# Patient Record
Sex: Male | Born: 1945 | Race: White | Hispanic: No | Smoking: Former smoker
Health system: Southern US, Community
[De-identification: ages and names within clinical notes are randomized; demographics above are authoritative.]

## PROBLEM LIST (undated history)

## (undated) DIAGNOSIS — I10 Essential (primary) hypertension: Secondary | ICD-10-CM

## (undated) DIAGNOSIS — E119 Type 2 diabetes mellitus without complications: Secondary | ICD-10-CM

## (undated) DIAGNOSIS — E785 Hyperlipidemia, unspecified: Secondary | ICD-10-CM

## (undated) DIAGNOSIS — F32A Depression, unspecified: Secondary | ICD-10-CM

## (undated) DIAGNOSIS — Z7901 Long term (current) use of anticoagulants: Secondary | ICD-10-CM

## (undated) DIAGNOSIS — E11621 Type 2 diabetes mellitus with foot ulcer: Secondary | ICD-10-CM

## (undated) DIAGNOSIS — I509 Heart failure, unspecified: Secondary | ICD-10-CM

## (undated) DIAGNOSIS — D126 Benign neoplasm of colon, unspecified: Secondary | ICD-10-CM

## (undated) DIAGNOSIS — G2581 Restless legs syndrome: Secondary | ICD-10-CM

## (undated) DIAGNOSIS — F4310 Post-traumatic stress disorder, unspecified: Secondary | ICD-10-CM

## (undated) DIAGNOSIS — G459 Transient cerebral ischemic attack, unspecified: Secondary | ICD-10-CM

## (undated) DIAGNOSIS — N400 Enlarged prostate without lower urinary tract symptoms: Secondary | ICD-10-CM

## (undated) DIAGNOSIS — I4891 Unspecified atrial fibrillation: Secondary | ICD-10-CM

## (undated) DIAGNOSIS — Z7902 Long term (current) use of antithrombotics/antiplatelets: Secondary | ICD-10-CM

## (undated) DIAGNOSIS — I639 Cerebral infarction, unspecified: Secondary | ICD-10-CM

## (undated) DIAGNOSIS — F329 Major depressive disorder, single episode, unspecified: Secondary | ICD-10-CM

## (undated) DIAGNOSIS — Z794 Long term (current) use of insulin: Secondary | ICD-10-CM

## (undated) DIAGNOSIS — G629 Polyneuropathy, unspecified: Secondary | ICD-10-CM

## (undated) HISTORY — PX: SHOULDER ARTHROSCOPY: SHX128

## (undated) HISTORY — DX: Cerebral infarction, unspecified: I63.9

## (undated) HISTORY — PX: BYPASS GRAFT: SHX909

## (undated) HISTORY — PX: TRIGGER FINGER RELEASE: SHX641

## (undated) HISTORY — DX: Polyneuropathy, unspecified: G62.9

## (undated) HISTORY — DX: Post-traumatic stress disorder, unspecified: F43.10

## (undated) HISTORY — PX: CORONARY ANGIOPLASTY: SHX604

## (undated) HISTORY — PX: UVULOPALATOPHARYNGOPLASTY, TONSILLECTOMY AND SEPTOPLASTY: SHX2632

---

## 2013-05-01 ENCOUNTER — Ambulatory Visit: Payer: Self-pay | Admitting: Pain Medicine

## 2013-05-15 ENCOUNTER — Ambulatory Visit: Payer: Self-pay | Admitting: Pain Medicine

## 2015-11-13 ENCOUNTER — Emergency Department: Payer: Medicare Other

## 2015-11-13 ENCOUNTER — Encounter: Payer: Self-pay | Admitting: Emergency Medicine

## 2015-11-13 ENCOUNTER — Emergency Department
Admission: EM | Admit: 2015-11-13 | Discharge: 2015-11-13 | Disposition: A | Discharge status: Home / Self Care | Payer: Medicare Other | Attending: Emergency Medicine | Admitting: Emergency Medicine

## 2015-11-13 DIAGNOSIS — R079 Chest pain, unspecified: Secondary | ICD-10-CM | POA: Diagnosis present

## 2015-11-13 DIAGNOSIS — Z794 Long term (current) use of insulin: Secondary | ICD-10-CM | POA: Insufficient documentation

## 2015-11-13 DIAGNOSIS — Z7982 Long term (current) use of aspirin: Secondary | ICD-10-CM | POA: Insufficient documentation

## 2015-11-13 DIAGNOSIS — J9811 Atelectasis: Secondary | ICD-10-CM | POA: Insufficient documentation

## 2015-11-13 DIAGNOSIS — E119 Type 2 diabetes mellitus without complications: Secondary | ICD-10-CM | POA: Diagnosis not present

## 2015-11-13 DIAGNOSIS — Z7984 Long term (current) use of oral hypoglycemic drugs: Secondary | ICD-10-CM | POA: Diagnosis not present

## 2015-11-13 DIAGNOSIS — Z79899 Other long term (current) drug therapy: Secondary | ICD-10-CM | POA: Insufficient documentation

## 2015-11-13 DIAGNOSIS — R51 Headache: Secondary | ICD-10-CM | POA: Diagnosis not present

## 2015-11-13 DIAGNOSIS — Z7952 Long term (current) use of systemic steroids: Secondary | ICD-10-CM | POA: Insufficient documentation

## 2015-11-13 DIAGNOSIS — I639 Cerebral infarction, unspecified: Secondary | ICD-10-CM

## 2015-11-13 DIAGNOSIS — I10 Essential (primary) hypertension: Secondary | ICD-10-CM | POA: Diagnosis not present

## 2015-11-13 DIAGNOSIS — R519 Headache, unspecified: Secondary | ICD-10-CM

## 2015-11-13 HISTORY — DX: Cerebral infarction, unspecified: I63.9

## 2015-11-13 HISTORY — DX: Essential (primary) hypertension: I10

## 2015-11-13 HISTORY — DX: Type 2 diabetes mellitus without complications: E11.9

## 2015-11-13 LAB — URINALYSIS COMPLETE WITH MICROSCOPIC (ARMC ONLY)
Bilirubin Urine: NEGATIVE
Glucose, UA: >500 mg/dL — AB
HGB URINE DIPSTICK: NEGATIVE
LEUKOCYTES UA: NEGATIVE
NITRITE: NEGATIVE
PH: 5 (ref 5.0–8.0)
PROTEIN: 100 mg/dL — AB
SPECIFIC GRAVITY, URINE: 1.03 (ref 1.005–1.030)
Squamous Epithelial / LPF: NONE SEEN

## 2015-11-13 LAB — BASIC METABOLIC PANEL
ANION GAP: 12 (ref 5–15)
BUN: 21 mg/dL — ABNORMAL HIGH (ref 6–20)
CHLORIDE: 108 mmol/L (ref 101–111)
CO2: 20 mmol/L — ABNORMAL LOW (ref 22–32)
Calcium: 9.5 mg/dL (ref 8.9–10.3)
Creatinine, Ser: 1.01 mg/dL (ref 0.61–1.24)
GFR calc Af Amer: >60 mL/min (ref >60)
Glucose, Bld: 228 mg/dL — ABNORMAL HIGH (ref 65–99)
POTASSIUM: 4 mmol/L (ref 3.5–5.1)
SODIUM: 140 mmol/L (ref 135–145)

## 2015-11-13 LAB — TROPONIN I: Troponin I: <0.03 ng/mL (ref <0.031)

## 2015-11-13 LAB — CBC
HEMATOCRIT: 41.7 % (ref 40.0–52.0)
HEMOGLOBIN: 13.9 g/dL (ref 13.0–18.0)
MCH: 29.5 pg (ref 26.0–34.0)
MCHC: 33.3 g/dL (ref 32.0–36.0)
MCV: 88.6 fL (ref 80.0–100.0)
Platelets: 160 10*3/uL (ref 150–440)
RBC: 4.71 MIL/uL (ref 4.40–5.90)
RDW: 13.7 % (ref 11.5–14.5)
WBC: 7.9 10*3/uL (ref 3.8–10.6)

## 2015-11-13 LAB — SEDIMENTATION RATE: Sed Rate: 9 mm/hr (ref 0–20)

## 2015-11-13 LAB — GLUCOSE, CAPILLARY: Glucose-Capillary: 192 mg/dL — ABNORMAL HIGH (ref 65–99)

## 2015-11-13 MED ORDER — SODIUM CHLORIDE 0.9 % IV BOLUS (SEPSIS)
1000.0000 mL | Freq: Once | INTRAVENOUS | Status: AC
  Administered 2015-11-13: 1000 mL via INTRAVENOUS

## 2015-11-13 MED ORDER — BUTALBITAL-APAP-CAFFEINE 50-325-40 MG PO TABS: 1.0000 | ORAL_TABLET | Freq: Four times a day (QID) | ORAL | Status: DC | PRN

## 2015-11-13 MED ORDER — PROCHLORPERAZINE EDISYLATE 5 MG/ML IJ SOLN
10.0000 mg | Freq: Once | INTRAMUSCULAR | Status: AC
  Administered 2015-11-13: 10 mg via INTRAVENOUS
  Filled 2015-11-13: qty 2

## 2015-11-13 NOTE — ED Provider Notes (Addendum)
[facility] Emergency Department Provider Note  ____________________________________________  Time seen: Approximately [id number] PM  I have reviewed the triage vital signs and the nursing notes.   HISTORY  Chief Complaint Chest Pain    HPI [name] is a 69 y.o. male with a history of diabetes and hypertension who is presenting today with a headache and chest pain over the last 3 days. He says that his chest pain is intermittent and is only there when he leans back or lies on his right side. He denies any nausea, vomiting sweating or shortness of breath. He says that when he gets to walk around he has no chest pain or shortness of breath. He is also able to take a deep breath without any chest pain or shortness of breath. He says the headache has been slowly increasing over the past 3 days and is associated with unsteadiness on his feet when standing. He also says he is "blurred vision." He describes the headache is frontal and dull. He denies any photophobia or phonophobia but does say that he has increased ringing in his ears. He said that he had similar headaches 3 years ago and they resolved on her own. He said that he had an extensive workup at the [facility] for this at the time. He said that he did not take any medications for any pain relief. Denies any cardiac history.Patient denies any cough or runny nose or fever.   Past Medical History  Diagnosis Date  . Diabetes mellitus without complication ([place])   . Hypertension     There are no active problems to display for this patient.   No past surgical history on file.  Current Outpatient Rx  Name  Route  Sig  Dispense  Refill  . aspirin 81 MG tablet   Oral   Take 81 mg by mouth daily.         . cholecalciferol (VITAMIN D) 1000 UNITS tablet   Oral   Take 1,000 Units by mouth daily.         . finasteride (PROSCAR) 5 MG tablet   Oral   Take 5 mg by mouth daily.         . insulin  aspart (NOVOLOG) 100 UNIT/ML injection   Subcutaneous   Inject 12 Units into the skin every morning. 12 units every morning, 7 nits every afternoon, and 15 units at bedtime         . insulin glargine (LANTUS) 100 UNIT/ML injection   Subcutaneous   Inject 25 Units into the skin 2 (two) times daily. 25 every morning and 25 at night         . metFORMIN (GLUCOPHAGE) 850 MG tablet   Oral   Take 850 mg by mouth 3 (three) times daily.         Marland Kitchen PARoxetine (PAXIL) 40 MG tablet   Oral   Take 40 mg by mouth every morning.         . prazosin (MINIPRESS) 5 MG capsule   Oral   Take 5 mg by mouth daily.         . tamsulosin (FLOMAX) 0.4 MG CAPS capsule   Oral   Take 0.4 mg by mouth daily.         . Vitamin D, Ergocalciferol, (DRISDOL) 50000 UNITS CAPS capsule   Oral   Take 50,000 Units by mouth every 7 (seven) days.           Allergies Review  of patient's allergies indicates no known allergies.  No family history on file.  Social History Social History  Substance Use Topics  . Smoking status: Never Smoker   . Smokeless tobacco: None  . Alcohol Use: No    Review of Systems Constitutional: No fever/chills Eyes: No visual changes. ENT: No sore throat. Cardiovascular: As above  Respiratory: Denies shortness of breath. Gastrointestinal: No abdominal pain.  No nausea, no vomiting.  No diarrhea.  No constipation. Genitourinary: Negative for dysuria. Musculoskeletal: Negative for back pain. Skin: Negative for rash. Neurological: Negative for focal weakness or numbness.  10-point ROS otherwise negative.  ____________________________________________   PHYSICAL EXAM:  VITAL SIGNS: ED Triage Vitals  Enc Vitals Group     BP 11/13/15 1752 115/58 mmHg     Pulse Rate 11/13/15 1752 106     Resp 11/13/15 1752 16     Temp 11/13/15 1752 98.2 F (36.8 C)     Temp Source 11/13/15 1752 Oral     SpO2 11/13/15 1752 96 %     Weight 11/13/15 1752 229 lb (103.874 kg)      Height 11/13/15 1752 6' (1.829 m)     Head Cir --      Peak Flow --      Pain Score 11/13/15 1753 8     Pain Loc --      Pain Edu? --      Excl. in [place]? --     Constitutional: Alert and oriented. Well appearing and in no acute distress. Eyes: Conjunctivae are normal. PERRL. EOMI. Head: Atraumatic. Normal TMs bilaterally. No tenderness along the distribution of the temporal artery. No tenderness over the sinuses. Nose: No congestion/rhinnorhea. Mouth/Throat: Mucous membranes are moist.  Oropharynx non-erythematous. Neck: No stridor.   Cardiovascular: Normal rate, regular rhythm. Grossly normal heart sounds.  Good peripheral circulation. Mild pain to the right side of the chest as well as the center of the chest is reproducible to palpation. Respiratory: Normal respiratory effort.  No retractions. Lungs CTAB. Gastrointestinal: Soft and nontender. No distention. No abdominal bruits. No CVA tenderness. Musculoskeletal: No lower extremity tenderness nor edema.  No joint effusions. Neurologic:  Normal speech and language. No gross focal neurologic deficits are appreciated. Walks with a mild shuffled gait without assistance. No ataxia on finger to nose testing. Skin:  Skin is warm, dry and intact. No rash noted. Psychiatric: Mood and affect are normal. Speech and behavior are normal.  ____________________________________________   LABS (all labs ordered are listed, but only abnormal results are displayed)  Labs Reviewed  BASIC METABOLIC PANEL - Abnormal; Notable for the following:    CO2 20 (*)    Glucose, Bld 228 (*)    BUN 21 (*)    All other components within normal limits  URINALYSIS COMPLETEWITH MICROSCOPIC ([facility] ONLY) - Abnormal; Notable for the following:    Color, Urine YELLOW (*)    APPearance CLEAR (*)    Glucose, UA >500 (*)    Ketones, ur TRACE (*)    Protein, ur 100 (*)    Bacteria, UA RARE (*)    All other components within normal limits  GLUCOSE, CAPILLARY -  Abnormal; Notable for the following:    Glucose-Capillary 192 (*)    All other components within normal limits  TROPONIN I  CBC  TROPONIN I  SEDIMENTATION RATE   ____________________________________________  EKG  ED ECG REPORT I, Doran Stabler, the attending physician, personally viewed and interpreted this ECG.   Date:  11/13/2015  EKG Time: 1754  Rate: 101  Rhythm: sinus tachycardia  Axis: Normal axis  Intervals:none  ST&T Change: No ST elevation or depression. No abnormal T-wave inversions.  ____________________________________________  RADIOLOGY  No acute intracranial pathology on CT of the brain. 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 Chest x-ray with right middle lobe pneumonia or subsegmental atelectasis. ____________________________________________   PROCEDURES   ____________________________________________   INITIAL IMPRESSION / ASSESSMENT AND PLAN / ED COURSE  Pertinent labs & imaging results that were available during my care of the patient were reviewed by me and considered in my medical decision making (see chart for details).  Less likely to be pneumonia in this patient. He has no symptoms of pneumonia including any coughing, fever or shortness of breath.  ----------------------------------------- 11:13 PM on 11/13/2015 -----------------------------------------  Patient with relief of symptoms after fluids and Compazine. He was able to ambulate without any difficulty without assistance. He says his vision has now normalized. He says he is now headache free. He also says that he is aware of the old stroke on his CAT scan. Furthermore we discussed his atelectasis versus pneumonia on the right. He will be discharged with an incentive sprout her. I doubt pneumonia at this time because of the lack of clinical features. He does not any shortness of breath cough or fever. Also his heart rate is now in the 70s. Unlikely to be pulmonary embolus or  cardiac chest pain. 2 negative troponins and his EKG did not show ischemic change. The patient will follow-up with his primary care doctor at the [facility]. He'll be discharged with Fioricet. The patient understands return precautions including any worsening or concerning symptoms. His son was also at the bedside and his son says that his father's acting at his normal baseline at this time. Also, the patient is denying any chest pain at this time. ____________________________________________   FINAL CLINICAL IMPRESSION(S) / ED DIAGNOSES  Acute headache. Right-sided atelectasis. Acute chest pain.    Orbie Pyo, MD 11/13/15 2315  Patient called into the emergency department requesting a refill of his Fioricet. He says that he has been taking it as needed at home and still has 8 left but was not able to get an appointment at the [facility] until after Thanksgiving. Is a point will either be today after things getting with the Monday after Thanksgiving. He is concerned that he may run out of his fears between now and then. I told him that I be happy to refill his Fioricet LIMA prescription here in the emergency department. He says he will be able to pick it up and 20 minutes to half an hour. He says that with the medicine has headaches have been greatly improved. I did discuss them weaning himself down from the [place] that and he says he is alert he try and do this. He says he has not taken any medicine yet today and when he takes that he has taken only as prescribed.  Orbie Pyo, MD 11/16/15 [phone]

## 2015-11-13 NOTE — ED Notes (Addendum)
Pt reports chest pain that started 3 days ago, reports tightness when sitting or lying down that radiates through to back. Denies nausea, vomiting, diaphoresis or shortness of breath. Pt reports high CBG today.

## 2015-11-16 MED ORDER — BUTALBITAL-APAP-CAFFEINE 50-325-40 MG PO TABS: 1.0000 | ORAL_TABLET | Freq: Four times a day (QID) | ORAL | Status: AC | PRN

## 2015-12-01 DIAGNOSIS — Z9189 Other specified personal risk factors, not elsewhere classified: Secondary | ICD-10-CM | POA: Insufficient documentation

## 2016-02-05 ENCOUNTER — Emergency Department: Payer: Medicare Other

## 2016-02-05 ENCOUNTER — Emergency Department
Admission: EM | Admit: 2016-02-05 | Discharge: 2016-02-05 | Disposition: A | Discharge status: Home / Self Care | Payer: Medicare Other | Attending: Emergency Medicine | Admitting: Emergency Medicine

## 2016-02-05 ENCOUNTER — Encounter: Payer: Self-pay | Admitting: Emergency Medicine

## 2016-02-05 DIAGNOSIS — Z79899 Other long term (current) drug therapy: Secondary | ICD-10-CM | POA: Insufficient documentation

## 2016-02-05 DIAGNOSIS — Z7982 Long term (current) use of aspirin: Secondary | ICD-10-CM | POA: Diagnosis not present

## 2016-02-05 DIAGNOSIS — Z7984 Long term (current) use of oral hypoglycemic drugs: Secondary | ICD-10-CM | POA: Diagnosis not present

## 2016-02-05 DIAGNOSIS — R079 Chest pain, unspecified: Secondary | ICD-10-CM | POA: Diagnosis present

## 2016-02-05 DIAGNOSIS — Z794 Long term (current) use of insulin: Secondary | ICD-10-CM | POA: Diagnosis not present

## 2016-02-05 DIAGNOSIS — K2970 Gastritis, unspecified, without bleeding: Secondary | ICD-10-CM | POA: Insufficient documentation

## 2016-02-05 DIAGNOSIS — E119 Type 2 diabetes mellitus without complications: Secondary | ICD-10-CM | POA: Insufficient documentation

## 2016-02-05 DIAGNOSIS — I10 Essential (primary) hypertension: Secondary | ICD-10-CM | POA: Diagnosis not present

## 2016-02-05 LAB — CBC
HCT: 41.9 % (ref 40.0–52.0)
HEMOGLOBIN: 14.4 g/dL (ref 13.0–18.0)
MCH: 30 pg (ref 26.0–34.0)
MCHC: 34.5 g/dL (ref 32.0–36.0)
MCV: 87.1 fL (ref 80.0–100.0)
PLATELETS: 167 10*3/uL (ref 150–440)
RBC: 4.81 MIL/uL (ref 4.40–5.90)
RDW: 13.7 % (ref 11.5–14.5)
WBC: 8.5 10*3/uL (ref 3.8–10.6)

## 2016-02-05 LAB — BASIC METABOLIC PANEL
ANION GAP: 9 (ref 5–15)
BUN: 20 mg/dL (ref 6–20)
CALCIUM: 9.1 mg/dL (ref 8.9–10.3)
CO2: 22 mmol/L (ref 22–32)
Chloride: 107 mmol/L (ref 101–111)
Creatinine, Ser: 0.9 mg/dL (ref 0.61–1.24)
Glucose, Bld: 290 mg/dL — ABNORMAL HIGH (ref 65–99)
Potassium: 4 mmol/L (ref 3.5–5.1)
SODIUM: 138 mmol/L (ref 135–145)

## 2016-02-05 LAB — TROPONIN I

## 2016-02-05 MED ORDER — GI COCKTAIL ~~LOC~~
30.0000 mL | Freq: Once | ORAL | Status: AC
  Administered 2016-02-05: 30 mL via ORAL
  Filled 2016-02-05: qty 30

## 2016-02-05 MED ORDER — SUCRALFATE 1 G PO TABS: 1.0000 g | ORAL_TABLET | Freq: Four times a day (QID) | ORAL | Status: DC

## 2016-02-05 MED ORDER — RANITIDINE HCL 150 MG PO TABS: 150.0000 mg | ORAL_TABLET | Freq: Two times a day (BID) | ORAL | Status: DC

## 2016-02-05 NOTE — Discharge Instructions (Signed)
Please seek medical attention for any high fevers, chest pain, shortness of breath, change in behavior, persistent vomiting, bloody stool or any other new or concerning symptoms.   Gastritis, Adult Gastritis is soreness and puffiness (inflammation) of the lining of the stomach. If you do not get help, gastritis can cause bleeding and sores (ulcers) in the stomach. HOME CARE   Only take medicine as told by your doctor.  If you were given antibiotic medicines, take them as told. Finish the medicines even if you start to feel better.  Drink enough fluids to keep your pee (urine) clear or pale yellow.  Avoid foods and drinks that make your problems worse. Foods you may want to avoid include:  Caffeine or alcohol.  Chocolate.  Mint.  Garlic and onions.  Spicy foods.  Citrus fruits, including oranges, lemons, or limes.  Food containing tomatoes, including sauce, chili, salsa, and pizza.  Fried and fatty foods.  Eat small meals throughout the day instead of large meals. GET HELP RIGHT AWAY IF:   You have black or dark red poop (stools).  You throw up (vomit) blood. It may look like coffee grounds.  You cannot keep fluids down.  Your belly (abdominal) pain gets worse.  You have a fever.  You do not feel better after 1 week.  You have any other questions or concerns. MAKE SURE YOU:   Understand these instructions.  Will watch your condition.  Will get help right away if you are not doing well or get worse.   This information is not intended to replace advice given to you by your health care provider. Make sure you discuss any questions you have with your health care provider.   Document Released: 06/01/2008 Document Revised: 03/07/2012 Document Reviewed: 01/27/2012 Elsevier Interactive Patient Education 2016 Else[employer]  Nonspecific Chest Pain It is often hard to find the cause of chest pain. There is always a chance that your pain could be related to something  serious, such as a heart attack or a blood clot in your lungs. Chest pain can also be caused by conditions that are not life-threatening. If you have chest pain, it is very important to follow up with your doctor.  HOME CARE  If you were prescribed an antibiotic medicine, finish it all even if you start to feel better.  Avoid any activities that cause chest pain.  Do not use any tobacco products, including cigarettes, chewing tobacco, or electronic cigarettes. If you need help quitting, ask your doctor.  Do not drink alcohol.  Take medicines only as told by your doctor.  Keep all follow-up visits as told by your doctor. This is important. This includes any further testing if your chest pain does not go away.  Your doctor may tell you to keep your head raised (elevated) while you sleep.  Make lifestyle changes as told by your doctor. These may include:  Getting regular exercise. Ask your doctor to suggest some activities that are safe for you.  Eating a heart-healthy diet. Your doctor or a diet specialist (dietitian) can help you to learn healthy eating options.  Maintaining a healthy weight.  Managing diabetes, if necessary.  Reducing stress. GET HELP IF:  Your chest pain does not go away, even after treatment.  You have a rash with blisters on your chest.  You have a fever. GET HELP RIGHT AWAY IF:  Your chest pain is worse.  You have an increasing cough, or you cough up blood.  You have  severe belly (abdominal) pain.  You feel extremely weak.  You pass out (faint).  You have chills.  You have sudden, unexplained chest discomfort.  You have sudden, unexplained discomfort in your arms, back, neck, or jaw.  You have shortness of breath at any time.  You suddenly start to sweat, or your skin gets clammy.  You feel nauseous.  You vomit.  You suddenly feel light-headed or dizzy.  Your heart begins to beat quickly, or it feels like it is skipping  beats. These symptoms may be an emergency. Do not wait to see if the symptoms will go away. Get medical help right away. Call your local emergency services (911 in the [place] Do not drive yourself to the hospital.   This information is not intended to replace advice given to you by your health care provider. Make sure you discuss any questions you have with your health care provider.   Document Released: 06/01/2008 Document Revised: 01/04/2015 Document Reviewed: 07/20/2014 Elsevier Interactive Patient Education [employer].

## 2016-02-05 NOTE — ED Notes (Signed)
Pt denies any pain at this time.

## 2016-02-05 NOTE — ED Notes (Signed)
Pt reports Chest tightness but no pin starting suddenly this evening. Pt has had similar episode that resolved on its own

## 2016-02-05 NOTE — ED Provider Notes (Signed)
[facility] Emergency Department Provider Note    ____________________________________________  Time seen: ~1610  I have reviewed the triage vital signs and the nursing notes.   HISTORY  Chief Complaint Chest Pain   History limited by: Not Limited   HPI [name] is a 70 y.o. male who presents to the emergency department today because of concerns for intermittent chest pain. He states that he first started having a 2-3 weeks ago. He describes it as a very small pressure type discomfort. It is located in the central upper chest. States it has been accompanied by burping. He denies any complaint shortness of breath. He states that stress will induce the pain. Denies any worsening of pain with exertion. Denies any radiation of pain into his neck shoulders or back. Denies any fevers. No cough.     Past Medical History  Diagnosis Date  . Diabetes mellitus without complication ([place])   . Hypertension     There are no active problems to display for this patient.   History reviewed. No pertinent past surgical history.  Current Outpatient Rx  Name  Route  Sig  Dispense  Refill  . aspirin 81 MG tablet   Oral   Take 81 mg by mouth daily.         . butalbital-acetaminophen-caffeine (FIORICET) 50-325-40 MG tablet   Oral   Take 1-2 tablets by mouth every 6 (six) hours as needed for headache.   20 tablet   0   . butalbital-acetaminophen-caffeine (FIORICET) 50-325-40 MG tablet   Oral   Take 1-2 tablets by mouth every 6 (six) hours as needed for headache.   20 tablet   0   . cholecalciferol (VITAMIN D) 1000 UNITS tablet   Oral   Take 1,000 Units by mouth daily.         . finasteride (PROSCAR) 5 MG tablet   Oral   Take 5 mg by mouth daily.         . insulin aspart (NOVOLOG) 100 UNIT/ML injection   Subcutaneous   Inject 12 Units into the skin every morning. 12 units every morning, 7 nits every afternoon, and 15 units at bedtime          . insulin glargine (LANTUS) 100 UNIT/ML injection   Subcutaneous   Inject 25 Units into the skin 2 (two) times daily. 25 every morning and 25 at night         . metFORMIN (GLUCOPHAGE) 850 MG tablet   Oral   Take 850 mg by mouth 3 (three) times daily.         Marland Kitchen PARoxetine (PAXIL) 40 MG tablet   Oral   Take 40 mg by mouth every morning.         . prazosin (MINIPRESS) 5 MG capsule   Oral   Take 5 mg by mouth daily.         . tamsulosin (FLOMAX) 0.4 MG CAPS capsule   Oral   Take 0.4 mg by mouth daily.         . Vitamin D, Ergocalciferol, (DRISDOL) 50000 UNITS CAPS capsule   Oral   Take 50,000 Units by mouth every 7 (seven) days.           Allergies Review of patient's allergies indicates no known allergies.  No family history on file.  Social History Social History  Substance Use Topics  . Smoking status: Never Smoker   . Smokeless tobacco: None  . Alcohol Use: No  Review of Systems  Constitutional: Negative for fever. Cardiovascular: Negative for chest pain. Respiratory: Negative for shortness of breath. Gastrointestinal: Negative for abdominal pain, vomiting and diarrhea. Neurological: Negative for headaches, focal weakness or numbness.  10-point ROS otherwise negative.  ____________________________________________   PHYSICAL EXAM:  VITAL SIGNS: ED Triage Vitals  Enc Vitals Group     BP 02/05/16 1316 144/65 mmHg     Pulse Rate 02/05/16 1316 97     Resp 02/05/16 1316 16     Temp 02/05/16 1316 97.9 F (36.6 C)     Temp Source 02/05/16 1316 Oral     SpO2 02/05/16 1316 96 %     Weight 02/05/16 1316 225 lb (102.059 kg)     Height 02/05/16 1316 6' (1.829 m)     Head Cir --      Peak Flow --      Pain Score 02/05/16 1317 6   Constitutional: Alert and oriented. Well appearing and in no distress. Eyes: Conjunctivae are normal. PERRL. Normal extraocular movements. ENT   Head: Normocephalic and atraumatic.   Nose: No  congestion/rhinnorhea.   Mouth/Throat: Mucous membranes are moist.   Neck: No stridor. Hematological/Lymphatic/Immunilogical: No cervical lymphadenopathy. Cardiovascular: Normal rate, regular rhythm.  No murmurs, rubs, or gallops. Respiratory: Normal respiratory effort without tachypnea nor retractions. Breath sounds are clear and equal bilaterally. No wheezes/rales/rhonchi. Gastrointestinal: Soft and nontender. No distention.  Genitourinary: Deferred Musculoskeletal: Normal range of motion in all extremities. No joint effusions.  No lower extremity tenderness nor edema. Neurologic:  Normal speech and language. No gross focal neurologic deficits are appreciated.  Skin:  Skin is warm, dry and intact. No rash noted. Psychiatric: Mood and affect are normal. Speech and behavior are normal. Patient exhibits appropriate insight and judgment.  ____________________________________________    LABS (pertinent positives/negatives)  Labs Reviewed  BASIC METABOLIC PANEL - Abnormal; Notable for the following:    Glucose, Bld 290 (*)    All other components within normal limits  CBC  TROPONIN I     ____________________________________________   EKG  I, Nance Pear, attending physician, personally viewed and interpreted this EKG  EKG Time: 1311 Rate: 98 Rhythm: normal sinus rhythm Axis: normal Intervals: qtc 518 QRS: narrow ST changes: no st elevation Impression: prolonged qt, abnormal ekg  ____________________________________________    RADIOLOGY  CXR IMPRESSION: No acute disease.  ___________________________________________   PROCEDURES  Procedure(s) performed: None  Critical Care performed: No  ____________________________________________   INITIAL IMPRESSION / ASSESSMENT AND PLAN / ED COURSE  Pertinent labs & imaging results that were available during my care of the patient were reviewed by me and considered in my medical decision making (see chart for  details).  Patient presented to the emergency department today because of concerns for some chest pain. It is been going on for 2-3 weeks and is brought on by stress. Patient's EKG and blood work without any concerning findings. I think it is a poor story for ACS. I think gastritis more likely given the stress component. Patient states he did feel better after GI cocktail.  ____________________________________________   FINAL CLINICAL IMPRESSION(S) / ED DIAGNOSES  Final diagnoses:  Gastritis  Chest pain, unspecified chest pain type     Nance Pear, MD 02/05/16 1712

## 2016-02-05 NOTE — ED Notes (Signed)
Describes chest tightness x 3 weeks.  Pain is intermittent, feels better when sitting outside in cool air and also after burping patient describes relief.  Patient reports recent home stress with spouse being sick and in rehabilitation to gain strength.

## 2016-03-30 DIAGNOSIS — E559 Vitamin D deficiency, unspecified: Secondary | ICD-10-CM | POA: Insufficient documentation

## 2016-03-30 DIAGNOSIS — E538 Deficiency of other specified B group vitamins: Secondary | ICD-10-CM | POA: Insufficient documentation

## 2016-06-27 ENCOUNTER — Observation Stay: Payer: Medicare Other

## 2016-06-27 ENCOUNTER — Encounter: Payer: Self-pay | Admitting: Emergency Medicine

## 2016-06-27 ENCOUNTER — Inpatient Hospital Stay
Admission: EM | Admit: 2016-06-27 | Discharge: 2016-06-29 | DRG: 065 | Disposition: A | Discharge status: Home Health | Payer: Medicare Other | Attending: Internal Medicine | Admitting: Internal Medicine

## 2016-06-27 DIAGNOSIS — Z833 Family history of diabetes mellitus: Secondary | ICD-10-CM

## 2016-06-27 DIAGNOSIS — E119 Type 2 diabetes mellitus without complications: Secondary | ICD-10-CM

## 2016-06-27 DIAGNOSIS — E1165 Type 2 diabetes mellitus with hyperglycemia: Secondary | ICD-10-CM | POA: Diagnosis present

## 2016-06-27 DIAGNOSIS — I639 Cerebral infarction, unspecified: Secondary | ICD-10-CM | POA: Diagnosis not present

## 2016-06-27 DIAGNOSIS — N179 Acute kidney failure, unspecified: Secondary | ICD-10-CM | POA: Diagnosis present

## 2016-06-27 DIAGNOSIS — R7989 Other specified abnormal findings of blood chemistry: Secondary | ICD-10-CM | POA: Diagnosis not present

## 2016-06-27 DIAGNOSIS — Z7982 Long term (current) use of aspirin: Secondary | ICD-10-CM

## 2016-06-27 DIAGNOSIS — Z794 Long term (current) use of insulin: Secondary | ICD-10-CM

## 2016-06-27 DIAGNOSIS — N400 Enlarged prostate without lower urinary tract symptoms: Secondary | ICD-10-CM | POA: Diagnosis present

## 2016-06-27 DIAGNOSIS — Z8249 Family history of ischemic heart disease and other diseases of the circulatory system: Secondary | ICD-10-CM

## 2016-06-27 DIAGNOSIS — R200 Anesthesia of skin: Secondary | ICD-10-CM | POA: Diagnosis present

## 2016-06-27 DIAGNOSIS — R778 Other specified abnormalities of plasma proteins: Secondary | ICD-10-CM | POA: Diagnosis present

## 2016-06-27 DIAGNOSIS — E785 Hyperlipidemia, unspecified: Secondary | ICD-10-CM | POA: Diagnosis present

## 2016-06-27 DIAGNOSIS — I10 Essential (primary) hypertension: Secondary | ICD-10-CM | POA: Diagnosis present

## 2016-06-27 HISTORY — DX: Depression, unspecified: F32.A

## 2016-06-27 HISTORY — DX: Hyperlipidemia, unspecified: E78.5

## 2016-06-27 HISTORY — DX: Benign prostatic hyperplasia without lower urinary tract symptoms: N40.0

## 2016-06-27 HISTORY — DX: Major depressive disorder, single episode, unspecified: F32.9

## 2016-06-27 LAB — URINALYSIS COMPLETE WITH MICROSCOPIC (ARMC ONLY)
BACTERIA UA: NONE SEEN
BILIRUBIN URINE: NEGATIVE
Hgb urine dipstick: NEGATIVE
Leukocytes, UA: NEGATIVE
NITRITE: NEGATIVE
Protein, ur: NEGATIVE mg/dL
RBC / HPF: NONE SEEN RBC/hpf (ref 0–5)
SPECIFIC GRAVITY, URINE: 1.022 (ref 1.005–1.030)
Squamous Epithelial / LPF: NONE SEEN
pH: 5 (ref 5.0–8.0)

## 2016-06-27 LAB — CBC
HCT: 39.1 % — ABNORMAL LOW (ref 40.0–52.0)
Hemoglobin: 13.3 g/dL (ref 13.0–18.0)
MCH: 30 pg (ref 26.0–34.0)
MCHC: 33.9 g/dL (ref 32.0–36.0)
MCV: 88.4 fL (ref 80.0–100.0)
PLATELETS: 137 10*3/uL — AB (ref 150–440)
RBC: 4.42 MIL/uL (ref 4.40–5.90)
RDW: 13 % (ref 11.5–14.5)
WBC: 9.1 10*3/uL (ref 3.8–10.6)

## 2016-06-27 LAB — TROPONIN I
TROPONIN I: 0.05 ng/mL — AB (ref <0.03)
TROPONIN I: 0.04 ng/mL — AB (ref <0.03)

## 2016-06-27 LAB — BASIC METABOLIC PANEL
Anion gap: 13 (ref 5–15)
BUN: 23 mg/dL — AB (ref 6–20)
CALCIUM: 8.6 mg/dL — AB (ref 8.9–10.3)
CO2: 19 mmol/L — ABNORMAL LOW (ref 22–32)
CREATININE: 1.29 mg/dL — AB (ref 0.61–1.24)
Chloride: 101 mmol/L (ref 101–111)
GFR calc Af Amer: >60 mL/min (ref >60)
GFR, EST NON AFRICAN AMERICAN: 54 mL/min — AB (ref >60)
GLUCOSE: 410 mg/dL — AB (ref 65–99)
Potassium: 4.4 mmol/L (ref 3.5–5.1)
SODIUM: 133 mmol/L — AB (ref 135–145)

## 2016-06-27 LAB — GLUCOSE, CAPILLARY: Glucose-Capillary: 395 mg/dL — ABNORMAL HIGH (ref 65–99)

## 2016-06-27 MED ORDER — TAMSULOSIN HCL 0.4 MG PO CAPS
0.4000 mg | ORAL_CAPSULE | Freq: Every day | ORAL | Status: DC
Start: 2016-06-28 — End: 2016-06-29
  Administered 2016-06-28 – 2016-06-29 (×2): 0.4 mg via ORAL
  Filled 2016-06-27 (×2): qty 1

## 2016-06-27 MED ORDER — SODIUM CHLORIDE 0.9% FLUSH
3.0000 mL | Freq: Two times a day (BID) | INTRAVENOUS | Status: DC
  Administered 2016-06-28 (×2): 3 mL via INTRAVENOUS

## 2016-06-27 MED ORDER — SODIUM CHLORIDE 0.9 % IV SOLN
INTRAVENOUS | Status: AC
  Administered 2016-06-27: via INTRAVENOUS

## 2016-06-27 MED ORDER — ASPIRIN EC 81 MG PO TBEC
81.0000 mg | DELAYED_RELEASE_TABLET | Freq: Every day | ORAL | Status: DC
  Administered 2016-06-28 – 2016-06-29 (×2): 81 mg via ORAL
  Filled 2016-06-27 (×2): qty 1

## 2016-06-27 MED ORDER — SIMVASTATIN 40 MG PO TABS
40.0000 mg | ORAL_TABLET | Freq: Every day | ORAL | Status: DC
  Administered 2016-06-27 – 2016-06-28 (×2): 40 mg via ORAL
  Filled 2016-06-27: qty 1

## 2016-06-27 MED ORDER — ASPIRIN 81 MG PO CHEW
324.0000 mg | CHEWABLE_TABLET | Freq: Once | ORAL | Status: AC
  Administered 2016-06-27: 324 mg via ORAL
  Filled 2016-06-27: qty 4

## 2016-06-27 MED ORDER — FINASTERIDE 5 MG PO TABS
5.0000 mg | ORAL_TABLET | Freq: Every day | ORAL | Status: DC
  Administered 2016-06-28 – 2016-06-29 (×2): 5 mg via ORAL
  Filled 2016-06-27 (×2): qty 1

## 2016-06-27 MED ORDER — ROPINIROLE HCL 1 MG PO TABS
0.5000 mg | ORAL_TABLET | Freq: Every day | ORAL | Status: DC
  Administered 2016-06-27 – 2016-06-28 (×2): 0.5 mg via ORAL
  Filled 2016-06-27 (×3): qty 1

## 2016-06-27 MED ORDER — ACETAMINOPHEN 650 MG RE SUPP: 650.0000 mg | Freq: Four times a day (QID) | RECTAL | Status: DC | PRN

## 2016-06-27 MED ORDER — ENOXAPARIN SODIUM 40 MG/0.4ML ~~LOC~~ SOLN
40.0000 mg | SUBCUTANEOUS | Status: DC
  Administered 2016-06-27 – 2016-06-28 (×2): 40 mg via SUBCUTANEOUS
  Filled 2016-06-27 (×2): qty 0.4

## 2016-06-27 MED ORDER — ONDANSETRON HCL 4 MG/2ML IJ SOLN: 4.0000 mg | Freq: Four times a day (QID) | INTRAMUSCULAR | Status: DC | PRN

## 2016-06-27 MED ORDER — ONDANSETRON HCL 4 MG PO TABS: 4.0000 mg | ORAL_TABLET | Freq: Four times a day (QID) | ORAL | Status: DC | PRN

## 2016-06-27 MED ORDER — INSULIN ASPART 100 UNIT/ML ~~LOC~~ SOLN
0.0000 [IU] | Freq: Four times a day (QID) | SUBCUTANEOUS | Status: DC
  Administered 2016-06-28: 7 [IU] via SUBCUTANEOUS
  Administered 2016-06-28: 17:00:00 2 [IU] via SUBCUTANEOUS
  Administered 2016-06-28 (×2): 5 [IU] via SUBCUTANEOUS
  Administered 2016-06-29: 04:00:00 1 [IU] via SUBCUTANEOUS
  Filled 2016-06-27: qty 2
  Filled 2016-06-27: qty 5
  Filled 2016-06-27: qty 1
  Filled 2016-06-27: qty 7
  Filled 2016-06-27: qty 5

## 2016-06-27 MED ORDER — AMITRIPTYLINE HCL 25 MG PO TABS
25.0000 mg | ORAL_TABLET | Freq: Every day | ORAL | Status: DC
  Administered 2016-06-27 – 2016-06-28 (×2): 25 mg via ORAL
  Filled 2016-06-27 (×3): qty 1

## 2016-06-27 MED ORDER — ACETAMINOPHEN 325 MG PO TABS: 650.0000 mg | ORAL_TABLET | Freq: Four times a day (QID) | ORAL | Status: DC | PRN

## 2016-06-27 MED ORDER — SODIUM CHLORIDE 0.9 % IV BOLUS (SEPSIS)
1000.0000 mL | Freq: Once | INTRAVENOUS | Status: AC
  Administered 2016-06-27: 1000 mL via INTRAVENOUS

## 2016-06-27 MED ORDER — LISINOPRIL 10 MG PO TABS
10.0000 mg | ORAL_TABLET | ORAL | Status: DC
  Administered 2016-06-28 – 2016-06-29 (×2): 10 mg via ORAL
  Filled 2016-06-27 (×2): qty 1

## 2016-06-27 NOTE — ED Provider Notes (Signed)
[facility] Emergency Department Provider Note   ____________________________________________  Time seen: On EMS arrival  I have reviewed the triage vital signs and the nursing notes.   HISTORY  Chief Complaint Weakness   History limited by: Not Limited   HPI [name] is a 70 y.o. male history of diabetes and high blood pressure presents to the emergency department today because of concerns for wobbling and feeling of tingling on his left upper and lower extremity.The patient says that he notices symptoms this morning around 4 AM. Abdomen somewhat persistent throughout the day. Earlier in the morning he notices sugars were high so took an extra dose of his blood pressure medication. He decided to call EMS when the symptoms did not resolve. EMS stated that the patient's blood pressure initially was in the 80 systolic. He was started on fluids. Additionally EMS noted blood sugars in the 400s. Patient denies any recent fevers. No chest pain. No nausea or vomiting or diarrhea.    Past Medical History  Diagnosis Date  . Diabetes mellitus without complication ([place])   . Hypertension     There are no active problems to display for this patient.   No past surgical history on file.  Current Outpatient Rx  Name  Route  Sig  Dispense  Refill  . aspirin 81 MG tablet   Oral   Take 81 mg by mouth daily.         . butalbital-acetaminophen-caffeine (FIORICET) 50-325-40 MG tablet   Oral   Take 1-2 tablets by mouth every 6 (six) hours as needed for headache.   20 tablet   0   . butalbital-acetaminophen-caffeine (FIORICET) 50-325-40 MG tablet   Oral   Take 1-2 tablets by mouth every 6 (six) hours as needed for headache.   20 tablet   0   . cholecalciferol (VITAMIN D) 1000 UNITS tablet   Oral   Take 1,000 Units by mouth daily.         . finasteride (PROSCAR) 5 MG tablet   Oral   Take 5 mg by mouth daily.         . insulin aspart  (NOVOLOG) 100 UNIT/ML injection   Subcutaneous   Inject 12 Units into the skin every morning. 12 units every morning, 7 nits every afternoon, and 15 units at bedtime         . insulin glargine (LANTUS) 100 UNIT/ML injection   Subcutaneous   Inject 25 Units into the skin 2 (two) times daily. 25 every morning and 25 at night         . metFORMIN (GLUCOPHAGE) 850 MG tablet   Oral   Take 850 mg by mouth 3 (three) times daily.         Marland Kitchen PARoxetine (PAXIL) 40 MG tablet   Oral   Take 40 mg by mouth every morning.         . prazosin (MINIPRESS) 5 MG capsule   Oral   Take 5 mg by mouth daily.         . ranitidine (ZANTAC) 150 MG tablet   Oral   Take 1 tablet (150 mg total) by mouth 2 (two) times daily.   60 tablet   1   . sucralfate (CARAFATE) 1 g tablet   Oral   Take 1 tablet (1 g total) by mouth 4 (four) times daily.   60 tablet   0   . tamsulosin (FLOMAX) 0.4 MG CAPS capsule   Oral  Take 0.4 mg by mouth daily.         . Vitamin D, Ergocalciferol, (DRISDOL) 50000 UNITS CAPS capsule   Oral   Take 50,000 Units by mouth every 7 (seven) days.           Allergies Review of patient's allergies indicates no known allergies.  No family history on file.  Social History Social History  Substance Use Topics  . Smoking status: Never Smoker   . Smokeless tobacco: Not on file  . Alcohol Use: No    Review of Systems  Constitutional: Negative for fever. Cardiovascular: Negative for chest pain. Respiratory: Negative for shortness of breath. Gastrointestinal: Negative for abdominal pain, vomiting and diarrhea. Genitourinary: Negative for dysuria. Neurological: Negative for headaches, focal weakness or numbness.  10-point ROS otherwise negative.  ____________________________________________   PHYSICAL EXAM:  VITAL SIGNS:    98.7 F (37.1 C)  96  18  128/73 mmHg  94 %    Constitutional: Alert and oriented. Well appearing and in no distress. Eyes:  Conjunctivae are normal. PERRL. Normal extraocular movements. ENT   Head: Normocephalic and atraumatic.   Nose: No congestion/rhinnorhea.   Mouth/Throat: Mucous membranes are moist.   Neck: No stridor. Hematological/Lymphatic/Immunilogical: No cervical lymphadenopathy. Cardiovascular: Normal rate, regular rhythm.  No murmurs, rubs, or gallops. Respiratory: Normal respiratory effort without tachypnea nor retractions. Breath sounds are clear and equal bilaterally. No wheezes/rales/rhonchi. Gastrointestinal: Soft and nontender. No distention.  Genitourinary: Deferred Musculoskeletal: Normal range of motion in all extremities. No joint effusions.  No lower extremity tenderness nor edema. Neurologic:  Normal speech and language. No gross focal neurologic deficits are appreciated.  Skin:  Skin is warm, dry and intact. No rash noted. Psychiatric: Mood and affect are normal. Speech and behavior are normal. Patient exhibits appropriate insight and judgment.  ____________________________________________    LABS (pertinent positives/negatives)  Labs Reviewed  BASIC METABOLIC PANEL - Abnormal; Notable for the following:    Sodium 133 (*)    CO2 19 (*)    Glucose, Bld 410 (*)    BUN 23 (*)    Creatinine, Ser 1.29 (*)    Calcium 8.6 (*)    GFR calc non Af Amer 54 (*)    All other components within normal limits  CBC - Abnormal; Notable for the following:    HCT 39.1 (*)    Platelets 137 (*)    All other components within normal limits  URINALYSIS COMPLETEWITH MICROSCOPIC ([facility] ONLY) - Abnormal; Notable for the following:    Color, Urine YELLOW (*)    APPearance CLEAR (*)    Glucose, UA >500 (*)    Ketones, ur TRACE (*)    All other components within normal limits  TROPONIN I - Abnormal; Notable for the following:    Troponin I 0.05 (*)    All other components within normal limits  CBG MONITORING, ED     ____________________________________________   EKG  I, Nance Pear, attending physician, personally viewed and interpreted this EKG  EKG Time: 1841 Rate: 90 Rhythm: normal sinus rhythm Axis: normal Intervals: qtc 485 QRS: narrow ST changes: no st elevation Impression: abnormal ekg   ____________________________________________    RADIOLOGY  None  ____________________________________________   PROCEDURES  Procedure(s) performed: None  Critical Care performed: No  ____________________________________________   INITIAL IMPRESSION / ASSESSMENT AND PLAN / ED COURSE  Pertinent labs & imaging results that were available during my care of the patient were reviewed by me and considered in my medical  decision making (see chart for details).  Patient presented to the emergency department today via EMS because of feeling wobbly and with some left upper and lower extremity  tingling today. On exam patient is awake, alert no acute distress. EMS did state that he was initially quite hypertensive and hyperglycemic. Patient had good strength in all extremities and sensation was grossly intact. This point I doubt stroke however it is potentially possible. Blood work did show elevated troponin which could point to either CVA or mid of heart disease. Patient does have a history of diabetes and given her blood pressure do have more concerned perhaps this point for ACS. Will plan on discussing with hospitalist for admission and further workup.  ____________________________________________   FINAL CLINICAL IMPRESSION(S) / ED DIAGNOSES  Final diagnoses:  Elevated troponin     Note: This dictation was prepared with Dragon dictation. Any transcriptional errors that result from this process are unintentional    Nance Pear, MD 06/27/16 [phone]

## 2016-06-27 NOTE — Care Management Obs Status (Signed)
[place] NOTIFICATION   Patient Details  Name: [name] MRN: [id number] Date of Birth: [birth year]   Medicare Observation Status Notification Given:  Yes    CrutchfieldAntony Haste, RN 06/27/2016, 9:39 PM

## 2016-06-27 NOTE — ED Notes (Signed)
Patient presents to the ED via [facility] with complaint of weakness.  Patient reports waking up at 4am with weakness and tingling in his left arm and left foot.  Patient reports when he took his blood pressure around9am he noticed that his blood pressure was high and that he seemed to be walking funny.  Patient states this afternoon he checked his blood pressure again and it was low, in the 80s or 90s.  Patient reports so much weakness at the time he called EMS that he was unable to stand or walk.  Patient reports shortness of breath walking stairs earlier today.  Patient reports heavy responsibilities and stress at home.

## 2016-06-27 NOTE — H&P (Signed)
[place] [facility] [place] NAME: [name]    MR#:  [id number]  DATE OF BIRTH:  04-Jan-1946  DATE OF ADMISSION:  06/27/2016  PRIMARY CARE PHYSICIAN: Glendon Axe, MD   REQUESTING/REFERRING PHYSICIAN: Archie Balboa, MD  CHIEF COMPLAINT:   Chief Complaint  Patient presents with  . Weakness    HISTORY OF PRESENT ILLNESS:  [name]  is a 70 y.o. male who presents with Weakness and feeling "off" today. He states that when he woke up this morning he felt strange and he had some tingling sensation in his left arm and some in his left leg. He states that as the day went on and he stood up and walk around he began to feel "wobbly." He decided to come to the ED for evaluation this afternoon and here on initial workup was found to have a mildly elevated troponin at 0.05. He denies any chest pain or shortness of breath, though he does state that he's had somewhat of an intermittent headache throughout the day today. Hospitalists were called for further evaluation and workup as well as admission.  PAST MEDICAL HISTORY:   Past Medical History  Diagnosis Date  . Diabetes mellitus without complication ([place])   . Hypertension   . HLD (hyperlipidemia)   . Depression   . BPH (benign prostatic hyperplasia)     PAST SURGICAL HISTORY:   Past Surgical History  Procedure Laterality Date  . Shoulder arthroscopy      SOCIAL HISTORY:   Social History  Substance Use Topics  . Smoking status: Never Smoker   . Smokeless tobacco: Not on file  . Alcohol Use: No    FAMILY HISTORY:   Family History  Problem Relation Age of Onset  . Hypertension    . Diabetes Mellitus II      DRUG ALLERGIES:  No Known Allergies  MEDICATIONS AT HOME:   Prior to Admission medications   Medication Sig Start Date End Date Taking? Authorizing Provider  amitriptyline (ELAVIL) 25 MG tablet Take 25 mg by mouth at bedtime.   Yes Historical Provider, MD   aspirin 81 MG tablet Take 81 mg by mouth daily.   Yes Historical Provider, MD  cholecalciferol (VITAMIN D) 1000 UNITS tablet Take 1,000 Units by mouth daily.   Yes Historical Provider, MD  finasteride (PROSCAR) 5 MG tablet Take 5 mg by mouth daily.   Yes Historical Provider, MD  insulin aspart (NOVOLOG) 100 UNIT/ML injection Inject 15 Units into the skin every morning.    Yes Historical Provider, MD  insulin glargine (LANTUS) 100 UNIT/ML injection Inject 40 Units into the skin 2 (two) times daily.    Yes Historical Provider, MD  lisinopril (PRINIVIL,ZESTRIL) 10 MG tablet Take 10 mg by mouth every morning.   Yes Historical Provider, MD  metFORMIN (GLUCOPHAGE) 850 MG tablet Take 850 mg by mouth 3 (three) times daily.   Yes Historical Provider, MD  PARoxetine (PAXIL) 40 MG tablet Take 40 mg by mouth every morning.   Yes Historical Provider, MD  prazosin (MINIPRESS) 5 MG capsule Take 5 mg by mouth 2 (two) times daily.    Yes Historical Provider, MD  rOPINIRole (REQUIP) 0.5 MG tablet Take 0.5 mg by mouth at bedtime.   Yes Historical Provider, MD  simvastatin (ZOCOR) 40 MG tablet Take 40 mg by mouth at bedtime.   Yes Historical Provider, MD  tamsulosin (FLOMAX) 0.4 MG CAPS capsule Take 0.4 mg by mouth daily.   Yes Historical  Provider, MD  tiZANidine (ZANAFLEX) 2 MG tablet Take 2 mg by mouth at bedtime.   Yes Historical Provider, MD  vitamin B-12 (CYANOCOBALAMIN) 1000 MCG tablet Take 1,000 mcg by mouth at bedtime.   Yes Historical Provider, MD  butalbital-acetaminophen-caffeine (FIORICET) 50-325-40 MG tablet Take 1-2 tablets by mouth every 6 (six) hours as needed for headache. 11/13/15 11/12/16  Orbie Pyo, MD  butalbital-acetaminophen-caffeine (FIORICET) 514 502 1890 MG tablet Take 1-2 tablets by mouth every 6 (six) hours as needed for headache. 11/16/15 11/15/16  Orbie Pyo, MD  ranitidine (ZANTAC) 150 MG tablet Take 1 tablet (150 mg total) by mouth 2 (two) times daily. 02/05/16  02/04/17  Nance Pear, MD  sucralfate (CARAFATE) 1 g tablet Take 1 tablet (1 g total) by mouth 4 (four) times daily. 02/05/16   Nance Pear, MD    REVIEW OF SYSTEMS:  Review of Systems  Constitutional: Negative for fever, chills, weight loss and malaise/fatigue.  HENT: Negative for ear pain, hearing loss and tinnitus.   Eyes: Negative for blurred vision, double vision, pain and redness.  Respiratory: Negative for cough, hemoptysis and shortness of breath.   Cardiovascular: Negative for chest pain, palpitations, orthopnea and leg swelling.  Gastrointestinal: Negative for nausea, vomiting, abdominal pain, diarrhea and constipation.  Genitourinary: Negative for dysuria, frequency and hematuria.  Musculoskeletal: Negative for back pain, joint pain and neck pain.  Skin:       No acne, rash, or lesions  Neurological: Positive for sensory change. Negative for dizziness, tremors, focal weakness and weakness.  Endo/Heme/Allergies: Negative for polydipsia. Does not bruise/bleed easily.  Psychiatric/Behavioral: Negative for depression. The patient is not nervous/anxious and does not have insomnia.      VITAL SIGNS:   Filed Vitals:   06/27/16 1844 06/27/16 1930 06/27/16 2000 06/27/16 2030  BP: 128/73 113/57 128/66 136/84  Pulse: 96 87 87   Temp: 98.7 F (37.1 C)     TempSrc: Oral     Resp: 18 14 21 20   Height: 6' (1.829 m)     Weight: 101.152 kg (223 lb)     SpO2: 94% 95% 94%    Wt Readings from Last 3 Encounters:  06/27/16 101.152 kg (223 lb)  02/05/16 102.059 kg (225 lb)  11/13/15 103.874 kg (229 lb)    PHYSICAL EXAMINATION:  Physical Exam  Vitals reviewed. Constitutional: He is oriented to person, place, and time. He appears well-developed and well-nourished. No distress.  HENT:  Head: Normocephalic and atraumatic.  Mouth/Throat: Oropharynx is clear and moist.  Eyes: Conjunctivae and EOM are normal. Pupils are equal, round, and reactive to light. No scleral icterus.  Neck:  Normal range of motion. Neck supple. No JVD present. No thyromegaly present.  Cardiovascular: Normal rate, regular rhythm and intact distal pulses.  Exam reveals no gallop and no friction rub.   No murmur heard. Respiratory: Effort normal and breath sounds normal. No respiratory distress. He has no wheezes. He has no rales.  GI: Soft. Bowel sounds are normal. He exhibits no distension. There is no tenderness.  Musculoskeletal: Normal range of motion. He exhibits no edema.  No arthritis, no gout  Lymphadenopathy:    He has no cervical adenopathy.  Neurological: He is alert and oriented to person, place, and time. No cranial nerve deficit.  No dysarthria, no aphasia  Skin: Skin is warm and dry. No rash noted. No erythema.  Psychiatric: He has a normal mood and affect. His behavior is normal. Judgment and thought content normal.  LABORATORY PANEL:   CBC  Recent Labs Lab 06/27/16 1853  WBC 9.1  HGB 13.3  HCT 39.1*  PLT 137*   ------------------------------------------------------------------------------------------------------------------  Chemistries   Recent Labs Lab 06/27/16 1853  NA 133*  K 4.4  CL 101  CO2 19*  GLUCOSE 410*  BUN 23*  CREATININE 1.29*  CALCIUM 8.6*   ------------------------------------------------------------------------------------------------------------------  Cardiac Enzymes  Recent Labs Lab 06/27/16 1853  TROPONINI 0.05*   ------------------------------------------------------------------------------------------------------------------  RADIOLOGY:  No results found.  EKG:   Orders placed or performed during the hospital encounter of 06/27/16  . EKG 12-Lead  . EKG 12-Lead  . ED EKG  . ED EKG    IMPRESSION AND PLAN:  Principal Problem:   Elevated troponin - do not strongly suspect primary ACS. However, we will trend his troponins tonight. He is a diabetic and therefore may be having atypical symptoms. We'll get an  echocardiogram in the morning. If any of his tests become positive or there stronger suspicion for primary cardiac pathology, would consider consulting cardiology at that point. Active Problems:   Extremity numbness - nonspecific symptoms. However, given that they involve his left leg as well as his left arm we will get a stat CT of his head and then pursue MRI to rule out stroke. If his tests come back positive in anyway would consider consulting neurology at that point.   AKI (acute kidney injury) ([place]) - mild elevation in his serum creatinine, gentle fluids tonight and monitor in the morning. Avoid nephrotoxins.   HTN (hypertension) - continue home meds, currently at goal.   Type 2 diabetes mellitus ([place]) - sliding scale insulin with corresponding glucose checks   HLD (hyperlipidemia) - home dose statin, and check lipid profile in the morning.   BPH (benign prostatic hyperplasia) - continue home meds  All the records are reviewed and case discussed with ED provider. Management plans discussed with the patient and/or family.  DVT PROPHYLAXIS: SubQ lovenox  GI PROPHYLAXIS: None  ADMISSION STATUS: Observation  CODE STATUS: Full Code Status History    This patient does not have a recorded code status. Please follow your organizational policy for patients in this situation.      TOTAL TIME TAKING CARE OF THIS PATIENT: 40 minutes.    Arvell Pulsifer Josephine 06/27/2016, 9:15 PM  [facility]  Office  [phone]  CC: Primary care physician; Glendon Axe, MD

## 2016-06-28 ENCOUNTER — Observation Stay: Payer: Medicare Other

## 2016-06-28 ENCOUNTER — Observation Stay (HOSPITAL_BASED_OUTPATIENT_CLINIC_OR_DEPARTMENT_OTHER)
Admit: 2016-06-28 | Discharge: 2016-06-28 | Disposition: A | Discharge status: Home / Self Care | Payer: Medicare Other | Attending: Internal Medicine | Admitting: Internal Medicine

## 2016-06-28 DIAGNOSIS — Z8249 Family history of ischemic heart disease and other diseases of the circulatory system: Secondary | ICD-10-CM | POA: Diagnosis not present

## 2016-06-28 DIAGNOSIS — R778 Other specified abnormalities of plasma proteins: Secondary | ICD-10-CM | POA: Diagnosis present

## 2016-06-28 DIAGNOSIS — N400 Enlarged prostate without lower urinary tract symptoms: Secondary | ICD-10-CM | POA: Diagnosis present

## 2016-06-28 DIAGNOSIS — I639 Cerebral infarction, unspecified: Secondary | ICD-10-CM | POA: Diagnosis not present

## 2016-06-28 DIAGNOSIS — I10 Essential (primary) hypertension: Secondary | ICD-10-CM | POA: Diagnosis present

## 2016-06-28 DIAGNOSIS — G459 Transient cerebral ischemic attack, unspecified: Secondary | ICD-10-CM | POA: Diagnosis not present

## 2016-06-28 DIAGNOSIS — E785 Hyperlipidemia, unspecified: Secondary | ICD-10-CM | POA: Diagnosis present

## 2016-06-28 DIAGNOSIS — Z833 Family history of diabetes mellitus: Secondary | ICD-10-CM | POA: Diagnosis not present

## 2016-06-28 DIAGNOSIS — Z7982 Long term (current) use of aspirin: Secondary | ICD-10-CM | POA: Diagnosis not present

## 2016-06-28 DIAGNOSIS — I5189 Other ill-defined heart diseases: Secondary | ICD-10-CM

## 2016-06-28 DIAGNOSIS — R7989 Other specified abnormal findings of blood chemistry: Secondary | ICD-10-CM | POA: Diagnosis present

## 2016-06-28 DIAGNOSIS — N179 Acute kidney failure, unspecified: Secondary | ICD-10-CM | POA: Diagnosis present

## 2016-06-28 DIAGNOSIS — Z794 Long term (current) use of insulin: Secondary | ICD-10-CM | POA: Diagnosis not present

## 2016-06-28 DIAGNOSIS — I6350 Cerebral infarction due to unspecified occlusion or stenosis of unspecified cerebral artery: Secondary | ICD-10-CM

## 2016-06-28 DIAGNOSIS — E1165 Type 2 diabetes mellitus with hyperglycemia: Secondary | ICD-10-CM | POA: Diagnosis present

## 2016-06-28 HISTORY — DX: Other ill-defined heart diseases: I51.89

## 2016-06-28 HISTORY — DX: Cerebral infarction due to unspecified occlusion or stenosis of unspecified cerebral artery: I63.50

## 2016-06-28 LAB — LIPID PANEL
CHOL/HDL RATIO: 6.9 ratio
CHOLESTEROL: 200 mg/dL (ref 0–200)
HDL: 29 mg/dL — AB (ref >40)
LDL Cholesterol: UNDETERMINED mg/dL (ref 0–99)
Triglycerides: 515 mg/dL — ABNORMAL HIGH (ref <150)
VLDL: UNDETERMINED mg/dL (ref 0–40)

## 2016-06-28 LAB — GLUCOSE, CAPILLARY
GLUCOSE-CAPILLARY: 320 mg/dL — AB (ref 65–99)
GLUCOSE-CAPILLARY: 278 mg/dL — AB (ref 65–99)
GLUCOSE-CAPILLARY: 306 mg/dL — AB (ref 65–99)
GLUCOSE-CAPILLARY: 194 mg/dL — AB (ref 65–99)
GLUCOSE-CAPILLARY: 267 mg/dL — AB (ref 65–99)
Glucose-Capillary: 340 mg/dL — ABNORMAL HIGH (ref 65–99)

## 2016-06-28 LAB — ECHOCARDIOGRAM COMPLETE
AV Peak grad: 5 mmHg
AVPKVEL: 110 cm/s
Ao pk vel: 0.79 m/s
CHL CUP MV DEC (S): 218
EWDT: 218 ms
HEIGHTINCHES: 72 in
LAVOLA4C: 43.2 mL
LVOT peak vel: 87.3 cm/s
MV Peak grad: 2 mmHg
MV pk A vel: 107 m/s
MV pk E vel: 72.3 m/s
Weight: 3568 oz

## 2016-06-28 LAB — BASIC METABOLIC PANEL
Anion gap: 5 (ref 5–15)
BUN: 19 mg/dL (ref 6–20)
CALCIUM: 8.8 mg/dL — AB (ref 8.9–10.3)
CHLORIDE: 105 mmol/L (ref 101–111)
CO2: 26 mmol/L (ref 22–32)
CREATININE: 1.03 mg/dL (ref 0.61–1.24)
Glucose, Bld: 277 mg/dL — ABNORMAL HIGH (ref 65–99)
Potassium: 4.3 mmol/L (ref 3.5–5.1)
SODIUM: 136 mmol/L (ref 135–145)

## 2016-06-28 LAB — CBC
HEMATOCRIT: 38 % — AB (ref 40.0–52.0)
HEMOGLOBIN: 13.1 g/dL (ref 13.0–18.0)
MCH: 30.3 pg (ref 26.0–34.0)
MCHC: 34.5 g/dL (ref 32.0–36.0)
MCV: 87.9 fL (ref 80.0–100.0)
Platelets: 129 10*3/uL — ABNORMAL LOW (ref 150–440)
RBC: 4.33 MIL/uL — AB (ref 4.40–5.90)
RDW: 13.3 % (ref 11.5–14.5)
WBC: 6.5 10*3/uL (ref 3.8–10.6)

## 2016-06-28 LAB — HEMOGLOBIN A1C: Hgb A1c MFr Bld: 13.3 % — ABNORMAL HIGH (ref 4.0–6.0)

## 2016-06-28 LAB — TROPONIN I
TROPONIN I: 0.08 ng/mL — AB (ref <0.03)
TROPONIN I: 0.06 ng/mL — AB (ref <0.03)

## 2016-06-28 MED ORDER — HYDRALAZINE HCL 20 MG/ML IJ SOLN
10.0000 mg | INTRAMUSCULAR | Status: DC | PRN
  Administered 2016-06-28: 21:00:00 10 mg via INTRAVENOUS
  Filled 2016-06-28: qty 1

## 2016-06-28 MED ORDER — HYDRALAZINE HCL 20 MG/ML IJ SOLN: 10.0000 mg | Freq: Four times a day (QID) | INTRAMUSCULAR | Status: DC | PRN

## 2016-06-28 MED ORDER — SODIUM CHLORIDE 0.9 % IV SOLN: INTRAVENOUS | Status: DC

## 2016-06-28 MED ORDER — CLOPIDOGREL BISULFATE 75 MG PO TABS
75.0000 mg | ORAL_TABLET | Freq: Every day | ORAL | Status: DC
  Administered 2016-06-29: 75 mg via ORAL
  Filled 2016-06-28: qty 1

## 2016-06-28 MED ORDER — LORAZEPAM 1 MG PO TABS
1.0000 mg | ORAL_TABLET | Freq: Once | ORAL | Status: AC
  Administered 2016-06-28: 13:00:00 1 mg via ORAL
  Filled 2016-06-28: qty 1

## 2016-06-28 MED ORDER — INSULIN GLARGINE 100 UNIT/ML ~~LOC~~ SOLN
35.0000 [IU] | Freq: Two times a day (BID) | SUBCUTANEOUS | Status: DC
  Administered 2016-06-28 – 2016-06-29 (×3): 35 [IU] via SUBCUTANEOUS
  Filled 2016-06-28 (×4): qty 0.35

## 2016-06-28 NOTE — Progress Notes (Signed)
Notified Dr Marcille Blanco concerning troponin level. Notified [facility], Dr Marcille Blanco said to give 5 units  Novolog insulin.

## 2016-06-28 NOTE — Progress Notes (Addendum)
2047 BP 183/82.  Rechecked by this RN 171/92.  Called and spoke with Dr Jannifer Franklin and received order for Apresoline 10 mg IVP every 4 hours prn.   2122 BP 185/85.  Gave Apresoline 10 mg IVP.  10 minutes post 180/85, HR 71.   2208 BP 168/88 HR 74.  Pt with no symptoms, no head ache, no vision change, etc.   Will continue to monitor. Dorna Bloom RN

## 2016-06-28 NOTE — Progress Notes (Signed)
[facility] NAME: [name]    MR#:  [id number]  DATE OF BIRTH:  1946/04/11  SUBJECTIVE:  CHIEF COMPLAINT:   Chief Complaint  Patient presents with  . Weakness   Feels better but still wobbly. No CP/SOB No focal weakness  REVIEW OF SYSTEMS:    Review of Systems  Constitutional: Negative for fever and chills.  HENT: Negative for sore throat.   Eyes: Negative for blurred vision, double vision and pain.  Respiratory: Negative for cough, hemoptysis, shortness of breath and wheezing.   Cardiovascular: Negative for chest pain, palpitations, orthopnea and leg swelling.  Gastrointestinal: Negative for heartburn, nausea, vomiting, abdominal pain, diarrhea and constipation.  Genitourinary: Negative for dysuria and hematuria.  Musculoskeletal: Negative for back pain and joint pain.  Skin: Negative for rash.  Neurological: Positive for dizziness. Negative for sensory change, speech change, focal weakness and headaches.  Endo/Heme/Allergies: Does not bruise/bleed easily.  Psychiatric/Behavioral: Negative for depression. The patient is not nervous/anxious.     DRUG ALLERGIES:  No Known Allergies  VITALS:  Blood pressure 168/88, pulse 74, temperature 98 F (36.7 C), temperature source Oral, resp. rate 20, height 6' (1.829 m), weight 101.152 kg (223 lb), SpO2 98 %.  PHYSICAL EXAMINATION:   Physical Exam  GENERAL:  70 y.o.-year-old patient lying in the bed with no acute distress.  EYES: Pupils equal, round, reactive to light and accommodation. No scleral icterus. Extraocular muscles intact.  HEENT: Head atraumatic, normocephalic. Oropharynx and nasopharynx clear.  NECK:  Supple, no jugular venous distention. No thyroid enlargement, no tenderness.  LUNGS: Normal breath sounds bilaterally, no wheezing, rales, rhonchi. No use of accessory muscles of respiration.  CARDIOVASCULAR: S1, S2 normal. No murmurs, rubs, or  gallops.  ABDOMEN: Soft, nontender, nondistended. Bowel sounds present. No organomegaly or mass.  EXTREMITIES: No cyanosis, clubbing or edema b/l.    NEUROLOGIC: Cranial nerves II through XII are intact. No focal Motor or sensory deficits b/l.   PSYCHIATRIC: The patient is alert and oriented x 3.  SKIN: No obvious rash, lesion, or ulcer.   LABORATORY PANEL:   CBC  Recent Labs Lab 06/28/16 0434  WBC 6.5  HGB 13.1  HCT 38.0*  PLT 129*   ------------------------------------------------------------------------------------------------------------------ Chemistries   Recent Labs Lab 06/28/16 0434  NA 136  K 4.3  CL 105  CO2 26  GLUCOSE 277*  BUN 19  CREATININE 1.03  CALCIUM 8.8*   ------------------------------------------------------------------------------------------------------------------  Cardiac Enzymes  Recent Labs Lab 06/28/16 1024  TROPONINI 0.06*   ------------------------------------------------------------------------------------------------------------------  RADIOLOGY:  Ct Head Wo Contrast  06/27/2016  CLINICAL DATA:  Diabetes and high blood pressure with tingling in the left upper and lower extremities. EXAM: CT HEAD WITHOUT CONTRAST TECHNIQUE: Contiguous axial images were obtained from the base of the skull through the vertex without intravenous contrast. COMPARISON:  11/13/2015 FINDINGS: There is no evidence for acute hemorrhage, hydrocephalus, mass lesion, or abnormal extra-axial fluid collection. No definite CT evidence for acute infarction. Patchy low attenuation in the deep hemispheric and periventricular white matter is nonspecific, but likely reflects chronic microvascular ischemic demyelination. Small hypodensity in the left corona radiata is unchanged in the interval and compatible with old ischemic change. Chronic left cerebellar infarcts also stable. The visualized paranasal sinuses and mastoid air cells are clear. IMPRESSION: 1. Stable exam.  No  acute intracranial findings. 2. 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 Electronically Signed   By: Misty Stanley M.D.   On:  06/27/2016 22:28   Mr Brain Wo Contrast  06/28/2016  CLINICAL DATA:  71 year old diabetic hypertensive male with hyperlipidemia presenting with weakness and tingling. Un steady. Subsequent encounter. EXAM: MRI HEAD WITHOUT CONTRAST TECHNIQUE: Multiplanar, multiecho pulse sequences of the brain and surrounding structures were obtained without intravenous contrast. COMPARISON:  06/27/2016 CT.  No comparison MR. FINDINGS: Small right paracentral pontine nonhemorrhagic acute infarct. Remote infarct mid left corona radiata. Remote moderate-size left cerebellar infarct with encephalomalacia. Major intracranial vascular structures appear patent. Limited for evaluating for small or medium size vessel narrowing/occlusion. MR angiogram/CT angiogram can be obtained for further delineation if clinically desired. Mild global atrophy without hydrocephalus. No intracranial mass lesion noted on this unenhanced exam. No acute orbital abnormality. Cervical medullary junction unremarkable. IMPRESSION: Small right paracentral pontine nonhemorrhagic acute infarct. Remote infarct mid left corona radiata. Remote moderate-size left cerebellar infarct with encephalomalacia. Mild global atrophy. Electronically Signed   By: Genia Del M.D.   On: 06/28/2016 13:52     ASSESSMENT AND PLAN:   * Small right paracentral pontine nonhemorrhagic acute infarct. ASA, Statin Neuro checks PT consult Permissive HTN Hydralazine for BP > 220/120 Tele Carotids, echo  * DM SSI Home meds  * HTN See above  * DVT prophylaxis  All the records are reviewed and case discussed with Care Management/Social Workerr. Management plans discussed with the patient, family and they are in agreement.  CODE STATUS: FULL CODE  DVT Prophylaxis: SCDs  TOTAL TIME TAKING CARE OF THIS PATIENT: 35 minutes.    POSSIBLE D/C IN 1-2 DAYS, DEPENDING ON CLINICAL CONDITION.  Hillary Bow R M.D on 06/28/2016 at 11:02 PM  Between 7am to 6pm - Pager - [phone]  After 6pm go to [email or web address] - password [email or web address] [facility]  Office  [phone]  CC: Primary care physician; Glendon Axe, MD  Note: This dictation was prepared with Dragon dictation along with smaller phrase technology. Any transcriptional errors that result from this process are unintentional.

## 2016-06-28 NOTE — Progress Notes (Signed)
*  PRELIMINARY RESULTS* Echocardiogram 2D Echocardiogram has been performed.  [name] 06/28/2016, 11:17 AM

## 2016-06-29 ENCOUNTER — Inpatient Hospital Stay: Payer: Medicare Other

## 2016-06-29 DIAGNOSIS — I639 Cerebral infarction, unspecified: Secondary | ICD-10-CM | POA: Diagnosis present

## 2016-06-29 DIAGNOSIS — I779 Disorder of arteries and arterioles, unspecified: Secondary | ICD-10-CM

## 2016-06-29 HISTORY — DX: Disorder of arteries and arterioles, unspecified: I77.9

## 2016-06-29 LAB — GLUCOSE, CAPILLARY
GLUCOSE-CAPILLARY: 137 mg/dL — AB (ref 65–99)
GLUCOSE-CAPILLARY: 206 mg/dL — AB (ref 65–99)
Glucose-Capillary: 128 mg/dL — ABNORMAL HIGH (ref 65–99)

## 2016-06-29 MED ORDER — INSULIN ASPART 100 UNIT/ML ~~LOC~~ SOLN
0.0000 [IU] | Freq: Three times a day (TID) | SUBCUTANEOUS | Status: DC
  Administered 2016-06-29: 13:00:00 3 [IU] via SUBCUTANEOUS
  Administered 2016-06-29: 1 [IU] via SUBCUTANEOUS
  Filled 2016-06-29: qty 1
  Filled 2016-06-29: qty 3

## 2016-06-29 MED ORDER — CLOPIDOGREL BISULFATE 75 MG PO TABS: 75.0000 mg | ORAL_TABLET | Freq: Every day | ORAL | Status: DC

## 2016-06-29 NOTE — Progress Notes (Signed)
Pt alert and oriented x4. VSS. Dc instructions given. Went over all medications and follow up appointments. Verbalizes clear understanding.  Waiting on pt ride

## 2016-06-29 NOTE — Care Management Important Message (Signed)
Important Message  Patient Details  Name: JOFIEL DENTINO MRN: [id number] Date of Birth: 1946/06/08   Medicare Important Message Given:  Yes    Morgane Joerger A, RN 06/29/2016, 7:12 AM

## 2016-06-29 NOTE — Progress Notes (Signed)
Physical Therapy Evaluation Patient Details Name: [name] MRN: [id number] DOB: May 18, 1946 Today's Date: 06/29/2016   History of Present Illness  [name] is a 70 y.o. male who presents with Weakness and feeling "off" today. He states that when he woke up this morning he felt strange and he had some tingling sensation in his left arm and some in his left leg. He states that as the day went on and he stood up and walk around he began to feel "wobbly." He decided to come to the ED for evaluation this afternoon and here on initial workup was found to have a mildly elevated troponin at 0.05.  MRI + for Small right paracentral pontine nonhemorrhagic acute infarct.  Clinical Impression  Pt presents to PT with decreased coordination of L LE during gait and would benefit from acute PT services to address objective findings.  Pt able to get out of bed with Mod I, taking his time between transfers to acclimate to position changes.  Pt steadies self against bed before ambulating with cane in hallway.  Pt's L LE with obvious muscle coordination dysfunction without balance disturbance.  Pt overall with good 5/5 strength and awareness of L LE changes.  Pt describing L LE as feeling "heavy".  Ambulation improved with RW v. Cane.  Pt would benefit from HHPT to address goals after discharge.    Follow Up Recommendations Home health PT    Equipment Recommendations  Rolling walker with 5" wheels    Recommendations for Other Services       Precautions / Restrictions Precautions Precautions: Fall Restrictions Weight Bearing Restrictions: No      Mobility  Bed Mobility Overal bed mobility: Modified Independent             General bed mobility comments: bed rails to elevate trunk to midline  Transfers Overall transfer level: Modified independent Equipment used: Straight cane             General transfer comment: Slow/cautious to rise, self-aware of limitations; bracing  against bed upon standing to steady self  Ambulation/Gait Ambulation/Gait assistance: Supervision Ambulation Distance (Feet): 80 Feet Assistive device: Rolling walker (2 wheeled);Straight cane Gait Pattern/deviations: Step-through pattern;Decreased step length - left;Steppage;Narrow base of support     General Gait Details: Decreased motor control of L LE without loss of balance; amb about 30' with cane and wall rail then 50' with RW.  Pt with impoved gait quality when using RW, able to maintain straight course.  Stairs            Wheelchair Mobility    Modified Rankin (Stroke Patients Only)       Balance Overall balance assessment: Modified Independent                                           Pertinent Vitals/Pain Pain Assessment: No/denies pain    Home Living Family/patient expects to be discharged to:: Private residence Living Arrangements: Spouse/significant other Available Help at Discharge: Family;Available PRN/intermittently Type of Home: House Home Access: Stairs to enter (1)   Entrance Stairs-Number of Steps: 1 Home Layout: Two level;Able to live on main level with bedroom/bathroom (sleeps on couch) Home Equipment: Walker - 2 wheels;Walker - 4 wheels;Cane - single point      Prior Function Level of Independence: Independent with assistive device(s)         Comments: Uses  cane; takes care of wife (transportation to MD appts, medication, etc..)     Hand Dominance        Extremity/Trunk Assessment   Upper Extremity Assessment: Overall WFL for tasks assessed           Lower Extremity Assessment: Overall WFL for tasks assessed         Communication   Communication: No difficulties  Cognition Arousal/Alertness: Awake/alert Behavior During Therapy: WFL for tasks assessed/performed;Anxious (worried about wife's doctor appointment today) Overall Cognitive Status: Within Functional Limits for tasks assessed                       General Comments      Exercises        Assessment/Plan    PT Assessment Patient needs continued PT services  PT Diagnosis Abnormality of gait   PT Problem List Decreased mobility;Decreased coordination;Decreased knowledge of use of DME  PT Treatment Interventions DME instruction;Gait training;Functional mobility training;Therapeutic activities;Therapeutic exercise;Balance training;Neuromuscular re-education;Patient/family education;Stair training   PT Goals (Current goals can be found in the Care Plan section) Acute Rehab PT Goals Patient Stated Goal: To go home. PT Goal Formulation: With patient Time For Goal Achievement: 07/13/16 Potential to Achieve Goals: Good    Frequency 7X/week   Barriers to discharge Decreased caregiver support takes care of wife    Co-evaluation               End of Session Equipment Utilized During Treatment: Gait belt Activity Tolerance: Patient tolerated treatment well Patient left: in chair;with call bell/phone within reach;with chair alarm set Nurse Communication: Mobility status         Time: 1040-1105 PT Time Calculation (min) (ACUTE ONLY): 25 min   Charges:   PT Evaluation $PT Eval Moderate Complexity: 1 Procedure PT Treatments $Gait Training: 8-22 mins   PT G Codes:        [name] A Illona Bulman, PT 06/29/2016, 11:22 AM

## 2016-06-29 NOTE — Consult Note (Signed)
Referring Physician: Sudini    Chief Complaint: Weakness  HPI: [name] is an 70 y.o. male who presents with weakness and feeling "off" sine awakening on the day of admission. He states that when he woke up, he felt strange and he had some tingling sensation in his left arm and some in his left leg. He states that as the day went on and he stood up and walk around he began to feel "wobbly." He decided to come to the ED for evaluation and here on initial workup was found to have a mildly elevated troponin at 0.05. Initial NIHSS of 0.    Date last known well: 06/26/2016 Time last known well: Time: 21:00 tPA Given: No: Outside time window  Past Medical History  Diagnosis Date  . Diabetes mellitus without complication ([place])   . Hypertension   . HLD (hyperlipidemia)   . Depression   . BPH (benign prostatic hyperplasia)     Past Surgical History  Procedure Laterality Date  . Shoulder arthroscopy      Family History  Problem Relation Age of Onset  . Hypertension    . Diabetes Mellitus II     Social History:  reports that he has never smoked. He does not have any smokeless tobacco history on file. He reports that he does not drink alcohol or use illicit drugs.  Allergies: No Known Allergies  Medications:  I have reviewed the patient's current medications. Prior to Admission:  Prescriptions prior to admission  Medication Sig Dispense Refill Last Dose  . amitriptyline (ELAVIL) 25 MG tablet Take 25 mg by mouth at bedtime.   06/26/2016 at Unknown time  . aspirin 81 MG tablet Take 81 mg by mouth daily.   06/27/2016 at Unknown time  . cholecalciferol (VITAMIN D) 1000 UNITS tablet Take 1,000 Units by mouth daily.   06/27/2016 at Unknown time  . finasteride (PROSCAR) 5 MG tablet Take 5 mg by mouth daily.   06/27/2016 at Unknown time  . insulin aspart (NOVOLOG) 100 UNIT/ML injection Inject 15 Units into the skin every morning.    06/27/2016 at Unknown time  . insulin glargine (LANTUS)  100 UNIT/ML injection Inject 40 Units into the skin 2 (two) times daily.    06/27/2016 at Unknown time  . lisinopril (PRINIVIL,ZESTRIL) 10 MG tablet Take 10 mg by mouth every morning.   06/27/2016 at Unknown time  . metFORMIN (GLUCOPHAGE) 850 MG tablet Take 850 mg by mouth 3 (three) times daily.   06/27/2016 at Unknown time  . PARoxetine (PAXIL) 40 MG tablet Take 40 mg by mouth every morning.   06/27/2016 at Unknown time  . prazosin (MINIPRESS) 5 MG capsule Take 5 mg by mouth 2 (two) times daily.    06/27/2016 at Unknown time  . rOPINIRole (REQUIP) 0.5 MG tablet Take 0.5 mg by mouth at bedtime.   06/26/2016 at Unknown time  . simvastatin (ZOCOR) 40 MG tablet Take 40 mg by mouth at bedtime.   06/26/2016 at Unknown time  . tamsulosin (FLOMAX) 0.4 MG CAPS capsule Take 0.4 mg by mouth daily.   06/27/2016 at Unknown time  . tiZANidine (ZANAFLEX) 2 MG tablet Take 2 mg by mouth at bedtime.   06/26/2016 at Unknown time  . vitamin B-12 (CYANOCOBALAMIN) 1000 MCG tablet Take 1,000 mcg by mouth at bedtime.   06/26/2016 at Unknown time  . butalbital-acetaminophen-caffeine (FIORICET) 50-325-40 MG tablet Take 1-2 tablets by mouth every 6 (six) hours as needed for headache. 20 tablet 0   .  butalbital-acetaminophen-caffeine (FIORICET) 50-325-40 MG tablet Take 1-2 tablets by mouth every 6 (six) hours as needed for headache. 20 tablet 0   . ranitidine (ZANTAC) 150 MG tablet Take 1 tablet (150 mg total) by mouth 2 (two) times daily. 60 tablet 1   . sucralfate (CARAFATE) 1 g tablet Take 1 tablet (1 g total) by mouth 4 (four) times daily. 60 tablet 0    Scheduled: . amitriptyline  25 mg Oral QHS  . aspirin EC  81 mg Oral Daily  . clopidogrel  75 mg Oral Daily  . enoxaparin (LOVENOX) injection  40 mg Subcutaneous Q24H  . finasteride  5 mg Oral Daily  . insulin aspart  0-9 Units Subcutaneous TID WC & HS  . insulin glargine  35 Units Subcutaneous BID  . lisinopril  10 mg Oral [id number]  . rOPINIRole  0.5 mg Oral QHS  . simvastatin  40  mg Oral QHS  . sodium chloride flush  3 mL Intravenous Q12H  . tamsulosin  0.4 mg Oral Daily    ROS: History obtained from the patient  General ROS: negative for - chills, fatigue, fever, night sweats, weight gain or weight loss Psychological ROS: negative for - behavioral disorder, hallucinations, memory difficulties, mood swings or suicidal ideation Ophthalmic ROS: negative for - blurry vision, double vision, eye pain or loss of vision ENT ROS: negative for - epistaxis, nasal discharge, oral lesions, sore throat, tinnitus or vertigo Allergy and Immunology ROS: negative for - hives or itchy/watery eyes Hematological and Lymphatic ROS: negative for - bleeding problems, bruising or swollen lymph nodes Endocrine ROS: negative for - galactorrhea, hair pattern changes, polydipsia/polyuria or temperature intolerance Respiratory ROS: negative for - cough, hemoptysis, shortness of breath or wheezing Cardiovascular ROS: negative for - chest pain, dyspnea on exertion, edema or irregular heartbeat Gastrointestinal ROS: negative for - abdominal pain, diarrhea, hematemesis, nausea/vomiting or stool incontinence Genito-Urinary ROS: negative for - dysuria, hematuria, incontinence or urinary frequency/urgency Musculoskeletal ROS: negative for - joint swelling or muscular weakness Neurological ROS: as noted in HPI Dermatological ROS: negative for rash and skin lesion changes  Physical Examination: Blood pressure 146/72, pulse 95, temperature 98.1 F (36.7 C), temperature source Oral, resp. rate 20, height 6' (1.829 m), weight 101.152 kg (223 lb), SpO2 96 %.  HEENT-  Normocephalic, no lesions, without obvious abnormality.  Normal external eye and conjunctiva.  Normal TM's bilaterally.  Normal auditory canals and external ears. Normal external nose, mucus membranes and septum.  Normal pharynx. Cardiovascular- S1, S2 normal, pulses palpable throughout   Lungs- chest clear, no wheezing, rales, normal  symmetric air entry Abdomen- soft, non-tender; bowel sounds normal; no masses,  no organomegaly Extremities- no edema Lymph-no adenopathy palpable Musculoskeletal-no joint tenderness, deformity or swelling Skin-warm and dry, no hyperpigmentation, vitiligo, or suspicious lesions  Neurological Examination Mental Status: Alert, oriented, thought content appropriate.  Speech fluent without evidence of aphasia.  Able to follow 3 step commands without difficulty. Cranial Nerves: II: Discs flat bilaterally; Visual fields grossly normal, pupils equal, round, reactive to light and accommodation III,IV, VI: ptosis not present, extra-ocular motions intact bilaterally V,VII: smile symmetric, facial light touch sensation normal bilaterally VIII: hearing normal bilaterally IX,X: gag reflex present XI: bilateral shoulder shrug XII: midline tongue extension Motor: Right : Upper extremity   5/5    Left:     Upper extremity   5/5  Lower extremity   5/5     Lower extremity   5/5 Tone and bulk:normal tone throughout; no atrophy  noted Sensory: Pinprick and light touch decreased in the left hand and foot Deep Tendon Reflexes: 2+ and symmetric with absent AJ's bilaterally Plantars: Right: downgoing   Left: downgoing Cerebellar: Normal finger-to-nose testing bilaterally.  Dysmetria with heel-to-shin testing on the right Gait: not tested due to safety concerns   Laboratory Studies:  Basic Metabolic Panel:  Recent Labs Lab 06/27/16 1853 06/28/16 0434  NA 133* 136  K 4.4 4.3  CL 101 105  CO2 19* 26  GLUCOSE 410* 277*  BUN 23* 19  CREATININE 1.29* 1.03  CALCIUM 8.6* 8.8*    Liver Function Tests: No results for input(s): AST, ALT, ALKPHOS, BILITOT, PROT, ALBUMIN in the last 168 hours. No results for input(s): LIPASE, AMYLASE in the last 168 hours. No results for input(s): AMMONIA in the last 168 hours.  CBC:  Recent Labs Lab 06/27/16 1853 06/28/16 0434  WBC 9.1 6.5  HGB 13.3 13.1  HCT  39.1* 38.0*  MCV 88.4 87.9  PLT 137* 129*    Cardiac Enzymes:  Recent Labs Lab 06/27/16 1853 06/27/16 2249 06/28/16 0434 06/28/16 1024  TROPONINI 0.05* 0.04* 0.08* 0.06*    BNP: Invalid input(s): POCBNP  CBG:  Recent Labs Lab 06/28/16 1637 06/28/16 2105 06/29/16 0412 06/29/16 0746 06/29/16 1127  GLUCAP 194* 267* 137* 128* 206*    Microbiology: No results found for this or any previous visit.  Coagulation Studies: No results for input(s): LABPROT, INR in the last 72 hours.  Urinalysis:  Recent Labs Lab 06/27/16 1916  COLORURINE YELLOW*  LABSPEC 1.022  PHURINE 5.0  GLUCOSEU >500*  HGBUR NEGATIVE  BILIRUBINUR NEGATIVE  KETONESUR TRACE*  PROTEINUR NEGATIVE  NITRITE NEGATIVE  LEUKOCYTESUR NEGATIVE    Lipid Panel:    Component Value Date/Time   CHOL 200 06/28/2016 0434   TRIG 515* 06/28/2016 0434   HDL 29* 06/28/2016 0434   CHOLHDL 6.9 06/28/2016 0434   VLDL UNABLE TO CALCULATE IF TRIGLYCERIDE OVER 400 mg/dL 06/28/2016 0434   LDLCALC UNABLE TO CALCULATE IF TRIGLYCERIDE OVER 400 mg/dL 06/28/2016 0434    HgbA1C:  Lab Results  Component Value Date   HGBA1C 13.3* 06/27/2016    Urine Drug Screen:  No results found for: LABOPIA, COCAINSCRNUR, LABBENZ, AMPHETMU, THCU, LABBARB  Alcohol Level: No results for input(s): ETH in the last 168 hours.  Other results: EKG: sinus rhythm at 90 bpm.  Imaging: Ct Head Wo Contrast  06/27/2016  CLINICAL DATA:  Diabetes and high blood pressure with tingling in the left upper and lower extremities. EXAM: CT HEAD WITHOUT CONTRAST TECHNIQUE: Contiguous axial images were obtained from the base of the skull through the vertex without intravenous contrast. COMPARISON:  11/13/2015 FINDINGS: There is no evidence for acute hemorrhage, hydrocephalus, mass lesion, or abnormal extra-axial fluid collection. No definite CT evidence for acute infarction. Patchy low attenuation in the deep hemispheric and periventricular white matter  is nonspecific, but likely reflects chronic microvascular ischemic demyelination. Small hypodensity in the left corona radiata is unchanged in the interval and compatible with old ischemic change. Chronic left cerebellar infarcts also stable. The visualized paranasal sinuses and mastoid air cells are clear. IMPRESSION: 1. Stable exam.  No acute intracranial findings. 2. 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 Electronically Signed   By: Misty Stanley M.D.   On: 06/27/2016 22:28   Mr Brain Wo Contrast  06/28/2016  CLINICAL DATA:  70 year old diabetic hypertensive male with hyperlipidemia presenting with weakness and tingling. Un steady. Subsequent encounter. EXAM: MRI HEAD WITHOUT CONTRAST TECHNIQUE:  Multiplanar, multiecho pulse sequences of the brain and surrounding structures were obtained without intravenous contrast. COMPARISON:  06/27/2016 CT.  No comparison MR. FINDINGS: Small right paracentral pontine nonhemorrhagic acute infarct. Remote infarct mid left corona radiata. Remote moderate-size left cerebellar infarct with encephalomalacia. Major intracranial vascular structures appear patent. Limited for evaluating for small or medium size vessel narrowing/occlusion. MR angiogram/CT angiogram can be obtained for further delineation if clinically desired. Mild global atrophy without hydrocephalus. No intracranial mass lesion noted on this unenhanced exam. No acute orbital abnormality. Cervical medullary junction unremarkable. IMPRESSION: Small right paracentral pontine nonhemorrhagic acute infarct. Remote infarct mid left corona radiata. Remote moderate-size left cerebellar infarct with encephalomalacia. Mild global atrophy. Electronically Signed   By: Genia Del M.D.   On: 06/28/2016 13:52   US Carotid Bilateral  06/29/2016  CLINICAL DATA:  CVA. EXAM: BILATERAL CAROTID DUPLEX ULTRASOUND TECHNIQUE: Pearline Cables scale imaging, color Doppler and duplex ultrasound were performed of bilateral carotid  and vertebral arteries in the neck. COMPARISON:  None. FINDINGS: Criteria: Quantification of carotid stenosis is based on velocity parameters that correlate the residual internal carotid diameter with NASCET-based stenosis levels, using the diameter of the distal internal carotid lumen as the denominator for stenosis measurement. The following velocity measurements were obtained: RIGHT ICA:  68/15 cm/sec CCA:  0000000 cm/sec SYSTOLIC ICA/CCA RATIO:  0.6 DIASTOLIC ICA/CCA RATIO:  1.3 ECA:  107 cm/sec LEFT ICA:  71/19 cm/sec CCA:  [id number] cm/sec SYSTOLIC ICA/CCA RATIO:  0.9 DIASTOLIC ICA/CCA RATIO:  1.7 ECA:  113 cm/sec RIGHT CAROTID ARTERY: Mild right carotid bifurcation and proximal ICA atherosclerotic vascular disease. No flow limiting stenosis. RIGHT VERTEBRAL ARTERY:  Patent with antegrade flow LEFT CAROTID ARTERY: Mild left carotid bifurcation and proximal ICA atherosclerotic vascular disease. No flow limiting stenosis. LEFT VERTEBRAL ARTERY:  Patent with antegrade flow . IMPRESSION: 1. Mild bilateral carotid bifurcation proximal ICA atherosclerotic vascular disease. No flow limiting stenosis. Degree of stenosis less than 50%. 2. Vertebral arteries are patent antegrade flow. Electronically Signed   By: Marcello Moores  Register   On: 06/29/2016 09:42    Assessment: 70 y.o. male presenting with weakness.  MRI of the brain personally reviewed and shows an acute paracentral pontine hemorrhage.  Carotid dopplers show no evidence of hemodynamically significant stenosis.  Echocardiogram shows no cardiac source of emboli with an EF of 55-60%.  A1c 13.3, LDL elevated due to a triglyceride of 515.  On ASA at home.  Infarct likely secondary to small vessel disease and poorly controlled risk factors.    Stroke Risk Factors - diabetes mellitus, hyperlipidemia and hypertension  Plan: 1. Prophylactic therapy-Antiplatelet med: Plavix - dose 75mg  daily 2. Telemetry monitoring 3. Frequent neuro checks 4. Diabetes and lipid  management with target LDL of 70.     Alexis Goodell, MD Neurology [phone] 06/29/2016, 1:25 PM

## 2016-06-29 NOTE — Progress Notes (Signed)
PT dc home via w/c by family.

## 2016-06-29 NOTE — Care Management Note (Addendum)
Case Management Note  Patient Details  Name: [name] MRN: [id number] Date of Birth: [birth year]  Subjective/Objective:      70yo Fred [name] was admitted 06/27/16 with a CVA. Resides at home with his wife. [facility]-PT is recommending home with HH-PT. Fred [name] resides at home with his wife. He receives services from the [facility] and signed a Refusal of Transfer form which is in his chart after it was faxed to Dannial Monarch at the [facility]. [name] stated that he has private insurance and does not want to be transferred to [facility]. PCP=Jasmine Singh. Pharmacy=[facility] in [place]. He has a cane, a RW, a standard walker, a transport wheelchair, and a BSC at home. He reports that he has children who live close to him and are available to provide any help that is needed such as transportation. [name] chose "[facility] for home health because I have used them in the past." This Probation officer reviewed with [name] that [facility] is now called [facility]. A referral was called to Fritzi Mandes at [facility]. Case management will follow for discharge planning.           Action/Plan:   Expected Discharge Date:                  Expected Discharge Plan:     In-House Referral:     Discharge planning Services     Post Acute Care Choice:    Choice offered to:     DME Arranged:    DME Agency:     HH Arranged:    HH Agency:     Status of Service:     If discussed at [employer] of Stay Meetings, dates discussed:    Additional Comments:  Lawrnce Reyez A, RN 06/29/2016, 11:47 AM

## 2016-06-29 NOTE — Discharge Instructions (Signed)

## 2016-07-02 NOTE — Discharge Summary (Signed)
[place] [facility] [place] NAME: [name]    MR#:  [id number]  DATE OF BIRTH:  08-Oct-1946  DATE OF ADMISSION:  06/27/2016 ADMITTING PHYSICIAN: Lance Coon, MD  DATE OF DISCHARGE: 06/29/2016  2:08 PM  PRIMARY CARE PHYSICIAN: Singh,Jasmine, MD   ADMISSION DIAGNOSIS:  Elevated troponin [R79.89] Extremity numbness [R20.0]  DISCHARGE DIAGNOSIS:  Principal Problem:   Elevated troponin Active Problems:   HTN (hypertension)   HLD (hyperlipidemia)   Type 2 diabetes mellitus ([facility])   BPH (benign prostatic hyperplasia)   Extremity numbness   AKI (acute kidney injury) ([place])   Stroke ([place])   Acute CVA (cerebrovascular accident) ([place])   SECONDARY DIAGNOSIS:   Past Medical History  Diagnosis Date  . Diabetes mellitus without complication ([place])   . Hypertension   . HLD (hyperlipidemia)   . Depression   . BPH (benign prostatic hyperplasia)      ADMITTING HISTORY  [name] is a 70 y.o. male who presents with Weakness and feeling "off" today. He states that when he woke up this morning he felt strange and he had some tingling sensation in his left arm and some in his left leg. He states that as the day went on and he stood up and walk around he began to feel "wobbly." He decided to come to the ED for evaluation this afternoon and here on initial workup was found to have a mildly elevated troponin at 0.05. He denies any chest pain or shortness of breath, though he does state that he's had somewhat of an intermittent headache throughout the day today. Hospitalists were called for further evaluation and workup as well as admission.  HOSPITAL COURSE:    * Small right paracentral pontine nonhemorrhagic acute infarct. No arrhythmias on telemetry. Plavix, statin. Home health physical therapy has been set up. Follow up with PCP and neurology as outpatient. Carotids and echocardiogram showed nothing acute.  * DM SSI Home  meds  * HTN Continue home medications  * DVT prophylaxis  Stable for discharge home with home health.  CONSULTS OBTAINED:  Treatment Team:  Yolonda Kida, MD Alexis Goodell, MD  DRUG ALLERGIES:  No Known Allergies  DISCHARGE MEDICATIONS:   Discharge Medication List as of 06/29/2016  1:19 PM    START taking these medications   Details  clopidogrel (PLAVIX) 75 MG tablet Take 1 tablet (75 mg total) by mouth daily., Starting 06/29/2016, Until Discontinued, Normal      CONTINUE these medications which have NOT CHANGED   Details  amitriptyline (ELAVIL) 25 MG tablet Take 25 mg by mouth at bedtime., Until Discontinued, Historical Med    cholecalciferol (VITAMIN D) 1000 UNITS tablet Take 1,000 Units by mouth daily., Until Discontinued, Historical Med    finasteride (PROSCAR) 5 MG tablet Take 5 mg by mouth daily., Until Discontinued, Historical Med    insulin aspart (NOVOLOG) 100 UNIT/ML injection Inject 15 Units into the skin every morning. , Until Discontinued, Historical Med    insulin glargine (LANTUS) 100 UNIT/ML injection Inject 40 Units into the skin 2 (two) times daily. , Until Discontinued, Historical Med    lisinopril (PRINIVIL,ZESTRIL) 10 MG tablet Take 10 mg by mouth every morning., Until Discontinued, Historical Med    metFORMIN (GLUCOPHAGE) 850 MG tablet Take 850 mg by mouth 3 (three) times daily., Until Discontinued, Historical Med    PARoxetine (PAXIL) 40 MG tablet Take 40 mg by mouth every morning., Until Discontinued, Historical Med  prazosin (MINIPRESS) 5 MG capsule Take 5 mg by mouth 2 (two) times daily. , Until Discontinued, Historical Med    rOPINIRole (REQUIP) 0.5 MG tablet Take 0.5 mg by mouth at bedtime., Until Discontinued, Historical Med    simvastatin (ZOCOR) 40 MG tablet Take 40 mg by mouth at bedtime., Until Discontinued, Historical Med    tamsulosin (FLOMAX) 0.4 MG CAPS capsule Take 0.4 mg by mouth daily., Until Discontinued, Historical Med     tiZANidine (ZANAFLEX) 2 MG tablet Take 2 mg by mouth at bedtime., Until Discontinued, Historical Med    vitamin B-12 (CYANOCOBALAMIN) 1000 MCG tablet Take 1,000 mcg by mouth at bedtime., Until Discontinued, Historical Med    !! butalbital-acetaminophen-caffeine (FIORICET) 50-325-40 MG tablet Take 1-2 tablets by mouth every 6 (six) hours as needed for headache., Starting 11/13/2015, Until Thu 11/12/16, Print    !! butalbital-acetaminophen-caffeine (FIORICET) 50-325-40 MG tablet Take 1-2 tablets by mouth every 6 (six) hours as needed for headache., Starting 11/16/2015, Until Sun 11/15/16, Print    ranitidine (ZANTAC) 150 MG tablet Take 1 tablet (150 mg total) by mouth 2 (two) times daily., Starting 02/05/2016, Until Thu 02/04/17, Print    sucralfate (CARAFATE) 1 g tablet Take 1 tablet (1 g total) by mouth 4 (four) times daily., Starting 02/05/2016, Until Discontinued, Print     !! - Potential duplicate medications found. Please discuss with provider.    STOP taking these medications     aspirin 81 MG tablet        Today   VITAL SIGNS:  Blood pressure 146/72, pulse 95, temperature 98.1 F (36.7 C), temperature source Oral, resp. rate 20, height 6' (1.829 m), weight 101.152 kg (223 lb), SpO2 96 %.  I/O:  No intake or output data in the 24 hours ending 07/02/16 1418  PHYSICAL EXAMINATION:  Physical Exam  GENERAL:  70 y.o.-year-old patient lying in the bed with no acute distress.  LUNGS: Normal breath sounds bilaterally, no wheezing, rales,rhonchi or crepitation. No use of accessory muscles of respiration.  CARDIOVASCULAR: S1, S2 normal. No murmurs, rubs, or gallops.  ABDOMEN: Soft, non-tender, non-distended. Bowel sounds present. No organomegaly or mass.  NEUROLOGIC: Moves all 4 extremities. PSYCHIATRIC: The patient is alert and oriented x 3.  SKIN: No obvious rash, lesion, or ulcer.   DATA REVIEW:   CBC  Recent Labs Lab 06/28/16 0434  WBC 6.5  HGB 13.1  HCT 38.0*  PLT 129*     Chemistries   Recent Labs Lab 06/28/16 0434  NA 136  K 4.3  CL 105  CO2 26  GLUCOSE 277*  BUN 19  CREATININE 1.03  CALCIUM 8.8*    Cardiac Enzymes  Recent Labs Lab 06/28/16 1024  TROPONINI 0.06*    Microbiology Results  No results found for this or any previous visit.  RADIOLOGY:  No results found.  Follow up with PCP in 1 week.  Management plans discussed with the patient, family and they are in agreement.  CODE STATUS:  Code Status History    Date Active Date Inactive Code Status Order ID Comments User Context   06/27/2016 10:25 PM 06/29/2016  5:14 PM Full Code [id number]  Lance Coon, MD ED      TOTAL TIME TAKING CARE OF THIS PATIENT ON DAY OF DISCHARGE: more than 30 minutes.   Hillary Bow R M.D on 07/02/2016 at 2:18 PM  Between 7am to 6pm - Pager - [phone]  After 6pm go to [email or web address] - password [email or web address] [facility]  Office  [phone]  CC: Primary care physician; Glendon Axe, MD  Note: This dictation was prepared with Dragon dictation along with smaller phrase technology. Any transcriptional errors that result from this process are unintentional.

## 2016-07-12 ENCOUNTER — Encounter: Payer: Self-pay | Admitting: Emergency Medicine

## 2016-07-12 ENCOUNTER — Emergency Department
Admission: EM | Admit: 2016-07-12 | Discharge: 2016-07-12 | Disposition: A | Discharge status: Home / Self Care | Payer: Medicare Other | Attending: Emergency Medicine | Admitting: Emergency Medicine

## 2016-07-12 ENCOUNTER — Emergency Department: Payer: Medicare Other

## 2016-07-12 DIAGNOSIS — Z794 Long term (current) use of insulin: Secondary | ICD-10-CM | POA: Diagnosis not present

## 2016-07-12 DIAGNOSIS — R51 Headache: Secondary | ICD-10-CM | POA: Diagnosis present

## 2016-07-12 DIAGNOSIS — E785 Hyperlipidemia, unspecified: Secondary | ICD-10-CM | POA: Diagnosis not present

## 2016-07-12 DIAGNOSIS — F329 Major depressive disorder, single episode, unspecified: Secondary | ICD-10-CM | POA: Diagnosis not present

## 2016-07-12 DIAGNOSIS — E119 Type 2 diabetes mellitus without complications: Secondary | ICD-10-CM | POA: Diagnosis not present

## 2016-07-12 DIAGNOSIS — Z7984 Long term (current) use of oral hypoglycemic drugs: Secondary | ICD-10-CM | POA: Insufficient documentation

## 2016-07-12 DIAGNOSIS — Z8673 Personal history of transient ischemic attack (TIA), and cerebral infarction without residual deficits: Secondary | ICD-10-CM | POA: Insufficient documentation

## 2016-07-12 DIAGNOSIS — I10 Essential (primary) hypertension: Secondary | ICD-10-CM | POA: Insufficient documentation

## 2016-07-12 LAB — CBC
HEMATOCRIT: 40.5 % (ref 40.0–52.0)
HEMOGLOBIN: 14.3 g/dL (ref 13.0–18.0)
MCH: 30.8 pg (ref 26.0–34.0)
MCHC: 35.3 g/dL (ref 32.0–36.0)
MCV: 87.4 fL (ref 80.0–100.0)
PLATELETS: 161 10*3/uL (ref 150–440)
RBC: 4.64 MIL/uL (ref 4.40–5.90)
RDW: 13.4 % (ref 11.5–14.5)
WBC: 8.2 10*3/uL (ref 3.8–10.6)

## 2016-07-12 LAB — BASIC METABOLIC PANEL
Anion gap: 9 (ref 5–15)
BUN: 17 mg/dL (ref 6–20)
CHLORIDE: 109 mmol/L (ref 101–111)
CO2: 23 mmol/L (ref 22–32)
CREATININE: 0.84 mg/dL (ref 0.61–1.24)
Calcium: 9.5 mg/dL (ref 8.9–10.3)
GFR calc non Af Amer: >60 mL/min (ref >60)
Glucose, Bld: 130 mg/dL — ABNORMAL HIGH (ref 65–99)
POTASSIUM: 3.6 mmol/L (ref 3.5–5.1)
Sodium: 141 mmol/L (ref 135–145)

## 2016-07-12 LAB — TROPONIN I: Troponin I: <0.03 ng/mL (ref <0.03)

## 2016-07-12 MED ORDER — METFORMIN HCL 850 MG PO TABS
850.0000 mg | ORAL_TABLET | ORAL | Status: AC
  Administered 2016-07-12: 850 mg via ORAL
  Filled 2016-07-12: qty 1

## 2016-07-12 MED ORDER — PRAZOSIN HCL 5 MG PO CAPS
5.0000 mg | ORAL_CAPSULE | ORAL | Status: AC
  Administered 2016-07-12: 5 mg via ORAL
  Filled 2016-07-12: qty 1

## 2016-07-12 NOTE — ED Notes (Signed)
Pt presents from home via EMS for elevated blood pressure of 190/100. Pt state she has SOB when ambulating. A/o x4.

## 2016-07-12 NOTE — Discharge Instructions (Signed)
Hypertension Hypertension, commonly called high blood pressure, is when the force of blood pumping through your arteries is too strong. Your arteries are the blood vessels that carry blood from your heart throughout your body. A blood pressure reading consists of a higher number over a lower number, such as 110/72. The higher number (systolic) is the pressure inside your arteries when your heart pumps. The lower number (diastolic) is the pressure inside your arteries when your heart relaxes. Ideally you want your blood pressure below 120/80. Hypertension forces your heart to work harder to pump blood. Your arteries may become narrow or stiff. Having untreated or uncontrolled hypertension can cause heart attack, stroke, kidney disease, and other problems. RISK FACTORS Some risk factors for high blood pressure are controllable. Others are not.  Risk factors you cannot control include:   Race. You may be at higher risk if you are African American.  Age. Risk increases with age.  Gender. Men are at higher risk than women before age 45 years. After age 65, women are at higher risk than men. Risk factors you can control include:  Not getting enough exercise or physical activity.  Being overweight.  Getting too much fat, sugar, calories, or salt in your diet.  Drinking too much alcohol. SIGNS AND SYMPTOMS Hypertension does not usually cause signs or symptoms. Extremely high blood pressure (hypertensive crisis) may cause headache, anxiety, shortness of breath, and nosebleed. DIAGNOSIS To check if you have hypertension, your health care provider will measure your blood pressure while you are seated, with your arm held at the level of your heart. It should be measured at least twice using the same arm. Certain conditions can cause a difference in blood pressure between your right and left arms. A blood pressure reading that is higher than normal on one occasion does not mean that you need treatment. If  it is not clear whether you have high blood pressure, you may be asked to return on a different day to have your blood pressure checked again. Or, you may be asked to monitor your blood pressure at home for 1 or more weeks. TREATMENT Treating high blood pressure includes making lifestyle changes and possibly taking medicine. Living a healthy lifestyle can help lower high blood pressure. You may need to change some of your habits. Lifestyle changes may include:  Following the DASH diet. This diet is high in fruits, vegetables, and whole grains. It is low in salt, red meat, and added sugars.  Keep your sodium intake below 2,300 mg per day.  Getting at least 30-45 minutes of aerobic exercise at least 4 times per week.  Losing weight if necessary.  Not smoking.  Limiting alcoholic beverages.  Learning ways to reduce stress. Your health care provider may prescribe medicine if lifestyle changes are not enough to get your blood pressure under control, and if one of the following is true:  You are 18-59 years of age and your systolic blood pressure is above 140.  You are 60 years of age or older, and your systolic blood pressure is above 150.  Your diastolic blood pressure is above 90.  You have diabetes, and your systolic blood pressure is over 140 or your diastolic blood pressure is over 90.  You have kidney disease and your blood pressure is above 140/90.  You have heart disease and your blood pressure is above 140/90. Your personal target blood pressure may vary depending on your medical conditions, your age, and other factors. HOME CARE INSTRUCTIONS    Have your blood pressure rechecked as directed by your health care provider.   Take medicines only as directed by your health care provider. Follow the directions carefully. Blood pressure medicines must be taken as prescribed. The medicine does not work as well when you skip doses. Skipping doses also puts you at risk for  problems.  Do not smoke.   Monitor your blood pressure at home as directed by your health care provider. SEEK MEDICAL CARE IF:   You think you are having a reaction to medicines taken.  You have recurrent headaches or feel dizzy.  You have swelling in your ankles.  You have trouble with your vision. SEEK IMMEDIATE MEDICAL CARE IF:  You develop a severe headache or confusion.  You have unusual weakness, numbness, or feel faint.  You have severe chest or abdominal pain.  You vomit repeatedly.  You have trouble breathing. MAKE SURE YOU:   Understand these instructions.  Will watch your condition.  Will get help right away if you are not doing well or get worse.   This information is not intended to replace advice given to you by your health care provider. Make sure you discuss any questions you have with your health care provider.   Document Released: 12/14/2005 Document Revised: 04/30/2015 Document Reviewed: 10/06/2013 Elsevier Interactive Patient Education 2016 [employer] Inc[employer]  

## 2016-07-12 NOTE — ED Provider Notes (Signed)
[facility] Emergency Department Provider Note  ____________________________________________  Time seen: Approximately 2:00 PM  I have reviewed the triage vital signs and the nursing notes.   HISTORY  Chief Complaint Hypertension    HPI [name] is a 70 y.o. male reports he had a recent small stroke that left him with some mild left-sided weakness but still the ability to walk and function well.  Reports she is taking aspirin daily. Today he's been feeling okay, but he reports he checked his blood pressure routinely at home and it was 190/100. States this worried him some, he called EMS and was advised to come for further evaluation.  The patient evidently mentioned to the nurse that he is having some shortness of breath with walking, he tells me that he is not having shortness of breath but rather he gets tired after walking about 30 feet on the left side where he had a stroke. This is been ongoing since that time he was discharged, he tells me that he has physical therapy coming to his home tomorrow.  He denies any new symptoms, worsening weakness numbness or tingling. He does report he had a mild headache last night but this is gone away completely. No fevers chills or chest pain. No trouble breathing. Patient reports that he is due for his prazosin dose at noon today.   Past Medical History  Diagnosis Date  . Diabetes mellitus without complication ([place])   . Hypertension   . HLD (hyperlipidemia)   . Depression   . BPH (benign prostatic hyperplasia)     Patient Active Problem List   Diagnosis Date Noted  . Acute CVA (cerebrovascular accident) ([place]) 06/29/2016  . Stroke ([place]) 06/28/2016  . Elevated troponin 06/27/2016  . HTN (hypertension) 06/27/2016  . HLD (hyperlipidemia) 06/27/2016  . Type 2 diabetes mellitus ([place]) 06/27/2016  . BPH (benign prostatic hyperplasia) 06/27/2016  . Extremity numbness 06/27/2016  . AKI (acute kidney  injury) ([place]) 06/27/2016    Past Surgical History  Procedure Laterality Date  . Shoulder arthroscopy      Current Outpatient Rx  Name  Route  Sig  Dispense  Refill  . amitriptyline (ELAVIL) 25 MG tablet   Oral   Take 25 mg by mouth at bedtime.         . butalbital-acetaminophen-caffeine (FIORICET) 50-325-40 MG tablet   Oral   Take 1-2 tablets by mouth every 6 (six) hours as needed for headache.   20 tablet   0   . butalbital-acetaminophen-caffeine (FIORICET) 50-325-40 MG tablet   Oral   Take 1-2 tablets by mouth every 6 (six) hours as needed for headache.   20 tablet   0   . cholecalciferol (VITAMIN D) 1000 UNITS tablet   Oral   Take 1,000 Units by mouth daily.         . clopidogrel (PLAVIX) 75 MG tablet   Oral   Take 1 tablet (75 mg total) by mouth daily.   30 tablet   0   . finasteride (PROSCAR) 5 MG tablet   Oral   Take 5 mg by mouth daily.         . insulin aspart (NOVOLOG) 100 UNIT/ML injection   Subcutaneous   Inject 15 Units into the skin every morning.          . insulin glargine (LANTUS) 100 UNIT/ML injection   Subcutaneous   Inject 40 Units into the skin 2 (two) times daily.          Marland Kitchen  lisinopril (PRINIVIL,ZESTRIL) 10 MG tablet   Oral   Take 10 mg by mouth every morning.         . metFORMIN (GLUCOPHAGE) 850 MG tablet   Oral   Take 850 mg by mouth 3 (three) times daily.         Marland Kitchen PARoxetine (PAXIL) 40 MG tablet   Oral   Take 40 mg by mouth every morning.         . prazosin (MINIPRESS) 5 MG capsule   Oral   Take 5 mg by mouth 2 (two) times daily.          . ranitidine (ZANTAC) 150 MG tablet   Oral   Take 1 tablet (150 mg total) by mouth 2 (two) times daily.   60 tablet   1   . rOPINIRole (REQUIP) 0.5 MG tablet   Oral   Take 0.5 mg by mouth at bedtime.         . simvastatin (ZOCOR) 40 MG tablet   Oral   Take 40 mg by mouth at bedtime.         . sucralfate (CARAFATE) 1 g tablet   Oral   Take 1 tablet (1 g  total) by mouth 4 (four) times daily.   60 tablet   0   . tamsulosin (FLOMAX) 0.4 MG CAPS capsule   Oral   Take 0.4 mg by mouth daily.         Marland Kitchen tiZANidine (ZANAFLEX) 2 MG tablet   Oral   Take 2 mg by mouth at bedtime.         . vitamin B-12 (CYANOCOBALAMIN) 1000 MCG tablet   Oral   Take 1,000 mcg by mouth at bedtime.           Allergies Review of patient's allergies indicates no known allergies.  Family History  Problem Relation Age of Onset  . Hypertension    . Diabetes Mellitus II      Social History Social History  Substance Use Topics  . Smoking status: Never Smoker   . Smokeless tobacco: None  . Alcohol Use: No    Review of Systems Constitutional: No fever/chills Eyes: No visual changes. ENT: No sore throat. Cardiovascular: Denies chest pain. Respiratory: Denies shortness of breath. Gastrointestinal: No abdominal pain.  No nausea, no vomiting.  No diarrhea.  No constipation. Genitourinary: Negative for dysuria. Musculoskeletal: Negative for back pain. Skin: Negative for rash. Neurological: Negative for focal weakness or numbness except for chronic weakness in the left arm and left leg which is unchanged.  10-point ROS otherwise negative.  ____________________________________________   PHYSICAL EXAM:  VITAL SIGNS: ED Triage Vitals  Enc Vitals Group     BP 07/12/16 1150 159/78 mmHg     Pulse Rate 07/12/16 1150 78     Resp 07/12/16 1150 20     Temp 07/12/16 1150 98.1 F (36.7 C)     Temp Source 07/12/16 1150 Oral     SpO2 07/12/16 1150 98 %     Weight 07/12/16 1150 220 lb (99.791 kg)     Height 07/12/16 1150 6' (1.829 m)     Head Cir --      Peak Flow --      Pain Score 07/12/16 1151 0     Pain Loc --      Pain Edu? --      Excl. in [place]? --    Constitutional: Alert and oriented. Well appearing and in no acute distress. Eyes:  Conjunctivae are normal. PERRL. EOMI. Head: Atraumatic. Nose: No congestion/rhinnorhea. Mouth/Throat: Mucous  membranes are moist.  Oropharynx non-erythematous. Neck: No stridor.   Cardiovascular: Normal rate, regular rhythm. Grossly normal heart sounds.  Good peripheral circulation. Respiratory: Normal respiratory effort.  No retractions. Lungs CTAB. Gastrointestinal: Soft and nontender. No distention. Musculoskeletal: No lower extremity tenderness nor edema. Neurologic:  Normal speech and language. No gross focal neurologic deficits are appreciated. No gait instability. Skin:  Skin is warm, dry and intact. No rash noted. Psychiatric: Mood and affect are normal. Speech and behavior are normal.  ____________________________________________   LABS (all labs ordered are listed, but only abnormal results are displayed)  Labs Reviewed  BASIC METABOLIC PANEL - Abnormal; Notable for the following:    Glucose, Bld 130 (*)    All other components within normal limits  CBC  TROPONIN I  URINALYSIS COMPLETEWITH MICROSCOPIC ([facility] ONLY)   ____________________________________________  EKG  ED ECG REPORT I, QUALE, MARK, the attending physician, personally viewed and interpreted this ECG.  Date: 07/12/2016 EKG Time: 1200 Rate: 80 Rhythm: normal sinus rhythm QRS Axis: normal Intervals: normal ST/T Wave abnormalities: normal Conduction Disturbances: none Narrative Interpretation: unremarkable  ____________________________________________  RADIOLOGY   CT Head Wo Contrast (Final result) Result time: 07/12/16 14:06:18   Final result by Rad Results In Interface (07/12/16 14:06:18)   Narrative:   CLINICAL DATA: Headache  EXAM: CT HEAD WITHOUT CONTRAST  TECHNIQUE: Contiguous axial images were obtained from the base of the skull through the vertex without intravenous contrast.  COMPARISON: 06/27/2016  FINDINGS: Brain: Mild prominence of the sulci and ventricles noted. No evidence of acute infarction, hemorrhage, extra-axial collection, ventriculomegaly, or mass effect.  Vascular:  No hyperdense vessel or unexpected calcification.  Skull: Negative for fracture or focal lesion.  Sinuses/Orbits: No acute findings.  Other: None.  IMPRESSION: 1. No acute intracranial abnormalities.   Electronically Signed By: Kerby Moors M.D. On: 07/12/2016 14:06    ____________________________________________   PROCEDURES  Procedure(s) performed: None  Critical Care performed: No  ____________________________________________   INITIAL IMPRESSION / ASSESSMENT AND PLAN / ED COURSE  Pertinent labs & imaging results that were available during my care of the patient were reviewed by me and considered in my medical decision making (see chart for details).  Patient presents for evaluation of high blood pressure. He did monitoring at home and notices high today, as he continue to monitor it seemed to go higher and higher. He denies any other acute new or concerning symptoms however. Reports his primary concern about his low pressure and thinks it is going up his causing anxiety. He is not having acute neurologic cardiac or pulmonary symptoms. He does have pre-existing neurologic symptoms, but these are unchanged and chronic in nature. He did have a headache last night because that reason we'll obtain a CT scan to evaluate for evidence of any intracranial hemorrhage perhaps into his old stroke though I find it unlikely.  ----------------------------------------- 4:50 PM on 07/12/2016 -----------------------------------------  Patient reports he feels well. His blood pressure well-controlled here without intervention. He has physical therapy set up for tomorrow in his home, has primary follow-up on Friday, as well as multiple specialty follow-ups in the next week. There is no evidence of acute abnormality. Vital signs are stable, he is normally oriented with no evidence of acute cardiac pulmonary neurologic deficit. Denies infectious symptoms. I feel is stable for discharge  with ongoing monitoring his blood pressure home. This is reasonable, reassured patient discussed treatment of blood pressure.  Patient very agreeable.  Return precautions and treatment recommendations and follow-up discussed with the patient who is agreeable with the plan.  ____________________________________________   FINAL CLINICAL IMPRESSION(S) / ED DIAGNOSES  Final diagnoses:  Essential hypertension      Delman Kitten, MD 07/12/16 1651

## 2016-07-14 DIAGNOSIS — I779 Disorder of arteries and arterioles, unspecified: Secondary | ICD-10-CM | POA: Insufficient documentation

## 2016-09-08 ENCOUNTER — Encounter: Payer: Self-pay | Admitting: Emergency Medicine

## 2016-09-08 ENCOUNTER — Emergency Department
Admission: EM | Admit: 2016-09-08 | Discharge: 2016-09-09 | Disposition: A | Discharge status: Home / Self Care | Payer: Medicare Other | Attending: Emergency Medicine | Admitting: Emergency Medicine

## 2016-09-08 DIAGNOSIS — I10 Essential (primary) hypertension: Secondary | ICD-10-CM | POA: Diagnosis not present

## 2016-09-08 DIAGNOSIS — E119 Type 2 diabetes mellitus without complications: Secondary | ICD-10-CM | POA: Insufficient documentation

## 2016-09-08 DIAGNOSIS — Z794 Long term (current) use of insulin: Secondary | ICD-10-CM | POA: Diagnosis not present

## 2016-09-08 DIAGNOSIS — T383X1A Poisoning by insulin and oral hypoglycemic [antidiabetic] drugs, accidental (unintentional), initial encounter: Secondary | ICD-10-CM | POA: Diagnosis present

## 2016-09-08 DIAGNOSIS — Z7984 Long term (current) use of oral hypoglycemic drugs: Secondary | ICD-10-CM | POA: Diagnosis not present

## 2016-09-08 DIAGNOSIS — T50901A Poisoning by unspecified drugs, medicaments and biological substances, accidental (unintentional), initial encounter: Secondary | ICD-10-CM

## 2016-09-08 DIAGNOSIS — Z79899 Other long term (current) drug therapy: Secondary | ICD-10-CM | POA: Diagnosis not present

## 2016-09-08 LAB — GLUCOSE, CAPILLARY: Glucose-Capillary: 153 mg/dL — ABNORMAL HIGH (ref 65–99)

## 2016-09-08 NOTE — ED Triage Notes (Signed)
Pt took 75 Units of novolog versus his lantus on accident less then 30 min pta

## 2016-09-08 NOTE — ED Provider Notes (Signed)
[facility] Emergency Department Provider Note    First MD Initiated Contact with Patient 09/08/16 2351     (approximate)  I have reviewed the triage vital signs and the nursing notes.   HISTORY  Chief Complaint Drug Overdose   HPI [name] is a 70 y.o. male history diabetes presents to the emergency Department after accidental taken 75 units of NovoLog at approximately 11:15 PM tonight. Patient states that he is supposed to take 75 units of Lantus however mistakenly took his NovoLog. Patient denies any suicidal ideation. Patient states his glucose before insulin administration was 285.   Past Medical History:  Diagnosis Date  . BPH (benign prostatic hyperplasia)   . Depression   . Diabetes mellitus without complication ([place])   . HLD (hyperlipidemia)   . Hypertension     Patient Active Problem List   Diagnosis Date Noted  . Acute CVA (cerebrovascular accident) ([place]) 06/29/2016  . Stroke ([place]) 06/28/2016  . Elevated troponin 06/27/2016  . HTN (hypertension) 06/27/2016  . HLD (hyperlipidemia) 06/27/2016  . Type 2 diabetes mellitus ([place]) 06/27/2016  . BPH (benign prostatic hyperplasia) 06/27/2016  . Extremity numbness 06/27/2016  . AKI (acute kidney injury) ([place]) 06/27/2016    Past Surgical History:  Procedure Laterality Date  . SHOULDER ARTHROSCOPY      Prior to Admission medications   Medication Sig Start Date End Date Taking? Authorizing Provider  amitriptyline (ELAVIL) 25 MG tablet Take 25 mg by mouth at bedtime.    Historical Provider, MD  butalbital-acetaminophen-caffeine (FIORICET) 50-325-40 MG tablet Take 1-2 tablets by mouth every 6 (six) hours as needed for headache. 11/13/15 11/12/16  Orbie Pyo, MD  butalbital-acetaminophen-caffeine (FIORICET) [phone] MG tablet Take 1-2 tablets by mouth every 6 (six) hours as needed for headache. 11/16/15 11/15/16  Orbie Pyo, MD  cholecalciferol (VITAMIN D)  1000 UNITS tablet Take 1,000 Units by mouth daily.    Historical Provider, MD  clopidogrel (PLAVIX) 75 MG tablet Take 1 tablet (75 mg total) by mouth daily. 06/29/16   Srikar Sudini, MD  finasteride (PROSCAR) 5 MG tablet Take 5 mg by mouth daily.    Historical Provider, MD  insulin aspart (NOVOLOG) 100 UNIT/ML injection Inject 15 Units into the skin every morning.     Historical Provider, MD  insulin glargine (LANTUS) 100 UNIT/ML injection Inject 40 Units into the skin 2 (two) times daily.     Historical Provider, MD  lisinopril (PRINIVIL,ZESTRIL) 10 MG tablet Take 10 mg by mouth every morning.    Historical Provider, MD  metFORMIN (GLUCOPHAGE) 850 MG tablet Take 850 mg by mouth 3 (three) times daily.    Historical Provider, MD  PARoxetine (PAXIL) 40 MG tablet Take 40 mg by mouth every morning.    Historical Provider, MD  prazosin (MINIPRESS) 5 MG capsule Take 5 mg by mouth 2 (two) times daily.     Historical Provider, MD  ranitidine (ZANTAC) 150 MG tablet Take 1 tablet (150 mg total) by mouth 2 (two) times daily. 02/05/16 02/04/17  Nance Pear, MD  rOPINIRole (REQUIP) 0.5 MG tablet Take 0.5 mg by mouth at bedtime.    Historical Provider, MD  simvastatin (ZOCOR) 40 MG tablet Take 40 mg by mouth at bedtime.    Historical Provider, MD  sucralfate (CARAFATE) 1 g tablet Take 1 tablet (1 g total) by mouth 4 (four) times daily. 02/05/16   Nance Pear, MD  tamsulosin (FLOMAX) 0.4 MG CAPS capsule Take 0.4 mg by mouth daily.  Historical Provider, MD  tiZANidine (ZANAFLEX) 2 MG tablet Take 2 mg by mouth at bedtime.    Historical Provider, MD  vitamin B-12 (CYANOCOBALAMIN) 1000 MCG tablet Take 1,000 mcg by mouth at bedtime.    Historical Provider, MD    Allergies No known drug allergies  Family History  Problem Relation Age of Onset  . Hypertension    . Diabetes Mellitus II      Social History Social History  Substance Use Topics  . Smoking status: Never Smoker  . Smokeless tobacco: Not on file   . Alcohol use No    Review of Systems Constitutional: No fever/chills Eyes: No visual changes. ENT: No sore throat. Cardiovascular: Denies chest pain. Respiratory: Denies shortness of breath. Gastrointestinal: No abdominal pain.  No nausea, no vomiting.  No diarrhea.  No constipation. Genitourinary: Negative for dysuria. Musculoskeletal: Negative for back pain. Skin: Negative for rash. Neurological: Negative for headaches, focal weakness or numbness.  10-point ROS otherwise negative.  ____________________________________________   PHYSICAL EXAM:  VITAL SIGNS: ED Triage Vitals  Enc Vitals Group     BP 09/08/16 2342 (!) 163/75     Pulse Rate 09/08/16 2341 (!) 103     Resp 09/08/16 2341 20     Temp 09/08/16 2341 97.6 F (36.4 C)     Temp Source 09/08/16 2341 Oral     SpO2 09/08/16 2341 96 %     Weight 09/08/16 2342 220 lb (99.8 kg)     Height 09/08/16 2342 6' (1.829 m)     Head Circumference --      Peak Flow --      Pain Score --      Pain Loc --      Pain Edu? --      Excl. in [place]? --     Constitutional: Alert and oriented. Well appearing and in no acute distress. Eyes: Conjunctivae are normal. PERRL. EOMI. Head: Atraumatic. Ears:  Healthy appearing ear canals and TMs bilaterally Nose: No congestion/rhinnorhea. Mouth/Throat: Mucous membranes are moist.  Oropharynx non-erythematous. Neck: No stridor.  No meningeal signs.   Cardiovascular: Normal rate, regular rhythm. Good peripheral circulation. Grossly normal heart sounds. Respiratory: Normal respiratory effort.  No retractions. Lungs CTAB. Gastrointestinal: Soft and nontender. No distention.  Musculoskeletal: No lower extremity tenderness nor edema. No gross deformities of extremities. Neurologic:  Normal speech and language. No gross focal neurologic deficits are appreciated.  Skin:  Skin is warm, dry and intact. No rash noted. Psychiatric: Anxious affect.Marland Kitchen Speech and behavior are  normal.  ____________________________________________   LABS (all labs ordered are listed, but only abnormal results are displayed)  Labs Reviewed  GLUCOSE, CAPILLARY - Abnormal; Notable for the following:       Result Value   Glucose-Capillary 153 (*)    All other components within normal limits       Procedures    INITIAL IMPRESSION / [place] / ED COURSE  Pertinent labs & imaging results that were available during my care of the patient were reviewed by me and considered in my medical decision making (see chart for details).  Patient observed in the emergency department for approximately 5 hours. Patient was supported with D5 normal saline at 125 ML's an hour for 3 hours which was subsequent. Patient's glucose was checked every hour and remained stable throughout. Patient's current glucose is 250 following eating.   Clinical Course    ____________________________________________  FINAL CLINICAL IMPRESSION(S) / ED DIAGNOSES  Final diagnoses:  Medication  overdose, accidental or unintentional, initial encounter     MEDICATIONS GIVEN DURING THIS VISIT:  Medications - No data to display   NEW OUTPATIENT MEDICATIONS STARTED DURING THIS VISIT:  New Prescriptions   No medications on file    Modified Medications   No medications on file    Discontinued Medications   No medications on file     Note:  This document was prepared using Dragon voice recognition software and may include unintentional dictation errors.    Gregor Hams, MD 09/09/16 [id number]

## 2016-09-09 LAB — COMPREHENSIVE METABOLIC PANEL
ALBUMIN: 4.1 g/dL (ref 3.5–5.0)
ALK PHOS: 52 U/L (ref 38–126)
ALT: 20 U/L (ref 17–63)
ANION GAP: 12 (ref 5–15)
AST: 18 U/L (ref 15–41)
BUN: 32 mg/dL — ABNORMAL HIGH (ref 6–20)
CALCIUM: 9.6 mg/dL (ref 8.9–10.3)
CHLORIDE: 107 mmol/L (ref 101–111)
CO2: 22 mmol/L (ref 22–32)
Creatinine, Ser: 0.95 mg/dL (ref 0.61–1.24)
GFR calc Af Amer: >60 mL/min (ref >60)
GFR calc non Af Amer: >60 mL/min (ref >60)
GLUCOSE: 128 mg/dL — AB (ref 65–99)
Potassium: 3.6 mmol/L (ref 3.5–5.1)
SODIUM: 141 mmol/L (ref 135–145)
Total Bilirubin: 0.5 mg/dL (ref 0.3–1.2)
Total Protein: 7 g/dL (ref 6.5–8.1)

## 2016-09-09 LAB — GLUCOSE, CAPILLARY
GLUCOSE-CAPILLARY: 136 mg/dL — AB (ref 65–99)
GLUCOSE-CAPILLARY: 187 mg/dL — AB (ref 65–99)
Glucose-Capillary: 162 mg/dL — ABNORMAL HIGH (ref 65–99)
Glucose-Capillary: 250 mg/dL — ABNORMAL HIGH (ref 65–99)

## 2016-09-09 LAB — CBC
HCT: 41.1 % (ref 40.0–52.0)
HEMOGLOBIN: 14.2 g/dL (ref 13.0–18.0)
MCH: 30.6 pg (ref 26.0–34.0)
MCHC: 34.5 g/dL (ref 32.0–36.0)
MCV: 88.8 fL (ref 80.0–100.0)
PLATELETS: 175 10*3/uL (ref 150–440)
RBC: 4.63 MIL/uL (ref 4.40–5.90)
RDW: 13.9 % (ref 11.5–14.5)
WBC: 8.1 10*3/uL (ref 3.8–10.6)

## 2016-09-09 MED ORDER — DEXTROSE-NACL 5-0.9 % IV SOLN
INTRAVENOUS | Status: DC
  Administered 2016-09-09: via INTRAVENOUS

## 2016-09-17 ENCOUNTER — Encounter: Payer: Medicare Other | Attending: Internal Medicine | Admitting: *Deleted

## 2016-09-17 ENCOUNTER — Encounter: Payer: Self-pay | Admitting: *Deleted

## 2016-09-17 VITALS — BP 124/80 | Ht 72.0 in | Wt 227.6 lb

## 2016-09-17 DIAGNOSIS — E119 Type 2 diabetes mellitus without complications: Secondary | ICD-10-CM | POA: Diagnosis not present

## 2016-09-17 DIAGNOSIS — Z713 Dietary counseling and surveillance: Secondary | ICD-10-CM | POA: Insufficient documentation

## 2016-09-17 DIAGNOSIS — Z794 Long term (current) use of insulin: Secondary | ICD-10-CM

## 2016-09-17 DIAGNOSIS — E1142 Type 2 diabetes mellitus with diabetic polyneuropathy: Secondary | ICD-10-CM

## 2016-09-17 NOTE — Progress Notes (Signed)
Diabetes Self-Management Education  Visit Type: First/Initial  Appt. Start Time: 0915 Appt. End Time: 1025  09/17/2016  [name], identified by name and date of birth, is a 70 y.o. male with a diagnosis of Diabetes: Type 2.   ASSESSMENT  Blood pressure 124/80, height 6' (1.829 m), weight 227 lb 9.6 oz (103.2 kg). Body mass index is 30.87 kg/m.      Diabetes Self-Management Education - 09/17/16 1113      Visit Information   Visit Type First/Initial     Initial Visit   Diabetes Type Type 2   Are you currently following a meal plan? No   Are you taking your medications as prescribed? Yes   Date Diagnosed 1990     Health Coping   How would you rate your overall health? Fair     Psychosocial Assessment   Patient Belief/Attitude about Diabetes Other (comment)  "inhibited"   Self-care barriers Unsteady gait/risk for falls   Self-management support Doctor's office;Family   Patient Concerns Nutrition/Meal planning;Glycemic Control;Monitoring;Healthy Lifestyle   Special Needs None   Preferred Learning Style Auditory   Learning Readiness Ready   How often do you need to have someone help you when you read instructions, pamphlets, or other written materials from your doctor or pharmacy? 1 - Never   What is the last grade level you completed in school? college     Pre-Education Assessment   Patient understands the diabetes disease and treatment process. Needs Review   Patient understands incorporating nutritional management into lifestyle. Needs Instruction   Patient undertands incorporating physical activity into lifestyle. Needs Instruction   Patient understands using medications safely. Needs Review   Patient understands monitoring blood glucose, interpreting and using results Needs Review   Patient understands prevention, detection, and treatment of acute complications. Needs Review   Patient understands prevention, detection, and treatment of chronic  complications. Needs Review   Patient understands how to develop strategies to address psychosocial issues. Needs Instruction   Patient understands how to develop strategies to promote health/change behavior. Needs Instruction     Complications   Last HgB A1C per patient/outside source 13.3 %  06/27/16   How often do you check your blood sugar? 3-4 times/day   Fasting Blood glucose range (mg/dL) >200  Pt didn't bring meter or records. He reports FBG's 200's mg/dL.    Postprandial Blood glucose range (mg/dL) --  He reports checking before lunch with readings of 110-170's mg/dL and before supper with readings of 100-200's mg/dL.    Have you had a dilated eye exam in the past 12 months? Yes   Have you had a dental exam in the past 12 months? Yes   Are you checking your feet? Yes   How many days per week are you checking your feet? 7     Dietary Intake   Breakfast oatmeal   Snack (morning) nuts   Lunch [place] sandwich, hamburger, fish sandwich, left overs    Dinner meat loaf, pork chops, [place] and gravy, salads   Snack (evening) nuts   Beverage(s) water, coffee, Crystal lite, occasional diet soda     Exercise   Exercise Type ADL's     Patient Education   Previous Diabetes Education --  at the [place]   Disease state  Explored patient's options for treatment of their diabetes   Nutrition management  Food label reading, portion sizes and measuring food.;Meal timing in regards to the patients' current diabetes medication.   Physical activity and  exercise  Helped patient identify appropriate exercises in relation to his/her diabetes, diabetes complications and other health issue.;Role of exercise on diabetes management, blood pressure control and cardiac health.   Medications Taught/reviewed insulin injection, site rotation, insulin storage and needle disposal.;Reviewed patients medication for diabetes, action, purpose, timing of dose and side effects.  Pt reported pressure when getting insulin  from vial. He wasn't putting air in first before drawing out insulin.    Monitoring Taught/discussed recording of test results and interpretation of SMBG.;Identified appropriate SMBG and/or A1C goals.   Acute complications Taught treatment of hypoglycemia - the 15 rule.   Chronic complications Relationship between chronic complications and blood glucose control   Psychosocial adjustment Role of stress on diabetes;Identified and addressed patients feelings and concerns about diabetes Pt's wife died within the last month and he was her caregiver.     Individualized Goals (developed by patient)   Reducing Risk Improve blood sugars  Prevent diabetes complications Lead a healthier lifestyle Become more fit     Outcomes   Expected Outcomes Demonstrated interest in learning. Expect positive outcomes   Future DMSE 2 wks      Individualized Plan for Diabetes Self-Management Training:   Learning Objective:  Patient will have a greater understanding of diabetes self-management. Patient education plan is to attend individual and/or group sessions per assessed needs and concerns.   Plan:   Patient Instructions  Check blood sugars 3-4 x day before each meal and before bed every day Walk as tolerated Eat 3 meals day,   2-3  snacks a day Space meals 4-6 hours apart Add small serving of protein with breakfast Complete 3 Day Food Record and bring to next appt Bring blood sugar records to the next appointment Carry fast acting glucose and a snack at all times Rotate injection sites Return for appointment on:  Tuesday October 06, 2016 at 9:15 am with [facility] (dietitian)   Expected Outcomes:  Demonstrated interest in learning. Expect positive outcomes  Education material provided:  General Meal Planning Guidelines Simple Meal Plan 3 Day Food Record Glucose tablets Symptoms, causes and treatments of Hypoglycemia Getting Started on Insulin (BD)  If problems or questions, patient to contact team  via:  Johny Drilling, [place], [place], CDE [phone]  Future DSME appointment: 2 wks  Tuesday October 06, 2016 with dietitian

## 2016-09-17 NOTE — Patient Instructions (Addendum)
Check blood sugars 3-4 x day before each meal and before bed every day  Walk as tolerated  Eat 3 meals day,   2-3  snacks a day Space meals 4-6 hours apart Add small serving of protein with breakfast Complete 3 Day Food Record and bring to next appt  Bring blood sugar records to the next appointment  Carry fast acting glucose and a snack at all times Rotate injection sites  Return for appointment on:  Tuesday October 06, 2016 at 9:15 am with [facility] (dietitian)

## 2016-10-06 ENCOUNTER — Encounter: Payer: Medicare Other | Attending: Internal Medicine | Admitting: Dietician

## 2016-10-06 ENCOUNTER — Encounter: Payer: Self-pay | Admitting: Dietician

## 2016-10-06 VITALS — BP 130/76 | Ht 72.0 in | Wt 225.1 lb

## 2016-10-06 DIAGNOSIS — Z713 Dietary counseling and surveillance: Secondary | ICD-10-CM | POA: Insufficient documentation

## 2016-10-06 DIAGNOSIS — E119 Type 2 diabetes mellitus without complications: Secondary | ICD-10-CM | POA: Diagnosis not present

## 2016-10-06 DIAGNOSIS — Z794 Long term (current) use of insulin: Secondary | ICD-10-CM

## 2016-10-06 DIAGNOSIS — E1142 Type 2 diabetes mellitus with diabetic polyneuropathy: Secondary | ICD-10-CM

## 2016-10-06 NOTE — Patient Instructions (Signed)
   Use menus provided for balanced meal ideas.

## 2016-10-06 NOTE — Progress Notes (Signed)
Diabetes Self-Management Education  Visit Type:  Follow-up  Appt. Start Time: 0915 Appt. End Time: 1030  10/06/2016  [name], identified by name and date of birth, is a 70 y.o. male with a diagnosis of Diabetes:  .   ASSESSMENT  Blood pressure 130/76, height 6' (1.829 m), weight 225 lb 1.6 oz (102.1 kg). Body mass index is 30.53 kg/m.       Diabetes Self-Management Education - [id number]      Complications   How often do you check your blood sugar? 3-4 times/day   Fasting Blood glucose range (mg/dL) >200;180-200;130-179;70-129   Postprandial Blood glucose range (mg/dL) 130-179;180-200;>200;70-129  BEFORE meals   Have you had a dilated eye exam in the past 12 months? Yes  had appt at [facility] [place] cancelled and patient has called to reschedule, waiting for return call   Have you had a dental exam in the past 12 months? Yes   Are you checking your feet? Yes   How many days per week are you checking your feet? 7     Dietary Intake   Breakfast 3 meals and 2 snacks daily     Exercise   Exercise Type ADL's  has not yet resumed exercise     Patient Education   Nutrition management  Role of diet in the treatment of diabetes and the relationship between the three main macronutrients and blood glucose level;Food label reading, portion sizes and measuring food.;Meal options for control of blood glucose level and chronic complications.;Other (comment)  basic meal planning using plate method, menus   Monitoring Taught/discussed recording of test results and interpretation of SMBG.     Post-Education Assessment   Patient understands the diabetes disease and treatment process. Demonstrates understanding / competency   Patient understands incorporating nutritional management into lifestyle. Demonstrates understanding / competency   Patient undertands incorporating physical activity into lifestyle. Demonstrates understanding / competency   Patient understands using  medications safely. Demonstrates understanding / competency   Patient understands monitoring blood glucose, interpreting and using results Demonstrates understanding / competency   Patient understands prevention, detection, and treatment of acute complications. Demonstrates understanding / competency   Patient understands prevention, detection, and treatment of chronic complications. Demonstrates understanding / competency   Patient understands how to develop strategies to address psychosocial issues. Demonstrates understanding / competency   Patient understands how to develop strategies to promote health/change behavior. Demonstrates understanding / competency     Outcomes   Program Status Completed      Learning Objective:  Patient will have a greater understanding of diabetes self-management.  Patient is working to make healthy diet changes. He requested help with meal ideas, as he is learning to cook for one now that his wife has passed away. He has a supportive son that lives close by, and a supportive daughter in [place].   Plan:   Patient Instructions   Use menus provided for balanced meal ideas.     Expected Outcomes:  Demonstrated interest in learning. Expect positive outcomes  Education material provided: Planning A Balanced Meal; Quick and Healthy Meal Ideas, Individualized menus, Smart Snacking  If problems or questions, patient to contact team via:  Phone

## 2016-10-13 DIAGNOSIS — I2089 Other forms of angina pectoris: Secondary | ICD-10-CM

## 2016-10-13 DIAGNOSIS — I208 Other forms of angina pectoris: Secondary | ICD-10-CM

## 2016-10-28 ENCOUNTER — Encounter: Payer: Self-pay | Admitting: *Deleted

## 2016-10-28 ENCOUNTER — Encounter
Admission: RE | Disposition: A | Discharge status: Home / Self Care | Payer: Self-pay | Source: Ambulatory Visit | Attending: Internal Medicine

## 2016-10-28 ENCOUNTER — Ambulatory Visit
Admission: RE | Admit: 2016-10-28 | Discharge: 2016-10-28 | Disposition: A | Discharge status: Home / Self Care | Payer: Medicare Other | Source: Ambulatory Visit | Attending: Internal Medicine | Admitting: Internal Medicine

## 2016-10-28 DIAGNOSIS — I208 Other forms of angina pectoris: Secondary | ICD-10-CM

## 2016-10-28 DIAGNOSIS — E782 Mixed hyperlipidemia: Secondary | ICD-10-CM | POA: Diagnosis not present

## 2016-10-28 DIAGNOSIS — Z79899 Other long term (current) drug therapy: Secondary | ICD-10-CM | POA: Insufficient documentation

## 2016-10-28 DIAGNOSIS — Z794 Long term (current) use of insulin: Secondary | ICD-10-CM | POA: Diagnosis not present

## 2016-10-28 DIAGNOSIS — I2089 Other forms of angina pectoris: Secondary | ICD-10-CM

## 2016-10-28 DIAGNOSIS — N400 Enlarged prostate without lower urinary tract symptoms: Secondary | ICD-10-CM | POA: Insufficient documentation

## 2016-10-28 DIAGNOSIS — Z8673 Personal history of transient ischemic attack (TIA), and cerebral infarction without residual deficits: Secondary | ICD-10-CM | POA: Insufficient documentation

## 2016-10-28 DIAGNOSIS — Z8249 Family history of ischemic heart disease and other diseases of the circulatory system: Secondary | ICD-10-CM | POA: Insufficient documentation

## 2016-10-28 DIAGNOSIS — E1142 Type 2 diabetes mellitus with diabetic polyneuropathy: Secondary | ICD-10-CM | POA: Insufficient documentation

## 2016-10-28 DIAGNOSIS — G2581 Restless legs syndrome: Secondary | ICD-10-CM | POA: Insufficient documentation

## 2016-10-28 DIAGNOSIS — Z7982 Long term (current) use of aspirin: Secondary | ICD-10-CM | POA: Insufficient documentation

## 2016-10-28 DIAGNOSIS — F4310 Post-traumatic stress disorder, unspecified: Secondary | ICD-10-CM | POA: Insufficient documentation

## 2016-10-28 DIAGNOSIS — I2510 Atherosclerotic heart disease of native coronary artery without angina pectoris: Secondary | ICD-10-CM

## 2016-10-28 DIAGNOSIS — Z7902 Long term (current) use of antithrombotics/antiplatelets: Secondary | ICD-10-CM | POA: Insufficient documentation

## 2016-10-28 DIAGNOSIS — I25119 Atherosclerotic heart disease of native coronary artery with unspecified angina pectoris: Secondary | ICD-10-CM | POA: Diagnosis present

## 2016-10-28 DIAGNOSIS — I10 Essential (primary) hypertension: Secondary | ICD-10-CM | POA: Insufficient documentation

## 2016-10-28 HISTORY — PX: CARDIAC CATHETERIZATION: SHX172

## 2016-10-28 HISTORY — DX: Atherosclerotic heart disease of native coronary artery without angina pectoris: I25.10

## 2016-10-28 LAB — GLUCOSE, CAPILLARY: GLUCOSE-CAPILLARY: 274 mg/dL — AB (ref 65–99)

## 2016-10-28 SURGERY — LEFT HEART CATH AND CORONARY ANGIOGRAPHY
Anesthesia: Moderate Sedation | Laterality: Left

## 2016-10-28 MED ORDER — SODIUM CHLORIDE 0.9% FLUSH: 3.0000 mL | INTRAVENOUS | Status: DC | PRN

## 2016-10-28 MED ORDER — ASPIRIN 81 MG PO CHEW
81.0000 mg | CHEWABLE_TABLET | ORAL | Status: DC
Start: 2016-10-29 — End: 2016-10-28

## 2016-10-28 MED ORDER — SODIUM CHLORIDE 0.9% FLUSH
3.0000 mL | Freq: Two times a day (BID) | INTRAVENOUS | Status: DC
Start: 2016-10-28 — End: 2016-10-28

## 2016-10-28 MED ORDER — IOPAMIDOL (ISOVUE-300) INJECTION 61%
INTRAVENOUS | Status: DC | PRN
  Administered 2016-10-28: 130 mL via INTRA_ARTERIAL

## 2016-10-28 MED ORDER — SODIUM CHLORIDE 0.9 % IV SOLN: 250.0000 mL | INTRAVENOUS | Status: DC | PRN

## 2016-10-28 MED ORDER — SODIUM CHLORIDE 0.9% FLUSH: 3.0000 mL | Freq: Two times a day (BID) | INTRAVENOUS | Status: DC

## 2016-10-28 MED ORDER — SODIUM CHLORIDE 0.9 % WEIGHT BASED INFUSION: 3.0000 mL/kg/h | INTRAVENOUS | Status: DC

## 2016-10-28 MED ORDER — SODIUM CHLORIDE 0.9 % WEIGHT BASED INFUSION: 1.0000 mL/kg/h | INTRAVENOUS | Status: DC

## 2016-10-28 MED ORDER — ONDANSETRON HCL 4 MG/2ML IJ SOLN: 4.0000 mg | Freq: Four times a day (QID) | INTRAMUSCULAR | Status: DC | PRN

## 2016-10-28 MED ORDER — ACETAMINOPHEN 325 MG PO TABS: 650.0000 mg | ORAL_TABLET | ORAL | Status: DC | PRN

## 2016-10-28 MED ORDER — ASPIRIN 81 MG PO CHEW: 81.0000 mg | CHEWABLE_TABLET | ORAL | 4 refills | Status: DC

## 2016-10-28 MED ORDER — MIDAZOLAM HCL 2 MG/2ML IJ SOLN
INTRAMUSCULAR | Status: AC
  Filled 2016-10-28: qty 2

## 2016-10-28 MED ORDER — FENTANYL CITRATE (PF) 100 MCG/2ML IJ SOLN
INTRAMUSCULAR | Status: AC
  Filled 2016-10-28: qty 2

## 2016-10-28 MED ORDER — FENTANYL CITRATE (PF) 100 MCG/2ML IJ SOLN
INTRAMUSCULAR | Status: DC | PRN
  Administered 2016-10-28: 50 ug via INTRAVENOUS

## 2016-10-28 MED ORDER — HEPARIN (PORCINE) IN NACL 2-0.9 UNIT/ML-% IJ SOLN
INTRAMUSCULAR | Status: AC
  Filled 2016-10-28: qty 500

## 2016-10-28 MED ORDER — MIDAZOLAM HCL 2 MG/2ML IJ SOLN
INTRAMUSCULAR | Status: DC | PRN
  Administered 2016-10-28: 1 mg via INTRAVENOUS

## 2016-10-28 SURGICAL SUPPLY — 8 items
CATH INFINITI 5FR ANG PIGTAIL (CATHETERS) ×3 IMPLANT
CATH INFINITI 5FR JL4 (CATHETERS) ×3 IMPLANT
CATH INFINITI JR4 5F (CATHETERS) ×3 IMPLANT
GUIDEWIRE 3MM J TIP .035 145 (WIRE) ×3 IMPLANT
KIT MANI 3VAL PERCEP (MISCELLANEOUS) ×3 IMPLANT
NEEDLE PERC 18GX7CM (NEEDLE) ×3 IMPLANT
PACK CARDIAC CATH (CUSTOM PROCEDURE TRAY) ×3 IMPLANT
SHEATH PINNACLE 5F 10CM (SHEATH) ×3 IMPLANT

## 2016-10-28 NOTE — Discharge Instructions (Signed)
Angiogram, Care After Refer to this sheet in the next few weeks. These instructions provide you with information about caring for yourself after your procedure. Your health care provider may also give you more specific instructions. Your treatment has been planned according to current medical practices, but problems sometimes occur. Call your health care provider if you have any problems or questions after your procedure. WHAT TO EXPECT AFTER THE PROCEDURE After your procedure, it is typical to have the following:  Bruising at the catheter insertion site that usually fades within 1-2 weeks.  Blood collecting in the tissue (hematoma) that may be painful to the touch. It should usually decrease in size and tenderness within 1-2 weeks. HOME CARE INSTRUCTIONS  Take medicines only as directed by your health care provider.  You may shower 24-48 hours after the procedure or as directed by your health care provider. Remove the bandage (dressing) and gently wash the site with plain soap and water. Pat the area dry with a clean towel. Do not rub the site, because this may cause bleeding.  Do not take baths, swim, or use a hot tub until your health care provider approves.  Check your insertion site every day for redness, swelling, or drainage.  Do not apply powder or lotion to the site.  Do not lift over 10 lb (4.5 kg) for 5 days after your procedure or as directed by your health care provider.  Ask your health care provider when it is okay to:  Return to work or school.  Resume usual physical activities or sports.  Resume sexual activity.  Do not drive home if you are discharged the same day as the procedure. Have someone else drive you.  You may drive 24 hours after the procedure unless otherwise instructed by your health care provider.  Do not operate machinery or power tools for 24 hours after the procedure or as directed by your health care provider.  If your procedure was done as an  outpatient procedure, which means that you went home the same day as your procedure, a responsible adult should be with you for the first 24 hours after you arrive home.  Keep all follow-up visits as directed by your health care provider. This is important. SEEK MEDICAL CARE IF:  You have a fever.  You have chills.  You have increased bleeding from the catheter insertion site. Hold pressure on the site. SEEK IMMEDIATE MEDICAL CARE IF:  You have unusual pain at the catheter insertion site.  You have redness, warmth, or swelling at the catheter insertion site.  You have drainage (other than a small amount of blood on the dressing) from the catheter insertion site.  The catheter insertion site is bleeding, and the bleeding does not stop after 30 minutes of holding steady pressure on the site.  The area near or just beyond the catheter insertion site becomes pale, cool, tingly, or numb.   This information is not intended to replace advice given to you by your health care provider. Make sure you discuss any questions you have with your health care provider.  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   Document Released: 07/02/2005 Document Revised: 01/04/2015 Document Reviewed: 05/17/2013 Elsevier Interactive Patient Education [employer].

## 2016-11-05 DIAGNOSIS — Z951 Presence of aortocoronary bypass graft: Secondary | ICD-10-CM

## 2016-11-05 HISTORY — DX: Presence of aortocoronary bypass graft: Z95.1

## 2016-11-12 DIAGNOSIS — I25810 Atherosclerosis of coronary artery bypass graft(s) without angina pectoris: Secondary | ICD-10-CM | POA: Insufficient documentation

## 2016-11-12 DIAGNOSIS — I2510 Atherosclerotic heart disease of native coronary artery without angina pectoris: Secondary | ICD-10-CM | POA: Insufficient documentation

## 2017-02-11 ENCOUNTER — Encounter: Payer: Medicare Other | Attending: Internal Medicine | Admitting: *Deleted

## 2017-02-11 VITALS — Ht 72.0 in | Wt 238.1 lb

## 2017-02-11 DIAGNOSIS — Z951 Presence of aortocoronary bypass graft: Secondary | ICD-10-CM | POA: Diagnosis present

## 2017-02-11 IMAGING — CT CT HEAD W/O CM
3 series · 15 of 47 positions shown, 18 images · non-contrast
Comparison: 06/27/2016

CLINICAL DATA: Headache

EXAM:
CT HEAD WITHOUT CONTRAST
TECHNIQUE: Contiguous axial images were obtained from the base of the skull
through the vertex without intravenous contrast.

[Series 2: head wo · axial · 0.40mm/px · z∈[+70,+194]mm · 9 of 30 slices shown, 12 images]
[im 3/30  brain]
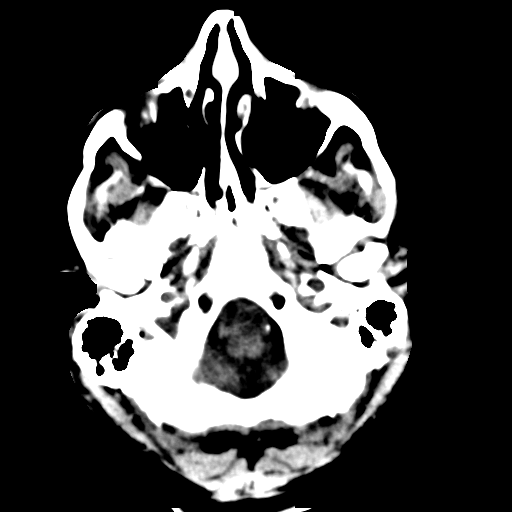
[im 3/30  bone]
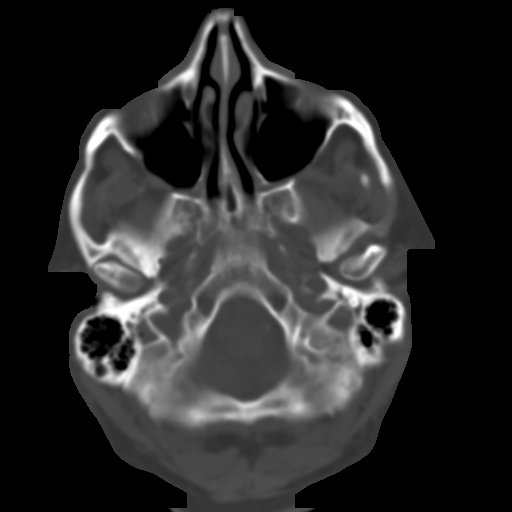
[im 6/30  brain]
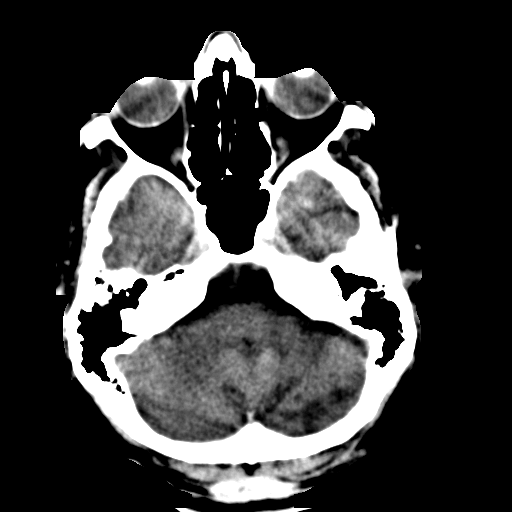
[im 9/30  brain]
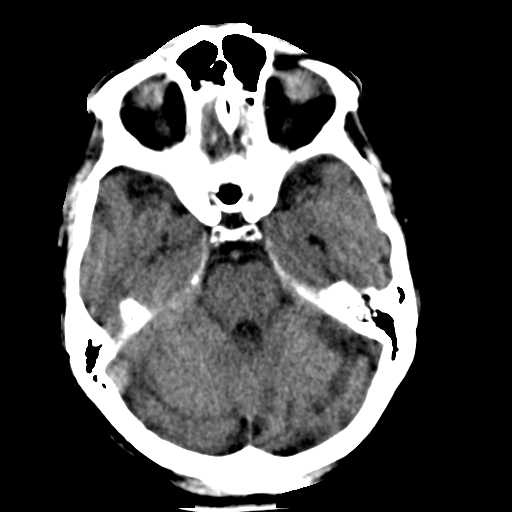
[im 12/30  brain]
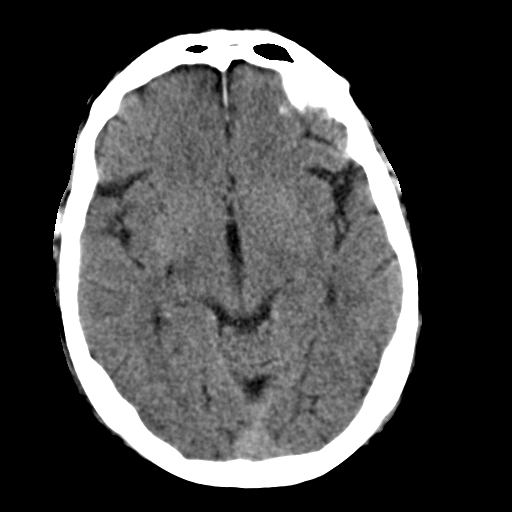
[im 16/30  brain]
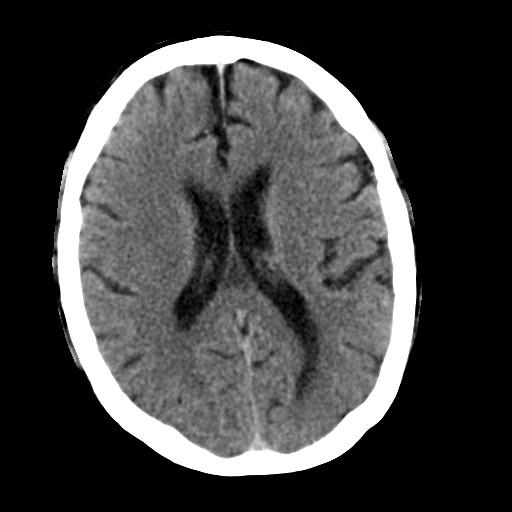
[im 16/30  bone]
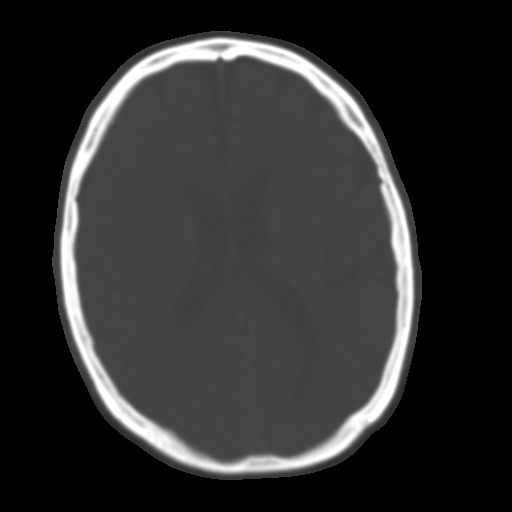
[im 19/30  brain]
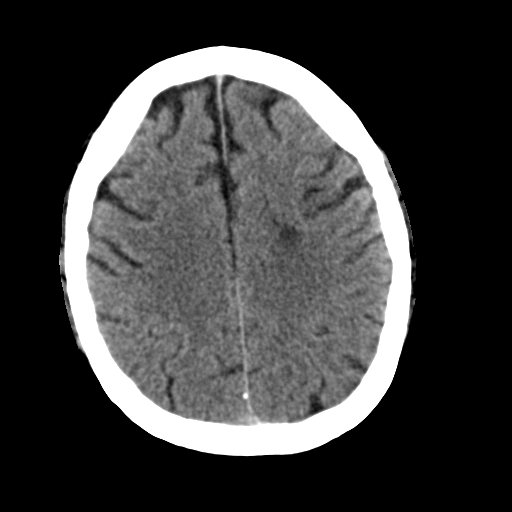
[im 22/30  brain]
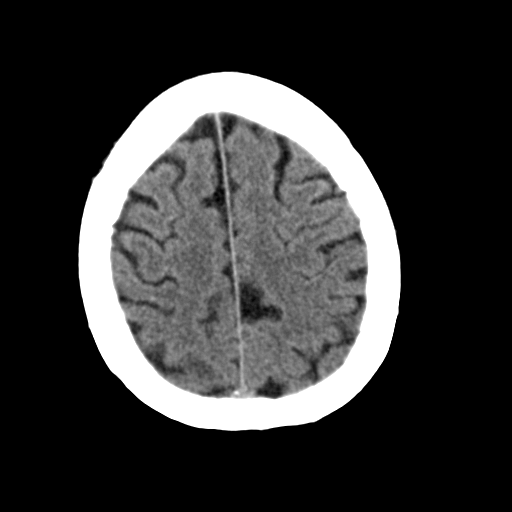
[im 25/30  brain]
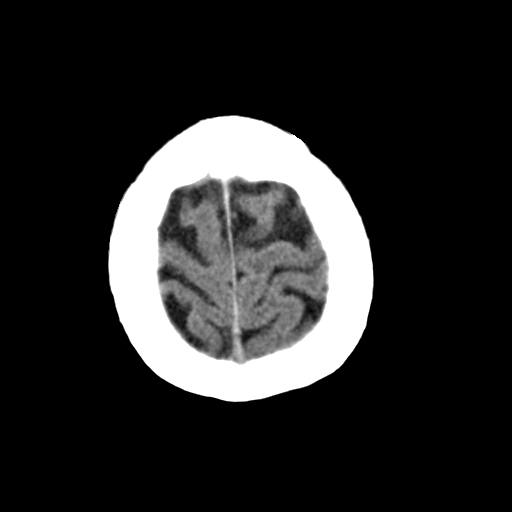
[im 28/30  brain]
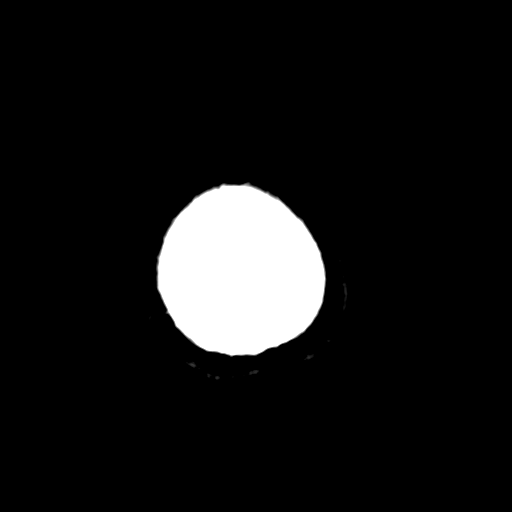
[im 28/30  bone]
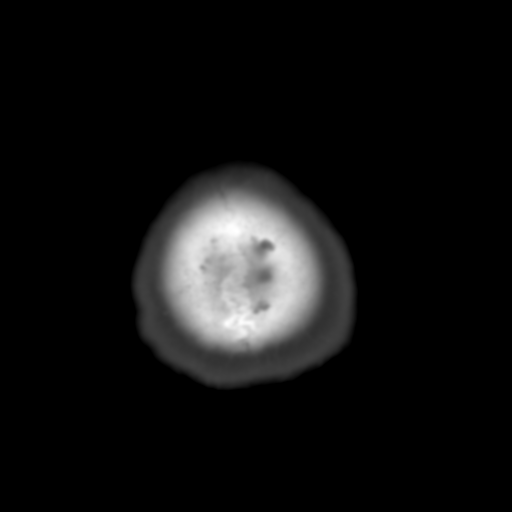

[Series 4: coronal soft · coronal · 0.30mm/px · 3 of 63 slices shown]
[im 21/63  brain]
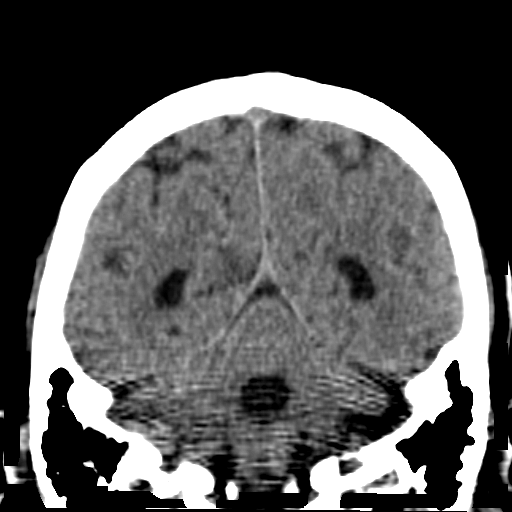
[im 28/63  brain]
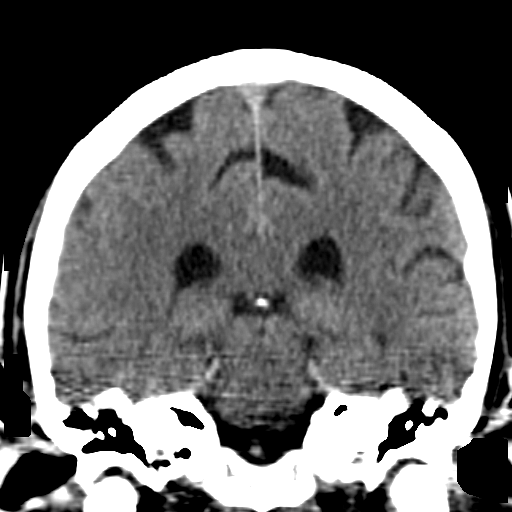
[im 35/63  brain]
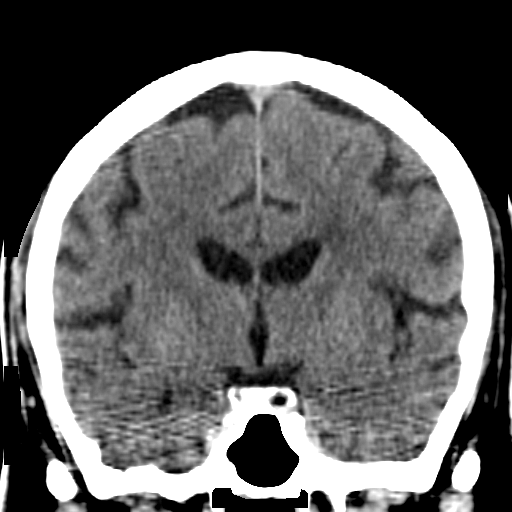

[Series 5: sagittal soft · sagittal · 0.26mm/px · 3 of 53 slices shown]
[im 18/53  brain]
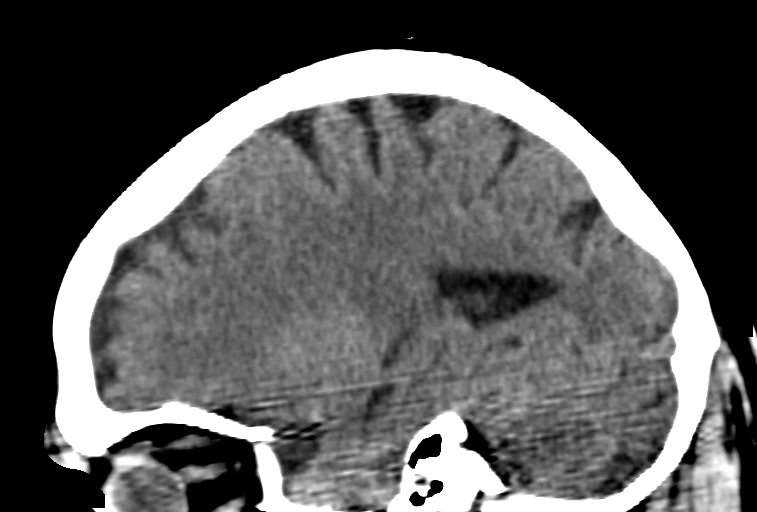
[im 27/53  brain]
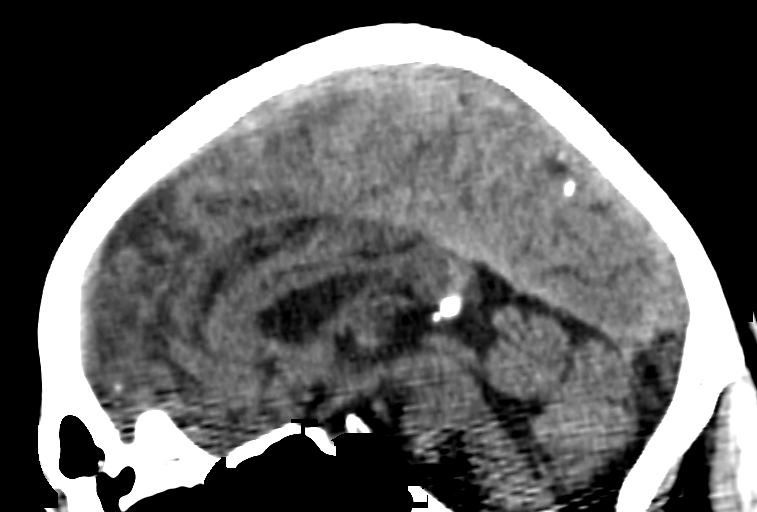
[im 35/53  brain]
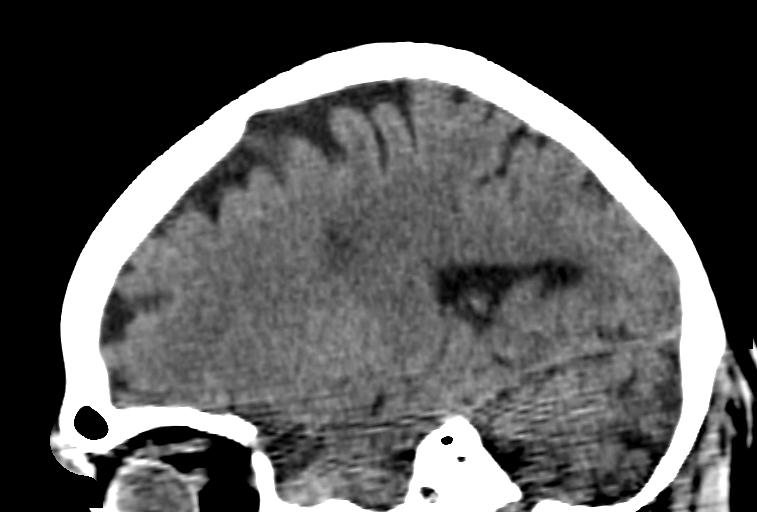

[15 of 47 positions shown; findings below may reference images not displayed]

FINDINGS: Brain: Mild prominence of the sulci and ventricles noted. No
evidence of acute infarction, hemorrhage, extra-axial collection,
ventriculomegaly, or mass effect.

Vascular: No hyperdense vessel or unexpected calcification.

Skull: Negative for fracture or focal lesion.

Sinuses/Orbits: No acute findings.

Other: None.
IMPRESSION: 1. No acute intracranial abnormalities.

## 2017-02-11 NOTE — Patient Instructions (Signed)
Patient Instructions  Patient Details  Name: [name] MRN: [id number] Date of Birth: 29-Mar-1946 Referring Provider:  Corey Skains, MD  Below are the personal goals you chose as well as exercise and nutrition goals. Our goal is to help you keep on track towards obtaining and maintaining your goals. We will be discussing your progress on these goals with you throughout the program.  Initial Exercise Prescription:     Initial Exercise Prescription - 02/11/17 1400      Date of Initial Exercise RX and Referring Provider   Date 02/11/17   Referring Provider Serafina Royals MD     Treadmill   MPH 1.5   Grade 0   Minutes 15   METs 1.7     NuStep   Level 1   Minutes 15   METs 1.5     Biostep-RELP   Level 1   Minutes 15   METs 1     Prescription Details   Frequency (times per week) 3   Duration Progress to 45 minutes of aerobic exercise without signs/symptoms of physical distress     Intensity   THRR 40-80% of Max Heartrate 107-135   Ratings of Perceived Exertion 11-13   Perceived Dyspnea 0-4     Progression   Progression Continue to progress workloads to maintain intensity without signs/symptoms of physical distress.     Resistance Training   Training Prescription Yes   Weight 3 lbs   Reps 10-15      Exercise Goals: Frequency: Be able to perform aerobic exercise three times per week working toward 3-5 days per week.  Intensity: Work with a perceived exertion of 11 (fairly light) - 15 (hard) as tolerated. Follow your new exercise prescription and watch for changes in prescription as you progress with the program. Changes will be reviewed with you when they are made.  Duration: You should be able to do 30 minutes of continuous aerobic exercise in addition to a 5 minute warm-up and a 5 minute cool-down routine.  Nutrition Goals: Your personal nutrition goals will be established when you do your nutrition analysis with the dietician.  The following  are nutrition guidelines to follow: Cholesterol < 200mg /day Sodium < 1500mg /day Fiber: Men over 50 yrs - 30 grams per day  Personal Goals:     Personal Goals and Risk Factors at Admission - 02/11/17 1512      Core Components/Risk Factors/Patient Goals on Admission    Weight Management Obesity;Weight Gain;Yes   Intervention Weight Management: Develop a combined nutrition and exercise program designed to reach desired caloric intake, while maintaining appropriate intake of nutrient and fiber, sodium and fats, and appropriate energy expenditure required for the weight goal.;Weight Management: Provide education and appropriate resources to help participant work on and attain dietary goals.;Weight Management/Obesity: Establish reasonable short term and long term weight goals.;Obesity: Provide education and appropriate resources to help participant work on and attain dietary goals.   Admit Weight 238 lb 1.6 oz (108 kg)   Goal Weight: Short Term 233 lb (105.7 kg)   Goal Weight: Long Term 200 lb (90.7 kg)   Expected Outcomes Short Term: Continue to assess and modify interventions until short term weight is achieved;Long Term: Adherence to nutrition and physical activity/exercise program aimed toward attainment of established weight goal;Weight Loss: Understanding of general recommendations for a balanced deficit meal plan, which promotes 1-2 lb weight loss per week and includes a negative energy balance of [phone] kcal/d   Sedentary Yes  Intervention Provide advice, education, support and counseling about physical activity/exercise needs.;Develop an individualized exercise prescription for aerobic and resistive training based on initial evaluation findings, risk stratification, comorbidities and participant's personal goals.   Expected Outcomes Achievement of increased cardiorespiratory fitness and enhanced flexibility, muscular endurance and strength shown through measurements of functional capacity  and personal statement of participant.   Increase Strength and Stamina Yes   Intervention Provide advice, education, support and counseling about physical activity/exercise needs.;Develop an individualized exercise prescription for aerobic and resistive training based on initial evaluation findings, risk stratification, comorbidities and participant's personal goals.   Expected Outcomes Achievement of increased cardiorespiratory fitness and enhanced flexibility, muscular endurance and strength shown through measurements of functional capacity and personal statement of participant.   Improve shortness of breath with ADL's Yes   Intervention Provide education, individualized exercise plan and daily activity instruction to help decrease symptoms of SOB with activities of daily living.   Expected Outcomes Short Term: Achieves a reduction of symptoms when performing activities of daily living.   Diabetes Yes   Intervention Provide education about signs/symptoms and action to take for hypo/hyperglycemia.;Provide education about proper nutrition, including hydration, and aerobic/resistive exercise prescription along with prescribed medications to achieve blood glucose in normal ranges: Fasting glucose 65-99 mg/dL   Expected Outcomes Short Term: Participant verbalizes understanding of the signs/symptoms and immediate care of hyper/hypoglycemia, proper foot care and importance of medication, aerobic/resistive exercise and nutrition plan for blood glucose control.;Long Term: Attainment of HbA1C < 7%.   Hypertension Yes   Intervention Provide education on lifestyle modifcations including regular physical activity/exercise, weight management, moderate sodium restriction and increased consumption of fresh fruit, vegetables, and low fat dairy, alcohol moderation, and smoking cessation.;Monitor prescription use compliance.   Expected Outcomes Short Term: Continued assessment and intervention until BP is < 140/64mm HG  in hypertensive participants. < 130/84mm HG in hypertensive participants with diabetes, heart failure or chronic kidney disease.;Long Term: Maintenance of blood pressure at goal levels.   Lipids Yes   Intervention Provide education and support for participant on nutrition & aerobic/resistive exercise along with prescribed medications to achieve LDL 70mg , HDL >40mg .   Expected Outcomes Short Term: Participant states understanding of desired cholesterol values and is compliant with medications prescribed. Participant is following exercise prescription and nutrition guidelines.;Long Term: Cholesterol controlled with medications as prescribed, with individualized exercise RX and with personalized nutrition plan. Value goals: LDL < 70mg , HDL > 40 mg.      Tobacco Use Initial Evaluation: History  Smoking Status  . Never Smoker  Smokeless Tobacco  . Never Used    Copy of goals given to participant.

## 2017-02-11 NOTE — Progress Notes (Signed)
Cardiac Individual Treatment Plan  Patient Details  Name: [name] MRN: [id number] Date of Birth: 08-Mar-1946 Referring Provider:   Flowsheet Row Cardiac Rehab from 02/11/2017 in [facility] Cardiac and Pulmonary Rehab  Referring Provider  Serafina Royals MD      Initial Encounter Date:  Flowsheet Row Cardiac Rehab from 02/11/2017 in [facility] Cardiac and Pulmonary Rehab  Date  02/11/17  Referring Provider  Serafina Royals MD      Visit Diagnosis: S/P CABG x 3  Patient's Home Medications on Admission:  Current Outpatient Prescriptions:  .  aspirin 81 MG chewable tablet, Chew 1 tablet (81 mg total) by mouth before cath procedure., Disp: 90 tablet, Rfl: 4 .  cholecalciferol (VITAMIN D) 1000 UNITS tablet, Take 1,000 Units by mouth daily., Disp: , Rfl:  .  finasteride (PROSCAR) 5 MG tablet, Take 5 mg by mouth daily., Disp: , Rfl:  .  insulin aspart (NOVOLOG) 100 UNIT/ML injection, Inject 20 Units into the skin 3 (three) times daily before meals., Disp: , Rfl:  .  insulin glargine (LANTUS) 100 UNIT/ML injection, Inject 75 Units into the skin at bedtime., Disp: , Rfl:  .  metFORMIN (GLUCOPHAGE) 850 MG tablet, Take 850 mg by mouth 3 (three) times daily., Disp: , Rfl:  .  nortriptyline (PAMELOR) 10 MG capsule, Take 30 mg by mouth at bedtime., Disp: , Rfl:  .  PARoxetine (PAXIL) 40 MG tablet, Take 40 mg by mouth every morning., Disp: , Rfl:  .  prazosin (MINIPRESS) 5 MG capsule, Take 5 mg by mouth 2 (two) times daily. , Disp: , Rfl:  .  rOPINIRole (REQUIP) 0.5 MG tablet, Take 0.5 mg by mouth at bedtime., Disp: , Rfl:  .  simvastatin (ZOCOR) 40 MG tablet, Take 40 mg by mouth at bedtime., Disp: , Rfl:  .  tamsulosin (FLOMAX) 0.4 MG CAPS capsule, Take 0.4 mg by mouth daily., Disp: , Rfl:  .  tiZANidine (ZANAFLEX) 2 MG tablet, Take 2 mg by mouth at bedtime., Disp: , Rfl:  .  vitamin B-12 (CYANOCOBALAMIN) 1000 MCG tablet, Take 1,000 mcg by mouth at bedtime., Disp: , Rfl:  .  lisinopril  (PRINIVIL,ZESTRIL) 20 MG tablet, Take 20 mg by mouth daily., Disp: , Rfl:  .  sucralfate (CARAFATE) 1 g tablet, Take 1 tablet (1 g total) by mouth 4 (four) times daily. (Patient not taking: Reported on 10/28/2016), Disp: 60 tablet, Rfl: 0 .  traMADol-acetaminophen (ULTRACET) 37.5-325 MG tablet, Take 1 tablet by mouth every 6 (six) hours as needed. , Disp: , Rfl:   Past Medical History: Past Medical History:  Diagnosis Date  . BPH (benign prostatic hyperplasia)   . Depression   . Diabetes mellitus without complication ([place])   . HLD (hyperlipidemia)   . Hypertension   . Neuropathy ([place])   . PTSD (post-traumatic stress disorder)   . Stroke [facility])     Tobacco Use: History  Smoking Status  . Never Smoker  Smokeless Tobacco  . Never Used    Labs: Recent Review Flowsheet Data    Labs for ITP Cardiac and Pulmonary Rehab Latest Ref Rng & Units 06/27/2016 06/28/2016   Cholestrol 0 - 200 mg/dL - 200   LDLCALC 0 - 99 mg/dL - UNABLE TO CALCULATE IF TRIGLYCERIDE OVER 400 mg/dL   HDL >40 mg/dL - 29(L)   Trlycerides <150 mg/dL - 515(H)   Hemoglobin A1c 4.0 - 6.0 % 13.3(H) -       Exercise Target Goals: Date: 02/11/17  Exercise Program Goal:  Individual exercise prescription set with THRR, safety & activity barriers. Participant demonstrates ability to understand and report RPE using BORG scale, to self-measure pulse accurately, and to acknowledge the importance of the exercise prescription.  Exercise Prescription Goal: Starting with aerobic activity 30 plus minutes a day, 3 days per week for initial exercise prescription. Provide home exercise prescription and guidelines that participant acknowledges understanding prior to discharge.  Activity Barriers & Risk Stratification:     Activity Barriers & Cardiac Risk Stratification - 02/11/17 1536      Activity Barriers & Cardiac Risk Stratification   Activity Barriers Other (comment)   Comments R knee "neuropathy" per patient report.  States  he uses a cane as a "crutch".  Has struggled with R knee for approx 3 years.    Cardiac Risk Stratification High      6 Minute Walk:     6 Minute Walk    Row Name 02/11/17 1456         6 Minute Walk   Phase Initial     Distance 785 feet     Walk Time 5.83 minutes     # of Rest Breaks 1  10 sec     MPH 1.5     METS 1.75     RPE 13     VO2 Peak 6.12     Symptoms Yes (comment)     Comments complaint of left hip pain 3/10     Resting HR 80 bpm     Resting BP 122/70     Max Ex. HR 109 bpm     Max Ex. BP 126/64        Initial Exercise Prescription:     Initial Exercise Prescription - 02/11/17 1400      Date of Initial Exercise RX and Referring Provider   Date 02/11/17   Referring Provider Serafina Royals MD     Treadmill   MPH 1.5   Grade 0   Minutes 15   METs 1.7     NuStep   Level 1   Minutes 15   METs 1.5     Biostep-RELP   Level 1   Minutes 15   METs 1     Prescription Details   Frequency (times per week) 3   Duration Progress to 45 minutes of aerobic exercise without signs/symptoms of physical distress     Intensity   THRR 40-80% of Max Heartrate 107-135   Ratings of Perceived Exertion 11-13   Perceived Dyspnea 0-4     Progression   Progression Continue to progress workloads to maintain intensity without signs/symptoms of physical distress.     Resistance Training   Training Prescription Yes   Weight 3 lbs   Reps 10-15      Perform Capillary Blood Glucose checks as needed.  Exercise Prescription Changes:     Exercise Prescription Changes    Row Name 02/11/17 1400             Exercise Review   Progression -  walk test results         Response to Exercise   Blood Pressure (Admit) 122/70       Blood Pressure (Exercise) 126/64       Heart Rate (Admit) 80 bpm       Heart Rate (Exercise) 109 bpm       Heart Rate (Exit) 85 bpm       Oxygen Saturation (Admit) 97 %  Oxygen Saturation (Exercise) 96 %       Rating of  Perceived Exertion (Exercise) 13       Symptoms left hip pain 3/10          Exercise Comments:   Discharge Exercise Prescription (Final Exercise Prescription Changes):     Exercise Prescription Changes - 02/11/17 1400      Exercise Review   Progression --  walk test results     Response to Exercise   Blood Pressure (Admit) 122/70   Blood Pressure (Exercise) 126/64   Heart Rate (Admit) 80 bpm   Heart Rate (Exercise) 109 bpm   Heart Rate (Exit) 85 bpm   Oxygen Saturation (Admit) 97 %   Oxygen Saturation (Exercise) 96 %   Rating of Perceived Exertion (Exercise) 13   Symptoms left hip pain 3/10      Nutrition:  Target Goals: Understanding of nutrition guidelines, daily intake of sodium 1500mg , cholesterol 200mg , calories 30% from fat and 7% or less from saturated fats, daily to have 5 or more servings of fruits and vegetables.  Biometrics:     Pre Biometrics - 02/11/17 1504      Pre Biometrics   Height 6' (1.829 m)   Weight 238 lb 1.6 oz (108 kg)   Waist Circumference 45.5 inches   Hip Circumference 45 inches   Waist to Hip Ratio 1.01 %   BMI (Calculated) 32.4       Nutrition Therapy Plan and Nutrition Goals:     Nutrition Therapy & Goals - 02/11/17 1526      Intervention Plan   Intervention Prescribe, educate and counsel regarding individualized specific dietary modifications aiming towards targeted core components such as weight, hypertension, lipid management, diabetes, heart failure and other comorbidities.   Expected Outcomes Short Term Goal: Understand basic principles of dietary content, such as calories, fat, sodium, cholesterol and nutrients.      Nutrition Discharge: Rate Your Plate Scores:     Nutrition Assessments - 02/11/17 1527      MEDFICTS Scores   Pre Score 66      Nutrition Goals Re-Evaluation:   Psychosocial: Target Goals: Acknowledge presence or absence of depression, maximize coping skills, provide positive support system.  Participant is able to verbalize types and ability to use techniques and skills needed for reducing stress and depression.  Initial Review & Psychosocial Screening:     Initial Psych Review & Screening - 02/11/17 1531      Initial Review   Current issues with Current Anxiety/Panic  PTSD from [place]? Yes   Concerns Recent loss of significant other   Comments lost his wife in August 2017     Barriers   Psychosocial barriers to participate in program The patient should benefit from training in stress management and relaxation.     Screening Interventions   Interventions Encouraged to exercise;Program counselor consult      Quality of Life Scores:     Quality of Life - 02/11/17 1534      Quality of Life Scores   Health/Function Pre 23.54 %   Socioeconomic Pre 24.58 %   Psych/Spiritual Pre 29.5 %   Family Pre 22.88 %   GLOBAL Pre 24.69 %      PHQ-9: Recent Review Flowsheet Data    Depression screen [facility] 2/9 02/11/2017 09/17/2016   Decreased Interest 2 0   Down, Depressed, Hopeless 1 1    PHQ - 2  Score 3 1   Altered sleeping 3 -   Tired, decreased energy 3 -   Change in appetite 2 -   Feeling bad or failure about yourself  0 -   Trouble concentrating 2 -   Moving slowly or fidgety/restless 1 -   Suicidal thoughts 0 -   PHQ-9 Score 14 -   Difficult doing work/chores Somewhat difficult -      Psychosocial Evaluation and Intervention:     Psychosocial Evaluation - 02/11/17 1534      Psychosocial Evaluation & Interventions   Interventions Stress management education;Relaxation education;Encouraged to exercise with the program and follow exercise prescription;Therapist referral      Psychosocial Re-Evaluation:   Vocational Rehabilitation: Provide vocational rehab assistance to qualifying candidates.   Vocational Rehab Evaluation & Intervention:     Vocational Rehab - 02/11/17 1537      Initial Vocational Rehab  Evaluation & Intervention   Assessment shows need for Vocational Rehabilitation No      Education: Education Goals: Education classes will be provided on a weekly basis, covering required topics. Participant will state understanding/return demonstration of topics presented.  Learning Barriers/Preferences:     Learning Barriers/Preferences - 02/11/17 1536      Learning Barriers/Preferences   Learning Barriers None   Learning Preferences Individual Instruction      Education Topics: General Nutrition Guidelines/Fats and Fiber: -Group instruction provided by verbal, written material, models and posters to present the general guidelines for heart healthy nutrition. Gives an explanation and review of dietary fats and fiber.   Controlling Sodium/Reading Food Labels: -Group verbal and written material supporting the discussion of sodium use in heart healthy nutrition. Review and explanation with models, verbal and written materials for utilization of the food label.   Exercise Physiology & Risk Factors: - Group verbal and written instruction with models to review the exercise physiology of the cardiovascular system and associated critical values. Details cardiovascular disease risk factors and the goals associated with each risk factor.   Aerobic Exercise & Resistance Training: - Gives group verbal and written discussion on the health impact of inactivity. On the components of aerobic and resistive training programs and the benefits of this training and how to safely progress through these programs.   Flexibility, Balance, General Exercise Guidelines: - Provides group verbal and written instruction on the benefits of flexibility and balance training programs. Provides general exercise guidelines with specific guidelines to those with heart or lung disease. Demonstration and skill practice provided.   Stress Management: - Provides group verbal and written instruction about the health  risks of elevated stress, cause of high stress, and healthy ways to reduce stress.   Depression: - Provides group verbal and written instruction on the correlation between heart/lung disease and depressed mood, treatment options, and the stigmas associated with seeking treatment.   Anatomy & Physiology of the Heart: - Group verbal and written instruction and models provide basic cardiac anatomy and physiology, with the coronary electrical and arterial systems. Review of: AMI, Angina, Valve disease, Heart Failure, Cardiac Arrhythmia, Pacemakers, and the ICD.   Cardiac Procedures: - Group verbal and written instruction and models to describe the testing methods done to diagnose heart disease. Reviews the outcomes of the test results. Describes the treatment choices: Medical Management, Angioplasty, or Coronary Bypass Surgery.   Cardiac Medications: - Group verbal and written instruction to review commonly prescribed medications for heart disease. Reviews the medication, class of the drug, and side effects. Includes the steps to  properly store meds and maintain the prescription regimen.   Go Sex-Intimacy & Heart Disease, Get SMART - Goal Setting: - Group verbal and written instruction through game format to discuss heart disease and the return to sexual intimacy. Provides group verbal and written material to discuss and apply goal setting through the application of the S.M.A.R.T. Method.   Other Matters of the Heart: - Provides group verbal, written materials and models to describe Heart Failure, Angina, Valve Disease, and Diabetes in the realm of heart disease. Includes description of the disease process and treatment options available to the cardiac patient.   Exercise & Equipment Safety: - Individual verbal instruction and demonstration of equipment use and safety with use of the equipment. Flowsheet Row Cardiac Rehab from 02/11/2017 in [facility] Cardiac and Pulmonary Rehab  Date  02/11/17   Educator  PS  Instruction Review Code  2- meets goals/outcomes      Infection Prevention: - Provides verbal and written material to individual with discussion of infection control including proper hand washing and proper equipment cleaning during exercise session. Flowsheet Row Cardiac Rehab from 02/11/2017 in [facility] Cardiac and Pulmonary Rehab  Date  02/11/17  Educator  PS  Instruction Review Code  2- meets goals/outcomes      Falls Prevention: - Provides verbal and written material to individual with discussion of falls prevention and safety. Flowsheet Row Cardiac Rehab from 02/11/2017 in [facility] Cardiac and Pulmonary Rehab  Date  02/11/17  Educator  PS  Instruction Review Code  2- meets goals/outcomes      Diabetes: - Individual verbal and written instruction to review signs/symptoms of diabetes, desired ranges of glucose level fasting, after meals and with exercise. Advice that pre and post exercise glucose checks will be done for 3 sessions at entry of program. [place] from 02/11/2017 in [facility] Cardiac and Pulmonary Rehab  Date  02/11/17  Educator  PS  Instruction Review Code  2- meets goals/outcomes       Knowledge Questionnaire Score:     Knowledge Questionnaire Score - 02/11/17 1536      Knowledge Questionnaire Score   Pre Score 15/28      Core Components/Risk Factors/Patient Goals at Admission:     Personal Goals and Risk Factors at Admission - 02/11/17 1512      Core Components/Risk Factors/Patient Goals on Admission    Weight Management Obesity;Weight Gain;Yes   Intervention Weight Management: Develop a combined nutrition and exercise program designed to reach desired caloric intake, while maintaining appropriate intake of nutrient and fiber, sodium and fats, and appropriate energy expenditure required for the weight goal.;Weight Management: Provide education and appropriate resources to help participant work on and attain dietary goals.;Weight  Management/Obesity: Establish reasonable short term and long term weight goals.;Obesity: Provide education and appropriate resources to help participant work on and attain dietary goals.   Admit Weight 238 lb 1.6 oz (108 kg)   Goal Weight: Short Term 233 lb (105.7 kg)   Goal Weight: Long Term 200 lb (90.7 kg)   Expected Outcomes Short Term: Continue to assess and modify interventions until short term weight is achieved;Long Term: Adherence to nutrition and physical activity/exercise program aimed toward attainment of established weight goal;Weight Loss: Understanding of general recommendations for a balanced deficit meal plan, which promotes 1-2 lb weight loss per week and includes a negative energy balance of [phone] kcal/d   Sedentary Yes   Intervention Provide advice, education, support and counseling about physical activity/exercise needs.;Develop an individualized exercise  prescription for aerobic and resistive training based on initial evaluation findings, risk stratification, comorbidities and participant's personal goals.   Expected Outcomes Achievement of increased cardiorespiratory fitness and enhanced flexibility, muscular endurance and strength shown through measurements of functional capacity and personal statement of participant.   Increase Strength and Stamina Yes   Intervention Provide advice, education, support and counseling about physical activity/exercise needs.;Develop an individualized exercise prescription for aerobic and resistive training based on initial evaluation findings, risk stratification, comorbidities and participant's personal goals.   Expected Outcomes Achievement of increased cardiorespiratory fitness and enhanced flexibility, muscular endurance and strength shown through measurements of functional capacity and personal statement of participant.   Improve shortness of breath with ADL's Yes   Intervention Provide education, individualized exercise plan and daily  activity instruction to help decrease symptoms of SOB with activities of daily living.   Expected Outcomes Short Term: Achieves a reduction of symptoms when performing activities of daily living.   Diabetes Yes   Intervention Provide education about signs/symptoms and action to take for hypo/hyperglycemia.;Provide education about proper nutrition, including hydration, and aerobic/resistive exercise prescription along with prescribed medications to achieve blood glucose in normal ranges: Fasting glucose 65-99 mg/dL   Expected Outcomes Short Term: Participant verbalizes understanding of the signs/symptoms and immediate care of hyper/hypoglycemia, proper foot care and importance of medication, aerobic/resistive exercise and nutrition plan for blood glucose control.;Long Term: Attainment of HbA1C < 7%.   Hypertension Yes   Intervention Provide education on lifestyle modifcations including regular physical activity/exercise, weight management, moderate sodium restriction and increased consumption of fresh fruit, vegetables, and low fat dairy, alcohol moderation, and smoking cessation.;Monitor prescription use compliance.   Expected Outcomes Short Term: Continued assessment and intervention until BP is < 140/80mm HG in hypertensive participants. < 130/58mm HG in hypertensive participants with diabetes, heart failure or chronic kidney disease.;Long Term: Maintenance of blood pressure at goal levels.   Lipids Yes   Intervention Provide education and support for participant on nutrition & aerobic/resistive exercise along with prescribed medications to achieve LDL 70mg , HDL >40mg .   Expected Outcomes Short Term: Participant states understanding of desired cholesterol values and is compliant with medications prescribed. Participant is following exercise prescription and nutrition guidelines.;Long Term: Cholesterol controlled with medications as prescribed, with individualized exercise RX and with personalized  nutrition plan. Value goals: LDL < 70mg , HDL > 40 mg.      Core Components/Risk Factors/Patient Goals Review:    Core Components/Risk Factors/Patient Goals at Discharge (Final Review):    ITP Comments:     ITP Comments    Row Name 02/11/17 1504           ITP Comments Medical Review completed;  initial ITP completed;  diagnosis documentation on [facility] encounter 11/05/16.          Comments:

## 2017-02-11 NOTE — Progress Notes (Deleted)
Pulmonary Individual Treatment Plan  Patient Details  Name: [name] MRN: [id number] Date of Birth: [birth year] Referring Provider:   Flowsheet Row Cardiac Rehab from 02/11/2017 in [name] Cardiac and Pulmonary Rehab  Referring Provider  Serafina Royals MD      Initial Encounter Date:  Flowsheet Row Cardiac Rehab from 02/11/2017 in [facility] Cardiac and Pulmonary Rehab  Date  02/11/17  Referring Provider  Serafina Royals MD      Visit Diagnosis: S/P CABG x 3  Patient's Home Medications on Admission:  Current Outpatient Prescriptions:  .  aspirin 81 MG chewable tablet, Chew 1 tablet (81 mg total) by mouth before cath procedure., Disp: 90 tablet, Rfl: 4 .  cholecalciferol (VITAMIN D) 1000 UNITS tablet, Take 1,000 Units by mouth daily., Disp: , Rfl:  .  finasteride (PROSCAR) 5 MG tablet, Take 5 mg by mouth daily., Disp: , Rfl:  .  insulin aspart (NOVOLOG) 100 UNIT/ML injection, Inject 20 Units into the skin 3 (three) times daily before meals., Disp: , Rfl:  .  insulin glargine (LANTUS) 100 UNIT/ML injection, Inject 75 Units into the skin at bedtime., Disp: , Rfl:  .  metFORMIN (GLUCOPHAGE) 850 MG tablet, Take 850 mg by mouth 3 (three) times daily., Disp: , Rfl:  .  nortriptyline (PAMELOR) 10 MG capsule, Take 30 mg by mouth at bedtime., Disp: , Rfl:  .  PARoxetine (PAXIL) 40 MG tablet, Take 40 mg by mouth every morning., Disp: , Rfl:  .  prazosin (MINIPRESS) 5 MG capsule, Take 5 mg by mouth 2 (two) times daily. , Disp: , Rfl:  .  rOPINIRole (REQUIP) 0.5 MG tablet, Take 0.5 mg by mouth at bedtime., Disp: , Rfl:  .  simvastatin (ZOCOR) 40 MG tablet, Take 40 mg by mouth at bedtime., Disp: , Rfl:  .  tamsulosin (FLOMAX) 0.4 MG CAPS capsule, Take 0.4 mg by mouth daily., Disp: , Rfl:  .  tiZANidine (ZANAFLEX) 2 MG tablet, Take 2 mg by mouth at bedtime., Disp: , Rfl:  .  vitamin B-12 (CYANOCOBALAMIN) 1000 MCG tablet, Take 1,000 mcg by mouth at bedtime., Disp: , Rfl:  .  lisinopril  (PRINIVIL,ZESTRIL) 20 MG tablet, Take 20 mg by mouth daily., Disp: , Rfl:  .  sucralfate (CARAFATE) 1 g tablet, Take 1 tablet (1 g total) by mouth 4 (four) times daily. (Patient not taking: Reported on 10/28/2016), Disp: 60 tablet, Rfl: 0 .  traMADol-acetaminophen (ULTRACET) 37.5-325 MG tablet, Take 1 tablet by mouth every 6 (six) hours as needed. , Disp: , Rfl:   Past Medical History: Past Medical History:  Diagnosis Date  . BPH (benign prostatic hyperplasia)   . Depression   . Diabetes mellitus without complication ([place])   . HLD (hyperlipidemia)   . Hypertension   . Neuropathy ([place])   . PTSD (post-traumatic stress disorder)   . Stroke [facility])     Tobacco Use: History  Smoking Status  . Never Smoker  Smokeless Tobacco  . Never Used    Labs: Recent Review Flowsheet Data    Labs for ITP Cardiac and Pulmonary Rehab Latest Ref Rng & Units 06/27/2016 06/28/2016   Cholestrol 0 - 200 mg/dL - 200   LDLCALC 0 - 99 mg/dL - UNABLE TO CALCULATE IF TRIGLYCERIDE OVER 400 mg/dL   HDL >40 mg/dL - 29(L)   Trlycerides <150 mg/dL - 515(H)   Hemoglobin A1c 4.0 - 6.0 % 13.3(H) -       ADL UCSD:   Pulmonary Function Assessment:  Exercise Target Goals: Date: 02/11/17  Exercise Program Goal: Individual exercise prescription set with THRR, safety & activity barriers. Participant demonstrates ability to understand and report RPE using BORG scale, to self-measure pulse accurately, and to acknowledge the importance of the exercise prescription.  Exercise Prescription Goal: Starting with aerobic activity 30 plus minutes a day, 3 days per week for initial exercise prescription. Provide home exercise prescription and guidelines that participant acknowledges understanding prior to discharge.  Activity Barriers & Risk Stratification:     Activity Barriers & Cardiac Risk Stratification - 02/11/17 1536      Activity Barriers & Cardiac Risk Stratification   Activity Barriers Other (comment)    Comments R knee "neuropathy" per patient report.  States he uses a cane as a "crutch".  Has struggled with R knee for approx 3 years.    Cardiac Risk Stratification High      6 Minute Walk:     6 Minute Walk    Row Name 02/11/17 1456         6 Minute Walk   Phase Initial     Distance 785 feet     Walk Time 5.83 minutes     # of Rest Breaks 1  10 sec     MPH 1.5     METS 1.75     RPE 13     VO2 Peak 6.12     Symptoms Yes (comment)     Comments complaint of left hip pain 3/10     Resting HR 80 bpm     Resting BP 122/70     Max Ex. HR 109 bpm     Max Ex. BP 126/64        Initial Exercise Prescription:     Initial Exercise Prescription - 02/11/17 1400      Date of Initial Exercise RX and Referring Provider   Date 02/11/17   Referring Provider Serafina Royals MD     Treadmill   MPH 1.5   Grade 0   Minutes 15   METs 1.7     NuStep   Level 1   Minutes 15   METs 1.5     Biostep-RELP   Level 1   Minutes 15   METs 1     Prescription Details   Frequency (times per week) 3   Duration Progress to 45 minutes of aerobic exercise without signs/symptoms of physical distress     Intensity   THRR 40-80% of Max Heartrate 107-135   Ratings of Perceived Exertion 11-13   Perceived Dyspnea 0-4     Progression   Progression Continue to progress workloads to maintain intensity without signs/symptoms of physical distress.     Resistance Training   Training Prescription Yes   Weight 3 lbs   Reps 10-15      Perform Capillary Blood Glucose checks as needed.  Exercise Prescription Changes:     Exercise Prescription Changes    Row Name 02/11/17 1400             Exercise Review   Progression -  walk test results         Response to Exercise   Blood Pressure (Admit) 122/70       Blood Pressure (Exercise) 126/64       Heart Rate (Admit) 80 bpm       Heart Rate (Exercise) 109 bpm       Heart Rate (Exit) 85 bpm       Oxygen Saturation (  Admit) 97 %        Oxygen Saturation (Exercise) 96 %       Rating of Perceived Exertion (Exercise) 13       Symptoms left hip pain 3/10          Exercise Comments:   Discharge Exercise Prescription (Final Exercise Prescription Changes):     Exercise Prescription Changes - 02/11/17 1400      Exercise Review   Progression --  walk test results     Response to Exercise   Blood Pressure (Admit) 122/70   Blood Pressure (Exercise) 126/64   Heart Rate (Admit) 80 bpm   Heart Rate (Exercise) 109 bpm   Heart Rate (Exit) 85 bpm   Oxygen Saturation (Admit) 97 %   Oxygen Saturation (Exercise) 96 %   Rating of Perceived Exertion (Exercise) 13   Symptoms left hip pain 3/10       Nutrition:  Target Goals: Understanding of nutrition guidelines, daily intake of sodium 1500mg , cholesterol 200mg , calories 30% from fat and 7% or less from saturated fats, daily to have 5 or more servings of fruits and vegetables.  Biometrics:     Pre Biometrics - 02/11/17 1504      Pre Biometrics   Height 6' (1.829 m)   Weight 238 lb 1.6 oz (108 kg)   Waist Circumference 45.5 inches   Hip Circumference 45 inches   Waist to Hip Ratio 1.01 %   BMI (Calculated) 32.4       Nutrition Therapy Plan and Nutrition Goals:     Nutrition Therapy & Goals - 02/11/17 1526      Intervention Plan   Intervention Prescribe, educate and counsel regarding individualized specific dietary modifications aiming towards targeted core components such as weight, hypertension, lipid management, diabetes, heart failure and other comorbidities.   Expected Outcomes Short Term Goal: Understand basic principles of dietary content, such as calories, fat, sodium, cholesterol and nutrients.      Nutrition Discharge: Rate Your Plate Scores:     Nutrition Assessments - 02/11/17 1527      MEDFICTS Scores   Pre Score 66      Psychosocial: Target Goals: Acknowledge presence or absence of depression, maximize coping skills, provide positive  support system. Participant is able to verbalize types and ability to use techniques and skills needed for reducing stress and depression.  Initial Review & Psychosocial Screening:     Initial Psych Review & Screening - 02/11/17 1531      Initial Review   Current issues with Current Anxiety/Panic  PTSD from [place]? Yes   Concerns Recent loss of significant other   Comments lost his wife in August 2017     Barriers   Psychosocial barriers to participate in program The patient should benefit from training in stress management and relaxation.     Screening Interventions   Interventions Encouraged to exercise;Program counselor consult      Quality of Life Scores:     Quality of Life - 02/11/17 1534      Quality of Life Scores   Health/Function Pre 23.54 %   Socioeconomic Pre 24.58 %   Psych/Spiritual Pre 29.5 %   Family Pre 22.88 %   GLOBAL Pre 24.69 %      PHQ-9: Recent Review Flowsheet Data    Depression screen [facility] 2/9 02/11/2017 09/17/2016   Decreased Interest 2 0   Down, Depressed, Hopeless 1 1  PHQ - 2 Score 3 1   Altered sleeping 3 -   Tired, decreased energy 3 -   Change in appetite 2 -   Feeling bad or failure about yourself  0 -   Trouble concentrating 2 -   Moving slowly or fidgety/restless 1 -   Suicidal thoughts 0 -   PHQ-9 Score 14 -   Difficult doing work/chores Somewhat difficult -      Psychosocial Evaluation and Intervention:     Psychosocial Evaluation - 02/11/17 1534      Psychosocial Evaluation & Interventions   Interventions Stress management education;Relaxation education;Encouraged to exercise with the program and follow exercise prescription;Therapist referral      Psychosocial Re-Evaluation:  Education: Education Goals: Education classes will be provided on a weekly basis, covering required topics. Participant will state understanding/return demonstration of topics presented.  Learning  Barriers/Preferences:     Learning Barriers/Preferences - 02/11/17 1536      Learning Barriers/Preferences   Learning Barriers None   Learning Preferences Individual Instruction      Education Topics: Initial Evaluation Education: - Verbal, written and demonstration of respiratory meds, RPE/PD scales, oximetry and breathing techniques. Instruction on use of nebulizers and MDIs: cleaning and proper use, rinsing mouth with steroid doses and importance of monitoring MDI activations.   General Nutrition Guidelines/Fats and Fiber: -Group instruction provided by verbal, written material, models and posters to present the general guidelines for heart healthy nutrition. Gives an explanation and review of dietary fats and fiber.   Controlling Sodium/Reading Food Labels: -Group verbal and written material supporting the discussion of sodium use in heart healthy nutrition. Review and explanation with models, verbal and written materials for utilization of the food label.   Exercise Physiology & Risk Factors: - Group verbal and written instruction with models to review the exercise physiology of the cardiovascular system and associated critical values. Details cardiovascular disease risk factors and the goals associated with each risk factor.   Aerobic Exercise & Resistance Training: - Gives group verbal and written discussion on the health impact of inactivity. On the components of aerobic and resistive training programs and the benefits of this training and how to safely progress through these programs.   Flexibility, Balance, General Exercise Guidelines: - Provides group verbal and written instruction on the benefits of flexibility and balance training programs. Provides general exercise guidelines with specific guidelines to those with heart or lung disease. Demonstration and skill practice provided.   Stress Management: - Provides group verbal and written instruction about the health  risks of elevated stress, cause of high stress, and healthy ways to reduce stress.   Depression: - Provides group verbal and written instruction on the correlation between heart/lung disease and depressed mood, treatment options, and the stigmas associated with seeking treatment.   Exercise & Equipment Safety: - Individual verbal instruction and demonstration of equipment use and safety with use of the equipment. Flowsheet Row Cardiac Rehab from 02/11/2017 in [facility] Cardiac and Pulmonary Rehab  Date  02/11/17  Educator  PS  Instruction Review Code  2- meets goals/outcomes      Infection Prevention: - Provides verbal and written material to individual with discussion of infection control including proper hand washing and proper equipment cleaning during exercise session. Flowsheet Row Cardiac Rehab from 02/11/2017 in [facility] Cardiac and Pulmonary Rehab  Date  02/11/17  Educator  PS  Instruction Review Code  2- meets goals/outcomes      Falls Prevention: - Provides verbal and written material  to individual with discussion of falls prevention and safety. Flowsheet Row Cardiac Rehab from 02/11/2017 in [facility] Cardiac and Pulmonary Rehab  Date  02/11/17  Educator  PS  Instruction Review Code  2- meets goals/outcomes      Diabetes: - Individual verbal and written instruction to review signs/symptoms of diabetes, desired ranges of glucose level fasting, after meals and with exercise. Advice that pre and post exercise glucose checks will be done for 3 sessions at entry of program. [place] from 02/11/2017 in [facility] Cardiac and Pulmonary Rehab  Date  02/11/17  Educator  PS  Instruction Review Code  2- meets goals/outcomes      Chronic Lung Diseases: - Group verbal and written instruction to review new updates, new respiratory medications, new advancements in procedures and treatments. Provide informative websites and "800" numbers of self-education.   Lung Procedures: -  Group verbal and written instruction to describe testing methods done to diagnose lung disease. Review the outcome of test results. Describe the treatment choices: Pulmonary Function Tests, ABGs and oximetry.   Energy Conservation: - Provide group verbal and written instruction for methods to conserve energy, plan and organize activities. Instruct on pacing techniques, use of adaptive equipment and posture/positioning to relieve shortness of breath.   Triggers: - Group verbal and written instruction to review types of environmental controls: home humidity, furnaces, filters, dust mite/pet prevention, HEPA vacuums. To discuss weather changes, air quality and the benefits of nasal washing.   Exacerbations: - Group verbal and written instruction to provide: warning signs, infection symptoms, calling MD promptly, preventive modes, and value of vaccinations. Review: effective airway clearance, coughing and/or vibration techniques. Create an Sports administrator.   Oxygen: - Individual and group verbal and written instruction on oxygen therapy. Includes supplement oxygen, available portable oxygen systems, continuous and intermittent flow rates, oxygen safety, concentrators, and Medicare reimbursement for oxygen.   Respiratory Medications: - Group verbal and written instruction to review medications for lung disease. Drug class, frequency, complications, importance of spacers, rinsing mouth after steroid MDI's, and proper cleaning methods for nebulizers.   AED/CPR: - Group verbal and written instruction with the use of models to demonstrate the basic use of the AED with the basic ABC's of resuscitation.   Breathing Retraining: - Provides individuals verbal and written instruction on purpose, frequency, and proper technique of diaphragmatic breathing and pursed-lipped breathing. Applies individual practice skills.   Anatomy and Physiology of the Lungs: - Group verbal and written instruction with the  use of models to provide basic lung anatomy and physiology related to function, structure and complications of lung disease.   Heart Failure: - Group verbal and written instruction on the basics of heart failure: signs/symptoms, treatments, explanation of ejection fraction, enlarged heart and cardiomyopathy.   Sleep Apnea: - Individual verbal and written instruction to review Obstructive Sleep Apnea. Review of risk factors, methods for diagnosing and types of masks and machines for OSA.   Anxiety: - Provides group, verbal and written instruction on the correlation between heart/lung disease and anxiety, treatment options, and management of anxiety.   Relaxation: - Provides group, verbal and written instruction about the benefits of relaxation for patients with heart/lung disease. Also provides patients with examples of relaxation techniques.   Knowledge Questionnaire Score:     Knowledge Questionnaire Score - 02/11/17 1536      Knowledge Questionnaire Score   Pre Score 15/28       Core Components/Risk Factors/Patient Goals at Admission:  Personal Goals and Risk Factors at Admission - 02/11/17 1512      Core Components/Risk Factors/Patient Goals on Admission    Weight Management Obesity;Weight Gain;Yes   Intervention Weight Management: Develop a combined nutrition and exercise program designed to reach desired caloric intake, while maintaining appropriate intake of nutrient and fiber, sodium and fats, and appropriate energy expenditure required for the weight goal.;Weight Management: Provide education and appropriate resources to help participant work on and attain dietary goals.;Weight Management/Obesity: Establish reasonable short term and long term weight goals.;Obesity: Provide education and appropriate resources to help participant work on and attain dietary goals.   Admit Weight 238 lb 1.6 oz (108 kg)   Goal Weight: Short Term 233 lb (105.7 kg)   Goal Weight: Long Term  200 lb (90.7 kg)   Expected Outcomes Short Term: Continue to assess and modify interventions until short term weight is achieved;Long Term: Adherence to nutrition and physical activity/exercise program aimed toward attainment of established weight goal;Weight Loss: Understanding of general recommendations for a balanced deficit meal plan, which promotes 1-2 lb weight loss per week and includes a negative energy balance of [phone] kcal/d   Sedentary Yes   Intervention Provide advice, education, support and counseling about physical activity/exercise needs.;Develop an individualized exercise prescription for aerobic and resistive training based on initial evaluation findings, risk stratification, comorbidities and participant's personal goals.   Expected Outcomes Achievement of increased cardiorespiratory fitness and enhanced flexibility, muscular endurance and strength shown through measurements of functional capacity and personal statement of participant.   Increase Strength and Stamina Yes   Intervention Provide advice, education, support and counseling about physical activity/exercise needs.;Develop an individualized exercise prescription for aerobic and resistive training based on initial evaluation findings, risk stratification, comorbidities and participant's personal goals.   Expected Outcomes Achievement of increased cardiorespiratory fitness and enhanced flexibility, muscular endurance and strength shown through measurements of functional capacity and personal statement of participant.   Improve shortness of breath with ADL's Yes   Intervention Provide education, individualized exercise plan and daily activity instruction to help decrease symptoms of SOB with activities of daily living.   Expected Outcomes Short Term: Achieves a reduction of symptoms when performing activities of daily living.   Diabetes Yes   Intervention Provide education about signs/symptoms and action to take for  hypo/hyperglycemia.;Provide education about proper nutrition, including hydration, and aerobic/resistive exercise prescription along with prescribed medications to achieve blood glucose in normal ranges: Fasting glucose 65-99 mg/dL   Expected Outcomes Short Term: Participant verbalizes understanding of the signs/symptoms and immediate care of hyper/hypoglycemia, proper foot care and importance of medication, aerobic/resistive exercise and nutrition plan for blood glucose control.;Long Term: Attainment of HbA1C < 7%.   Hypertension Yes   Intervention Provide education on lifestyle modifcations including regular physical activity/exercise, weight management, moderate sodium restriction and increased consumption of fresh fruit, vegetables, and low fat dairy, alcohol moderation, and smoking cessation.;Monitor prescription use compliance.   Expected Outcomes Short Term: Continued assessment and intervention until BP is < 140/66mm HG in hypertensive participants. < 130/39mm HG in hypertensive participants with diabetes, heart failure or chronic kidney disease.;Long Term: Maintenance of blood pressure at goal levels.   Lipids Yes   Intervention Provide education and support for participant on nutrition & aerobic/resistive exercise along with prescribed medications to achieve LDL 70mg , HDL >40mg .   Expected Outcomes Short Term: Participant states understanding of desired cholesterol values and is compliant with medications prescribed. Participant is following exercise prescription and nutrition guidelines.;Long Term: Cholesterol controlled  with medications as prescribed, with individualized exercise RX and with personalized nutrition plan. Value goals: LDL < 70mg , HDL > 40 mg.      Core Components/Risk Factors/Patient Goals Review:    Core Components/Risk Factors/Patient Goals at Discharge (Final Review):    ITP Comments:     ITP Comments    Row Name 02/11/17 1504           ITP Comments Medical  Review completed;  initial ITP completed;  diagnosis documentation on [facility] encounter 11/05/16.          Comments: medical review completed.

## 2017-02-17 ENCOUNTER — Encounter: Payer: Medicare Other | Admitting: *Deleted

## 2017-02-17 DIAGNOSIS — Z951 Presence of aortocoronary bypass graft: Secondary | ICD-10-CM

## 2017-02-17 LAB — GLUCOSE, CAPILLARY
Glucose-Capillary: 276 mg/dL — ABNORMAL HIGH (ref 65–99)
Glucose-Capillary: 239 mg/dL — ABNORMAL HIGH (ref 65–99)

## 2017-02-17 NOTE — Progress Notes (Signed)
Daily Session Note  Patient Details  Name: [name] MRN: [id number] Date of Birth: [birth year] Referring Provider:   Flowsheet Row Cardiac Rehab from 02/11/2017 in [facility] Cardiac and Pulmonary Rehab  Referring Provider  Serafina Royals MD      Encounter Date: 02/17/2017  Check In:     Session Check In - 02/17/17 0948      Check-In   Location [facility]-Cardiac & Pulmonary Rehab   Staff Present Alberteen Sam, MA, ACSM RCEP, Exercise Physiologist;Carroll Enterkin, RN, Vickki Hearing, BA, ACSM CEP, Exercise Physiologist   Supervising physician immediately available to respond to emergencies See telemetry face sheet for immediately available ER MD   Medication changes reported     No   Fall or balance concerns reported    No   Warm-up and Cool-down Performed on first and last piece of equipment   Resistance Training Performed No  Meeting with councelor   VAD Patient? No     Pain Assessment   Currently in Pain? No/denies   Multiple Pain Sites No         Goals Met:  Independence with exercise equipment Exercise tolerated well No report of cardiac concerns or symptoms Strength training completed today  Goals Unmet:  Not Applicable  Comments: First full day of exercise!  Patient was oriented to gym and equipment including functions, settings, policies, and procedures.  Patient's individual exercise prescription and treatment plan were reviewed.  All starting workloads were established based on the results of the 6 minute walk test done at initial orientation visit.  The plan for exercise progression was also introduced and progression will be customized based on patient's performance and goals.    Dr. Emily Filbert is Medical Director for [place] and [facility].

## 2017-02-19 DIAGNOSIS — Z951 Presence of aortocoronary bypass graft: Secondary | ICD-10-CM

## 2017-02-19 LAB — GLUCOSE, CAPILLARY
Glucose-Capillary: 219 mg/dL — ABNORMAL HIGH (ref 65–99)
Glucose-Capillary: 221 mg/dL — ABNORMAL HIGH (ref 65–99)

## 2017-02-19 NOTE — Progress Notes (Signed)
Daily Session Note  Patient Details  Name: [name] MRN: [id number] Date of Birth: January 06, 1946 Referring Provider:   Flowsheet Row Cardiac Rehab from 02/11/2017 in [facility]  Referring Provider  Serafina Royals MD      Encounter Date: 02/19/2017  Check In:     Session Check In - 02/19/17 0815      Check-In   Location [facility]-Cardiac & Pul[facility] Re[facility]   Staff Present Alberteen Sam, MA, ACSM RCEP, Exercise Physiologist;Susanne Bice, RN, BSN, Lance Sell, BA, ACSM CEP, Exercise Physiologist   Supervising physician immediately available to respond to emergencies See telemetry face sheet for immediately available ER MD   Medication changes reported     No   Fall or balance concerns reported    No   Warm-up and Cool-down Performed on first and last piece of equipment   Resistance Training Performed Yes   VAD Patient? No     Pain Assessment   Currently in Pain? No/denies   Multiple Pain Sites No         Goals Met:  Independence with exercise equipment Exercise tolerated well No report of cardiac concerns or symptoms Strength training completed today  Goals Unmet:  Not Applicable  Comments: Pt able to follow exercise prescription today without complaint.  Will continue to monitor for progression.    Dr. Emily Filbert is Medical Director for [place] and [facility].

## 2017-02-22 ENCOUNTER — Encounter: Payer: Medicare Other | Admitting: *Deleted

## 2017-02-22 DIAGNOSIS — Z951 Presence of aortocoronary bypass graft: Secondary | ICD-10-CM | POA: Diagnosis not present

## 2017-02-22 LAB — GLUCOSE, CAPILLARY: Glucose-Capillary: 268 mg/dL — ABNORMAL HIGH (ref 65–99)

## 2017-02-22 NOTE — Progress Notes (Signed)
Daily Session Note  Patient Details  Name: [name] MRN: [id number] Date of Birth: 31-Mar-1946 Referring Provider:   Flowsheet Row Cardiac Rehab from 02/11/2017 in [facility]  Referring Provider  Serafina Royals MD      Encounter Date: 02/22/2017  Check In:     Session Check In - 02/22/17 0805      Check-In   Location [facility]   Staff Present Alberteen Sam, MA, ACSM RCEP, Exercise Physiologist;Carroll Enterkin, RN, Moises Blood, BS, ACSM CEP, Exercise Physiologist   Supervising physician immediately available to respond to emergencies See telemetry face sheet for immediately available ER MD   Medication changes reported     No   Fall or balance concerns reported    No   Warm-up and Cool-down Performed on first and last piece of equipment   Resistance Training Performed Yes   VAD Patient? No     Pain Assessment   Currently in Pain? No/denies   Multiple Pain Sites No         History  Smoking Status  . Never Smoker  Smokeless Tobacco  . Never Used    Goals Met:  Independence with exercise equipment Exercise tolerated well No report of cardiac concerns or symptoms Strength training completed today  Goals Unmet:  Not Applicable  Comments: Pt able to follow exercise prescription today without complaint.  Will continue to monitor for progression.    Dr. Emily Filbert is Medical Director for [place] and [facility].

## 2017-02-24 ENCOUNTER — Encounter: Payer: Medicare Other | Admitting: *Deleted

## 2017-02-24 ENCOUNTER — Encounter: Payer: Self-pay | Admitting: *Deleted

## 2017-02-24 DIAGNOSIS — Z951 Presence of aortocoronary bypass graft: Secondary | ICD-10-CM

## 2017-02-24 NOTE — Progress Notes (Signed)
Daily Session Note  Patient Details  Name: [name] MRN: [id number] Date of Birth: 06-17-1946 Referring Provider:   Flowsheet Row Cardiac Rehab from 02/11/2017 in [facility]  Referring Provider  Serafina Royals MD      Encounter Date: 02/24/2017  Check In:     Session Check In - 02/24/17 0849      Check-In   Location [facility]   Staff Present Alberteen Sam, MA, ACSM RCEP, Exercise Physiologist;Susanne Bice, RN, BSN, Lance Sell, BA, ACSM CEP, Exercise Physiologist   Supervising physician immediately available to respond to emergencies See telemetry face sheet for immediately available ER MD   Medication changes reported     No   Fall or balance concerns reported    No   Warm-up and Cool-down Performed on first and last piece of equipment   Resistance Training Performed Yes   VAD Patient? No     Pain Assessment   Currently in Pain? No/denies   Multiple Pain Sites No         History  Smoking Status  . Never Smoker  Smokeless Tobacco  . Never Used    Goals Met:  Independence with exercise equipment Exercise tolerated well No report of cardiac concerns or symptoms Strength training completed today  Goals Unmet:  Not Applicable  Comments: Pt able to follow exercise prescription today without complaint.  Will continue to monitor for progression. Reviewed home exercise with pt today.  Pt plans to walk at home for exercise.  Reviewed THR, pulse, RPE, sign and symptoms, and when to call 911 or MD.  Also discussed weather considerations and indoor options.  Pt voiced understanding.    Dr. Emily Filbert is Medical Director for [place] and [facility].

## 2017-02-24 NOTE — Progress Notes (Signed)
Cardiac Individual Treatment Plan  Patient Details  Name: [name] MRN: [id number] Date of Birth: 1946/06/17 Referring Provider:   Flowsheet Row Cardiac Rehab from 02/11/2017 in [facility] Cardiac and Pulmonary Rehab  Referring Provider  Serafina Royals MD      Initial Encounter Date:  Flowsheet Row Cardiac Rehab from 02/11/2017 in [facility] Cardiac and Pulmonary Rehab  Date  02/11/17  Referring Provider  Serafina Royals MD      Visit Diagnosis: S/P CABG x 3  Patient's Home Medications on Admission:  Current Outpatient Prescriptions:  .  aspirin 81 MG chewable tablet, Chew 1 tablet (81 mg total) by mouth before cath procedure., Disp: 90 tablet, Rfl: 4 .  cholecalciferol (VITAMIN D) 1000 UNITS tablet, Take 1,000 Units by mouth daily., Disp: , Rfl:  .  finasteride (PROSCAR) 5 MG tablet, Take 5 mg by mouth daily., Disp: , Rfl:  .  insulin aspart (NOVOLOG) 100 UNIT/ML injection, Inject 20 Units into the skin 3 (three) times daily before meals., Disp: , Rfl:  .  insulin glargine (LANTUS) 100 UNIT/ML injection, Inject 75 Units into the skin at bedtime., Disp: , Rfl:  .  lisinopril (PRINIVIL,ZESTRIL) 20 MG tablet, Take 20 mg by mouth daily., Disp: , Rfl:  .  metFORMIN (GLUCOPHAGE) 850 MG tablet, Take 850 mg by mouth 3 (three) times daily., Disp: , Rfl:  .  nortriptyline (PAMELOR) 10 MG capsule, Take 30 mg by mouth at bedtime., Disp: , Rfl:  .  PARoxetine (PAXIL) 40 MG tablet, Take 40 mg by mouth every morning., Disp: , Rfl:  .  prazosin (MINIPRESS) 5 MG capsule, Take 5 mg by mouth 2 (two) times daily. , Disp: , Rfl:  .  rOPINIRole (REQUIP) 0.5 MG tablet, Take 0.5 mg by mouth at bedtime., Disp: , Rfl:  .  simvastatin (ZOCOR) 40 MG tablet, Take 40 mg by mouth at bedtime., Disp: , Rfl:  .  sucralfate (CARAFATE) 1 g tablet, Take 1 tablet (1 g total) by mouth 4 (four) times daily. (Patient not taking: Reported on 10/28/2016), Disp: 60 tablet, Rfl: 0 .  tamsulosin (FLOMAX) 0.4 MG CAPS capsule,  Take 0.4 mg by mouth daily., Disp: , Rfl:  .  tiZANidine (ZANAFLEX) 2 MG tablet, Take 2 mg by mouth at bedtime., Disp: , Rfl:  .  traMADol-acetaminophen (ULTRACET) 37.5-325 MG tablet, Take 1 tablet by mouth every 6 (six) hours as needed. , Disp: , Rfl:  .  vitamin B-12 (CYANOCOBALAMIN) 1000 MCG tablet, Take 1,000 mcg by mouth at bedtime., Disp: , Rfl:   Past Medical History: Past Medical History:  Diagnosis Date  . BPH (benign prostatic hyperplasia)   . Depression   . Diabetes mellitus without complication ([place])   . HLD (hyperlipidemia)   . Hypertension   . Neuropathy ([place])   . PTSD (post-traumatic stress disorder)   . Stroke [facility])     Tobacco Use: History  Smoking Status  . Never Smoker  Smokeless Tobacco  . Never Used    Labs: Recent Review Flowsheet Data    Labs for ITP Cardiac and Pulmonary Rehab Latest Ref Rng & Units 06/27/2016 06/28/2016   Cholestrol 0 - 200 mg/dL - 200   LDLCALC 0 - 99 mg/dL - UNABLE TO CALCULATE IF TRIGLYCERIDE OVER 400 mg/dL   HDL >40 mg/dL - 29(L)   Trlycerides <150 mg/dL - 515(H)   Hemoglobin A1c 4.0 - 6.0 % 13.3(H) -       Exercise Target Goals:    Exercise Program Goal:  Individual exercise prescription set with THRR, safety & activity barriers. Participant demonstrates ability to understand and report RPE using BORG scale, to self-measure pulse accurately, and to acknowledge the importance of the exercise prescription.  Exercise Prescription Goal: Starting with aerobic activity 30 plus minutes a day, 3 days per week for initial exercise prescription. Provide home exercise prescription and guidelines that participant acknowledges understanding prior to discharge.  Activity Barriers & Risk Stratification:     Activity Barriers & Cardiac Risk Stratification - 02/11/17 1536      Activity Barriers & Cardiac Risk Stratification   Activity Barriers Other (comment)   Comments R knee "neuropathy" per patient report.  States he uses a cane as a  "crutch".  Has struggled with R knee for approx 3 years.    Cardiac Risk Stratification High      6 Minute Walk:     6 Minute Walk    Row Name 02/11/17 1456         6 Minute Walk   Phase Initial     Distance 785 feet     Walk Time 5.83 minutes     # of Rest Breaks 1  10 sec     MPH 1.5     METS 1.75     RPE 13     VO2 Peak 6.12     Symptoms Yes (comment)     Comments complaint of left hip pain 3/10     Resting HR 80 bpm     Resting BP 122/70     Max Ex. HR 109 bpm     Max Ex. BP 126/64        Oxygen Initial Assessment:   Oxygen Re-Evaluation:   Oxygen Discharge (Final Oxygen Re-Evaluation):   Initial Exercise Prescription:     Initial Exercise Prescription - 02/11/17 1400      Date of Initial Exercise RX and Referring Provider   Date 02/11/17   Referring Provider Serafina Royals MD     Treadmill   MPH 1.5   Grade 0   Minutes 15   METs 1.7     NuStep   Level 1   Minutes 15   METs 1.5     Biostep-RELP   Level 1   Minutes 15   METs 1     Prescription Details   Frequency (times per week) 3   Duration Progress to 45 minutes of aerobic exercise without signs/symptoms of physical distress     Intensity   THRR 40-80% of Max Heartrate 107-135   Ratings of Perceived Exertion 11-13   Perceived Dyspnea 0-4     Progression   Progression Continue to progress workloads to maintain intensity without signs/symptoms of physical distress.     Resistance Training   Training Prescription Yes   Weight 3 lbs   Reps 10-15      Perform Capillary Blood Glucose checks as needed.  Exercise Prescription Changes:     Exercise Prescription Changes    Row Name 02/11/17 1400             Response to Exercise   Blood Pressure (Admit) 122/70       Blood Pressure (Exercise) 126/64       Heart Rate (Admit) 80 bpm       Heart Rate (Exercise) 109 bpm       Heart Rate (Exit) 85 bpm       Oxygen Saturation (Admit) 97 %       Oxygen Saturation (  Exercise)  96 %       Rating of Perceived Exertion (Exercise) 13       Symptoms left hip pain 3/10         Exercise Review   Progression -  walk test results          Exercise Comments:     Exercise Comments    Row Name 02/17/17 0950           Exercise Comments First full day of exercise!  Patient was oriented to gym and equipment including functions, settings, policies, and procedures.  Patient's individual exercise prescription and treatment plan were reviewed.  All starting workloads were established based on the results of the 6 minute walk test done at initial orientation visit.  The plan for exercise progression was also introduced and progression will be customized based on patient's performance and goals.          Exercise Goals and Review:   Exercise Goals Re-Evaluation :   Discharge Exercise Prescription (Final Exercise Prescription Changes):     Exercise Prescription Changes - 02/11/17 1400      Response to Exercise   Blood Pressure (Admit) 122/70   Blood Pressure (Exercise) 126/64   Heart Rate (Admit) 80 bpm   Heart Rate (Exercise) 109 bpm   Heart Rate (Exit) 85 bpm   Oxygen Saturation (Admit) 97 %   Oxygen Saturation (Exercise) 96 %   Rating of Perceived Exertion (Exercise) 13   Symptoms left hip pain 3/10     Exercise Review   Progression --  walk test results      Nutrition:  Target Goals: Understanding of nutrition guidelines, daily intake of sodium <1565m, cholesterol <209m calories 30% from fat and 7% or less from saturated fats, daily to have 5 or more servings of fruits and vegetables.  Biometrics:     Pre Biometrics - 02/11/17 1504      Pre Biometrics   Height 6' (1.829 m)   Weight 238 lb 1.6 oz (108 kg)   Waist Circumference 45.5 inches   Hip Circumference 45 inches   Waist to Hip Ratio 1.01 %   BMI (Calculated) 32.4       Nutrition Therapy Plan and Nutrition Goals:     Nutrition Therapy & Goals - 02/11/17 1526      Intervention  Plan   Intervention Prescribe, educate and counsel regarding individualized specific dietary modifications aiming towards targeted core components such as weight, hypertension, lipid management, diabetes, heart failure and other comorbidities.   Expected Outcomes Short Term Goal: Understand basic principles of dietary content, such as calories, fat, sodium, cholesterol and nutrients.      Nutrition Discharge: Rate Your Plate Scores:     Nutrition Assessments - 02/11/17 1527      MEDFICTS Scores   Pre Score 66      Nutrition Goals Re-Evaluation:   Nutrition Goals Discharge (Final Nutrition Goals Re-Evaluation):   Psychosocial: Target Goals: Acknowledge presence or absence of significant depression and/or stress, maximize coping skills, provide positive support system. Participant is able to verbalize types and ability to use techniques and skills needed for reducing stress and depression.   Initial Review & Psychosocial Screening:     Initial Psych Review & Screening - 02/11/17 1531      Initial Review   Current issues with Current Anxiety/Panic  [email or web address]   Concerns Recent loss of significant other  Comments lost his wife in August 2017     Barriers   Psychosocial barriers to participate in program The patient should benefit from training in stress management and relaxation.     Screening Interventions   Interventions Encouraged to exercise;Program counselor consult      Quality of Life Scores:      Quality of Life - 02/11/17 1534      Quality of Life Scores   Health/Function Pre 23.54 %   Socioeconomic Pre 24.58 %   Psych/Spiritual Pre 29.5 %   Family Pre 22.88 %   GLOBAL Pre 24.69 %      PHQ-9: Recent Review Flowsheet Data    Depression screen [facility] 2/9 02/11/2017 09/17/2016   Decreased Interest 2 0   Down, Depressed, Hopeless 1 1    PHQ - 2 Score 3 1   Altered sleeping 3 -   Tired, decreased energy  3 -   Change in appetite 2 -   Feeling bad or failure about yourself  0 -   Trouble concentrating 2 -   Moving slowly or fidgety/restless 1 -   Suicidal thoughts 0 -   PHQ-9 Score 14 -   Difficult doing work/chores Somewhat difficult -     Interpretation of Total Score  Total Score Depression Severity:  1-4 = Minimal depression, 5-9 = Mild depression, 10-14 = Moderate depression, 15-19 = Moderately severe depression, 20-27 = Severe depression   Psychosocial Evaluation and Intervention:     Psychosocial Evaluation - 02/17/17 1133      Psychosocial Evaluation & Interventions   Comments Counselor met with Mr. Fuston [name]) today for initial psychosocial evaluation.  He is a 71 year old who had triple bypass surgery this past Fall.  He lives alone subsequent to his spouse passing away 7 months ago after 38 years of marriage.  He has a son and daughter in law who live close by and several neighbors who are part of his support system.  [name] reports sleeping well and having a "decent" appetite although with having a stroke last year, as well as being a diabetic - hard to find healthy options to eat.  [name] reports a history of anxiety and a diagnosis of PTSD since the 1990's.  He has been on medications for this and states they work well; although he has noticed more sadness since the loss of his spouse understandably.  [name] reports his health and recovering from the loss of his spouse are his primary stressors; as well as doing his annual tax preparation.  He has goals to get his energy back and lose some weight.  counselor recommended speaking with the dietician as well as participating in the nutrition education to address his weight loss and appetite concerns.  Counselor and staff will be following with [name] throughout the course of this program.     Continue Psychosocial Services  Yes      Psychosocial Re-Evaluation:   Psychosocial Discharge (Final Psychosocial  Re-Evaluation):   Vocational Rehabilitation: Provide vocational rehab assistance to qualifying candidates.   Vocational Rehab Evaluation & Intervention:     Vocational Rehab - 02/11/17 1537      Initial Vocational Rehab Evaluation & Intervention   Assessment shows need for Vocational Rehabilitation No      Education: Education Goals: Education classes will be provided on a weekly basis, covering required topics. Participant will state understanding/return demonstration of topics presented.  Learning Barriers/Preferences:     Learning Barriers/Preferences - 02/11/17 1536  Learning Barriers/Preferences   Learning Barriers None   Learning Preferences Individual Instruction      Education Topics: General Nutrition Guidelines/Fats and Fiber: -Group instruction provided by verbal, written material, models and posters to present the general guidelines for heart healthy nutrition. Gives an explanation and review of dietary fats and fiber.   Controlling Sodium/Reading Food Labels: -Group verbal and written material supporting the discussion of sodium use in heart healthy nutrition. Review and explanation with models, verbal and written materials for utilization of the food label.   Exercise Physiology & Risk Factors: - Group verbal and written instruction with models to review the exercise physiology of the cardiovascular system and associated critical values. Details cardiovascular disease risk factors and the goals associated with each risk factor.   Aerobic Exercise & Resistance Training: - Gives group verbal and written discussion on the health impact of inactivity. On the components of aerobic and resistive training programs and the benefits of this training and how to safely progress through these programs.   Flexibility, Balance, General Exercise Guidelines: - Provides group verbal and written instruction on the benefits of flexibility and balance training programs.  Provides general exercise guidelines with specific guidelines to those with heart or lung disease. Demonstration and skill practice provided.   Stress Management: - Provides group verbal and written instruction about the health risks of elevated stress, cause of high stress, and healthy ways to reduce stress.   Depression: - Provides group verbal and written instruction on the correlation between heart/lung disease and depressed mood, treatment options, and the stigmas associated with seeking treatment.   Anatomy & Physiology of the Heart: - Group verbal and written instruction and models provide basic cardiac anatomy and physiology, with the coronary electrical and arterial systems. Review of: AMI, Angina, Valve disease, Heart Failure, Cardiac Arrhythmia, Pacemakers, and the ICD.   Cardiac Procedures: - Group verbal and written instruction and models to describe the testing methods done to diagnose heart disease. Reviews the outcomes of the test results. Describes the treatment choices: Medical Management, Angioplasty, or Coronary Bypass Surgery.   Cardiac Medications: - Group verbal and written instruction to review commonly prescribed medications for heart disease. Reviews the medication, class of the drug, and side effects. Includes the steps to properly store meds and maintain the prescription regimen. Flowsheet Row Cardiac Rehab from 02/22/2017 in [facility] Cardiac and Pulmonary Rehab  Date  02/17/17 [Second Part]  Educator  CE  Instruction Review Code  2- meets goals/outcomes      Go Sex-Intimacy & Heart Disease, Get SMART - Goal Setting: - Group verbal and written instruction through game format to discuss heart disease and the return to sexual intimacy. Provides group verbal and written material to discuss and apply goal setting through the application of the S.M.A.R.T. Method.   Other Matters of the Heart: - Provides group verbal, written materials and models to describe Heart  Failure, Angina, Valve Disease, and Diabetes in the realm of heart disease. Includes description of the disease process and treatment options available to the cardiac patient.   Exercise & Equipment Safety: - Individual verbal instruction and demonstration of equipment use and safety with use of the equipment. Flowsheet Row Cardiac Rehab from 02/22/2017 in [facility] Cardiac and Pulmonary Rehab  Date  02/11/17  Educator  PS  Instruction Review Code  2- meets goals/outcomes      Infection Prevention: - Provides verbal and written material to individual with discussion of infection control including proper hand washing and proper  equipment cleaning during exercise session. Flowsheet Row Cardiac Rehab from 02/22/2017 in [facility] Cardiac and Pulmonary Rehab  Date  02/11/17  Educator  PS  Instruction Review Code  2- meets goals/outcomes      Falls Prevention: - Provides verbal and written material to individual with discussion of falls prevention and safety. Flowsheet Row Cardiac Rehab from 02/22/2017 in [facility] Cardiac and Pulmonary Rehab  Date  02/11/17  Educator  PS  Instruction Review Code  2- meets goals/outcomes      Diabetes: - Individual verbal and written instruction to review signs/symptoms of diabetes, desired ranges of glucose level fasting, after meals and with exercise. Advice that pre and post exercise glucose checks will be done for 3 sessions at entry of program. [place] [facility] from 02/22/2017 in [facility] Cardiac and Pulmonary Rehab  Date  02/11/17  Educator  PS  Instruction Review Code  2- meets goals/outcomes       Knowledge Questionnaire Score:     Knowledge Questionnaire Score - 02/11/17 1536      Knowledge Questionnaire Score   Pre Score 15/28      Core Components/Risk Factors/Patient Goals at Admission:     Personal Goals and Risk Factors at Admission - 02/11/17 1512      Core Components/Risk Factors/Patient Goals on Admission    Weight Management  Obesity;Weight Gain;Yes   Intervention Weight Management: Develop a combined nutrition and exercise program designed to reach desired caloric intake, while maintaining appropriate intake of nutrient and fiber, sodium and fats, and appropriate energy expenditure required for the weight goal.;Weight Management: Provide education and appropriate resources to help participant work on and attain dietary goals.;Weight Management/Obesity: Establish reasonable short term and long term weight goals.;Obesity: Provide education and appropriate resources to help participant work on and attain dietary goals.   Admit Weight 238 lb 1.6 oz (108 kg)   Goal Weight: Short Term 233 lb (105.7 kg)   Goal Weight: Long Term 200 lb (90.7 kg)   Expected Outcomes Short Term: Continue to assess and modify interventions until short term weight is achieved;Long Term: Adherence to nutrition and physical activity/exercise program aimed toward attainment of established weight goal;Weight Loss: Understanding of general recommendations for a balanced deficit meal plan, which promotes 1-2 lb weight loss per week and includes a negative energy balance of [phone] kcal/d   Sedentary Yes   Intervention Provide advice, education, support and counseling about physical activity/exercise needs.;Develop an individualized exercise prescription for aerobic and resistive training based on initial evaluation findings, risk stratification, comorbidities and participant's personal goals.   Expected Outcomes Achievement of increased cardiorespiratory fitness and enhanced flexibility, muscular endurance and strength shown through measurements of functional capacity and personal statement of participant.   Increase Strength and Stamina Yes   Intervention Provide advice, education, support and counseling about physical activity/exercise needs.;Develop an individualized exercise prescription for aerobic and resistive training based on initial evaluation  findings, risk stratification, comorbidities and participant's personal goals.   Expected Outcomes Achievement of increased cardiorespiratory fitness and enhanced flexibility, muscular endurance and strength shown through measurements of functional capacity and personal statement of participant.   Improve shortness of breath with ADL's Yes   Intervention Provide education, individualized exercise plan and daily activity instruction to help decrease symptoms of SOB with activities of daily living.   Expected Outcomes Short Term: Achieves a reduction of symptoms when performing activities of daily living.   Diabetes Yes   Intervention Provide education about signs/symptoms and action to take for  hypo/hyperglycemia.;Provide education about proper nutrition, including hydration, and aerobic/resistive exercise prescription along with prescribed medications to achieve blood glucose in normal ranges: Fasting glucose 65-99 mg/dL   Expected Outcomes Short Term: Participant verbalizes understanding of the signs/symptoms and immediate care of hyper/hypoglycemia, proper foot care and importance of medication, aerobic/resistive exercise and nutrition plan for blood glucose control.;Long Term: Attainment of HbA1C < 7%.   Hypertension Yes   Intervention Provide education on lifestyle modifcations including regular physical activity/exercise, weight management, moderate sodium restriction and increased consumption of fresh fruit, vegetables, and low fat dairy, alcohol moderation, and smoking cessation.;Monitor prescription use compliance.   Expected Outcomes Short Term: Continued assessment and intervention until BP is < 140/42m HG in hypertensive participants. < 130/85mHG in hypertensive participants with diabetes, heart failure or chronic kidney disease.;Long Term: Maintenance of blood pressure at goal levels.   Lipids Yes   Intervention Provide education and support for participant on nutrition & aerobic/resistive  exercise along with prescribed medications to achieve LDL <7012mHDL >44m39m Expected Outcomes Short Term: Participant states understanding of desired cholesterol values and is compliant with medications prescribed. Participant is following exercise prescription and nutrition guidelines.;Long Term: Cholesterol controlled with medications as prescribed, with individualized exercise RX and with personalized nutrition plan. Value goals: LDL < 70mg73mL > 40 mg.      Core Components/Risk Factors/Patient Goals Review:    Core Components/Risk Factors/Patient Goals at Discharge (Final Review):    ITP Comments:     ITP Comments    Row Name 02/11/17 1504 02/24/17 0548         ITP Comments Medical Review completed;  initial ITP completed;  [facility] 30 day [facility]. Continue with ITP unless directed changes per Medical Director review         Comments:

## 2017-02-26 ENCOUNTER — Encounter: Payer: Medicare Other | Attending: Internal Medicine

## 2017-02-26 ENCOUNTER — Telehealth: Payer: Self-pay | Admitting: *Deleted

## 2017-02-26 DIAGNOSIS — Z951 Presence of aortocoronary bypass graft: Secondary | ICD-10-CM | POA: Insufficient documentation

## 2017-02-26 NOTE — Telephone Encounter (Signed)
[name] called this morning to let us know that he would not be here today because he had not been feeling well. He hopes to return on Monday.

## 2017-03-01 ENCOUNTER — Encounter: Payer: Medicare Other | Admitting: *Deleted

## 2017-03-01 DIAGNOSIS — Z951 Presence of aortocoronary bypass graft: Secondary | ICD-10-CM

## 2017-03-01 NOTE — Progress Notes (Signed)
Daily Session Note  Patient Details  Name: [name] MRN: [id number] Date of Birth: 1946-10-29 Referring Provider:   Flowsheet Row Cardiac Rehab from 02/11/2017 in [facility] Cardiac and Pulmonary Rehab  Referring Provider  Serafina Royals MD      Encounter Date: 03/01/2017  Check In:     Session Check In - 03/01/17 0855      Check-In   Location [facility]-Cardiac & Pulmonary Rehab   Staff Present Earlean Shawl, BS, ACSM CEP, Exercise Physiologist;Other;Jessica Luan Pulling, MA, ACSM RCEP, Exercise Physiologist   Supervising physician immediately available to respond to emergencies See telemetry face sheet for immediately available ER MD   Medication changes reported     No   Fall or balance concerns reported    No   Warm-up and Cool-down Performed on first and last piece of equipment   Resistance Training Performed Yes   VAD Patient? No     Pain Assessment   Currently in Pain? No/denies   Multiple Pain Sites No         History  Smoking Status  . Never Smoker  Smokeless Tobacco  . Never Used    Goals Met:  Independence with exercise equipment Exercise tolerated well No report of cardiac concerns or symptoms Strength training completed today  Goals Unmet:  Not Applicable  Comments: Pt able to follow exercise prescription today without complaint.  Will continue to monitor for progression.    Dr. Emily Filbert is Medical Director for [place] and [facility].

## 2017-03-03 DIAGNOSIS — Z951 Presence of aortocoronary bypass graft: Secondary | ICD-10-CM

## 2017-03-03 NOTE — Progress Notes (Signed)
Daily Session Note  Patient Details  Name: [name] MRN: [id number] Date of Birth: [birth year] Referring Provider:   Flowsheet Row Cardiac Rehab from 02/11/2017 in [facility] Cardiac and Pulmonary Rehab  Referring Provider  Serafina Royals MD      Encounter Date: 03/03/2017  Check In:     Session Check In - 03/03/17 0842      Check-In   Location [facility]-Cardiac & Pulmonary Rehab   Staff Present Alberteen Sam, MA, ACSM RCEP, Exercise Physiologist;Susanne Bice, RN, BSN, Lance Sell, BA, ACSM CEP, Exercise Physiologist   Supervising physician immediately available to respond to emergencies See telemetry face sheet for immediately available ER MD   Medication changes reported     No   Fall or balance concerns reported    No   Warm-up and Cool-down Performed on first and last piece of equipment   Resistance Training Performed Yes   VAD Patient? No     Pain Assessment   Currently in Pain? No/denies   Multiple Pain Sites No           Exercise Prescription Changes - 03/02/17 1500      Response to Exercise   Blood Pressure (Admit) 142/64   Blood Pressure (Exercise) 138/50   Blood Pressure (Exit) 126/74   Heart Rate (Admit) 89 bpm   Heart Rate (Exercise) 90 bpm   Heart Rate (Exit) 85 bpm   Rating of Perceived Exertion (Exercise) 11   Symptoms none   Duration Continue with 45 min of aerobic exercise without signs/symptoms of physical distress.   Intensity THRR unchanged     Progression   Progression Continue to progress workloads to maintain intensity without signs/symptoms of physical distress.   Average METs 2.34     Resistance Training   Training Prescription Yes   Weight 3 lbs   Reps 10-15     Treadmill   MPH 1.4   Grade 0   Minutes 15   METs 2.07     NuStep   Level 2   Minutes 15   METs 2.6     Home Exercise Plan   Plans to continue exercise at Home (comment)  walking   Frequency Add 2 additional days to program exercise sessions.   Initial  Home Exercises Provided 02/24/17      History  Smoking Status  . Never Smoker  Smokeless Tobacco  . Never Used    Goals Met:  Independence with exercise equipment Exercise tolerated well No report of cardiac concerns or symptoms Strength training completed today  Goals Unmet:  Not Applicable  Comments: Pt able to follow exercise prescription today without complaint.  Will continue to monitor for progression.    Dr. Emily Filbert is Medical Director for [place] and [facility].

## 2017-03-08 ENCOUNTER — Telehealth: Payer: Self-pay | Admitting: *Deleted

## 2017-03-08 NOTE — Telephone Encounter (Signed)
Fred left a message stating that he hurt his back over the weekend and will be out today.

## 2017-03-15 ENCOUNTER — Encounter: Payer: Medicare Other | Admitting: *Deleted

## 2017-03-15 DIAGNOSIS — Z951 Presence of aortocoronary bypass graft: Secondary | ICD-10-CM

## 2017-03-15 NOTE — Progress Notes (Signed)
Daily Session Note  Patient Details  Name: [name] MRN: [id number] Date of Birth: 1946-11-16 Referring Provider:     Cardiac Rehab from 02/11/2017 in [facility] Cardiac and Pulmonary Rehab  Referring Provider  Serafina Royals MD      Encounter Date: 03/15/2017  Check In:     Session Check In - 03/15/17 0804      Check-In   Location [facility]-Cardiac & Pulmonary Rehab   Staff Present Gerlene Burdock, RN, Moises Blood, BS, ACSM CEP, Exercise Physiologist;Jessica Luan [place], [place], ACSM RCEP, Exercise Physiologist   Supervising physician immediately available to respond to emergencies See telemetry face sheet for immediately available ER MD   Medication changes reported     No   Fall or balance concerns reported    No   Warm-up and Cool-down Performed on first and last piece of equipment   Resistance Training Performed Yes   VAD Patient? No     Pain Assessment   Currently in Pain? No/denies   Multiple Pain Sites No         History  Smoking Status  . Never Smoker  Smokeless Tobacco  . Never Used    Goals Met:  Proper associated with RPD/PD & O2 Sat Independence with exercise equipment Exercise tolerated well Strength training completed today  Goals Unmet:  Not Applicable  Comments: Pt able to follow exercise prescription today without complaint.  Will continue to monitor for progression.    Dr. Emily Filbert is Medical Director for [place] and [facility].

## 2017-03-17 DIAGNOSIS — Z951 Presence of aortocoronary bypass graft: Secondary | ICD-10-CM | POA: Diagnosis not present

## 2017-03-17 NOTE — Progress Notes (Signed)
Daily Session Note  Patient Details  Name: [name] MRN: [id number] Date of Birth: [birth year] Referring Provider:     Cardiac Rehab from 02/11/2017 in [facility] Cardiac and Pulmonary Rehab  Referring Provider  Serafina Royals MD      Encounter Date: 03/17/2017  Check In:     Session Check In - 03/17/17 0828      Check-In   Location [facility]-Cardiac & Pulmonary Rehab   Staff Present Alberteen Sam, MA, ACSM RCEP, Exercise Physiologist;Susanne Bice, RN, BSN, Lance Sell, BA, ACSM CEP, Exercise Physiologist   Supervising physician immediately available to respond to emergencies See telemetry face sheet for immediately available ER MD   Medication changes reported     No   Fall or balance concerns reported    No   Warm-up and Cool-down Performed on first and last piece of equipment   Resistance Training Performed Yes   VAD Patient? No     Pain Assessment   Currently in Pain? No/denies           Exercise Prescription Changes - 03/16/17 1500      Response to Exercise   Blood Pressure (Admit) 142/70   Blood Pressure (Exercise) 168/82   Blood Pressure (Exit) 146/65   Heart Rate (Admit) 68 bpm   Heart Rate (Exercise) 94 bpm   Heart Rate (Exit) 82 bpm   Rating of Perceived Exertion (Exercise) 12   Symptoms none   Duration Continue with 45 min of aerobic exercise without signs/symptoms of physical distress.   Intensity THRR unchanged     Progression   Progression Continue to progress workloads to maintain intensity without signs/symptoms of physical distress.   Average METs 2.71     Resistance Training   Training Prescription Yes   Weight 3 lbs   Reps 10-15     Interval Training   Interval Training No     Treadmill   MPH 1.5   Grade 0   Minutes 15   METs 2.15     NuStep   Level 2   Minutes 15   METs 3     Biostep-RELP   Level 10   Minutes 15   METs 2     Home Exercise Plan   Plans to continue exercise at Home (comment)  walking   Frequency Add 2 additional days to program exercise sessions.   Initial Home Exercises Provided 02/24/17      History  Smoking Status  . Never Smoker  Smokeless Tobacco  . Never Used    Goals Met:  Independence with exercise equipment Exercise tolerated well Personal goals reviewed No report of cardiac concerns or symptoms Strength training completed today  Goals Unmet:  Not Applicable  Comments: Pt able to follow exercise prescription today without complaint.  Will continue to monitor for progression.    Dr. Emily Filbert is Medical Director for [place] and [facility].

## 2017-03-19 DIAGNOSIS — Z951 Presence of aortocoronary bypass graft: Secondary | ICD-10-CM

## 2017-03-19 NOTE — Progress Notes (Signed)
Daily Session Note  Patient Details  Name: NAVON KOTOWSKI MRN: [id number] Date of Birth: [birth year] Referring Provider:     Cardiac Rehab from 02/11/2017 in [facility] Cardiac and Pulmonary Rehab  Referring Provider  Serafina Royals MD      Encounter Date: 03/19/2017  Check In:     Session Check In - 03/19/17 0838      Check-In   Location [facility]-Cardiac & Pulmonary Rehab   Staff Present Nyoka Cowden, RN, BSN, Francene Boyers, DPT, CEEA  Darel Hong RN BSN   Supervising physician immediately available to respond to emergencies See telemetry face sheet for immediately available ER MD   Medication changes reported     No   Fall or balance concerns reported    No   Tobacco Cessation No Change   Warm-up and Cool-down Performed on first and last piece of equipment   Resistance Training Performed Yes   VAD Patient? No     Pain Assessment   Currently in Pain? No/denies   Multiple Pain Sites No         History  Smoking Status  . Never Smoker  Smokeless Tobacco  . Never Used    Goals Met:  Exercise tolerated well  Goals Unmet:  Not Applicable  Comments: Patient completed exercise prescription and all exercise goals during rehab session. The exercise was tolerated well and the patient is progressing in the program.    Dr. Emily Filbert is Medical Director for [place] and [facility].

## 2017-03-22 ENCOUNTER — Encounter: Payer: Medicare Other | Admitting: *Deleted

## 2017-03-22 DIAGNOSIS — Z951 Presence of aortocoronary bypass graft: Secondary | ICD-10-CM | POA: Diagnosis not present

## 2017-03-22 NOTE — Progress Notes (Signed)
Daily Session Note  Patient Details  Name: [name] MRN: [id number] Date of Birth: 11-16-1946 Referring Provider:     Cardiac Rehab from 02/11/2017 in [facility] Cardiac and Pulmonary Rehab  Referring Provider  Serafina Royals MD      Encounter Date: 03/22/2017  Check In:     Session Check In - 03/22/17 0758      Check-In   Location [facility]-Cardiac & Pulmonary Rehab   Staff Present Alberteen Sam, MA, ACSM RCEP, Exercise Physiologist;Velina Drollinger Amedeo Plenty, BS, ACSM CEP, Exercise Physiologist;Carroll Enterkin, RN, BSN   Supervising physician immediately available to respond to emergencies See telemetry face sheet for immediately available ER MD   Medication changes reported     No   Fall or balance concerns reported    No   Warm-up and Cool-down Performed on first and last piece of equipment   Resistance Training Performed Yes   VAD Patient? No     Pain Assessment   Currently in Pain? No/denies   Multiple Pain Sites No         History  Smoking Status  . Never Smoker  Smokeless Tobacco  . Never Used    Goals Met:  Independence with exercise equipment Exercise tolerated well No report of cardiac concerns or symptoms Strength training completed today  Goals Unmet:  Not Applicable  Comments: Pt able to follow exercise prescription today without complaint.  Will continue to monitor for progression.    Dr. Emily Filbert is Medical Director for [place] and [facility].

## 2017-03-24 ENCOUNTER — Encounter: Payer: Self-pay | Admitting: *Deleted

## 2017-03-24 ENCOUNTER — Encounter: Payer: Medicare Other | Admitting: *Deleted

## 2017-03-24 DIAGNOSIS — Z951 Presence of aortocoronary bypass graft: Secondary | ICD-10-CM

## 2017-03-24 NOTE — Progress Notes (Signed)
Daily Session Note  Patient Details  Name: [name] MRN: [id number] Date of Birth: 09/21/1946 Referring Provider:     Cardiac Rehab from 02/11/2017 in [facility] Cardiac and Pulmonary Rehab  Referring Provider  Serafina Royals MD      Encounter Date: 03/24/2017  Check In:     Session Check In - 03/24/17 0921      Check-In   Location [facility]-Cardiac & Pulmonary Rehab   Staff Present Alberteen Sam, MA, ACSM RCEP, Exercise Physiologist;Susanne Bice, RN, BSN, Lance Sell, BA, ACSM CEP, Exercise Physiologist   Supervising physician immediately available to respond to emergencies See telemetry face sheet for immediately available ER MD   Medication changes reported     No   Fall or balance concerns reported    No   Warm-up and Cool-down Performed on first and last piece of equipment   Resistance Training Performed Yes   VAD Patient? No     Pain Assessment   Currently in Pain? No/denies   Multiple Pain Sites No         History  Smoking Status  . Never Smoker  Smokeless Tobacco  . Never Used    Goals Met:  Independence with exercise equipment Exercise tolerated well No report of cardiac concerns or symptoms Strength training completed today  Goals Unmet:  Not Applicable  Comments: Pt able to follow exercise prescription today without complaint.  Will continue to monitor for progression.    Dr. Emily Filbert is Medical Director for [place] and [facility].

## 2017-03-24 NOTE — Progress Notes (Signed)
Cardiac Individual Treatment Plan  Patient Details  [name]: Fred [name] MRN: [id number] Date of Birth: July 01, 1946 Referring Provider:     Cardiac Rehab from 02/11/2017 in [facility] Cardiac and Pulmonary Rehab  Referring Provider  Serafina Royals MD      Initial Encounter Date:    Cardiac Rehab from 02/11/2017 in [facility] Cardiac and Pulmonary Rehab  Date  02/11/17  Referring Provider  Serafina Royals MD      Visit Diagnosis: S/P CABG x 3  Patient's Home Medications on Admission:  Current Outpatient Prescriptions:  .  aspirin 81 MG chewable tablet, Chew 1 tablet (81 mg total) by mouth before cath procedure., Disp: 90 tablet, Rfl: 4 .  cholecalciferol (VITAMIN D) 1000 UNITS tablet, Take 1,000 Units by mouth daily., Disp: , Rfl:  .  finasteride (PROSCAR) 5 MG tablet, Take 5 mg by mouth daily., Disp: , Rfl:  .  insulin aspart (NOVOLOG) 100 UNIT/ML injection, Inject 20 Units into the skin 3 (three) times daily before meals., Disp: , Rfl:  .  insulin glargine (LANTUS) 100 UNIT/ML injection, Inject 75 Units into the skin at bedtime., Disp: , Rfl:  .  lisinopril (PRINIVIL,ZESTRIL) 20 MG tablet, Take 20 mg by mouth daily., Disp: , Rfl:  .  metFORMIN (GLUCOPHAGE) 850 MG tablet, Take 850 mg by mouth 3 (three) times daily., Disp: , Rfl:  .  nortriptyline (PAMELOR) 10 MG capsule, Take 30 mg by mouth at bedtime., Disp: , Rfl:  .  PARoxetine (PAXIL) 40 MG tablet, Take 40 mg by mouth every morning., Disp: , Rfl:  .  prazosin (MINIPRESS) 5 MG capsule, Take 5 mg by mouth 2 (two) times daily. , Disp: , Rfl:  .  rOPINIRole (REQUIP) 0.5 MG tablet, Take 0.5 mg by mouth at bedtime., Disp: , Rfl:  .  simvastatin (ZOCOR) 40 MG tablet, Take 40 mg by mouth at bedtime., Disp: , Rfl:  .  sucralfate (CARAFATE) 1 g tablet, Take 1 tablet (1 g total) by mouth 4 (four) times daily. (Patient not taking: Reported on 10/28/2016), Disp: 60 tablet, Rfl: 0 .  tamsulosin (FLOMAX) 0.4 MG CAPS capsule, Take 0.4 mg by mouth  daily., Disp: , Rfl:  .  tiZANidine (ZANAFLEX) 2 MG tablet, Take 2 mg by mouth at bedtime., Disp: , Rfl:  .  traMADol-acetaminophen (ULTRACET) 37.5-325 MG tablet, Take 1 tablet by mouth every 6 (six) hours as needed. , Disp: , Rfl:  .  vitamin B-12 (CYANOCOBALAMIN) 1000 MCG tablet, Take 1,000 mcg by mouth at bedtime., Disp: , Rfl:   Past Medical History: Past Medical History:  Diagnosis Date  . BPH (benign prostatic hyperplasia)   . Depression   . Diabetes mellitus without complication ([place])   . HLD (hyperlipidemia)   . Hypertension   . Neuropathy ([place])   . PTSD (post-traumatic stress disorder)   . Stroke [facility])     Tobacco Use: History  Smoking Status  . Never Smoker  Smokeless Tobacco  . Never Used    Labs: Recent Review Flowsheet Data    Labs for ITP Cardiac and Pulmonary Rehab Latest Ref Rng & Units 06/27/2016 06/28/2016   Cholestrol 0 - 200 mg/dL - 200   LDLCALC 0 - 99 mg/dL - UNABLE TO CALCULATE IF TRIGLYCERIDE OVER 400 mg/dL   HDL >40 mg/dL - 29(L)   Trlycerides <150 mg/dL - 515(H)   Hemoglobin A1c 4.0 - 6.0 % 13.3(H) -       Exercise Target Goals:    Exercise Program Goal:  Individual exercise prescription set with THRR, safety & activity barriers. Participant demonstrates ability to understand and report RPE using BORG scale, to self-measure pulse accurately, and to acknowledge the importance of the exercise prescription.  Exercise Prescription Goal: Starting with aerobic activity 30 plus minutes a day, 3 days per week for initial exercise prescription. Provide home exercise prescription and guidelines that participant acknowledges understanding prior to discharge.  Activity Barriers & Risk Stratification:     Activity Barriers & Cardiac Risk Stratification - 02/11/17 1536      Activity Barriers & Cardiac Risk Stratification   Activity Barriers Other (comment)   Comments R knee "neuropathy" per patient report.  States he uses a cane as a "crutch".  Has  struggled with R knee for approx 3 years.    Cardiac Risk Stratification High      6 Minute Walk:     6 Minute Walk    Fred [name] 02/11/17 1456         6 Minute Walk   Phase Initial     Distance 785 feet     Walk Time 5.83 minutes     # of Rest Breaks 1  10 sec     MPH 1.5     METS 1.75     RPE 13     VO2 Peak 6.12     Symptoms Yes (comment)     Comments complaint of left hip pain 3/10     Resting HR 80 bpm     Resting BP 122/70     Max Ex. HR 109 bpm     Max Ex. BP 126/64        Oxygen Initial Assessment:   Oxygen Re-Evaluation:   Oxygen Discharge (Final Oxygen Re-Evaluation):   Initial Exercise Prescription:     Initial Exercise Prescription - 02/11/17 1400      Date of Initial Exercise RX and Referring Provider   Date 02/11/17   Referring Provider Serafina Royals MD     Treadmill   MPH 1.5   Grade 0   Minutes 15   METs 1.7     NuStep   Level 1   Minutes 15   METs 1.5     Biostep-RELP   Level 1   Minutes 15   METs 1     Prescription Details   Frequency (times per week) 3   Duration Progress to 45 minutes of aerobic exercise without signs/symptoms of physical distress     Intensity   THRR 40-80% of Max Heartrate 107-135   Ratings of Perceived Exertion 11-13   Perceived Dyspnea 0-4     Progression   Progression Continue to progress workloads to maintain intensity without signs/symptoms of physical distress.     Resistance Training   Training Prescription Yes   Weight 3 lbs   Reps 10-15      Perform Capillary Blood Glucose checks as needed.  Exercise Prescription Changes:     Exercise Prescription Changes    Fred [name] 02/11/17 1400 02/24/17 0800 03/02/17 1500 03/16/17 1500       Response to Exercise   Blood Pressure (Admit) 122/70  - 142/64 142/70    Blood Pressure (Exercise) 126/64  - 138/50 168/82    Blood Pressure (Exit)  -  - 126/74 146/65    Heart Rate (Admit) 80 bpm  - 89 bpm 68 bpm    Heart Rate (Exercise) 109 bpm   - 90 bpm 94 bpm    Heart Rate (Exit)  85 bpm  - 85 bpm 82 bpm    Oxygen Saturation (Admit) 97 %  -  -  -    Oxygen Saturation (Exercise) 96 %  -  -  -    Rating of Perceived Exertion (Exercise) 13  - 11 12    Symptoms left hip pain 3/10  - none none    Duration  -  - Continue with 45 min of aerobic exercise without signs/symptoms of physical distress. Continue with 45 min of aerobic exercise without signs/symptoms of physical distress.    Intensity  -  - THRR unchanged THRR unchanged      Progression   Progression  -  - Continue to progress workloads to maintain intensity without signs/symptoms of physical distress. Continue to progress workloads to maintain intensity without signs/symptoms of physical distress.    Average METs  -  - 2.34 2.71      Resistance Training   Training Prescription  - Yes Yes Yes    Weight  - 3 lbs 3 lbs 3 lbs    Reps  - 10-15 10-15 10-15      Interval Training   Interval Training  -  -  - No      Treadmill   MPH  - 1.5 1.4 1.5    Grade  - 0 0 0    Minutes  - _0 METs  - 1.7 2.07 2.15      NuStep   Level  - _1 Minutes  - _2 METs  - 1.5 2.6 3      Biostep-RELP   Level  - 1  - 10    Minutes  - 15  - 15    METs  - 1  - 2      Home Exercise Plan   Plans to continue exercise at  - Home (comment)  walking Home (comment)  walking Home (comment)  walking    Frequency  - Add 2 additional days to program exercise sessions. Add 2 additional days to program exercise sessions. Add 2 additional days to program exercise sessions.    Initial Home Exercises Provided  - 02/24/17 02/24/17 02/24/17      Exercise Review   Progression -  walk test results -  walk test results  -  -       Exercise Comments:     Exercise Comments    Fred [name] 02/17/17 0950 02/24/17 0926         Exercise Comments First full day of exercise!  Patient was oriented to gym and equipment including functions, settings, policies, and procedures.  Patient's  individual exercise prescription and treatment plan were reviewed.  All starting workloads were established based on the results of the 6 minute walk test done at initial orientation visit.  The plan for exercise progression was also introduced and progression will be customized based on patient's performance and goals. Reviewed METs average and discussed progression with pt today.         Exercise Goals and Review:   Exercise Goals Re-Evaluation :     Exercise Goals Re-Evaluation    Fred [name] 02/24/17 0850 03/02/17 1553 03/16/17 1540         Exercise Goal Re-Evaluation   Exercise Goals Review Increase Physical Activity;Increase Strenth and Stamina Increase Physical Activity;Increase Strenth and Stamina Increase Physical Activity;Increase Strenth and Stamina     Comments Reviewed  home exercise with pt today.  Pt plans to walk at home for exercise.  Reviewed THR, pulse, RPE, sign and symptoms, and when to call 911 or MD.  Also discussed weather considerations and indoor options.  Pt voiced understanding. Fred [name] has been doing well in rehab.  We reviewed home exercise last week.  On Monday, he was able to do the NuStep on level 3 for 3 minutes.  We will try to bump up his workload some tomorrow.  We will continue to monitor his progression. Fred [name] continues to do well in rehab.  He is trying to walk more at home on his off days.  He is now doing 3 METs on the NuStep and on level 10 on the BioStep.  Over the weekend he had a foot ulcer open up.  For now, he will stay off the treadmill.  We will continue to monitor his progression.     Expected Outcomes Short: Fred [name] will start exercising some at home.  Long: Fred [name] will become a regular exerciser at home. Short: Fred [name] will start to add exercise in at home.  Long: Continue to come to classes to work on strength and stamina. Short: Fred [name] will continue to work on his workloads on the [employer].  Long: Continue to come to classes to work on strength and stamina.         Discharge Exercise Prescription (Final Exercise Prescription Changes):     Exercise Prescription Changes - 03/16/17 1500      Response to Exercise   Blood Pressure (Admit) 142/70   Blood Pressure (Exercise) 168/82   Blood Pressure (Exit) 146/65   Heart Rate (Admit) 68 bpm   Heart Rate (Exercise) 94 bpm   Heart Rate (Exit) 82 bpm   Rating of Perceived Exertion (Exercise) 12   Symptoms none   Duration Continue with 45 min of aerobic exercise without signs/symptoms of physical distress.   Intensity THRR unchanged     Progression   Progression Continue to progress workloads to maintain intensity without signs/symptoms of physical distress.   Average METs 2.71     Resistance Training   Training Prescription Yes   Weight 3 lbs   Reps 10-15     Interval Training   Interval Training No     Treadmill   MPH 1.5   Grade 0   Minutes 15   METs 2.15     NuStep   Level 2   Minutes 15   METs 3     Biostep-RELP   Level 10   Minutes 15   METs 2     Home Exercise Plan   Plans to continue exercise at Home (comment)  walking   Frequency Add 2 additional days to program exercise sessions.   Initial Home Exercises Provided 02/24/17      Nutrition:  Target Goals: Understanding of nutrition guidelines, daily intake of sodium <1570m, cholesterol <2057m calories 30% from fat and 7% or less from saturated fats, daily to have 5 or more servings of fruits and vegetables.  Biometrics:     Pre Biometrics - 02/11/17 1504      Pre Biometrics   Height 6' (1.829 m)   Weight 238 lb 1.6 oz (108 kg)   Waist Circumference 45.5 inches   Hip Circumference 45 inches   Waist to Hip Ratio 1.01 %   BMI (Calculated) 32.4       Nutrition Therapy Plan and Nutrition Goals:     Nutrition Therapy & Goals - 03/22/17  1122      Nutrition Therapy   Diet Instructed on a meal plan based on heart healthy and diabetes guidelines.   Drug/Food Interactions Statins/Certain Fruits   Protein  (specify units) 8   Fiber 30 grams   Whole Grain Foods 3 servings   Saturated Fats 13 max. grams   Fruits and Vegetables 5 servings/day   Sodium 1500 grams     Personal Nutrition Goals   Nutrition Goal Since patient writes down his schedule for the day, encourage to write out simple menus especially for evening meals.   Personal Goal #2 Add fruit or yogurt to lunch meal.   Personal Goal #3 Try lower sodium products such as very low sodium tuna, frozen vegetables, no salt added canned vegetables.   Personal Goal #4 Include a protein food with all meals.   Additional Goals? Yes   Personal Goal #5 When daughter comes next week, ask her to help in preparing some meals that can be frozen.   Personal Goal #6 Balance meals with protein, starch, and non-starchy vegetables. Use food guide plate as a guide.   Comments Patient lives alone and either picks up meals or prepares very simple meals at home.  Gave simple meal ideas based on his preferences and foods available.     Intervention Plan   Intervention Prescribe, educate and counsel regarding individualized specific dietary modifications aiming towards targeted core components such as weight, hypertension, lipid management, diabetes, heart failure and other comorbidities.;Nutrition handout(s) given to patient.   Expected Outcomes Short Term Goal: Understand basic principles of dietary content, such as calories, fat, sodium, cholesterol and nutrients.;Short Term Goal: A plan has been developed with personal nutrition goals set during dietitian appointment.;Long Term Goal: Adherence to prescribed nutrition plan.      Nutrition Discharge: Rate Your Plate Scores:     Nutrition Assessments - 02/11/17 1527      MEDFICTS Scores   Pre Score 66      Nutrition Goals Re-Evaluation:     Nutrition Goals Re-Evaluation    Fred [name] 03/17/17 0912             Goals   Current Weight 243 lb (110.2 kg)       Comment Appt scheduled for Monday 3/26        Expected Outcome help with menu planning and diet          Nutrition Goals Discharge (Final Nutrition Goals Re-Evaluation):     Nutrition Goals Re-Evaluation - 03/17/17 0912      Goals   Current Weight 243 lb (110.2 kg)   Comment Appt scheduled for Monday 3/26   Expected Outcome help with menu planning and diet      Psychosocial: Target Goals: Acknowledge presence or absence of significant depression and/or stress, maximize coping skills, provide positive support system. Participant is able to verbalize types and ability to use techniques and skills needed for reducing stress and depression.   Initial Review & Psychosocial Screening:     Initial Psych Review & Screening - 02/11/17 1531      Initial Review   Current issues with Current Anxiety/Panic  PTSD from [place]? Yes   Concerns Recent loss of significant other   Comments lost his wife in August 2017     Barriers   Psychosocial barriers to participate in program The patient should benefit from training in stress management and relaxation.     Screening  Interventions   Interventions Encouraged to exercise;Program counselor consult      Quality of Life Scores:      Quality of Life - 02/11/17 1534      Quality of Life Scores   Health/Function Pre 23.54 %   Socioeconomic Pre 24.58 %   Psych/Spiritual Pre 29.5 %   Family Pre 22.88 %   GLOBAL Pre 24.69 %      PHQ-9: Recent Review Flowsheet Data    Depression screen [facility] 2/9 02/11/2017 09/17/2016   Decreased Interest 2 0   Down, Depressed, Hopeless 1 1    PHQ - 2 Score 3 1   Altered sleeping 3 -   Tired, decreased energy 3 -   Change in appetite 2 -   Feeling bad or failure about yourself  0 -   Trouble concentrating 2 -   Moving slowly or fidgety/restless 1 -   Suicidal thoughts 0 -   PHQ-9 Score 14 -   Difficult doing work/chores Somewhat difficult -     Interpretation of Total Score  Total Score  Depression Severity:  1-4 = Minimal depression, 5-9 = Mild depression, 10-14 = Moderate depression, 15-19 = Moderately severe depression, 20-27 = Severe depression   Psychosocial Evaluation and Intervention:     Psychosocial Evaluation - 02/17/17 1133      Psychosocial Evaluation & Interventions   Comments Counselor met with Fred [name]) today for initial psychosocial evaluation.  He is a 71 year old who had triple bypass surgery this past Fall.  He lives alone subsequent to his spouse passing away 7 months ago after 37 years of marriage.  He has a son and daughter in law who live close by and several neighbors who are part of his support system.  Fred [name] reports sleeping well and having a "decent" appetite although with having a stroke last year, as well as being a diabetic - hard to find healthy options to eat.  Fred [name] reports a history of anxiety and a diagnosis of PTSD since the 1990's.  He has been on medications for this and states they work well; although he has noticed more sadness since the loss of his spouse understandably.  Fred [name] reports his health and recovering from the loss of his spouse are his primary stressors; as well as doing his annual tax preparation.  He has goals to get his energy back and lose some weight.  counselor recommended speaking with the dietician as well as participating in the nutrition education to address his weight loss and appetite concerns.  Counselor and staff will be following with Fred [name] throughout the course of this program.     Continue Psychosocial Services  Yes      Psychosocial Re-Evaluation:     Psychosocial Re-Evaluation    Fred [name] 03/17/17 0930             Psychosocial Re-Evaluation   Comments Counselor follow up with Fred [name] today reporting he has experienced increased energy since coming into this program.  He continues to feel more sad than is typical; which increased following the death of his spouse last 13-Aug-2023.   Fred notices his loss of  motivation to do things he once enjoyed as well.   He continues to sleep well and has a good appetite.  He was excited that his daughter is coming to visit from [place] next week and she will cook for him; which his wife always did; so he has missed this.  Counselor and staff will  continue to follow with Fred [name] on his mood and goals for this program.        Expected Outcomes Fred [name] will continue to exercise according to his prescription.  He will continue to be monitored for any mood changes either positively or negatively.  Fred [name] will continue to work on his goals to increase his stamina and strength.          Psychosocial Discharge (Final Psychosocial Re-Evaluation):     Psychosocial Re-Evaluation - 03/17/17 0930      Psychosocial Re-Evaluation   Comments Counselor follow up with Fred [name] today reporting he has experienced increased energy since coming into this program.  He continues to feel more sad than is typical; which increased following the death of his spouse last 2023-08-22.   Fred notices his loss of motivation to do things he once enjoyed as well.   He continues to sleep well and has a good appetite.  He was excited that his daughter is coming to visit from [place] next week and she will cook for him; which his wife always did; so he has missed this.  Counselor and staff will continue to follow with Fred [name] on his mood and goals for this program.    Expected Outcomes Fred [name] will continue to exercise according to his prescription.  He will continue to be monitored for any mood changes either positively or negatively.  Fred [name] will continue to work on his goals to increase his stamina and strength.      Vocational Rehabilitation: Provide vocational rehab assistance to qualifying candidates.   Vocational Rehab Evaluation & Intervention:     Vocational Rehab - 02/11/17 1537      Initial Vocational Rehab Evaluation & Intervention   Assessment shows need for Vocational Rehabilitation No       Education: Education Goals: Education classes will be provided on a weekly basis, covering required topics. Participant will state understanding/return demonstration of topics presented.  Learning Barriers/Preferences:     Learning Barriers/Preferences - 02/11/17 1536      Learning Barriers/Preferences   Learning Barriers None   Learning Preferences Individual Instruction      Education Topics: General Nutrition Guidelines/Fats and Fiber: -Group instruction provided by verbal, written material, models and posters to present the general guidelines for heart healthy nutrition. Gives an explanation and review of dietary fats and fiber.   Cardiac Rehab from 03/22/2017 in [facility] Cardiac and Pulmonary Rehab  Date  03/01/17  Educator  CR  Instruction Review Code  2- meets goals/outcomes      Controlling Sodium/Reading Food Labels: -Group verbal and written material supporting the discussion of sodium use in heart healthy nutrition. Review and explanation with models, verbal and written materials for utilization of the food label.   Exercise Physiology & Risk Factors: - Group verbal and written instruction with models to review the exercise physiology of the cardiovascular system and associated critical values. Details cardiovascular disease risk factors and the goals associated with each risk factor.   Cardiac Rehab from 03/22/2017 in [facility] Cardiac and Pulmonary Rehab  Date  03/15/17  Educator  [facility]  Instruction Review Code  2- meets goals/outcomes      Aerobic Exercise & Resistance Training: - Gives group verbal and written discussion on the health impact of inactivity. On the components of aerobic and resistive training programs and the benefits of this training and how to safely progress through these programs.   Cardiac Rehab from 03/22/2017 in [facility] Cardiac and Pulmonary Rehab  Date  03/17/17  Educator  [facility]  Instruction Review Code  2- meets goals/outcomes      Flexibility,  Balance, General Exercise Guidelines: - Provides group verbal and written instruction on the benefits of flexibility and balance training programs. Provides general exercise guidelines with specific guidelines to those with heart or lung disease. Demonstration and skill practice provided.   Cardiac Rehab from 03/22/2017 in [facility] Cardiac and Pulmonary Rehab  Date  03/22/17  Educator  [facility]  Instruction Review Code  2- meets goals/outcomes      Stress Management: - Provides group verbal and written instruction about the health risks of elevated stress, cause of high stress, and healthy ways to reduce stress.   Depression: - Provides group verbal and written instruction on the correlation between heart/lung disease and depressed mood, treatment options, and the stigmas associated with seeking treatment.   Cardiac Rehab from 03/22/2017 in [facility] Cardiac and Pulmonary Rehab  Date  03/03/17  Educator  [facility]  Instruction Review Code  2- meets goals/outcomes      Anatomy & Physiology of the Heart: - Group verbal and written instruction and models provide basic cardiac anatomy and physiology, with the coronary electrical and arterial systems. Review of: AMI, Angina, Valve disease, Heart Failure, Cardiac Arrhythmia, Pacemakers, and the ICD.   Cardiac Procedures: - Group verbal and written instruction and models to describe the testing methods done to diagnose heart disease. Reviews the outcomes of the test results. Describes the treatment choices: Medical Management, Angioplasty, or Coronary Bypass Surgery.   Cardiac Medications: - Group verbal and written instruction to review commonly prescribed medications for heart disease. Reviews the medication, class of the drug, and side effects. Includes the steps to properly store meds and maintain the prescription regimen.   Cardiac Rehab from 03/22/2017 in [facility] Cardiac and Pulmonary Rehab  Date  02/17/17 [Second Part]  Educator  CE  Instruction Review Code   2- meets goals/outcomes      Go Sex-Intimacy & Heart Disease, Get SMART - Goal Setting: - Group verbal and written instruction through game format to discuss heart disease and the return to sexual intimacy. Provides group verbal and written material to discuss and apply goal setting through the application of the S.M.A.R.T. Method.   Other Matters of the Heart: - Provides group verbal, written materials and models to describe Heart Failure, Angina, Valve Disease, and Diabetes in the realm of heart disease. Includes description of the disease process and treatment options available to the cardiac patient.   Exercise & Equipment Safety: - Individual verbal instruction and demonstration of equipment use and safety with use of the equipment.   Cardiac Rehab from 03/22/2017 in [facility] Cardiac and Pulmonary Rehab  Date  02/11/17  Educator  PS  Instruction Review Code  2- meets goals/outcomes      Infection Prevention: - Provides verbal and written material to individual with discussion of infection control including proper hand washing and proper equipment cleaning during exercise session.   Cardiac Rehab from 03/22/2017 in [facility] Cardiac and Pulmonary Rehab  Date  02/11/17  Educator  PS  Instruction Review Code  2- meets goals/outcomes      Falls Prevention: - Provides verbal and written material to individual with discussion of falls prevention and safety.   Cardiac Rehab from 03/22/2017 in [facility] Cardiac and Pulmonary Rehab  Date  02/11/17  Educator  PS  Instruction Review Code  2- meets goals/outcomes      Diabetes: - Individual verbal and written instruction to review signs/symptoms  of diabetes, desired ranges of glucose level fasting, after meals and with exercise. Advice that pre and post exercise glucose checks will be done for 3 sessions at entry of program.   Cardiac Rehab from 03/22/2017 in [facility] Cardiac and Pulmonary Rehab  Date  02/11/17  Educator  PS  Instruction Review Code   2- meets goals/outcomes       Knowledge Questionnaire Score:     Knowledge Questionnaire Score - 02/11/17 1536      Knowledge Questionnaire Score   Pre Score 15/28      Core Components/Risk Factors/Patient Goals at Admission:     Personal Goals and Risk Factors at Admission - 02/11/17 1512      Core Components/Risk Factors/Patient Goals on Admission    Weight Management Obesity;Weight Gain;Yes   Intervention Weight Management: Develop a combined nutrition and exercise program designed to reach desired caloric intake, while maintaining appropriate intake of nutrient and fiber, sodium and fats, and appropriate energy expenditure required for the weight goal.;Weight Management: Provide education and appropriate resources to help participant work on and attain dietary goals.;Weight Management/Obesity: Establish reasonable short term and long term weight goals.;Obesity: Provide education and appropriate resources to help participant work on and attain dietary goals.   Admit Weight 238 lb 1.6 oz (108 kg)   Goal Weight: Short Term 233 lb (105.7 kg)   Goal Weight: Long Term 200 lb (90.7 kg)   Expected Outcomes Short Term: Continue to assess and modify interventions until short term weight is achieved;Long Term: Adherence to nutrition and physical activity/exercise program aimed toward attainment of established weight goal;Weight Loss: Understanding of general recommendations for a balanced deficit meal plan, which promotes 1-2 lb weight loss per week and includes a negative energy balance of [phone] kcal/d   Sedentary Yes   Intervention Provide advice, education, support and counseling about physical activity/exercise needs.;Develop an individualized exercise prescription for aerobic and resistive training based on initial evaluation findings, risk stratification, comorbidities and participant's personal goals.   Expected Outcomes Achievement of increased cardiorespiratory fitness and enhanced  flexibility, muscular endurance and strength shown through measurements of functional capacity and personal statement of participant.   Increase Strength and Stamina Yes   Intervention Provide advice, education, support and counseling about physical activity/exercise needs.;Develop an individualized exercise prescription for aerobic and resistive training based on initial evaluation findings, risk stratification, comorbidities and participant's personal goals.   Expected Outcomes Achievement of increased cardiorespiratory fitness and enhanced flexibility, muscular endurance and strength shown through measurements of functional capacity and personal statement of participant.   Improve shortness of breath with ADL's Yes   Intervention Provide education, individualized exercise plan and daily activity instruction to help decrease symptoms of SOB with activities of daily living.   Expected Outcomes Short Term: Achieves a reduction of symptoms when performing activities of daily living.   Diabetes Yes   Intervention Provide education about signs/symptoms and action to take for hypo/hyperglycemia.;Provide education about proper nutrition, including hydration, and aerobic/resistive exercise prescription along with prescribed medications to achieve blood glucose in normal ranges: Fasting glucose 65-99 mg/dL   Expected Outcomes Short Term: Participant verbalizes understanding of the signs/symptoms and immediate care of hyper/hypoglycemia, proper foot care and importance of medication, aerobic/resistive exercise and nutrition plan for blood glucose control.;Long Term: Attainment of HbA1C < 7%.   Hypertension Yes   Intervention Provide education on lifestyle modifcations including regular physical activity/exercise, weight management, moderate sodium restriction and increased consumption of fresh fruit, vegetables, and low fat dairy, alcohol  moderation, and smoking cessation.;Monitor prescription use compliance.    Expected Outcomes Short Term: Continued assessment and intervention until BP is < 140/88m HG in hypertensive participants. < 130/876mHG in hypertensive participants with diabetes, heart failure or chronic kidney disease.;Long Term: Maintenance of blood pressure at goal levels.   Lipids Yes   Intervention Provide education and support for participant on nutrition & aerobic/resistive exercise along with prescribed medications to achieve LDL <708mHDL >13m5m Expected Outcomes Short Term: Participant states understanding of desired cholesterol values and is compliant with medications prescribed. Participant is following exercise prescription and nutrition guidelines.;Long Term: Cholesterol controlled with medications as prescribed, with individualized exercise RX and with personalized nutrition plan. Value goals: LDL < 70mg49mL > 40 mg.      Core Components/Risk Factors/Patient Goals Review:      Goals and Risk Factor Review    Fred [name] 03/17/17 0904             Core Components/Risk Factors/Patient Goals Review   Personal Goals Review Weight Management/Obesity;Hypertension;Diabetes;Improve shortness of breath with ADL's;Lipids       Review Fred Fred Machooing well in rehab. It has been a good outlet for him.  Fred's weight has been creeping up as his diet is not going as well.  His blood pressures and blood sugars have been good.  His A1C is down.  He is keeping his medications on a schedule which has been very helpful for him.   He is breathing better and able to get more stuff done around the house.         Expected Outcomes Short: Fred Fred [name] refocus his diet to try to lose weight.  He has an appt to meet with the dietician on Monday.  Long: Work on risk factor moification.          Core Components/Risk Factors/Patient Goals at Discharge (Final Review):      Goals and Risk Factor Review - 03/17/17 0904      Core Components/Risk Factors/Patient Goals Review   Personal Goals Review Weight  Management/Obesity;Hypertension;Diabetes;Improve shortness of breath with ADL's;Lipids   Review Fred Fred Machooing well in rehab. It has been a good outlet for him.  Fred's weight has been creeping up as his diet is not going as well.  His blood pressures and blood sugars have been good.  His A1C is down.  He is keeping his medications on a schedule which has been very helpful for him.   He is breathing better and able to get more stuff done around the house.     Expected Outcomes Short: Fred Fred [name] refocus his diet to try to lose weight.  He has an appt to meet with the dietician on Monday.  Long: Work on risk factor moification.      ITP Comments:     ITP Comments    Fred [name] 02/11/17 1504 02/24/17 0548 03/24/17 0548       ITP Comments Medical Review completed;  initial ITP completed;  [facility] 30 day review. Continue with ITP unless directed changes per Medical Director review 30 day review. Continue with ITP unless directed changes per Medical Director review        Comments:

## 2017-03-26 ENCOUNTER — Encounter: Payer: Medicare Other | Admitting: *Deleted

## 2017-03-26 DIAGNOSIS — Z951 Presence of aortocoronary bypass graft: Secondary | ICD-10-CM

## 2017-03-26 NOTE — Progress Notes (Signed)
Daily Session Note  Patient Details  Name: [name] MRN: [id number] Date of Birth: 21-Jun-1946 Referring Provider:     Cardiac Rehab from 02/11/2017 in [facility] Cardiac and Pulmonary Rehab  Referring Provider  Serafina Royals MD      Encounter Date: 03/26/2017  Check In:     Session Check In - 03/26/17 0841      Check-In   Location [facility]-Cardiac & Pulmonary Rehab   Staff Present Gerlene Burdock, RN, BSN;Susanne Bice, RN, BSN, Gordy Councilman, MA, ACSM RCEP, Exercise Physiologist   Supervising physician immediately available to respond to emergencies See telemetry face sheet for immediately available ER MD   Medication changes reported     No   Fall or balance concerns reported    No   Warm-up and Cool-down Performed on first and last piece of equipment   Resistance Training Performed No     Pain Assessment   Currently in Pain? No/denies         History  Smoking Status  . Never Smoker  Smokeless Tobacco  . Never Used    Goals Met:  Proper associated with RPD/PD & O2 Sat Personal goals reviewed  Goals Unmet:  Not Applicable  Comments:     Dr. Emily Filbert is Medical Director for [place] and [facility].

## 2017-03-29 ENCOUNTER — Encounter: Payer: Medicare Other | Attending: Internal Medicine | Admitting: *Deleted

## 2017-03-29 DIAGNOSIS — Z951 Presence of aortocoronary bypass graft: Secondary | ICD-10-CM | POA: Insufficient documentation

## 2017-03-29 NOTE — Progress Notes (Signed)
Daily Session Note  Patient Details  Name: [name] MRN: [id number] Date of Birth: 10/13/1946 Referring Provider:     Cardiac Rehab from 02/11/2017 in [facility] Cardiac and Pulmonary Rehab  Referring Provider  Serafina Royals MD      Encounter Date: 03/29/2017  Check In:     Session Check In - 03/29/17 0758      Check-In   Location [facility]-Cardiac & Pulmonary Rehab   Staff Present Alberteen Sam, MA, ACSM RCEP, Exercise Physiologist;[name] Amedeo Plenty, BS, ACSM CEP, Exercise Physiologist;Carroll Enterkin, RN, BSN   Supervising physician immediately available to respond to emergencies See telemetry face sheet for immediately available ER MD   Medication changes reported     No   Fall or balance concerns reported    No   Warm-up and Cool-down Performed on first and last piece of equipment   Resistance Training Performed Yes   VAD Patient? No     Pain Assessment   Currently in Pain? No/denies   Multiple Pain Sites No         History  Smoking Status  . Never Smoker  Smokeless Tobacco  . Never Used    Goals Met:  Independence with exercise equipment Exercise tolerated well No report of cardiac concerns or symptoms Strength training completed today  Goals Unmet:  Not Applicable  Comments: Pt able to follow exercise prescription today without complaint.  Will continue to monitor for progression.    Dr. Emily Filbert is Medical Director for [place] and [facility].

## 2017-04-05 ENCOUNTER — Encounter: Payer: Medicare Other | Admitting: *Deleted

## 2017-04-05 DIAGNOSIS — Z951 Presence of aortocoronary bypass graft: Secondary | ICD-10-CM

## 2017-04-05 NOTE — Progress Notes (Signed)
Daily Session Note  Patient Details  Name: [name] MRN: [id number] Date of Birth: 1946/12/24 Referring Provider:     Cardiac Rehab from 02/11/2017 in [facility] Cardiac and Pulmonary Rehab  Referring Provider  Serafina Royals MD      Encounter Date: 04/05/2017  Check In:     Session Check In - 04/05/17 0803      Check-In   Location [facility]-Cardiac & Pulmonary Rehab   Staff Present Gerlene Burdock, RN, Moises Blood, BS, ACSM CEP, Exercise Physiologist;Anjel Perfetti Luan Pulling, [place], ACSM RCEP, Exercise Physiologist   Supervising physician immediately available to respond to emergencies See telemetry face sheet for immediately available ER MD   Medication changes reported     No   Fall or balance concerns reported    No   Warm-up and Cool-down Performed on first and last piece of equipment   Resistance Training Performed Yes   VAD Patient? No     Pain Assessment   Currently in Pain? No/denies   Multiple Pain Sites No         History  Smoking Status  . Never Smoker  Smokeless Tobacco  . Never Used    Goals Met:  Independence with exercise equipment Exercise tolerated well No report of cardiac concerns or symptoms Strength training completed today  Goals Unmet:  Not Applicable  Comments: Pt able to follow exercise prescription today without complaint.  Will continue to monitor for progression.    Dr. Emily Filbert is Medical Director for [place] and [facility].

## 2017-04-07 ENCOUNTER — Encounter: Payer: Medicare Other | Admitting: *Deleted

## 2017-04-07 DIAGNOSIS — Z951 Presence of aortocoronary bypass graft: Secondary | ICD-10-CM | POA: Diagnosis not present

## 2017-04-07 NOTE — Progress Notes (Signed)
Daily Session Note  Patient Details  Name: [name] MRN: [id number] Date of Birth: Apr 08, 1946 Referring Provider:     Cardiac Rehab from 02/11/2017 in [facility] Cardiac and Pulmonary Rehab  Referring Provider  Serafina Royals MD     2 Encounter Date: 04/07/2017  Check In:     Session Check In - 04/07/17 0932      Check-In   Location [facility]-Cardiac & Pulmonary Rehab   Staff Present Alberteen Sam, MA, ACSM RCEP, Exercise Physiologist;Amanda Oletta Darter, BA, ACSM CEP, Exercise Physiologist;Susanne Bice, RN, BSN, CCRP   Supervising physician immediately available to respond to emergencies See telemetry face sheet for immediately available ER MD   Medication changes reported     No   Fall or balance concerns reported    No   Warm-up and Cool-down Performed on first and last piece of equipment   Resistance Training Performed Yes   VAD Patient? No     Pain Assessment   Currently in Pain? No/denies   Multiple Pain Sites No         History  Smoking Status  . Never Smoker  Smokeless Tobacco  . Never Used    Goals Met:  Independence with exercise equipment Exercise tolerated well Personal goals reviewed No report of cardiac concerns or symptoms Strength training completed today  Goals Unmet:  Not Applicable  Comments: Pt able to follow exercise prescription today without complaint.  Will continue to monitor for progression. See ITP for goal reveiw   Dr. Emily Filbert is Medical Director for [place] and [facility].

## 2017-04-09 ENCOUNTER — Encounter: Payer: Medicare Other | Admitting: *Deleted

## 2017-04-09 DIAGNOSIS — Z951 Presence of aortocoronary bypass graft: Secondary | ICD-10-CM

## 2017-04-09 NOTE — Progress Notes (Signed)
Daily Session Note  Patient Details  Name: [name] MRN: [id number] Date of Birth: 03/26/1946 Referring Provider:     Cardiac Rehab from 02/11/2017 in [facility] Cardiac and Pulmonary Rehab  Referring Provider  Serafina Royals MD      Encounter Date: 04/09/2017  Check In:     Session Check In - 04/09/17 0916      Check-In   Location [facility]-Cardiac & Pulmonary Rehab   Staff Present Gerlene Burdock, RN, Levie Heritage, MA, ACSM RCEP, Exercise Physiologist;Other  First full day of exercise!  Patient was oriented to gym and equipment including functions, settings, policies, and procedures.  Patient's individual exercise prescription and treatment plan were reviewed.  All starting workloads were established based o   Supervising physician immediately available to respond to emergencies See telemetry face sheet for immediately available ER MD   Medication changes reported     No   Fall or balance concerns reported    No   Tobacco Cessation No Change   Warm-up and Cool-down Performed on first and last piece of equipment   Resistance Training Performed Yes   VAD Patient? No     Pain Assessment   Currently in Pain? No/denies   Multiple Pain Sites No         History  Smoking Status  . Never Smoker  Smokeless Tobacco  . Never Used    Goals Met:  Independence with exercise equipment Exercise tolerated well No report of cardiac concerns or symptoms Strength training completed today  Goals Unmet:  Not Applicable  Comments: Pt able to follow exercise prescription today without complaint.  Will continue to monitor for progression.    Dr. Emily Filbert is Medical Director for [place] and [facility].

## 2017-04-12 ENCOUNTER — Encounter: Payer: Medicare Other | Admitting: *Deleted

## 2017-04-12 DIAGNOSIS — Z951 Presence of aortocoronary bypass graft: Secondary | ICD-10-CM | POA: Diagnosis not present

## 2017-04-12 NOTE — Progress Notes (Signed)
Daily Session Note  Patient Details  Name: [name] MRN: [id number] Date of Birth: [birth year] Referring Provider:     Cardiac Rehab from 02/11/2017 in [facility] Cardiac and Pulmonary Rehab  Referring Provider  Serafina Royals MD      Encounter Date: 04/12/2017  Check In:     Session Check In - 04/12/17 0811      Check-In   Location [facility]-Cardiac & Pulmonary Rehab   Staff Present Earlean Shawl, BS, ACSM CEP, Exercise Physiologist;Jaxson Anglin Luan Pulling, MA, ACSM RCEP, Exercise Physiologist;Carroll Enterkin, RN, BSN   Supervising physician immediately available to respond to emergencies See telemetry face sheet for immediately available ER MD   Medication changes reported     No   Fall or balance concerns reported    No   Warm-up and Cool-down Performed on first and last piece of equipment   Resistance Training Performed Yes   VAD Patient? No     Pain Assessment   Currently in Pain? No/denies   Multiple Pain Sites No         History  Smoking Status  . Never Smoker  Smokeless Tobacco  . Never Used    Goals Met:  Independence with exercise equipment Exercise tolerated well No report of cardiac concerns or symptoms Strength training completed today  Goals Unmet:  Not Applicable  Comments: Pt able to follow exercise prescription today without complaint.  Will continue to monitor for progression.    Dr. Emily Filbert is Medical Director for [place] and [facility].

## 2017-04-14 DIAGNOSIS — Z951 Presence of aortocoronary bypass graft: Secondary | ICD-10-CM | POA: Diagnosis not present

## 2017-04-14 NOTE — Progress Notes (Signed)
Daily Session Note  Patient Details  Name: NAI DASCH MRN: [id number] Date of Birth: 01/09/1946 Referring Provider:     Cardiac Rehab from 02/11/2017 in [facility] Cardiac and Pulmonary Rehab  Referring Provider  Serafina Royals MD      Encounter Date: 04/14/2017  Check In:     Session Check In - 04/14/17 0839      Check-In   Location [facility]-Cardiac & Pulmonary Rehab   Staff Present Alberteen Sam, MA, ACSM RCEP, Exercise Physiologist;Susanne Bice, RN, BSN, Lance Sell, BA, ACSM CEP, Exercise Physiologist   Supervising physician immediately available to respond to emergencies See telemetry face sheet for immediately available ER MD   Medication changes reported     No   Fall or balance concerns reported    No   Warm-up and Cool-down Performed on first and last piece of equipment   Resistance Training Performed Yes   VAD Patient? No     Pain Assessment   Currently in Pain? No/denies         History  Smoking Status  . Never Smoker  Smokeless Tobacco  . Never Used    Goals Met:  Independence with exercise equipment Exercise tolerated well No report of cardiac concerns or symptoms Strength training completed today  Goals Unmet:  Not Applicable  Comments:  Pt able to follow exercise prescription today without complaint.  Will continue to monitor for progression.     Dr. Emily Filbert is Medical Director for [place] and [facility].

## 2017-04-16 ENCOUNTER — Encounter: Payer: Medicare Other | Admitting: *Deleted

## 2017-04-16 DIAGNOSIS — Z951 Presence of aortocoronary bypass graft: Secondary | ICD-10-CM | POA: Diagnosis not present

## 2017-04-16 NOTE — Progress Notes (Signed)
Daily Session Note  Patient Details  Name: [name] MRN: [id number] Date of Birth: [birth year] Referring Provider:     Cardiac Rehab from 02/11/2017 in [facility] Cardiac and Pulmonary Rehab  Referring Provider  Serafina Royals MD      Encounter Date: 04/16/2017  Check In:     Session Check In - 04/16/17 0816      Check-In   Location [facility]-Cardiac & Pulmonary Rehab   Staff Present Gerlene Burdock, RN, BSN;Susanne Bice, RN, BSN, Gordy Councilman, MA, ACSM RCEP, Exercise Physiologist   Supervising physician immediately available to respond to emergencies See telemetry face sheet for immediately available ER MD   Medication changes reported     No   Fall or balance concerns reported    No   Warm-up and Cool-down Performed on first and last piece of equipment   Resistance Training Performed Yes   VAD Patient? No     Pain Assessment   Currently in Pain? No/denies           Exercise Prescription Changes - 04/15/17 1200      Response to Exercise   Blood Pressure (Admit) 138/80   Blood Pressure (Exercise) 128/64   Blood Pressure (Exit) 112/64   Heart Rate (Admit) 78 bpm   Heart Rate (Exercise) 100 bpm   Heart Rate (Exit) 92 bpm   Rating of Perceived Exertion (Exercise) 13   Symptoms none   Duration Continue with 45 min of aerobic exercise without signs/symptoms of physical distress.   Intensity THRR unchanged     Progression   Progression Continue to progress workloads to maintain intensity without signs/symptoms of physical distress.   Average METs 2.6     Resistance Training   Training Prescription Yes   Weight 3 lbs   Reps 10-15     Interval Training   Interval Training Yes   Equipment NuStep   Comments 2 min off 30 sec on     NuStep   Level 6   Minutes 15   METs 3.2     Biostep-RELP   Level 6   Minutes 15   METs 2     Home Exercise Plan   Plans to continue exercise at Home (comment)  walking   Frequency Add 2 additional days to program  exercise sessions.   Initial Home Exercises Provided 02/24/17      History  Smoking Status  . Never Smoker  Smokeless Tobacco  . Never Used    Goals Met:  Proper associated with RPD/PD & O2 Sat Exercise tolerated well  Goals Unmet:  Not Applicable  Comments:     Dr. Emily Filbert is Medical Director for [place] and [facility].

## 2017-04-16 NOTE — Progress Notes (Signed)
Daily Session Note  Patient Details  Name: [name] MRN: [id number] Date of Birth: 05/23/1946 Referring Provider:     Cardiac Rehab from 02/11/2017 in [facility] Cardiac and Pulmonary Rehab  Referring Provider  Serafina Royals MD      Encounter Date: 04/16/2017  Check In:     Session Check In - 04/16/17 0816      Check-In   Location [facility]-Cardiac & Pulmonary Rehab   Staff Present Gerlene Burdock, RN, BSN;Susanne Bice, RN, BSN, Gordy Councilman, MA, ACSM RCEP, Exercise Physiologist   Supervising physician immediately available to respond to emergencies See telemetry face sheet for immediately available ER MD   Medication changes reported     No   Fall or balance concerns reported    No   Warm-up and Cool-down Performed on first and last piece of equipment   Resistance Training Performed Yes   VAD Patient? No     Pain Assessment   Currently in Pain? No/denies           Exercise Prescription Changes - 04/15/17 1200      Response to Exercise   Blood Pressure (Admit) 138/80   Blood Pressure (Exercise) 128/64   Blood Pressure (Exit) 112/64   Heart Rate (Admit) 78 bpm   Heart Rate (Exercise) 100 bpm   Heart Rate (Exit) 92 bpm   Rating of Perceived Exertion (Exercise) 13   Symptoms none   Duration Continue with 45 min of aerobic exercise without signs/symptoms of physical distress.   Intensity THRR unchanged     Progression   Progression Continue to progress workloads to maintain intensity without signs/symptoms of physical distress.   Average METs 2.6     Resistance Training   Training Prescription Yes   Weight 3 lbs   Reps 10-15     Interval Training   Interval Training Yes   Equipment NuStep   Comments 2 min off 30 sec on     NuStep   Level 6   Minutes 15   METs 3.2     Biostep-RELP   Level 6   Minutes 15   METs 2     Home Exercise Plan   Plans to continue exercise at Home (comment)  walking   Frequency Add 2 additional days to program  exercise sessions.   Initial Home Exercises Provided 02/24/17      History  Smoking Status  . Never Smoker  Smokeless Tobacco  . Never Used    Goals Met:  Proper associated with RPD/PD & O2 Sat Exercise tolerated well No report of cardiac concerns or symptoms  Goals Unmet:  Not Applicable  Comments:     Dr. Emily Filbert is Medical Director for [place] and [facility].

## 2017-04-19 ENCOUNTER — Encounter: Payer: Medicare Other | Admitting: *Deleted

## 2017-04-19 VITALS — Ht 72.0 in | Wt 247.0 lb

## 2017-04-19 DIAGNOSIS — Z951 Presence of aortocoronary bypass graft: Secondary | ICD-10-CM

## 2017-04-19 NOTE — Progress Notes (Signed)
Daily Session Note  Patient Details  Name: [name] MRN: [id number] Date of Birth: [birth year] Referring Provider:     Cardiac Rehab from 02/11/2017 in [facility]  Referring Provider  Fred Royals MD      Encounter Date: 04/19/2017  Check In:     Session Check In - 04/19/17 0750      Check-In   Location [facility]-Cardiac & Pulmonary Rehab   Staff Present Fred Burdock, RN, [name], BS, ACSM CEP, Exercise Physiologist;[name], [place], ACSM RCEP, Exercise Physiologist   Supervising physician immediately available to respond to emergencies See telemetry face sheet for immediately available ER MD   Medication changes reported     No   Fall or balance concerns reported    No   Warm-up and Cool-down Performed on first and last piece of equipment   Resistance Training Performed Yes   VAD Patient? No     Pain Assessment   Currently in Pain? No/denies   Multiple Pain Sites No         History  Smoking Status  . Never Smoker  Smokeless Tobacco  . Never Used    Goals Met:  Independence with exercise equipment Exercise tolerated well Personal goals reviewed No report of cardiac concerns or symptoms Strength training completed today  Goals Unmet:  Not Applicable  Comments: 6 min walk test done today. Results reviewed with patient. 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      [place] Name 02/11/17 1456 04/19/17 0847       6 Minute Walk   Phase Initial Discharge    Distance 785 feet 1120 feet    Distance % Change  - 43 %    Walk Time 5.83 minutes 6 minutes    # of Rest Breaks 1  10 sec 6    MPH 1.5 2.12    METS 1.75 2.76    RPE 13 15    VO2 Peak 6.12 9.66    Symptoms Yes (comment) No    Comments complaint of left hip pain 3/10 [name] stated had to walk carefully/a little slower due to an open ulser he currently has on his toe.      Resting HR 80 bpm 70 bpm    Resting BP 122/70 168/82    Max Ex. HR 109 bpm 110 bpm    Max Ex. BP 126/64 188/80         Dr. Emily [name] is Medical Director for [facility].

## 2017-04-21 ENCOUNTER — Encounter: Payer: Self-pay | Admitting: *Deleted

## 2017-04-21 DIAGNOSIS — Z951 Presence of aortocoronary bypass graft: Secondary | ICD-10-CM | POA: Diagnosis not present

## 2017-04-21 NOTE — Progress Notes (Signed)
Cardiac Individual Treatment Plan  Patient Details  Name: [name] [name] MRN: [id number] Date of Birth: July 01, 1946 Referring Provider:     Cardiac Rehab from 02/11/2017 in [facility] Cardiac and Pulmonary Rehab  Referring Provider  Serafina Royals MD      Initial Encounter Date:    Cardiac Rehab from 02/11/2017 in [facility] Cardiac and Pulmonary Rehab  Date  02/11/17  Referring Provider  Serafina Royals MD      Visit Diagnosis: S/P CABG x 3  Patient's Home Medications on Admission:  Current Outpatient Prescriptions:  .  aspirin 81 MG chewable tablet, Chew 1 tablet (81 mg total) by mouth before cath procedure., Disp: 90 tablet, Rfl: 4 .  cholecalciferol (VITAMIN D) 1000 UNITS tablet, Take 1,000 Units by mouth daily., Disp: , Rfl:  .  finasteride (PROSCAR) 5 MG tablet, Take 5 mg by mouth daily., Disp: , Rfl:  .  insulin aspart (NOVOLOG) 100 UNIT/ML injection, Inject 20 Units into the skin 3 (three) times daily before meals., Disp: , Rfl:  .  insulin glargine (LANTUS) 100 UNIT/ML injection, Inject 75 Units into the skin at bedtime., Disp: , Rfl:  .  lisinopril (PRINIVIL,ZESTRIL) 20 MG tablet, Take 20 mg by mouth daily., Disp: , Rfl:  .  metFORMIN (GLUCOPHAGE) 850 MG tablet, Take 850 mg by mouth 3 (three) times daily., Disp: , Rfl:  .  nortriptyline (PAMELOR) 10 MG capsule, Take 30 mg by mouth at bedtime., Disp: , Rfl:  .  PARoxetine (PAXIL) 40 MG tablet, Take 40 mg by mouth every morning., Disp: , Rfl:  .  prazosin (MINIPRESS) 5 MG capsule, Take 5 mg by mouth 2 (two) times daily. , Disp: , Rfl:  .  rOPINIRole (REQUIP) 0.5 MG tablet, Take 0.5 mg by mouth at bedtime., Disp: , Rfl:  .  simvastatin (ZOCOR) 40 MG tablet, Take 40 mg by mouth at bedtime., Disp: , Rfl:  .  sucralfate (CARAFATE) 1 g tablet, Take 1 tablet (1 g total) by mouth 4 (four) times daily. (Patient not taking: Reported on 10/28/2016), Disp: 60 tablet, Rfl: 0 .  tamsulosin (FLOMAX) 0.4 MG CAPS capsule, Take 0.4 mg by mouth  daily., Disp: , Rfl:  .  tiZANidine (ZANAFLEX) 2 MG tablet, Take 2 mg by mouth at bedtime., Disp: , Rfl:  .  traMADol-acetaminophen (ULTRACET) 37.5-325 MG tablet, Take 1 tablet by mouth every 6 (six) hours as needed. , Disp: , Rfl:  .  vitamin B-12 (CYANOCOBALAMIN) 1000 MCG tablet, Take 1,000 mcg by mouth at bedtime., Disp: , Rfl:   Past Medical History: Past Medical History:  Diagnosis Date  . BPH (benign prostatic hyperplasia)   . Depression   . Diabetes mellitus without complication ([place])   . HLD (hyperlipidemia)   . Hypertension   . Neuropathy ([place])   . PTSD (post-traumatic stress disorder)   . Stroke [facility])     Tobacco Use: History  Smoking Status  . Never Smoker  Smokeless Tobacco  . Never Used    Labs: Recent Review Flowsheet Data    Labs for ITP Cardiac and Pulmonary Rehab Latest Ref Rng & Units 06/27/2016 06/28/2016   Cholestrol 0 - 200 mg/dL - 200   LDLCALC 0 - 99 mg/dL - UNABLE TO CALCULATE IF TRIGLYCERIDE OVER 400 mg/dL   HDL >40 mg/dL - 29(L)   Trlycerides <150 mg/dL - 515(H)   Hemoglobin A1c 4.0 - 6.0 % 13.3(H) -       Exercise Target Goals:    Exercise Program Goal:  Individual exercise prescription set with THRR, safety & activity barriers. Participant demonstrates ability to understand and report RPE using BORG scale, to self-measure pulse accurately, and to acknowledge the importance of the exercise prescription.  Exercise Prescription Goal: Starting with aerobic activity 30 plus minutes a day, 3 days per week for initial exercise prescription. Provide home exercise prescription and guidelines that participant acknowledges understanding prior to discharge.  Activity Barriers & Risk Stratification:     Activity Barriers & Cardiac Risk Stratification - 02/11/17 1536      Activity Barriers & Cardiac Risk Stratification   Activity Barriers Other (comment)   Comments R knee "neuropathy" per patient report.  States he uses a cane as a "crutch".  Has  struggled with R knee for approx 3 years.    Cardiac Risk Stratification High      6 Minute Walk:     6 Minute Walk    Row Name 02/11/17 1456 04/19/17 0847       6 Minute Walk   Phase Initial Discharge    Distance 785 feet 1120 feet    Distance % Change  - 43 %    Walk Time 5.83 minutes 6 minutes    # of Rest Breaks 1  10 sec 6    MPH 1.5 2.12    METS 1.75 2.76    RPE 13 15    VO2 Peak 6.12 9.66    Symptoms Yes (comment) No    Comments complaint of left hip pain 3/10 [name] [name] stated had to walk carefully/a little slower due to an open ulser he currently has on his toe.      Resting HR 80 bpm 70 bpm    Resting BP 122/70 168/82    Max Ex. HR 109 bpm 110 bpm    Max Ex. BP 126/64 188/80       Oxygen Initial Assessment:   Oxygen Re-Evaluation:   Oxygen Discharge (Final Oxygen Re-Evaluation):   Initial Exercise Prescription:     Initial Exercise Prescription - 02/11/17 1400      Date of Initial Exercise RX and Referring Provider   Date 02/11/17   Referring Provider Serafina Royals MD     Treadmill   MPH 1.5   Grade 0   Minutes 15   METs 1.7     NuStep   Level 1   Minutes 15   METs 1.5     Biostep-RELP   Level 1   Minutes 15   METs 1     Prescription Details   Frequency (times per week) 3   Duration Progress to 45 minutes of aerobic exercise without signs/symptoms of physical distress     Intensity   THRR 40-80% of Max Heartrate 107-135   Ratings of Perceived Exertion 11-13   Perceived Dyspnea 0-4     Progression   Progression Continue to progress workloads to maintain intensity without signs/symptoms of physical distress.     Resistance Training   Training Prescription Yes   Weight 3 lbs   Reps 10-15      Perform Capillary Blood Glucose checks as needed.  Exercise Prescription Changes:     Exercise Prescription Changes    Row Name 02/11/17 1400 02/24/17 0800 03/02/17 1500 03/16/17 1500 03/31/17 1600     Response to Exercise   Blood  Pressure (Admit) 122/70  - 142/64 142/70 126/68   Blood Pressure (Exercise) 126/64  - 138/50 168/82 136/70   Blood Pressure (Exit)  -  - 126/74 146/65  150/86   Heart Rate (Admit) 80 bpm  - 89 bpm 68 bpm 78 bpm   Heart Rate (Exercise) 109 bpm  - 90 bpm 94 bpm 95 bpm   Heart Rate (Exit) 85 bpm  - 85 bpm 82 bpm 83 bpm   Oxygen Saturation (Admit) 97 %  -  -  -  -   Oxygen Saturation (Exercise) 96 %  -  -  -  -   Rating of Perceived Exertion (Exercise) 13  - _0 Symptoms left hip pain 3/10  - none none none   Duration  -  - Continue with 45 min of aerobic exercise without signs/symptoms of physical distress. Continue with 45 min of aerobic exercise without signs/symptoms of physical distress. Continue with 45 min of aerobic exercise without signs/symptoms of physical distress.   Intensity  -  - THRR unchanged THRR unchanged THRR unchanged     Progression   Progression  -  - Continue to progress workloads to maintain intensity without signs/symptoms of physical distress. Continue to progress workloads to maintain intensity without signs/symptoms of physical distress. Continue to progress workloads to maintain intensity without signs/symptoms of physical distress.   Average METs  -  - 2.34 2.71 3.75     Resistance Training   Training Prescription  - Yes Yes Yes Yes   Weight  - 3 lbs 3 lbs 3 lbs 3 lbs   Reps  - 10-15 10-15 10-15 10-15     Interval Training   Interval Training  -  -  - No Yes   Equipment  -  -  -  - NuStep   Comments  -  -  -  - 2 min off 30 sec on     Treadmill   MPH  - 1.5 1.4 1.5  -   Grade  - 0 0 0  -   Minutes  - _1 -   METs  - 1.7 2.07 2.15  -     NuStep   Level  - _2 Minutes  - _3 METs  - 1.5 2.6 3 3.5     Biostep-RELP   Level  - 1  - 10 10   Minutes  - 15  - 15 15   METs  - 1  - 2 4     Home Exercise Plan   Plans to continue exercise at  - Home (comment)  walking Home (comment)  walking Home (comment)  walking Home  (comment)  walking   Frequency  - Add 2 additional days to program exercise sessions. Add 2 additional days to program exercise sessions. Add 2 additional days to program exercise sessions. Add 2 additional days to program exercise sessions.   Initial Home Exercises Provided  - 02/24/17 02/24/17 02/24/17 02/24/17     Exercise Review   Progression -  walk test results -  walk test results  -  -  -   Row Name 04/15/17 1200             Response to Exercise   Blood Pressure (Admit) 138/80       Blood Pressure (Exercise) 128/64       Blood Pressure (Exit) 112/64       Heart Rate (Admit) 78 bpm       Heart Rate (Exercise) 100 bpm       Heart Rate (Exit) 92 bpm  Rating of Perceived Exertion (Exercise) 13       Symptoms none       Duration Continue with 45 min of aerobic exercise without signs/symptoms of physical distress.       Intensity THRR unchanged         Progression   Progression Continue to progress workloads to maintain intensity without signs/symptoms of physical distress.       Average METs 2.6         Resistance Training   Training Prescription Yes       Weight 3 lbs       Reps 10-15         Interval Training   Interval Training Yes       Equipment NuStep       Comments 2 min off 30 sec on         NuStep   Level 6       Minutes 15       METs 3.2         Biostep-RELP   Level 6       Minutes 15       METs 2         Home Exercise Plan   Plans to continue exercise at Home (comment)  walking       Frequency Add 2 additional days to program exercise sessions.       Initial Home Exercises Provided 02/24/17          Exercise Comments:     Exercise Comments    Row Name 02/17/17 [phone] 02/24/17 0926 03/24/17 0922 04/19/17 0845 04/19/17 0854   Exercise Comments First full day of exercise!  Patient was oriented to gym and equipment including functions, settings, policies, and procedures.  Patient's individual exercise prescription and treatment plan were  reviewed.  All starting workloads were established based on the results of the 6 minute walk test done at initial orientation visit.  The plan for exercise progression was also introduced and progression will be customized based on patient's performance and goals. Reviewed METs average and discussed progression with pt today. [name] met with doctor and they do not want him flexing his foot.  He still wants to continue to exercise, so we will just take out the treadmill. Reviewed METs average and discussed progression with pt today. 6 min walk test done today. Results reviewed with patient. 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      Exercise Goals and Review:   Exercise Goals Re-Evaluation :     Exercise Goals Re-Evaluation    Row Name 02/24/17 0850 03/02/17 1553 03/16/17 1540 03/31/17 1609 04/07/17 0933     Exercise Goal Re-Evaluation   Exercise Goals Review Increase Physical Activity;Increase Strenth and Stamina Increase Physical Activity;Increase Strenth and Stamina Increase Physical Activity;Increase Strenth and Stamina Increase Physical Activity;Increase Strenth and Stamina Increase Physical Activity;Increase Strenth and Stamina   Comments Reviewed home exercise with pt today.  Pt plans to walk at home for exercise.  Reviewed THR, pulse, RPE, sign and symptoms, and when to call 911 or MD.  Also discussed weather considerations and indoor options.  Pt voiced understanding. [name] [name] has been doing well in rehab.  We reviewed home exercise last week.  On Monday, he was able to do the NuStep on level 3 for 3 minutes.  We will try to bump up his workload some tomorrow.  We  will continue to monitor his progression. [name] [name] continues to do well in rehab.  He is trying to walk more at home on his off days.  He is now doing 3 METs on the NuStep and on level 10 on the BioStep.  Over the weekend he had a foot ulcer open up.  For now, he will stay  off the treadmill.  We will continue to monitor his progression. [name] [name] is doing well in rehab.  He is currently just doing seated activities due to a sore on the bottom of his toe and his phsyician did not want him flexing his toes more than normal.  He continues to do well and has tried to do a few intervals on the NuStep.  We will continue to monitor his progression. [name] [name] continues to do well in rehab.  His sore opened up again over the weekend and he is continue to stay off on his foot.  We talked about doing more chair exercises to maintina his exercise routine.  His strength and stamina is improving and he increasing his workloads.     Expected Outcomes Short: [name] [name] will start exercising some at home.  Long: [name] [name] will become a regular exerciser at home. Short: [name] [name] will start to add exercise in at home.  Long: Continue to come to classes to work on strength and stamina. Short: [name] [name] will continue to work on his workloads on the [employer].  Long: Continue to come to classes to work on strength and stamina. Short: [name] [name] will continue to work on increasing his SPMs.  Long: Continue to work on IT sales professional. Short: [name] [name] will continue to work on his intervals.  Long: Continue to work on IT sales professional.   [place] Name 04/15/17 1204             Exercise Goal Re-Evaluation   Exercise Goals Review Increase Physical Activity;Increase Strenth and Stamina       Comments [name] [name] is nearing graduation and will be doing his post 6MWT soon (if able).  He has been feeling stronger and able to do more at home.  We will continue to work on his progression       Expected Outcomes Short: [name] [name] will do his post 6MWT.  Long: Continue to work on IT sales professional.          Discharge Exercise Prescription (Final Exercise Prescription Changes):     Exercise Prescription Changes - 04/15/17 1200      Response to Exercise   Blood Pressure (Admit) 138/80   Blood Pressure (Exercise) 128/64   Blood Pressure (Exit)  112/64   Heart Rate (Admit) 78 bpm   Heart Rate (Exercise) 100 bpm   Heart Rate (Exit) 92 bpm   Rating of Perceived Exertion (Exercise) 13   Symptoms none   Duration Continue with 45 min of aerobic exercise without signs/symptoms of physical distress.   Intensity THRR unchanged     Progression   Progression Continue to progress workloads to maintain intensity without signs/symptoms of physical distress.   Average METs 2.6     Resistance Training   Training Prescription Yes   Weight 3 lbs   Reps 10-15     Interval Training   Interval Training Yes   Equipment NuStep   Comments 2 min off 30 sec on     NuStep   Level 6   Minutes 15   METs 3.2     Biostep-RELP   Level 6   Minutes 15  METs 2     Home Exercise Plan   Plans to continue exercise at Home (comment)  walking   Frequency Add 2 additional days to program exercise sessions.   Initial Home Exercises Provided 02/24/17      Nutrition:  Target Goals: Understanding of nutrition guidelines, daily intake of sodium <1551m, cholesterol <2047m calories 30% from fat and 7% or less from saturated fats, daily to have 5 or more servings of fruits and vegetables.  Biometrics:     Pre Biometrics - 02/11/17 1504      Pre Biometrics   Height 6' (1.829 m)   Weight 238 lb 1.6 oz (108 kg)   Waist Circumference 45.5 inches   Hip Circumference 45 inches   Waist to Hip Ratio 1.01 %   BMI (Calculated) 32.4         Post Biometrics - 04/19/17 0853       Post  Biometrics   Height 6' (1.829 m)   Weight 247 lb (112 kg)   Waist Circumference 46 inches   Hip Circumference 43.25 inches   Waist to Hip Ratio 1.06 %   BMI (Calculated) 33.6      Nutrition Therapy Plan and Nutrition Goals:     Nutrition Therapy & Goals - 03/22/17 1122      Nutrition Therapy   Diet Instructed on a meal plan based on heart healthy and diabetes guidelines.   Drug/Food Interactions Statins/Certain Fruits   Protein (specify units) 8   Fiber  30 grams   Whole Grain Foods 3 servings   Saturated Fats 13 max. grams   Fruits and Vegetables 5 servings/day   Sodium 1500 grams     Personal Nutrition Goals   Nutrition Goal Since patient writes down his schedule for the day, encourage to write out simple menus especially for evening meals.   Personal Goal #2 Add fruit or yogurt to lunch meal.   Personal Goal #3 Try lower sodium products such as very low sodium tuna, frozen vegetables, no salt added canned vegetables.   Personal Goal #4 Include a protein food with all meals.   Additional Goals? Yes   Personal Goal #5 When daughter comes next week, ask her to help in preparing some meals that can be frozen.   Personal Goal #6 Balance meals with protein, starch, and non-starchy vegetables. Use food guide plate as a guide.   Comments Patient lives alone and either picks up meals or prepares very simple meals at home.  Gave simple meal ideas based on his preferences and foods available.     Intervention Plan   Intervention Prescribe, educate and counsel regarding individualized specific dietary modifications aiming towards targeted core components such as weight, hypertension, lipid management, diabetes, heart failure and other comorbidities.;Nutrition handout(s) given to patient.   Expected Outcomes Short Term Goal: Understand basic principles of dietary content, such as calories, fat, sodium, cholesterol and nutrients.;Short Term Goal: A plan has been developed with personal nutrition goals set during dietitian appointment.;Long Term Goal: Adherence to prescribed nutrition plan.      Nutrition Discharge: Rate Your Plate Scores:     Nutrition Assessments - 04/05/17 1003      MEDFICTS Scores   Pre Score 66   Post Score 99   Score Difference 33      Nutrition Goals Re-Evaluation:     Nutrition Goals Re-Evaluation    Row Name 03/17/17 0912 04/07/17 0947           Goals  Current Weight 243 lb (110.2 kg) 247 lb (112 kg)       Nutrition Goal  - Write out menus, Add fruit, Lower sodium, Add protein      Comment Appt scheduled for Monday 3/26 [name] [name] is trryig to do better with his diet. He is adding more fruit and proteins.  He is also watching his sodium intake.       Expected Outcome help with menu planning and diet Short: Continue to work on improving diet.  Long; Work on [employer] loss         Nutrition Goals Discharge (Final Nutrition Goals Re-Evaluation):     Nutrition Goals Re-Evaluation - 04/07/17 0947      Goals   Current Weight 247 lb (112 kg)   Nutrition Goal Write out menus, Add fruit, Lower sodium, Add protein   Comment [name] [name] is trryig to do better with his diet. He is adding more fruit and proteins.  He is also watching his sodium intake.    Expected Outcome Short: Continue to work on improving diet.  Long; Work on [employer] loss      Psychosocial: Target Goals: Acknowledge presence or absence of significant depression and/or stress, maximize coping skills, provide positive support system. Participant is able to verbalize types and ability to use techniques and skills needed for reducing stress and depression.   Initial Review & Psychosocial Screening:     Initial Psych Review & Screening - 02/11/17 1531      Initial Review   Current issues with Current Anxiety/Panic  PTSD from [place]? Yes   Concerns Recent loss of significant other   Comments lost his wife in August 2017     Barriers   Psychosocial barriers to participate in program The patient should benefit from training in stress management and relaxation.     Screening Interventions   Interventions Encouraged to exercise;Program counselor consult      Quality of Life Scores:      Quality of Life - 04/05/17 0959      Quality of Life Scores   Health/Function Pre 23.54 %   Health/Function Post 19.92 %   Health/Function % Change -15.38 %   Socioeconomic Pre 24.58 %   Socioeconomic Post 21.5 %    Socioeconomic % Change  -12.53 %   Psych/Spiritual Pre 29.5 %   Psych/Spiritual Post 26.36 %   Psych/Spiritual % Change -10.64 %   Family Pre 22.88 %   Family Post 20.7 %   Family % Change -9.53 %   GLOBAL Pre 24.69 %   GLOBAL Post 21.82 %   GLOBAL % Change -11.62 %      PHQ-9: Recent Review Flowsheet Data    Depression screen [facility] 2/9 04/05/2017 02/11/2017 09/17/2016   Decreased Interest 0 2 0   Down, Depressed, Hopeless 0 1 1    PHQ - 2 Score 0 3 1   Altered sleeping 1 3 -   Tired, decreased energy 2 3 -   Change in appetite 1 2 -   Feeling bad or failure about yourself  0 0 -   Trouble concentrating 0 2 -   Moving slowly or fidgety/restless 1 1 -   Suicidal thoughts 0 0 -   PHQ-9 Score 5 14 -   Difficult doing work/chores - Somewhat difficult -     Interpretation of Total Score  Total Score Depression Severity:  1-4 = Minimal depression, 5-9 =  Mild depression, 10-14 = Moderate depression, 15-19 = Moderately severe depression, 20-27 = Severe depression   Psychosocial Evaluation and Intervention:     Psychosocial Evaluation - 02/17/17 1133      Psychosocial Evaluation & Interventions   Comments Counselor met with [name] [name] [name] [name]) today for initial psychosocial evaluation.  He is a 71 year old who had triple bypass surgery this past Fall.  He lives alone subsequent to his spouse passing away 7 months ago after 67 years of marriage.  He has a son and daughter in law who live close by and several neighbors who are part of his support system.  [name] [name] reports sleeping well and having a "decent" appetite although with having a stroke last year, as well as being a diabetic - hard to find healthy options to eat.  [name] [name] reports a history of anxiety and a diagnosis of PTSD since the 1990's.  He has been on medications for this and states they work well; although he has noticed more sadness since the loss of his spouse understandably.  [name] [name] reports his health and recovering from the loss  of his spouse are his primary stressors; as well as doing his annual tax preparation.  He has goals to get his energy back and lose some weight.  counselor recommended speaking with the dietician as well as participating in the nutrition education to address his weight loss and appetite concerns.  Counselor and staff will be following with [name] [name] throughout the course of this program.     Continue Psychosocial Services  Yes      Psychosocial Re-Evaluation:     Psychosocial Re-Evaluation    Row Name 03/17/17 0930 04/07/17 0951 04/14/17 0924         Psychosocial Re-Evaluation   Current issues with  - Current Stress Concerns Current Stress Concerns;History of Depression     Comments Counselor follow up with [name] [name] today reporting he has experienced increased energy since coming into this program.  He continues to feel more sad than is typical; which increased following the death of his spouse last 09-03-23.   [name] notices his loss of motivation to do things he once enjoyed as well.   He continues to sleep well and has a good appetite.  He was excited that his daughter is coming to visit from [place] next week and she will cook for him; which his wife always did; so he has missed this.  Counselor and staff will continue to follow with [name] [name] on his mood and goals for this program.  [name] [name] is doing better.  He is going to a have a hard week next week as it will be his anniversary.  I encouraged him to call his daughter and to maintain his connections to keep his mood up.  He is still feeling sad at times, but it is improving. Counselor follow up with [name] [name] as he is scheduled to complete this program in the near future.  [name] [name] reports he saw his neurologist yesterday who made some medication changes (decreased one Rx and added another).  He sees the cardiologist today.  [name] [name] reports he continues to feel "tired" most of the time and is having difficulty catching his breath - especially when he goes up and down stairs.   [name] [name]  is also continuing to grieve the loss of his spouse and battles with loneliness often.  While speaking with [name] [name] about a follow up program to keep him involved with others; one of his fellow Cardiac Rehab patients  reported several are graduating soon and [facility].  He invited [name] [name] to join them and this was perfect timing.  He is unaware of whether his Medicare will cover [facility] membership but was planning to check in on that soon.  Counselor encouraged [name] [name] to speak with his medical providers about his Quality of Life Scores going down since he began this class, and possibly adjusting his medications to help during this difficult time grieving the loss of his spouse who passed less than a year ago.  He agreed to do so.  Counselor commended [name] for being so proactive about his health and exercising consistently and keeping his medical appointments.  Staff will continue to monitor him throughout the course of this program.       Expected Outcomes [name] [name] will continue to exercise according to his prescription.  He will continue to be monitored for any mood changes either positively or negatively.  [name] [name] will continue to work on his goals to increase his stamina and strength. Short: Make it through next week.  Long: Continue to work on Optometrist and staying with people. [name] [name] will continue to exercise with this program through completion.  [facility] with fellow patients he has met here.  [name] [name] will speak with his Dr. about his decreased quality of life scores and possibly increasing his medications for his mood currently to help cope better with all the transition/grief and loss of his spouse.       Interventions  - Encouraged to attend Cardiac Rehabilitation for the exercise;Stress management education  -     Continue Psychosocial Services   - Follow up required by staff Follow up required by staff         Psychosocial Discharge (Final Psychosocial Re-Evaluation):     Psychosocial Re-Evaluation - 04/14/17 0924      Psychosocial Re-Evaluation   Current issues with Current Stress Concerns;History of Depression   Comments Counselor follow up with [name] [name] as he is scheduled to complete this program in the near future.  [name] [name] reports he saw his neurologist yesterday who made some medication changes (decreased one Rx and added another).  He sees the cardiologist today.  [name] [name] reports he continues to feel "tired" most of the time and is having difficulty catching his breath - especially when he goes up and down stairs.   [name] [name] is also continuing to grieve the loss of his spouse and battles with loneliness often.  While speaking with [name] [name] about a follow up program to keep him involved with others; one of his fellow Cardiac Rehab patients reported several are graduating soon and plan to [facility] at 7:30 in the mornings.  He invited [name] [name] to join them and this was perfect timing.  He is unaware of whether his Medicare will cover the Y membership but was planning to check in on that soon.  Counselor encouraged [name] [name] to speak with his medical providers about his Quality of Life Scores going down since he began this class, and possibly adjusting his medications to help during this difficult time grieving the loss of his spouse who passed less than a year ago.  He agreed to do so.  Counselor commended [name] for being so proactive about his health and exercising consistently and keeping his medical appointments.  Staff will continue to monitor him throughout the course of this program.  Expected Outcomes [name] [name] will continue to exercise with this program through completion.  [facility] to [facility] about working out there with fellow patients he has met here.  [name] [name] will speak with his Dr. about his decreased quality of life scores and possibly increasing his medications for his mood currently to  help cope better with all the transition/grief and loss of his spouse.     Continue Psychosocial Services  Follow up required by staff      Vocational Rehabilitation: Provide vocational rehab assistance to qualifying candidates.   Vocational Rehab Evaluation & Intervention:     Vocational Rehab - 02/11/17 1537      Initial Vocational Rehab Evaluation & Intervention   Assessment shows need for Vocational Rehabilitation No      Education: Education Goals: Education classes will be provided on a weekly basis, covering required topics. Participant will state understanding/return demonstration of topics presented.  Learning Barriers/Preferences:     Learning Barriers/Preferences - 02/11/17 1536      Learning Barriers/Preferences   Learning Barriers None   Learning Preferences Individual Instruction      Education Topics: General Nutrition Guidelines/Fats and Fiber: -Group instruction provided by verbal, written material, models and posters to present the general guidelines for heart healthy nutrition. Gives an explanation and review of dietary fats and fiber.   Cardiac Rehab from 04/19/2017 in [facility] Cardiac and Pulmonary Rehab  Date  03/01/17  Educator  CR  Instruction Review Code  2- meets goals/outcomes      Controlling Sodium/Reading Food Labels: -Group verbal and written material supporting the discussion of sodium use in heart healthy nutrition. Review and explanation with models, verbal and written materials for utilization of the food label.   Exercise Physiology & Risk Factors: - Group verbal and written instruction with models to review the exercise physiology of the cardiovascular system and associated critical values. Details cardiovascular disease risk factors and the goals associated with each risk factor.   Cardiac Rehab from 04/19/2017 in [facility] Cardiac and Pulmonary Rehab  Date  03/15/17  Educator  [facility]  Instruction Review Code  2- meets goals/outcomes       Aerobic Exercise & Resistance Training: - Gives group verbal and written discussion on the health impact of inactivity. On the components of aerobic and resistive training programs and the benefits of this training and how to safely progress through these programs.   Cardiac Rehab from 04/19/2017 in [facility] Cardiac and Pulmonary Rehab  Date  03/17/17  Educator  [facility]  Instruction Review Code  2- meets goals/outcomes      Flexibility, Balance, General Exercise Guidelines: - Provides group verbal and written instruction on the benefits of flexibility and balance training programs. Provides general exercise guidelines with specific guidelines to those with heart or lung disease. Demonstration and skill practice provided.   Cardiac Rehab from 04/19/2017 in [facility] Cardiac and Pulmonary Rehab  Date  03/22/17  Educator  [facility]  Instruction Review Code  2- meets goals/outcomes      Stress Management: - Provides group verbal and written instruction about the health risks of elevated stress, cause of high stress, and healthy ways to reduce stress.   Depression: - Provides group verbal and written instruction on the correlation between heart/lung disease and depressed mood, treatment options, and the stigmas associated with seeking treatment.   Cardiac Rehab from 04/19/2017 in [facility] Cardiac and Pulmonary Rehab  Date  03/03/17  Educator  [facility]  Instruction Review Code  2- meets goals/outcomes      Anatomy & Physiology of the Heart: - Group verbal and written instruction and models provide basic cardiac anatomy and physiology, with the coronary electrical and arterial systems. Review of: AMI, Angina, Valve disease, Heart Failure, Cardiac Arrhythmia, Pacemakers, and the ICD.   Cardiac Rehab from 04/19/2017 in [facility] Cardiac and Pulmonary Rehab  Date  03/29/17  Educator  CE  Instruction Review Code  2- meets goals/outcomes      Cardiac Procedures: - Group verbal and written instruction and models to  describe the testing methods done to diagnose heart disease. Reviews the outcomes of the test results. Describes the treatment choices: Medical Management, Angioplasty, or Coronary Bypass Surgery.   Cardiac Rehab from 04/19/2017 in [facility] Cardiac and Pulmonary Rehab  Date  04/05/17  Educator  CE  Instruction Review Code  2- meets goals/outcomes      Cardiac Medications: - Group verbal and written instruction to review commonly prescribed medications for heart disease. Reviews the medication, class of the drug, and side effects. Includes the steps to properly store meds and maintain the prescription regimen.   Cardiac Rehab from 04/19/2017 in [facility] Cardiac and Pulmonary Rehab  Date  04/14/17 [Second Part]  Educator  SB  Instruction Review Code  2- meets goals/outcomes [part one 04/12/17]      Go Sex-Intimacy & Heart Disease, Get SMART - Goal Setting: - Group verbal and written instruction through game format to discuss heart disease and the return to sexual intimacy. Provides group verbal and written material to discuss and apply goal setting through the application of the S.M.A.R.T. Method.   Cardiac Rehab from 04/19/2017 in [facility] Cardiac and Pulmonary Rehab  Date  04/05/17  Educator  CE  Instruction Review Code  2- meets goals/outcomes      Other Matters of the Heart: - Provides group verbal, written materials and models to describe Heart Failure, Angina, Valve Disease, and Diabetes in the realm of heart disease. Includes description of the disease process and treatment options available to the cardiac patient.   Cardiac Rehab from 04/19/2017 in [facility] Cardiac and Pulmonary Rehab  Date  03/29/17  Educator  CE  Instruction Review Code  2- meets goals/outcomes      Exercise & Equipment Safety: - Individual verbal instruction and demonstration of equipment use and safety with use of the equipment.   Cardiac Rehab from 04/19/2017 in [facility] Cardiac and Pulmonary Rehab  Date  02/11/17  Educator   PS  Instruction Review Code  2- meets goals/outcomes      Infection Prevention: - Provides verbal and written material to individual with discussion of infection control including proper hand washing and proper equipment cleaning during exercise session.   Cardiac Rehab from 04/19/2017 in [facility] Cardiac and Pulmonary Rehab  Date  02/11/17  Educator  PS  Instruction Review Code  2- meets goals/outcomes      Falls Prevention: - Provides verbal and written material to individual with discussion of falls prevention and safety.   Cardiac Rehab from 04/19/2017 in [facility] Cardiac and Pulmonary Rehab  Date  02/11/17  Educator  PS  Instruction Review Code  2- meets goals/outcomes      Diabetes: - Individual verbal and written instruction to review signs/symptoms of diabetes, desired ranges of glucose level fasting, after meals and with exercise. Advice that pre and post exercise glucose checks will be done for 3 sessions at entry of program.   Cardiac Rehab from 04/19/2017 in [facility] Cardiac and  Pulmonary Rehab  Date  02/11/17  Educator  PS  Instruction Review Code  2- meets goals/outcomes       Knowledge Questionnaire Score:     Knowledge Questionnaire Score - 04/05/17 0959      Knowledge Questionnaire Score   Pre Score 15/28   Post Score 21/28      Core Components/Risk Factors/Patient Goals at Admission:     Personal Goals and Risk Factors at Admission - 02/11/17 1512      Core Components/Risk Factors/Patient Goals on Admission    Weight Management Obesity;Weight Gain;Yes   Intervention Weight Management: Develop a combined nutrition and exercise program designed to reach desired caloric intake, while maintaining appropriate intake of nutrient and fiber, sodium and fats, and appropriate energy expenditure required for the weight goal.;Weight Management: Provide education and appropriate resources to help participant work on and attain dietary goals.;Weight Management/Obesity:  Establish reasonable short term and long term weight goals.;Obesity: Provide education and appropriate resources to help participant work on and attain dietary goals.   Admit Weight 238 lb 1.6 oz (108 kg)   Goal Weight: Short Term 233 lb (105.7 kg)   Goal Weight: Long Term 200 lb (90.7 kg)   Expected Outcomes Short Term: Continue to assess and modify interventions until short term weight is achieved;Long Term: Adherence to nutrition and physical activity/exercise program aimed toward attainment of established weight goal;Weight Loss: Understanding of general recommendations for a balanced deficit meal plan, which promotes 1-2 lb weight loss per week and includes a negative energy balance of [phone] kcal/d   Sedentary Yes   Intervention Provide advice, education, support and counseling about physical activity/exercise needs.;Develop an individualized exercise prescription for aerobic and resistive training based on initial evaluation findings, risk stratification, comorbidities and participant's personal goals.   Expected Outcomes Achievement of increased cardiorespiratory fitness and enhanced flexibility, muscular endurance and strength shown through measurements of functional capacity and personal statement of participant.   Increase Strength and Stamina Yes   Intervention Provide advice, education, support and counseling about physical activity/exercise needs.;Develop an individualized exercise prescription for aerobic and resistive training based on initial evaluation findings, risk stratification, comorbidities and participant's personal goals.   Expected Outcomes Achievement of increased cardiorespiratory fitness and enhanced flexibility, muscular endurance and strength shown through measurements of functional capacity and personal statement of participant.   Improve shortness of breath with ADL's Yes   Intervention Provide education, individualized exercise plan and daily activity instruction to  help decrease symptoms of SOB with activities of daily living.   Expected Outcomes Short Term: Achieves a reduction of symptoms when performing activities of daily living.   Diabetes Yes   Intervention Provide education about signs/symptoms and action to take for hypo/hyperglycemia.;Provide education about proper nutrition, including hydration, and aerobic/resistive exercise prescription along with prescribed medications to achieve blood glucose in normal ranges: Fasting glucose 65-99 mg/dL   Expected Outcomes Short Term: Participant verbalizes understanding of the signs/symptoms and immediate care of hyper/hypoglycemia, proper foot care and importance of medication, aerobic/resistive exercise and nutrition plan for blood glucose control.;Long Term: Attainment of HbA1C < 7%.   Hypertension Yes   Intervention Provide education on lifestyle modifcations including regular physical activity/exercise, weight management, moderate sodium restriction and increased consumption of fresh fruit, vegetables, and low fat dairy, alcohol moderation, and smoking cessation.;Monitor prescription use compliance.   Expected Outcomes Short Term: Continued assessment and intervention until BP is < 140/79m HG in hypertensive participants. < 130/840mHG in hypertensive participants with diabetes, heart failure  or chronic kidney disease.;Long Term: Maintenance of blood pressure at goal levels.   Lipids Yes   Intervention Provide education and support for participant on nutrition & aerobic/resistive exercise along with prescribed medications to achieve LDL <66m, HDL >485m   Expected Outcomes Short Term: Participant states understanding of desired cholesterol values and is compliant with medications prescribed. Participant is following exercise prescription and nutrition guidelines.;Long Term: Cholesterol controlled with medications as prescribed, with individualized exercise RX and with personalized nutrition plan. Value goals:  LDL < 7017mHDL > 40 mg.      Core Components/Risk Factors/Patient Goals Review:      Goals and Risk Factor Review    Row Name 03/17/17 0904 04/07/17 0940           Core Components/Risk Factors/Patient Goals Review   Personal Goals Review Weight Management/Obesity;Hypertension;Diabetes;Improve shortness of breath with ADL's;Lipids Weight Management/Obesity;Hypertension;Diabetes;Improve shortness of breath with ADL's;Lipids      Review FreJosph [name] doing well in rehab. It has been a good outlet for him.  [name]'s weight has been creeping up as his diet is not going as well.  His blood pressures and blood sugars have been good.  His A1C is down.  He is keeping his medications on a schedule which has been very helpful for him.   He is breathing better and able to get more stuff done around the house.   FreJosph [name] doing well in rehab.  His weight was 247 lbs today.  He is watching his diet more.  Blood pressures and blood sugars have been good and he does check them at home.   He is doing better with his SOB at home, only having trouble with stairs.        Expected Outcomes Short: FreJosph Macholl refocus his diet to try to lose weight.  He has an appt to meet with the dietician on Monday.  Long: Work on risk factor moification. Short: FreJosph Macholl still work on weight loss.  Long: Continue to work on risk factor modification.         Core Components/Risk Factors/Patient Goals at Discharge (Final Review):      Goals and Risk Factor Review - 04/07/17 0940      Core Components/Risk Factors/Patient Goals Review   Personal Goals Review Weight Management/Obesity;Hypertension;Diabetes;Improve shortness of breath with ADL's;Lipids   Review FreJosph [name] doing well in rehab.  His weight was 247 lbs today.  He is watching his diet more.  Blood pressures and blood sugars have been good and he does check them at home.   He is doing better with his SOB at home, only having trouble with stairs.     Expected Outcomes Short: FreJosph Machoill still work on weight loss.  Long: Continue to work on risk factor modification.      ITP Comments:     ITP Comments    Row Name 02/11/17 1504 02/24/17 0548 03/24/17 0548 04/21/17 0546     ITP Comments Medical Review completed;  initial ITP completed;  [facility]. Continue with ITP unless directed changes per Medical Director review 30 day review. Continue with ITP unless directed changes per Medical Director review 30 day review. Continue with ITP unless directed changes per Medical Director review       Comments:

## 2017-04-21 NOTE — Progress Notes (Signed)
Daily Session Note  Patient Details  Name: [name] MRN: [id number] Date of Birth: 1945/12/30 Referring Provider:     Cardiac Rehab from 02/11/2017 in [facility] Cardiac and Pulmonary Rehab  Referring Provider  Serafina Royals MD      Encounter Date: 04/21/2017  Check In:     Session Check In - 04/21/17 0806      Check-In   Location [facility]-Cardiac & Pulmonary Rehab   Staff Present Alberteen Sam, MA, ACSM RCEP, Exercise Physiologist;Susanne Bice, RN, BSN, Lance Sell, BA, ACSM CEP, Exercise Physiologist   Supervising physician immediately available to respond to emergencies See telemetry face sheet for immediately available ER MD   Medication changes reported     No   Fall or balance concerns reported    No   Warm-up and Cool-down Performed on first and last piece of equipment   Resistance Training Performed Yes   VAD Patient? No     Pain Assessment   Currently in Pain? No/denies         History  Smoking Status  . Never Smoker  Smokeless Tobacco  . Never Used    Goals Met:  Independence with exercise equipment Exercise tolerated well No report of cardiac concerns or symptoms Strength training completed today  Goals Unmet:  Not Applicable  Comments: Pt able to follow exercise prescription today without complaint.  Will continue to monitor for progression.    Dr. Emily Filbert is Medical Director for [place] and [facility].

## 2017-04-23 ENCOUNTER — Encounter: Payer: Medicare Other | Admitting: *Deleted

## 2017-04-23 DIAGNOSIS — Z951 Presence of aortocoronary bypass graft: Secondary | ICD-10-CM

## 2017-04-23 NOTE — Progress Notes (Signed)
Daily Session Note  Patient Details  Name: [name] MRN: [id number] Date of Birth: [birth year] Referring Provider:     Cardiac Rehab from 02/11/2017 in [facility] Cardiac and Pulmonary Rehab  Referring Provider  Serafina Royals MD      Encounter Date: 04/23/2017  Check In:     Session Check In - 04/23/17 0846      Check-In   Location [facility]-Cardiac & Pulmonary Rehab   Staff Present Nyoka Cowden, RN, BSN, MA;Cabell Lazenby, RN, Levie Heritage, MA, ACSM RCEP, Exercise Physiologist   Supervising physician immediately available to respond to emergencies See telemetry face sheet for immediately available ER MD   Medication changes reported     No   Fall or balance concerns reported    No   Warm-up and Cool-down Performed on first and last piece of equipment   Resistance Training Performed Yes   VAD Patient? No     Pain Assessment   Currently in Pain? No/denies         History  Smoking Status  . Never Smoker  Smokeless Tobacco  . Never Used    Goals Met:  Proper associated with RPD/PD & O2 Sat Exercise tolerated well  Goals Unmet:  Not Applicable  Comments:     Dr. Emily Filbert is Medical Director for [place] and [facility].

## 2017-04-23 NOTE — Patient Instructions (Signed)
Discharge Progress Report  Patient Details  Name: [name] MRN: [id number] Date of Birth: 30-Aug-1946 Referring Provider:  Corey Skains, MD   Number of Visits: 36/36  Reason for Discharge:  Patient reached a stable level of exercise. Patient independent in their exercise.  Smoking History:  History  Smoking Status  . Never Smoker  Smokeless Tobacco  . Never Used    Diagnosis:  S/P CABG x 3  Initial Exercise Prescription:     Initial Exercise Prescription - 02/11/17 1400      Date of Initial Exercise RX and Referring Provider   Date 02/11/17   Referring Provider Serafina Royals MD     Treadmill   MPH 1.5   Grade 0   Minutes 15   METs 1.7     NuStep   Level 1   Minutes 15   METs 1.5     Biostep-RELP   Level 1   Minutes 15   METs 1     Prescription Details   Frequency (times per week) 3   Duration Progress to 45 minutes of aerobic exercise without signs/symptoms of physical distress     Intensity   THRR 40-80% of Max Heartrate 107-135   Ratings of Perceived Exertion 11-13   Perceived Dyspnea 0-4     Progression   Progression Continue to progress workloads to maintain intensity without signs/symptoms of physical distress.     Resistance Training   Training Prescription Yes   Weight 3 lbs   Reps 10-15      Discharge Exercise Prescription (Final Exercise Prescription Changes):     Exercise Prescription Changes - 04/15/17 1200      Response to Exercise   Blood Pressure (Admit) 138/80   Blood Pressure (Exercise) 128/64   Blood Pressure (Exit) 112/64   Heart Rate (Admit) 78 bpm   Heart Rate (Exercise) 100 bpm   Heart Rate (Exit) 92 bpm   Rating of Perceived Exertion (Exercise) 13   Symptoms none   Duration Continue with 45 min of aerobic exercise without signs/symptoms of physical distress.   Intensity THRR unchanged     Progression   Progression Continue to progress workloads to maintain intensity without signs/symptoms of  physical distress.   Average METs 2.6     Resistance Training   Training Prescription Yes   Weight 3 lbs   Reps 10-15     Interval Training   Interval Training Yes   Equipment NuStep   Comments 2 min off 30 sec on     NuStep   Level 6   Minutes 15   METs 3.2     Biostep-RELP   Level 6   Minutes 15   METs 2     Home Exercise Plan   Plans to continue exercise at Home (comment)  walking   Frequency Add 2 additional days to program exercise sessions.   Initial Home Exercises Provided 02/24/17      Functional Capacity:     6 Minute Walk    Row Name 02/11/17 1456 04/19/17 0847       6 Minute Walk   Phase Initial Discharge    Distance 785 feet 1120 feet    Distance % Change  - 43 %    Walk Time 5.83 minutes 6 minutes    # of Rest Breaks 1  10 sec 6    MPH 1.5 2.12    METS 1.75 2.76    RPE 13 15    VO2  Peak 6.12 9.66    Symptoms Yes (comment) No    Comments complaint of left hip pain 3/10 [name] stated had to walk carefully/a little slower due to an open ulser he currently has on his toe.      Resting HR 80 bpm 70 bpm    Resting BP 122/70 168/82    Max Ex. HR 109 bpm 110 bpm    Max Ex. BP 126/64 188/80       Quality of Life:     Quality of Life - 04/05/17 0959      Quality of Life Scores   Health/Function Pre 23.54 %   Health/Function Post 19.92 %   Health/Function % Change -15.38 %   Socioeconomic Pre 24.58 %   Socioeconomic Post 21.5 %   Socioeconomic % Change  -12.53 %   Psych/Spiritual Pre 29.5 %   Psych/Spiritual Post 26.36 %   Psych/Spiritual % Change -10.64 %   Family Pre 22.88 %   Family Post 20.7 %   Family % Change -9.53 %   GLOBAL Pre 24.69 %   GLOBAL Post 21.82 %   GLOBAL % Change -11.62 %      Personal Goals: Goals established at orientation with interventions provided to work toward goal.     Personal Goals and Risk Factors at Admission - 02/11/17 1512      Core Components/Risk Factors/Patient Goals on Admission    Weight  Management Obesity;Weight Gain;Yes   Intervention Weight Management: Develop a combined nutrition and exercise program designed to reach desired caloric intake, while maintaining appropriate intake of nutrient and fiber, sodium and fats, and appropriate energy expenditure required for the weight goal.;Weight Management: Provide education and appropriate resources to help participant work on and attain dietary goals.;Weight Management/Obesity: Establish reasonable short term and long term weight goals.;Obesity: Provide education and appropriate resources to help participant work on and attain dietary goals.   Admit Weight 238 lb 1.6 oz (108 kg)   Goal Weight: Short Term 233 lb (105.7 kg)   Goal Weight: Long Term 200 lb (90.7 kg)   Expected Outcomes Short Term: Continue to assess and modify interventions until short term weight is achieved;Long Term: Adherence to nutrition and physical activity/exercise program aimed toward attainment of established weight goal;Weight Loss: Understanding of general recommendations for a balanced deficit meal plan, which promotes 1-2 lb weight loss per week and includes a negative energy balance of [phone] kcal/d   Sedentary Yes   Intervention Provide advice, education, support and counseling about physical activity/exercise needs.;Develop an individualized exercise prescription for aerobic and resistive training based on initial evaluation findings, risk stratification, comorbidities and participant's personal goals.   Expected Outcomes Achievement of increased cardiorespiratory fitness and enhanced flexibility, muscular endurance and strength shown through measurements of functional capacity and personal statement of participant.   Increase Strength and Stamina Yes   Intervention Provide advice, education, support and counseling about physical activity/exercise needs.;Develop an individualized exercise prescription for aerobic and resistive training based on initial  evaluation findings, risk stratification, comorbidities and participant's personal goals.   Expected Outcomes Achievement of increased cardiorespiratory fitness and enhanced flexibility, muscular endurance and strength shown through measurements of functional capacity and personal statement of participant.   Improve shortness of breath with ADL's Yes   Intervention Provide education, individualized exercise plan and daily activity instruction to help decrease symptoms of SOB with activities of daily living.   Expected Outcomes Short Term: Achieves a reduction of symptoms when performing activities of daily  living.   Diabetes Yes   Intervention Provide education about signs/symptoms and action to take for hypo/hyperglycemia.;Provide education about proper nutrition, including hydration, and aerobic/resistive exercise prescription along with prescribed medications to achieve blood glucose in normal ranges: Fasting glucose 65-99 mg/dL   Expected Outcomes Short Term: Participant verbalizes understanding of the signs/symptoms and immediate care of hyper/hypoglycemia, proper foot care and importance of medication, aerobic/resistive exercise and nutrition plan for blood glucose control.;Long Term: Attainment of HbA1C < 7%.   Hypertension Yes   Intervention Provide education on lifestyle modifcations including regular physical activity/exercise, weight management, moderate sodium restriction and increased consumption of fresh fruit, vegetables, and low fat dairy, alcohol moderation, and smoking cessation.;Monitor prescription use compliance.   Expected Outcomes Short Term: Continued assessment and intervention until BP is < 140/53mm HG in hypertensive participants. < 130/47mm HG in hypertensive participants with diabetes, heart failure or chronic kidney disease.;Long Term: Maintenance of blood pressure at goal levels.   Lipids Yes   Intervention Provide education and support for participant on nutrition &  aerobic/resistive exercise along with prescribed medications to achieve LDL 70mg , HDL >40mg .   Expected Outcomes Short Term: Participant states understanding of desired cholesterol values and is compliant with medications prescribed. Participant is following exercise prescription and nutrition guidelines.;Long Term: Cholesterol controlled with medications as prescribed, with individualized exercise RX and with personalized nutrition plan. Value goals: LDL < 70mg , HDL > 40 mg.       Personal Goals Discharge:     Goals and Risk Factor Review - 04/07/17 0940      Core Components/Risk Factors/Patient Goals Review   Personal Goals Review Weight Management/Obesity;Hypertension;Diabetes;Improve shortness of breath with ADL's;Lipids   Review [name] is doing well in rehab.  His weight was 247 lbs today.  He is watching his diet more.  Blood pressures and blood sugars have been good and he does check them at home.   He is doing better with his SOB at home, only having trouble with stairs.     Expected Outcomes Short: [name] will still work on weight loss.  Long: Continue to work on risk factor modification.      Nutrition & Weight - Outcomes:     Pre Biometrics - 02/11/17 1504      Pre Biometrics   Height 6' (1.829 m)   Weight 238 lb 1.6 oz (108 kg)   Waist Circumference 45.5 inches   Hip Circumference 45 inches   Waist to Hip Ratio 1.01 %   BMI (Calculated) 32.4         Post Biometrics - 04/19/17 0853       Post  Biometrics   Height 6' (1.829 m)   Weight 247 lb (112 kg)   Waist Circumference 46 inches   Hip Circumference 43.25 inches   Waist to Hip Ratio 1.06 %   BMI (Calculated) 33.6      Nutrition:     Nutrition Therapy & Goals - 03/22/17 1122      Nutrition Therapy   Diet Instructed on a meal plan based on heart healthy and diabetes guidelines.   Drug/Food Interactions Statins/Certain Fruits   Protein (specify units) 8   Fiber 30 grams   Whole Grain Foods 3 servings    Saturated Fats 13 max. grams   Fruits and Vegetables 5 servings/day   Sodium 1500 grams     Personal Nutrition Goals   Nutrition Goal Since patient writes down his schedule for the day, encourage to write out simple menus  especially for evening meals.   Personal Goal #2 Add fruit or yogurt to lunch meal.   Personal Goal #3 Try lower sodium products such as very low sodium tuna, frozen vegetables, no salt added canned vegetables.   Personal Goal #4 Include a protein food with all meals.   Additional Goals? Yes   Personal Goal #5 When daughter comes next week, ask her to help in preparing some meals that can be frozen.   Personal Goal #6 Balance meals with protein, starch, and non-starchy vegetables. Use food guide plate as a guide.   Comments Patient lives alone and either picks up meals or prepares very simple meals at home.  Gave simple meal ideas based on his preferences and foods available.     Intervention Plan   Intervention Prescribe, educate and counsel regarding individualized specific dietary modifications aiming towards targeted core components such as weight, hypertension, lipid management, diabetes, heart failure and other comorbidities.;Nutrition handout(s) given to patient.   Expected Outcomes Short Term Goal: Understand basic principles of dietary content, such as calories, fat, sodium, cholesterol and nutrients.;Short Term Goal: A plan has been developed with personal nutrition goals set during dietitian appointment.;Long Term Goal: Adherence to prescribed nutrition plan.      Nutrition Discharge:     Nutrition Assessments - 04/05/17 1003      MEDFICTS Scores   Pre Score 66   Post Score 99   Score Difference 33      Education Questionnaire Score:     Knowledge Questionnaire Score - 04/05/17 0959      Knowledge Questionnaire Score   Pre Score 15/28   Post Score 21/28      Goals reviewed with patient; copy given to patient.

## 2017-04-26 ENCOUNTER — Encounter: Payer: Medicare Other | Admitting: *Deleted

## 2017-04-26 DIAGNOSIS — Z951 Presence of aortocoronary bypass graft: Secondary | ICD-10-CM | POA: Diagnosis not present

## 2017-04-26 NOTE — Progress Notes (Signed)
Daily Session Note  Patient Details  Name: [name] MRN: [id number] Date of Birth: 1946-07-17 Referring Provider:     Cardiac Rehab from 02/11/2017 in [facility]  Referring Provider  Serafina Royals MD      Encounter Date: 04/26/2017  Check In:     Session Check In - 04/26/17 0833      Check-In   Location [facility]   Staff Present Alberteen Sam, MA, ACSM RCEP, Exercise Physiologist;[name] Amedeo Plenty, BS, ACSM CEP, Exercise Physiologist;Carroll Enterkin, RN, BSN   Supervising physician immediately available to respond to emergencies See telemetry face sheet for immediately available ER MD   Medication changes reported     No   Fall or balance concerns reported    No   Warm-up and Cool-down Performed on first and last piece of equipment   Resistance Training Performed Yes   VAD Patient? No     Pain Assessment   Currently in Pain? No/denies   Multiple Pain Sites No         History  Smoking Status  . Never Smoker  Smokeless Tobacco  . Never Used    Goals Met:  Independence with exercise equipment Exercise tolerated well No report of cardiac concerns or symptoms Strength training completed today  Goals Unmet:  Not Applicable  Comments: Pt able to follow exercise prescription today without complaint.  Will continue to monitor for progression.  Loren graduated today from cardiac rehab with 36 sessions completed.  Details of the patient's exercise prescription and what He needs to do in order to continue the prescription and progress were discussed with patient.  Patient was given a copy of prescription and goals.  Patient verbalized understanding.  Dov plans to continue to exercise by going to [employer].    Dr. Emily Filbert is Medical Director for [facility].

## 2017-04-26 NOTE — Progress Notes (Signed)
Cardiac Individual Treatment Plan  Patient Details  Name: [name] [name] MRN: [id number] Date of Birth: July 01, 1946 Referring Provider:     Cardiac Rehab from 02/11/2017 in [facility] Cardiac and Pulmonary Rehab  Referring Provider  Serafina Royals MD      Initial Encounter Date:    Cardiac Rehab from 02/11/2017 in [facility] Cardiac and Pulmonary Rehab  Date  02/11/17  Referring Provider  Serafina Royals MD      Visit Diagnosis: S/P CABG x 3  Patient's Home Medications on Admission:  Current Outpatient Prescriptions:  .  aspirin 81 MG chewable tablet, Chew 1 tablet (81 mg total) by mouth before cath procedure., Disp: 90 tablet, Rfl: 4 .  cholecalciferol (VITAMIN D) 1000 UNITS tablet, Take 1,000 Units by mouth daily., Disp: , Rfl:  .  finasteride (PROSCAR) 5 MG tablet, Take 5 mg by mouth daily., Disp: , Rfl:  .  insulin aspart (NOVOLOG) 100 UNIT/ML injection, Inject 20 Units into the skin 3 (three) times daily before meals., Disp: , Rfl:  .  insulin glargine (LANTUS) 100 UNIT/ML injection, Inject 75 Units into the skin at bedtime., Disp: , Rfl:  .  lisinopril (PRINIVIL,ZESTRIL) 20 MG tablet, Take 20 mg by mouth daily., Disp: , Rfl:  .  metFORMIN (GLUCOPHAGE) 850 MG tablet, Take 850 mg by mouth 3 (three) times daily., Disp: , Rfl:  .  nortriptyline (PAMELOR) 10 MG capsule, Take 30 mg by mouth at bedtime., Disp: , Rfl:  .  PARoxetine (PAXIL) 40 MG tablet, Take 40 mg by mouth every morning., Disp: , Rfl:  .  prazosin (MINIPRESS) 5 MG capsule, Take 5 mg by mouth 2 (two) times daily. , Disp: , Rfl:  .  rOPINIRole (REQUIP) 0.5 MG tablet, Take 0.5 mg by mouth at bedtime., Disp: , Rfl:  .  simvastatin (ZOCOR) 40 MG tablet, Take 40 mg by mouth at bedtime., Disp: , Rfl:  .  sucralfate (CARAFATE) 1 g tablet, Take 1 tablet (1 g total) by mouth 4 (four) times daily. (Patient not taking: Reported on 10/28/2016), Disp: 60 tablet, Rfl: 0 .  tamsulosin (FLOMAX) 0.4 MG CAPS capsule, Take 0.4 mg by mouth  daily., Disp: , Rfl:  .  tiZANidine (ZANAFLEX) 2 MG tablet, Take 2 mg by mouth at bedtime., Disp: , Rfl:  .  traMADol-acetaminophen (ULTRACET) 37.5-325 MG tablet, Take 1 tablet by mouth every 6 (six) hours as needed. , Disp: , Rfl:  .  vitamin B-12 (CYANOCOBALAMIN) 1000 MCG tablet, Take 1,000 mcg by mouth at bedtime., Disp: , Rfl:   Past Medical History: Past Medical History:  Diagnosis Date  . BPH (benign prostatic hyperplasia)   . Depression   . Diabetes mellitus without complication ([place])   . HLD (hyperlipidemia)   . Hypertension   . Neuropathy ([place])   . PTSD (post-traumatic stress disorder)   . Stroke [facility])     Tobacco Use: History  Smoking Status  . Never Smoker  Smokeless Tobacco  . Never Used    Labs: Recent Review Flowsheet Data    Labs for ITP Cardiac and Pulmonary Rehab Latest Ref Rng & Units 06/27/2016 06/28/2016   Cholestrol 0 - 200 mg/dL - 200   LDLCALC 0 - 99 mg/dL - UNABLE TO CALCULATE IF TRIGLYCERIDE OVER 400 mg/dL   HDL >40 mg/dL - 29(L)   Trlycerides <150 mg/dL - 515(H)   Hemoglobin A1c 4.0 - 6.0 % 13.3(H) -       Exercise Target Goals:    Exercise Program Goal:  Individual exercise prescription set with THRR, safety & activity barriers. Participant demonstrates ability to understand and report RPE using BORG scale, to self-measure pulse accurately, and to acknowledge the importance of the exercise prescription.  Exercise Prescription Goal: Starting with aerobic activity 30 plus minutes a day, 3 days per week for initial exercise prescription. Provide home exercise prescription and guidelines that participant acknowledges understanding prior to discharge.  Activity Barriers & Risk Stratification:     Activity Barriers & Cardiac Risk Stratification - 02/11/17 1536      Activity Barriers & Cardiac Risk Stratification   Activity Barriers Other (comment)   Comments R knee "neuropathy" per patient report.  States he uses a cane as a "crutch".  Has  struggled with R knee for approx 3 years.    Cardiac Risk Stratification High      6 Minute Walk:     6 Minute Walk    Row Name 02/11/17 1456 04/19/17 0847       6 Minute Walk   Phase Initial Discharge    Distance 785 feet 1120 feet    Distance % Change  - 43 %    Walk Time 5.83 minutes 6 minutes    # of Rest Breaks 1  10 sec 6    MPH 1.5 2.12    METS 1.75 2.76    RPE 13 15    VO2 Peak 6.12 9.66    Symptoms Yes (comment) No    Comments complaint of left hip pain 3/10 [name] [name] stated had to walk carefully/a little slower due to an open ulser he currently has on his toe.      Resting HR 80 bpm 70 bpm    Resting BP 122/70 168/82    Max Ex. HR 109 bpm 110 bpm    Max Ex. BP 126/64 188/80       Oxygen Initial Assessment:   Oxygen Re-Evaluation:   Oxygen Discharge (Final Oxygen Re-Evaluation):   Initial Exercise Prescription:     Initial Exercise Prescription - 02/11/17 1400      Date of Initial Exercise RX and Referring Provider   Date 02/11/17   Referring Provider Serafina Royals MD     Treadmill   MPH 1.5   Grade 0   Minutes 15   METs 1.7     NuStep   Level 1   Minutes 15   METs 1.5     Biostep-RELP   Level 1   Minutes 15   METs 1     Prescription Details   Frequency (times per week) 3   Duration Progress to 45 minutes of aerobic exercise without signs/symptoms of physical distress     Intensity   THRR 40-80% of Max Heartrate 107-135   Ratings of Perceived Exertion 11-13   Perceived Dyspnea 0-4     Progression   Progression Continue to progress workloads to maintain intensity without signs/symptoms of physical distress.     Resistance Training   Training Prescription Yes   Weight 3 lbs   Reps 10-15      Perform Capillary Blood Glucose checks as needed.  Exercise Prescription Changes:     Exercise Prescription Changes    Row Name 02/11/17 1400 02/24/17 0800 03/02/17 1500 03/16/17 1500 03/31/17 1600     Response to Exercise   Blood  Pressure (Admit) 122/70  - 142/64 142/70 126/68   Blood Pressure (Exercise) 126/64  - 138/50 168/82 136/70   Blood Pressure (Exit)  -  - 126/74 146/65  150/86   Heart Rate (Admit) 80 bpm  - 89 bpm 68 bpm 78 bpm   Heart Rate (Exercise) 109 bpm  - 90 bpm 94 bpm 95 bpm   Heart Rate (Exit) 85 bpm  - 85 bpm 82 bpm 83 bpm   Oxygen Saturation (Admit) 97 %  -  -  -  -   Oxygen Saturation (Exercise) 96 %  -  -  -  -   Rating of Perceived Exertion (Exercise) 13  - _0 Symptoms left hip pain 3/10  - none none none   Duration  -  - Continue with 45 min of aerobic exercise without signs/symptoms of physical distress. Continue with 45 min of aerobic exercise without signs/symptoms of physical distress. Continue with 45 min of aerobic exercise without signs/symptoms of physical distress.   Intensity  -  - THRR unchanged THRR unchanged THRR unchanged     Progression   Progression  -  - Continue to progress workloads to maintain intensity without signs/symptoms of physical distress. Continue to progress workloads to maintain intensity without signs/symptoms of physical distress. Continue to progress workloads to maintain intensity without signs/symptoms of physical distress.   Average METs  -  - 2.34 2.71 3.75     Resistance Training   Training Prescription  - Yes Yes Yes Yes   Weight  - 3 lbs 3 lbs 3 lbs 3 lbs   Reps  - 10-15 10-15 10-15 10-15     Interval Training   Interval Training  -  -  - No Yes   Equipment  -  -  -  - NuStep   Comments  -  -  -  - 2 min off 30 sec on     Treadmill   MPH  - 1.5 1.4 1.5  -   Grade  - 0 0 0  -   Minutes  - _1 -   METs  - 1.7 2.07 2.15  -     NuStep   Level  - _2 Minutes  - _3 METs  - 1.5 2.6 3 3.5     Biostep-RELP   Level  - 1  - 10 10   Minutes  - 15  - 15 15   METs  - 1  - 2 4     Home Exercise Plan   Plans to continue exercise at  - Home (comment)  walking Home (comment)  walking Home (comment)  walking Home  (comment)  walking   Frequency  - Add 2 additional days to program exercise sessions. Add 2 additional days to program exercise sessions. Add 2 additional days to program exercise sessions. Add 2 additional days to program exercise sessions.   Initial Home Exercises Provided  - 02/24/17 02/24/17 02/24/17 02/24/17     Exercise Review   Progression -  walk test results -  walk test results  -  -  -   Row Name 04/15/17 1200             Response to Exercise   Blood Pressure (Admit) 138/80       Blood Pressure (Exercise) 128/64       Blood Pressure (Exit) 112/64       Heart Rate (Admit) 78 bpm       Heart Rate (Exercise) 100 bpm       Heart Rate (Exit) 92 bpm  Rating of Perceived Exertion (Exercise) 13       Symptoms none       Duration Continue with 45 min of aerobic exercise without signs/symptoms of physical distress.       Intensity THRR unchanged         Progression   Progression Continue to progress workloads to maintain intensity without signs/symptoms of physical distress.       Average METs 2.6         Resistance Training   Training Prescription Yes       Weight 3 lbs       Reps 10-15         Interval Training   Interval Training Yes       Equipment NuStep       Comments 2 min off 30 sec on         NuStep   Level 6       Minutes 15       METs 3.2         Biostep-RELP   Level 6       Minutes 15       METs 2         Home Exercise Plan   Plans to continue exercise at Home (comment)  walking       Frequency Add 2 additional days to program exercise sessions.       Initial Home Exercises Provided 02/24/17          Exercise Comments:     Exercise Comments    Row Name 02/17/17 [phone] 02/24/17 0926 03/24/17 0922 04/19/17 0845 04/19/17 0854   Exercise Comments First full day of exercise!  Patient was oriented to gym and equipment including functions, settings, policies, and procedures.  Patient's individual exercise prescription and treatment plan were  reviewed.  All starting workloads were established based on the results of the 6 minute walk test done at initial orientation visit.  The plan for exercise progression was also introduced and progression will be customized based on patient's performance and goals. Reviewed METs average and discussed progression with pt today. [name] met with doctor and they do not want him flexing his foot.  He still wants to continue to exercise, so we will just take out the treadmill. Reviewed METs average and discussed progression with pt today. 6 min walk test done today. Results reviewed with patient. 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   [place] Name 04/26/17 1191           Exercise Comments  Cabe graduated today from cardiac rehab with 36 sessions completed.  Details of the patient's exercise prescription and what He needs to do in order to continue the prescription and progress were discussed with patient.  Patient was given a copy of prescription and goals.  Patient verbalized understanding.  Drevion plans to continue to exercise by going to [employer].          Exercise Goals and Review:   Exercise Goals Re-Evaluation :     Exercise Goals Re-Evaluation    Row Name 02/24/17 0850 03/02/17 1553 03/16/17 1540 03/31/17 1609 04/07/17 0933     Exercise Goal Re-Evaluation   Exercise Goals Review Increase Physical Activity;Increase Strenth and Stamina Increase Physical Activity;Increase Strenth and Stamina Increase Physical Activity;Increase Strenth and Stamina Increase Physical Activity;Increase Strenth and Stamina Increase Physical Activity;Increase Strenth and Stamina   Comments Reviewed  home exercise with pt today.  Pt plans to walk at home for exercise.  Reviewed THR, pulse, RPE, sign and symptoms, and when to call 911 or MD.  Also discussed weather considerations and indoor options.  Pt voiced understanding. [name] [name] has been doing  well in rehab.  We reviewed home exercise last week.  On Monday, he was able to do the NuStep on level 3 for 3 minutes.  We will try to bump up his workload some tomorrow.  We will continue to monitor his progression. [name] [name] continues to do well in rehab.  He is trying to walk more at home on his off days.  He is now doing 3 METs on the NuStep and on level 10 on the BioStep.  Over the weekend he had a foot ulcer open up.  For now, he will stay off the treadmill.  We will continue to monitor his progression. [name] [name] is doing well in rehab.  He is currently just doing seated activities due to a sore on the bottom of his toe and his phsyician did not want him flexing his toes more than normal.  He continues to do well and has tried to do a few intervals on the NuStep.  We will continue to monitor his progression. [name] [name] continues to do well in rehab.  His sore opened up again over the weekend and he is continue to stay off on his foot.  We talked about doing more chair exercises to maintina his exercise routine.  His strength and stamina is improving and he increasing his workloads.     Expected Outcomes Short: [name] [name] will start exercising some at home.  Long: [name] [name] will become a regular exerciser at home. Short: [name] [name] will start to add exercise in at home.  Long: Continue to come to classes to work on strength and stamina. Short: [name] [name] will continue to work on his workloads on the [employer].  Long: Continue to come to classes to work on strength and stamina. Short: [name] [name] will continue to work on increasing his SPMs.  Long: Continue to work on IT sales professional. Short: [name] [name] will continue to work on his intervals.  Long: Continue to work on IT sales professional.   [place] Name 04/15/17 1204             Exercise Goal Re-Evaluation   Exercise Goals Review Increase Physical Activity;Increase Strenth and Stamina       Comments [name] [name] is nearing graduation and will be doing his post 6MWT soon (if able).  He has been feeling stronger  and able to do more at home.  We will continue to work on his progression       Expected Outcomes Short: [name] [name] will do his post 6MWT.  Long: Continue to work on IT sales professional.          Discharge Exercise Prescription (Final Exercise Prescription Changes):     Exercise Prescription Changes - 04/15/17 1200      Response to Exercise   Blood Pressure (Admit) 138/80   Blood Pressure (Exercise) 128/64   Blood Pressure (Exit) 112/64   Heart Rate (Admit) 78 bpm   Heart Rate (Exercise) 100 bpm   Heart Rate (Exit) 92 bpm   Rating of Perceived Exertion (Exercise) 13   Symptoms none   Duration Continue with 45 min of aerobic exercise without signs/symptoms of physical distress.   Intensity THRR unchanged     Progression   Progression Continue to progress workloads to maintain intensity without signs/symptoms  of physical distress.   Average METs 2.6     Resistance Training   Training Prescription Yes   Weight 3 lbs   Reps 10-15     Interval Training   Interval Training Yes   Equipment NuStep   Comments 2 min off 30 sec on     NuStep   Level 6   Minutes 15   METs 3.2     Biostep-RELP   Level 6   Minutes 15   METs 2     Home Exercise Plan   Plans to continue exercise at Home (comment)  walking   Frequency Add 2 additional days to program exercise sessions.   Initial Home Exercises Provided 02/24/17      Nutrition:  Target Goals: Understanding of nutrition guidelines, daily intake of sodium '1500mg'$ , cholesterol '200mg'$ , calories 30% from fat and 7% or less from saturated fats, daily to have 5 or more servings of fruits and vegetables.  Biometrics:     Pre Biometrics - 02/11/17 1504      Pre Biometrics   Height 6' (1.829 m)   Weight 238 lb 1.6 oz (108 kg)   Waist Circumference 45.5 inches   Hip Circumference 45 inches   Waist to Hip Ratio 1.01 %   BMI (Calculated) 32.4         Post Biometrics - 04/19/17 0853       Post  Biometrics   Height 6' (1.829 m)    Weight 247 lb (112 kg)   Waist Circumference 46 inches   Hip Circumference 43.25 inches   Waist to Hip Ratio 1.06 %   BMI (Calculated) 33.6      Nutrition Therapy Plan and Nutrition Goals:     Nutrition Therapy & Goals - 03/22/17 1122      Nutrition Therapy   Diet Instructed on a meal plan based on heart healthy and diabetes guidelines.   Drug/Food Interactions Statins/Certain Fruits   Protein (specify units) 8   Fiber 30 grams   Whole Grain Foods 3 servings   Saturated Fats 13 max. grams   Fruits and Vegetables 5 servings/day   Sodium 1500 grams     Personal Nutrition Goals   Nutrition Goal Since patient writes down his schedule for the day, encourage to write out simple menus especially for evening meals.   Personal Goal #2 Add fruit or yogurt to lunch meal.   Personal Goal #3 Try lower sodium products such as very low sodium tuna, frozen vegetables, no salt added canned vegetables.   Personal Goal #4 Include a protein food with all meals.   Additional Goals? Yes   Personal Goal #5 When daughter comes next week, ask her to help in preparing some meals that can be frozen.   Personal Goal #6 Balance meals with protein, starch, and non-starchy vegetables. Use food guide plate as a guide.   Comments Patient lives alone and either picks up meals or prepares very simple meals at home.  Gave simple meal ideas based on his preferences and foods available.     Intervention Plan   Intervention Prescribe, educate and counsel regarding individualized specific dietary modifications aiming towards targeted core components such as weight, hypertension, lipid management, diabetes, heart failure and other comorbidities.;Nutrition handout(s) given to patient.   Expected Outcomes Short Term Goal: Understand basic principles of dietary content, such as calories, fat, sodium, cholesterol and nutrients.;Short Term Goal: A plan has been developed with personal nutrition goals set during dietitian  appointment.;Long Term  Goal: Adherence to prescribed nutrition plan.      Nutrition Discharge: Rate Your Plate Scores:     Nutrition Assessments - 04/05/17 1003      MEDFICTS Scores   Pre Score 66   Post Score 99   Score Difference 33      Nutrition Goals Re-Evaluation:     Nutrition Goals Re-Evaluation    Row Name 03/17/17 0912 04/07/17 0947           Goals   Current Weight 243 lb (110.2 kg) 247 lb (112 kg)      Nutrition Goal  - Write out menus, Add fruit, Lower sodium, Add protein      Comment Appt scheduled for Monday 3/26 [name] [name] is trryig to do better with his diet. He is adding more fruit and proteins.  He is also watching his sodium intake.       Expected Outcome help with menu planning and diet Short: Continue to work on improving diet.  Long; Work on [employer] loss         Nutrition Goals Discharge (Final Nutrition Goals Re-Evaluation):     Nutrition Goals Re-Evaluation - 04/07/17 0947      Goals   Current Weight 247 lb (112 kg)   Nutrition Goal Write out menus, Add fruit, Lower sodium, Add protein   Comment [name] [name] is trryig to do better with his diet. He is adding more fruit and proteins.  He is also watching his sodium intake.    Expected Outcome Short: Continue to work on improving diet.  Long; Work on [employer] loss      Psychosocial: Target Goals: Acknowledge presence or absence of significant depression and/or stress, maximize coping skills, provide positive support system. Participant is able to verbalize types and ability to use techniques and skills needed for reducing stress and depression.   Initial Review & Psychosocial Screening:     Initial Psych Review & Screening - 02/11/17 1531      Initial Review   Current issues with Current Anxiety/Panic  PTSD from [place]? Yes   Concerns Recent loss of significant other   Comments lost his wife in August 2017     Barriers   Psychosocial barriers to participate in  program The patient should benefit from training in stress management and relaxation.     Screening Interventions   Interventions Encouraged to exercise;Program counselor consult      Quality of Life Scores:      Quality of Life - 04/05/17 0959      Quality of Life Scores   Health/Function Pre 23.54 %   Health/Function Post 19.92 %   Health/Function % Change -15.38 %   Socioeconomic Pre 24.58 %   Socioeconomic Post 21.5 %   Socioeconomic % Change  -12.53 %   Psych/Spiritual Pre 29.5 %   Psych/Spiritual Post 26.36 %   Psych/Spiritual % Change -10.64 %   Family Pre 22.88 %   Family Post 20.7 %   Family % Change -9.53 %   GLOBAL Pre 24.69 %   GLOBAL Post 21.82 %   GLOBAL % Change -11.62 %      PHQ-9: Recent Review Flowsheet Data    Depression screen [facility] 2/9 04/05/2017 02/11/2017 09/17/2016   Decreased Interest 0 2 0   Down, Depressed, Hopeless 0 1 1    PHQ - 2 Score 0 3 1   Altered sleeping 1 3 -   Tired,  decreased energy 2 3 -   Change in appetite 1 2 -   Feeling bad or failure about yourself  0 0 -   Trouble concentrating 0 2 -   Moving slowly or fidgety/restless 1 1 -   Suicidal thoughts 0 0 -   PHQ-9 Score 5 14 -   Difficult doing work/chores - Somewhat difficult -     Interpretation of Total Score  Total Score Depression Severity:  1-4 = Minimal depression, 5-9 = Mild depression, 10-14 = Moderate depression, 15-19 = Moderately severe depression, 20-27 = Severe depression   Psychosocial Evaluation and Intervention:     Psychosocial Evaluation - 02/17/17 1133      Psychosocial Evaluation & Interventions   Comments Counselor met with Mr. Gascoigne [name] [name]) today for initial psychosocial evaluation.  He is a 71 year old who had triple bypass surgery this past Fall.  He lives alone subsequent to his spouse passing away 7 months ago after 82 years of marriage.  He has a son and daughter in law who live close by and several neighbors who are part of his support system.   [name] [name] reports sleeping well and having a "decent" appetite although with having a stroke last year, as well as being a diabetic - hard to find healthy options to eat.  [name] [name] reports a history of anxiety and a diagnosis of PTSD since the 1990's.  He has been on medications for this and states they work well; although he has noticed more sadness since the loss of his spouse understandably.  [name] [name] reports his health and recovering from the loss of his spouse are his primary stressors; as well as doing his annual tax preparation.  He has goals to get his energy back and lose some weight.  counselor recommended speaking with the dietician as well as participating in the nutrition education to address his weight loss and appetite concerns.  Counselor and staff will be following with [name] [name] throughout the course of this program.     Continue Psychosocial Services  Yes      Psychosocial Re-Evaluation:     Psychosocial Re-Evaluation    Row Name 03/17/17 0930 04/07/17 0951 04/14/17 0924         Psychosocial Re-Evaluation   Current issues with  - Current Stress Concerns Current Stress Concerns;History of Depression     Comments Counselor follow up with [name] [name] today reporting he has experienced increased energy since coming into this program.  He continues to feel more sad than is typical; which increased following the death of his spouse last 08/30/23.   [name] notices his loss of motivation to do things he once enjoyed as well.   He continues to sleep well and has a good appetite.  He was excited that his daughter is coming to visit from [place] next week and she will cook for him; which his wife always did; so he has missed this.  Counselor and staff will continue to follow with [name] [name] on his mood and goals for this program.  [name] [name] is doing better.  He is going to a have a hard week next week as it will be his anniversary.  I encouraged him to call his daughter and to maintain his connections to keep his mood up.  He is still  feeling sad at times, but it is improving. Counselor follow up with [name] [name] as he is scheduled to complete this program in the near future.  [name] [name] reports he saw his neurologist yesterday who made  some medication changes (decreased one Rx and added another).  He sees the cardiologist today.  [name] [name] reports he continues to feel "tired" most of the time and is having difficulty catching his breath - especially when he goes up and down stairs.   [name] [name] is also continuing to grieve the loss of his spouse and battles with loneliness often.  While speaking with [name] [name] about a follow up program to keep him involved with others; one of his fellow Cardiac Rehab patients reported several are graduating soon and [facility] in [facility].  He invited [name] [name] to join them and this was perfect timing.  He is unaware of whether his Medicare will cover the Y membership but was planning to check in on that soon.  Counselor encouraged [name] [name] to speak with his medical providers about his Quality of Life Scores going down since he began this class, and possibly adjusting his medications to help during this difficult time grieving the loss of his spouse who passed less than a year ago.  He agreed to do so.  Counselor commended [name] for being so proactive about his health and exercising consistently and keeping his medical appointments.  Staff will continue to monitor him throughout the course of this program.       Expected Outcomes [name] [name] will continue to exercise according to his prescription.  He will continue to be monitored for any mood changes either positively or negatively.  [name] [name] will continue to work on his goals to increase his stamina and strength. Short: Make it through next week.  Long: Continue to work on Opt[employer] and staying with people. [name] [name] will continue to exercise with this program through completion.  [facility] will [facility] to inquire about working out there with fellow patients he has met  here.  [name] [name] will speak with his Dr. about his decreased quality of life scores and possibly increasing his medications for his mood currently to help cope better with all the transition/grief and loss of his spouse.       Interventions  - Encouraged to attend Cardiac Rehabilitation for the exercise;Stress management education  -     Continue Psychosocial Services   - Follow up required by staff Follow up required by staff        Psychosocial Discharge (Final Psychosocial Re-Evaluation):     Psychosocial Re-Evaluation - 04/14/17 0924      Psychosocial Re-Evaluation   Current issues with Current Stress Concerns;History of Depression   Comments Counselor follow up with [name] [name] as he is scheduled to complete this program in the near future.  [name] [name] reports he saw his neurologist yesterday who made some medication changes (decreased one Rx and added another).  He sees the cardiologist today.  [name] [name] reports he continues to feel "tired" most of the time and is having difficulty catching his breath - especially when he goes up and down stairs.   [name] [name] is also continuing to grieve the loss of his spouse and battles with loneliness often.  While speaking with [name] [name] about a follow up program to keep him involved with others; one of his fellow Cardiac Rehab patients reported several are graduating soon and plan to [facility] at 7[facility]30 in the mornings.  He invited [name] [name] to join them and this was perfect timing.  He is unaware of whether his Medicare will cover the Y membership but was planning to check in on that soon.  Counselor encouraged [name] [name] to speak with his medical providers about his Quality of Life Scores going down since he began this class, and possibly adjusting his medications to help during this difficult time grieving the loss of his spouse who passed less than a year ago.  He agreed to do so.  Counselor commended [name] for being so proactive about his health and exercising consistently and keeping his  medical appointments.  Staff will continue to monitor him throughout the course of this program.     Expected Outcomes [name] [name] will continue to exercise with this program through completion.  [facility] will [facility] to [facility] about working out there with fellow patients he has met here.  [name] [name] will speak with his Dr. about his decreased quality of life scores and possibly increasing his medications for his mood currently to help cope better with all the transition/grief and loss of his spouse.     Continue Psychosocial Services  Follow up required by staff      Vocational Rehabilitation: Provide vocational rehab assistance to qualifying candidates.   Vocational Rehab Evaluation & Intervention:     Vocational Rehab - 02/11/17 1537      Initial Vocational Rehab Evaluation & Intervention   Assessment shows need for Vocational Rehabilitation No      Education: Education Goals: Education classes will be provided on a weekly basis, covering required topics. Participant will state understanding/return demonstration of topics presented.  Learning Barriers/Preferences:     Learning Barriers/Preferences - 02/11/17 1536      Learning Barriers/Preferences   Learning Barriers None   Learning Preferences Individual Instruction      Education Topics: General Nutrition Guidelines/Fats and Fiber: -Group instruction provided by verbal, written material, models and posters to present the general guidelines for heart healthy nutrition. Gives an explanation and review of dietary fats and fiber.   Cardiac Rehab from 04/26/2017 in [facility] Cardiac and Pulmonary Rehab  Date  04/26/17  Educator  CR  Instruction Review Code  2- meets goals/outcomes      Controlling Sodium/Reading Food Labels: -Group verbal and written material supporting the discussion of sodium use in heart healthy nutrition. Review and explanation with models, verbal and written materials for utilization of the food  label.   Exercise Physiology & Risk Factors: - Group verbal and written instruction with models to review the exercise physiology of the cardiovascular system and associated critical values. Details cardiovascular disease risk factors and the goals associated with each risk factor.   Cardiac Rehab from 04/26/2017 in [facility] Cardiac and Pulmonary Rehab  Date  03/15/17  Educator  [facility]  Instruction Review Code  2- meets goals/outcomes      Aerobic Exercise & Resistance Training: - Gives group verbal and written discussion on the health impact of inactivity. On the components of aerobic and resistive training programs and the benefits of this training and how to safely progress through these programs.   Cardiac Rehab from 04/26/2017 in [facility] Cardiac and Pulmonary Rehab  Date  03/17/17  Educator  [facility]  Instruction Review Code  2- meets goals/outcomes      Flexibility, Balance, General Exercise Guidelines: - Provides group verbal and written instruction on the benefits of flexibility and balance training programs. Provides general exercise guidelines with specific guidelines to those with heart or lung disease. Demonstration and skill practice provided.   Cardiac Rehab from 04/26/2017 in [facility] Cardiac and Pulmonary Rehab  Date  03/22/17  Educator  [facility]  Instruction Review Code  2- meets goals/outcomes      Stress Management: - Provides group verbal and written instruction about the health risks of elevated stress, cause of high stress, and healthy ways to reduce stress.   Depression: - Provides group verbal and written instruction on the correlation between heart/lung disease and depressed mood, treatment options, and the stigmas associated with seeking treatment.   Cardiac Rehab from 04/26/2017 in [facility] Cardiac and Pulmonary Rehab  Date  03/03/17  Educator  [facility]  Instruction Review Code  2- meets goals/outcomes      Anatomy & Physiology of the Heart: - Group verbal and written instruction and  models provide basic cardiac anatomy and physiology, with the coronary electrical and arterial systems. Review of: AMI, Angina, Valve disease, Heart Failure, Cardiac Arrhythmia, Pacemakers, and the ICD.   Cardiac Rehab from 04/26/2017 in [facility] Cardiac and Pulmonary Rehab  Date  03/29/17  Educator  CE  Instruction Review Code  2- meets goals/outcomes      Cardiac Procedures: - Group verbal and written instruction and models to describe the testing methods done to diagnose heart disease. Reviews the outcomes of the test results. Describes the treatment choices: Medical Management, Angioplasty, or Coronary Bypass Surgery.   Cardiac Rehab from 04/26/2017 in [facility] Cardiac and Pulmonary Rehab  Date  04/05/17  Educator  CE  Instruction Review Code  2- meets goals/outcomes      Cardiac Medications: - Group verbal and written instruction to review commonly prescribed medications for heart disease. Reviews the medication, class of the drug, and side effects. Includes the steps to properly store meds and maintain the prescription regimen.   Cardiac Rehab from 04/26/2017 in [facility] Cardiac and Pulmonary Rehab  Date  04/14/17 [Second Part]  Educator  SB  Instruction Review Code  2- meets goals/outcomes [part one 04/12/17]      Go Sex-Intimacy & Heart Disease, Get SMART - Goal Setting: - Group verbal and written instruction through game format to discuss heart disease and the return to sexual intimacy. Provides group verbal and written material to discuss and apply goal setting through the application of the S.M.A.R.T. Method.   Cardiac Rehab from 04/26/2017 in [facility] Cardiac and Pulmonary Rehab  Date  04/05/17  Educator  CE  Instruction Review Code  2- meets goals/outcomes      Other Matters of the Heart: - Provides group verbal, written materials and models to describe Heart Failure, Angina, Valve Disease, and Diabetes in the realm of heart disease. Includes description of the disease process and  treatment options available to the cardiac patient.   Cardiac Rehab from 04/26/2017 in [facility] Cardiac and Pulmonary Rehab  Date  03/29/17  Educator  CE  Instruction Review Code  2- meets goals/outcomes      Exercise & Equipment Safety: - Individual verbal instruction and demonstration of equipment use and safety with use of the equipment.   Cardiac Rehab from 04/26/2017 in [facility] Cardiac and Pulmonary Rehab  Date  02/11/17  Educator  PS  Instruction Review Code  2- meets goals/outcomes      Infection Prevention: - Provides verbal and written material to individual with discussion of infection control including proper hand washing and proper equipment cleaning during exercise session.   Cardiac Rehab from 04/26/2017 in [facility] Cardiac and Pulmonary Rehab  Date  02/11/17  Educator  PS  Instruction Review Code  2- meets goals/outcomes      Falls Prevention: - Provides verbal and written material to individual with discussion of falls  prevention and safety.   Cardiac Rehab from 04/26/2017 in [facility] Cardiac and Pulmonary Rehab  Date  02/11/17  Educator  PS  Instruction Review Code  2- meets goals/outcomes      Diabetes: - Individual verbal and written instruction to review signs/symptoms of diabetes, desired ranges of glucose level fasting, after meals and with exercise. Advice that pre and post exercise glucose checks will be done for 3 sessions at entry of program.   Cardiac Rehab from 04/26/2017 in [facility] Cardiac and Pulmonary Rehab  Date  02/11/17  Educator  PS  Instruction Review Code  2- meets goals/outcomes       Knowledge Questionnaire Score:     Knowledge Questionnaire Score - 04/05/17 0959      Knowledge Questionnaire Score   Pre Score 15/28   Post Score 21/28      Core Components/Risk Factors/Patient Goals at Admission:     Personal Goals and Risk Factors at Admission - 02/11/17 1512      Core Components/Risk Factors/Patient Goals on Admission    Weight Management  Obesity;Weight Gain;Yes   Intervention Weight Management: Develop a combined nutrition and exercise program designed to reach desired caloric intake, while maintaining appropriate intake of nutrient and fiber, sodium and fats, and appropriate energy expenditure required for the weight goal.;Weight Management: Provide education and appropriate resources to help participant work on and attain dietary goals.;Weight Management/Obesity: Establish reasonable short term and long term weight goals.;Obesity: Provide education and appropriate resources to help participant work on and attain dietary goals.   Admit Weight 238 lb 1.6 oz (108 kg)   Goal Weight: Short Term 233 lb (105.7 kg)   Goal Weight: Long Term 200 lb (90.7 kg)   Expected Outcomes Short Term: Continue to assess and modify interventions until short term weight is achieved;Long Term: Adherence to nutrition and physical activity/exercise program aimed toward attainment of established weight goal;Weight Loss: Understanding of general recommendations for a balanced deficit meal plan, which promotes 1-2 lb weight loss per week and includes a negative energy balance of [id number] kcal/d   Sedentary Yes   Intervention Provide advice, education, support and counseling about physical activity/exercise needs.;Develop an individualized exercise prescription for aerobic and resistive training based on initial evaluation findings, risk stratification, comorbidities and participant's personal goals.   Expected Outcomes Achievement of increased cardiorespiratory fitness and enhanced flexibility, muscular endurance and strength shown through measurements of functional capacity and personal statement of participant.   Increase Strength and Stamina Yes   Intervention Provide advice, education, support and counseling about physical activity/exercise needs.;Develop an individualized exercise prescription for aerobic and resistive training based on initial evaluation  findings, risk stratification, comorbidities and participant's personal goals.   Expected Outcomes Achievement of increased cardiorespiratory fitness and enhanced flexibility, muscular endurance and strength shown through measurements of functional capacity and personal statement of participant.   Improve shortness of breath with ADL's Yes   Intervention Provide education, individualized exercise plan and daily activity instruction to help decrease symptoms of SOB with activities of daily living.   Expected Outcomes Short Term: Achieves a reduction of symptoms when performing activities of daily living.   Diabetes Yes   Intervention Provide education about signs/symptoms and action to take for hypo/hyperglycemia.;Provide education about proper nutrition, including hydration, and aerobic/resistive exercise prescription along with prescribed medications to achieve blood glucose in normal ranges: Fasting glucose 65-99 mg/dL   Expected Outcomes Short Term: Participant verbalizes understanding of the signs/symptoms and immediate care of hyper/hypoglycemia, proper foot care and  importance of medication, aerobic/resistive exercise and nutrition plan for blood glucose control.;Long Term: Attainment of HbA1C < 7%.   Hypertension Yes   Intervention Provide education on lifestyle modifcations including regular physical activity/exercise, weight management, moderate sodium restriction and increased consumption of fresh fruit, vegetables, and low fat dairy, alcohol moderation, and smoking cessation.;Monitor prescription use compliance.   Expected Outcomes Short Term: Continued assessment and intervention until BP is < 140/44m HG in hypertensive participants. < 130/843mHG in hypertensive participants with diabetes, heart failure or chronic kidney disease.;Long Term: Maintenance of blood pressure at goal levels.   Lipids Yes   Intervention Provide education and support for participant on nutrition & aerobic/resistive  exercise along with prescribed medications to achieve LDL '70mg'$ , HDL >'40mg'$ .   Expected Outcomes Short Term: Participant states understanding of desired cholesterol values and is compliant with medications prescribed. Participant is following exercise prescription and nutrition guidelines.;Long Term: Cholesterol controlled with medications as prescribed, with individualized exercise RX and with personalized nutrition plan. Value goals: LDL < '70mg'$ , HDL > 40 mg.      Core Components/Risk Factors/Patient Goals Review:      Goals and Risk Factor Review    Row Name 03/17/17 0904 04/07/17 0940           Core Components/Risk Factors/Patient Goals Review   Personal Goals Review Weight Management/Obesity;Hypertension;Diabetes;Improve shortness of breath with ADL's;Lipids Weight Management/Obesity;Hypertension;Diabetes;Improve shortness of breath with ADL's;Lipids      Review FrJosph Machos doing well in rehab. It has been a good outlet for him.  [name]'s weight has been creeping up as his diet is not going as well.  His blood pressures and blood sugars have been good.  His A1C is down.  He is keeping his medications on a schedule which has been very helpful for him.   He is breathing better and able to get more stuff done around the house.   FrJosph Machos doing well in rehab.  His weight was 247 lbs today.  He is watching his diet more.  Blood pressures and blood sugars have been good and he does check them at home.   He is doing better with his SOB at home, only having trouble with stairs.        Expected Outcomes Short: FrJosph Machoill refocus his diet to try to lose weight.  He has an appt to meet with the dietician on Monday.  Long: Work on risk factor moification. Short: FrJosph Machoill still work on weight loss.  Long: Continue to work on risk factor modification.         Core Components/Risk Factors/Patient Goals at Discharge (Final Review):      Goals and Risk Factor Review - 04/07/17 0940      Core Components/Risk  Factors/Patient Goals Review   Personal Goals Review Weight Management/Obesity;Hypertension;Diabetes;Improve shortness of breath with ADL's;Lipids   Review FrJosph Machos doing well in rehab.  His weight was 247 lbs today.  He is watching his diet more.  Blood pressures and blood sugars have been good and he does check them at home.   He is doing better with his SOB at home, only having trouble with stairs.     Expected Outcomes Short: FrJosph Machoill still work on weight loss.  Long: Continue to work on risk factor modification.      ITP Comments:     ITP Comments    Row Name 02/11/17 1504 02/24/17 0548 03/24/17 0548 04/21/17 0546     ITP Comments Medical Review completed;  initial ITP completed;  diagnosis documentation on [facility] encounter 11/05/16. 30 day review. Continue with ITP unless directed changes per Medical Director review 30 day review. Continue with ITP unless directed changes per Medical Director review 30 day review. Continue with ITP unless directed changes per Medical Director review       Comments: Discharge ITP

## 2017-04-26 NOTE — Progress Notes (Signed)
Discharge Summary  Patient Details  Name: [name] [name] MRN: [id number] Date of Birth: December 04, 1946 Referring Provider:     Cardiac Rehab from 02/11/2017 in [facility]  Referring Provider  Serafina Royals MD       Number of Visits: 36/36  Reason for Discharge:  Patient reached a stable level of exercise. Patient independent in their exercise.  Smoking History:  History  Smoking Status  . Never Smoker  Smokeless Tobacco  . Never Used    Diagnosis:  S/P CABG x 3  ADL UCSD:   Initial Exercise Prescription:     Initial Exercise Prescription - 02/11/17 1400      Date of Initial Exercise RX and Referring Provider   Date 02/11/17   Referring Provider Serafina Royals MD     Treadmill   MPH 1.5   Grade 0   Minutes 15   METs 1.7     NuStep   Level 1   Minutes 15   METs 1.5     Biostep-RELP   Level 1   Minutes 15   METs 1     Prescription Details   Frequency (times per week) 3   Duration Progress to 45 minutes of aerobic exercise without signs/symptoms of physical distress     Intensity   THRR 40-80% of Max Heartrate 107-135   Ratings of Perceived Exertion 11-13   Perceived Dyspnea 0-4     Progression   Progression Continue to progress workloads to maintain intensity without signs/symptoms of physical distress.     Resistance Training   Training Prescription Yes   Weight 3 lbs   Reps 10-15      Discharge Exercise Prescription (Final Exercise Prescription Changes):     Exercise Prescription Changes - 04/15/17 1200      Response to Exercise   Blood Pressure (Admit) 138/80   Blood Pressure (Exercise) 128/64   Blood Pressure (Exit) 112/64   Heart Rate (Admit) 78 bpm   Heart Rate (Exercise) 100 bpm   Heart Rate (Exit) 92 bpm   Rating of Perceived Exertion (Exercise) 13   Symptoms none   Duration Continue with 45 min of aerobic exercise without signs/symptoms of physical distress.   Intensity THRR unchanged     Progression   Progression Continue to progress workloads to maintain intensity without signs/symptoms of physical distress.   Average METs 2.6     Resistance Training   Training Prescription Yes   Weight 3 lbs   Reps 10-15     Interval Training   Interval Training Yes   Equipment NuStep   Comments 2 min off 30 sec on     NuStep   Level 6   Minutes 15   METs 3.2     Biostep-RELP   Level 6   Minutes 15   METs 2     Home Exercise Plan   Plans to continue exercise at Home (comment)  walking   Frequency Add 2 additional days to program exercise sessions.   Initial Home Exercises Provided 02/24/17      Functional Capacity:     6 Minute Walk    Row Name 02/11/17 1456 04/19/17 0847       6 Minute Walk   Phase Initial Discharge    Distance 785 feet 1120 feet    Distance % Change  - 43 %    Walk Time 5.83 minutes 6 minutes    # of Rest Breaks 1  10 sec 6  MPH 1.5 2.12    METS 1.75 2.76    RPE 13 15    VO2 Peak 6.12 9.66    Symptoms Yes (comment) No    Comments complaint of left hip pain 3/10 [name] [name] stated had to walk carefully/a little slower due to an open ulser he currently has on his toe.      Resting HR 80 bpm 70 bpm    Resting BP 122/70 168/82    Max Ex. HR 109 bpm 110 bpm    Max Ex. BP 126/64 188/80       Psychological, QOL, Others - Outcomes: PHQ 2/9: Depression screen [facility] 2/9 04/05/2017 02/11/2017 09/17/2016  Decreased Interest 0 2 0  Down, Depressed, Hopeless 0 1 1  PHQ - 2 Score 0 3 1  Altered sleeping 1 3 -  Tired, decreased energy 2 3 -  Change in appetite 1 2 -  Feeling bad or failure about yourself  0 0 -  Trouble concentrating 0 2 -  Moving slowly or fidgety/restless 1 1 -  Suicidal thoughts 0 0 -  PHQ-9 Score 5 14 -  Difficult doing work/chores - Somewhat difficult -    Quality of Life:     Quality of Life - 04/05/17 0959      Quality of Life Scores   Health/Function Pre 23.54 %   Health/Function Post 19.92 %   Health/Function %  Change -15.38 %   Socioeconomic Pre 24.58 %   Socioeconomic Post 21.5 %   Socioeconomic % Change  -12.53 %   Psych/Spiritual Pre 29.5 %   Psych/Spiritual Post 26.36 %   Psych/Spiritual % Change -10.64 %   Family Pre 22.88 %   Family Post 20.7 %   Family % Change -9.53 %   GLOBAL Pre 24.69 %   GLOBAL Post 21.82 %   GLOBAL % Change -11.62 %      Personal Goals: Goals established at orientation with interventions provided to work toward goal.     Personal Goals and Risk Factors at Admission - 02/11/17 1512      Core Components/Risk Factors/Patient Goals on Admission    Weight Management Obesity;Weight Gain;Yes   Intervention Weight Management: Develop a combined nutrition and exercise program designed to reach desired caloric intake, while maintaining appropriate intake of nutrient and fiber, sodium and fats, and appropriate energy expenditure required for the weight goal.;Weight Management: Provide education and appropriate resources to help participant work on and attain dietary goals.;Weight Management/Obesity: Establish reasonable short term and long term weight goals.;Obesity: Provide education and appropriate resources to help participant work on and attain dietary goals.   Admit Weight 238 lb 1.6 oz (108 kg)   Goal Weight: Short Term 233 lb (105.7 kg)   Goal Weight: Long Term 200 lb (90.7 kg)   Expected Outcomes Short Term: Continue to assess and modify interventions until short term weight is achieved;Long Term: Adherence to nutrition and physical activity/exercise program aimed toward attainment of established weight goal;Weight Loss: Understanding of general recommendations for a balanced deficit meal plan, which promotes 1-2 lb weight loss per week and includes a negative energy balance of [id number] kcal/d   Sedentary Yes   Intervention Provide advice, education, support and counseling about physical activity/exercise needs.;Develop an individualized exercise prescription for  aerobic and resistive training based on initial evaluation findings, risk stratification, comorbidities and participant's personal goals.   Expected Outcomes Achievement of increased cardiorespiratory fitness and enhanced flexibility, muscular endurance and strength shown through measurements of functional  capacity and personal statement of participant.   Increase Strength and Stamina Yes   Intervention Provide advice, education, support and counseling about physical activity/exercise needs.;Develop an individualized exercise prescription for aerobic and resistive training based on initial evaluation findings, risk stratification, comorbidities and participant's personal goals.   Expected Outcomes Achievement of increased cardiorespiratory fitness and enhanced flexibility, muscular endurance and strength shown through measurements of functional capacity and personal statement of participant.   Improve shortness of breath with ADL's Yes   Intervention Provide education, individualized exercise plan and daily activity instruction to help decrease symptoms of SOB with activities of daily living.   Expected Outcomes Short Term: Achieves a reduction of symptoms when performing activities of daily living.   Diabetes Yes   Intervention Provide education about signs/symptoms and action to take for hypo/hyperglycemia.;Provide education about proper nutrition, including hydration, and aerobic/resistive exercise prescription along with prescribed medications to achieve blood glucose in normal ranges: Fasting glucose 65-99 mg/dL   Expected Outcomes Short Term: Participant verbalizes understanding of the signs/symptoms and immediate care of hyper/hypoglycemia, proper foot care and importance of medication, aerobic/resistive exercise and nutrition plan for blood glucose control.;Long Term: Attainment of HbA1C < 7%.   Hypertension Yes   Intervention Provide education on lifestyle modifcations including regular physical  activity/exercise, weight management, moderate sodium restriction and increased consumption of fresh fruit, vegetables, and low fat dairy, alcohol moderation, and smoking cessation.;Monitor prescription use compliance.   Expected Outcomes Short Term: Continued assessment and intervention until BP is < 140/42mm HG in hypertensive participants. < 130/16mm HG in hypertensive participants with diabetes, heart failure or chronic kidney disease.;Long Term: Maintenance of blood pressure at goal levels.   Lipids Yes   Intervention Provide education and support for participant on nutrition & aerobic/resistive exercise along with prescribed medications to achieve LDL 70mg , HDL >40mg .   Expected Outcomes Short Term: Participant states understanding of desired cholesterol values and is compliant with medications prescribed. Participant is following exercise prescription and nutrition guidelines.;Long Term: Cholesterol controlled with medications as prescribed, with individualized exercise RX and with personalized nutrition plan. Value goals: LDL < 70mg , HDL > 40 mg.       Personal Goals Discharge:     Goals and Risk Factor Review    Row Name 03/17/17 0904 04/07/17 0940           Core Components/Risk Factors/Patient Goals Review   Personal Goals Review Weight Management/Obesity;Hypertension;Diabetes;Improve shortness of breath with ADL's;Lipids Weight Management/Obesity;Hypertension;Diabetes;Improve shortness of breath with ADL's;Lipids      Review [name] [name] is doing well in rehab. It has been a good outlet for him.  [name]'s weight has been creeping up as his diet is not going as well.  His blood pressures and blood sugars have been good.  His A1C is down.  He is keeping his medications on a schedule which has been very helpful for him.   He is breathing better and able to get more stuff done around the house.   [name] [name] is doing well in rehab.  His weight was 247 lbs today.  He is watching his diet more.  Blood  pressures and blood sugars have been good and he does check them at home.   He is doing better with his SOB at home, only having trouble with stairs.        Expected Outcomes Short: [name] [name] will refocus his diet to try to lose weight.  He has an appt to meet with the dietician on Monday.  Long: Work on  risk factor moification. Short: [name] [name] will still work on weight loss.  Long: Continue to work on risk factor modification.         Nutrition & Weight - Outcomes:     Pre Biometrics - 02/11/17 1504      Pre Biometrics   Height 6' (1.829 m)   Weight 238 lb 1.6 oz (108 kg)   Waist Circumference 45.5 inches   Hip Circumference 45 inches   Waist to Hip Ratio 1.01 %   BMI (Calculated) 32.4         Post Biometrics - 04/19/17 0853       Post  Biometrics   Height 6' (1.829 m)   Weight 247 lb (112 kg)   Waist Circumference 46 inches   Hip Circumference 43.25 inches   Waist to Hip Ratio 1.06 %   BMI (Calculated) 33.6      Nutrition:     Nutrition Therapy & Goals - 03/22/17 1122      Nutrition Therapy   Diet Instructed on a meal plan based on heart healthy and diabetes guidelines.   Drug/Food Interactions Statins/Certain Fruits   Protein (specify units) 8   Fiber 30 grams   Whole Grain Foods 3 servings   Saturated Fats 13 max. grams   Fruits and Vegetables 5 servings/day   Sodium 1500 grams     Personal Nutrition Goals   Nutrition Goal Since patient writes down his schedule for the day, encourage to write out simple menus especially for evening meals.   Personal Goal #2 Add fruit or yogurt to lunch meal.   Personal Goal #3 Try lower sodium products such as very low sodium tuna, frozen vegetables, no salt added canned vegetables.   Personal Goal #4 Include a protein food with all meals.   Additional Goals? Yes   Personal Goal #5 When daughter comes next week, ask her to help in preparing some meals that can be frozen.   Personal Goal #6 Balance meals with protein, starch, and  non-starchy vegetables. Use food guide plate as a guide.   Comments Patient lives alone and either picks up meals or prepares very simple meals at home.  Gave simple meal ideas based on his preferences and foods available.     Intervention Plan   Intervention Prescribe, educate and counsel regarding individualized specific dietary modifications aiming towards targeted core components such as weight, hypertension, lipid management, diabetes, heart failure and other comorbidities.;Nutrition handout(s) given to patient.   Expected Outcomes Short Term Goal: Understand basic principles of dietary content, such as calories, fat, sodium, cholesterol and nutrients.;Short Term Goal: A plan has been developed with personal nutrition goals set during dietitian appointment.;Long Term Goal: Adherence to prescribed nutrition plan.      Nutrition Discharge:     Nutrition Assessments - 04/05/17 1003      MEDFICTS Scores   Pre Score 66   Post Score 99   Score Difference 33      Education Questionnaire Score:     Knowledge Questionnaire Score - 04/05/17 0959      Knowledge Questionnaire Score   Pre Score 15/28   Post Score 21/28      Goals reviewed with patient; copy given to patient.

## 2018-02-21 ENCOUNTER — Ambulatory Visit: Payer: Self-pay | Admitting: Psychiatry

## 2018-03-01 ENCOUNTER — Ambulatory Visit (INDEPENDENT_AMBULATORY_CARE_PROVIDER_SITE_OTHER): Payer: Medicare Other | Admitting: Psychiatry

## 2018-03-01 ENCOUNTER — Encounter: Payer: Self-pay | Admitting: Psychiatry

## 2018-03-01 VITALS — BP 154/77 | HR 79 | Temp 97.6°F | Wt 259.0 lb

## 2018-03-01 DIAGNOSIS — F330 Major depressive disorder, recurrent, mild: Secondary | ICD-10-CM | POA: Diagnosis not present

## 2018-03-01 DIAGNOSIS — F4312 Post-traumatic stress disorder, chronic: Secondary | ICD-10-CM | POA: Diagnosis not present

## 2018-03-01 NOTE — Progress Notes (Signed)
Psychiatric Initial Adult Assessment   Patient Identification: [name] MRN:  [id number] Date of Evaluation:  03/01/2018 Referral Source: Dr.Zachary Potter  Chief Complaint:  ' I am sad."  Chief Complaint    Establish Care; Other     Visit Diagnosis:    ICD-10-CM   1. MDD (major depressive disorder), recurrent episode, mild ([place]) F33.0   2. Chronic post-traumatic stress disorder (PTSD) F43.12     History of Present Illness:  Lewin is a 72 year old Caucasian male, widowed, lives in [place], has a history of CVA ,CAD , HTN , hyperlipidemia, BPH, vitamin B12 deficiency, peripheral neuropathy, RLS,Ulcer on left toe, as well as grief reaction, mood symptoms presented to the clinic today referred by his neurologist Dr. Melrose Nakayama at [facility].    Patient reports that his wife of  72 years passed away 1-1/2 years ago.  Patient reports that ever since that he has been feeling some symptoms of depression.  He reports that he feels sad on a regular basis, has fatigue, unable to concentrate, and so on.  He reports he would like to talk to a therapist and would like to get counseling if possible.  He reports he is currently on paroxetine 60 mg daily which he has been on since the past several years.  He thinks it is effective and he did not do well the last time he stopped taking it.  He reports his appetite is good.  He reports his sleep is fair.  He denies any suicidality.  He denies any perceptual disturbances at this time.  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  He however reports it is nothing new he has done that for a long time.  He reports he he has a history of PTSD.  He used to be in [place], drafted when he was very young.  He reports he got PTSD from [facility] agent.  He reports history of severe PTSD symptoms like nightmares, intrusive memories, anxiety symptoms and sound.  He currently reports his symptoms are stable.  He denies any  substance abuse problems.  He does have several medical problems including coronary artery disease, history of CVA, hyperlipidemia, hypertension, vitamin B12 deficiency, RLS and BPH.  He follows up with his primary medical doctor as well as his neurologist who is monitoring his problems closely.  He reports he is compliant with his medications.    He does have social support from his son who is a Technical brewer who lives close to his house.  He also has a daughter who lives in another state but checks on him every day and calls him at least twice every day.     Associated Signs/Symptoms: Depression Symptoms:  depressed mood, fatigue, (Hypo) Manic Symptoms:  denies Anxiety Symptoms:  Excessive Worry, Psychotic Symptoms:  denies PTSD Symptoms: Had a traumatic exposure:  yes as noted above  Past Psychiatric History: PTSD, MDD.  He reports he used to follow up with the [facility] in the past.  He denies any suicide attempts.  He denies any inpatient mental health admissions.  Previous Psychotropic Medications: Yes , on and off on Paxil in the past  Substance Abuse History in the last 12 months:  No.  Consequences of Substance Abuse: Negative  Past Medical History:  Past Medical History:  Diagnosis Date  . BPH (benign prostatic hyperplasia)   . Depression   . Diabetes mellitus without complication ([place])   . HLD (hyperlipidemia)   .  Hypertension   . Neuropathy   . PTSD (post-traumatic stress disorder)   . Stroke [facility])     Past Surgical History:  Procedure Laterality Date  . BYPASS GRAFT    . CARDIAC CATHETERIZATION Left 10/28/2016   Procedure: Left Heart Cath and Coronary Angiography;  Surgeon: Corey Skains, MD;  Location: [facility];  Service: Cardiovascular;  Laterality: Left;  . SHOULDER ARTHROSCOPY    . UVULOPALATOPHARYNGOPLASTY, TONSILLECTOMY AND SEPTOPLASTY      Family Psychiatric History: Denies any family history of mental health problems.  Family History:  Family  History  Problem Relation Age of Onset  . Hypertension Unknown   . Diabetes Mellitus II Unknown   . Diabetes Mother   . Diabetes Sister     Social History:   Social History   Socioeconomic History  . Marital status: Widowed    Spouse name: None  . Number of children: 2  . Years of education: None  . Highest education level: Some college, no degree  Social Needs  . Financial resource strain: Not hard at all  . Food insecurity - worry: Never true  . Food insecurity - inability: Never true  . Transportation needs - medical: No  . Transportation needs - non-medical: No  Occupational History    Comment: retired  Tobacco Use  . Smoking status: Never Smoker  . Smokeless tobacco: Never Used  Substance and Sexual Activity  . Alcohol use: No    Alcohol/week: 0.0 oz  . Drug use: No  . Sexual activity: Not Currently  Other Topics Concern  . None  Social History Narrative  . None    Additional Social History: He reports he was raised in [place].  He reports he was drafted to [place] at a very young age.  He was in [place] for 1-2 years.  He reports he continues to get VA benefits.  He reports he got married to his wife at a very young age and they stayed married for 49 years until she passed away 1.5 years ago.  He has 2 children a son and a daughter.  His daughter lives in another state but calls him every day and is very supportive.  His son who is a Technical brewer lives a few blocks from his house and is supportive.  He reports he has social support from friends and neighbors.  Allergies:  No Known Allergies  Metabolic Disorder Labs: Lab Results  Component Value Date   HGBA1C 13.3 (H) 06/27/2016   No results found for: PROLACTIN Lab Results  Component Value Date   CHOL 200 06/28/2016   TRIG 515 (H) 06/28/2016   HDL 29 (L) 06/28/2016   CHOLHDL 6.9 06/28/2016   VLDL UNABLE TO CALCULATE IF TRIGLYCERIDE OVER 400 mg/dL 06/28/2016   LDLCALC UNABLE TO CALCULATE IF TRIGLYCERIDE OVER  400 mg/dL 06/28/2016     Current Medications: Current Outpatient Medications  Medication Sig Dispense Refill  . aspirin 81 MG chewable tablet Chew 1 tablet (81 mg total) by mouth before cath procedure. 90 tablet 4  . cholecalciferol (VITAMIN D) 1000 UNITS tablet Take 1,000 Units by mouth daily.    . finasteride (PROSCAR) 5 MG tablet Take 5 mg by mouth daily.    . insulin aspart (NOVOLOG) 100 UNIT/ML injection Inject 20 Units into the skin 3 (three) times daily before meals.    . insulin glargine (LANTUS) 100 UNIT/ML injection Inject 75 Units into the skin at bedtime.    Marland Kitchen lisinopril (PRINIVIL,ZESTRIL) 20 MG  tablet Take 20 mg by mouth daily.    . metFORMIN (GLUCOPHAGE) 850 MG tablet Take 850 mg by mouth 3 (three) times daily.    . nortriptyline (PAMELOR) 10 MG capsule Take 30 mg by mouth at bedtime. Patient taking differently    . PARoxetine (PAXIL) 40 MG tablet Take 60 mg by mouth every morning.     . prazosin (MINIPRESS) 5 MG capsule Take 5 mg by mouth 2 (two) times daily.     Marland Kitchen rOPINIRole (REQUIP) 0.5 MG tablet Take 0.5 mg by mouth at bedtime.    . simvastatin (ZOCOR) 40 MG tablet Take 40 mg by mouth at bedtime.    . sucralfate (CARAFATE) 1 g tablet Take 1 tablet (1 g total) by mouth 4 (four) times daily. 60 tablet 0  . tamsulosin (FLOMAX) 0.4 MG CAPS capsule Take 0.4 mg by mouth daily.    Marland Kitchen tiZANidine (ZANAFLEX) 2 MG tablet Take 2 mg by mouth at bedtime.    . traMADol-acetaminophen (ULTRACET) 37.5-325 MG tablet Take 1 tablet by mouth every 6 (six) hours as needed.     . vitamin B-12 (CYANOCOBALAMIN) 1000 MCG tablet Take 1,000 mcg by mouth at bedtime.     No current facility-administered medications for this visit.     Neurologic: Headache: No Seizure: No Paresthesias:Yes  Musculoskeletal: Strength & Muscle Tone: within normal limits Gait & Station: normal Patient leans: N/A  Psychiatric Specialty Exam: Review of Systems  Psychiatric/Behavioral: Positive for depression. The  patient is nervous/anxious.   All other systems reviewed and are negative.   Blood pressure (!) 154/77, pulse 79, temperature 97.6 F (36.4 C), temperature source Oral, weight 259 lb (117.5 kg).Body mass index is 35.13 kg/m.  General Appearance: Casual  Eye Contact:  Fair  Speech:  Normal Rate  Volume:  Normal  Mood:  Anxious and Dysphoric  Affect:  Congruent  Thought Process:  Goal Directed and Descriptions of Associations: Circumstantial  Orientation:  Full (Time, Place, and Person)  Thought Content:  Logical  Suicidal Thoughts:  No  Homicidal Thoughts:  No  Memory:  Immediate;   Fair Recent;   Fair Remote;   Fair  Judgement:  Fair  Insight:  Fair  Psychomotor Activity:  Normal  Concentration:  Concentration: Fair and Attention Span: Fair  Recall:  [employer] of Knowledge:Fair  Language: Fair  Akathisia:  No  Handed:  Right  AIMS (if indicated):  NA  Assets:  Communication Skills Desire for Improvement Housing Resilience Social Support  ADL's:  Intact  Cognition: WNL  Sleep:  fair    Treatment Plan Summary:Ralf is a 72 year old Caucasian male who has a history of depression, PTSD, as well as several medical problems including CVA ,CAD , HTN , hyperlipidemia, BPH, vitamin B12 deficiency, peripheral neuropathy, RLS,Ulcer on left toe, as well as grief reaction, mood symptoms presented to the clinic today referred by his neurologist Dr. Melrose Nakayama at [facility].  Patient reports recent worsening of his depressive symptoms since the past few months .  He reports he constantly thinks about his wife who passed away and that is his major stressor.  He also lives alone even though he does have social support system available.  He also has several medical problems which he deals with which limits his mobility and quality of life.  Patient reports he is motivated to start psychotherapy and would like to continue his antidepressant at this time.  Plan as noted below. Medication  management and Plan as noted below  Plan For MDD Will continue Paxil 60 mg p.o. daily.  He has been on that dose since a long time. Will refer him to Ms. Royal Piedra here in clinic for psychotherapy.  Patient will also benefit from grief therapy.  For history of chronic PTSD He is on Paxil which  helps. He currently reports his symptoms as stable. He can also discuss this during his psychotherapy sessions.  Reviewed his labs-TSH-2.347 ( 01/28/2017)- wnl. Order labs like vitamin B12, folate, vitamin D and so on if not already done by his PMD.  Provided supportive psychotherapy.  Follow-up in clinic in 1 month or sooner if needed.  More than 50 % of the time was spent for psychoeducation and supportive psychotherapy and care coordination.  This note was generated in part or whole with voice recognition software. Voice recognition is usually quite accurate but there are transcription errors that can and very often do occur. I apologize for any typographical errors that were not detected and corrected.     Ursula Alert, MD 3/6/201910:36 AM

## 2018-03-02 ENCOUNTER — Ambulatory Visit (INDEPENDENT_AMBULATORY_CARE_PROVIDER_SITE_OTHER): Payer: Medicare Other | Admitting: Licensed Clinical Social Worker

## 2018-03-02 ENCOUNTER — Encounter: Payer: Self-pay | Admitting: Psychiatry

## 2018-03-02 DIAGNOSIS — F330 Major depressive disorder, recurrent, mild: Secondary | ICD-10-CM

## 2018-03-02 DIAGNOSIS — F4312 Post-traumatic stress disorder, chronic: Secondary | ICD-10-CM | POA: Diagnosis not present

## 2018-03-02 NOTE — Progress Notes (Signed)
Comprehensive Clinical Assessment (CCA) Note  03/02/2018 MARQUAIL BRADWELL [id number]  Visit Diagnosis:      ICD-10-CM   1. MDD (major depressive disorder), recurrent episode, mild ([place]) F33.0   2. Chronic post-traumatic stress disorder (PTSD) F43.12       CCA Part One  Part One has been completed on paper by the patient.  (See scanned document in Chart Review)  CCA Part Two A  Intake/Chief Complaint:  CCA Intake With Chief Complaint CCA Part Two Date: 03/02/18 CCA Part Two Time: 1103 Chief Complaint/Presenting Problem: I am lonely.  I lost my wife about a year ago.  I Patients Currently Reported Symptoms/Problems: I don't like meeting new people.  I have had problems since I left the [employer].  I live alone.  I use to get ancious a lot.  My son lives near me and my daughter lives in [place].  She visits about 3 times a year and I go there twice a year.  I get road rage.  My neighborhood is full of young people that work throughout the day.  I am from [place] and things were different there.  I worked for the [employer] authority and [facility].  I don't like loud noises.  I don't like the 4th of July due to the fireworks. I am hypervigilant. I was in the [place] War. I am 100percent disabled from the [place].  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  I have ringing in my ears as well.   Mental Health Symptoms Depression:  Depression: Tearfulness, Hopelessness, Change in energy/activity, Difficulty Concentrating, Fatigue, Sleep (too much or little)  Mania:  Mania: N/A  Anxiety:   Anxiety: Worrying, Tension(worries about his health and his Grandchildren)  Psychosis:  Psychosis: N/A  Trauma:  Trauma: Re-experience of traumatic event, Hypervigilance  Obsessions:  Obsessions: N/A  Compulsions:  Compulsions: N/A  Inattention:  Inattention: N/A  Hyperactivity/Impulsivity:  Hyperactivity/Impulsivity: N/A  Oppositional/Defiant Behaviors:  Oppositional/Defiant Behaviors: N/A  Borderline Personality:   Emotional Irregularity: N/A  Other Mood/Personality Symptoms:      Mental Status Exam Appearance and self-care  Stature:  Stature: Average  Weight:  Weight: Overweight  Clothing:  Clothing: Casual  Grooming:  Grooming: Normal  Cosmetic use:  Cosmetic Use: None  Posture/gait:  Posture/Gait: Normal  Motor activity:  Motor Activity: Not Remarkable  Sensorium  Attention:  Attention: Normal  Concentration:  Concentration: Normal  Orientation:  Orientation: X5  Recall/memory:  Recall/Memory: Normal  Affect and Mood  Affect:  Affect: Appropriate  Mood:  Mood: Pessimistic  Relating  Eye contact:  Eye Contact: Normal  Facial expression:  Facial Expression: Responsive  Attitude toward examiner:  Attitude Toward Examiner: Cooperative  Thought and Language  Speech flow: Speech Flow: Normal  Thought content:  Thought Content: Appropriate to mood and circumstances  Preoccupation:     Hallucinations:     Organization:     Transport planner of Knowledge:  Fund of Knowledge: Average  Intelligence:  Intelligence: Average  Abstraction:  Abstraction: Normal  Judgement:  Judgement: Normal  Reality Testing:  Reality Testing: Adequate  Insight:  Insight: Good  Decision Making:  Decision Making: Normal  Social Functioning  Social Maturity:  Social Maturity: Responsible  Social Judgement:  Social Judgement: Normal  Stress  Stressors:  Stressors: Brewing technologist, Transitions  Coping Ability:  Coping Ability: English as a second language teacher Deficits:     Supports:      Family and Psychosocial History: Family history Marital status: Widowed Widowed,  when?: year and half ago Are you sexually active?: No What is your sexual orientation?: heterosexual Does patient have children?: Yes How many children?: 2(Aleric 21, Dawn 73) How is patient's relationship with their children?: My son is a Quarry manager.  He lives about a 100 yards from me.  My daughter lives in [place].  She calls a lot and checks on  me.  She visits me about 3 times a year and I visit her 2 times a year.  My daughter is a Pharmacist, hospital.  She is going through a divorce. My son has 3 children and my daughter has one.  Childhood History:  Childhood History By whom was/is the patient raised?: Both parents Additional childhood history information: Born in [place].  Reports that [place] lived in [place].  Everyone knew everyone. I could not do anything without someone watching.  I was not about to use profanity. My Grandfather lived with [place] until I was 29. Description of patient's relationship with caregiver when they were a child: Mother: she was good to me.  I was in trouble a few times she covered it up for me.  Father: he only "spanked" me twice.  Once for a bad grade while I was in the 8th grade. The second time I was late for curfew; after 11pm.  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  She was a nun for a while.  I left to college when I was 16 she was 11.  I was then drafted to the [employer] and then I got married. Did patient suffer any verbal/emotional/physical/sexual abuse as a child?: No Did patient suffer from severe childhood neglect?: No Has patient ever been sexually abused/assaulted/raped as an adolescent or adult?: No Was the patient ever a victim of a crime or a disaster?: No Witnessed domestic violence?: No Has patient been effected by domestic violence as an adult?: No  CCA Part Two B  Employment/Work Situation: Employment / Work Situation Employment situation: Retired(Since Dec 27, 1998. 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  Served 2 years; one year in [place]. Basic training in the summer in [place] for 8 weeks.  AIT 8 weeks & [place] Training for 2 weeks at [employer]. [place].) Has patient ever served in combat?: Yes Patient description of combat service: Infantry in [place] Did You Receive Any Psychiatric Treatment/Services While in the [employer]?: No Are There Guns or Other Weapons in [place]?: No Are These Psychologist, educational?: Yes  Education: Education Name of [employer]: [employer] in [place] Did Teacher, adult education From [employer]?: Yes Did Physicist, medical?: Yes What Type of College Degree Do you Have?: 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 Bachelor's degree due to draft([employer] in [place]) 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; 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; drafted Did You Have Any Special Interests In School?: football; received football scholarship Did You Have An Individualized Education Program (  IIEP): No Did You Have Any Difficulty At School?: No  Religion: Religion/Spirituality Are You A Religious Person?: Yes What is Your Religious Affiliation?: Catholic How Might This Affect Treatment?: denies  Leisure/Recreation: Leisure / Recreation Leisure and Hobbies: nothing but watch tv (favorite shows: barney, home improvement) likes older movies  Exercise/Diet: Exercise/Diet Do You Exercise?: No Have You Gained or Lost A Significant Amount of Weight in the Past Six Months?: No Do You Follow a Special Diet?: No Do You Have Any Trouble Sleeping?: Yes Explanation of Sleeping Difficulties: oversleeps  CCA Part Two C  Alcohol/Drug Use: Alcohol / Drug Use Pain Medications: denies Prescriptions: Proscar, Cyanocobalamin, Novolog, Ultracet,  Lantus, Zanaflex, Zestril, Carafate, Flomax, Glucophage, Requip, Zocor, Pamelor, Paxil, Minipress Over the Counter: aspirin 81mg , vitamin D,  History of alcohol / drug use?: No history of alcohol / drug abuse                      CCA Part Three  ASAM's:  Six Dimensions of Multidimensional Assessment  Dimension 1:  Acute Intoxication and/or Withdrawal Potential:     Dimension 2:  Biomedical Conditions and Complications:     Dimension 3:  Emotional, Behavioral, or Cognitive Conditions and Complications:     Dimension 4:  Readiness to Change:     Dimension 5:  Relapse, Continued use, or Continued Problem Potential:     Dimension 6:  Recovery/Living Environment:      Substance use Disorder (SUD)    Social Function:  Social Functioning Social Maturity: Responsible Social Judgement: Normal  Stress:  Stress Stressors: Grief/losses, Transitions Coping Ability: Overwhelmed Patient Takes Medications The Way The Doctor Instructed?: Yes Priority Risk: Low Acuity  Risk Assessment- Self-Harm Potential: Risk Assessment For Self-Harm Potential Thoughts of Self-Harm: No current thoughts Method: No plan Availability of Means: No access/NA  Risk Assessment -Dangerous to Others Potential: Risk Assessment For Dangerous to Others Potential Method: No Plan Availability of Means: No access or NA Intent: Vague intent or NA Notification Required: No need or identified person  DSM5 Diagnoses: Patient Active Problem List   Diagnosis Date Noted  . Stable angina ([place]) 10/13/2016  . Acute CVA (cerebrovascular accident) ([place]) 06/29/2016  . Stroke ([place]) 06/28/2016  . Elevated troponin 06/27/2016  . HTN (hypertension) 06/27/2016  . HLD (hyperlipidemia) 06/27/2016  . Type 2 diabetes mellitus ([place]) 06/27/2016  . BPH (benign prostatic hyperplasia) 06/27/2016  . Extremity numbness 06/27/2016  . AKI (acute kidney injury) ([place]) 06/27/2016    Patient Centered Plan: Patient is on the  following Treatment Plan(s):  Depression and PTSD  Recommendations for Services/Supports/Treatments: Recommendations for Services/Supports/Treatments Recommendations For Services/Supports/Treatments: Individual Therapy, Medication Management  Treatment Plan Summary:    Referrals to Alternative Service(s): Referred to Alternative Service(s):   Place:   Date:   Time:    Referred to Alternative Service(s):   Place:   Date:   Time:    Referred to Alternative Service(s):   Place:   Date:   Time:    Referred to Alternative Service(s):   Place:   Date:   Time:     Lubertha South

## 2018-03-29 ENCOUNTER — Ambulatory Visit: Payer: Self-pay | Admitting: Licensed Clinical Social Worker

## 2018-04-12 ENCOUNTER — Ambulatory Visit: Payer: Medicare Other | Admitting: Psychiatry

## 2018-04-14 ENCOUNTER — Ambulatory Visit (INDEPENDENT_AMBULATORY_CARE_PROVIDER_SITE_OTHER): Payer: Medicare Other | Admitting: Licensed Clinical Social Worker

## 2018-04-14 DIAGNOSIS — F330 Major depressive disorder, recurrent, mild: Secondary | ICD-10-CM

## 2018-04-19 ENCOUNTER — Encounter: Payer: Self-pay | Admitting: Psychiatry

## 2018-04-19 ENCOUNTER — Other Ambulatory Visit: Payer: Self-pay

## 2018-04-19 ENCOUNTER — Ambulatory Visit (INDEPENDENT_AMBULATORY_CARE_PROVIDER_SITE_OTHER): Payer: Medicare Other | Admitting: Psychiatry

## 2018-04-19 ENCOUNTER — Telehealth: Payer: Self-pay

## 2018-04-19 VITALS — BP 148/64 | HR 98 | Temp 98.0°F | Ht 72.01 in | Wt 261.0 lb

## 2018-04-19 DIAGNOSIS — F330 Major depressive disorder, recurrent, mild: Secondary | ICD-10-CM

## 2018-04-19 DIAGNOSIS — F4312 Post-traumatic stress disorder, chronic: Secondary | ICD-10-CM

## 2018-04-19 MED ORDER — PAROXETINE HCL 40 MG PO TABS: 60.0000 mg | ORAL_TABLET | ORAL | 1 refills | Status: DC

## 2018-04-19 NOTE — Telephone Encounter (Signed)
received a fax that pt insurance willl only cover one tab per day of the paroxetine  not one and 1/2  please send new rx with changes.    PARoxetine (PAXIL) 40 MG tablet 135 tablet 1 04/19/2018    Sig - Route: Take 1.5 tablets (60 mg total) by mouth every morning. - Oral   Sent to pharmacy as: PARoxetine (PAXIL) 40 MG tablet   E-Prescribing Status: Receipt confirmed by pharmacy (04/19/2018 11:48 AM EDT)

## 2018-04-19 NOTE — Telephone Encounter (Signed)
Unable to give 60 mg one tab, I do not think it comes in 59 's .

## 2018-04-19 NOTE — Progress Notes (Signed)
[facility] MD OP Progress Note  04/19/2018 2:07 PM [name]  MRN:  [id number]  Chief Complaint: ' I am here for follow up.' Chief Complaint    Follow-up; Medication Refill     HPI: [name] is a 72 year old Caucasian male, widowed, lives in [place], has a history of CVA, CAD, hypertension, hyperlipidemia, BPH, vitamin B12 deficiency, peripheral neuropathy, RLS, ulcer on left foot, grief reaction, presented to the clinic today for a follow-up visit.  Patient today reports he continues to feel sad on and off mostly when he thinks about his wife who passed away 1-1/2 years ago.  He reports he and his wife were together for 49 years.  Patient reports he does have social support from his children.  He also spent time with his family during the Easter holiday.  He however reports he constantly thinks about his wife.  He also feels lonely and isolated often and feels there is no one to talk to.  He reports he sleeps well.  He is on Pamelor as well as Requip at bedtime.  Both medications were initiated by his previous providers/PMD.  He continues to be in psychotherapy with Ms. Peacock.  Some time was spent providing reassurance and supportive psychotherapy.  Discussed leisure activities, walking, going to a senior community center and so on.  Patient reports he does have access to a senior community center and would like to pursue the same.  Patient denies any suicidality or perceptual disturbances.  Patient continues to be compliant on his medications and denies any side effects at this time. Visit Diagnosis:    ICD-10-CM   1. MDD (major depressive disorder), recurrent episode, mild ([place]) F33.0   2. Chronic post-traumatic stress disorder (PTSD) F43.12     Past Psychiatric History: Reviewed past psychiatric history from my progress note on 03/01/2018 . Past Medical History:  Past Medical History:  Diagnosis Date  . BPH (benign prostatic hyperplasia)   . Depression   . Diabetes mellitus  without complication ([place])   . HLD (hyperlipidemia)   . Hypertension   . Neuropathy   . PTSD (post-traumatic stress disorder)   . Stroke [facility])     Past Surgical History:  Procedure Laterality Date  . BYPASS GRAFT    . CARDIAC CATHETERIZATION Left 10/28/2016   Procedure: Left Heart Cath and Coronary Angiography;  Surgeon: Corey Skains, MD;  Location: [facility] [facility];  Service: Cardiovascular;  Laterality: Left;  . SHOULDER ARTHROSCOPY    . UVULOPALATOPHARYNGOPLASTY, TONSILLECTOMY AND SEPTOPLASTY      Family Psychiatric History:Denies any family history of mental health problems.  Family History:  Family History  Problem Relation Age of Onset  . Hypertension Unknown   . Diabetes Mellitus II Unknown   . Diabetes Mother   . Diabetes Sister    Substance abuse history: Denies  Social History: Reviewed social history from my progress note on 03/01/2018 Social History   Socioeconomic History  . Marital status: Widowed    Spouse name: Not on file  . Number of children: 2  . Years of education: Not on file  . Highest education level: Some college, no degree  Occupational History    Comment: retired  Scientific laboratory technician  . Financial resource strain: Not hard at all  . Food insecurity:    Worry: Never true    Inability: Never true  . Transportation needs:    Medical: No    Non-medical: No  Tobacco Use  . Smoking status: Never Smoker  .  Smokeless tobacco: Never Used  Substance and Sexual Activity  . Alcohol use: No    Alcohol/week: 0.0 oz  . Drug use: No  . Sexual activity: Not Currently  Lifestyle  . Physical activity:    Days per week: 0 days    Minutes per session: 0 min  . Stress: Not at all  Relationships  . Social connections:    Talks on phone: Not on file    Gets together: Not on file    Attends religious service: Never    Active member of club or organization: No    Attends meetings of clubs or organizations: Never    Relationship status: Widowed  Other  Topics Concern  . Not on file  Social History Narrative  . Not on file    Allergies: No Known Allergies  Metabolic Disorder Labs: Lab Results  Component Value Date   HGBA1C 13.3 (H) 06/27/2016   No results found for: PROLACTIN Lab Results  Component Value Date   CHOL 200 06/28/2016   TRIG 515 (H) 06/28/2016   HDL 29 (L) 06/28/2016   CHOLHDL 6.9 06/28/2016   VLDL UNABLE TO CALCULATE IF TRIGLYCERIDE OVER 400 mg/dL 06/28/2016   LDLCALC UNABLE TO CALCULATE IF TRIGLYCERIDE OVER 400 mg/dL 06/28/2016   No results found for: TSH  Therapeutic Level Labs: No results found for: LITHIUM No results found for: VALPROATE No components found for:  CBMZ  Current Medications: Current Outpatient Medications  Medication Sig Dispense Refill  . aspirin 81 MG chewable tablet Chew 1 tablet (81 mg total) by mouth before cath procedure. 90 tablet 4  . cholecalciferol (VITAMIN D) 1000 UNITS tablet Take 1,000 Units by mouth daily.    . finasteride (PROSCAR) 5 MG tablet Take 5 mg by mouth daily.    . insulin aspart (NOVOLOG) 100 UNIT/ML injection Inject 20 Units into the skin 3 (three) times daily before meals.    . insulin glargine (LANTUS) 100 UNIT/ML injection Inject 75 Units into the skin at bedtime.    Marland Kitchen lisinopril (PRINIVIL,ZESTRIL) 20 MG tablet Take 20 mg by mouth daily.    . metFORMIN (GLUCOPHAGE) 850 MG tablet Take 850 mg by mouth 3 (three) times daily.    . nortriptyline (PAMELOR) 10 MG capsule Take 30 mg by mouth at bedtime. Patient taking differently    . PARoxetine (PAXIL) 40 MG tablet Take 1.5 tablets (60 mg total) by mouth every morning. 135 tablet 1  . prazosin (MINIPRESS) 5 MG capsule Take 5 mg by mouth 2 (two) times daily.     Marland Kitchen rOPINIRole (REQUIP) 0.5 MG tablet Take 0.5 mg by mouth at bedtime.    . simvastatin (ZOCOR) 40 MG tablet Take 40 mg by mouth at bedtime.    . sucralfate (CARAFATE) 1 g tablet Take 1 tablet (1 g total) by mouth 4 (four) times daily. 60 tablet 0  . tamsulosin  (FLOMAX) 0.4 MG CAPS capsule Take 0.4 mg by mouth daily.    Marland Kitchen tiZANidine (ZANAFLEX) 2 MG tablet Take 2 mg by mouth at bedtime.    . traMADol-acetaminophen (ULTRACET) 37.5-325 MG tablet Take 1 tablet by mouth every 6 (six) hours as needed.     . vitamin B-12 (CYANOCOBALAMIN) 1000 MCG tablet Take 1,000 mcg by mouth at bedtime.     No current facility-administered medications for this visit.      Musculoskeletal: Strength & Muscle Tone: within normal limits Gait & Station: normal Patient leans: N/A  Psychiatric Specialty Exam: Review of Systems  Psychiatric/Behavioral:  Positive for depression (improving).  All other systems reviewed and are negative.   Blood pressure (!) 148/64, pulse 98, temperature 98 F (36.7 C), temperature source Oral, height 6' 0.01" (1.829 m), weight 261 lb (118.4 kg).Body mass index is 35.39 kg/m.  General Appearance: Casual  Eye Contact:  Fair  Speech:  Normal Rate  Volume:  Normal  Mood:  Dysphoric  Affect:  Appropriate  Thought Process:  Goal Directed and Descriptions of Associations: Intact  Orientation:  Full (Time, Place, and Person)  Thought Content: Logical   Suicidal Thoughts:  No  Homicidal Thoughts:  No  Memory:  Immediate;   Fair Recent;   Fair Remote;   Fair  Judgement:  Fair  Insight:  Fair  Psychomotor Activity:  Decreased  Concentration:  Concentration: Fair and Attention Span: Fair  Recall:  [employer] of Knowledge: Fair  Language: Fair  Akathisia:  No  Handed:  Right  AIMS (if indicated): na  Assets:  Communication Skills Desire for Improvement Social Support  ADL's:  Intact  Cognition: WNL  Sleep:  Fair   Screenings: PHQ2-9     Cardiac Re[name] Cardiac Rehab from 02/11/2017 in [facility] Cardiac and Pulmonary Rehab Nutrition from 09/17/2016 in [facility]  PHQ-2 Total Score  0  3  1  PHQ-9 Total Score  5  14  -       Assessment and Plan: [name] is a  72 year old male who has a history of depression, PTSD, multiple medical problems including CVA, CAD, hypertension, hyperlipidemia, BPH, vitamin B12 deficiency, peripheral neuropathy, RLS, an ulcer on left toe, grief reaction, mood symptoms, presented to the clinic today for a follow-up visit.  Patient continues to struggle with some depressive symptoms mostly due to his wife's death.  He does have good social support system available.  He also is motivated to continue psychotherapy.  He continues to be compliant on his medications.  Discussed medication readjustment and plan as noted below.  Plan MDD Continue Paxil 60 mg p.o. daily.  For history of chronic PTSD He denies any current symptoms. Continue Paxil which also helps  Continue psychotherapy with Ms. Peacock for his mood symptoms, grief reaction to his wife's death.  Follow-up in clinic in 2 months or sooner if needed.  More than 50 % of the time was spent for psychoeducation and supportive psychotherapy and care coordination.  This note was generated in part or whole with voice recognition software. Voice recognition is usually quite accurate but there are transcription errors that can and very often do occur. I apologize for any typographical errors that were not detected and corrected.      Ursula Alert, MD 04/20/2018, 8:39 AM

## 2018-04-19 NOTE — Telephone Encounter (Signed)
Please ask pharmacy if they have sixty mg tabs available .

## 2018-04-20 ENCOUNTER — Encounter: Payer: Self-pay | Admitting: Psychiatry

## 2018-04-21 ENCOUNTER — Telehealth: Payer: Self-pay

## 2018-04-21 MED ORDER — PAROXETINE HCL 40 MG PO TABS: 40.0000 mg | ORAL_TABLET | ORAL | 1 refills | Status: DC

## 2018-04-21 MED ORDER — PAROXETINE HCL 20 MG PO TABS: 20.0000 mg | ORAL_TABLET | Freq: Every day | ORAL | 1 refills | Status: DC

## 2018-04-21 NOTE — Telephone Encounter (Signed)
Sent paxil to pharmacy

## 2018-04-21 NOTE — Telephone Encounter (Signed)
Pt insurance will only cover 1 pill a day.  You will need to send in a 40mg  and a 20mg  90 day supply each.      PARoxetine (PAXIL) 40 MG tablet 135 tablet 1 04/19/2018    Sig - Route: Take 1.5 tablets (60 mg total) by mouth every morning. - Oral   Sent to pharmacy as: PARoxetine (PAXIL) 40 MG tablet   E-Prescribing Status: Receipt confirmed by pharmacy (04/19/2018 11:48 AM EDT)

## 2018-05-02 ENCOUNTER — Other Ambulatory Visit: Payer: Self-pay | Admitting: Psychiatry

## 2018-05-02 NOTE — Progress Notes (Signed)
   THERAPIST PROGRESS NOTE  Session Time: 1 hour  Participation Level: Active  Behavioral Response: CasualAlertEuthymic  Type of Therapy: Individual Therapy  Treatment Goals addressed: Coping  Interventions: CBT and Motivational Interviewing  Summary: [name] is a 72 y.o. male who presents with continued symptoms of his diagnosis.  Therapist met with Patient in an initial therapy session to assess current mood and to build rapport. Therapist engaged Patient in discussion about her life and what is going well for her. Therapist provided support for Patient as he shared details about his life, his current stressors, mood, coping skills, his past, and children. Therapist prompted Patient to discuss his support system and ways that he manages his daily stress, anger, and frustrations. LCSW discussed what psychotherapy is and is not and the importance of the therapeutic relationship to include open and honest communication between client and therapist and building trust.  Reviewed advantages and disadvantages of the therapeutic process and limitations to the therapeutic relationship including LCSW's role in maintaining the safety of the client, others and those in client's care.     Suicidal/Homicidal: No  Plan: Return again in 2 weeks.  Diagnosis: Axis I: Depression    Axis II: No diagnosis    Lubertha South, LCSW 04/14/2018

## 2018-05-03 ENCOUNTER — Other Ambulatory Visit: Payer: Self-pay | Admitting: Psychiatry

## 2018-05-20 ENCOUNTER — Ambulatory Visit: Payer: Self-pay | Admitting: Psychiatry

## 2018-06-01 ENCOUNTER — Ambulatory Visit: Payer: Medicare Other | Admitting: Psychiatry

## 2018-06-27 ENCOUNTER — Other Ambulatory Visit: Payer: Self-pay

## 2018-06-27 ENCOUNTER — Encounter: Payer: Self-pay | Admitting: Psychiatry

## 2018-06-27 ENCOUNTER — Ambulatory Visit (INDEPENDENT_AMBULATORY_CARE_PROVIDER_SITE_OTHER): Payer: Medicare Other | Admitting: Psychiatry

## 2018-06-27 VITALS — BP 126/66 | HR 80 | Temp 98.0°F | Wt 255.2 lb

## 2018-06-27 DIAGNOSIS — F330 Major depressive disorder, recurrent, mild: Secondary | ICD-10-CM

## 2018-06-27 DIAGNOSIS — F4312 Post-traumatic stress disorder, chronic: Secondary | ICD-10-CM

## 2018-06-27 MED ORDER — PAROXETINE HCL 40 MG PO TABS: 40.0000 mg | ORAL_TABLET | ORAL | 1 refills | Status: DC

## 2018-06-27 MED ORDER — PAROXETINE HCL 20 MG PO TABS
20.0000 mg | ORAL_TABLET | Freq: Every day | ORAL | 1 refills | Status: DC
Start: 2018-06-27 — End: 2020-03-19

## 2018-06-27 NOTE — Progress Notes (Signed)
[facility] MD OP Progress Note  06/27/2018 3:19 PM [name]  MRN:  [id number]  Chief Complaint: ' I am here for follow up." Chief Complaint    Follow-up; Medication Refill     HPI: [name] is a 72 year old Caucasian male, widowed, lives in [place], has a history of CVA, CAD, hypertension, hyperlipidemia, BPH, vitamin B12 deficiency, peripheral neuropathy, RLS, ulcer on left foot, grief reaction, presented to the clinic today for a follow-up visit.  Patient today reports he is currently doing well on the current medication regimen.  He is tolerating his Paxil well.  He denies any significant mood symptoms.  He continues to miss his wife who passed away.  He however reports he is coping better.  He continues to be in psychotherapy with Elmyra Ricks which he reports is going well.  His family continues to be supportive.  Patient reports sleep is fair on the Pamelor as well as requip at  bedtime.  Both these medications were initiated by his PMD.  Patient denies any suicidality, homicidality or perceptual disturbances.   Visit Diagnosis:    ICD-10-CM   1. MDD (major depressive disorder), recurrent episode, mild ([place]) F33.0   2. Chronic post-traumatic stress disorder (PTSD) F43.12     Past Psychiatric History: Reviewed past psychiatric history from my progress note on 03/01/2018.  Past Medical History:  Past Medical History:  Diagnosis Date  . BPH (benign prostatic hyperplasia)   . Depression   . Diabetes mellitus without complication ([place])   . HLD (hyperlipidemia)   . Hypertension   . Neuropathy   . PTSD (post-traumatic stress disorder)   . Stroke [facility])     Past Surgical History:  Procedure Laterality Date  . BYPASS GRAFT    . CARDIAC CATHETERIZATION Left 10/28/2016   Procedure: Left Heart Cath and Coronary Angiography;  Surgeon: Corey Skains, MD;  Location: [facility];  Service: Cardiovascular;  Laterality: Left;  . SHOULDER ARTHROSCOPY    .  UVULOPALATOPHARYNGOPLASTY, TONSILLECTOMY AND SEPTOPLASTY      Family Psychiatric History: Reviewed family psychiatric history from my progress note on 03/01/2018.  Family History:  Family History  Problem Relation Age of Onset  . Hypertension Unknown   . Diabetes Mellitus II Unknown   . Diabetes Mother   . Diabetes Sister    Substance abuse history: Denies  Social History: Reviewed social history from my progress note on 03/01/2018. Social History   Socioeconomic History  . Marital status: Widowed    Spouse name: Not on file  . Number of children: 2  . Years of education: Not on file  . Highest education level: Some college, no degree  Occupational History    Comment: retired  Scientific laboratory technician  . Financial resource strain: Not hard at all  . Food insecurity:    Worry: Never true    Inability: Never true  . Transportation needs:    Medical: No    Non-medical: No  Tobacco Use  . Smoking status: Never Smoker  . Smokeless tobacco: Never Used  Substance and Sexual Activity  . Alcohol use: No    Alcohol/week: 0.0 oz  . Drug use: No  . Sexual activity: Not Currently  Lifestyle  . Physical activity:    Days per week: 0 days    Minutes per session: 0 min  . Stress: Not at all  Relationships  . Social connections:    Talks on phone: Not on file    Gets together: Not on file  Attends religious service: Never    Active member of club or organization: No    Attends meetings of clubs or organizations: Never    Relationship status: Widowed  Other Topics Concern  . Not on file  Social History Narrative  . Not on file    Allergies: No Known Allergies  Metabolic Disorder Labs: Lab Results  Component Value Date   HGBA1C 13.3 (H) 06/27/2016   No results found for: PROLACTIN Lab Results  Component Value Date   CHOL 200 06/28/2016   TRIG 515 (H) 06/28/2016   HDL 29 (L) 06/28/2016   CHOLHDL 6.9 06/28/2016   VLDL UNABLE TO CALCULATE IF TRIGLYCERIDE OVER 400 mg/dL  06/28/2016   LDLCALC UNABLE TO CALCULATE IF TRIGLYCERIDE OVER 400 mg/dL 06/28/2016   No results found for: TSH  Therapeutic Level Labs: No results found for: LITHIUM No results found for: VALPROATE No components found for:  CBMZ  Current Medications: Current Outpatient Medications  Medication Sig Dispense Refill  . aspirin 81 MG chewable tablet Chew 1 tablet (81 mg total) by mouth before cath procedure. 90 tablet 4  . cholecalciferol (VITAMIN D) 1000 UNITS tablet Take 1,000 Units by mouth daily.    . Continuous Blood Gluc Sensor (FREESTYLE LIBRE 14 DAY SENSOR) MISC Use 1 kit every 14 (fourteen) days    . finasteride (PROSCAR) 5 MG tablet Take 5 mg by mouth daily.    Marland Kitchen gabapentin (NEURONTIN) 100 MG capsule Take by mouth.    . insulin aspart (NOVOLOG) 100 UNIT/ML injection Inject 20 Units into the skin 3 (three) times daily before meals.    . insulin aspart (NOVOLOG) 100 UNIT/ML injection Inject into the skin.    Marland Kitchen insulin glargine (LANTUS) 100 UNIT/ML injection Inject 75 Units into the skin at bedtime.    Marland Kitchen lisinopril (PRINIVIL,ZESTRIL) 20 MG tablet Take 20 mg by mouth daily.    . metFORMIN (GLUCOPHAGE) 850 MG tablet Take 850 mg by mouth 3 (three) times daily.    . nortriptyline (PAMELOR) 10 MG capsule Take 30 mg by mouth at bedtime. Patient taking differently    . PARoxetine (PAXIL) 20 MG tablet Take 1 tablet (20 mg total) by mouth daily. To be taken with 40 mg 90 tablet 1  . PARoxetine (PAXIL) 40 MG tablet Take 1 tablet (40 mg total) by mouth every morning. To be taken with 20 mg 90 tablet 1  . prazosin (MINIPRESS) 5 MG capsule Take 5 mg by mouth 2 (two) times daily.     Marland Kitchen rOPINIRole (REQUIP) 0.5 MG tablet Take 0.5 mg by mouth at bedtime.    . simvastatin (ZOCOR) 40 MG tablet Take 40 mg by mouth at bedtime.    . sucralfate (CARAFATE) 1 g tablet Take 1 tablet (1 g total) by mouth 4 (four) times daily. 60 tablet 0  . tamsulosin (FLOMAX) 0.4 MG CAPS capsule Take 0.4 mg by mouth daily.     Marland Kitchen tiZANidine (ZANAFLEX) 2 MG tablet Take 2 mg by mouth at bedtime.    . traMADol-acetaminophen (ULTRACET) 37.5-325 MG tablet Take 1 tablet by mouth every 6 (six) hours as needed.     . vitamin B-12 (CYANOCOBALAMIN) 1000 MCG tablet Take 1,000 mcg by mouth at bedtime.     No current facility-administered medications for this visit.      Musculoskeletal: Strength & Muscle Tone: within normal limits Gait & Station: walks with cane Patient leans: N/A  Psychiatric Specialty Exam: Review of Systems  Psychiatric/Behavioral: Positive for depression (improving).  All other systems reviewed and are negative.   Blood pressure 126/66, pulse 80, temperature 98 F (36.7 C), temperature source Oral, weight 255 lb 3.2 oz (115.8 kg).Body mass index is 34.6 kg/m.  General Appearance: Casual  Eye Contact:  Fair  Speech:  Clear and Coherent  Volume:  Normal  Mood:  Dysphoric improving  Affect:  Congruent  Thought Process:  Goal Directed and Descriptions of Associations: Intact  Orientation:  Full (Time, Place, and Person)  Thought Content: Logical   Suicidal Thoughts:  No  Homicidal Thoughts:  No  Memory:  Immediate;   Fair Recent;   Fair Remote;   Fair  Judgement:  Fair  Insight:  Fair  Psychomotor Activity:  Normal  Concentration:  Concentration: Fair and Attention Span: Fair  Recall:  [employer] of Knowledge: Fair  Language: Fair  Akathisia:  No  Handed:  Right  AIMS (if indicated): na  Assets:  Communication Skills Desire for Improvement Social Support  ADL's:  Intact  Cognition: WNL  Sleep:  Fair   Screenings: PHQ2-9     Cardiac Rehab from 04/05/2017 in [facility] Cardiac Rehab from 02/11/2017 in [facility] Nutrition from 09/17/2016 in [place]  PHQ-2 Total Score  0  3  1  PHQ-9 Total Score  5  14  -       Assessment and Plan: [name] is a 72 year old male who has a history of depression, PTSD, multiple  medical problems including CVA, CAD, hypertension, hyperlipidemia, BPH, vitamin B12 deficiency, peripheral neuropathy, RLS, ulcer on left foot, grief reaction, mood lability, presented to the clinic today for a follow-up visit.  Patient continues to improve on the current medication regimen as well as reports good benefit from psychotherapy.  Plan as noted below.  Plan MDD Continue Paxil 60 mg p.o. daily.  For history of chronic PTSD Patient will continue Paxil as well as CBT.  Follow up in clinic in 3 months or sooner if needed.  More than 50 % of the time was spent for psychoeducation and supportive psychotherapy and care coordination.  This note was generated in part or whole with voice recognition software. Voice recognition is usually quite accurate but there are transcription errors that can and very often do occur. I apologize for any typographical errors that were not detected and corrected.         Ursula Alert, MD 06/27/2018, 3:19 PM

## 2018-07-11 ENCOUNTER — Ambulatory Visit: Payer: Medicare Other | Admitting: Licensed Clinical Social Worker

## 2018-09-06 DIAGNOSIS — R9431 Abnormal electrocardiogram [ECG] [EKG]: Secondary | ICD-10-CM | POA: Insufficient documentation

## 2018-09-22 DIAGNOSIS — I119 Hypertensive heart disease without heart failure: Secondary | ICD-10-CM | POA: Insufficient documentation

## 2018-12-12 ENCOUNTER — Other Ambulatory Visit: Payer: Self-pay | Admitting: Physician Assistant

## 2018-12-12 ENCOUNTER — Encounter: Payer: Medicare Other | Attending: Physician Assistant | Admitting: Physician Assistant

## 2018-12-12 DIAGNOSIS — Z8673 Personal history of transient ischemic attack (TIA), and cerebral infarction without residual deficits: Secondary | ICD-10-CM | POA: Diagnosis not present

## 2018-12-12 DIAGNOSIS — E11621 Type 2 diabetes mellitus with foot ulcer: Secondary | ICD-10-CM | POA: Diagnosis present

## 2018-12-12 DIAGNOSIS — L97522 Non-pressure chronic ulcer of other part of left foot with fat layer exposed: Secondary | ICD-10-CM | POA: Insufficient documentation

## 2018-12-12 DIAGNOSIS — Z8249 Family history of ischemic heart disease and other diseases of the circulatory system: Secondary | ICD-10-CM | POA: Diagnosis not present

## 2018-12-12 DIAGNOSIS — Z823 Family history of stroke: Secondary | ICD-10-CM | POA: Insufficient documentation

## 2018-12-12 DIAGNOSIS — I10 Essential (primary) hypertension: Secondary | ICD-10-CM | POA: Diagnosis not present

## 2018-12-12 DIAGNOSIS — E1140 Type 2 diabetes mellitus with diabetic neuropathy, unspecified: Secondary | ICD-10-CM | POA: Insufficient documentation

## 2018-12-12 DIAGNOSIS — S91302A Unspecified open wound, left foot, initial encounter: Secondary | ICD-10-CM

## 2018-12-13 NOTE — Progress Notes (Signed)
[name], [name] ([id number]) Visit Report for 12/12/2018 Abuse/Suicide Risk Screen Details Patient Name: [name], [name]. Date of Service: 12/12/2018 1:00 PM Medical Record Number: [id number] Patient Account Number: [id number] Date of Birth/Sex: Nov 11, 1946 (72 y.o. M) Treating RN: Secundino Ginger Primary Care Kelin Nixon: Glendon Axe Other Clinician: Referring Pascuala Klutts: Gabriel Carina, ANNA Treating Abelardo Seidner/Extender: STONE III, HOYT Weeks in Treatment: 0 Abuse/Suicide Risk Screen Items Answer ABUSE/SUICIDE RISK SCREEN: Has anyone close to you tried to hurt or harm you recentlyo No Do you feel uncomfortable with anyone in your familyo No Has anyone forced you do things that you didnot want to doo No Do you have any thoughts of harming yourselfo No Patient displays signs or symptoms of abuse and/or neglect. No Electronic Signature(s) Signed: 12/12/2018 4:23:58 PM By: Secundino Ginger Entered By: Secundino Ginger on 12/12/2018 14:02:24 [name] ([id number]) -------------------------------------------------------------------------------- Activities of Daily Living Details Patient Name: [name], [name]. Date of Service: 12/12/2018 1:00 PM Medical Record Number: [id number] Patient Account Number: [id number] Date of Birth/Sex: Mar 04, 1946 (72 y.o. M) Treating RN: Secundino Ginger Primary Care Remedy Corporan: Glendon Axe Other Clinician: Referring Marilin Kofman: Gabriel Carina, ANNA Treating Eartha Vonbehren/Extender: STONE III, HOYT Weeks in Treatment: 0 Activities of Daily Living Items Answer Activities of Daily Living (Please select one for each item) Drive Automobile Completely Able Take Medications Completely Able Use Telephone Completely Able Care for Appearance Completely Able Use Toilet Completely Able Bath / Shower Completely Able Dress Self Completely Able Feed Self Completely Able Walk Completely Able Get In / Out Bed Completely Able Housework Completely Able Prepare Meals Completely  [place] for Self Completely Able Electronic Signature(s) Signed: 12/12/2018 4:23:58 PM By: Secundino Ginger Entered By: Secundino Ginger on 12/12/2018 14:02:58 [name] ([id number]) -------------------------------------------------------------------------------- Education Assessment Details Patient Name: [name] Date of Service: 12/12/2018 1:00 PM Medical Record Number: [id number] Patient Account Number: [id number] Date of Birth/Sex: March 19, 1946 (72 y.o. M) Treating RN: Secundino Ginger Primary Care Laurynn Mccorvey: Glendon Axe Other Clinician: Referring Sylvain Hasten: Gabriel Carina, ANNA Treating Kataleya Zaugg/Extender: Melburn Hake, HOYT Weeks in Treatment: 0 Learning Preferences/Education Level/Primary Language Learning Preference: Explanation, Demonstration Highest Education Level: College or Above Cognitive Barrier Assessment/Beliefs Language Barrier: No Translator Needed: No Memory Deficit: No Emotional Barrier: No Cultural/Religious Beliefs Affecting Medical Care: No Physical Barrier Assessment Impaired Vision: No Impaired Hearing: No Decreased Hand dexterity: No Knowledge/Comprehension Assessment Knowledge Level: High Comprehension Level: High Ability to understand written High instructions: Ability to understand verbal High instructions: Motivation Assessment Anxiety Level: Calm Cooperation: Cooperative Education Importance: Acknowledges Need Interest in Health Problems: Asks Questions Perception: Coherent Willingness to Engage in Self- High Management Activities: Readiness to Engage in Self- High Management Activities: Electronic Signature(s) Signed: 12/12/2018 4:23:58 PM By: Secundino Ginger Entered By: Secundino Ginger on 12/12/2018 14:03:34 [name], [name] ([id number]) -------------------------------------------------------------------------------- Fall Risk Assessment Details Patient Name: [name] Date of Service:  12/12/2018 1:00 PM Medical Record Number: [id number] Patient Account Number: [id number] Date of Birth/Sex: 03-03-1946 (72 y.o. M) Treating RN: Secundino Ginger Primary Care Haleemah Buckalew: Glendon Axe Other Clinician: Referring Yakov Bergen: Gabriel Carina, ANNA Treating Caidyn Henricksen/Extender: Melburn Hake, HOYT Weeks in Treatment: 0 Fall Risk Assessment Items Have you had 2 or more falls in the last 12 monthso 0 No Have you had any fall that resulted in injury in the last 12 monthso 0 No FALL RISK ASSESSMENT: History of falling - immediate or within 3 months 0 No Secondary diagnosis 0 No Ambulatory aid None/bed rest/wheelchair/nurse 0 No Crutches/cane/walker 0 No Furniture 0 No IV Access/Saline Lock 0 No  Gait/Training Normal/bed rest/immobile 0 No Weak 0 No Impaired 0 No Mental Status Oriented to own ability 0 No Notes uses cane at times. Electronic Signature(s) Signed: 12/12/2018 4:23:58 PM By: Secundino Ginger Entered By: Secundino Ginger on 12/12/2018 14:04:41 [name], [name] ([id number]) -------------------------------------------------------------------------------- Foot Assessment Details Patient Name: [name] Date of Service: 12/12/2018 1:00 PM Medical Record Number: [id number] Patient Account Number: [id number] Date of Birth/Sex: June 26, 1946 (72 y.o. M) Treating RN: Secundino Ginger Primary Care Jaki Steptoe: Glendon Axe Other Clinician: Referring Kayti Poss: Gabriel Carina, ANNA Treating Reyann Troop/Extender: STONE III, HOYT Weeks in Treatment: 0 Foot Assessment Items Site Locations + = Sensation present, - = Sensation absent, C = Callus, U = Ulcer R = Redness, W = Warmth, M = Maceration, PU = Pre-ulcerative lesion F = Fissure, S = Swelling, D = Dryness Assessment Right: Left: Other Deformity: No No Prior Foot Ulcer: No No Prior Amputation: No No Charcot Joint: No No Ambulatory Status: Gait: Electronic Signature(s) Signed: 12/12/2018 4:23:58 PM By: Secundino Ginger Entered By: Secundino Ginger on 12/12/2018  14:06:50 [name], [name] ([id number]) -------------------------------------------------------------------------------- Nutrition Risk Assessment Details Patient Name: [name] Date of Service: 12/12/2018 1:00 PM Medical Record Number: [id number] Patient Account Number: [id number] Date of Birth/Sex: 10/31/1946 (72 y.o. M) Treating RN: Secundino Ginger Primary Care Siddh Vandeventer: Glendon Axe Other Clinician: Referring Galena Logie: Gabriel Carina, ANNA Treating Wissam Resor/Extender: STONE III, HOYT Weeks in Treatment: 0 Height (in): 72 Weight (lbs): 250 Body Mass Index (BMI): 33.9 Nutrition Risk Assessment Items NUTRITION RISK SCREEN: I have an illness or condition that made me change the kind and/or amount of 0 No food I eat I eat fewer than two meals per day 0 No I eat few fruits and vegetables, or milk products 0 No I have three or more drinks of beer, liquor or wine almost every day 0 No I have tooth or mouth problems that make it hard for me to eat 0 No I don't always have enough money to buy the food I need 0 No I eat alone most of the time 0 No I take three or more different prescribed or over-the-counter drugs a day 0 No Without wanting to, I have lost or gained 10 pounds in the last six months 0 No I am not always physically able to shop, cook and/or feed myself 0 No Nutrition Protocols Good Risk Protocol Moderate Risk Protocol Electronic Signature(s) Signed: 12/12/2018 4:23:58 PM By: Secundino Ginger Entered By: Secundino Ginger on 12/12/2018 14:04:55

## 2018-12-19 ENCOUNTER — Encounter: Payer: Medicare Other | Admitting: Physician Assistant

## 2018-12-19 DIAGNOSIS — E11621 Type 2 diabetes mellitus with foot ulcer: Secondary | ICD-10-CM | POA: Diagnosis not present

## 2018-12-22 ENCOUNTER — Ambulatory Visit
Admission: RE | Admit: 2018-12-22 | Discharge: 2018-12-22 | Disposition: A | Discharge status: Home / Self Care | Payer: Medicare Other | Source: Ambulatory Visit | Attending: Physician Assistant | Admitting: Physician Assistant

## 2018-12-22 ENCOUNTER — Ambulatory Visit
Admission: RE | Admit: 2018-12-22 | Discharge: 2018-12-22 | Disposition: A | Discharge status: Home / Self Care | Payer: Medicare Other | Attending: Physician Assistant | Admitting: Physician Assistant

## 2018-12-22 DIAGNOSIS — S91302A Unspecified open wound, left foot, initial encounter: Secondary | ICD-10-CM | POA: Diagnosis present

## 2018-12-23 NOTE — Progress Notes (Signed)
[name], [name] ([id number]) Visit Report for 12/19/2018 Arrival Information Details Patient Name: [name], [name]. Date of Service: 12/19/2018 12:30 PM Medical Record Number: [id number] Patient Account Number: [id number] Date of Birth/Sex: 1946-03-21 (72 y.o. Male) Treating RN: Montey Hora Primary Care [place]ika Bowlds: Glendon Axe Other Clinician: Referring Kaevion Sinclair: Glendon Axe Treating Nautia Lem/Extender: STONE III, HOYT Weeks in Treatment: 1 Visit Information History Since Last Visit Added or deleted any medications: No Patient Arrived: Cane Any new allergies or adverse reactions: No Arrival Time: 12:54 Had a fall or experienced change in No Accompanied By: self activities of daily living that may affect Transfer Assistance: None risk of falls: Patient Identification Verified: Yes Signs or symptoms of abuse/neglect since last visito No Secondary Verification Process Completed: Yes Hospitalized since last visit: No Patient Has Alerts: Yes Implantable device outside of the clinic excluding No Patient Alerts: DMII cellular tissue based products placed in the center since last visit: Has Dressing in Place as Prescribed: Yes Pain Present Now: No Electronic Signature(s) Signed: 12/19/2018 4:55:08 PM By: Montey Hora Entered By: Montey Hora on 12/19/2018 12:54:56 Demarco, [name] ([id number]) -------------------------------------------------------------------------------- Encounter Discharge Information Details Patient Name: [name]. Date of Service: 12/19/2018 12:30 PM Medical Record Number: [id number] Patient Account Number: [id number] Date of Birth/Sex: [birth year] (72 y.o. Male) Treating RN: Cornell Barman Primary Care Keyaria Lawson: Glendon Axe Other Clinician: Referring Christian Treadway: Glendon Axe Treating Caedan Sumler/Extender: STONE III, HOYT Weeks in Treatment: 1 Encounter Discharge Information Items Post Procedure Vitals Discharge  Condition: Stable Temperature (F): 98.0 Ambulatory Status: Cane Pulse (bpm): 75 Discharge Destination: Home Respiratory Rate (breaths/min): 16 Transportation: Private Auto Blood Pressure (mmHg): 142/75 Accompanied By: self Schedule Follow-up Appointment: Yes Clinical Summary of Care: Electronic Signature(s) Signed: 12/20/2018 1:27:23 PM By: Gretta Cool, BSN, RN, CWS, Kim RN, BSN Entered By: Gretta Cool, BSN, RN, CWS, Kim on 12/19/2018 13:20:04 [name] ([id number]) -------------------------------------------------------------------------------- Lower Extremity Assessment Details Patient Name: [name]. Date of Service: 12/19/2018 12:30 PM Medical Record Number: [id number] Patient Account Number: [id number] Date of Birth/Sex: July 26, 1946 (72 y.o. Male) Treating RN: Montey Hora Primary Care Brownie Gockel: Glendon Axe Other Clinician: Referring Alannie Amodio: Glendon Axe Treating Markeshia Giebel/Extender: STONE III, HOYT Weeks in Treatment: 1 Vascular Assessment Pulses: Dorsalis Pedis Palpable: [Left:Yes] Posterior Tibial Extremity colors, hair growth, and conditions: Extremity Color: [Left:Hyperpigmented] Hair Growth on Extremity: [Left:No] Temperature of Extremity: [Left:Cool] Capillary Refill: [Left:< 3 seconds] Toe Nail Assessment Left: Right: Thick: Yes Discolored: Yes Deformed: No Improper Length and Hygiene: No Electronic Signature(s) Signed: 12/19/2018 4:55:08 PM By: Montey Hora Entered By: Montey Hora on 12/19/2018 13:01:10 [name] ([id number]) -------------------------------------------------------------------------------- Multi Wound Chart Details Patient Name: [name]. Date of Service: 12/19/2018 12:30 PM Medical Record Number: [id number] Patient Account Number: [id number] Date of Birth/Sex: 09-13-1946 (71 y.o. Male) Treating RN: Harold Barban Primary Care Misbah Hornaday: Glendon Axe Other Clinician: Referring  Journi Moffa: Glendon Axe Treating Jozey Janco/Extender: STONE III, HOYT Weeks in Treatment: 1 Vital Signs Height(in): 72 Pulse(bpm): 75 Weight(lbs): 250 Blood Pressure(mmHg): 143/75 Body Mass Index(BMI): 34 Temperature(F): 98.0 Respiratory Rate 16 (breaths/min): Photos: [1:No Photos] [N/A:N/A] Wound Location: [1:Left Toe Great] [N/A:N/A] Wounding Event: [1:Gradually Appeared] [N/A:N/A] Primary Etiology: [1:Diabetic Wound/Ulcer of the N/A Lower Extremity] Comorbid History: [1:Hypertension, Type II Diabetes N/A] Date Acquired: [1:11/30/2017] [N/A:N/A] Weeks of Treatment: [1:1] [N/A:N/A] Wound Status: [1:Open] [N/A:N/A] Measurements L x W x D [1:0.5x0.5x0.1] [N/A:N/A] (cm) Area (cm) : [1:0.196] [N/A:N/A] Volume (cm) : [1:0.02] [N/A:N/A] % Reduction in Area: [1:69.20%] [N/A:N/A] % Reduction in Volume: [1:68.80%] [N/A:N/A] Classification: [1:Grade 2] [N/A:N/A] Exudate Amount: [1:Medium] [  N/A:N/A] Exudate Type: [1:Serous] [N/A:N/A] Exudate Color: [1:amber] [N/A:N/A] Wound Margin: [1:Thickened] [N/A:N/A] Granulation Amount: [1:Large (67-100%)] [N/A:N/A] Granulation Quality: [1:Red] [N/A:N/A] Necrotic Amount: [1:Small (1-33%)] [N/A:N/A] Exposed Structures: [1:Fat Layer (Subcutaneous Tissue) Exposed: Yes Fascia: No Tendon: No Muscle: No Joint: No Bone: No] [N/A:N/A] Epithelialization: [1:None] [N/A:N/A] Periwound Skin Texture: [1:Callus: Yes Excoriation: No Induration: No Crepitus: No Rash: No Scarring: No] [N/A:N/A] Periwound Skin Moisture: Maceration: Yes N/A N/A Dry/Scaly: No Periwound Skin Color: Atrophie Blanche: No N/A N/A Cyanosis: No Ecchymosis: No Erythema: No Hemosiderin Staining: No Mottled: No Pallor: No Rubor: No Temperature: No Abnormality N/A N/A Tenderness on Palpation: Yes N/A N/A Wound Preparation: Ulcer Cleansing: N/A N/A Rinsed/Irrigated with Saline Topical Anesthetic Applied: Other: lidocaine 4% Treatment Notes Electronic Signature(s) Signed:  12/20/2018 1:37:43 PM By: Harold Barban Entered By: Harold Barban on 12/19/2018 13:08:16 [name], [name] ([id number]) -------------------------------------------------------------------------------- [place] Details Patient Name: [name] Date of Service: 12/19/2018 12:30 PM Medical Record Number: [id number] Patient Account Number: [id number] Date of Birth/Sex: 1946-11-21 (72 y.o. Male) Treating RN: Harold Barban Primary Care Lucilia Yanni: Glendon Axe Other Clinician: Referring Tiaira Arambula: Glendon Axe Treating Marrion Accomando/Extender: STONE III, HOYT Weeks in Treatment: 1 Active Inactive Wound/Skin Impairment Nursing Diagnoses: Impaired tissue integrity Goals: Ulcer/skin breakdown will have a volume reduction of 30% by week 4 Date Initiated: 12/12/2018 Target Resolution Date: 01/12/2019 Goal Status: Active Interventions: Assess patient/caregiver ability to obtain necessary supplies Assess patient/caregiver ability to perform ulcer/skin care regimen upon admission and as needed Notes: Electronic Signature(s) Signed: 12/20/2018 1:37:43 PM By: Harold Barban Entered By: Harold Barban on 12/19/2018 13:08:09 [name] ([id number]) -------------------------------------------------------------------------------- Pain Assessment Details Patient Name: [name]. Date of Service: 12/19/2018 12:30 PM Medical Record Number: [id number] Patient Account Number: [id number] Date of Birth/Sex: Nov 27, 1946 (72 y.o. Male) Treating RN: Montey Hora Primary Care Dashawn Bartnick: Glendon Axe Other Clinician: Referring Esteven Overfelt: Glendon Axe Treating Derrell Milanes/Extender: STONE III, HOYT Weeks in Treatment: 1 Active Problems Location of Pain Severity and Description of Pain Patient Has Paino No Site Locations Pain Management and Medication Current Pain Management: Electronic Signature(s) Signed: 12/19/2018 4:55:08 PM By: Montey Hora Entered By: Montey Hora on 12/19/2018 12:55:03 [name] ([id number]) -------------------------------------------------------------------------------- Patient/Caregiver Education Details Patient Name: [name]. Date of Service: 12/19/2018 12:30 PM Medical Record Number: [id number] Patient Account Number: [id number] Date of Birth/Gender: [birth year] (72 y.o. Male) Treating RN: Harold Barban Primary Care Physician: Glendon Axe Other Clinician: Referring Physician: Glendon Axe Treating Physician/Extender: Sharalyn Ink in Treatment: 1 Education Assessment Education Provided To: Patient Education Topics Provided Electronic Signature(s) Signed: 12/20/2018 1:37:43 PM By: Harold Barban Entered By: Harold Barban on 12/19/2018 13:08:26 [name] ([id number]) -------------------------------------------------------------------------------- Wound Assessment Details Patient Name: [name]. Date of Service: 12/19/2018 12:30 PM Medical Record Number: [id number] Patient Account Number: [id number] Date of Birth/Sex: 1946/01/25 (72 y.o. Male) Treating RN: Montey Hora Primary Care Denese Mentink: Glendon Axe Other Clinician: Referring Marlen Mollica: Glendon Axe Treating Carmichael Burdette/Extender: STONE III, HOYT Weeks in Treatment: 1 Wound Status Wound Number: 1 Primary Etiology: Diabetic Wound/Ulcer of the Lower Extremity Wound Location: Left Toe Great Wound Status: Open Wounding Event: Gradually Appeared Comorbid Hypertension, Type II Diabetes Date Acquired: 11/30/2017 History: Weeks Of Treatment: 1 Clustered Wound: No Photos Photo Uploaded By: Montey Hora on 12/19/2018 14:22:45 Wound Measurements Length: (cm) 0.5 Width: (cm) 0.5 Depth: (cm) 0.1 Area: (cm) 0.196 Volume: (cm) 0.02 % Reduction in Area: 69.2% % Reduction in Volume: 68.8% Epithelialization: None Tunneling: No Undermining: No Wound  Description Classification: Grade 2 Foul  O Wound Margin: Thickened Slough Exudate Amount: Medium Exudate Type: Serous Exudate Color: amber dor After Cleansing: No /Fibrino Yes Wound Bed Granulation Amount: Large (67-100%) Exposed Structure Granulation Quality: Red Fascia Exposed: No Necrotic Amount: Small (1-33%) Fat Layer (Subcutaneous Tissue) Exposed: Yes Necrotic Quality: Adherent Slough Tendon Exposed: No Muscle Exposed: No Joint Exposed: No Bone Exposed: No Periwound Skin Texture Bares, Wing L. ([id number]) Texture Color No Abnormalities Noted: No No Abnormalities Noted: No Callus: Yes Atrophie Blanche: No Crepitus: No Cyanosis: No Excoriation: No Ecchymosis: No Induration: No Erythema: No Rash: No Hemosiderin Staining: No Scarring: No Mottled: No Pallor: No Moisture Rubor: No No Abnormalities Noted: No Dry / Scaly: No Temperature / Pain Maceration: Yes Temperature: No Abnormality Tenderness on Palpation: Yes Wound Preparation Ulcer Cleansing: Rinsed/Irrigated with Saline Topical Anesthetic Applied: Other: lidocaine 4%, Treatment Notes Wound #1 (Left Toe Great) 4. Dressing Applied: Hydrafera Blue Notes Coverlet Electronic Signature(s) Signed: 12/19/2018 4:55:08 PM By: Montey Hora Entered By: Montey Hora on 12/19/2018 13:00:12 [name] ([id number]) -------------------------------------------------------------------------------- [place] Details Patient Name: [name] Date of Service: 12/19/2018 12:30 PM Medical Record Number: [id number] Patient Account Number: [id number] Date of Birth/Sex: 10/28/1946 (72 y.o. Male) Treating RN: Montey Hora Primary Care Malichi Palardy: Glendon Axe Other Clinician: Referring Shanikka Wonders: Glendon Axe Treating Giovan Pinsky/Extender: STONE III, HOYT Weeks in Treatment: 1 Vital Signs Time Taken: 12:55 Temperature (F): 98.0 Height (in): 72 Pulse (bpm): 75 Weight (lbs):  250 Respiratory Rate (breaths/min): 16 Body Mass Index (BMI): 33.9 Blood Pressure (mmHg): 143/75 Reference Range: 80 - 120 mg / dl Electronic Signature(s) Signed: 12/19/2018 4:55:08 PM By: Montey Hora Entered By: Montey Hora on 12/19/2018 12:57:55

## 2018-12-23 NOTE — Progress Notes (Signed)
Fred, [name] ([id number]) Visit Report for 12/19/2018 Chief Complaint Document Details Patient Name: [name], [name]. Date of Service: 12/19/2018 12:30 PM Medical Record Number: [id number] Patient Account Number: [id number] Date of Birth/Sex: [birth year] (72 y.o. Male) Treating RN: Harold Barban Primary Care Provider: Glendon Axe Other Clinician: Referring Provider: Glendon Axe Treating Provider/Extender: STONE III, HOYT Weeks in Treatment: 1 Information Obtained from: Patient Chief Complaint Left great toe ulcer Electronic Signature(s) Signed: 12/22/2018 10:16:39 PM By: Worthy Keeler PA-C Entered By: Worthy Keeler on 12/19/2018 12:53:33 [place], [name] ([id number]) -------------------------------------------------------------------------------- Debridement Details Patient Name: [name]. Date of Service: 12/19/2018 12:30 PM Medical Record Number: [id number] Patient Account Number: [id number] Date of Birth/Sex: Jan 15, 1946 (72 y.o. Male) Treating RN: Harold Barban Primary Care Provider: Glendon Axe Other Clinician: Referring Provider: Glendon Axe Treating Provider/Extender: STONE III, HOYT Weeks in Treatment: 1 Debridement Performed for Wound #1 Left Toe Great Assessment: Performed By: Physician STONE III, HOYT E., PA-C Debridement Type: Debridement Severity of Tissue Pre Fat layer exposed Debridement: Level of Consciousness (Pre- Awake and Alert procedure): Pre-procedure Verification/Time Yes - 13:08 Out Taken: Start Time: 13:08 Pain Control: Lidocaine Total Area Debrided (L x W): 0.5 (cm) x 0.5 (cm) = 0.25 (cm) Tissue and other material Viable, Callus, Slough, Subcutaneous, Slough debrided: Level: Skin/Subcutaneous Tissue Debridement Description: Excisional Instrument: Curette Bleeding: Minimum Hemostasis Achieved: Pressure End Time: 13:14 Procedural Pain: 0 Post Procedural Pain: 0 Response to Treatment:  Procedure was tolerated well Level of Consciousness Awake and Alert (Post-procedure): Post Debridement Measurements of Total Wound Length: (cm) 0.5 Width: (cm) 0.5 Depth: (cm) 0.2 Volume: (cm) 0.039 Character of Wound/Ulcer Post Debridement: Improved Severity of Tissue Post Debridement: Fat layer exposed Post Procedure Diagnosis Same as Pre-procedure Electronic Signature(s) Signed: 12/20/2018 1:37:43 PM By: Harold Barban Signed: 12/22/2018 10:16:39 PM By: Worthy Keeler PA-C Entered By: Harold Barban on 12/19/2018 13:11:38 [place], [name] ([id number]) -------------------------------------------------------------------------------- HPI Details Patient Name: [name] Date of Service: 12/19/2018 12:30 PM Medical Record Number: [id number] Patient Account Number: [id number] Date of Birth/Sex: March 02, 1946 (72 y.o. Male) Treating RN: Harold Barban Primary Care Provider: Glendon Axe Other Clinician: Referring Provider: Glendon Axe Treating Provider/Extender: Melburn Hake, HOYT Weeks in Treatment: 1 History of Present Illness HPI Description: 12/12/18 on evaluation today patient presents as a referral from his endocrinologist regarding issues that he has been having with his left great toe. Subsequently he has been seeing Dr. Cleda Mccreedy and Dr. Cleda Mccreedy has been the breeding the wound and in fact this was debrided this morning. The patient had an appointment there prior to coming here. Subsequently he states that even though he's been going for regular debridement after office things really do not seem to be showing signs of improvement unfortunately. He states that he is having some discomfort but nothing too significant. No fevers, chills, nausea, or vomiting noted at this time. The patient does have a history of hypertension, neuropathy, diabetes mellitus type II, and a history of stroke. Currently he's been using bacitracin on the toe and utilizing a Band-Aid. He  tells me he's had this wound for two years. He cannot remember the last time he had an x-ray. 12/19/18 on evaluation today patient appears to be doing well in regard to his plantar toe ulcer. He's been tolerating the dressing changes without complication. Fortunately there does not appear to be signs of infection at this time which is good news. No fever chills noted. Electronic Signature(s) Signed: 12/22/2018 10:16:39 PM By: Worthy Keeler PA-C Entered  By: Worthy Keeler on 12/19/2018 13:26:19 [place], [name] ([id number]) -------------------------------------------------------------------------------- Physical Exam Details Patient Name: Fred, [name]. Date of Service: 12/19/2018 12:30 PM Medical Record Number: [id number] Patient Account Number: [id number] Date of Birth/Sex: 29-Jun-1946 (72 y.o. Male) Treating RN: Harold Barban Primary Care Provider: Glendon Axe Other Clinician: Referring Provider: Glendon Axe Treating Provider/Extender: STONE III, HOYT Weeks in Treatment: 1 Constitutional Well-nourished and well-hydrated in no acute distress. Respiratory normal breathing without difficulty. clear to auscultation bilaterally. Cardiovascular regular rate and rhythm with normal S1, S2. Psychiatric this patient is able to make decisions and demonstrates good insight into disease process. Alert and Oriented x 3. pleasant and cooperative. Notes Upon inspection patient wound bed did require sharp debridement today to remove necrotic tissue from the surrounding portion of the wound including callous. Slough was also removed from the surface of the wound which he tolerated without complication. Post debridement wound bed appears to be doing much better. Electronic Signature(s) Signed: 12/22/2018 10:16:39 PM By: Worthy Keeler PA-C Entered By: Worthy Keeler on 12/19/2018 13:26:52 [place], [name]  ([id number]) -------------------------------------------------------------------------------- Physician Orders Details Patient Name: [name] Date of Service: 12/19/2018 12:30 PM Medical Record Number: [id number] Patient Account Number: [id number] Date of Birth/Sex: 02/05/1946 (72 y.o. Male) Treating RN: Harold Barban Primary Care Provider: Glendon Axe Other Clinician: Referring Provider: Glendon Axe Treating Provider/Extender: Melburn Hake, HOYT Weeks in Treatment: 1 Verbal / Phone Orders: No Diagnosis Coding ICD-10 Coding Code Description E11.621 Type 2 diabetes mellitus with foot ulcer L97.522 Non-pressure chronic ulcer of other part of left foot with fat layer exposed I10 Essential (primary) hypertension E11.43 Type 2 diabetes mellitus with diabetic autonomic (poly)neuropathy Wound Cleansing Wound #1 Left Toe Great o May Shower, gently pat wound dry prior to applying new dressing. Primary Wound Dressing Wound #1 Left Toe Great o Hydrafera Blue Ready Transfer Secondary Dressing o Other - Felt Dressing Change Frequency Wound #1 Left Toe Great o Change dressing every day. - Change if dressing is excessive Follow-up Appointments Wound #1 Left Toe Great o Return Appointment in: - Monday, January 13th Off-Loading Wound #1 Left Toe Great o Open toe surgical shoe with peg assist. Electronic Signature(s) Signed: 12/20/2018 1:37:43 PM By: Harold Barban Signed: 12/22/2018 10:16:39 PM By: Worthy Keeler PA-C Entered By: Harold Barban on 12/19/2018 13:15:45 [place], [name] ([id number]) -------------------------------------------------------------------------------- Problem List Details Patient Name: [name]. Date of Service: 12/19/2018 12:30 PM Medical Record Number: [id number] Patient Account Number: [id number] Date of Birth/Sex: Mar 18, 1946 (72 y.o. Male) Treating RN: Harold Barban Primary Care Provider: Glendon Axe  Other Clinician: Referring Provider: Glendon Axe Treating Provider/Extender: Melburn Hake, HOYT Weeks in Treatment: 1 Active Problems ICD-10 Evaluated Encounter Code Description Active Date Today Diagnosis E11.621 Type 2 diabetes mellitus with foot ulcer 12/12/2018 No Yes L97.522 Non-pressure chronic ulcer of other part of left foot with fat 12/12/2018 No Yes layer exposed I10 Essential (primary) hypertension 12/12/2018 No Yes E11.43 Type 2 diabetes mellitus with diabetic autonomic (poly) 12/12/2018 No Yes neuropathy Inactive Problems Resolved Problems Electronic Signature(s) Signed: 12/22/2018 10:16:39 PM By: Worthy Keeler PA-C Entered By: Worthy Keeler on 12/19/2018 12:53:17 [place], [name] ([id number]) -------------------------------------------------------------------------------- Progress Note Details Patient Name: [name] Date of Service: 12/19/2018 12:30 PM Medical Record Number: [id number] Patient Account Number: [id number] Date of Birth/Sex: [birth year] (72 y.o. Male) Treating RN: Harold Barban Primary Care Provider: Glendon Axe Other Clinician: Referring Provider: Glendon Axe Treating Provider/Extender: STONE III, HOYT Weeks in Treatment: 1 Subjective Chief Complaint  Information obtained from Patient Left great toe ulcer History of Present Illness (HPI) 12/12/18 on evaluation today patient presents as a referral from his endocrinologist regarding issues that he has been having with his left great toe. Subsequently he has been seeing Dr. Cleda Mccreedy and Dr. Cleda Mccreedy has been the breeding the wound and in fact this was debrided this morning. The patient had an appointment there prior to coming here. Subsequently he states that even though he's been going for regular debridement after office things really do not seem to be showing signs of improvement unfortunately. He states that he is having some discomfort but nothing too significant. No  fevers, chills, nausea, or vomiting noted at this time. The patient does have a history of hypertension, neuropathy, diabetes mellitus type II, and a history of stroke. Currently he's been using bacitracin on the toe and utilizing a Band-Aid. He tells me he's had this wound for two years. He cannot remember the last time he had an x-ray. 12/19/18 on evaluation today patient appears to be doing well in regard to his plantar toe ulcer. He's been tolerating the dressing changes without complication. Fortunately there does not appear to be signs of infection at this time which is good news. No fever chills noted. Patient History Information obtained from Patient. Family History Diabetes - Mother, Heart Disease - Father, Stroke - Father, No family history of Cancer, Hereditary Spherocytosis, Hypertension, Kidney Disease, Lung Disease, Seizures, Thyroid Problems, Tuberculosis. Social History Never smoker, Marital Status - Widowed, Alcohol Use - Never, Drug Use - No History, Caffeine Use - Moderate. Review of Systems (ROS) Constitutional Symptoms (General Health) Denies complaints or symptoms of Fever, Chills. Respiratory The patient has no complaints or symptoms. Cardiovascular The patient has no complaints or symptoms. Psychiatric The patient has no complaints or symptoms. NASSIM, COSMA ([id number]) Objective Constitutional Well-nourished and well-hydrated in no acute distress. Vitals Time Taken: 12:55 PM, Height: 72 in, Weight: 250 lbs, BMI: 33.9, Temperature: 98.0 F, Pulse: 75 bpm, Respiratory Rate: 16 breaths/min, Blood Pressure: 143/75 mmHg. Respiratory normal breathing without difficulty. clear to auscultation bilaterally. Cardiovascular regular rate and rhythm with normal S1, S2. Psychiatric this patient is able to make decisions and demonstrates good insight into disease process. Alert and Oriented x 3. pleasant and cooperative. General Notes: Upon inspection patient  wound bed did require sharp debridement today to remove necrotic tissue from the surrounding portion of the wound including callous. Slough was also removed from the surface of the wound which he tolerated without complication. Post debridement wound bed appears to be doing much better. Integumentary (Hair, Skin) Wound #1 status is Open. Original cause of wound was Gradually Appeared. The wound is located on the Left Toe Great. The wound measures 0.5cm length x 0.5cm width x 0.1cm depth; 0.196cm^2 area and 0.02cm^3 volume. There is Fat Layer (Subcutaneous Tissue) Exposed exposed. There is no tunneling or undermining noted. There is a medium amount of serous drainage noted. The wound margin is thickened. There is large (67-100%) red granulation within the wound bed. There is a small (1-33%) amount of necrotic tissue within the wound bed including Adherent Slough. The periwound skin appearance exhibited: Callus, Maceration. The periwound skin appearance did not exhibit: Crepitus, Excoriation, Induration, Rash, Scarring, Dry/Scaly, Atrophie Blanche, Cyanosis, Ecchymosis, Hemosiderin Staining, Mottled, Pallor, Rubor, Erythema. Periwound temperature was noted as No Abnormality. The periwound has tenderness on palpation. Assessment Active Problems ICD-10 Type 2 diabetes mellitus with foot ulcer Non-pressure chronic ulcer of other part of left foot  with fat layer exposed Essential (primary) hypertension Type 2 diabetes mellitus with diabetic autonomic (poly)neuropathy Procedures Wound #1 ALWALEED, OBESO ([id number]) Pre-procedure diagnosis of Wound #1 is a Diabetic Wound/Ulcer of the Lower Extremity located on the Left Toe Great .Severity of Tissue Pre Debridement is: Fat layer exposed. There was a Excisional Skin/Subcutaneous Tissue Debridement with a total area of 0.25 sq cm performed by STONE III, HOYT E., PA-C. With the following instrument(s): Curette to remove Viable tissue/material.  Material removed includes Callus, Subcutaneous Tissue, and Slough after achieving pain control using Lidocaine. No specimens were taken. A time out was conducted at 13:08, prior to the start of the procedure. A Minimum amount of bleeding was controlled with Pressure. The procedure was tolerated well with a pain level of 0 throughout and a pain level of 0 following the procedure. Post Debridement Measurements: 0.5cm length x 0.5cm width x 0.2cm depth; 0.039cm^3 volume. Character of Wound/Ulcer Post Debridement is improved. Severity of Tissue Post Debridement is: Fat layer exposed. Post procedure Diagnosis Wound #1: Same as Pre-Procedure Plan Wound Cleansing: Wound #1 Left Toe Great: May Shower, gently pat wound dry prior to applying new dressing. Primary Wound Dressing: Wound #1 Left Toe Great: Hydrafera Blue Ready Transfer Secondary Dressing: Other - Felt Dressing Change Frequency: Wound #1 Left Toe Great: Change dressing every day. - Change if dressing is excessive Follow-up Appointments: Wound #1 Left Toe Great: Return Appointment in: - Monday, January 13th Off-Loading: Wound #1 Left Toe Great: Open toe surgical shoe with peg assist. My suggestion currently is gonna be that we go ahead and continue with the above wound care measures for the next week. The patient is in agreement with plan. If anything changes or worsens in the meantime he will contact the office and let me know. Otherwise will see him back when he returns from his trip to [place] that will be sometime after 12 January. Electronic Signature(s) Signed: 12/22/2018 10:16:39 PM By: Worthy Keeler PA-C Entered By: Worthy Keeler on 12/19/2018 13:27:11 [place], [name] ([id number]) -------------------------------------------------------------------------------- ROS/PFSH Details Patient Name: [name] Date of Service: 12/19/2018 12:30 PM Medical Record Number: [id number] Patient Account Number:  [id number] Date of Birth/Sex: 1946/02/14 (72 y.o. Male) Treating RN: Harold Barban Primary Care Provider: Glendon Axe Other Clinician: Referring Provider: Glendon Axe Treating Provider/Extender: STONE III, HOYT Weeks in Treatment: 1 Information Obtained From Patient Wound History Do you currently have one or more open woundso No Have you tested positive for osteomyelitis (bone infection)o No Have you had any tests for circulation on your legso No Constitutional Symptoms (General Health) Complaints and Symptoms: Negative for: Fever; Chills Eyes Medical History: Negative for: Cataracts; Glaucoma; Optic Neuritis Ear/Nose/Mouth/Throat Medical History: Negative for: Chronic sinus problems/congestion; Middle ear problems Hematologic/Lymphatic Medical History: Negative for: Anemia; Hemophilia; Human Immunodeficiency Virus; Lymphedema Respiratory Complaints and Symptoms: No Complaints or Symptoms Medical History: Negative for: Aspiration; Asthma; Chronic Obstructive Pulmonary Disease (COPD); Pneumothorax; Sleep Apnea; Tuberculosis Cardiovascular Complaints and Symptoms: No Complaints or Symptoms Medical History: Positive for: Hypertension Negative for: Angina; Arrhythmia; Congestive Heart Failure; Coronary Artery Disease; Deep Vein Thrombosis; Hypotension; Myocardial Infarction; Peripheral Arterial Disease; Peripheral Venous Disease; Phlebitis; Vasculitis Gastrointestinal JYE, FARISS ([id number]) Medical History: Negative for: Cirrhosis ; Colitis; Crohnos; Hepatitis A; Hepatitis B; Hepatitis C Endocrine Medical History: Positive for: Type II Diabetes Negative for: Type I Diabetes Time with diabetes: 1990 Treated with: Insulin Blood sugar tested every day: Yes Tested : Genitourinary Medical History: Negative for: End Stage Renal Disease  Immunological Medical History: Negative for: Lupus Erythematosus; Raynaudos; Scleroderma Integumentary (Skin) Medical  History: Negative for: History of Burn; History of pressure wounds Musculoskeletal Medical History: Negative for: Gout; Rheumatoid Arthritis; Osteoarthritis; Osteomyelitis Neurologic Medical History: Negative for: Dementia; Neuropathy; Quadriplegia; Paraplegia; Seizure Disorder Oncologic Medical History: Negative for: Received Chemotherapy; Received Radiation Psychiatric Complaints and Symptoms: No Complaints or Symptoms Immunizations Pneumococcal Vaccine: Received Pneumococcal Vaccination: Yes Implantable Devices Family and Social History Cancer: No; Diabetes: Yes - Mother; Heart Disease: Yes - Father; Hereditary Spherocytosis: No; Hypertension: No; Kidney Disease: No; Lung Disease: No; Seizures: No; Stroke: Yes - Father; Thyroid Problems: No; Tuberculosis: No; Never smoker; Marital Status - Widowed; Alcohol Use: Never; Drug Use: No History; Caffeine Use: Moderate; Financial Concerns: No; Food, Clothing or Shelter Needs: No; Support System Lacking: No; Transportation Concerns: No; Advanced Directives: Yes ZACCHAEUS, HALM ([id number]) (Not Provided); Patient does not want information on Advanced Directives; Do not resuscitate: Yes (Not Provided); Living Will: Yes (Not Provided); Medical Power of Attorney: Yes (Not Provided) Physician Affirmation I have reviewed and agree with the above information. Electronic Signature(s) Signed: 12/20/2018 1:37:43 PM By: Harold Barban Signed: 12/22/2018 10:16:39 PM By: Worthy Keeler PA-C Entered By: Worthy Keeler on 12/19/2018 13:26:38 [place], [name] ([id number]) -------------------------------------------------------------------------------- SuperBill Details Patient Name: [name] Date of Service: 12/19/2018 Medical Record Number: [id number] Patient Account Number: [id number] Date of Birth/Sex: June 21, 1946 (72 y.o. Male) Treating RN: Harold Barban Primary Care Provider: Glendon Axe Other  Clinician: Referring Provider: Glendon Axe Treating Provider/Extender: Melburn Hake, HOYT Weeks in Treatment: 1 Diagnosis Coding ICD-10 Codes Code Description E11.621 Type 2 diabetes mellitus with foot ulcer L97.522 Non-pressure chronic ulcer of other part of left foot with fat layer exposed I10 Essential (primary) hypertension E11.43 Type 2 diabetes mellitus with diabetic autonomic (poly)neuropathy Facility Procedures CPT4 Code: [id number] Description: 36144 - DEB SUBQ TISSUE 20 SQ CM/< ICD-10 Diagnosis Description L97.522 Non-pressure chronic ulcer of other part of left foot with fat Modifier: layer exposed Quantity: 1 Physician Procedures CPT4 Code: [id number] Description: 11042 - WC PHYS SUBQ TISS 20 SQ CM ICD-10 Diagnosis Description L97.522 Non-pressure chronic ulcer of other part of left foot with fat Modifier: layer exposed Quantity: 1 Electronic Signature(s) Signed: 12/22/2018 10:16:39 PM By: Worthy Keeler PA-C Entered By: Worthy Keeler on 12/19/2018 13:27:33

## 2019-01-10 ENCOUNTER — Ambulatory Visit: Payer: Non-veteran care | Admitting: Physician Assistant

## 2019-01-19 ENCOUNTER — Encounter: Payer: Medicare Other | Attending: Physician Assistant | Admitting: Physician Assistant

## 2019-01-19 DIAGNOSIS — E11622 Type 2 diabetes mellitus with other skin ulcer: Secondary | ICD-10-CM | POA: Insufficient documentation

## 2019-01-19 DIAGNOSIS — Z823 Family history of stroke: Secondary | ICD-10-CM | POA: Insufficient documentation

## 2019-01-19 DIAGNOSIS — Z8673 Personal history of transient ischemic attack (TIA), and cerebral infarction without residual deficits: Secondary | ICD-10-CM | POA: Insufficient documentation

## 2019-01-19 DIAGNOSIS — Z8249 Family history of ischemic heart disease and other diseases of the circulatory system: Secondary | ICD-10-CM | POA: Insufficient documentation

## 2019-01-19 DIAGNOSIS — Z833 Family history of diabetes mellitus: Secondary | ICD-10-CM | POA: Diagnosis not present

## 2019-01-19 DIAGNOSIS — L97522 Non-pressure chronic ulcer of other part of left foot with fat layer exposed: Secondary | ICD-10-CM | POA: Insufficient documentation

## 2019-01-19 DIAGNOSIS — I10 Essential (primary) hypertension: Secondary | ICD-10-CM | POA: Insufficient documentation

## 2019-01-19 DIAGNOSIS — E1140 Type 2 diabetes mellitus with diabetic neuropathy, unspecified: Secondary | ICD-10-CM | POA: Diagnosis not present

## 2019-01-22 NOTE — Progress Notes (Signed)
[name], [name] ([id number]) Visit Report for 01/19/2019 Arrival Information Details Patient Name: [name], [name]. Date of Service: 01/19/2019 12:45 PM Medical Record Number: [id number] Patient Account Number: [id number] Date of Birth/Sex: 06/16/1946 (73 y.o. M) Treating RN: Montey Hora Primary Care [name] Other Clinician: Referring [name] Treating [name] Weeks in Treatment: 5 Visit Information History Since Last Visit Added or deleted any medications: No Patient Arrived: Cane Any new allergies or adverse reactions: No Arrival Time: 12:42 Had a fall or experienced change in No Accompanied By: self activities of daily living that may affect Transfer Assistance: None risk of falls: Patient Identification Verified: Yes Signs or symptoms of abuse/neglect since last visito No Secondary Verification Process Completed: Yes Hospitalized since last visit: No Patient Has Alerts: Yes Implantable device outside of the clinic excluding No Patient Alerts: DMII cellular tissue based products placed in the center since last visit: Has Dressing in Place as Prescribed: Yes Pain Present Now: Yes Electronic Signature(s) Signed: 01/19/2019 4:32:03 PM By: Lorine Bears RCP, RRT, CHT Entered By: Lorine Bears on 01/19/2019 12:43:17 [place], [name] ([id number]) -------------------------------------------------------------------------------- Encounter Discharge Information Details Patient Name: [name]. Date of Service: 01/19/2019 12:45 PM Medical Record Number: [id number] Patient Account Number: [id number] Date of Birth/Sex: 03-Jan-1946 (73 y.o. M) Treating RN: Montey Hora Primary Care Floy Riegler: Glendon [name] Other Clinician: Referring Ryatt Corsino: Glendon [name] Treating Fatma Rutten/Extender: STONE III, [name] Weeks in Treatment: 5 Encounter Discharge Information Items  Post Procedure Vitals Discharge Condition: Stable Temperature (F): 98.9 Ambulatory Status: Ambulatory Pulse (bpm): 80 Discharge Destination: Home Respiratory Rate (breaths/min): 16 Transportation: Private Auto Blood Pressure (mmHg): 148/84 Accompanied By: self Schedule Follow-up Appointment: Yes Clinical Summary of Care: Electronic Signature(s) Signed: 01/19/2019 5:09:04 PM By: Montey Hora Entered By: Montey Hora on 01/19/2019 13:37:34 [place], [name] ([id number]) -------------------------------------------------------------------------------- Lower Extremity Assessment Details Patient Name: [name]. Date of Service: 01/19/2019 12:45 PM Medical Record Number: [id number] Patient Account Number: [id number] Date of Birth/Sex: 1946/07/24 (73 y.o. M) Treating RN: Secundino Ginger Primary Care Shadeed Colberg: Glendon [name] Other Clinician: Referring Birtha Hatler: Glendon [name] Treating Concetta Guion/Extender: STONE III, [name] Weeks in Treatment: 5 Edema Assessment Assessed: [Left: No] [Right: No] [Left: Edema] [Right: :] Calf Left: Right: Point of Measurement: 33 cm From Medial Instep 38 cm cm Ankle Left: Right: Point of Measurement: 13 cm From Medial Instep 22 cm cm Vascular Assessment Claudication: Claudication Assessment [Left:None] Pulses: Dorsalis Pedis Palpable: [Left:Yes] Posterior Tibial Extremity colors, hair growth, and conditions: Extremity Color: [Left:Normal] Hair Growth on Extremity: [Left:No] Temperature of Extremity: [Left:Warm] Capillary Refill: [Left:< 3 seconds] Toe Nail Assessment Left: Right: Thick: No Discolored: No Deformed: No Improper Length and Hygiene: No Electronic Signature(s) Signed: 01/19/2019 4:33:03 PM By: Secundino Ginger Entered By: Secundino Ginger on 01/19/2019 13:03:44 [place], [name] ([id number]) -------------------------------------------------------------------------------- Multi Wound Chart Details Patient Name: [name]. Date of Service: 01/19/2019 12:45 PM Medical Record Number: [id number] Patient Account Number: [id number] Date of Birth/Sex: 13-Jul-1946 (73 y.o. M) Treating RN: Montey Hora Primary Care Madex Seals: Glendon [name] Other Clinician: Referring Dex Blakely: Glendon [name] Treating Quincey Quesinberry/Extender: STONE III, [name] Weeks in Treatment: 5 Vital Signs Height(in): 72 Pulse(bpm): 80 Weight(lbs): 250 Blood Pressure(mmHg): 148/84 Body Mass Index(BMI): 34 Temperature(F): 98.1 Respiratory Rate 16 (breaths/min): Photos: [N/A:N/A] Wound Location: Left Toe Great N/A N/A Wounding Event: Gradually Appeared N/A N/A Primary Etiology: Diabetic Wound/Ulcer of the N/A N/A Lower Extremity Comorbid History: Hypertension, Type II Diabetes N/A N/A Date Acquired: 11/30/2017 N/A N/A Weeks of Treatment: 5  N/A N/A Wound Status: Open N/A N/A Measurements L x W x D 0.4x0.4x0.3 N/A N/A (cm) Area (cm) : 0.126 N/A N/A Volume (cm) : 0.038 N/A N/A % Reduction in Area: 80.20% N/A N/A % Reduction in Volume: 40.60% N/A N/A Starting Position 1 11 (o'clock): Ending Position 1 1 (o'clock): Maximum Distance 1 (cm): 0.6 Undermining: Yes N/A N/A Classification: Grade 2 N/A N/A Exudate Amount: Small N/A N/A Exudate Type: Serous N/A N/A Exudate Color: amber N/A N/A Wound Margin: Thickened N/A N/A Granulation Amount: None Present (0%) N/A N/A Necrotic Amount: None Present (0%) N/A N/A Exposed Structures: Fat Layer (Subcutaneous N/A N/A Tissue) Exposed: Yes [name] ([id number]) Fascia: No Tendon: No Muscle: No Joint: No Bone: No Epithelialization: None N/A N/A Periwound Skin Texture: Callus: Yes N/A N/A Excoriation: No Induration: No Crepitus: No Rash: No Scarring: No Periwound Skin Moisture: Maceration: Yes N/A N/A Dry/Scaly: No Periwound Skin Color: Atrophie Blanche: No N/A N/A Cyanosis: No Ecchymosis: No Erythema: No Hemosiderin Staining: No Mottled: No Pallor:  No Rubor: No Temperature: No Abnormality N/A N/A Tenderness on Palpation: Yes N/A N/A Wound Preparation: Ulcer Cleansing: N/A N/A Rinsed/Irrigated with Saline Topical Anesthetic Applied: Other: lidocaine 4% Treatment Notes Electronic Signature(s) Signed: 01/19/2019 5:09:04 PM By: Montey Hora Entered By: Montey Hora on 01/19/2019 13:25:18 [place], [name] ([id number]) -------------------------------------------------------------------------------- Elizabeth Details Patient Name: [name] Date of Service: 01/19/2019 12:45 PM Medical Record Number: [id number] Patient Account Number: [id number] Date of Birth/Sex: 03-11-1946 (73 y.o. M) Treating RN: Montey Hora Primary Care Kiyra Slaubaugh: Glendon [name] Other Clinician: Referring Cully Luckow: Glendon [name] Treating Zinnia Tindall/Extender: STONE III, [name] Weeks in Treatment: 5 Active Inactive Abuse / Safety / Falls / Self Care Management Nursing Diagnoses: Potential for falls Goals: Patient will remain injury free related to falls Date Initiated: 01/19/2019 Target Resolution Date: 04/01/2019 Goal Status: Active Interventions: Assess fall risk on admission and as needed Notes: Orientation to the Wound Care Program Nursing Diagnoses: Knowledge deficit related to the wound healing center program Goals: Patient/caregiver will verbalize understanding of the [facility] Date Initiated: 01/19/2019 Target Resolution Date: 04/01/2019 Goal Status: Active Interventions: Provide education on orientation to the wound center Notes: Wound/Skin Impairment Nursing Diagnoses: Impaired tissue integrity Goals: Ulcer/skin breakdown will have a volume reduction of 30% by week 4 Date Initiated: 12/12/2018 Target Resolution Date: 01/12/2019 Goal Status: Active Interventions: Assess patient/caregiver ability to obtain necessary supplies HERSHEL, CORKERY ([id number]) Assess patient/caregiver  ability to perform ulcer/skin care regimen upon admission and as needed Notes: Electronic Signature(s) Signed: 01/19/2019 5:09:04 PM By: Montey Hora Entered By: Montey Hora on 01/19/2019 13:25:07 [name] ([id number]) -------------------------------------------------------------------------------- Pain Assessment Details Patient Name: [name]. Date of Service: 01/19/2019 12:45 PM Medical Record Number: [id number] Patient Account Number: [id number] Date of Birth/Sex: 11-19-1946 (73 y.o. M) Treating RN: Montey Hora Primary Care Ki Luckman: Glendon [name] Other Clinician: Referring Johanny Segers: Glendon [name] Treating Raschelle Wisenbaker/Extender: STONE III, [name] Weeks in Treatment: 5 Active Problems Location of Pain Severity and Description of Pain Patient Has Paino Yes Site Locations Rate the pain. Current Pain Level: 3 Pain Management and Medication Current Pain Management: Electronic Signature(s) Signed: 01/19/2019 4:32:03 PM By: Lorine Bears RCP, RRT, CHT Signed: 01/19/2019 5:09:04 PM By: Montey Hora Entered By: Lorine Bears on 01/19/2019 12:43:32 [place], [name] ([id number]) -------------------------------------------------------------------------------- Patient/Caregiver Education Details Patient Name: [name] Date of Service: 01/19/2019 12:45 PM Medical Record Number: [id number] Patient Account Number: [id number] Date of Birth/Gender: 1946-05-15 (73 y.o. M) Treating RN:  Montey Hora Primary Care Physician: Glendon [name] Other Clinician: Referring Physician: Glendon [name] Treating Physician/Extender: Sharalyn Ink in Treatment: 5 Education Assessment Education Provided To: Patient Education Topics Provided Offloading: Handouts: Other: TCC PRECAUTIONS Methods: Explain/Verbal Responses: State content correctly Electronic Signature(s) Signed: 01/19/2019 5:09:04 PM By: Montey Hora Entered By: Montey Hora on 01/19/2019 13:36:47 [place], [name] ([id number]) -------------------------------------------------------------------------------- Wound Assessment Details Patient Name: [name]. Date of Service: 01/19/2019 12:45 PM Medical Record Number: [id number] Patient Account Number: [id number] Date of Birth/Sex: [birth year] (73 y.o. M) Treating RN: Secundino Ginger Primary Care Anny Sayler: Glendon [name] Other Clinician: Referring Momo Braun: Glendon [name] Treating Breindel Collier/Extender: STONE III, [name] Weeks in Treatment: 5 Wound Status Wound Number: 1 Primary Etiology: Diabetic Wound/Ulcer of the Lower Extremity Wound Location: Left Toe Great Wound Status: Open Wounding Event: Gradually Appeared Comorbid Hypertension, Type II Diabetes Date Acquired: 11/30/2017 History: Weeks Of Treatment: 5 Clustered Wound: No Photos Photo Uploaded By: Secundino Ginger on 01/19/2019 13:06:52 Wound Measurements Length: (cm) 0.4 % Reduction Width: (cm) 0.4 % Reduction Depth: (cm) 0.3 Epitheliali Area: (cm) 0.126 Tunneling: Volume: (cm) 0.038 Underminin Starting Ending P Maximum in Area: 80.2% in Volume: 40.6% zation: None No g: Yes Position (o'clock): 11 osition (o'clock): 1 Distance: (cm) 0.6 Wound Description Classification: Grade 2 Foul Odor A Wound Margin: Thickened Slough/Fibr Exudate Amount: Small Exudate Type: Serous Exudate Color: amber fter Cleansing: No ino Yes Wound Bed Granulation Amount: None Present (0%) Exposed Structure Necrotic Amount: None Present (0%) Fascia Exposed: No Fat Layer (Subcutaneous Tissue) Exposed: Yes Tendon Exposed: No Muscle Exposed: No [place], [name] ([id number]) Joint Exposed: No Bone Exposed: No Periwound Skin Texture Texture Color No Abnormalities Noted: No No Abnormalities Noted: No Callus: Yes Atrophie Blanche: No Crepitus: No Cyanosis: No Excoriation: No Ecchymosis: No Induration:  No Erythema: No Rash: No Hemosiderin Staining: No Scarring: No Mottled: No Pallor: No Moisture Rubor: No No Abnormalities Noted: No Dry / Scaly: No Temperature / Pain Maceration: Yes Temperature: No Abnormality Tenderness on Palpation: Yes Wound Preparation Ulcer Cleansing: Rinsed/Irrigated with Saline Topical Anesthetic Applied: Other: lidocaine 4%, Treatment Notes Wound #1 (Left Toe Great) Notes silvercel, foam and TCC Electronic Signature(s) Signed: 01/19/2019 4:33:03 PM By: Secundino Ginger Entered By: Secundino Ginger on 01/19/2019 13:02:33 [place], [name] ([id number]) -------------------------------------------------------------------------------- Vitals Details Patient Name: [name]. Date of Service: 01/19/2019 12:45 PM Medical Record Number: [id number] Patient Account Number: [id number] Date of Birth/Sex: 01-10-1946 (73 y.o. M) Treating RN: Montey Hora Primary Care Ikechukwu Cerny: Glendon [name] Other Clinician: Referring Chelisa Hennen: Glendon [name] Treating Miyani Cronic/Extender: STONE III, [name] Weeks in Treatment: 5 Vital Signs Time Taken: 12:43 Temperature (F): 98.1 Height (in): 72 Pulse (bpm): 80 Weight (lbs): 250 Respiratory Rate (breaths/min): 16 Body Mass Index (BMI): 33.9 Blood Pressure (mmHg): 148/84 Reference Range: 80 - 120 mg / dl Airway Electronic Signature(s) Signed: 01/19/2019 4:32:03 PM By: Lorine Bears RCP, RRT, CHT Entered By: Lorine Bears on 01/19/2019 12:48:41

## 2019-01-23 ENCOUNTER — Encounter: Payer: Medicare Other | Admitting: Physician Assistant

## 2019-01-23 DIAGNOSIS — E11622 Type 2 diabetes mellitus with other skin ulcer: Secondary | ICD-10-CM | POA: Diagnosis not present

## 2019-01-24 NOTE — Progress Notes (Signed)
Fred, [name] ([id number]) Visit Report for 01/19/2019 Chief Complaint Document Details Patient Name: [name], [name]. Date of Service: 01/19/2019 12:45 PM Medical Record Number: [id number] Patient Account Number: [id number] Date of Birth/Sex: [birth year] (73 y.o. M) Treating RN: Montey Hora Primary Care Provider: Glendon Axe Other Clinician: Referring Provider: Glendon Axe Treating Provider/Extender: Melburn Hake, HOYT Weeks in Treatment: 5 Information Obtained from: Patient Chief Complaint Left great toe ulcer Electronic Signature(s) Signed: 01/21/2019 2:36:46 AM By: Worthy Keeler PA-C Entered By: Worthy Keeler on 01/19/2019 13:20:58 Shankles, [name] ([id number]) -------------------------------------------------------------------------------- Debridement Details Patient Name: [name]. Date of Service: 01/19/2019 12:45 PM Medical Record Number: [id number] Patient Account Number: [id number] Date of Birth/Sex: 08/29/1946 (73 y.o. M) Treating RN: Montey Hora Primary Care Provider: Glendon Axe Other Clinician: Referring Provider: Glendon Axe Treating Provider/Extender: STONE III, HOYT Weeks in Treatment: 5 Debridement Performed for Wound #1 Left Toe Great Assessment: Performed By: Physician STONE III, HOYT E., PA-C Debridement Type: Debridement Severity of Tissue Pre Fat layer exposed Debridement: Level of Consciousness (Pre- Awake and Alert procedure): Pre-procedure Verification/Time Yes - 13:29 Out Taken: Start Time: 13:29 Pain Control: Lidocaine 4% Topical Solution Total Area Debrided (L x W): 0.4 (cm) x 0.4 (cm) = 0.16 (cm) Tissue and other material Viable, Non-Viable, Callus, Slough, Subcutaneous, Slough debrided: Level: Skin/Subcutaneous Tissue Debridement Description: Excisional Instrument: Curette Bleeding: Moderate Hemostasis Achieved: Pressure End Time: 13:34 Procedural Pain: 0 Post Procedural Pain:  0 Response to Treatment: Procedure was tolerated well Level of Consciousness Awake and Alert (Post-procedure): Post Debridement Measurements of Total Wound Length: (cm) 0.6 Width: (cm) 0.6 Depth: (cm) 0.1 Volume: (cm) 0.028 Character of Wound/Ulcer Post Debridement: Improved Severity of Tissue Post Debridement: Fat layer exposed Post Procedure Diagnosis Same as Pre-procedure Electronic Signature(s) Signed: 01/19/2019 5:09:04 PM By: Montey Hora Signed: 01/21/2019 2:36:46 AM By: Worthy Keeler PA-C Entered By: Montey Hora on 01/19/2019 13:34:02 [place], [name] ([id number]) -------------------------------------------------------------------------------- HPI Details Patient Name: [name] Date of Service: 01/19/2019 12:45 PM Medical Record Number: [id number] Patient Account Number: [id number] Date of Birth/Sex: 1946/07/07 (73 y.o. M) Treating RN: Montey Hora Primary Care Provider: Glendon Axe Other Clinician: Referring Provider: Glendon Axe Treating Provider/Extender: Melburn Hake, HOYT Weeks in Treatment: 5 History of Present Illness HPI Description: 12/12/18 on evaluation today patient presents as a referral from his endocrinologist regarding issues that he has been having with his left great toe. Subsequently he has been seeing Dr. Cleda Mccreedy and Dr. Cleda Mccreedy has been the breeding the wound and in fact this was debrided this morning. The patient had an appointment there prior to coming here. Subsequently he states that even though he's been going for regular debridement after office things really do not seem to be showing signs of improvement unfortunately. He states that he is having some discomfort but nothing too significant. No fevers, chills, nausea, or vomiting noted at this time. The patient does have a history of hypertension, neuropathy, diabetes mellitus type II, and a history of stroke. Currently he's been using bacitracin on the toe and  utilizing a Band-Aid. He tells me he's had this wound for two years. He cannot remember the last time he had an x-ray. 12/19/18 on evaluation today patient appears to be doing well in regard to his plantar toe ulcer. He's been tolerating the dressing changes without complication. Fortunately there does not appear to be signs of infection at this time which is good news. No fever chills noted. 01/19/19 has been one month since I last  saw the patient as he was out of town. With that being said on evaluation today it does appear that he is going to require sharp debridement and I do believe that the total contact cast would benefit him as far as being applied at this time. He is in agreement that plan. Electronic Signature(s) Signed: 01/21/2019 2:36:46 AM By: Worthy Keeler PA-C Entered By: Worthy Keeler on 01/21/2019 02:15:21 [name] ([id number]) -------------------------------------------------------------------------------- Physical Exam Details Patient Name: Fred, [name] Date of Service: 01/19/2019 12:45 PM Medical Record Number: [id number] Patient Account Number: [id number] Date of Birth/Sex: [birth year] (73 y.o. M) Treating RN: Montey Hora Primary Care Provider: Glendon Axe Other Clinician: Referring Provider: Glendon Axe Treating Provider/Extender: STONE III, HOYT Weeks in Treatment: 5 Constitutional Well-nourished and well-hydrated in no acute distress. Respiratory normal breathing without difficulty. Psychiatric this patient is able to make decisions and demonstrates good insight into disease process. Alert and Oriented x 3. pleasant and cooperative. Notes On evaluation today patient's wound bed actually appears to be doing very well at this point. There is no evidence of infection which is great news. His toe did require sharp debridement and post debridement the wound bed was cleaned well and that a total contact cast was applied by myself as  well. Electronic Signature(s) Signed: 01/21/2019 2:36:46 AM By: Worthy Keeler PA-C Entered By: Worthy Keeler on 01/21/2019 02:11:03 [name] ([id number]) -------------------------------------------------------------------------------- Physician Orders Details Patient Name: [name] Date of Service: 01/19/2019 12:45 PM Medical Record Number: [id number] Patient Account Number: [id number] Date of Birth/Sex: Dec 13, 1946 (73 y.o. M) Treating RN: Montey Hora Primary Care Provider: Glendon Axe Other Clinician: Referring Provider: Glendon Axe Treating Provider/Extender: Melburn Hake, HOYT Weeks in Treatment: 5 Verbal / Phone Orders: No Diagnosis Coding ICD-10 Coding Code Description E11.621 Type 2 diabetes mellitus with foot ulcer L97.522 Non-pressure chronic ulcer of other part of left foot with fat layer exposed I10 Essential (primary) hypertension E11.43 Type 2 diabetes mellitus with diabetic autonomic (poly)neuropathy Wound Cleansing Wound #1 Left Toe Great o May shower with protection. - PLEASE DO NOT GET CAST WET Anesthetic (add to Medication List) Wound #1 Left Toe Great o Topical Lidocaine 4% cream applied to wound bed prior to debridement (In Clinic Only). Primary Wound Dressing Wound #1 Left Toe Great o Silver Alginate Secondary Dressing Wound #1 Left Toe Great o Foam Dressing Change Frequency Wound #1 Left Toe Great o Other: - MONDAY 01/23/2019 Follow-up Appointments Wound #1 Left Toe Great o Other: - MONDAY 01/23/2019 Off-Loading Wound #1 Left Toe Great o Total Contact Cast to Left Lower Extremity Electronic Signature(s) Signed: 01/19/2019 5:09:04 PM By: Montey Hora Signed: 01/21/2019 2:36:46 AM By: Worthy Keeler PA-C Entered By: Montey Hora on 01/19/2019 13:35:40 [place]uechle, [name] ([id number]) KANON, NOVOSEL  ([id number]) -------------------------------------------------------------------------------- Problem List Details Patient Name: ESTON, HESLIN. Date of Service: 01/19/2019 12:45 PM Medical Record Number: [id number] Patient Account Number: [id number] Date of Birth/Sex: 04/18/1946 (73 y.o. M) Treating RN: Montey Hora Primary Care Provider: Glendon Axe Other Clinician: Referring Provider: Glendon Axe Treating Provider/Extender: Melburn Hake, HOYT Weeks in Treatment: 5 Active Problems ICD-10 Evaluated Encounter Code Description Active Date Today Diagnosis E11.621 Type 2 diabetes mellitus with foot ulcer 12/12/2018 No Yes L97.522 Non-pressure chronic ulcer of other part of left foot with fat 12/12/2018 No Yes layer exposed I10 Essential (primary) hypertension 12/12/2018 No Yes E11.43 Type 2 diabetes mellitus with diabetic autonomic (poly) 12/12/2018 No Yes neuropathy Inactive Problems Resolved Problems Electronic Signature(s)  Signed: 01/21/2019 2:36:46 AM By: Worthy Keeler PA-C Entered By: Worthy Keeler on 01/19/2019 13:20:53 Durr, [name] ([id number]) -------------------------------------------------------------------------------- Progress Note Details Patient Name: [name] Date of Service: 01/19/2019 12:45 PM Medical Record Number: [id number] Patient Account Number: [id number] Date of Birth/Sex: 1946-09-11 (73 y.o. M) Treating RN: Montey Hora Primary Care Provider: Glendon Axe Other Clinician: Referring Provider: Glendon Axe Treating Provider/Extender: Melburn Hake, HOYT Weeks in Treatment: 5 Subjective Chief Complaint Information obtained from Patient Left great toe ulcer History of Present Illness (HPI) 12/12/18 on evaluation today patient presents as a referral from his endocrinologist regarding issues that he has been having with his left great toe. Subsequently he has been seeing Dr. Cleda Mccreedy and Dr. Cleda Mccreedy has been the breeding  the wound and in fact this was debrided this morning. The patient had an appointment there prior to coming here. Subsequently he states that even though he's been going for regular debridement after office things really do not seem to be showing signs of improvement unfortunately. He states that he is having some discomfort but nothing too significant. No fevers, chills, nausea, or vomiting noted at this time. The patient does have a history of hypertension, neuropathy, diabetes mellitus type II, and a history of stroke. Currently he's been using bacitracin on the toe and utilizing a Band-Aid. He tells me he's had this wound for two years. He cannot remember the last time he had an x-ray. 12/19/18 on evaluation today patient appears to be doing well in regard to his plantar toe ulcer. He's been tolerating the dressing changes without complication. Fortunately there does not appear to be signs of infection at this time which is good news. No fever chills noted. 01/19/19 has been one month since I last saw the patient as he was out of town. With that being said on evaluation today it does appear that he is going to require sharp debridement and I do believe that the total contact cast would benefit him as far as being applied at this time. He is in agreement that plan. Patient History Information obtained from Patient. Family History Diabetes - Mother, Heart Disease - Father, Stroke - Father, No family history of Cancer, Hereditary Spherocytosis, Hypertension, Kidney Disease, Lung Disease, Seizures, Thyroid Problems, Tuberculosis. Social History Never smoker, Marital Status - Widowed, Alcohol Use - Never, Drug Use - No History, Caffeine Use - Moderate. Review of Systems (ROS) Constitutional Symptoms (General Health) Denies complaints or symptoms of Fatigue, Fever, Chills. Respiratory The patient has no complaints or symptoms. Cardiovascular The patient has no complaints or  symptoms. Psychiatric The patient has no complaints or symptoms. ROZELL, THEILER ([id number]) Objective Constitutional Well-nourished and well-hydrated in no acute distress. Vitals Time Taken: 12:43 PM, Height: 72 in, Weight: 250 lbs, BMI: 33.9, Temperature: 98.1 F, Pulse: 80 bpm, Respiratory Rate: 16 breaths/min, Blood Pressure: 148/84 mmHg. Respiratory normal breathing without difficulty. Psychiatric this patient is able to make decisions and demonstrates good insight into disease process. Alert and Oriented x 3. pleasant and cooperative. General Notes: On evaluation today patient's wound bed actually appears to be doing very well at this point. There is no evidence of infection which is great news. His toe did require sharp debridement and post debridement the wound bed was cleaned well and that a total contact cast was applied by myself as well. Integumentary (Hair, Skin) Wound #1 status is Open. Original cause of wound was Gradually Appeared. The wound is located on the Left Toe Great. The wound  measures 0.4cm length x 0.4cm width x 0.3cm depth; 0.126cm^2 area and 0.038cm^3 volume. There is Fat Layer (Subcutaneous Tissue) Exposed exposed. There is no tunneling noted, however, there is undermining starting at 11:00 and ending at 1:00 with a maximum distance of 0.6cm. There is a small amount of serous drainage noted. The wound margin is thickened. There is no granulation within the wound bed. There is no necrotic tissue within the wound bed. The periwound skin appearance exhibited: Callus, Maceration. The periwound skin appearance did not exhibit: Crepitus, Excoriation, Induration, Rash, Scarring, Dry/Scaly, Atrophie Blanche, Cyanosis, Ecchymosis, Hemosiderin Staining, Mottled, Pallor, Rubor, Erythema. Periwound temperature was noted as No Abnormality. The periwound has tenderness on palpation. Assessment Active Problems ICD-10 Type 2 diabetes mellitus with foot  ulcer Non-pressure chronic ulcer of other part of left foot with fat layer exposed Essential (primary) hypertension Type 2 diabetes mellitus with diabetic autonomic (poly)neuropathy Procedures Wound #1 WEST, BOOMERSHINE ([id number]) Pre-procedure diagnosis of Wound #1 is a Diabetic Wound/Ulcer of the Lower Extremity located on the Left Toe Great .Severity of Tissue Pre Debridement is: Fat layer exposed. There was a Excisional Skin/Subcutaneous Tissue Debridement with a total area of 0.16 sq cm performed by STONE III, HOYT E., PA-C. With the following instrument(s): Curette to remove Viable and Non-Viable tissue/material. Material removed includes Callus, Subcutaneous Tissue, and Slough after achieving pain control using Lidocaine 4% Topical Solution. No specimens were taken. A time out was conducted at 13:29, prior to the start of the procedure. A Moderate amount of bleeding was controlled with Pressure. The procedure was tolerated well with a pain level of 0 throughout and a pain level of 0 following the procedure. Post Debridement Measurements: 0.6cm length x 0.6cm width x 0.1cm depth; 0.028cm^3 volume. Character of Wound/Ulcer Post Debridement is improved. Severity of Tissue Post Debridement is: Fat layer exposed. Post procedure Diagnosis Wound #1: Same as Pre-Procedure Pre-procedure diagnosis of Wound #1 is a Diabetic Wound/Ulcer of the Lower Extremity located on the Left Toe Great . There was a Total Contact Cast Procedure by STONE III, HOYT E., PA-C. Post procedure Diagnosis Wound #1: Same as Pre-Procedure Plan Wound Cleansing: Wound #1 Left Toe Great: May shower with protection. - PLEASE DO NOT GET CAST WET Anesthetic (add to Medication List): Wound #1 Left Toe Great: Topical Lidocaine 4% cream applied to wound bed prior to debridement (In Clinic Only). Primary Wound Dressing: Wound #1 Left Toe Great: Silver Alginate Secondary Dressing: Wound #1 Left Toe  Great: Foam Dressing Change Frequency: Wound #1 Left Toe Great: Other: - MONDAY 01/23/2019 Follow-up Appointments: Wound #1 Left Toe Great: Other: - MONDAY 01/23/2019 Off-Loading: Wound #1 Left Toe Great: Total Contact Cast to Left Lower Extremity At this point my suggestion is gonna be that we go ahead and initiate the above wound care measures for the total contact cast of the next week he is in agreement with this plan. We will subsequently see were things stand at follow-up. If anything changes he will let me know. Please see above for specific wound care orders. We will see patient for re-evaluation in 1 week(s) here in the clinic. If anything worsens or changes patient will contact our office for additional recommendations. Electronic Signature(s) Signed: 01/21/2019 2:36:46 AM By: Irean Hong Salak, Byers ([id number]) Entered By: Worthy Keeler on 01/21/2019 02:16:22 [name] ([id number]) -------------------------------------------------------------------------------- ROS/PFSH Details Patient Name: SAMAAD, HASHEM Date of Service: 01/19/2019 12:45 PM Medical Record Number: [id number] Patient Account Number: [id number] Date of  Birth/Sex: 25-Sep-1946 (73 y.o. M) Treating RN: Montey Hora Primary Care Provider: Glendon Axe Other Clinician: Referring Provider: Glendon Axe Treating Provider/Extender: STONE III, HOYT Weeks in Treatment: 5 Information Obtained From Patient Wound History Do you currently have one or more open woundso No Have you tested positive for osteomyelitis (bone infection)o No Have you had any tests for circulation on your legso No Constitutional Symptoms (General Health) Complaints and Symptoms: Negative for: Fatigue; Fever; Chills Eyes Medical History: Negative for: Cataracts; Glaucoma; Optic Neuritis Ear/Nose/Mouth/Throat Medical History: Negative for: Chronic sinus problems/congestion; Middle ear  problems Hematologic/Lymphatic Medical History: Negative for: Anemia; Hemophilia; Human Immunodeficiency Virus; Lymphedema Respiratory Complaints and Symptoms: No Complaints or Symptoms Medical History: Negative for: Aspiration; Asthma; Chronic Obstructive Pulmonary Disease (COPD); Pneumothorax; Sleep Apnea; Tuberculosis Cardiovascular Complaints and Symptoms: No Complaints or Symptoms Medical History: Positive for: Hypertension Negative for: Angina; Arrhythmia; Congestive Heart Failure; Coronary Artery Disease; Deep Vein Thrombosis; Hypotension; Myocardial Infarction; Peripheral Arterial Disease; Peripheral Venous Disease; Phlebitis; Vasculitis Gastrointestinal EMMITTE, SURGEON ([id number]) Medical History: Negative for: Cirrhosis ; Colitis; Crohnos; Hepatitis A; Hepatitis B; Hepatitis C Endocrine Medical History: Positive for: Type II Diabetes Negative for: Type I Diabetes Time with diabetes: 1990 Treated with: Insulin Blood sugar tested every day: Yes Tested : Genitourinary Medical History: Negative for: End Stage Renal Disease Immunological Medical History: Negative for: Lupus Erythematosus; Raynaudos; Scleroderma Integumentary (Skin) Medical History: Negative for: History of Burn; History of pressure wounds Musculoskeletal Medical History: Negative for: Gout; Rheumatoid Arthritis; Osteoarthritis; Osteomyelitis Neurologic Medical History: Negative for: Dementia; Neuropathy; Quadriplegia; Paraplegia; Seizure Disorder Oncologic Medical History: Negative for: Received Chemotherapy; Received Radiation Psychiatric Complaints and Symptoms: No Complaints or Symptoms Immunizations Pneumococcal Vaccine: Received Pneumococcal Vaccination: Yes Implantable Devices Family and Social History Cancer: No; Diabetes: Yes - Mother; Heart Disease: Yes - Father; Hereditary Spherocytosis: No; Hypertension: No; Kidney Disease: No; Lung Disease: No; Seizures: No; Stroke: Yes  - Father; Thyroid Problems: No; Tuberculosis: No; Never smoker; Marital Status - Widowed; Alcohol Use: Never; Drug Use: No History; Caffeine Use: Moderate; Financial Concerns: No; Food, Clothing or Shelter Needs: No; Support System Lacking: No; Transportation Concerns: No; Advanced Directives: Yes CIARAN, BEGAY ([id number]) (Not Provided); Patient does not want information on Advanced Directives; Do not resuscitate: Yes (Not Provided); Living Will: Yes (Not Provided); Medical Power of Attorney: Yes (Not Provided) Physician Affirmation I have reviewed and agree with the above information. Electronic Signature(s) Signed: 01/21/2019 2:36:46 AM By: Worthy Keeler PA-C Signed: 01/23/2019 4:38:40 PM By: Montey Hora Entered By: Worthy Keeler on 01/21/2019 02:09:36 [place], [name] ([id number]) -------------------------------------------------------------------------------- Total Contact Cast Details Patient Name: LIO, WEHRLY. Date of Service: 01/19/2019 12:45 PM Medical Record Number: [id number] Patient Account Number: [id number] Date of Birth/Sex: 28-May-1946 (73 y.o. M) Treating RN: Montey Hora Primary Care Provider: Glendon Axe Other Clinician: Referring Provider: Glendon Axe Treating Provider/Extender: STONE III, HOYT Weeks in Treatment: 5 Total Contact Cast Applied for Wound Assessment: Wound #1 Left Toe Great Performed By: Physician Emilio Math., PA-C Post Procedure Diagnosis Same as Pre-procedure Electronic Signature(s) Signed: 01/19/2019 5:09:04 PM By: Montey Hora Signed: 01/21/2019 2:36:46 AM By: Worthy Keeler PA-C Entered By: Montey Hora on 01/19/2019 13:36:20 [place], [name] ([id number]) -------------------------------------------------------------------------------- SuperBill Details Patient Name: [name]. Date of Service: 01/19/2019 Medical Record Number: [id number] Patient Account Number: [id number] Date  of Birth/Sex: 1946/12/24 (73 y.o. M) Treating RN: Montey Hora Primary Care Provider: Glendon Axe Other Clinician: Referring Provider: Glendon Axe Treating Provider/Extender: STONE III, HOYT Weeks in  Treatment: 5 Diagnosis Coding ICD-10 Codes Code Description E11.621 Type 2 diabetes mellitus with foot ulcer L97.522 Non-pressure chronic ulcer of other part of left foot with fat layer exposed I10 Essential (primary) hypertension E11.43 Type 2 diabetes mellitus with diabetic autonomic (poly)neuropathy Facility Procedures CPT4 Code: [id number] Description: 75436 - DEB SUBQ TISSUE 20 SQ CM/< ICD-10 Diagnosis Description L97.522 Non-pressure chronic ulcer of other part of left foot with fat Modifier: layer exposed Quantity: 1 Physician Procedures CPT4 Code: [id number] Description: 11042 - WC PHYS SUBQ TISS 20 SQ CM ICD-10 Diagnosis Description L97.522 Non-pressure chronic ulcer of other part of left foot with fat Modifier: layer exposed Quantity: 1 Electronic Signature(s) Signed: 01/21/2019 2:36:46 AM By: Worthy Keeler PA-C Entered By: Worthy Keeler on 01/21/2019 02:11:55

## 2019-01-26 ENCOUNTER — Ambulatory Visit: Payer: Non-veteran care | Admitting: Physician Assistant

## 2019-01-29 NOTE — Progress Notes (Signed)
DYLIN, BREEDEN ([id number]) Visit Report for 01/23/2019 Arrival Information Details Patient Name: [name], [name]. Date of Service: 01/23/2019 3:30 PM Medical Record Number: [id number] Patient Account Number: [id number] Date of Birth/Sex: 01/27/1946 (73 y.o. M) Treating RN: Cornell Barman Primary Care Sirena Riddle: Glendon Axe Other Clinician: Referring Dorwin Fitzhenry: Glendon Axe Treating Marnae Madani/Extender: Melburn Hake, HOYT Weeks in Treatment: 6 Visit Information History Since Last Visit Added or deleted any medications: No Patient Arrived: Cane Any new allergies or adverse reactions: No Arrival Time: 15:28 Had a fall or experienced change in No Accompanied By: self activities of daily living that may affect Transfer Assistance: None risk of falls: Patient Identification Verified: Yes Signs or symptoms of abuse/neglect since last visito No Secondary Verification Process Completed: Yes Hospitalized since last visit: No Patient Has Alerts: Yes Implantable device outside of the clinic excluding No Patient Alerts: DMII cellular tissue based products placed in the center since last visit: Has Dressing in Place as Prescribed: Yes Pain Present Now: No Electronic Signature(s) Signed: 01/23/2019 4:21:09 PM By: Lorine Bears RCP, RRT, CHT Entered By: Lorine Bears on 01/23/2019 15:28:58 [name] ([id number]) -------------------------------------------------------------------------------- Encounter Discharge Information Details Patient Name: [name]. Date of Service: 01/23/2019 3:30 PM Medical Record Number: [id number] Patient Account Number: [id number] Date of Birth/Sex: 1946-01-07 (73 y.o. M) Treating RN: Cornell Barman Primary Care Mattheu Brodersen: Glendon Axe Other Clinician: Referring Jammie Clink: Glendon Axe Treating Sofiah Lyne/Extender: Melburn Hake, HOYT Weeks in Treatment: 6 Encounter Discharge Information Items Post Procedure  Vitals Discharge Condition: Stable Temperature (F): 98.6 Ambulatory Status: Ambulatory Pulse (bpm): 90 Discharge Destination: Home Respiratory Rate (breaths/min): 16 Transportation: Private Auto Blood Pressure (mmHg): 142/82 Accompanied By: self Schedule Follow-up Appointment: Yes Clinical Summary of Care: Electronic Signature(s) Signed: 01/23/2019 4:45:46 PM By: Gretta Cool, BSN, RN, CWS, Kim RN, BSN Entered By: Gretta Cool, BSN, RN, CWS, Kim on 01/23/2019 16:11:37 [name], [name] ([id number]) -------------------------------------------------------------------------------- Lower Extremity Assessment Details Patient Name: [name]. Date of Service: 01/23/2019 3:30 PM Medical Record Number: [id number] Patient Account Number: [id number] Date of Birth/Sex: 1946/10/04 (73 y.o. M) Treating RN: Montey Hora Primary Care Lavante Toso: Glendon Axe Other Clinician: Referring See Beharry: Glendon Axe Treating Rolanda Campa/Extender: STONE III, HOYT Weeks in Treatment: 6 Edema Assessment Assessed: [Left: No] [Right: No] [Left: Edema] [Right: :] Calf Left: Right: Point of Measurement: 33 cm From Medial Instep 37 cm cm Ankle Left: Right: Point of Measurement: 13 cm From Medial Instep 22 cm cm Vascular Assessment Pulses: Dorsalis Pedis Palpable: [Left:Yes] Posterior Tibial Extremity colors, hair growth, and conditions: Extremity Color: [Left:Normal] Hair Growth on Extremity: [Left:No] Temperature of Extremity: [Left:Warm] Capillary Refill: [Left:< 3 seconds] Toe Nail Assessment Left: Right: Thick: No Discolored: Yes Deformed: No Improper Length and Hygiene: Yes Electronic Signature(s) Signed: 01/23/2019 4:38:40 PM By: Montey Hora Entered By: Montey Hora on 01/23/2019 15:52:10 [name], [name] ([id number]) -------------------------------------------------------------------------------- Multi Wound Chart Details Patient Name: [name]. Date of  Service: 01/23/2019 3:30 PM Medical Record Number: [id number] Patient Account Number: [id number] Date of Birth/Sex: June 23, 1946 (73 y.o. M) Treating RN: Cornell Barman Primary Care Salvatore Poe: Glendon Axe Other Clinician: Referring Naquita Nappier: Glendon Axe Treating Houston Zapien/Extender: STONE III, HOYT Weeks in Treatment: 6 Vital Signs Height(in): 72 Pulse(bpm): 90 Weight(lbs): 250 Blood Pressure(mmHg): 142/82 Body Mass Index(BMI): 34 Temperature(F): 98.6 Respiratory Rate 16 (breaths/min): Photos: [1:No Photos] [N/A:N/A] Wound Location: [1:Left Toe Great] [N/A:N/A] Wounding Event: [1:Gradually Appeared] [N/A:N/A] Primary Etiology: [1:Diabetic Wound/Ulcer of the N/A Lower Extremity] Comorbid History: [1:Hypertension, Type II Diabetes N/A] Date Acquired: [1:11/30/2017] [N/A:N/A] Weeks of Treatment: [  1:6] [N/A:N/A] Wound Status: [1:Open] [N/A:N/A] Measurements L x W x D [1:0.4x0.4x0.1] [N/A:N/A] (cm) Area (cm) : [1:0.126] [N/A:N/A] Volume (cm) : [1:0.013] [N/A:N/A] % Reduction in Area: [1:80.20%] [N/A:N/A] % Reduction in Volume: [1:79.70%] [N/A:N/A] Classification: [1:Grade 2] [N/A:N/A] Exudate Amount: [1:Medium] [N/A:N/A] Exudate Type: [1:Serous] [N/A:N/A] Exudate Color: [1:amber] [N/A:N/A] Wound Margin: [1:Thickened] [N/A:N/A] Granulation Amount: [1:Large (67-100%)] [N/A:N/A] Granulation Quality: [1:Pink] [N/A:N/A] Necrotic Amount: [1:None Present (0%)] [N/A:N/A] Exposed Structures: [1:Fat Layer (Subcutaneous Tissue) Exposed: Yes Fascia: No Tendon: No Muscle: No Joint: No Bone: No] [N/A:N/A] Epithelialization: [1:Small (1-33%)] [N/A:N/A] Periwound Skin Texture: [1:Callus: Yes Excoriation: No Induration: No Crepitus: No Rash: No Scarring: No] [N/A:N/A] Periwound Skin Moisture: Maceration: Yes N/A N/A Dry/Scaly: No Periwound Skin Color: Atrophie Blanche: No N/A N/A Cyanosis: No Ecchymosis: No Erythema: No Hemosiderin Staining: No Mottled: No Pallor: No Rubor:  No Temperature: No Abnormality N/A N/A Tenderness on Palpation: Yes N/A N/A Wound Preparation: Ulcer Cleansing: N/A N/A Rinsed/Irrigated with Saline, Other: soap and water Topical Anesthetic Applied: Other: lidocaine 4% Treatment Notes Electronic Signature(s) Signed: 01/23/2019 4:45:46 PM By: Gretta Cool, BSN, RN, CWS, Kim RN, BSN Entered By: Gretta Cool, BSN, RN, CWS, Kim on 01/23/2019 16:05:35 [place], [name] ([id number]) -------------------------------------------------------------------------------- [place] Details Patient Name: [name], [name]. Date of Service: 01/23/2019 3:30 PM Medical Record Number: [id number] Patient Account Number: [id number] Date of Birth/Sex: 08/11/1946 (73 y.o. M) Treating RN: Cornell Barman Primary Care Ren Aspinall: Glendon Axe Other Clinician: Referring Isom Kochan: Glendon Axe Treating Christol Thetford/Extender: Melburn Hake, HOYT Weeks in Treatment: 6 Active Inactive Abuse / Safety / Falls / Self Care Management Nursing Diagnoses: Potential for falls Goals: Patient will remain injury free related to falls Date Initiated: 01/19/2019 Target Resolution Date: 04/01/2019 Goal Status: Active Interventions: Assess fall risk on admission and as needed Notes: Orientation to the Wound Care Program Nursing Diagnoses: Knowledge deficit related to the wound healing center program Goals: Patient/caregiver will verbalize understanding of the [facility] Date Initiated: 01/19/2019 Target Resolution Date: 04/01/2019 Goal Status: Active Interventions: Provide education on orientation to the wound center Notes: Wound/Skin Impairment Nursing Diagnoses: Impaired tissue integrity Goals: Ulcer/skin breakdown will have a volume reduction of 30% by week 4 Date Initiated: 12/12/2018 Target Resolution Date: 01/12/2019 Goal Status: Active Interventions: Assess patient/caregiver ability to obtain necessary supplies HIPOLITO, MARTINEZLOPEZ  ([id number]) Assess patient/caregiver ability to perform ulcer/skin care regimen upon admission and as needed Notes: Electronic Signature(s) Signed: 01/23/2019 4:45:46 PM By: Gretta Cool, BSN, RN, CWS, Kim RN, BSN Entered By: Gretta Cool, BSN, RN, CWS, Kim on 01/23/2019 16:05:27 [place], [name] ([id number]) -------------------------------------------------------------------------------- Pain Assessment Details Patient Name: [name]. Date of Service: 01/23/2019 3:30 PM Medical Record Number: [id number] Patient Account Number: [id number] Date of Birth/Sex: August 27, 1946 (73 y.o. M) Treating RN: Cornell Barman Primary Care Lynsie Mcwatters: Glendon Axe Other Clinician: Referring Tieasha Larsen: Glendon Axe Treating Meiah Zamudio/Extender: STONE III, HOYT Weeks in Treatment: 6 Active Problems Location of Pain Severity and Description of Pain Patient Has Paino No Site Locations Pain Management and Medication Current Pain Management: Electronic Signature(s) Signed: 01/23/2019 4:21:09 PM By: Lorine Bears RCP, RRT, CHT Signed: 01/23/2019 4:45:46 PM By: Gretta Cool, BSN, RN, CWS, Kim RN, BSN Entered By: Lorine Bears on 01/23/2019 15:29:07 [name] ([id number]) -------------------------------------------------------------------------------- Patient/Caregiver Education Details Patient Name: [name] Date of Service: 01/23/2019 3:30 PM Medical Record Number: [id number] Patient Account Number: [id number] Date of Birth/Gender: 04-19-1946 (73 y.o. M) Treating RN: Cornell Barman Primary Care Physician: Glendon Axe Other Clinician: Referring Physician: Glendon Axe Treating Physician/Extender: Melburn Hake,  HOYT Weeks in Treatment: 6 Education Assessment Education Provided To: Patient Education Topics Provided Offloading: Handouts: What is Offloadingo Methods: Demonstration, Explain/Verbal Responses: State content correctly Wound/Skin  Impairment: Handouts: Caring for Your Ulcer Methods: Demonstration, Explain/Verbal Responses: State content correctly Electronic Signature(s) Signed: 01/23/2019 4:45:46 PM By: Gretta Cool, BSN, RN, CWS, Kim RN, BSN Entered By: Gretta Cool, BSN, RN, CWS, Kim on 01/23/2019 16:09:42 [name] ([id number]) -------------------------------------------------------------------------------- Wound Assessment Details Patient Name: [name]. Date of Service: 01/23/2019 3:30 PM Medical Record Number: [id number] Patient Account Number: [id number] Date of Birth/Sex: 11/03/1946 (73 y.o. M) Treating RN: Montey Hora Primary Care Isais Klipfel: Glendon Axe Other Clinician: Referring Kataleah Bejar: Glendon Axe Treating Teagon Kron/Extender: STONE III, HOYT Weeks in Treatment: 6 Wound Status Wound Number: 1 Primary Etiology: Diabetic Wound/Ulcer of the Lower Extremity Wound Location: Left Toe Great Wound Status: Open Wounding Event: Gradually Appeared Comorbid Hypertension, Type II Diabetes Date Acquired: 11/30/2017 History: Weeks Of Treatment: 6 Clustered Wound: No Wound Measurements Length: (cm) 0.4 Width: (cm) 0.4 Depth: (cm) 0.1 Area: (cm) 0.126 Volume: (cm) 0.013 % Reduction in Area: 80.2% % Reduction in Volume: 79.7% Epithelialization: Small (1-33%) Tunneling: No Undermining: No Wound Description Classification: Grade 2 Foul Od Wound Margin: Thickened Slough/ Exudate Amount: Medium Exudate Type: Serous Exudate Color: amber or After Cleansing: No Fibrino Yes Wound Bed Granulation Amount: Large (67-100%) Exposed Structure Granulation Quality: Pink Fascia Exposed: No Necrotic Amount: None Present (0%) Fat Layer (Subcutaneous Tissue) Exposed: Yes Tendon Exposed: No Muscle Exposed: No Joint Exposed: No Bone Exposed: No Periwound Skin Texture Texture Color No Abnormalities Noted: No No Abnormalities Noted: No Callus: Yes Atrophie Blanche: No Crepitus:  No Cyanosis: No Excoriation: No Ecchymosis: No Induration: No Erythema: No Rash: No Hemosiderin Staining: No Scarring: No Mottled: No Pallor: No Moisture Rubor: No No Abnormalities Noted: No Dry / Scaly: No Temperature / Pain Maceration: Yes Temperature: No Abnormality Tenderness on Palpation: Yes [name], [name] ([id number]) Wound Preparation Ulcer Cleansing: Rinsed/Irrigated with Saline, Other: soap and water, Topical Anesthetic Applied: Other: lidocaine 4%, Treatment Notes Wound #1 (Left Toe Great) Notes silvercel, foam and TCC Electronic Signature(s) Signed: 01/23/2019 4:38:40 PM By: Montey Hora Entered By: Montey Hora on 01/23/2019 15:50:31 [name], [name] ([id number]) -------------------------------------------------------------------------------- Vitals Details Patient Name: [name]. Date of Service: 01/23/2019 3:30 PM Medical Record Number: [id number] Patient Account Number: [id number] Date of Birth/Sex: 13-Dec-1946 (73 y.o. M) Treating RN: Cornell Barman Primary Care Trevion Hoben: Glendon Axe Other Clinician: Referring Bodhi Stenglein: Glendon Axe Treating Mike Hamre/Extender: STONE III, HOYT Weeks in Treatment: 6 Vital Signs Time Taken: 15:29 Temperature (F): 98.6 Height (in): 72 Pulse (bpm): 90 Weight (lbs): 250 Respiratory Rate (breaths/min): 16 Body Mass Index (BMI): 33.9 Blood Pressure (mmHg): 142/82 Reference Range: 80 - 120 mg / dl Airway Electronic Signature(s) Signed: 01/23/2019 4:21:09 PM By: Lorine Bears RCP, RRT, CHT Entered By: Lorine Bears on 01/23/2019 15:33:06

## 2019-01-30 ENCOUNTER — Encounter: Payer: Non-veteran care | Admitting: Physician Assistant

## 2019-01-31 ENCOUNTER — Encounter: Payer: Medicare Other | Attending: Physician Assistant | Admitting: Physician Assistant

## 2019-01-31 DIAGNOSIS — I10 Essential (primary) hypertension: Secondary | ICD-10-CM | POA: Diagnosis not present

## 2019-01-31 DIAGNOSIS — L97522 Non-pressure chronic ulcer of other part of left foot with fat layer exposed: Secondary | ICD-10-CM | POA: Diagnosis not present

## 2019-01-31 DIAGNOSIS — E11621 Type 2 diabetes mellitus with foot ulcer: Secondary | ICD-10-CM | POA: Insufficient documentation

## 2019-01-31 DIAGNOSIS — E1121 Type 2 diabetes mellitus with diabetic nephropathy: Secondary | ICD-10-CM | POA: Insufficient documentation

## 2019-02-01 NOTE — Progress Notes (Signed)
Fred, [name] ([id number]) Visit Report for 01/31/2019 Chief Complaint Document Details Patient Name: [name], [name]. Date of Service: 01/31/2019 12:45 PM Medical Record Number: [id number] Patient Account Number: [id number] Date of Birth/Sex: 09-19-1946 (73 y.o. M) Treating RN: Montey Hora Primary Care Provider: Glendon Axe Other Clinician: Referring Provider: Glendon Axe Treating Provider/Extender: STONE III, HOYT Weeks in Treatment: 7 Information Obtained from: Patient Chief Complaint Left great toe ulcer Electronic Signature(s) Signed: 01/31/2019 5:55:30 PM By: Worthy Keeler PA-C Entered By: Worthy Keeler on 01/31/2019 13:10:07 [place], [name] ([id number]) -------------------------------------------------------------------------------- Debridement Details Patient Name: [name]. Date of Service: 01/31/2019 12:45 PM Medical Record Number: [id number] Patient Account Number: [id number] Date of Birth/Sex: 01/31/1946 (73 y.o. M) Treating RN: Montey Hora Primary Care Provider: Glendon Axe Other Clinician: Referring Provider: Glendon Axe Treating Provider/Extender: STONE III, HOYT Weeks in Treatment: 7 Debridement Performed for Wound #1 Left Toe Great Assessment: Performed By: Physician STONE III, HOYT E., PA-C Debridement Type: Debridement Severity of Tissue Pre Fat layer exposed Debridement: Level of Consciousness (Pre- Awake and Alert procedure): Pre-procedure Verification/Time Yes - 13:14 Out Taken: Start Time: 13:14 Pain Control: Lidocaine 4% Topical Solution Total Area Debrided (L x W): 0.2 (cm) x 0.2 (cm) = 0.04 (cm) Tissue and other material Viable, Non-Viable, Callus, Slough, Subcutaneous, Slough debrided: Level: Skin/Subcutaneous Tissue Debridement Description: Excisional Instrument: Curette Bleeding: Minimum Hemostasis Achieved: Pressure End Time: 13:16 Procedural Pain: 0 Post Procedural Pain:  0 Response to Treatment: Procedure was tolerated well Level of Consciousness Awake and Alert (Post-procedure): Post Debridement Measurements of Total Wound Length: (cm) 0.2 Width: (cm) 0.2 Depth: (cm) 0.2 Volume: (cm) 0.006 Character of Wound/Ulcer Post Debridement: Improved Severity of Tissue Post Debridement: Fat layer exposed Post Procedure Diagnosis Same as Pre-procedure Electronic Signature(s) Signed: 01/31/2019 4:52:45 PM By: Montey Hora Signed: 01/31/2019 5:55:30 PM By: Worthy Keeler PA-C Entered By: Montey Hora on 01/31/2019 13:17:02 [place], [name] ([id number]) -------------------------------------------------------------------------------- HPI Details Patient Name: [name] Date of Service: 01/31/2019 12:45 PM Medical Record Number: [id number] Patient Account Number: [id number] Date of Birth/Sex: 09-12-1946 (73 y.o. M) Treating RN: Montey Hora Primary Care Provider: Glendon Axe Other Clinician: Referring Provider: Glendon Axe Treating Provider/Extender: Melburn Hake, HOYT Weeks in Treatment: 7 History of Present Illness HPI Description: 12/12/18 on evaluation today patient presents as a referral from his endocrinologist regarding issues that he has been having with his left great toe. Subsequently he has been seeing Dr. Cleda Mccreedy and Dr. Cleda Mccreedy has been the breeding the wound and in fact this was debrided this morning. The patient had an appointment there prior to coming here. Subsequently he states that even though he's been going for regular debridement after office things really do not seem to be showing signs of improvement unfortunately. He states that he is having some discomfort but nothing too significant. No fevers, chills, nausea, or vomiting noted at this time. The patient does have a history of hypertension, neuropathy, diabetes mellitus type II, and a history of stroke. Currently he's been using bacitracin on the toe and utilizing a  Band-Aid. He tells me he's had this wound for two years. He cannot remember the last time he had an x-ray. 12/19/18 on evaluation today patient appears to be doing well in regard to his plantar toe ulcer. He's been tolerating the dressing changes without complication. Fortunately there does not appear to be signs of infection at this time which is good news. No fever chills noted. 01/19/19 has been one month since I last  saw the patient as he was out of town. With that being said on evaluation today it does appear that he is going to require sharp debridement and I do believe that the total contact cast would benefit him as far as being applied at this time. He is in agreement that plan. 01/23/19 on evaluation today patient appears to be doing better in regard to his left great toe ulcer. He has been shown signs of improvement week by week and although this is slow it does seem to be doing fairly well which is good news. Overall very pleased in this regard. 01/31/19 on evaluation today patient presents for follow-up concerning his great toe ulcer. He was actually supposed to be here yesterday and he did come however he ended up having to leave due to his blood sugar dropping he states he just did not eat enough lunch. Fortunately seems to be doing much better today which is excellent news. Fortunately there's no evidence of infection at this time which is also good news. No fevers, chills, nausea, or vomiting noted at this time. Overall I feel like he is making excellent progress. Electronic Signature(s) Signed: 01/31/2019 5:55:30 PM By: Worthy Keeler PA-C Entered By: Worthy Keeler on 01/31/2019 13:20:07 [name] ([id number]) -------------------------------------------------------------------------------- Physical Exam Details Patient Name: [name] Date of Service: 01/31/2019 12:45 PM Medical Record Number: [id number] Patient Account Number: [id number] Date of  Birth/Sex: 1946-03-06 (73 y.o. M) Treating RN: Montey Hora Primary Care Provider: Glendon Axe Other Clinician: Referring Provider: Glendon Axe Treating Provider/Extender: STONE III, HOYT Weeks in Treatment: 7 Constitutional Well-nourished and well-hydrated in no acute distress. Respiratory normal breathing without difficulty. Psychiatric this patient is able to make decisions and demonstrates good insight into disease process. Alert and Oriented x 3. pleasant and cooperative. Notes Patient's wound bed currently shows signs of excellent granulation at this point. He has a little bit of callous overlying the surface of the wound a little bit of Slough. This did require sharp debridement today post debridement the wound bed appears to be doing much better which is excellent news. Electronic Signature(s) Signed: 01/31/2019 5:55:30 PM By: Worthy Keeler PA-C Entered By: Worthy Keeler on 01/31/2019 13:20:52 [place], [name] ([id number]) -------------------------------------------------------------------------------- Physician Orders Details Patient Name: [name] Date of Service: 01/31/2019 12:45 PM Medical Record Number: [id number] Patient Account Number: [id number] Date of Birth/Sex: 05/12/1946 (73 y.o. M) Treating RN: Montey Hora Primary Care Provider: Glendon Axe Other Clinician: Referring Provider: Glendon Axe Treating Provider/Extender: Melburn Hake, HOYT Weeks in Treatment: 7 Verbal / Phone Orders: No Diagnosis Coding ICD-10 Coding Code Description E11.621 Type 2 diabetes mellitus with foot ulcer L97.522 Non-pressure chronic ulcer of other part of left foot with fat layer exposed I10 Essential (primary) hypertension E11.43 Type 2 diabetes mellitus with diabetic autonomic (poly)neuropathy Wound Cleansing Wound #1 Left Toe Great o May shower with protection. - PLEASE DO NOT GET CAST WET Anesthetic (add to Medication List) Wound #1 Left Toe  Great o Topical Lidocaine 4% cream applied to wound bed prior to debridement (In Clinic Only). Primary Wound Dressing Wound #1 Left Toe Great o Silver Collagen Secondary Dressing Wound #1 Left Toe Great o Foam Dressing Change Frequency Wound #1 Left Toe Great o Change dressing every week Follow-up Appointments Wound #1 Left Toe Great o Return Appointment in 1 week. o Nurse Visit as needed Off-Loading Wound #1 Left Toe Great o Total Contact Cast to Left Lower Extremity Electronic Signature(s) Signed: 01/31/2019 4:52:45  PM By: Montey Hora Signed: 01/31/2019 5:55:30 PM By: Irean Hong Everett, Nesbitt ([id number]) Entered By: Montey Hora on 01/31/2019 13:17:30 [name] ([id number]) -------------------------------------------------------------------------------- Problem List Details Patient Name: [name], [name] Date of Service: 01/31/2019 12:45 PM Medical Record Number: [id number] Patient Account Number: [id number] Date of Birth/Sex: [birth year] (73 y.o. M) Treating RN: Montey Hora Primary Care Provider: Glendon Axe Other Clinician: Referring Provider: Glendon Axe Treating Provider/Extender: Melburn Hake, HOYT Weeks in Treatment: 7 Active Problems ICD-10 Evaluated Encounter Code Description Active Date Today Diagnosis E11.621 Type 2 diabetes mellitus with foot ulcer 12/12/2018 No Yes L97.522 Non-pressure chronic ulcer of other part of left foot with fat 12/12/2018 No Yes layer exposed I10 Essential (primary) hypertension 12/12/2018 No Yes E11.43 Type 2 diabetes mellitus with diabetic autonomic (poly) 12/12/2018 No Yes neuropathy Inactive Problems Resolved Problems Electronic Signature(s) Signed: 01/31/2019 5:55:30 PM By: Worthy Keeler PA-C Entered By: Worthy Keeler on 01/31/2019 13:09:59 [place], [name] ([id number]) -------------------------------------------------------------------------------- Progress  Note Details Patient Name: [name] Date of Service: 01/31/2019 12:45 PM Medical Record Number: [id number] Patient Account Number: [id number] Date of Birth/Sex: [birth year] (73 y.o. M) Treating RN: Montey Hora Primary Care Provider: Glendon Axe Other Clinician: Referring Provider: Glendon Axe Treating Provider/Extender: Melburn Hake, HOYT Weeks in Treatment: 7 Subjective Chief Complaint Information obtained from Patient Left great toe ulcer History of Present Illness (HPI) 12/12/18 on evaluation today patient presents as a referral from his endocrinologist regarding issues that he has been having with his left great toe. Subsequently he has been seeing Dr. Cleda Mccreedy and Dr. Cleda Mccreedy has been the breeding the wound and in fact this was debrided this morning. The patient had an appointment there prior to coming here. Subsequently he states that even though he's been going for regular debridement after office things really do not seem to be showing signs of improvement unfortunately. He states that he is having some discomfort but nothing too significant. No fevers, chills, nausea, or vomiting noted at this time. The patient does have a history of hypertension, neuropathy, diabetes mellitus type II, and a history of stroke. Currently he's been using bacitracin on the toe and utilizing a Band-Aid. He tells me he's had this wound for two years. He cannot remember the last time he had an x-ray. 12/19/18 on evaluation today patient appears to be doing well in regard to his plantar toe ulcer. He's been tolerating the dressing changes without complication. Fortunately there does not appear to be signs of infection at this time which is good news. No fever chills noted. 01/19/19 has been one month since I last saw the patient as he was out of town. With that being said on evaluation today it does appear that he is going to require sharp debridement and I do believe that the total contact  cast would benefit him as far as being applied at this time. He is in agreement that plan. 01/23/19 on evaluation today patient appears to be doing better in regard to his left great toe ulcer. He has been shown signs of improvement week by week and although this is slow it does seem to be doing fairly well which is good news. Overall very pleased in this regard. 01/31/19 on evaluation today patient presents for follow-up concerning his great toe ulcer. He was actually supposed to be here yesterday and he did come however he ended up having to leave due to his blood sugar dropping he states he just did not eat enough  lunch. Fortunately seems to be doing much better today which is excellent news. Fortunately there's no evidence of infection at this time which is also good news. No fevers, chills, nausea, or vomiting noted at this time. Overall I feel like he is making excellent progress. Patient History Information obtained from Patient. Family History Diabetes - Mother, Heart Disease - Father, Stroke - Father, No family history of Cancer, Hereditary Spherocytosis, Hypertension, Kidney Disease, Lung Disease, Seizures, Thyroid Problems, Tuberculosis. Social History Never smoker, Marital Status - Widowed, Alcohol Use - Never, Drug Use - No History, Caffeine Use - Moderate. Medical History Eyes ELLWYN, ERGLE ([id number]) Denies history of Cataracts, Glaucoma, Optic Neuritis Ear/Nose/Mouth/Throat Denies history of Chronic sinus problems/congestion, Middle ear problems Hematologic/Lymphatic Denies history of Anemia, Hemophilia, Human Immunodeficiency Virus, Lymphedema Respiratory Denies history of Aspiration, Asthma, Chronic Obstructive Pulmonary Disease (COPD), Pneumothorax, Sleep Apnea, Tuberculosis Cardiovascular Patient has history of Hypertension Denies history of Angina, Arrhythmia, Congestive Heart Failure, Coronary Artery Disease, Deep Vein Thrombosis, Hypotension, Myocardial  Infarction, Peripheral Arterial Disease, Peripheral Venous Disease, Phlebitis, Vasculitis Gastrointestinal Denies history of Cirrhosis , Colitis, Crohn s, Hepatitis A, Hepatitis B, Hepatitis C Endocrine Patient has history of Type II Diabetes Denies history of Type I Diabetes Genitourinary Denies history of End Stage Renal Disease Immunological Denies history of Lupus Erythematosus, Raynaud s, Scleroderma Integumentary (Skin) Denies history of History of Burn, History of pressure wounds Musculoskeletal Denies history of Gout, Rheumatoid Arthritis, Osteoarthritis, Osteomyelitis Neurologic Denies history of Dementia, Neuropathy, Quadriplegia, Paraplegia, Seizure Disorder Oncologic Denies history of Received Chemotherapy, Received Radiation Review of Systems (ROS) Constitutional Symptoms (General Health) Denies complaints or symptoms of Fever, Chills. Respiratory The patient has no complaints or symptoms. Cardiovascular The patient has no complaints or symptoms. Psychiatric The patient has no complaints or symptoms. Objective Constitutional Well-nourished and well-hydrated in no acute distress. Vitals Time Taken: 12:49 PM, Height: 72 in, Weight: 250 lbs, BMI: 33.9, Temperature: 98.3 F, Pulse: 70 bpm, Respiratory Rate: 16 breaths/min, Blood Pressure: 142/65 mmHg. Respiratory normal breathing without difficulty. DEMICHAEL, TRAUM ([id number]) Psychiatric this patient is able to make decisions and demonstrates good insight into disease process. Alert and Oriented x 3. pleasant and cooperative. General Notes: Patient's wound bed currently shows signs of excellent granulation at this point. He has a little bit of callous overlying the surface of the wound a little bit of Slough. This did require sharp debridement today post debridement the wound bed appears to be doing much better which is excellent news. Integumentary (Hair, Skin) Wound #1 status is Open. Original cause of  wound was Gradually Appeared. The wound is located on the Left Toe Great. The wound measures 0.2cm length x 0.2cm width x 0.1cm depth; 0.031cm^2 area and 0.003cm^3 volume. There is Fat Layer (Subcutaneous Tissue) Exposed exposed. There is no tunneling or undermining noted. There is a medium amount of serous drainage noted. The wound margin is thickened. There is large (67-100%) pink granulation within the wound bed. There is no necrotic tissue within the wound bed. The periwound skin appearance exhibited: Callus, Maceration. The periwound skin appearance did not exhibit: Crepitus, Excoriation, Induration, Rash, Scarring, Dry/Scaly, Atrophie Blanche, Cyanosis, Ecchymosis, Hemosiderin Staining, Mottled, Pallor, Rubor, Erythema. Periwound temperature was noted as No Abnormality. The periwound has tenderness on palpation. Assessment Active Problems ICD-10 Type 2 diabetes mellitus with foot ulcer Non-pressure chronic ulcer of other part of left foot with fat layer exposed Essential (primary) hypertension Type 2 diabetes mellitus with diabetic autonomic (poly)neuropathy Procedures Wound #1 Pre-procedure diagnosis of  Wound #1 is a Diabetic Wound/Ulcer of the Lower Extremity located on the Left Toe Great .Severity of Tissue Pre Debridement is: Fat layer exposed. There was a Excisional Skin/Subcutaneous Tissue Debridement with a total area of 0.04 sq cm performed by STONE III, HOYT E., PA-C. With the following instrument(s): Curette to remove Viable and Non-Viable tissue/material. Material removed includes Callus, Subcutaneous Tissue, and Slough after achieving pain control using Lidocaine 4% Topical Solution. No specimens were taken. A time out was conducted at 13:14, prior to the start of the procedure. A Minimum amount of bleeding was controlled with Pressure. The procedure was tolerated well with a pain level of 0 throughout and a pain level of 0 following the procedure. Post  Debridement Measurements: 0.2cm length x 0.2cm width x 0.2cm depth; 0.006cm^3 volume. Character of Wound/Ulcer Post Debridement is improved. Severity of Tissue Post Debridement is: Fat layer exposed. Post procedure Diagnosis Wound #1: Same as Pre-Procedure Pre-procedure diagnosis of Wound #1 is a Diabetic Wound/Ulcer of the Lower Extremity located on the Left Toe Great . There was a Total Contact Cast Procedure by STONE III, HOYT E., PA-C. Post procedure Diagnosis Wound #1: Same as Pre-Procedure ZAILEN, ALBARRAN ([id number]) Plan Wound Cleansing: Wound #1 Left Toe Great: May shower with protection. - PLEASE DO NOT GET CAST WET Anesthetic (add to Medication List): Wound #1 Left Toe Great: Topical Lidocaine 4% cream applied to wound bed prior to debridement (In Clinic Only). Primary Wound Dressing: Wound #1 Left Toe Great: Silver Collagen Secondary Dressing: Wound #1 Left Toe Great: Foam Dressing Change Frequency: Wound #1 Left Toe Great: Change dressing every week Follow-up Appointments: Wound #1 Left Toe Great: Return Appointment in 1 week. Nurse Visit as needed Off-Loading: Wound #1 Left Toe Great: Total Contact Cast to Left Lower Extremity At this point I did go ahead and reapply the total contact cast today which I think is beneficial for him. Subsequently we did switch to a collagen-based dressing in order to help with allowing this area to heal hopefully more quickly. Overall I feel like he is progressing quite nicely and I'm hopeful that this will continue to be the case. If anything changes or worsens in the meantime patient will contact the office and let me know. Please see above for specific wound care orders. We will see patient for re-evaluation in 1 week(s) here in the clinic. If anything worsens or changes patient will contact our office for additional recommendations. Electronic Signature(s) Signed: 01/31/2019 5:55:30 PM By: Worthy Keeler PA-C Entered By:  Worthy Keeler on 01/31/2019 13:21:23 [name] ([id number]) -------------------------------------------------------------------------------- ROS/PFSH Details Patient Name: [name] Date of Service: 01/31/2019 12:45 PM Medical Record Number: [id number] Patient Account Number: [id number] Date of Birth/Sex: 08-06-1946 (73 y.o. M) Treating RN: Montey Hora Primary Care Provider: Glendon Axe Other Clinician: Referring Provider: Glendon Axe Treating Provider/Extender: STONE III, HOYT Weeks in Treatment: 7 Information Obtained From Patient Wound History Do you currently have one or more open woundso No Have you tested positive for osteomyelitis (bone infection)o No Have you had any tests for circulation on your legso No Constitutional Symptoms (General Health) Complaints and Symptoms: Negative for: Fever; Chills Eyes Medical History: Negative for: Cataracts; Glaucoma; Optic Neuritis Ear/Nose/Mouth/Throat Medical History: Negative for: Chronic sinus problems/congestion; Middle ear problems Hematologic/Lymphatic Medical History: Negative for: Anemia; Hemophilia; Human Immunodeficiency Virus; Lymphedema Respiratory Complaints and Symptoms: No Complaints or Symptoms Medical History: Negative for: Aspiration; Asthma; Chronic Obstructive Pulmonary Disease (COPD); Pneumothorax; Sleep Apnea; Tuberculosis Cardiovascular  Complaints and Symptoms: No Complaints or Symptoms Medical History: Positive for: Hypertension Negative for: Angina; Arrhythmia; Congestive Heart Failure; Coronary Artery Disease; Deep Vein Thrombosis; Hypotension; Myocardial Infarction; Peripheral Arterial Disease; Peripheral Venous Disease; Phlebitis; Vasculitis Gastrointestinal [name], [name] ([id number]) Medical History: Negative for: Cirrhosis ; Colitis; Crohnos; Hepatitis A; Hepatitis B; Hepatitis C Endocrine Medical History: Positive for: Type II Diabetes Negative  for: Type I Diabetes Time with diabetes: 1990 Treated with: Insulin Blood sugar tested every day: Yes Tested : Genitourinary Medical History: Negative for: End Stage Renal Disease Immunological Medical History: Negative for: Lupus Erythematosus; Raynaudos; Scleroderma Integumentary (Skin) Medical History: Negative for: History of Burn; History of pressure wounds Musculoskeletal Medical History: Negative for: Gout; Rheumatoid Arthritis; Osteoarthritis; Osteomyelitis Neurologic Medical History: Negative for: Dementia; Neuropathy; Quadriplegia; Paraplegia; Seizure Disorder Oncologic Medical History: Negative for: Received Chemotherapy; Received Radiation Psychiatric Complaints and Symptoms: No Complaints or Symptoms Immunizations Pneumococcal Vaccine: Received Pneumococcal Vaccination: Yes Implantable Devices Family and Social History Cancer: No; Diabetes: Yes - Mother; Heart Disease: Yes - Father; Hereditary Spherocytosis: No; Hypertension: No; Kidney Disease: No; Lung Disease: No; Seizures: No; Stroke: Yes - Father; Thyroid Problems: No; Tuberculosis: No; Never smoker; Marital Status - Widowed; Alcohol Use: Never; Drug Use: No History; Caffeine Use: Moderate; Financial Concerns: No; Food, Clothing or Shelter Needs: No; Support System Lacking: No; Transportation Concerns: No; Advanced Directives: Yes [name], [name] ([id number]) (Not Provided); Patient does not want information on Advanced Directives; Do not resuscitate: Yes (Not Provided); Living Will: Yes (Not Provided); Medical Power of Attorney: Yes (Not Provided) Physician Affirmation I have reviewed and agree with the above information. Electronic Signature(s) Signed: 01/31/2019 4:52:45 PM By: Montey Hora Signed: 01/31/2019 5:55:30 PM By: Worthy Keeler PA-C Entered By: Worthy Keeler on 01/31/2019 13:20:22 [name]  ([id number]) -------------------------------------------------------------------------------- Total Contact Cast Details Patient Name: [name], [name]. Date of Service: 01/31/2019 12:45 PM Medical Record Number: [id number] Patient Account Number: [id number] Date of Birth/Sex: [birth year] (73 y.o. M) Treating RN: Montey Hora Primary Care Provider: Glendon Axe Other Clinician: Referring Provider: Glendon Axe Treating Provider/Extender: STONE III, HOYT Weeks in Treatment: 7 Total Contact Cast Applied for Wound Assessment: Wound #1 Left Toe Great Performed By: Physician Emilio Math., PA-C Post Procedure Diagnosis Same as Pre-procedure Electronic Signature(s) Signed: 01/31/2019 4:52:45 PM By: Montey Hora Signed: 01/31/2019 5:55:30 PM By: Worthy Keeler PA-C Entered By: Montey Hora on 01/31/2019 13:17:49 [place] ([id number]) -------------------------------------------------------------------------------- SuperBill Details Patient Name: [name]. Date of Service: 01/31/2019 Medical Record Number: [id number] Patient Account Number: [id number] Date of Birth/Sex: [birth year] (73 y.o. 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

## 2019-02-03 NOTE — Progress Notes (Signed)
[name], [name] ([id number]) Visit Report for 01/31/2019 Arrival Information Details Patient Name: [name], [name]. Date of Service: 01/31/2019 12:45 PM Medical Record Number: [id number] Patient Account Number: [id number] Date of Birth/Sex: [birth year] (73 y.o. M) Treating RN: Montey Hora Primary Care Tenley Winward: Glendon Axe Other Clinician: Referring Hassell Patras: Glendon Axe Treating Tannor Pyon/Extender: Melburn Hake, HOYT Weeks in Treatment: 7 Visit Information History Since Last Visit Added or deleted any medications: No Patient Arrived: Cane Any new allergies or adverse reactions: No Arrival Time: 12:48 Had a fall or experienced change in No Accompanied By: self activities of daily living that may affect Transfer Assistance: None risk of falls: Patient Identification Verified: Yes Signs or symptoms of abuse/neglect since last No Secondary Verification Process Completed: Yes visito Patient Has Alerts: Yes Hospitalized since last visit: No Patient Alerts: DMII Implantable device outside of the clinic No excluding cellular tissue based products placed in the center since last visit: Has Dressing in Place as Prescribed: Yes Has Footwear/Offloading in Place as Yes Prescribed: Left: Removable Cast Walker/Walking Boot Pain Present Now: No Electronic Signature(s) Signed: 01/31/2019 4:14:38 PM By: Lorine Bears RCP, RRT, CHT Entered By: Lorine Bears on 01/31/2019 12:49:22 [name] ([id number]) -------------------------------------------------------------------------------- Lower Extremity Assessment Details Patient Name: [name] Date of Service: 01/31/2019 12:45 PM Medical Record Number: [id number] Patient Account Number: [id number] Date of Birth/Sex: 1946/10/11 (73 y.o. M) Treating RN: Harold Barban Primary Care Brandan Robicheaux: Glendon Axe Other Clinician: Referring Philip Eckersley: Glendon Axe Treating  Gracy Ehly/Extender: STONE III, HOYT Weeks in Treatment: 7 Edema Assessment Assessed: [Left: No] [Right: No] Edema: [Left: N] [Right: o] Calf Left: Right: Point of Measurement: 33 cm From Medial Instep 36.5 cm cm Ankle Left: Right: Point of Measurement: 13 cm From Medial Instep 21.5 cm cm Vascular Assessment Pulses: Dorsalis Pedis Palpable: [Left:Yes] Posterior Tibial Palpable: [Left:Yes] Extremity colors, hair growth, and conditions: Extremity Color: [Left:Normal] Hair Growth on Extremity: [Left:No] Temperature of Extremity: [Left:Warm] Capillary Refill: [Left:< 3 seconds] Toe Nail Assessment Left: Right: Thick: No Discolored: No Deformed: No Improper Length and Hygiene: No Electronic Signature(s) Signed: 02/02/2019 11:43:02 AM By: Harold Barban Entered By: Harold Barban on 01/31/2019 13:08:03 [name] ([id number]) -------------------------------------------------------------------------------- Multi Wound Chart Details Patient Name: [name]. Date of Service: 01/31/2019 12:45 PM Medical Record Number: [id number] Patient Account Number: [id number] Date of Birth/Sex: [birth year] (73 y.o. M) Treating RN: Montey Hora Primary Care Lucee Brissett: Glendon Axe Other Clinician: Referring Chizaram Latino: Glendon Axe Treating Kately Graffam/Extender: STONE III, HOYT Weeks in Treatment: 7 Vital Signs Height(in): 72 Pulse(bpm): 70 Weight(lbs): 250 Blood Pressure(mmHg): 142/65 Body Mass Index(BMI): 34 Temperature(F): 98.3 Respiratory Rate 16 (breaths/min): Photos: [1:No Photos] [N/A:N/A] Wound Location: [1:Left Toe Great] [N/A:N/A] Wounding Event: [1:Gradually Appeared] [N/A:N/A] Primary Etiology: [1:Diabetic Wound/Ulcer of the N/A Lower Extremity] Comorbid History: [1:Hypertension, Type II Diabetes N/A] Date Acquired: [1:11/30/2017] [N/A:N/A] Weeks of Treatment: [1:7] [N/A:N/A] Wound Status: [1:Open] [N/A:N/A] Measurements L x W x D [1:0.2x0.2x0.1]  [N/A:N/A] (cm) Area (cm) : [1:0.031] [N/A:N/A] Volume (cm) : [1:0.003] [N/A:N/A] % Reduction in Area: [1:95.10%] [N/A:N/A] % Reduction in Volume: [1:95.30%] [N/A:N/A] Classification: [1:Grade 2] [N/A:N/A] Exudate Amount: [1:Medium] [N/A:N/A] Exudate Type: [1:Serous] [N/A:N/A] Exudate Color: [1:amber] [N/A:N/A] Wound Margin: [1:Thickened] [N/A:N/A] Granulation Amount: [1:Large (67-100%)] [N/A:N/A] Granulation Quality: [1:Pink] [N/A:N/A] Necrotic Amount: [1:None Present (0%)] [N/A:N/A] Exposed Structures: [1:Fat Layer (Subcutaneous Tissue) Exposed: Yes Fascia: No Tendon: No Muscle: No Joint: No Bone: No] [N/A:N/A] Epithelialization: [1:Small (1-33%)] [N/A:N/A] Periwound Skin Texture: [1:Callus: Yes Excoriation: No Induration: No Crepitus: No Rash: No Scarring: No] [N/A:N/A] Periwound  Skin Moisture: Maceration: Yes N/A N/A Dry/Scaly: No Periwound Skin Color: Atrophie Blanche: No N/A N/A Cyanosis: No Ecchymosis: No Erythema: No Hemosiderin Staining: No Mottled: No Pallor: No Rubor: No Temperature: No Abnormality N/A N/A Tenderness on Palpation: Yes N/A N/A Wound Preparation: Ulcer Cleansing: N/A N/A Rinsed/Irrigated with Saline, Other: soap and water Topical Anesthetic Applied: Other: lidocaine 4% Treatment Notes Electronic Signature(s) Signed: 01/31/2019 4:52:45 PM By: Montey Hora Entered By: Montey Hora on 01/31/2019 13:13:56 [place], [name] ([id number]) -------------------------------------------------------------------------------- [place] Details Patient Name: [name] Date of Service: 01/31/2019 12:45 PM Medical Record Number: [id number] Patient Account Number: [id number] Date of Birth/Sex: [birth year] (73 y.o. M) Treating RN: Montey Hora Primary Care Alexie Lanni: Glendon Axe Other Clinician: Referring Jakylah Bassinger: Glendon Axe Treating Reise Gladney/Extender: STONE III, HOYT Weeks in Treatment: 7 Active  Inactive Abuse / Safety / Falls / Self Care Management Nursing Diagnoses: Potential for falls Goals: Patient will remain injury free related to falls Date Initiated: 01/19/2019 Target Resolution Date: 04/01/2019 Goal Status: Active Interventions: Assess fall risk on admission and as needed Notes: Orientation to the Wound Care Program Nursing Diagnoses: Knowledge deficit related to the wound healing center program Goals: Patient/caregiver will verbalize understanding of the [place] Program Date Initiated: 01/19/2019 Target Resolution Date: 04/01/2019 Goal Status: Active Interventions: Provide education on orientation to the wound center Notes: Wound/Skin Impairment Nursing Diagnoses: Impaired tissue integrity Goals: Ulcer/skin breakdown will have a volume reduction of 30% by week 4 Date Initiated: 12/12/2018 Target Resolution Date: 01/12/2019 Goal Status: Active Interventions: Assess patient/caregiver ability to obtain necessary supplies FENIX, RORKE ([id number]) Assess patient/caregiver ability to perform ulcer/skin care regimen upon admission and as needed Notes: Electronic Signature(s) Signed: 01/31/2019 4:52:45 PM By: Montey Hora Entered By: Montey Hora on 01/31/2019 13:13:42 [place], [name] ([id number]) -------------------------------------------------------------------------------- Pain Assessment Details Patient Name: [name]. Date of Service: 01/31/2019 12:45 PM Medical Record Number: [id number] Patient Account Number: [id number] Date of Birth/Sex: 06-20-1946 (73 y.o. M) Treating RN: Montey Hora Primary Care Irlene Crudup: Glendon Axe Other Clinician: Referring Randell Teare: Glendon Axe Treating Jerrett Baldinger/Extender: STONE III, HOYT Weeks in Treatment: 7 Active Problems Location of Pain Severity and Description of Pain Patient Has Paino No Site Locations Pain Management and Medication Current Pain  Management: Electronic Signature(s) Signed: 01/31/2019 4:14:38 PM By: Paulla Fore, RRT, CHT Signed: 01/31/2019 4:52:45 PM By: Montey Hora Entered By: Lorine Bears on 01/31/2019 12:49:31 [place], [name] ([id number]) -------------------------------------------------------------------------------- Patient/Caregiver Education Details Patient Name: [name]. Date of Service: 01/31/2019 12:45 PM Medical Record Number: [id number] Patient Account Number: [id number] Date of Birth/Gender: [birth year] (73 y.o. M) Treating RN: Montey Hora Primary Care Physician: Glendon Axe Other Clinician: Referring Physician: Glendon Axe Treating Physician/Extender: Sharalyn Ink in Treatment: 7 Education Assessment Education Provided To: Patient Education Topics Provided Offloading: Handouts: Other: TCC precautions Methods: Explain/Verbal Responses: State content correctly Electronic Signature(s) Signed: 01/31/2019 4:52:45 PM By: Montey Hora Entered By: Montey Hora on 01/31/2019 13:18:36 [place], [name] ([id number]) -------------------------------------------------------------------------------- Wound Assessment Details Patient Name: [name]. Date of Service: 01/31/2019 12:45 PM Medical Record Number: [id number] Patient Account Number: [id number] Date of Birth/Sex: 11/22/1946 (73 y.o. M) Treating RN: Harold Barban Primary Care Tihanna Goodson: Glendon Axe Other Clinician: Referring Thara Searing: Glendon Axe Treating Charels Stambaugh/Extender: STONE III, HOYT Weeks in Treatment: 7 Wound Status Wound Number: 1 Primary Etiology: Diabetic Wound/Ulcer of the Lower Extremity Wound Location: Left Toe Great Wound Status: Open Wounding Event: Gradually Appeared Comorbid Hypertension, Type II Diabetes Date Acquired: 11/30/2017 History:  Weeks Of Treatment: 7 Clustered Wound: No Photos Photo Uploaded By: Gretta Cool, BSN, RN,  CWS, Kim on 01/31/2019 13:16:11 Wound Measurements Length: (cm) 0.2 Width: (cm) 0.2 Depth: (cm) 0.1 Area: (cm) 0.031 Volume: (cm) 0.003 % Reduction in Area: 95.1% % Reduction in Volume: 95.3% Epithelialization: Small (1-33%) Tunneling: No Undermining: No Wound Description Classification: Grade 2 Foul Odor Wound Margin: Thickened Slough/Fi Exudate Amount: Medium Exudate Type: Serous Exudate Color: amber After Cleansing: No brino Yes Wound Bed Granulation Amount: Large (67-100%) Exposed Structure Granulation Quality: Pink Fascia Exposed: No Necrotic Amount: None Present (0%) Fat Layer (Subcutaneous Tissue) Exposed: Yes Tendon Exposed: No Muscle Exposed: No Joint Exposed: No Bone Exposed: No Periwound Skin Texture Bobo, Xsavier L. ([id number]) Texture Color No Abnormalities Noted: No No Abnormalities Noted: No Callus: Yes Atrophie Blanche: No Crepitus: No Cyanosis: No Excoriation: No Ecchymosis: No Induration: No Erythema: No Rash: No Hemosiderin Staining: No Scarring: No Mottled: No Pallor: No Moisture Rubor: No No Abnormalities Noted: No Dry / Scaly: No Temperature / Pain Maceration: Yes Temperature: No Abnormality Tenderness on Palpation: Yes Wound Preparation Ulcer Cleansing: Rinsed/Irrigated with Saline, Other: soap and water, Topical Anesthetic Applied: Other: lidocaine 4%, Treatment Notes Wound #1 (Left Toe Great) Notes prisma, foam and TCC Electronic Signature(s) Signed: 02/02/2019 11:43:02 AM By: Harold Barban Entered By: Harold Barban on 01/31/2019 13:05:08 [name] ([id number]) -------------------------------------------------------------------------------- Vitals Details Patient Name: [name]. Date of Service: 01/31/2019 12:45 PM Medical Record Number: [id number] Patient Account Number: [id number] Date of Birth/Sex: [birth year] (73 y.o. M) Treating RN: Montey Hora Primary Care Dantavious Snowball: Glendon Axe Other Clinician: Referring Yanilen Adamik: Glendon Axe Treating Basem Yannuzzi/Extender: STONE III, HOYT Weeks in Treatment: 7 Vital Signs Time Taken: 12:49 Temperature (F): 98.3 Height (in): 72 Pulse (bpm): 70 Weight (lbs): 250 Respiratory Rate (breaths/min): 16 Body Mass Index (BMI): 33.9 Blood Pressure (mmHg): 142/65 Reference Range: 80 - 120 mg / dl Airway Electronic Signature(s) Signed: 01/31/2019 4:14:38 PM By: Lorine Bears RCP, RRT, CHT Entered By: Lorine Bears on 01/31/2019 12:51:53

## 2019-02-06 ENCOUNTER — Encounter: Payer: Medicare Other | Admitting: Physician Assistant

## 2019-02-06 DIAGNOSIS — E11621 Type 2 diabetes mellitus with foot ulcer: Secondary | ICD-10-CM | POA: Diagnosis not present

## 2019-02-08 NOTE — Progress Notes (Signed)
[name], [name] ([id number]) Visit Report for 02/06/2019 Arrival Information Details Patient Name: [name], STUCK. Date of Service: 02/06/2019 1:15 PM Medical Record Number: [id number] Patient Account Number: [id number] Date of Birth/Sex: 12/01/1946 (73 y.o. M) Treating RN: Cornell Barman Primary Care Handsome Anglin: Glendon Axe Other Clinician: Referring Amberia Bayless: Glendon Axe Treating Nazaiah Navarrete/Extender: Melburn Hake, HOYT Weeks in Treatment: 8 Visit Information History Since Last Visit Added or deleted any medications: No Patient Arrived: Cane Any new allergies or adverse reactions: No Arrival Time: 12:58 Had a fall or experienced change in No Accompanied By: self activities of daily living that may affect Transfer Assistance: None risk of falls: Patient Identification Verified: Yes Signs or symptoms of abuse/neglect since last visito No Secondary Verification Process Completed: Yes Hospitalized since last visit: No Patient Has Alerts: Yes Implantable device outside of the clinic excluding No Patient Alerts: DMII cellular tissue based products placed in the center since last visit: Has Dressing in Place as Prescribed: Yes Pain Present Now: No Electronic Signature(s) Signed: 02/06/2019 4:24:19 PM By: Lorine Bears RCP, RRT, CHT Entered By: Lorine Bears on 02/06/2019 12:58:53 [name] ([id number]) -------------------------------------------------------------------------------- Encounter Discharge Information Details Patient Name: [name]. Date of Service: 02/06/2019 1:15 PM Medical Record Number: [id number] Patient Account Number: [id number] Date of Birth/Sex: 1946-03-14 (73 y.o. M) Treating RN: Montey Hora Primary Care Shady Bradish: Glendon Axe Other Clinician: Referring Flor Whitacre: Glendon Axe Treating Tuwanda Vokes/Extender: STONE III, HOYT Weeks in Treatment: 8 Encounter Discharge Information Items Post  Procedure Vitals Discharge Condition: Stable Temperature (F): 98.1 Ambulatory Status: Ambulatory Pulse (bpm): 67 Discharge Destination: Home Respiratory Rate (breaths/min): 16 Transportation: Private Auto Blood Pressure (mmHg): 139/74 Accompanied By: self Schedule Follow-up Appointment: Yes Clinical Summary of Care: Electronic Signature(s) Signed: 02/06/2019 5:02:11 PM By: Montey Hora Entered By: Montey Hora on 02/06/2019 14:11:55 [name], [name] ([id number]) -------------------------------------------------------------------------------- Lower Extremity Assessment Details Patient Name: [name] Date of Service: 02/06/2019 1:15 PM Medical Record Number: [id number] Patient Account Number: [id number] Date of Birth/Sex: April 26, 1946 (73 y.o. M) Treating RN: Army Melia Primary Care Kinze Labo: Glendon Axe Other Clinician: Referring Christian Borgerding: Glendon Axe Treating Lucillie Kiesel/Extender: STONE III, HOYT Weeks in Treatment: 8 Edema Assessment Assessed: [Left: No] [Right: No] [Left: Edema] [Right: :] Calf Left: Right: Point of Measurement: 33 cm From Medial Instep 36.5 cm cm Ankle Left: Right: Point of Measurement: 13 cm From Medial Instep 22 cm cm Vascular Assessment Pulses: Dorsalis Pedis Palpable: [Left:Yes] Posterior Tibial Extremity colors, hair growth, and conditions: Extremity Color: [Left:Normal] Hair Growth on Extremity: [Left:No] Temperature of Extremity: [Left:Warm] Capillary Refill: [Left:< 3 seconds] Toe Nail Assessment Left: Right: Thick: No Discolored: No Deformed: No Improper Length and Hygiene: No Electronic Signature(s) Signed: 02/06/2019 4:51:21 PM By: Army Melia Entered By: Army Melia on 02/06/2019 13:20:53 [name], [name] ([id number]) -------------------------------------------------------------------------------- Multi Wound Chart Details Patient Name: [name]. Date of Service: 02/06/2019 1:15  PM Medical Record Number: [id number] Patient Account Number: [id number] Date of Birth/Sex: Jul 07, 1946 (73 y.o. M) Treating RN: Montey Hora Primary Care Kimo Bancroft: Glendon Axe Other Clinician: Referring Montana Bryngelson: Glendon Axe Treating Charlot Gouin/Extender: STONE III, HOYT Weeks in Treatment: 8 Vital Signs Height(in): 72 Pulse(bpm): 16 Weight(lbs): 250 Blood Pressure(mmHg): 139/74 Body Mass Index(BMI): 34 Temperature(F): 98.1 Respiratory Rate 16 (breaths/min): Photos: [1:No Photos] [N/A:N/A] Wound Location: [1:Left Toe Great] [N/A:N/A] Wounding Event: [1:Gradually Appeared] [N/A:N/A] Primary Etiology: [1:Diabetic Wound/Ulcer of the N/A Lower Extremity] Comorbid History: [1:Hypertension, Type II Diabetes N/A] Date Acquired: [1:11/30/2017] [N/A:N/A] Weeks of Treatment: [1:8] [N/A:N/A] Wound Status: [1:Open] [N/A:N/A] Measurements L  x W x D [1:0.2x0.2x0.1] [N/A:N/A] (cm) Area (cm) : [1:0.031] [N/A:N/A] Volume (cm) : [1:0.003] [N/A:N/A] % Reduction in Area: [1:95.10%] [N/A:N/A] % Reduction in Volume: [1:95.30%] [N/A:N/A] Classification: [1:Grade 2] [N/A:N/A] Exudate Amount: [1:Medium] [N/A:N/A] Exudate Type: [1:Serous] [N/A:N/A] Exudate Color: [1:amber] [N/A:N/A] Wound Margin: [1:Thickened] [N/A:N/A] Granulation Amount: [1:Large (67-100%)] [N/A:N/A] Granulation Quality: [1:Pink] [N/A:N/A] Necrotic Amount: [1:None Present (0%)] [N/A:N/A] Exposed Structures: [1:Fat Layer (Subcutaneous Tissue) Exposed: Yes Fascia: No Tendon: No Muscle: No Joint: No Bone: No] [N/A:N/A] Epithelialization: [1:Small (1-33%)] [N/A:N/A] Periwound Skin Texture: [1:Callus: Yes Excoriation: No Induration: No Crepitus: No Rash: No Scarring: No] [N/A:N/A] Periwound Skin Moisture: Maceration: Yes N/A N/A Dry/Scaly: No Periwound Skin Color: Atrophie Blanche: No N/A N/A Cyanosis: No Ecchymosis: No Erythema: No Hemosiderin Staining: No Mottled: No Pallor: No Rubor: No Temperature: No  Abnormality N/A N/A Tenderness on Palpation: Yes N/A N/A Wound Preparation: Ulcer Cleansing: N/A N/A Rinsed/Irrigated with Saline, Other: soap and water Topical Anesthetic Applied: None Treatment Notes Electronic Signature(s) Signed: 02/06/2019 5:02:11 PM By: Montey Hora Entered By: Montey Hora on 02/06/2019 13:49:35 [name], [name] ([id number]) -------------------------------------------------------------------------------- [place] Details Patient Name: [name]. Date of Service: 02/06/2019 1:15 PM Medical Record Number: [id number] Patient Account Number: [id number] Date of Birth/Sex: 1946-08-16 (73 y.o. M) Treating RN: Montey Hora Primary Care Glenda Spelman: Glendon Axe Other Clinician: Referring Camillia Marcy: Glendon Axe Treating Kashawn Dirr/Extender: STONE III, HOYT Weeks in Treatment: 8 Active Inactive Abuse / Safety / Falls / Self Care Management Nursing Diagnoses: Potential for falls Goals: Patient will remain injury free related to falls Date Initiated: 01/19/2019 Target Resolution Date: 04/01/2019 Goal Status: Active Interventions: Assess fall risk on admission and as needed Notes: Orientation to the Wound Care Program Nursing Diagnoses: Knowledge deficit related to the wound healing center program Goals: Patient/caregiver will verbalize understanding of the [facility] Date Initiated: 01/19/2019 Target Resolution Date: 04/01/2019 Goal Status: Active Interventions: Provide education on orientation to the wound center Notes: Wound/Skin Impairment Nursing Diagnoses: Impaired tissue integrity Goals: Ulcer/skin breakdown will have a volume reduction of 30% by week 4 Date Initiated: 12/12/2018 Target Resolution Date: 01/12/2019 Goal Status: Active Interventions: Assess patient/caregiver ability to obtain necessary supplies JAQUISE, FAUX ([id number]) Assess patient/caregiver ability to perform  ulcer/skin care regimen upon admission and as needed Notes: Electronic Signature(s) Signed: 02/06/2019 5:02:11 PM By: Montey Hora Entered By: Montey Hora on 02/06/2019 13:49:25 [name], [name] ([id number]) -------------------------------------------------------------------------------- Pain Assessment Details Patient Name: [name]. Date of Service: 02/06/2019 1:15 PM Medical Record Number: [id number] Patient Account Number: [id number] Date of Birth/Sex: 07-14-1946 (73 y.o. M) Treating RN: Cornell Barman Primary Care Leona Pressly: Glendon Axe Other Clinician: Referring Jaleena Viviani: Glendon Axe Treating Ethon Wymer/Extender: STONE III, HOYT Weeks in Treatment: 8 Active Problems Location of Pain Severity and Description of Pain Patient Has Paino No Site Locations Pain Management and Medication Current Pain Management: Electronic Signature(s) Signed: 02/06/2019 4:24:19 PM By: Lorine Bears RCP, RRT, CHT Signed: 02/07/2019 11:53:49 AM By: Gretta Cool, BSN, RN, CWS, Kim RN, BSN Entered By: Lorine Bears on 02/06/2019 12:59:01 [name] ([id number]) -------------------------------------------------------------------------------- Patient/Caregiver Education Details Patient Name: [name] Date of Service: 02/06/2019 1:15 PM Medical Record Number: [id number] Patient Account Number: [id number] Date of Birth/Gender: 07-16-1946 (73 y.o. M) Treating RN: Montey Hora Primary Care Physician: Glendon Axe Other Clinician: Referring Physician: Glendon Axe Treating Physician/Extender: Sharalyn Ink in Treatment: 8 Education Assessment Education Provided To: Patient Education Topics Provided Offloading: Handouts: Other: ongoing offloading Methods: Explain/Verbal Responses: State content correctly Electronic  Signature(s) Signed: 02/06/2019 5:02:11 PM By: Montey Hora Entered By: Montey Hora on 02/06/2019  13:52:07 [name] ([id number]) -------------------------------------------------------------------------------- Wound Assessment Details Patient Name: [name] Date of Service: 02/06/2019 1:15 PM Medical Record Number: [id number] Patient Account Number: [id number] Date of Birth/Sex: Feb 28, 1946 (73 y.o. M) Treating RN: Army Melia Primary Care Malisha Mabey: Glendon Axe Other Clinician: Referring Charolett Yarrow: Glendon Axe Treating Sion Reinders/Extender: STONE III, HOYT Weeks in Treatment: 8 Wound Status Wound Number: 1 Primary Etiology: Diabetic Wound/Ulcer of the Lower Extremity Wound Location: Left Toe Great Wound Status: Open Wounding Event: Gradually Appeared Comorbid Hypertension, Type II Diabetes Date Acquired: 11/30/2017 History: Weeks Of Treatment: 8 Clustered Wound: No Wound Measurements Length: (cm) 0.2 Width: (cm) 0.2 Depth: (cm) 0.1 Area: (cm) 0.031 Volume: (cm) 0.003 % Reduction in Area: 95.1% % Reduction in Volume: 95.3% Epithelialization: Small (1-33%) Tunneling: No Undermining: No Wound Description Classification: Grade 2 Foul Od Wound Margin: Thickened Slough/ Exudate Amount: Medium Exudate Type: Serous Exudate Color: amber or After Cleansing: No Fibrino Yes Wound Bed Granulation Amount: Large (67-100%) Exposed Structure Granulation Quality: Pink Fascia Exposed: No Necrotic Amount: None Present (0%) Fat Layer (Subcutaneous Tissue) Exposed: Yes Tendon Exposed: No Muscle Exposed: No Joint Exposed: No Bone Exposed: No Periwound Skin Texture Texture Color No Abnormalities Noted: No No Abnormalities Noted: No Callus: Yes Atrophie Blanche: No Crepitus: No Cyanosis: No Excoriation: No Ecchymosis: No Induration: No Erythema: No Rash: No Hemosiderin Staining: No Scarring: No Mottled: No Pallor: No Moisture Rubor: No No Abnormalities Noted: No Dry / Scaly: No Temperature / Pain Maceration: Yes Temperature: No  Abnormality Tenderness on Palpation: Yes ARLEN, DUPUIS ([id number]) Wound Preparation Ulcer Cleansing: Rinsed/Irrigated with Saline, Other: soap and water, Topical Anesthetic Applied: None Treatment Notes Wound #1 (Left Toe Great) Notes prisma, foam and TCC Electronic Signature(s) Signed: 02/06/2019 4:51:21 PM By: Army Melia Entered By: Army Melia on 02/06/2019 13:19:19 Smoot, [name] ([id number]) -------------------------------------------------------------------------------- Vitals Details Patient Name: [name]. Date of Service: 02/06/2019 1:15 PM Medical Record Number: [id number] Patient Account Number: [id number] Date of Birth/Sex: July 05, 1946 (73 y.o. M) Treating RN: Cornell Barman Primary Care Davi Rotan: Glendon Axe Other Clinician: Referring Sherman Donaldson: Glendon Axe Treating Wil Slape/Extender: STONE III, HOYT Weeks in Treatment: 8 Vital Signs Time Taken: 12:59 Temperature (F): 98.1 Height (in): 72 Pulse (bpm): 67 Weight (lbs): 250 Respiratory Rate (breaths/min): 16 Body Mass Index (BMI): 33.9 Blood Pressure (mmHg): 139/74 Reference Range: 80 - 120 mg / dl Airway Electronic Signature(s) Signed: 02/06/2019 4:24:19 PM By: Lorine Bears RCP, RRT, CHT Entered By: Lorine Bears on 02/06/2019 13:01:18

## 2019-02-08 NOTE — Progress Notes (Signed)
SHYAM, DAWSON ([id number]) Visit Report for 02/06/2019 Chief Complaint Document Details Patient Name: [name], [name]. Date of Service: 02/06/2019 1:15 PM Medical Record Number: [id number] Patient Account Number: [id number] Date of Birth/Sex: 08-11-1946 (73 y.o. M) Treating RN: Cornell Barman Primary Care Provider: Glendon Axe Other Clinician: Referring Provider: Glendon Axe Treating Provider/Extender: Melburn Hake, HOYT Weeks in Treatment: 8 Information Obtained from: Patient Chief Complaint Left great toe ulcer Electronic Signature(s) Signed: 02/07/2019 5:08:21 PM By: Worthy Keeler PA-C Entered By: Worthy Keeler on 02/06/2019 13:13:32 [place], [name] ([id number]) -------------------------------------------------------------------------------- Debridement Details Patient Name: [name]. Date of Service: 02/06/2019 1:15 PM Medical Record Number: [id number] Patient Account Number: [id number] Date of Birth/Sex: 09-18-1946 (73 y.o. M) Treating RN: Montey Hora Primary Care Provider: Glendon Axe Other Clinician: Referring Provider: Glendon Axe Treating Provider/Extender: STONE III, HOYT Weeks in Treatment: 8 Debridement Performed for Wound #1 Left Toe Great Assessment: Performed By: Physician STONE III, HOYT E., PA-C Debridement Type: Debridement Severity of Tissue Pre Fat layer exposed Debridement: Level of Consciousness (Pre- Awake and Alert procedure): Pre-procedure Verification/Time Yes - 13:46 Out Taken: Start Time: 13:46 Pain Control: Lidocaine 4% Topical Solution Total Area Debrided (L x W): 0.2 (cm) x 0.2 (cm) = 0.04 (cm) Tissue and other material Callus, Slough, Subcutaneous, Fibrin/Exudate, Slough debrided: Level: Skin/Subcutaneous Tissue Debridement Description: Excisional Instrument: Curette Bleeding: Minimum Hemostasis Achieved: Pressure End Time: 13:49 Procedural Pain: 0 Post Procedural Pain: 0 Response to  Treatment: Procedure was tolerated well Level of Consciousness Awake and Alert (Post-procedure): Post Debridement Measurements of Total Wound Length: (cm) 0.1 Width: (cm) 0.1 Depth: (cm) 0.1 Volume: (cm) 0.001 Character of Wound/Ulcer Post Debridement: Improved Severity of Tissue Post Debridement: Fat layer exposed Post Procedure Diagnosis Same as Pre-procedure Electronic Signature(s) Signed: 02/06/2019 5:02:11 PM By: Montey Hora Signed: 02/07/2019 5:08:21 PM By: Worthy Keeler PA-C Entered By: Montey Hora on 02/06/2019 13:50:56 [place], [name] ([id number]) -------------------------------------------------------------------------------- HPI Details Patient Name: [name] Date of Service: 02/06/2019 1:15 PM Medical Record Number: [id number] Patient Account Number: [id number] Date of Birth/Sex: 05-Sep-1946 (73 y.o. M) Treating RN: Cornell Barman Primary Care Provider: Glendon Axe Other Clinician: Referring Provider: Glendon Axe Treating Provider/Extender: Melburn Hake, HOYT Weeks in Treatment: 8 History of Present Illness HPI Description: 12/12/18 on evaluation today patient presents as a referral from his endocrinologist regarding issues that he has been having with his left great toe. Subsequently he has been seeing Dr. Cleda Mccreedy and Dr. Cleda Mccreedy has been the breeding the wound and in fact this was debrided this morning. The patient had an appointment there prior to coming here. Subsequently he states that even though he's been going for regular debridement after office things really do not seem to be showing signs of improvement unfortunately. He states that he is having some discomfort but nothing too significant. No fevers, chills, nausea, or vomiting noted at this time. The patient does have a history of hypertension, neuropathy, diabetes mellitus type II, and a history of stroke. Currently he's been using bacitracin on the toe and utilizing a Band-Aid. He  tells me he's had this wound for two years. He cannot remember the last time he had an x-ray. 12/19/18 on evaluation today patient appears to be doing well in regard to his plantar toe ulcer. He's been tolerating the dressing changes without complication. Fortunately there does not appear to be signs of infection at this time which is good news. No fever chills noted. 01/19/19 has been one month since I last saw  the patient as he was out of town. With that being said on evaluation today it does appear that he is going to require sharp debridement and I do believe that the total contact cast would benefit him as far as being applied at this time. He is in agreement that plan. 01/23/19 on evaluation today patient appears to be doing better in regard to his left great toe ulcer. He has been shown signs of improvement week by week and although this is slow it does seem to be doing fairly well which is good news. Overall very pleased in this regard. 01/31/19 on evaluation today patient presents for follow-up concerning his great toe ulcer. He was actually supposed to be here yesterday and he did come however he ended up having to leave due to his blood sugar dropping he states he just did not eat enough lunch. Fortunately seems to be doing much better today which is excellent news. Fortunately there's no evidence of infection at this time which is also good news. No fevers, chills, nausea, or vomiting noted at this time. Overall I feel like he is making excellent progress. 02/06/19 on evaluation today patient actually appears to be doing very well in regard to his plantar toe ulcer. He's been tolerating the dressing changes without complication. Fortunately there is no sign of infection at this time. Overall been very pleased with how things seem to be progressing. No fevers, chills, nausea, or vomiting noted at this time. Electronic Signature(s) Signed: 02/07/2019 5:08:21 PM By: Worthy Keeler  PA-C Entered By: Worthy Keeler on 02/06/2019 14:23:28 [name] ([id number]) -------------------------------------------------------------------------------- Physical Exam Details Patient Name: [name] Date of Service: 02/06/2019 1:15 PM Medical Record Number: [id number] Patient Account Number: [id number] Date of Birth/Sex: 1946/02/27 (73 y.o. M) Treating RN: Cornell Barman Primary Care Provider: Glendon Axe Other Clinician: Referring Provider: Glendon Axe Treating Provider/Extender: STONE III, HOYT Weeks in Treatment: 8 Constitutional Well-nourished and well-hydrated in no acute distress. Respiratory normal breathing without difficulty. Psychiatric this patient is able to make decisions and demonstrates good insight into disease process. Alert and Oriented x 3. pleasant and cooperative. Notes There was some sharp debridement required with today to clear away some of the callous and slough from the surface of the wound. The patient tolerated this without complication post debridement the wound bed appears to be doing significantly better which is excellent news. I'm very pleased in this regard. Electronic Signature(s) Signed: 02/07/2019 5:08:21 PM By: Worthy Keeler PA-C Entered By: Worthy Keeler on 02/06/2019 14:23:59 [place], [name] ([id number]) -------------------------------------------------------------------------------- Physician Orders Details Patient Name: [name] Date of Service: 02/06/2019 1:15 PM Medical Record Number: [id number] Patient Account Number: [id number] Date of Birth/Sex: 11-24-1946 (73 y.o. M) Treating RN: Montey Hora Primary Care Provider: Glendon Axe Other Clinician: Referring Provider: Glendon Axe Treating Provider/Extender: Melburn Hake, HOYT Weeks in Treatment: 8 Verbal / Phone Orders: No Diagnosis Coding ICD-10 Coding Code Description E11.621 Type 2 diabetes mellitus with foot  ulcer L97.522 Non-pressure chronic ulcer of other part of left foot with fat layer exposed I10 Essential (primary) hypertension E11.43 Type 2 diabetes mellitus with diabetic autonomic (poly)neuropathy Wound Cleansing Wound #1 Left Toe Great o May shower with protection. - PLEASE DO NOT GET CAST WET Anesthetic (add to Medication List) Wound #1 Left Toe Great o Topical Lidocaine 4% cream applied to wound bed prior to debridement (In Clinic Only). Primary Wound Dressing Wound #1 Left Toe Great o Silver Collagen Secondary Dressing Wound #  1 Left Toe Great o Foam Dressing Change Frequency Wound #1 Left Toe Great o Change dressing every week Follow-up Appointments Wound #1 Left Toe Great o Return Appointment in 1 week. o Nurse Visit as needed Off-Loading Wound #1 Left Toe Great o Total Contact Cast to Left Lower Extremity Electronic Signature(s) Signed: 02/06/2019 5:02:11 PM By: Montey Hora Signed: 02/07/2019 5:08:21 PM By: Irean Hong Marchi, Victoria Vera ([id number]) Entered By: Montey Hora on 02/06/2019 13:51:24 [name] ([id number]) -------------------------------------------------------------------------------- Problem List Details Patient Name: NAZARETH, KIRK. Date of Service: 02/06/2019 1:15 PM Medical Record Number: [id number] Patient Account Number: [id number] Date of Birth/Sex: 1946/10/14 (73 y.o. M) Treating RN: Cornell Barman Primary Care Provider: Glendon Axe Other Clinician: Referring Provider: Glendon Axe Treating Provider/Extender: Melburn Hake, HOYT Weeks in Treatment: 8 Active Problems ICD-10 Evaluated Encounter Code Description Active Date Today Diagnosis E11.621 Type 2 diabetes mellitus with foot ulcer 12/12/2018 No Yes L97.522 Non-pressure chronic ulcer of other part of left foot with fat 12/12/2018 No Yes layer exposed I10 Essential (primary) hypertension 12/12/2018 No Yes E11.43 Type 2 diabetes  mellitus with diabetic autonomic (poly) 12/12/2018 No Yes neuropathy Inactive Problems Resolved Problems Electronic Signature(s) Signed: 02/07/2019 5:08:21 PM By: Worthy Keeler PA-C Entered By: Worthy Keeler on 02/06/2019 13:13:27 [place], [name] ([id number]) -------------------------------------------------------------------------------- Progress Note Details Patient Name: [name] Date of Service: 02/06/2019 1:15 PM Medical Record Number: [id number] Patient Account Number: [id number] Date of Birth/Sex: 1946-08-02 (73 y.o. M) Treating RN: Cornell Barman Primary Care Provider: Glendon Axe Other Clinician: Referring Provider: Glendon Axe Treating Provider/Extender: Melburn Hake, HOYT Weeks in Treatment: 8 Subjective Chief Complaint Information obtained from Patient Left great toe ulcer History of Present Illness (HPI) 12/12/18 on evaluation today patient presents as a referral from his endocrinologist regarding issues that he has been having with his left great toe. Subsequently he has been seeing Dr. Cleda Mccreedy and Dr. Cleda Mccreedy has been the breeding the wound and in fact this was debrided this morning. The patient had an appointment there prior to coming here. Subsequently he states that even though he's been going for regular debridement after office things really do not seem to be showing signs of improvement unfortunately. He states that he is having some discomfort but nothing too significant. No fevers, chills, nausea, or vomiting noted at this time. The patient does have a history of hypertension, neuropathy, diabetes mellitus type II, and a history of stroke. Currently he's been using bacitracin on the toe and utilizing a Band-Aid. He tells me he's had this wound for two years. He cannot remember the last time he had an x-ray. 12/19/18 on evaluation today patient appears to be doing well in regard to his plantar toe ulcer. He's been tolerating the dressing changes  without complication. Fortunately there does not appear to be signs of infection at this time which is good news. No fever chills noted. 01/19/19 has been one month since I last saw the patient as he was out of town. With that being said on evaluation today it does appear that he is going to require sharp debridement and I do believe that the total contact cast would benefit him as far as being applied at this time. He is in agreement that plan. 01/23/19 on evaluation today patient appears to be doing better in regard to his left great toe ulcer. He has been shown signs of improvement week by week and although this is slow it does seem to be doing fairly well  which is good news. Overall very pleased in this regard. 01/31/19 on evaluation today patient presents for follow-up concerning his great toe ulcer. He was actually supposed to be here yesterday and he did come however he ended up having to leave due to his blood sugar dropping he states he just did not eat enough lunch. Fortunately seems to be doing much better today which is excellent news. Fortunately there's no evidence of infection at this time which is also good news. No fevers, chills, nausea, or vomiting noted at this time. Overall I feel like he is making excellent progress. 02/06/19 on evaluation today patient actually appears to be doing very well in regard to his plantar toe ulcer. He's been tolerating the dressing changes without complication. Fortunately there is no sign of infection at this time. Overall been very pleased with how things seem to be progressing. No fevers, chills, nausea, or vomiting noted at this time. Patient History Information obtained from Patient. Family History Diabetes - Mother, Heart Disease - Father, Stroke - Father, No family history of Cancer, Hereditary Spherocytosis, Hypertension, Kidney Disease, Lung Disease, Seizures, Thyroid Problems, Tuberculosis. Social History COTTRELL, GENTLES  ([id number]) Never smoker, Marital Status - Widowed, Alcohol Use - Never, Drug Use - No History, Caffeine Use - Moderate. Medical History Eyes Denies history of Cataracts, Glaucoma, Optic Neuritis Ear/Nose/Mouth/Throat Denies history of Chronic sinus problems/congestion, Middle ear problems Hematologic/Lymphatic Denies history of Anemia, Hemophilia, Human Immunodeficiency Virus, Lymphedema Respiratory Denies history of Aspiration, Asthma, Chronic Obstructive Pulmonary Disease (COPD), Pneumothorax, Sleep Apnea, Tuberculosis Cardiovascular Patient has history of Hypertension Denies history of Angina, Arrhythmia, Congestive Heart Failure, Coronary Artery Disease, Deep Vein Thrombosis, Hypotension, Myocardial Infarction, Peripheral Arterial Disease, Peripheral Venous Disease, Phlebitis, Vasculitis Gastrointestinal Denies history of Cirrhosis , Colitis, Crohn s, Hepatitis A, Hepatitis B, Hepatitis C Endocrine Patient has history of Type II Diabetes Denies history of Type I Diabetes Genitourinary Denies history of End Stage Renal Disease Immunological Denies history of Lupus Erythematosus, Raynaud s, Scleroderma Integumentary (Skin) Denies history of History of Burn, History of pressure wounds Musculoskeletal Denies history of Gout, Rheumatoid Arthritis, Osteoarthritis, Osteomyelitis Neurologic Denies history of Dementia, Neuropathy, Quadriplegia, Paraplegia, Seizure Disorder Oncologic Denies history of Received Chemotherapy, Received Radiation Review of Systems (ROS) Constitutional Symptoms (General Health) Denies complaints or symptoms of Fever, Chills. Respiratory The patient has no complaints or symptoms. Cardiovascular The patient has no complaints or symptoms. Psychiatric The patient has no complaints or symptoms. Objective Constitutional Well-nourished and well-hydrated in no acute distress. Vitals Time Taken: 12:59 PM, Height: 72 in, Weight: 250 lbs, BMI: 33.9,  Temperature: 98.1 F, Pulse: 67 bpm, Respiratory Rate: 16 breaths/min, Blood Pressure: 139/74 mmHg. JACEON, HEIBERGER ([id number]) Respiratory normal breathing without difficulty. Psychiatric this patient is able to make decisions and demonstrates good insight into disease process. Alert and Oriented x 3. pleasant and cooperative. General Notes: There was some sharp debridement required with today to clear away some of the callous and slough from the surface of the wound. The patient tolerated this without complication post debridement the wound bed appears to be doing significantly better which is excellent news. I'm very pleased in this regard. Integumentary (Hair, Skin) Wound #1 status is Open. Original cause of wound was Gradually Appeared. The wound is located on the Left Toe Great. The wound measures 0.2cm length x 0.2cm width x 0.1cm depth; 0.031cm^2 area and 0.003cm^3 volume. There is Fat Layer (Subcutaneous Tissue) Exposed exposed. There is no tunneling or undermining noted. There is a medium  amount of serous drainage noted. The wound margin is thickened. There is large (67-100%) pink granulation within the wound bed. There is no necrotic tissue within the wound bed. The periwound skin appearance exhibited: Callus, Maceration. The periwound skin appearance did not exhibit: Crepitus, Excoriation, Induration, Rash, Scarring, Dry/Scaly, Atrophie Blanche, Cyanosis, Ecchymosis, Hemosiderin Staining, Mottled, Pallor, Rubor, Erythema. Periwound temperature was noted as No Abnormality. The periwound has tenderness on palpation. Assessment Active Problems ICD-10 Type 2 diabetes mellitus with foot ulcer Non-pressure chronic ulcer of other part of left foot with fat layer exposed Essential (primary) hypertension Type 2 diabetes mellitus with diabetic autonomic (poly)neuropathy Procedures Wound #1 Pre-procedure diagnosis of Wound #1 is a Diabetic Wound/Ulcer of the Lower Extremity  located on the Left Toe Great .Severity of Tissue Pre Debridement is: Fat layer exposed. There was a Excisional Skin/Subcutaneous Tissue Debridement with a total area of 0.04 sq cm performed by STONE III, HOYT E., PA-C. With the following instrument(s): Curette Material removed includes Callus, Subcutaneous Tissue, Slough, and Fibrin/Exudate after achieving pain control using Lidocaine 4% Topical Solution. No specimens were taken. A time out was conducted at 13:46, prior to the start of the procedure. A Minimum amount of bleeding was controlled with Pressure. The procedure was tolerated well with a pain level of 0 throughout and a pain level of 0 following the procedure. Post Debridement Measurements: 0.1cm length x 0.1cm width x 0.1cm depth; 0.001cm^3 volume. Character of Wound/Ulcer Post Debridement is improved. Severity of Tissue Post Debridement is: Fat layer exposed. Post procedure Diagnosis Wound #1: Same as Pre-Procedure Pre-procedure diagnosis of Wound #1 is a Diabetic Wound/Ulcer of the Lower Extremity located on the Left Toe Great . There [place], [place] ([id number]) was a Total Contact Cast Procedure by STONE III, HOYT E., PA-C. Post procedure Diagnosis Wound #1: Same as Pre-Procedure Plan Wound Cleansing: Wound #1 Left Toe Great: May shower with protection. - PLEASE DO NOT GET CAST WET Anesthetic (add to Medication List): Wound #1 Left Toe Great: Topical Lidocaine 4% cream applied to wound bed prior to debridement (In Clinic Only). Primary Wound Dressing: Wound #1 Left Toe Great: Silver Collagen Secondary Dressing: Wound #1 Left Toe Great: Foam Dressing Change Frequency: Wound #1 Left Toe Great: Change dressing every week Follow-up Appointments: Wound #1 Left Toe Great: Return Appointment in 1 week. Nurse Visit as needed Off-Loading: Wound #1 Left Toe Great: Total Contact Cast to Left Lower Extremity At this point there's just a very small area about the size  of the end of a ballpoint pen that still open and remaining. My suggestion currently is gonna be that we go ahead and continue with the cast for another week I'm gonna switch to Prisma as the dressing of choice underneath the cast. He is in agreement the plan. We will subsequently see were things stand in one weeks time I expect he will likely be healed by that point and then following that we will likely want to keep with the cast for one more week to ensure everything completely resolved before discharging him using [place] callous pads until he gets his diabetic shoes. His Artie put the call into the [facility] hospital which is excellent. Subsequently will see were things stand at follow- up. Please see above for specific wound care orders. We will see patient for re-evaluation in 1 week(s) here in the clinic. If anything worsens or changes patient will contact our office for additional recommendations. Electronic Signature(s) Signed: 02/07/2019 5:08:21 PM By: Worthy Keeler PA-C  Entered By: Worthy Keeler on 02/06/2019 14:24:51 [place] ([id number]) -------------------------------------------------------------------------------- ROS/PFSH Details Patient Name: [name] Date of Service: 02/06/2019 1:15 PM Medical Record Number: [id number] Patient Account Number: [id number] Date of Birth/Sex: 1946/10/11 (73 y.o. M) Treating RN: Cornell Barman Primary Care Provider: Glendon Axe Other Clinician: Referring Provider: Glendon Axe Treating Provider/Extender: STONE III, HOYT Weeks in Treatment: 8 Information Obtained From Patient Wound History Do you currently have one or more open woundso No Have you tested positive for osteomyelitis (bone infection)o No Have you had any tests for circulation on your legso No Constitutional Symptoms (General Health) Complaints and Symptoms: Negative for: Fever; Chills Eyes Medical History: Negative for: Cataracts; Glaucoma; Optic  Neuritis Ear/Nose/Mouth/Throat Medical History: Negative for: Chronic sinus problems/congestion; Middle ear problems Hematologic/Lymphatic Medical History: Negative for: Anemia; Hemophilia; Human Immunodeficiency Virus; Lymphedema Respiratory Complaints and Symptoms: No Complaints or Symptoms Medical History: Negative for: Aspiration; Asthma; Chronic Obstructive Pulmonary Disease (COPD); Pneumothorax; Sleep Apnea; Tuberculosis Cardiovascular Complaints and Symptoms: No Complaints or Symptoms Medical History: Positive for: Hypertension Negative for: Angina; Arrhythmia; Congestive Heart Failure; Coronary Artery Disease; Deep Vein Thrombosis; Hypotension; Myocardial Infarction; Peripheral Arterial Disease; Peripheral Venous Disease; Phlebitis; Vasculitis Gastrointestinal DARSHAN, SOLANKI ([id number]) Medical History: Negative for: Cirrhosis ; Colitis; Crohnos; Hepatitis A; Hepatitis B; Hepatitis C Endocrine Medical History: Positive for: Type II Diabetes Negative for: Type I Diabetes Time with diabetes: 1990 Treated with: Insulin Blood sugar tested every day: Yes Tested : Genitourinary Medical History: Negative for: End Stage Renal Disease Immunological Medical History: Negative for: Lupus Erythematosus; Raynaudos; Scleroderma Integumentary (Skin) Medical History: Negative for: History of Burn; History of pressure wounds Musculoskeletal Medical History: Negative for: Gout; Rheumatoid Arthritis; Osteoarthritis; Osteomyelitis Neurologic Medical History: Negative for: Dementia; Neuropathy; Quadriplegia; Paraplegia; Seizure Disorder Oncologic Medical History: Negative for: Received Chemotherapy; Received Radiation Psychiatric Complaints and Symptoms: No Complaints or Symptoms Immunizations Pneumococcal Vaccine: Received Pneumococcal Vaccination: Yes Implantable Devices Family and Social History Cancer: No; Diabetes: Yes - Mother; Heart Disease: Yes - Father;  Hereditary Spherocytosis: No; Hypertension: No; Kidney Disease: No; Lung Disease: No; Seizures: No; Stroke: Yes - Father; Thyroid Problems: No; Tuberculosis: No; Never smoker; Marital Status - Widowed; Alcohol Use: Never; Drug Use: No History; Caffeine Use: Moderate; Financial Concerns: No; Food, Clothing or Shelter Needs: No; Support System Lacking: No; Transportation Concerns: No; Advanced Directives: Yes RISHAB, STOUDT ([id number]) (Not Provided); Patient does not want information on Advanced Directives; Do not resuscitate: Yes (Not Provided); Living Will: Yes (Not Provided); Medical Power of Attorney: Yes (Not Provided) Physician Affirmation I have reviewed and agree with the above information. Electronic Signature(s) Signed: 02/07/2019 11:53:49 AM By: Gretta Cool, BSN, RN, CWS, Kim RN, BSN Signed: 02/07/2019 5:08:21 PM By: Worthy Keeler PA-C Entered By: Worthy Keeler on 02/06/2019 14:23:42 [place], [name] ([id number]) -------------------------------------------------------------------------------- Total Contact Cast Details Patient Name: XAYVION, SHIRAH Date of Service: 02/06/2019 1:15 PM Medical Record Number: [id number] Patient Account Number: [id number] Date of Birth/Sex: 07-07-1946 (73 y.o. M) Treating RN: Montey Hora Primary Care Provider: Glendon Axe Other Clinician: Referring Provider: Glendon Axe Treating Provider/Extender: STONE III, HOYT Weeks in Treatment: 8 Total Contact Cast Applied for Wound Assessment: Wound #1 Left Toe Great Performed By: Physician Emilio Math., PA-C Post Procedure Diagnosis Same as Pre-procedure Electronic Signature(s) Signed: 02/06/2019 5:02:11 PM By: Montey Hora Signed: 02/07/2019 5:08:21 PM By: Worthy Keeler PA-C Entered By: Montey Hora on 02/06/2019 13:56:50 Prazak, [name] ([id number]) -------------------------------------------------------------------------------- SuperBill Details Patient  Name: [name]. Date  of Service: 02/06/2019 Medical Record Number: [id number] Patient Account Number: [id number] Date of Birth/Sex: 27-Nov-1946 (73 y.o. 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

## 2019-02-13 ENCOUNTER — Encounter: Payer: Medicare Other | Admitting: Physician Assistant

## 2019-02-13 DIAGNOSIS — E11621 Type 2 diabetes mellitus with foot ulcer: Secondary | ICD-10-CM | POA: Diagnosis not present

## 2019-02-15 NOTE — Progress Notes (Signed)
Fred, [name] ([id number]) Visit Report for 02/13/2019 Chief Complaint Document Details Patient Name: [name], SOM. Date of Service: 02/13/2019 2:15 PM Medical Record Number: [id number] Patient Account Number: [id number] Date of Birth/Sex: [birth year] (73 y.o. M) Treating RN: Harold Barban Primary Care Provider: Glendon Axe Other Clinician: Referring Provider: Glendon Axe Treating Provider/Extender: STONE III, HOYT Weeks in Treatment: 9 Information Obtained from: Patient Chief Complaint Left great toe ulcer Electronic Signature(s) Signed: 02/14/2019 12:02:18 AM By: Worthy Keeler PA-C Entered By: Worthy Keeler on 02/13/2019 14:09:56 [name], [name] ([id number]) -------------------------------------------------------------------------------- HPI Details Patient Name: [name] Date of Service: 02/13/2019 2:15 PM Medical Record Number: [id number] Patient Account Number: [id number] Date of Birth/Sex: 10-03-1946 (73 y.o. M) Treating RN: Harold Barban Primary Care Provider: Glendon Axe Other Clinician: Referring Provider: Glendon Axe Treating Provider/Extender: Melburn Hake, HOYT Weeks in Treatment: 9 History of Present Illness HPI Description: 12/12/18 on evaluation today patient presents as a referral from his endocrinologist regarding issues that he has been having with his left great toe. Subsequently he has been seeing Dr. Cleda Mccreedy and Dr. Cleda Mccreedy has been the breeding the wound and in fact this was debrided this morning. The patient had an appointment there prior to coming here. Subsequently he states that even though he's been going for regular debridement after office things really do not seem to be showing signs of improvement unfortunately. He states that he is having some discomfort but nothing too significant. No fevers, chills, nausea, or vomiting noted at this time. The patient does have a history of hypertension, neuropathy,  diabetes mellitus type II, and a history of stroke. Currently he's been using bacitracin on the toe and utilizing a Band-Aid. He tells me he's had this wound for two years. He cannot remember the last time he had an x-ray. 12/19/18 on evaluation today patient appears to be doing well in regard to his plantar toe ulcer. He's been tolerating the dressing changes without complication. Fortunately there does not appear to be signs of infection at this time which is good news. No fever chills noted. 01/19/19 has been one month since I last saw the patient as he was out of town. With that being said on evaluation today it does appear that he is going to require sharp debridement and I do believe that the total contact cast would benefit him as far as being applied at this time. He is in agreement that plan. 01/23/19 on evaluation today patient appears to be doing better in regard to his left great toe ulcer. He has been shown signs of improvement week by week and although this is slow it does seem to be doing fairly well which is good news. Overall very pleased in this regard. 01/31/19 on evaluation today patient presents for follow-up concerning his great toe ulcer. He was actually supposed to be here yesterday and he did come however he ended up having to leave due to his blood sugar dropping he states he just did not eat enough lunch. Fortunately seems to be doing much better today which is excellent news. Fortunately there's no evidence of infection at this time which is also good news. No fevers, chills, nausea, or vomiting noted at this time. Overall I feel like he is making excellent progress. 02/06/19 on evaluation today patient actually appears to be doing very well in regard to his plantar toe ulcer. He's been tolerating the dressing changes without complication. Fortunately there is no sign of infection at this time. Overall been  very pleased with how things seem to be progressing. No fevers,  chills, nausea, or vomiting noted at this time. 02/13/19 on evaluation today patient actually appears to be doing excellent in regard to his toe ulcer which is almost completely close. Overall very pleased with that. There does not appear to be any signs of infection which is good news. Upon close inspection it appears that he has just a very slight opening still noted at the central portion of the toe although this is minimal. Electronic Signature(s) Signed: 02/14/2019 12:02:18 AM By: Worthy Keeler PA-C Entered By: Worthy Keeler on 02/13/2019 17:43:52 [name], [name] ([id number]) -------------------------------------------------------------------------------- Physical Exam Details Patient Name: [name]. Date of Service: 02/13/2019 2:15 PM Medical Record Number: [id number] Patient Account Number: [id number] Date of Birth/Sex: Sep 01, 1946 (73 y.o. M) Treating RN: Harold Barban Primary Care Provider: Glendon Axe Other Clinician: Referring Provider: Glendon Axe Treating Provider/Extender: STONE III, HOYT Weeks in Treatment: 9 Constitutional Well-nourished and well-hydrated in no acute distress. Respiratory normal breathing without difficulty. Psychiatric this patient is able to make decisions and demonstrates good insight into disease process. Alert and Oriented x 3. pleasant and cooperative. Notes Patient's wound bed today did not require any sharp debridement he has just a very small opening noted in the base of the toe. I'm very pleased in this regard. With that being said I do think the cast is something I will continue we almost have this completely healed. The patient is in agreement with this plan. Electronic Signature(s) Signed: 02/14/2019 12:02:18 AM By: Worthy Keeler PA-C Entered By: Worthy Keeler on 02/13/2019 17:44:45 [name], [name]  ([id number]) -------------------------------------------------------------------------------- Physician Orders Details Patient Name: [name] Date of Service: 02/13/2019 2:15 PM Medical Record Number: [id number] Patient Account Number: [id number] Date of Birth/Sex: 01/23/1946 (73 y.o. M) Treating RN: Harold Barban Primary Care Provider: Glendon Axe Other Clinician: Referring Provider: Glendon Axe Treating Provider/Extender: Melburn Hake, HOYT Weeks in Treatment: 9 Verbal / Phone Orders: No Diagnosis Coding ICD-10 Coding Code Description E11.621 Type 2 diabetes mellitus with foot ulcer L97.522 Non-pressure chronic ulcer of other part of left foot with fat layer exposed I10 Essential (primary) hypertension E11.43 Type 2 diabetes mellitus with diabetic autonomic (poly)neuropathy Wound Cleansing Wound #1 Left Toe Great o May shower with protection. - PLEASE DO NOT GET CAST WET Anesthetic (add to Medication List) Wound #1 Left Toe Great o Topical Lidocaine 4% cream applied to wound bed prior to debridement (In Clinic Only). Primary Wound Dressing Wound #1 Left Toe Great o Silver Collagen Secondary Dressing Wound #1 Left Toe Great o Foam Dressing Change Frequency Wound #1 Left Toe Great o Change dressing every week Follow-up Appointments Wound #1 Left Toe Great o Return Appointment in 1 week. o Nurse Visit as needed Off-Loading Wound #1 Left Toe Great o Total Contact Cast to Left Lower Extremity Electronic Signature(s) Signed: 02/14/2019 12:02:18 AM By: Worthy Keeler PA-C Signed: 02/15/2019 8:37:22 AM By: Verdell Carmine ([id number]) Entered By: Harold Barban on 02/13/2019 16:05:45 [name] ([id number]) -------------------------------------------------------------------------------- Problem List Details Patient Name: [name], [name]. Date of Service: 02/13/2019 2:15 PM Medical Record Number:  [id number] Patient Account Number: [id number] Date of Birth/Sex: 10-10-1946 (73 y.o. M) Treating RN: Harold Barban Primary Care Provider: Glendon Axe Other Clinician: Referring Provider: Glendon Axe Treating Provider/Extender: Melburn Hake, HOYT Weeks in Treatment: 9 Active Problems ICD-10 Evaluated Encounter Code Description Active Date Today Diagnosis E11.621 Type 2 diabetes mellitus with foot ulcer 12/12/2018  No Yes L97.522 Non-pressure chronic ulcer of other part of left foot with fat 12/12/2018 No Yes layer exposed I10 Essential (primary) hypertension 12/12/2018 No Yes E11.43 Type 2 diabetes mellitus with diabetic autonomic (poly) 12/12/2018 No Yes neuropathy Inactive Problems Resolved Problems Electronic Signature(s) Signed: 02/14/2019 12:02:18 AM By: Worthy Keeler PA-C Entered By: Worthy Keeler on 02/13/2019 14:09:35 [name], [name] ([id number]) -------------------------------------------------------------------------------- Progress Note Details Patient Name: [name]. Date of Service: 02/13/2019 2:15 PM Medical Record Number: [id number] Patient Account Number: [id number] Date of Birth/Sex: Mar 15, 1946 (73 y.o. M) Treating RN: Harold Barban Primary Care Provider: Glendon Axe Other Clinician: Referring Provider: Glendon Axe Treating Provider/Extender: Melburn Hake, HOYT Weeks in Treatment: 9 Subjective Chief Complaint Information obtained from Patient Left great toe ulcer History of Present Illness (HPI) 12/12/18 on evaluation today patient presents as a referral from his endocrinologist regarding issues that he has been having with his left great toe. Subsequently he has been seeing Dr. Cleda Mccreedy and Dr. Cleda Mccreedy has been the breeding the wound and in fact this was debrided this morning. The patient had an appointment there prior to coming here. Subsequently he states that even though he's been going for regular debridement after office things  really do not seem to be showing signs of improvement unfortunately. He states that he is having some discomfort but nothing too significant. No fevers, chills, nausea, or vomiting noted at this time. The patient does have a history of hypertension, neuropathy, diabetes mellitus type II, and a history of stroke. Currently he's been using bacitracin on the toe and utilizing a Band-Aid. He tells me he's had this wound for two years. He cannot remember the last time he had an x-ray. 12/19/18 on evaluation today patient appears to be doing well in regard to his plantar toe ulcer. He's been tolerating the dressing changes without complication. Fortunately there does not appear to be signs of infection at this time which is good news. No fever chills noted. 01/19/19 has been one month since I last saw the patient as he was out of town. With that being said on evaluation today it does appear that he is going to require sharp debridement and I do believe that the total contact cast would benefit him as far as being applied at this time. He is in agreement that plan. 01/23/19 on evaluation today patient appears to be doing better in regard to his left great toe ulcer. He has been shown signs of improvement week by week and although this is slow it does seem to be doing fairly well which is good news. Overall very pleased in this regard. 01/31/19 on evaluation today patient presents for follow-up concerning his great toe ulcer. He was actually supposed to be here yesterday and he did come however he ended up having to leave due to his blood sugar dropping he states he just did not eat enough lunch. Fortunately seems to be doing much better today which is excellent news. Fortunately there's no evidence of infection at this time which is also good news. No fevers, chills, nausea, or vomiting noted at this time. Overall I feel like he is making excellent progress. 02/06/19 on evaluation today patient actually  appears to be doing very well in regard to his plantar toe ulcer. He's been tolerating the dressing changes without complication. Fortunately there is no sign of infection at this time. Overall been very pleased with how things seem to be progressing. No fevers, chills, nausea, or vomiting noted at  this time. 02/13/19 on evaluation today patient actually appears to be doing excellent in regard to his toe ulcer which is almost completely close. Overall very pleased with that. There does not appear to be any signs of infection which is good news. Upon close inspection it appears that he has just a very slight opening still noted at the central portion of the toe although this is minimal. Patient History Information obtained from Patient. Family History [name], [name] ([id number]) Diabetes - Mother, Heart Disease - Father, Stroke - Father, No family history of Cancer, Hereditary Spherocytosis, Hypertension, Kidney Disease, Lung Disease, Seizures, Thyroid Problems, Tuberculosis. Social History Never smoker, Marital Status - Widowed, Alcohol Use - Never, Drug Use - No History, Caffeine Use - Moderate. Medical History Eyes Denies history of Cataracts, Glaucoma, Optic Neuritis Ear/Nose/Mouth/Throat Denies history of Chronic sinus problems/congestion, Middle ear problems Hematologic/Lymphatic Denies history of Anemia, Hemophilia, Human Immunodeficiency Virus, Lymphedema Respiratory Denies history of Aspiration, Asthma, Chronic Obstructive Pulmonary Disease (COPD), Pneumothorax, Sleep Apnea, Tuberculosis Cardiovascular Patient has history of Hypertension Denies history of Angina, Arrhythmia, Congestive Heart Failure, Coronary Artery Disease, Deep Vein Thrombosis, Hypotension, Myocardial Infarction, Peripheral Arterial Disease, Peripheral Venous Disease, Phlebitis, Vasculitis Gastrointestinal Denies history of Cirrhosis , Colitis, Crohn s, Hepatitis A, Hepatitis B, Hepatitis  C Endocrine Patient has history of Type II Diabetes Denies history of Type I Diabetes Genitourinary Denies history of End Stage Renal Disease Immunological Denies history of Lupus Erythematosus, Raynaud s, Scleroderma Integumentary (Skin) Denies history of History of Burn, History of pressure wounds Musculoskeletal Denies history of Gout, Rheumatoid Arthritis, Osteoarthritis, Osteomyelitis Neurologic Denies history of Dementia, Neuropathy, Quadriplegia, Paraplegia, Seizure Disorder Oncologic Denies history of Received Chemotherapy, Received Radiation Review of Systems (ROS) Constitutional Symptoms (General Health) Denies complaints or symptoms of Fever, Chills. Respiratory The patient has no complaints or symptoms. Cardiovascular The patient has no complaints or symptoms. Psychiatric The patient has no complaints or symptoms. Objective Constitutional [name], [name] ([id number]) Well-nourished and well-hydrated in no acute distress. Vitals Time Taken: 2:30 PM, Height: 72 in, Weight: 250 lbs, BMI: 33.9, Temperature: 98.3 F, Pulse: 74 bpm, Respiratory Rate: 16 breaths/min, Blood Pressure: 121/58 mmHg. Respiratory normal breathing without difficulty. Psychiatric this patient is able to make decisions and demonstrates good insight into disease process. Alert and Oriented x 3. pleasant and cooperative. General Notes: Patient's wound bed today did not require any sharp debridement he has just a very small opening noted in the base of the toe. I'm very pleased in this regard. With that being said I do think the cast is something I will continue we almost have this completely healed. The patient is in agreement with this plan. Integumentary (Hair, Skin) Wound #1 status is Open. Original cause of wound was Gradually Appeared. The wound is located on the Left Toe Great. The wound measures 0.1cm length x 0.1cm width x 0.1cm depth; 0.008cm^2 area and 0.001cm^3 volume. There is  Fat Layer (Subcutaneous Tissue) Exposed exposed. There is no tunneling or undermining noted. There is a none present amount of drainage noted. The wound margin is thickened. There is no granulation within the wound bed. There is no necrotic tissue within the wound bed. The periwound skin appearance exhibited: Callus, Maceration. The periwound skin appearance did not exhibit: Crepitus, Excoriation, Induration, Rash, Scarring, Dry/Scaly, Atrophie Blanche, Cyanosis, Ecchymosis, Hemosiderin Staining, Mottled, Pallor, Rubor, Erythema. Periwound temperature was noted as No Abnormality. The periwound has tenderness on palpation. Assessment Active Problems ICD-10 Type 2 diabetes mellitus with foot ulcer Non-pressure chronic  ulcer of other part of left foot with fat layer exposed Essential (primary) hypertension Type 2 diabetes mellitus with diabetic autonomic (poly)neuropathy Procedures Wound #1 Pre-procedure diagnosis of Wound #1 is a Diabetic Wound/Ulcer of the Lower Extremity located on the Left Toe Great . There was a Total Contact Cast Procedure by STONE III, HOYT E., PA-C. Post procedure Diagnosis Wound #1: Same as Pre-Procedure [name], [name] ([id number]) Plan Wound Cleansing: Wound #1 Left Toe Great: May shower with protection. - PLEASE DO NOT GET CAST WET Anesthetic (add to Medication List): Wound #1 Left Toe Great: Topical Lidocaine 4% cream applied to wound bed prior to debridement (In Clinic Only). Primary Wound Dressing: Wound #1 Left Toe Great: Silver Collagen Secondary Dressing: Wound #1 Left Toe Great: Foam Dressing Change Frequency: Wound #1 Left Toe Great: Change dressing every week Follow-up Appointments: Wound #1 Left Toe Great: Return Appointment in 1 week. Nurse Visit as needed Off-Loading: Wound #1 Left Toe Great: Total Contact Cast to Left Lower Extremity I did suggest that we go ahead and continue with the above wound care measures for the next  week. The patient is in agreement with plan. We will subsequently see were things stand at follow-up. If anything changes worsens meantime he will contact the office and let me know. Please see above for specific wound care orders. We will see patient for re-evaluation in 1 week(s) here in the clinic. If anything worsens or changes patient will contact our office for additional recommendations. I did apply the total contact cast myself. Electronic Signature(s) Signed: 02/14/2019 12:02:18 AM By: Worthy Keeler PA-C Entered By: Worthy Keeler on 02/13/2019 17:53:43 [name], [name] ([id number]) -------------------------------------------------------------------------------- ROS/PFSH Details Patient Name: [name] Date of Service: 02/13/2019 2:15 PM Medical Record Number: [id number] Patient Account Number: [id number] Date of Birth/Sex: 02-18-1946 (73 y.o. M) Treating RN: Harold Barban Primary Care Provider: Glendon Axe Other Clinician: Referring Provider: Glendon Axe Treating Provider/Extender: STONE III, HOYT Weeks in Treatment: 9 Information Obtained From Patient Wound History Do you currently have one or more open woundso No Have you tested positive for osteomyelitis (bone infection)o No Have you had any tests for circulation on your legso No Constitutional Symptoms (General Health) Complaints and Symptoms: Negative for: Fever; Chills Eyes Medical History: Negative for: Cataracts; Glaucoma; Optic Neuritis Ear/Nose/Mouth/Throat Medical History: Negative for: Chronic sinus problems/congestion; Middle ear problems Hematologic/Lymphatic Medical History: Negative for: Anemia; Hemophilia; Human Immunodeficiency Virus; Lymphedema Respiratory Complaints and Symptoms: No Complaints or Symptoms Medical History: Negative for: Aspiration; Asthma; Chronic Obstructive Pulmonary Disease (COPD); Pneumothorax; Sleep Apnea; Tuberculosis Cardiovascular Complaints  and Symptoms: No Complaints or Symptoms Medical History: Positive for: Hypertension Negative for: Angina; Arrhythmia; Congestive Heart Failure; Coronary Artery Disease; Deep Vein Thrombosis; Hypotension; Myocardial Infarction; Peripheral Arterial Disease; Peripheral Venous Disease; Phlebitis; Vasculitis Gastrointestinal [name], [name] ([id number]) Medical History: Negative for: Cirrhosis ; Colitis; Crohnos; Hepatitis A; Hepatitis B; Hepatitis C Endocrine Medical History: Positive for: Type II Diabetes Negative for: Type I Diabetes Time with diabetes: 1990 Treated with: Insulin Blood sugar tested every day: Yes Tested : Genitourinary Medical History: Negative for: End Stage Renal Disease Immunological Medical History: Negative for: Lupus Erythematosus; Raynaudos; Scleroderma Integumentary (Skin) Medical History: Negative for: History of Burn; History of pressure wounds Musculoskeletal Medical History: Negative for: Gout; Rheumatoid Arthritis; Osteoarthritis; Osteomyelitis Neurologic Medical History: Negative for: Dementia; Neuropathy; Quadriplegia; Paraplegia; Seizure Disorder Oncologic Medical History: Negative for: Received Chemotherapy; Received Radiation Psychiatric Complaints and Symptoms: No Complaints or Symptoms Immunizations Pneumococcal Vaccine: Received Pneumococcal  Vaccination: Yes Implantable Devices Family and Social History Cancer: No; Diabetes: Yes - Mother; Heart Disease: Yes - Father; Hereditary Spherocytosis: No; Hypertension: No; Kidney Disease: No; Lung Disease: No; Seizures: No; Stroke: Yes - Father; Thyroid Problems: No; Tuberculosis: No; Never smoker; Marital Status - Widowed; Alcohol Use: Never; Drug Use: No History; Caffeine Use: Moderate; Financial Concerns: No; Food, Clothing or Shelter Needs: No; Support System Lacking: No; Transportation Concerns: No; Advanced Directives: Yes [name], [name] ([id number]) (Not Provided);  Patient does not want information on Advanced Directives; Do not resuscitate: Yes (Not Provided); Living Will: Yes (Not Provided); Medical Power of Attorney: Yes (Not Provided) Physician Affirmation I have reviewed and agree with the above information. Electronic Signature(s) Signed: 02/14/2019 12:02:18 AM By: Worthy Keeler PA-C Signed: 02/15/2019 8:37:22 AM By: Harold Barban Entered By: Worthy Keeler on 02/13/2019 17:44:25 [name], [name] ([id number]) -------------------------------------------------------------------------------- Total Contact Cast Details Patient Name: [name], WESCHE. Date of Service: 02/13/2019 2:15 PM Medical Record Number: [id number] Patient Account Number: [id number] Date of Birth/Sex: [birth year] (73 y.o. M) Treating RN: Harold Barban Primary Care Provider: Glendon Axe Other Clinician: Referring Provider: Glendon Axe Treating Provider/Extender: STONE III, HOYT Weeks in Treatment: 9 Total Contact Cast Applied for Wound Assessment: Wound #1 Left Toe Great Performed By: Physician Emilio Math., PA-C Post Procedure Diagnosis Same as Pre-procedure Electronic Signature(s) Signed: 02/14/2019 12:02:18 AM By: Worthy Keeler PA-C Entered By: Worthy Keeler on 02/13/2019 17:45:43 [name] ([id number]) -------------------------------------------------------------------------------- SuperBill Details Patient Name: [name] Date of Service: 02/13/2019 Medical Record Number: [id number] Patient Account Number: [id number] Date of Birth/Sex: 06-Dec-1946 (73 y.o. M) Treating RN: Harold Barban Primary Care Provider: Glendon Axe Other Clinician: Referring Provider: Glendon Axe Treating Provider/Extender: Melburn Hake, HOYT Weeks in Treatment: 9 Diagnosis Coding ICD-10 Codes Code Description E11.621 Type 2 diabetes mellitus with foot ulcer L97.522 Non-pressure chronic ulcer of other part of left foot with fat  layer exposed I10 Essential (primary) hypertension E11.43 Type 2 diabetes mellitus with diabetic autonomic (poly)neuropathy Facility Procedures CPT4 Code: [id number] Description: [phone] - WOUND CARE VISIT-LEV 2 EST PT Modifier: Quantity: 1 CPT4 Code: [id number] Description: 02637 - APPLY TOTAL CONTACT LEG CAST ICD-10 Diagnosis Description L97.522 Non-pressure chronic ulcer of other part of left foot with fat Modifier: layer exposed Quantity: 1 Physician Procedures CPT4 Code: [id number] Description: 77412 - WC PHYS APPLY TOTAL CONTACT CAST ICD-10 Diagnosis Description L97.522 Non-pressure chronic ulcer of other part of left foot with fat Modifier: layer exposed Quantity: 1 Electronic Signature(s) Signed: 02/14/2019 12:02:18 AM By: Worthy Keeler PA-C Entered By: Worthy Keeler on 02/13/2019 18:00:24

## 2019-02-15 NOTE — Progress Notes (Signed)
WELLES, WALTHALL ([id number]) Visit Report for 02/13/2019 Arrival Information Details Patient Name: [name]. Date of Service: 02/13/2019 2:15 PM Medical Record Number: [id number] Patient Account Number: [id number] Date of Birth/Sex: 04-25-1946 (73 y.o. M) Treating RN: Montey Hora Primary Care Sherwood Castilla: Glendon Axe Other Clinician: Referring Raegan Sipp: Glendon Axe Treating Dontavia Brand/Extender: STONE III, HOYT Weeks in Treatment: 9 Visit Information History Since Last Visit Added or deleted any medications: No Patient Arrived: Ambulatory Any new allergies or adverse reactions: No Arrival Time: 14:29 Had a fall or experienced change in No Accompanied By: self activities of daily living that may affect Transfer Assistance: None risk of falls: Patient Identification Verified: Yes Signs or symptoms of abuse/neglect since last visito No Secondary Verification Process Completed: Yes Hospitalized since last visit: No Patient Has Alerts: Yes Implantable device outside of the clinic excluding No Patient Alerts: DMII cellular tissue based products placed in the center since last visit: Has Dressing in Place as Prescribed: Yes Pain Present Now: No Electronic Signature(s) Signed: 02/14/2019 4:18:49 PM By: Montey Hora Entered By: Montey Hora on 02/13/2019 14:29:25 [name] ([id number]) -------------------------------------------------------------------------------- Clinic Level of Care Assessment Details Patient Name: [name] Date of Service: 02/13/2019 2:15 PM Medical Record Number: [id number] Patient Account Number: [id number] Date of Birth/Sex: March 23, 1946 (73 y.o. M) Treating RN: Harold Barban Primary Care Jesse Nosbisch: Glendon Axe Other Clinician: Referring Emilia Kayes: Glendon Axe Treating Amita Atayde/Extender: STONE III, HOYT Weeks in Treatment: 9 Clinic Level of Care Assessment Items TOOL 4 Quantity Score []  - Use when  only an EandM is performed on FOLLOW-UP visit 0 ASSESSMENTS - Nursing Assessment / Reassessment X - Reassessment of Co-morbidities (includes updates in patient status) 1 10 X- 1 5 Reassessment of Adherence to Treatment Plan ASSESSMENTS - Wound and Skin Assessment / Reassessment X - Simple Wound Assessment / Reassessment - one wound 1 5 []  - 0 Complex Wound Assessment / Reassessment - multiple wounds []  - 0 Dermatologic / Skin Assessment (not related to wound area) ASSESSMENTS - Focused Assessment []  - Circumferential Edema Measurements - multi extremities 0 []  - 0 Nutritional Assessment / Counseling / Intervention []  - 0 Lower Extremity Assessment (monofilament, tuning fork, pulses) []  - 0 Peripheral Arterial Disease Assessment (using hand held doppler) ASSESSMENTS - Ostomy and/or Continence Assessment and Care []  - Incontinence Assessment and Management 0 []  - 0 Ostomy Care Assessment and Management (repouching, etc.) PROCESS - Coordination of Care X - Simple Patient / Family Education for ongoing care 1 15 []  - 0 Complex (extensive) Patient / Family Education for ongoing care []  - 0 Staff obtains Programmer, systems, Records, Test Results / Process Orders []  - 0 Staff telephones HHA, Nursing Homes / Clarify orders / etc []  - 0 Routine Transfer to another Facility (non-emergent condition) []  - 0 Routine Hospital Admission (non-emergent condition) []  - 0 New Admissions / Biomedical engineer / Ordering NPWT, Apligraf, etc. []  - 0 Emergency Hospital Admission (emergent condition) X- 1 10 Simple Discharge Coordination [name] ([id number]) []  - 0 Complex (extensive) Discharge Coordination PROCESS - Special Needs []  - Pediatric / Minor Patient Management 0 []  - 0 Isolation Patient Management []  - 0 Hearing / Language / Visual special needs []  - 0 Assessment of Community assistance (transportation, D/C planning, etc.) []  - 0 Additional assistance / Altered  mentation []  - 0 Support Surface(s) Assessment (bed, cushion, seat, etc.) INTERVENTIONS - Wound Cleansing / Measurement X - Simple Wound Cleansing - one wound 1 5 []  - 0 Complex Wound Cleansing -  multiple wounds X- 1 5 Wound Imaging (photographs - any number of wounds) []  - 0 Wound Tracing (instead of photographs) X- 1 5 Simple Wound Measurement - one wound []  - 0 Complex Wound Measurement - multiple wounds INTERVENTIONS - Wound Dressings X - Small Wound Dressing one or multiple wounds 1 10 []  - 0 Medium Wound Dressing one or multiple wounds []  - 0 Large Wound Dressing one or multiple wounds []  - 0 Application of Medications - topical []  - 0 Application of Medications - injection INTERVENTIONS - Miscellaneous []  - External ear exam 0 []  - 0 Specimen Collection (cultures, biopsies, blood, body fluids, etc.) []  - 0 Specimen(s) / Culture(s) sent or taken to Lab for analysis []  - 0 Patient Transfer (multiple staff / Civil Service fast streamer / Similar devices) []  - 0 Simple Staple / Suture removal (25 or less) []  - 0 Complex Staple / Suture removal (26 or more) []  - 0 Hypo / Hyperglycemic Management (close monitor of Blood Glucose) []  - 0 Ankle / Brachial Index (ABI) - do not check if billed separately X- 1 5 Vital Signs [name] ([id number]) Has the patient been seen at the hospital within the last three years: Yes Total Score: 75 Level Of Care: New/Established - Level 2 Electronic Signature(s) Signed: 02/15/2019 8:37:22 AM By: Harold Barban Entered By: Harold Barban on 02/13/2019 16:06:44 [name] ([id number]) -------------------------------------------------------------------------------- Lower Extremity Assessment Details Patient Name: [name]. Date of Service: 02/13/2019 2:15 PM Medical Record Number: [id number] Patient Account Number: [id number] Date of Birth/Sex: March 05, 1946 (73 y.o. M) Treating RN: Montey Hora Primary Care  Khylie Larmore: Glendon Axe Other Clinician: Referring Margarie Mcguirt: Glendon Axe Treating Jhan Conery/Extender: STONE III, HOYT Weeks in Treatment: 9 Vascular Assessment Pulses: Dorsalis Pedis Palpable: [Left:Yes] Posterior Tibial Extremity colors, hair growth, and conditions: Extremity Color: [Left:Normal] Hair Growth on Extremity: [Left:No] Temperature of Extremity: [Left:Cool] Capillary Refill: [Left:< 3 seconds] Toe Nail Assessment Left: Right: Thick: Yes Discolored: Yes Deformed: No Improper Length and Hygiene: Yes Electronic Signature(s) Signed: 02/14/2019 4:18:49 PM By: Montey Hora Entered By: Montey Hora on 02/13/2019 14:46:57 [place], [name] ([id number]) -------------------------------------------------------------------------------- Multi Wound Chart Details Patient Name: [name]. Date of Service: 02/13/2019 2:15 PM Medical Record Number: [id number] Patient Account Number: [id number] Date of Birth/Sex: 1946/12/21 (73 y.o. M) Treating RN: Harold Barban Primary Care Greysyn Vanderberg: Glendon Axe Other Clinician: Referring Ajia Chadderdon: Glendon Axe Treating Kathrine Rieves/Extender: STONE III, HOYT Weeks in Treatment: 9 Vital Signs Height(in): 72 Pulse(bpm): 74 Weight(lbs): 250 Blood Pressure(mmHg): 121/58 Body Mass Index(BMI): 34 Temperature(F): 98.3 Respiratory Rate 16 (breaths/min): Photos: [1:No Photos] [N/A:N/A] Wound Location: [1:Left Toe Great] [N/A:N/A] Wounding Event: [1:Gradually Appeared] [N/A:N/A] Primary Etiology: [1:Diabetic Wound/Ulcer of the N/A Lower Extremity] Comorbid History: [1:Hypertension, Type II Diabetes N/A] Date Acquired: [1:11/30/2017] [N/A:N/A] Weeks of Treatment: [1:9] [N/A:N/A] Wound Status: [1:Open] [N/A:N/A] Measurements L x W x D [1:0.1x0.1x0.1] [N/A:N/A] (cm) Area (cm) : [1:0.008] [N/A:N/A] Volume (cm) : [1:0.001] [N/A:N/A] % Reduction in Area: [1:98.70%] [N/A:N/A] % Reduction in Volume: [1:98.40%]  [N/A:N/A] Classification: [1:Grade 2] [N/A:N/A] Exudate Amount: [1:None Present] [N/A:N/A] Wound Margin: [1:Thickened] [N/A:N/A] Granulation Amount: [1:None Present (0%)] [N/A:N/A] Necrotic Amount: [1:None Present (0%)] [N/A:N/A] Exposed Structures: [1:Fat Layer (Subcutaneous Tissue) Exposed: Yes Fascia: No Tendon: No Muscle: No Joint: No Bone: No] [N/A:N/A] Epithelialization: [1:Large (67-100%)] [N/A:N/A] Periwound Skin Texture: [1:Callus: Yes Excoriation: No Induration: No Crepitus: No Rash: No Scarring: No] [N/A:N/A] Periwound Skin Moisture: [1:Maceration: Yes Dry/Scaly: No] [N/A:N/A] Periwound Skin Color: [N/A:N/A] Atrophie Blanche: No Cyanosis: No Ecchymosis: No Erythema: No Hemosiderin  Staining: No Mottled: No Pallor: No Rubor: No Temperature: No Abnormality N/A N/A Tenderness on Palpation: Yes N/A N/A Wound Preparation: Ulcer Cleansing: N/A N/A Rinsed/Irrigated with Saline, Other: soap and water Topical Anesthetic Applied: Other: lidocaine 4% Treatment Notes Electronic Signature(s) Signed: 02/15/2019 8:37:22 AM By: Harold Barban Entered By: Harold Barban on 02/13/2019 16:03:11 [place], [name] ([id number]) -------------------------------------------------------------------------------- [place] Details Patient Name: [name] Date of Service: 02/13/2019 2:15 PM Medical Record Number: [id number] Patient Account Number: [id number] Date of Birth/Sex: [birth year] (73 y.o. M) Treating RN: Harold Barban Primary Care Chanice Brenton: Glendon Axe Other Clinician: Referring Raphael Espe: Glendon Axe Treating Dayana Dalporto/Extender: STONE III, HOYT Weeks in Treatment: 9 Active Inactive Abuse / Safety / Falls / Self Care Management Nursing Diagnoses: Potential for falls Goals: Patient will remain injury free related to falls Date Initiated: 01/19/2019 Target Resolution Date: 04/01/2019 Goal Status: Active Interventions: Assess fall risk  on admission and as needed Notes: Orientation to the Wound Care Program Nursing Diagnoses: Knowledge deficit related to the wound healing center program Goals: Patient/caregiver will verbalize understanding of the [place] Program Date Initiated: 01/19/2019 Target Resolution Date: 04/01/2019 Goal Status: Active Interventions: Provide education on orientation to the wound center Notes: Wound/Skin Impairment Nursing Diagnoses: Impaired tissue integrity Goals: Ulcer/skin breakdown will have a volume reduction of 30% by week 4 Date Initiated: 12/12/2018 Target Resolution Date: 01/12/2019 Goal Status: Active Interventions: Assess patient/caregiver ability to obtain necessary supplies LETROY, VAZGUEZ ([id number]) Assess patient/caregiver ability to perform ulcer/skin care regimen upon admission and as needed Notes: Electronic Signature(s) Signed: 02/15/2019 8:37:22 AM By: Harold Barban Entered By: Harold Barban on 02/13/2019 [place], [place] ([id number]) -------------------------------------------------------------------------------- Pain Assessment Details Patient Name: [name]. Date of Service: 02/13/2019 2:15 PM Medical Record Number: [id number] Patient Account Number: [id number] Date of Birth/Sex: July 26, 1946 (73 y.o. M) Treating RN: Montey Hora Primary Care Falicia Lizotte: Glendon Axe Other Clinician: Referring Lynae Pederson: Glendon Axe Treating Caelie [place]/Extender: STONE III, HOYT Weeks in Treatment: 9 Active Problems Location of Pain Severity and Description of Pain Patient Has Paino No Site Locations Pain Management and Medication Current Pain Management: Electronic Signature(s) Signed: 02/14/2019 4:18:49 PM By: Montey Hora Entered By: Montey Hora on 02/13/2019 14:30:32 [name] ([id number]) -------------------------------------------------------------------------------- Patient/Caregiver Education  Details Patient Name: [name]. Date of Service: 02/13/2019 2:15 PM Medical Record Number: [id number] Patient Account Number: [id number] Date of Birth/Gender: [birth year] (73 y.o. M) Treating RN: Harold Barban Primary Care Physician: Glendon Axe Other Clinician: Referring Physician: Glendon Axe Treating Physician/Extender: Sharalyn Ink in Treatment: 9 Education Assessment Education Provided To: Patient Education Topics Provided Wound/Skin Impairment: Handouts: Caring for Your Ulcer Methods: Demonstration, Explain/Verbal Responses: State content correctly Electronic Signature(s) Signed: 02/15/2019 8:37:22 AM By: Harold Barban Entered By: Harold Barban on 02/13/2019 16:03:41 [place], [name] ([id number]) -------------------------------------------------------------------------------- Wound Assessment Details Patient Name: [name]. Date of Service: 02/13/2019 2:15 PM Medical Record Number: [id number] Patient Account Number: [id number] Date of Birth/Sex: June 15, 1946 (73 y.o. M) Treating RN: Montey Hora Primary Care Tymel Conely: Glendon Axe Other Clinician: Referring Kaiyla Stahly: Glendon Axe Treating Fawnda Vitullo/Extender: STONE III, HOYT Weeks in Treatment: 9 Wound Status Wound Number: 1 Primary Etiology: Diabetic Wound/Ulcer of the Lower Extremity Wound Location: Left Toe Great Wound Status: Open Wounding Event: Gradually Appeared Comorbid Hypertension, Type II Diabetes Date Acquired: 11/30/2017 History: Weeks Of Treatment: 9 Clustered Wound: No Photos Photo Uploaded By: Montey Hora on 02/14/2019 10:58:11 Wound Measurements Length: (cm) 0.1 Width: (cm) 0.1 Depth: (cm) 0.1 Area: (cm) 0.008 Volume: (cm)  0.001 % Reduction in Area: 98.7% % Reduction in Volume: 98.4% Epithelialization: Large (67-100%) Tunneling: No Undermining: No Wound Description Classification: Grade 2 Foul O Wound Margin: Thickened  Slough Exudate Amount: None Present dor After Cleansing: No /Fibrino Yes Wound Bed Granulation Amount: None Present (0%) Exposed Structure Necrotic Amount: None Present (0%) Fascia Exposed: No Fat Layer (Subcutaneous Tissue) Exposed: Yes Tendon Exposed: No Muscle Exposed: No Joint Exposed: No Bone Exposed: No Periwound Skin Texture Texture Color No Abnormalities Noted: No No Abnormalities Noted: No VEDH, PTACEK ([id number]) Callus: Yes Atrophie Blanche: No Crepitus: No Cyanosis: No Excoriation: No Ecchymosis: No Induration: No Erythema: No Rash: No Hemosiderin Staining: No Scarring: No Mottled: No Pallor: No Moisture Rubor: No No Abnormalities Noted: No Dry / Scaly: No Temperature / Pain Maceration: Yes Temperature: No Abnormality Tenderness on Palpation: Yes Wound Preparation Ulcer Cleansing: Rinsed/Irrigated with Saline, Other: soap and water, Topical Anesthetic Applied: Other: lidocaine 4%, Electronic Signature(s) Signed: 02/14/2019 4:18:49 PM By: Montey Hora Entered By: Montey Hora on 02/13/2019 14:46:02 [place], [name] ([id number]) -------------------------------------------------------------------------------- [place] Details Patient Name: [name] Date of Service: 02/13/2019 2:15 PM Medical Record Number: [id number] Patient Account Number: [id number] Date of Birth/Sex: 1946-03-01 (73 y.o. M) Treating RN: Montey Hora Primary Care Essense Bousquet: Glendon Axe Other Clinician: Referring Liadan Guizar: Glendon Axe Treating Cherlyn Syring/Extender: STONE III, HOYT Weeks in Treatment: 9 Vital Signs Time Taken: 14:30 Temperature (F): 98.3 Height (in): 72 Pulse (bpm): 74 Weight (lbs): 250 Respiratory Rate (breaths/min): 16 Body Mass Index (BMI): 33.9 Blood Pressure (mmHg): 121/58 Reference Range: 80 - 120 mg / dl Airway Electronic Signature(s) Signed: 02/14/2019 4:18:49 PM By: Montey Hora Entered By: Montey Hora on  02/13/2019 14:32:23

## 2019-02-16 ENCOUNTER — Encounter: Payer: Medicare Other | Admitting: Physician Assistant

## 2019-02-16 DIAGNOSIS — E11621 Type 2 diabetes mellitus with foot ulcer: Secondary | ICD-10-CM | POA: Diagnosis not present

## 2019-02-16 NOTE — Progress Notes (Signed)
ELSTER, CORBELLO ([id number]) Visit Report for 01/23/2019 Chief Complaint Document Details Patient Name: [name], [name]. Date of Service: 01/23/2019 3:30 PM Medical Record Number: [id number] Patient Account Number: [id number] Date of Birth/Sex: 12-02-1946 (73 y.o. M) Treating RN: Cornell Barman Primary Care Provider: Glendon Axe Other Clinician: Referring Provider: Glendon Axe Treating Provider/Extender: Melburn Hake, HOYT Weeks in Treatment: 6 Information Obtained from: Patient Chief Complaint Left great toe ulcer Electronic Signature(s) Signed: 01/27/2019 9:05:33 PM By: Worthy Keeler PA-C Entered By: Worthy Keeler on 01/23/2019 15:28:58 [name] ([id number]) -------------------------------------------------------------------------------- Debridement Details Patient Name: [name]. Date of Service: 01/23/2019 3:30 PM Medical Record Number: [id number] Patient Account Number: [id number] Date of Birth/Sex: 08-02-1946 (73 y.o. M) Treating RN: Cornell Barman Primary Care Provider: Glendon Axe Other Clinician: Referring Provider: Glendon Axe Treating Provider/Extender: STONE III, HOYT Weeks in Treatment: 6 Debridement Performed for Wound #1 Left Toe Great Assessment: Performed By: Physician STONE III, HOYT E., PA-C Debridement Type: Debridement Severity of Tissue Pre Fat layer exposed Debridement: Level of Consciousness (Pre- Awake and Alert procedure): Pre-procedure Verification/Time Yes - 16:07 Out Taken: Start Time: 16:07 Pain Control: Lidocaine Total Area Debrided (L x W): 0.4 (cm) x 0.4 (cm) = 0.16 (cm) Tissue and other material Viable, Non-Viable, Callus, Slough, Subcutaneous, Slough debrided: Level: Skin/Subcutaneous Tissue Debridement Description: Excisional Instrument: Curette Bleeding: Minimum Hemostasis Achieved: Pressure End Time: 16:07 Response to Treatment: Procedure was tolerated well Level of  Consciousness Awake and Alert (Post-procedure): Post Debridement Measurements of Total Wound Length: (cm) 0.4 Width: (cm) 0.4 Depth: (cm) 0.1 Volume: (cm) 0.013 Character of Wound/Ulcer Post Debridement: Requires Further Debridement Severity of Tissue Post Debridement: Fat layer exposed Post Procedure Diagnosis Same as Pre-procedure Electronic Signature(s) Signed: 01/23/2019 4:45:46 PM By: Gretta Cool, BSN, RN, CWS, Kim RN, BSN Signed: 01/27/2019 9:05:33 PM By: Worthy Keeler PA-C Entered By: Gretta Cool, BSN, RN, CWS, Kim on 01/23/2019 16:07:54 [name], [name] ([id number]) -------------------------------------------------------------------------------- HPI Details Patient Name: [name]. Date of Service: 01/23/2019 3:30 PM Medical Record Number: [id number] Patient Account Number: [id number] Date of Birth/Sex: 14-Jun-1946 (73 y.o. M) Treating RN: Cornell Barman Primary Care Provider: Glendon Axe Other Clinician: Referring Provider: Glendon Axe Treating Provider/Extender: Melburn Hake, HOYT Weeks in Treatment: 6 History of Present Illness HPI Description: 12/12/18 on evaluation today patient presents as a referral from his endocrinologist regarding issues that he has been having with his left great toe. Subsequently he has been seeing Dr. Cleda Mccreedy and Dr. Cleda Mccreedy has been the breeding the wound and in fact this was debrided this morning. The patient had an appointment there prior to coming here. Subsequently he states that even though he's been going for regular debridement after office things really do not seem to be showing signs of improvement unfortunately. He states that he is having some discomfort but nothing too significant. No fevers, chills, nausea, or vomiting noted at this time. The patient does have a history of hypertension, neuropathy, diabetes mellitus type II, and a history of stroke. Currently he's been using bacitracin on the toe and utilizing a Band-Aid. He tells  me he's had this wound for two years. He cannot remember the last time he had an x-ray. 12/19/18 on evaluation today patient appears to be doing well in regard to his plantar toe ulcer. He's been tolerating the dressing changes without complication. Fortunately there does not appear to be signs of infection at this time which is good news. No fever chills noted. 01/19/19 has been one month since I last  saw the patient as he was out of town. With that being said on evaluation today it does appear that he is going to require sharp debridement and I do believe that the total contact cast would benefit him as far as being applied at this time. He is in agreement that plan. 01/23/19 on evaluation today patient appears to be doing better in regard to his left great toe ulcer. He has been shown signs of improvement week by week and although this is slow it does seem to be doing fairly well which is good news. Overall very pleased in this regard. Electronic Signature(s) Signed: 01/27/2019 9:05:33 PM By: Worthy Keeler PA-C Entered By: Worthy Keeler on 01/27/2019 20:54:42 [name] ([id number]) -------------------------------------------------------------------------------- Physical Exam Details Patient Name: [name] Date of Service: 01/23/2019 3:30 PM Medical Record Number: [id number] Patient Account Number: [id number] Date of Birth/Sex: 07-14-1946 (74 y.o. M) Treating RN: Cornell Barman Primary Care Provider: Glendon Axe Other Clinician: Referring Provider: Glendon Axe Treating Provider/Extender: STONE III, HOYT Weeks in Treatment: 6 Constitutional Well-nourished and well-hydrated in no acute distress. Respiratory normal breathing without difficulty. Psychiatric this patient is able to make decisions and demonstrates good insight into disease process. Alert and Oriented x 3. pleasant and cooperative. Notes Patient's wound bed today did require sharp debridement  which he tolerated without complication. Post debridement the wound bed appears to be doing much better which is excellent news. Overall I'm happy that he does seem to be making progress. Electronic Signature(s) Signed: 01/27/2019 9:05:33 PM By: Worthy Keeler PA-C Entered By: Worthy Keeler on 01/27/2019 20:55:14 [name] ([id number]) -------------------------------------------------------------------------------- Physician Orders Details Patient Name: [name] Date of Service: 01/23/2019 3:30 PM Medical Record Number: [id number] Patient Account Number: [id number] Date of Birth/Sex: 11-06-1946 (73 y.o. M) Treating RN: Cornell Barman Primary Care Provider: Glendon Axe Other Clinician: Referring Provider: Glendon Axe Treating Provider/Extender: Melburn Hake, HOYT Weeks in Treatment: 6 Verbal / Phone Orders: No Diagnosis Coding ICD-10 Coding Code Description E11.621 Type 2 diabetes mellitus with foot ulcer L97.522 Non-pressure chronic ulcer of other part of left foot with fat layer exposed I10 Essential (primary) hypertension E11.43 Type 2 diabetes mellitus with diabetic autonomic (poly)neuropathy Wound Cleansing Wound #1 Left Toe Great o May shower with protection. - PLEASE DO NOT GET CAST WET Anesthetic (add to Medication List) Wound #1 Left Toe Great o Topical Lidocaine 4% cream applied to wound bed prior to debridement (In Clinic Only). Primary Wound Dressing Wound #1 Left Toe Great o Silver Alginate Secondary Dressing Wound #1 Left Toe Great o Foam Dressing Change Frequency Wound #1 Left Toe Great o Change dressing every week Follow-up Appointments Wound #1 Left Toe Great o Return Appointment in 1 week. o Nurse Visit as needed Off-Loading Wound #1 Left Toe Great o Total Contact Cast to Left Lower Extremity Electronic Signature(s) Signed: 01/23/2019 4:45:46 PM By: Gretta Cool, BSN, RN, CWS, Kim RN, BSN Signed: 01/27/2019 9:05:33 PM  By: Irean Hong [place], [name] ([id number]) Entered By: Gretta Cool, BSN, RN, CWS, Kim on 01/23/2019 16:08:57 Clements, [name] ([id number]) -------------------------------------------------------------------------------- Problem List Details Patient Name: JULLIAN, CLAYSON Date of Service: 01/23/2019 3:30 PM Medical Record Number: [id number] Patient Account Number: [id number] Date of Birth/Sex: 02/08/1946 (73 y.o. M) Treating RN: Cornell Barman Primary Care Provider: Glendon Axe Other Clinician: Referring Provider: Glendon Axe Treating Provider/Extender: Melburn Hake, HOYT Weeks in Treatment: 6 Active Problems ICD-10 Evaluated Encounter Code Description Active Date Today Diagnosis E11.621 Type 2  diabetes mellitus with foot ulcer 12/12/2018 No Yes L97.522 Non-pressure chronic ulcer of other part of left foot with fat 12/12/2018 No Yes layer exposed I10 Essential (primary) hypertension 12/12/2018 No Yes E11.43 Type 2 diabetes mellitus with diabetic autonomic (poly) 12/12/2018 No Yes neuropathy Inactive Problems Resolved Problems Electronic Signature(s) Signed: 01/27/2019 9:05:33 PM By: Worthy Keeler PA-C Entered By: Worthy Keeler on 01/23/2019 15:28:52 [name], [name] ([id number]) -------------------------------------------------------------------------------- Progress Note Details Patient Name: [name]. Date of Service: 01/23/2019 3:30 PM Medical Record Number: [id number] Patient Account Number: [id number] Date of Birth/Sex: [birth year] (73 y.o. M) Treating RN: Cornell Barman Primary Care Provider: Glendon Axe Other Clinician: Referring Provider: Glendon Axe Treating Provider/Extender: Melburn Hake, HOYT Weeks in Treatment: 6 Subjective Chief Complaint Information obtained from Patient Left great toe ulcer History of Present Illness (HPI) 12/12/18 on evaluation today patient presents as a referral from his endocrinologist regarding  issues that he has been having with his left great toe. Subsequently he has been seeing Dr. Cleda Mccreedy and Dr. Cleda Mccreedy has been the breeding the wound and in fact this was debrided this morning. The patient had an appointment there prior to coming here. Subsequently he states that even though he's been going for regular debridement after office things really do not seem to be showing signs of improvement unfortunately. He states that he is having some discomfort but nothing too significant. No fevers, chills, nausea, or vomiting noted at this time. The patient does have a history of hypertension, neuropathy, diabetes mellitus type II, and a history of stroke. Currently he's been using bacitracin on the toe and utilizing a Band-Aid. He tells me he's had this wound for two years. He cannot remember the last time he had an x-ray. 12/19/18 on evaluation today patient appears to be doing well in regard to his plantar toe ulcer. He's been tolerating the dressing changes without complication. Fortunately there does not appear to be signs of infection at this time which is good news. No fever chills noted. 01/19/19 has been one month since I last saw the patient as he was out of town. With that being said on evaluation today it does appear that he is going to require sharp debridement and I do believe that the total contact cast would benefit him as far as being applied at this time. He is in agreement that plan. 01/23/19 on evaluation today patient appears to be doing better in regard to his left great toe ulcer. He has been shown signs of improvement week by week and although this is slow it does seem to be doing fairly well which is good news. Overall very pleased in this regard. Patient History Information obtained from Patient. Family History Diabetes - Mother, Heart Disease - Father, Stroke - Father, No family history of Cancer, Hereditary Spherocytosis, Hypertension, Kidney Disease, Lung Disease,  Seizures, Thyroid Problems, Tuberculosis. Social History Never smoker, Marital Status - Widowed, Alcohol Use - Never, Drug Use - No History, Caffeine Use - Moderate. Medical History Eyes Denies history of Cataracts, Glaucoma, Optic Neuritis Ear/Nose/Mouth/Throat Denies history of Chronic sinus problems/congestion, Middle ear problems Hematologic/Lymphatic Denies history of Anemia, Hemophilia, Human Immunodeficiency Virus, Lymphedema Respiratory [name], [name] ([id number]) Denies history of Aspiration, Asthma, Chronic Obstructive Pulmonary Disease (COPD), Pneumothorax, Sleep Apnea, Tuberculosis Cardiovascular Patient has history of Hypertension Denies history of Angina, Arrhythmia, Congestive Heart Failure, Coronary Artery Disease, Deep Vein Thrombosis, Hypotension, Myocardial Infarction, Peripheral Arterial Disease, Peripheral Venous Disease, Phlebitis, Vasculitis Gastrointestinal Denies history of Cirrhosis , Colitis,  Crohn s, Hepatitis A, Hepatitis B, Hepatitis C Endocrine Patient has history of Type II Diabetes Denies history of Type I Diabetes Genitourinary Denies history of End Stage Renal Disease Immunological Denies history of Lupus Erythematosus, Raynaud s, Scleroderma Integumentary (Skin) Denies history of History of Burn, History of pressure wounds Musculoskeletal Denies history of Gout, Rheumatoid Arthritis, Osteoarthritis, Osteomyelitis Neurologic Denies history of Dementia, Neuropathy, Quadriplegia, Paraplegia, Seizure Disorder Oncologic Denies history of Received Chemotherapy, Received Radiation Review of Systems (ROS) Eyes The patient has no complaints or symptoms. Respiratory The patient has no complaints or symptoms. Cardiovascular The patient has no complaints or symptoms. Psychiatric The patient has no complaints or symptoms. Objective Constitutional Well-nourished and well-hydrated in no acute distress. Vitals Time Taken: 3:29 PM, Height: 72  in, Weight: 250 lbs, BMI: 33.9, Temperature: 98.6 F, Pulse: 90 bpm, Respiratory Rate: 16 breaths/min, Blood Pressure: 142/82 mmHg. Respiratory normal breathing without difficulty. Psychiatric this patient is able to make decisions and demonstrates good insight into disease process. Alert and Oriented x 3. pleasant and cooperative. [name], [name] ([id number]) General Notes: Patient's wound bed today did require sharp debridement which he tolerated without complication. Post debridement the wound bed appears to be doing much better which is excellent news. Overall I'm happy that he does seem to be making progress. Integumentary (Hair, Skin) Wound #1 status is Open. Original cause of wound was Gradually Appeared. The wound is located on the Left Toe Great. The wound measures 0.4cm length x 0.4cm width x 0.1cm depth; 0.126cm^2 area and 0.013cm^3 volume. There is Fat Layer (Subcutaneous Tissue) Exposed exposed. There is no tunneling or undermining noted. There is a medium amount of serous drainage noted. The wound margin is thickened. There is large (67-100%) pink granulation within the wound bed. There is no necrotic tissue within the wound bed. The periwound skin appearance exhibited: Callus, Maceration. The periwound skin appearance did not exhibit: Crepitus, Excoriation, Induration, Rash, Scarring, Dry/Scaly, Atrophie Blanche, Cyanosis, Ecchymosis, Hemosiderin Staining, Mottled, Pallor, Rubor, Erythema. Periwound temperature was noted as No Abnormality. The periwound has tenderness on palpation. Assessment Active Problems ICD-10 Type 2 diabetes mellitus with foot ulcer Non-pressure chronic ulcer of other part of left foot with fat layer exposed Essential (primary) hypertension Type 2 diabetes mellitus with diabetic autonomic (poly)neuropathy Procedures Wound #1 Pre-procedure diagnosis of Wound #1 is a Diabetic Wound/Ulcer of the Lower Extremity located on the Left Toe Great  .Severity of Tissue Pre Debridement is: Fat layer exposed. There was a Excisional Skin/Subcutaneous Tissue Debridement with a total area of 0.16 sq cm performed by STONE III, HOYT E., PA-C. With the following instrument(s): Curette to remove Viable and Non-Viable tissue/material. Material removed includes Callus, Subcutaneous Tissue, and Slough after achieving pain control using Lidocaine. No specimens were taken. A time out was conducted at 16:07, prior to the start of the procedure. A Minimum amount of bleeding was controlled with Pressure. The procedure was tolerated well. Post Debridement Measurements: 0.4cm length x 0.4cm width x 0.1cm depth; 0.013cm^3 volume. Character of Wound/Ulcer Post Debridement requires further debridement. Severity of Tissue Post Debridement is: Fat layer exposed. Post procedure Diagnosis Wound #1: Same as Pre-Procedure Pre-procedure diagnosis of Wound #1 is a Diabetic Wound/Ulcer of the Lower Extremity located on the Left Toe Great . There was a Total Contact Cast Procedure by STONE III, HOYT E., PA-C. Post procedure Diagnosis Wound #1: Same as Pre-Procedure Plan [name], [name] Wound Cleansing: Wound #1 Left Toe Great: May shower with protection. - PLEASE DO NOT  GET CAST WET Anesthetic (add to Medication List): Wound #1 Left Toe Great: Topical Lidocaine 4% cream applied to wound bed prior to debridement (In Clinic Only). Primary Wound Dressing: Wound #1 Left Toe Great: Silver Alginate Secondary Dressing: Wound #1 Left Toe Great: Foam Dressing Change Frequency: Wound #1 Left Toe Great: Change dressing every week Follow-up Appointments: Wound #1 Left Toe Great: Return Appointment in 1 week. Nurse Visit as needed Off-Loading: Wound #1 Left Toe Great: Total Contact Cast to Left Lower Extremity My suggestion currently is gonna be that we go ahead and continue with the above wound care measures for the next week. The patient is in  agreement with the plan. We will subsequently see were things stand at follow-up. Please see above for specific wound care orders. We will see patient for re-evaluation in 1 week(s) here in the clinic. If anything worsens or changes patient will contact our office for additional recommendations. I did go ahead and apply the total contact cast today myself which the patient tolerated without complication and post application appear to be doing very well. My hope is that he will continue to show signs of excellent improvement over the next week. Electronic Signature(s) Signed: 01/27/2019 9:05:33 PM By: Worthy Keeler PA-C Entered By: Worthy Keeler on 01/27/2019 20:55:58 [name] ([id number]) -------------------------------------------------------------------------------- ROS/PFSH Details Patient Name: [name] Date of Service: 01/23/2019 3:30 PM Medical Record Number: [id number] Patient Account Number: [id number] Date of Birth/Sex: 10-21-1946 (73 y.o. M) Treating RN: Cornell Barman Primary Care Provider: Glendon Axe Other Clinician: Referring Provider: Glendon Axe Treating Provider/Extender: STONE III, HOYT Weeks in Treatment: 6 Information Obtained From Patient Wound History Do you currently have one or more open woundso No Have you tested positive for osteomyelitis (bone infection)o No Have you had any tests for circulation on your legso No Eyes Complaints and Symptoms: No Complaints or Symptoms Medical History: Negative for: Cataracts; Glaucoma; Optic Neuritis Ear/Nose/Mouth/Throat Medical History: Negative for: Chronic sinus problems/congestion; Middle ear problems Hematologic/Lymphatic Medical History: Negative for: Anemia; Hemophilia; Human Immunodeficiency Virus; Lymphedema Respiratory Complaints and Symptoms: No Complaints or Symptoms Medical History: Negative for: Aspiration; Asthma; Chronic Obstructive Pulmonary Disease (COPD);  Pneumothorax; Sleep Apnea; Tuberculosis Cardiovascular Complaints and Symptoms: No Complaints or Symptoms Medical History: Positive for: Hypertension Negative for: Angina; Arrhythmia; Congestive Heart Failure; Coronary Artery Disease; Deep Vein Thrombosis; Hypotension; Myocardial Infarction; Peripheral Arterial Disease; Peripheral Venous Disease; Phlebitis; Vasculitis Gastrointestinal Medical History: Negative for: Cirrhosis ; Colitis; Crohnos; Hepatitis A; Hepatitis B; Hepatitis C [name], [name] ([id number]) Endocrine Medical History: Positive for: Type II Diabetes Negative for: Type I Diabetes Time with diabetes: 1990 Treated with: Insulin Blood sugar tested every day: Yes Tested : Genitourinary Medical History: Negative for: End Stage Renal Disease Immunological Medical History: Negative for: Lupus Erythematosus; Raynaudos; Scleroderma Integumentary (Skin) Medical History: Negative for: History of Burn; History of pressure wounds Musculoskeletal Medical History: Negative for: Gout; Rheumatoid Arthritis; Osteoarthritis; Osteomyelitis Neurologic Medical History: Negative for: Dementia; Neuropathy; Quadriplegia; Paraplegia; Seizure Disorder Oncologic Medical History: Negative for: Received Chemotherapy; Received Radiation Psychiatric Complaints and Symptoms: No Complaints or Symptoms Immunizations Pneumococcal Vaccine: Received Pneumococcal Vaccination: Yes Implantable Devices Family and Social History Cancer: No; Diabetes: Yes - Mother; Heart Disease: Yes - Father; Hereditary Spherocytosis: No; Hypertension: No; Kidney Disease: No; Lung Disease: No; Seizures: No; Stroke: Yes - Father; Thyroid Problems: No; Tuberculosis: No; Never smoker; Marital Status - Widowed; Alcohol Use: Never; Drug Use: No History; Caffeine Use: Moderate; Financial Concerns: No; Food, Games developer or Shelter  Needs: No; Support System Lacking: No; Transportation Concerns: No; Advanced  Directives: Yes (Not Provided); Patient does not want information on Advanced Directives; Do not resuscitate: Yes (Not Provided); Living Will: Yes (Not Provided); Medical Power of Attorney: Yes (Not Provided) Physician [place], [place]ose[place] ([id number]) I have reviewed and agree with the above information. Electronic Signature(s) Signed: 01/27/2019 9:05:33 PM By: Worthy Keeler PA-C Signed: 02/16/2019 7:49:08 AM By: Gretta Cool BSN, RN, CWS, Kim RN, BSN Entered By: Worthy Keeler on 01/27/2019 20:55:02 [name] ([id number]) -------------------------------------------------------------------------------- Total Contact Cast Details Patient Name: [name], [name] Date of Service: 01/23/2019 3:30 PM Medical Record Number: [id number] Patient Account Number: [id number] Date of Birth/Sex: 15-Nov-1946 (73 y.o. M) Treating RN: Cornell Barman Primary Care Provider: Glendon Axe Other Clinician: Referring Provider: Glendon Axe Treating Provider/Extender: Melburn Hake, HOYT Weeks in Treatment: 6 Total Contact Cast Applied for Wound Assessment: Wound #1 Left Toe Great Performed By: Physician Emilio Math., PA-C Post Procedure Diagnosis Same as Pre-procedure Electronic Signature(s) Signed: 01/23/2019 4:45:46 PM By: Gretta Cool, BSN, RN, CWS, Kim RN, BSN Signed: 01/27/2019 9:05:33 PM By: Worthy Keeler PA-C Entered By: Gretta Cool, BSN, RN, CWS, Kim on 01/23/2019 16:08:18 [name] ([id number]) -------------------------------------------------------------------------------- SuperBill Details Patient Name: [name] Date of Service: 01/23/2019 Medical Record Number: [id number] Patient Account Number: [id number] Date of Birth/Sex: 1946-01-22 (73 y.o. M) Treating RN: Cornell Barman Primary Care Provider: Glendon Axe Other Clinician: Referring Provider: Glendon Axe Treating Provider/Extender: Melburn Hake, HOYT Weeks in Treatment: 6 Diagnosis  Coding ICD-10 Codes Code Description E11.621 Type 2 diabetes mellitus with foot ulcer L97.522 Non-pressure chronic ulcer of other part of left foot with fat layer exposed I10 Essential (primary) hypertension E11.43 Type 2 diabetes mellitus with diabetic autonomic (poly)neuropathy Facility Procedures CPT4 Code: [id number] Description: 56812 - DEB SUBQ TISSUE 20 SQ CM/< ICD-10 Diagnosis Description L97.522 Non-pressure chronic ulcer of other part of left foot with fat Modifier: layer exposed Quantity: 1 Physician Procedures CPT4 Code: [id number] Description: 74944 - WC PHYS SUBQ TISS 20 SQ CM ICD-10 Diagnosis Description L97.522 Non-pressure chronic ulcer of other part of left foot with fat Modifier: layer exposed Quantity: 1 Electronic Signature(s) Signed: 01/27/2019 9:05:33 PM By: Worthy Keeler PA-C Entered By: Worthy Keeler on 01/23/2019 23:52:28

## 2019-02-20 ENCOUNTER — Ambulatory Visit: Payer: Non-veteran care | Admitting: Physician Assistant

## 2019-02-24 NOTE — Progress Notes (Signed)
[name], [name] ([id number]) Visit Report for 02/16/2019 Arrival Information Details Patient Name: [name], [name]. Date of Service: 02/16/2019 8:15 AM Medical Record Number: [id number] Patient Account Number: [id number] Date of Birth/Sex: 05-19-1946 (73 y.o. M) Treating RN: Harold Barban Primary Care Florentina Marquart: Glendon Axe Other Clinician: Referring Blayke Cordrey: Glendon Axe Treating Abrahim Sargent/Extender: STONE III, HOYT Weeks in Treatment: 9 Visit Information History Since Last Visit Added or deleted any medications: No Patient Arrived: Cane Any new allergies or adverse reactions: No Arrival Time: 08:15 Had a fall or experienced change in No Accompanied By: self activities of daily living that may affect Transfer Assistance: None risk of falls: Patient Identification Verified: Yes Signs or symptoms of abuse/neglect since last visito No Secondary Verification Process Completed: Yes Hospitalized since last visit: No Patient Has Alerts: Yes Implantable device outside of the clinic excluding No Patient Alerts: DMII cellular tissue based products placed in the center since last visit: Has Dressing in Place as Prescribed: Yes Pain Present Now: Yes Electronic SignatureLamberts) Signed: 02/16/2019 3:13:56 PM By: Lorine Bears RCP, RRT, CHT Entered By: Lorine Bears on 02/16/2019 08:15:39 [place], [name] ([id number]) -------------------------------------------------------------------------------- Clinic Level of Care Assessment Details Patient Name: [name] Date of Service: 02/16/2019 8:15 AM Medical Record Number: [id number] Patient Account Number: [id number] Date of Birth/Sex: 04/03/1946 (73 y.o. M) Treating RN: Army Melia Primary Care Willena Jeancharles: Glendon Axe Other Clinician: Referring Kerriann Kamphuis: Glendon Axe Treating Sholonda Jobst/Extender: STONE III, HOYT Weeks in Treatment: 9 Clinic Level of Care Assessment Items TOOL  4 Quantity Score []  - Use when only an EandM is performed on FOLLOW-UP visit 0 ASSESSMENTS - Nursing Assessment / Reassessment []  - Reassessment of Co-morbidities (includes updates in patient status) 0 X- 1 5 Reassessment of Adherence to Treatment Plan ASSESSMENTS - Wound and Skin Assessment / Reassessment X - Simple Wound Assessment / Reassessment - one wound 1 5 []  - 0 Complex Wound Assessment / Reassessment - multiple wounds []  - 0 Dermatologic / Skin Assessment (not related to wound area) ASSESSMENTS - Focused Assessment []  - Circumferential Edema Measurements - multi extremities 0 []  - 0 Nutritional Assessment / Counseling / Intervention []  - 0 Lower Extremity Assessment (monofilament, tuning fork, pulses) []  - 0 Peripheral Arterial Disease Assessment (using hand held doppler) ASSESSMENTS - Ostomy and/or Continence Assessment and Care []  - Incontinence Assessment and Management 0 []  - 0 Ostomy Care Assessment and Management (repouching, etc.) PROCESS - Coordination of Care X - Simple Patient / Family Education for ongoing care 1 15 []  - 0 Complex (extensive) Patient / Family Education for ongoing care []  - 0 Staff obtains Programmer, systems, Records, Test Results / Process Orders []  - 0 Staff telephones HHA, Nursing Homes / Clarify orders / etc []  - 0 Routine Transfer to another Facility (non-emergent condition) []  - 0 Routine Hospital Admission (non-emergent condition) []  - 0 New Admissions / Biomedical engineer / Ordering NPWT, Apligraf, etc. []  - 0 Emergency Hospital Admission (emergent condition) X- 1 10 Simple Discharge Coordination [name], [name] ([id number]) []  - 0 Complex (extensive) Discharge Coordination PROCESS - Special Needs []  - Pediatric / Minor Patient Management 0 []  - 0 Isolation Patient Management []  - 0 Hearing / Language / Visual special needs []  - 0 Assessment of Community assistance (transportation, D/C planning, etc.) []  -  0 Additional assistance / Altered mentation []  - 0 Support SurfaceLamberts) Assessment (bed, cushion, seat, etc.) INTERVENTIONS - Wound Cleansing / Measurement []  - Simple Wound Cleansing - one wound 0 []  - 0 Complex  Wound Cleansing - multiple wounds X- 1 5 Wound Imaging (photographs - any number of wounds) []  - 0 Wound Tracing (instead of photographs) []  - 0 Simple Wound Measurement - one wound []  - 0 Complex Wound Measurement - multiple wounds INTERVENTIONS - Wound Dressings X - Small Wound Dressing one or multiple wounds 1 10 []  - 0 Medium Wound Dressing one or multiple wounds []  - 0 Large Wound Dressing one or multiple wounds []  - 0 Application of Medications - topical []  - 0 Application of Medications - injection INTERVENTIONS - Miscellaneous []  - External ear exam 0 []  - 0 Specimen Collection (cultures, biopsies, blood, body fluids, etc.) []  - 0 SpecimenLamberts) / CultureLamberts) sent or taken to Lab for analysis []  - 0 Patient Transfer (multiple staff / Civil Service fast streamer / Similar devices) []  - 0 Simple Staple / Suture removal (25 or less) []  - 0 Complex Staple / Suture removal (26 or more) []  - 0 Hypo / Hyperglycemic Management (close monitor of Blood Glucose) []  - 0 Ankle / Brachial Index (ABI) - do not check if billed separately X- 1 5 Vital Signs [place], [name] ([id number]) Has the patient been seen at the hospital within the last three years: Yes Total Score: 55 Level Of Care: New/Established - Level 2 Electronic SignatureLamberts) Signed: 02/17/2019 4:09:31 PM By: Army Melia Entered By: Army Melia on 02/16/2019 09:01:00 [name]) -------------------------------------------------------------------------------- Encounter Discharge Information Details Patient Name: [name]. Date of Service: 02/16/2019 8:15 AM Medical Record Number: [id number] Patient Account Number: [id number] Date of Birth/Sex: [birth year] (73 y.o. M) Treating  RN: Army Melia Primary Care Lee Kalt: Glendon Axe Other Clinician: Referring Jayleen Scaglione: Glendon Axe Treating Henri Baumler/Extender: STONE III, HOYT Weeks in Treatment: 9 Encounter Discharge Information Items Discharge Condition: Stable Ambulatory Status: Cane Discharge Destination: Home Transportation: Private Auto Accompanied By: self Schedule Follow-up Appointment: No Clinical Summary of Care: Electronic SignatureLamberts) Signed: 02/17/2019 4:09:31 PM By: Army Melia Entered By: Army Melia on 02/16/2019 09:01:54 [name]) -------------------------------------------------------------------------------- Lower Extremity Assessment Details Patient Name: [name] Date of Service: 02/16/2019 8:15 AM Medical Record Number: [id number] Patient Account Number: [id number] Date of Birth/Sex: 1946/10/18 (73 y.o. M) Treating RN: Montey Hora Primary Care Zaida Reiland: Glendon Axe Other Clinician: Referring Stefen Juba: Glendon Axe Treating Felicitas Sine/Extender: STONE III, HOYT Weeks in Treatment: 9 Edema Assessment Assessed: [Left: No] [Right: No] Edema: [Left: N] [Right: o] Vascular Assessment Pulses: Dorsalis Pedis Palpable: [Left:Yes] Posterior Tibial Extremity colors, hair growth, and conditions: Extremity Color: [Left:Normal] Hair Growth on Extremity: [Left:No] Temperature of Extremity: [Left:Cool] Capillary Refill: [Left:< 3 seconds] Toe Nail Assessment Left: Right: Thick: No Discolored: Yes Deformed: No Improper Length and Hygiene: Yes Electronic SignatureLamberts) Signed: 02/16/2019 4:31:30 PM By: Montey Hora Entered By: Montey Hora on 02/16/2019 08:34:41 [name], [name] ([id number]) -------------------------------------------------------------------------------- Multi Wound Chart Details Patient Name: [name]. Date of Service: 02/16/2019 8:15 AM Medical Record Number: [id number] Patient Account Number:  [id number] Date of Birth/Sex: Mar 07, 1946 (73 y.o. M) Treating RN: Army Melia Primary Care Riannon Mukherjee: Glendon Axe Other Clinician: Referring Isiaih Hollenbach: Glendon Axe Treating Escarlet Saathoff/Extender: STONE III, HOYT Weeks in Treatment: 9 Vital Signs HeightLambertin): 72 PulseLambertbpm): 78 WeightLambertlbs): 250 Blood PressureLambertmmHg): 169/78 Body Mass IndexLambertBMI): 34 TemperatureLambertF): 98.0 Respiratory Rate 16 (breaths/min): Photos: [1:No Photos] [N/A:N/A] Wound Location: [1:Left Toe Great] [N/A:N/A] Wounding Event: [1:Gradually Appeared] [N/A:N/A] Primary Etiology: [1:Diabetic Wound/Ulcer of the N/A Lower Extremity] Comorbid History: [1:Hypertension, Type II Diabetes N/A] Date Acquired: [1:11/30/2017] [N/A:N/A] Weeks of Treatment: [1:9] [N/A:N/A] Wound Status: [1:Open] [N/A:N/A]  Measurements L x W x D [1:0x0x0] [N/A:N/A] (cm) Area (cm) : [1:0] [N/A:N/A] Volume (cm) : [1:0] [N/A:N/A] % Reduction in Area: [1:100.00%] [N/A:N/A] % Reduction in Volume: [1:100.00%] [N/A:N/A] Classification: [1:Grade 2] [N/A:N/A] Exudate Amount: [1:None Present] [N/A:N/A] Wound Margin: [1:Thickened] [N/A:N/A] Granulation Amount: [1:None Present (0%)] [N/A:N/A] Necrotic Amount: [1:None Present (0%)] [N/A:N/A] Exposed Structures: [1:Fat Layer (Subcutaneous Tissue) Exposed: Yes Fascia: No Tendon: No Muscle: No Joint: No Bone: No] [N/A:N/A] Epithelialization: [1:Large (67-100%)] [N/A:N/A] Periwound Skin Texture: [1:Callus: Yes Excoriation: No Induration: No Crepitus: No Rash: No Scarring: No] [N/A:N/A] Periwound Skin Moisture: [1:Maceration: Yes Dry/Scaly: No] [N/A:N/A] Periwound Skin Color: [N/A:N/A] Atrophie Blanche: No Cyanosis: No Ecchymosis: No Erythema: No Hemosiderin Staining: No Mottled: No Pallor: No Rubor: No Temperature: No Abnormality N/A N/A Tenderness on Palpation: Yes N/A N/A Wound Preparation: Ulcer Cleansing: N/A N/A Rinsed/Irrigated with Saline, Other: soap and water Topical Anesthetic  Applied: Other: lidocaine 4% Treatment Notes Electronic SignatureLamberts) Signed: 02/17/2019 4:09:31 PM By: Army Melia Entered By: Army Melia on 02/16/2019 08:50:23 [name], [name] ([id number]) -------------------------------------------------------------------------------- [place] Details Patient Name: [name] Date of Service: 02/16/2019 8:15 AM Medical Record Number: [id number] Patient Account Number: [id number] Date of Birth/Sex: 22-Mar-1946 (73 y.o. M) Treating RN: Army Melia Primary Care Lamon Rotundo: Glendon Axe Other Clinician: Referring Amye Grego: Glendon Axe Treating Lawan Nanez/Extender: Melburn Hake, HOYT Weeks in Treatment: 9 Active Inactive Electronic SignatureLamberts) Signed: 02/16/2019 9:24:11 AM By: Army Melia Entered By: Army Melia on 02/16/2019 09:24:11 [name], [name] ([id number]) -------------------------------------------------------------------------------- Pain Assessment Details Patient Name: [name] Date of Service: 02/16/2019 8:15 AM Medical Record Number: [id number] Patient Account Number: [id number] Date of Birth/Sex: 1946/09/04 (73 y.o. M) Treating RN: Harold Barban Primary Care Horacio Werth: Glendon Axe Other Clinician: Referring Cherisa Brucker: Glendon Axe Treating Kurstyn Larios/Extender: STONE III, HOYT Weeks in Treatment: 9 Active Problems Location of Pain Severity and Description of Pain Patient Has Paino Yes Site Locations Rate the pain. Current Pain Level: 8 Pain Management and Medication Current Pain Management: Electronic SignatureLamberts) Signed: 02/16/2019 3:13:56 PM By: Lorine Bears RCP, RRT, CHT Signed: 02/21/2019 11:13:25 AM By: Harold Barban Entered By: Lorine Bears on 02/16/2019 08:15:54 [name]) -------------------------------------------------------------------------------- Patient/Caregiver Education Details Patient Name:  [name] Date of Service: 02/16/2019 8:15 AM Medical Record Number: [id number] Patient Account Number: [id number] Date of Birth/Gender: 1946/04/16 (73 y.o. M) Treating RN: Army Melia Primary Care Physician: Glendon Axe Other Clinician: Referring Physician: Glendon Axe Treating Physician/Extender: Sharalyn Ink in Treatment: 9 Education Assessment Education Provided To: Patient Education Topics Provided Wound/Skin Impairment: Handouts: Other: Pt to call if area re-opens Electronic SignatureLamberts) Signed: 02/17/2019 4:09:31 PM By: Army Melia Entered By: Army Melia on 02/16/2019 09:01:33 [place], [name] ([id number]) -------------------------------------------------------------------------------- Wound Assessment Details Patient Name: [name]. Date of Service: 02/16/2019 8:15 AM Medical Record Number: [id number] Patient Account Number: [id number] Date of Birth/Sex: 1946/03/20 (73 y.o. M) Treating RN: Montey Hora Primary Care Mariel Lukins: Glendon Axe Other Clinician: Referring Eran Mistry: Glendon Axe Treating Nehemie Casserly/Extender: STONE III, HOYT Weeks in Treatment: 9 Wound Status Wound Number: 1 Primary Etiology: Diabetic Wound/Ulcer of the Lower Extremity Wound Location: Left Toe Great Wound Status: Open Wounding Event: Gradually Appeared Comorbid Hypertension, Type II Diabetes Date Acquired: 11/30/2017 History: Weeks Of Treatment: 9 Clustered Wound: No Wound Measurements Length: (cm) 0 % Reduc Width: (cm) 0 % Reduc Depth: (cm) 0 Epithel Area: (cm) 0 Tunnel Volume: (cm) 0 Underm tion in Area: 100% tion in Volume: 100% ialization: Large (67-100%) ing: No ining: No Wound  Description Classification: Grade 2 Foul Od Wound Margin: Thickened Slough/ Exudate Amount: None Present or After Cleansing: No Fibrino Yes Wound Bed Granulation Amount: None Present (0%) Exposed Structure Necrotic Amount: None Present  (0%) Fascia Exposed: No Fat Layer (Subcutaneous Tissue) Exposed: Yes Tendon Exposed: No Muscle Exposed: No Joint Exposed: No Bone Exposed: No Periwound Skin Texture Texture Color No Abnormalities Noted: No No Abnormalities Noted: No Callus: Yes Atrophie Blanche: No Crepitus: No Cyanosis: No Excoriation: No Ecchymosis: No Induration: No Erythema: No Rash: No Hemosiderin Staining: No Scarring: No Mottled: No Pallor: No Moisture Rubor: No No Abnormalities Noted: No Dry / Scaly: No Temperature / Pain Maceration: Yes Temperature: No Abnormality Tenderness on Palpation: Yes Wound Preparation DAN, SCEARCE ([id number]) Ulcer Cleansing: Rinsed/Irrigated with Saline, Other: soap and water, Topical Anesthetic Applied: Other: lidocaine 4%, Electronic SignatureLamberts) Signed: 02/16/2019 4:31:30 PM By: Montey Hora Entered By: Montey Hora on 02/16/2019 08:34:07 [name]) -------------------------------------------------------------------------------- [place] Details Patient Name: [name] Date of Service: 02/16/2019 8:15 AM Medical Record Number: [id number] Patient Account Number: [id number] Date of Birth/Sex: 1946/06/19 (73 y.o. M) Treating RN: Harold Barban Primary Care Kaelon Weekes: Glendon Axe Other Clinician: Referring Levy Cedano: Glendon Axe Treating Faylene Allerton/Extender: STONE III, HOYT Weeks in Treatment: 9 Vital Signs Time Taken: 08:15 Temperature (F): 98.0 Height (in): 72 Pulse (bpm): 78 Weight (lbs): 250 Respiratory Rate (breaths/min): 16 Body Mass Index (BMI): 33.9 Blood Pressure (mmHg): 169/78 Reference Range: 80 - 120 mg / dl Notes Patient has not taken his blood pressure medication yet this morning. Electronic SignatureLamberts) Signed: 02/16/2019 3:13:56 PM By: Lorine Bears RCP, RRT, CHT Entered By: Lorine Bears on 02/16/2019 08:19:44

## 2019-03-06 NOTE — Progress Notes (Signed)
RAVIN, DENARDO ([id number]) Visit Report for 02/16/2019 Chief Complaint Document Details Patient Name: [name], [name]. Date of Service: 02/16/2019 8:15 AM Medical Record Number: [id number] Patient Account Number: [id number] Date of Birth/Sex: 1946/07/19 (73 y.o. M) Treating RN: Harold Barban Primary Care Provider: Glendon Axe Other Clinician: Referring Provider: Glendon Axe Treating Provider/Extender: STONE III, Zohar Laing Weeks in Treatment: 9 Information Obtained from: Patient Chief Complaint Left great toe ulcer Electronic Signature(s) Signed: 02/23/2019 9:19:49 AM By: Worthy Keeler PA-C Entered By: Worthy Keeler on 02/16/2019 23:37:23 [name] ([id number]) -------------------------------------------------------------------------------- HPI Details Patient Name: [name] Date of Service: 02/16/2019 8:15 AM Medical Record Number: [id number] Patient Account Number: [id number] Date of Birth/Sex: 02/04/1946 (73 y.o. M) Treating RN: Harold Barban Primary Care Provider: Glendon Axe Other Clinician: Referring Provider: Glendon Axe Treating Provider/Extender: Melburn Hake, Akshith Moncus Weeks in Treatment: 9 History of Present Illness HPI Description: 12/12/18 on evaluation today patient presents as a referral from his endocrinologist regarding issues that he has been having with his left great toe. Subsequently he has been seeing Dr. Cleda Mccreedy and Dr. Cleda Mccreedy has been the breeding the wound and in fact this was debrided this morning. The patient had an appointment there prior to coming here. Subsequently he states that even though he's been going for regular debridement after office things really do not seem to be showing signs of improvement unfortunately. He states that he is having some discomfort but nothing too significant. No fevers, chills, nausea, or vomiting noted at this time. The patient does have a history of hypertension, neuropathy,  diabetes mellitus type II, and a history of stroke. Currently he's been using bacitracin on the toe and utilizing a Band-Aid. He tells me he's had this wound for two years. He cannot remember the last time he had an x-ray. 12/19/18 on evaluation today patient appears to be doing well in regard to his plantar toe ulcer. He's been tolerating the dressing changes without complication. Fortunately there does not appear to be signs of infection at this time which is good news. No fever chills noted. 01/19/19 has been one month since I last saw the patient as he was out of town. With that being said on evaluation today it does appear that he is going to require sharp debridement and I do believe that the total contact cast would benefit him as far as being applied at this time. He is in agreement that plan. 01/23/19 on evaluation today patient appears to be doing better in regard to his left great toe ulcer. He has been shown signs of improvement week by week and although this is slow it does seem to be doing fairly well which is good news. Overall very pleased in this regard. 01/31/19 on evaluation today patient presents for follow-up concerning his great toe ulcer. He was actually supposed to be here yesterday and he did come however he ended up having to leave due to his blood sugar dropping he states he just did not eat enough lunch. Fortunately seems to be doing much better today which is excellent news. Fortunately there's no evidence of infection at this time which is also good news. No fevers, chills, nausea, or vomiting noted at this time. Overall I feel like he is making excellent progress. 02/06/19 on evaluation today patient actually appears to be doing very well in regard to his plantar toe ulcer. He's been tolerating the dressing changes without complication. Fortunately there is no sign of infection at this time. Overall been  very pleased with how things seem to be progressing. No fevers,  chills, nausea, or vomiting noted at this time. 02/13/19 on evaluation today patient actually appears to be doing excellent in regard to his toe ulcer which is almost completely close. Overall very pleased with that. There does not appear to be any signs of infection which is good news. Upon close inspection it appears that he has just a very slight opening still noted at the central portion of the toe although this is minimal. 02/16/19 on evaluation today patient is seen for early follow-up due to the fact that he tells me while at home he actually got up to go answer the door without putting on the walking boot part of his cast and subsequently damaged the total contact cast. It therefore comes in to have this removed and potentially replaced. Fortunately other than it given him a little bit of pain its far as pushing in on some areas there doesn't appear to be any injury once the cast was removed. Electronic Signature(s) Signed: 02/23/2019 9:19:49 AM By: Worthy Keeler PA-C Entered By: Worthy Keeler on 02/23/2019 08:26:47 BROWN, DUNLAP ([id number], FAULCON ([id number]) -------------------------------------------------------------------------------- Physical Exam Details Patient Name: Fred, [name] Date of Service: 02/16/2019 8:15 AM Medical Record Number: [id number] Patient Account Number: [id number] Date of Birth/Sex: 01-Feb-1946 (73 y.o. M) Treating RN: Harold Barban Primary Care Provider: Glendon Axe Other Clinician: Referring Provider: Glendon Axe Treating Provider/Extender: STONE III, Lajarvis Italiano Weeks in Treatment: 9 Constitutional Well-nourished and well-hydrated in no acute distress. Respiratory normal breathing without difficulty. clear to auscultation bilaterally. Cardiovascular regular rate and rhythm with normal S1, S2. Psychiatric this patient is able to make decisions and demonstrates good insight into disease process. Alert and Oriented  x 3. pleasant and cooperative. Notes Upon removal the total contact cast patient's wound actually appears to be completely healed which is excellent news. I do not believe that he really needs the cast any further at this point. This is excellent news. Electronic Signature(s) Signed: 02/23/2019 9:19:49 AM By: Worthy Keeler PA-C Entered By: Worthy Keeler on 02/23/2019 08:27:13 Silverman, [name] ([id number]) -------------------------------------------------------------------------------- Physician Orders Details Patient Name: [name] Date of Service: 02/16/2019 8:15 AM Medical Record Number: [id number] Patient Account Number: [id number] Date of Birth/Sex: 1946/08/29 (73 y.o. M) Treating RN: Army Melia Primary Care Provider: Glendon Axe Other Clinician: Referring Provider: Glendon Axe Treating Provider/Extender: STONE III, Jameal Razzano Weeks in Treatment: 9 Verbal / Phone Orders: No Diagnosis Coding Primary Wound Dressing o Other: - off loading felt Off-Loading o Open toe surgical shoe to: - left foot Discharge From [facility] o Discharge from [facility] - treatment complete Electronic Signature(s) Signed: 02/17/2019 4:09:31 PM By: Army Melia Signed: 02/23/2019 9:19:49 AM By: Worthy Keeler PA-C Entered By: Army Melia on 02/16/2019 Manito, Wausa. ([id number]) -------------------------------------------------------------------------------- Problem List Details Patient Name: [name]. Date of Service: 02/16/2019 8:15 AM Medical Record Number: [id number] Patient Account Number: [id number] Date of Birth/Sex: Oct 09, 1946 (73 y.o. M) Treating RN: Harold Barban Primary Care Provider: Glendon Axe Other Clinician: Referring Provider: Glendon Axe Treating Provider/Extender: Melburn Hake, Vidalia Serpas Weeks in Treatment: 9 Active Problems ICD-10 Evaluated Encounter Code Description Active Date Today Diagnosis E11.621  Type 2 diabetes mellitus with foot ulcer 12/12/2018 No Yes L97.522 Non-pressure chronic ulcer of other part of left foot with fat 12/12/2018 No Yes layer exposed I10 Essential (primary) hypertension 12/12/2018 No Yes E11.43 Type 2 diabetes mellitus with diabetic autonomic (poly)  12/12/2018 No Yes neuropathy Inactive Problems Resolved Problems Electronic Signature(s) Signed: 02/23/2019 9:19:49 AM By: Worthy Keeler PA-C Entered By: Worthy Keeler on 02/16/2019 23:37:18 Blue Eye, [name] ([id number]) -------------------------------------------------------------------------------- Progress Note Details Patient Name: [name] Date of Service: 02/16/2019 8:15 AM Medical Record Number: [id number] Patient Account Number: [id number] Date of Birth/Sex: 05/15/1946 (73 y.o. M) Treating RN: Harold Barban Primary Care Provider: Glendon Axe Other Clinician: Referring Provider: Glendon Axe Treating Provider/Extender: Melburn Hake, Celestine Prim Weeks in Treatment: 9 Subjective Chief Complaint Information obtained from Patient Left great toe ulcer History of Present Illness (HPI) 12/12/18 on evaluation today patient presents as a referral from his endocrinologist regarding issues that he has been having with his left great toe. Subsequently he has been seeing Dr. Cleda Mccreedy and Dr. Cleda Mccreedy has been the breeding the wound and in fact this was debrided this morning. The patient had an appointment there prior to coming here. Subsequently he states that even though he's been going for regular debridement after office things really do not seem to be showing signs of improvement unfortunately. He states that he is having some discomfort but nothing too significant. No fevers, chills, nausea, or vomiting noted at this time. The patient does have a history of hypertension, neuropathy, diabetes mellitus type II, and a history of stroke. Currently he's been using bacitracin on the toe and utilizing a  Band-Aid. He tells me he's had this wound for two years. He cannot remember the last time he had an x-ray. 12/19/18 on evaluation today patient appears to be doing well in regard to his plantar toe ulcer. He's been tolerating the dressing changes without complication. Fortunately there does not appear to be signs of infection at this time which is good news. No fever chills noted. 01/19/19 has been one month since I last saw the patient as he was out of town. With that being said on evaluation today it does appear that he is going to require sharp debridement and I do believe that the total contact cast would benefit him as far as being applied at this time. He is in agreement that plan. 01/23/19 on evaluation today patient appears to be doing better in regard to his left great toe ulcer. He has been shown signs of improvement week by week and although this is slow it does seem to be doing fairly well which is good news. Overall very pleased in this regard. 01/31/19 on evaluation today patient presents for follow-up concerning his great toe ulcer. He was actually supposed to be here yesterday and he did come however he ended up having to leave due to his blood sugar dropping he states he just did not eat enough lunch. Fortunately seems to be doing much better today which is excellent news. Fortunately there's no evidence of infection at this time which is also good news. No fevers, chills, nausea, or vomiting noted at this time. Overall I feel like he is making excellent progress. 02/06/19 on evaluation today patient actually appears to be doing very well in regard to his plantar toe ulcer. He's been tolerating the dressing changes without complication. Fortunately there is no sign of infection at this time. Overall been very pleased with how things seem to be progressing. No fevers, chills, nausea, or vomiting noted at this time. 02/13/19 on evaluation today patient actually appears to be doing  excellent in regard to his toe ulcer which is almost completely close. Overall very pleased with that. There does not appear to be  any signs of infection which is good news. Upon close inspection it appears that he has just a very slight opening still noted at the central portion of the toe although this is minimal. 02/16/19 on evaluation today patient is seen for early follow-up due to the fact that he tells me while at home he actually got up to go answer the door without putting on the walking boot part of his cast and subsequently damaged the total contact cast. It therefore comes in to have this removed and potentially replaced. Fortunately other than it given him a little bit of pain its far as pushing in on some areas there doesn't appear to be any injury once the cast was removed. WASIL, WOLKE ([id number]) Patient History Information obtained from Patient. Family History Diabetes - Mother, Heart Disease - Father, Stroke - Father, No family history of Cancer, Hereditary Spherocytosis, Hypertension, Kidney Disease, Lung Disease, Seizures, Thyroid Problems, Tuberculosis. Social History Never smoker, Marital Status - Widowed, Alcohol Use - Never, Drug Use - No History, Caffeine Use - Moderate. Medical History Eyes Denies history of Cataracts, Glaucoma, Optic Neuritis Ear/Nose/Mouth/Throat Denies history of Chronic sinus problems/congestion, Middle ear problems Hematologic/Lymphatic Denies history of Anemia, Hemophilia, Human Immunodeficiency Virus, Lymphedema Respiratory Denies history of Aspiration, Asthma, Chronic Obstructive Pulmonary Disease (COPD), Pneumothorax, Sleep Apnea, Tuberculosis Cardiovascular Patient has history of Hypertension Denies history of Angina, Arrhythmia, Congestive Heart Failure, Coronary Artery Disease, Deep Vein Thrombosis, Hypotension, Myocardial Infarction, Peripheral Arterial Disease, Peripheral Venous Disease, Phlebitis,  Vasculitis Gastrointestinal Denies history of Cirrhosis , Colitis, Crohn s, Hepatitis A, Hepatitis B, Hepatitis C Endocrine Patient has history of Type II Diabetes Denies history of Type I Diabetes Genitourinary Denies history of End Stage Renal Disease Immunological Denies history of Lupus Erythematosus, Raynaud s, Scleroderma Integumentary (Skin) Denies history of History of Burn, History of pressure wounds Musculoskeletal Denies history of Gout, Rheumatoid Arthritis, Osteoarthritis, Osteomyelitis Neurologic Denies history of Dementia, Neuropathy, Quadriplegia, Paraplegia, Seizure Disorder Oncologic Denies history of Received Chemotherapy, Received Radiation Review of Systems (ROS) Constitutional Symptoms (General Health) Denies complaints or symptoms of Fever, Chills. Respiratory The patient has no complaints or symptoms. Cardiovascular The patient has no complaints or symptoms. Psychiatric The patient has no complaints or symptoms. CLERANCE, UMLAND ([id number]) Objective Constitutional Well-nourished and well-hydrated in no acute distress. Vitals Time Taken: 8:15 AM, Height: 72 in, Weight: 250 lbs, BMI: 33.9, Temperature: 98.0 F, Pulse: 78 bpm, Respiratory Rate: 16 breaths/min, Blood Pressure: 169/78 mmHg. General Notes: Patient has not taken his blood pressure medication yet this morning. Respiratory normal breathing without difficulty. clear to auscultation bilaterally. Cardiovascular regular rate and rhythm with normal S1, S2. Psychiatric this patient is able to make decisions and demonstrates good insight into disease process. Alert and Oriented x 3. pleasant and cooperative. General Notes: Upon removal the total contact cast patient's wound actually appears to be completely healed which is excellent news. I do not believe that he really needs the cast any further at this point. This is excellent news. Integumentary (Hair, Skin) Wound #1 status is Open.  Original cause of wound was Gradually Appeared. The wound is located on the Left Toe Great. The wound measures 0cm length x 0cm width x 0cm depth; 0cm^2 area and 0cm^3 volume. There is Fat Layer (Subcutaneous Tissue) Exposed exposed. There is no tunneling or undermining noted. There is a none present amount of drainage noted. The wound margin is thickened. There is no granulation within the wound bed. There is no necrotic tissue within  the wound bed. The periwound skin appearance exhibited: Callus, Maceration. The periwound skin appearance did not exhibit: Crepitus, Excoriation, Induration, Rash, Scarring, Dry/Scaly, Atrophie Blanche, Cyanosis, Ecchymosis, Hemosiderin Staining, Mottled, Pallor, Rubor, Erythema. Periwound temperature was noted as No Abnormality. The periwound has tenderness on palpation. Assessment Active Problems ICD-10 Type 2 diabetes mellitus with foot ulcer Non-pressure chronic ulcer of other part of left foot with fat layer exposed Essential (primary) hypertension Type 2 diabetes mellitus with diabetic autonomic (poly)neuropathy Plan Primary Wound Dressing: JAICION, LAURIE ([id number]) Other: - off loading felt Off-Loading: Open toe surgical shoe to: - left foot Discharge From [facility] Services: Discharge from [facility] - treatment complete My suggestion currently is gonna be to discontinue the total contact cast we did put in offloading shoe order to continue to take pressure off the area he knows to use corn/callous pads or else orthopedic offloading felt to keep pressure off the region. If anything changes or worsens he doesn't always contact the office and let me know otherwise at this point we're gonna discontinue wound care services. He Artie has the instructions for getting his offloaded shoes of given of that paperwork previous he's been in touch with the [facility]. Electronic Signature(s) Signed: 02/23/2019 9:19:49 AM By: Worthy Keeler  PA-C Entered By: Worthy Keeler on 02/23/2019 08:27:26 [name] ([id number]) -------------------------------------------------------------------------------- ROS/PFSH Details Patient Name: [name] Date of Service: 02/16/2019 8:15 AM Medical Record Number: [id number] Patient Account Number: [id number] Date of Birth/Sex: 08-18-1946 (73 y.o. M) Treating RN: Harold Barban Primary Care Provider: Glendon Axe Other Clinician: Referring Provider: Glendon Axe Treating Provider/Extender: STONE III, Darleen Moffitt Weeks in Treatment: 9 Information Obtained From Patient Wound History Do you currently have one or more open woundso No Have you tested positive for osteomyelitis (bone infection)o No Have you had any tests for circulation on your legso No Constitutional Symptoms (General Health) Complaints and Symptoms: Negative for: Fever; Chills Eyes Medical History: Negative for: Cataracts; Glaucoma; Optic Neuritis Ear/Nose/Mouth/Throat Medical History: Negative for: Chronic sinus problems/congestion; Middle ear problems Hematologic/Lymphatic Medical History: Negative for: Anemia; Hemophilia; Human Immunodeficiency Virus; Lymphedema Respiratory Complaints and Symptoms: No Complaints or Symptoms Medical History: Negative for: Aspiration; Asthma; Chronic Obstructive Pulmonary Disease (COPD); Pneumothorax; Sleep Apnea; Tuberculosis Cardiovascular Complaints and Symptoms: No Complaints or Symptoms Medical History: Positive for: Hypertension Negative for: Angina; Arrhythmia; Congestive Heart Failure; Coronary Artery Disease; Deep Vein Thrombosis; Hypotension; Myocardial Infarction; Peripheral Arterial Disease; Peripheral Venous Disease; Phlebitis; Vasculitis Gastrointestinal LUISMARIO, COSTON ([id number]) Medical History: Negative for: Cirrhosis ; Colitis; Crohnos; Hepatitis A; Hepatitis B; Hepatitis C Endocrine Medical History: Positive for: Type II  Diabetes Negative for: Type I Diabetes Time with diabetes: 1990 Treated with: Insulin Blood sugar tested every day: Yes Tested : Genitourinary Medical History: Negative for: End Stage Renal Disease Immunological Medical History: Negative for: Lupus Erythematosus; Raynaudos; Scleroderma Integumentary (Skin) Medical History: Negative for: History of Burn; History of pressure wounds Musculoskeletal Medical History: Negative for: Gout; Rheumatoid Arthritis; Osteoarthritis; Osteomyelitis Neurologic Medical History: Negative for: Dementia; Neuropathy; Quadriplegia; Paraplegia; Seizure Disorder Oncologic Medical History: Negative for: Received Chemotherapy; Received Radiation Psychiatric Complaints and Symptoms: No Complaints or Symptoms Immunizations Pneumococcal Vaccine: Received Pneumococcal Vaccination: Yes Implantable Devices No devices added Family and Social History Cancer: No; Diabetes: Yes - Mother; Heart Disease: Yes - Father; Hereditary Spherocytosis: No; Hypertension: No; Kidney Disease: No; Lung Disease: No; Seizures: No; Stroke: Yes - Father; Thyroid Problems: No; Tuberculosis: No; Never smoker; Marital Status - Widowed; Alcohol Use: Never; Drug Use:  No History; Caffeine Use: Moderate; Financial Concerns: No; LEAH, SKORA ([id number]) Food, Clothing or Shelter Needs: No; Support System Lacking: No; Transportation Concerns: No; Advanced Directives: Yes (Not Provided); Patient does not want information on Advanced Directives; Do not resuscitate: Yes (Not Provided); Living Will: Yes (Not Provided); Medical Power of Attorney: Yes (Not Provided) Physician Affirmation I have reviewed and agree with the above information. Electronic Signature(s) Signed: 02/23/2019 9:19:49 AM By: Worthy Keeler PA-C Signed: 03/06/2019 10:54:06 AM By: Harold Barban Entered By: Worthy Keeler on 02/23/2019 08:27:01 Picariello, [name]  ([id number]) -------------------------------------------------------------------------------- SuperBill Details Patient Name: [name] Date of Service: 02/16/2019 Medical Record Number: [id number] Patient Account Number: [id number] Date of Birth/Sex: 12/17/1946 (73 y.o. M) Treating RN: Harold Barban Primary Care Provider: Glendon Axe Other Clinician: Referring Provider: Glendon Axe Treating Provider/Extender: Melburn Hake, Diahn Waidelich Weeks in Treatment: 9 Diagnosis Coding ICD-10 Codes Code Description E11.621 Type 2 diabetes mellitus with foot ulcer L97.522 Non-pressure chronic ulcer of other part of left foot with fat layer exposed I10 Essential (primary) hypertension E11.43 Type 2 diabetes mellitus with diabetic autonomic (poly)neuropathy Facility Procedures CPT4 Code: [id number] Description: [phone] - WOUND CARE VISIT-LEV 2 EST PT Modifier: Quantity: 1 Physician Procedures CPT4 Code: [id number] Description: 83291 - WC PHYS LEVEL 3 - EST PT ICD-10 Diagnosis Description E11.621 Type 2 diabetes mellitus with foot ulcer L97.522 Non-pressure chronic ulcer of other part of left foot with fat I10 Essential (primary) hypertension E11.43 Type 2 diabetes  mellitus with diabetic autonomic (poly)neuropa Modifier: layer exposed thy Quantity: 1 Electronic Signature(s) Signed: 02/23/2019 9:19:49 AM By: Worthy Keeler PA-C Entered By: Worthy Keeler on 02/16/2019 23:37:36

## 2019-03-13 ENCOUNTER — Encounter: Payer: Medicare Other | Attending: Physician Assistant | Admitting: Physician Assistant

## 2019-03-13 ENCOUNTER — Other Ambulatory Visit: Payer: Self-pay

## 2019-03-13 DIAGNOSIS — E11621 Type 2 diabetes mellitus with foot ulcer: Secondary | ICD-10-CM | POA: Diagnosis not present

## 2019-03-13 DIAGNOSIS — L97522 Non-pressure chronic ulcer of other part of left foot with fat layer exposed: Secondary | ICD-10-CM | POA: Diagnosis not present

## 2019-03-13 DIAGNOSIS — Z8673 Personal history of transient ischemic attack (TIA), and cerebral infarction without residual deficits: Secondary | ICD-10-CM | POA: Diagnosis not present

## 2019-03-13 DIAGNOSIS — E1140 Type 2 diabetes mellitus with diabetic neuropathy, unspecified: Secondary | ICD-10-CM | POA: Insufficient documentation

## 2019-03-13 DIAGNOSIS — Z833 Family history of diabetes mellitus: Secondary | ICD-10-CM | POA: Insufficient documentation

## 2019-03-13 DIAGNOSIS — Z794 Long term (current) use of insulin: Secondary | ICD-10-CM | POA: Insufficient documentation

## 2019-03-13 DIAGNOSIS — Z8249 Family history of ischemic heart disease and other diseases of the circulatory system: Secondary | ICD-10-CM | POA: Insufficient documentation

## 2019-03-13 DIAGNOSIS — I10 Essential (primary) hypertension: Secondary | ICD-10-CM | POA: Diagnosis not present

## 2019-03-14 NOTE — Progress Notes (Signed)
[name], [name] ([id number]) Visit Report for 03/13/2019 Arrival Information Details Patient Name: Fred, [name]. Date of Service: 03/13/2019 2:15 PM Medical Record Number: [id number] Patient Account Number: [id number] Date of Birth/Sex: [birth year] (73 y.o. M) Treating RN: Cornell Barman Primary Care Pascal Stiggers: Glendon Axe Other Clinician: Referring Asucena Galer: Glendon Axe Treating Denette Hass/Extender: Melburn Hake, HOYT Weeks in Treatment: 13 Visit Information History Since Last Visit Added or deleted any medications: No Patient Arrived: Cane Any new allergies or adverse reactions: No Arrival Time: 14:24 Had a fall or experienced change in No Accompanied By: self activities of daily living that may affect Transfer Assistance: None risk of falls: Patient Identification Verified: Yes Signs or symptoms of abuse/neglect since last visito No Secondary Verification Process Completed: Yes Hospitalized since last visit: No Patient Has Alerts: Yes Implantable device outside of the clinic excluding No Patient Alerts: DMII cellular tissue based products placed in the center since last visit: Pain Present Now: No Electronic Signature(s) Signed: 03/13/2019 5:20:53 PM By: Gretta Cool, BSN, RN, CWS, Kim RN, BSN Entered By: Gretta Cool, BSN, RN, CWS, Kim on 03/13/2019 14:25:06 [name], [name] ([id number]) -------------------------------------------------------------------------------- Lower Extremity Assessment Details Patient Name: [name]. Date of Service: 03/13/2019 2:15 PM Medical Record Number: [id number] Patient Account Number: [id number] Date of Birth/Sex: 08/01/1946 (72 y.o. M) Treating RN: Cornell Barman Primary Care Elisse Pennick: Glendon Axe Other Clinician: Referring Keisean Skowron: Glendon Axe Treating Jerico Grisso/Extender: STONE III, HOYT Weeks in Treatment: 13 Edema Assessment Assessed: [Left: No] [Right: No] Edema: [Left: N] [Right: o] Vascular  Assessment Pulses: Dorsalis Pedis Palpable: [Left:Yes] Posterior Tibial Palpable: [Left:Yes] Extremity colors, hair growth, and conditions: Extremity Color: [Left:Normal] Hair Growth on Extremity: [Left:No] Temperature of Extremity: [Left:Cold] Capillary Refill: [Left:< 3 seconds] Toe Nail Assessment Left: Right: Thick: No Discolored: No Deformed: No Improper Length and Hygiene: No Electronic Signature(s) Signed: 03/13/2019 5:20:53 PM By: Gretta Cool, BSN, RN, CWS, Kim RN, BSN Entered By: Gretta Cool, BSN, RN, CWS, Kim on 03/13/2019 14:33:46 [name], [name] ([id number]) -------------------------------------------------------------------------------- Multi Wound Chart Details Patient Name: [name]. Date of Service: 03/13/2019 2:15 PM Medical Record Number: [id number] Patient Account Number: [id number] Date of Birth/Sex: September 22, 1946 (73 y.o. M) Treating RN: Harold Barban Primary Care Natsumi Whitsitt: Glendon Axe Other Clinician: Referring Grady Mohabir: Glendon Axe Treating Jada Kuhnert/Extender: STONE III, HOYT Weeks in Treatment: 13 Vital Signs Height(in): 72 Pulse(bpm): 72 Weight(lbs): 250 Blood Pressure(mmHg): 127/57 Body Mass Index(BMI): 34 Temperature(F): 98.4 Respiratory Rate 16 (breaths/min): Photos: [N/A:N/A] Wound Location: Left Toe Great N/A N/A Wounding Event: Gradually Appeared N/A N/A Primary Etiology: Diabetic Wound/Ulcer of the N/A N/A Lower Extremity Comorbid History: Hypertension, Type II Diabetes N/A N/A Date Acquired: 11/30/2017 N/A N/A Weeks of Treatment: 13 N/A N/A Wound Status: Open N/A N/A Wound Recurrence: Yes N/A N/A Measurements L x W x D 0.3x0.4x0.2 N/A N/A (cm) Area (cm) : 0.094 N/A N/A Volume (cm) : 0.019 N/A N/A % Reduction in Area: 85.20% N/A N/A % Reduction in Volume: 70.30% N/A N/A Starting Position 1 9 (o'clock): Ending Position 1 4 (o'clock): Maximum Distance 1 (cm): 0.3 Undermining: Yes N/A N/A Classification: Grade  2 N/A N/A Exudate Amount: Small N/A N/A Exudate Type: Serosanguineous N/A N/A Exudate Color: red, brown N/A N/A Wound Margin: Thickened N/A N/A Granulation Amount: Large (67-100%) N/A N/A Granulation Quality: Pale N/A N/A Necrotic Amount: Small (1-33%) N/A N/A [name], [name] ([id number]) Exposed Structures: Fat Layer (Subcutaneous N/A N/A Tissue) Exposed: Yes Fascia: No Tendon: No Muscle: No Joint: No Bone: No Epithelialization: Large (67-100%) N/A N/A Periwound Skin Texture: Excoriation: No  N/A N/A Induration: No Callus: No Crepitus: No Rash: No Scarring: No Periwound Skin Moisture: Maceration: No N/A N/A Dry/Scaly: No Periwound Skin Color: Ecchymosis: Yes N/A N/A Atrophie Blanche: No Cyanosis: No Erythema: No Hemosiderin Staining: No Mottled: No Pallor: No Rubor: No Temperature: No Abnormality N/A N/A Tenderness on Palpation: Yes N/A N/A Wound Preparation: Ulcer Cleansing: N/A N/A Rinsed/Irrigated with Saline, Other: soap and water Topical Anesthetic Applied: Other: lidocaine 4% Treatment Notes Electronic Signature(s) Signed: 03/13/2019 5:03:00 PM By: Harold Barban Entered By: Harold Barban on 03/13/2019 15:45:28 [name], [name] ([id number]) -------------------------------------------------------------------------------- [name] Details Patient Name: [name] Date of Service: 03/13/2019 2:15 PM Medical Record Number: [id number] Patient Account Number: [id number] Date of Birth/Sex: [birth year] (73 y.o. M) Treating RN: Harold Barban Primary Care Alrick Cubbage: Glendon Axe Other Clinician: Referring Rhiannan Kievit: Glendon Axe Treating Luana Tatro/Extender: Melburn Hake, HOYT Weeks in Treatment: 13 Active Inactive Electronic Signature(s) Signed: 03/13/2019 5:03:00 PM By: Harold Barban Entered By: Harold Barban on 03/13/2019 15:44:34 [name], [name]  ([id number]) -------------------------------------------------------------------------------- Pain Assessment Details Patient Name: [name]. Date of Service: 03/13/2019 2:15 PM Medical Record Number: [id number] Patient Account Number: [id number] Date of Birth/Sex: 1946-08-18 (73 y.o. M) Treating RN: Cornell Barman Primary Care Dandrae Kustra: Glendon Axe Other Clinician: Referring Arieon Corcoran: Glendon Axe Treating Kionna Brier/Extender: STONE III, HOYT Weeks in Treatment: 13 Active Problems Location of Pain Severity and Description of Pain Patient Has Paino No Site Locations Pain Management and Medication Current Pain Management: Goals for Pain Management Patient denies pain at this time. Electronic Signature(s) Signed: 03/13/2019 5:20:53 PM By: Gretta Cool, BSN, RN, CWS, Kim RN, BSN Entered By: Gretta Cool, BSN, RN, CWS, Kim on 03/13/2019 14:25:34 [name] ([id number]) -------------------------------------------------------------------------------- Patient/Caregiver Education Details Patient Name: [name] Date of Service: 03/13/2019 2:15 PM Medical Record Number: [id number] Patient Account Number: [id number] Date of Birth/Gender: 14-Feb-1946 (73 y.o. M) Treating RN: Harold Barban Primary Care Physician: Glendon Axe Other Clinician: Referring Physician: Glendon Axe Treating Physician/Extender: Sharalyn Ink in Treatment: 13 Education Assessment Education Provided To: Patient Education Topics Provided Pressure: Handouts: Pressure Ulcers: Care and Offloading Methods: Demonstration, Explain/Verbal Responses: State content correctly Wound/Skin Impairment: Handouts: Caring for Your Ulcer Methods: Demonstration, Explain/Verbal Responses: State content correctly Electronic Signature(s) Signed: 03/13/2019 5:03:00 PM By: Harold Barban Entered By: Harold Barban on 03/13/2019 15:45:56 [place], [name]  ([id number]) -------------------------------------------------------------------------------- Wound Assessment Details Patient Name: [name]. Date of Service: 03/13/2019 2:15 PM Medical Record Number: [id number] Patient Account Number: [id number] Date of Birth/Sex: Oct 29, 1946 (73 y.o. M) Treating RN: Cornell Barman Primary Care Suraya Vidrine: Glendon Axe Other Clinician: Referring Estefany Goebel: Glendon Axe Treating Tarissa Kerin/Extender: STONE III, HOYT Weeks in Treatment: 13 Wound Status Wound Number: [id number] Primary Etiology: Diabetic Wound/Ulcer of the Lower Extremity Wound Location: Left Toe Great Wound Status: Open Wounding Event: Gradually Appeared Comorbid Hypertension, Type II Diabetes Date Acquired: 11/30/2017 History: Weeks Of Treatment: 13 Clustered Wound: No Photos Wound Measurements Length: (cm) 0.3 % Reduction in Width: (cm) 0.4 % Reduction in Depth: (cm) 0.2 Epithelializati Area: (cm) 0.094 Tunneling: Volume: (cm) 0.019 Undermining: Starting Pos Ending Posit Maximum Dist Area: 85.2% Volume: 70.3% on: Large (67-100%) No Yes ition (o'clock): 9 ion (o'clock): 4 ance: (cm) 0.3 Wound Description Classification: Grade 2 Foul Odor After Wound Margin: Thickened Slough/Fibrino Exudate Amount: Small Exudate Type: Serosanguineous Exudate Color: red, brown Cleansing: No Yes Wound Bed Granulation Amount: Large (67-100%) Exposed Structure Granulation Quality: Pale Fascia Exposed: No Necrotic Amount: Small (1-33%) Fat Layer (Subcutaneous Tissue) Exposed: Yes Necrotic Quality: Adherent Slough  Tendon Exposed: No Muscle Exposed: No Joint Exposed: No [name], [name]. ([id number]) Bone Exposed: No Periwound Skin Texture Texture Color No Abnormalities Noted: No No Abnormalities Noted: No Callus: No Atrophie Blanche: No Crepitus: No Cyanosis: No Excoriation: No Ecchymosis: Yes Induration: No Erythema: No Rash: No Hemosiderin Staining:  No Scarring: No Mottled: No Pallor: No Moisture Rubor: No No Abnormalities Noted: No Dry / Scaly: No Temperature / Pain Maceration: No Temperature: No Abnormality Tenderness on Palpation: Yes Wound Preparation Ulcer Cleansing: Rinsed/Irrigated with Saline, Other: soap and water, Topical Anesthetic Applied: Other: lidocaine 4%, Electronic Signature(s) Signed: 03/13/2019 5:20:53 PM By: Gretta Cool, BSN, RN, CWS, Kim RN, BSN Entered By: Gretta Cool, BSN, RN, CWS, Kim on 03/13/2019 14:35:28 [name], [name] ([id number]) -------------------------------------------------------------------------------- Vitals Details Patient Name: [name] Date of Service: 03/13/2019 2:15 PM Medical Record Number: [id number] Patient Account Number: [id number] Date of Birth/Sex: Dec 14, 1946 (73 y.o. M) Treating RN: Cornell Barman Primary Care Charnika Herbst: Glendon Axe Other Clinician: Referring Doralene Glanz: Glendon Axe Treating Daanish Copes/Extender: STONE III, HOYT Weeks in Treatment: 13 Vital Signs Time Taken: 14:25 Temperature (F): 98.4 Height (in): 72 Pulse (bpm): 72 Weight (lbs): 250 Respiratory Rate (breaths/min): 16 Body Mass Index (BMI): 33.9 Blood Pressure (mmHg): 127/57 Reference Range: 80 - 120 mg / dl Electronic Signature(s) Signed: 03/13/2019 5:20:53 PM By: Gretta Cool, BSN, RN, CWS, Kim RN, BSN Entered By: Gretta Cool, BSN, RN, CWS, Kim on 03/13/2019 14:26:16

## 2019-03-15 NOTE — Progress Notes (Signed)
[name] ([id number]) Visit Report for 03/13/2019 Chief Complaint Document Details Patient Name: [name], [name]. Date of Service: 03/13/2019 2:15 PM Medical Record Number: [id number] Patient Account Number: [id number] Date of Birth/Sex: 05-09-1946 (73 y.o. M) Treating RN: Harold Barban Primary Care Provider: Glendon Axe Other Clinician: Referring Provider: Glendon Axe Treating Provider/Extender: Melburn Hake, Azlan Hanway Weeks in Treatment: 13 Information Obtained from: Patient Chief Complaint Left great toe ulcer Electronic Signature(s) Signed: 03/14/2019 2:10:29 PM By: Worthy Keeler PA-C Entered By: Worthy Keeler on 03/13/2019 15:35:00 [place], [name] ([id number]) -------------------------------------------------------------------------------- Debridement Details Patient Name: [name]. Date of Service: 03/13/2019 2:15 PM Medical Record Number: [id number] Patient Account Number: [id number] Date of Birth/Sex: 12/30/1945 (73 y.o. M) Treating RN: Harold Barban Primary Care Provider: Glendon Axe Other Clinician: Referring Provider: Glendon Axe Treating Provider/Extender: STONE III, Keelin Neville Weeks in Treatment: 13 Debridement Performed for Wound #1R Left Toe Great Assessment: Performed By: Physician STONE III, Lino Wickliff E., PA-C Debridement Type: Debridement Severity of Tissue Pre Fat layer exposed Debridement: Level of Consciousness (Pre- Awake and Alert procedure): Pre-procedure Verification/Time Yes - 15:47 Out Taken: Start Time: 15:47 Pain Control: Lidocaine Total Area Debrided (L x W): 0.3 (cm) x 0.4 (cm) = 0.12 (cm) Tissue and other material Non-Viable, Callus, Slough, Subcutaneous, Slough debrided: Level: Skin/Subcutaneous Tissue Debridement Description: Excisional Instrument: Curette Bleeding: None End Time: 15:50 Procedural Pain: 0 Post Procedural Pain: 0 Response to Treatment: Procedure was tolerated well Level of  Consciousness Awake and Alert (Post-procedure): Post Debridement Measurements of Total Wound Length: (cm) 0.3 Width: (cm) 0.4 Depth: (cm) 0.2 Volume: (cm) 0.019 Character of Wound/Ulcer Post Debridement: Improved Severity of Tissue Post Debridement: Fat layer exposed Post Procedure Diagnosis Same as Pre-procedure Electronic Signature(s) Signed: 03/13/2019 5:03:00 PM By: Harold Barban Signed: 03/14/2019 2:10:29 PM By: Worthy Keeler PA-C Entered By: Harold Barban on 03/13/2019 15:48:44 [place], [name] ([id number]) -------------------------------------------------------------------------------- HPI Details Patient Name: [name] Date of Service: 03/13/2019 2:15 PM Medical Record Number: [id number] Patient Account Number: [id number] Date of Birth/Sex: December 20, 1946 (73 y.o. M) Treating RN: Harold Barban Primary Care Provider: Glendon Axe Other Clinician: Referring Provider: Glendon Axe Treating Provider/Extender: Melburn Hake, Bladyn Tipps Weeks in Treatment: 13 History of Present Illness HPI Description: 12/12/18 on evaluation today patient presents as a referral from his endocrinologist regarding issues that he has been having with his left great toe. Subsequently he has been seeing Dr. Cleda Mccreedy and Dr. Cleda Mccreedy has been the breeding the wound and in fact this was debrided this morning. The patient had an appointment there prior to coming here. Subsequently he states that even though he's been going for regular debridement after office things really do not seem to be showing signs of improvement unfortunately. He states that he is having some discomfort but nothing too significant. No fevers, chills, nausea, or vomiting noted at this time. The patient does have a history of hypertension, neuropathy, diabetes mellitus type II, and a history of stroke. Currently he's been using bacitracin on the toe and utilizing a Band-Aid. He tells me he's had this wound for two years.  He cannot remember the last time he had an x-ray. 12/19/18 on evaluation today patient appears to be doing well in regard to his plantar toe ulcer. He's been tolerating the dressing changes without complication. Fortunately there does not appear to be signs of infection at this time which is good news. No fever chills noted. 01/19/19 has been one month since I last saw the patient as he was out  of town. With that being said on evaluation today it does appear that he is going to require sharp debridement and I do believe that the total contact cast would benefit him as far as being applied at this time. He is in agreement that plan. 01/23/19 on evaluation today patient appears to be doing better in regard to his left great toe ulcer. He has been shown signs of improvement week by week and although this is slow it does seem to be doing fairly well which is good news. Overall very pleased in this regard. 01/31/19 on evaluation today patient presents for follow-up concerning his great toe ulcer. He was actually supposed to be here yesterday and he did come however he ended up having to leave due to his blood sugar dropping he states he just did not eat enough lunch. Fortunately seems to be doing much better today which is excellent news. Fortunately there's no evidence of infection at this time which is also good news. No fevers, chills, nausea, or vomiting noted at this time. Overall I feel like he is making excellent progress. 02/06/19 on evaluation today patient actually appears to be doing very well in regard to his plantar toe ulcer. He's been tolerating the dressing changes without complication. Fortunately there is no sign of infection at this time. Overall been very pleased with how things seem to be progressing. No fevers, chills, nausea, or vomiting noted at this time. 02/13/19 on evaluation today patient actually appears to be doing excellent in regard to his toe ulcer which is almost completely  close. Overall very pleased with that. There does not appear to be any signs of infection which is good news. Upon close inspection it appears that he has just a very slight opening still noted at the central portion of the toe although this is minimal. 02/16/19 on evaluation today patient is seen for early follow-up due to the fact that he tells me while at home he actually got up to go answer the door without putting on the walking boot part of his cast and subsequently damaged the total contact cast. It therefore comes in to have this removed and potentially replaced. Fortunately other than it given him a little bit of pain its far as pushing in on some areas there doesn't appear to be any injury once the cast was removed. 03/13/19 on evaluation today patient unfortunately presents for follow-up concerning his great toe ulcer on the left. He was discharged on 02/16/19 with the wound healed at that point. He tells me this remain so for several weeks or least a couple weeks before reopening. Fortunately there does not appear to be any signs of infection at this time. Nonetheless for the past week at least he's had the wound reopened and states it is been trying to keep pressure off of this but he has not been using the offloading shoe we previously gave him. Fortunately there's no evidence of infection at this time. No fevers, chills, nausea, or vomiting noted at this time. JAELYNN, CURRIER ([id number]) Electronic Signature(s) Signed: 03/14/2019 2:10:29 PM By: Worthy Keeler PA-C Entered By: Worthy Keeler on 03/13/2019 23:41:12 [place], [name] ([id number]) -------------------------------------------------------------------------------- Physical Exam Details Patient Name: YVON, MCCORD Date of Service: 03/13/2019 2:15 PM Medical Record Number: [id number] Patient Account Number: [id number] Date of Birth/Sex: 1946-12-10 (73 y.o. M) Treating RN: Harold Barban Primary Care  Provider: Glendon Axe Other Clinician: Referring Provider: Glendon Axe Treating Provider/Extender: STONE III, Affie Gasner Weeks in Treatment:  68 Constitutional Well-nourished and well-hydrated in no acute distress. Respiratory normal breathing without difficulty. Psychiatric this patient is able to make decisions and demonstrates good insight into disease process. Alert and Oriented x 3. pleasant and cooperative. Notes At this point today patient's wound did require sharp debridement. He tolerated this without complication. Post debridement the wound bed actually appears to be doing much better which is good news. He did have mainly callous surrounding the was some Slough in the surface of the wound and all this was cleared away without complication. Electronic Signature(s) Signed: 03/14/2019 2:10:29 PM By: Worthy Keeler PA-C Entered By: Worthy Keeler on 03/13/2019 23:42:35 [place], [name] ([id number]) -------------------------------------------------------------------------------- Physician Orders Details Patient Name: [name] Date of Service: 03/13/2019 2:15 PM Medical Record Number: [id number] Patient Account Number: [id number] Date of Birth/Sex: January 10, 1946 (73 y.o. M) Treating RN: Harold Barban Primary Care Provider: Glendon Axe Other Clinician: Referring Provider: Glendon Axe Treating Provider/Extender: Melburn Hake, Lateef Juncaj Weeks in Treatment: 13 Verbal / Phone Orders: No Diagnosis Coding ICD-10 Coding Code Description E11.621 Type 2 diabetes mellitus with foot ulcer L97.522 Non-pressure chronic ulcer of other part of left foot with fat layer exposed I10 Essential (primary) hypertension E11.43 Type 2 diabetes mellitus with diabetic autonomic (poly)neuropathy Primary Wound Dressing o Silver Collagen Secondary Dressing Wound #1R Left Toe Great o Coban Dressing Change Frequency Wound #1R Left Toe Great o Change dressing every other  day. Follow-up Appointments Wound #1R Left Toe Great o Other: - Return visit Friday, March 20th. Bring walking boot Off-Loading o Open toe surgical shoe to: - left foot, felt surrounding wound Discharge From [facility] o Discharge from [facility] - treatment complete Electronic Signature(s) Signed: 03/13/2019 5:03:00 PM By: Harold Barban Signed: 03/14/2019 2:10:29 PM By: Worthy Keeler PA-C Entered By: Harold Barban on 03/13/2019 15:56:44 [place], [name] ([id number]) -------------------------------------------------------------------------------- Problem List Details Patient Name: ANDRY, BOGDEN. Date of Service: 03/13/2019 2:15 PM Medical Record Number: [id number] Patient Account Number: [id number] Date of Birth/Sex: January 25, 1946 (73 y.o. M) Treating RN: Harold Barban Primary Care Provider: Glendon Axe Other Clinician: Referring Provider: Glendon Axe Treating Provider/Extender: Melburn Hake, Marcia Lepera Weeks in Treatment: 13 Active Problems ICD-10 Evaluated Encounter Code Description Active Date Today Diagnosis E11.621 Type 2 diabetes mellitus with foot ulcer 12/12/2018 No Yes L97.522 Non-pressure chronic ulcer of other part of left foot with fat 12/12/2018 No Yes layer exposed I10 Essential (primary) hypertension 12/12/2018 No Yes E11.43 Type 2 diabetes mellitus with diabetic autonomic (poly) 12/12/2018 No Yes neuropathy Inactive Problems Resolved Problems Electronic Signature(s) Signed: 03/14/2019 2:10:29 PM By: Worthy Keeler PA-C Entered By: Worthy Keeler on 03/13/2019 15:34:55 [place], [name] ([id number]) -------------------------------------------------------------------------------- Progress Note Details Patient Name: [name] Date of Service: 03/13/2019 2:15 PM Medical Record Number: [id number] Patient Account Number: [id number] Date of Birth/Sex: 1946/06/16 (73 y.o. M) Treating RN: Harold Barban Primary Care  Provider: Glendon Axe Other Clinician: Referring Provider: Glendon Axe Treating Provider/Extender: Melburn Hake, Davionte Lusby Weeks in Treatment: 13 Subjective Chief Complaint Information obtained from Patient Left great toe ulcer History of Present Illness (HPI) 12/12/18 on evaluation today patient presents as a referral from his endocrinologist regarding issues that he has been having with his left great toe. Subsequently he has been seeing Dr. Cleda Mccreedy and Dr. Cleda Mccreedy has been the breeding the wound and in fact this was debrided this morning. The patient had an appointment there prior to coming here. Subsequently he states that even though he's been going for regular debridement  after office things really do not seem to be showing signs of improvement unfortunately. He states that he is having some discomfort but nothing too significant. No fevers, chills, nausea, or vomiting noted at this time. The patient does have a history of hypertension, neuropathy, diabetes mellitus type II, and a history of stroke. Currently he's been using bacitracin on the toe and utilizing a Band-Aid. He tells me he's had this wound for two years. He cannot remember the last time he had an x-ray. 12/19/18 on evaluation today patient appears to be doing well in regard to his plantar toe ulcer. He's been tolerating the dressing changes without complication. Fortunately there does not appear to be signs of infection at this time which is good news. No fever chills noted. 01/19/19 has been one month since I last saw the patient as he was out of town. With that being said on evaluation today it does appear that he is going to require sharp debridement and I do believe that the total contact cast would benefit him as far as being applied at this time. He is in agreement that plan. 01/23/19 on evaluation today patient appears to be doing better in regard to his left great toe ulcer. He has been shown signs of improvement week by  week and although this is slow it does seem to be doing fairly well which is good news. Overall very pleased in this regard. 01/31/19 on evaluation today patient presents for follow-up concerning his great toe ulcer. He was actually supposed to be here yesterday and he did come however he ended up having to leave due to his blood sugar dropping he states he just did not eat enough lunch. Fortunately seems to be doing much better today which is excellent news. Fortunately there's no evidence of infection at this time which is also good news. No fevers, chills, nausea, or vomiting noted at this time. Overall I feel like he is making excellent progress. 02/06/19 on evaluation today patient actually appears to be doing very well in regard to his plantar toe ulcer. He's been tolerating the dressing changes without complication. Fortunately there is no sign of infection at this time. Overall been very pleased with how things seem to be progressing. No fevers, chills, nausea, or vomiting noted at this time. 02/13/19 on evaluation today patient actually appears to be doing excellent in regard to his toe ulcer which is almost completely close. Overall very pleased with that. There does not appear to be any signs of infection which is good news. Upon close inspection it appears that he has just a very slight opening still noted at the central portion of the toe although this is minimal. 02/16/19 on evaluation today patient is seen for early follow-up due to the fact that he tells me while at home he actually got up to go answer the door without putting on the walking boot part of his cast and subsequently damaged the total contact cast. It therefore comes in to have this removed and potentially replaced. Fortunately other than it given him a little bit of pain its far as pushing in on some areas there doesn't appear to be any injury once the cast was removed. LONZIE, SIMMER ([id number]) 03/13/19 on  evaluation today patient unfortunately presents for follow-up concerning his great toe ulcer on the left. He was discharged on 02/16/19 with the wound healed at that point. He tells me this remain so for several weeks or least a couple weeks before  reopening. Fortunately there does not appear to be any signs of infection at this time. Nonetheless for the past week at least he's had the wound reopened and states it is been trying to keep pressure off of this but he has not been using the offloading shoe we previously gave him. Fortunately there's no evidence of infection at this time. No fevers, chills, nausea, or vomiting noted at this time. Patient History Information obtained from Patient. Family History Diabetes - Mother, Heart Disease - Father, Stroke - Father, No family history of Cancer, Hereditary Spherocytosis, Hypertension, Kidney Disease, Lung Disease, Seizures, Thyroid Problems, Tuberculosis. Social History Never smoker, Marital Status - Widowed, Alcohol Use - Never, Drug Use - No History, Caffeine Use - Moderate. Medical History Eyes Denies history of Cataracts, Glaucoma, Optic Neuritis Ear/Nose/Mouth/Throat Denies history of Chronic sinus problems/congestion, Middle ear problems Hematologic/Lymphatic Denies history of Anemia, Hemophilia, Human Immunodeficiency Virus, Lymphedema Respiratory Denies history of Aspiration, Asthma, Chronic Obstructive Pulmonary Disease (COPD), Pneumothorax, Sleep Apnea, Tuberculosis Cardiovascular Patient has history of Hypertension Denies history of Angina, Arrhythmia, Congestive Heart Failure, Coronary Artery Disease, Deep Vein Thrombosis, Hypotension, Myocardial Infarction, Peripheral Arterial Disease, Peripheral Venous Disease, Phlebitis, Vasculitis Gastrointestinal Denies history of Cirrhosis , Colitis, Crohn s, Hepatitis A, Hepatitis B, Hepatitis C Endocrine Patient has history of Type II Diabetes Denies history of Type I  Diabetes Genitourinary Denies history of End Stage Renal Disease Immunological Denies history of Lupus Erythematosus, Raynaud s, Scleroderma Integumentary (Skin) Denies history of History of Burn, History of pressure wounds Musculoskeletal Denies history of Gout, Rheumatoid Arthritis, Osteoarthritis, Osteomyelitis Neurologic Denies history of Dementia, Neuropathy, Quadriplegia, Paraplegia, Seizure Disorder Oncologic Denies history of Received Chemotherapy, Received Radiation Review of Systems (ROS) Constitutional Symptoms (General Health) Denies complaints or symptoms of Fever, Chills. Respiratory The patient has no complaints or symptoms. Cardiovascular The patient has no complaints or symptoms. Psychiatric The patient has no complaints or symptoms. DEQUON, SCHNEBLY ([id number]) Objective Constitutional Well-nourished and well-hydrated in no acute distress. Vitals Time Taken: 2:25 PM, Height: 72 in, Weight: 250 lbs, BMI: 33.9, Temperature: 98.4 F, Pulse: 72 bpm, Respiratory Rate: 16 breaths/min, Blood Pressure: 127/57 mmHg. Respiratory normal breathing without difficulty. Psychiatric this patient is able to make decisions and demonstrates good insight into disease process. Alert and Oriented x 3. pleasant and cooperative. General Notes: At this point today patient's wound did require sharp debridement. He tolerated this without complication. Post debridement the wound bed actually appears to be doing much better which is good news. He did have mainly callous surrounding the was some Slough in the surface of the wound and all this was cleared away without complication. Integumentary (Hair, Skin) Wound #1R status is Open. Original cause of wound was Gradually Appeared. The wound is located on the Left Toe Great. The wound measures 0.3cm length x 0.4cm width x 0.2cm depth; 0.094cm^2 area and 0.019cm^3 volume. There is Fat Layer (Subcutaneous Tissue) Exposed exposed. There  is no tunneling noted, however, there is undermining starting at 9:00 and ending at 4:00 with a maximum distance of 0.3cm. There is a small amount of serosanguineous drainage noted. The wound margin is thickened. There is large (67-100%) pale granulation within the wound bed. There is a small (1-33%) amount of necrotic tissue within the wound bed including Adherent Slough. The periwound skin appearance exhibited: Ecchymosis. The periwound skin appearance did not exhibit: Callus, Crepitus, Excoriation, Induration, Rash, Scarring, Dry/Scaly, Maceration, Atrophie Blanche, Cyanosis, Hemosiderin Staining, Mottled, Pallor, Rubor, Erythema. Periwound temperature was noted as No Abnormality.  The periwound has tenderness on palpation. Assessment Active Problems ICD-10 Type 2 diabetes mellitus with foot ulcer Non-pressure chronic ulcer of other part of left foot with fat layer exposed Essential (primary) hypertension Type 2 diabetes mellitus with diabetic autonomic (poly)neuropathy AMIER, Kamia Insalaco ([id number]) Procedures Wound #1R Pre-procedure diagnosis of Wound #1R is a Diabetic Wound/Ulcer of the Lower Extremity located on the Left Toe Great .Severity of Tissue Pre Debridement is: Fat layer exposed. There was a Excisional Skin/Subcutaneous Tissue Debridement with a total area of 0.12 sq cm performed by STONE III, Horatio Bertz E., PA-C. With the following instrument(s): Curette to remove Non-Viable tissue/material. Material removed includes Callus, Subcutaneous Tissue, and Slough after achieving pain control using Lidocaine. No specimens were taken. A time out was conducted at 15:47, prior to the start of the procedure. There was no bleeding. The procedure was tolerated well with a pain level of 0 throughout and a pain level of 0 following the procedure. Post Debridement Measurements: 0.3cm length x 0.4cm width x 0.2cm depth; 0.019cm^3 volume. Character of Wound/Ulcer Post Debridement is improved.  Severity of Tissue Post Debridement is: Fat layer exposed. Post procedure Diagnosis Wound #1R: Same as Pre-Procedure Plan Primary Wound Dressing: Silver Collagen Secondary Dressing: Wound #1R Left Toe Great: Coban Dressing Change Frequency: Wound #1R Left Toe Great: Change dressing every other day. Follow-up Appointments: Wound #1R Left Toe Great: Other: - Return visit Friday, March 20th. Bring walking boot Off-Loading: Open toe surgical shoe to: - left foot, felt surrounding wound Discharge From [facility]: Discharge from [facility] - treatment complete My suggestion currently is gonna be that we go ahead and initiate the above wound care measures for the next week the patient is in agreement the plan. Subsequently will see were things stand at follow-up. He will actually be coming back on Friday at which point will apply the total contact cast. Hopefully this will get this area to heal more effectively and quickly as it did previous. He also tells me he has gotten his diabetic shoes finally from the [facility] hospital and he is going to go pick those up as well so he'll have those in the next week or so. If anything changes worsens will contact the office and let me know. Please see above for specific wound care orders. We will see patient for re-evaluation in 1 week(s) here in the clinic. If anything worsens or changes patient will contact our office for additional recommendations. Electronic Signature(s) Signed: 03/14/2019 2:10:29 PM By: Worthy Keeler PA-C Entered By: Worthy Keeler on 03/13/2019 23:43:24 [name] ([id number]) -------------------------------------------------------------------------------- ROS/PFSH Details Patient Name: [name] Date of Service: 03/13/2019 2:15 PM Medical Record Number: [id number] Patient Account Number: [id number] Date of Birth/Sex: 08-17-1946 (73 y.o. M) Treating RN: Harold Barban Primary Care Provider: Glendon Axe Other Clinician: Referring Provider: Glendon Axe Treating Provider/Extender: STONE III, Babe Clenney Weeks in Treatment: 13 Information Obtained From Patient Wound History Do you currently have one or more open woundso No Have you tested positive for osteomyelitis (bone infection)o No Have you had any tests for circulation on your legso No Constitutional Symptoms (General Health) Complaints and Symptoms: Negative for: Fever; Chills Eyes Medical History: Negative for: Cataracts; Glaucoma; Optic Neuritis Ear/Nose/Mouth/Throat Medical History: Negative for: Chronic sinus problems/congestion; Middle ear problems Hematologic/Lymphatic Medical History: Negative for: Anemia; Hemophilia; Human Immunodeficiency Virus; Lymphedema Respiratory Complaints and Symptoms: No Complaints or Symptoms Medical History: Negative for: Aspiration; Asthma; Chronic Obstructive Pulmonary Disease (COPD); Pneumothorax; Sleep Apnea; Tuberculosis Cardiovascular  Complaints and Symptoms: No Complaints or Symptoms Medical History: Positive for: Hypertension Negative for: Angina; Arrhythmia; Congestive Heart Failure; Coronary Artery Disease; Deep Vein Thrombosis; Hypotension; Myocardial Infarction; Peripheral Arterial Disease; Peripheral Venous Disease; Phlebitis; Vasculitis Gastrointestinal NIDAL, RIVET ([id number]) Medical History: Negative for: Cirrhosis ; Colitis; Crohnos; Hepatitis A; Hepatitis B; Hepatitis C Endocrine Medical History: Positive for: Type II Diabetes Negative for: Type I Diabetes Time with diabetes: 1990 Treated with: Insulin Blood sugar tested every day: Yes Tested : Genitourinary Medical History: Negative for: End Stage Renal Disease Immunological Medical History: Negative for: Lupus Erythematosus; Raynaudos; Scleroderma Integumentary (Skin) Medical History: Negative for: History of Burn; History of pressure wounds Musculoskeletal Medical History: Negative  for: Gout; Rheumatoid Arthritis; Osteoarthritis; Osteomyelitis Neurologic Medical History: Negative for: Dementia; Neuropathy; Quadriplegia; Paraplegia; Seizure Disorder Oncologic Medical History: Negative for: Received Chemotherapy; Received Radiation Psychiatric Complaints and Symptoms: No Complaints or Symptoms Immunizations Pneumococcal Vaccine: Received Pneumococcal Vaccination: Yes Implantable Devices No devices added Family and Social History Cancer: No; Diabetes: Yes - Mother; Heart Disease: Yes - Father; Hereditary Spherocytosis: No; Hypertension: No; Kidney Disease: No; Lung Disease: No; Seizures: No; Stroke: Yes - Father; Thyroid Problems: No; Tuberculosis: No; Never smoker; Marital Status - Widowed; Alcohol Use: Never; Drug Use: No History; Caffeine Use: Moderate; Financial Concerns: No; TRESTIN, VENCES ([id number]) Food, Clothing or Shelter Needs: No; Support System Lacking: No; Transportation Concerns: No; Advanced Directives: Yes (Not Provided); Patient does not want information on Advanced Directives; Do not resuscitate: Yes (Not Provided); Living Will: Yes (Not Provided); Medical Power of Attorney: Yes (Not Provided) Physician Affirmation I have reviewed and agree with the above information. Electronic Signature(s) Signed: 03/14/2019 2:10:29 PM By: Worthy Keeler PA-C Signed: 03/14/2019 4:48:10 PM By: Harold Barban Entered By: Worthy Keeler on 03/13/2019 23:41:31 [place], [name] ([id number]) -------------------------------------------------------------------------------- SuperBill Details Patient Name: [name] Date of Service: 03/13/2019 Medical Record Number: [id number] Patient Account Number: [id number] Date of Birth/Sex: 1946/02/25 (73 y.o. 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

## 2019-03-17 ENCOUNTER — Encounter: Payer: Medicare Other | Admitting: Physician Assistant

## 2019-03-17 ENCOUNTER — Other Ambulatory Visit: Payer: Self-pay

## 2019-03-17 DIAGNOSIS — E11621 Type 2 diabetes mellitus with foot ulcer: Secondary | ICD-10-CM | POA: Diagnosis not present

## 2019-03-18 NOTE — Progress Notes (Signed)
[name] ([id number]) Visit Report for 03/17/2019 Chief Complaint Document Details Patient Name: [name], [name]. Date of Service: 03/17/2019 10:00 AM Medical Record Number: [id number] Patient Account Number: [id number] Date of Birth/Sex: 06/11/1946 (73 y.o. M) Treating RN: Montey Hora Primary Care Provider: Glendon Axe Other Clinician: Referring Provider: Glendon Axe Treating Provider/Extender: STONE III, Taijon Vink Weeks in Treatment: 13 Information Obtained from: Patient Chief Complaint Left great toe ulcer Electronic Signature(s) Signed: 03/17/2019 11:59:00 AM By: Worthy Keeler PA-C Entered By: Worthy Keeler on 03/17/2019 09:56:42 [name], [name] ([id number]) -------------------------------------------------------------------------------- HPI Details Patient Name: [name] Date of Service: 03/17/2019 10:00 AM Medical Record Number: [id number] Patient Account Number: [id number] Date of Birth/Sex: 07-12-1946 (73 y.o. M) Treating RN: Montey Hora Primary Care Provider: Glendon Axe Other Clinician: Referring Provider: Glendon Axe Treating Provider/Extender: Melburn Hake, [place] Surges Weeks in Treatment: 13 History of Present Illness HPI Description: 12/12/18 on evaluation today patient presents as a referral from his endocrinologist regarding issues that he has been having with his left great toe. Subsequently he has been seeing Dr. Cleda Mccreedy and Dr. Cleda Mccreedy has been the breeding the wound and in fact this was debrided this morning. The patient had an appointment there prior to coming here. Subsequently he states that even though he's been going for regular debridement after office things really do not seem to be showing signs of improvement unfortunately. He states that he is having some discomfort but nothing too significant. No fevers, chills, nausea, or vomiting noted at this time. The patient does have a history of hypertension, neuropathy,  diabetes mellitus type II, and a history of stroke. Currently he's been using bacitracin on the toe and utilizing a Band-Aid. He tells me he's had this wound for two years. He cannot remember the last time he had an x-ray. 12/19/18 on evaluation today patient appears to be doing well in regard to his plantar toe ulcer. He's been tolerating the dressing changes without complication. Fortunately there does not appear to be signs of infection at this time which is good news. No fever chills noted. 01/19/19 has been one month since I last saw the patient as he was out of town. With that being said on evaluation today it does appear that he is going to require sharp debridement and I do believe that the total contact cast would benefit him as far as being applied at this time. He is in agreement that plan. 01/23/19 on evaluation today patient appears to be doing better in regard to his left great toe ulcer. He has been shown signs of improvement week by week and although this is slow it does seem to be doing fairly well which is good news. Overall very pleased in this regard. 01/31/19 on evaluation today patient presents for follow-up concerning his great toe ulcer. He was actually supposed to be here yesterday and he did come however he ended up having to leave due to his blood sugar dropping he states he just did not eat enough lunch. Fortunately seems to be doing much better today which is excellent news. Fortunately there's no evidence of infection at this time which is also good news. No fevers, chills, nausea, or vomiting noted at this time. Overall I feel like he is making excellent progress. 02/06/19 on evaluation today patient actually appears to be doing very well in regard to his plantar toe ulcer. He's been tolerating the dressing changes without complication. Fortunately there is no sign of infection at this time. Overall been  very pleased with how things seem to be progressing. No fevers,  chills, nausea, or vomiting noted at this time. 02/13/19 on evaluation today patient actually appears to be doing excellent in regard to his toe ulcer which is almost completely close. Overall very pleased with that. There does not appear to be any signs of infection which is good news. Upon close inspection it appears that he has just a very slight opening still noted at the central portion of the toe although this is minimal. 02/16/19 on evaluation today patient is seen for early follow-up due to the fact that he tells me while at home he actually got up to go answer the door without putting on the walking boot part of his cast and subsequently damaged the total contact cast. It therefore comes in to have this removed and potentially replaced. Fortunately other than it given him a little bit of pain its far as pushing in on some areas there doesn't appear to be any injury once the cast was removed. 03/13/19 on evaluation today patient unfortunately presents for follow-up concerning his great toe ulcer on the left. He was discharged on 02/16/19 with the wound healed at that point. He tells me this remain so for several weeks or least a couple weeks before reopening. Fortunately there does not appear to be any signs of infection at this time. Nonetheless for the past week at least he's had the wound reopened and states it is been trying to keep pressure off of this but he has not been using the offloading shoe we previously gave him. Fortunately there's no evidence of infection at this time. No fevers, chills, nausea, or vomiting noted at this time. [name] ([id number]) 03/17/19 on evaluation today patient actually appears to be doing rather well in regard to his toe also. Fact this appears to be significantly smaller this week even compared to what I saw last week on evaluation. He seems to have done very well for the offloading shoe and overall I'm very pleased with the progress that is  made. It may be that he doesn't even need the cast to get this to heal if he offloads this appropriately. Electronic Signature(s) Signed: 03/17/2019 11:59:00 AM By: Worthy Keeler PA-C Entered By: Worthy Keeler on 03/17/2019 10:38:33 [place], [name] ([id number]) -------------------------------------------------------------------------------- Physical Exam Details Patient Name: [name] Date of Service: 03/17/2019 10:00 AM Medical Record Number: [id number] Patient Account Number: [id number] Date of Birth/Sex: 12-18-1946 (73 y.o. M) Treating RN: Montey Hora Primary Care Provider: Glendon Axe Other Clinician: Referring Provider: Glendon Axe Treating Provider/Extender: STONE III, Kmari Halter Weeks in Treatment: 45 Constitutional Well-nourished and well-hydrated in no acute distress. Respiratory normal breathing without difficulty. clear to auscultation bilaterally. Cardiovascular regular rate and rhythm with normal S1, S2. Psychiatric this patient is able to make decisions and demonstrates good insight into disease process. Alert and Oriented x 3. pleasant and cooperative. Notes Patient's wound bed currently shows signs of good epithelialization progress at this point. He has been wearing the offloading shoe this seems to have done very well for him. I see no need for sharp debridement today. Electronic Signature(s) Signed: 03/17/2019 11:59:00 AM By: Worthy Keeler PA-C Entered By: Worthy Keeler on 03/17/2019 10:39:44 [name], [name] ([id number]) -------------------------------------------------------------------------------- Physician Orders Details Patient Name: [name] Date of Service: 03/17/2019 10:00 AM Medical Record Number: [id number] Patient Account Number: [id number] Date of Birth/Sex: November 07, 1946 (73 y.o. M) Treating RN: Montey Hora Primary Care Provider: [facility],  JASMINE Other Clinician: Referring Provider: Glendon Axe Treating Provider/Extender: STONE III, Matix Henshaw Weeks in Treatment: 13 Verbal / Phone Orders: No Diagnosis Coding ICD-10 Coding Code Description E11.621 Type 2 diabetes mellitus with foot ulcer L97.522 Non-pressure chronic ulcer of other part of left foot with fat layer exposed I10 Essential (primary) hypertension E11.43 Type 2 diabetes mellitus with diabetic autonomic (poly)neuropathy Wound Cleansing Wound #1R Left Toe Great o Clean wound with Normal Saline. o May Shower, gently pat wound dry prior to applying new dressing. Primary Wound Dressing Wound #1R Left Toe Great o Silver Collagen Secondary Dressing Wound #1R Left Toe Great o Conform/Kerlix o Foam Dressing Change Frequency Wound #1R Left Toe Great o Change dressing every other day. Follow-up Appointments Wound #1R Left Toe Great o Return Appointment in 1 week. Off-Loading Wound #1R Left Toe Great o Open toe surgical shoe to: - left foot, felt surrounding wound Electronic Signature(s) Signed: 03/17/2019 11:59:00 AM By: Worthy Keeler PA-C Signed: 03/17/2019 3:50:35 PM By: Montey Hora Entered By: Montey Hora on 03/17/2019 10:35:05 [name] ([id number]) -------------------------------------------------------------------------------- Problem List Details Patient Name: [name], [name]. Date of Service: 03/17/2019 10:00 AM Medical Record Number: [id number] Patient Account Number: [id number] Date of Birth/Sex: 05-19-1946 (73 y.o. M) Treating RN: Montey Hora Primary Care Provider: Glendon Axe Other Clinician: Referring Provider: Glendon Axe Treating Provider/Extender: Melburn Hake, Mordche Hedglin Weeks in Treatment: 13 Active Problems ICD-10 Evaluated Encounter Code Description Active Date Today Diagnosis E11.621 Type 2 diabetes mellitus with foot ulcer 12/12/2018 No Yes L97.522 Non-pressure chronic ulcer of other part of left foot with fat 12/12/2018 No Yes layer  exposed I10 Essential (primary) hypertension 12/12/2018 No Yes E11.43 Type 2 diabetes mellitus with diabetic autonomic (poly) 12/12/2018 No Yes neuropathy Inactive Problems Resolved Problems Electronic Signature(s) Signed: 03/17/2019 11:59:00 AM By: Worthy Keeler PA-C Entered By: Worthy Keeler on 03/17/2019 09:56:41 [place], [name] ([id number]) -------------------------------------------------------------------------------- Progress Note Details Patient Name: [name] Date of Service: 03/17/2019 10:00 AM Medical Record Number: [id number] Patient Account Number: [id number] Date of Birth/Sex: 06-Aug-1946 (73 y.o. M) Treating RN: Montey Hora Primary Care Provider: Glendon Axe Other Clinician: Referring Provider: Glendon Axe Treating Provider/Extender: Melburn Hake, Lakeeta Dobosz Weeks in Treatment: 13 Subjective Chief Complaint Information obtained from Patient Left great toe ulcer History of Present Illness (HPI) 12/12/18 on evaluation today patient presents as a referral from his endocrinologist regarding issues that he has been having with his left great toe. Subsequently he has been seeing Dr. Cleda Mccreedy and Dr. Cleda Mccreedy has been the breeding the wound and in fact this was debrided this morning. The patient had an appointment there prior to coming here. Subsequently he states that even though he's been going for regular debridement after office things really do not seem to be showing signs of improvement unfortunately. He states that he is having some discomfort but nothing too significant. No fevers, chills, nausea, or vomiting noted at this time. The patient does have a history of hypertension, neuropathy, diabetes mellitus type II, and a history of stroke. Currently he's been using bacitracin on the toe and utilizing a Band-Aid. He tells me he's had this wound for two years. He cannot remember the last time he had an x-ray. 12/19/18 on evaluation today patient appears  to be doing well in regard to his plantar toe ulcer. He's been tolerating the dressing changes without complication. Fortunately there does not appear to be signs of infection at this time which is good news. No fever chills noted. 01/19/19 has  been one month since I last saw the patient as he was out of town. With that being said on evaluation today it does appear that he is going to require sharp debridement and I do believe that the total contact cast would benefit him as far as being applied at this time. He is in agreement that plan. 01/23/19 on evaluation today patient appears to be doing better in regard to his left great toe ulcer. He has been shown signs of improvement week by week and although this is slow it does seem to be doing fairly well which is good news. Overall very pleased in this regard. 01/31/19 on evaluation today patient presents for follow-up concerning his great toe ulcer. He was actually supposed to be here yesterday and he did come however he ended up having to leave due to his blood sugar dropping he states he just did not eat enough lunch. Fortunately seems to be doing much better today which is excellent news. Fortunately there's no evidence of infection at this time which is also good news. No fevers, chills, nausea, or vomiting noted at this time. Overall I feel like he is making excellent progress. 02/06/19 on evaluation today patient actually appears to be doing very well in regard to his plantar toe ulcer. He's been tolerating the dressing changes without complication. Fortunately there is no sign of infection at this time. Overall been very pleased with how things seem to be progressing. No fevers, chills, nausea, or vomiting noted at this time. 02/13/19 on evaluation today patient actually appears to be doing excellent in regard to his toe ulcer which is almost completely close. Overall very pleased with that. There does not appear to be any signs of infection which  is good news. Upon close inspection it appears that he has just a very slight opening still noted at the central portion of the toe although this is minimal. 02/16/19 on evaluation today patient is seen for early follow-up due to the fact that he tells me while at home he actually got up to go answer the door without putting on the walking boot part of his cast and subsequently damaged the total contact cast. It therefore comes in to have this removed and potentially replaced. Fortunately other than it given him a little bit of pain its far as pushing in on some areas there doesn't appear to be any injury once the cast was removed. [name], [name] ([id number]) 03/13/19 on evaluation today patient unfortunately presents for follow-up concerning his great toe ulcer on the left. He was discharged on 02/16/19 with the wound healed at that point. He tells me this remain so for several weeks or least a couple weeks before reopening. Fortunately there does not appear to be any signs of infection at this time. Nonetheless for the past week at least he's had the wound reopened and states it is been trying to keep pressure off of this but he has not been using the offloading shoe we previously gave him. Fortunately there's no evidence of infection at this time. No fevers, chills, nausea, or vomiting noted at this time. 03/17/19 on evaluation today patient actually appears to be doing rather well in regard to his toe also. Fact this appears to be significantly smaller this week even compared to what I saw last week on evaluation. He seems to have done very well for the offloading shoe and overall I'm very pleased with the progress that is made. It may be that he  doesn't even need the cast to get this to heal if he offloads this appropriately. Patient History Information obtained from Patient. Family History Diabetes - Mother, Heart Disease - Father, Stroke - Father, No family history of Cancer,  Hereditary Spherocytosis, Hypertension, Kidney Disease, Lung Disease, Seizures, Thyroid Problems, Tuberculosis. Social History Never smoker, Marital Status - Widowed, Alcohol Use - Never, Drug Use - No History, Caffeine Use - Moderate. Medical History Eyes Denies history of Cataracts, Glaucoma, Optic Neuritis Ear/Nose/Mouth/Throat Denies history of Chronic sinus problems/congestion, Middle ear problems Hematologic/Lymphatic Denies history of Anemia, Hemophilia, Human Immunodeficiency Virus, Lymphedema Respiratory Denies history of Aspiration, Asthma, Chronic Obstructive Pulmonary Disease (COPD), Pneumothorax, Sleep Apnea, Tuberculosis Cardiovascular Patient has history of Hypertension Denies history of Angina, Arrhythmia, Congestive Heart Failure, Coronary Artery Disease, Deep Vein Thrombosis, Hypotension, Myocardial Infarction, Peripheral Arterial Disease, Peripheral Venous Disease, Phlebitis, Vasculitis Gastrointestinal Denies history of Cirrhosis , Colitis, Crohn s, Hepatitis A, Hepatitis B, Hepatitis C Endocrine Patient has history of Type II Diabetes Denies history of Type I Diabetes Genitourinary Denies history of End Stage Renal Disease Immunological Denies history of Lupus Erythematosus, Raynaud s, Scleroderma Integumentary (Skin) Denies history of History of Burn, History of pressure wounds Musculoskeletal Denies history of Gout, Rheumatoid Arthritis, Osteoarthritis, Osteomyelitis Neurologic Denies history of Dementia, Neuropathy, Quadriplegia, Paraplegia, Seizure Disorder Oncologic Denies history of Received Chemotherapy, Received Radiation Review of Systems (ROS) Constitutional Symptoms (General Health) Denies complaints or symptoms of Fever, Chills. Respiratory [name], [name] ([id number]) The patient has no complaints or symptoms. Cardiovascular The patient has no complaints or symptoms. Psychiatric The patient has no complaints or  symptoms. Objective Constitutional Well-nourished and well-hydrated in no acute distress. Vitals Time Taken: 10:11 AM, Height: 72 in, Weight: 250 lbs, BMI: 33.9, Temperature: 98.4 F, Pulse: 64 bpm, Respiratory Rate: 16 breaths/min, Blood Pressure: 132/64 mmHg. Respiratory normal breathing without difficulty. clear to auscultation bilaterally. Cardiovascular regular rate and rhythm with normal S1, S2. Psychiatric this patient is able to make decisions and demonstrates good insight into disease process. Alert and Oriented x 3. pleasant and cooperative. General Notes: Patient's wound bed currently shows signs of good epithelialization progress at this point. He has been wearing the offloading shoe this seems to have done very well for him. I see no need for sharp debridement today. Integumentary (Hair, Skin) Wound #1R status is Open. Original cause of wound was Gradually Appeared. The wound is located on the Left Toe Great. The wound measures 0.2cm length x 0.2cm width x 0.2cm depth; 0.031cm^2 area and 0.006cm^3 volume. There is Fat Layer (Subcutaneous Tissue) Exposed exposed. There is no tunneling or undermining noted. There is a small amount of serosanguineous drainage noted. The wound margin is thickened. There is large (67-100%) pale granulation within the wound bed. There is a small (1-33%) amount of necrotic tissue within the wound bed including Adherent Slough. The periwound skin appearance exhibited: Callus, Ecchymosis. The periwound skin appearance did not exhibit: Crepitus, Excoriation, Induration, Rash, Scarring, Dry/Scaly, Maceration, Atrophie Blanche, Cyanosis, Hemosiderin Staining, Mottled, Pallor, Rubor, Erythema. Periwound temperature was noted as No Abnormality. The periwound has tenderness on palpation. Assessment Active Problems ICD-10 Type 2 diabetes mellitus with foot ulcer Non-pressure chronic ulcer of other part of left foot with fat layer exposed Essential  (primary) hypertension Kinnaird, Yazen L. ([id number]) Type 2 diabetes mellitus with diabetic autonomic (poly)neuropathy Plan Wound Cleansing: Wound #1R Left Toe Great: Clean wound with Normal Saline. May Shower, gently pat wound dry prior to applying new dressing. Primary Wound Dressing: Wound #1R Left  Toe Great: Silver Collagen Secondary Dressing: Wound #1R Left Toe Great: Conform/Kerlix Foam Dressing Change Frequency: Wound #1R Left Toe Great: Change dressing every other day. Follow-up Appointments: Wound #1R Left Toe Great: Return Appointment in 1 week. Off-Loading: Wound #1R Left Toe Great: Open toe surgical shoe to: - left foot, felt surrounding wound My suggestion is that we go ahead and initiate the above wound care measures for the next week. He will continue offloading as he has been. If we need to initiate the cast I will but again as well as he's doing with this being half the size of was last week I think that that may not even be necessary. He's in agreement with this plan. Please see above for specific wound care orders. We will see patient for re-evaluation in 1 week(s) here in the clinic. If anything worsens or changes patient will contact our office for additional recommendations. Electronic Signature(s) Signed: 03/17/2019 11:59:00 AM By: Worthy Keeler PA-C Entered By: Worthy Keeler on 03/17/2019 10:40:14 [place], [name] ([id number]) -------------------------------------------------------------------------------- ROS/PFSH Details Patient Name: [name] Date of Service: 03/17/2019 10:00 AM Medical Record Number: [id number] Patient Account Number: [id number] Date of Birth/Sex: 09/16/1946 (73 y.o. M) Treating RN: Montey Hora Primary Care Provider: Glendon Axe Other Clinician: Referring Provider: Glendon Axe Treating Provider/Extender: STONE III, Nadja Lina Weeks in Treatment: 13 Information Obtained From Patient Wound  History Do you currently have one or more open woundso No Have you tested positive for osteomyelitis (bone infection)o No Have you had any tests for circulation on your legso No Constitutional Symptoms (General Health) Complaints and Symptoms: Negative for: Fever; Chills Eyes Medical History: Negative for: Cataracts; Glaucoma; Optic Neuritis Ear/Nose/Mouth/Throat Medical History: Negative for: Chronic sinus problems/congestion; Middle ear problems Hematologic/Lymphatic Medical History: Negative for: Anemia; Hemophilia; Human Immunodeficiency Virus; Lymphedema Respiratory Complaints and Symptoms: No Complaints or Symptoms Medical History: Negative for: Aspiration; Asthma; Chronic Obstructive Pulmonary Disease (COPD); Pneumothorax; Sleep Apnea; Tuberculosis Cardiovascular Complaints and Symptoms: No Complaints or Symptoms Medical History: Positive for: Hypertension Negative for: Angina; Arrhythmia; Congestive Heart Failure; Coronary Artery Disease; Deep Vein Thrombosis; Hypotension; Myocardial Infarction; Peripheral Arterial Disease; Peripheral Venous Disease; Phlebitis; Vasculitis Gastrointestinal [name], [name] ([id number]) Medical History: Negative for: Cirrhosis ; Colitis; Crohnos; Hepatitis A; Hepatitis B; Hepatitis C Endocrine Medical History: Positive for: Type II Diabetes Negative for: Type I Diabetes Time with diabetes: 1990 Treated with: Insulin Blood sugar tested every day: Yes Tested : Genitourinary Medical History: Negative for: End Stage Renal Disease Immunological Medical History: Negative for: Lupus Erythematosus; Raynaudos; Scleroderma Integumentary (Skin) Medical History: Negative for: History of Burn; History of pressure wounds Musculoskeletal Medical History: Negative for: Gout; Rheumatoid Arthritis; Osteoarthritis; Osteomyelitis Neurologic Medical History: Negative for: Dementia; Neuropathy; Quadriplegia; Paraplegia; Seizure  Disorder Oncologic Medical History: Negative for: Received Chemotherapy; Received Radiation Psychiatric Complaints and Symptoms: No Complaints or Symptoms Immunizations Pneumococcal Vaccine: Received Pneumococcal Vaccination: Yes Implantable Devices No devices added Family and Social History Cancer: No; Diabetes: Yes - Mother; Heart Disease: Yes - Father; Hereditary Spherocytosis: No; Hypertension: No; Kidney Disease: No; Lung Disease: No; Seizures: No; Stroke: Yes - Father; Thyroid Problems: No; Tuberculosis: No; Never smoker; Marital Status - Widowed; Alcohol Use: Never; Drug Use: No History; Caffeine Use: Moderate; Financial Concerns: No; [name], [name] ([id number]) Food, Clothing or Shelter Needs: No; Support System Lacking: No; Transportation Concerns: No; Advanced Directives: Yes (Not Provided); Patient does not want information on Advanced Directives; Do not resuscitate: Yes (Not Provided); Living Will: Yes (Not Provided); Medical Power of  Attorney: Yes (Not Provided) Physician Affirmation I have reviewed and agree with the above information. Electronic Signature(s) Signed: 03/17/2019 11:59:00 AM By: Worthy Keeler PA-C Signed: 03/17/2019 3:50:35 PM By: Montey Hora Entered By: Worthy Keeler on 03/17/2019 10:39:30 [name], [name] ([id number]) -------------------------------------------------------------------------------- SuperBill Details Patient Name: [name] Date of Service: 03/17/2019 Medical Record Number: [id number] Patient Account Number: [id number] Date of Birth/Sex: [birth year] (73 y.o. 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 Facility Procedures CPT4 Code: 26203559 Description: 99213 - WOUND CARE VISIT-LEV 3 EST PT Modifier: Quantity: 1 Physician Procedures CPT4 Code: 7416384 Description: 53646 - WC PHYS LEVEL 4 - EST PT ICD-10 Diagnosis Description 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

## 2019-03-18 NOTE — Progress Notes (Signed)
AD, GUTTMAN ([id number]) Visit Report for 03/17/2019 Arrival Information Details Patient Name: [name], [name]. Date of Service: 03/17/2019 10:00 AM Medical Record Number: [id number] Patient Account Number: [id number] Date of Birth/Sex: Jul 21, 1946 (73 y.o. M) Treating RN: Army Melia Primary Care Khai Arrona: Glendon Axe Other Clinician: Referring Krystn Dermody: Glendon Axe Treating Samaiya Awadallah/Extender: Melburn Hake, HOYT Weeks in Treatment: 13 Visit Information History Since Last Visit Added or deleted any medications: No Patient Arrived: Cane Any new allergies or adverse reactions: No Arrival Time: 10:10 Had a fall or experienced change in No Accompanied By: self activities of daily living that may affect Transfer Assistance: None risk of falls: Patient Has Alerts: Yes Signs or symptoms of abuse/neglect since last visito No Patient Alerts: DMII Hospitalized since last visit: No Implantable device outside of the clinic excluding No cellular tissue based products placed in the center since last visit: Has Dressing in Place as Prescribed: Yes Pain Present Now: No Electronic Signature(s) Signed: 03/17/2019 11:22:54 AM By: Army Melia Entered By: Army Melia on 03/17/2019 10:10:52 [name] ([id number]) -------------------------------------------------------------------------------- Clinic Level of Care Assessment Details Patient Name: [name] Date of Service: 03/17/2019 10:00 AM Medical Record Number: [id number] Patient Account Number: [id number] Date of Birth/Sex: 03-05-1946 (73 y.o. M) Treating RN: Montey Hora Primary Care Nysa Sarin: Glendon Axe Other Clinician: Referring Arria Naim: Glendon Axe Treating Jatavian Calica/Extender: STONE III, HOYT Weeks in Treatment: 13 Clinic Level of Care Assessment Items TOOL 4 Quantity Score []  - Use when only an EandM is performed on FOLLOW-UP visit 0 ASSESSMENTS - Nursing Assessment /  Reassessment X - Reassessment of Co-morbidities (includes updates in patient status) 1 10 X- 1 5 Reassessment of Adherence to Treatment Plan ASSESSMENTS - Wound and Skin Assessment / Reassessment X - Simple Wound Assessment / Reassessment - one wound 1 5 []  - 0 Complex Wound Assessment / Reassessment - multiple wounds []  - 0 Dermatologic / Skin Assessment (not related to wound area) ASSESSMENTS - Focused Assessment []  - Circumferential Edema Measurements - multi extremities 0 []  - 0 Nutritional Assessment / Counseling / Intervention X- 1 5 Lower Extremity Assessment (monofilament, tuning fork, pulses) []  - 0 Peripheral Arterial Disease Assessment (using hand held doppler) ASSESSMENTS - Ostomy and/or Continence Assessment and Care []  - Incontinence Assessment and Management 0 []  - 0 Ostomy Care Assessment and Management (repouching, etc.) PROCESS - Coordination of Care X - Simple Patient / Family Education for ongoing care 1 15 []  - 0 Complex (extensive) Patient / Family Education for ongoing care X- 1 10 Staff obtains Programmer, systems, Records, Test Results / Process Orders []  - 0 Staff telephones HHA, Nursing Homes / Clarify orders / etc []  - 0 Routine Transfer to another Facility (non-emergent condition) []  - 0 Routine Hospital Admission (non-emergent condition) []  - 0 New Admissions / Biomedical engineer / Ordering NPWT, Apligraf, etc. []  - 0 Emergency Hospital Admission (emergent condition) X- 1 10 Simple Discharge Coordination EULISES, KIJOWSKI ([id number]) []  - 0 Complex (extensive) Discharge Coordination PROCESS - Special Needs []  - Pediatric / Minor Patient Management 0 []  - 0 Isolation Patient Management []  - 0 Hearing / Language / Visual special needs []  - 0 Assessment of Community assistance (transportation, D/C planning, etc.) []  - 0 Additional assistance / Altered mentation []  - 0 Support Surface(s) Assessment (bed, cushion, seat,  etc.) INTERVENTIONS - Wound Cleansing / Measurement X - Simple Wound Cleansing - one wound 1 5 []  - 0 Complex Wound Cleansing - multiple wounds X- 1 5 Wound Imaging (photographs -  any number of wounds) []  - 0 Wound Tracing (instead of photographs) X- 1 5 Simple Wound Measurement - one wound []  - 0 Complex Wound Measurement - multiple wounds INTERVENTIONS - Wound Dressings X - Small Wound Dressing one or multiple wounds 1 10 []  - 0 Medium Wound Dressing one or multiple wounds []  - 0 Large Wound Dressing one or multiple wounds []  - 0 Application of Medications - topical []  - 0 Application of Medications - injection INTERVENTIONS - Miscellaneous []  - External ear exam 0 []  - 0 Specimen Collection (cultures, biopsies, blood, body fluids, etc.) []  - 0 Specimen(s) / Culture(s) sent or taken to Lab for analysis []  - 0 Patient Transfer (multiple staff / Civil Service fast streamer / Similar devices) []  - 0 Simple Staple / Suture removal (25 or less) []  - 0 Complex Staple / Suture removal (26 or more) []  - 0 Hypo / Hyperglycemic Management (close monitor of Blood Glucose) []  - 0 Ankle / Brachial Index (ABI) - do not check if billed separately X- 1 5 Vital Signs [name], [name] ([id number]) Has the patient been seen at the hospital within the last three years: Yes Total Score: 90 Level Of Care: New/Established - Level 3 Electronic Signature(s) Signed: 03/17/2019 3:50:35 PM By: Montey Hora Entered By: Montey Hora on 03/17/2019 12:49:51 [name], [name] ([id number]) -------------------------------------------------------------------------------- Encounter Discharge Information Details Patient Name: [name]. Date of Service: 03/17/2019 10:00 AM Medical Record Number: [id number] Patient Account Number: [id number] Date of Birth/Sex: 07/13/1946 (73 y.o. M) Treating RN: Montey Hora Primary Care Klynn Linnemann: Glendon Axe Other Clinician: Referring Lamekia Nolden:  Glendon Axe Treating Geralda Baumgardner/Extender: Melburn Hake, HOYT Weeks in Treatment: 13 Encounter Discharge Information Items Discharge Condition: Stable Ambulatory Status: Cane Discharge Destination: Home Transportation: Private Auto Accompanied By: self Schedule Follow-up Appointment: Yes Clinical Summary of Care: Electronic Signature(s) Signed: 03/17/2019 12:50:37 PM By: Montey Hora Entered By: Montey Hora on 03/17/2019 12:50:37 [name] ([id number]) -------------------------------------------------------------------------------- Lower Extremity Assessment Details Patient Name: [name] Date of Service: 03/17/2019 10:00 AM Medical Record Number: [id number] Patient Account Number: [id number] Date of Birth/Sex: Apr 07, 1946 (73 y.o. M) Treating RN: Army Melia Primary Care Ihsan Nomura: Glendon Axe Other Clinician: Referring Seda Kronberg: Glendon Axe Treating Lilymae Swiech/Extender: STONE III, HOYT Weeks in Treatment: 13 Edema Assessment Assessed: [Left: No] [Right: No] Edema: [Left: N] [Right: o] Electronic Signature(s) Signed: 03/17/2019 11:22:54 AM By: Army Melia Entered By: Army Melia on 03/17/2019 10:16:44 [name], [name] ([id number]) -------------------------------------------------------------------------------- Multi Wound Chart Details Patient Name: [name]. Date of Service: 03/17/2019 10:00 AM Medical Record Number: [id number] Patient Account Number: [id number] Date of Birth/Sex: 1946-09-15 (73 y.o. M) Treating RN: Montey Hora Primary Care Jakin Pavao: Glendon Axe Other Clinician: Referring Terrill Alperin: Glendon Axe Treating Keysi Oelkers/Extender: STONE III, HOYT Weeks in Treatment: 13 Vital Signs Height(in): 72 Pulse(bpm): 64 Weight(lbs): 250 Blood Pressure(mmHg): 132/64 Body Mass Index(BMI): 34 Temperature(F): 98.4 Respiratory Rate 16 (breaths/min): Photos: [1R:No Photos] [N/A:N/A] Wound Location: [1R:Left Toe  Great] [N/A:N/A] Wounding Event: [1R:Gradually Appeared] [N/A:N/A] Primary Etiology: [1R:Diabetic Wound/Ulcer of the N/A Lower Extremity] Comorbid History: [1R:Hypertension, Type II Diabetes N/A] Date Acquired: [1R:11/30/2017] [N/A:N/A] Weeks of Treatment: [1R:13] [N/A:N/A] Wound Status: [1R:Open] [N/A:N/A] Wound Recurrence: [1R:Yes] [N/A:N/A] Measurements L x W x D [1R:0.2x0.2x0.2] [N/A:N/A] (cm) Area (cm) : [1R:0.031] [N/A:N/A] Volume (cm) : [1R:0.006] [N/A:N/A] % Reduction in Area: [1R:95.10%] [N/A:N/A] % Reduction in Volume: [1R:90.60%] [N/A:N/A] Classification: [1R:Grade 2] [N/A:N/A] Exudate Amount: [1R:Small] [N/A:N/A] Exudate Type: [1R:Serosanguineous] [N/A:N/A] Exudate Color: [1R:red, brown] [N/A:N/A] Wound Margin: [1R:Thickened] [N/A:N/A] Granulation Amount: [1R:Large (67-100%)] [  N/A:N/A] Granulation Quality: [1R:Pale] [N/A:N/A] Necrotic Amount: [1R:Small (1-33%)] [N/A:N/A] Exposed Structures: [1R:Fat Layer (Subcutaneous Tissue) Exposed: Yes Fascia: No Tendon: No Muscle: No Joint: No Bone: No] [N/A:N/A] Epithelialization: [1R:Large (67-100%)] [N/A:N/A] Periwound Skin Texture: [1R:Callus: Yes Excoriation: No Induration: No Crepitus: No] [N/A:N/A] Rash: No Scarring: No Periwound Skin Moisture: Maceration: No N/A N/A Dry/Scaly: No Periwound Skin Color: Ecchymosis: Yes N/A N/A Atrophie Blanche: No Cyanosis: No Erythema: No Hemosiderin Staining: No Mottled: No Pallor: No Rubor: No Temperature: No Abnormality N/A N/A Tenderness on Palpation: Yes N/A N/A Wound Preparation: Ulcer Cleansing: N/A N/A Rinsed/Irrigated with Saline, Other: soap and water Topical Anesthetic Applied: Other: lidocaine 4% Treatment Notes Electronic Signature(s) Signed: 03/17/2019 3:50:35 PM By: Montey Hora Entered By: Montey Hora on 03/17/2019 10:33:36 [name], [name]  ([id number]) -------------------------------------------------------------------------------- [place] Details Patient Name: [name] Date of Service: 03/17/2019 10:00 AM Medical Record Number: [id number] Patient Account Number: [id number] Date of Birth/Sex: 07/24/1946 (73 y.o. M) Treating RN: Montey Hora Primary Care Alsace Dowd: Glendon Axe Other Clinician: Referring Brenlyn Beshara: Glendon Axe Treating Kiyanna Biegler/Extender: STONE III, HOYT Weeks in Treatment: 13 Active Inactive Peripheral Neuropathy Nursing Diagnoses: Knowledge deficit related to disease process and management of peripheral neurovascular dysfunction Goals: Patient/caregiver will verbalize understanding of disease process and disease management Date Initiated: 03/17/2019 Target Resolution Date: 06/03/2019 Goal Status: Active Interventions: Assess signs and symptoms of neuropathy upon admission and as needed Provide education on Management of Neuropathy and Related Ulcers Notes: Wound/Skin Impairment Nursing Diagnoses: Impaired tissue integrity Goals: Ulcer/skin breakdown will have a volume reduction of 30% by week 4 Date Initiated: 12/12/2018 Target Resolution Date: 02/25/2019 Goal Status: Active Interventions: Assess patient/caregiver ability to obtain necessary supplies Assess patient/caregiver ability to perform ulcer/skin care regimen upon admission and as needed Notes: Electronic Signature(s) Signed: 03/17/2019 3:50:35 PM By: Montey Hora Entered By: Montey Hora on 03/17/2019 10:33:14 [name] ([id number]) -------------------------------------------------------------------------------- Pain Assessment Details Patient Name: [name]. Date of Service: 03/17/2019 10:00 AM Medical Record Number: [id number] Patient Account Number: [id number] Date of Birth/Sex: 04-30-1946 (73 y.o. M) Treating RN: Army Melia Primary Care Lamisha Roussell: Glendon Axe  Other Clinician: Referring Maidie Streight: Glendon Axe Treating Darron Stuck/Extender: STONE III, HOYT Weeks in Treatment: 13 Active Problems Location of Pain Severity and Description of Pain Patient Has Paino No Site Locations Pain Management and Medication Current Pain Management: Electronic Signature(s) Signed: 03/17/2019 11:22:54 AM By: Army Melia Entered By: Army Melia on 03/17/2019 10:11:12 [name] ([id number]) -------------------------------------------------------------------------------- Patient/Caregiver Education Details Patient Name: [name]. Date of Service: 03/17/2019 10:00 AM Medical Record Number: [id number] Patient Account Number: [id number] Date of Birth/Gender: [birth year] (73 y.o. M) Treating RN: Montey Hora Primary Care Physician: Glendon Axe Other Clinician: Referring Physician: Glendon Axe Treating Physician/Extender: Sharalyn Ink in Treatment: 13 Education Assessment Education Provided To: Patient Education Topics Provided Wound/Skin Impairment: Handouts: Other: wound care sa ordered Methods: Demonstration, Explain/Verbal Responses: State content correctly Electronic Signature(s) Signed: 03/17/2019 3:50:35 PM By: Montey Hora Entered By: Montey Hora on 03/17/2019 12:50:58 [name], [name] ([id number]) -------------------------------------------------------------------------------- Wound Assessment Details Patient Name: [name]. Date of Service: 03/17/2019 10:00 AM Medical Record Number: [id number] Patient Account Number: [id number] Date of Birth/Sex: 12-27-1946 (73 y.o. M) Treating RN: Army Melia Primary Care Zetta Stoneman: Glendon Axe Other Clinician: Referring Ewing Fandino: Glendon Axe Treating Reeanna Acri/Extender: STONE III, HOYT Weeks in Treatment: 13 Wound Status Wound Number: 1R Primary Etiology: Diabetic Wound/Ulcer of the Lower Extremity Wound Location: Left Toe Great Wound  Status: Open Wounding Event: Gradually Appeared Comorbid Hypertension,  Type II Diabetes Date Acquired: 11/30/2017 History: Weeks Of Treatment: 13 Clustered Wound: No Photos Photo Uploaded By: Army Melia on 03/17/2019 11:07:15 Wound Measurements Length: (cm) 0.2 Width: (cm) 0.2 Depth: (cm) 0.2 Area: (cm) 0.031 Volume: (cm) 0.006 % Reduction in Area: 95.1% % Reduction in Volume: 90.6% Epithelialization: Large (67-100%) Tunneling: No Undermining: No Wound Description Classification: Grade 2 Foul Odo Wound Margin: Thickened Slough/F Exudate Amount: Small Exudate Type: Serosanguineous Exudate Color: red, brown r After Cleansing: No ibrino Yes Wound Bed Granulation Amount: Large (67-100%) Exposed Structure Granulation Quality: Pale Fascia Exposed: No Necrotic Amount: Small (1-33%) Fat Layer (Subcutaneous Tissue) Exposed: Yes Necrotic Quality: Adherent Slough Tendon Exposed: No Muscle Exposed: No Joint Exposed: No Bone Exposed: No Periwound Skin Texture Gershman, Fred L. ([id number]) Texture Color No Abnormalities Noted: No No Abnormalities Noted: No Callus: Yes Atrophie Blanche: No Crepitus: No Cyanosis: No Excoriation: No Ecchymosis: Yes Induration: No Erythema: No Rash: No Hemosiderin Staining: No Scarring: No Mottled: No Pallor: No Moisture Rubor: No No Abnormalities Noted: No Dry / Scaly: No Temperature / Pain Maceration: No Temperature: No Abnormality Tenderness on Palpation: Yes Wound Preparation Ulcer Cleansing: Rinsed/Irrigated with Saline, Other: soap and water, Topical Anesthetic Applied: Other: lidocaine 4%, Treatment Notes Wound #1R (Left Toe Great) Notes prisma, foam, conform and tape Electronic Signature(s) Signed: 03/17/2019 11:22:54 AM By: Army Melia Entered By: Army Melia on 03/17/2019 10:16:30 [name] ([id number]) -------------------------------------------------------------------------------- Vitals  Details Patient Name: [name]. Date of Service: 03/17/2019 10:00 AM Medical Record Number: [id number] Patient Account Number: [id number] Date of Birth/Sex: May 24, 1946 (73 y.o. M) Treating RN: Army Melia Primary Care Raylene Carmickle: Glendon Axe Other Clinician: Referring Don Tiu: Glendon Axe Treating Jamiria Langill/Extender: STONE III, HOYT Weeks in Treatment: 13 Vital Signs Time Taken: 10:11 Temperature (F): 98.4 Height (in): 72 Pulse (bpm): 64 Weight (lbs): 250 Respiratory Rate (breaths/min): 16 Body Mass Index (BMI): 33.9 Blood Pressure (mmHg): 132/64 Reference Range: 80 - 120 mg / dl Electronic Signature(s) Signed: 03/17/2019 11:22:54 AM By: Army Melia Entered By: Army Melia on 03/17/2019 10:12:28

## 2019-03-20 ENCOUNTER — Ambulatory Visit: Payer: Non-veteran care | Admitting: Physician Assistant

## 2019-03-22 DIAGNOSIS — N183 Chronic kidney disease, stage 3 unspecified: Secondary | ICD-10-CM | POA: Insufficient documentation

## 2019-03-22 DIAGNOSIS — E1122 Type 2 diabetes mellitus with diabetic chronic kidney disease: Secondary | ICD-10-CM | POA: Insufficient documentation

## 2019-03-24 ENCOUNTER — Other Ambulatory Visit: Payer: Self-pay

## 2019-03-24 ENCOUNTER — Encounter: Payer: Medicare Other | Admitting: Physician Assistant

## 2019-03-24 DIAGNOSIS — E11621 Type 2 diabetes mellitus with foot ulcer: Secondary | ICD-10-CM | POA: Diagnosis not present

## 2019-03-25 NOTE — Progress Notes (Signed)
YU, PEGGS ([id number]) Visit Report for 03/24/2019 Arrival Information Details Patient Name: [name], [name]. Date of Service: 03/24/2019 8:15 AM Medical Record Number: [id number] Patient Account Number: [id number] Date of Birth/Sex: 09-30-1946 (73 y.o. M) Treating RN: Harold Barban Primary Care Gerilynn Mccullars: Glendon Axe Other Clinician: Referring Junious Ragone: Glendon Axe Treating Leland Staszewski/Extender: Melburn Hake, HOYT Weeks in Treatment: 14 Visit Information History Since Last Visit Added or deleted any medications: No Patient Arrived: Cane Any new allergies or adverse reactions: No Arrival Time: 08:19 Had a fall or experienced change in No Accompanied By: self activities of daily living that may affect Transfer Assistance: None risk of falls: Patient Identification Verified: Yes Signs or symptoms of abuse/neglect since last visito No Secondary Verification Process Completed: Yes Has Dressing in Place as Prescribed: Yes Patient Has Alerts: Yes Pain Present Now: No Patient Alerts: DMII Electronic Signature(s) Signed: 03/24/2019 3:52:12 PM By: Harold Barban Entered By: Harold Barban on 03/24/2019 08:21:49 [name] ([id number]) -------------------------------------------------------------------------------- Encounter Discharge Information Details Patient Name: [name]. Date of Service: 03/24/2019 8:15 AM Medical Record Number: [id number] Patient Account Number: [id number] Date of Birth/Sex: 03/27/1946 (73 y.o. M) Treating RN: Harold Barban Primary Care Eshaan Titzer: Glendon Axe Other Clinician: Referring Devontae Casasola: Glendon Axe Treating Aldine Chakraborty/Extender: STONE III, HOYT Weeks in Treatment: 14 Encounter Discharge Information Items Post Procedure Vitals Discharge Condition: Stable Temperature (F): 98.5 Ambulatory Status: Cane Pulse (bpm): 70 Discharge Destination: Home Respiratory Rate (breaths/min): 18 Transportation:  Private Auto Blood Pressure (mmHg): 117/78 Accompanied By: self Schedule Follow-up Appointment: Yes Clinical Summary of Care: Electronic Signature(s) Signed: 03/24/2019 10:51:51 AM By: Harold Barban Entered By: Harold Barban on 03/24/2019 10:51:50 [name] ([id number]) -------------------------------------------------------------------------------- Lower Extremity Assessment Details Patient Name: [name]. Date of Service: 03/24/2019 8:15 AM Medical Record Number: [id number] Patient Account Number: [id number] Date of Birth/Sex: 1946/02/23 (73 y.o. M) Treating RN: Harold Barban Primary Care Sharisse Rantz: Glendon Axe Other Clinician: Referring Jamiere Gulas: Glendon Axe Treating Ilyas Lipsitz/Extender: STONE III, HOYT Weeks in Treatment: 14 Vascular Assessment Pulses: Dorsalis Pedis Palpable: [Left:Yes] Posterior Tibial Palpable: [Left:Yes] Extremity colors, hair growth, and conditions: Hair Growth on Extremity: [Left:Yes] Temperature of Extremity: [Left:Warm < 3 seconds] Toe Nail Assessment Left: Right: Thick: No Discolored: No Deformed: No Improper Length and Hygiene: No Electronic Signature(s) Signed: 03/24/2019 3:52:12 PM By: Harold Barban Entered By: Harold Barban on 03/24/2019 08:30:26 Plasencia, [name] ([id number]) -------------------------------------------------------------------------------- Multi Wound Chart Details Patient Name: [name]. Date of Service: 03/24/2019 8:15 AM Medical Record Number: [id number] Patient Account Number: [id number] Date of Birth/Sex: 1946/09/21 (73 y.o. M) Treating RN: Montey Hora Primary Care Jae Bruck: Glendon Axe Other Clinician: Referring Travonta Gill: Glendon Axe Treating Pamella Samons/Extender: STONE III, HOYT Weeks in Treatment: 14 Vital Signs Height(in): 72 Pulse(bpm): 70 Weight(lbs): 250 Blood Pressure(mmHg): 117/78 Body Mass Index(BMI): 34 Temperature(F): 98.5 Respiratory  Rate 18 (breaths/min): Photos: [N/A:N/A] Wound Location: Left Toe Great N/A N/A Wounding Event: Gradually Appeared N/A N/A Primary Etiology: Diabetic Wound/Ulcer of the N/A N/A Lower Extremity Comorbid History: Hypertension, Type II Diabetes N/A N/A Date Acquired: 11/30/2017 N/A N/A Weeks of Treatment: 14 N/A N/A Wound Status: Open N/A N/A Wound Recurrence: Yes N/A N/A Measurements L x W x D 0.2x0.2x0.2 N/A N/A (cm) Area (cm) : 0.031 N/A N/A Volume (cm) : 0.006 N/A N/A % Reduction in Area: 95.10% N/A N/A % Reduction in Volume: 90.60% N/A N/A Classification: Grade 2 N/A N/A Exudate Amount: Small N/A N/A Exudate Type: Serosanguineous N/A N/A Exudate Color: red, brown N/A N/A Wound Margin: Thickened N/A N/A Granulation  Amount: Large (67-100%) N/A N/A Granulation Quality: Pale N/A N/A Necrotic Amount: Small (1-33%) N/A N/A Exposed Structures: Fat Layer (Subcutaneous N/A N/A Tissue) Exposed: Yes Fascia: No Tendon: No Muscle: No YUSEF, LAMP ([id number]) Joint: No Bone: No Epithelialization: Large (67-100%) N/A N/A Periwound Skin Texture: Callus: Yes N/A N/A Excoriation: No Induration: No Crepitus: No Rash: No Scarring: No Periwound Skin Moisture: Maceration: Yes N/A N/A Dry/Scaly: No Periwound Skin Color: Ecchymosis: Yes N/A N/A Atrophie Blanche: No Cyanosis: No Erythema: No Hemosiderin Staining: No Mottled: No Pallor: No Rubor: No Temperature: No Abnormality N/A N/A Tenderness on Palpation: Yes N/A N/A Wound Preparation: Ulcer Cleansing: N/A N/A Rinsed/Irrigated with Saline, Other: soap and water Topical Anesthetic Applied: Other: lidocaine 4% Treatment Notes Electronic Signature(s) Signed: 03/24/2019 4:37:01 PM By: Montey Hora Entered By: Montey Hora on 03/24/2019 08:38:17 Dwyer, [name] ([id number]) -------------------------------------------------------------------------------- [place] Details Patient  Name: [name] Date of Service: 03/24/2019 8:15 AM Medical Record Number: [id number] Patient Account Number: [id number] Date of Birth/Sex: [birth year] (73 y.o. M) Treating RN: Montey Hora Primary Care Keven Soucy: Glendon Axe Other Clinician: Referring Ilias Stcharles: Glendon Axe Treating Shermaine Brigham/Extender: STONE III, HOYT Weeks in Treatment: 14 Active Inactive Peripheral Neuropathy Nursing Diagnoses: Knowledge deficit related to disease process and management of peripheral neurovascular dysfunction Goals: Patient/caregiver will verbalize understanding of disease process and disease management Date Initiated: 03/17/2019 Target Resolution Date: 06/03/2019 Goal Status: Active Interventions: Assess signs and symptoms of neuropathy upon admission and as needed Provide education on Management of Neuropathy and Related Ulcers Notes: Wound/Skin Impairment Nursing Diagnoses: Impaired tissue integrity Goals: Ulcer/skin breakdown will have a volume reduction of 30% by week 4 Date Initiated: 12/12/2018 Target Resolution Date: 02/25/2019 Goal Status: Active Interventions: Assess patient/caregiver ability to obtain necessary supplies Assess patient/caregiver ability to perform ulcer/skin care regimen upon admission and as needed Notes: Electronic Signature(s) Signed: 03/24/2019 4:37:01 PM By: Montey Hora Entered By: Montey Hora on 03/24/2019 08:38:09 [name] ([id number]) -------------------------------------------------------------------------------- Pain Assessment Details Patient Name: [name]. Date of Service: 03/24/2019 8:15 AM Medical Record Number: [id number] Patient Account Number: [id number] Date of Birth/Sex: November 24, 1946 (73 y.o. M) Treating RN: Harold Barban Primary Care Malijah Lietz: Glendon Axe Other Clinician: Referring Rawlin Reaume: Glendon Axe Treating Natalin Bible/Extender: STONE III, HOYT Weeks in Treatment: 14 Active  Problems Location of Pain Severity and Description of Pain Patient Has Paino No Site Locations Pain Management and Medication Current Pain Management: Electronic Signature(s) Signed: 03/24/2019 3:52:12 PM By: Harold Barban Entered By: Harold Barban on 03/24/2019 08:22:01 [name] ([id number]) -------------------------------------------------------------------------------- Patient/Caregiver Education Details Patient Name: [name]. Date of Service: 03/24/2019 8:15 AM Medical Record Number: [id number] Patient Account Number: [id number] Date of Birth/Gender: 10/03/1946 (73 y.o. M) Treating RN: Montey Hora Primary Care Physician: Glendon Axe Other Clinician: Referring Physician: Glendon Axe Treating Physician/Extender: Sharalyn Ink in Treatment: 14 Education Assessment Education Provided To: Patient Education Topics Provided Offloading: Handouts: Other: reason for not putting patient in a cast Methods: Explain/Verbal Responses: State content correctly Electronic Signature(s) Signed: 03/24/2019 4:37:01 PM By: Montey Hora Entered By: Montey Hora on 03/24/2019 08:40:17 [place], [name] ([id number]) -------------------------------------------------------------------------------- Wound Assessment Details Patient Name: [name]. Date of Service: 03/24/2019 8:15 AM Medical Record Number: [id number] Patient Account Number: [id number] Date of Birth/Sex: [birth year] (73 y.o. M) Treating RN: Harold Barban Primary Care Sina Lucchesi: Glendon Axe Other Clinician: Referring Alyas Creary: Glendon Axe Treating Carlene Bickley/Extender: STONE III, HOYT Weeks in Treatment: 14 Wound Status Wound Number: [id number] Primary Etiology: Diabetic Wound/Ulcer of the Lower Extremity  Wound Location: Left Toe Great Wound Status: Open Wounding Event: Gradually Appeared Comorbid Hypertension, Type II Diabetes Date Acquired:  11/30/2017 History: Weeks Of Treatment: 14 Clustered Wound: No Photos Wound Measurements Length: (cm) 0.2 % Reduction in Width: (cm) 0.2 % Reduction in Depth: (cm) 0.2 Epithelializati Area: (cm) 0.031 Tunneling: Volume: (cm) 0.006 Undermining: Area: 95.1% Volume: 90.6% on: Large (67-100%) No No Wound Description Classification: Grade 2 Foul Odor After Wound Margin: Thickened Slough/Fibrino Exudate Amount: Small Exudate Type: Serosanguineous Exudate Color: red, brown Cleansing: No Yes Wound Bed Granulation Amount: Large (67-100%) Exposed Structure Granulation Quality: Pale Fascia Exposed: No Necrotic Amount: Small (1-33%) Fat Layer (Subcutaneous Tissue) Exposed: Yes Necrotic Quality: Adherent Slough Tendon Exposed: No Muscle Exposed: No Joint Exposed: No Bone Exposed: No Periwound Skin Texture Texture Color Santilli, Ballard L. ([id number]) No Abnormalities Noted: No No Abnormalities Noted: No Callus: Yes Atrophie Blanche: No Crepitus: No Cyanosis: No Excoriation: No Ecchymosis: Yes Induration: No Erythema: No Rash: No Hemosiderin Staining: No Scarring: No Mottled: No Pallor: No Moisture Rubor: No No Abnormalities Noted: No Dry / Scaly: No Temperature / Pain Maceration: Yes Temperature: No Abnormality Tenderness on Palpation: Yes Wound Preparation Ulcer Cleansing: Rinsed/Irrigated with Saline, Other: soap and water, Topical Anesthetic Applied: Other: lidocaine 4%, Treatment Notes Wound #1R (Left Toe Great) Notes prisma, foam, conform and tape Electronic Signature(s) Signed: 03/24/2019 3:52:12 PM By: Harold Barban Entered By: Harold Barban on 03/24/2019 08:29:53 Mcgahee, [name] ([id number]) -------------------------------------------------------------------------------- Vitals Details Patient Name: [name]. Date of Service: 03/24/2019 8:15 AM Medical Record Number: [id number] Patient Account Number:  [id number] Date of Birth/Sex: 21-Dec-1946 (73 y.o. M) Treating RN: Harold Barban Primary Care Keirsten Matuska: Glendon Axe Other Clinician: Referring Cassondra Stachowski: Glendon Axe Treating Tom Macpherson/Extender: STONE III, HOYT Weeks in Treatment: 14 Vital Signs Time Taken: 08:20 Temperature (F): 98.5 Height (in): 72 Pulse (bpm): 70 Weight (lbs): 250 Respiratory Rate (breaths/min): 18 Body Mass Index (BMI): 33.9 Blood Pressure (mmHg): 117/78 Reference Range: 80 - 120 mg / dl Notes Took BP manually. Machine read 158/89. Patient stated the last two days is blood pressure was in the manual reading range. Electronic Signature(s) Signed: 03/24/2019 3:52:12 PM By: Harold Barban Entered By: Harold Barban on 03/24/2019 08:23:13

## 2019-03-25 NOTE — Progress Notes (Signed)
KIRT, CHEW ([id number]) Visit Report for 03/24/2019 Chief Complaint Document Details Patient Name: [name], [name]. Date of Service: 03/24/2019 8:15 AM Medical Record Number: [id number] Patient Account Number: [id number] Date of Birth/Sex: 1946/08/23 (73 y.o. M) Treating RN: Montey Hora Primary Care Provider: Glendon Axe Other Clinician: Referring Provider: Glendon Axe Treating Provider/Extender: STONE III, Krystal Teachey Weeks in Treatment: 14 Information Obtained from: Patient Chief Complaint Left great toe ulcer Electronic Signature(s) Signed: 03/24/2019 4:16:42 PM By: Worthy Keeler PA-C Entered By: Worthy Keeler on 03/24/2019 08:35:02 [name], [name] ([id number]) -------------------------------------------------------------------------------- Debridement Details Patient Name: [name]. Date of Service: 03/24/2019 8:15 AM Medical Record Number: [id number] Patient Account Number: [id number] Date of Birth/Sex: 1946/03/09 (73 y.o. M) Treating RN: Montey Hora Primary Care Provider: Glendon Axe Other Clinician: Referring Provider: Glendon Axe Treating Provider/Extender: STONE III, Modestine Scherzinger Weeks in Treatment: 14 Debridement Performed for Wound #1R Left Toe Great Assessment: Performed By: Physician STONE III, Elmon Shader E., PA-C Debridement Type: Debridement Severity of Tissue Pre Fat layer exposed Debridement: Level of Consciousness (Pre- Awake and Alert procedure): Pre-procedure Verification/Time Yes - 08:39 Out Taken: Start Time: 08:39 Pain Control: Lidocaine 4% Topical Solution Total Area Debrided (L x W): 0.2 (cm) x 0.2 (cm) = 0.04 (cm) Tissue and other material Viable, Non-Viable, Callus, Slough, Subcutaneous, Slough debrided: Level: Skin/Subcutaneous Tissue Debridement Description: Excisional Instrument: Curette Bleeding: Minimum Hemostasis Achieved: Pressure End Time: 08:42 Procedural Pain: 0 Post Procedural Pain:  0 Response to Treatment: Procedure was tolerated well Level of Consciousness Awake and Alert (Post-procedure): Post Debridement Measurements of Total Wound Length: (cm) 0.3 Width: (cm) 0.3 Depth: (cm) 0.2 Volume: (cm) 0.014 Character of Wound/Ulcer Post Debridement: Improved Severity of Tissue Post Debridement: Fat layer exposed Post Procedure Diagnosis Same as Pre-procedure Electronic Signature(s) Signed: 03/24/2019 4:16:42 PM By: Worthy Keeler PA-C Signed: 03/24/2019 4:37:01 PM By: Montey Hora Entered By: Montey Hora on 03/24/2019 08:44:15 [place] ([id number]) -------------------------------------------------------------------------------- HPI Details Patient Name: [name] Date of Service: 03/24/2019 8:15 AM Medical Record Number: [id number] Patient Account Number: [id number] Date of Birth/Sex: December 29, 1945 (73 y.o. M) Treating RN: Montey Hora Primary Care Provider: Glendon Axe Other Clinician: Referring Provider: Glendon Axe Treating Provider/Extender: Melburn Hake, Promise Bushong Weeks in Treatment: 14 History of Present Illness HPI Description: 12/12/18 on evaluation today patient presents as a referral from his endocrinologist regarding issues that he has been having with his left great toe. Subsequently he has been seeing Dr. Cleda Mccreedy and Dr. Cleda Mccreedy has been the breeding the wound and in fact this was debrided this morning. The patient had an appointment there prior to coming here. Subsequently he states that even though he's been going for regular debridement after office things really do not seem to be showing signs of improvement unfortunately. He states that he is having some discomfort but nothing too significant. No fevers, chills, nausea, or vomiting noted at this time. The patient does have a history of hypertension, neuropathy, diabetes mellitus type II, and a history of stroke. Currently he's been using bacitracin on the toe and  utilizing a Band-Aid. He tells me he's had this wound for two years. He cannot remember the last time he had an x-ray. 12/19/18 on evaluation today patient appears to be doing well in regard to his plantar toe ulcer. He's been tolerating the dressing changes without complication. Fortunately there does not appear to be signs of infection at this time which is good news. No fever chills noted. 01/19/19 has been one month since I last  saw the patient as he was out of town. With that being said on evaluation today it does appear that he is going to require sharp debridement and I do believe that the total contact cast would benefit him as far as being applied at this time. He is in agreement that plan. 01/23/19 on evaluation today patient appears to be doing better in regard to his left great toe ulcer. He has been shown signs of improvement week by week and although this is slow it does seem to be doing fairly well which is good news. Overall very pleased in this regard. 01/31/19 on evaluation today patient presents for follow-up concerning his great toe ulcer. He was actually supposed to be here yesterday and he did come however he ended up having to leave due to his blood sugar dropping he states he just did not eat enough lunch. Fortunately seems to be doing much better today which is excellent news. Fortunately there's no evidence of infection at this time which is also good news. No fevers, chills, nausea, or vomiting noted at this time. Overall I feel like he is making excellent progress. 02/06/19 on evaluation today patient actually appears to be doing very well in regard to his plantar toe ulcer. He's been tolerating the dressing changes without complication. Fortunately there is no sign of infection at this time. Overall been very pleased with how things seem to be progressing. No fevers, chills, nausea, or vomiting noted at this time. 02/13/19 on evaluation today patient actually appears to be  doing excellent in regard to his toe ulcer which is almost completely close. Overall very pleased with that. There does not appear to be any signs of infection which is good news. Upon close inspection it appears that he has just a very slight opening still noted at the central portion of the toe although this is minimal. 02/16/19 on evaluation today patient is seen for early follow-up due to the fact that he tells me while at home he actually got up to go answer the door without putting on the walking boot part of his cast and subsequently damaged the total contact cast. It therefore comes in to have this removed and potentially replaced. Fortunately other than it given him a little bit of pain its far as pushing in on some areas there doesn't appear to be any injury once the cast was removed. 03/13/19 on evaluation today patient unfortunately presents for follow-up concerning his great toe ulcer on the left. He was discharged on 02/16/19 with the wound healed at that point. He tells me this remain so for several weeks or least a couple weeks before reopening. Fortunately there does not appear to be any signs of infection at this time. Nonetheless for the past week at least he's had the wound reopened and states it is been trying to keep pressure off of this but he has not been using the offloading shoe we previously gave him. Fortunately there's no evidence of infection at this time. No fevers, chills, nausea, or vomiting noted at this time. [name], [name] ([id number]) 03/17/19 on evaluation today patient actually appears to be doing rather well in regard to his toe also. Fact this appears to be significantly smaller this week even compared to what I saw last week on evaluation. He seems to have done very well for the offloading shoe and overall I'm very pleased with the progress that is made. It may be that he doesn't even need the cast to  get this to heal if he offloads this  appropriately. 03/24/19 on evaluation today patient actually appears to be doing well in regard to his plantar foot ulcer. He's been tolerating the dressing changes without complication the collagen does seem to be beneficial for him. With that being said he is gonna require some miles sharp debridement today to clear away some of the callous's around the edge of the wound as well as the minimal Slough noted on the surface of the wound. Electronic Signature(s) Signed: 03/24/2019 4:16:42 PM By: Worthy Keeler PA-C Entered By: Worthy Keeler on 03/24/2019 09:57:43 [place], [name] ([id number]) -------------------------------------------------------------------------------- Physical Exam Details Patient Name: [name] Date of Service: 03/24/2019 8:15 AM Medical Record Number: [id number] Patient Account Number: [id number] Date of Birth/Sex: Dec 12, 1946 (73 y.o. M) Treating RN: Montey Hora Primary Care Provider: Glendon Axe Other Clinician: Referring Provider: Glendon Axe Treating Provider/Extender: STONE III, Ashdon Gillson Weeks in Treatment: 2 Constitutional Well-nourished and well-hydrated in no acute distress. Respiratory normal breathing without difficulty. clear to auscultation bilaterally. Cardiovascular regular rate and rhythm with normal S1, S2. Psychiatric this patient is able to make decisions and demonstrates good insight into disease process. Alert and Oriented x 3. pleasant and cooperative. Notes Patient's wound bed currently did require sharp debridement post debridement wound bed appears to be doing separately better which is excellent news I'm very pleased in this regard. Overall think is offloading well with the shoe obviously right now with the current Covid-19 Virus emergency we're not really casting patients in order to avoid having a patient that cannot get the cast cut off due to being ill that could obviously cause other issues and  complications. Electronic Signature(s) Signed: 03/24/2019 4:16:42 PM By: Worthy Keeler PA-C Entered By: Worthy Keeler on 03/24/2019 09:58:16 [place], [name] ([id number]) -------------------------------------------------------------------------------- Physician Orders Details Patient Name: [name] Date of Service: 03/24/2019 8:15 AM Medical Record Number: [id number] Patient Account Number: [id number] Date of Birth/Sex: 1946-04-10 (73 y.o. M) Treating RN: Montey Hora Primary Care Provider: Glendon Axe Other Clinician: Referring Provider: Glendon Axe Treating Provider/Extender: Melburn Hake, Garvey Westcott Weeks in Treatment: 14 Verbal / Phone Orders: No Diagnosis Coding ICD-10 Coding Code Description E11.621 Type 2 diabetes mellitus with foot ulcer L97.522 Non-pressure chronic ulcer of other part of left foot with fat layer exposed I10 Essential (primary) hypertension E11.43 Type 2 diabetes mellitus with diabetic autonomic (poly)neuropathy Wound Cleansing Wound #1R Left Toe Great o Clean wound with Normal Saline. o May Shower, gently pat wound dry prior to applying new dressing. Primary Wound Dressing Wound #1R Left Toe Great o Silver Collagen Secondary Dressing Wound #1R Left Toe Great o Conform/Kerlix o Foam Dressing Change Frequency Wound #1R Left Toe Great o Change dressing every other day. Follow-up Appointments Wound #1R Left Toe Great o Return Appointment in 1 week. Off-Loading Wound #1R Left Toe Great o Open toe surgical shoe to: - left foot, felt surrounding wound Electronic Signature(s) Signed: 03/24/2019 4:16:42 PM By: Worthy Keeler PA-C Signed: 03/24/2019 4:37:01 PM By: Montey Hora Entered By: Montey Hora on 03/24/2019 08:39:38 [place], [name] ([id number]) -------------------------------------------------------------------------------- Problem List Details Patient Name: ADEBAYO, ENSMINGER. Date of  Service: 03/24/2019 8:15 AM Medical Record Number: [id number] Patient Account Number: [id number] Date of Birth/Sex: [birth year] (73 y.o. M) Treating RN: Montey Hora Primary Care Provider: Glendon Axe Other Clinician: Referring Provider: Glendon Axe Treating Provider/Extender: Melburn Hake, Hallel Denherder Weeks in Treatment: 14 Active Problems ICD-10 Evaluated Encounter Code Description Active Date Today Diagnosis E11.621 Type  2 diabetes mellitus with foot ulcer 12/12/2018 No Yes L97.522 Non-pressure chronic ulcer of other part of left foot with fat 12/12/2018 No Yes layer exposed I10 Essential (primary) hypertension 12/12/2018 No Yes E11.43 Type 2 diabetes mellitus with diabetic autonomic (poly) 12/12/2018 No Yes neuropathy Inactive Problems Resolved Problems Electronic Signature(s) Signed: 03/24/2019 4:16:42 PM By: Worthy Keeler PA-C Entered By: Worthy Keeler on 03/24/2019 08:34:56 [place], [name] ([id number]) -------------------------------------------------------------------------------- Progress Note Details Patient Name: [name] Date of Service: 03/24/2019 8:15 AM Medical Record Number: [id number] Patient Account Number: [id number] Date of Birth/Sex: 1946-01-01 (73 y.o. M) Treating RN: Montey Hora Primary Care Provider: Glendon Axe Other Clinician: Referring Provider: Glendon Axe Treating Provider/Extender: Melburn Hake, Dominic Rhome Weeks in Treatment: 14 Subjective Chief Complaint Information obtained from Patient Left great toe ulcer History of Present Illness (HPI) 12/12/18 on evaluation today patient presents as a referral from his endocrinologist regarding issues that he has been having with his left great toe. Subsequently he has been seeing Dr. Cleda Mccreedy and Dr. Cleda Mccreedy has been the breeding the wound and in fact this was debrided this morning. The patient had an appointment there prior to coming here. Subsequently he states that even though he's been  going for regular debridement after office things really do not seem to be showing signs of improvement unfortunately. He states that he is having some discomfort but nothing too significant. No fevers, chills, nausea, or vomiting noted at this time. The patient does have a history of hypertension, neuropathy, diabetes mellitus type II, and a history of stroke. Currently he's been using bacitracin on the toe and utilizing a Band-Aid. He tells me he's had this wound for two years. He cannot remember the last time he had an x-ray. 12/19/18 on evaluation today patient appears to be doing well in regard to his plantar toe ulcer. He's been tolerating the dressing changes without complication. Fortunately there does not appear to be signs of infection at this time which is good news. No fever chills noted. 01/19/19 has been one month since I last saw the patient as he was out of town. With that being said on evaluation today it does appear that he is going to require sharp debridement and I do believe that the total contact cast would benefit him as far as being applied at this time. He is in agreement that plan. 01/23/19 on evaluation today patient appears to be doing better in regard to his left great toe ulcer. He has been shown signs of improvement week by week and although this is slow it does seem to be doing fairly well which is good news. Overall very pleased in this regard. 01/31/19 on evaluation today patient presents for follow-up concerning his great toe ulcer. He was actually supposed to be here yesterday and he did come however he ended up having to leave due to his blood sugar dropping he states he just did not eat enough lunch. Fortunately seems to be doing much better today which is excellent news. Fortunately there's no evidence of infection at this time which is also good news. No fevers, chills, nausea, or vomiting noted at this time. Overall I feel like he is making excellent  progress. 02/06/19 on evaluation today patient actually appears to be doing very well in regard to his plantar toe ulcer. He's been tolerating the dressing changes without complication. Fortunately there is no sign of infection at this time. Overall been very pleased with how things seem to be progressing. No  fevers, chills, nausea, or vomiting noted at this time. 02/13/19 on evaluation today patient actually appears to be doing excellent in regard to his toe ulcer which is almost completely close. Overall very pleased with that. There does not appear to be any signs of infection which is good news. Upon close inspection it appears that he has just a very slight opening still noted at the central portion of the toe although this is minimal. 02/16/19 on evaluation today patient is seen for early follow-up due to the fact that he tells me while at home he actually got up to go answer the door without putting on the walking boot part of his cast and subsequently damaged the total contact cast. It therefore comes in to have this removed and potentially replaced. Fortunately other than it given him a little bit of pain its far as pushing in on some areas there doesn't appear to be any injury once the cast was removed. [name], [name] ([id number]) 03/13/19 on evaluation today patient unfortunately presents for follow-up concerning his great toe ulcer on the left. He was discharged on 02/16/19 with the wound healed at that point. He tells me this remain so for several weeks or least a couple weeks before reopening. Fortunately there does not appear to be any signs of infection at this time. Nonetheless for the past week at least he's had the wound reopened and states it is been trying to keep pressure off of this but he has not been using the offloading shoe we previously gave him. Fortunately there's no evidence of infection at this time. No fevers, chills, nausea, or vomiting noted at this  time. 03/17/19 on evaluation today patient actually appears to be doing rather well in regard to his toe also. Fact this appears to be significantly smaller this week even compared to what I saw last week on evaluation. He seems to have done very well for the offloading shoe and overall I'm very pleased with the progress that is made. It may be that he doesn't even need the cast to get this to heal if he offloads this appropriately. 03/24/19 on evaluation today patient actually appears to be doing well in regard to his plantar foot ulcer. He's been tolerating the dressing changes without complication the collagen does seem to be beneficial for him. With that being said he is gonna require some miles sharp debridement today to clear away some of the callous's around the edge of the wound as well as the minimal Slough noted on the surface of the wound. Patient History Information obtained from Patient. Family History Diabetes - Mother, Heart Disease - Father, Stroke - Father, No family history of Cancer, Hereditary Spherocytosis, Hypertension, Kidney Disease, Lung Disease, Seizures, Thyroid Problems, Tuberculosis. Social History Never smoker, Marital Status - Widowed, Alcohol Use - Never, Drug Use - No History, Caffeine Use - Moderate. Medical History Eyes Denies history of Cataracts, Glaucoma, Optic Neuritis Ear/Nose/Mouth/Throat Denies history of Chronic sinus problems/congestion, Middle ear problems Hematologic/Lymphatic Denies history of Anemia, Hemophilia, Human Immunodeficiency Virus, Lymphedema Respiratory Denies history of Aspiration, Asthma, Chronic Obstructive Pulmonary Disease (COPD), Pneumothorax, Sleep Apnea, Tuberculosis Cardiovascular Patient has history of Hypertension Denies history of Angina, Arrhythmia, Congestive Heart Failure, Coronary Artery Disease, Deep Vein Thrombosis, Hypotension, Myocardial Infarction, Peripheral Arterial Disease, Peripheral Venous Disease,  Phlebitis, Vasculitis Gastrointestinal Denies history of Cirrhosis , Colitis, Crohn s, Hepatitis A, Hepatitis B, Hepatitis C Endocrine Patient has history of Type II Diabetes Denies history of Type I Diabetes Genitourinary  Denies history of End Stage Renal Disease Immunological Denies history of Lupus Erythematosus, Raynaud s, Scleroderma Integumentary (Skin) Denies history of History of Burn, History of pressure wounds Musculoskeletal Denies history of Gout, Rheumatoid Arthritis, Osteoarthritis, Osteomyelitis Neurologic Denies history of Dementia, Neuropathy, Quadriplegia, Paraplegia, Seizure Disorder Oncologic Denies history of Received Chemotherapy, Received Radiation [name], [name] ([id number]) Review of Systems (ROS) Constitutional Symptoms (General Health) Denies complaints or symptoms of Fever, Chills. Respiratory The patient has no complaints or symptoms. Cardiovascular The patient has no complaints or symptoms. Psychiatric The patient has no complaints or symptoms. Objective Constitutional Well-nourished and well-hydrated in no acute distress. Vitals Time Taken: 8:20 AM, Height: 72 in, Weight: 250 lbs, BMI: 33.9, Temperature: 98.5 F, Pulse: 70 bpm, Respiratory Rate: 18 breaths/min, Blood Pressure: 117/78 mmHg. General Notes: Took BP manually. Machine read 158/89. Patient stated the last two days is blood pressure was in the manual reading range. Respiratory normal breathing without difficulty. clear to auscultation bilaterally. Cardiovascular regular rate and rhythm with normal S1, S2. Psychiatric this patient is able to make decisions and demonstrates good insight into disease process. Alert and Oriented x 3. pleasant and cooperative. General Notes: Patient's wound bed currently did require sharp debridement post debridement wound bed appears to be doing separately better which is excellent news I'm very pleased in this regard. Overall think is  offloading well with the shoe obviously right now with the current Covid-19 Virus emergency we're not really casting patients in order to avoid having a patient that cannot get the cast cut off due to being ill that could obviously cause other issues and complications. Integumentary (Hair, Skin) Wound #1R status is Open. Original cause of wound was Gradually Appeared. The wound is located on the Left Toe Great. The wound measures 0.2cm length x 0.2cm width x 0.2cm depth; 0.031cm^2 area and 0.006cm^3 volume. There is Fat Layer (Subcutaneous Tissue) Exposed exposed. There is no tunneling or undermining noted. There is a small amount of serosanguineous drainage noted. The wound margin is thickened. There is large (67-100%) pale granulation within the wound bed. There is a small (1-33%) amount of necrotic tissue within the wound bed including Adherent Slough. The periwound skin appearance exhibited: Callus, Maceration, Ecchymosis. The periwound skin appearance did not exhibit: Crepitus, Excoriation, Induration, Rash, Scarring, Dry/Scaly, Atrophie Blanche, Cyanosis, Hemosiderin Staining, Mottled, Pallor, Rubor, Erythema. Periwound temperature was noted as No Abnormality. The periwound has tenderness on palpation. [name], [name] ([id number]) Assessment Active Problems ICD-10 Type 2 diabetes mellitus with foot ulcer Non-pressure chronic ulcer of other part of left foot with fat layer exposed Essential (primary) hypertension Type 2 diabetes mellitus with diabetic autonomic (poly)neuropathy Procedures Wound #1R Pre-procedure diagnosis of Wound #1R is a Diabetic Wound/Ulcer of the Lower Extremity located on the Left Toe Great .Severity of Tissue Pre Debridement is: Fat layer exposed. There was a Excisional Skin/Subcutaneous Tissue Debridement with a total area of 0.04 sq cm performed by STONE III, Lynett Brasil E., PA-C. With the following instrument(s): Curette to remove Viable and Non-Viable  tissue/material. Material removed includes Callus, Subcutaneous Tissue, and Slough after achieving pain control using Lidocaine 4% Topical Solution. No specimens were taken. A time out was conducted at 08:39, prior to the start of the procedure. A Minimum amount of bleeding was controlled with Pressure. The procedure was tolerated well with a pain level of 0 throughout and a pain level of 0 following the procedure. Post Debridement Measurements: 0.3cm length x 0.3cm width x 0.2cm depth; 0.014cm^3 volume. Character of Wound/Ulcer  Post Debridement is improved. Severity of Tissue Post Debridement is: Fat layer exposed. Post procedure Diagnosis Wound #1R: Same as Pre-Procedure Plan Wound Cleansing: Wound #1R Left Toe Great: Clean wound with Normal Saline. May Shower, gently pat wound dry prior to applying new dressing. Primary Wound Dressing: Wound #1R Left Toe Great: Silver Collagen Secondary Dressing: Wound #1R Left Toe Great: Conform/Kerlix Foam Dressing Change Frequency: Wound #1R Left Toe Great: Change dressing every other day. Follow-up Appointments: Wound #1R Left Toe Great: Return Appointment in 1 week. Off-Loading: Wound #1R Left Toe Great: Open toe surgical shoe to: - left foot, felt surrounding wound Einstein, [name] ([id number]) My suggestion at this point is gonna be that we continue with the above wound care measures for the next week and the patient is in agreement with plan. We will subsequently see were things that at follow-up. If anything changes worsens meantime will contact the office and let me know. Please see above for specific wound care orders. We will see patient for re-evaluation in 1 week(s) here in the clinic. If anything worsens or changes patient will contact our office for additional recommendations. Electronic Signature(s) Signed: 03/24/2019 4:16:42 PM By: Worthy Keeler PA-C Entered By: Worthy Keeler on 03/24/2019 09:58:35 Schwieger,  [name] ([id number]) -------------------------------------------------------------------------------- ROS/PFSH Details Patient Name: [name] Date of Service: 03/24/2019 8:15 AM Medical Record Number: [id number] Patient Account Number: [id number] Date of Birth/Sex: [birth year] (73 y.o. M) Treating RN: Montey Hora Primary Care Provider: Glendon Axe Other Clinician: Referring Provider: Glendon Axe Treating Provider/Extender: STONE III, Domonik Levario Weeks in Treatment: 14 Information Obtained From Patient Wound History Do you currently have one or more open woundso No Have you tested positive for osteomyelitis (bone infection)o No Have you had any tests for circulation on your legso No Constitutional Symptoms (General Health) Complaints and Symptoms: Negative for: Fever; Chills Eyes Medical History: Negative for: Cataracts; Glaucoma; Optic Neuritis Ear/Nose/Mouth/Throat Medical History: Negative for: Chronic sinus problems/congestion; Middle ear problems Hematologic/Lymphatic Medical History: Negative for: Anemia; Hemophilia; Human Immunodeficiency Virus; Lymphedema Respiratory Complaints and Symptoms: No Complaints or Symptoms Medical History: Negative for: Aspiration; Asthma; Chronic Obstructive Pulmonary Disease (COPD); Pneumothorax; Sleep Apnea; Tuberculosis Cardiovascular Complaints and Symptoms: No Complaints or Symptoms Medical History: Positive for: Hypertension Negative for: Angina; Arrhythmia; Congestive Heart Failure; Coronary Artery Disease; Deep Vein Thrombosis; Hypotension; Myocardial Infarction; Peripheral Arterial Disease; Peripheral Venous Disease; Phlebitis; Vasculitis Gastrointestinal [name], [name] ([id number]) Medical History: Negative for: Cirrhosis ; Colitis; Crohnos; Hepatitis A; Hepatitis B; Hepatitis C Endocrine Medical History: Positive for: Type II Diabetes Negative for: Type I Diabetes Time with diabetes:  1990 Treated with: Insulin Blood sugar tested every day: Yes Tested : Genitourinary Medical History: Negative for: End Stage Renal Disease Immunological Medical History: Negative for: Lupus Erythematosus; Raynaudos; Scleroderma Integumentary (Skin) Medical History: Negative for: History of Burn; History of pressure wounds Musculoskeletal Medical History: Negative for: Gout; Rheumatoid Arthritis; Osteoarthritis; Osteomyelitis Neurologic Medical History: Negative for: Dementia; Neuropathy; Quadriplegia; Paraplegia; Seizure Disorder Oncologic Medical History: Negative for: Received Chemotherapy; Received Radiation Psychiatric Complaints and Symptoms: No Complaints or Symptoms Immunizations Pneumococcal Vaccine: Received Pneumococcal Vaccination: Yes Implantable Devices No devices added Family and Social History Cancer: No; Diabetes: Yes - Mother; Heart Disease: Yes - Father; Hereditary Spherocytosis: No; Hypertension: No; Kidney Disease: No; Lung Disease: No; Seizures: No; Stroke: Yes - Father; Thyroid Problems: No; Tuberculosis: No; Never smoker; Marital Status - Widowed; Alcohol Use: Never; Drug Use: No History; Caffeine Use: Moderate; Financial Concerns: No; [name], [name] ([id number]) Food,  Clothing or Shelter Needs: No; Support System Lacking: No; Transportation Concerns: No; Advanced Directives: Yes (Not Provided); Patient does not want information on Advanced Directives; Do not resuscitate: Yes (Not Provided); Living Will: Yes (Not Provided); Medical Power of Attorney: Yes (Not Provided) Physician Affirmation I have reviewed and agree with the above information. Electronic Signature(s) Signed: 03/24/2019 4:16:42 PM By: Worthy Keeler PA-C Signed: 03/24/2019 4:37:01 PM By: Montey Hora Entered By: Worthy Keeler on 03/24/2019 09:58:00 Manganiello, [name]  ([id number]) -------------------------------------------------------------------------------- SuperBill Details Patient Name: [name] Date of Service: 03/24/2019 Medical Record Number: [id number] Patient Account Number: [id number] Date of Birth/Sex: June 24, 1946 (73 y.o. 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

## 2019-03-31 ENCOUNTER — Other Ambulatory Visit: Payer: Self-pay

## 2019-03-31 ENCOUNTER — Encounter: Payer: Medicare Other | Attending: Physician Assistant | Admitting: Physician Assistant

## 2019-03-31 DIAGNOSIS — Z8249 Family history of ischemic heart disease and other diseases of the circulatory system: Secondary | ICD-10-CM | POA: Diagnosis not present

## 2019-03-31 DIAGNOSIS — L97522 Non-pressure chronic ulcer of other part of left foot with fat layer exposed: Secondary | ICD-10-CM | POA: Insufficient documentation

## 2019-03-31 DIAGNOSIS — I10 Essential (primary) hypertension: Secondary | ICD-10-CM | POA: Insufficient documentation

## 2019-03-31 DIAGNOSIS — Z8673 Personal history of transient ischemic attack (TIA), and cerebral infarction without residual deficits: Secondary | ICD-10-CM | POA: Diagnosis not present

## 2019-03-31 DIAGNOSIS — E11621 Type 2 diabetes mellitus with foot ulcer: Secondary | ICD-10-CM | POA: Diagnosis not present

## 2019-03-31 DIAGNOSIS — E1140 Type 2 diabetes mellitus with diabetic neuropathy, unspecified: Secondary | ICD-10-CM | POA: Insufficient documentation

## 2019-03-31 NOTE — Progress Notes (Signed)
IVIS, HENNEMAN ([id number]) Visit Report for 03/31/2019 Chief Complaint Document Details Patient Name: [name], [name]. Date of Service: 03/31/2019 8:15 AM Medical Record Number: [id number] Patient Account Number: [id number] Date of Birth/Sex: September 18, 1946 (73 y.o. M) Treating RN: Montey Hora Primary Care Provider: Glendon Axe Other Clinician: Referring Provider: Glendon Axe Treating Provider/Extender: STONE III, HOYT Weeks in Treatment: 15 Information Obtained from: Patient Chief Complaint Left great toe ulcer Electronic Signature(s) Signed: 03/31/2019 3:18:49 PM By: Worthy Keeler PA-C Entered By: Worthy Keeler on 03/31/2019 08:23:31 [place], [name] ([id number]) -------------------------------------------------------------------------------- Debridement Details Patient Name: [name]. Date of Service: 03/31/2019 8:15 AM Medical Record Number: [id number] Patient Account Number: [id number] Date of Birth/Sex: 1946/01/12 (73 y.o. M) Treating RN: Montey Hora Primary Care Provider: Glendon Axe Other Clinician: Referring Provider: Glendon Axe Treating Provider/Extender: STONE III, HOYT Weeks in Treatment: 15 Debridement Performed for Wound #1R Left Toe Great Assessment: Performed By: Physician STONE III, HOYT E., PA-C Debridement Type: Debridement Severity of Tissue Pre Fat layer exposed Debridement: Level of Consciousness (Pre- Awake and Alert procedure): Pre-procedure Verification/Time Yes - 08:42 Out Taken: Start Time: 08:42 Pain Control: Lidocaine 4% Topical Solution Total Area Debrided (L x W): 0.5 (cm) x 0.4 (cm) = 0.2 (cm) Tissue and other material Viable, Non-Viable, Callus, Slough, Subcutaneous, Slough debrided: Level: Skin/Subcutaneous Tissue Debridement Description: Excisional Instrument: Curette Bleeding: Minimum Hemostasis Achieved: Pressure End Time: 08:46 Procedural Pain: 0 Post Procedural Pain:  0 Response to Treatment: Procedure was tolerated well Level of Consciousness Awake and Alert (Post-procedure): Post Debridement Measurements of Total Wound Length: (cm) 0.5 Width: (cm) 0.4 Depth: (cm) 0.3 Volume: (cm) 0.047 Character of Wound/Ulcer Post Debridement: Improved Severity of Tissue Post Debridement: Fat layer exposed Post Procedure Diagnosis Same as Pre-procedure Electronic Signature(s) Signed: 03/31/2019 3:08:04 PM By: Montey Hora Signed: 03/31/2019 3:18:49 PM By: Worthy Keeler PA-C Entered By: Montey Hora on 03/31/2019 08:46:32 [place], [name] ([id number]) -------------------------------------------------------------------------------- HPI Details Patient Name: [name] Date of Service: 03/31/2019 8:15 AM Medical Record Number: [id number] Patient Account Number: [id number] Date of Birth/Sex: December 06, 1946 (73 y.o. M) Treating RN: Montey Hora Primary Care Provider: Glendon Axe Other Clinician: Referring Provider: Glendon Axe Treating Provider/Extender: Melburn Hake, HOYT Weeks in Treatment: 15 History of Present Illness HPI Description: 12/12/18 on evaluation today patient presents as a referral from his endocrinologist regarding issues that he has been having with his left great toe. Subsequently he has been seeing Dr. Cleda Mccreedy and Dr. Cleda Mccreedy has been the breeding the wound and in fact this was debrided this morning. The patient had an appointment there prior to coming here. Subsequently he states that even though he's been going for regular debridement after office things really do not seem to be showing signs of improvement unfortunately. He states that he is having some discomfort but nothing too significant. No fevers, chills, nausea, or vomiting noted at this time. The patient does have a history of hypertension, neuropathy, diabetes mellitus type II, and a history of stroke. Currently he's been using bacitracin on the toe and utilizing a  Band-Aid. He tells me he's had this wound for two years. He cannot remember the last time he had an x-ray. 12/19/18 on evaluation today patient appears to be doing well in regard to his plantar toe ulcer. He's been tolerating the dressing changes without complication. Fortunately there does not appear to be signs of infection at this time which is good news. No fever chills noted. 01/19/19 has been one month since I last  saw the patient as he was out of town. With that being said on evaluation today it does appear that he is going to require sharp debridement and I do believe that the total contact cast would benefit him as far as being applied at this time. He is in agreement that plan. 01/23/19 on evaluation today patient appears to be doing better in regard to his left great toe ulcer. He has been shown signs of improvement week by week and although this is slow it does seem to be doing fairly well which is good news. Overall very pleased in this regard. 01/31/19 on evaluation today patient presents for follow-up concerning his great toe ulcer. He was actually supposed to be here yesterday and he did come however he ended up having to leave due to his blood sugar dropping he states he just did not eat enough lunch. Fortunately seems to be doing much better today which is excellent news. Fortunately there's no evidence of infection at this time which is also good news. No fevers, chills, nausea, or vomiting noted at this time. Overall I feel like he is making excellent progress. 02/06/19 on evaluation today patient actually appears to be doing very well in regard to his plantar toe ulcer. He's been tolerating the dressing changes without complication. Fortunately there is no sign of infection at this time. Overall been very pleased with how things seem to be progressing. No fevers, chills, nausea, or vomiting noted at this time. 02/13/19 on evaluation today patient actually appears to be doing  excellent in regard to his toe ulcer which is almost completely close. Overall very pleased with that. There does not appear to be any signs of infection which is good news. Upon close inspection it appears that he has just a very slight opening still noted at the central portion of the toe although this is minimal. 02/16/19 on evaluation today patient is seen for early follow-up due to the fact that he tells me while at home he actually got up to go answer the door without putting on the walking boot part of his cast and subsequently damaged the total contact cast. It therefore comes in to have this removed and potentially replaced. Fortunately other than it given him a little bit of pain its far as pushing in on some areas there doesn't appear to be any injury once the cast was removed. 03/13/19 on evaluation today patient unfortunately presents for follow-up concerning his great toe ulcer on the left. He was discharged on 02/16/19 with the wound healed at that point. He tells me this remain so for several weeks or least a couple weeks before reopening. Fortunately there does not appear to be any signs of infection at this time. Nonetheless for the past week at least he's had the wound reopened and states it is been trying to keep pressure off of this but he has not been using the offloading shoe we previously gave him. Fortunately there's no evidence of infection at this time. No fevers, chills, nausea, or vomiting noted at this time. CASTOR, GITTLEMAN ([id number]) 03/17/19 on evaluation today patient actually appears to be doing rather well in regard to his toe also. Fact this appears to be significantly smaller this week even compared to what I saw last week on evaluation. He seems to have done very well for the offloading shoe and overall I'm very pleased with the progress that is made. It may be that he doesn't even need the cast to  get this to heal if he offloads this  appropriately. 03/24/19 on evaluation today patient actually appears to be doing well in regard to his plantar foot ulcer. He's been tolerating the dressing changes without complication the collagen does seem to be beneficial for him. With that being said he is gonna require some miles sharp debridement today to clear away some of the callous's around the edge of the wound as well as the minimal Slough noted on the surface of the wound. 03/31/19 on evaluation today patient appears to be doing well in regard to his plantar great toe ulcer. He has been tolerating the dressing changes without complication which is good news. Fortunately there does not appear to be any signs of active infection at this time. Overall very pleased with how things seem to be progressing. Electronic Signature(s) Signed: 03/31/2019 3:18:49 PM By: Worthy Keeler PA-C Entered By: Worthy Keeler on 03/31/2019 09:36:52 [place], [name] ([id number]) -------------------------------------------------------------------------------- Physical Exam Details Patient Name: [name] Date of Service: 03/31/2019 8:15 AM Medical Record Number: [id number] Patient Account Number: [id number] Date of Birth/Sex: 07/05/1946 (73 y.o. M) Treating RN: Montey Hora Primary Care Provider: Glendon Axe Other Clinician: Referring Provider: Glendon Axe Treating Provider/Extender: STONE III, HOYT Weeks in Treatment: 27 Constitutional Well-nourished and well-hydrated in no acute distress. Respiratory normal breathing without difficulty. Psychiatric this patient is able to make decisions and demonstrates good insight into disease process. Alert and Oriented x 3. pleasant and cooperative. Notes Patient's wound bed did require some sharp debridement today to remove the callous and slough from the surface of the wound he tolerated this without complication. Post debridement wound bed appears to be doing significantly better  which is excellent news. Electronic Signature(s) Signed: 03/31/2019 3:18:49 PM By: Worthy Keeler PA-C Entered By: Worthy Keeler on 03/31/2019 09:37:18 [place], [name] ([id number]) -------------------------------------------------------------------------------- Physician Orders Details Patient Name: [name] Date of Service: 03/31/2019 8:15 AM Medical Record Number: [id number] Patient Account Number: [id number] Date of Birth/Sex: [birth year] (73 y.o. M) Treating RN: Montey Hora Primary Care Provider: Glendon Axe Other Clinician: Referring Provider: Glendon Axe Treating Provider/Extender: Melburn Hake, HOYT Weeks in Treatment: 15 Verbal / Phone Orders: No Diagnosis Coding ICD-10 Coding Code Description E11.621 Type 2 diabetes mellitus with foot ulcer L97.522 Non-pressure chronic ulcer of other part of left foot with fat layer exposed I10 Essential (primary) hypertension E11.43 Type 2 diabetes mellitus with diabetic autonomic (poly)neuropathy Wound Cleansing Wound #1R Left Toe Great o Clean wound with Normal Saline. o May Shower, gently pat wound dry prior to applying new dressing. Primary Wound Dressing Wound #1R Left Toe Great o Silver Collagen Secondary Dressing Wound #1R Left Toe Great o Conform/Kerlix o Foam Dressing Change Frequency Wound #1R Left Toe Great o Change dressing every other day. Follow-up Appointments Wound #1R Left Toe Great o Return Appointment in 1 week. Off-Loading Wound #1R Left Toe Great o Open toe surgical shoe to: - left foot, felt surrounding wound Electronic Signature(s) Signed: 03/31/2019 3:08:04 PM By: Montey Hora Signed: 03/31/2019 3:18:49 PM By: Worthy Keeler PA-C Entered By: Montey Hora on 03/31/2019 08:47:30 [place], [name] ([id number]) -------------------------------------------------------------------------------- Problem List Details Patient Name: GUIDO, COMP. Date of  Service: 03/31/2019 8:15 AM Medical Record Number: [id number] Patient Account Number: [id number] Date of Birth/Sex: January 02, 1946 (73 y.o. M) Treating RN: Montey Hora Primary Care Provider: Glendon Axe Other Clinician: Referring Provider: Glendon Axe Treating Provider/Extender: STONE III, HOYT Weeks in Treatment: 15 Active Problems ICD-10 Evaluated Encounter Code  Description Active Date Today Diagnosis E11.621 Type 2 diabetes mellitus with foot ulcer 12/12/2018 No Yes L97.522 Non-pressure chronic ulcer of other part of left foot with fat 12/12/2018 No Yes layer exposed I10 Essential (primary) hypertension 12/12/2018 No Yes E11.43 Type 2 diabetes mellitus with diabetic autonomic (poly) 12/12/2018 No Yes neuropathy Inactive Problems Resolved Problems Electronic Signature(s) Signed: 03/31/2019 3:18:49 PM By: Worthy Keeler PA-C Entered By: Worthy Keeler on 03/31/2019 08:23:19 [place], [name] ([id number]) -------------------------------------------------------------------------------- Progress Note Details Patient Name: [name] Date of Service: 03/31/2019 8:15 AM Medical Record Number: [id number] Patient Account Number: [id number] Date of Birth/Sex: September 13, 1946 (73 y.o. M) Treating RN: Montey Hora Primary Care Provider: Glendon Axe Other Clinician: Referring Provider: Glendon Axe Treating Provider/Extender: Melburn Hake, HOYT Weeks in Treatment: 15 Subjective Chief Complaint Information obtained from Patient Left great toe ulcer History of Present Illness (HPI) 12/12/18 on evaluation today patient presents as a referral from his endocrinologist regarding issues that he has been having with his left great toe. Subsequently he has been seeing Dr. Cleda Mccreedy and Dr. Cleda Mccreedy has been the breeding the wound and in fact this was debrided this morning. The patient had an appointment there prior to coming here. Subsequently he states that even though he's been  going for regular debridement after office things really do not seem to be showing signs of improvement unfortunately. He states that he is having some discomfort but nothing too significant. No fevers, chills, nausea, or vomiting noted at this time. The patient does have a history of hypertension, neuropathy, diabetes mellitus type II, and a history of stroke. Currently he's been using bacitracin on the toe and utilizing a Band-Aid. He tells me he's had this wound for two years. He cannot remember the last time he had an x-ray. 12/19/18 on evaluation today patient appears to be doing well in regard to his plantar toe ulcer. He's been tolerating the dressing changes without complication. Fortunately there does not appear to be signs of infection at this time which is good news. No fever chills noted. 01/19/19 has been one month since I last saw the patient as he was out of town. With that being said on evaluation today it does appear that he is going to require sharp debridement and I do believe that the total contact cast would benefit him as far as being applied at this time. He is in agreement that plan. 01/23/19 on evaluation today patient appears to be doing better in regard to his left great toe ulcer. He has been shown signs of improvement week by week and although this is slow it does seem to be doing fairly well which is good news. Overall very pleased in this regard. 01/31/19 on evaluation today patient presents for follow-up concerning his great toe ulcer. He was actually supposed to be here yesterday and he did come however he ended up having to leave due to his blood sugar dropping he states he just did not eat enough lunch. Fortunately seems to be doing much better today which is excellent news. Fortunately there's no evidence of infection at this time which is also good news. No fevers, chills, nausea, or vomiting noted at this time. Overall I feel like he is making excellent  progress. 02/06/19 on evaluation today patient actually appears to be doing very well in regard to his plantar toe ulcer. He's been tolerating the dressing changes without complication. Fortunately there is no sign of infection at this time. Overall been very pleased with  how things seem to be progressing. No fevers, chills, nausea, or vomiting noted at this time. 02/13/19 on evaluation today patient actually appears to be doing excellent in regard to his toe ulcer which is almost completely close. Overall very pleased with that. There does not appear to be any signs of infection which is good news. Upon close inspection it appears that he has just a very slight opening still noted at the central portion of the toe although this is minimal. 02/16/19 on evaluation today patient is seen for early follow-up due to the fact that he tells me while at home he actually got up to go answer the door without putting on the walking boot part of his cast and subsequently damaged the total contact cast. It therefore comes in to have this removed and potentially replaced. Fortunately other than it given him a little bit of pain its far as pushing in on some areas there doesn't appear to be any injury once the cast was removed. JAVYN, HAVLIN ([id number]) 03/13/19 on evaluation today patient unfortunately presents for follow-up concerning his great toe ulcer on the left. He was discharged on 02/16/19 with the wound healed at that point. He tells me this remain so for several weeks or least a couple weeks before reopening. Fortunately there does not appear to be any signs of infection at this time. Nonetheless for the past week at least he's had the wound reopened and states it is been trying to keep pressure off of this but he has not been using the offloading shoe we previously gave him. Fortunately there's no evidence of infection at this time. No fevers, chills, nausea, or vomiting noted at this  time. 03/17/19 on evaluation today patient actually appears to be doing rather well in regard to his toe also. Fact this appears to be significantly smaller this week even compared to what I saw last week on evaluation. He seems to have done very well for the offloading shoe and overall I'm very pleased with the progress that is made. It may be that he doesn't even need the cast to get this to heal if he offloads this appropriately. 03/24/19 on evaluation today patient actually appears to be doing well in regard to his plantar foot ulcer. He's been tolerating the dressing changes without complication the collagen does seem to be beneficial for him. With that being said he is gonna require some miles sharp debridement today to clear away some of the callous's around the edge of the wound as well as the minimal Slough noted on the surface of the wound. 03/31/19 on evaluation today patient appears to be doing well in regard to his plantar great toe ulcer. He has been tolerating the dressing changes without complication which is good news. Fortunately there does not appear to be any signs of active infection at this time. Overall very pleased with how things seem to be progressing. Patient History Information obtained from Patient. Family History Diabetes - Mother, Heart Disease - Father, Stroke - Father, No family history of Cancer, Hereditary Spherocytosis, Hypertension, Kidney Disease, Lung Disease, Seizures, Thyroid Problems, Tuberculosis. Social History Never smoker, Marital Status - Widowed, Alcohol Use - Never, Drug Use - No History, Caffeine Use - Moderate. Medical History Eyes Denies history of Cataracts, Glaucoma, Optic Neuritis Ear/Nose/Mouth/Throat Denies history of Chronic sinus problems/congestion, Middle ear problems Hematologic/Lymphatic Denies history of Anemia, Hemophilia, Human Immunodeficiency Virus, Lymphedema Respiratory Denies history of Aspiration, Asthma, Chronic  Obstructive Pulmonary Disease (COPD), Pneumothorax, Sleep  Apnea, Tuberculosis Cardiovascular Patient has history of Hypertension Denies history of Angina, Arrhythmia, Congestive Heart Failure, Coronary Artery Disease, Deep Vein Thrombosis, Hypotension, Myocardial Infarction, Peripheral Arterial Disease, Peripheral Venous Disease, Phlebitis, Vasculitis Gastrointestinal Denies history of Cirrhosis , Colitis, Crohn s, Hepatitis A, Hepatitis B, Hepatitis C Endocrine Patient has history of Type II Diabetes Denies history of Type I Diabetes Genitourinary Denies history of End Stage Renal Disease Immunological Denies history of Lupus Erythematosus, Raynaud s, Scleroderma Integumentary (Skin) Denies history of History of Burn, History of pressure wounds Musculoskeletal Denies history of Gout, Rheumatoid Arthritis, Osteoarthritis, Osteomyelitis [place], [name] ([id number]) Neurologic Denies history of Dementia, Neuropathy, Quadriplegia, Paraplegia, Seizure Disorder Oncologic Denies history of Received Chemotherapy, Received Radiation Review of Systems (ROS) Constitutional Symptoms (General Health) Denies complaints or symptoms of Fever, Chills. Respiratory The patient has no complaints or symptoms. Cardiovascular The patient has no complaints or symptoms. Psychiatric The patient has no complaints or symptoms. Objective Constitutional Well-nourished and well-hydrated in no acute distress. Vitals Time Taken: 8:16 AM, Height: 72 in, Weight: 250 lbs, BMI: 33.9, Temperature: 98.5 F, Pulse: 68 bpm, Respiratory Rate: 16 breaths/min, Blood Pressure: 172/77 mmHg. Respiratory normal breathing without difficulty. Psychiatric this patient is able to make decisions and demonstrates good insight into disease process. Alert and Oriented x 3. pleasant and cooperative. General Notes: Patient's wound bed did require some sharp debridement today to remove the callous and slough from  the surface of the wound he tolerated this without complication. Post debridement wound bed appears to be doing significantly better which is excellent news. Integumentary (Hair, Skin) Wound #1R status is Open. Original cause of wound was Gradually Appeared. The wound is located on the Left Toe Great. The wound measures 0.5cm length x 0.4cm width x 0.2cm depth; 0.157cm^2 area and 0.031cm^3 volume. There is Fat Layer (Subcutaneous Tissue) Exposed exposed. There is no tunneling or undermining noted. There is a small amount of serosanguineous drainage noted. The wound margin is thickened. There is large (67-100%) pale granulation within the wound bed. There is a small (1-33%) amount of necrotic tissue within the wound bed including Adherent Slough. The periwound skin appearance exhibited: Callus, Maceration, Ecchymosis. The periwound skin appearance did not exhibit: Crepitus, Excoriation, Induration, Rash, Scarring, Dry/Scaly, Atrophie Blanche, Cyanosis, Hemosiderin Staining, Mottled, Pallor, Rubor, Erythema. Periwound temperature was noted as No Abnormality. The periwound has tenderness on palpation. WM, SAHAGUN ([id number]) Assessment Active Problems ICD-10 Type 2 diabetes mellitus with foot ulcer Non-pressure chronic ulcer of other part of left foot with fat layer exposed Essential (primary) hypertension Type 2 diabetes mellitus with diabetic autonomic (poly)neuropathy Procedures Wound #1R Pre-procedure diagnosis of Wound #1R is a Diabetic Wound/Ulcer of the Lower Extremity located on the Left Toe Great .Severity of Tissue Pre Debridement is: Fat layer exposed. There was a Excisional Skin/Subcutaneous Tissue Debridement with a total area of 0.2 sq cm performed by STONE III, HOYT E., PA-C. With the following instrument(s): Curette to remove Viable and Non-Viable tissue/material. Material removed includes Callus, Subcutaneous Tissue, and Slough after achieving pain control using  Lidocaine 4% Topical Solution. No specimens were taken. A time out was conducted at 08:42, prior to the start of the procedure. A Minimum amount of bleeding was controlled with Pressure. The procedure was tolerated well with a pain level of 0 throughout and a pain level of 0 following the procedure. Post Debridement Measurements: 0.5cm length x 0.4cm width x 0.3cm depth; 0.047cm^3 volume. Character of Wound/Ulcer Post Debridement is improved. Severity of Tissue Post Debridement  is: Fat layer exposed. Post procedure Diagnosis Wound #1R: Same as Pre-Procedure Plan Wound Cleansing: Wound #1R Left Toe Great: Clean wound with Normal Saline. May Shower, gently pat wound dry prior to applying new dressing. Primary Wound Dressing: Wound #1R Left Toe Great: Silver Collagen Secondary Dressing: Wound #1R Left Toe Great: Conform/Kerlix Foam Dressing Change Frequency: Wound #1R Left Toe Great: Change dressing every other day. Follow-up Appointments: Wound #1R Left Toe Great: Return Appointment in 1 week. Off-Loading: Wound #1R Left Toe Great: Open toe surgical shoe to: - left foot, felt surrounding wound [place], [name] ([id number]) My suggestion currently is gonna be that we go ahead and initiate the above wound care measures to be continued over the next week and the patient is in agreement with that plan. Anything changes worsens meantime he will contact the office and let me know. Otherwise my hope is that there will be continued an ongoing improvement in regard to this toe ulcer I did advise him as well with his offloading shoe that he needs to make sure not to allow the toad to tap the ground that is defeating the purpose of what the she was attempting to do. Please see above for specific wound care orders. We will see patient for re-evaluation in 1 week(s) here in the clinic. If anything worsens or changes patient will contact our office for additional recommendations. Electronic  Signature(s) Signed: 03/31/2019 3:18:49 PM By: Worthy Keeler PA-C Entered By: Worthy Keeler on 03/31/2019 09:37:46 [place], [name] ([id number]) -------------------------------------------------------------------------------- ROS/PFSH Details Patient Name: [name] Date of Service: 03/31/2019 8:15 AM Medical Record Number: [id number] Patient Account Number: [id number] Date of Birth/Sex: [birth year] (73 y.o. M) Treating RN: Montey Hora Primary Care Provider: Glendon Axe Other Clinician: Referring Provider: Glendon Axe Treating Provider/Extender: STONE III, HOYT Weeks in Treatment: 15 Information Obtained From Patient Wound History Do you currently have one or more open woundso No Have you tested positive for osteomyelitis (bone infection)o No Have you had any tests for circulation on your legso No Constitutional Symptoms (General Health) Complaints and Symptoms: Negative for: Fever; Chills Eyes Medical History: Negative for: Cataracts; Glaucoma; Optic Neuritis Ear/Nose/Mouth/Throat Medical History: Negative for: Chronic sinus problems/congestion; Middle ear problems Hematologic/Lymphatic Medical History: Negative for: Anemia; Hemophilia; Human Immunodeficiency Virus; Lymphedema Respiratory Complaints and Symptoms: No Complaints or Symptoms Medical History: Negative for: Aspiration; Asthma; Chronic Obstructive Pulmonary Disease (COPD); Pneumothorax; Sleep Apnea; Tuberculosis Cardiovascular Complaints and Symptoms: No Complaints or Symptoms Medical History: Positive for: Hypertension Negative for: Angina; Arrhythmia; Congestive Heart Failure; Coronary Artery Disease; Deep Vein Thrombosis; Hypotension; Myocardial Infarction; Peripheral Arterial Disease; Peripheral Venous Disease; Phlebitis; Vasculitis Gastrointestinal RUEBEN, KASSIM ([id number]) Medical History: Negative for: Cirrhosis ; Colitis; Crohnos; Hepatitis A; Hepatitis B;  Hepatitis C Endocrine Medical History: Positive for: Type II Diabetes Negative for: Type I Diabetes Time with diabetes: 1990 Treated with: Insulin Blood sugar tested every day: Yes Tested : Genitourinary Medical History: Negative for: End Stage Renal Disease Immunological Medical History: Negative for: Lupus Erythematosus; Raynaudos; Scleroderma Integumentary (Skin) Medical History: Negative for: History of Burn; History of pressure wounds Musculoskeletal Medical History: Negative for: Gout; Rheumatoid Arthritis; Osteoarthritis; Osteomyelitis Neurologic Medical History: Negative for: Dementia; Neuropathy; Quadriplegia; Paraplegia; Seizure Disorder Oncologic Medical History: Negative for: Received Chemotherapy; Received Radiation Psychiatric Complaints and Symptoms: No Complaints or Symptoms Immunizations Pneumococcal Vaccine: Received Pneumococcal Vaccination: Yes Implantable Devices No devices added Family and Social History Cancer: No; Diabetes: Yes - Mother; Heart Disease: Yes - Father; Hereditary Spherocytosis: No; Hypertension:  No; Kidney Disease: No; Lung Disease: No; Seizures: No; Stroke: Yes - Father; Thyroid Problems: No; Tuberculosis: No; Never smoker; Marital Status - Widowed; Alcohol Use: Never; Drug Use: No History; Caffeine Use: Moderate; Financial Concerns: No; HENDRICK, PAVICH ([id number]) Food, Clothing or Shelter Needs: No; Support System Lacking: No; Transportation Concerns: No; Advanced Directives: Yes (Not Provided); Patient does not want information on Advanced Directives; Do not resuscitate: Yes (Not Provided); Living Will: Yes (Not Provided); Medical Power of Attorney: Yes (Not Provided) Physician Affirmation I have reviewed and agree with the above information. Electronic Signature(s) Signed: 03/31/2019 3:08:04 PM By: Montey Hora Signed: 03/31/2019 3:18:49 PM By: Worthy Keeler PA-C Entered By: Worthy Keeler on 03/31/2019  09:37:05 [place], [name] ([id number]) -------------------------------------------------------------------------------- SuperBill Details Patient Name: [name] Date of Service: 03/31/2019 Medical Record Number: [id number] Patient Account Number: [id number] Date of Birth/Sex: 27-Sep-1946 (73 y.o. 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

## 2019-04-07 ENCOUNTER — Ambulatory Visit: Payer: Non-veteran care | Admitting: Physician Assistant

## 2019-04-11 ENCOUNTER — Other Ambulatory Visit: Payer: Self-pay

## 2019-04-11 ENCOUNTER — Encounter: Payer: Medicare Other | Admitting: Physician Assistant

## 2019-04-11 DIAGNOSIS — E11621 Type 2 diabetes mellitus with foot ulcer: Secondary | ICD-10-CM | POA: Diagnosis not present

## 2019-04-11 NOTE — Progress Notes (Signed)
[name], CORRALES ([id number]) Visit Report for 04/11/2019 Arrival Information Details Patient Name: [name], [name]. Date of Service: 04/11/2019 8:00 AM Medical Record Number: [id number] Patient Account Number: [id number] Date of Birth/Sex: [birth year] (73 y.o. M) Treating RN: Cornell Barman Primary Care Trase Bunda: Glendon Axe Other Clinician: Referring Petronella Shuford: Glendon Axe Treating Graham Doukas/Extender: Melburn Hake, HOYT Weeks in Treatment: 98 Visit Information History Since Last Visit Added or deleted any medications: No Patient Arrived: Ambulatory Any new allergies or adverse reactions: No Arrival Time: 08:04 Had a fall or experienced change in Yes Accompanied By: self activities of daily living that may affect Transfer Assistance: None risk of falls: Patient Identification Verified: Yes Signs or symptoms of abuse/neglect since last visito No Secondary Verification Process Completed: Yes Hospitalized since last visit: No Patient Has Alerts: Yes Implantable device outside of the clinic excluding No Patient Alerts: DMII cellular tissue based products placed in the center since last visit: Has Dressing in Place as Prescribed: Yes Pain Present Now: No Electronic Signature(s) Signed: 04/11/2019 4:24:58 PM By: Gretta Cool, BSN, RN, CWS, Kim RN, BSN Entered By: Gretta Cool, BSN, RN, CWS, Kim on 04/11/2019 08:05:31 [place], [name] ([id number]) -------------------------------------------------------------------------------- Encounter Discharge Information Details Patient Name: [name]. Date of Service: 04/11/2019 8:00 AM Medical Record Number: [id number] Patient Account Number: [id number] Date of Birth/Sex: [birth year] (73 y.o. M) Treating RN: Montey Hora Primary Care Annamae Shivley: Glendon Axe Other Clinician: Referring Tarl Cephas: Glendon Axe Treating Kateryn Marasigan/Extender: STONE III, HOYT Weeks in Treatment: 17 Encounter Discharge Information Items Post Procedure  Vitals Discharge Condition: Stable Temperature (F): 97.9 Ambulatory Status: Ambulatory Pulse (bpm): 76 Discharge Destination: Home Respiratory Rate (breaths/min): 16 Transportation: Private Auto Blood Pressure (mmHg): 153/69 Accompanied By: self Schedule Follow-up Appointment: Yes Clinical Summary of Care: Electronic Signature(s) Signed: 04/11/2019 12:59:05 PM By: Montey Hora Entered By: Montey Hora on 04/11/2019 08:32:11 [name] ([id number]) -------------------------------------------------------------------------------- Lower Extremity Assessment Details Patient Name: [name]. Date of Service: 04/11/2019 8:00 AM Medical Record Number: [id number] Patient Account Number: [id number] Date of Birth/Sex: 04-08-1946 (73 y.o. M) Treating RN: Cornell Barman Primary Care Nilaya Bouie: Glendon Axe Other Clinician: Referring Daniyla Pfahler: Glendon Axe Treating Jamai Dolce/Extender: STONE III, HOYT Weeks in Treatment: 17 Edema Assessment Assessed: [Left: No] [Right: No] Edema: [Left: N] [Right: o] Vascular Assessment Pulses: Dorsalis Pedis Palpable: [Left:Yes] Electronic Signature(s) Signed: 04/11/2019 4:24:58 PM By: Gretta Cool, BSN, RN, CWS, Kim RN, BSN Entered By: Gretta Cool, BSN, RN, CWS, Kim on 04/11/2019 08:13:09 [name] ([id number]) -------------------------------------------------------------------------------- Multi Wound Chart Details Patient Name: [name]. Date of Service: 04/11/2019 8:00 AM Medical Record Number: [id number] Patient Account Number: [id number] Date of Birth/Sex: 09-02-1946 (73 y.o. M) Treating RN: Montey Hora Primary Care Gerald Honea: Glendon Axe Other Clinician: Referring Suheyla Mortellaro: Glendon Axe Treating Rhiley Tarver/Extender: STONE III, HOYT Weeks in Treatment: 17 Vital Signs Height(in): 72 Pulse(bpm): 76 Weight(lbs): 250 Blood Pressure(mmHg): 153/69 Body Mass Index(BMI): 34 Temperature(F):  97.9 Respiratory Rate 16 (breaths/min): Photos: [N/A:N/A] Wound Location: Left Toe Great N/A N/A Wounding Event: Gradually Appeared N/A N/A Primary Etiology: Diabetic Wound/Ulcer of the N/A N/A Lower Extremity Comorbid History: Hypertension, Type II Diabetes N/A N/A Date Acquired: 11/30/2017 N/A N/A Weeks of Treatment: 17 N/A N/A Wound Status: Open N/A N/A Wound Recurrence: Yes N/A N/A Measurements L x W x D 0.3x0.4x0.3 N/A N/A (cm) Area (cm) : 0.094 N/A N/A Volume (cm) : 0.028 N/A N/A % Reduction in Area: 85.20% N/A N/A % Reduction in Volume: 56.30% N/A N/A Classification: Grade 2 N/A N/A Exudate Amount: Medium N/A N/A Exudate Type: Serosanguineous  N/A N/A Exudate Color: red, brown N/A N/A Wound Margin: Thickened N/A N/A Granulation Amount: Large (67-100%) N/A N/A Granulation Quality: Pale N/A N/A Necrotic Amount: Small (1-33%) N/A N/A Exposed Structures: Fat Layer (Subcutaneous N/A N/A Tissue) Exposed: Yes Fascia: No Tendon: No Muscle: No TERRON, MERFELD ([id number]) Joint: No Bone: No Epithelialization: Large (67-100%) N/A N/A Periwound Skin Texture: Callus: Yes N/A N/A Excoriation: No Induration: No Crepitus: No Rash: No Scarring: No Periwound Skin Moisture: Maceration: No N/A N/A Dry/Scaly: No Periwound Skin Color: Atrophie Blanche: No N/A N/A Cyanosis: No Ecchymosis: No Erythema: No Hemosiderin Staining: No Mottled: No Pallor: No Rubor: No Temperature: No Abnormality N/A N/A Tenderness on Palpation: Yes N/A N/A Treatment Notes Electronic Signature(s) Signed: 04/11/2019 12:59:05 PM By: Montey Hora Entered By: Montey Hora on 04/11/2019 08:26:03 [name] ([id number]) -------------------------------------------------------------------------------- [place] Details Patient Name: [name]. Date of Service: 04/11/2019 8:00 AM Medical Record Number: [id number] Patient Account Number:  [id number] Date of Birth/Sex: Apr 21, 1946 (73 y.o. M) Treating RN: Montey Hora Primary Care Kevionna Heffler: Glendon Axe Other Clinician: Referring Avonna Iribe: Glendon Axe Treating Kamarion Zagami/Extender: STONE III, HOYT Weeks in Treatment: 45 Active Inactive Abuse / Safety / Falls / Self Care Management Nursing Diagnoses: Potential for falls Goals: Patient will remain injury free related to falls Date Initiated: 01/19/2019 Target Resolution Date: 07/01/2019 Goal Status: Active Interventions: Assess fall risk on admission and as needed Notes: Peripheral Neuropathy Nursing Diagnoses: Knowledge deficit related to disease process and management of peripheral neurovascular dysfunction Goals: Patient/caregiver will verbalize understanding of disease process and disease management Date Initiated: 03/17/2019 Target Resolution Date: 06/03/2019 Goal Status: Active Interventions: Assess signs and symptoms of neuropathy upon admission and as needed Provide education on Management of Neuropathy and Related Ulcers Notes: Wound/Skin Impairment Nursing Diagnoses: Impaired tissue integrity Goals: Ulcer/skin breakdown will have a volume reduction of 30% by week 4 Date Initiated: 12/12/2018 Target Resolution Date: 02/25/2019 Goal Status: Active Interventions: ZAIDEN, LUDLUM ([id number]) Assess patient/caregiver ability to obtain necessary supplies Assess patient/caregiver ability to perform ulcer/skin care regimen upon admission and as needed Notes: Electronic Signature(s) Signed: 04/11/2019 12:59:05 PM By: Montey Hora Entered By: Montey Hora on 04/11/2019 70:62:37 [name] ([id number]) -------------------------------------------------------------------------------- Pain Assessment Details Patient Name: [name]. Date of Service: 04/11/2019 8:00 AM Medical Record Number: [id number] Patient Account Number: [id number] Date of Birth/Sex: 09-03-1946 (73 y.o.  M) Treating RN: Cornell Barman Primary Care Ronna Herskowitz: Glendon Axe Other Clinician: Referring Farrell Broerman: Glendon Axe Treating Antoino Westhoff/Extender: STONE III, HOYT Weeks in Treatment: 17 Active Problems Location of Pain Severity and Description of Pain Patient Has Paino No Site Locations Pain Management and Medication Current Pain Management: Notes Patient denies pain at this time. Electronic Signature(s) Signed: 04/11/2019 4:24:58 PM By: Gretta Cool, BSN, RN, CWS, Kim RN, BSN Entered By: Gretta Cool, BSN, RN, CWS, Kim on 04/11/2019 08:05:50 [name] ([id number]) -------------------------------------------------------------------------------- Patient/Caregiver Education Details Patient Name: KOLDEN, DUPEE Date of Service: 04/11/2019 8:00 AM Medical Record Number: [id number] Patient Account Number: [id number] Date of Birth/Gender: [birth year] (73 y.o. M) Treating RN: Montey Hora Primary Care Physician: Glendon Axe Other Clinician: Referring Physician: Glendon Axe Treating Physician/Extender: Sharalyn Ink in Treatment: 17 Education Assessment Education Provided To: Patient Education Topics Provided Wound/Skin Impairment: Handouts: Other: wound care as ordered Methods: Demonstration, Explain/Verbal Responses: State content correctly Electronic Signature(s) Signed: 04/11/2019 12:59:05 PM By: Montey Hora Entered By: Montey Hora on 04/11/2019 08:30:26 [place], [name] ([id number]) -------------------------------------------------------------------------------- Wound Assessment Details Patient Name: [name]. Date of Service:  04/11/2019 8:00 AM Medical Record Number: [id number] Patient Account Number: [id number] Date of Birth/Sex: 08/25/1946 (73 y.o. M) Treating RN: Cornell Barman Primary Care Phuong Hillary: Glendon Axe Other Clinician: Referring Angele Wiemann: Glendon Axe Treating Toney Lizaola/Extender: STONE III, HOYT Weeks in Treatment:  17 Wound Status Wound Number: [id number] Primary Etiology: Diabetic Wound/Ulcer of the Lower Extremity Wound Location: Left Toe Great Wound Status: Open Wounding Event: Gradually Appeared Comorbid Hypertension, Type II Diabetes Date Acquired: 11/30/2017 History: Weeks Of Treatment: 17 Clustered Wound: No Photos Wound Measurements Length: (cm) 0.3 % Reduction in Width: (cm) 0.4 % Reduction in Depth: (cm) 0.3 Epithelializati Area: (cm) 0.094 Tunneling: Volume: (cm) 0.028 Undermining: Area: 85.2% Volume: 56.3% on: Large (67-100%) No No Wound Description Classification: Grade 2 Foul Odor After Wound Margin: Thickened Slough/Fibrino Exudate Amount: Medium Exudate Type: Serosanguineous Exudate Color: red, brown Cleansing: No Yes Wound Bed Granulation Amount: Large (67-100%) Exposed Structure Granulation Quality: Pale Fascia Exposed: No Necrotic Amount: Small (1-33%) Fat Layer (Subcutaneous Tissue) Exposed: Yes Necrotic Quality: Adherent Slough Tendon Exposed: No Muscle Exposed: No Joint Exposed: No Bone Exposed: No Periwound Skin Texture Texture Color Bogdan, Ikey L. ([id number]) No Abnormalities Noted: No No Abnormalities Noted: No Callus: Yes Atrophie Blanche: No Crepitus: No Cyanosis: No Excoriation: No Ecchymosis: No Induration: No Erythema: No Rash: No Hemosiderin Staining: No Scarring: No Mottled: No Pallor: No Moisture Rubor: No No Abnormalities Noted: No Dry / Scaly: No Temperature / Pain Maceration: No Temperature: No Abnormality Tenderness on Palpation: Yes Treatment Notes Wound #1R (Left Toe Great) Notes prisma, foam, conform and tape Electronic Signature(s) Signed: 04/11/2019 4:24:58 PM By: Gretta Cool, BSN, RN, CWS, Kim RN, BSN Entered By: Gretta Cool, BSN, RN, CWS, Kim on 04/11/2019 08:12:45 [place], [name] ([id number]) -------------------------------------------------------------------------------- Vitals Details Patient Name:  [name]. Date of Service: 04/11/2019 8:00 AM Medical Record Number: [id number] Patient Account Number: [id number] Date of Birth/Sex: 24-Jul-1946 (73 y.o. M) Treating RN: Cornell Barman Primary Care Lin Hackmann: Glendon Axe Other Clinician: Referring Mycal Conde: Glendon Axe Treating Aliya Sol/Extender: STONE III, HOYT Weeks in Treatment: 17 Vital Signs Time Taken: 08:06 Temperature (F): 97.9 Height (in): 72 Pulse (bpm): 76 Weight (lbs): 250 Respiratory Rate (breaths/min): 16 Body Mass Index (BMI): 33.9 Blood Pressure (mmHg): 153/69 Reference Range: 80 - 120 mg / dl Electronic Signature(s) Signed: 04/11/2019 4:24:58 PM By: Gretta Cool, BSN, RN, CWS, Kim RN, BSN Entered By: Gretta Cool, BSN, RN, CWS, Kim on 04/11/2019 98:92:11

## 2019-04-11 NOTE — Progress Notes (Signed)
REDELL, NAZIR ([id number]) Visit Report for 04/11/2019 Fall Risk Assessment Details Patient Name: [name], [name]. Date of Service: 04/11/2019 8:00 AM Medical Record Number: [id number] Patient Account Number: [id number] Date of Birth/Sex: 06-Apr-1946 (73 y.o. M) Treating RN: Cornell Barman Primary Care Samra Pesch: Glendon Axe Other Clinician: Referring Treyce Spillers: Glendon Axe Treating Katalina Magri/Extender: Melburn Hake, HOYT Weeks in Treatment: 17 Fall Risk Assessment Items Have you had 2 or more falls in the last 12 monthso 0 No Have you had any fall that resulted in injury in the last 12 monthso 0 No FALL RISK ASSESSMENT: History of falling - immediate or within 3 months 25 Yes Secondary diagnosis 0 No Ambulatory aid None/bed rest/wheelchair/nurse 0 Yes Crutches/cane/walker 0 No Furniture 0 No IV Access/Saline Lock 0 No Gait/Training Normal/bed rest/immobile 0 Yes Weak 0 No Impaired 0 No Mental Status Oriented to own ability 0 Yes Electronic Signature(s) Signed: 04/11/2019 4:24:58 PM By: Gretta Cool, BSN, RN, CWS, Kim RN, BSN Entered By: Gretta Cool, BSN, RN, CWS, Kim on 04/11/2019 08:06:13

## 2019-04-11 NOTE — Progress Notes (Signed)
[name], [name] ([id number]) Visit Report for 04/11/2019 Chief Complaint Document Details Patient Name: [name], [name]. Date of Service: 04/11/2019 8:00 AM Medical Record Number: [id number] Patient Account Number: [id number] Date of Birth/Sex: Mar 23, 1946 (73 y.o. M) Treating RN: Montey Hora Primary Care Provider: Glendon Axe Other Clinician: Referring Provider: Glendon Axe Treating Provider/Extender: Melburn Hake, HOYT Weeks in Treatment: 17 Information Obtained from: Patient Chief Complaint Left great toe ulcer Electronic Signature(s) Signed: 04/11/2019 1:21:29 PM By: Worthy Keeler PA-C Entered By: Worthy Keeler on 04/11/2019 08:15:01 [name], [name] ([id number]) -------------------------------------------------------------------------------- Debridement Details Patient Name: [name]. Date of Service: 04/11/2019 8:00 AM Medical Record Number: [id number] Patient Account Number: [id number] Date of Birth/Sex: 07-04-1946 (73 y.o. M) Treating RN: Montey Hora Primary Care Provider: Glendon Axe Other Clinician: Referring Provider: Glendon Axe Treating Provider/Extender: STONE III, HOYT Weeks in Treatment: 17 Debridement Performed for Wound #1R Left Toe Great Assessment: Performed By: Physician STONE III, HOYT E., PA-C Debridement Type: Debridement Severity of Tissue Pre Fat layer exposed Debridement: Level of Consciousness (Pre- Awake and Alert procedure): Pre-procedure Verification/Time Yes - 08:25 Out Taken: Start Time: 08:25 Pain Control: Lidocaine 4% Topical Solution Total Area Debrided (L x W): 0.3 (cm) x 0.4 (cm) = 0.12 (cm) Tissue and other material Viable, Non-Viable, Callus, Slough, Subcutaneous, Slough debrided: Level: Skin/Subcutaneous Tissue Debridement Description: Excisional Instrument: Curette Bleeding: Minimum Hemostasis Achieved: Pressure End Time: 08:29 Procedural Pain: 0 Post Procedural Pain:  0 Response to Treatment: Procedure was tolerated well Level of Consciousness Awake and Alert (Post-procedure): Post Debridement Measurements of Total Wound Length: (cm) 0.5 Width: (cm) 0.7 Depth: (cm) 0.1 Volume: (cm) 0.027 Character of Wound/Ulcer Post Debridement: Improved Severity of Tissue Post Debridement: Fat layer exposed Post Procedure Diagnosis Same as Pre-procedure Electronic Signature(s) Signed: 04/11/2019 12:59:05 PM By: Montey Hora Signed: 04/11/2019 1:21:29 PM By: Worthy Keeler PA-C Entered By: Montey Hora on 04/11/2019 08:31:18 [name], [name] ([id number]) -------------------------------------------------------------------------------- HPI Details Patient Name: [name] Date of Service: 04/11/2019 8:00 AM Medical Record Number: [id number] Patient Account Number: [id number] Date of Birth/Sex: 10-15-1946 (73 y.o. M) Treating RN: Montey Hora Primary Care Provider: Glendon Axe Other Clinician: Referring Provider: Glendon Axe Treating Provider/Extender: Melburn Hake, HOYT Weeks in Treatment: 17 History of Present Illness HPI Description: 12/12/18 on evaluation today patient presents as a referral from his endocrinologist regarding issues that he has been having with his left great toe. Subsequently he has been seeing Dr. Cleda Mccreedy and Dr. Cleda Mccreedy has been the breeding the wound and in fact this was debrided this morning. The patient had an appointment there prior to coming here. Subsequently he states that even though he's been going for regular debridement after office things really do not seem to be showing signs of improvement unfortunately. He states that he is having some discomfort but nothing too significant. No fevers, chills, nausea, or vomiting noted at this time. The patient does have a history of hypertension, neuropathy, diabetes mellitus type II, and a history of stroke. Currently he's been using bacitracin on the toe and  utilizing a Band-Aid. He tells me he's had this wound for two years. He cannot remember the last time he had an x-ray. 12/19/18 on evaluation today patient appears to be doing well in regard to his plantar toe ulcer. He's been tolerating the dressing changes without complication. Fortunately there does not appear to be signs of infection at this time which is good news. No fever chills noted. 01/19/19 has been one month since I last  saw the patient as he was out of town. With that being said on evaluation today it does appear that he is going to require sharp debridement and I do believe that the total contact cast would benefit him as far as being applied at this time. He is in agreement that plan. 01/23/19 on evaluation today patient appears to be doing better in regard to his left great toe ulcer. He has been shown signs of improvement week by week and although this is slow it does seem to be doing fairly well which is good news. Overall very pleased in this regard. 01/31/19 on evaluation today patient presents for follow-up concerning his great toe ulcer. He was actually supposed to be here yesterday and he did come however he ended up having to leave due to his blood sugar dropping he states he just did not eat enough lunch. Fortunately seems to be doing much better today which is excellent news. Fortunately there's no evidence of infection at this time which is also good news. No fevers, chills, nausea, or vomiting noted at this time. Overall I feel like he is making excellent progress. 02/06/19 on evaluation today patient actually appears to be doing very well in regard to his plantar toe ulcer. He's been tolerating the dressing changes without complication. Fortunately there is no sign of infection at this time. Overall been very pleased with how things seem to be progressing. No fevers, chills, nausea, or vomiting noted at this time. 02/13/19 on evaluation today patient actually appears to be  doing excellent in regard to his toe ulcer which is almost completely close. Overall very pleased with that. There does not appear to be any signs of infection which is good news. Upon close inspection it appears that he has just a very slight opening still noted at the central portion of the toe although this is minimal. 02/16/19 on evaluation today patient is seen for early follow-up due to the fact that he tells me while at home he actually got up to go answer the door without putting on the walking boot part of his cast and subsequently damaged the total contact cast. It therefore comes in to have this removed and potentially replaced. Fortunately other than it given him a little bit of pain its far as pushing in on some areas there doesn't appear to be any injury once the cast was removed. 03/13/19 on evaluation today patient unfortunately presents for follow-up concerning his great toe ulcer on the left. He was discharged on 02/16/19 with the wound healed at that point. He tells me this remain so for several weeks or least a couple weeks before reopening. Fortunately there does not appear to be any signs of infection at this time. Nonetheless for the past week at least he's had the wound reopened and states it is been trying to keep pressure off of this but he has not been using the offloading shoe we previously gave him. Fortunately there's no evidence of infection at this time. No fevers, chills, nausea, or vomiting noted at this time. [name], [name] ([id number]) 03/17/19 on evaluation today patient actually appears to be doing rather well in regard to his toe also. Fact this appears to be significantly smaller this week even compared to what I saw last week on evaluation. He seems to have done very well for the offloading shoe and overall I'm very pleased with the progress that is made. It may be that he doesn't even need the cast to  get this to heal if he offloads this  appropriately. 03/24/19 on evaluation today patient actually appears to be doing well in regard to his plantar foot ulcer. He's been tolerating the dressing changes without complication the collagen does seem to be beneficial for him. With that being said he is gonna require some miles sharp debridement today to clear away some of the callous's around the edge of the wound as well as the minimal Slough noted on the surface of the wound. 03/31/19 on evaluation today patient appears to be doing well in regard to his plantar great toe ulcer. He has been tolerating the dressing changes without complication which is good news. Fortunately there does not appear to be any signs of active infection at this time. Overall very pleased with how things seem to be progressing. 04/11/19 on evaluation today patient's tools are appears to be doing in my pinion roughly about the same as were things that previous. Fortunately there does not appear to be signs of active infection at this time which is good news. With that being said he has not been experiencing any pain at this time which is good news there is also No fevers, chills, nausea, or vomiting noted at this time. Electronic Signature(s) Signed: 04/11/2019 1:21:29 PM By: Worthy Keeler PA-C Entered By: Worthy Keeler on 04/11/2019 08:52:08 [name] ([id number]) -------------------------------------------------------------------------------- Physical Exam Details Patient Name: [name] Date of Service: 04/11/2019 8:00 AM Medical Record Number: [id number] Patient Account Number: [id number] Date of Birth/Sex: [birth year] (73 y.o. M) Treating RN: Montey Hora Primary Care Provider: Glendon Axe Other Clinician: Referring Provider: Glendon Axe Treating Provider/Extender: STONE III, HOYT Weeks in Treatment: 74 Constitutional Well-nourished and well-hydrated in no acute distress. Respiratory normal breathing without  difficulty. Psychiatric this patient is able to make decisions and demonstrates good insight into disease process. Alert and Oriented x 3. pleasant and cooperative. Notes Patient's wound bed at this point did have some callous overlying the surface of the wound which require sharp debridement at this point. I was able to perform this debridement without any complication he had no pain post debridement wound bed appear to be doing much better and measurements were taken post debridement in order to monitor how the wound appears today versus how it appears postapproval next week. If he's not doing significantly better on the will to change his dressing to something different at that point. Electronic Signature(s) Signed: 04/11/2019 1:21:29 PM By: Worthy Keeler PA-C Entered By: Worthy Keeler on 04/11/2019 08:52:43 [name], [name] ([id number]) -------------------------------------------------------------------------------- Physician Orders Details Patient Name: [name] Date of Service: 04/11/2019 8:00 AM Medical Record Number: [id number] Patient Account Number: [id number] Date of Birth/Sex: September 12, 1946 (73 y.o. M) Treating RN: Montey Hora Primary Care Provider: Glendon Axe Other Clinician: Referring Provider: Glendon Axe Treating Provider/Extender: Melburn Hake, HOYT Weeks in Treatment: 17 Verbal / Phone Orders: No Diagnosis Coding ICD-10 Coding Code Description E11.621 Type 2 diabetes mellitus with foot ulcer L97.522 Non-pressure chronic ulcer of other part of left foot with fat layer exposed I10 Essential (primary) hypertension E11.43 Type 2 diabetes mellitus with diabetic autonomic (poly)neuropathy Wound Cleansing Wound #1R Left Toe Great o Clean wound with Normal Saline. o May Shower, gently pat wound dry prior to applying new dressing. Primary Wound Dressing Wound #1R Left Toe Great o Silver Collagen Secondary Dressing Wound #1R Left Toe  Great o Conform/Kerlix o Foam Dressing Change Frequency Wound #1R Left Toe Great o Change dressing every other day.  Follow-up Appointments Wound #1R Left Toe Great o Return Appointment in 1 week. Off-Loading Wound #1R Left Toe Great o Open toe surgical shoe to: - left foot, felt surrounding wound Electronic Signature(s) Signed: 04/11/2019 12:59:05 PM By: Montey Hora Signed: 04/11/2019 1:21:29 PM By: Worthy Keeler PA-C Entered By: Montey Hora on 04/11/2019 08:30:05 [name], [name] ([id number]) -------------------------------------------------------------------------------- Problem List Details Patient Name: [name], [name]. Date of Service: 04/11/2019 8:00 AM Medical Record Number: [id number] Patient Account Number: [id number] Date of Birth/Sex: 1946-09-14 (73 y.o. M) Treating RN: Montey Hora Primary Care Provider: Glendon Axe Other Clinician: Referring Provider: Glendon Axe Treating Provider/Extender: Melburn Hake, HOYT Weeks in Treatment: 17 Active Problems ICD-10 Evaluated Encounter Code Description Active Date Today Diagnosis E11.621 Type 2 diabetes mellitus with foot ulcer 12/12/2018 No Yes L97.522 Non-pressure chronic ulcer of other part of left foot with fat 12/12/2018 No Yes layer exposed I10 Essential (primary) hypertension 12/12/2018 No Yes E11.43 Type 2 diabetes mellitus with diabetic autonomic (poly) 12/12/2018 No Yes neuropathy Inactive Problems Resolved Problems Electronic Signature(s) Signed: 04/11/2019 1:21:29 PM By: Worthy Keeler PA-C Entered By: Worthy Keeler on 04/11/2019 08:14:57 [name], [name] ([id number]) -------------------------------------------------------------------------------- Progress Note Details Patient Name: [name] Date of Service: 04/11/2019 8:00 AM Medical Record Number: [id number] Patient Account Number: [id number] Date of Birth/Sex: 1946/09/11 (73 y.o. M) Treating RN:  Montey Hora Primary Care Provider: Glendon Axe Other Clinician: Referring Provider: Glendon Axe Treating Provider/Extender: Melburn Hake, HOYT Weeks in Treatment: 17 Subjective Chief Complaint Information obtained from Patient Left great toe ulcer History of Present Illness (HPI) 12/12/18 on evaluation today patient presents as a referral from his endocrinologist regarding issues that he has been having with his left great toe. Subsequently he has been seeing Dr. Cleda Mccreedy and Dr. Cleda Mccreedy has been the breeding the wound and in fact this was debrided this morning. The patient had an appointment there prior to coming here. Subsequently he states that even though he's been going for regular debridement after office things really do not seem to be showing signs of improvement unfortunately. He states that he is having some discomfort but nothing too significant. No fevers, chills, nausea, or vomiting noted at this time. The patient does have a history of hypertension, neuropathy, diabetes mellitus type II, and a history of stroke. Currently he's been using bacitracin on the toe and utilizing a Band-Aid. He tells me he's had this wound for two years. He cannot remember the last time he had an x-ray. 12/19/18 on evaluation today patient appears to be doing well in regard to his plantar toe ulcer. He's been tolerating the dressing changes without complication. Fortunately there does not appear to be signs of infection at this time which is good news. No fever chills noted. 01/19/19 has been one month since I last saw the patient as he was out of town. With that being said on evaluation today it does appear that he is going to require sharp debridement and I do believe that the total contact cast would benefit him as far as being applied at this time. He is in agreement that plan. 01/23/19 on evaluation today patient appears to be doing better in regard to his left great toe ulcer. He has been shown  signs of improvement week by week and although this is slow it does seem to be doing fairly well which is good news. Overall very pleased in this regard. 01/31/19 on evaluation today patient presents for follow-up concerning his great toe ulcer. He  was actually supposed to be here yesterday and he did come however he ended up having to leave due to his blood sugar dropping he states he just did not eat enough lunch. Fortunately seems to be doing much better today which is excellent news. Fortunately there's no evidence of infection at this time which is also good news. No fevers, chills, nausea, or vomiting noted at this time. Overall I feel like he is making excellent progress. 02/06/19 on evaluation today patient actually appears to be doing very well in regard to his plantar toe ulcer. He's been tolerating the dressing changes without complication. Fortunately there is no sign of infection at this time. Overall been very pleased with how things seem to be progressing. No fevers, chills, nausea, or vomiting noted at this time. 02/13/19 on evaluation today patient actually appears to be doing excellent in regard to his toe ulcer which is almost completely close. Overall very pleased with that. There does not appear to be any signs of infection which is good news. Upon close inspection it appears that he has just a very slight opening still noted at the central portion of the toe although this is minimal. 02/16/19 on evaluation today patient is seen for early follow-up due to the fact that he tells me while at home he actually got up to go answer the door without putting on the walking boot part of his cast and subsequently damaged the total contact cast. It therefore comes in to have this removed and potentially replaced. Fortunately other than it given him a little bit of pain its far as pushing in on some areas there doesn't appear to be any injury once the cast was removed. [name], [name]  ([id number]) 03/13/19 on evaluation today patient unfortunately presents for follow-up concerning his great toe ulcer on the left. He was discharged on 02/16/19 with the wound healed at that point. He tells me this remain so for several weeks or least a couple weeks before reopening. Fortunately there does not appear to be any signs of infection at this time. Nonetheless for the past week at least he's had the wound reopened and states it is been trying to keep pressure off of this but he has not been using the offloading shoe we previously gave him. Fortunately there's no evidence of infection at this time. No fevers, chills, nausea, or vomiting noted at this time. 03/17/19 on evaluation today patient actually appears to be doing rather well in regard to his toe also. Fact this appears to be significantly smaller this week even compared to what I saw last week on evaluation. He seems to have done very well for the offloading shoe and overall I'm very pleased with the progress that is made. It may be that he doesn't even need the cast to get this to heal if he offloads this appropriately. 03/24/19 on evaluation today patient actually appears to be doing well in regard to his plantar foot ulcer. He's been tolerating the dressing changes without complication the collagen does seem to be beneficial for him. With that being said he is gonna require some miles sharp debridement today to clear away some of the callous's around the edge of the wound as well as the minimal Slough noted on the surface of the wound. 03/31/19 on evaluation today patient appears to be doing well in regard to his plantar great toe ulcer. He has been tolerating the dressing changes without complication which is good news. Fortunately there does not  appear to be any signs of active infection at this time. Overall very pleased with how things seem to be progressing. 04/11/19 on evaluation today patient's tools are appears to be doing in  my pinion roughly about the same as were things that previous. Fortunately there does not appear to be signs of active infection at this time which is good news. With that being said he has not been experiencing any pain at this time which is good news there is also No fevers, chills, nausea, or vomiting noted at this time. Patient History Information obtained from Patient. Family History Diabetes - Mother, Heart Disease - Father, Stroke - Father, No family history of Cancer, Hereditary Spherocytosis, Hypertension, Kidney Disease, Lung Disease, Seizures, Thyroid Problems, Tuberculosis. Social History Never smoker, Marital Status - Widowed, Alcohol Use - Never, Drug Use - No History, Caffeine Use - Moderate. Medical History Eyes Denies history of Cataracts, Glaucoma, Optic Neuritis Ear/Nose/Mouth/Throat Denies history of Chronic sinus problems/congestion, Middle ear problems Hematologic/Lymphatic Denies history of Anemia, Hemophilia, Human Immunodeficiency Virus, Lymphedema Respiratory Denies history of Aspiration, Asthma, Chronic Obstructive Pulmonary Disease (COPD), Pneumothorax, Sleep Apnea, Tuberculosis Cardiovascular Patient has history of Hypertension Denies history of Angina, Arrhythmia, Congestive Heart Failure, Coronary Artery Disease, Deep Vein Thrombosis, Hypotension, Myocardial Infarction, Peripheral Arterial Disease, Peripheral Venous Disease, Phlebitis, Vasculitis Gastrointestinal Denies history of Cirrhosis , Colitis, Crohn s, Hepatitis A, Hepatitis B, Hepatitis C Endocrine Patient has history of Type II Diabetes Denies history of Type I Diabetes Genitourinary Denies history of End Stage Renal Disease Immunological FAHEEM, ZIEMANN ([id number]) Denies history of Lupus Erythematosus, Raynaud s, Scleroderma Integumentary (Skin) Denies history of History of Burn, History of pressure wounds Musculoskeletal Denies history of Gout, Rheumatoid Arthritis,  Osteoarthritis, Osteomyelitis Neurologic Denies history of Dementia, Neuropathy, Quadriplegia, Paraplegia, Seizure Disorder Oncologic Denies history of Received Chemotherapy, Received Radiation Review of Systems (ROS) Constitutional Symptoms (General Health) Denies complaints or symptoms of Fatigue, Fever, Chills, Marked Weight Change. Respiratory Denies complaints or symptoms of Chronic or frequent coughs, Shortness of Breath. Cardiovascular Denies complaints or symptoms of Chest pain, LE edema. Psychiatric Denies complaints or symptoms of Anxiety, Claustrophobia. Objective Constitutional Well-nourished and well-hydrated in no acute distress. Vitals Time Taken: 8:06 AM, Height: 72 in, Weight: 250 lbs, BMI: 33.9, Temperature: 97.9 F, Pulse: 76 bpm, Respiratory Rate: 16 breaths/min, Blood Pressure: 153/69 mmHg. Respiratory normal breathing without difficulty. Psychiatric this patient is able to make decisions and demonstrates good insight into disease process. Alert and Oriented x 3. pleasant and cooperative. General Notes: Patient's wound bed at this point did have some callous overlying the surface of the wound which require sharp debridement at this point. I was able to perform this debridement without any complication he had no pain post debridement wound bed appear to be doing much better and measurements were taken post debridement in order to monitor how the wound appears today versus how it appears postapproval next week. If he's not doing significantly better on the will to change his dressing to something different at that point. Integumentary (Hair, Skin) Wound #1R status is Open. Original cause of wound was Gradually Appeared. The wound is located on the Left Toe Great. The wound measures 0.3cm length x 0.4cm width x 0.3cm depth; 0.094cm^2 area and 0.028cm^3 volume. There is Fat Layer (Subcutaneous Tissue) Exposed exposed. There is no tunneling or undermining noted. There  is a medium amount of serosanguineous drainage noted. The wound margin is thickened. There is large (67-100%) pale granulation within the wound bed. There  is a small (1-33%) amount of necrotic tissue within the wound bed including Adherent Slough. The periwound skin appearance exhibited: Callus. The periwound skin appearance did not exhibit: Crepitus, Excoriation, Induration, Rash, Scarring, Dry/Scaly, Maceration, Atrophie Blanche, Cyanosis, Ecchymosis, Hemosiderin Staining, Mottled, Wale, Khalfani L. ([id number]) Pallor, Rubor, Erythema. Periwound temperature was noted as No Abnormality. The periwound has tenderness on palpation. Assessment Active Problems ICD-10 Type 2 diabetes mellitus with foot ulcer Non-pressure chronic ulcer of other part of left foot with fat layer exposed Essential (primary) hypertension Type 2 diabetes mellitus with diabetic autonomic (poly)neuropathy Procedures Wound #1R Pre-procedure diagnosis of Wound #1R is a Diabetic Wound/Ulcer of the Lower Extremity located on the Left Toe Great .Severity of Tissue Pre Debridement is: Fat layer exposed. There was a Excisional Skin/Subcutaneous Tissue Debridement with a total area of 0.12 sq cm performed by STONE III, HOYT E., PA-C. With the following instrument(s): Curette to remove Viable and Non-Viable tissue/material. Material removed includes Callus, Subcutaneous Tissue, and Slough after achieving pain control using Lidocaine 4% Topical Solution. No specimens were taken. A time out was conducted at 08:25, prior to the start of the procedure. A Minimum amount of bleeding was controlled with Pressure. The procedure was tolerated well with a pain level of 0 throughout and a pain level of 0 following the procedure. Post Debridement Measurements: 0.5cm length x 0.7cm width x 0.1cm depth; 0.027cm^3 volume. Character of Wound/Ulcer Post Debridement is improved. Severity of Tissue Post Debridement is: Fat layer exposed. Post  procedure Diagnosis Wound #1R: Same as Pre-Procedure Plan Wound Cleansing: Wound #1R Left Toe Great: Clean wound with Normal Saline. May Shower, gently pat wound dry prior to applying new dressing. Primary Wound Dressing: Wound #1R Left Toe Great: Silver Collagen Secondary Dressing: Wound #1R Left Toe Great: Conform/Kerlix Foam Dressing Change Frequency: Wound #1R Left Toe Great: Change dressing every other day. Follow-up Appointments: Wound #1R Left Toe Great: Return Appointment in 1 week. JAIYON, WANDER ([id number]) Off-Loading: Wound #1R Left Toe Great: Open toe surgical shoe to: - left foot, felt surrounding wound At this time patient's wound is doing well and I feel like that though it is not any better it's also not appear to be any worse at this point which is good news. My suggestion is that we go ahead and continue with the Current wound care measures for one more week. The post debridement measurements next week are not smaller than this week then I would consider either switching the dressing or potentially a total contact cast that something is done well with in the past we been holding off on this due to the current Covid-19 Virus pandemic and the risk of him potentially getting infected and not being able to come in for cast removal and reapplication. Nonetheless the levels of virus infection are very low in our area and if this continues to be the case of the next week then we may consider the next one-two weeks putting this patient back into a cast. He is in agreement with that plan. Otherwise if anything changes the meantime will contact the office and let me know. Please see above for specific wound care orders. We will see patient for re-evaluation in 1 week(s) here in the clinic. If anything worsens or changes patient will contact our office for additional recommendations. Electronic Signature(s) Signed: 04/11/2019 1:21:29 PM By: Worthy Keeler  PA-C Entered By: Worthy Keeler on 04/11/2019 08:53:54 [name], [name] ([id number]) -------------------------------------------------------------------------------- ROS/PFSH Details Patient Name: [name]  Date of Service: 04/11/2019 8:00 AM Medical Record Number: [id number] Patient Account Number: [id number] Date of Birth/Sex: February 13, 1946 (73 y.o. M) Treating RN: Montey Hora Primary Care Provider: Glendon Axe Other Clinician: Referring Provider: Glendon Axe Treating Provider/Extender: STONE III, HOYT Weeks in Treatment: 17 Information Obtained From Patient Constitutional Symptoms (General Health) Complaints and Symptoms: Negative for: Fatigue; Fever; Chills; Marked Weight Change Respiratory Complaints and Symptoms: Negative for: Chronic or frequent coughs; Shortness of Breath Medical History: Negative for: Aspiration; Asthma; Chronic Obstructive Pulmonary Disease (COPD); Pneumothorax; Sleep Apnea; Tuberculosis Cardiovascular Complaints and Symptoms: Negative for: Chest pain; LE edema Medical History: Positive for: Hypertension Negative for: Angina; Arrhythmia; Congestive Heart Failure; Coronary Artery Disease; Deep Vein Thrombosis; Hypotension; Myocardial Infarction; Peripheral Arterial Disease; Peripheral Venous Disease; Phlebitis; Vasculitis Psychiatric Complaints and Symptoms: Negative for: Anxiety; Claustrophobia Eyes Medical History: Negative for: Cataracts; Glaucoma; Optic Neuritis Ear/Nose/Mouth/Throat Medical History: Negative for: Chronic sinus problems/congestion; Middle ear problems Hematologic/Lymphatic Medical History: Negative for: Anemia; Hemophilia; Human Immunodeficiency Virus; Lymphedema Gastrointestinal MILBURN, FREENEY ([id number]) Medical History: Negative for: Cirrhosis ; Colitis; Crohnos; Hepatitis A; Hepatitis B; Hepatitis C Endocrine Medical History: Positive for: Type II Diabetes Negative for: Type I  Diabetes Time with diabetes: 1990 Treated with: Insulin Blood sugar tested every day: Yes Tested : Genitourinary Medical History: Negative for: End Stage Renal Disease Immunological Medical History: Negative for: Lupus Erythematosus; Raynaudos; Scleroderma Integumentary (Skin) Medical History: Negative for: History of Burn; History of pressure wounds Musculoskeletal Medical History: Negative for: Gout; Rheumatoid Arthritis; Osteoarthritis; Osteomyelitis Neurologic Medical History: Negative for: Dementia; Neuropathy; Quadriplegia; Paraplegia; Seizure Disorder Oncologic Medical History: Negative for: Received Chemotherapy; Received Radiation Immunizations Pneumococcal Vaccine: Received Pneumococcal Vaccination: Yes Implantable Devices No devices added Family and Social History Cancer: No; Diabetes: Yes - Mother; Heart Disease: Yes - Father; Hereditary Spherocytosis: No; Hypertension: No; Kidney Disease: No; Lung Disease: No; Seizures: No; Stroke: Yes - Father; Thyroid Problems: No; Tuberculosis: No; Never smoker; Marital Status - Widowed; Alcohol Use: Never; Drug Use: No History; Caffeine Use: Moderate; Financial Concerns: No; Food, Clothing or Shelter Needs: No; Support System Lacking: No; Transportation Concerns: No Physician Affirmation I have reviewed and agree with the above information. ALYN, JURNEY ([id number]) Electronic Signature(s) Signed: 04/11/2019 12:59:05 PM By: Montey Hora Signed: 04/11/2019 1:21:29 PM By: Worthy Keeler PA-C Entered By: Worthy Keeler on 04/11/2019 08:52:23 Fernandez, [name] ([id number]) -------------------------------------------------------------------------------- SuperBill Details Patient Name: JEFREY, RABURN. Date of Service: 04/11/2019 Medical Record Number: [id number] Patient Account Number: [id number] Date of Birth/Sex: 1946-04-14 (73 y.o. 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

## 2019-04-18 ENCOUNTER — Other Ambulatory Visit: Payer: Self-pay

## 2019-04-18 ENCOUNTER — Encounter: Payer: Medicare Other | Admitting: Physician Assistant

## 2019-04-18 DIAGNOSIS — E11621 Type 2 diabetes mellitus with foot ulcer: Secondary | ICD-10-CM | POA: Diagnosis not present

## 2019-04-21 NOTE — Progress Notes (Signed)
ALDOUS, HOUSEL ([id number]) Visit Report for 04/18/2019 Chief Complaint Document Details Patient Name: [name], [name]. Date of Service: 04/18/2019 8:00 AM Medical Record Number: [id number] Patient Account Number: [id number] Date of Birth/Sex: April 18, 1946 (73 y.o. M) Treating RN: Montey Hora Primary Care Provider: Glendon Axe Other Clinician: Referring Provider: Glendon Axe Treating Provider/Extender: STONE III, HOYT Weeks in Treatment: 18 Information Obtained from: Patient Chief Complaint Left great toe ulcer Electronic Signature(s) Signed: 04/21/2019 8:49:36 AM By: Worthy Keeler PA-C Entered By: Worthy Keeler on 04/18/2019 08:13:01 [name], [name] ([id number]) -------------------------------------------------------------------------------- HPI Details Patient Name: [name] Date of Service: 04/18/2019 8:00 AM Medical Record Number: [id number] Patient Account Number: [id number] Date of Birth/Sex: 10-Mar-1946 (73 y.o. M) Treating RN: Montey Hora Primary Care Provider: Glendon Axe Other Clinician: Referring Provider: Glendon Axe Treating Provider/Extender: Melburn Hake, HOYT Weeks in Treatment: 18 History of Present Illness HPI Description: 12/12/18 on evaluation today patient presents as a referral from his endocrinologist regarding issues that he has been having with his left great toe. Subsequently he has been seeing Dr. Cleda Mccreedy and Dr. Cleda Mccreedy has been the breeding the wound and in fact this was debrided this morning. The patient had an appointment there prior to coming here. Subsequently he states that even though he's been going for regular debridement after office things really do not seem to be showing signs of improvement unfortunately. He states that he is having some discomfort but nothing too significant. No fevers, chills, nausea, or vomiting noted at this time. The patient does have a history of hypertension, neuropathy,  diabetes mellitus type II, and a history of stroke. Currently he's been using bacitracin on the toe and utilizing a Band-Aid. He tells me he's had this wound for two years. He cannot remember the last time he had an x-ray. 12/19/18 on evaluation today patient appears to be doing well in regard to his plantar toe ulcer. He's been tolerating the dressing changes without complication. Fortunately there does not appear to be signs of infection at this time which is good news. No fever chills noted. 01/19/19 has been one month since I last saw the patient as he was out of town. With that being said on evaluation today it does appear that he is going to require sharp debridement and I do believe that the total contact cast would benefit him as far as being applied at this time. He is in agreement that plan. 01/23/19 on evaluation today patient appears to be doing better in regard to his left great toe ulcer. He has been shown signs of improvement week by week and although this is slow it does seem to be doing fairly well which is good news. Overall very pleased in this regard. 01/31/19 on evaluation today patient presents for follow-up concerning his great toe ulcer. He was actually supposed to be here yesterday and he did come however he ended up having to leave due to his blood sugar dropping he states he just did not eat enough lunch. Fortunately seems to be doing much better today which is excellent news. Fortunately there's no evidence of infection at this time which is also good news. No fevers, chills, nausea, or vomiting noted at this time. Overall I feel like he is making excellent progress. 02/06/19 on evaluation today patient actually appears to be doing very well in regard to his plantar toe ulcer. He's been tolerating the dressing changes without complication. Fortunately there is no sign of infection at this time. Overall been  very pleased with how things seem to be progressing. No fevers,  chills, nausea, or vomiting noted at this time. 02/13/19 on evaluation today patient actually appears to be doing excellent in regard to his toe ulcer which is almost completely close. Overall very pleased with that. There does not appear to be any signs of infection which is good news. Upon close inspection it appears that he has just a very slight opening still noted at the central portion of the toe although this is minimal. 02/16/19 on evaluation today patient is seen for early follow-up due to the fact that he tells me while at home he actually got up to go answer the door without putting on the walking boot part of his cast and subsequently damaged the total contact cast. It therefore comes in to have this removed and potentially replaced. Fortunately other than it given him a little bit of pain its far as pushing in on some areas there doesn't appear to be any injury once the cast was removed. 03/13/19 on evaluation today patient unfortunately presents for follow-up concerning his great toe ulcer on the left. He was discharged on 02/16/19 with the wound healed at that point. He tells me this remain so for several weeks or least a couple weeks before reopening. Fortunately there does not appear to be any signs of infection at this time. Nonetheless for the past week at least he's had the wound reopened and states it is been trying to keep pressure off of this but he has not been using the offloading shoe we previously gave him. Fortunately there's no evidence of infection at this time. No fevers, chills, nausea, or vomiting noted at this time. [name], [name] ([id number]) 03/17/19 on evaluation today patient actually appears to be doing rather well in regard to his toe also. Fact this appears to be significantly smaller this week even compared to what I saw last week on evaluation. He seems to have done very well for the offloading shoe and overall I'm very pleased with the progress that is  made. It may be that he doesn't even need the cast to get this to heal if he offloads this appropriately. 03/24/19 on evaluation today patient actually appears to be doing well in regard to his plantar foot ulcer. He's been tolerating the dressing changes without complication the collagen does seem to be beneficial for him. With that being said he is gonna require some miles sharp debridement today to clear away some of the callous's around the edge of the wound as well as the minimal Slough noted on the surface of the wound. 03/31/19 on evaluation today patient appears to be doing well in regard to his plantar great toe ulcer. He has been tolerating the dressing changes without complication which is good news. Fortunately there does not appear to be any signs of active infection at this time. Overall very pleased with how things seem to be progressing. 04/11/19 on evaluation today patient's tools are appears to be doing in my pinion roughly about the same as were things that previous. Fortunately there does not appear to be signs of active infection at this time which is good news. With that being said he has not been experiencing any pain at this time which is good news there is also No fevers, chills, nausea, or vomiting noted at this time. 04/18/19 on evaluation today patient's wound actually appears to be doing fairly well today he did not even really have a lot of  callous buildup. We have been debating on whether or not to reinitiate total contact cast. With that being said he seems to be doing fairly well this point with offloading in my pinion and again I think we need to come up with a way to get this healed that will also be sustainable for keeping it close. Again we were able to close it with the cast previous but again he has to be able to keep it such. For that reason we are working on trying to get him diabetic shoes he's waiting for the [place] to get in touch with the local company that has  to measure him for these do not want to have a cast on at such a time as when they need to actually measure him. Electronic Signature(s) Signed: 04/21/2019 8:49:36 AM By: Worthy Keeler PA-C Entered By: Worthy Keeler on 04/18/2019 08:26:35 [place], [name] ([id number]) -------------------------------------------------------------------------------- Physical Exam Details Patient Name: [name] Date of Service: 04/18/2019 8:00 AM Medical Record Number: [id number] Patient Account Number: [id number] Date of Birth/Sex: 1946/09/23 (73 y.o. M) Treating RN: Montey Hora Primary Care Provider: Glendon Axe Other Clinician: Referring Provider: Glendon Axe Treating Provider/Extender: STONE III, HOYT Weeks in Treatment: 95 Constitutional Well-nourished and well-hydrated in no acute distress. Respiratory normal breathing without difficulty. clear to auscultation bilaterally. Cardiovascular regular rate and rhythm with normal S1, S2. Psychiatric this patient is able to make decisions and demonstrates good insight into disease process. Alert and Oriented x 3. pleasant and cooperative. Notes Patient's wound bed on inspection today doesn't appear to show any signs of significant callous buildup which is good news the surface of the wound also looks fairly well at this point. We have been using silver collagen which seems to be doing very well for him. Electronic Signature(s) Signed: 04/21/2019 8:49:36 AM By: Worthy Keeler PA-C Entered By: Worthy Keeler on 04/18/2019 08:27:29 [name] ([id number]) -------------------------------------------------------------------------------- Physician Orders Details Patient Name: [name] Date of Service: 04/18/2019 8:00 AM Medical Record Number: [id number] Patient Account Number: [id number] Date of Birth/Sex: [birth year] (73 y.o. M) Treating RN: Cornell Barman Primary Care Provider: Glendon Axe Other  Clinician: Referring Provider: Glendon Axe Treating Provider/Extender: Melburn Hake, HOYT Weeks in Treatment: 23 Verbal / Phone Orders: No Diagnosis Coding ICD-10 Coding Code Description E11.621 Type 2 diabetes mellitus with foot ulcer L97.522 Non-pressure chronic ulcer of other part of left foot with fat layer exposed I10 Essential (primary) hypertension E11.43 Type 2 diabetes mellitus with diabetic autonomic (poly)neuropathy Wound Cleansing Wound #1R Left Toe Great o Clean wound with Normal Saline. o May Shower, gently pat wound dry prior to applying new dressing. Primary Wound Dressing Wound #1R Left Toe Great o Silver Collagen Secondary Dressing Wound #1R Left Toe Great o Conform/Kerlix o Foam Dressing Change Frequency Wound #1R Left Toe Great o Change dressing every other day. Follow-up Appointments Wound #1R Left Toe Great o Return Appointment in 1 week. Off-Loading Wound #1R Left Toe Great o Open toe surgical shoe to: - left foot, felt surrounding wound Electronic Signature(s) Signed: 04/18/2019 3:23:54 PM By: Gretta Cool, BSN, RN, CWS, Kim RN, BSN Signed: 04/21/2019 8:49:36 AM By: Worthy Keeler PA-C Entered By: Gretta Cool, BSN, RN, CWS, Kim on 04/18/2019 72:62:03 [name] ([id number]) -------------------------------------------------------------------------------- Problem List Details Patient Name: [name], [name]. Date of Service: 04/18/2019 8:00 AM Medical Record Number: [id number] Patient Account Number: [id number] Date of Birth/Sex: 1946/12/08 (73 y.o. M) Treating RN: Montey Hora Primary Care Provider: Glendon Axe  Other Clinician: Referring Provider: Glendon Axe Treating Provider/Extender: STONE III, HOYT Weeks in Treatment: 18 Active Problems ICD-10 Evaluated Encounter Code Description Active Date Today Diagnosis E11.621 Type 2 diabetes mellitus with foot ulcer 12/12/2018 No Yes L97.522 Non-pressure chronic ulcer of  other part of left foot with fat 12/12/2018 No Yes layer exposed I10 Essential (primary) hypertension 12/12/2018 No Yes E11.43 Type 2 diabetes mellitus with diabetic autonomic (poly) 12/12/2018 No Yes neuropathy Inactive Problems Resolved Problems Electronic Signature(s) Signed: 04/21/2019 8:49:36 AM By: Worthy Keeler PA-C Entered By: Worthy Keeler on 04/18/2019 08:12:50 [name], [name] ([id number]) -------------------------------------------------------------------------------- Progress Note Details Patient Name: [name] Date of Service: 04/18/2019 8:00 AM Medical Record Number: [id number] Patient Account Number: [id number] Date of Birth/Sex: December 06, 1946 (73 y.o. M) Treating RN: Montey Hora Primary Care Provider: Glendon Axe Other Clinician: Referring Provider: Glendon Axe Treating Provider/Extender: Melburn Hake, HOYT Weeks in Treatment: 18 Subjective Chief Complaint Information obtained from Patient Left great toe ulcer History of Present Illness (HPI) 12/12/18 on evaluation today patient presents as a referral from his endocrinologist regarding issues that he has been having with his left great toe. Subsequently he has been seeing Dr. Cleda Mccreedy and Dr. Cleda Mccreedy has been the breeding the wound and in fact this was debrided this morning. The patient had an appointment there prior to coming here. Subsequently he states that even though he's been going for regular debridement after office things really do not seem to be showing signs of improvement unfortunately. He states that he is having some discomfort but nothing too significant. No fevers, chills, nausea, or vomiting noted at this time. The patient does have a history of hypertension, neuropathy, diabetes mellitus type II, and a history of stroke. Currently he's been using bacitracin on the toe and utilizing a Band-Aid. He tells me he's had this wound for two years. He cannot remember the last time he had  an x-ray. 12/19/18 on evaluation today patient appears to be doing well in regard to his plantar toe ulcer. He's been tolerating the dressing changes without complication. Fortunately there does not appear to be signs of infection at this time which is good news. No fever chills noted. 01/19/19 has been one month since I last saw the patient as he was out of town. With that being said on evaluation today it does appear that he is going to require sharp debridement and I do believe that the total contact cast would benefit him as far as being applied at this time. He is in agreement that plan. 01/23/19 on evaluation today patient appears to be doing better in regard to his left great toe ulcer. He has been shown signs of improvement week by week and although this is slow it does seem to be doing fairly well which is good news. Overall very pleased in this regard. 01/31/19 on evaluation today patient presents for follow-up concerning his great toe ulcer. He was actually supposed to be here yesterday and he did come however he ended up having to leave due to his blood sugar dropping he states he just did not eat enough lunch. Fortunately seems to be doing much better today which is excellent news. Fortunately there's no evidence of infection at this time which is also good news. No fevers, chills, nausea, or vomiting noted at this time. Overall I feel like he is making excellent progress. 02/06/19 on evaluation today patient actually appears to be doing very well in regard to his plantar toe ulcer. He's been  tolerating the dressing changes without complication. Fortunately there is no sign of infection at this time. Overall been very pleased with how things seem to be progressing. No fevers, chills, nausea, or vomiting noted at this time. 02/13/19 on evaluation today patient actually appears to be doing excellent in regard to his toe ulcer which is almost completely close. Overall very pleased with that.  There does not appear to be any signs of infection which is good news. Upon close inspection it appears that he has just a very slight opening still noted at the central portion of the toe although this is minimal. 02/16/19 on evaluation today patient is seen for early follow-up due to the fact that he tells me while at home he actually got up to go answer the door without putting on the walking boot part of his cast and subsequently damaged the total contact cast. It therefore comes in to have this removed and potentially replaced. Fortunately other than it given him a little bit of pain its far as pushing in on some areas there doesn't appear to be any injury once the cast was removed. [name], [name] ([id number]) 03/13/19 on evaluation today patient unfortunately presents for follow-up concerning his great toe ulcer on the left. He was discharged on 02/16/19 with the wound healed at that point. He tells me this remain so for several weeks or least a couple weeks before reopening. Fortunately there does not appear to be any signs of infection at this time. Nonetheless for the past week at least he's had the wound reopened and states it is been trying to keep pressure off of this but he has not been using the offloading shoe we previously gave him. Fortunately there's no evidence of infection at this time. No fevers, chills, nausea, or vomiting noted at this time. 03/17/19 on evaluation today patient actually appears to be doing rather well in regard to his toe also. Fact this appears to be significantly smaller this week even compared to what I saw last week on evaluation. He seems to have done very well for the offloading shoe and overall I'm very pleased with the progress that is made. It may be that he doesn't even need the cast to get this to heal if he offloads this appropriately. 03/24/19 on evaluation today patient actually appears to be doing well in regard to his plantar foot ulcer.  He's been tolerating the dressing changes without complication the collagen does seem to be beneficial for him. With that being said he is gonna require some miles sharp debridement today to clear away some of the callous's around the edge of the wound as well as the minimal Slough noted on the surface of the wound. 03/31/19 on evaluation today patient appears to be doing well in regard to his plantar great toe ulcer. He has been tolerating the dressing changes without complication which is good news. Fortunately there does not appear to be any signs of active infection at this time. Overall very pleased with how things seem to be progressing. 04/11/19 on evaluation today patient's tools are appears to be doing in my pinion roughly about the same as were things that previous. Fortunately there does not appear to be signs of active infection at this time which is good news. With that being said he has not been experiencing any pain at this time which is good news there is also No fevers, chills, nausea, or vomiting noted at this time. 04/18/19 on evaluation today patient's  wound actually appears to be doing fairly well today he did not even really have a lot of callous buildup. We have been debating on whether or not to reinitiate total contact cast. With that being said he seems to be doing fairly well this point with offloading in my pinion and again I think we need to come up with a way to get this healed that will also be sustainable for keeping it close. Again we were able to close it with the cast previous but again he has to be able to keep it such. For that reason we are working on trying to get him diabetic shoes he's waiting for the [place] to get in touch with the local company that has to measure him for these do not want to have a cast on at such a time as when they need to actually measure him. Patient History Information obtained from Patient. Family History Diabetes - Mother, Heart Disease  - Father, Stroke - Father, No family history of Cancer, Hereditary Spherocytosis, Hypertension, Kidney Disease, Lung Disease, Seizures, Thyroid Problems, Tuberculosis. Social History Never smoker, Marital Status - Widowed, Alcohol Use - Never, Drug Use - No History, Caffeine Use - Moderate. Medical History Eyes Denies history of Cataracts, Glaucoma, Optic Neuritis Ear/Nose/Mouth/Throat Denies history of Chronic sinus problems/congestion, Middle ear problems Hematologic/Lymphatic Denies history of Anemia, Hemophilia, Human Immunodeficiency Virus, Lymphedema Respiratory Denies history of Aspiration, Asthma, Chronic Obstructive Pulmonary Disease (COPD), Pneumothorax, Sleep Apnea, Tuberculosis Cardiovascular Patient has history of Hypertension Denies history of Angina, Arrhythmia, Congestive Heart Failure, Coronary Artery Disease, Deep Vein Thrombosis, Hypotension, Myocardial Infarction, Peripheral Arterial Disease, Peripheral Venous Disease, Phlebitis, Vasculitis [place], [name] ([id number]) Gastrointestinal Denies history of Cirrhosis , Colitis, Crohn s, Hepatitis A, Hepatitis B, Hepatitis C Endocrine Patient has history of Type II Diabetes Denies history of Type I Diabetes Genitourinary Denies history of End Stage Renal Disease Immunological Denies history of Lupus Erythematosus, Raynaud s, Scleroderma Integumentary (Skin) Denies history of History of Burn, History of pressure wounds Musculoskeletal Denies history of Gout, Rheumatoid Arthritis, Osteoarthritis, Osteomyelitis Neurologic Denies history of Dementia, Neuropathy, Quadriplegia, Paraplegia, Seizure Disorder Oncologic Denies history of Received Chemotherapy, Received Radiation Review of Systems (ROS) Constitutional Symptoms (General Health) Denies complaints or symptoms of Fatigue, Fever, Chills, Marked Weight Change. Respiratory Denies complaints or symptoms of Chronic or frequent coughs, Shortness of  Breath. Cardiovascular Denies complaints or symptoms of Chest pain, LE edema. Psychiatric Denies complaints or symptoms of Anxiety, Claustrophobia. Objective Constitutional Well-nourished and well-hydrated in no acute distress. Vitals Time Taken: 8:05 AM, Height: 72 in, Weight: 250 lbs, BMI: 33.9, Temperature: 98.0 F, Pulse: 76 bpm, Respiratory Rate: 16 breaths/min, Blood Pressure: 178/69 mmHg. General Notes: Patient states he has not taken his BP meds today. Respiratory normal breathing without difficulty. clear to auscultation bilaterally. Cardiovascular regular rate and rhythm with normal S1, S2. Psychiatric this patient is able to make decisions and demonstrates good insight into disease process. Alert and Oriented x 3. pleasant and cooperative. General Notes: Patient's wound bed on inspection today doesn't appear to show any signs of significant callous buildup which is good news the surface of the wound also looks fairly well at this point. We have been using silver collagen which seems to [name], [name]. ([id number]) be doing very well for him. Integumentary (Hair, Skin) Wound #1R status is Open. Original cause of wound was Gradually Appeared. The wound is located on the Left Toe Great. The wound measures 0.4cm length x 0.5cm width x 0.3cm depth;  0.157cm^2 area and 0.047cm^3 volume. There is Fat Layer (Subcutaneous Tissue) Exposed exposed. There is no tunneling or undermining noted. There is a medium amount of serosanguineous drainage noted. The wound margin is thickened. There is large (67-100%) pale granulation within the wound bed. There is a small (1-33%) amount of necrotic tissue within the wound bed including Adherent Slough. The periwound skin appearance exhibited: Callus. The periwound skin appearance did not exhibit: Crepitus, Excoriation, Induration, Rash, Scarring, Dry/Scaly, Maceration, Atrophie Blanche, Cyanosis, Ecchymosis, Hemosiderin Staining,  Mottled, Pallor, Rubor, Erythema. Periwound temperature was noted as No Abnormality. The periwound has tenderness on palpation. Assessment Active Problems ICD-10 Type 2 diabetes mellitus with foot ulcer Non-pressure chronic ulcer of other part of left foot with fat layer exposed Essential (primary) hypertension Type 2 diabetes mellitus with diabetic autonomic (poly)neuropathy Plan Wound Cleansing: Wound #1R Left Toe Great: Clean wound with Normal Saline. May Shower, gently pat wound dry prior to applying new dressing. Primary Wound Dressing: Wound #1R Left Toe Great: Silver Collagen Secondary Dressing: Wound #1R Left Toe Great: Conform/Kerlix Foam Dressing Change Frequency: Wound #1R Left Toe Great: Change dressing every other day. Follow-up Appointments: Wound #1R Left Toe Great: Return Appointment in 1 week. Off-Loading: Wound #1R Left Toe Great: Open toe surgical shoe to: - left foot, felt surrounding wound My suggestion is gonna be that we go ahead and continue with the above wound care measures for the next week and the patient is in agreement the plan. We will subsequently see were things stand at follow-up. [name], [name] ([id number]) Please see above for specific wound care orders. We will see patient for re-evaluation in 1 week(s) here in the clinic. If anything worsens or changes patient will contact our office for additional recommendations. Electronic Signature(s) Signed: 04/21/2019 8:49:36 AM By: Worthy Keeler PA-C Entered By: Worthy Keeler on 04/18/2019 08:27:48 [name] ([id number]) -------------------------------------------------------------------------------- ROS/PFSH Details Patient Name: [name] Date of Service: 04/18/2019 8:00 AM Medical Record Number: [id number] Patient Account Number: [id number] Date of Birth/Sex: 23-Sep-1946 (73 y.o. M) Treating RN: Montey Hora Primary Care Provider: Glendon Axe Other  Clinician: Referring Provider: Glendon Axe Treating Provider/Extender: STONE III, HOYT Weeks in Treatment: 18 Information Obtained From Patient Constitutional Symptoms (General Health) Complaints and Symptoms: Negative for: Fatigue; Fever; Chills; Marked Weight Change Respiratory Complaints and Symptoms: Negative for: Chronic or frequent coughs; Shortness of Breath Medical History: Negative for: Aspiration; Asthma; Chronic Obstructive Pulmonary Disease (COPD); Pneumothorax; Sleep Apnea; Tuberculosis Cardiovascular Complaints and Symptoms: Negative for: Chest pain; LE edema Medical History: Positive for: Hypertension Negative for: Angina; Arrhythmia; Congestive Heart Failure; Coronary Artery Disease; Deep Vein Thrombosis; Hypotension; Myocardial Infarction; Peripheral Arterial Disease; Peripheral Venous Disease; Phlebitis; Vasculitis Psychiatric Complaints and Symptoms: Negative for: Anxiety; Claustrophobia Eyes Medical History: Negative for: Cataracts; Glaucoma; Optic Neuritis Ear/Nose/Mouth/Throat Medical History: Negative for: Chronic sinus problems/congestion; Middle ear problems Hematologic/Lymphatic Medical History: Negative for: Anemia; Hemophilia; Human Immunodeficiency Virus; Lymphedema Gastrointestinal [name], [name] ([id number]) Medical History: Negative for: Cirrhosis ; Colitis; Crohnos; Hepatitis A; Hepatitis B; Hepatitis C Endocrine Medical History: Positive for: Type II Diabetes Negative for: Type I Diabetes Time with diabetes: 1990 Treated with: Insulin Blood sugar tested every day: Yes Tested : Genitourinary Medical History: Negative for: End Stage Renal Disease Immunological Medical History: Negative for: Lupus Erythematosus; Raynaudos; Scleroderma Integumentary (Skin) Medical History: Negative for: History of Burn; History of pressure wounds Musculoskeletal Medical History: Negative for: Gout; Rheumatoid Arthritis; Osteoarthritis;  Osteomyelitis Neurologic Medical History: Negative for: Dementia; Neuropathy; Quadriplegia; Paraplegia;  Seizure Disorder Oncologic Medical History: Negative for: Received Chemotherapy; Received Radiation Immunizations Pneumococcal Vaccine: Received Pneumococcal Vaccination: Yes Implantable Devices No devices added Family and Social History Cancer: No; Diabetes: Yes - Mother; Heart Disease: Yes - Father; Hereditary Spherocytosis: No; Hypertension: No; Kidney Disease: No; Lung Disease: No; Seizures: No; Stroke: Yes - Father; Thyroid Problems: No; Tuberculosis: No; Never smoker; Marital Status - Widowed; Alcohol Use: Never; Drug Use: No History; Caffeine Use: Moderate; Financial Concerns: No; Food, Clothing or Shelter Needs: No; Support System Lacking: No; Transportation Concerns: No Physician Affirmation I have reviewed and agree with the above information. [name], [name] ([id number]) Electronic Signature(s) Signed: 04/18/2019 2:55:40 PM By: Montey Hora Signed: 04/21/2019 8:49:36 AM By: Worthy Keeler PA-C Entered By: Worthy Keeler on 04/18/2019 08:26:53 Coyle, [name] ([id number]) -------------------------------------------------------------------------------- SuperBill Details Patient Name: [name] Date of Service: 04/18/2019 Medical Record Number: [id number] Patient Account Number: [id number] Date of Birth/Sex: [birth year] (73 y.o. 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 Facility Procedures CPT4 Code:  40814481 Description: [phone] - WOUND CARE VISIT-LEV 2 EST PT Modifier: Quantity: 1 Physician Procedures CPT4 Code: [id number] Description: 78588 - WC PHYS LEVEL 4 - EST PT ICD-10 Diagnosis Description E11.621 Type 2 diabetes mellitus with foot ulcer L97.522 Non-pressure chronic ulcer of other part of left foot with fat I10 Essential (primary) hypertension E11.43 Type 2 diabetes  mellitus with diabetic autonomic (poly)neuropa Modifier: layer exposed thy Quantity: 1 Electronic Signature(s) Signed: 04/21/2019 8:49:36 AM By: Worthy Keeler PA-C Entered By: Worthy Keeler on 04/18/2019 08:27:59

## 2019-04-21 NOTE — Progress Notes (Signed)
[name], [name] ([id number]) Visit Report for 04/18/2019 Arrival Information Details Patient Name: [name], [name]. Date of Service: 04/18/2019 8:00 AM Medical Record Number: [id number] Patient Account Number: [id number] Date of Birth/Sex: 1946/06/27 (73 y.o. M) Treating [name]: [name] Primary Care [name]: [name] Other Clinician: Referring Kimyatta Lecy: [name] Treating Yaretzi Ernandez/Extender: Melburn Hake, [name] Weeks in Treatment: 18 Visit Information History Since Last Visit Added or deleted any medications: No Patient Arrived: Ambulatory Any new allergies or adverse reactions: No Arrival Time: 08:06 Had a fall or experienced change in No Accompanied By: self activities of daily living that may affect Transfer Assistance: None risk of falls: Patient Identification Verified: Yes Signs or symptoms of abuse/neglect since last visito No Secondary Verification Process Completed: Yes Hospitalized since last visit: No Patient Has Alerts: Yes Implantable device outside of the clinic excluding No Patient Alerts: DMII cellular tissue based products placed in the center since last visit: Has Dressing in Place as Prescribed: Yes Pain Present Now: No Electronic Signature(s) Signed: 04/18/2019 3:23:54 PM By: Gretta Cool, BSN, [name], [name], [name], BSN Entered By: Gretta Cool, BSN, [name], [name], Fred on 04/18/2019 08:06:39 [place], [name] ([id number]) -------------------------------------------------------------------------------- Clinic Level of Care Assessment Details Patient Name: [name]. Date of Service: 04/18/2019 8:00 AM Medical Record Number: [id number] Patient Account Number: [id number] Date of Birth/Sex: Nov 20, 1946 (73 y.o. M) Treating [name]: [name] Primary Care Jaselynn Tamas: [name] Other Clinician: Referring Montserrath Madding: [name] Treating Lainee Lehrman/Extender: Melburn Hake, [name] Weeks in Treatment: 18 Clinic Level of Care Assessment Items TOOL 4 Quantity  Score []  - Use when only an EandM is performed on FOLLOW-UP visit 0 ASSESSMENTS - Nursing Assessment / Reassessment X - Reassessment of Co-morbidities (includes updates in patient status) 1 10 []  - 0 Reassessment of Adherence to Treatment Plan ASSESSMENTS - Wound and Skin Assessment / Reassessment X - Simple Wound Assessment / Reassessment - one wound 1 5 []  - 0 Complex Wound Assessment / Reassessment - multiple wounds []  - 0 Dermatologic / Skin Assessment (not related to wound area) ASSESSMENTS - Focused Assessment []  - Circumferential Edema Measurements - multi extremities 0 []  - 0 Nutritional Assessment / Counseling / Intervention []  - 0 Lower Extremity Assessment (monofilament, tuning fork, pulses) []  - 0 Peripheral Arterial Disease Assessment (using hand held doppler) ASSESSMENTS - Ostomy and/or Continence Assessment and Care []  - Incontinence Assessment and Management 0 []  - 0 Ostomy Care Assessment and Management (repouching, etc.) PROCESS - Coordination of Care []  - Simple Patient / Family Education for ongoing care 0 []  - 0 Complex (extensive) Patient / Family Education for ongoing care []  - 0 Staff obtains Programmer, systems, Records, Test Results / Process Orders []  - 0 Staff telephones HHA, Nursing Homes / Clarify orders / etc []  - 0 Routine Transfer to another Facility (non-emergent condition) []  - 0 Routine Hospital Admission (non-emergent condition) []  - 0 New Admissions / Biomedical engineer / Ordering NPWT, Apligraf, etc. []  - 0 Emergency Hospital Admission (emergent condition) X- 1 10 Simple Discharge Coordination [name], [name] ([id number]) []  - 0 Complex (extensive) Discharge Coordination PROCESS - Special Needs []  - Pediatric / Minor Patient Management 0 []  - 0 Isolation Patient Management []  - 0 Hearing / Language / Visual special needs []  - 0 Assessment of Community assistance (transportation, D/C planning, etc.) []  - 0 Additional  assistance / Altered mentation []  - 0 Support Surface(s) Assessment (bed, cushion, seat, etc.) INTERVENTIONS - Wound Cleansing / Measurement X - Simple Wound Cleansing - one wound 1 5 []  -  0 Complex Wound Cleansing - multiple wounds X- 1 5 Wound Imaging (photographs - any number of wounds) []  - 0 Wound Tracing (instead of photographs) X- 1 5 Simple Wound Measurement - one wound []  - 0 Complex Wound Measurement - multiple wounds INTERVENTIONS - Wound Dressings []  - Small Wound Dressing one or multiple wounds 0 X- 1 15 Medium Wound Dressing one or multiple wounds []  - 0 Large Wound Dressing one or multiple wounds []  - 0 Application of Medications - topical []  - 0 Application of Medications - injection INTERVENTIONS - Miscellaneous []  - External ear exam 0 []  - 0 Specimen Collection (cultures, biopsies, blood, body fluids, etc.) []  - 0 Specimen(s) / Culture(s) sent or taken to Lab for analysis []  - 0 Patient Transfer (multiple staff / Civil Service fast streamer / Similar devices) []  - 0 Simple Staple / Suture removal (25 or less) []  - 0 Complex Staple / Suture removal (26 or more) []  - 0 Hypo / Hyperglycemic Management (close monitor of Blood Glucose) []  - 0 Ankle / Brachial Index (ABI) - do not check if billed separately X- 1 5 Vital Signs [place], [name] ([id number]) Has the patient been seen at the hospital within the last three years: Yes Total Score: 60 Level Of Care: New/Established - Level 2 Electronic Signature(s) Signed: 04/18/2019 3:23:54 PM By: Gretta Cool, BSN, [name], [name], [name], BSN Entered By: Gretta Cool, BSN, [name], [name], Fred on 04/18/2019 08:27:35 Sivils, [name] ([id number]) -------------------------------------------------------------------------------- Encounter Discharge Information Details Patient Name: [name]. Date of Service: 04/18/2019 8:00 AM Medical Record Number: [id number] Patient Account Number: [id number] Date of Birth/Sex: [birth year] (73  y.o. M) Treating [name]: [name] Primary Care Kelvon Giannini: [name] Other Clinician: Referring Juancarlos Crescenzo: [name] Treating Bibiana Gillean/Extender: Melburn Hake, [name] Weeks in Treatment: 18 Encounter Discharge Information Items Discharge Condition: Stable Ambulatory Status: Ambulatory Discharge Destination: Home Transportation: Private Auto Accompanied By: self Schedule Follow-up Appointment: Yes Clinical Summary of Care: Electronic Signature(s) Signed: 04/18/2019 3:23:54 PM By: Gretta Cool, BSN, [name], [name], [name], BSN Entered By: Gretta Cool, BSN, [name], [name], Fred on 04/18/2019 78:24:23 [name] ([id number]) -------------------------------------------------------------------------------- Lower Extremity Assessment Details Patient Name: [name], KITCHINGS. Date of Service: 04/18/2019 8:00 AM Medical Record Number: [id number] Patient Account Number: [id number] Date of Birth/Sex: Jun 16, 1946 (73 y.o. M) Treating [name]: [name] Primary Care Nazim Kadlec: [name] Other Clinician: Referring Medford Staheli: [name] Treating Nadezhda Pollitt/Extender: Melburn Hake, [name] Weeks in Treatment: 18 Vascular Assessment Pulses: Dorsalis Pedis Palpable: [Left:Yes] Electronic Signature(s) Signed: 04/18/2019 3:23:54 PM By: Gretta Cool, BSN, [name], [name], [name], BSN Entered By: Gretta Cool, BSN, [name], [name], Fred on 04/18/2019 08:12:55 [place]addle, [name] ([id number]) -------------------------------------------------------------------------------- Multi Wound Chart Details Patient Name: [name] Date of Service: 04/18/2019 8:00 AM Medical Record Number: [id number] Patient Account Number: [id number] Date of Birth/Sex: [birth year] (73 y.o. M) Treating [name]: [name] Primary Care Darien Mignogna: [name] Other Clinician: Referring Albertine Lafoy: [name] Treating Ramsay Bognar/Extender: [name], [name] Weeks in Treatment: 18 Vital Signs Height(in): 72 Pulse(bpm): 76 Weight(lbs): 250 Blood Pressure(mmHg):  178/69 Body Mass Index(BMI): 34 Temperature(F): 98.0 Respiratory Rate 16 (breaths/min): Photos: [N/A:N/A] Wound Location: Left Toe Great N/A N/A Wounding Event: Gradually Appeared N/A N/A Primary Etiology: Diabetic Wound/Ulcer of the N/A N/A Lower Extremity Comorbid History: Hypertension, Type II Diabetes N/A N/A Date Acquired: 11/30/2017 N/A N/A Weeks of Treatment: 18 N/A N/A Wound Status: Open N/A N/A Wound Recurrence: Yes N/A N/A Measurements L x W x D 0.4x0.5x0.3 N/A N/A (cm) Area (cm) : 0.157 N/A N/A Volume (cm) :  0.047 N/A N/A % Reduction in Area: 75.30% N/A N/A % Reduction in Volume: 26.60% N/A N/A Classification: Grade 2 N/A N/A Exudate Amount: Medium N/A N/A Exudate Type: Serosanguineous N/A N/A Exudate Color: red, brown N/A N/A Wound Margin: Thickened N/A N/A Granulation Amount: Large (67-100%) N/A N/A Granulation Quality: Pale N/A N/A Necrotic Amount: Small (1-33%) N/A N/A Exposed Structures: Fat Layer (Subcutaneous N/A N/A Tissue) Exposed: Yes Fascia: No Tendon: No Muscle: No [name], [name] ([id number]) Joint: No Bone: No Epithelialization: Large (67-100%) N/A N/A Periwound Skin Texture: Callus: Yes N/A N/A Excoriation: No Induration: No Crepitus: No Rash: No Scarring: No Periwound Skin Moisture: Maceration: No N/A N/A Dry/Scaly: No Periwound Skin Color: Atrophie Blanche: No N/A N/A Cyanosis: No Ecchymosis: No Erythema: No Hemosiderin Staining: No Mottled: No Pallor: No Rubor: No Temperature: No Abnormality N/A N/A Tenderness on Palpation: Yes N/A N/A Treatment Notes Electronic Signature(s) Signed: 04/18/2019 3:23:54 PM By: Gretta Cool, BSN, [name], [name], [name], BSN Entered By: Gretta Cool, BSN, [name], [name], Fred on 04/18/2019 08:23:01 Elizabeth, [name] ([id number]) -------------------------------------------------------------------------------- [place] Details Patient Name: [name], TEUSCHER. Date of Service:  04/18/2019 8:00 AM Medical Record Number: [id number] Patient Account Number: [id number] Date of Birth/Sex: 01/30/1946 (73 y.o. M) Treating [name]: [name] Primary Care Rikita Grabert: [name] Other Clinician: Referring Teandre Hamre: [name] Treating Skyleigh Windle/Extender: [name], [name] Weeks in Treatment: 18 Active Inactive Abuse / Safety / Falls / Self Care Management Nursing Diagnoses: Potential for falls Goals: Patient will remain injury free related to falls Date Initiated: 01/19/2019 Target Resolution Date: 07/01/2019 Goal Status: Active Interventions: Assess fall risk on admission and as needed Notes: Peripheral Neuropathy Nursing Diagnoses: Knowledge deficit related to disease process and management of peripheral neurovascular dysfunction Goals: Patient/caregiver will verbalize understanding of disease process and disease management Date Initiated: 03/17/2019 Target Resolution Date: 06/03/2019 Goal Status: Active Interventions: Assess signs and symptoms of neuropathy upon admission and as needed Provide education on Management of Neuropathy and Related Ulcers Notes: Wound/Skin Impairment Nursing Diagnoses: Impaired tissue integrity Goals: Ulcer/skin breakdown will have a volume reduction of 30% by week 4 Date Initiated: 12/12/2018 Target Resolution Date: 02/25/2019 Goal Status: Active Interventions: AMAZIAH, RAISANEN ([id number]) Assess patient/caregiver ability to obtain necessary supplies Assess patient/caregiver ability to perform ulcer/skin care regimen upon admission and as needed Notes: Electronic Signature(s) Signed: 04/18/2019 3:23:54 PM By: Gretta Cool, BSN, [name], [name], [name], BSN Entered By: Gretta Cool, BSN, [name], [name], Fred on 04/18/2019 08:22:52 Imm, [name] ([id number]) -------------------------------------------------------------------------------- Pain Assessment Details Patient Name: [name]. Date of Service: 04/18/2019 8:00 AM Medical  Record Number: [id number] Patient Account Number: [id number] Date of Birth/Sex: 1946/08/01 (73 y.o. M) Treating [name]: [name] Primary Care Trew Sunde: [name] Other Clinician: Referring Novelle Addair: [name] Treating Presley Gora/Extender: [name], [name] Weeks in Treatment: 18 Active Problems Location of Pain Severity and Description of Pain Patient Has Paino No Site Locations Pain Management and Medication Current Pain Management: Notes Patient denies pain at this time. Electronic Signature(s) Signed: 04/18/2019 3:23:54 PM By: Gretta Cool, BSN, [name], [name], [name], BSN Entered By: Gretta Cool, BSN, [name], [name], Fred on 04/18/2019 08:06:52 [name] ([id number]) -------------------------------------------------------------------------------- Patient/Caregiver Education Details Patient Name: ANVITH, MAURIELLO Date of Service: 04/18/2019 8:00 AM Medical Record Number: [id number] Patient Account Number: [id number] Date of Birth/Gender: 12-30-1945 (73 y.o. M) Treating [name]: [name] Primary Care Physician: [name] Other Clinician: Referring Physician: Glendon [name] Treating Physician/Extender: Sharalyn Ink in Treatment: 18 Education Assessment Education Provided To: Patient Education Topics Provided Offloading:  Handouts: What is Offloadingo, Other: Front-offloading shoe Methods: Demonstration, Explain/Verbal Responses: State content correctly Pressure: Handouts: Pressure Ulcers: Care and Offloading Methods: Demonstration, Explain/Verbal Responses: State content correctly Electronic Signature(s) Signed: 04/18/2019 3:23:54 PM By: Gretta Cool, BSN, [name], [name], [name], BSN Entered By: Gretta Cool, BSN, [name], [name], Fred on 04/18/2019 08:28:19 [name] ([id number]) -------------------------------------------------------------------------------- Wound Assessment Details Patient Name: TERRIN, MEDDAUGH. Date of Service: 04/18/2019 8:00 AM Medical Record Number:  [id number] Patient Account Number: [id number] Date of Birth/Sex: [birth year] (73 y.o. M) Treating [name]: [name] Primary Care Cheick Suhr: [name] Other Clinician: Referring Jamel Dunton: [name] Treating Brandi Armato/Extender: [name], [name] Weeks in Treatment: 18 Wound Status Wound Number: [id number] Primary Etiology: Diabetic Wound/Ulcer of the Lower Extremity Wound Location: Left Toe Great Wound Status: Open Wounding Event: Gradually Appeared Comorbid Hypertension, Type II Diabetes Date Acquired: 11/30/2017 History: Weeks Of Treatment: 18 Clustered Wound: No Photos Wound Measurements Length: (cm) 0.4 % Reduction in Width: (cm) 0.5 % Reduction in Depth: (cm) 0.3 Epithelializati Area: (cm) 0.157 Tunneling: Volume: (cm) 0.047 Undermining: Area: 75.3% Volume: 26.6% on: Large (67-100%) No No Wound Description Classification: Grade 2 Foul Odor After Wound Margin: Thickened Slough/Fibrino Exudate Amount: Medium Exudate Type: Serosanguineous Exudate Color: red, brown Cleansing: No Yes Wound Bed Granulation Amount: Large (67-100%) Exposed Structure Granulation Quality: Pale Fascia Exposed: No Necrotic Amount: Small (1-33%) Fat Layer (Subcutaneous Tissue) Exposed: Yes Necrotic Quality: Adherent Slough Tendon Exposed: No Muscle Exposed: No Joint Exposed: No Bone Exposed: No Periwound Skin Texture Texture Color Yeley, Revere L. ([id number]) No Abnormalities Noted: No No Abnormalities Noted: No Callus: Yes Atrophie Blanche: No Crepitus: No Cyanosis: No Excoriation: No Ecchymosis: No Induration: No Erythema: No Rash: No Hemosiderin Staining: No Scarring: No Mottled: No Pallor: No Moisture Rubor: No No Abnormalities Noted: No Dry / Scaly: No Temperature / Pain Maceration: No Temperature: No Abnormality Tenderness on Palpation: Yes Treatment Notes Wound #1R (Left Toe Great) 1. Cleansed with: Clean wound with Normal Saline 2. Anesthetic Topical  Lidocaine 4% cream to wound bed prior to debridement 4. Dressing Applied: Prisma Ag 5. Secondary Dressing Applied Gauze and Kerlix/Conform Notes prisma, foam, conform and tape, off-loading shoe Electronic Signature(s) Signed: 04/18/2019 3:23:54 PM By: Gretta Cool, BSN, [name], [name], [name], BSN Entered By: Gretta Cool, BSN, [name], [name], Fred on 04/18/2019 08:12:40 Phaneuf, [name] ([id number]) -------------------------------------------------------------------------------- [place] Details Patient Name: [name] Date of Service: 04/18/2019 8:00 AM Medical Record Number: [id number] Patient Account Number: [id number] Date of Birth/Sex: 1946-05-16 (73 y.o. M) Treating [name]: [name] Primary Care Naz Denunzio: [name] Other Clinician: Referring Loyed Wilmes: [name] Treating Windy Dudek/Extender: [name], [name] Weeks in Treatment: 18 Vital Signs Time Taken: 08:05 Temperature (F): 98.0 Height (in): 72 Pulse (bpm): 76 Weight (lbs): 250 Respiratory Rate (breaths/min): 16 Body Mass Index (BMI): 33.9 Blood Pressure (mmHg): 178/69 Reference Range: 80 - 120 mg / dl Notes Patient states he has not taken his BP meds today. Electronic Signature(s) Signed: 04/18/2019 3:23:54 PM By: Gretta Cool, BSN, [name], [name], [name], BSN Entered By: Gretta Cool, BSN, [name], [name], Fred on 04/18/2019 08:09:02

## 2019-04-25 ENCOUNTER — Other Ambulatory Visit: Payer: Self-pay

## 2019-04-25 ENCOUNTER — Encounter: Payer: Medicare Other | Admitting: Physician Assistant

## 2019-04-25 DIAGNOSIS — E11621 Type 2 diabetes mellitus with foot ulcer: Secondary | ICD-10-CM | POA: Diagnosis not present

## 2019-04-26 NOTE — Progress Notes (Signed)
[name] ([id number]) Visit Report for 04/25/2019 Chief Complaint Document Details Patient Name: [name], [name]. Date of Service: 04/25/2019 8:00 AM Medical Record Number: [id number] Patient Account Number: [id number] Date of Birth/Sex: 1946-07-29 (73 y.o. M) Treating RN: Montey Hora Primary Care Provider: Glendon Axe Other Clinician: Referring Provider: Glendon Axe Treating Provider/Extender: STONE III, HOYT Weeks in Treatment: 19 Information Obtained from: Patient Chief Complaint Left great toe ulcer Electronic Signature(s) Signed: 04/26/2019 7:59:38 AM By: Worthy Keeler PA-C Entered By: Worthy Keeler on 04/25/2019 08:26:37 [name], [name] ([id number]) -------------------------------------------------------------------------------- Debridement Details Patient Name: [name]. Date of Service: 04/25/2019 8:00 AM Medical Record Number: [id number] Patient Account Number: [id number] Date of Birth/Sex: 1946/10/07 (73 y.o. M) Treating RN: Montey Hora Primary Care Provider: Glendon Axe Other Clinician: Referring Provider: Glendon Axe Treating Provider/Extender: STONE III, HOYT Weeks in Treatment: 19 Debridement Performed for Wound #1R Left Toe Great Assessment: Performed By: Physician STONE III, HOYT E., PA-C Debridement Type: Debridement Severity of Tissue Pre Fat layer exposed Debridement: Level of Consciousness (Pre- Awake and Alert procedure): Pre-procedure Verification/Time Yes - 08:33 Out Taken: Start Time: 08:33 Pain Control: Lidocaine 4% Topical Solution Total Area Debrided (L x W): 0.3 (cm) x 0.4 (cm) = 0.12 (cm) Tissue and other material Callus, Slough, Subcutaneous, Slough debrided: Level: Skin/Subcutaneous Tissue Debridement Description: Excisional Instrument: Curette Bleeding: Minimum Hemostasis Achieved: Pressure End Time: 08:37 Procedural Pain: 0 Post Procedural Pain: 0 Response to Treatment:  Procedure was tolerated well Level of Consciousness Awake and Alert (Post-procedure): Post Debridement Measurements of Total Wound Length: (cm) 0.4 Width: (cm) 0.4 Depth: (cm) 0.3 Volume: (cm) 0.038 Character of Wound/Ulcer Post Debridement: Improved Severity of Tissue Post Debridement: Fat layer exposed Post Procedure Diagnosis Same as Pre-procedure Electronic Signature(s) Signed: 04/25/2019 3:19:36 PM By: Montey Hora Signed: 04/26/2019 7:59:38 AM By: Worthy Keeler PA-C Entered By: Montey Hora on 04/25/2019 08:38:32 [name], [name] ([id number]) -------------------------------------------------------------------------------- HPI Details Patient Name: [name] Date of Service: 04/25/2019 8:00 AM Medical Record Number: [id number] Patient Account Number: [id number] Date of Birth/Sex: 10/20/1946 (73 y.o. M) Treating RN: Montey Hora Primary Care Provider: Glendon Axe Other Clinician: Referring Provider: Glendon Axe Treating Provider/Extender: Melburn Hake, HOYT Weeks in Treatment: 19 History of Present Illness HPI Description: 12/12/18 on evaluation today patient presents as a referral from his endocrinologist regarding issues that he has been having with his left great toe. Subsequently he has been seeing Dr. Cleda Mccreedy and Dr. Cleda Mccreedy has been the breeding the wound and in fact this was debrided this morning. The patient had an appointment there prior to coming here. Subsequently he states that even though he's been going for regular debridement after office things really do not seem to be showing signs of improvement unfortunately. He states that he is having some discomfort but nothing too significant. No fevers, chills, nausea, or vomiting noted at this time. The patient does have a history of hypertension, neuropathy, diabetes mellitus type II, and a history of stroke. Currently he's been using bacitracin on the toe and utilizing a Band-Aid. He tells me  he's had this wound for two years. He cannot remember the last time he had an x-ray. 12/19/18 on evaluation today patient appears to be doing well in regard to his plantar toe ulcer. He's been tolerating the dressing changes without complication. Fortunately there does not appear to be signs of infection at this time which is good news. No fever chills noted. 01/19/19 has been one month since I last saw the  patient as he was out of town. With that being said on evaluation today it does appear that he is going to require sharp debridement and I do believe that the total contact cast would benefit him as far as being applied at this time. He is in agreement that plan. 01/23/19 on evaluation today patient appears to be doing better in regard to his left great toe ulcer. He has been shown signs of improvement week by week and although this is slow it does seem to be doing fairly well which is good news. Overall very pleased in this regard. 01/31/19 on evaluation today patient presents for follow-up concerning his great toe ulcer. He was actually supposed to be here yesterday and he did come however he ended up having to leave due to his blood sugar dropping he states he just did not eat enough lunch. Fortunately seems to be doing much better today which is excellent news. Fortunately there's no evidence of infection at this time which is also good news. No fevers, chills, nausea, or vomiting noted at this time. Overall I feel like he is making excellent progress. 02/06/19 on evaluation today patient actually appears to be doing very well in regard to his plantar toe ulcer. He's been tolerating the dressing changes without complication. Fortunately there is no sign of infection at this time. Overall been very pleased with how things seem to be progressing. No fevers, chills, nausea, or vomiting noted at this time. 02/13/19 on evaluation today patient actually appears to be doing excellent in regard to his  toe ulcer which is almost completely close. Overall very pleased with that. There does not appear to be any signs of infection which is good news. Upon close inspection it appears that he has just a very slight opening still noted at the central portion of the toe although this is minimal. 02/16/19 on evaluation today patient is seen for early follow-up due to the fact that he tells me while at home he actually got up to go answer the door without putting on the walking boot part of his cast and subsequently damaged the total contact cast. It therefore comes in to have this removed and potentially replaced. Fortunately other than it given him a little bit of pain its far as pushing in on some areas there doesn't appear to be any injury once the cast was removed. 03/13/19 on evaluation today patient unfortunately presents for follow-up concerning his great toe ulcer on the left. He was discharged on 02/16/19 with the wound healed at that point. He tells me this remain so for several weeks or least a couple weeks before reopening. Fortunately there does not appear to be any signs of infection at this time. Nonetheless for the past week at least he's had the wound reopened and states it is been trying to keep pressure off of this but he has not been using the offloading shoe we previously gave him. Fortunately there's no evidence of infection at this time. No fevers, chills, nausea, or vomiting noted at this time. [name], [name] ([id number]) 03/17/19 on evaluation today patient actually appears to be doing rather well in regard to his toe also. Fact this appears to be significantly smaller this week even compared to what I saw last week on evaluation. He seems to have done very well for the offloading shoe and overall I'm very pleased with the progress that is made. It may be that he doesn't even need the cast to get this  to heal if he offloads this appropriately. 03/24/19 on evaluation today  patient actually appears to be doing well in regard to his plantar foot ulcer. He's been tolerating the dressing changes without complication the collagen does seem to be beneficial for him. With that being said he is gonna require some miles sharp debridement today to clear away some of the callous's around the edge of the wound as well as the minimal Slough noted on the surface of the wound. 03/31/19 on evaluation today patient appears to be doing well in regard to his plantar great toe ulcer. He has been tolerating the dressing changes without complication which is good news. Fortunately there does not appear to be any signs of active infection at this time. Overall very pleased with how things seem to be progressing. 04/11/19 on evaluation today patient's tools are appears to be doing in my pinion roughly about the same as were things that previous. Fortunately there does not appear to be signs of active infection at this time which is good news. With that being said he has not been experiencing any pain at this time which is good news there is also No fevers, chills, nausea, or vomiting noted at this time. 04/18/19 on evaluation today patient's wound actually appears to be doing fairly well today he did not even really have a lot of callous buildup. We have been debating on whether or not to reinitiate total contact cast. With that being said he seems to be doing fairly well this point with offloading in my pinion and again I think we need to come up with a way to get this healed that will also be sustainable for keeping it close. Again we were able to close it with the cast previous but again he has to be able to keep it such. For that reason we are working on trying to get him diabetic shoes he's waiting for the [place] to get in touch with the local company that has to measure him for these do not want to have a cast on at such a time as when they need to actually measure him. 04/25/19 on evaluation  today patient appears to be doing well in regard to his plantar foot specifically great toe ulcer. He's been tolerating the dressing changes without complication. Fortunately there's no signs of active infection at this time. Overall I'm very pleased with how things appear currently. Electronic Signature(s) Signed: 04/26/2019 7:59:38 AM By: Worthy Keeler PA-C Entered By: Worthy Keeler on 04/25/2019 11:20:14 [name], [name] ([id number]) -------------------------------------------------------------------------------- Physical Exam Details Patient Name: [name] Date of Service: 04/25/2019 8:00 AM Medical Record Number: [id number] Patient Account Number: [id number] Date of Birth/Sex: 22-Nov-1946 (73 y.o. M) Treating RN: Montey Hora Primary Care Provider: Glendon Axe Other Clinician: Referring Provider: Glendon Axe Treating Provider/Extender: STONE III, HOYT Weeks in Treatment: 9 Constitutional Well-nourished and well-hydrated in no acute distress. Respiratory normal breathing without difficulty. Psychiatric this patient is able to make decisions and demonstrates good insight into disease process. Alert and Oriented x 3. pleasant and cooperative. Notes Patient's wound bed again did have some callous around the edge of the wound and though the central portion of the wound appears to be okay I'm not sure that we're seeing as much improvement as I would like to see as far as getting this to completely close. For that reason I think we may want to consider proceeding back to the total contact cast the patient is in agreement that  plan. Therefore after discussion with the patient today I did go ahead and debride the wound to prepare for application the total contact cast which was performed today as well. Patient tolerated this without complication. Electronic Signature(s) Signed: 04/26/2019 7:59:38 AM By: Worthy Keeler PA-C Entered By: Worthy Keeler on  04/25/2019 11:20:57 [name], [name] ([id number]) -------------------------------------------------------------------------------- Physician Orders Details Patient Name: [name] Date of Service: 04/25/2019 8:00 AM Medical Record Number: [id number] Patient Account Number: [id number] Date of Birth/Sex: Dec 13, 1946 (73 y.o. M) Treating RN: Montey Hora Primary Care Provider: Glendon Axe Other Clinician: Referring Provider: Glendon Axe Treating Provider/Extender: Melburn Hake, HOYT Weeks in Treatment: 27 Verbal / Phone Orders: No Diagnosis Coding ICD-10 Coding Code Description E11.621 Type 2 diabetes mellitus with foot ulcer L97.522 Non-pressure chronic ulcer of other part of left foot with fat layer exposed I10 Essential (primary) hypertension E11.43 Type 2 diabetes mellitus with diabetic autonomic (poly)neuropathy Wound Cleansing Wound #1R Left Toe Great o Clean wound with Normal Saline. o May Shower, gently pat wound dry prior to applying new dressing. Primary Wound Dressing Wound #1R Left Toe Great o Silver Collagen Secondary Dressing Wound #1R Left Toe Great o Foam Dressing Change Frequency Wound #1R Left Toe Great o Change dressing every week Follow-up Appointments Wound #1R Left Toe Great o Return Appointment in 1 week. Off-Loading Wound #1R Left Toe Great o Total Contact Cast to Left Lower Extremity Electronic Signature(s) Signed: 04/25/2019 3:19:36 PM By: Montey Hora Signed: 04/26/2019 7:59:38 AM By: Worthy Keeler PA-C Entered By: Montey Hora on 04/25/2019 08:40:46 [place], [name] ([id number]) -------------------------------------------------------------------------------- Problem List Details Patient Name: DERIUS, GHOSH. Date of Service: 04/25/2019 8:00 AM Medical Record Number: [id number] Patient Account Number: [id number] Date of Birth/Sex: 1946-08-11 (73 y.o. M) Treating RN: Montey Hora Primary  Care Provider: Glendon Axe Other Clinician: Referring Provider: Glendon Axe Treating Provider/Extender: Melburn Hake, HOYT Weeks in Treatment: 19 Active Problems ICD-10 Evaluated Encounter Code Description Active Date Today Diagnosis E11.621 Type 2 diabetes mellitus with foot ulcer 12/12/2018 No Yes L97.522 Non-pressure chronic ulcer of other part of left foot with fat 12/12/2018 No Yes layer exposed I10 Essential (primary) hypertension 12/12/2018 No Yes E11.43 Type 2 diabetes mellitus with diabetic autonomic (poly) 12/12/2018 No Yes neuropathy Inactive Problems Resolved Problems Electronic Signature(s) Signed: 04/26/2019 7:59:38 AM By: Worthy Keeler PA-C Entered By: Worthy Keeler on 04/25/2019 78:93:81 [name] ([id number]) -------------------------------------------------------------------------------- Progress Note Details Patient Name: [name] Date of Service: 04/25/2019 8:00 AM Medical Record Number: [id number] Patient Account Number: [id number] Date of Birth/Sex: 09/17/1946 (73 y.o. M) Treating RN: Montey Hora Primary Care Provider: Glendon Axe Other Clinician: Referring Provider: Glendon Axe Treating Provider/Extender: Melburn Hake, HOYT Weeks in Treatment: 19 Subjective Chief Complaint Information obtained from Patient Left great toe ulcer History of Present Illness (HPI) 12/12/18 on evaluation today patient presents as a referral from his endocrinologist regarding issues that he has been having with his left great toe. Subsequently he has been seeing Dr. Cleda Mccreedy and Dr. Cleda Mccreedy has been the breeding the wound and in fact this was debrided this morning. The patient had an appointment there prior to coming here. Subsequently he states that even though he's been going for regular debridement after office things really do not seem to be showing signs of improvement unfortunately. He states that he is having some discomfort but nothing  too significant. No fevers, chills, nausea, or vomiting noted at this time. The patient does have a history of hypertension, neuropathy,  diabetes mellitus type II, and a history of stroke. Currently he's been using bacitracin on the toe and utilizing a Band-Aid. He tells me he's had this wound for two years. He cannot remember the last time he had an x-ray. 12/19/18 on evaluation today patient appears to be doing well in regard to his plantar toe ulcer. He's been tolerating the dressing changes without complication. Fortunately there does not appear to be signs of infection at this time which is good news. No fever chills noted. 01/19/19 has been one month since I last saw the patient as he was out of town. With that being said on evaluation today it does appear that he is going to require sharp debridement and I do believe that the total contact cast would benefit him as far as being applied at this time. He is in agreement that plan. 01/23/19 on evaluation today patient appears to be doing better in regard to his left great toe ulcer. He has been shown signs of improvement week by week and although this is slow it does seem to be doing fairly well which is good news. Overall very pleased in this regard. 01/31/19 on evaluation today patient presents for follow-up concerning his great toe ulcer. He was actually supposed to be here yesterday and he did come however he ended up having to leave due to his blood sugar dropping he states he just did not eat enough lunch. Fortunately seems to be doing much better today which is excellent news. Fortunately there's no evidence of infection at this time which is also good news. No fevers, chills, nausea, or vomiting noted at this time. Overall I feel like he is making excellent progress. 02/06/19 on evaluation today patient actually appears to be doing very well in regard to his plantar toe ulcer. He's been tolerating the dressing changes without complication.  Fortunately there is no sign of infection at this time. Overall been very pleased with how things seem to be progressing. No fevers, chills, nausea, or vomiting noted at this time. 02/13/19 on evaluation today patient actually appears to be doing excellent in regard to his toe ulcer which is almost completely close. Overall very pleased with that. There does not appear to be any signs of infection which is good news. Upon close inspection it appears that he has just a very slight opening still noted at the central portion of the toe although this is minimal. 02/16/19 on evaluation today patient is seen for early follow-up due to the fact that he tells me while at home he actually got up to go answer the door without putting on the walking boot part of his cast and subsequently damaged the total contact cast. It therefore comes in to have this removed and potentially replaced. Fortunately other than it given him a little bit of pain its far as pushing in on some areas there doesn't appear to be any injury once the cast was removed. [name], [name] ([id number]) 03/13/19 on evaluation today patient unfortunately presents for follow-up concerning his great toe ulcer on the left. He was discharged on 02/16/19 with the wound healed at that point. He tells me this remain so for several weeks or least a couple weeks before reopening. Fortunately there does not appear to be any signs of infection at this time. Nonetheless for the past week at least he's had the wound reopened and states it is been trying to keep pressure off of this but he has not been using the  offloading shoe we previously gave him. Fortunately there's no evidence of infection at this time. No fevers, chills, nausea, or vomiting noted at this time. 03/17/19 on evaluation today patient actually appears to be doing rather well in regard to his toe also. Fact this appears to be significantly smaller this week even compared to what I saw  last week on evaluation. He seems to have done very well for the offloading shoe and overall I'm very pleased with the progress that is made. It may be that he doesn't even need the cast to get this to heal if he offloads this appropriately. 03/24/19 on evaluation today patient actually appears to be doing well in regard to his plantar foot ulcer. He's been tolerating the dressing changes without complication the collagen does seem to be beneficial for him. With that being said he is gonna require some miles sharp debridement today to clear away some of the callous's around the edge of the wound as well as the minimal Slough noted on the surface of the wound. 03/31/19 on evaluation today patient appears to be doing well in regard to his plantar great toe ulcer. He has been tolerating the dressing changes without complication which is good news. Fortunately there does not appear to be any signs of active infection at this time. Overall very pleased with how things seem to be progressing. 04/11/19 on evaluation today patient's tools are appears to be doing in my pinion roughly about the same as were things that previous. Fortunately there does not appear to be signs of active infection at this time which is good news. With that being said he has not been experiencing any pain at this time which is good news there is also No fevers, chills, nausea, or vomiting noted at this time. 04/18/19 on evaluation today patient's wound actually appears to be doing fairly well today he did not even really have a lot of callous buildup. We have been debating on whether or not to reinitiate total contact cast. With that being said he seems to be doing fairly well this point with offloading in my pinion and again I think we need to come up with a way to get this healed that will also be sustainable for keeping it close. Again we were able to close it with the cast previous but again he has to be able to keep it such. For  that reason we are working on trying to get him diabetic shoes he's waiting for the [place] to get in touch with the local company that has to measure him for these do not want to have a cast on at such a time as when they need to actually measure him. 04/25/19 on evaluation today patient appears to be doing well in regard to his plantar foot specifically great toe ulcer. He's been tolerating the dressing changes without complication. Fortunately there's no signs of active infection at this time. Overall I'm very pleased with how things appear currently. Patient History Information obtained from Patient. Family History Diabetes - Mother, Heart Disease - Father, Stroke - Father, No family history of Cancer, Hereditary Spherocytosis, Hypertension, Kidney Disease, Lung Disease, Seizures, Thyroid Problems, Tuberculosis. Social History Never smoker, Marital Status - Widowed, Alcohol Use - Never, Drug Use - No History, Caffeine Use - Moderate. Medical History Eyes Denies history of Cataracts, Glaucoma, Optic Neuritis Ear/Nose/Mouth/Throat Denies history of Chronic sinus problems/congestion, Middle ear problems Hematologic/Lymphatic Denies history of Anemia, Hemophilia, Human Immunodeficiency Virus, Lymphedema Respiratory Denies history of  Aspiration, Asthma, Chronic Obstructive Pulmonary Disease (COPD), Pneumothorax, Sleep Apnea, Tuberculosis Henshaw, TERRIS GERMANO ([id number]) Cardiovascular Patient has history of Hypertension Denies history of Angina, Arrhythmia, Congestive Heart Failure, Coronary Artery Disease, Deep Vein Thrombosis, Hypotension, Myocardial Infarction, Peripheral Arterial Disease, Peripheral Venous Disease, Phlebitis, Vasculitis Gastrointestinal Denies history of Cirrhosis , Colitis, Crohn s, Hepatitis A, Hepatitis B, Hepatitis C Endocrine Patient has history of Type II Diabetes Denies history of Type I Diabetes Genitourinary Denies history of End Stage Renal  Disease Immunological Denies history of Lupus Erythematosus, Raynaud s, Scleroderma Integumentary (Skin) Denies history of History of Burn, History of pressure wounds Musculoskeletal Denies history of Gout, Rheumatoid Arthritis, Osteoarthritis, Osteomyelitis Neurologic Denies history of Dementia, Neuropathy, Quadriplegia, Paraplegia, Seizure Disorder Oncologic Denies history of Received Chemotherapy, Received Radiation Review of Systems (ROS) Constitutional Symptoms (General Health) Denies complaints or symptoms of Fatigue, Fever, Chills, Marked Weight Change. Respiratory Denies complaints or symptoms of Chronic or frequent coughs, Shortness of Breath. Cardiovascular Denies complaints or symptoms of Chest pain, LE edema. Psychiatric Denies complaints or symptoms of Anxiety, Claustrophobia. Objective Constitutional Well-nourished and well-hydrated in no acute distress. Vitals Time Taken: 8:12 AM, Height: 72 in, Weight: 250 lbs, BMI: 33.9, Temperature: 97.7 F, Pulse: 77 bpm, Respiratory Rate: 16 breaths/min, Blood Pressure: 171/80 mmHg. Respiratory normal breathing without difficulty. Psychiatric this patient is able to make decisions and demonstrates good insight into disease process. Alert and Oriented x 3. pleasant and cooperative. General Notes: Patient's wound bed again did have some callous around the edge of the wound and though the central portion of the wound appears to be okay I'm not sure that we're seeing as much improvement as I would like to see as far as [name], [name]. ([id number]) getting this to completely close. For that reason I think we may want to consider proceeding back to the total contact cast the patient is in agreement that plan. Therefore after discussion with the patient today I did go ahead and debride the wound to prepare for application the total contact cast which was performed today as well. Patient tolerated this without  complication. Integumentary (Hair, Skin) Wound #1R status is Open. Original cause of wound was Gradually Appeared. The wound is located on the Left Toe Great. The wound measures 0.3cm length x 0.4cm width x 0.2cm depth; 0.094cm^2 area and 0.019cm^3 volume. There is Fat Layer (Subcutaneous Tissue) Exposed exposed. There is no tunneling or undermining noted. There is a medium amount of serosanguineous drainage noted. The wound margin is thickened. There is large (67-100%) pale granulation within the wound bed. There is a small (1-33%) amount of necrotic tissue within the wound bed including Adherent Slough. The periwound skin appearance exhibited: Callus. The periwound skin appearance did not exhibit: Crepitus, Excoriation, Induration, Rash, Scarring, Dry/Scaly, Maceration, Atrophie Blanche, Cyanosis, Ecchymosis, Hemosiderin Staining, Mottled, Pallor, Rubor, Erythema. Periwound temperature was noted as No Abnormality. The periwound has tenderness on palpation. Assessment Active Problems ICD-10 Type 2 diabetes mellitus with foot ulcer Non-pressure chronic ulcer of other part of left foot with fat layer exposed Essential (primary) hypertension Type 2 diabetes mellitus with diabetic autonomic (poly)neuropathy Procedures Wound #1R Pre-procedure diagnosis of Wound #1R is a Diabetic Wound/Ulcer of the Lower Extremity located on the Left Toe Great .Severity of Tissue Pre Debridement is: Fat layer exposed. There was a Excisional Skin/Subcutaneous Tissue Debridement with a total area of 0.12 sq cm performed by STONE III, HOYT E., PA-C. With the following instrument(s): Curette Material removed includes Callus, Subcutaneous Tissue, and Slough after  achieving pain control using Lidocaine 4% Topical Solution. No specimens were taken. A time out was conducted at 08:33, prior to the start of the procedure. A Minimum amount of bleeding was controlled with Pressure. The procedure was tolerated well with a pain  level of 0 throughout and a pain level of 0 following the procedure. Post Debridement Measurements: 0.4cm length x 0.4cm width x 0.3cm depth; 0.038cm^3 volume. Character of Wound/Ulcer Post Debridement is improved. Severity of Tissue Post Debridement is: Fat layer exposed. Post procedure Diagnosis Wound #1R: Same as Pre-Procedure Pre-procedure diagnosis of Wound #1R is a Diabetic Wound/Ulcer of the Lower Extremity located on the Left Toe Great . There was a Total Contact Cast Procedure by STONE III, HOYT E., PA-C. Post procedure Diagnosis Wound #1R: Same as Pre-Procedure Plan [name], [name] ([id number]) Wound Cleansing: Wound #1R Left Toe Great: Clean wound with Normal Saline. May Shower, gently pat wound dry prior to applying new dressing. Primary Wound Dressing: Wound #1R Left Toe Great: Silver Collagen Secondary Dressing: Wound #1R Left Toe Great: Foam Dressing Change Frequency: Wound #1R Left Toe Great: Change dressing every week Follow-up Appointments: Wound #1R Left Toe Great: Return Appointment in 1 week. Off-Loading: Wound #1R Left Toe Great: Total Contact Cast to Left Lower Extremity My suggestion is gonna be that we go ahead and continue with the above wound care measures with the cast for the next week. He is in agreement with plan. We will subsequently see were things stand at follow-up. If anything changes or worsens in the meantime he will contact the office and let me know otherwise I hope that with the cast will be able to get this to reclose fairly quickly. Please see above for specific wound care orders. We will see patient for re-evaluation in 1 week(s) here in the clinic. If anything worsens or changes patient will contact our office for additional recommendations. Electronic Signature(s) Signed: 04/26/2019 7:59:38 AM By: Worthy Keeler PA-C Entered By: Worthy Keeler on 04/25/2019 11:21:27 [name]  ([id number]) -------------------------------------------------------------------------------- ROS/PFSH Details Patient Name: [name] Date of Service: 04/25/2019 8:00 AM Medical Record Number: [id number] Patient Account Number: [id number] Date of Birth/Sex: 13-Mar-1946 (73 y.o. M) Treating RN: Montey Hora Primary Care Provider: Glendon Axe Other Clinician: Referring Provider: Glendon Axe Treating Provider/Extender: STONE III, HOYT Weeks in Treatment: 19 Information Obtained From Patient Constitutional Symptoms (General Health) Complaints and Symptoms: Negative for: Fatigue; Fever; Chills; Marked Weight Change Respiratory Complaints and Symptoms: Negative for: Chronic or frequent coughs; Shortness of Breath Medical History: Negative for: Aspiration; Asthma; Chronic Obstructive Pulmonary Disease (COPD); Pneumothorax; Sleep Apnea; Tuberculosis Cardiovascular Complaints and Symptoms: Negative for: Chest pain; LE edema Medical History: Positive for: Hypertension Negative for: Angina; Arrhythmia; Congestive Heart Failure; Coronary Artery Disease; Deep Vein Thrombosis; Hypotension; Myocardial Infarction; Peripheral Arterial Disease; Peripheral Venous Disease; Phlebitis; Vasculitis Psychiatric Complaints and Symptoms: Negative for: Anxiety; Claustrophobia Eyes Medical History: Negative for: Cataracts; Glaucoma; Optic Neuritis Ear/Nose/Mouth/Throat Medical History: Negative for: Chronic sinus problems/congestion; Middle ear problems Hematologic/Lymphatic Medical History: Negative for: Anemia; Hemophilia; Human Immunodeficiency Virus; Lymphedema Gastrointestinal [name], [name] ([id number]) Medical History: Negative for: Cirrhosis ; Colitis; Crohnos; Hepatitis A; Hepatitis B; Hepatitis C Endocrine Medical History: Positive for: Type II Diabetes Negative for: Type I Diabetes Time with diabetes: 1990 Treated with: Insulin Blood sugar tested every day:  Yes Tested : Genitourinary Medical History: Negative for: End Stage Renal Disease Immunological Medical History: Negative for: Lupus Erythematosus; Raynaudos; Scleroderma Integumentary (Skin) Medical History: Negative for: History  of Burn; History of pressure wounds Musculoskeletal Medical History: Negative for: Gout; Rheumatoid Arthritis; Osteoarthritis; Osteomyelitis Neurologic Medical History: Negative for: Dementia; Neuropathy; Quadriplegia; Paraplegia; Seizure Disorder Oncologic Medical History: Negative for: Received Chemotherapy; Received Radiation Immunizations Pneumococcal Vaccine: Received Pneumococcal Vaccination: Yes Implantable Devices No devices added Family and Social History Cancer: No; Diabetes: Yes - Mother; Heart Disease: Yes - Father; Hereditary Spherocytosis: No; Hypertension: No; Kidney Disease: No; Lung Disease: No; Seizures: No; Stroke: Yes - Father; Thyroid Problems: No; Tuberculosis: No; Never smoker; Marital Status - Widowed; Alcohol Use: Never; Drug Use: No History; Caffeine Use: Moderate; Financial Concerns: No; Food, Clothing or Shelter Needs: No; Support System Lacking: No; Transportation Concerns: No Physician Affirmation I have reviewed and agree with the above information. [name], [name] ([id number]) Electronic Signature(s) Signed: 04/25/2019 3:19:36 PM By: Montey Hora Signed: 04/26/2019 7:59:38 AM By: Worthy Keeler PA-C Entered By: Worthy Keeler on 04/25/2019 11:20:33 Jansen, [name] ([id number]) -------------------------------------------------------------------------------- Total Contact Cast Details Patient Name: [name], [name] Date of Service: 04/25/2019 8:00 AM Medical Record Number: [id number] Patient Account Number: [id number] Date of Birth/Sex: [birth year] (73 y.o. M) Treating RN: Montey Hora Primary Care Provider: Glendon Axe Other Clinician: Referring Provider: Glendon Axe Treating  Provider/Extender: STONE III, HOYT Weeks in Treatment: 19 Total Contact Cast Applied for Wound Assessment: Wound #1R Left Toe Great Performed By: Physician Emilio Math., PA-C Post Procedure Diagnosis Same as Pre-procedure Electronic Signature(s) Signed: 04/25/2019 3:19:36 PM By: Montey Hora Signed: 04/26/2019 7:59:38 AM By: Worthy Keeler PA-C Entered By: Montey Hora on 04/25/2019 08:38:44 [name], [name] ([id number]) -------------------------------------------------------------------------------- SuperBill Details Patient Name: [name]. Date of Service: 04/25/2019 Medical Record Number: [id number] Patient Account Number: [id number] Date of Birth/Sex: 06-30-1946 (73 y.o. M) Treating RN: Montey Hora Primary Care Provider: Glendon Axe Other Clinician: Referring Provider: Glendon Axe Treating Provider/Extender: STONE III, HOYT Weeks in Treatment: 19 Diagnosis Coding ICD-10 Codes Code Description E11.621 Type 2 diabetes mellitus with foot ulcer L97.522 Non-pressure chronic ulcer of other part of left foot with fat layer exposed I10 Essential (primary) hypertension E11.43 Type 2 diabetes mellitus with diabetic autonomic (poly)neuropathy Facility Procedures CPT4 Code: 70623762 Description: 83151 - DEB SUBQ TISSUE 20 SQ CM/< ICD-10 Diagnosis Description L97.522 Non-pressure chronic ulcer of other part of left foot with fat Modifier: layer exposed Quantity: 1 Physician Procedures CPT4 Code: 7616073 Description: 11042 - WC PHYS SUBQ TISS 20 SQ CM ICD-10 Diagnosis Description L97.522 Non-pressure chronic ulcer of other part of left foot with fat Modifier: layer exposed Quantity: 1 Electronic Signature(s) Signed: 04/26/2019 7:59:38 AM By: Worthy Keeler PA-C Entered By: Worthy Keeler on 04/25/2019 11:21:35

## 2019-04-26 NOTE — Progress Notes (Signed)
[name], [name] ([id number]) Visit Report for 04/25/2019 Arrival Information Details Patient Name: [name], [name]. Date of Service: 04/25/2019 8:00 AM Medical Record Number: [id number] Patient Account Number: [id number] Date of Birth/Sex: 10/01/1946 (73 y.o. M) Treating RN: Harold Barban Primary Care Kendarrius Tanzi: Glendon Axe Other Clinician: Referring Lovell Roe: Glendon Axe Treating Dashton Czerwinski/Extender: Melburn Hake, HOYT Weeks in Treatment: 19 Visit Information History Since Last Visit Added or deleted any medications: No Patient Arrived: Cane Any new allergies or adverse reactions: No Arrival Time: 08:11 Had a fall or experienced change in No Accompanied By: self activities of daily living that may affect Transfer Assistance: None risk of falls: Patient Identification Verified: Yes Signs or symptoms of abuse/neglect since last visito No Secondary Verification Process Completed: Yes Hospitalized since last visit: No Patient Has Alerts: Yes Has Dressing in Place as Prescribed: Yes Patient Alerts: DMII Pain Present Now: No Electronic Signature(s) Signed: 04/26/2019 4:45:14 PM By: Harold Barban Entered By: Harold Barban on 04/25/2019 08:12:37 [name] ([id number]) -------------------------------------------------------------------------------- Encounter Discharge Information Details Patient Name: [name]. Date of Service: 04/25/2019 8:00 AM Medical Record Number: [id number] Patient Account Number: [id number] Date of Birth/Sex: 15-Oct-1946 (73 y.o. M) Treating RN: Montey Hora Primary Care Pryor Guettler: Glendon Axe Other Clinician: Referring Amrie Gurganus: Glendon Axe Treating Macarthur Lorusso/Extender: STONE III, HOYT Weeks in Treatment: 19 Encounter Discharge Information Items Post Procedure Vitals Discharge Condition: Stable Temperature (F): 97.7 Ambulatory Status: Ambulatory Pulse (bpm): 77 Discharge Destination: Home Respiratory Rate  (breaths/min): 16 Transportation: Private Auto Blood Pressure (mmHg): 171/80 Accompanied By: self Schedule Follow-up Appointment: Yes Clinical Summary of Care: Electronic Signature(s) Signed: 04/25/2019 3:19:36 PM By: Montey Hora Entered By: Montey Hora on 04/25/2019 08:39:18 [name], [name] -------------------------------------------------------------------------------- Lower Extremity Assessment Details Patient Name: [name]. Date of Service: 04/25/2019 8:00 AM Medical Record Number: [id number] Patient Account Number: [id number] Date of Birth/Sex: [birth year] (73 y.o. M) Treating RN: Harold Barban Primary Care Veniamin Kincaid: Glendon Axe Other Clinician: Referring Charlye Spare: Glendon Axe Treating Sanoe Hazan/Extender: STONE III, HOYT Weeks in Treatment: 19 Vascular Assessment Pulses: Dorsalis Pedis Palpable: [Left:Yes] Posterior Tibial Palpable: [Left:Yes] Electronic Signature(s) Signed: 04/26/2019 4:45:14 PM By: Harold Barban Entered By: Harold Barban on 04/25/2019 08:18:12 [name] ([id number]) -------------------------------------------------------------------------------- Multi Wound Chart Details Patient Name: [name]. Date of Service: 04/25/2019 8:00 AM Medical Record Number: [id number] Patient Account Number: [id number] Date of Birth/Sex: [birth year] (73 y.o. M) Treating RN: Montey Hora Primary Care Chen Saadeh: Glendon Axe Other Clinician: Referring Arlester Keehan: Glendon Axe Treating Mikailah Morel/Extender: STONE III, HOYT Weeks in Treatment: 19 Vital Signs Height(in): 72 Pulse(bpm): 77 Weight(lbs): 250 Blood Pressure(mmHg): 171/80 Body Mass Index(BMI): 34 Temperature(F): 97.7 Respiratory Rate 16 (breaths/min): Photos: [N/A:N/A] Wound Location: Left Toe Great N/A N/A Wounding Event: Gradually Appeared N/A N/A Primary Etiology: Diabetic Wound/Ulcer of the N/A N/A Lower Extremity Comorbid  History: Hypertension, Type II Diabetes N/A N/A Date Acquired: 11/30/2017 N/A N/A Weeks of Treatment: 19 N/A N/A Wound Status: Open N/A N/A Wound Recurrence: Yes N/A N/A Measurements L x W x D 0.3x0.4x0.2 N/A N/A (cm) Area (cm) : 0.094 N/A N/A Volume (cm) : 0.019 N/A N/A % Reduction in Area: 85.20% N/A N/A % Reduction in Volume: 70.30% N/A N/A Classification: Grade 2 N/A N/A Exudate Amount: Medium N/A N/A Exudate Type: Serosanguineous N/A N/A Exudate Color: red, brown N/A N/A Wound Margin: Thickened N/A N/A Granulation Amount: Large (67-100%) N/A N/A Granulation Quality: Pale N/A N/A Necrotic Amount: Small (1-33%) N/A N/A Exposed Structures: Fat Layer (Subcutaneous N/A N/A Tissue) Exposed: Yes Fascia: No Tendon:  No Muscle: No [name], [name] ([id number]) Joint: No Bone: No Epithelialization: Large (67-100%) N/A N/A Periwound Skin Texture: Callus: Yes N/A N/A Excoriation: No Induration: No Crepitus: No Rash: No Scarring: No Periwound Skin Moisture: Maceration: No N/A N/A Fred/Scaly: No Periwound Skin Color: Atrophie Blanche: No N/A N/A Cyanosis: No Ecchymosis: No Erythema: No Hemosiderin Staining: No Mottled: No Pallor: No Rubor: No Temperature: No [name] N/A N/A Tenderness on Palpation: Yes N/A N/A Treatment Notes Electronic Signature(s) Signed: 04/25/2019 3:19:36 PM By: Montey Hora Entered By: Montey Hora on 04/25/2019 08:33:47 [name], [name] ([id number]) -------------------------------------------------------------------------------- [place] Details Patient Name: [name]. Date of Service: 04/25/2019 8:00 AM Medical Record Number: [id number] Patient Account Number: [id number] Date of Birth/Sex: June 01, 1946 (73 y.o. M) Treating RN: Montey Hora Primary Care Rondey Fallen: Glendon Axe Other Clinician: Referring Ranita Stjulien: Glendon Axe Treating Nealy Hickmon/Extender: STONE III, HOYT Weeks in  Treatment: 62 Active Inactive Abuse / Safety / Falls / Self Care Management Nursing Diagnoses: Potential for falls Goals: Patient will remain injury free related to falls Date Initiated: 01/19/2019 Target Resolution Date: 07/01/2019 Goal Status: Active Interventions: Assess fall risk on admission and as needed Notes: Peripheral Neuropathy Nursing Diagnoses: Knowledge deficit related to disease process and management of peripheral neurovascular dysfunction Goals: Patient/caregiver will verbalize understanding of disease process and disease management Date Initiated: 03/17/2019 Target Resolution Date: 06/03/2019 Goal Status: Active Interventions: Assess signs and symptoms of neuropathy upon admission and as needed Provide education on Management of Neuropathy and Related Ulcers Notes: Wound/Skin Impairment Nursing Diagnoses: Impaired tissue integrity Goals: Ulcer/skin breakdown will have a volume reduction of 30% by week 4 Date Initiated: 12/12/2018 Target Resolution Date: 02/25/2019 Goal Status: Active Interventions: [name], [name] ([id number]) Assess patient/caregiver ability to obtain necessary supplies Assess patient/caregiver ability to perform ulcer/skin care regimen upon admission and as needed Notes: Electronic Signature(s) Signed: 04/25/2019 3:19:36 PM By: Montey Hora Entered By: Montey Hora on 04/25/2019 08:33:02 [name], [name] ([id number]) -------------------------------------------------------------------------------- Pain Assessment Details Patient Name: [name]. Date of Service: 04/25/2019 8:00 AM Medical Record Number: [id number] Patient Account Number: [id number] Date of Birth/Sex: 10/23/1946 (73 y.o. M) Treating RN: Harold Barban Primary Care Kang Ishida: Glendon Axe Other Clinician: Referring Alenah Sarria: Glendon Axe Treating Keeven Matty/Extender: STONE III, HOYT Weeks in Treatment: 19 Active Problems Location of Pain  Severity and Description of Pain Patient Has Paino No Site Locations Pain Management and Medication Current Pain Management: Electronic Signature(s) Signed: 04/26/2019 4:45:14 PM By: Harold Barban Entered By: Harold Barban on 04/25/2019 08:12:46 [name] ([id number]) -------------------------------------------------------------------------------- Patient/Caregiver Education Details Patient Name: [name]. Date of Service: 04/25/2019 8:00 AM Medical Record Number: [id number] Patient Account Number: [id number] Date of Birth/Gender: 10/28/1946 (73 y.o. M) Treating RN: Montey Hora Primary Care Physician: Glendon Axe Other Clinician: Referring Physician: Glendon Axe Treating Physician/Extender: Sharalyn Ink in Treatment: 89 Education Assessment Education Provided To: Patient Education Topics Provided Offloading: Handouts: Other: TCC precautions Methods: Explain/Verbal Responses: State content correctly Electronic Signature(s) Signed: 04/25/2019 3:19:36 PM By: Montey Hora Entered By: Montey Hora on 04/25/2019 08:41:09 [name], [name] ([id number]) -------------------------------------------------------------------------------- Wound Assessment Details Patient Name: [name]. Date of Service: 04/25/2019 8:00 AM Medical Record Number: [id number] Patient Account Number: [id number] Date of Birth/Sex: 01-Mar-1946 (73 y.o. M) Treating RN: Harold Barban Primary Care Ayson Cherubini: Glendon Axe Other Clinician: Referring Shakendra Griffeth: Glendon Axe Treating Kendrew Paci/Extender: STONE III, HOYT Weeks in Treatment: 19 Wound Status Wound Number: 1R Primary Etiology: Diabetic Wound/Ulcer of the Lower Extremity Wound Location: Left Toe Great Wound Status:  Open Wounding Event: Gradually Appeared Comorbid Hypertension, Type II Diabetes Date Acquired: 11/30/2017 History: Weeks Of Treatment: 19 Clustered Wound:  No Photos Wound Measurements Length: (cm) 0.3 % Reduction in Width: (cm) 0.4 % Reduction in Depth: (cm) 0.2 Epithelializati Area: (cm) 0.094 Tunneling: Volume: (cm) 0.019 Undermining: Area: 85.2% Volume: 70.3% on: Large (67-100%) No No Wound Description Classification: Grade 2 Foul Odor After Wound Margin: Thickened Slough/Fibrino Exudate Amount: Medium Exudate Type: Serosanguineous Exudate Color: red, brown Cleansing: No Yes Wound Bed Granulation Amount: Large (67-100%) Exposed Structure Granulation Quality: Pale Fascia Exposed: No Necrotic Amount: Small (1-33%) Fat Layer (Subcutaneous Tissue) Exposed: Yes Necrotic Quality: Adherent Slough Tendon Exposed: No Muscle Exposed: No Joint Exposed: No Bone Exposed: No Periwound Skin Texture Texture Color Eichholz, Reyansh L. ([id number]) No Abnormalities Noted: No No Abnormalities Noted: No Callus: Yes Atrophie Blanche: No Crepitus: No Cyanosis: No Excoriation: No Ecchymosis: No Induration: No Erythema: No Rash: No Hemosiderin Staining: No Scarring: No Mottled: No Pallor: No Moisture Rubor: No No Abnormalities Noted: No [name] Tenderness on Palpation: Yes Treatment Notes Wound #1R (Left Toe Great) Notes prisma, foam, TCC applied in clinic today Electronic Signature(s) Signed: 04/26/2019 4:45:14 PM By: Harold Barban Entered By: Harold Barban on 04/25/2019 08:17:00 Dunn Loring, [name] ([id number]) -------------------------------------------------------------------------------- Vitals Details Patient Name: [name]. Date of Service: 04/25/2019 8:00 AM Medical Record Number: [id number] Patient Account Number: [id number] Date of Birth/Sex: 16-Aug-1946 (73 y.o. M) Treating RN: Harold Barban Primary Care Sharmon Cheramie: Glendon Axe Other Clinician: Referring Gurney Balthazor: Glendon Axe Treating Jahzaria Vary/Extender:  STONE III, HOYT Weeks in Treatment: 19 Vital Signs Time Taken: 08:12 Temperature (F): 97.7 Height (in): 72 Pulse (bpm): 77 Weight (lbs): 250 Respiratory Rate (breaths/min): 16 Body Mass Index (BMI): 33.9 Blood Pressure (mmHg): 171/80 Reference Range: 80 - 120 mg / dl Electronic Signature(s) Signed: 04/26/2019 4:45:14 PM By: Harold Barban Entered By: Harold Barban on 04/25/2019 08:13:14

## 2019-05-02 ENCOUNTER — Other Ambulatory Visit: Payer: Self-pay

## 2019-05-02 ENCOUNTER — Encounter: Payer: Medicare Other | Attending: Physician Assistant | Admitting: Physician Assistant

## 2019-05-02 DIAGNOSIS — E1143 Type 2 diabetes mellitus with diabetic autonomic (poly)neuropathy: Secondary | ICD-10-CM | POA: Diagnosis not present

## 2019-05-02 DIAGNOSIS — E11621 Type 2 diabetes mellitus with foot ulcer: Secondary | ICD-10-CM | POA: Insufficient documentation

## 2019-05-02 DIAGNOSIS — I10 Essential (primary) hypertension: Secondary | ICD-10-CM | POA: Insufficient documentation

## 2019-05-02 DIAGNOSIS — L97529 Non-pressure chronic ulcer of other part of left foot with unspecified severity: Secondary | ICD-10-CM | POA: Diagnosis present

## 2019-05-02 DIAGNOSIS — Z8673 Personal history of transient ischemic attack (TIA), and cerebral infarction without residual deficits: Secondary | ICD-10-CM | POA: Insufficient documentation

## 2019-05-02 DIAGNOSIS — L97522 Non-pressure chronic ulcer of other part of left foot with fat layer exposed: Secondary | ICD-10-CM | POA: Diagnosis not present

## 2019-05-02 NOTE — Progress Notes (Signed)
WAEL, MAESTAS ([id number]) Visit Report for 05/02/2019 Arrival Information Details Patient Name: Fred, [name]. Date of Service: 05/02/2019 9:15 AM Medical Record Number: [id number] Patient Account Number: [id number] Date of Birth/Sex: 1946-12-04 (73 y.o. M) Treating RN: Cornell Barman Primary Care Yasiel Goyne: Glendon Axe Other Clinician: Referring Frenchie Dangerfield: Glendon Axe Treating Laikynn Pollio/Extender: Melburn Hake, HOYT Weeks in Treatment: 20 Visit Information History Since Last Visit Added or deleted any medications: Yes Patient Arrived: Cane Any new allergies or adverse reactions: No Arrival Time: 09:25 Had a fall or experienced change in No Accompanied By: self activities of daily living that may affect Transfer Assistance: None risk of falls: Patient Identification Verified: Yes Signs or symptoms of abuse/neglect since last visito No Secondary Verification Process Completed: Yes Hospitalized since last visit: No Patient Has Alerts: Yes Implantable device outside of the clinic excluding No Patient Alerts: DMII cellular tissue based products placed in the center since last visit: Has Dressing in Place as Prescribed: Yes Has Footwear/Offloading in Place as Prescribed: Yes Left: Total Contact Cast Pain Present Now: No Electronic Signature(s) Signed: 05/02/2019 1:36:45 PM By: Gretta Cool, BSN, RN, CWS, Kim RN, BSN Entered By: Gretta Cool, BSN, RN, CWS, Kim on 05/02/2019 09:26:13 [name] ([id number]) -------------------------------------------------------------------------------- Lower Extremity Assessment Details Patient Name: [name]. Date of Service: 05/02/2019 9:15 AM Medical Record Number: [id number] Patient Account Number: [id number] Date of Birth/Sex: [birth year] (73 y.o. M) Treating RN: Cornell Barman Primary Care Tyia Binford: Glendon Axe Other Clinician: Referring Ermal Brzozowski: Glendon Axe Treating Lian Tanori/Extender: Melburn Hake, HOYT Weeks in  Treatment: 20 Vascular Assessment Pulses: Dorsalis Pedis Doppler Audible: [Left:Yes] Electronic Signature(s) Signed: 05/02/2019 1:36:45 PM By: Gretta Cool, BSN, RN, CWS, Kim RN, BSN Entered By: Gretta Cool, BSN, RN, CWS, Kim on 05/02/2019 09:42:04 [name] ([id number]) -------------------------------------------------------------------------------- Multi Wound Chart Details Patient Name: [name] Date of Service: 05/02/2019 9:15 AM Medical Record Number: [id number] Patient Account Number: [id number] Date of Birth/Sex: 1946/12/06 (73 y.o. M) Treating RN: Montey Hora Primary Care Geraldina Parrott: Glendon Axe Other Clinician: Referring Lilas Diefendorf: Glendon Axe Treating Patrice Matthew/Extender: STONE III, HOYT Weeks in Treatment: 20 Vital Signs Height(in): 72 Pulse(bpm): 75 Weight(lbs): 250 Blood Pressure(mmHg): 158/75 Body Mass Index(BMI): 34 Temperature(F): 98 Respiratory Rate 16 (breaths/min): Photos: [N/A:N/A] Wound Location: Left Toe Great N/A N/A Wounding Event: Gradually Appeared N/A N/A Primary Etiology: Diabetic Wound/Ulcer of the N/A N/A Lower Extremity Comorbid History: Hypertension, Type II Diabetes N/A N/A Date Acquired: 11/30/2017 N/A N/A Weeks of Treatment: 20 N/A N/A Wound Status: Open N/A N/A Wound Recurrence: Yes N/A N/A Measurements L x W x D 0.3x0.3x0.2 N/A N/A (cm) Area (cm) : 0.071 N/A N/A Volume (cm) : 0.014 N/A N/A % Reduction in Area: 88.80% N/A N/A % Reduction in Volume: 78.10% N/A N/A Classification: Grade 2 N/A N/A Exudate Amount: Medium N/A N/A Exudate Type: Serosanguineous N/A N/A Exudate Color: red, brown N/A N/A Wound Margin: Thickened N/A N/A Granulation Amount: Large (67-100%) N/A N/A Granulation Quality: Pale, Hyper-granulation N/A N/A Necrotic Amount: Small (1-33%) N/A N/A Exposed Structures: Fat Layer (Subcutaneous N/A N/A Tissue) Exposed: Yes Fascia: No Tendon: No Muscle: No [name], [name]  ([id number]) Joint: No Bone: No Epithelialization: Large (67-100%) N/A N/A Periwound Skin Texture: Callus: Yes N/A N/A Excoriation: No Induration: No Crepitus: No Rash: No Scarring: No Periwound Skin Moisture: Maceration: No N/A N/A Dry/Scaly: No Periwound Skin Color: Atrophie Blanche: No N/A N/A Cyanosis: No Ecchymosis: No Erythema: No Hemosiderin Staining: No Mottled: No Pallor: No Rubor: No Temperature: No Abnormality N/A N/A Tenderness on Palpation: No N/A  N/A Treatment Notes Electronic Signature(s) Signed: 05/02/2019 1:35:27 PM By: Montey Hora Entered By: Montey Hora on 05/02/2019 10:06:01 [name] ([id number]) -------------------------------------------------------------------------------- Coleman Details Patient Name: [name] Date of Service: 05/02/2019 9:15 AM Medical Record Number: [id number] Patient Account Number: [id number] Date of Birth/Sex: [birth year] (73 y.o. M) Treating RN: Montey Hora Primary Care Kaiden Dardis: Glendon Axe Other Clinician: Referring Jadiel Schmieder: Glendon Axe Treating Jevaughn Degollado/Extender: STONE III, HOYT Weeks in Treatment: 20 Active Inactive Abuse / Safety / Falls / Self Care Management Nursing Diagnoses: Potential for falls Goals: Patient will remain injury free related to falls Date Initiated: 01/19/2019 Target Resolution Date: 07/01/2019 Goal Status: Active Interventions: Assess fall risk on admission and as needed Notes: Peripheral Neuropathy Nursing Diagnoses: Knowledge deficit related to disease process and management of peripheral neurovascular dysfunction Goals: Patient/caregiver will verbalize understanding of disease process and disease management Date Initiated: 03/17/2019 Target Resolution Date: 06/03/2019 Goal Status: Active Interventions: Assess signs and symptoms of neuropathy upon admission and as needed Provide education on Management of Neuropathy and Related  Ulcers Notes: Wound/Skin Impairment Nursing Diagnoses: Impaired tissue integrity Goals: Ulcer/skin breakdown will have a volume reduction of 30% by week 4 Date Initiated: 12/12/2018 Target Resolution Date: 02/25/2019 Goal Status: Active Interventions: [name], [name] ([id number]) Assess patient/caregiver ability to obtain necessary supplies Assess patient/caregiver ability to perform ulcer/skin care regimen upon admission and as needed Notes: Electronic Signature(s) Signed: 05/02/2019 1:35:27 PM By: Montey Hora Entered By: Montey Hora on 05/02/2019 10:05:55 [name] ([id number]) -------------------------------------------------------------------------------- Pain Assessment Details Patient Name: [name]. Date of Service: 05/02/2019 9:15 AM Medical Record Number: [id number] Patient Account Number: [id number] Date of Birth/Sex: Jan 03, 1946 (73 y.o. M) Treating RN: Cornell Barman Primary Care Vesper Trant: Glendon Axe Other Clinician: Referring Phuong Hillary: Glendon Axe Treating Ciana Simmon/Extender: STONE III, HOYT Weeks in Treatment: 20 Active Problems Location of Pain Severity and Description of Pain Patient Has Paino No Site Locations Pain Management and Medication Current Pain Management: Notes Patient denies pain at this time Electronic Signature(s) Signed: 05/02/2019 1:36:45 PM By: Gretta Cool, BSN, RN, CWS, Kim RN, BSN Entered By: Gretta Cool, BSN, RN, CWS, Kim on 05/02/2019 09:26:34 [name] ([id number]) -------------------------------------------------------------------------------- Patient/Caregiver Education Details Patient Name: [name] Date of Service: 05/02/2019 9:15 AM Medical Record Number: [id number] Patient Account Number: [id number] Date of Birth/Gender: 1946/10/30 (74 y.o. M) Treating RN: Montey Hora Primary Care Physician: Glendon Axe Other Clinician: Referring Physician: Glendon Axe Treating  Physician/Extender: Sharalyn Ink in Treatment: 20 Education Assessment Education Provided To: Patient Education Topics Provided Safety: Handouts: Other: fall precautions Methods: Explain/Verbal Responses: State content correctly Electronic Signature(s) Signed: 05/02/2019 1:35:27 PM By: Montey Hora Entered By: Montey Hora on 05/02/2019 10:09:04 [name] ([id number]) -------------------------------------------------------------------------------- Wound Assessment Details Patient Name: [name]. Date of Service: 05/02/2019 9:15 AM Medical Record Number: [id number] Patient Account Number: [id number] Date of Birth/Sex: [birth year] (73 y.o. M) Treating RN: Cornell Barman Primary Care Karsten Vaughn: Glendon Axe Other Clinician: Referring Seema Blum: Glendon Axe Treating Cheskel Silverio/Extender: STONE III, HOYT Weeks in Treatment: 20 Wound Status Wound Number: [id number] Primary Etiology: Diabetic Wound/Ulcer of the Lower Extremity Wound Location: Left Toe Great Wound Status: Open Wounding Event: Gradually Appeared Comorbid Hypertension, Type II Diabetes Date Acquired: 11/30/2017 History: Weeks Of Treatment: 20 Clustered Wound: No Photos Wound Measurements Length: (cm) 0.3 % Reduction Width: (cm) 0.3 % Reduction Depth: (cm) 0.2 Epithelializ Area: (cm) 0.071 Tunneling: Volume: (cm) 0.014 Undermining in Area: 88.8% in Volume: 78.1% ation: Large (67-100%) No : No Wound Description  Classification: Grade 2 Foul Odor Af Wound Margin: Thickened Slough/Fibri Exudate Amount: Medium Exudate Type: Serosanguineous Exudate Color: red, brown ter Cleansing: No no Yes Wound Bed Granulation Amount: Large (67-100%) Exposed Structure Granulation Quality: Pale, Hyper-granulation Fascia Exposed: No Necrotic Amount: Small (1-33%) Fat Layer (Subcutaneous Tissue) Exposed: Yes Necrotic Quality: Adherent Slough Tendon Exposed: No Muscle Exposed: No Joint Exposed:  No Bone Exposed: No Periwound Skin Texture Texture Color [name], Fred L. ([id number]) No Abnormalities Noted: No No Abnormalities Noted: No Callus: Yes Atrophie Blanche: No Crepitus: No Cyanosis: No Excoriation: No Ecchymosis: No Induration: No Erythema: No Rash: No Hemosiderin Staining: No Scarring: No Mottled: No Pallor: No Moisture Rubor: No No Abnormalities Noted: No Dry / Scaly: No Temperature / Pain Maceration: No Temperature: No Abnormality Treatment Notes Wound #1R (Left Toe Great) Notes prisma, foam, TCC applied in clinic today Electronic Signature(s) Signed: 05/02/2019 1:36:45 PM By: Gretta Cool, BSN, RN, CWS, Kim RN, BSN Entered By: Gretta Cool, BSN, RN, CWS, Kim on 05/02/2019 [name], [name] ([id number]) -------------------------------------------------------------------------------- Vitals Details Patient Name: [name]. Date of Service: 05/02/2019 9:15 AM Medical Record Number: [id number] Patient Account Number: [id number] Date of Birth/Sex: 1946/09/16 (73 y.o. M) Treating RN: Cornell Barman Primary Care Kacee Koren: Glendon Axe Other Clinician: Referring Willia Genrich: Glendon Axe Treating Eowyn Tabone/Extender: STONE III, HOYT Weeks in Treatment: 20 Vital Signs Time Taken: 09:27 Temperature (F): 98 Height (in): 72 Pulse (bpm): 75 Weight (lbs): 250 Respiratory Rate (breaths/min): 16 Body Mass Index (BMI): 33.9 Blood Pressure (mmHg): 158/75 Reference Range: 80 - 120 mg / dl Electronic Signature(s) Signed: 05/02/2019 1:36:45 PM By: Gretta Cool, BSN, RN, CWS, Kim RN, BSN Entered By: Gretta Cool, BSN, RN, CWS, Kim on 05/02/2019 09:37:52

## 2019-05-04 NOTE — Progress Notes (Signed)
DAVONN, FLANERY ([id number]) Visit Report for 05/02/2019 Chief Complaint Document Details Patient Name: [name], [name]. Date of Service: 05/02/2019 9:15 AM Medical Record Number: [id number] Patient Account Number: [id number] Date of Birth/Sex: 1946/09/30 (73 y.o. M) Treating RN: Montey Hora Primary Care Provider: Glendon Axe Other Clinician: Referring Provider: Glendon Axe Treating Provider/Extender: STONE III, HOYT Weeks in Treatment: 20 Information Obtained from: Patient Chief Complaint Left great toe ulcer Electronic Signature(s) Signed: 05/02/2019 3:19:36 PM By: Worthy Keeler PA-C Entered By: Worthy Keeler on 05/02/2019 09:43:11 [place], [name] ([id number]) -------------------------------------------------------------------------------- Debridement Details Patient Name: [name]. Date of Service: 05/02/2019 9:15 AM Medical Record Number: [id number] Patient Account Number: [id number] Date of Birth/Sex: [birth year] (73 y.o. M) Treating RN: Montey Hora Primary Care Provider: Glendon Axe Other Clinician: Referring Provider: Glendon Axe Treating Provider/Extender: STONE III, HOYT Weeks in Treatment: 20 Debridement Performed for Wound #1R Left Toe Great Assessment: Performed By: Physician STONE III, HOYT E., PA-C Debridement Type: Debridement Severity of Tissue Pre Fat layer exposed Debridement: Level of Consciousness (Pre- Awake and Alert procedure): Pre-procedure Verification/Time Yes - 10:05 Out Taken: Start Time: 10:05 Pain Control: Lidocaine 4% Topical Solution Total Area Debrided (L x W): 0.3 (cm) x 0.3 (cm) = 0.09 (cm) Tissue and other material Viable, Non-Viable, Callus, Slough, Subcutaneous, Slough debrided: Level: Skin/Subcutaneous Tissue Debridement Description: Excisional Instrument: Curette Bleeding: Minimum Hemostasis Achieved: Pressure End Time: 10:08 Procedural Pain: 0 Post Procedural Pain:  0 Response to Treatment: Procedure was tolerated well Level of Consciousness Awake and Alert (Post-procedure): Post Debridement Measurements of Total Wound Length: (cm) 0.3 Width: (cm) 0.3 Depth: (cm) 0.3 Volume: (cm) 0.021 Character of Wound/Ulcer Post Debridement: Improved Severity of Tissue Post Debridement: Fat layer exposed Post Procedure Diagnosis Same as Pre-procedure Electronic Signature(s) Signed: 05/02/2019 1:35:27 PM By: Montey Hora Signed: 05/02/2019 3:19:36 PM By: Worthy Keeler PA-C Entered By: Montey Hora on 05/02/2019 10:08:32 [name] ([id number]) -------------------------------------------------------------------------------- HPI Details Patient Name: [name] Date of Service: 05/02/2019 9:15 AM Medical Record Number: [id number] Patient Account Number: [id number] Date of Birth/Sex: 05-Apr-1946 (73 y.o. M) Treating RN: Montey Hora Primary Care Provider: Glendon Axe Other Clinician: Referring Provider: Glendon Axe Treating Provider/Extender: Melburn Hake, HOYT Weeks in Treatment: 20 History of Present Illness HPI Description: 12/12/18 on evaluation today patient presents as a referral from his endocrinologist regarding issues that he has been having with his left great toe. Subsequently he has been seeing Dr. Cleda Mccreedy and Dr. Cleda Mccreedy has been the breeding the wound and in fact this was debrided this morning. The patient had an appointment there prior to coming here. Subsequently he states that even though he's been going for regular debridement after office things really do not seem to be showing signs of improvement unfortunately. He states that he is having some discomfort but nothing too significant. No fevers, chills, nausea, or vomiting noted at this time. The patient does have a history of hypertension, neuropathy, diabetes mellitus type II, and a history of stroke. Currently he's been using bacitracin on the toe and utilizing a  Band-Aid. He tells me he's had this wound for two years. He cannot remember the last time he had an x-ray. 12/19/18 on evaluation today patient appears to be doing well in regard to his plantar toe ulcer. He's been tolerating the dressing changes without complication. Fortunately there does not appear to be signs of infection at this time which is good news. No fever chills noted. 01/19/19 has been one month since I last  saw the patient as he was out of town. With that being said on evaluation today it does appear that he is going to require sharp debridement and I do believe that the total contact cast would benefit him as far as being applied at this time. He is in agreement that plan. 01/23/19 on evaluation today patient appears to be doing better in regard to his left great toe ulcer. He has been shown signs of improvement week by week and although this is slow it does seem to be doing fairly well which is good news. Overall very pleased in this regard. 01/31/19 on evaluation today patient presents for follow-up concerning his great toe ulcer. He was actually supposed to be here yesterday and he did come however he ended up having to leave due to his blood sugar dropping he states he just did not eat enough lunch. Fortunately seems to be doing much better today which is excellent news. Fortunately there's no evidence of infection at this time which is also good news. No fevers, chills, nausea, or vomiting noted at this time. Overall I feel like he is making excellent progress. 02/06/19 on evaluation today patient actually appears to be doing very well in regard to his plantar toe ulcer. He's been tolerating the dressing changes without complication. Fortunately there is no sign of infection at this time. Overall been very pleased with how things seem to be progressing. No fevers, chills, nausea, or vomiting noted at this time. 02/13/19 on evaluation today patient actually appears to be doing  excellent in regard to his toe ulcer which is almost completely close. Overall very pleased with that. There does not appear to be any signs of infection which is good news. Upon close inspection it appears that he has just a very slight opening still noted at the central portion of the toe although this is minimal. 02/16/19 on evaluation today patient is seen for early follow-up due to the fact that he tells me while at home he actually got up to go answer the door without putting on the walking boot part of his cast and subsequently damaged the total contact cast. It therefore comes in to have this removed and potentially replaced. Fortunately other than it given him a little bit of pain its far as pushing in on some areas there doesn't appear to be any injury once the cast was removed. 03/13/19 on evaluation today patient unfortunately presents for follow-up concerning his great toe ulcer on the left. He was discharged on 02/16/19 with the wound healed at that point. He tells me this remain so for several weeks or least a couple weeks before reopening. Fortunately there does not appear to be any signs of infection at this time. Nonetheless for the past week at least he's had the wound reopened and states it is been trying to keep pressure off of this but he has not been using the offloading shoe we previously gave him. Fortunately there's no evidence of infection at this time. No fevers, chills, nausea, or vomiting noted at this time. WELFORD, CHRISTMAS ([id number]) 03/17/19 on evaluation today patient actually appears to be doing rather well in regard to his toe also. Fact this appears to be significantly smaller this week even compared to what I saw last week on evaluation. He seems to have done very well for the offloading shoe and overall I'm very pleased with the progress that is made. It may be that he doesn't even need the cast to  get this to heal if he offloads this  appropriately. 03/24/19 on evaluation today patient actually appears to be doing well in regard to his plantar foot ulcer. He's been tolerating the dressing changes without complication the collagen does seem to be beneficial for him. With that being said he is gonna require some miles sharp debridement today to clear away some of the callous's around the edge of the wound as well as the minimal Slough noted on the surface of the wound. 03/31/19 on evaluation today patient appears to be doing well in regard to his plantar great toe ulcer. He has been tolerating the dressing changes without complication which is good news. Fortunately there does not appear to be any signs of active infection at this time. Overall very pleased with how things seem to be progressing. 04/11/19 on evaluation today patient's tools are appears to be doing in my pinion roughly about the same as were things that previous. Fortunately there does not appear to be signs of active infection at this time which is good news. With that being said he has not been experiencing any pain at this time which is good news there is also No fevers, chills, nausea, or vomiting noted at this time. 04/18/19 on evaluation today patient's wound actually appears to be doing fairly well today he did not even really have a lot of callous buildup. We have been debating on whether or not to reinitiate total contact cast. With that being said he seems to be doing fairly well this point with offloading in my pinion and again I think we need to come up with a way to get this healed that will also be sustainable for keeping it close. Again we were able to close it with the cast previous but again he has to be able to keep it such. For that reason we are working on trying to get him diabetic shoes he's waiting for the [place] to get in touch with the local company that has to measure him for these do not want to have a cast on at such a time as when they need  to actually measure him. 04/25/19 on evaluation today patient appears to be doing well in regard to his plantar foot specifically great toe ulcer. He's been tolerating the dressing changes without complication. Fortunately there's no signs of active infection at this time. Overall I'm very pleased with how things appear currently. 05/02/19 on evaluation today patient appears to actually be doing better in regard to his plantar foot ulcer. He shown signs of good improvement which is excellent news. She states he can walk better with the cast on the can with the offloading shoe that he had on previous. Fortunately there's no signs of active infection at this time. No fevers, chills, nausea, or vomiting noted at this time. Electronic Signature(s) Signed: 05/02/2019 3:19:36 PM By: Worthy Keeler PA-C Entered By: Worthy Keeler on 05/02/2019 15:07:47 [place], [name] ([id number]) -------------------------------------------------------------------------------- Physical Exam Details Patient Name: [name] Date of Service: 05/02/2019 9:15 AM Medical Record Number: [id number] Patient Account Number: [id number] Date of Birth/Sex: [birth year] (73 y.o. M) Treating RN: Montey Hora Primary Care Provider: Glendon Axe Other Clinician: Referring Provider: Glendon Axe Treating Provider/Extender: STONE III, HOYT Weeks in Treatment: 106 Constitutional Well-nourished and well-hydrated in no acute distress. Respiratory normal breathing without difficulty. Psychiatric this patient is able to make decisions and demonstrates good insight into disease process. Alert and Oriented x 3. pleasant and cooperative. Notes I  did perform sharp debridement today to clear way callous and some Slough from the surface the wound he tolerated this without complication post debridement wound bed appears to be doing much better which is excellent news. Overall very pleased in this regard. Electronic  Signature(s) Signed: 05/02/2019 3:19:36 PM By: Worthy Keeler PA-C Entered By: Worthy Keeler on 05/02/2019 15:08:37 [place], [name] ([id number]) -------------------------------------------------------------------------------- Physician Orders Details Patient Name: [name] Date of Service: 05/02/2019 9:15 AM Medical Record Number: [id number] Patient Account Number: [id number] Date of Birth/Sex: 08-Jun-1946 (73 y.o. M) Treating RN: Montey Hora Primary Care Provider: Glendon Axe Other Clinician: Referring Provider: Glendon Axe Treating Provider/Extender: STONE III, HOYT Weeks in Treatment: 20 Verbal / Phone Orders: No Diagnosis Coding ICD-10 Coding Code Description E11.621 Type 2 diabetes mellitus with foot ulcer L97.522 Non-pressure chronic ulcer of other part of left foot with fat layer exposed I10 Essential (primary) hypertension E11.43 Type 2 diabetes mellitus with diabetic autonomic (poly)neuropathy Wound Cleansing Wound #1R Left Toe Great o Clean wound with Normal Saline. o May Shower, gently pat wound dry prior to applying new dressing. Primary Wound Dressing Wound #1R Left Toe Great o Silver Collagen Secondary Dressing Wound #1R Left Toe Great o Foam Dressing Change Frequency Wound #1R Left Toe Great o Change dressing every week Follow-up Appointments Wound #1R Left Toe Great o Return Appointment in 1 week. Off-Loading Wound #1R Left Toe Great o Total Contact Cast to Left Lower Extremity Electronic Signature(s) Signed: 05/02/2019 1:35:27 PM By: Montey Hora Signed: 05/02/2019 3:19:36 PM By: Worthy Keeler PA-C Entered By: Montey Hora on 05/02/2019 10:06:38 [name] ([id number]) -------------------------------------------------------------------------------- Problem List Details Patient Name: [name], GANCARZ. Date of Service: 05/02/2019 9:15 AM Medical Record Number: [id number] Patient Account  Number: [id number] Date of Birth/Sex: 1946-07-20 (73 y.o. M) Treating RN: Montey Hora Primary Care Provider: Glendon Axe Other Clinician: Referring Provider: Glendon Axe Treating Provider/Extender: Melburn Hake, HOYT Weeks in Treatment: 20 Active Problems ICD-10 Evaluated Encounter Code Description Active Date Today Diagnosis E11.621 Type 2 diabetes mellitus with foot ulcer 12/12/2018 No Yes L97.522 Non-pressure chronic ulcer of other part of left foot with fat 12/12/2018 No Yes layer exposed I10 Essential (primary) hypertension 12/12/2018 No Yes E11.43 Type 2 diabetes mellitus with diabetic autonomic (poly) 12/12/2018 No Yes neuropathy Inactive Problems Resolved Problems Electronic Signature(s) Signed: 05/02/2019 3:19:36 PM By: Worthy Keeler PA-C Entered By: Worthy Keeler on 05/02/2019 09:43:05 [place], [name] ([id number]) -------------------------------------------------------------------------------- Progress Note Details Patient Name: [name] Date of Service: 05/02/2019 9:15 AM Medical Record Number: [id number] Patient Account Number: [id number] Date of Birth/Sex: 1946/11/12 (73 y.o. M) Treating RN: Montey Hora Primary Care Provider: Glendon Axe Other Clinician: Referring Provider: Glendon Axe Treating Provider/Extender: Melburn Hake, HOYT Weeks in Treatment: 20 Subjective Chief Complaint Information obtained from Patient Left great toe ulcer History of Present Illness (HPI) 12/12/18 on evaluation today patient presents as a referral from his endocrinologist regarding issues that he has been having with his left great toe. Subsequently he has been seeing Dr. Cleda Mccreedy and Dr. Cleda Mccreedy has been the breeding the wound and in fact this was debrided this morning. The patient had an appointment there prior to coming here. Subsequently he states that even though he's been going for regular debridement after office things really do not seem to be  showing signs of improvement unfortunately. He states that he is having some discomfort but nothing too significant. No fevers, chills, nausea, or vomiting noted at this time. The patient  does have a history of hypertension, neuropathy, diabetes mellitus type II, and a history of stroke. Currently he's been using bacitracin on the toe and utilizing a Band-Aid. He tells me he's had this wound for two years. He cannot remember the last time he had an x-ray. 12/19/18 on evaluation today patient appears to be doing well in regard to his plantar toe ulcer. He's been tolerating the dressing changes without complication. Fortunately there does not appear to be signs of infection at this time which is good news. No fever chills noted. 01/19/19 has been one month since I last saw the patient as he was out of town. With that being said on evaluation today it does appear that he is going to require sharp debridement and I do believe that the total contact cast would benefit him as far as being applied at this time. He is in agreement that plan. 01/23/19 on evaluation today patient appears to be doing better in regard to his left great toe ulcer. He has been shown signs of improvement week by week and although this is slow it does seem to be doing fairly well which is good news. Overall very pleased in this regard. 01/31/19 on evaluation today patient presents for follow-up concerning his great toe ulcer. He was actually supposed to be here yesterday and he did come however he ended up having to leave due to his blood sugar dropping he states he just did not eat enough lunch. Fortunately seems to be doing much better today which is excellent news. Fortunately there's no evidence of infection at this time which is also good news. No fevers, chills, nausea, or vomiting noted at this time. Overall I feel like he is making excellent progress. 02/06/19 on evaluation today patient actually appears to be doing very well  in regard to his plantar toe ulcer. He's been tolerating the dressing changes without complication. Fortunately there is no sign of infection at this time. Overall been very pleased with how things seem to be progressing. No fevers, chills, nausea, or vomiting noted at this time. 02/13/19 on evaluation today patient actually appears to be doing excellent in regard to his toe ulcer which is almost completely close. Overall very pleased with that. There does not appear to be any signs of infection which is good news. Upon close inspection it appears that he has just a very slight opening still noted at the central portion of the toe although this is minimal. 02/16/19 on evaluation today patient is seen for early follow-up due to the fact that he tells me while at home he actually got up to go answer the door without putting on the walking boot part of his cast and subsequently damaged the total contact cast. It therefore comes in to have this removed and potentially replaced. Fortunately other than it given him a little bit of pain its far as pushing in on some areas there doesn't appear to be any injury once the cast was removed. LOCHLIN, EPPINGER ([id number]) 03/13/19 on evaluation today patient unfortunately presents for follow-up concerning his great toe ulcer on the left. He was discharged on 02/16/19 with the wound healed at that point. He tells me this remain so for several weeks or least a couple weeks before reopening. Fortunately there does not appear to be any signs of infection at this time. Nonetheless for the past week at least he's had the wound reopened and states it is been trying to keep pressure off of this  but he has not been using the offloading shoe we previously gave him. Fortunately there's no evidence of infection at this time. No fevers, chills, nausea, or vomiting noted at this time. 03/17/19 on evaluation today patient actually appears to be doing rather well in regard to  his toe also. Fact this appears to be significantly smaller this week even compared to what I saw last week on evaluation. He seems to have done very well for the offloading shoe and overall I'm very pleased with the progress that is made. It may be that he doesn't even need the cast to get this to heal if he offloads this appropriately. 03/24/19 on evaluation today patient actually appears to be doing well in regard to his plantar foot ulcer. He's been tolerating the dressing changes without complication the collagen does seem to be beneficial for him. With that being said he is gonna require some miles sharp debridement today to clear away some of the callous's around the edge of the wound as well as the minimal Slough noted on the surface of the wound. 03/31/19 on evaluation today patient appears to be doing well in regard to his plantar great toe ulcer. He has been tolerating the dressing changes without complication which is good news. Fortunately there does not appear to be any signs of active infection at this time. Overall very pleased with how things seem to be progressing. 04/11/19 on evaluation today patient's tools are appears to be doing in my pinion roughly about the same as were things that previous. Fortunately there does not appear to be signs of active infection at this time which is good news. With that being said he has not been experiencing any pain at this time which is good news there is also No fevers, chills, nausea, or vomiting noted at this time. 04/18/19 on evaluation today patient's wound actually appears to be doing fairly well today he did not even really have a lot of callous buildup. We have been debating on whether or not to reinitiate total contact cast. With that being said he seems to be doing fairly well this point with offloading in my pinion and again I think we need to come up with a way to get this healed that will also be sustainable for keeping it close. Again  we were able to close it with the cast previous but again he has to be able to keep it such. For that reason we are working on trying to get him diabetic shoes he's waiting for the [place] to get in touch with the local company that has to measure him for these do not want to have a cast on at such a time as when they need to actually measure him. 04/25/19 on evaluation today patient appears to be doing well in regard to his plantar foot specifically great toe ulcer. He's been tolerating the dressing changes without complication. Fortunately there's no signs of active infection at this time. Overall I'm very pleased with how things appear currently. 05/02/19 on evaluation today patient appears to actually be doing better in regard to his plantar foot ulcer. He shown signs of good improvement which is excellent news. She states he can walk better with the cast on the can with the offloading shoe that he had on previous. Fortunately there's no signs of active infection at this time. No fevers, chills, nausea, or vomiting noted at this time. Patient History Information obtained from Patient. Family History Diabetes - Mother, Heart Disease -  Father, Stroke - Father, No family history of Cancer, Hereditary Spherocytosis, Hypertension, Kidney Disease, Lung Disease, Seizures, Thyroid Problems, Tuberculosis. Social History Never smoker, Marital Status - Widowed, Alcohol Use - Never, Drug Use - No History, Caffeine Use - Moderate. Medical History Eyes Denies history of Cataracts, Glaucoma, Optic Neuritis Ear/Nose/Mouth/Throat Denies history of Chronic sinus problems/congestion, Middle ear problems ROLAND, LIPKE ([id number]) Hematologic/Lymphatic Denies history of Anemia, Hemophilia, Human Immunodeficiency Virus, Lymphedema Respiratory Denies history of Aspiration, Asthma, Chronic Obstructive Pulmonary Disease (COPD), Pneumothorax, Sleep Apnea, Tuberculosis Cardiovascular Patient has history of  Hypertension Denies history of Angina, Arrhythmia, Congestive Heart Failure, Coronary Artery Disease, Deep Vein Thrombosis, Hypotension, Myocardial Infarction, Peripheral Arterial Disease, Peripheral Venous Disease, Phlebitis, Vasculitis Gastrointestinal Denies history of Cirrhosis , Colitis, Crohn s, Hepatitis A, Hepatitis B, Hepatitis C Endocrine Patient has history of Type II Diabetes Denies history of Type I Diabetes Genitourinary Denies history of End Stage Renal Disease Immunological Denies history of Lupus Erythematosus, Raynaud s, Scleroderma Integumentary (Skin) Denies history of History of Burn, History of pressure wounds Musculoskeletal Denies history of Gout, Rheumatoid Arthritis, Osteoarthritis, Osteomyelitis Neurologic Denies history of Dementia, Neuropathy, Quadriplegia, Paraplegia, Seizure Disorder Oncologic Denies history of Received Chemotherapy, Received Radiation Review of Systems (ROS) Constitutional Symptoms (General Health) Denies complaints or symptoms of Fatigue, Fever, Chills, Marked Weight Change. Respiratory Denies complaints or symptoms of Chronic or frequent coughs, Shortness of Breath. Cardiovascular Denies complaints or symptoms of Chest pain, LE edema. Psychiatric Denies complaints or symptoms of Anxiety, Claustrophobia. Objective Constitutional Well-nourished and well-hydrated in no acute distress. Vitals Time Taken: 9:27 AM, Height: 72 in, Weight: 250 lbs, BMI: 33.9, Temperature: 98 F, Pulse: 75 bpm, Respiratory Rate: 16 breaths/min, Blood Pressure: 158/75 mmHg. Respiratory normal breathing without difficulty. Psychiatric this patient is able to make decisions and demonstrates good insight into disease process. Alert and Oriented x 3. pleasant DONNIS, PHANEUF. ([id number]) and cooperative. General Notes: I did perform sharp debridement today to clear way callous and some Slough from the surface the wound he tolerated this without  complication post debridement wound bed appears to be doing much better which is excellent news. Overall very pleased in this regard. Integumentary (Hair, Skin) Wound #1R status is Open. Original cause of wound was Gradually Appeared. The wound is located on the Left Toe Great. The wound measures 0.3cm length x 0.3cm width x 0.2cm depth; 0.071cm^2 area and 0.014cm^3 volume. There is Fat Layer (Subcutaneous Tissue) Exposed exposed. There is no tunneling or undermining noted. There is a medium amount of serosanguineous drainage noted. The wound margin is thickened. There is large (67-100%) pale, hyper - granulation within the wound bed. There is a small (1-33%) amount of necrotic tissue within the wound bed including Adherent Slough. The periwound skin appearance exhibited: Callus. The periwound skin appearance did not exhibit: Crepitus, Excoriation, Induration, Rash, Scarring, Dry/Scaly, Maceration, Atrophie Blanche, Cyanosis, Ecchymosis, Hemosiderin Staining, Mottled, Pallor, Rubor, Erythema. Periwound temperature was noted as No Abnormality. Assessment Active Problems ICD-10 Type 2 diabetes mellitus with foot ulcer Non-pressure chronic ulcer of other part of left foot with fat layer exposed Essential (primary) hypertension Type 2 diabetes mellitus with diabetic autonomic (poly)neuropathy Procedures Wound #1R Pre-procedure diagnosis of Wound #1R is a Diabetic Wound/Ulcer of the Lower Extremity located on the Left Toe Great .Severity of Tissue Pre Debridement is: Fat layer exposed. There was a Excisional Skin/Subcutaneous Tissue Debridement with a total area of 0.09 sq cm performed by STONE III, HOYT E., PA-C. With the following instrument(s): Curette to remove  Viable and Non-Viable tissue/material. Material removed includes Callus, Subcutaneous Tissue, and Slough after achieving pain control using Lidocaine 4% Topical Solution. No specimens were taken. A time out was conducted at 10:05,  prior to the start of the procedure. A Minimum amount of bleeding was controlled with Pressure. The procedure was tolerated well with a pain level of 0 throughout and a pain level of 0 following the procedure. Post Debridement Measurements: 0.3cm length x 0.3cm width x 0.3cm depth; 0.021cm^3 volume. Character of Wound/Ulcer Post Debridement is improved. Severity of Tissue Post Debridement is: Fat layer exposed. Post procedure Diagnosis Wound #1R: Same as Pre-Procedure Pre-procedure diagnosis of Wound #1R is a Diabetic Wound/Ulcer of the Lower Extremity located on the Left Toe Great . There was a Total Contact Cast Procedure by STONE III, HOYT E., PA-C. Post procedure Diagnosis Wound #1R: Same as Pre-Procedure JACINTO, KEIL ([id number]) Plan Wound Cleansing: Wound #1R Left Toe Great: Clean wound with Normal Saline. May Shower, gently pat wound dry prior to applying new dressing. Primary Wound Dressing: Wound #1R Left Toe Great: Silver Collagen Secondary Dressing: Wound #1R Left Toe Great: Foam Dressing Change Frequency: Wound #1R Left Toe Great: Change dressing every week Follow-up Appointments: Wound #1R Left Toe Great: Return Appointment in 1 week. Off-Loading: Wound #1R Left Toe Great: Total Contact Cast to Left Lower Extremity I'm gonna suggest that we continue with the above wound care measures for the next week and the patient is in agreement with plan. I did reapply the total contact cast today and he tolerated this without complication. We will subsequently see were things stand at follow-up. Please see above for specific wound care orders. We will see patient for re-evaluation in 1 week(s) here in the clinic. If anything worsens or changes patient will contact our office for additional recommendations. Electronic Signature(s) Signed: 05/02/2019 3:19:36 PM By: Worthy Keeler PA-C Entered By: Worthy Keeler on 05/02/2019 15:09:02 [name]  ([id number]) -------------------------------------------------------------------------------- ROS/PFSH Details Patient Name: [name] Date of Service: 05/02/2019 9:15 AM Medical Record Number: [id number] Patient Account Number: [id number] Date of Birth/Sex: 01/09/1946 (73 y.o. M) Treating RN: Montey Hora Primary Care Provider: Glendon Axe Other Clinician: Referring Provider: Glendon Axe Treating Provider/Extender: STONE III, HOYT Weeks in Treatment: 20 Information Obtained From Patient Constitutional Symptoms (General Health) Complaints and Symptoms: Negative for: Fatigue; Fever; Chills; Marked Weight Change Respiratory Complaints and Symptoms: Negative for: Chronic or frequent coughs; Shortness of Breath Medical History: Negative for: Aspiration; Asthma; Chronic Obstructive Pulmonary Disease (COPD); Pneumothorax; Sleep Apnea; Tuberculosis Cardiovascular Complaints and Symptoms: Negative for: Chest pain; LE edema Medical History: Positive for: Hypertension Negative for: Angina; Arrhythmia; Congestive Heart Failure; Coronary Artery Disease; Deep Vein Thrombosis; Hypotension; Myocardial Infarction; Peripheral Arterial Disease; Peripheral Venous Disease; Phlebitis; Vasculitis Psychiatric Complaints and Symptoms: Negative for: Anxiety; Claustrophobia Eyes Medical History: Negative for: Cataracts; Glaucoma; Optic Neuritis Ear/Nose/Mouth/Throat Medical History: Negative for: Chronic sinus problems/congestion; Middle ear problems Hematologic/Lymphatic Medical History: Negative for: Anemia; Hemophilia; Human Immunodeficiency Virus; Lymphedema Gastrointestinal ELYE, HARMSEN ([id number]) Medical History: Negative for: Cirrhosis ; Colitis; Crohnos; Hepatitis A; Hepatitis B; Hepatitis C Endocrine Medical History: Positive for: Type II Diabetes Negative for: Type I Diabetes Time with diabetes: 1990 Treated with: Insulin Blood sugar tested every day:  Yes Tested : Genitourinary Medical History: Negative for: End Stage Renal Disease Immunological Medical History: Negative for: Lupus Erythematosus; Raynaudos; Scleroderma Integumentary (Skin) Medical History: Negative for: History of Burn; History of pressure wounds Musculoskeletal Medical History: Negative for: Gout; Rheumatoid  Arthritis; Osteoarthritis; Osteomyelitis Neurologic Medical History: Negative for: Dementia; Neuropathy; Quadriplegia; Paraplegia; Seizure Disorder Oncologic Medical History: Negative for: Received Chemotherapy; Received Radiation Immunizations Pneumococcal Vaccine: Received Pneumococcal Vaccination: Yes Implantable Devices No devices added Family and Social History Cancer: No; Diabetes: Yes - Mother; Heart Disease: Yes - Father; Hereditary Spherocytosis: No; Hypertension: No; Kidney Disease: No; Lung Disease: No; Seizures: No; Stroke: Yes - Father; Thyroid Problems: No; Tuberculosis: No; Never smoker; Marital Status - Widowed; Alcohol Use: Never; Drug Use: No History; Caffeine Use: Moderate; Financial Concerns: No; Food, Clothing or Shelter Needs: No; Support System Lacking: No; Transportation Concerns: No Physician Affirmation I have reviewed and agree with the above information. IOAN, LANDINI ([id number]) Electronic Signature(s) Signed: 05/02/2019 3:19:36 PM By: Worthy Keeler PA-C Signed: 05/03/2019 4:44:18 PM By: Montey Hora Entered By: Worthy Keeler on 05/02/2019 15:08:00 Pfalzgraf, [name] ([id number]) -------------------------------------------------------------------------------- Total Contact Cast Details Patient Name: MADDEX, GARLITZ Date of Service: 05/02/2019 9:15 AM Medical Record Number: [id number] Patient Account Number: [id number] Date of Birth/Sex: 11-27-1946 (73 y.o. M) Treating RN: Montey Hora Primary Care Provider: Glendon Axe Other Clinician: Referring Provider: Glendon Axe Treating  Provider/Extender: STONE III, HOYT Weeks in Treatment: 20 Total Contact Cast Applied for Wound Assessment: Wound #1R Left Toe Great Performed By: Physician Emilio Math., PA-C Post Procedure Diagnosis Same as Pre-procedure Electronic Signature(s) Signed: 05/02/2019 1:35:27 PM By: Montey Hora Signed: 05/02/2019 3:19:36 PM By: Worthy Keeler PA-C Entered By: Montey Hora on 05/02/2019 10:08:46 [place], [name] ([id number]) -------------------------------------------------------------------------------- SuperBill Details Patient Name: [name]. Date of Service: 05/02/2019 Medical Record Number: [id number] Patient Account Number: [id number] Date of Birth/Sex: 09-03-1946 (73 y.o. M) Treating RN: Montey Hora Primary Care Provider: Glendon Axe Other Clinician: Referring Provider: Glendon Axe Treating Provider/Extender: Melburn Hake, HOYT Weeks in Treatment: 20 Diagnosis Coding ICD-10 Codes Code Description E11.621 Type 2 diabetes mellitus with foot ulcer L97.522 Non-pressure chronic ulcer of other part of left foot with fat layer exposed I10 Essential (primary) hypertension E11.43 Type 2 diabetes mellitus with diabetic autonomic (poly)neuropathy Facility Procedures CPT4 Code: [id number] Description: 61443 - DEB SUBQ TISSUE 20 SQ CM/< ICD-10 Diagnosis Description L97.522 Non-pressure chronic ulcer of other part of left foot with fat Modifier: layer exposed Quantity: 1 Physician Procedures CPT4 Code: [id number] Description: 11042 - WC PHYS SUBQ TISS 20 SQ CM ICD-10 Diagnosis Description L97.522 Non-pressure chronic ulcer of other part of left foot with fat Modifier: layer exposed Quantity: 1 Electronic Signature(s) Signed: 05/02/2019 3:19:36 PM By: Worthy Keeler PA-C Entered By: Worthy Keeler on 05/02/2019 15:09:13

## 2019-05-09 ENCOUNTER — Encounter: Payer: Medicare Other | Admitting: Physician Assistant

## 2019-05-09 ENCOUNTER — Other Ambulatory Visit: Payer: Self-pay

## 2019-05-09 DIAGNOSIS — E11621 Type 2 diabetes mellitus with foot ulcer: Secondary | ICD-10-CM | POA: Diagnosis not present

## 2019-05-11 NOTE — Progress Notes (Signed)
[name], [name] ([id number]) Visit Report for 05/09/2019 Chief Complaint Document Details Patient Name: [name], [name]. Date of Service: 05/09/2019 8:15 AM Medical Record Number: [id number] Patient Account Number: [id number] Date of Birth/Sex: Oct 02, 1946 (73 y.o. M) Treating RN: Montey Hora Primary Care Provider: Glendon Axe Other Clinician: Referring Provider: Glendon Axe Treating Provider/Extender: Melburn Hake, Bonnie Overdorf Weeks in Treatment: 21 Information Obtained from: Patient Chief Complaint Left great toe ulcer Electronic Signature(s) Signed: 05/11/2019 3:58:08 AM By: Worthy Keeler PA-C Entered By: Worthy Keeler on 05/09/2019 08:19:47 [place], [name] ([id number]) -------------------------------------------------------------------------------- HPI Details Patient Name: [name] Date of Service: 05/09/2019 8:15 AM Medical Record Number: [id number] Patient Account Number: [id number] Date of Birth/Sex: [birth year] (73 y.o. M) Treating RN: Montey Hora Primary Care Provider: Glendon Axe Other Clinician: Referring Provider: Glendon Axe Treating Provider/Extender: Melburn Hake, Amera Banos Weeks in Treatment: 21 History of Present Illness HPI Description: 12/12/18 on evaluation today patient presents as a referral from his endocrinologist regarding issues that he has been having with his left great toe. Subsequently he has been seeing Dr. Cleda Mccreedy and Dr. Cleda Mccreedy has been the breeding the wound and in fact this was debrided this morning. The patient had an appointment there prior to coming here. Subsequently he states that even though he's been going for regular debridement after office things really do not seem to be showing signs of improvement unfortunately. He states that he is having some discomfort but nothing too significant. No fevers, chills, nausea, or vomiting noted at this time. The patient does have a history of hypertension, neuropathy,  diabetes mellitus type II, and a history of stroke. Currently he's been using bacitracin on the toe and utilizing a Band-Aid. He tells me he's had this wound for two years. He cannot remember the last time he had an x-ray. 12/19/18 on evaluation today patient appears to be doing well in regard to his plantar toe ulcer. He's been tolerating the dressing changes without complication. Fortunately there does not appear to be signs of infection at this time which is good news. No fever chills noted. 01/19/19 has been one month since I last saw the patient as he was out of town. With that being said on evaluation today it does appear that he is going to require sharp debridement and I do believe that the total contact cast would benefit him as far as being applied at this time. He is in agreement that plan. 01/23/19 on evaluation today patient appears to be doing better in regard to his left great toe ulcer. He has been shown signs of improvement week by week and although this is slow it does seem to be doing fairly well which is good news. Overall very pleased in this regard. 01/31/19 on evaluation today patient presents for follow-up concerning his great toe ulcer. He was actually supposed to be here yesterday and he did come however he ended up having to leave due to his blood sugar dropping he states he just did not eat enough lunch. Fortunately seems to be doing much better today which is excellent news. Fortunately there's no evidence of infection at this time which is also good news. No fevers, chills, nausea, or vomiting noted at this time. Overall I feel like he is making excellent progress. 02/06/19 on evaluation today patient actually appears to be doing very well in regard to his plantar toe ulcer. He's been tolerating the dressing changes without complication. Fortunately there is no sign of infection at this time. Overall been  very pleased with how things seem to be progressing. No fevers,  chills, nausea, or vomiting noted at this time. 02/13/19 on evaluation today patient actually appears to be doing excellent in regard to his toe ulcer which is almost completely close. Overall very pleased with that. There does not appear to be any signs of infection which is good news. Upon close inspection it appears that he has just a very slight opening still noted at the central portion of the toe although this is minimal. 02/16/19 on evaluation today patient is seen for early follow-up due to the fact that he tells me while at home he actually got up to go answer the door without putting on the walking boot part of his cast and subsequently damaged the total contact cast. It therefore comes in to have this removed and potentially replaced. Fortunately other than it given him a little bit of pain its far as pushing in on some areas there doesn't appear to be any injury once the cast was removed. 03/13/19 on evaluation today patient unfortunately presents for follow-up concerning his great toe ulcer on the left. He was discharged on 02/16/19 with the wound healed at that point. He tells me this remain so for several weeks or least a couple weeks before reopening. Fortunately there does not appear to be any signs of infection at this time. Nonetheless for the past week at least he's had the wound reopened and states it is been trying to keep pressure off of this but he has not been using the offloading shoe we previously gave him. Fortunately there's no evidence of infection at this time. No fevers, chills, nausea, or vomiting noted at this time. [name], [name] ([id number]) 03/17/19 on evaluation today patient actually appears to be doing rather well in regard to his toe also. Fact this appears to be significantly smaller this week even compared to what I saw last week on evaluation. He seems to have done very well for the offloading shoe and overall I'm very pleased with the progress that is  made. It may be that he doesn't even need the cast to get this to heal if he offloads this appropriately. 03/24/19 on evaluation today patient actually appears to be doing well in regard to his plantar foot ulcer. He's been tolerating the dressing changes without complication the collagen does seem to be beneficial for him. With that being said he is gonna require some miles sharp debridement today to clear away some of the callous's around the edge of the wound as well as the minimal Slough noted on the surface of the wound. 03/31/19 on evaluation today patient appears to be doing well in regard to his plantar great toe ulcer. He has been tolerating the dressing changes without complication which is good news. Fortunately there does not appear to be any signs of active infection at this time. Overall very pleased with how things seem to be progressing. 04/11/19 on evaluation today patient's tools are appears to be doing in my pinion roughly about the same as were things that previous. Fortunately there does not appear to be signs of active infection at this time which is good news. With that being said he has not been experiencing any pain at this time which is good news there is also No fevers, chills, nausea, or vomiting noted at this time. 04/18/19 on evaluation today patient's wound actually appears to be doing fairly well today he did not even really have a lot of  callous buildup. We have been debating on whether or not to reinitiate total contact cast. With that being said he seems to be doing fairly well this point with offloading in my pinion and again I think we need to come up with a way to get this healed that will also be sustainable for keeping it close. Again we were able to close it with the cast previous but again he has to be able to keep it such. For that reason we are working on trying to get him diabetic shoes he's waiting for the [place] to get in touch with the local company that has  to measure him for these do not want to have a cast on at such a time as when they need to actually measure him. 04/25/19 on evaluation today patient appears to be doing well in regard to his plantar foot specifically great toe ulcer. He's been tolerating the dressing changes without complication. Fortunately there's no signs of active infection at this time. Overall I'm very pleased with how things appear currently. 05/02/19 on evaluation today patient appears to actually be doing better in regard to his plantar foot ulcer. He shown signs of good improvement which is excellent news. She states he can walk better with the cast on the can with the offloading shoe that he had on previous. Fortunately there's no signs of active infection at this time. No fevers, chills, nausea, or vomiting noted at this time. 05/09/19 on evaluation today patient actually appears to be doing excellent. In fact he appears to be completely healed based on evaluation at this time. Fortunately there's no signs of infection and is having no discomfort. Electronic Signature(s) Signed: 05/11/2019 3:58:08 AM By: Worthy Keeler PA-C Entered By: Worthy Keeler on 05/09/2019 12:58:40 [name], [name] ([id number]) -------------------------------------------------------------------------------- Physical Exam Details Patient Name: [name] Date of Service: 05/09/2019 8:15 AM Medical Record Number: [id number] Patient Account Number: [id number] Date of Birth/Sex: Dec 07, 1946 (73 y.o. M) Treating RN: Montey Hora Primary Care Provider: Glendon Axe Other Clinician: Referring Provider: Glendon Axe Treating Provider/Extender: STONE III, Idamae Coccia Weeks in Treatment: 21 Constitutional Well-nourished and well-hydrated in no acute distress. Respiratory normal breathing without difficulty. Psychiatric this patient is able to make decisions and demonstrates good insight into disease process. Alert and Oriented x  3. pleasant and cooperative. Notes Patient's wound bed currently showed signs of complete epithelialization it was a little bit of callous around the edge of the wound although there did not appear to be any evidence whatsoever that I could pinpoint in the previous wound open site. Nonetheless he seems to done very well with the total contact cast which is excellent news. Electronic Signature(s) Signed: 05/11/2019 3:58:08 AM By: Worthy Keeler PA-C Entered By: Worthy Keeler on 05/09/2019 12:59:15 [name], [name] ([id number]) -------------------------------------------------------------------------------- Physician Orders Details Patient Name: [name] Date of Service: 05/09/2019 8:15 AM Medical Record Number: [id number] Patient Account Number: [id number] Date of Birth/Sex: 11-04-1946 (73 y.o. M) Treating RN: Montey Hora Primary Care Provider: Glendon Axe Other Clinician: Referring Provider: Glendon Axe Treating Provider/Extender: Melburn Hake, Lorence Nagengast Weeks in Treatment: 21 Verbal / Phone Orders: No Diagnosis Coding ICD-10 Coding Code Description E11.621 Type 2 diabetes mellitus with foot ulcer L97.522 Non-pressure chronic ulcer of other part of left foot with fat layer exposed I10 Essential (primary) hypertension E11.43 Type 2 diabetes mellitus with diabetic autonomic (poly)neuropathy Discharge From [facility] o Discharge from [place] Signature(s) Signed: 05/09/2019 4:10:04 PM By: Marjory Lies,  Di Kindle Signed: 05/11/2019 3:58:08 AM By: Worthy Keeler PA-C Entered By: Montey Hora on 05/09/2019 08:39:13 [name], [name] ([id number]) -------------------------------------------------------------------------------- Problem List Details Patient Name: [name], [name]. Date of Service: 05/09/2019 8:15 AM Medical Record Number: [id number] Patient Account Number: [id number] Date of Birth/Sex: 03/11/1946 (73 y.o. M) Treating RN:  Montey Hora Primary Care Provider: Glendon Axe Other Clinician: Referring Provider: Glendon Axe Treating Provider/Extender: Melburn Hake, Jawara Latorre Weeks in Treatment: 21 Active Problems ICD-10 Evaluated Encounter Code Description Active Date Today Diagnosis E11.621 Type 2 diabetes mellitus with foot ulcer 12/12/2018 No Yes L97.522 Non-pressure chronic ulcer of other part of left foot with fat 12/12/2018 No Yes layer exposed I10 Essential (primary) hypertension 12/12/2018 No Yes E11.43 Type 2 diabetes mellitus with diabetic autonomic (poly) 12/12/2018 No Yes neuropathy Inactive Problems Resolved Problems Electronic Signature(s) Signed: 05/11/2019 3:58:08 AM By: Worthy Keeler PA-C Entered By: Worthy Keeler on 05/09/2019 08:19:42 [name], [name] ([id number]) -------------------------------------------------------------------------------- Progress Note Details Patient Name: [name] Date of Service: 05/09/2019 8:15 AM Medical Record Number: [id number] Patient Account Number: [id number] Date of Birth/Sex: 25-Oct-1946 (73 y.o. M) Treating RN: Montey Hora Primary Care Provider: Glendon Axe Other Clinician: Referring Provider: Glendon Axe Treating Provider/Extender: Melburn Hake, Bulmaro Feagans Weeks in Treatment: 21 Subjective Chief Complaint Information obtained from Patient Left great toe ulcer History of Present Illness (HPI) 12/12/18 on evaluation today patient presents as a referral from his endocrinologist regarding issues that he has been having with his left great toe. Subsequently he has been seeing Dr. Cleda Mccreedy and Dr. Cleda Mccreedy has been the breeding the wound and in fact this was debrided this morning. The patient had an appointment there prior to coming here. Subsequently he states that even though he's been going for regular debridement after office things really do not seem to be showing signs of improvement unfortunately. He states that he is having some  discomfort but nothing too significant. No fevers, chills, nausea, or vomiting noted at this time. The patient does have a history of hypertension, neuropathy, diabetes mellitus type II, and a history of stroke. Currently he's been using bacitracin on the toe and utilizing a Band-Aid. He tells me he's had this wound for two years. He cannot remember the last time he had an x-ray. 12/19/18 on evaluation today patient appears to be doing well in regard to his plantar toe ulcer. He's been tolerating the dressing changes without complication. Fortunately there does not appear to be signs of infection at this time which is good news. No fever chills noted. 01/19/19 has been one month since I last saw the patient as he was out of town. With that being said on evaluation today it does appear that he is going to require sharp debridement and I do believe that the total contact cast would benefit him as far as being applied at this time. He is in agreement that plan. 01/23/19 on evaluation today patient appears to be doing better in regard to his left great toe ulcer. He has been shown signs of improvement week by week and although this is slow it does seem to be doing fairly well which is good news. Overall very pleased in this regard. 01/31/19 on evaluation today patient presents for follow-up concerning his great toe ulcer. He was actually supposed to be here yesterday and he did come however he ended up having to leave due to his blood sugar dropping he states he just did not eat enough lunch. Fortunately seems to be doing much  better today which is excellent news. Fortunately there's no evidence of infection at this time which is also good news. No fevers, chills, nausea, or vomiting noted at this time. Overall I feel like he is making excellent progress. 02/06/19 on evaluation today patient actually appears to be doing very well in regard to his plantar toe ulcer. He's been tolerating the dressing changes  without complication. Fortunately there is no sign of infection at this time. Overall been very pleased with how things seem to be progressing. No fevers, chills, nausea, or vomiting noted at this time. 02/13/19 on evaluation today patient actually appears to be doing excellent in regard to his toe ulcer which is almost completely close. Overall very pleased with that. There does not appear to be any signs of infection which is good news. Upon close inspection it appears that he has just a very slight opening still noted at the central portion of the toe although this is minimal. 02/16/19 on evaluation today patient is seen for early follow-up due to the fact that he tells me while at home he actually got up to go answer the door without putting on the walking boot part of his cast and subsequently damaged the total contact cast. It therefore comes in to have this removed and potentially replaced. Fortunately other than it given him a little bit of pain its far as pushing in on some areas there doesn't appear to be any injury once the cast was removed. [name], [name] ([id number]) 03/13/19 on evaluation today patient unfortunately presents for follow-up concerning his great toe ulcer on the left. He was discharged on 02/16/19 with the wound healed at that point. He tells me this remain so for several weeks or least a couple weeks before reopening. Fortunately there does not appear to be any signs of infection at this time. Nonetheless for the past week at least he's had the wound reopened and states it is been trying to keep pressure off of this but he has not been using the offloading shoe we previously gave him. Fortunately there's no evidence of infection at this time. No fevers, chills, nausea, or vomiting noted at this time. 03/17/19 on evaluation today patient actually appears to be doing rather well in regard to his toe also. Fact this appears to be significantly smaller this week even  compared to what I saw last week on evaluation. He seems to have done very well for the offloading shoe and overall I'm very pleased with the progress that is made. It may be that he doesn't even need the cast to get this to heal if he offloads this appropriately. 03/24/19 on evaluation today patient actually appears to be doing well in regard to his plantar foot ulcer. He's been tolerating the dressing changes without complication the collagen does seem to be beneficial for him. With that being said he is gonna require some miles sharp debridement today to clear away some of the callous's around the edge of the wound as well as the minimal Slough noted on the surface of the wound. 03/31/19 on evaluation today patient appears to be doing well in regard to his plantar great toe ulcer. He has been tolerating the dressing changes without complication which is good news. Fortunately there does not appear to be any signs of active infection at this time. Overall very pleased with how things seem to be progressing. 04/11/19 on evaluation today patient's tools are appears to be doing in my pinion roughly about the  same as were things that previous. Fortunately there does not appear to be signs of active infection at this time which is good news. With that being said he has not been experiencing any pain at this time which is good news there is also No fevers, chills, nausea, or vomiting noted at this time. 04/18/19 on evaluation today patient's wound actually appears to be doing fairly well today he did not even really have a lot of callous buildup. We have been debating on whether or not to reinitiate total contact cast. With that being said he seems to be doing fairly well this point with offloading in my pinion and again I think we need to come up with a way to get this healed that will also be sustainable for keeping it close. Again we were able to close it with the cast previous but again he has to be  able to keep it such. For that reason we are working on trying to get him diabetic shoes he's waiting for the [place] to get in touch with the local company that has to measure him for these do not want to have a cast on at such a time as when they need to actually measure him. 04/25/19 on evaluation today patient appears to be doing well in regard to his plantar foot specifically great toe ulcer. He's been tolerating the dressing changes without complication. Fortunately there's no signs of active infection at this time. Overall I'm very pleased with how things appear currently. 05/02/19 on evaluation today patient appears to actually be doing better in regard to his plantar foot ulcer. He shown signs of good improvement which is excellent news. She states he can walk better with the cast on the can with the offloading shoe that he had on previous. Fortunately there's no signs of active infection at this time. No fevers, chills, nausea, or vomiting noted at this time. 05/09/19 on evaluation today patient actually appears to be doing excellent. In fact he appears to be completely healed based on evaluation at this time. Fortunately there's no signs of infection and is having no discomfort. Patient History Information obtained from Patient. Family History Diabetes - Mother, Heart Disease - Father, Stroke - Father, No family history of Cancer, Hereditary Spherocytosis, Hypertension, Kidney Disease, Lung Disease, Seizures, Thyroid Problems, Tuberculosis. Social History Never smoker, Marital Status - Widowed, Alcohol Use - Never, Drug Use - No History, Caffeine Use - Moderate. Medical History Eyes CALIPH, BOROWIAK ([id number]) Denies history of Cataracts, Glaucoma, Optic Neuritis Ear/Nose/Mouth/Throat Denies history of Chronic sinus problems/congestion, Middle ear problems Hematologic/Lymphatic Denies history of Anemia, Hemophilia, Human Immunodeficiency Virus, Lymphedema Respiratory Denies  history of Aspiration, Asthma, Chronic Obstructive Pulmonary Disease (COPD), Pneumothorax, Sleep Apnea, Tuberculosis Cardiovascular Patient has history of Hypertension Denies history of Angina, Arrhythmia, Congestive Heart Failure, Coronary Artery Disease, Deep Vein Thrombosis, Hypotension, Myocardial Infarction, Peripheral Arterial Disease, Peripheral Venous Disease, Phlebitis, Vasculitis Gastrointestinal Denies history of Cirrhosis , Colitis, Crohn s, Hepatitis A, Hepatitis B, Hepatitis C Endocrine Patient has history of Type II Diabetes Denies history of Type I Diabetes Genitourinary Denies history of End Stage Renal Disease Immunological Denies history of Lupus Erythematosus, Raynaud s, Scleroderma Integumentary (Skin) Denies history of History of Burn, History of pressure wounds Musculoskeletal Denies history of Gout, Rheumatoid Arthritis, Osteoarthritis, Osteomyelitis Neurologic Denies history of Dementia, Neuropathy, Quadriplegia, Paraplegia, Seizure Disorder Oncologic Denies history of Received Chemotherapy, Received Radiation Review of Systems (ROS) Constitutional Symptoms (General Health) Denies complaints or symptoms of Fatigue, Fever,  Chills, Marked Weight Change. Respiratory Denies complaints or symptoms of Chronic or frequent coughs, Shortness of Breath. Cardiovascular Denies complaints or symptoms of Chest pain, LE edema. Psychiatric Denies complaints or symptoms of Anxiety, Claustrophobia. Objective Constitutional Well-nourished and well-hydrated in no acute distress. Vitals Time Taken: 8:19 AM, Height: 72 in, Weight: 250 lbs, BMI: 33.9, Temperature: 97.8 F, Pulse: 72 bpm, Respiratory Rate: 16 breaths/min, Blood Pressure: 144/72 mmHg. Respiratory normal breathing without difficulty. [name], [name] ([id number]) Psychiatric this patient is able to make decisions and demonstrates good insight into disease process. Alert and Oriented x 3. pleasant and  cooperative. General Notes: Patient's wound bed currently showed signs of complete epithelialization it was a little bit of callous around the edge of the wound although there did not appear to be any evidence whatsoever that I could pinpoint in the previous wound open site. Nonetheless he seems to done very well with the total contact cast which is excellent news. Integumentary (Hair, Skin) Wound #1R status is Healed - Epithelialized. Original cause of wound was Gradually Appeared. The wound is located on the Left Toe Great. The wound measures 0cm length x 0cm width x 0cm depth; 0cm^2 area and 0cm^3 volume. There is Fat Layer (Subcutaneous Tissue) Exposed exposed. There is no tunneling or undermining noted. There is a medium amount of serosanguineous drainage noted. The wound margin is thickened. There is large (67-100%) pale, hyper - granulation within the wound bed. There is no necrotic tissue within the wound bed. The periwound skin appearance exhibited: Callus. The periwound skin appearance did not exhibit: Crepitus, Excoriation, Induration, Rash, Scarring, Dry/Scaly, Maceration, Atrophie Blanche, Cyanosis, Ecchymosis, Hemosiderin Staining, Mottled, Pallor, Rubor, Erythema. Periwound temperature was noted as No Abnormality. Assessment Active Problems ICD-10 Type 2 diabetes mellitus with foot ulcer Non-pressure chronic ulcer of other part of left foot with fat layer exposed Essential (primary) hypertension Type 2 diabetes mellitus with diabetic autonomic (poly)neuropathy Plan Discharge From [facility] Services: Discharge from [place] My suggestion at this point is gonna be that we discontinue wound care services I did recommend that he continue to utilize the offloading shoe which is a front offload her for the time being until we can get the shoes which are gonna be custom for him that the [place] is ordering. He's having a difficult time getting everything settled in that regard.  Fortunately there's no signs of active infection at this time. If anything changes worsens meantime he will contact the office and let me know otherwise I'll see him back as needed going forward. I did recommend orthopedic offloading felt as well in order to help prevent any additional breakdown at the wound site. Electronic Signature(s) Signed: 05/11/2019 3:58:08 AM By: Worthy Keeler PA-C Entered By: Worthy Keeler on 05/09/2019 13:00:09 [name], [name] ([id number]) [name], [name] ([id number]) -------------------------------------------------------------------------------- ROS/PFSH Details Patient Name: [name] Date of Service: 05/09/2019 8:15 AM Medical Record Number: [id number] Patient Account Number: [id number] Date of Birth/Sex: May 14, 1946 (73 y.o. M) Treating RN: Montey Hora Primary Care Provider: Glendon Axe Other Clinician: Referring Provider: Glendon Axe Treating Provider/Extender: STONE III, Ottilie Wigglesworth Weeks in Treatment: 21 Information Obtained From Patient Constitutional Symptoms (General Health) Complaints and Symptoms: Negative for: Fatigue; Fever; Chills; Marked Weight Change Respiratory Complaints and Symptoms: Negative for: Chronic or frequent coughs; Shortness of Breath Medical History: Negative for: Aspiration; Asthma; Chronic Obstructive Pulmonary Disease (COPD); Pneumothorax; Sleep Apnea; Tuberculosis Cardiovascular Complaints and Symptoms: Negative for: Chest pain; LE edema Medical History: Positive for: Hypertension Negative for:  Angina; Arrhythmia; Congestive Heart Failure; Coronary Artery Disease; Deep Vein Thrombosis; Hypotension; Myocardial Infarction; Peripheral Arterial Disease; Peripheral Venous Disease; Phlebitis; Vasculitis Psychiatric Complaints and Symptoms: Negative for: Anxiety; Claustrophobia Eyes Medical History: Negative for: Cataracts; Glaucoma; Optic Neuritis Ear/Nose/Mouth/Throat Medical  History: Negative for: Chronic sinus problems/congestion; Middle ear problems Hematologic/Lymphatic Medical History: Negative for: Anemia; Hemophilia; Human Immunodeficiency Virus; Lymphedema Gastrointestinal CHAI, ROUTH ([id number]) Medical History: Negative for: Cirrhosis ; Colitis; Crohnos; Hepatitis A; Hepatitis B; Hepatitis C Endocrine Medical History: Positive for: Type II Diabetes Negative for: Type I Diabetes Time with diabetes: 1990 Treated with: Insulin Blood sugar tested every day: Yes Tested : Genitourinary Medical History: Negative for: End Stage Renal Disease Immunological Medical History: Negative for: Lupus Erythematosus; Raynaudos; Scleroderma Integumentary (Skin) Medical History: Negative for: History of Burn; History of pressure wounds Musculoskeletal Medical History: Negative for: Gout; Rheumatoid Arthritis; Osteoarthritis; Osteomyelitis Neurologic Medical History: Negative for: Dementia; Neuropathy; Quadriplegia; Paraplegia; Seizure Disorder Oncologic Medical History: Negative for: Received Chemotherapy; Received Radiation Immunizations Pneumococcal Vaccine: Received Pneumococcal Vaccination: Yes Implantable Devices No devices added Family and Social History Cancer: No; Diabetes: Yes - Mother; Heart Disease: Yes - Father; Hereditary Spherocytosis: No; Hypertension: No; Kidney Disease: No; Lung Disease: No; Seizures: No; Stroke: Yes - Father; Thyroid Problems: No; Tuberculosis: No; Never smoker; Marital Status - Widowed; Alcohol Use: Never; Drug Use: No History; Caffeine Use: Moderate; Financial Concerns: No; Food, Clothing or Shelter Needs: No; Support System Lacking: No; Transportation Concerns: No Physician Affirmation I have reviewed and agree with the above information. [name], [name] ([id number]) Electronic Signature(s) Signed: 05/09/2019 4:10:04 PM By: Montey Hora Signed: 05/11/2019 3:58:08 AM By: Worthy Keeler  PA-C Entered By: Worthy Keeler on 05/09/2019 12:59:01 Shenberger, [name] ([id number]) -------------------------------------------------------------------------------- SuperBill Details Patient Name: [name] Date of Service: 05/09/2019 Medical Record Number: [id number] Patient Account Number: [id number] Date of Birth/Sex: [birth year] (73 y.o. M) Treating RN: Montey Hora Primary Care Provider: Glendon Axe Other Clinician: Referring Provider: Glendon Axe Treating Provider/Extender: Melburn Hake, Miu Chiong Weeks in Treatment: 21 Diagnosis Coding ICD-10 Codes Code Description E11.621 Type 2 diabetes mellitus with foot ulcer L97.522 Non-pressure chronic ulcer of other part of left foot with fat layer exposed I10 Essential (primary) hypertension E11.43 Type 2 diabetes mellitus with diabetic autonomic (poly)neuropathy Facility Procedures CPT4 Code: 55208022 Description: 99213 - WOUND CARE VISIT-LEV 3 EST PT Modifier: Quantity: 1 Physician Procedures CPT4 Code: 3361224 Description: 49753 - WC PHYS LEVEL 3 - EST PT ICD-10 Diagnosis Description E11.621 Type 2 diabetes mellitus with foot ulcer L97.522 Non-pressure chronic ulcer of other part of left foot with fat I10 Essential (primary) hypertension E11.43 Type 2 diabetes  mellitus with diabetic autonomic (poly)neuropa Modifier: layer exposed thy Quantity: 1 Electronic Signature(s) Signed: 05/11/2019 3:58:08 AM By: Worthy Keeler PA-C Entered By: Worthy Keeler on 05/09/2019 13:00:24

## 2019-05-11 NOTE — Progress Notes (Signed)
[name], [name] ([id number]) Visit Report for 05/09/2019 Arrival Information Details Patient Name: [name], [name]. Date of Service: 05/09/2019 8:15 AM Medical Record Number: [id number] Patient Account Number: [id number] Date of Birth/Sex: Dec 17, 1946 (73 y.o. M) Treating [name]: [name] Primary Care [name]: [name] Other Clinician: Referring [name]: [name] Treating [name]/Extender: [name], [name]: 21 Visit Information History Since Last Visit Added or deleted any medications: No Patient Arrived: Cane Any new allergies or adverse reactions: No Arrival Time: 08:18 Had a fall or experienced change in No Accompanied By: self activities of daily living that may affect Transfer Assistance: None risk of falls: Patient Identification Verified: Yes Signs or symptoms of abuse/neglect since last visito No Secondary Verification Process Completed: Yes Implantable device outside of the clinic excluding No Patient Has Alerts: Yes cellular tissue based products placed in the center Patient Alerts: DMII since last visit: Has Dressing in Place as Prescribed: Yes Has Footwear/Offloading in Place as Prescribed: Yes Left: Total Contact Cast Pain Present Now: No Electronic Signature(s) Signed: 05/09/2019 4:43:42 PM By: [name], BSN, [name], CWS, [name], BSN Entered By: [name], BSN, [name], CWS, Fred on 05/09/2019 08:18:35 [name], [name] ([id number]) -------------------------------------------------------------------------------- Clinic Level of Care Assessment Details Patient Name: [name]. Date of Service: 05/09/2019 8:15 AM Medical Record Number: [id number] Patient Account Number: [id number] Date of Birth/Sex: Feb 16, 1946 (73 y.o. M) Treating [name]: [name] Primary Care [name]: [name] Other Clinician: Referring [name]: [name] Treating [name]/Extender: [name], [name]: 21 Clinic Level of  Care Assessment Items TOOL 4 Quantity Score []  - Use when only an EandM is performed on FOLLOW-UP visit 0 ASSESSMENTS - Nursing Assessment / Reassessment X - Reassessment of Co-morbidities (includes updates in patient status) 1 10 X- 1 5 Reassessment of Adherence to [name] Plan ASSESSMENTS - Wound and Skin Assessment / Reassessment X - Simple Wound Assessment / Reassessment - one wound 1 5 []  - 0 Complex Wound Assessment / Reassessment - multiple wounds []  - 0 Dermatologic / Skin Assessment (not related to wound area) ASSESSMENTS - Focused Assessment []  - Circumferential Edema Measurements - multi extremities 0 []  - 0 Nutritional Assessment / Counseling / Intervention X- 1 5 Lower Extremity Assessment (monofilament, tuning fork, pulses) []  - 0 Peripheral Arterial Disease Assessment (using hand held doppler) ASSESSMENTS - Ostomy and/or Continence Assessment and Care []  - Incontinence Assessment and Management 0 []  - 0 Ostomy Care Assessment and Management (repouching, etc.) PROCESS - Coordination of Care X - Simple Patient / Family Education for ongoing care 1 15 []  - 0 Complex (extensive) Patient / Family Education for ongoing care X- 1 10 Staff obtains Programmer, systems, Records, Test Results / Process Orders []  - 0 Staff telephones HHA, Nursing Homes / Clarify orders / etc []  - 0 Routine Transfer to another Facility (non-emergent condition) []  - 0 Routine Hospital Admission (non-emergent condition) []  - 0 New Admissions / Biomedical engineer / Ordering NPWT, Apligraf, etc. []  - 0 Emergency Hospital Admission (emergent condition) X- 1 10 Simple Discharge Coordination [name], [name] ([id number]) []  - 0 Complex (extensive) Discharge Coordination PROCESS - Special Needs []  - Pediatric / Minor Patient Management 0 []  - 0 Isolation Patient Management []  - 0 Hearing / Language / Visual special needs []  - 0 Assessment of Community assistance (transportation, D/C  planning, etc.) []  - 0 Additional assistance / Altered mentation []  - 0 Support Surface(s) Assessment (bed, cushion, seat, etc.) INTERVENTIONS - Wound Cleansing / Measurement X - Simple  Wound Cleansing - one wound 1 5 []  - 0 Complex Wound Cleansing - multiple wounds X- 1 5 Wound Imaging (photographs - any number of wounds) []  - 0 Wound Tracing (instead of photographs) X- 1 5 Simple Wound Measurement - one wound []  - 0 Complex Wound Measurement - multiple wounds INTERVENTIONS - Wound Dressings []  - Small Wound Dressing one or multiple wounds 0 []  - 0 Medium Wound Dressing one or multiple wounds []  - 0 Large Wound Dressing one or multiple wounds []  - 0 Application of Medications - topical []  - 0 Application of Medications - injection INTERVENTIONS - Miscellaneous []  - External ear exam 0 []  - 0 Specimen Collection (cultures, biopsies, blood, body fluids, etc.) []  - 0 Specimen(s) / Culture(s) sent or taken to Lab for analysis []  - 0 Patient Transfer (multiple staff / Civil Service fast streamer / Similar devices) []  - 0 Simple Staple / Suture removal (25 or less) []  - 0 Complex Staple / Suture removal (26 or more) []  - 0 Hypo / Hyperglycemic Management (close monitor of Blood Glucose) []  - 0 Ankle / Brachial Index (ABI) - do not check if billed separately X- 1 5 Vital Signs Card, [name] ([id number]) Has the patient been seen at the hospital within the last three years: Yes Total Score: 80 Level Of Care: New/Established - Level 3 Electronic Signature(s) Signed: 05/09/2019 4:10:04 PM By: [name] Entered By: [name] on 05/09/2019 08:50:25 [name] ([id number]) -------------------------------------------------------------------------------- Encounter Discharge Information Details Patient Name: [name]. Date of Service: 05/09/2019 8:15 AM Medical Record Number: [id number] Patient Account Number: [id number] Date of Birth/Sex: 01-18-1946  (73 y.o. M) Treating [name]: [name] Primary Care Xian Alves: [name] Other Clinician: Referring Trianna Lupien: [name] Treating Maricruz Lucero/Extender: [name], [name]: 21 Encounter Discharge Information Items Discharge Condition: Stable Ambulatory Status: Cane Discharge Destination: Home Transportation: Private Auto Accompanied By: self Schedule Follow-up Appointment: No Clinical Summary of Care: Electronic Signature(s) Signed: 05/09/2019 8:51:11 AM By: [name] Entered By: [name] on 05/09/2019 08:51:11 Betker, [name] ([id number]) -------------------------------------------------------------------------------- Lower Extremity Assessment Details Patient Name: [name] Date of Service: 05/09/2019 8:15 AM Medical Record Number: [id number] Patient Account Number: [id number] Date of Birth/Sex: 06-21-1946 (73 y.o. M) Treating [name]: [name] Primary Care Less Woolsey: [name] Other Clinician: Referring Lylliana Kitamura: [name] Treating Azie Mcconahy/Extender: [name], [name]: 21 Vascular Assessment Pulses: Dorsalis Pedis Palpable: [Left:Yes] Electronic Signature(s) Signed: 05/09/2019 4:43:42 PM By: [name], BSN, [name], CWS, [name], BSN Entered By: [name], BSN, [name], CWS, Fred on 05/09/2019 08:27:02 [name] ([id number]) -------------------------------------------------------------------------------- Multi Wound Chart Details Patient Name: [name] Date of Service: 05/09/2019 8:15 AM Medical Record Number: [id number] Patient Account Number: [id number] Date of Birth/Sex: 04-01-1946 (73 y.o. M) Treating [name]: [name] Primary Care Zoiey Christy: [name] Other Clinician: Referring Ananda Caya: [name] Treating Keairra Bardon/Extender: [name], [name]: 21 Vital Signs Height(in): 72 Pulse(bpm): 72 Weight(lbs): 250 Blood Pressure(mmHg): 144/72 Body Mass Index(BMI):  34 Temperature(F): 97.8 Respiratory Rate 16 (breaths/min): Photos: [N/A:N/A] Wound Location: Left Toe Great N/A N/A Wounding Event: Gradually Appeared N/A N/A Primary Etiology: Diabetic Wound/Ulcer of the N/A N/A Lower Extremity Comorbid History: Hypertension, Type II Diabetes N/A N/A Date Acquired: 11/30/2017 N/A N/A Weeks of [name]: 21 N/A N/A Wound Status: Open N/A N/A Wound Recurrence: Yes N/A N/A Measurements L x W x D 0.1x0.1x0.1 N/A N/A (cm) Area (cm) : 0.008 N/A N/A Volume (cm) : 0.001 N/A N/A % Reduction in  Area: 98.70% N/A N/A % Reduction in Volume: 98.40% N/A N/A Classification: Grade 2 N/A N/A Exudate Amount: Medium N/A N/A Exudate Type: Serosanguineous N/A N/A Exudate Color: red, brown N/A N/A Wound Margin: Thickened N/A N/A Granulation Amount: Large (67-100%) N/A N/A Granulation Quality: Pale, Hyper-granulation N/A N/A Necrotic Amount: None Present (0%) N/A N/A Exposed Structures: Fat Layer (Subcutaneous N/A N/A Tissue) Exposed: Yes Fascia: No Tendon: No Muscle: No [name], [name] ([id number]) Joint: No Bone: No Epithelialization: Large (67-100%) N/A N/A Periwound Skin Texture: Callus: Yes N/A N/A Excoriation: No Induration: No Crepitus: No Rash: No Scarring: No Periwound Skin Moisture: Maceration: No N/A N/A Dry/Scaly: No Periwound Skin Color: Atrophie Blanche: No N/A N/A Cyanosis: No Ecchymosis: No Erythema: No Hemosiderin Staining: No Mottled: No Pallor: No Rubor: No Temperature: No Abnormality N/A N/A Tenderness on Palpation: No N/A N/A [name] Notes Electronic Signature(s) Signed: 05/09/2019 4:10:04 PM By: [name] Entered By: [name] on 05/09/2019 08:29:40 Murin, [name] ([id number]) -------------------------------------------------------------------------------- Spencer Details Patient Name: [name]. Date of Service: 05/09/2019 8:15 AM Medical Record Number:  [id number] Patient Account Number: [id number] Date of Birth/Sex: 10-May-1946 (73 y.o. M) Treating [name]: [name] Primary Care Antwian Santaana: [name] Other Clinician: Referring Reinhardt Licausi: [name] Treating Iridiana Fonner/Extender: [name], [name]: 21 Active Inactive Electronic Signature(s) Signed: 05/09/2019 4:10:04 PM By: [name] Signed: 05/09/2019 4:43:42 PM By: [name], BSN, [name], CWS, [name], BSN Entered By: [name] on 05/09/2019 08:33:20 Gutridge, [name] ([id number]) -------------------------------------------------------------------------------- Pain Assessment Details Patient Name: [name], [name]. Date of Service: 05/09/2019 8:15 AM Medical Record Number: [id number] Patient Account Number: [id number] Date of Birth/Sex: 19-Jan-1946 (73 y.o. M) Treating [name]: [name] Primary Care Shedric Fredericks: [name] Other Clinician: Referring Maury Groninger: [name] Treating Suvi Archuletta/Extender: [name], [name]: 21 Active Problems Location of Pain Severity and Description of Pain Patient Has Paino No Site Locations With Dressing Change: No Pain Management and Medication Current Pain Management: Notes Patient denies pain at this time Electronic Signature(s) Signed: 05/09/2019 4:43:42 PM By: [name], BSN, [name], CWS, [name], BSN Entered By: [name], BSN, [name], CWS, Fred on 05/09/2019 08:19:03 [name] ([id number]) -------------------------------------------------------------------------------- Patient/Caregiver Education Details Patient Name: [name] Date of Service: 05/09/2019 8:15 AM Medical Record Number: [id number] Patient Account Number: [id number] Date of Birth/Gender: 1946/02/07 (73 y.o. M) Treating [name]: [name] Primary Care Physician: [name] Other Clinician: Referring Physician: Glendon [name] Treating Physician/Extender: Sharalyn Ink in [name]: 21 Education  Assessment Education Provided To: Patient Education Topics Provided Offloading: Handouts: Other: continue offloading toe Methods: Explain/Verbal Responses: State content correctly Electronic Signature(s) Signed: 05/09/2019 4:10:04 PM By: [name] Entered By: [name] on 05/09/2019 08:50:50 Mikesell, [name] ([id number]) -------------------------------------------------------------------------------- Wound Assessment Details Patient Name: [name]. Date of Service: 05/09/2019 8:15 AM Medical Record Number: [id number] Patient Account Number: [id number] Date of Birth/Sex: 09-Jan-1946 (73 y.o. M) Treating [name]: [name] Primary Care Mizani Dilday: [name] Other Clinician: Referring Detta Mellin: [name] Treating Joselinne Lawal/Extender: [name], [name]: 21 Wound Status Wound Number: [id number] Primary Etiology: Diabetic Wound/Ulcer of the Lower Extremity Wound Location: Left Toe Great Wound Status: Healed - Epithelialized Wounding Event: Gradually Appeared Comorbid Hypertension, Type II Diabetes Date Acquired: 11/30/2017 History: Weeks Of [name]: 21 Clustered Wound: No Photos Wound Measurements Length: (cm) 0 % Reduct Width: (cm) 0 % Reduct Depth: (cm) 0 Epitheli Area: (cm) 0 Tunneli Volume: (cm) 0 Undermi ion in Area: 100% ion in Volume: 100% alization:  Large (67-100%) ng: No ning: No Wound Description Classification: Grade 2 Foul Odo Wound Margin: Thickened Slough/F Exudate Amount: Medium Exudate Type: Serosanguineous Exudate Color: red, brown r After Cleansing: No ibrino No Wound Bed Granulation Amount: Large (67-100%) Exposed Structure Granulation Quality: Pale, Hyper-granulation Fascia Exposed: No Necrotic Amount: None Present (0%) Fat Layer (Subcutaneous Tissue) Exposed: Yes Tendon Exposed: No Muscle Exposed: No Joint Exposed: No Bone Exposed: No Periwound Skin Texture Texture Color Winker,  Deandrae L. ([id number]) No Abnormalities Noted: No No Abnormalities Noted: No Callus: Yes Atrophie Blanche: No Crepitus: No Cyanosis: No Excoriation: No Ecchymosis: No Induration: No Erythema: No Rash: No Hemosiderin Staining: No Scarring: No Mottled: No Pallor: No Moisture Rubor: No No Abnormalities Noted: No Dry / Scaly: No Temperature / Pain Maceration: No Temperature: No Abnormality Electronic Signature(s) Signed: 05/09/2019 4:10:04 PM By: [name] Entered By: [name] on 05/09/2019 08:33:07 [name] ([id number]) -------------------------------------------------------------------------------- Vitals Details Patient Name: [name]. Date of Service: 05/09/2019 8:15 AM Medical Record Number: [id number] Patient Account Number: [id number] Date of Birth/Sex: [birth year] (73 y.o. M) Treating [name]: [name] Primary Care Minna Dumire: [name] Other Clinician: Referring Juwana Thoreson: [name] Treating Riggin Cuttino/Extender: [name], [name]: 21 Vital Signs Time Taken: 08:19 Temperature (F): 97.8 Height (in): 72 Pulse (bpm): 72 Weight (lbs): 250 Respiratory Rate (breaths/min): 16 Body Mass Index (BMI): 33.9 Blood Pressure (mmHg): 144/72 Reference Range: 80 - 120 mg / dl Electronic Signature(s) Signed: 05/09/2019 4:43:42 PM By: [name], BSN, [name], CWS, [name], BSN Entered By: [name], BSN, [name], CWS, Fred on 05/09/2019 [phone]

## 2019-05-25 DIAGNOSIS — Z9989 Dependence on other enabling machines and devices: Secondary | ICD-10-CM | POA: Insufficient documentation

## 2019-06-08 ENCOUNTER — Other Ambulatory Visit: Payer: Self-pay

## 2019-06-08 ENCOUNTER — Encounter: Payer: Medicare Other | Attending: Physician Assistant | Admitting: Physician Assistant

## 2019-06-08 DIAGNOSIS — Z823 Family history of stroke: Secondary | ICD-10-CM | POA: Insufficient documentation

## 2019-06-08 DIAGNOSIS — E11621 Type 2 diabetes mellitus with foot ulcer: Secondary | ICD-10-CM | POA: Insufficient documentation

## 2019-06-08 DIAGNOSIS — L97522 Non-pressure chronic ulcer of other part of left foot with fat layer exposed: Secondary | ICD-10-CM | POA: Diagnosis not present

## 2019-06-08 DIAGNOSIS — I10 Essential (primary) hypertension: Secondary | ICD-10-CM | POA: Insufficient documentation

## 2019-06-08 DIAGNOSIS — Z8249 Family history of ischemic heart disease and other diseases of the circulatory system: Secondary | ICD-10-CM | POA: Diagnosis not present

## 2019-06-08 DIAGNOSIS — Z8673 Personal history of transient ischemic attack (TIA), and cerebral infarction without residual deficits: Secondary | ICD-10-CM | POA: Insufficient documentation

## 2019-06-08 DIAGNOSIS — E1142 Type 2 diabetes mellitus with diabetic polyneuropathy: Secondary | ICD-10-CM | POA: Diagnosis not present

## 2019-06-12 NOTE — Progress Notes (Signed)
HOLTEN, SPANO ([id number]) Visit Report for 06/08/2019 Arrival Information Details Patient Name: [name], [name]. Date of Service: 06/08/2019 12:30 PM Medical Record Number: [id number] Patient Account Number: [id number] Date of Birth/Sex: [birth year] (73 y.o. M) Treating RN: Army Melia Primary Care September Mormile: Glendon Axe Other Clinician: Referring Vraj Denardo: Glendon Axe Treating Jalessa Peyser/Extender: STONE III, HOYT Weeks in Treatment: 25 Visit Information History Since Last Visit Added or deleted any medications: No Patient Arrived: Ambulatory Any new allergies or adverse reactions: No Arrival Time: 12:36 Had a fall or experienced change in No Accompanied By: self activities of daily living that may affect Transfer Assistance: None risk of falls: Patient Has Alerts: Yes Signs or symptoms of abuse/neglect since last visito No Patient Alerts: DMII Hospitalized since last visit: No Has Dressing in Place as Prescribed: Yes Pain Present Now: No Electronic Signature(s) Signed: 06/08/2019 2:15:19 PM By: Army Melia Entered By: Army Melia on 06/08/2019 12:36:26 [name] ([id number]) -------------------------------------------------------------------------------- Encounter Discharge Information Details Patient Name: [name]. Date of Service: 06/08/2019 12:30 PM Medical Record Number: [id number] Patient Account Number: [id number] Date of Birth/Sex: 06-25-1946 (73 y.o. M) Treating RN: Montey Hora Primary Care Constantino Starace: Glendon Axe Other Clinician: Referring Brown Dunlap: Glendon Axe Treating Clark Cuff/Extender: STONE III, HOYT Weeks in Treatment: 25 Encounter Discharge Information Items Post Procedure Vitals Discharge Condition: Stable Temperature (F): 98.3 Ambulatory Status: Cane Pulse (bpm): 66 Discharge Destination: Home Respiratory Rate (breaths/min): 16 Transportation: Private Auto Blood Pressure (mmHg): 172/87 Accompanied  By: self Schedule Follow-up Appointment: Yes Clinical Summary of Care: Electronic Signature(s) Signed: 06/08/2019 3:09:24 PM By: Montey Hora Entered By: Montey Hora on 06/08/2019 13:02:51 [name], [name] ([id number]) -------------------------------------------------------------------------------- Lower Extremity Assessment Details Patient Name: [name]. Date of Service: 06/08/2019 12:30 PM Medical Record Number: [id number] Patient Account Number: [id number] Date of Birth/Sex: 1946-03-17 (73 y.o. M) Treating RN: Army Melia Primary Care Kyndle Schlender: Glendon Axe Other Clinician: Referring Shelie Lansing: Glendon Axe Treating Sabatino Williard/Extender: STONE III, HOYT Weeks in Treatment: 25 Edema Assessment Assessed: [Left: No] [Right: No] Edema: [Left: N] [Right: o] Electronic Signature(s) Signed: 06/08/2019 2:15:19 PM By: Army Melia Entered By: Army Melia on 06/08/2019 12:41:13 [name], [name] -------------------------------------------------------------------------------- Multi Wound Chart Details Patient Name: [name]. Date of Service: 06/08/2019 12:30 PM Medical Record Number: [id number] Patient Account Number: [id number] Date of Birth/Sex: [birth year] (73 y.o. 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] Comorbid History: [1R:Hypertension, Type II Diabetes N/A] Date Acquired: [1R:11/30/2017] [N/A:N/A] Weeks of  Treatment: [1R:25] [N/A:N/A] Wound Status: [1R:Open] [N/A:N/A] Wound Recurrence: [1R:Yes] [N/A:N/A] Measurements L x W x D [1R:0.4x0.4x0.2] [N/A:N/A] (cm) Area (cm) : [1R:0.126] [N/A:N/A] Volume (cm) : [1R:0.025] [N/A:N/A] % Reduction in Area: [1R:80.20%] [N/A:N/A] % Reduction in Volume: [1R:60.90%] [N/A:N/A] Classification: [1R:Grade 2] [N/A:N/A] Exudate Amount: [1R:Medium] [N/A:N/A] Exudate Type: [1R:Serosanguineous] [N/A:N/A] Exudate Color: [1R:red, brown] [N/A:N/A] Wound Margin: [1R:Thickened] [N/A:N/A] Granulation Amount: [1R:Medium (34-66%)] [N/A:N/A] Granulation Quality: [1R:Pale, Hyper-granulation] [N/A:N/A] Necrotic Amount: [1R:Small (1-33%)] [N/A:N/A] Necrotic Tissue: [1R:Eschar] [N/A:N/A] Exposed Structures: [1R:Fat Layer (Subcutaneous Tissue) Exposed: Yes Fascia: No Tendon: No Muscle: No Joint: No Bone: No Small (1-33%)] [N/A:N/A N/A] Treatment Notes [name], [name] ([id number]) Electronic Signature(s) Signed: 06/08/2019 3:09:24 PM By:  Dorthy, Di Kindle Entered By: Montey Hora on 06/08/2019 12:53:43 [name], [name] ([id number]) -------------------------------------------------------------------------------- [place] Details Patient Name: [name], [name]. Date of Service: 06/08/2019 12:30 PM Medical Record Number: [id number] Patient Account Number: [id number] Date of Birth/Sex: 28-Jun-1946 (73 y.o. M) Treating RN: Montey Hora Primary Care Encarnacion Bole: Glendon Axe Other Clinician: Referring Kendrew Paci: Glendon Axe Treating Alper Guilmette/Extender: Melburn Hake, HOYT Weeks in Treatment: 25 Active Inactive Abuse / Safety / Falls / Self Care Management Nursing Diagnoses: Potential for falls Goals: Patient will remain injury free related to falls Date Initiated: 01/19/2019 Target Resolution Date: 07/29/2019 Goal Status: Active Interventions: Assess fall risk on admission and as needed Notes: Orientation to the Wound Care  Program Nursing Diagnoses: Knowledge deficit related to the wound healing center program Goals: Patient/caregiver will verbalize understanding of the [place] Program Date Initiated: 01/19/2019 Target Resolution Date: 08/11/2019 Goal Status: Active Interventions: Provide education on orientation to the wound center Notes: Peripheral Neuropathy Nursing Diagnoses: Knowledge deficit related to disease process and management of peripheral neurovascular dysfunction Goals: Patient/caregiver will verbalize understanding of disease process and disease management Date Initiated: 03/17/2019 Target Resolution Date: 08/26/2019 Goal Status: Active Interventions: Assess signs and symptoms of neuropathy upon admission and as needed [name], [name] ([id number]) Provide education on Management of Neuropathy and Related Ulcers Notes: Wound/Skin Impairment Nursing Diagnoses: Impaired tissue integrity Goals: Ulcer/skin breakdown will have a volume reduction of 30% by week 4 Date Initiated: 12/12/2018 Target Resolution Date: 08/26/2019 Goal Status: Active Interventions: Assess patient/caregiver ability to obtain necessary supplies Assess patient/caregiver ability to perform ulcer/skin care regimen upon admission and as needed Notes: Electronic Signature(s) Signed: 06/08/2019 3:09:24 PM By: Montey Hora Entered By: Montey Hora on 06/08/2019 12:53:30 [name], [name] ([id number]) -------------------------------------------------------------------------------- Pain Assessment Details Patient Name: [name]. Date of Service: 06/08/2019 12:30 PM Medical Record Number: [id number] Patient Account Number: [id number] Date of Birth/Sex: 28-Apr-1946 (73 y.o. M) Treating RN: Army Melia Primary Care Brenson Hartman: Glendon Axe Other Clinician: Referring Mylan Schwarz: Glendon Axe Treating Kishia Shackett/Extender: STONE III, HOYT Weeks in Treatment: 25 Active  Problems Location of Pain Severity and Description of Pain Patient Has Paino No Site Locations Pain Management and Medication Current Pain Management: Electronic Signature(s) Signed: 06/08/2019 2:15:19 PM By: Army Melia Entered By: Army Melia on 06/08/2019 12:37:05 [name] ([id number]) -------------------------------------------------------------------------------- Patient/Caregiver Education Details Patient Name: [name]. Date of Service: 06/08/2019 12:30 PM Medical Record Number: [id number] Patient Account Number: [id number] Date of Birth/Gender: 1946/01/25 (73 y.o. M) Treating RN: Montey Hora Primary Care Physician: Glendon Axe Other Clinician: Referring Physician: Glendon Axe Treating Physician/Extender: Sharalyn Ink in Treatment: 25 Education Assessment Education Provided To: Patient Education Topics Provided Offloading: Handouts: Other: TCC precautions Methods: Explain/Verbal Responses: State content correctly Electronic Signature(s) Signed: 06/08/2019 3:09:24 PM By: Montey Hora Entered By: Montey Hora on 06/08/2019 13:00:32 [name] ([id number]) -------------------------------------------------------------------------------- Wound Assessment Details Patient Name: [name]. Date of Service: 06/08/2019 12:30 PM Medical Record Number: [id number] Patient Account Number: [id number] Date of Birth/Sex: [birth year] (73 y.o. M) Treating RN: Army Melia Primary Care Lynnix Schoneman: Glendon Axe Other Clinician: Referring Christos Mixson: Glendon Axe Treating Cormac Wint/Extender: STONE III, HOYT Weeks in Treatment: 25 Wound Status Wound Number: 1R Primary Etiology: Diabetic Wound/Ulcer of the Lower Extremity Wound Location: Left Toe Great Wound Status: Open Wounding Event: Gradually Appeared Comorbid Hypertension, Type II Diabetes Date Acquired: 11/30/2017 History: Weeks Of Treatment: 25 Clustered  Wound: No Photos Photo Uploaded By: Army Melia on 06/08/2019 14:14:20 Wound Measurements Length: (cm) 0.4 Width: (  cm) 0.4 Depth: (cm) 0.2 Area: (cm) 0.126 Volume: (cm) 0.025 % Reduction in Area: 80.2% % Reduction in Volume: 60.9% Epithelialization: Small (1-33%) Tunneling: No Undermining: No Wound Description Classification: Grade 2 Foul Odo Wound Margin: Thickened Slough/F Exudate Amount: Medium Exudate Type: Serosanguineous Exudate Color: red, brown r After Cleansing: No ibrino No Wound Bed Granulation Amount: Medium (34-66%) Exposed Structure Granulation Quality: Pale, Hyper-granulation Fascia Exposed: No Necrotic Amount: Small (1-33%) Fat Layer (Subcutaneous Tissue) Exposed: Yes Necrotic Quality: Eschar Tendon Exposed: No Muscle Exposed: No Joint Exposed: No Bone Exposed: No Treatment Notes [name], [name] ([id number]) Wound #1R (Left Toe Great) Notes prisma, foam, TCC applied in clinic today Electronic Signature(s) Signed: 06/08/2019 2:15:19 PM By: Army Melia Entered By: Army Melia on 06/08/2019 12:41:02 [name] ([id number]) -------------------------------------------------------------------------------- Vitals Details Patient Name: [name]. Date of Service: 06/08/2019 12:30 PM Medical Record Number: [id number] Patient Account Number: [id number] Date of Birth/Sex: [birth year] (73 y.o. M) Treating RN: Army Melia Primary Care Rennae Ferraiolo: Glendon Axe Other Clinician: Referring Lagretta Loseke: Glendon Axe Treating Abbygael Curtiss/Extender: STONE III, HOYT Weeks in Treatment: 25 Vital Signs Time Taken: 12:37 Temperature (F): 98.3 Height (in): 72 Pulse (bpm): 66 Weight (lbs): 250 Respiratory Rate (breaths/min): 16 Body Mass Index (BMI): 33.9 Blood Pressure (mmHg): 172/87 Reference Range: 80 - 120 mg / dl Electronic Signature(s) Signed: 06/08/2019 2:15:19 PM By: Army Melia Entered By: Army Melia on 06/08/2019  12:37:22

## 2019-06-12 NOTE — Progress Notes (Signed)
[name], [name] ([id number]) Visit Report for 06/08/2019 Chief Complaint Document Details Patient Name: [name], [name]. Date of Service: 06/08/2019 12:30 PM Medical Record Number: [id number] Patient Account Number: [id number] Date of Birth/Sex: 24-Dec-1946 (73 y.o. M) Treating RN: Montey Hora Primary Care Provider: Glendon Axe Other Clinician: Referring Provider: Glendon Axe Treating Provider/Extender: Melburn Hake, Tab Rylee Weeks in Treatment: 25 Information Obtained from: Patient Chief Complaint Left great toe ulcer Electronic Signature(s) Signed: 06/12/2019 12:02:24 PM By: Worthy Keeler PA-C Entered By: Worthy Keeler on 06/08/2019 12:49:00 [name], [name] ([id number]) -------------------------------------------------------------------------------- Debridement Details Patient Name: [name]. Date of Service: 06/08/2019 12:30 PM Medical Record Number: [id number] Patient Account Number: [id number] Date of Birth/Sex: 01-27-1946 (73 y.o. M) Treating RN: Montey Hora Primary Care Provider: Glendon Axe Other Clinician: Referring Provider: Glendon Axe Treating Provider/Extender: STONE III, Angeline Trick Weeks in Treatment: 25 Debridement Performed for Wound #1R Left Toe Great Assessment: Performed By: Physician STONE III, Jahmir Salo E., PA-C Debridement Type: Debridement Severity of Tissue Pre Fat layer exposed Debridement: Level of Consciousness (Pre- Awake and Alert procedure): Pre-procedure Verification/Time Yes - 12:54 Out Taken: Start Time: 12:54 Pain Control: Lidocaine 4% Topical Solution Total Area Debrided (L x W): 0.4 (cm) x 0.4 (cm) = 0.16 (cm) Tissue and other material Viable, Non-Viable, Callus, Slough, Subcutaneous, Skin: Dermis , Skin: Epidermis, Slough debrided: Level: Skin/Subcutaneous Tissue Debridement Description: Excisional Instrument: Curette Bleeding: Minimum Hemostasis Achieved: Pressure End Time: 12:58 Procedural  Pain: 0 Post Procedural Pain: 0 Response to Treatment: Procedure was tolerated well Level of Consciousness Awake and Alert (Post-procedure): Post Debridement Measurements of Total Wound Length: (cm) 0.6 Width: (cm) 0.6 Depth: (cm) 0.3 Volume: (cm) 0.085 Character of Wound/Ulcer Post Debridement: Improved Severity of Tissue Post Debridement: Fat layer exposed Post Procedure Diagnosis Same as Pre-procedure Electronic Signature(s) Signed: 06/08/2019 3:09:24 PM By: Montey Hora Signed: 06/12/2019 12:02:24 PM By: Worthy Keeler PA-C Entered By: Montey Hora on 06/08/2019 12:59:00 [name], [name] ([id number]) -------------------------------------------------------------------------------- HPI Details Patient Name: [name] Date of Service: 06/08/2019 12:30 PM Medical Record Number: [id number] Patient Account Number: [id number] Date of Birth/Sex: [birth year] (73 y.o. M) Treating RN: Montey Hora Primary Care Provider: Glendon Axe Other Clinician: Referring Provider: Glendon Axe Treating Provider/Extender: Melburn Hake, Salisa Broz Weeks in Treatment: 25 History of Present Illness HPI Description: 12/12/18 on evaluation today patient presents as a referral from his endocrinologist regarding issues that he has been having with his left great toe. Subsequently he has been seeing Dr. Cleda Mccreedy and Dr. Cleda Mccreedy has been the breeding the wound and in fact this was debrided this morning. The patient had an appointment there prior to coming here. Subsequently he states that even though he's been going for regular debridement after office things really do not seem to be showing signs of improvement unfortunately. He states that he is having some discomfort but nothing too significant. No fevers, chills, nausea, or vomiting noted at this time. The patient does have a history of hypertension, neuropathy, diabetes mellitus type II, and a history of stroke. Currently he's been using  bacitracin on the toe and utilizing a Band-Aid. He tells me he's had this wound for two years. He cannot remember the last time he had an x-ray. 12/19/18 on evaluation today patient appears to be doing well in regard to his plantar toe ulcer. He's been tolerating the dressing changes without complication. Fortunately there does not appear to be signs of infection at this time which is good news. No fever chills noted. 01/19/19 has been  one month since I last saw the patient as he was out of town. With that being said on evaluation today it does appear that he is going to require sharp debridement and I do believe that the total contact cast would benefit him as far as being applied at this time. He is in agreement that plan. 01/23/19 on evaluation today patient appears to be doing better in regard to his left great toe ulcer. He has been shown signs of improvement week by week and although this is slow it does seem to be doing fairly well which is good news. Overall very pleased in this regard. 01/31/19 on evaluation today patient presents for follow-up concerning his great toe ulcer. He was actually supposed to be here yesterday and he did come however he ended up having to leave due to his blood sugar dropping he states he just did not eat enough lunch. Fortunately seems to be doing much better today which is excellent news. Fortunately there's no evidence of infection at this time which is also good news. No fevers, chills, nausea, or vomiting noted at this time. Overall I feel like he is making excellent progress. 02/06/19 on evaluation today patient actually appears to be doing very well in regard to his plantar toe ulcer. He's been tolerating the dressing changes without complication. Fortunately there is no sign of infection at this time. Overall been very pleased with how things seem to be progressing. No fevers, chills, nausea, or vomiting noted at this time. 02/13/19 on evaluation today  patient actually appears to be doing excellent in regard to his toe ulcer which is almost completely close. Overall very pleased with that. There does not appear to be any signs of infection which is good news. Upon close inspection it appears that he has just a very slight opening still noted at the central portion of the toe although this is minimal. 02/16/19 on evaluation today patient is seen for early follow-up due to the fact that he tells me while at home he actually got up to go answer the door without putting on the walking boot part of his cast and subsequently damaged the total contact cast. It therefore comes in to have this removed and potentially replaced. Fortunately other than it given him a little bit of pain its far as pushing in on some areas there doesn't appear to be any injury once the cast was removed. 03/13/19 on evaluation today patient unfortunately presents for follow-up concerning his great toe ulcer on the left. He was discharged on 02/16/19 with the wound healed at that point. He tells me this remain so for several weeks or least a couple weeks before reopening. Fortunately there does not appear to be any signs of infection at this time. Nonetheless for the past week at least he's had the wound reopened and states it is been trying to keep pressure off of this but he has not been using the offloading shoe we previously gave him. Fortunately there's no evidence of infection at this time. No fevers, chills, nausea, or vomiting noted at this time. [name], [name] ([id number]) 03/17/19 on evaluation today patient actually appears to be doing rather well in regard to his toe also. Fact this appears to be significantly smaller this week even compared to what I saw last week on evaluation. He seems to have done very well for the offloading shoe and overall I'm very pleased with the progress that is made. It may be that he doesn't  even need the cast to get this to heal if  he offloads this appropriately. 03/24/19 on evaluation today patient actually appears to be doing well in regard to his plantar foot ulcer. He's been tolerating the dressing changes without complication the collagen does seem to be beneficial for him. With that being said he is gonna require some miles sharp debridement today to clear away some of the callous's around the edge of the wound as well as the minimal Slough noted on the surface of the wound. 03/31/19 on evaluation today patient appears to be doing well in regard to his plantar great toe ulcer. He has been tolerating the dressing changes without complication which is good news. Fortunately there does not appear to be any signs of active infection at this time. Overall very pleased with how things seem to be progressing. 04/11/19 on evaluation today patient's tools are appears to be doing in my pinion roughly about the same as were things that previous. Fortunately there does not appear to be signs of active infection at this time which is good news. With that being said he has not been experiencing any pain at this time which is good news there is also No fevers, chills, nausea, or vomiting noted at this time. 04/18/19 on evaluation today patient's wound actually appears to be doing fairly well today he did not even really have a lot of callous buildup. We have been debating on whether or not to reinitiate total contact cast. With that being said he seems to be doing fairly well this point with offloading in my pinion and again I think we need to come up with a way to get this healed that will also be sustainable for keeping it close. Again we were able to close it with the cast previous but again he has to be able to keep it such. For that reason we are working on trying to get him diabetic shoes he's waiting for the [place] to get in touch with the local company that has to measure him for these do not want to have a cast on at such a time as  when they need to actually measure him. 04/25/19 on evaluation today patient appears to be doing well in regard to his plantar foot specifically great toe ulcer. He's been tolerating the dressing changes without complication. Fortunately there's no signs of active infection at this time. Overall I'm very pleased with how things appear currently. 05/02/19 on evaluation today patient appears to actually be doing better in regard to his plantar foot ulcer. He shown signs of good improvement which is excellent news. She states he can walk better with the cast on the can with the offloading shoe that he had on previous. Fortunately there's no signs of active infection at this time. No fevers, chills, nausea, or vomiting noted at this time. 05/09/19 on evaluation today patient actually appears to be doing excellent. In fact he appears to be completely healed based on evaluation at this time. Fortunately there's no signs of infection and is having no discomfort. 06/08/19 this is a follow-up visit although the patient has been healed for almost a month but not quite. He tells me that in the past several days he noted some blood coming from his toe and he thought he should come in at this checked out. Fortunately he's having a pain. He also did get his custom orthotics shoes. With that being said he still appears to have developed this ulceration there is  something he tells me that the orthotic specialist stated they could do to the toes to try to help out in this regard although he did not know exactly what it was we may need to [facility] with [facility]. Electronic Signature(s) Signed: 06/12/2019 12:02:24 PM By: Worthy Keeler PA-C Entered By: Worthy Keeler on 06/08/2019 23:06:43 [name] ([id number]) -------------------------------------------------------------------------------- Physical Exam Details Patient Name: [name] Date of Service: 06/08/2019 12:30 PM Medical Record  Number: [id number] Patient Account Number: [id number] Date of Birth/Sex: [birth year] (73 y.o. M) Treating RN: Montey Hora Primary Care Provider: Glendon Axe Other Clinician: Referring Provider: Glendon Axe Treating Provider/Extender: STONE III, Shakir Petrosino Weeks in Treatment: 74 Constitutional Well-nourished and well-hydrated in no acute distress. Respiratory normal breathing without difficulty. clear to auscultation bilaterally. Cardiovascular regular rate and rhythm with normal S1, S2. Psychiatric this patient is able to make decisions and demonstrates good insight into disease process. Alert and Oriented x 3. pleasant and cooperative. Notes Patient's wound bed did require sharp debridement today. Fortunately I was able to perform this without complication post debridement wound bed appears to be doing much better which is excellent news. Electronic Signature(s) Signed: 06/12/2019 12:02:24 PM By: Worthy Keeler PA-C Entered By: Worthy Keeler on 06/08/2019 23:07:16 [place], [name] ([id number]) -------------------------------------------------------------------------------- Physician Orders Details Patient Name: [name] Date of Service: 06/08/2019 12:30 PM Medical Record Number: [id number] Patient Account Number: [id number] Date of Birth/Sex: 1946/11/01 (73 y.o. M) Treating RN: Montey Hora Primary Care Provider: Glendon Axe Other Clinician: Referring Provider: Glendon Axe Treating Provider/Extender: Melburn Hake, Kriston Pasquarello Weeks in Treatment: 25 Verbal / Phone Orders: No Diagnosis Coding ICD-10 Coding Code Description E11.621 Type 2 diabetes mellitus with foot ulcer L97.522 Non-pressure chronic ulcer of other part of left foot with fat layer exposed I10 Essential (primary) hypertension E11.43 Type 2 diabetes mellitus with diabetic autonomic (poly)neuropathy Wound Cleansing Wound #1R Left Toe Great o Clean wound with Normal Saline. o May shower  with protection. - Do not get the cast wet Primary Wound Dressing Wound #1R Left Toe Great o Silver Collagen Secondary Dressing Wound #1R Left Toe Great o Foam Dressing Change Frequency Wound #1R Left Toe Great o Change dressing every week Follow-up Appointments Wound #1R Left Toe Great o Return Appointment in 1 week. Off-Loading Wound #1R Left Toe Great o Total Contact Cast to Left Lower Extremity Electronic Signature(s) Signed: 06/08/2019 3:09:24 PM By: Montey Hora Signed: 06/12/2019 12:02:24 PM By: Worthy Keeler PA-C Entered By: Montey Hora on 06/08/2019 12:59:40 [place], [name] ([id number]) -------------------------------------------------------------------------------- Problem List Details Patient Name: BRONSYN, SHAPPELL. Date of Service: 06/08/2019 12:30 PM Medical Record Number: [id number] Patient Account Number: [id number] Date of Birth/Sex: 08-08-1946 (73 y.o. M) Treating RN: Montey Hora Primary Care Provider: Glendon Axe Other Clinician: Referring Provider: Glendon Axe Treating Provider/Extender: Melburn Hake, Gardner Servantes Weeks in Treatment: 25 Active Problems ICD-10 Evaluated Encounter Code Description Active Date Today Diagnosis E11.621 Type 2 diabetes mellitus with foot ulcer 12/12/2018 No Yes L97.522 Non-pressure chronic ulcer of other part of left foot with fat 12/12/2018 No Yes layer exposed I10 Essential (primary) hypertension 12/12/2018 No Yes E11.43 Type 2 diabetes mellitus with diabetic autonomic (poly) 12/12/2018 No Yes neuropathy Inactive Problems Resolved Problems Electronic Signature(s) Signed: 06/12/2019 12:02:24 PM By: Worthy Keeler PA-C Entered By: Worthy Keeler on 06/08/2019 12:48:54 [place], [name] ([id number]) -------------------------------------------------------------------------------- Progress Note Details Patient Name: [name] Date of Service: 06/08/2019 12:30 PM Medical Record  Number: [id number] Patient Account Number:  [id number] Date of Birth/Sex: 1946/10/07 (73 y.o. M) Treating RN: Montey Hora Primary Care Provider: Glendon Axe Other Clinician: Referring Provider: Glendon Axe Treating Provider/Extender: Melburn Hake, Kashif Pooler Weeks in Treatment: 25 Subjective Chief Complaint Information obtained from Patient Left great toe ulcer History of Present Illness (HPI) 12/12/18 on evaluation today patient presents as a referral from his endocrinologist regarding issues that he has been having with his left great toe. Subsequently he has been seeing Dr. Cleda Mccreedy and Dr. Cleda Mccreedy has been the breeding the wound and in fact this was debrided this morning. The patient had an appointment there prior to coming here. Subsequently he states that even though he's been going for regular debridement after office things really do not seem to be showing signs of improvement unfortunately. He states that he is having some discomfort but nothing too significant. No fevers, chills, nausea, or vomiting noted at this time. The patient does have a history of hypertension, neuropathy, diabetes mellitus type II, and a history of stroke. Currently he's been using bacitracin on the toe and utilizing a Band-Aid. He tells me he's had this wound for two years. He cannot remember the last time he had an x-ray. 12/19/18 on evaluation today patient appears to be doing well in regard to his plantar toe ulcer. He's been tolerating the dressing changes without complication. Fortunately there does not appear to be signs of infection at this time which is good news. No fever chills noted. 01/19/19 has been one month since I last saw the patient as he was out of town. With that being said on evaluation today it does appear that he is going to require sharp debridement and I do believe that the total contact cast would benefit him as far as being applied at this time. He is in agreement that plan. 01/23/19 on  evaluation today patient appears to be doing better in regard to his left great toe ulcer. He has been shown signs of improvement week by week and although this is slow it does seem to be doing fairly well which is good news. Overall very pleased in this regard. 01/31/19 on evaluation today patient presents for follow-up concerning his great toe ulcer. He was actually supposed to be here yesterday and he did come however he ended up having to leave due to his blood sugar dropping he states he just did not eat enough lunch. Fortunately seems to be doing much better today which is excellent news. Fortunately there's no evidence of infection at this time which is also good news. No fevers, chills, nausea, or vomiting noted at this time. Overall I feel like he is making excellent progress. 02/06/19 on evaluation today patient actually appears to be doing very well in regard to his plantar toe ulcer. He's been tolerating the dressing changes without complication. Fortunately there is no sign of infection at this time. Overall been very pleased with how things seem to be progressing. No fevers, chills, nausea, or vomiting noted at this time. 02/13/19 on evaluation today patient actually appears to be doing excellent in regard to his toe ulcer which is almost completely close. Overall very pleased with that. There does not appear to be any signs of infection which is good news. Upon close inspection it appears that he has just a very slight opening still noted at the central portion of the toe although this is minimal. 02/16/19 on evaluation today patient is seen for early follow-up due to the fact that he tells me while at  home he actually got up to go answer the door without putting on the walking boot part of his cast and subsequently damaged the total contact cast. It therefore comes in to have this removed and potentially replaced. Fortunately other than it given him a little bit of pain its far as  pushing in on some areas there doesn't appear to be any injury once the cast was removed. [name], [name] ([id number]) 03/13/19 on evaluation today patient unfortunately presents for follow-up concerning his great toe ulcer on the left. He was discharged on 02/16/19 with the wound healed at that point. He tells me this remain so for several weeks or least a couple weeks before reopening. Fortunately there does not appear to be any signs of infection at this time. Nonetheless for the past week at least he's had the wound reopened and states it is been trying to keep pressure off of this but he has not been using the offloading shoe we previously gave him. Fortunately there's no evidence of infection at this time. No fevers, chills, nausea, or vomiting noted at this time. 03/17/19 on evaluation today patient actually appears to be doing rather well in regard to his toe also. Fact this appears to be significantly smaller this week even compared to what I saw last week on evaluation. He seems to have done very well for the offloading shoe and overall I'm very pleased with the progress that is made. It may be that he doesn't even need the cast to get this to heal if he offloads this appropriately. 03/24/19 on evaluation today patient actually appears to be doing well in regard to his plantar foot ulcer. He's been tolerating the dressing changes without complication the collagen does seem to be beneficial for him. With that being said he is gonna require some miles sharp debridement today to clear away some of the callous's around the edge of the wound as well as the minimal Slough noted on the surface of the wound. 03/31/19 on evaluation today patient appears to be doing well in regard to his plantar great toe ulcer. He has been tolerating the dressing changes without complication which is good news. Fortunately there does not appear to be any signs of active infection at this time. Overall very  pleased with how things seem to be progressing. 04/11/19 on evaluation today patient's tools are appears to be doing in my pinion roughly about the same as were things that previous. Fortunately there does not appear to be signs of active infection at this time which is good news. With that being said he has not been experiencing any pain at this time which is good news there is also No fevers, chills, nausea, or vomiting noted at this time. 04/18/19 on evaluation today patient's wound actually appears to be doing fairly well today he did not even really have a lot of callous buildup. We have been debating on whether or not to reinitiate total contact cast. With that being said he seems to be doing fairly well this point with offloading in my pinion and again I think we need to come up with a way to get this healed that will also be sustainable for keeping it close. Again we were able to close it with the cast previous but again he has to be able to keep it such. For that reason we are working on trying to get him diabetic shoes [facility] with the [facility] company  that has to measure him for these do not want to have a cast on at such a time as when they need to actually measure him. 04/25/19 on evaluation today patient appears to be doing well in regard to his plantar foot specifically great toe ulcer. He's been tolerating the dressing changes without complication. Fortunately there's no signs of active infection at this time. Overall I'm very pleased with how things appear currently. 05/02/19 on evaluation today patient appears to actually be doing better in regard to his plantar foot ulcer. He shown signs of good improvement which is excellent news. She states he can walk better with the cast on the can with the offloading shoe that he had on previous. Fortunately there's no signs of active infection at this time. No fevers, chills, nausea, or vomiting noted at this  time. 05/09/19 on evaluation today patient actually appears to be doing excellent. In fact he appears to be completely healed based on evaluation at this time. Fortunately there's no signs of infection and is having no discomfort. 06/08/19 this is a follow-up visit although the patient has been healed for almost a month but not quite. He tells me that in the past several days he noted some blood coming from his toe and he thought he should come in at this checked out. Fortunately he's having a pain. He also did get his custom orthotics shoes. With that being said he still appears to have developed this ulceration there is something he tells me that the orthotic specialist stated they could do to the toes to try to help out in this regard although he did not know exactly what it was we may need to check with [facility]. Patient History Information obtained from Patient. Family History Diabetes - Mother, Heart Disease - Father, Stroke - Father, No family history of Cancer, Hereditary Spherocytosis, Hypertension, Kidney Disease, Lung Disease, Seizures, Thyroid Problems, Tuberculosis. [name], [name] ([id number]) Social History Never smoker, Marital Status - Widowed, Alcohol Use - Never, Drug Use - No History, Caffeine Use - Moderate. Medical History Eyes Denies history of Cataracts, Glaucoma, Optic Neuritis Ear/Nose/Mouth/Throat Denies history of Chronic sinus problems/congestion, Middle ear problems Hematologic/Lymphatic Denies history of Anemia, Hemophilia, Human Immunodeficiency Virus, Lymphedema Respiratory Denies history of Aspiration, Asthma, Chronic Obstructive Pulmonary Disease (COPD), Pneumothorax, Sleep Apnea, Tuberculosis Cardiovascular Patient has history of Hypertension Denies history of Angina, Arrhythmia, Congestive Heart Failure, Coronary Artery Disease, Deep Vein Thrombosis, Hypotension, Myocardial Infarction, Peripheral Arterial Disease, Peripheral Venous Disease,  Phlebitis, Vasculitis Gastrointestinal Denies history of Cirrhosis , Colitis, Crohn s, Hepatitis A, Hepatitis B, Hepatitis C Endocrine Patient has history of Type II Diabetes Denies history of Type I Diabetes Genitourinary Denies history of End Stage Renal Disease Immunological Denies history of Lupus Erythematosus, Raynaud s, Scleroderma Integumentary (Skin) Denies history of History of Burn, History of pressure wounds Musculoskeletal Denies history of Gout, Rheumatoid Arthritis, Osteoarthritis, Osteomyelitis Neurologic Denies history of Dementia, Neuropathy, Quadriplegia, Paraplegia, Seizure Disorder Oncologic Denies history of Received Chemotherapy, Received Radiation Review of Systems (ROS) Constitutional Symptoms (General Health) Denies complaints or symptoms of Fatigue, Fever, Chills, Marked Weight Change. Respiratory Denies complaints or symptoms of Chronic or frequent coughs, Shortness of Breath. Cardiovascular Denies complaints or symptoms of Chest pain, LE edema. Psychiatric Denies complaints or symptoms of Anxiety, Claustrophobia. Objective Constitutional Well-nourished and well-hydrated in no acute distress. [name], [name] ([id number]) Vitals Time Taken: 12:37 PM, Height: 72 in, Weight: 250 lbs, BMI: 33.9, Temperature: 98.3 F, Pulse: 66 bpm, Respiratory Rate: 16  breaths/min, Blood Pressure: 172/87 mmHg. Respiratory normal breathing without difficulty. clear to auscultation bilaterally. Cardiovascular regular rate and rhythm with normal S1, S2. Psychiatric this patient is able to make decisions and demonstrates good insight into disease process. Alert and Oriented x 3. pleasant and cooperative. General Notes: Patient's wound bed did require sharp debridement today. Fortunately I was able to perform this without complication post debridement wound bed appears to be doing much better which is excellent news. Integumentary (Hair, Skin) Wound #1R status is  Open. Original cause of wound was Gradually Appeared. The wound is located on the Left Toe Great. The wound measures 0.4cm length x 0.4cm width x 0.2cm depth; 0.126cm^2 area and 0.025cm^3 volume. There is Fat Layer (Subcutaneous Tissue) Exposed exposed. There is no tunneling or undermining noted. There is a medium amount of serosanguineous drainage noted. The wound margin is thickened. There is medium (34-66%) pale, hyper - granulation within the wound bed. There is a small (1-33%) amount of necrotic tissue within the wound bed including Eschar. Assessment Active Problems ICD-10 Type 2 diabetes mellitus with foot ulcer Non-pressure chronic ulcer of other part of left foot with fat layer exposed Essential (primary) hypertension Type 2 diabetes mellitus with diabetic autonomic (poly)neuropathy Procedures Wound #1R Pre-procedure diagnosis of Wound #1R is a Diabetic Wound/Ulcer of the Lower Extremity located on the Left Toe Great .Severity of Tissue Pre Debridement is: Fat layer exposed. There was a Excisional Skin/Subcutaneous Tissue Debridement with a total area of 0.16 sq cm performed by STONE III, Kemarion Abbey E., PA-C. With the following instrument(s): Curette to remove Viable and Non-Viable tissue/material. Material removed includes Callus, Subcutaneous Tissue, Slough, Skin: Dermis, and Skin: Epidermis after achieving pain control using Lidocaine 4% Topical Solution. No specimens were taken. A time out was conducted at 12:54, prior to the start of the procedure. A Minimum amount of bleeding was controlled with Pressure. The procedure was tolerated well with a pain level of 0 throughout and a pain level of 0 following the procedure. Post Debridement Measurements: 0.6cm length x 0.6cm width x 0.3cm depth; 0.085cm^3 volume. Character of Wound/Ulcer Post Debridement is improved. Severity of Tissue Post Debridement is: Fat layer exposed. Post procedure Diagnosis Wound #1R: Same as  Pre-Procedure [name], [name] ([id number]) Pre-procedure diagnosis of Wound #1R is a Diabetic Wound/Ulcer of the Lower Extremity located on the Left Toe Great . There was a Total Contact Cast Procedure by STONE III, Kyrus Hyde E., PA-C. Post procedure Diagnosis Wound #1R: Same as Pre-Procedure Plan Wound Cleansing: Wound #1R Left Toe Great: Clean wound with Normal Saline. May shower with protection. - Do not get the cast wet Primary Wound Dressing: Wound #1R Left Toe Great: Silver Collagen Secondary Dressing: Wound #1R Left Toe Great: Foam Dressing Change Frequency: Wound #1R Left Toe Great: Change dressing every week Follow-up Appointments: Wound #1R Left Toe Great: Return Appointment in 1 week. Off-Loading: Wound #1R Left Toe Great: Total Contact Cast to Left Lower Extremity At this time I did recommend that we go ahead and reinitiate the total contact cast he's in agreement that plan. We will subsequently see were things that at follow-up. If anything changes worsens meantime he will contact the office and let me know. Please see above for specific wound care orders. We will see patient for re-evaluation in 1 week(s) here in the clinic. If anything worsens or changes patient will contact our office for additional recommendations. Electronic Signature(s) Signed: 06/12/2019 12:02:24 PM By: Worthy Keeler PA-C Entered By: Worthy Keeler  on 06/08/2019 23:07:32 [name], [name] ([id number]) -------------------------------------------------------------------------------- ROS/PFSH Details Patient Name: [name], [name]. Date of Service: 06/08/2019 12:30 PM Medical Record Number: [id number] Patient Account Number: [id number] Date of Birth/Sex: Mar 04, 1946 (73 y.o. M) Treating RN: Montey Hora Primary Care Provider: Glendon Axe Other Clinician: Referring Provider: Glendon Axe Treating Provider/Extender: STONE III, Rashi Giuliani Weeks in Treatment: 25 Information  Obtained From Patient Constitutional Symptoms (General Health) Complaints and Symptoms: Negative for: Fatigue; Fever; Chills; Marked Weight Change Respiratory Complaints and Symptoms: Negative for: Chronic or frequent coughs; Shortness of Breath Medical History: Negative for: Aspiration; Asthma; Chronic Obstructive Pulmonary Disease (COPD); Pneumothorax; Sleep Apnea; Tuberculosis Cardiovascular Complaints and Symptoms: Negative for: Chest pain; LE edema Medical History: Positive for: Hypertension Negative for: Angina; Arrhythmia; Congestive Heart Failure; Coronary Artery Disease; Deep Vein Thrombosis; Hypotension; Myocardial Infarction; Peripheral Arterial Disease; Peripheral Venous Disease; Phlebitis; Vasculitis Psychiatric Complaints and Symptoms: Negative for: Anxiety; Claustrophobia Eyes Medical History: Negative for: Cataracts; Glaucoma; Optic Neuritis Ear/Nose/Mouth/Throat Medical History: Negative for: Chronic sinus problems/congestion; Middle ear problems Hematologic/Lymphatic Medical History: Negative for: Anemia; Hemophilia; Human Immunodeficiency Virus; Lymphedema Gastrointestinal [name], HUDGINS ([id number]) Medical History: Negative for: Cirrhosis ; Colitis; Crohnos; Hepatitis A; Hepatitis B; Hepatitis C Endocrine Medical History: Positive for: Type II Diabetes Negative for: Type I Diabetes Time with diabetes: 1990 Treated with: Insulin Blood sugar tested every day: Yes Tested : Genitourinary Medical History: Negative for: End Stage Renal Disease Immunological Medical History: Negative for: Lupus Erythematosus; Raynaudos; Scleroderma Integumentary (Skin) Medical History: Negative for: History of Burn; History of pressure wounds Musculoskeletal Medical History: Negative for: Gout; Rheumatoid Arthritis; Osteoarthritis; Osteomyelitis Neurologic Medical History: Negative for: Dementia; Neuropathy; Quadriplegia; Paraplegia; Seizure  Disorder Oncologic Medical History: Negative for: Received Chemotherapy; Received Radiation Immunizations Pneumococcal Vaccine: Received Pneumococcal Vaccination: Yes Implantable Devices No devices added Family and Social History Cancer: No; Diabetes: Yes - Mother; Heart Disease: Yes - Father; Hereditary Spherocytosis: No; Hypertension: No; Kidney Disease: No; Lung Disease: No; Seizures: No; Stroke: Yes - Father; Thyroid Problems: No; Tuberculosis: No; Never smoker; Marital Status - Widowed; Alcohol Use: Never; Drug Use: No History; Caffeine Use: Moderate; Financial Concerns: No; Food, Clothing or Shelter Needs: No; Support System Lacking: No; Transportation Concerns: No Physician Affirmation I have reviewed and agree with the above information. [name], [name] ([id number]) Electronic Signature(s) Signed: 06/09/2019 5:04:52 PM By: Montey Hora Signed: 06/12/2019 12:02:24 PM By: Worthy Keeler PA-C Entered By: Worthy Keeler on 06/08/2019 23:06:58 [name] ([id number]) -------------------------------------------------------------------------------- Total Contact Cast Details Patient Name: [name], [name] Date of Service: 06/08/2019 12:30 PM Medical Record Number: [id number] Patient Account Number: [id number] Date of Birth/Sex: 1946-01-17 (73 y.o. M) Treating RN: Montey Hora Primary Care Provider: Glendon Axe Other Clinician: Referring Provider: Glendon Axe Treating Provider/Extender: STONE III, Susann Lawhorne Weeks in Treatment: 25 Total Contact Cast Applied for Wound Assessment: Wound #1R Left Toe Great Performed By: Physician Emilio Math., PA-C Post Procedure Diagnosis Same as Pre-procedure Electronic Signature(s) Signed: 06/08/2019 3:09:24 PM By: Montey Hora Signed: 06/12/2019 12:02:24 PM By: Worthy Keeler PA-C Entered By: Montey Hora on 06/08/2019 13:00:00 [name]  ([id number]) -------------------------------------------------------------------------------- SuperBill Details Patient Name: [name]. Date of Service: 06/08/2019 Medical Record Number: [id number] Patient Account Number: [id number] Date of Birth/Sex: [birth year] (73 y.o. M) Treating RN: Montey Hora Primary Care Provider: Glendon Axe Other Clinician: Referring Provider: Glendon Axe Treating Provider/Extender: STONE III, Nazire Fruth Weeks in Treatment: 25 Diagnosis Coding ICD-10 Codes Code Description E11.621 Type 2 diabetes mellitus with foot ulcer L97.522 Non-pressure chronic  ulcer of other part of left foot with fat layer exposed [employer] (primary) hypertension E11.43 Type 2 diabetes mellitus with diabetic autonomic (poly)neuropathy Facility Procedures CPT4 Code: [id number] Description: 76226 - DEB SUBQ TISSUE 20 SQ CM/< ICD-10 Diagnosis Description L97.522 Non-pressure chronic ulcer of other part of left foot with fat Modifier: layer exposed Quantity: 1 Physician Procedures CPT4 Code: [id number] Description: 25638 - WC PHYS SUBQ TISS 20 SQ CM ICD-10 Diagnosis Description L97.522 Non-pressure chronic ulcer of other part of left foot with fat Modifier: layer exposed Quantity: 1 Electronic Signature(s) Signed: 06/12/2019 12:02:24 PM By: Worthy Keeler PA-C Entered By: Worthy Keeler on 06/08/2019 23:07:42

## 2019-06-15 ENCOUNTER — Other Ambulatory Visit: Payer: Self-pay

## 2019-06-15 ENCOUNTER — Encounter: Payer: Medicare Other | Admitting: Physician Assistant

## 2019-06-15 DIAGNOSIS — E11621 Type 2 diabetes mellitus with foot ulcer: Secondary | ICD-10-CM | POA: Diagnosis not present

## 2019-06-16 NOTE — Progress Notes (Signed)
[name], [name] ([id number]) Visit Report for 06/15/2019 Arrival Information Details Patient Name: [name], [name]. Date of Service: 06/15/2019 1:45 PM Medical Record Number: [id number] Patient Account Number: [id number] Date of Birth/Sex: 02-23-1946 (73 y.o. M) Treating RN: Cornell Barman Primary Care Vega Stare: Glendon Axe Other Clinician: Referring Everli Rother: Glendon Axe Treating Daquavion Catala/Extender: Melburn Hake, HOYT Weeks in Treatment: 26 Visit Information History Since Last Visit Added or deleted any medications: No Patient Arrived: Ambulatory Any new allergies or adverse reactions: No Arrival Time: 13:45 Had a fall or experienced change in No Accompanied By: self activities of daily living that may affect Transfer Assistance: None risk of falls: Patient Has Alerts: Yes Signs or symptoms of abuse/neglect since last visito No Patient Alerts: DMII Hospitalized since last visit: No Implantable device outside of the clinic excluding No cellular tissue based products placed in the center since last visit: Has Dressing in Place as Prescribed: Yes Pain Present Now: No Electronic Signature(s) Signed: 06/15/2019 4:58:07 PM By: Gretta Cool, BSN, RN, CWS, Kim RN, BSN Entered By: Gretta Cool, BSN, RN, CWS, Kim on 06/15/2019 [place], [name] ([id number]) -------------------------------------------------------------------------------- Clinic Level of Care Assessment Details Patient Name: [name]. Date of Service: 06/15/2019 1:45 PM Medical Record Number: [id number] Patient Account Number: [id number] Date of Birth/Sex: 07-17-1946 (73 y.o. M) Treating RN: Army Melia Primary Care Nysia Dell: Glendon Axe Other Clinician: Referring Matther Labell: Glendon Axe Treating Isaiyah Feldhaus/Extender: STONE III, HOYT Weeks in Treatment: 26 Clinic Level of Care Assessment Items TOOL 4 Quantity Score []  - Use when only an EandM is performed on FOLLOW-UP visit 0 ASSESSMENTS -  Nursing Assessment / Reassessment X - Reassessment of Co-morbidities (includes updates in patient status) 1 10 X- 1 5 Reassessment of Adherence to Treatment Plan ASSESSMENTS - Wound and Skin Assessment / Reassessment X - Simple Wound Assessment / Reassessment - one wound 1 5 []  - 0 Complex Wound Assessment / Reassessment - multiple wounds []  - 0 Dermatologic / Skin Assessment (not related to wound area) ASSESSMENTS - Focused Assessment []  - Circumferential Edema Measurements - multi extremities 0 []  - 0 Nutritional Assessment / Counseling / Intervention []  - 0 Lower Extremity Assessment (monofilament, tuning fork, pulses) []  - 0 Peripheral Arterial Disease Assessment (using hand held doppler) ASSESSMENTS - Ostomy and/or Continence Assessment and Care []  - Incontinence Assessment and Management 0 []  - 0 Ostomy Care Assessment and Management (repouching, etc.) PROCESS - Coordination of Care X - Simple Patient / Family Education for ongoing care 1 15 []  - 0 Complex (extensive) Patient / Family Education for ongoing care []  - 0 Staff obtains Programmer, systems, Records, Test Results / Process Orders []  - 0 Staff telephones HHA, Nursing Homes / Clarify orders / etc []  - 0 Routine Transfer to another Facility (non-emergent condition) []  - 0 Routine Hospital Admission (non-emergent condition) []  - 0 New Admissions / Biomedical engineer / Ordering NPWT, Apligraf, etc. []  - 0 Emergency Hospital Admission (emergent condition) X- 1 10 Simple Discharge Coordination [name], [name] ([id number]) []  - 0 Complex (extensive) Discharge Coordination PROCESS - Special Needs []  - Pediatric / Minor Patient Management 0 []  - 0 Isolation Patient Management []  - 0 Hearing / Language / Visual special needs []  - 0 Assessment of Community assistance (transportation, D/C planning, etc.) []  - 0 Additional assistance / Altered mentation []  - 0 Support Surface(s) Assessment (bed, cushion,  seat, etc.) INTERVENTIONS - Wound Cleansing / Measurement X - Simple Wound Cleansing - one wound 1 5 []  - 0 Complex Wound Cleansing - multiple  wounds X- 1 5 Wound Imaging (photographs - any number of wounds) []  - 0 Wound Tracing (instead of photographs) X- 1 5 Simple Wound Measurement - one wound []  - 0 Complex Wound Measurement - multiple wounds INTERVENTIONS - Wound Dressings X - Small Wound Dressing one or multiple wounds 1 10 []  - 0 Medium Wound Dressing one or multiple wounds []  - 0 Large Wound Dressing one or multiple wounds []  - 0 Application of Medications - topical []  - 0 Application of Medications - injection INTERVENTIONS - Miscellaneous []  - External ear exam 0 []  - 0 Specimen Collection (cultures, biopsies, blood, body fluids, etc.) []  - 0 Specimen(s) / Culture(s) sent or taken to Lab for analysis []  - 0 Patient Transfer (multiple staff / Civil Service fast streamer / Similar devices) []  - 0 Simple Staple / Suture removal (25 or less) []  - 0 Complex Staple / Suture removal (26 or more) []  - 0 Hypo / Hyperglycemic Management (close monitor of Blood Glucose) []  - 0 Ankle / Brachial Index (ABI) - do not check if billed separately X- 1 5 Vital Signs [name], [name] ([id number]) Has the patient been seen at the hospital within the last three years: Yes Total Score: 75 Level Of Care: New/Established - Level 2 Electronic Signature(s) Signed: 06/15/2019 3:38:22 PM By: Army Melia Entered By: Army Melia on 06/15/2019 14:16:39 [place], [name] ([id number]) -------------------------------------------------------------------------------- Encounter Discharge Information Details Patient Name: [name]. Date of Service: 06/15/2019 1:45 PM Medical Record Number: [id number] Patient Account Number: [id number] Date of Birth/Sex: Dec 09, 1946 (73 y.o. M) Treating RN: Army Melia Primary Care Ashana Tullo: Glendon Axe Other Clinician: Referring Jameel Quant: Glendon Axe Treating Cortlyn Cannell/Extender: STONE III, HOYT Weeks in Treatment: 26 Encounter Discharge Information Items Discharge Condition: Stable Ambulatory Status: Cane Discharge Destination: Home Transportation: Private Auto Accompanied By: self Schedule Follow-up Appointment: Yes Clinical Summary of Care: Electronic Signature(s) Signed: 06/15/2019 3:25:23 PM By: Army Melia Entered By: Army Melia on 06/15/2019 15:25:23 [name] ([id number]) -------------------------------------------------------------------------------- Lower Extremity Assessment Details Patient Name: [name]. Date of Service: 06/15/2019 1:45 PM Medical Record Number: [id number] Patient Account Number: [id number] Date of Birth/Sex: May 02, 1946 (73 y.o. M) Treating RN: Cornell Barman Primary Care Jimmy Plessinger: Glendon Axe Other Clinician: Referring Emmaclaire Switala: Glendon Axe Treating Chanette Demo/Extender: STONE III, HOYT Weeks in Treatment: 26 Edema Assessment Assessed: [Left: No] [Right: No] Edema: [Left: N] [Right: o] Vascular Assessment Pulses: Dorsalis Pedis Palpable: [Left:Yes] Electronic Signature(s) Signed: 06/15/2019 4:58:07 PM By: Gretta Cool, BSN, RN, CWS, Kim RN, BSN Entered By: Gretta Cool, BSN, RN, CWS, Kim on 06/15/2019 13:56:32 [place], [name] ([id number]) -------------------------------------------------------------------------------- Multi Wound Chart Details Patient Name: [name]. Date of Service: 06/15/2019 1:45 PM Medical Record Number: [id number] Patient Account Number: [id number] Date of Birth/Sex: 02/14/1946 (73 y.o. M) Treating RN: Army Melia Primary Care Cecilia Vancleve: Glendon Axe Other Clinician: Referring Lashaundra Lehrmann: Glendon Axe Treating Hopie Pellegrin/Extender: STONE III, HOYT Weeks in Treatment: 26 Vital Signs Height(in): 72 Pulse(bpm): 75 Weight(lbs): 250 Blood Pressure(mmHg): 151/75 Body Mass Index(BMI): 34 Temperature(F): 98.3 Respiratory  Rate 16 (breaths/min): Photos: [N/A:N/A] Wound Location: Left Toe Great N/A N/A Wounding Event: Gradually Appeared N/A N/A Primary Etiology: Diabetic Wound/Ulcer of the N/A N/A Lower Extremity Comorbid History: Hypertension, Type II Diabetes N/A N/A Date Acquired: 11/30/2017 N/A N/A Weeks of Treatment: 26 N/A N/A Wound Status: Open N/A N/A Wound Recurrence: Yes N/A N/A Measurements L x W x D 0.4x0.6x0.2 N/A N/A (cm) Area (cm) : 0.188 N/A N/A Volume (cm) : 0.038 N/A N/A % Reduction in Area: 70.40%  N/A N/A % Reduction in Volume: 40.60% N/A N/A Classification: Grade 2 N/A N/A Exudate Amount: Medium N/A N/A Exudate Type: Serosanguineous N/A N/A Exudate Color: red, brown N/A N/A Wound Margin: Thickened N/A N/A Granulation Amount: Medium (34-66%) N/A N/A Granulation Quality: Pale, Hyper-granulation N/A N/A Necrotic Amount: None Present (0%) N/A N/A Exposed Structures: Fat Layer (Subcutaneous N/A N/A Tissue) Exposed: Yes Fascia: No Tendon: No Muscle: No [name], [name] ([id number]) Joint: No Bone: No Epithelialization: Small (1-33%) N/A N/A Assessment Notes: Small callus surrounding N/A N/A wound. Treatment Notes Electronic Signature(s) Signed: 06/15/2019 3:38:22 PM By: Army Melia Entered By: Army Melia on 06/15/2019 14:14:16 [place], [name] ([id number]) -------------------------------------------------------------------------------- [place] Details Patient Name: [name], [name]. Date of Service: 06/15/2019 1:45 PM Medical Record Number: [id number] Patient Account Number: [id number] Date of Birth/Sex: 1946-12-19 (73 y.o. M) Treating RN: Army Melia Primary Care Cambrey Lupi: Glendon Axe Other Clinician: Referring Morad Tal: Glendon Axe Treating Tyleek Smick/Extender: STONE III, HOYT Weeks in Treatment: 26 Active Inactive Abuse / Safety / Falls / Self Care Management Nursing Diagnoses: Potential for falls Goals: Patient  will remain injury free related to falls Date Initiated: 01/19/2019 [place] Resolution Date: 07/29/2019 Goal Status: Active Interventions: Assess fall risk on admission and as needed Notes: Orientation to the Wound Care Program Nursing Diagnoses: Knowledge deficit related to the wound healing center program Goals: Patient/caregiver will verbalize understanding of the [facility] Program Date Initiated: 01/19/2019 [place] Resolution Date: 08/11/2019 Goal Status: Active Interventions: Provide education on orientation to the wound center Notes: Peripheral Neuropathy Nursing Diagnoses: Knowledge deficit related to disease process and management of peripheral neurovascular dysfunction Goals: Patient/caregiver will verbalize understanding of disease process and disease management Date Initiated: 03/17/2019 [place] Resolution Date: 08/26/2019 Goal Status: Active Interventions: Assess signs and symptoms of neuropathy upon admission and as needed [name], [name] ([id number]) Provide education on Management of Neuropathy and Related Ulcers Notes: Wound/Skin Impairment Nursing Diagnoses: Impaired tissue integrity Goals: Ulcer/skin breakdown will have a volume reduction of 30% by week 4 Date Initiated: 12/12/2018 [place] Resolution Date: 08/26/2019 Goal Status: Active Interventions: Assess patient/caregiver ability to obtain necessary supplies Assess patient/caregiver ability to perform ulcer/skin care regimen upon admission and as needed Notes: Electronic Signature(s) Signed: 06/15/2019 3:38:22 PM By: Army Melia Entered By: Army Melia on 06/15/2019 14:14:07 [name] ([id number]) -------------------------------------------------------------------------------- Pain Assessment Details Patient Name: [name]. Date of Service: 06/15/2019 1:45 PM Medical Record Number: [id number] Patient Account Number: [id number] Date of Birth/Sex: 19-Jul-1946 (73  y.o. M) Treating RN: Cornell Barman Primary Care Surya Folden: Glendon Axe Other Clinician: Referring Everly Rubalcava: Glendon Axe Treating Tatem Holsonback/Extender: STONE III, HOYT Weeks in Treatment: 26 Active Problems Location of Pain Severity and Description of Pain Patient Has Paino Yes Site Locations Pain Location: Generalized Pain Rate the pain. Current Pain Level: 4 Pain Management and Medication Current Pain Management: Electronic Signature(s) Signed: 06/15/2019 4:58:07 PM By: Gretta Cool, BSN, RN, CWS, Kim RN, BSN Entered By: Gretta Cool, BSN, RN, CWS, Kim on 06/15/2019 13:46:31 [place], [name] ([id number]) -------------------------------------------------------------------------------- Patient/Caregiver Education Details Patient Name: [name] Date of Service: 06/15/2019 1:45 PM Medical Record Number: [id number] Patient Account Number: [id number] Date of Birth/Gender: [birth year] (73 y.o. M) Treating RN: Army Melia Primary Care Physician: Glendon Axe Other Clinician: Referring Physician: Glendon Axe Treating Physician/Extender: Sharalyn Ink in Treatment: 26 Education Assessment Education Provided To: Patient Education Topics Provided Wound/Skin Impairment: Handouts: Caring for Your Ulcer Methods: Demonstration, Explain/Verbal Responses: State content correctly Electronic Signature(s) Signed: 06/15/2019 3:38:22 PM By: Army Melia  Entered By: Army Melia on 06/15/2019 14:17:29 [name] ([id number]) -------------------------------------------------------------------------------- Wound Assessment Details Patient Name: [name], [name]. Date of Service: 06/15/2019 1:45 PM Medical Record Number: [id number] Patient Account Number: [id number] Date of Birth/Sex: [birth year] (73 y.o. M) Treating RN: Cornell Barman Primary Care Anaiya Wisinski: Glendon Axe Other Clinician: Referring Abbigail Anstey: Glendon Axe Treating Jimmylee Ratterree/Extender: STONE III,  HOYT Weeks in Treatment: 26 Wound Status Wound Number: 1R Primary Etiology: Diabetic Wound/Ulcer of the Lower Extremity Wound Location: Left Toe Great Wound Status: Open Wounding Event: Gradually Appeared Comorbid Hypertension, Type II Diabetes Date Acquired: 11/30/2017 History: Weeks Of Treatment: 26 Clustered Wound: No Photos Wound Measurements Length: (cm) 0.4 Width: (cm) 0.6 Depth: (cm) 0.2 Area: (cm) 0.188 Volume: (cm) 0.038 % Reduction in Area: 70.4% % Reduction in Volume: 40.6% Epithelialization: Small (1-33%) Tunneling: No Undermining: No Wound Description Classification: Grade 2 Foul Odor A Wound Margin: Thickened Slough/Fibr Exudate Amount: Medium Exudate Type: Serosanguineous Exudate Color: red, brown fter Cleansing: No ino No Wound Bed Granulation Amount: Medium (34-66%) Exposed Structure Granulation Quality: Pale, Hyper-granulation Fascia Exposed: No Necrotic Amount: None Present (0%) Fat Layer (Subcutaneous Tissue) Exposed: Yes Tendon Exposed: No Muscle Exposed: No Joint Exposed: No Bone Exposed: No Assessment Notes Small callus surrounding wound. [name], [name] ([id number]) Treatment Notes Wound #1R (Left Toe Great) Notes prisma, foam, TCC applied in clinic today Electronic Signature(s) Signed: 06/15/2019 4:58:07 PM By: Gretta Cool, BSN, RN, CWS, Kim RN, BSN Entered By: Gretta Cool, BSN, RN, CWS, Kim on 06/15/2019 13:56:13 [place], [name] ([id number]) -------------------------------------------------------------------------------- Vitals Details Patient Name: [name] Date of Service: 06/15/2019 1:45 PM Medical Record Number: [id number] Patient Account Number: [id number] Date of Birth/Sex: 1946-06-02 (73 y.o. M) Treating RN: Cornell Barman Primary Care Lesleyann Fichter: Glendon Axe Other Clinician: Referring Ashlley Booher: Glendon Axe Treating Ulonda Klosowski/Extender: STONE III, HOYT Weeks in Treatment: 26 Vital Signs Time Taken:  13:46 Temperature (F): 98.3 Height (in): 72 Pulse (bpm): 75 Weight (lbs): 250 Respiratory Rate (breaths/min): 16 Body Mass Index (BMI): 33.9 Blood Pressure (mmHg): 151/75 Reference Range: 80 - 120 mg / dl Electronic Signature(s) Signed: 06/15/2019 4:58:07 PM By: Gretta Cool, BSN, RN, CWS, Kim RN, BSN Entered By: Gretta Cool, BSN, RN, CWS, Kim on 06/15/2019 13:46:57

## 2019-06-16 NOTE — Progress Notes (Addendum)
PINCHOS, TOPEL ([id number]) Visit Report for 06/15/2019 Chief Complaint Document Details Patient Name: [name], [name]. Date of Service: 06/15/2019 1:45 PM Medical Record Number: [id number] Patient Account Number: [id number] Date of Birth/Sex: 1946/08/13 (73 y.o. M) Treating RN: Army Melia Primary Care Provider: Glendon Axe Other Clinician: Referring Provider: Glendon Axe Treating Provider/Extender: STONE III, HOYT Weeks in Treatment: 26 Information Obtained from: Patient Chief Complaint Left great toe ulcer Electronic Signature(s) Signed: 06/16/2019 1:16:38 PM By: Worthy Keeler PA-C Entered By: Worthy Keeler on 06/15/2019 13:43:25 C[place]ay, [name] ([id number]) -------------------------------------------------------------------------------- HPI Details Patient Name: [name] Date of Service: 06/15/2019 1:45 PM Medical Record Number: [id number] Patient Account Number: [id number] Date of Birth/Sex: June 08, 1946 (73 y.o. M) Treating RN: Army Melia Primary Care Provider: Glendon Axe Other Clinician: Referring Provider: Glendon Axe Treating Provider/Extender: Melburn Hake, HOYT Weeks in Treatment: 26 History of Present Illness HPI Description: 12/12/18 on evaluation today patient presents as a referral from his endocrinologist regarding issues that he has been having with his left great toe. Subsequently he has been seeing Dr. Cleda Mccreedy and Dr. Cleda Mccreedy has been the breeding the wound and in fact this was debrided this morning. The patient had an appointment there prior to coming here. Subsequently he states that even though he's been going for regular debridement after office things really do not seem to be showing signs of improvement unfortunately. He states that he is having some discomfort but nothing too significant. No fevers, chills, nausea, or vomiting noted at this time. The patient does have a history of hypertension, neuropathy, diabetes  mellitus type II, and a history of stroke. Currently he's been using bacitracin on the toe and utilizing a Band-Aid. He tells me he's had this wound for two years. He cannot remember the last time he had an x-ray. 12/19/18 on evaluation today patient appears to be doing well in regard to his plantar toe ulcer. He's been tolerating the dressing changes without complication. Fortunately there does not appear to be signs of infection at this time which is good news. No fever chills noted. 01/19/19 has been one month since I last saw the patient as he was out of town. With that being said on evaluation today it does appear that he is going to require sharp debridement and I do believe that the total contact cast would benefit him as far as being applied at this time. He is in agreement that plan. 01/23/19 on evaluation today patient appears to be doing better in regard to his left great toe ulcer. He has been shown signs of improvement week by week and although this is slow it does seem to be doing fairly well which is good news. Overall very pleased in this regard. 01/31/19 on evaluation today patient presents for follow-up concerning his great toe ulcer. He was actually supposed to be here yesterday and he did come however he ended up having to leave due to his blood sugar dropping he states he just did not eat enough lunch. Fortunately seems to be doing much better today which is excellent news. Fortunately there's no evidence of infection at this time which is also good news. No fevers, chills, nausea, or vomiting noted at this time. Overall I feel like he is making excellent progress. 02/06/19 on evaluation today patient actually appears to be doing very well in regard to his plantar toe ulcer. He's been tolerating the dressing changes without complication. Fortunately there is no sign of infection at this time. Overall been  very pleased with how things seem to be progressing. No fevers, chills,  nausea, or vomiting noted at this time. 02/13/19 on evaluation today patient actually appears to be doing excellent in regard to his toe ulcer which is almost completely close. Overall very pleased with that. There does not appear to be any signs of infection which is good news. Upon close inspection it appears that he has just a very slight opening still noted at the central portion of the toe although this is minimal. 02/16/19 on evaluation today patient is seen for early follow-up due to the fact that he tells me while at home he actually got up to go answer the door without putting on the walking boot part of his cast and subsequently damaged the total contact cast. It therefore comes in to have this removed and potentially replaced. Fortunately other than it given him a little bit of pain its far as pushing in on some areas there doesn't appear to be any injury once the cast was removed. 03/13/19 on evaluation today patient unfortunately presents for follow-up concerning his great toe ulcer on the left. He was discharged on 02/16/19 with the wound healed at that point. He tells me this remain so for several weeks or least a couple weeks before reopening. Fortunately there does not appear to be any signs of infection at this time. Nonetheless for the past week at least he's had the wound reopened and states it is been trying to keep pressure off of this but he has not been using the offloading shoe we previously gave him. Fortunately there's no evidence of infection at this time. No fevers, chills, nausea, or vomiting noted at this time. KAULDER, ZAHNER ([id number]) 03/17/19 on evaluation today patient actually appears to be doing rather well in regard to his toe also. Fact this appears to be significantly smaller this week even compared to what I saw last week on evaluation. He seems to have done very well for the offloading shoe and overall I'm very pleased with the progress that is made.  It may be that he doesn't even need the cast to get this to heal if he offloads this appropriately. 03/24/19 on evaluation today patient actually appears to be doing well in regard to his plantar foot ulcer. He's been tolerating the dressing changes without complication the collagen does seem to be beneficial for him. With that being said he is gonna require some miles sharp debridement today to clear away some of the callous's around the edge of the wound as well as the minimal Slough noted on the surface of the wound. 03/31/19 on evaluation today patient appears to be doing well in regard to his plantar great toe ulcer. He has been tolerating the dressing changes without complication which is good news. Fortunately there does not appear to be any signs of active infection at this time. Overall very pleased with how things seem to be progressing. 04/11/19 on evaluation today patient's tools are appears to be doing in my pinion roughly about the same as were things that previous. Fortunately there does not appear to be signs of active infection at this time which is good news. With that being said he has not been experiencing any pain at this time which is good news there is also No fevers, chills, nausea, or vomiting noted at this time. 04/18/19 on evaluation today patient's wound actually appears to be doing fairly well today he did not even really have a lot of  callous buildup. We have been debating on whether or not to reinitiate total contact cast. With that being said he seems to be doing fairly well this point with offloading in my pinion and again I think we need to come up with a way to get this healed that will also be sustainable for keeping it close. Again we were able to close it with the cast previous but again he has to be able to keep it such. For that reason we are working on trying to get him diabetic shoes he's waiting for the [place] to get in touch with the local company that has to  measure him for these do not want to have a cast on at such a time as when they need to actually measure him. 04/25/19 on evaluation today patient appears to be doing well in regard to his plantar foot specifically great toe ulcer. He's been tolerating the dressing changes without complication. Fortunately there's no signs of active infection at this time. Overall I'm very pleased with how things appear currently. 05/02/19 on evaluation today patient appears to actually be doing better in regard to his plantar foot ulcer. He shown signs of good improvement which is excellent news. She states he can walk better with the cast on the can with the offloading shoe that he had on previous. Fortunately there's no signs of active infection at this time. No fevers, chills, nausea, or vomiting noted at this time. 05/09/19 on evaluation today patient actually appears to be doing excellent. In fact he appears to be completely healed based on evaluation at this time. Fortunately there's no signs of infection and is having no discomfort. 06/08/19 this is a follow-up visit although the patient has been healed for almost a month but not quite. He tells me that in the past several days he noted some blood coming from his toe and he thought he should come in at this checked out. Fortunately he's having a pain. He also did get his custom orthotics shoes. With that being said he still appears to have developed this ulceration there is something he tells me that the orthotic specialist stated they could do to the toes to try to help out in this regard although he did not know exactly what it was we may need to check with [facility]. 06/15/19 on evaluation today patient actually appears to be doing great in regard to his foot ulcer. The wound on his toes shown signs of excellent improvement which is great news. With that being said he is not completely close yet the cast has done wonders for him and he is definitely headed  in the correct direction. No fevers, chills, nausea, or vomiting noted at this time. Electronic Signature(s) Signed: 07/03/2019 10:21:35 AM By: Sharon Mt Signed: 07/04/2019 4:11:10 PM By: Worthy Keeler PA-C Previous Signature: 06/16/2019 1:16:38 PM Version By: Worthy Keeler PA-C Entered By: Sharon Mt on 07/03/2019 09:23:55 Se[place], [name] ([id number]) -------------------------------------------------------------------------------- Physical Exam Details Patient Name: [name] Date of Service: 06/15/2019 1:45 PM Medical Record Number: [id number] Patient Account Number: [id number] Date of Birth/Sex: 06-Sep-1946 (73 y.o. M) Treating RN: Army Melia Primary Care Provider: Glendon Axe Other Clinician: Referring Provider: Glendon Axe Treating Provider/Extender: STONE III, HOYT Weeks in Treatment: 78 Constitutional Well-nourished and well-hydrated in no acute distress. Respiratory normal breathing without difficulty. Psychiatric this patient is able to make decisions and demonstrates good insight into disease process. Alert and Oriented x 3. pleasant and cooperative. Notes Patient's  wound bed currently showed signs of good granulation and epithelialization the sharp debridement was necessary today which is excellent news. Overall he seems to be tolerating the cast without any complication. The only issue he has is that he says he felt like his heel was moving up and down over the past week with the cast stone that hasn't happened before. Nonetheless I am gonna see about making sure that it is nice and tight on his heel as far as cast placement is concerned. Electronic Signature(s) Signed: 06/16/2019 1:16:38 PM By: Worthy Keeler PA-C Entered By: Worthy Keeler on 06/16/2019 13:02:17 [place], [name] ([id number]) -------------------------------------------------------------------------------- Physician Orders Details Patient Name: [name] Date of Service: 06/15/2019 1:45 PM Medical Record Number: [id number] Patient Account Number: [id number] Date of Birth/Sex: November 25, 1946 (73 y.o. M) Treating RN: Army Melia Primary Care Provider: Glendon Axe Other Clinician: Referring Provider: Glendon Axe Treating Provider/Extender: Melburn Hake, HOYT Weeks in Treatment: 7 Verbal / Phone Orders: No Diagnosis Coding ICD-10 Coding Code Description E11.621 Type 2 diabetes mellitus with foot ulcer L97.522 Non-pressure chronic ulcer of other part of left foot with fat layer exposed I10 Essential (primary) hypertension E11.43 Type 2 diabetes mellitus with diabetic autonomic (poly)neuropathy Wound Cleansing Wound #1R Left Toe Great o Clean wound with Normal Saline. o May shower with protection. - Do not get the cast wet Primary Wound Dressing Wound #1R Left Toe Great o Silver Collagen Secondary Dressing Wound #1R Left Toe Great o Foam Dressing Change Frequency Wound #1R Left Toe Great o Change dressing every week Follow-up Appointments Wound #1R Left Toe Great o Return Appointment in 1 week. Off-Loading Wound #1R Left Toe Great o Total Contact Cast to Left Lower Extremity - extra foam adding at top of cast Electronic Signature(s) Signed: 06/15/2019 3:38:22 PM By: Army Melia Signed: 06/16/2019 1:16:38 PM By: Worthy Keeler PA-C Entered By: Army Melia on 06/15/2019 14:16:12 [place], [name] ([id number]) -------------------------------------------------------------------------------- Problem List Details Patient Name: GLENDEL, JAGGERS. Date of Service: 06/15/2019 1:45 PM Medical Record Number: [id number] Patient Account Number: [id number] Date of Birth/Sex: [birth year] (73 y.o. M) Treating RN: Army Melia Primary Care Provider: Glendon Axe Other Clinician: Referring Provider: Glendon Axe Treating Provider/Extender: Melburn Hake, HOYT Weeks in Treatment: 26 Active Problems ICD-10 Evaluated  Encounter Code Description Active Date Today Diagnosis E11.621 Type 2 diabetes mellitus with foot ulcer 12/12/2018 No Yes L97.522 Non-pressure chronic ulcer of other part of left foot with fat 12/12/2018 No Yes layer exposed I10 Essential (primary) hypertension 12/12/2018 No Yes E11.43 Type 2 diabetes mellitus with diabetic autonomic (poly) 12/12/2018 No Yes neuropathy Inactive Problems Resolved Problems Electronic Signature(s) Signed: 06/16/2019 1:16:38 PM By: Worthy Keeler PA-C Entered By: Worthy Keeler on 06/15/2019 13:43:18 [place], [name] ([id number]) -------------------------------------------------------------------------------- Progress Note Details Patient Name: [name] Date of Service: 06/15/2019 1:45 PM Medical Record Number: [id number] Patient Account Number: [id number] Date of Birth/Sex: 01-Sep-1946 (73 y.o. M) Treating RN: Army Melia Primary Care Provider: Glendon Axe Other Clinician: Referring Provider: Glendon Axe Treating Provider/Extender: Melburn Hake, HOYT Weeks in Treatment: 26 Subjective Chief Complaint Information obtained from Patient Left great toe ulcer History of Present Illness (HPI) 12/12/18 on evaluation today patient presents as a referral from his endocrinologist regarding issues that he has been having with his left great toe. Subsequently he has been seeing Dr. Cleda Mccreedy and Dr. Cleda Mccreedy has been the breeding the wound and in fact this was debrided this morning. The patient had an appointment there prior to coming  here. Subsequently he states that even though he's been going for regular debridement after office things really do not seem to be showing signs of improvement unfortunately. He states that he is having some discomfort but nothing too significant. No fevers, chills, nausea, or vomiting noted at this time. The patient does have a history of hypertension, neuropathy, diabetes mellitus type II, and a history of stroke.  Currently he's been using bacitracin on the toe and utilizing a Band-Aid. He tells me he's had this wound for two years. He cannot remember the last time he had an x-ray. 12/19/18 on evaluation today patient appears to be doing well in regard to his plantar toe ulcer. He's been tolerating the dressing changes without complication. Fortunately there does not appear to be signs of infection at this time which is good news. No fever chills noted. 01/19/19 has been one month since I last saw the patient as he was out of town. With that being said on evaluation today it does appear that he is going to require sharp debridement and I do believe that the total contact cast would benefit him as far as being applied at this time. He is in agreement that plan. 01/23/19 on evaluation today patient appears to be doing better in regard to his left great toe ulcer. He has been shown signs of improvement week by week and although this is slow it does seem to be doing fairly well which is good news. Overall very pleased in this regard. 01/31/19 on evaluation today patient presents for follow-up concerning his great toe ulcer. He was actually supposed to be here yesterday and he did come however he ended up having to leave due to his blood sugar dropping he states he just did not eat enough lunch. Fortunately seems to be doing much better today which is excellent news. Fortunately there's no evidence of infection at this time which is also good news. No fevers, chills, nausea, or vomiting noted at this time. Overall I feel like he is making excellent progress. 02/06/19 on evaluation today patient actually appears to be doing very well in regard to his plantar toe ulcer. He's been tolerating the dressing changes without complication. Fortunately there is no sign of infection at this time. Overall been very pleased with how things seem to be progressing. No fevers, chills, nausea, or vomiting noted at this time. 02/13/19  on evaluation today patient actually appears to be doing excellent in regard to his toe ulcer which is almost completely close. Overall very pleased with that. There does not appear to be any signs of infection which is good news. Upon close inspection it appears that he has just a very slight opening still noted at the central portion of the toe although this is minimal. 02/16/19 on evaluation today patient is seen for early follow-up due to the fact that he tells me while at home he actually got up to go answer the door without putting on the walking boot part of his cast and subsequently damaged the total contact cast. It therefore comes in to have this removed and potentially replaced. Fortunately other than it given him a little bit of pain its far as pushing in on some areas there doesn't appear to be any injury once the cast was removed. CAMRY, THEISS ([id number]) 03/13/19 on evaluation today patient unfortunately presents for follow-up concerning his great toe ulcer on the left. He was discharged on 02/16/19 with the wound healed at that point. He tells  me this remain so for several weeks or least a couple weeks before reopening. Fortunately there does not appear to be any signs of infection at this time. Nonetheless for the past week at least he's had the wound reopened and states it is been trying to keep pressure off of this but he has not been using the offloading shoe we previously gave him. Fortunately there's no evidence of infection at this time. No fevers, chills, nausea, or vomiting noted at this time. 03/17/19 on evaluation today patient actually appears to be doing rather well in regard to his toe also. Fact this appears to be significantly smaller this week even compared to what I saw last week on evaluation. He seems to have done very well for the offloading shoe and overall I'm very pleased with the progress that is made. It may be that he doesn't even need the cast  to get this to heal if he offloads this appropriately. 03/24/19 on evaluation today patient actually appears to be doing well in regard to his plantar foot ulcer. He's been tolerating the dressing changes without complication the collagen does seem to be beneficial for him. With that being said he is gonna require some miles sharp debridement today to clear away some of the callous's around the edge of the wound as well as the minimal Slough noted on the surface of the wound. 03/31/19 on evaluation today patient appears to be doing well in regard to his plantar great toe ulcer. He has been tolerating the dressing changes without complication which is good news. Fortunately there does not appear to be any signs of active infection at this time. Overall very pleased with how things seem to be progressing. 04/11/19 on evaluation today patient's tools are appears to be doing in my pinion roughly about the same as were things that previous. Fortunately there does not appear to be signs of active infection at this time which is good news. With that being said he has not been experiencing any pain at this time which is good news there is also No fevers, chills, nausea, or vomiting noted at this time. 04/18/19 on evaluation today patient's wound actually appears to be doing fairly well today he did not even really have a lot of callous buildup. We have been debating on whether or not to reinitiate total contact cast. With that being said he seems to be doing fairly well this point with offloading in my pinion and again I think we need to come up with a way to get this healed that will also be sustainable for keeping it close. Again we were able to close it with the cast previous but again he has to be able to keep it such. For that reason we are working on trying to get him diabetic shoes he's waiting for the [place] to get in touch with the local company that has to measure him for these do not want to have a cast  on at such a time as when they need to actually measure him. 04/25/19 on evaluation today patient appears to be doing well in regard to his plantar foot specifically great toe ulcer. He's been tolerating the dressing changes without complication. Fortunately there's no signs of active infection at this time. Overall I'm very pleased with how things appear currently. 05/02/19 on evaluation today patient appears to actually be doing better in regard to his plantar foot ulcer. He shown signs of good improvement which is excellent news. She states  he can walk better with the cast on the can with the offloading shoe that he had on previous. Fortunately there's no signs of active infection at this time. No fevers, chills, nausea, or vomiting noted at this time. 05/09/19 on evaluation today patient actually appears to be doing excellent. In fact he appears to be completely healed based on evaluation at this time. Fortunately there's no signs of infection and is having no discomfort. 06/08/19 this is a follow-up visit although the patient has been healed for almost a month but not quite. He tells me that in the past several days he noted some blood coming from his toe and he thought he should come in at this checked out. Fortunately he's having a pain. He also did get his custom orthotics shoes. With that being said he still appears to have developed this ulceration there is something he tells me that the orthotic specialist stated they could do to the toes to try to help out in this regard although he did not know exactly what it was we may need to [facility]. 06/15/19 on evaluation today patient actually appears to be doing great in regard to his foot ulcer. The wound on his toes shown signs of excellent improvement which is great news. With that being said he is not completely close yet the cast has done wonders for him and he is definitely headed in the correct direction. No fevers, chills,  nausea, or vomiting noted at this time. Patient History Information obtained from Patient. KENNEN, STAMMER ([id number]) Family History Diabetes - Mother, Heart Disease - Father, Stroke - Father, No family history of Cancer, Hereditary Spherocytosis, Hypertension, Kidney Disease, Lung Disease, Seizures, Thyroid Problems, Tuberculosis. Social History Never smoker, Marital Status - Widowed, Alcohol Use - Never, Drug Use - No History, Caffeine Use - Moderate. Medical History Eyes Denies history of Cataracts, Glaucoma, Optic Neuritis Ear/Nose/Mouth/Throat Denies history of Chronic sinus problems/congestion, Middle ear problems Hematologic/Lymphatic Denies history of Anemia, Hemophilia, Human Immunodeficiency Virus, Lymphedema Respiratory Denies history of Aspiration, Asthma, Chronic Obstructive Pulmonary Disease (COPD), Pneumothorax, Sleep Apnea, Tuberculosis Cardiovascular Patient has history of Hypertension Denies history of Angina, Arrhythmia, Congestive Heart Failure, Coronary Artery Disease, Deep Vein Thrombosis, Hypotension, Myocardial Infarction, Peripheral Arterial Disease, Peripheral Venous Disease, Phlebitis, Vasculitis Gastrointestinal Denies history of Cirrhosis , Colitis, Crohn s, Hepatitis A, Hepatitis B, Hepatitis C Endocrine Patient has history of Type II Diabetes Denies history of Type I Diabetes Genitourinary Denies history of End Stage Renal Disease Immunological Denies history of Lupus Erythematosus, Raynaud s, Scleroderma Integumentary (Skin) Denies history of History of Burn, History of pressure wounds Musculoskeletal Denies history of Gout, Rheumatoid Arthritis, Osteoarthritis, Osteomyelitis Neurologic Denies history of Dementia, Neuropathy, Quadriplegia, Paraplegia, Seizure Disorder Oncologic Denies history of Received Chemotherapy, Received Radiation Review of Systems (ROS) Constitutional Symptoms (General Health) Denies complaints or symptoms of  Fatigue, Fever, Chills, Marked Weight Change. Respiratory Denies complaints or symptoms of Chronic or frequent coughs, Shortness of Breath. Cardiovascular Denies complaints or symptoms of Chest pain, LE edema. Psychiatric Denies complaints or symptoms of Anxiety, Claustrophobia. KINTA, MARTIS ([id number]) Objective Constitutional Well-nourished and well-hydrated in no acute distress. Vitals Time Taken: 1:46 PM, Height: 72 in, Weight: 250 lbs, BMI: 33.9, Temperature: 98.3 F, Pulse: 75 bpm, Respiratory Rate: 16 breaths/min, Blood Pressure: 151/75 mmHg. Respiratory normal breathing without difficulty. Psychiatric this patient is able to make decisions and demonstrates good insight into disease process. Alert and Oriented x 3. pleasant and cooperative. General Notes: Patient's wound  bed currently showed signs of good granulation and epithelialization the sharp debridement was necessary today which is excellent news. Overall he seems to be tolerating the cast without any complication. The only issue he has is that he says he felt like his heel was moving up and down over the past week with the cast stone that hasn't happened before. Nonetheless I am gonna see about making sure that it is nice and tight on his heel as far as cast placement is concerned. Integumentary (Hair, Skin) Wound #1R status is Open. Original cause of wound was Gradually Appeared. The wound is located on the Left Toe Great. The wound measures 0.4cm length x 0.6cm width x 0.2cm depth; 0.188cm^2 area and 0.038cm^3 volume. There is Fat Layer (Subcutaneous Tissue) Exposed exposed. There is no tunneling or undermining noted. There is a medium amount of serosanguineous drainage noted. The wound margin is thickened. There is medium (34-66%) pale, hyper - granulation within the wound bed. There is no necrotic tissue within the wound bed. General Notes: Small callus surrounding wound. Assessment Active  Problems ICD-10 Type 2 diabetes mellitus with foot ulcer Non-pressure chronic ulcer of other part of left foot with fat layer exposed Essential (primary) hypertension Type 2 diabetes mellitus with diabetic autonomic (poly)neuropathy Procedures Wound #1R Pre-procedure diagnosis of Wound #1R is a Diabetic Wound/Ulcer of the Lower Extremity located on the Left Toe Great . There was a Total Contact Cast Procedure by STONE III, HOYT E., PA-C. Post procedure Diagnosis Wound #1R: Same as Pre-Procedure MEGHAN, TIEMANN ([id number]) Plan Wound Cleansing: Wound #1R Left Toe Great: Clean wound with Normal Saline. May shower with protection. - Do not get the cast wet Primary Wound Dressing: Wound #1R Left Toe Great: Silver Collagen Secondary Dressing: Wound #1R Left Toe Great: Foam Dressing Change Frequency: Wound #1R Left Toe Great: Change dressing every week Follow-up Appointments: Wound #1R Left Toe Great: Return Appointment in 1 week. Off-Loading: Wound #1R Left Toe Great: Total Contact Cast to Left Lower Extremity - extra foam adding at top of cast We can continue with the above wound care measures for the next week and the patient is in agreement with plan. We will subsequently see were things that at follow-up. If anything changes or worsens in the meantime he will contact the office and let me know. Please see above for specific wound care orders. We will see patient for re-evaluation in 1 week(s) here in the clinic. If anything worsens or changes patient will contact our office for additional recommendations. Electronic Signature(s) Signed: 07/13/2019 11:33:52 PM By: Worthy Keeler PA-C Previous Signature: 06/16/2019 1:16:38 PM Version By: Worthy Keeler PA-C Entered By: Worthy Keeler on 07/13/2019 23:09:33 [name] ([id number]) -------------------------------------------------------------------------------- ROS/PFSH Details Patient Name: [name] Date of Service: 06/15/2019 1:45 PM Medical Record Number: [id number] Patient Account Number: [id number] Date of Birth/Sex: January 17, 1946 (73 y.o. M) Treating RN: Army Melia Primary Care Provider: Glendon Axe Other Clinician: Referring Provider: Glendon Axe Treating Provider/Extender: STONE III, HOYT Weeks in Treatment: 26 Information Obtained From Patient Constitutional Symptoms (General Health) Complaints and Symptoms: Negative for: Fatigue; Fever; Chills; Marked Weight Change Respiratory Complaints and Symptoms: Negative for: Chronic or frequent coughs; Shortness of Breath Medical History: Negative for: Aspiration; Asthma; Chronic Obstructive Pulmonary Disease (COPD); Pneumothorax; Sleep Apnea; Tuberculosis Cardiovascular Complaints and Symptoms: Negative for: Chest pain; LE edema Medical History: Positive for: Hypertension Negative for: Angina; Arrhythmia; Congestive Heart Failure; Coronary Artery Disease; Deep Vein Thrombosis; Hypotension; Myocardial Infarction; Peripheral  Arterial Disease; Peripheral Venous Disease; Phlebitis; Vasculitis Psychiatric Complaints and Symptoms: Negative for: Anxiety; Claustrophobia Eyes Medical History: Negative for: Cataracts; Glaucoma; Optic Neuritis Ear/Nose/Mouth/Throat Medical History: Negative for: Chronic sinus problems/congestion; Middle ear problems Hematologic/Lymphatic Medical History: Negative for: Anemia; Hemophilia; Human Immunodeficiency Virus; Lymphedema Gastrointestinal KRIKOR, WILLET ([id number]) Medical History: Negative for: Cirrhosis ; Colitis; Crohnos; Hepatitis A; Hepatitis B; Hepatitis C Endocrine Medical History: Positive for: Type II Diabetes Negative for: Type I Diabetes Time with diabetes: 1990 Treated with: Insulin Blood sugar tested every day: Yes Tested : Genitourinary Medical History: Negative for: End Stage Renal Disease Immunological Medical History: Negative for: Lupus  Erythematosus; Raynaudos; Scleroderma Integumentary (Skin) Medical History: Negative for: History of Burn; History of pressure wounds Musculoskeletal Medical History: Negative for: Gout; Rheumatoid Arthritis; Osteoarthritis; Osteomyelitis Neurologic Medical History: Negative for: Dementia; Neuropathy; Quadriplegia; Paraplegia; Seizure Disorder Oncologic Medical History: Negative for: Received Chemotherapy; Received Radiation Immunizations Pneumococcal Vaccine: Received Pneumococcal Vaccination: Yes Implantable Devices No devices added Family and Social History Cancer: No; Diabetes: Yes - Mother; Heart Disease: Yes - Father; Hereditary Spherocytosis: No; Hypertension: No; Kidney Disease: No; Lung Disease: No; Seizures: No; Stroke: Yes - Father; Thyroid Problems: No; Tuberculosis: No; Never smoker; Marital Status - Widowed; Alcohol Use: Never; Drug Use: No History; Caffeine Use: Moderate; Financial Concerns: No; Food, Clothing or Shelter Needs: No; Support System Lacking: No; Transportation Concerns: No Physician Affirmation I have reviewed and agree with the above information. AKIL, HOOS ([id number]) Electronic Signature(s) Signed: 06/16/2019 1:16:38 PM By: Worthy Keeler PA-C Signed: 06/16/2019 4:22:16 PM By: Army Melia Entered By: Worthy Keeler on 06/16/2019 13:01:55 [place], [name] ([id number]) -------------------------------------------------------------------------------- Total Contact Cast Details Patient Name: IDEN, STRIPLING. Date of Service: 06/15/2019 1:45 PM Medical Record Number: [id number] Patient Account Number: [id number] Date of Birth/Sex: 1946-09-02 (73 y.o. M) Treating RN: Army Melia Primary Care Provider: Glendon Axe Other Clinician: Referring Provider: Glendon Axe Treating Provider/Extender: STONE III, HOYT Weeks in Treatment: 26 Total Contact Cast Applied for Wound Assessment: Wound #1R Left Toe Great Performed By:  Physician Emilio Math., PA-C Post Procedure Diagnosis Same as Pre-procedure Electronic Signature(s) Signed: 06/16/2019 1:16:38 PM By: Worthy Keeler PA-C Entered By: Worthy Keeler on 06/16/2019 13:03:17 [place], [name] ([id number]) -------------------------------------------------------------------------------- SuperBill Details Patient Name: [name]. Date of Service: 06/15/2019 Medical Record Number: [id number] Patient Account Number: [id number] Date of Birth/Sex: Jul 15, 1946 (73 y.o. M) Treating RN: Army Melia Primary Care Provider: Glendon Axe Other Clinician: Referring Provider: Glendon Axe Treating Provider/Extender: STONE III, HOYT Weeks in Treatment: 26 Diagnosis Coding ICD-10 Codes Code Description E11.621 Type 2 diabetes mellitus with foot ulcer L97.522 Non-pressure chronic ulcer of other part of left foot with fat layer exposed I10 Essential (primary) hypertension E11.43 Type 2 diabetes mellitus with diabetic autonomic (poly)neuropathy Facility Procedures CPT4 Code: 38101751 Description: [phone] - APPLY TOTAL CONTACT LEG CAST ICD-10 Diagnosis Description L97.522 Non-pressure chronic ulcer of other part of left foot with fat Modifier: layer exposed Quantity: 1 Physician Procedures CPT4 Code: [id number] Description: 35361 - WC PHYS APPLY TOTAL CONTACT CAST ICD-10 Diagnosis Description L97.522 Non-pressure chronic ulcer of other part of left foot with fat Modifier: layer exposed Quantity: 1 Electronic Signature(s) Signed: 06/16/2019 2:26:59 PM By: Army Melia Signed: 06/16/2019 5:06:58 PM By: Worthy Keeler PA-C Previous Signature: 06/16/2019 1:16:38 PM Version By: Worthy Keeler PA-C Entered By: Army Melia on 06/16/2019 14:26:59

## 2019-06-22 ENCOUNTER — Encounter: Payer: Medicare Other | Admitting: Physician Assistant

## 2019-06-22 ENCOUNTER — Other Ambulatory Visit: Payer: Self-pay

## 2019-06-22 DIAGNOSIS — E11621 Type 2 diabetes mellitus with foot ulcer: Secondary | ICD-10-CM | POA: Diagnosis not present

## 2019-06-25 NOTE — Progress Notes (Signed)
[name], [name] ([id number]) Visit Report for 06/22/2019 Chief Complaint Document Details Patient Name: [name], [name]. Date of Service: 06/22/2019 12:30 PM Medical Record Number: [id number] Patient Account Number: [id number] Date of Birth/Sex: December 10, 1946 (73 y.o. M) Treating RN: Army Melia Primary Care Provider: Glendon Axe Other Clinician: Referring Provider: Glendon Axe Treating Provider/Extender: STONE III, HOYT Weeks in Treatment: 27 Information Obtained from: Patient Chief Complaint Left great toe ulcer Electronic Signature(s) Signed: 06/25/2019 11:25:56 PM By: Worthy Keeler PA-C Entered By: Worthy Keeler on 06/22/2019 13:00:11 [place], [name] ([id number]) -------------------------------------------------------------------------------- HPI Details Patient Name: [name] Date of Service: 06/22/2019 12:30 PM Medical Record Number: [id number] Patient Account Number: [id number] Date of Birth/Sex: 1946-03-17 (73 y.o. M) Treating RN: Army Melia Primary Care Provider: Glendon Axe Other Clinician: Referring Provider: Glendon Axe Treating Provider/Extender: Melburn Hake, HOYT Weeks in Treatment: 27 History of Present Illness HPI Description: 12/12/18 on evaluation today patient presents as a referral from his endocrinologist regarding issues that he has been having with his left great toe. Subsequently he has been seeing Dr. Cleda Mccreedy and Dr. Cleda Mccreedy has been the breeding the wound and in fact this was debrided this morning. The patient had an appointment there prior to coming here. Subsequently he states that even though he's been going for regular debridement after office things really do not seem to be showing signs of improvement unfortunately. He states that he is having some discomfort but nothing too significant. No fevers, chills, nausea, or vomiting noted at this time. The patient does have a history of hypertension, neuropathy,  diabetes mellitus type II, and a history of stroke. Currently he's been using bacitracin on the toe and utilizing a Band-Aid. He tells me he's had this wound for two years. He cannot remember the last time he had an x-ray. 12/19/18 on evaluation today patient appears to be doing well in regard to his plantar toe ulcer. He's been tolerating the dressing changes without complication. Fortunately there does not appear to be signs of infection at this time which is good news. No fever chills noted. 01/19/19 has been one month since I last saw the patient as he was out of town. With that being said on evaluation today it does appear that he is going to require sharp debridement and I do believe that the total contact cast would benefit him as far as being applied at this time. He is in agreement that plan. 01/23/19 on evaluation today patient appears to be doing better in regard to his left great toe ulcer. He has been shown signs of improvement week by week and although this is slow it does seem to be doing fairly well which is good news. Overall very pleased in this regard. 01/31/19 on evaluation today patient presents for follow-up concerning his great toe ulcer. He was actually supposed to be here yesterday and he did come however he ended up having to leave due to his blood sugar dropping he states he just did not eat enough lunch. Fortunately seems to be doing much better today which is excellent news. Fortunately there's no evidence of infection at this time which is also good news. No fevers, chills, nausea, or vomiting noted at this time. Overall I feel like he is making excellent progress. 02/06/19 on evaluation today patient actually appears to be doing very well in regard to his plantar toe ulcer. He's been tolerating the dressing changes without complication. Fortunately there is no sign of infection at this time. Overall been  very pleased with how things seem to be progressing. No fevers,  chills, nausea, or vomiting noted at this time. 02/13/19 on evaluation today patient actually appears to be doing excellent in regard to his toe ulcer which is almost completely close. Overall very pleased with that. There does not appear to be any signs of infection which is good news. Upon close inspection it appears that he has just a very slight opening still noted at the central portion of the toe although this is minimal. 02/16/19 on evaluation today patient is seen for early follow-up due to the fact that he tells me while at home he actually got up to go answer the door without putting on the walking boot part of his cast and subsequently damaged the total contact cast. It therefore comes in to have this removed and potentially replaced. Fortunately other than it given him a little bit of pain its far as pushing in on some areas there doesn't appear to be any injury once the cast was removed. 03/13/19 on evaluation today patient unfortunately presents for follow-up concerning his great toe ulcer on the left. He was discharged on 02/16/19 with the wound healed at that point. He tells me this remain so for several weeks or least a couple weeks before reopening. Fortunately there does not appear to be any signs of infection at this time. Nonetheless for the past week at least he's had the wound reopened and states it is been trying to keep pressure off of this but he has not been using the offloading shoe we previously gave him. Fortunately there's no evidence of infection at this time. No fevers, chills, nausea, or vomiting noted at this time. [name], [name] ([id number]) 03/17/19 on evaluation today patient actually appears to be doing rather well in regard to his toe also. Fact this appears to be significantly smaller this week even compared to what I saw last week on evaluation. He seems to have done very well for the offloading shoe and overall I'm very pleased with the progress that is  made. It may be that he doesn't even need the cast to get this to heal if he offloads this appropriately. 03/24/19 on evaluation today patient actually appears to be doing well in regard to his plantar foot ulcer. He's been tolerating the dressing changes without complication the collagen does seem to be beneficial for him. With that being said he is gonna require some miles sharp debridement today to clear away some of the callous's around the edge of the wound as well as the minimal Slough noted on the surface of the wound. 03/31/19 on evaluation today patient appears to be doing well in regard to his plantar great toe ulcer. He has been tolerating the dressing changes without complication which is good news. Fortunately there does not appear to be any signs of active infection at this time. Overall very pleased with how things seem to be progressing. 04/11/19 on evaluation today patient's tools are appears to be doing in my pinion roughly about the same as were things that previous. Fortunately there does not appear to be signs of active infection at this time which is good news. With that being said he has not been experiencing any pain at this time which is good news there is also No fevers, chills, nausea, or vomiting noted at this time. 04/18/19 on evaluation today patient's wound actually appears to be doing fairly well today he did not even really have a lot of  callous buildup. We have been debating on whether or not to reinitiate total contact cast. With that being said he seems to be doing fairly well this point with offloading in my pinion and again I think we need to come up with a way to get this healed that will also be sustainable for keeping it close. Again we were able to close it with the cast previous but again he has to be able to keep it such. For that reason we are working on trying to get him diabetic shoes he's waiting for the [address] to get in touch with the local company that has  to measure him for these do not want to have a cast on at such a time as when they need to actually measure him. 04/25/19 on evaluation today patient appears to be doing well in regard to his plantar foot specifically great toe ulcer. He's been tolerating the dressing changes without complication. Fortunately there's no signs of active infection at this time. Overall I'm very pleased with how things appear currently. 05/02/19 on evaluation today patient appears to actually be doing better in regard to his plantar foot ulcer. He shown signs of good improvement which is excellent news. She states he can walk better with the cast on the can with the offloading shoe that he had on previous. Fortunately there's no signs of active infection at this time. No fevers, chills, nausea, or vomiting noted at this time. 05/09/19 on evaluation today patient actually appears to be doing excellent. In fact he appears to be completely healed based on evaluation at this time. Fortunately there's no signs of infection and is having no discomfort. 06/08/19 this is a follow-up visit although the patient has been healed for almost a month but not quite. He tells me that in the past several days he noted some blood coming from his toe and he thought he should come in at this checked out. Fortunately he's having a pain. He also did get his custom orthotics shoes. With that being said he still appears to have developed this ulceration there is something he tells me that the orthotic specialist stated they could do to the toes to try to help out in this regard although he did not know exactly what it was we may need to check with [facility]. 06/12/19 on evaluation today patient actually appears to be doing great in regard to his foot ulcer. The wound on his toes shown signs of excellent improvement which is great news. With that being said he is not completely close yet the cast has done wonders for him and he is definitely  headed in the correct direction. No fevers, chills, nausea, or vomiting noted at this time. 06/22/19 on evaluation today fortunately patient actually appears to be healing quite well and in fact appears to be completely healed this is excellent news. Fortunately there's no signs of active infection he's been tolerating the total contact cast without complication. Overall I'm pleased with the progress that has been made. He also has his new custom shoes he brought was with him today. Electronic Signature(s) Signed: 06/25/2019 11:25:56 PM By: Irean Hong [place], [place] ([id number]) Entered By: Worthy Keeler on 06/22/2019 13:46:15 [place], [name] ([id number]) -------------------------------------------------------------------------------- Physical Exam Details Patient Name: REVANTH, NEIDIG Date of Service: 06/22/2019 12:30 PM Medical Record Number: [id number] Patient Account Number: [id number] Date of Birth/Sex: 11-03-1946 (73 y.o. M) Treating RN: Army Melia Primary Care Provider: Glendon Axe Other Clinician:  Referring Provider: [facility] [place], JASMINE Treating Provider/Extender: STONE III, HOYT Weeks in Treatment: 85 Constitutional Well-nourished and well-hydrated in no acute distress. Respiratory normal breathing without difficulty. clear to auscultation bilaterally. Cardiovascular regular rate and rhythm with normal S1, S2. Psychiatric this patient is able to make decisions and demonstrates good insight into disease process. Alert and Oriented x 3. pleasant and cooperative. Notes On evaluation today patient's wound bed actually showed signs of excellent improvement. Fortunately there's no signs of active infection which is great news. He tolerated the cast and appears that the wound is completely covered over with new skin upon today's inspection. Electronic Signature(s) Signed: 06/25/2019 11:25:56 PM By: Worthy Keeler PA-C Entered By: Worthy Keeler on  06/22/2019 13:46:56 [place], [name] ([id number]) -------------------------------------------------------------------------------- Physician Orders Details Patient Name: [name] Date of Service: 06/22/2019 12:30 PM Medical Record Number: [id number] Patient Account Number: [id number] Date of Birth/Sex: 1946/04/22 (73 y.o. M) Treating RN: Army Melia Primary Care Provider: Glendon Axe Other Clinician: Referring Provider: Glendon Axe Treating Provider/Extender: Melburn Hake, HOYT Weeks in Treatment: 30 Verbal / Phone Orders: No Diagnosis Coding ICD-10 Coding Code Description E11.621 Type 2 diabetes mellitus with foot ulcer L97.522 Non-pressure chronic ulcer of other part of left foot with fat layer exposed I10 Essential (primary) hypertension E11.43 Type 2 diabetes mellitus with diabetic autonomic (poly)neuropathy Follow-up Appointments o Other: - Return Tuesday, June 30th Off-Loading o Other: - orthopedic felt at the prior wound site on the left great toe, pt will be wearing his custom shoes Electronic Signature(s) Signed: 06/22/2019 4:27:33 PM By: Army Melia Signed: 06/25/2019 11:25:56 PM By: Worthy Keeler PA-C Entered By: Army Melia on 06/22/2019 13:22:09 [name] ([id number]) -------------------------------------------------------------------------------- Problem List Details Patient Name: [name]. Date of Service: 06/22/2019 12:30 PM Medical Record Number: [id number] Patient Account Number: [id number] Date of Birth/Sex: 10-Sep-1946 (73 y.o. M) Treating RN: Army Melia Primary Care Provider: Glendon Axe Other Clinician: Referring Provider: Glendon Axe Treating Provider/Extender: Melburn Hake, HOYT Weeks in Treatment: 27 Active Problems ICD-10 Evaluated Encounter Code Description Active Date Today Diagnosis E11.621 Type 2 diabetes mellitus with foot ulcer 12/12/2018 No Yes L97.522 Non-pressure chronic ulcer of  other part of left foot with fat 12/12/2018 No Yes layer exposed I10 Essential (primary) hypertension 12/12/2018 No Yes E11.43 Type 2 diabetes mellitus with diabetic autonomic (poly) 12/12/2018 No Yes neuropathy Inactive Problems Resolved Problems Electronic Signature(s) Signed: 06/25/2019 11:25:56 PM By: Worthy Keeler PA-C Entered By: Worthy Keeler on 06/22/2019 13:00:05 [place], [name] ([id number]) -------------------------------------------------------------------------------- Progress Note Details Patient Name: [name] Date of Service: 06/22/2019 12:30 PM Medical Record Number: [id number] Patient Account Number: [id number] Date of Birth/Sex: Feb 06, 1946 (73 y.o. M) Treating RN: Army Melia Primary Care Provider: Glendon Axe Other Clinician: Referring Provider: Glendon Axe Treating Provider/Extender: Melburn Hake, HOYT Weeks in Treatment: 27 Subjective Chief Complaint Information obtained from Patient Left great toe ulcer History of Present Illness (HPI) 12/12/18 on evaluation today patient presents as a referral from his endocrinologist regarding issues that he has been having with his left great toe. Subsequently he has been seeing Dr. Cleda Mccreedy and Dr. Cleda Mccreedy has been the breeding the wound and in fact this was debrided this morning. The patient had an appointment there prior to coming here. Subsequently he states that even though he's been going for regular debridement after office things really do not seem to be showing signs of improvement unfortunately. He states that he is having some discomfort but nothing too significant. No fevers, chills,  nausea, or vomiting noted at this time. The patient does have a history of hypertension, neuropathy, diabetes mellitus type II, and a history of stroke. Currently he's been using bacitracin on the toe and utilizing a Band-Aid. He tells me he's had this wound for two years. He cannot remember the last time he had  an x-ray. 12/19/18 on evaluation today patient appears to be doing well in regard to his plantar toe ulcer. He's been tolerating the dressing changes without complication. Fortunately there does not appear to be signs of infection at this time which is good news. No fever chills noted. 01/19/19 has been one month since I last saw the patient as he was out of town. With that being said on evaluation today it does appear that he is going to require sharp debridement and I do believe that the total contact cast would benefit him as far as being applied at this time. He is in agreement that plan. 01/23/19 on evaluation today patient appears to be doing better in regard to his left great toe ulcer. He has been shown signs of improvement week by week and although this is slow it does seem to be doing fairly well which is good news. Overall very pleased in this regard. 01/31/19 on evaluation today patient presents for follow-up concerning his great toe ulcer. He was actually supposed to be here yesterday and he did come however he ended up having to leave due to his blood sugar dropping he states he just did not eat enough lunch. Fortunately seems to be doing much better today which is excellent news. Fortunately there's no evidence of infection at this time which is also good news. No fevers, chills, nausea, or vomiting noted at this time. Overall I feel like he is making excellent progress. 02/06/19 on evaluation today patient actually appears to be doing very well in regard to his plantar toe ulcer. He's been tolerating the dressing changes without complication. Fortunately there is no sign of infection at this time. Overall been very pleased with how things seem to be progressing. No fevers, chills, nausea, or vomiting noted at this time. 02/13/19 on evaluation today patient actually appears to be doing excellent in regard to his toe ulcer which is almost completely close. Overall very pleased with that.  There does not appear to be any signs of infection which is good news. Upon close inspection it appears that he has just a very slight opening still noted at the central portion of the toe although this is minimal. 02/16/19 on evaluation today patient is seen for early follow-up due to the fact that he tells me while at home he actually got up to go answer the door without putting on the walking boot part of his cast and subsequently damaged the total contact cast. It therefore comes in to have this removed and potentially replaced. Fortunately other than it given him a little bit of pain its far as pushing in on some areas there doesn't appear to be any injury once the cast was removed. [name], [name] ([id number]) 03/13/19 on evaluation today patient unfortunately presents for follow-up concerning his great toe ulcer on the left. He was discharged on 02/16/19 with the wound healed at that point. He tells me this remain so for several weeks or least a couple weeks before reopening. Fortunately there does not appear to be any signs of infection at this time. Nonetheless for the past week at least he's had the wound reopened and states  it is been trying to keep pressure off of this but he has not been using the offloading shoe we previously gave him. Fortunately there's no evidence of infection at this time. No fevers, chills, nausea, or vomiting noted at this time. 03/17/19 on evaluation today patient actually appears to be doing rather well in regard to his toe also. Fact this appears to be significantly smaller this week even compared to what I saw last week on evaluation. He seems to have done very well for the offloading shoe and overall I'm very pleased with the progress that is made. It may be that he doesn't even need the cast to get this to heal if he offloads this appropriately. 03/24/19 on evaluation today patient actually appears to be doing well in regard to his plantar foot ulcer.  He's been tolerating the dressing changes without complication the collagen does seem to be beneficial for him. With that being said he is gonna require some miles sharp debridement today to clear away some of the callous's around the edge of the wound as well as the minimal Slough noted on the surface of the wound. 03/31/19 on evaluation today patient appears to be doing well in regard to his plantar great toe ulcer. He has been tolerating the dressing changes without complication which is good news. Fortunately there does not appear to be any signs of active infection at this time. Overall very pleased with how things seem to be progressing. 04/11/19 on evaluation today patient's tools are appears to be doing in my pinion roughly about the same as were things that previous. Fortunately there does not appear to be signs of active infection at this time which is good news. With that being said he has not been experiencing any pain at this time which is good news there is also No fevers, chills, nausea, or vomiting noted at this time. 04/18/19 on evaluation today patient's wound actually appears to be doing fairly well today he did not even really have a lot of callous buildup. We have been debating on whether or not to reinitiate total contact cast. With that being said he seems to be doing fairly well this point with offloading in my pinion and again I think we need to come up with a way to get this healed that will also be sustainable for keeping it close. Again we were able to close it with the cast previous but again he has to be able to keep it such. For that reason we are working on trying to get him diabetic shoes he's waiting for the [place] to get in touch with the local company that has to measure him for these do not want to have a cast on at such a time as when they need to actually measure him. 04/25/19 on evaluation today patient appears to be doing well in regard to his plantar foot  specifically great toe ulcer. He's been tolerating the dressing changes without complication. Fortunately there's no signs of active infection at this time. Overall I'm very pleased with how things appear currently. 05/02/19 on evaluation today patient appears to actually be doing better in regard to his plantar foot ulcer. He shown signs of good improvement which is excellent news. She states he can walk better with the cast on the can with the offloading shoe that he had on previous. Fortunately there's no signs of active infection at this time. No fevers, chills, nausea, or vomiting noted at this time. 05/09/19 on evaluation  today patient actually appears to be doing excellent. In fact he appears to be completely healed based on evaluation at this time. Fortunately there's no signs of infection and is having no discomfort. 06/08/19 this is a follow-up visit although the patient has been healed for almost a month but not quite. He tells me that in the past several days he noted some blood coming from his toe and he thought he should come in at this checked out. Fortunately he's having a pain. He also did get his custom orthotics shoes. With that being said he still appears to have developed this ulceration there is something he tells me that the orthotic specialist stated they could do to the toes to try to help out in this regard although he did not know exactly what it was we may need to check with [facility]. 06/12/19 on evaluation today patient actually appears to be doing great in regard to his foot ulcer. The wound on his toes shown signs of excellent improvement which is great news. With that being said he is not completely close yet the cast has done wonders for him and he is definitely headed in the correct direction. No fevers, chills, nausea, or vomiting noted at this time. 06/22/19 on evaluation today fortunately patient actually appears to be healing quite well and in fact appears to be  completely healed this is excellent news. Fortunately there's no signs of active infection he's been tolerating the total contact cast without complication. Overall I'm pleased with the progress that has been made. He also has his new custom shoes he brought was [name], [name] ([id number]) with him today. Patient History Information obtained from Patient. Family History Diabetes - Mother, Heart Disease - Father, Stroke - Father, No family history of Cancer, Hereditary Spherocytosis, Hypertension, Kidney Disease, Lung Disease, Seizures, Thyroid Problems, Tuberculosis. Social History Never smoker, Marital Status - Widowed, Alcohol Use - Never, Drug Use - No History, Caffeine Use - Moderate. Medical History Eyes Denies history of Cataracts, Glaucoma, Optic Neuritis Ear/Nose/Mouth/Throat Denies history of Chronic sinus problems/congestion, Middle ear problems Hematologic/Lymphatic Denies history of Anemia, Hemophilia, Human Immunodeficiency Virus, Lymphedema Respiratory Denies history of Aspiration, Asthma, Chronic Obstructive Pulmonary Disease (COPD), Pneumothorax, Sleep Apnea, Tuberculosis Cardiovascular Patient has history of Hypertension Denies history of Angina, Arrhythmia, Congestive Heart Failure, Coronary Artery Disease, Deep Vein Thrombosis, Hypotension, Myocardial Infarction, Peripheral Arterial Disease, Peripheral Venous Disease, Phlebitis, Vasculitis Gastrointestinal Denies history of Cirrhosis , Colitis, Crohn s, Hepatitis A, Hepatitis B, Hepatitis C Endocrine Patient has history of Type II Diabetes Denies history of Type I Diabetes Genitourinary Denies history of End Stage Renal Disease Immunological Denies history of Lupus Erythematosus, Raynaud s, Scleroderma Integumentary (Skin) Denies history of History of Burn, History of pressure wounds Musculoskeletal Denies history of Gout, Rheumatoid Arthritis, Osteoarthritis, Osteomyelitis Neurologic Denies history  of Dementia, Neuropathy, Quadriplegia, Paraplegia, Seizure Disorder Oncologic Denies history of Received Chemotherapy, Received Radiation Review of Systems (ROS) Constitutional Symptoms (General Health) Denies complaints or symptoms of Fatigue, Fever, Chills, Marked Weight Change. Respiratory Denies complaints or symptoms of Chronic or frequent coughs, Shortness of Breath. Cardiovascular Denies complaints or symptoms of Chest pain, LE edema. Psychiatric Denies complaints or symptoms of Anxiety, Claustrophobia. [name], [name] ([id number]) Objective Constitutional Well-nourished and well-hydrated in no acute distress. Vitals Time Taken: 12:42 PM, Height: 72 in, Weight: 250 lbs, BMI: 33.9, Temperature: 98.3 F, Pulse: 74 bpm, Respiratory Rate: 18 breaths/min, Blood Pressure: 159/93 mmHg. Respiratory normal breathing without difficulty. clear to auscultation bilaterally. Cardiovascular  regular rate and rhythm with normal S1, S2. Psychiatric this patient is able to make decisions and demonstrates good insight into disease process. Alert and Oriented x 3. pleasant and cooperative. General Notes: On evaluation today patient's wound bed actually showed signs of excellent improvement. Fortunately there's no signs of active infection which is great news. He tolerated the cast and appears that the wound is completely covered over with new skin upon today's inspection. Integumentary (Hair, Skin) Wound #1R status is Healed - Epithelialized. Original cause of wound was Gradually Appeared. The wound is located on the Left Toe Great. The wound measures 0cm length x 0cm width x 0cm depth; 0cm^2 area and 0cm^3 volume. There is Fat Layer (Subcutaneous Tissue) Exposed exposed. There is no tunneling or undermining noted. There is a none present amount of drainage noted. The wound margin is thickened. There is no granulation within the wound bed. There is a large (67-100%) amount of necrotic  tissue within the wound bed including Eschar and Adherent Slough. Assessment Active Problems ICD-10 Type 2 diabetes mellitus with foot ulcer Non-pressure chronic ulcer of other part of left foot with fat layer exposed Essential (primary) hypertension Type 2 diabetes mellitus with diabetic autonomic (poly)neuropathy Plan Follow-up Appointments: Other: - Return Tuesday, June 30th Off-Loading: Other: - orthopedic felt at the prior wound site on the left great toe, pt will be wearing his custom shoes [name], [name] ([id number]) My suggestion at this time is gonna be that we continue with the above wound care measures for the next week and the patient is in the agreement with plan. We will subsequently see were things stand at follow-up. If anything changes worsens meantime he will let me know otherwise I'm gonna see him on Tuesday for a second check to ensure that everything appears to be doing okay from the standpoint of the wound not reopening. We will use an orthopedic offloading felt to prevent hopefully friction and pressure to the healed wound site. Especially with his new orthopedic offloading shoes my hope is that he will be able to prevent this from worsening at this point. We will see were things stand at follow-up. Please see above for specific wound care orders. We will see patient for re-evaluation in 1 week(s) here in the clinic. If anything worsens or changes patient will contact our office for additional recommendations. I explained to the patient I would like to see him one more time at least next Tuesday to ensure nothing is reopening before discharge. He's in agreement that plan. Electronic Signature(s) Signed: 06/25/2019 11:25:56 PM By: Worthy Keeler PA-C Entered By: Worthy Keeler on 06/22/2019 13:48:34 [place], [name] ([id number]) -------------------------------------------------------------------------------- ROS/PFSH Details Patient Name: [name] Date of Service: 06/22/2019 12:30 PM Medical Record Number: [id number] Patient Account Number: [id number] Date of Birth/Sex: [birth year] (73 y.o. M) Treating RN: Army Melia Primary Care Provider: Glendon Axe Other Clinician: Referring Provider: Glendon Axe Treating Provider/Extender: STONE III, HOYT Weeks in Treatment: 27 Information Obtained From Patient Constitutional Symptoms (General Health) Complaints and Symptoms: Negative for: Fatigue; Fever; Chills; Marked Weight Change Respiratory Complaints and Symptoms: Negative for: Chronic or frequent coughs; Shortness of Breath Medical History: Negative for: Aspiration; Asthma; Chronic Obstructive Pulmonary Disease (COPD); Pneumothorax; Sleep Apnea; Tuberculosis Cardiovascular Complaints and Symptoms: Negative for: Chest pain; LE edema Medical History: Positive for: Hypertension Negative for: Angina; Arrhythmia; Congestive Heart Failure; Coronary Artery Disease; Deep Vein Thrombosis; Hypotension; Myocardial Infarction; Peripheral Arterial Disease; Peripheral Venous Disease; Phlebitis; Vasculitis Psychiatric Complaints and Symptoms:  Negative for: Anxiety; Claustrophobia Eyes Medical History: Negative for: Cataracts; Glaucoma; Optic Neuritis Ear/Nose/Mouth/Throat Medical History: Negative for: Chronic sinus problems/congestion; Middle ear problems Hematologic/Lymphatic Medical History: Negative for: Anemia; Hemophilia; Human Immunodeficiency Virus; Lymphedema Gastrointestinal ZAIR, BORAWSKI ([id number]) Medical History: Negative for: Cirrhosis ; Colitis; Crohnos; Hepatitis A; Hepatitis B; Hepatitis C Endocrine Medical History: Positive for: Type II Diabetes Negative for: Type I Diabetes Time with diabetes: 1990 Treated with: Insulin Blood sugar tested every day: Yes Tested : Genitourinary Medical History: Negative for: End Stage Renal Disease Immunological Medical History: Negative for:  Lupus Erythematosus; Raynaudos; Scleroderma Integumentary (Skin) Medical History: Negative for: History of Burn; History of pressure wounds Musculoskeletal Medical History: Negative for: Gout; Rheumatoid Arthritis; Osteoarthritis; Osteomyelitis Neurologic Medical History: Negative for: Dementia; Neuropathy; Quadriplegia; Paraplegia; Seizure Disorder Oncologic Medical History: Negative for: Received Chemotherapy; Received Radiation Immunizations Pneumococcal Vaccine: Received Pneumococcal Vaccination: Yes Implantable Devices No devices added Family and Social History Cancer: No; Diabetes: Yes - Mother; Heart Disease: Yes - Father; Hereditary Spherocytosis: No; Hypertension: No; Kidney Disease: No; Lung Disease: No; Seizures: No; Stroke: Yes - Father; Thyroid Problems: No; Tuberculosis: No; Never smoker; Marital Status - Widowed; Alcohol Use: Never; Drug Use: No History; Caffeine Use: Moderate; Financial Concerns: No; Food, Clothing or Shelter Needs: No; Support System Lacking: No; Transportation Concerns: No Physician Affirmation I have reviewed and agree with the above information. [name], [name] ([id number]) Electronic Signature(s) Signed: 06/22/2019 4:27:33 PM By: Army Melia Signed: 06/25/2019 11:25:56 PM By: Worthy Keeler PA-C Entered By: Worthy Keeler on 06/22/2019 13:46:34 [place], [name] ([id number]) -------------------------------------------------------------------------------- SuperBill Details Patient Name: [name] Date of Service: 06/22/2019 Medical Record Number: [id number] Patient Account Number: [id number] Date of Birth/Sex: [birth year] (73 y.o. M) Treating RN: Army Melia Primary Care Provider: Glendon Axe Other Clinician: Referring Provider: Glendon Axe Treating Provider/Extender: STONE III, HOYT Weeks in Treatment: 27 Diagnosis Coding ICD-10 Codes Code Description E11.621 Type 2 diabetes mellitus with foot  ulcer L97.522 Non-pressure chronic ulcer of other part of left foot with fat layer exposed I10 Essential (primary) hypertension E11.43 Type 2 diabetes mellitus with diabetic autonomic (poly)neuropathy Facility Procedures CPT4 Code: [id number] Description: [phone] - WOUND CARE VISIT-LEV 2 EST PT Modifier: Quantity: 1 Physician Procedures CPT4 Code: [id number] Description: 26712 - WC PHYS LEVEL 4 - EST PT ICD-10 Diagnosis Description E11.621 Type 2 diabetes mellitus with foot ulcer L97.522 Non-pressure chronic ulcer of other part of left foot with fat I10 Essential (primary) hypertension E11.43 Type 2 diabetes  mellitus with diabetic autonomic (poly)neuropa Modifier: layer exposed thy Quantity: 1 Electronic Signature(s) Signed: 06/25/2019 11:25:56 PM By: Worthy Keeler PA-C Entered By: Worthy Keeler on 06/22/2019 13:48:44

## 2019-06-25 NOTE — Progress Notes (Signed)
Fred, [name] ([id number]) Visit Report for 06/22/2019 Arrival Information Details Patient Name: [name], [name]. Date of Service: 06/22/2019 12:30 PM Medical Record Number: [id number] Patient Account Number: [id number] Date of Birth/Sex: 07-20-1946 (73 y.o. M) Treating RN: Army Melia Primary Care Rosalia Mcavoy: Glendon Axe Other Clinician: Referring Donald Memoli: Glendon Axe Treating Penn Grissett/Extender: STONE III, HOYT Weeks in Treatment: 27 Visit Information History Since Last Visit Added or deleted any medications: No Patient Arrived: Ambulatory Any new allergies or adverse reactions: No Arrival Time: 12:43 Had a fall or experienced change in No Accompanied By: self activities of daily living that may affect Transfer Assistance: None risk of falls: Patient Identification Verified: Yes Signs or symptoms of abuse/neglect since last visito No Secondary Verification Process Completed: Yes Hospitalized since last visit: No Patient Has Alerts: Yes Implantable device outside of the clinic excluding No Patient Alerts: DMII cellular tissue based products placed in the center since last visit: Has Dressing in Place as Prescribed: Yes Pain Present Now: No Electronic Signature(s) Signed: 06/22/2019 4:04:47 PM By: Lorine Bears RCP, RRT, CHT Entered By: Lorine Bears on 06/22/2019 12:43:37 [place], [name] ([id number]) -------------------------------------------------------------------------------- Clinic Level of Care Assessment Details Patient Name: [name] Date of Service: 06/22/2019 12:30 PM Medical Record Number: [id number] Patient Account Number: [id number] Date of Birth/Sex: Feb 20, 1946 (73 y.o. M) Treating RN: Army Melia Primary Care Khamauri Bauernfeind: Glendon Axe Other Clinician: Referring Quavis Klutz: Glendon Axe Treating Eryn Krejci/Extender: STONE III, HOYT Weeks in Treatment: 27 Clinic Level of Care Assessment  Items TOOL 4 Quantity Score []  - Use when only an EandM is performed on FOLLOW-UP visit 0 ASSESSMENTS - Nursing Assessment / Reassessment X - Reassessment of Co-morbidities (includes updates in patient status) 1 10 X- 1 5 Reassessment of Adherence to Treatment Plan ASSESSMENTS - Wound and Skin Assessment / Reassessment X - Simple Wound Assessment / Reassessment - one wound 1 5 []  - 0 Complex Wound Assessment / Reassessment - multiple wounds []  - 0 Dermatologic / Skin Assessment (not related to wound area) ASSESSMENTS - Focused Assessment []  - Circumferential Edema Measurements - multi extremities 0 []  - 0 Nutritional Assessment / Counseling / Intervention []  - 0 Lower Extremity Assessment (monofilament, tuning fork, pulses) []  - 0 Peripheral Arterial Disease Assessment (using hand held doppler) ASSESSMENTS - Ostomy and/or Continence Assessment and Care []  - Incontinence Assessment and Management 0 []  - 0 Ostomy Care Assessment and Management (repouching, etc.) PROCESS - Coordination of Care X - Simple Patient / Family Education for ongoing care 1 15 []  - 0 Complex (extensive) Patient / Family Education for ongoing care []  - 0 Staff obtains Programmer, systems, Records, Test Results / Process Orders []  - 0 Staff telephones HHA, Nursing Homes / Clarify orders / etc []  - 0 Routine Transfer to another Facility (non-emergent condition) []  - 0 Routine Hospital Admission (non-emergent condition) []  - 0 New Admissions / Biomedical engineer / Ordering NPWT, Apligraf, etc. []  - 0 Emergency Hospital Admission (emergent condition) X- 1 10 Simple Discharge Coordination TOBY, BREITHAUPT ([id number]) []  - 0 Complex (extensive) Discharge Coordination PROCESS - Special Needs []  - Pediatric / Minor Patient Management 0 []  - 0 Isolation Patient Management []  - 0 Hearing / Language / Visual special needs []  - 0 Assessment of Community assistance (transportation, D/C planning,  etc.) []  - 0 Additional assistance / Altered mentation []  - 0 Support Surface(s) Assessment (bed, cushion, seat, etc.) INTERVENTIONS - Wound Cleansing / Measurement X - Simple Wound Cleansing - one wound 1 5 []  -  0 Complex Wound Cleansing - multiple wounds X- 1 5 Wound Imaging (photographs - any number of wounds) []  - 0 Wound Tracing (instead of photographs) X- 1 5 Simple Wound Measurement - one wound []  - 0 Complex Wound Measurement - multiple wounds INTERVENTIONS - Wound Dressings X - Small Wound Dressing one or multiple wounds 1 10 []  - 0 Medium Wound Dressing one or multiple wounds []  - 0 Large Wound Dressing one or multiple wounds []  - 0 Application of Medications - topical []  - 0 Application of Medications - injection INTERVENTIONS - Miscellaneous []  - External ear exam 0 []  - 0 Specimen Collection (cultures, biopsies, blood, body fluids, etc.) []  - 0 Specimen(s) / Culture(s) sent or taken to Lab for analysis []  - 0 Patient Transfer (multiple staff / Civil Service fast streamer / Similar devices) []  - 0 Simple Staple / Suture removal (25 or less) []  - 0 Complex Staple / Suture removal (26 or more) []  - 0 Hypo / Hyperglycemic Management (close monitor of Blood Glucose) []  - 0 Ankle / Brachial Index (ABI) - do not check if billed separately X- 1 5 Vital Signs Cease, ZAYAN DELVECCHIO ([id number]) Has the patient been seen at the hospital within the last three years: Yes Total Score: 75 Level Of Care: New/Established - Level 2 Electronic Signature(s) Signed: 06/22/2019 4:27:33 PM By: Army Melia Entered By: Army Melia on 06/22/2019 13:22:37 [name] ([id number]) -------------------------------------------------------------------------------- Encounter Discharge Information Details Patient Name: [name]. Date of Service: 06/22/2019 12:30 PM Medical Record Number: [id number] Patient Account Number: [id number] Date of Birth/Sex: [birth year] (73 y.o.  M) Treating RN: Army Melia Primary Care Holle Sprick: Glendon Axe Other Clinician: Referring Viyan Rosamond: Glendon Axe Treating Bralynn Donado/Extender: STONE III, HOYT Weeks in Treatment: 17 Encounter Discharge Information Items Discharge Condition: Stable Ambulatory Status: Ambulatory Discharge Destination: Home Transportation: Private Auto Accompanied By: self Schedule Follow-up Appointment: Yes Clinical Summary of Care: Electronic Signature(s) Signed: 06/22/2019 4:27:33 PM By: Army Melia Entered By: Army Melia on 06/22/2019 13:23:11 [place], [name] ([id number]) -------------------------------------------------------------------------------- Lower Extremity Assessment Details Patient Name: [name] Date of Service: 06/22/2019 12:30 PM Medical Record Number: [id number] Patient Account Number: [id number] Date of Birth/Sex: Feb 02, 1946 (73 y.o. M) Treating RN: Montey Hora Primary Care Eliab Closson: Glendon Axe Other Clinician: Referring Kent Riendeau: Glendon Axe Treating Hayzel Ruberg/Extender: STONE III, HOYT Weeks in Treatment: 27 Edema Assessment Assessed: [Left: No] [Right: No] Edema: [Left: N] [Right: o] Vascular Assessment Pulses: Dorsalis Pedis Palpable: [Left:Yes] Electronic Signature(s) Signed: 06/22/2019 5:10:09 PM By: Montey Hora Entered By: Montey Hora on 06/22/2019 12:59:47 [place], [name] ([id number]) -------------------------------------------------------------------------------- Multi Wound Chart Details Patient Name: [name]. Date of Service: 06/22/2019 12:30 PM Medical Record Number: [id number] Patient Account Number: [id number] Date of Birth/Sex: [birth year] (73 y.o. M) Treating RN: Army Melia Primary Care Giulianna Rocha: Glendon Axe Other Clinician: Referring Dajuan Turnley: Glendon Axe Treating Jaionna Weisse/Extender: STONE III, HOYT Weeks in Treatment: 27 Vital Signs Height(in): 72 Pulse(bpm): 74 Weight(lbs):  250 Blood Pressure(mmHg): 159/93 Body Mass Index(BMI): 34 Temperature(F): 98.3 Respiratory Rate 18 (breaths/min): Photos: [N/A:N/A] Wound Location: Left Toe Great N/A N/A Wounding Event: Gradually Appeared N/A N/A Primary Etiology: Diabetic Wound/Ulcer of the N/A N/A Lower Extremity Comorbid History: Hypertension, Type II Diabetes N/A N/A Date Acquired: 11/30/2017 N/A N/A Weeks of Treatment: 27 N/A N/A Wound Status: Open N/A N/A Wound Recurrence: Yes N/A N/A Measurements L x W x D 0.3x0.3x0.1 N/A N/A (cm) Area (cm) : 0.071 N/A N/A Volume (cm) : 0.007 N/A N/A % Reduction in Area: 88.80% N/A N/A %  Reduction in Volume: 89.10% N/A N/A Classification: Grade 2 N/A N/A Exudate Amount: None Present N/A N/A Wound Margin: Thickened N/A N/A Granulation Amount: None Present (0%) N/A N/A Necrotic Amount: Large (67-100%) N/A N/A Necrotic Tissue: Eschar, Adherent Slough N/A N/A Exposed Structures: Fat Layer (Subcutaneous N/A N/A Tissue) Exposed: Yes Fascia: No Tendon: No Muscle: No Joint: No Bone: No Epithelialization: Small (1-33%) N/A N/A TOWNES, FUHS ([id number]) Treatment Notes Electronic Signature(s) Signed: 06/22/2019 4:27:33 PM By: Army Melia Entered By: Army Melia on 06/22/2019 13:11:42 [place], [name] ([id number]) -------------------------------------------------------------------------------- [place] Details Patient Name: [name]. Date of Service: 06/22/2019 12:30 PM Medical Record Number: [id number] Patient Account Number: [id number] Date of Birth/Sex: 08-28-1946 (73 y.o. M) Treating RN: Army Melia Primary Care Okey Zelek: Glendon Axe Other Clinician: Referring Shareese Macha: Glendon Axe Treating Nickayla Mcinnis/Extender: STONE III, HOYT Weeks in Treatment: 27 Active Inactive Abuse / Safety / Falls / Self Care Management Nursing Diagnoses: Potential for falls Goals: Patient will remain injury free related to  falls Date Initiated: 01/19/2019 Target Resolution Date: 07/29/2019 Goal Status: Active Interventions: Assess fall risk on admission and as needed Notes: Orientation to the Wound Care Program Nursing Diagnoses: Knowledge deficit related to the wound healing center program Goals: Patient/caregiver will verbalize understanding of the [facility] Date Initiated: 01/19/2019 Target Resolution Date: 08/11/2019 Goal Status: Active Interventions: Provide education on orientation to the wound center Notes: Peripheral Neuropathy Nursing Diagnoses: Knowledge deficit related to disease process and management of peripheral neurovascular dysfunction Goals: Patient/caregiver will verbalize understanding of disease process and disease management Date Initiated: 03/17/2019 Target Resolution Date: 08/26/2019 Goal Status: Active Interventions: Assess signs and symptoms of neuropathy upon admission and as needed NYKOLAS, BACALLAO ([id number]) Provide education on Management of Neuropathy and Related Ulcers Notes: Wound/Skin Impairment Nursing Diagnoses: Impaired tissue integrity Goals: Ulcer/skin breakdown will have a volume reduction of 30% by week 4 Date Initiated: 12/12/2018 Target Resolution Date: 08/26/2019 Goal Status: Active Interventions: Assess patient/caregiver ability to obtain necessary supplies Assess patient/caregiver ability to perform ulcer/skin care regimen upon admission and as needed Notes: Electronic Signature(s) Signed: 06/22/2019 4:27:33 PM By: Army Melia Entered By: Army Melia on 06/22/2019 13:11:27 [name] ([id number]) -------------------------------------------------------------------------------- Pain Assessment Details Patient Name: [name]. Date of Service: 06/22/2019 12:30 PM Medical Record Number: [id number] Patient Account Number: [id number] Date of Birth/Sex: [birth year] (73 y.o. M) Treating RN: Army Melia Primary Care Andrue Dini: Glendon Axe Other Clinician: Referring Kindal Ponti: Glendon Axe Treating Woody Kronberg/Extender: STONE III, HOYT Weeks in Treatment: 27 Active Problems Location of Pain Severity and Description of Pain Patient Has Paino No Site Locations Pain Management and Medication Current Pain Management: Electronic Signature(s) Signed: 06/22/2019 4:04:47 PM By: Paulla Fore, RRT, CHT Signed: 06/22/2019 4:27:33 PM By: Army Melia Entered By: Lorine Bears on 06/22/2019 12:43:50 [place], [name] ([id number]) -------------------------------------------------------------------------------- Patient/Caregiver Education Details Patient Name: [name]. Date of Service: 06/22/2019 12:30 PM Medical Record Number: [id number] Patient Account Number: [id number] Date of Birth/Gender: Mar 04, 1946 (73 y.o. M) Treating RN: Army Melia Primary Care Physician: Glendon Axe Other Clinician: Referring Physician: Glendon Axe Treating Physician/Extender: Melburn Hake, HOYT Weeks in Treatment: 43 Education Assessment Education Provided To: Patient Education Topics Provided Wound/Skin Impairment: Handouts: Caring for Your Ulcer Methods: Demonstration, Explain/Verbal Responses: State content correctly Electronic Signature(s) Signed: 06/22/2019 4:27:33 PM By: Army Melia Entered By: Army Melia on 06/22/2019 13:22:51 [place], [name] ([id number]) -------------------------------------------------------------------------------- Wound Assessment Details Patient Name: [name]. Date of Service: 06/22/2019 12:30 PM Medical  Record Number: [id number] Patient Account Number: [id number] Date of Birth/Sex: 30-Apr-1946 (73 y.o. M) Treating RN: Army Melia Primary Care Bertina Guthridge: Glendon Axe Other Clinician: Referring Adalberto Metzgar: Glendon Axe Treating Belinda Schlichting/Extender: STONE III, HOYT Weeks in Treatment: 27 Wound  Status Wound Number: [id number] Primary Etiology: Diabetic Wound/Ulcer of the Lower Extremity Wound Location: Left Toe Great Wound Status: Healed - Epithelialized Wounding Event: Gradually Appeared Comorbid Hypertension, Type II Diabetes Date Acquired: 11/30/2017 History: Weeks Of Treatment: 27 Clustered Wound: No Photos Wound Measurements Length: (cm) 0 % Red Width: (cm) 0 % Red Depth: (cm) 0 Epith Area: (cm) 0 Tunn Volume: (cm) 0 Unde uction in Area: 100% uction in Volume: 100% elialization: Small (1-33%) eling: No rmining: No Wound Description Classification: Grade 2 Wound Margin: Thickened Exudate Amount: None Present Foul Odor After Cleansing: No Slough/Fibrino No Wound Bed Granulation Amount: None Present (0%) Exposed Structure Necrotic Amount: Large (67-100%) Fascia Exposed: No Necrotic Quality: Eschar, Adherent Slough Fat Layer (Subcutaneous Tissue) Exposed: Yes Tendon Exposed: No Muscle Exposed: No Joint Exposed: No Bone Exposed: No Electronic Signature(s) Signed: 06/22/2019 4:27:33 PM By: Army Melia Entered By: Army Melia on 06/22/2019 13:19:23 [place], [name] ([id number]) [place], [name] ([id number]) -------------------------------------------------------------------------------- Vitals Details Patient Name: [name]. Date of Service: 06/22/2019 12:30 PM Medical Record Number: [id number] Patient Account Number: [id number] Date of Birth/Sex: 20-Mar-1946 (73 y.o. M) Treating RN: Army Melia Primary Care Vencil Basnett: Glendon Axe Other Clinician: Referring Tayva Easterday: Glendon Axe Treating Oluwanifemi Petitti/Extender: STONE III, HOYT Weeks in Treatment: 27 Vital Signs Time Taken: 12:42 Temperature (F): 98.3 Height (in): 72 Pulse (bpm): 74 Weight (lbs): 250 Respiratory Rate (breaths/min): 18 Body Mass Index (BMI): 33.9 Blood Pressure (mmHg): 159/93 Reference Range: 80 - 120 mg / dl Electronic Signature(s) Signed: 06/22/2019  4:04:47 PM By: Lorine Bears RCP, RRT, CHT Entered By: Lorine Bears on 06/22/2019 12:44:21

## 2019-06-27 ENCOUNTER — Ambulatory Visit: Payer: Non-veteran care | Admitting: Physician Assistant

## 2019-06-28 ENCOUNTER — Ambulatory Visit: Payer: Non-veteran care | Admitting: Internal Medicine

## 2019-07-04 ENCOUNTER — Encounter: Payer: Medicare Other | Attending: Physician Assistant | Admitting: Physician Assistant

## 2019-07-04 ENCOUNTER — Other Ambulatory Visit: Payer: Self-pay

## 2019-07-04 DIAGNOSIS — E11621 Type 2 diabetes mellitus with foot ulcer: Secondary | ICD-10-CM | POA: Diagnosis not present

## 2019-07-04 DIAGNOSIS — Z8673 Personal history of transient ischemic attack (TIA), and cerebral infarction without residual deficits: Secondary | ICD-10-CM | POA: Diagnosis not present

## 2019-07-04 DIAGNOSIS — Z794 Long term (current) use of insulin: Secondary | ICD-10-CM | POA: Insufficient documentation

## 2019-07-04 DIAGNOSIS — E1142 Type 2 diabetes mellitus with diabetic polyneuropathy: Secondary | ICD-10-CM | POA: Diagnosis not present

## 2019-07-04 DIAGNOSIS — I10 Essential (primary) hypertension: Secondary | ICD-10-CM | POA: Diagnosis not present

## 2019-07-04 DIAGNOSIS — Z8249 Family history of ischemic heart disease and other diseases of the circulatory system: Secondary | ICD-10-CM | POA: Diagnosis not present

## 2019-07-04 DIAGNOSIS — L97522 Non-pressure chronic ulcer of other part of left foot with fat layer exposed: Secondary | ICD-10-CM | POA: Insufficient documentation

## 2019-07-05 NOTE — Progress Notes (Signed)
HEBER, HOOG ([id number]) Visit Report for 07/04/2019 Arrival Information Details Patient Name: [name], [name]. Date of Service: 07/04/2019 3:00 PM Medical Record Number: [id number] Patient Account Number: [id number] Date of Birth/Sex: 1946/01/27 (73 y.o. M) Treating RN: Army Melia Primary Care Kalany Diekmann: Glendon Axe Other Clinician: Referring Chandrea Zellman: Glendon Axe Treating Lenice Koper/Extender: STONE III, HOYT Weeks in Treatment: 29 Visit Information History Since Last Visit Added or deleted any medications: No Patient Arrived: Cane Any new allergies or adverse reactions: No Arrival Time: 15:00 Had a fall or experienced change in No Accompanied By: self activities of daily living that may affect Transfer Assistance: Manual risk of falls: Patient Has Alerts: Yes Signs or symptoms of abuse/neglect since last visito No Patient Alerts: DMII Hospitalized since last visit: No Has Dressing in Place as Prescribed: Yes Pain Present Now: No Electronic Signature(s) Signed: 07/04/2019 4:16:55 PM By: Army Melia Entered By: Army Melia on 07/04/2019 15:00:51 [name] ([id number]) -------------------------------------------------------------------------------- Lower Extremity Assessment Details Patient Name: [name] Date of Service: 07/04/2019 3:00 PM Medical Record Number: [id number] Patient Account Number: [id number] Date of Birth/Sex: 10/11/1946 (74 y.o. M) Treating RN: Army Melia Primary Care Riku Buttery: Glendon Axe Other Clinician: Referring Maven Varelas: Glendon Axe Treating Kavion Mancinas/Extender: STONE III, HOYT Weeks in Treatment: 29 Edema Assessment Assessed: [Left: No] [Right: No] Edema: [Left: N] [Right: o] Vascular Assessment Pulses: Dorsalis Pedis Palpable: [Left:Yes] Electronic Signature(s) Signed: 07/04/2019 4:16:55 PM By: Army Melia Entered By: Army Melia on 07/04/2019 15:02:59 [place], [name]  ([id number]) -------------------------------------------------------------------------------- Multi Wound Chart Details Patient Name: [name]. Date of Service: 07/04/2019 3:00 PM Medical Record Number: [id number] Patient Account Number: [id number] Date of Birth/Sex: July 26, 1946 (73 y.o. 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] Comorbid History: [1R:Hypertension, Type II Diabetes N/A] Date Acquired: [1R:11/30/2017] [N/A:N/A] Weeks of Treatment: [1R:29] [N/A:N/A] Wound Status: [1R:Open] [N/A:N/A] Wound Recurrence: [1R:Yes] [N/A:N/A] Measurements L x W x D [1R:0.2x0.2x0.1] [N/A:N/A] (cm) Area (cm) : [1R:0.031] [N/A:N/A] Volume (cm) : [1R:0.003] [N/A:N/A] % Reduction in Area: [1R:95.10%] [N/A:N/A] % Reduction in Volume: [1R:95.30%] [N/A:N/A] Classification: [1R:Grade 2] [N/A:N/A] Exudate Amount: [1R:None Present] [N/A:N/A] Wound Margin: [1R:Thickened] [N/A:N/A] Granulation Amount: [1R:Large (67-100%)] [N/A:N/A] Necrotic Amount: [1R:Small (1-33%)] [N/A:N/A] Exposed Structures: [1R:Fat Layer (Subcutaneous Tissue) Exposed: Yes Fascia: No Tendon: No Muscle: No Joint: No Bone: No Small (1-33%)] [N/A:N/A N/A] Treatment Notes Electronic Signature(s) Signed: 07/04/2019 5:36:25 PM By: Montey Hora Entered By: Montey Hora on 07/04/2019 15:10:50 [name] ([id number]) [place], [name]  ([id number]) -------------------------------------------------------------------------------- [place] Details Patient Name: [name], [name]. Date of Service: 07/04/2019 3:00 PM Medical Record Number: [id number] Patient Account Number: [id number] Date of Birth/Sex: 10/29/1946 (73 y.o. M) Treating RN: Montey Hora Primary Care Nashid Pellum: Glendon Axe Other Clinician: Referring Nyah Shepherd: Glendon Axe Treating Dorrell Mitcheltree/Extender: STONE III, HOYT Weeks in Treatment: 13 Active Inactive Abuse / Safety / Falls / Self Care Management Nursing Diagnoses: Potential for falls Goals: Patient will remain injury free related to falls Date Initiated: 01/19/2019 Target Resolution Date: 07/29/2019 Goal Status: Active Interventions: Assess fall risk on admission and as needed Notes: Orientation to the Wound Care Program Nursing Diagnoses: Knowledge deficit related to the wound healing center  program Goals: Patient/caregiver will verbalize understanding of the [facility] Date Initiated: 01/19/2019 Target Resolution Date: 08/11/2019 Goal Status: Active Interventions: Provide education on orientation to the wound center Notes: Peripheral Neuropathy Nursing Diagnoses: Knowledge deficit related to disease process and management of peripheral neurovascular dysfunction Goals: Patient/caregiver will verbalize understanding of disease process and disease management Date Initiated: 03/17/2019 Target Resolution Date: 08/26/2019 Goal Status: Active Interventions: Assess signs and symptoms of neuropathy upon admission and as needed [name], [name] ([id number]) Provide education on Management of Neuropathy and Related Ulcers Notes: Wound/Skin Impairment Nursing Diagnoses: Impaired tissue integrity Goals: Ulcer/skin breakdown will have a volume reduction of 30% by week 4 Date Initiated: 12/12/2018 Target Resolution Date: 08/26/2019 Goal Status:  Active Interventions: Assess patient/caregiver ability to obtain necessary supplies Assess patient/caregiver ability to perform ulcer/skin care regimen upon admission and as needed Notes: Electronic Signature(s) Signed: 07/04/2019 5:36:25 PM By: Montey Hora Entered By: Montey Hora on 07/04/2019 15:10:41 [name] ([id number]) -------------------------------------------------------------------------------- Pain Assessment Details Patient Name: [name]. Date of Service: 07/04/2019 3:00 PM Medical Record Number: [id number] Patient Account Number: [id number] Date of Birth/Sex: May 20, 1946 (73 y.o. M) Treating RN: Army Melia Primary Care Janard Culp: Glendon Axe Other Clinician: Referring Herschell Virani: Glendon Axe Treating Shiri Hodapp/Extender: STONE III, HOYT Weeks in Treatment: 29 Active Problems Location of Pain Severity and Description of Pain Patient Has Paino No Site Locations Pain Management and Medication Current Pain Management: Electronic Signature(s) Signed: 07/04/2019 4:16:55 PM By: Army Melia Entered By: Army Melia on 07/04/2019 15:00:58 [name] ([id number]) -------------------------------------------------------------------------------- Patient/Caregiver Education Details Patient Name: [name]. Date of Service: 07/04/2019 3:00 PM Medical Record Number: [id number] Patient Account Number: [id number] Date of Birth/Gender: 05-03-1946 (73 y.o. M) Treating RN: Montey Hora Primary Care Physician: Glendon Axe Other Clinician: Referring Physician: Glendon Axe Treating Physician/Extender: Sharalyn Ink in Treatment: 23 Education Assessment Education Provided To: Patient Education Topics Provided Offloading: Handouts: Other: how to offload toe Methods: Explain/Verbal Responses: State content correctly Electronic Signature(s) Signed: 07/04/2019 5:36:25 PM By: Montey Hora Entered By: Montey Hora  on 07/04/2019 15:17:43 [place], [name] ([id number]) -------------------------------------------------------------------------------- Wound Assessment Details Patient Name: [name]. Date of Service: 07/04/2019 3:00 PM Medical Record Number: [id number] Patient Account Number: [id number] Date of Birth/Sex: [birth year] (73 y.o. M) Treating RN: Montey Hora Primary Care Jermeka Schlotterbeck: Glendon Axe Other Clinician: Referring Tyana Butzer: Glendon Axe Treating Wakeelah Solan/Extender: STONE III, HOYT Weeks in Treatment: 29 Wound Status Wound Number: 1R Primary Etiology: Diabetic Wound/Ulcer of the Lower Extremity Wound Location: Left Toe Great Wound Status: Open Wounding Event: Gradually Appeared Comorbid Hypertension, Type II Diabetes Date Acquired: 11/30/2017 History: Weeks Of Treatment: 29 Clustered Wound: No Wound Measurements Length: (cm) 0.2 Width: (cm) 0.2 Depth: (cm) 0.1 Area: (cm) 0.031 Volume: (cm) 0.003 % Reduction in Area: 95.1% % Reduction in Volume: 95.3% Epithelialization: Small (1-33%) Tunneling: No Undermining: No Wound Description Classification: Grade 2 Foul O Wound Margin: Thickened Slough Exudate Amount: None Present dor After Cleansing: No /Fibrino Yes Wound Bed Granulation Amount: Large (67-100%) Exposed Structure Necrotic Amount: Small (1-33%) Fascia Exposed: No Necrotic Quality: Adherent Slough Fat Layer (Subcutaneous Tissue) Exposed: Yes Tendon Exposed: No Muscle Exposed: No Joint Exposed: No Bone Exposed: No Treatment Notes Wound #1R (Left Toe Great) Notes prisma, foam, conform Electronic Signature(s) Signed: 07/04/2019 5:36:25 PM By: Montey Hora Entered By: Montey Hora on 07/04/2019 15:10:23 [name] ([id number]) -------------------------------------------------------------------------------- Vitals Details Patient Name: [name]. Date of Service: 07/04/2019 3:00 PM Medical Record Number:  [id number] Patient  Account Number: [id number] Date of Birth/Sex: 11-28-1946 (73 y.o. M) Treating RN: Army Melia Primary Care Evalena Fujii: Glendon Axe Other Clinician: Referring Kyzer Blowe: Glendon Axe Treating Maidie Streight/Extender: STONE III, HOYT Weeks in Treatment: 29 Vital Signs Time Taken: 15:01 Temperature (F): 98.2 Height (in): 72 Pulse (bpm): 74 Weight (lbs): 250 Respiratory Rate (breaths/min): 16 Body Mass Index (BMI): 33.9 Blood Pressure (mmHg): 163/87 Reference Range: 80 - 120 mg / dl Electronic Signature(s) Signed: 07/04/2019 4:16:55 PM By: Army Melia Entered By: Army Melia on 07/04/2019 15:01:20

## 2019-07-06 NOTE — Progress Notes (Signed)
SALEEM, COCCIA ([id number]) Visit Report for 07/04/2019 Chief Complaint Document Details Patient Name: [name], [name]. Date of Service: 07/04/2019 3:00 PM Medical Record Number: [id number] Patient Account Number: [id number] Date of Birth/Sex: 02-25-1946 (73 y.o. M) Treating RN: Montey Hora Primary Care Provider: Glendon Axe Other Clinician: Referring Provider: Glendon Axe Treating Provider/Extender: STONE III, Dwane Andres Weeks in Treatment: 29 Information Obtained from: Patient Chief Complaint Left great toe ulcer Electronic Signature(s) Signed: 07/05/2019 1:30:00 AM By: Worthy Keeler PA-C Entered By: Worthy Keeler on 07/04/2019 15:06:43 [place], [name] ([id number]) -------------------------------------------------------------------------------- Debridement Details Patient Name: [name]. Date of Service: 07/04/2019 3:00 PM Medical Record Number: [id number] Patient Account Number: [id number] Date of Birth/Sex: 1946/10/10 (73 y.o. M) Treating RN: Montey Hora Primary Care Provider: Glendon Axe Other Clinician: Referring Provider: Glendon Axe Treating Provider/Extender: STONE III, Livio Ledwith Weeks in Treatment: 29 Debridement Performed for Wound #1R Left Toe Great Assessment: Performed By: Physician STONE III, Reverie Vaquera E., PA-C Debridement Type: Debridement Severity of Tissue Pre Fat layer exposed Debridement: Level of Consciousness (Pre- Awake and Alert procedure): Pre-procedure Verification/Time Yes - 15:11 Out Taken: Start Time: 15:11 Pain Control: Lidocaine 4% Topical Solution Total Area Debrided (L x W): 0.2 (cm) x 0.2 (cm) = 0.04 (cm) Tissue and other material Viable, Non-Viable, Callus, Slough, Subcutaneous, Slough debrided: Level: Skin/Subcutaneous Tissue Debridement Description: Excisional Instrument: Curette Bleeding: Minimum Hemostasis Achieved: Pressure End Time: 15:16 Procedural Pain: 0 Post Procedural Pain:  0 Response to Treatment: Procedure was tolerated well Level of Consciousness Awake and Alert (Post-procedure): Post Debridement Measurements of Total Wound Length: (cm) 0.3 Width: (cm) 0.4 Depth: (cm) 0.1 Volume: (cm) 0.009 Character of Wound/Ulcer Post Debridement: Improved Severity of Tissue Post Debridement: Fat layer exposed Post Procedure Diagnosis Same as Pre-procedure Electronic Signature(s) Signed: 07/04/2019 5:36:25 PM By: Montey Hora Signed: 07/05/2019 1:30:00 AM By: Worthy Keeler PA-C Entered By: Montey Hora on 07/04/2019 15:21:39 [place], [name] ([id number]) -------------------------------------------------------------------------------- HPI Details Patient Name: [name] Date of Service: 07/04/2019 3:00 PM Medical Record Number: [id number] Patient Account Number: [id number] Date of Birth/Sex: [birth year] (73 y.o. M) Treating RN: Montey Hora Primary Care Provider: Glendon Axe Other Clinician: Referring Provider: Glendon Axe Treating Provider/Extender: Melburn Hake, Denora Wysocki Weeks in Treatment: 29 History of Present Illness HPI Description: 12/12/18 on evaluation today patient presents as a referral from his endocrinologist regarding issues that he has been having with his left great toe. Subsequently he has been seeing Dr. Cleda Mccreedy and Dr. Cleda Mccreedy has been the breeding the wound and in fact this was debrided this morning. The patient had an appointment there prior to coming here. Subsequently he states that even though he's been going for regular debridement after office things really do not seem to be showing signs of improvement unfortunately. He states that he is having some discomfort but nothing too significant. No fevers, chills, nausea, or vomiting noted at this time. The patient does have a history of hypertension, neuropathy, diabetes mellitus type II, and a history of stroke. Currently he's been using bacitracin on the toe and utilizing a  Band-Aid. He tells me he's had this wound for two years. He cannot remember the last time he had an x-ray. 12/19/18 on evaluation today patient appears to be doing well in regard to his plantar toe ulcer. He's been tolerating the dressing changes without complication. Fortunately there does not appear to be signs of infection at this time which is good news. No fever chills noted. 01/19/19 has been one month since I last  saw the patient as he was out of town. With that being said on evaluation today it does appear that he is going to require sharp debridement and I do believe that the total contact cast would benefit him as far as being applied at this time. He is in agreement that plan. 01/23/19 on evaluation today patient appears to be doing better in regard to his left great toe ulcer. He has been shown signs of improvement week by week and although this is slow it does seem to be doing fairly well which is good news. Overall very pleased in this regard. 01/31/19 on evaluation today patient presents for follow-up concerning his great toe ulcer. He was actually supposed to be here yesterday and he did come however he ended up having to leave due to his blood sugar dropping he states he just did not eat enough lunch. Fortunately seems to be doing much better today which is excellent news. Fortunately there's no evidence of infection at this time which is also good news. No fevers, chills, nausea, or vomiting noted at this time. Overall I feel like he is making excellent progress. 02/06/19 on evaluation today patient actually appears to be doing very well in regard to his plantar toe ulcer. He's been tolerating the dressing changes without complication. Fortunately there is no sign of infection at this time. Overall been very pleased with how things seem to be progressing. No fevers, chills, nausea, or vomiting noted at this time. 02/13/19 on evaluation today patient actually appears to be doing  excellent in regard to his toe ulcer which is almost completely close. Overall very pleased with that. There does not appear to be any signs of infection which is good news. Upon close inspection it appears that he has just a very slight opening still noted at the central portion of the toe although this is minimal. 02/16/19 on evaluation today patient is seen for early follow-up due to the fact that he tells me while at home he actually got up to go answer the door without putting on the walking boot part of his cast and subsequently damaged the total contact cast. It therefore comes in to have this removed and potentially replaced. Fortunately other than it given him a little bit of pain its far as pushing in on some areas there doesn't appear to be any injury once the cast was removed. 03/13/19 on evaluation today patient unfortunately presents for follow-up concerning his great toe ulcer on the left. He was discharged on 02/16/19 with the wound healed at that point. He tells me this remain so for several weeks or least a couple weeks before reopening. Fortunately there does not appear to be any signs of infection at this time. Nonetheless for the past week at least he's had the wound reopened and states it is been trying to keep pressure off of this but he has not been using the offloading shoe we previously gave him. Fortunately there's no evidence of infection at this time. No fevers, chills, nausea, or vomiting noted at this time. DURON, MEISTER ([id number]) 03/17/19 on evaluation today patient actually appears to be doing rather well in regard to his toe also. Fact this appears to be significantly smaller this week even compared to what I saw last week on evaluation. He seems to have done very well for the offloading shoe and overall I'm very pleased with the progress that is made. It may be that he doesn't even need the cast to  get this to heal if he offloads this  appropriately. 03/24/19 on evaluation today patient actually appears to be doing well in regard to his plantar foot ulcer. He's been tolerating the dressing changes without complication the collagen does seem to be beneficial for him. With that being said he is gonna require some miles sharp debridement today to clear away some of the callous's around the edge of the wound as well as the minimal Slough noted on the surface of the wound. 03/31/19 on evaluation today patient appears to be doing well in regard to his plantar great toe ulcer. He has been tolerating the dressing changes without complication which is good news. Fortunately there does not appear to be any signs of active infection at this time. Overall very pleased with how things seem to be progressing. 04/11/19 on evaluation today patient's tools are appears to be doing in my pinion roughly about the same as were things that previous. Fortunately there does not appear to be signs of active infection at this time which is good news. With that being said he has not been experiencing any pain at this time which is good news there is also No fevers, chills, nausea, or vomiting noted at this time. 04/18/19 on evaluation today patient's wound actually appears to be doing fairly well today he did not even really have a lot of callous buildup. We have been debating on whether or not to reinitiate total contact cast. With that being said he seems to be doing fairly well this point with offloading in my pinion and again I think we need to come up with a way to get this healed that will also be sustainable for keeping it close. Again we were able to close it with the cast previous but again he has to be able to keep it such. For that reason we are working on trying to get him diabetic shoes he's waiting for the [place] to get in touch with the local company that has to measure him for these do not want to have a cast on at such a time as when they need  to actually measure him. 04/25/19 on evaluation today patient appears to be doing well in regard to his plantar foot specifically great toe ulcer. He's been tolerating the dressing changes without complication. Fortunately there's no signs of active infection at this time. Overall I'm very pleased with how things appear currently. 05/02/19 on evaluation today patient appears to actually be doing better in regard to his plantar foot ulcer. He shown signs of good improvement which is excellent news. She states he can walk better with the cast on the can with the offloading shoe that he had on previous. Fortunately there's no signs of active infection at this time. No fevers, chills, nausea, or vomiting noted at this time. 05/09/19 on evaluation today patient actually appears to be doing excellent. In fact he appears to be completely healed based on evaluation at this time. Fortunately there's no signs of infection and is having no discomfort. 06/08/19 this is a follow-up visit although the patient has been healed for almost a month but not quite. He tells me that in the past several days he noted some blood coming from his toe and he thought he should come in at this checked out. Fortunately he's having a pain. He also did get his custom orthotics shoes. With that being said he still appears to have developed this ulceration there is something he tells me that  the orthotic specialist stated they could do to the toes to try to help out in this regard although he did not know exactly what it was we may need to check with [facility]. 06/12/19 on evaluation today patient actually appears to be doing great in regard to his foot ulcer. The wound on his toes shown signs of excellent improvement which is great news. With that being said he is not completely close yet the cast has done wonders for him and he is definitely headed in the correct direction. No fevers, chills, nausea, or vomiting noted at  this time. 06/22/19 on evaluation today fortunately patient actually appears to be healing quite well and in fact appears to be completely healed this is excellent news. Fortunately there's no signs of active infection he's been tolerating the total contact cast without complication. Overall I'm pleased with the progress that has been made. He also has his new custom shoes he brought was with him today. 07/04/19 on evaluation today patient actually appears to be doing a little bit worse in regard his toe ulcer unfortunately this has yet again reopened. It has only been about a week and a half since I've last seen him I really did not expect this to reopen this quickly. Nonetheless I did discuss with him his normal routine in order to see what may be causing this. He has custom shoes. With that being said he apparently has not been using the custom shoes and even when he has used them he has not OSMANI, KERSTEN. ([id number]) even put the insert in there which is what was custom molded to him in order to appropriately offload high-pressure point areas. In fact his exact question to me was whether or not he needed to actually put those in they just came loose in the box and he had never installed them. Nonetheless it also came to light the he's been walking around in his bedroom slippers at home he tells me he does not go barefoot but he really has not been wearing his custom offloading/diabetic shoes. Nonetheless I do believe that this is why he continually reopens as far as the wound is concerned. I discussed this with the patient in great detail today for yet again although I previously had this discussion with him in the past. Electronic Signature(s) Signed: 07/05/2019 1:30:00 AM By: Worthy Keeler PA-C Entered By: Worthy Keeler on 07/05/2019 01:24:40 [place], [name] ([id number]) -------------------------------------------------------------------------------- Physical Exam  Details Patient Name: [name]. Date of Service: 07/04/2019 3:00 PM Medical Record Number: [id number] Patient Account Number: [id number] Date of Birth/Sex: Jul 22, 1946 (73 y.o. M) Treating RN: Montey Hora Primary Care Provider: Glendon Axe Other Clinician: Referring Provider: Glendon Axe Treating Provider/Extender: STONE III, Adrienne Delay Weeks in Treatment: 47 Constitutional Well-nourished and well-hydrated in no acute distress. Respiratory normal breathing without difficulty. clear to auscultation bilaterally. Cardiovascular regular rate and rhythm with normal S1, S2. Psychiatric this patient is able to make decisions and demonstrates good insight into disease process. Alert and Oriented x 3. pleasant and cooperative. Notes Upon inspection patient's wound bed did require sharp debridement today I was able to clear away some of the callous from the edge of the wound this revealed a larger opening than what was initially obvious. Post debridement however the wound bed appears to be doing better it's not too deep and it appears to be fairly healthy all of which is good news. Electronic Signature(s) Signed: 07/05/2019 1:30:00 AM By: Worthy Keeler PA-C  Entered By: Worthy Keeler on 07/05/2019 01:25:15 [name] ([id number]) -------------------------------------------------------------------------------- Physician Orders Details Patient Name: [name] Date of Service: 07/04/2019 3:00 PM Medical Record Number: [id number] Patient Account Number: [id number] Date of Birth/Sex: Apr 03, 1946 (74 y.o. M) Treating RN: Montey Hora Primary Care Provider: Glendon Axe Other Clinician: Referring Provider: Glendon Axe Treating Provider/Extender: Melburn Hake, Kyeshia Zinn Weeks in Treatment: 6 Verbal / Phone Orders: No Diagnosis Coding ICD-10 Coding Code Description E11.621 Type 2 diabetes mellitus with foot ulcer L97.522 Non-pressure chronic ulcer of other  part of left foot with fat layer exposed I10 Essential (primary) hypertension E11.43 Type 2 diabetes mellitus with diabetic autonomic (poly)neuropathy Wound Cleansing Wound #1R Left Toe Great o Clean wound with Normal Saline. o May shower with protection. - Do not get the cast wet Primary Wound Dressing Wound #1R Left Toe Great o Silver Collagen Secondary Dressing Wound #1R Left Toe Great o Conform/Kerlix o Foam Dressing Change Frequency Wound #1R Left Toe Great o Change dressing every week Follow-up Appointments Wound #1R Left Toe Great o Return Appointment in 1 week. Off-Loading Wound #1R Left Toe Great o Open toe surgical shoe with peg assist. Electronic Signature(s) Signed: 07/04/2019 5:36:25 PM By: Montey Hora Signed: 07/05/2019 1:30:00 AM By: Worthy Keeler PA-C Entered By: Montey Hora on 07/04/2019 15:19:43 [place], [name] ([id number]) -------------------------------------------------------------------------------- Problem List Details Patient Name: TODD, JELINSKI. Date of Service: 07/04/2019 3:00 PM Medical Record Number: [id number] Patient Account Number: [id number] Date of Birth/Sex: June 12, 1946 (73 y.o. M) Treating RN: Montey Hora Primary Care Provider: Glendon Axe Other Clinician: Referring Provider: Glendon Axe Treating Provider/Extender: Melburn Hake, Clotilde Loth Weeks in Treatment: 29 Active Problems ICD-10 Evaluated Encounter Code Description Active Date Today Diagnosis E11.621 Type 2 diabetes mellitus with foot ulcer 12/12/2018 No Yes L97.522 Non-pressure chronic ulcer of other part of left foot with fat 12/12/2018 No Yes layer exposed I10 Essential (primary) hypertension 12/12/2018 No Yes E11.43 Type 2 diabetes mellitus with diabetic autonomic (poly) 12/12/2018 No Yes neuropathy Inactive Problems Resolved Problems Electronic Signature(s) Signed: 07/05/2019 1:30:00 AM By: Worthy Keeler PA-C Entered By: Worthy Keeler  on 07/04/2019 15:06:31 [place], [name] ([id number]) -------------------------------------------------------------------------------- Progress Note Details Patient Name: [name] Date of Service: 07/04/2019 3:00 PM Medical Record Number: [id number] Patient Account Number: [id number] Date of Birth/Sex: January 29, 1946 (73 y.o. M) Treating RN: Montey Hora Primary Care Provider: Glendon Axe Other Clinician: Referring Provider: Glendon Axe Treating Provider/Extender: Melburn Hake, Ripley Bogosian Weeks in Treatment: 29 Subjective Chief Complaint Information obtained from Patient Left great toe ulcer History of Present Illness (HPI) 12/12/18 on evaluation today patient presents as a referral from his endocrinologist regarding issues that he has been having with his left great toe. Subsequently he has been seeing Dr. Cleda Mccreedy and Dr. Cleda Mccreedy has been the breeding the wound and in fact this was debrided this morning. The patient had an appointment there prior to coming here. Subsequently he states that even though he's been going for regular debridement after office things really do not seem to be showing signs of improvement unfortunately. He states that he is having some discomfort but nothing too significant. No fevers, chills, nausea, or vomiting noted at this time. The patient does have a history of hypertension, neuropathy, diabetes mellitus type II, and a history of stroke. Currently he's been using bacitracin on the toe and utilizing a Band-Aid. He tells me he's had this wound for two years. He cannot remember the last time he had an x-ray. 12/19/18 on evaluation today  patient appears to be doing well in regard to his plantar toe ulcer. He's been tolerating the dressing changes without complication. Fortunately there does not appear to be signs of infection at this time which is good news. No fever chills noted. 01/19/19 has been one month since I last saw the patient as he was out of  town. With that being said on evaluation today it does appear that he is going to require sharp debridement and I do believe that the total contact cast would benefit him as far as being applied at this time. He is in agreement that plan. 01/23/19 on evaluation today patient appears to be doing better in regard to his left great toe ulcer. He has been shown signs of improvement week by week and although this is slow it does seem to be doing fairly well which is good news. Overall very pleased in this regard. 01/31/19 on evaluation today patient presents for follow-up concerning his great toe ulcer. He was actually supposed to be here yesterday and he did come however he ended up having to leave due to his blood sugar dropping he states he just did not eat enough lunch. Fortunately seems to be doing much better today which is excellent news. Fortunately there's no evidence of infection at this time which is also good news. No fevers, chills, nausea, or vomiting noted at this time. Overall I feel like he is making excellent progress. 02/06/19 on evaluation today patient actually appears to be doing very well in regard to his plantar toe ulcer. He's been tolerating the dressing changes without complication. Fortunately there is no sign of infection at this time. Overall been very pleased with how things seem to be progressing. No fevers, chills, nausea, or vomiting noted at this time. 02/13/19 on evaluation today patient actually appears to be doing excellent in regard to his toe ulcer which is almost completely close. Overall very pleased with that. There does not appear to be any signs of infection which is good news. Upon close inspection it appears that he has just a very slight opening still noted at the central portion of the toe although this is minimal. 02/16/19 on evaluation today patient is seen for early follow-up due to the fact that he tells me while at home he actually got up to go answer the  door without putting on the walking boot part of his cast and subsequently damaged the total contact cast. It therefore comes in to have this removed and potentially replaced. Fortunately other than it given him a little bit of pain its far as pushing in on some areas there doesn't appear to be any injury once the cast was removed. KEITH, FELTEN ([id number]) 03/13/19 on evaluation today patient unfortunately presents for follow-up concerning his great toe ulcer on the left. He was discharged on 02/16/19 with the wound healed at that point. He tells me this remain so for several weeks or least a couple weeks before reopening. Fortunately there does not appear to be any signs of infection at this time. Nonetheless for the past week at least he's had the wound reopened and states it is been trying to keep pressure off of this but he has not been using the offloading shoe we previously gave him. Fortunately there's no evidence of infection at this time. No fevers, chills, nausea, or vomiting noted at this time. 03/17/19 on evaluation today patient actually appears to be doing rather well in regard to his toe also. Fact  this appears to be significantly smaller this week even compared to what I saw last week on evaluation. He seems to have done very well for the offloading shoe and overall I'm very pleased with the progress that is made. It may be that he doesn't even need the cast to get this to heal if he offloads this appropriately. 03/24/19 on evaluation today patient actually appears to be doing well in regard to his plantar foot ulcer. He's been tolerating the dressing changes without complication the collagen does seem to be beneficial for him. With that being said he is gonna require some miles sharp debridement today to clear away some of the callous's around the edge of the wound as well as the minimal Slough noted on the surface of the wound. 03/31/19 on evaluation today patient appears to  be doing well in regard to his plantar great toe ulcer. He has been tolerating the dressing changes without complication which is good news. Fortunately there does not appear to be any signs of active infection at this time. Overall very pleased with how things seem to be progressing. 04/11/19 on evaluation today patient's tools are appears to be doing in my pinion roughly about the same as were things that previous. Fortunately there does not appear to be signs of active infection at this time which is good news. With that being said he has not been experiencing any pain at this time which is good news there is also No fevers, chills, nausea, or vomiting noted at this time. 04/18/19 on evaluation today patient's wound actually appears to be doing fairly well today he did not even really have a lot of callous buildup. We have been debating on whether or not to reinitiate total contact cast. With that being said he seems to be doing fairly well this point with offloading in my pinion and again I think we need to come up with a way to get this healed that will also be sustainable for keeping it close. Again we were able to close it with the cast previous but again he has to be able to keep it such. For that reason we are working on trying to get him diabetic shoes he's waiting for the [facility] to get in touch with the local company that has to measure him for these do not want to have a cast on at such a time as when they need to actually measure him. 04/25/19 on evaluation today patient appears to be doing well in regard to his plantar foot specifically great toe ulcer. He's been tolerating the dressing changes without complication. Fortunately there's no signs of active infection at this time. Overall I'm very pleased with how things appear currently. 05/02/19 on evaluation today patient appears to actually be doing better in regard to his plantar foot ulcer. He shown signs of good improvement which is  excellent news. She states he can walk better with the cast on the can with the offloading shoe that he had on previous. Fortunately there's no signs of active infection at this time. No fevers, chills, nausea, or vomiting noted at this time. 05/09/19 on evaluation today patient actually appears to be doing excellent. In fact he appears to be completely healed based on evaluation at this time. Fortunately there's no signs of infection and is having no discomfort. 06/08/19 this is a follow-up visit although the patient has been healed for almost a month but not quite. He tells me that in the past several days  he noted some blood coming from his toe and he thought he should come in at this checked out. Fortunately he's having a pain. He also did get his custom orthotics shoes. With that being said he still appears to have developed this ulceration there is something he tells me that the orthotic specialist stated they could do to the toes to try to help out in this regard although he did not know exactly what it was we may need to [facility] with [facility]. 06/12/19 on evaluation today patient actually appears to be doing great in regard to his foot ulcer. The wound on his toes shown signs of excellent improvement which is great news. With that being said he is not completely close yet the cast has done wonders for him and he is definitely headed in the correct direction. No fevers, chills, nausea, or vomiting noted at this time. 06/22/19 on evaluation today fortunately patient actually appears to be healing quite well and in fact appears to be completely healed this is excellent news. Fortunately there's no signs of active infection he's been tolerating the total contact cast without complication. Overall I'm pleased with the progress that has been made. He also has his new custom shoes he brought was NEVAAN, BUNTON ([id number]) with him today. 07/04/19 on evaluation today patient actually appears  to be doing a little bit worse in regard his toe ulcer unfortunately this has yet again reopened. It has only been about a week and a half since I've last seen him I really did not expect this to reopen this quickly. Nonetheless I did discuss with him his normal routine in order to see what may be causing this. He has custom shoes. With that being said he apparently has not been using the custom shoes and even when he has used them he has not even put the insert in there which is what was custom molded to him in order to appropriately offload high-pressure point areas. In fact his exact question to me was whether or not he needed to actually put those in they just came loose in the box and he had never installed them. Nonetheless it also came to light the he's been walking around in his bedroom slippers at home he tells me he does not go barefoot but he really has not been wearing his custom offloading/diabetic shoes. Nonetheless I do believe that this is why he continually reopens as far as the wound is concerned. I discussed this with the patient in great detail today for yet again although I previously had this discussion with him in the past. Patient History Information obtained from Patient. Family History Diabetes - Mother, Heart Disease - Father, Stroke - Father, No family history of Cancer, Hereditary Spherocytosis, Hypertension, Kidney Disease, Lung Disease, Seizures, Thyroid Problems, Tuberculosis. Social History Never smoker, Marital Status - Widowed, Alcohol Use - Never, Drug Use - No History, Caffeine Use - Moderate. Medical History Eyes Denies history of Cataracts, Glaucoma, Optic Neuritis Ear/Nose/Mouth/Throat Denies history of Chronic sinus problems/congestion, Middle ear problems Hematologic/Lymphatic Denies history of Anemia, Hemophilia, Human Immunodeficiency Virus, Lymphedema Respiratory Denies history of Aspiration, Asthma, Chronic Obstructive Pulmonary Disease (COPD),  Pneumothorax, Sleep Apnea, Tuberculosis Cardiovascular Patient has history of Hypertension Denies history of Angina, Arrhythmia, Congestive Heart Failure, Coronary Artery Disease, Deep Vein Thrombosis, Hypotension, Myocardial Infarction, Peripheral Arterial Disease, Peripheral Venous Disease, Phlebitis, Vasculitis Gastrointestinal Denies history of Cirrhosis , Colitis, Crohn s, Hepatitis A, Hepatitis B, Hepatitis C Endocrine Patient has history of  Type II Diabetes Denies history of Type I Diabetes Genitourinary Denies history of End Stage Renal Disease Immunological Denies history of Lupus Erythematosus, Raynaud s, Scleroderma Integumentary (Skin) Denies history of History of Burn, History of pressure wounds Musculoskeletal Denies history of Gout, Rheumatoid Arthritis, Osteoarthritis, Osteomyelitis Neurologic Denies history of Dementia, Neuropathy, Quadriplegia, Paraplegia, Seizure Disorder Oncologic Denies history of Received Chemotherapy, Received Radiation Review of Systems (ROS) Constitutional Symptoms (General Health) Denies complaints or symptoms of Fatigue, Fever, Chills, Marked Weight Change. KINTA, MARTIS ([id number]) Respiratory Denies complaints or symptoms of Chronic or frequent coughs, Shortness of Breath. Cardiovascular Denies complaints or symptoms of Chest pain, LE edema. Psychiatric Denies complaints or symptoms of Anxiety, Claustrophobia. Objective Constitutional Well-nourished and well-hydrated in no acute distress. Vitals Time Taken: 3:01 PM, Height: 72 in, Weight: 250 lbs, BMI: 33.9, Temperature: 98.2 F, Pulse: 74 bpm, Respiratory Rate: 16 breaths/min, Blood Pressure: 163/87 mmHg. Respiratory normal breathing without difficulty. clear to auscultation bilaterally. Cardiovascular regular rate and rhythm with normal S1, S2. Psychiatric this patient is able to make decisions and demonstrates good insight into disease process. Alert and Oriented x  3. pleasant and cooperative. General Notes: Upon inspection patient's wound bed did require sharp debridement today I was able to clear away some of the callous from the edge of the wound this revealed a larger opening than what was initially obvious. Post debridement however the wound bed appears to be doing better it's not too deep and it appears to be fairly healthy all of which is good news. Integumentary (Hair, Skin) Wound #1R status is Open. Original cause of wound was Gradually Appeared. The wound is located on the Left Toe Great. The wound measures 0.2cm length x 0.2cm width x 0.1cm depth; 0.031cm^2 area and 0.003cm^3 volume. There is Fat Layer (Subcutaneous Tissue) Exposed exposed. There is no tunneling or undermining noted. There is a none present amount of drainage noted. The wound margin is thickened. There is large (67-100%) granulation within the wound bed. There is a small (1-33%) amount of necrotic tissue within the wound bed including Adherent Slough. Assessment Active Problems ICD-10 Type 2 diabetes mellitus with foot ulcer Non-pressure chronic ulcer of other part of left foot with fat layer exposed Essential (primary) hypertension SHERARD, SUTCH ([id number]) Type 2 diabetes mellitus with diabetic autonomic (poly)neuropathy Procedures Wound #1R Pre-procedure diagnosis of Wound #1R is a Diabetic Wound/Ulcer of the Lower Extremity located on the Left Toe Great .Severity of Tissue Pre Debridement is: Fat layer exposed. There was a Excisional Skin/Subcutaneous Tissue Debridement with a total area of 0.04 sq cm performed by STONE III, Dodger Sinning E., PA-C. With the following instrument(s): Curette to remove Viable and Non-Viable tissue/material. Material removed includes Callus, Subcutaneous Tissue, and Slough after achieving pain control using Lidocaine 4% Topical Solution. No specimens were taken. A time out was conducted at 15:11, prior to the start of the procedure. A  Minimum amount of bleeding was controlled with Pressure. The procedure was tolerated well with a pain level of 0 throughout and a pain level of 0 following the procedure. Post Debridement Measurements: 0.3cm length x 0.4cm width x 0.1cm depth; 0.009cm^3 volume. Character of Wound/Ulcer Post Debridement is improved. Severity of Tissue Post Debridement is: Fat layer exposed. Post procedure Diagnosis Wound #1R: Same as Pre-Procedure Plan Wound Cleansing: Wound #1R Left Toe Great: Clean wound with Normal Saline. May shower with protection. - Do not get the cast wet Primary Wound Dressing: Wound #1R Left Toe Great: Silver Collagen Secondary Dressing: Wound #  1R Left Toe Great: Conform/Kerlix Foam Dressing Change Frequency: Wound #1R Left Toe Great: Change dressing every week Follow-up Appointments: Wound #1R Left Toe Great: Return Appointment in 1 week. Off-Loading: Wound #1R Left Toe Great: Open toe surgical shoe with peg assist. My suggestion at this time is gonna be that we go ahead and initiate the above wound care measures for the next week. I again reiterated with the patient that he needs to utilize his offloading/custom shoes on an everyday basis all the time. He should not be wearing his normal tissues which is what he actually came into the office today with at this point. He voiced understanding. Will see were things stand at follow-up. MONISH, HALIBURTON ([id number]) Please see above for specific wound care orders. We will see patient for re-evaluation in 1 week(s) here in the clinic. If anything worsens or changes patient will contact our office for additional recommendations. Electronic Signature(s) Signed: 07/05/2019 1:30:00 AM By: Worthy Keeler PA-C Entered By: Worthy Keeler on 07/05/2019 01:25:38 [name] ([id number]) -------------------------------------------------------------------------------- ROS/PFSH Details Patient Name: [name] Date of Service: 07/04/2019 3:00 PM Medical Record Number: [id number] Patient Account Number: [id number] Date of Birth/Sex: 1946-09-02 (73 y.o. M) Treating RN: Montey Hora Primary Care Provider: Glendon Axe Other Clinician: Referring Provider: Glendon Axe Treating Provider/Extender: STONE III, Goro Wenrick Weeks in Treatment: 29 Information Obtained From Patient Constitutional Symptoms (General Health) Complaints and Symptoms: Negative for: Fatigue; Fever; Chills; Marked Weight Change Respiratory Complaints and Symptoms: Negative for: Chronic or frequent coughs; Shortness of Breath Medical History: Negative for: Aspiration; Asthma; Chronic Obstructive Pulmonary Disease (COPD); Pneumothorax; Sleep Apnea; Tuberculosis Cardiovascular Complaints and Symptoms: Negative for: Chest pain; LE edema Medical History: Positive for: Hypertension Negative for: Angina; Arrhythmia; Congestive Heart Failure; Coronary Artery Disease; Deep Vein Thrombosis; Hypotension; Myocardial Infarction; Peripheral Arterial Disease; Peripheral Venous Disease; Phlebitis; Vasculitis Psychiatric Complaints and Symptoms: Negative for: Anxiety; Claustrophobia Eyes Medical History: Negative for: Cataracts; Glaucoma; Optic Neuritis Ear/Nose/Mouth/Throat Medical History: Negative for: Chronic sinus problems/congestion; Middle ear problems Hematologic/Lymphatic Medical History: Negative for: Anemia; Hemophilia; Human Immunodeficiency Virus; Lymphedema Gastrointestinal CLEMON, DEVAUL ([id number]) Medical History: Negative for: Cirrhosis ; Colitis; Crohnos; Hepatitis A; Hepatitis B; Hepatitis C Endocrine Medical History: Positive for: Type II Diabetes Negative for: Type I Diabetes Time with diabetes: 1990 Treated with: Insulin Blood sugar tested every day: Yes Tested : Genitourinary Medical History: Negative for: End Stage Renal Disease Immunological Medical History: Negative for:  Lupus Erythematosus; Raynaudos; Scleroderma Integumentary (Skin) Medical History: Negative for: History of Burn; History of pressure wounds Musculoskeletal Medical History: Negative for: Gout; Rheumatoid Arthritis; Osteoarthritis; Osteomyelitis Neurologic Medical History: Negative for: Dementia; Neuropathy; Quadriplegia; Paraplegia; Seizure Disorder Oncologic Medical History: Negative for: Received Chemotherapy; Received Radiation Immunizations Pneumococcal Vaccine: Received Pneumococcal Vaccination: Yes Implantable Devices No devices added Family and Social History Cancer: No; Diabetes: Yes - Mother; Heart Disease: Yes - Father; Hereditary Spherocytosis: No; Hypertension: No; Kidney Disease: No; Lung Disease: No; Seizures: No; Stroke: Yes - Father; Thyroid Problems: No; Tuberculosis: No; Never smoker; Marital Status - Widowed; Alcohol Use: Never; Drug Use: No History; Caffeine Use: Moderate; Financial Concerns: No; Food, Clothing or Shelter Needs: No; Support System Lacking: No; Transportation Concerns: No Physician Affirmation I have reviewed and agree with the above information. GEVORK, AYYAD ([id number]) Electronic Signature(s) Signed: 07/05/2019 1:30:00 AM By: Worthy Keeler PA-C Signed: 07/06/2019 4:02:59 PM By: Montey Hora Entered By: Worthy Keeler on 07/05/2019 01:25:03 [name] ([id number]) -------------------------------------------------------------------------------- SuperBill Details Patient Name: Margretta Ditty,  Treyven L. Date of Service: 07/04/2019 Medical Record Number: [id number] Patient Account Number: [id number] Date of Birth/Sex: 11/10/1946 (73 y.o. M) Treating RN: Montey Hora Primary Care Provider: Glendon Axe Other Clinician: Referring Provider: Glendon Axe Treating Provider/Extender: STONE III, Daylan Boggess Weeks in Treatment: 29 Diagnosis Coding ICD-10 Codes Code Description E11.621 Type 2 diabetes mellitus with foot  ulcer L97.522 Non-pressure chronic ulcer of other part of left foot with fat layer exposed I10 Essential (primary) hypertension E11.43 Type 2 diabetes mellitus with diabetic autonomic (poly)neuropathy Facility Procedures CPT4 Code: [id number] Description: 85462 - DEB SUBQ TISSUE 20 SQ CM/< ICD-10 Diagnosis Description L97.522 Non-pressure chronic ulcer of other part of left foot with fat Modifier: layer exposed Quantity: 1 Physician Procedures CPT4 Code: [id number] Description: 11042 - WC PHYS SUBQ TISS 20 SQ CM ICD-10 Diagnosis Description L97.522 Non-pressure chronic ulcer of other part of left foot with fat Modifier: layer exposed Quantity: 1 Electronic Signature(s) Signed: 07/05/2019 1:30:00 AM By: Worthy Keeler PA-C Entered By: Worthy Keeler on 07/04/2019 23:57:56

## 2019-07-11 ENCOUNTER — Encounter: Payer: Medicare Other | Admitting: Internal Medicine

## 2019-07-11 ENCOUNTER — Other Ambulatory Visit: Payer: Self-pay

## 2019-07-11 DIAGNOSIS — E11621 Type 2 diabetes mellitus with foot ulcer: Secondary | ICD-10-CM | POA: Diagnosis not present

## 2019-07-12 NOTE — Progress Notes (Signed)
Fred, [name] ([id number]) Visit Report for 07/11/2019 Arrival Information Details Patient Name: [name], [name]. Date of Service: 07/11/2019 3:45 PM Medical Record Number: [id number] Patient Account Number: [id number] Date of Birth/Sex: 1946-11-29 (73 y.o. M) Treating RN: Army Melia Primary Care Paulette Lynch: Glendon Axe Other Clinician: Referring Lavere Shinsky: Glendon Axe Treating Arnelle Nale/Extender: Beverly Gust in Treatment: 63 Visit Information History Since Last Visit Added or deleted any medications: No Patient Arrived: Cane Any new allergies or adverse reactions: No Arrival Time: 15:48 Had a fall or experienced change in No Accompanied By: self activities of daily living that may affect Transfer Assistance: None risk of falls: Patient Has Alerts: Yes Signs or symptoms of abuse/neglect since last visito No Patient Alerts: DMII Hospitalized since last visit: No Has Dressing in Place as Prescribed: Yes Pain Present Now: No Electronic Signature(s) Signed: 07/11/2019 4:08:56 PM By: Army Melia Entered By: Army Melia on 07/11/2019 15:48:49 [name], [name] ([id number]) -------------------------------------------------------------------------------- Clinic Level of Care Assessment Details Patient Name: [name] Date of Service: 07/11/2019 3:45 PM Medical Record Number: [id number] Patient Account Number: [id number] Date of Birth/Sex: 09-29-1946 (73 y.o. M) Treating RN: Montey Hora Primary Care Brigett Estell: Glendon Axe Other Clinician: Referring Boyce Keltner: Glendon Axe Treating Marimar Suber/Extender: Beverly Gust in Treatment: 30 Clinic Level of Care Assessment Items TOOL 4 Quantity Score []  - Use when only an EandM is performed on FOLLOW-UP visit 0 ASSESSMENTS - Nursing Assessment / Reassessment X - Reassessment of Co-morbidities (includes updates in patient status) 1 10 X- 1 5 Reassessment of Adherence to Treatment  Plan ASSESSMENTS - Wound and Skin Assessment / Reassessment X - Simple Wound Assessment / Reassessment - one wound 1 5 []  - 0 Complex Wound Assessment / Reassessment - multiple wounds []  - 0 Dermatologic / Skin Assessment (not related to wound area) ASSESSMENTS - Focused Assessment []  - Circumferential Edema Measurements - multi extremities 0 []  - 0 Nutritional Assessment / Counseling / Intervention X- 1 5 Lower Extremity Assessment (monofilament, tuning fork, pulses) []  - 0 Peripheral Arterial Disease Assessment (using hand held doppler) ASSESSMENTS - Ostomy and/or Continence Assessment and Care []  - Incontinence Assessment and Management 0 []  - 0 Ostomy Care Assessment and Management (repouching, etc.) PROCESS - Coordination of Care X - Simple Patient / Family Education for ongoing care 1 15 []  - 0 Complex (extensive) Patient / Family Education for ongoing care X- 1 10 Staff obtains Programmer, systems, Records, Test Results / Process Orders []  - 0 Staff telephones HHA, Nursing Homes / Clarify orders / etc []  - 0 Routine Transfer to another Facility (non-emergent condition) []  - 0 Routine Hospital Admission (non-emergent condition) []  - 0 New Admissions / Biomedical engineer / Ordering NPWT, Apligraf, etc. []  - 0 Emergency Hospital Admission (emergent condition) X- 1 10 Simple Discharge Coordination Fred, [name] ([id number]) []  - 0 Complex (extensive) Discharge Coordination PROCESS - Special Needs []  - Pediatric / Minor Patient Management 0 []  - 0 Isolation Patient Management []  - 0 Hearing / Language / Visual special needs []  - 0 Assessment of Community assistance (transportation, D/C planning, etc.) []  - 0 Additional assistance / Altered mentation []  - 0 Support Surface(s) Assessment (bed, cushion, seat, etc.) INTERVENTIONS - Wound Cleansing / Measurement X - Simple Wound Cleansing - one wound 1 5 []  - 0 Complex Wound Cleansing - multiple wounds X- 1  5 Wound Imaging (photographs - any number of wounds) []  - 0 Wound Tracing (instead of photographs) X- 1 5 Simple Wound Measurement - one wound []  -  0 Complex Wound Measurement - multiple wounds INTERVENTIONS - Wound Dressings X - Small Wound Dressing one or multiple wounds 1 10 []  - 0 Medium Wound Dressing one or multiple wounds []  - 0 Large Wound Dressing one or multiple wounds []  - 0 Application of Medications - topical []  - 0 Application of Medications - injection INTERVENTIONS - Miscellaneous []  - External ear exam 0 []  - 0 Specimen Collection (cultures, biopsies, blood, body fluids, etc.) []  - 0 Specimen(s) / Culture(s) sent or taken to Lab for analysis []  - 0 Patient Transfer (multiple staff / Harrel Lemon Lift / Similar devices) []  - 0 Simple Staple / Suture removal (25 or less) []  - 0 Complex Staple / Suture removal (26 or more) []  - 0 Hypo / Hyperglycemic Management (close monitor of Blood Glucose) []  - 0 Ankle / Brachial Index (ABI) - do not check if billed separately X- 1 5 Vital Signs [name] ([id number]) Has the patient been seen at the hospital within the last three years: Yes Total Score: 90 Level Of Care: New/Established - Level 3 Electronic Signature(s) Signed: 07/11/2019 4:43:58 PM By: Montey Hora Entered By: Montey Hora on 07/11/2019 15:57:34 [name], [name] ([id number]) -------------------------------------------------------------------------------- Encounter Discharge Information Details Patient Name: [name]. Date of Service: 07/11/2019 3:45 PM Medical Record Number: [id number] Patient Account Number: [id number] Date of Birth/Sex: 1946/05/14 (73 y.o. M) Treating RN: Montey Hora Primary Care Chatham Howington: Glendon Axe Other Clinician: Referring Andraya Frigon: Glendon Axe Treating Willow Shidler/Extender: Beverly Gust in Treatment: 30 Encounter Discharge Information Items Discharge Condition:  Stable Ambulatory Status: Cane Discharge Destination: Home Transportation: Private Auto Accompanied By: self Schedule Follow-up Appointment: Yes Clinical Summary of Care: Electronic Signature(s) Signed: 07/11/2019 4:43:58 PM By: Montey Hora Entered By: Montey Hora on 07/11/2019 [place], [place] ([id number]) -------------------------------------------------------------------------------- Lower Extremity Assessment Details Patient Name: [name]. Date of Service: 07/11/2019 3:45 PM Medical Record Number: [id number] Patient Account Number: [id number] Date of Birth/Sex: 07-28-1946 (73 y.o. M) Treating RN: Army Melia Primary Care Victoire Deans: Glendon Axe Other Clinician: Referring Stefhanie Kachmar: Glendon Axe Treating Kamoni Depree/Extender: Beverly Gust in Treatment: 30 Edema Assessment Assessed: [Left: No] [Right: No] Edema: [Left: N] [Right: o] Vascular Assessment Pulses: Dorsalis Pedis Palpable: [Left:Yes] Electronic Signature(s) Signed: 07/11/2019 4:08:56 PM By: Army Melia Entered By: Army Melia on 07/11/2019 15:51:53 [name], [name] ([id number]) -------------------------------------------------------------------------------- Multi Wound Chart Details Patient Name: [name]. Date of Service: 07/11/2019 3:45 PM Medical Record Number: [id number] Patient Account Number: [id number] Date of Birth/Sex: 1946-09-05 (73 y.o. M) Treating RN: Montey Hora Primary Care Laval Cafaro: Glendon Axe Other Clinician: Referring Yuleidy Rappleye: Glendon Axe Treating Irelyn Perfecto/Extender: Beverly Gust in Treatment: 30 Vital Signs Height(in): 72 Pulse(bpm): 85 Weight(lbs): 250 Blood Pressure(mmHg): 160/80 Body Mass Index(BMI): 34 Temperature(F): 98.5 Respiratory Rate 16 (breaths/min): Photos: [N/A:N/A] Wound Location: Left Toe Great N/A N/A Wounding Event: Gradually Appeared N/A N/A Primary Etiology: Diabetic Wound/Ulcer of  the N/A N/A Lower Extremity Comorbid History: Hypertension, Type II Diabetes N/A N/A Date Acquired: 11/30/2017 N/A N/A Weeks of Treatment: 30 N/A N/A Wound Status: Open N/A N/A Wound Recurrence: Yes N/A N/A Measurements L x W x D 0.3x0.4x0.1 N/A N/A (cm) Area (cm) : 0.094 N/A N/A Volume (cm) : 0.009 N/A N/A % Reduction in Area: 85.20% N/A N/A % Reduction in Volume: 85.90% N/A N/A Classification: Grade 2 N/A N/A Exudate Amount: None Present N/A N/A Wound Margin: Thickened N/A N/A Granulation Amount: Large (67-100%) N/A N/A Necrotic Amount: Small (1-33%) N/A N/A Exposed Structures: Fat Layer (Subcutaneous  N/A N/A Tissue) Exposed: Yes Fascia: No Tendon: No Muscle: No Joint: No Bone: No Epithelialization: Small (1-33%) N/A N/A [name], [name] ([id number]) Treatment Notes Electronic Signature(s) Signed: 07/11/2019 4:43:58 PM By: Montey Hora Entered By: Montey Hora on 07/11/2019 15:55:43 [name], [name] ([id number]) -------------------------------------------------------------------------------- [place] Details Patient Name: [name]. Date of Service: 07/11/2019 3:45 PM Medical Record Number: [id number] Patient Account Number: [id number] Date of Birth/Sex: 1946-10-05 (73 y.o. M) Treating RN: Montey Hora Primary Care Yoselin Amerman: Glendon Axe Other Clinician: Referring Finnlee Silvernail: Glendon Axe Treating Graycee Greeson/Extender: Beverly Gust in Treatment: 30 Active Inactive Abuse / Safety / Falls / Self Care Management Nursing Diagnoses: Potential for falls Goals: Patient will remain injury free related to falls Date Initiated: 01/19/2019 Target Resolution Date: 07/29/2019 Goal Status: Active Interventions: Assess fall risk on admission and as needed Notes: Orientation to the Wound Care Program Nursing Diagnoses: Knowledge deficit related to the wound healing center program Goals: Patient/caregiver will  verbalize understanding of the [place] Program Date Initiated: 01/19/2019 Target Resolution Date: 08/11/2019 Goal Status: Active Interventions: Provide education on orientation to the wound center Notes: Peripheral Neuropathy Nursing Diagnoses: Knowledge deficit related to disease process and management of peripheral neurovascular dysfunction Goals: Patient/caregiver will verbalize understanding of disease process and disease management Date Initiated: 03/17/2019 Target Resolution Date: 08/26/2019 Goal Status: Active Interventions: Assess signs and symptoms of neuropathy upon admission and as needed [name], [name] ([id number]) Provide education on Management of Neuropathy and Related Ulcers Notes: Wound/Skin Impairment Nursing Diagnoses: Impaired tissue integrity Goals: Ulcer/skin breakdown will have a volume reduction of 30% by week 4 Date Initiated: 12/12/2018 Target Resolution Date: 08/26/2019 Goal Status: Active Interventions: Assess patient/caregiver ability to obtain necessary supplies Assess patient/caregiver ability to perform ulcer/skin care regimen upon admission and as needed Notes: Electronic Signature(s) Signed: 07/11/2019 4:43:58 PM By: Montey Hora Entered By: Montey Hora on 07/11/2019 15:55:35 [name], [name] ([id number]) -------------------------------------------------------------------------------- Pain Assessment Details Patient Name: [name]. Date of Service: 07/11/2019 3:45 PM Medical Record Number: [id number] Patient Account Number: [id number] Date of Birth/Sex: 10-03-1946 (73 y.o. M) Treating RN: Army Melia Primary Care Dex Blakely: Glendon Axe Other Clinician: Referring Aayan Haskew: Glendon Axe Treating Zamiah Tollett/Extender: Beverly Gust in Treatment: 30 Active Problems Location of Pain Severity and Description of Pain Patient Has Paino No Site Locations Pain Management and Medication Current  Pain Management: Electronic Signature(s) Signed: 07/11/2019 4:08:56 PM By: Army Melia Entered By: Army Melia on 07/11/2019 15:48:56 [place], [name] ([id number]) -------------------------------------------------------------------------------- Patient/Caregiver Education Details Patient Name: [name]. Date of Service: 07/11/2019 3:45 PM Medical Record Number: [id number] Patient Account Number: [id number] Date of Birth/Gender: 05-18-1946 (73 y.o. M) Treating RN: Montey Hora Primary Care Physician: Glendon Axe Other Clinician: Referring Physician: Glendon Axe Treating Physician/Extender: Beverly Gust in Treatment: 30 Education Assessment Education Provided To: Patient Education Topics Provided Wound/Skin Impairment: Handouts: Other: wound care as ordered Methods: Demonstration, Explain/Verbal Responses: State content correctly Electronic Signature(s) Signed: 07/11/2019 4:43:58 PM By: Montey Hora Entered By: Montey Hora on 07/11/2019 15:57:52 [name], [name] ([id number]) -------------------------------------------------------------------------------- Wound Assessment Details Patient Name: [name]. Date of Service: 07/11/2019 3:45 PM Medical Record Number: [id number] Patient Account Number: [id number] Date of Birth/Sex: 1946-06-20 (73 y.o. M) Treating RN: Army Melia Primary Care Issiah Huffaker: Glendon Axe Other Clinician: Referring Criss Bartles: Glendon Axe Treating Vere Diantonio/Extender: Beverly Gust in Treatment: 30 Wound Status Wound Number: 1R Primary Etiology: Diabetic Wound/Ulcer of the Lower Extremity Wound Location: Left Toe Great Wound Status: Open Wounding Event: Gradually  Appeared Comorbid Hypertension, Type II Diabetes Date Acquired: 11/30/2017 History: Weeks Of Treatment: 30 Clustered Wound: No Photos Wound Measurements Length: (cm) 0.3 % Reduction in Width: (cm) 0.4 % Reduction in Depth:  (cm) 0.1 Epithelializati Area: (cm) 0.094 Tunneling: Volume: (cm) 0.009 Undermining: Area: 85.2% Volume: 85.9% on: Small (1-33%) No No Wound Description Classification: Grade 2 Foul Odor After Wound Margin: Thickened Slough/Fibrino Exudate Amount: None Present Cleansing: No Yes Wound Bed Granulation Amount: Large (67-100%) Exposed Structure Necrotic Amount: Small (1-33%) Fascia Exposed: No Necrotic Quality: Adherent Slough Fat Layer (Subcutaneous Tissue) Exposed: Yes Tendon Exposed: No Muscle Exposed: No Joint Exposed: No Bone Exposed: No Treatment Notes Wound #1R (Left Toe Great) Notes [name], [name] ([id number]) prisma, foam, conform Electronic Signature(s) Signed: 07/11/2019 4:08:56 PM By: Army Melia Entered By: Army Melia on 07/11/2019 15:51:24 [name], [name] ([id number]) -------------------------------------------------------------------------------- Vitals Details Patient Name: [name]. Date of Service: 07/11/2019 3:45 PM Medical Record Number: [id number] Patient Account Number: [id number] Date of Birth/Sex: January 27, 1946 (73 y.o. M) Treating RN: Army Melia Primary Care Gen Clagg: Glendon Axe Other Clinician: Referring Tattianna Schnarr: Glendon Axe Treating Verania Salberg/Extender: Beverly Gust in Treatment: 30 Vital Signs Time Taken: 15:49 Temperature (F): 98.5 Height (in): 72 Pulse (bpm): 85 Weight (lbs): 250 Respiratory Rate (breaths/min): 16 Body Mass Index (BMI): 33.9 Blood Pressure (mmHg): 160/80 Reference Range: 80 - 120 mg / dl Electronic Signature(s) Signed: 07/11/2019 4:08:56 PM By: Army Melia Entered By: Army Melia on 07/11/2019 15:49:14

## 2019-07-12 NOTE — Progress Notes (Signed)
KAZIM, CORRALES ([id number]) Visit Report for 07/11/2019 HPI Details Patient Name: Fred, [name]. Date of Service: 07/11/2019 3:45 PM Medical Record Number: [id number] Patient Account Number: [id number] Date of Birth/Sex: 09/11/1946 (73 y.o. M) Treating RN: Montey Hora Primary Care Provider: Glendon Axe Other Clinician: Referring Provider: Glendon Axe Treating Provider/Extender: Beverly Gust in Treatment: 30 History of Present Illness HPI Description: 12/12/18 on evaluation today patient presents as a referral from his endocrinologist regarding issues that he has been having with his left great toe. Subsequently he has been seeing Dr. Cleda Mccreedy and Dr. Cleda Mccreedy has been the breeding the wound and in fact this was debrided this morning. The patient had an appointment there prior to coming here. Subsequently he states that even though he's been going for regular debridement after office things really do not seem to be showing signs of improvement unfortunately. He states that he is having some discomfort but nothing too significant. No fevers, chills, nausea, or vomiting noted at this time. The patient does have a history of hypertension, neuropathy, diabetes mellitus type II, and a history of stroke. Currently he's been using bacitracin on the toe and utilizing a Band-Aid. He tells me he's had this wound for two years. He cannot remember the last time he had an x-ray. 12/19/18 on evaluation today patient appears to be doing well in regard to his plantar toe ulcer. He's been tolerating the dressing changes without complication. Fortunately there does not appear to be signs of infection at this time which is good news. No fever chills noted. 01/19/19 has been one month since I last saw the patient as he was out of town. With that being said on evaluation today it does appear that he is going to require sharp debridement and I do believe that the total contact cast would  benefit him as far as being applied at this time. He is in agreement that plan. 01/23/19 on evaluation today patient appears to be doing better in regard to his left great toe ulcer. He has been shown signs of improvement week by week and although this is slow it does seem to be doing fairly well which is good news. Overall very pleased in this regard. 01/31/19 on evaluation today patient presents for follow-up concerning his great toe ulcer. He was actually supposed to be here yesterday and he did come however he ended up having to leave due to his blood sugar dropping he states he just did not eat enough lunch. Fortunately seems to be doing much better today which is excellent news. Fortunately there's no evidence of infection at this time which is also good news. No fevers, chills, nausea, or vomiting noted at this time. Overall I feel like he is making excellent progress. 02/06/19 on evaluation today patient actually appears to be doing very well in regard to his plantar toe ulcer. He's been tolerating the dressing changes without complication. Fortunately there is no sign of infection at this time. Overall been very pleased with how things seem to be progressing. No fevers, chills, nausea, or vomiting noted at this time. 02/13/19 on evaluation today patient actually appears to be doing excellent in regard to his toe ulcer which is almost completely close. Overall very pleased with that. There does not appear to be any signs of infection which is good news. Upon close inspection it appears that he has just a very slight opening still noted at the central portion of the toe although this is minimal. 02/16/19 on evaluation  today patient is seen for early follow-up due to the fact that he tells me while at home he actually got up to go answer the door without putting on the walking boot part of his cast and subsequently damaged the total contact cast. It therefore comes in to have this removed and  potentially replaced. Fortunately other than it given him a little bit of pain its far as pushing in on some areas there doesn't appear to be any injury once the cast was removed. 03/13/19 on evaluation today patient unfortunately presents for follow-up concerning his great toe ulcer on the left. He was discharged on 02/16/19 with the wound healed at that point. He tells me this remain so for several weeks or least a couple Fred, [name]. ([id number]) weeks before reopening. Fortunately there does not appear to be any signs of infection at this time. Nonetheless for the past week at least he's had the wound reopened and states it is been trying to keep pressure off of this but he has not been using the offloading shoe we previously gave him. Fortunately there's no evidence of infection at this time. No fevers, chills, nausea, or vomiting noted at this time. 03/17/19 on evaluation today patient actually appears to be doing rather well in regard to his toe also. Fact this appears to be significantly smaller this week even compared to what I saw last week on evaluation. He seems to have done very well for the offloading shoe and overall I'm very pleased with the progress that is made. It may be that he doesn't even need the cast to get this to heal if he offloads this appropriately. 03/24/19 on evaluation today patient actually appears to be doing well in regard to his plantar foot ulcer. He's been tolerating the dressing changes without complication the collagen does seem to be beneficial for him. With that being said he is gonna require some miles sharp debridement today to clear away some of the callous's around the edge of the wound as well as the minimal Slough noted on the surface of the wound. 03/31/19 on evaluation today patient appears to be doing well in regard to his plantar great toe ulcer. He has been tolerating the dressing changes without complication which is good news. Fortunately  there does not appear to be any signs of active infection at this time. Overall very pleased with how things seem to be progressing. 04/11/19 on evaluation today patient's tools are appears to be doing in my pinion roughly about the same as were things that previous. Fortunately there does not appear to be signs of active infection at this time which is good news. With that being said he has not been experiencing any pain at this time which is good news there is also No fevers, chills, nausea, or vomiting noted at this time. 04/18/19 on evaluation today patient's wound actually appears to be doing fairly well today he did not even really have a lot of callous buildup. We have been debating on whether or not to reinitiate total contact cast. With that being said he seems to be doing fairly well this point with offloading in my pinion and again I think we need to come up with a way to get this healed that will also be sustainable for keeping it close. Again we were able to close it with the cast previous but again he has to be able to keep it such. For that reason we are working on trying to  get him diabetic shoes [facility] with the local company that has to measure him for these do not want to have a cast on at such a time as when they need to actually measure him. 04/25/19 on evaluation today patient appears to be doing well in regard to his plantar foot specifically great toe ulcer. He's been tolerating the dressing changes without complication. Fortunately there's no signs of active infection at this time. Overall I'm very pleased with how things appear currently. 05/02/19 on evaluation today patient appears to actually be doing better in regard to his plantar foot ulcer. He shown signs of good improvement which is excellent news. She states he can walk better with the cast on the can with the offloading shoe that he had on previous. Fortunately there's no signs of active  infection at this time. No fevers, chills, nausea, or vomiting noted at this time. 05/09/19 on evaluation today patient actually appears to be doing excellent. In fact he appears to be completely healed based on evaluation at this time. Fortunately there's no signs of infection and is having no discomfort. 06/08/19 this is a follow-up visit although the patient has been healed for almost a month but not quite. He tells me that in the past several days he noted some blood coming from his toe and he thought he should come in at this checked out. Fortunately he's having a pain. He also did get his custom orthotics shoes. With that being said he still appears to have developed this ulceration there is something he tells me that the orthotic specialist stated they could do to the toes to try to help out in this regard although he did not know exactly what it was we may need to [facility] with [facility]. 06/12/19 on evaluation today patient actually appears to be doing great in regard to his foot ulcer. The wound on his toes shown signs of excellent improvement which is great news. With that being said he is not completely close yet the cast has done wonders for him and he is definitely headed in the correct direction. No fevers, chills, nausea, or vomiting noted at this time. 06/22/19 on evaluation today fortunately patient actually appears to be healing quite well and in fact appears to be completely healed this is excellent news. Fortunately there's no signs of active infection he's been tolerating the total contact cast without complication. Overall I'm pleased with the progress that has been made. He also has his new custom shoes he brought was with him today. Fred, [name] ([id number]) 07/04/19 on evaluation today patient actually appears to be doing a little bit worse in regard his toe ulcer unfortunately this has yet again reopened. It has only been about a week and a half since I've last seen  him I really did not expect this to reopen this quickly. Nonetheless I did discuss with him his normal routine in order to see what may be causing this. He has custom shoes. With that being said he apparently has not been using the custom shoes and even when he has used them he has not even put the insert in there which is what was custom molded to him in order to appropriately offload high-pressure point areas. In fact his exact question to me was whether or not he needed to actually put those in they just came loose in the box and he had never installed them. Nonetheless it also came to  light the he's been walking around in his bedroom slippers at home he tells me he does not go barefoot but he really has not been wearing his custom offloading/diabetic shoes. Nonetheless I do believe that this is why he continually reopens as far as the wound is concerned. I discussed this with the patient in great detail today for yet again although I previously had this discussion with him in the past. 07/11/19-Patient returns at 1 week for his left great toe wound which is actually doing really well, he has been using the insert to offload the toe and is very pleased with it. 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 M) Treating RN: Montey Hora Primary Care Provider: Glendon Axe Other Clinician: Referring Provider: Glendon Axe Treating Provider/Extender: Beverly Gust in Treatment: 30 Constitutional alert and oriented x 3. sitting or  standing blood pressure is within target range for patient.. supine blood pressure is within target range for patient.. pulse regular and within target range for patient.Marland Kitchen respirations regular, non-labored and within target range for patient.Marland Kitchen temperature within target range for patient.. . . Well-nourished and well-hydrated in no acute distress. Notes Left great toe wound plantar surface looks good, small surface area of wound left with healthy base surrounding skin is intact with very little callus rim Electronic Signature(s) Signed: 07/11/2019 3:57:59 PM By: Tobi Bastos Entered By: Tobi Bastos on 07/11/2019 15:57:58 Bechler, [name] ([id number]) -------------------------------------------------------------------------------- Physician Orders Details Patient Name: [name]. Date of Service: 07/11/2019 3:45 PM Medical Record Number: [id number] Patient Account Number: [id number] Date of Birth/Sex: [birth year] (73 y.o. M) Treating RN: Montey Hora Primary Care Provider: Glendon Axe Other Clinician: Referring Provider: Glendon Axe Treating Provider/Extender: Beverly Gust in Treatment: 30 Verbal / Phone Orders: No Diagnosis Coding Wound Cleansing Wound #1R Left Toe Great o Clean wound with Normal Saline. o May shower with protection. - Do not get the cast wet Primary Wound Dressing Wound #1R Left Toe Great o Silver Collagen Secondary Dressing Wound #1R Left Toe Great o Conform/Kerlix o Foam Dressing Change Frequency Wound #1R Left Toe Great o Change dressing twice daily. Follow-up Appointments Wound #1R Left Toe Great o Return Appointment in 1 week. Off-Loading Wound #1R Left Toe Great o Other: - Continue wearing your shoes with your custom inserts Electronic Signature(s) Signed: 07/11/2019 4:11:29 PM By: Tobi Bastos Signed: 07/11/2019 4:43:58 PM By: Montey Hora Entered By: Montey Hora on 07/11/2019  15:56:47 [place], [name] ([id number]) -------------------------------------------------------------------------------- Progress Note Details Patient Name: [name]. Date of Service: 07/11/2019 3:45 PM Medical Record Number: [id number] Patient Account Number: [id number] Date of Birth/Sex: October 18, 1946 (73 y.o. M) Treating RN: Montey Hora Primary Care Provider: Glendon Axe Other Clinician: Referring Provider: Glendon Axe Treating Provider/Extender: Beverly Gust in Treatment: 30 Subjective History of Present Illness (HPI) 12/12/18 on evaluation today patient presents as a referral from his endocrinologist regarding issues that he has been having with his left great toe. Subsequently he has been seeing Dr. Cleda Mccreedy and Dr. Cleda Mccreedy has been the breeding the wound and in fact this was debrided this morning. The patient had an appointment there prior to coming here. Subsequently he states that even though he's been going  for regular debridement after office things really do not seem to be showing signs of improvement unfortunately. He states that he is having some discomfort but nothing too significant. No fevers, chills, nausea, or vomiting noted at this time. The patient does have a history of hypertension, neuropathy, diabetes mellitus type II, and a history of stroke. Currently he's been using bacitracin on the toe and utilizing a Band-Aid. He tells me he's had this wound for two years. He cannot remember the last time he had an x-ray. 12/19/18 on evaluation today patient appears to be doing well in regard to his plantar toe ulcer. He's been tolerating the dressing changes without complication. Fortunately there does not appear to be signs of infection at this time which is good news. No fever chills noted. 01/19/19 has been one month since I last saw the patient as he was out of town. With that being said on evaluation today it does appear that he is going to  require sharp debridement and I do believe that the total contact cast would benefit him as far as being applied at this time. He is in agreement that plan. 01/23/19 on evaluation today patient appears to be doing better in regard to his left great toe ulcer. He has been shown signs of improvement week by week and although this is slow it does seem to be doing fairly well which is good news. Overall very pleased in this regard. 01/31/19 on evaluation today patient presents for follow-up concerning his great toe ulcer. He was actually supposed to be here yesterday and he did come however he ended up having to leave due to his blood sugar dropping he states he just did not eat enough lunch. Fortunately seems to be doing much better today which is excellent news. Fortunately there's no evidence of infection at this time which is also good news. No fevers, chills, nausea, or vomiting noted at this time. Overall I feel like he is making excellent progress. 02/06/19 on evaluation today patient actually appears to be doing very well in regard to his plantar toe ulcer. He's been tolerating the dressing changes without complication. Fortunately there is no sign of infection at this time. Overall been very pleased with how things seem to be progressing. No fevers, chills, nausea, or vomiting noted at this time. 02/13/19 on evaluation today patient actually appears to be doing excellent in regard to his toe ulcer which is almost completely close. Overall very pleased with that. There does not appear to be any signs of infection which is good news. Upon close inspection it appears that he has just a very slight opening still noted at the central portion of the toe although this is minimal. 02/16/19 on evaluation today patient is seen for early follow-up due to the fact that he tells me while at home he actually got up to go answer the door without putting on the walking boot part of his cast and subsequently damaged  the total contact cast. It therefore comes in to have this removed and potentially replaced. Fortunately other than it given him a little bit of pain its far as pushing in on some areas there doesn't appear to be any injury once the cast was removed. 03/13/19 on evaluation today patient unfortunately presents for follow-up concerning his great toe ulcer on the left. He was discharged on 02/16/19 with the wound healed at that point. He tells me this remain so for several weeks or least a couple weeks before reopening.  Fortunately there does not appear to be any signs of infection at this time. Nonetheless for the past week at least he's had the wound reopened and states it is been trying to keep pressure off of this but he has not been using the offloading shoe we previously gave him. Fortunately there's no evidence of infection at this time. No fevers, chills, nausea, [name], Fred L. ([id number]) or vomiting noted at this time. 03/17/19 on evaluation today patient actually appears to be doing rather well in regard to his toe also. Fact this appears to be significantly smaller this week even compared to what I saw last week on evaluation. He seems to have done very well for the offloading shoe and overall I'm very pleased with the progress that is made. It may be that he doesn't even need the cast to get this to heal if he offloads this appropriately. 03/24/19 on evaluation today patient actually appears to be doing well in regard to his plantar foot ulcer. He's been tolerating the dressing changes without complication the collagen does seem to be beneficial for him. With that being said he is gonna require some miles sharp debridement today to clear away some of the callous's around the edge of the wound as well as the minimal Slough noted on the surface of the wound. 03/31/19 on evaluation today patient appears to be doing well in regard to his plantar great toe ulcer. He has been tolerating  the dressing changes without complication which is good news. Fortunately there does not appear to be any signs of active infection at this time. Overall very pleased with how things seem to be progressing. 04/11/19 on evaluation today patient's tools are appears to be doing in my pinion roughly about the same as were things that previous. Fortunately there does not appear to be signs of active infection at this time which is good news. With that being said he has not been experiencing any pain at this time which is good news there is also No fevers, chills, nausea, or vomiting noted at this time. 04/18/19 on evaluation today patient's wound actually appears to be doing fairly well today he did not even really have a lot of callous buildup. We have been debating on whether or not to reinitiate total contact cast. With that being said he seems to be doing fairly well this point with offloading in my pinion and again I think we need to come up with a way to get this healed that will also be sustainable for keeping it close. Again we were able to close it with the cast previous but again he has to be able to keep it such. For that reason we are working on trying to get him diabetic shoes he's waiting for the [place] to get in touch with the local company that has to measure him for these do not want to have a cast on at such a time as when they need to actually measure him. 04/25/19 on evaluation today patient appears to be doing well in regard to his plantar foot specifically great toe ulcer. He's been tolerating the dressing changes without complication. Fortunately there's no signs of active infection at this time. Overall I'm very pleased with how things appear currently. 05/02/19 on evaluation today patient appears to actually be doing better in regard to his plantar foot ulcer. He shown signs of good improvement which is excellent news. She states he can walk better with the cast on the can with  the  offloading shoe that he had on previous. Fortunately there's no signs of active infection at this time. No fevers, chills, nausea, or vomiting noted at this time. 05/09/19 on evaluation today patient actually appears to be doing excellent. In fact he appears to be completely healed based on evaluation at this time. Fortunately there's no signs of infection and is having no discomfort. 06/08/19 this is a follow-up visit although the patient has been healed for almost a month but not quite. He tells me that in the past several days he noted some blood coming from his toe and he thought he should come in at this checked out. Fortunately he's having a pain. He also did get his custom orthotics shoes. With that being said he still appears to have developed this ulceration there is something he tells me that the orthotic specialist stated they could do to the toes to try to help out in this regard although he did not know exactly what it was we may need to [facility]. 06/12/19 on evaluation today patient actually appears to be doing great in regard to his foot ulcer. The wound on his toes shown signs of excellent improvement which is great news. With that being said he is not completely close yet the cast has done wonders for him and he is definitely headed in the correct direction. No fevers, chills, nausea, or vomiting noted at this time. 06/22/19 on evaluation today fortunately patient actually appears to be healing quite well and in fact appears to be completely healed this is excellent news. Fortunately there's no signs of active infection he's been tolerating the total contact cast without complication. Overall I'm pleased with the progress that has been made. He also has his new custom shoes he brought was with him today. 07/04/19 on evaluation today patient actually appears to be doing a little bit worse in regard his toe ulcer unfortunately this has yet again reopened. It has only been  about a week and a half since I've last seen him I really did not expect this to reopen this quickly. Nonetheless I did discuss with him his normal routine in order to see what may be causing this. He has custom [name], Fred L. ([id number]) shoes. With that being said he apparently has not been using the custom shoes and even when he has used them he has not even put the insert in there which is what was custom molded to him in order to appropriately offload high-pressure point areas. In fact his exact question to me was whether or not he needed to actually put those in they just came loose in the box and he had never installed them. Nonetheless it also came to light the he's been walking around in his bedroom slippers at home he tells me he does not go barefoot but he really has not been wearing his custom offloading/diabetic shoes. Nonetheless I do believe that this is why he continually reopens as far as the wound is concerned. I discussed this with the patient in great detail today for yet again although I previously had this discussion with him in the past. 07/11/19-Patient returns at 1 week for his left great toe wound which is actually doing really well, he has been using the insert to offload the toe and is very pleased with it. This looks like this is working out really well for him Objective Constitutional alert and oriented x 3. sitting or standing blood pressure is within target  range for patient.. supine blood pressure is within target range for patient.. pulse regular and within target range for patient.Marland Kitchen respirations regular, non-labored and within target range for patient.Marland Kitchen temperature within target range for patient.. Well-nourished and well-hydrated in no acute distress. Vitals Time Taken: 3:49 PM, Height: 72 in, Weight: 250 lbs, BMI: 33.9, Temperature: 98.5 F, Pulse: 85 bpm, Respiratory Rate: 16 breaths/min, Blood Pressure: 160/80 mmHg. General Notes: Left great toe  wound plantar surface looks good, small surface area of wound left with healthy base surrounding skin is intact with very little callus rim Integumentary (Hair, Skin) Wound #1R status is Open. Original cause of wound was Gradually Appeared. The wound is located on the Left Toe Great. The wound measures 0.3cm length x 0.4cm width x 0.1cm depth; 0.094cm^2 area and 0.009cm^3 volume. There is Fat Layer (Subcutaneous Tissue) Exposed exposed. There is no tunneling or undermining noted. There is a none present amount of drainage noted. The wound margin is thickened. There is large (67-100%) granulation within the wound bed. There is a small (1-33%) amount of necrotic tissue within the wound bed including Adherent Slough. Plan Wound Cleansing: Wound #1R Left Toe Great: Clean wound with Normal Saline. May shower with protection. - Do not get the cast wet Primary Wound Dressing: Wound #1R Left Toe Great: Silver Collagen Secondary Dressing: Wound #1R Left Toe Great: Conform/Kerlix Foam Dressing Change Frequency: Fred, [name] ([id number]) Wound #1R Left Toe Great: Change dressing twice daily. Follow-up Appointments: Wound #1R Left Toe Great: Return Appointment in 1 week. Off-Loading: Wound #1R Left Toe Great: Other: - Continue wearing your shoes with your custom inserts 1. Continue using Prisma to the wound 2. Return to clinic next week 3. This wound looks like is ready to heal and the offloading strategy is working Therapist, art) Signed: 07/11/2019 3:58:34 PM By: Tobi Bastos Entered By: Tobi Bastos on 07/11/2019 15:58:34 Goldberger, [name] ([id number]) -------------------------------------------------------------------------------- [place] Details Patient Name: [name]. Date of Service: 07/11/2019 Medical Record Number: [id number] Patient Account Number: [id number] Date of Birth/Sex: 1946/07/14 (73 y.o. 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

## 2019-07-18 ENCOUNTER — Other Ambulatory Visit: Payer: Self-pay

## 2019-07-18 ENCOUNTER — Encounter: Payer: Medicare Other | Admitting: Physician Assistant

## 2019-07-18 DIAGNOSIS — E11621 Type 2 diabetes mellitus with foot ulcer: Secondary | ICD-10-CM | POA: Diagnosis not present

## 2019-07-19 NOTE — Progress Notes (Signed)
[name], [name] ([id number]) Visit Report for 07/18/2019 Arrival Information Details Patient Name: [name], [name]. Date of Service: 07/18/2019 10:30 AM Medical Record Number: [id number] Patient Account Number: [id number] Date of Birth/Sex: November 22, 1946 (73 y.o. M) Treating RN: Montey Hora Primary Care Ceaser Ebeling: Glendon Axe Other Clinician: Referring Roxas Clymer: Glendon Axe Treating [place] Pizzimenti/Extender: STONE III, HOYT Weeks in Treatment: 1 Visit Information History Since Last Visit Added or deleted any medications: No Patient Arrived: Ambulatory Any new allergies or adverse reactions: No Arrival Time: 10:16 Had a fall or experienced change in No Accompanied By: self activities of daily living that may affect Transfer Assistance: None risk of falls: Patient Identification Verified: Yes Signs or symptoms of abuse/neglect since last visito No Secondary Verification Process Completed: Yes Hospitalized since last visit: No Patient Has Alerts: Yes Implantable device outside of the clinic excluding No Patient Alerts: DMII cellular tissue based products placed in the center since last visit: Has Dressing in Place as Prescribed: Yes Pain Present Now: No Electronic Signature(s) Signed: 07/18/2019 11:14:01 AM By: Lorine Bears RCP, RRT, CHT Entered By: Lorine Bears on 07/18/2019 10:17:04 [name] ([id number]) -------------------------------------------------------------------------------- Encounter Discharge Information Details Patient Name: [name]. Date of Service: 07/18/2019 10:30 AM Medical Record Number: [id number] Patient Account Number: [id number] Date of Birth/Sex: Aug 22, 1946 (73 y.o. M) Treating RN: Montey Hora Primary Care Caelum Federici: Glendon Axe Other Clinician: Referring Zakariah Dejarnette: Glendon Axe Treating Lira Stephen/Extender: STONE III, HOYT Weeks in Treatment: 31 Encounter Discharge Information  Items Post Procedure Vitals Discharge Condition: Stable Temperature (F): 98.0 Ambulatory Status: Cane Pulse (bpm): 75 Discharge Destination: Home Respiratory Rate (breaths/min): 16 Transportation: Private Auto Blood Pressure (mmHg): 158/83 Accompanied By: self Schedule Follow-up Appointment: Yes Clinical Summary of Care: Electronic Signature(s) Signed: 07/18/2019 4:59:24 PM By: Montey Hora Entered By: Montey Hora on 07/18/2019 10:53:28 [name], [name] ([id number]) -------------------------------------------------------------------------------- Lower Extremity Assessment Details Patient Name: [name]. Date of Service: 07/18/2019 10:30 AM Medical Record Number: [id number] Patient Account Number: [id number] Date of Birth/Sex: 01/10/1946 (73 y.o. M) Treating RN: Army Melia Primary Care Roshanna Cimino: Glendon Axe Other Clinician: Referring Eleisha Branscomb: Glendon Axe Treating Janari Gagner/Extender: STONE III, HOYT Weeks in Treatment: 31 Edema Assessment Assessed: [Left: No] [Right: No] Edema: [Left: N] [Right: o] Vascular Assessment Pulses: Dorsalis Pedis Palpable: [Left:Yes] Electronic Signature(s) Signed: 07/18/2019 10:23:44 AM By: Army Melia Entered By: Army Melia on 07/18/2019 10:22:31 [place], [name] ([id number]) -------------------------------------------------------------------------------- Multi Wound Chart Details Patient Name: [name]. Date of Service: 07/18/2019 10:30 AM Medical Record Number: [id number] Patient Account Number: [id number] Date of Birth/Sex: [birth year] (73 y.o. M) Treating RN: Montey Hora Primary Care Sarahy Creedon: Glendon Axe Other Clinician: Referring Ziyanna Tolin: Glendon Axe Treating Coryn Mosso/Extender: STONE III, HOYT Weeks in Treatment: 31 Vital Signs Height(in): 72 Pulse(bpm): 75 Weight(lbs): 250 Blood Pressure(mmHg): 158/83 Body Mass Index(BMI): 34 Temperature(F): 98.0 Respiratory  Rate 16 (breaths/min): Photos: [N/A:N/A] Wound Location: Left Toe Great N/A N/A Wounding Event: Gradually Appeared N/A N/A Primary Etiology: Diabetic Wound/Ulcer of the N/A N/A Lower Extremity Comorbid History: Hypertension, Type II Diabetes N/A N/A Date Acquired: 11/30/2017 N/A N/A Weeks of Treatment: 31 N/A N/A Wound Status: Open N/A N/A Wound Recurrence: Yes N/A N/A Measurements L x W x D 0.1x0.1x0.1 N/A N/A (cm) Area (cm) : 0.008 N/A N/A Volume (cm) : 0.001 N/A N/A % Reduction in Area: 98.70% N/A N/A % Reduction in Volume: 98.40% N/A N/A Classification: Grade 2 N/A N/A Exudate Amount: None Present N/A N/A Wound Margin: Thickened N/A N/A Granulation Amount: Small (1-33%) N/A N/A Necrotic Amount:  Large (67-100%) N/A N/A Necrotic Tissue: Eschar, Adherent Slough N/A N/A Exposed Structures: Fat Layer (Subcutaneous N/A N/A Tissue) Exposed: Yes Fascia: No Tendon: No Muscle: No Joint: No Bone: No Epithelialization: Small (1-33%) N/A N/A [name], [name] ([id number]) Treatment Notes Electronic Signature(s) Signed: 07/18/2019 4:59:24 PM By: Montey Hora Entered By: Montey Hora on 07/18/2019 10:44:36 [place], [name] ([id number]) -------------------------------------------------------------------------------- [place] Details Patient Name: [name]. Date of Service: 07/18/2019 10:30 AM Medical Record Number: [id number] Patient Account Number: [id number] Date of Birth/Sex: 14-Jul-1946 (73 y.o. M) Treating RN: Montey Hora Primary Care Diasia Henken: Glendon Axe Other Clinician: Referring Taegen Lennox: Glendon Axe Treating Nassir Neidert/Extender: STONE III, HOYT Weeks in Treatment: 31 Active Inactive Abuse / Safety / Falls / Self Care Management Nursing Diagnoses: Potential for falls Goals: Patient will remain injury free related to falls Date Initiated: 01/19/2019 Target Resolution Date: 07/29/2019 Goal Status:  Active Interventions: Assess fall risk on admission and as needed Notes: Orientation to the Wound Care Program Nursing Diagnoses: Knowledge deficit related to the wound healing center program Goals: Patient/caregiver will verbalize understanding of the [place] Program Date Initiated: 01/19/2019 Target Resolution Date: 08/11/2019 Goal Status: Active Interventions: Provide education on orientation to the wound center Notes: Peripheral Neuropathy Nursing Diagnoses: Knowledge deficit related to disease process and management of peripheral neurovascular dysfunction Goals: Patient/caregiver will verbalize understanding of disease process and disease management Date Initiated: 03/17/2019 Target Resolution Date: 08/26/2019 Goal Status: Active Interventions: Assess signs and symptoms of neuropathy upon admission and as needed [name], [name] ([id number]) Provide education on Management of Neuropathy and Related Ulcers Notes: Wound/Skin Impairment Nursing Diagnoses: Impaired tissue integrity Goals: Ulcer/skin breakdown will have a volume reduction of 30% by week 4 Date Initiated: 12/12/2018 Target Resolution Date: 08/26/2019 Goal Status: Active Interventions: Assess patient/caregiver ability to obtain necessary supplies Assess patient/caregiver ability to perform ulcer/skin care regimen upon admission and as needed Notes: Electronic Signature(s) Signed: 07/18/2019 4:59:24 PM By: Montey Hora Entered By: Montey Hora on 07/18/2019 10:44:13 Schuhmacher, [name] ([id number]) -------------------------------------------------------------------------------- Pain Assessment Details Patient Name: [name]. Date of Service: 07/18/2019 10:30 AM Medical Record Number: [id number] Patient Account Number: [id number] Date of Birth/Sex: 21-Sep-1946 (73 y.o. M) Treating RN: Montey Hora Primary Care Aidin Doane: Glendon Axe Other Clinician: Referring Dasan Hardman:  Glendon Axe Treating Soha Thorup/Extender: STONE III, HOYT Weeks in Treatment: 31 Active Problems Location of Pain Severity and Description of Pain Patient Has Paino No Site Locations Pain Management and Medication Current Pain Management: Electronic Signature(s) Signed: 07/18/2019 11:14:01 AM By: Paulla Fore, RRT, CHT Signed: 07/18/2019 4:59:24 PM By: Montey Hora Entered By: Lorine Bears on 07/18/2019 10:17:13 [name] ([id number]) -------------------------------------------------------------------------------- Patient/Caregiver Education Details Patient Name: [name]. Date of Service: 07/18/2019 10:30 AM Medical Record Number: [id number] Patient Account Number: [id number] Date of Birth/Gender: [birth year] (73 y.o. M) Treating RN: Montey Hora Primary Care Physician: Glendon Axe Other Clinician: Referring Physician: Glendon Axe Treating Physician/Extender: Sharalyn Ink in Treatment: 31 Education Assessment Education Provided To: Patient Education Topics Provided Offloading: Handouts: Other: TCC Methods: Explain/Verbal Responses: State content correctly Electronic Signature(s) Signed: 07/18/2019 4:59:24 PM By: Montey Hora Entered By: Montey Hora on 07/18/2019 10:51:59 [place], [name] ([id number]) -------------------------------------------------------------------------------- Wound Assessment Details Patient Name: [name]. Date of Service: 07/18/2019 10:30 AM Medical Record Number: [id number] Patient Account Number: [id number] Date of Birth/Sex: August 10, 1946 (73 y.o. M) Treating RN: Army Melia Primary Care Weston Kallman: Glendon Axe Other Clinician: Referring Annise Boran: Glendon Axe Treating Ariana Cavenaugh/Extender: STONE III, HOYT Weeks  in Treatment: 31 Wound Status Wound Number: 1R Primary Etiology: Diabetic Wound/Ulcer of the Lower Extremity Wound Location: Left Toe  Great Wound Status: Open Wounding Event: Gradually Appeared Comorbid Hypertension, Type II Diabetes Date Acquired: 11/30/2017 History: Weeks Of Treatment: 31 Clustered Wound: No Photos Wound Measurements Length: (cm) 0.1 % Reduction i Width: (cm) 0.1 % Reduction i Depth: (cm) 0.1 Epithelializa Area: (cm) 0.008 Tunneling: Volume: (cm) 0.001 Undermining: n Area: 98.7% n Volume: 98.4% tion: Small (1-33%) No No Wound Description Classification: Grade 2 Foul Odor Aft Wound Margin: Thickened Slough/Fibrin Exudate Amount: None Present er Cleansing: No o Yes Wound Bed Granulation Amount: Small (1-33%) Exposed Structure Necrotic Amount: Large (67-100%) Fascia Exposed: No Necrotic Quality: Eschar, Adherent Slough Fat Layer (Subcutaneous Tissue) Exposed: Yes Tendon Exposed: No Muscle Exposed: No Joint Exposed: No Bone Exposed: No Treatment Notes Wound #1R (Left Toe Great) Notes [name], [name] ([id number]) prisma, foam, TCC Electronic Signature(s) Signed: 07/18/2019 10:23:44 AM By: Army Melia Entered By: Army Melia on 07/18/2019 10:22:15 [name] ([id number]) -------------------------------------------------------------------------------- Vitals Details Patient Name: [name]. Date of Service: 07/18/2019 10:30 AM Medical Record Number: [id number] Patient Account Number: [id number] Date of Birth/Sex: 22-Jan-1946 (73 y.o. M) Treating RN: Montey Hora Primary Care Taneal Sonntag: Glendon Axe Other Clinician: Referring Lindsie Simar: Glendon Axe Treating Kapil Petropoulos/Extender: STONE III, HOYT Weeks in Treatment: 31 Vital Signs Time Taken: 10:17 Temperature (F): 98.0 Height (in): 72 Pulse (bpm): 75 Weight (lbs): 250 Respiratory Rate (breaths/min): 16 Body Mass Index (BMI): 33.9 Blood Pressure (mmHg): 158/83 Reference Range: 80 - 120 mg / dl Electronic Signature(s) Signed: 07/18/2019 11:14:01 AM By: Lorine Bears RCP,  RRT, CHT Entered By: Lorine Bears on 07/18/2019 10:20:00

## 2019-07-21 NOTE — Progress Notes (Signed)
[name], [name] ([id number]) Visit Report for 07/18/2019 Chief Complaint Document Details Patient Name: [name], [name]. Date of Service: 07/18/2019 10:30 AM Medical Record Number: [id number] Patient Account Number: [id number] Date of Birth/Sex: [birth year] (73 y.o. M) Treating RN: Montey Hora Primary Care Provider: Glendon Axe Other Clinician: Referring Provider: Glendon Axe Treating Provider/Extender: STONE III, HOYT Weeks in Treatment: 31 Information Obtained from: Patient Chief Complaint Left great toe ulcer Electronic Signature(s) Signed: 07/18/2019 6:14:16 PM By: Worthy Keeler PA-C Entered By: Worthy Keeler on 07/18/2019 10:14:55 [name] ([id number]) -------------------------------------------------------------------------------- Debridement Details Patient Name: [name]. Date of Service: 07/18/2019 10:30 AM Medical Record Number: [id number] Patient Account Number: [id number] Date of Birth/Sex: [birth year] (73 y.o. M) Treating RN: Montey Hora Primary Care Provider: Glendon Axe Other Clinician: Referring Provider: Glendon Axe Treating Provider/Extender: STONE III, HOYT Weeks in Treatment: 31 Debridement Performed for Wound #1R Left Toe Great Assessment: Performed By: Physician STONE III, HOYT E., PA-C Debridement Type: Debridement Severity of Tissue Pre Fat layer exposed Debridement: Level of Consciousness (Pre- Awake and Alert procedure): Pre-procedure Verification/Time Yes - 10:44 Out Taken: Start Time: 10:44 Pain Control: Lidocaine 4% Topical Solution Total Area Debrided (L x W): 1 (cm) x 1 (cm) = 1 (cm) Tissue and other material Viable, Non-Viable, Callus, Slough, Subcutaneous, Slough debrided: Level: Skin/Subcutaneous Tissue Debridement Description: Excisional Instrument: Curette Bleeding: Minimum Hemostasis Achieved: Pressure End Time: 10:48 Procedural Pain: 0 Post Procedural Pain:  0 Response to Treatment: Procedure was tolerated well Level of Consciousness Awake and Alert (Post-procedure): Post Debridement Measurements of Total Wound Length: (cm) 0.2 Width: (cm) 0.3 Depth: (cm) 0.2 Volume: (cm) 0.009 Character of Wound/Ulcer Post Debridement: Improved Severity of Tissue Post Debridement: Fat layer exposed Post Procedure Diagnosis Same as Pre-procedure Electronic Signature(s) Signed: 07/18/2019 4:59:24 PM By: Montey Hora Signed: 07/18/2019 6:14:16 PM By: Worthy Keeler PA-C Entered By: Montey Hora on 07/18/2019 10:50:24 [name] ([id number]) -------------------------------------------------------------------------------- HPI Details Patient Name: [name] Date of Service: 07/18/2019 10:30 AM Medical Record Number: [id number] Patient Account Number: [id number] Date of Birth/Sex: 04/22/1946 (73 y.o. M) Treating RN: Montey Hora Primary Care Provider: Glendon Axe Other Clinician: Referring Provider: Glendon Axe Treating Provider/Extender: Melburn Hake, HOYT Weeks in Treatment: 31 History of Present Illness HPI Description: 12/12/18 on evaluation today patient presents as a referral from his endocrinologist regarding issues that he has been having with his left great toe. Subsequently he has been seeing Dr. Cleda Mccreedy and Dr. Cleda Mccreedy has been the breeding the wound and in fact this was debrided this morning. The patient had an appointment there prior to coming here. Subsequently he states that even though he's been going for regular debridement after office things really do not seem to be showing signs of improvement unfortunately. He states that he is having some discomfort but nothing too significant. No fevers, chills, nausea, or vomiting noted at this time. The patient does have a history of hypertension, neuropathy, diabetes mellitus type II, and a history of stroke. Currently he's been using bacitracin on the toe and  utilizing a Band-Aid. He tells me he's had this wound for two years. He cannot remember the last time he had an x-ray. 12/19/18 on evaluation today patient appears to be doing well in regard to his plantar toe ulcer. He's been tolerating the dressing changes without complication. Fortunately there does not appear to be signs of infection at this time which is good news. No fever chills noted. 01/19/19 has been one month since I last  saw the patient as he was out of town. With that being said on evaluation today it does appear that he is going to require sharp debridement and I do believe that the total contact cast would benefit him as far as being applied at this time. He is in agreement that plan. 01/23/19 on evaluation today patient appears to be doing better in regard to his left great toe ulcer. He has been shown signs of improvement week by week and although this is slow it does seem to be doing fairly well which is good news. Overall very pleased in this regard. 01/31/19 on evaluation today patient presents for follow-up concerning his great toe ulcer. He was actually supposed to be here yesterday and he did come however he ended up having to leave due to his blood sugar dropping he states he just did not eat enough lunch. Fortunately seems to be doing much better today which is excellent news. Fortunately there's no evidence of infection at this time which is also good news. No fevers, chills, nausea, or vomiting noted at this time. Overall I feel like he is making excellent progress. 02/06/19 on evaluation today patient actually appears to be doing very well in regard to his plantar toe ulcer. He's been tolerating the dressing changes without complication. Fortunately there is no sign of infection at this time. Overall been very pleased with how things seem to be progressing. No fevers, chills, nausea, or vomiting noted at this time. 02/13/19 on evaluation today patient actually appears to be  doing excellent in regard to his toe ulcer which is almost completely close. Overall very pleased with that. There does not appear to be any signs of infection which is good news. Upon close inspection it appears that he has just a very slight opening still noted at the central portion of the toe although this is minimal. 02/16/19 on evaluation today patient is seen for early follow-up due to the fact that he tells me while at home he actually got up to go answer the door without putting on the walking boot part of his cast and subsequently damaged the total contact cast. It therefore comes in to have this removed and potentially replaced. Fortunately other than it given him a little bit of pain its far as pushing in on some areas there doesn't appear to be any injury once the cast was removed. 03/13/19 on evaluation today patient unfortunately presents for follow-up concerning his great toe ulcer on the left. He was discharged on 02/16/19 with the wound healed at that point. He tells me this remain so for several weeks or least a couple weeks before reopening. Fortunately there does not appear to be any signs of infection at this time. Nonetheless for the past week at least he's had the wound reopened and states it is been trying to keep pressure off of this but he has not been using the offloading shoe we previously gave him. Fortunately there's no evidence of infection at this time. No fevers, chills, nausea, or vomiting noted at this time. [name], [name] ([id number]) 03/17/19 on evaluation today patient actually appears to be doing rather well in regard to his toe also. Fact this appears to be significantly smaller this week even compared to what I saw last week on evaluation. He seems to have done very well for the offloading shoe and overall I'm very pleased with the progress that is made. It may be that he doesn't even need the cast to  get this to heal if he offloads this  appropriately. 03/24/19 on evaluation today patient actually appears to be doing well in regard to his plantar foot ulcer. He's been tolerating the dressing changes without complication the collagen does seem to be beneficial for him. With that being said he is gonna require some miles sharp debridement today to clear away some of the callous's around the edge of the wound as well as the minimal Slough noted on the surface of the wound. 03/31/19 on evaluation today patient appears to be doing well in regard to his plantar great toe ulcer. He has been tolerating the dressing changes without complication which is good news. Fortunately there does not appear to be any signs of active infection at this time. Overall very pleased with how things seem to be progressing. 04/11/19 on evaluation today patient's tools are appears to be doing in my pinion roughly about the same as were things that previous. Fortunately there does not appear to be signs of active infection at this time which is good news. With that being said he has not been experiencing any pain at this time which is good news there is also No fevers, chills, nausea, or vomiting noted at this time. 04/18/19 on evaluation today patient's wound actually appears to be doing fairly well today he did not even really have a lot of callous buildup. We have been debating on whether or not to reinitiate total contact cast. With that being said he seems to be doing fairly well this point with offloading in my pinion and again I think we need to come up with a way to get this healed that will also be sustainable for keeping it close. Again we were able to close it with the cast previous but again he has to be able to keep it such. For that reason we are working on trying to get him diabetic shoes he's waiting for the [facility] to get in touch with the local company that has to measure him for these do not want to have a cast on at such a time as when they need  to actually measure him. 04/25/19 on evaluation today patient appears to be doing well in regard to his plantar foot specifically great toe ulcer. He's been tolerating the dressing changes without complication. Fortunately there's no signs of active infection at this time. Overall I'm very pleased with how things appear currently. 05/02/19 on evaluation today patient appears to actually be doing better in regard to his plantar foot ulcer. He shown signs of good improvement which is excellent news. She states he can walk better with the cast on the can with the offloading shoe that he had on previous. Fortunately there's no signs of active infection at this time. No fevers, chills, nausea, or vomiting noted at this time. 05/09/19 on evaluation today patient actually appears to be doing excellent. In fact he appears to be completely healed based on evaluation at this time. Fortunately there's no signs of infection and is having no discomfort. 06/08/19 this is a follow-up visit although the patient has been healed for almost a month but not quite. He tells me that in the past several days he noted some blood coming from his toe and he thought he should come in at this checked out. Fortunately he's having a pain. He also did get his custom orthotics shoes. With that being said he still appears to have developed this ulceration there is something he tells me that  the orthotic specialist stated they could do to the toes to try to help out in this regard although he did not know exactly what it was we may need to [facility] with [facility]. 06/12/19 on evaluation today patient actually appears to be doing great in regard to his foot ulcer. The wound on his toes shown signs of excellent improvement which is great news. With that being said he is not completely close yet the cast has done wonders for him and he is definitely headed in the correct direction. No fevers, chills, nausea, or vomiting noted at  this time. 06/22/19 on evaluation today fortunately patient actually appears to be healing quite well and in fact appears to be completely healed this is excellent news. Fortunately there's no signs of active infection he's been tolerating the total contact cast without complication. Overall I'm pleased with the progress that has been made. He also has his new custom shoes he brought was with him today. 07/04/19 on evaluation today patient actually appears to be doing a little bit worse in regard his toe ulcer unfortunately this has yet again reopened. It has only been about a week and a half since I've last seen him I really did not expect this to reopen this quickly. Nonetheless I did discuss with him his normal routine in order to see what may be causing this. He has custom shoes. With that being said he apparently has not been using the custom shoes and even when he has used them he has not [name], [name]. ([id number]) even put the insert in there which is what was custom molded to him in order to appropriately offload high-pressure point areas. In fact his exact question to me was whether or not he needed to actually put those in they just came loose in the box and he had never installed them. Nonetheless it also came to light the he's been walking around in his bedroom slippers at home he tells me he does not go barefoot but he really has not been wearing his custom offloading/diabetic shoes. Nonetheless I do believe that this is why he continually reopens as far as the wound is concerned. I discussed this with the patient in great detail today for yet again although I previously had this discussion with him in the past. 07/11/19-Patient returns at 1 week for his left great toe wound which is actually doing really well, he has been using the insert to offload the toe and is very pleased with it. This looks like this is working out really well for him 07/18/19 on evaluation today patient  appears to be doing really about the same with regard to his ulcer on the right great toe. He's been tolerating the dressing changes without complication. Fortunately there's no signs of active infection at this time. With that being said I think that we're having a difficult time getting this to heal and I do believe that initiating treatment with the total contact cast to get this to close would be a good idea. The good news is he seems to be keeping this from worsening so hopefully that means that if we get it closed he can prevent this from reopening in the future. Electronic Signature(s) Signed: 07/18/2019 6:14:16 PM By: Worthy Keeler PA-C Entered By: Worthy Keeler on 07/18/2019 17:58:09 Watkinson, [name] ([id number]) -------------------------------------------------------------------------------- Physical Exam Details Patient Name: [name] Date of Service: 07/18/2019 10:30 AM Medical Record Number: [id number] Patient Account Number: [id number] Date of  Birth/Sex: [birth year] (73 y.o. M) Treating RN: Montey Hora Primary Care Provider: Glendon Axe Other Clinician: Referring Provider: Glendon Axe Treating Provider/Extender: STONE III, HOYT Weeks in Treatment: 15 Constitutional Well-nourished and well-hydrated in no acute distress. Notes At this point I did go ahead and clean up the wound bed which he tolerated without complication we then proceeded to go ahead and reapply the total contact cast which is had in the past and he tolerated the application without complication. Hopefully we'll get this healed and then get things under better control for him as far as what he can do to keep it from reopening again in the future. Electronic Signature(s) Signed: 07/18/2019 6:14:16 PM By: Worthy Keeler PA-C Entered By: Worthy Keeler on 07/18/2019 18:04:06 Hume, [name]  ([id number]) -------------------------------------------------------------------------------- Physician Orders Details Patient Name: [name] Date of Service: 07/18/2019 10:30 AM Medical Record Number: [id number] Patient Account Number: [id number] Date of Birth/Sex: March 18, 1946 (73 y.o. M) Treating RN: Montey Hora Primary Care Provider: Glendon Axe Other Clinician: Referring Provider: Glendon Axe Treating Provider/Extender: Melburn Hake, HOYT Weeks in Treatment: 31 Verbal / Phone Orders: No Diagnosis Coding ICD-10 Coding Code Description E11.621 Type 2 diabetes mellitus with foot ulcer L97.522 Non-pressure chronic ulcer of other part of left foot with fat layer exposed I10 Essential (primary) hypertension E11.43 Type 2 diabetes mellitus with diabetic autonomic (poly)neuropathy Wound Cleansing Wound #1R Left Toe Great o Clean wound with Normal Saline. o May shower with protection. - Do not get the cast wet Primary Wound Dressing Wound #1R Left Toe Great o Silver Collagen Secondary Dressing Wound #1R Left Toe Great o Foam Dressing Change Frequency Wound #1R Left Toe Great o Change dressing every week Follow-up Appointments Wound #1R Left Toe Great o Return Appointment in 1 week. Off-Loading Wound #1R Left Toe Great o Total Contact Cast to Left Lower Extremity Electronic Signature(s) Signed: 07/18/2019 4:59:24 PM By: Montey Hora Signed: 07/18/2019 6:14:16 PM By: Worthy Keeler PA-C Entered By: Montey Hora on 07/18/2019 10:51:35 Trombetta, [name] ([id number]) -------------------------------------------------------------------------------- Problem List Details Patient Name: LENVILLE, HIBBERD. Date of Service: 07/18/2019 10:30 AM Medical Record Number: [id number] Patient Account Number: [id number] Date of Birth/Sex: 1946-08-03 (73 y.o. M) Treating RN: Montey Hora Primary Care Provider: Glendon Axe Other Clinician: Referring  Provider: Glendon Axe Treating Provider/Extender: Melburn Hake, HOYT Weeks in Treatment: 31 Active Problems ICD-10 Evaluated Encounter Code Description Active Date Today Diagnosis E11.621 Type 2 diabetes mellitus with foot ulcer 12/12/2018 No Yes L97.522 Non-pressure chronic ulcer of other part of left foot with fat 12/12/2018 No Yes layer exposed I10 Essential (primary) hypertension 12/12/2018 No Yes E11.43 Type 2 diabetes mellitus with diabetic autonomic (poly) 12/12/2018 No Yes neuropathy Inactive Problems Resolved Problems Electronic Signature(s) Signed: 07/18/2019 6:14:16 PM By: Worthy Keeler PA-C Entered By: Worthy Keeler on 07/18/2019 10:14:49 Ashford, [name] ([id number]) -------------------------------------------------------------------------------- Progress Note Details Patient Name: [name] Date of Service: 07/18/2019 10:30 AM Medical Record Number: [id number] Patient Account Number: [id number] Date of Birth/Sex: Sep 20, 1946 (73 y.o. M) Treating RN: Montey Hora Primary Care Provider: Glendon Axe Other Clinician: Referring Provider: Glendon Axe Treating Provider/Extender: Melburn Hake, HOYT Weeks in Treatment: 31 Subjective Chief Complaint Information obtained from Patient Left great toe ulcer History of Present Illness (HPI) 12/12/18 on evaluation today patient presents as a referral from his endocrinologist regarding issues that he has been having with his left great toe. Subsequently he has been seeing Dr. Cleda Mccreedy and Dr. Cleda Mccreedy has been the breeding the wound  and in fact this was debrided this morning. The patient had an appointment there prior to coming here. Subsequently he states that even though he's been going for regular debridement after office things really do not seem to be showing signs of improvement unfortunately. He states that he is having some discomfort but nothing too significant. No fevers, chills, nausea, or  vomiting noted at this time. The patient does have a history of hypertension, neuropathy, diabetes mellitus type II, and a history of stroke. Currently he's been using bacitracin on the toe and utilizing a Band-Aid. He tells me he's had this wound for two years. He cannot remember the last time he had an x-ray. 12/19/18 on evaluation today patient appears to be doing well in regard to his plantar toe ulcer. He's been tolerating the dressing changes without complication. Fortunately there does not appear to be signs of infection at this time which is good news. No fever chills noted. 01/19/19 has been one month since I last saw the patient as he was out of town. With that being said on evaluation today it does appear that he is going to require sharp debridement and I do believe that the total contact cast would benefit him as far as being applied at this time. He is in agreement that plan. 01/23/19 on evaluation today patient appears to be doing better in regard to his left great toe ulcer. He has been shown signs of improvement week by week and although this is slow it does seem to be doing fairly well which is good news. Overall very pleased in this regard. 01/31/19 on evaluation today patient presents for follow-up concerning his great toe ulcer. He was actually supposed to be here yesterday and he did come however he ended up having to leave due to his blood sugar dropping he states he just did not eat enough lunch. Fortunately seems to be doing much better today which is excellent news. Fortunately there's no evidence of infection at this time which is also good news. No fevers, chills, nausea, or vomiting noted at this time. Overall I feel like he is making excellent progress. 02/06/19 on evaluation today patient actually appears to be doing very well in regard to his plantar toe ulcer. He's been tolerating the dressing changes without complication. Fortunately there is no sign of infection at  this time. Overall been very pleased with how things seem to be progressing. No fevers, chills, nausea, or vomiting noted at this time. 02/13/19 on evaluation today patient actually appears to be doing excellent in regard to his toe ulcer which is almost completely close. Overall very pleased with that. There does not appear to be any signs of infection which is good news. Upon close inspection it appears that he has just a very slight opening still noted at the central portion of the toe although this is minimal. 02/16/19 on evaluation today patient is seen for early follow-up due to the fact that he tells me while at home he actually got up to go answer the door without putting on the walking boot part of his cast and subsequently damaged the total contact cast. It therefore comes in to have this removed and potentially replaced. Fortunately other than it given him a little bit of pain its far as pushing in on some areas there doesn't appear to be any injury once the cast was removed. [name], [name] ([id number]) 03/13/19 on evaluation today patient unfortunately presents for follow-up concerning his great toe ulcer  on the left. He was discharged on 02/16/19 with the wound healed at that point. He tells me this remain so for several weeks or least a couple weeks before reopening. Fortunately there does not appear to be any signs of infection at this time. Nonetheless for the past week at least he's had the wound reopened and states it is been trying to keep pressure off of this but he has not been using the offloading shoe we previously gave him. Fortunately there's no evidence of infection at this time. No fevers, chills, nausea, or vomiting noted at this time. 03/17/19 on evaluation today patient actually appears to be doing rather well in regard to his toe also. Fact this appears to be significantly smaller this week even compared to what I saw last week on evaluation. He seems to have done  very well for the offloading shoe and overall I'm very pleased with the progress that is made. It may be that he doesn't even need the cast to get this to heal if he offloads this appropriately. 03/24/19 on evaluation today patient actually appears to be doing well in regard to his plantar foot ulcer. He's been tolerating the dressing changes without complication the collagen does seem to be beneficial for him. With that being said he is gonna require some miles sharp debridement today to clear away some of the callous's around the edge of the wound as well as the minimal Slough noted on the surface of the wound. 03/31/19 on evaluation today patient appears to be doing well in regard to his plantar great toe ulcer. He has been tolerating the dressing changes without complication which is good news. Fortunately there does not appear to be any signs of active infection at this time. Overall very pleased with how things seem to be progressing. 04/11/19 on evaluation today patient's tools are appears to be doing in my pinion roughly about the same as were things that previous. Fortunately there does not appear to be signs of active infection at this time which is good news. With that being said he has not been experiencing any pain at this time which is good news there is also No fevers, chills, nausea, or vomiting noted at this time. 04/18/19 on evaluation today patient's wound actually appears to be doing fairly well today he did not even really have a lot of callous buildup. We have been debating on whether or not to reinitiate total contact cast. With that being said he seems to be doing fairly well this point with offloading in my pinion and again I think we need to come up with a way to get this healed that will also be sustainable for keeping it close. Again we were able to close it with the cast previous but again he has to be able to keep it such. For that reason we are working on trying to get him  diabetic shoes he's waiting for the [place] to get in touch with the local company that has to measure him for these do not want to have a cast on at such a time as when they need to actually measure him. 04/25/19 on evaluation today patient appears to be doing well in regard to his plantar foot specifically great toe ulcer. He's been tolerating the dressing changes without complication. Fortunately there's no signs of active infection at this time. Overall I'm very pleased with how things appear currently. 05/02/19 on evaluation today patient appears to actually be doing better in regard  to his plantar foot ulcer. He shown signs of good improvement which is excellent news. She states he can walk better with the cast on the can with the offloading shoe that he had on previous. Fortunately there's no signs of active infection at this time. No fevers, chills, nausea, or vomiting noted at this time. 05/09/19 on evaluation today patient actually appears to be doing excellent. In fact he appears to be completely healed based on evaluation at this time. Fortunately there's no signs of infection and is having no discomfort. 06/08/19 this is a follow-up visit although the patient has been healed for almost a month but not quite. He tells me that in the past several days he noted some blood coming from his toe and he thought he should come in at this checked out. Fortunately he's having a pain. He also did get his custom orthotics shoes. With that being said he still appears to have developed this ulceration there is something he tells me that the orthotic specialist stated they could do to the toes to try to help out in this regard although he did not know exactly what it was we may need to check with [facility]. 06/12/19 on evaluation today patient actually appears to be doing great in regard to his foot ulcer. The wound on his toes shown signs of excellent improvement which is great news. With that being said he  is not completely close yet the cast has done wonders for him and he is definitely headed in the correct direction. No fevers, chills, nausea, or vomiting noted at this time. 06/22/19 on evaluation today fortunately patient actually appears to be healing quite well and in fact appears to be completely healed this is excellent news. Fortunately there's no signs of active infection he's been tolerating the total contact cast without complication. Overall I'm pleased with the progress that has been made. He also has his new custom shoes he brought was [name], [name] ([id number]) with him today. 07/04/19 on evaluation today patient actually appears to be doing a little bit worse in regard his toe ulcer unfortunately this has yet again reopened. It has only been about a week and a half since I've last seen him I really did not expect this to reopen this quickly. Nonetheless I did discuss with him his normal routine in order to see what may be causing this. He has custom shoes. With that being said he apparently has not been using the custom shoes and even when he has used them he has not even put the insert in there which is what was custom molded to him in order to appropriately offload high-pressure point areas. In fact his exact question to me was whether or not he needed to actually put those in they just came loose in the box and he had never installed them. Nonetheless it also came to light the he's been walking around in his bedroom slippers at home he tells me he does not go barefoot but he really has not been wearing his custom offloading/diabetic shoes. Nonetheless I do believe that this is why he continually reopens as far as the wound is concerned. I discussed this with the patient in great detail today for yet again although I previously had this discussion with him in the past. 07/11/19-Patient returns at 1 week for his left great toe wound which is actually doing really well, he has  been using the insert to offload the toe and is very pleased  with it. This looks like this is working out really well for him 07/18/19 on evaluation today patient appears to be doing really about the same with regard to his ulcer on the right great toe. He's been tolerating the dressing changes without complication. Fortunately there's no signs of active infection at this time. With that being said I think that we're having a difficult time getting this to heal and I do believe that initiating treatment with the total contact cast to get this to close would be a good idea. The good news is he seems to be keeping this from worsening so hopefully that means that if we get it closed he can prevent this from reopening in the future. Patient History Information obtained from Patient. Family History Diabetes - Mother, Heart Disease - Father, Stroke - Father, No family history of Cancer, Hereditary Spherocytosis, Hypertension, Kidney Disease, Lung Disease, Seizures, Thyroid Problems, Tuberculosis. Social History Never smoker, Marital Status - Widowed, Alcohol Use - Never, Drug Use - No History, Caffeine Use - Moderate. Medical History Eyes Denies history of Cataracts, Glaucoma, Optic Neuritis Ear/Nose/Mouth/Throat Denies history of Chronic sinus problems/congestion, Middle ear problems Hematologic/Lymphatic Denies history of Anemia, Hemophilia, Human Immunodeficiency Virus, Lymphedema Respiratory Denies history of Aspiration, Asthma, Chronic Obstructive Pulmonary Disease (COPD), Pneumothorax, Sleep Apnea, Tuberculosis Cardiovascular Patient has history of Hypertension Denies history of Angina, Arrhythmia, Congestive Heart Failure, Coronary Artery Disease, Deep Vein Thrombosis, Hypotension, Myocardial Infarction, Peripheral Arterial Disease, Peripheral Venous Disease, Phlebitis, Vasculitis Gastrointestinal Denies history of Cirrhosis , Colitis, Crohn s, Hepatitis A, Hepatitis B, Hepatitis  C Endocrine Patient has history of Type II Diabetes Denies history of Type I Diabetes Genitourinary Denies history of End Stage Renal Disease Immunological Denies history of Lupus Erythematosus, Raynaud s, Scleroderma Integumentary (Skin) Denies history of History of Burn, History of pressure wounds Musculoskeletal Denies history of Gout, Rheumatoid Arthritis, Osteoarthritis, Osteomyelitis Venuto, [name] ([id number]) Neurologic Denies history of Dementia, Neuropathy, Quadriplegia, Paraplegia, Seizure Disorder Oncologic Denies history of Received Chemotherapy, Received Radiation Review of Systems (ROS) Constitutional Symptoms (General Health) Denies complaints or symptoms of Fatigue, Fever, Chills, Marked Weight Change. Respiratory Denies complaints or symptoms of Chronic or frequent coughs, Shortness of Breath. Cardiovascular Denies complaints or symptoms of Chest pain, LE edema. Psychiatric Denies complaints or symptoms of Anxiety, Claustrophobia. Objective Constitutional Well-nourished and well-hydrated in no acute distress. Vitals Time Taken: 10:17 AM, Height: 72 in, Weight: 250 lbs, BMI: 33.9, Temperature: 98.0 F, Pulse: 75 bpm, Respiratory Rate: 16 breaths/min, Blood Pressure: 158/83 mmHg. General Notes: At this point I did go ahead and clean up the wound bed which he tolerated without complication we then proceeded to go ahead and reapply the total contact cast which is had in the past and he tolerated the application without complication. Hopefully we'll get this healed and then get things under better control for him as far as what he can do to keep it from reopening again in the future. Integumentary (Hair, Skin) Wound #1R status is Open. Original cause of wound was Gradually Appeared. The wound is located on the Left Toe Great. The wound measures 0.1cm length x 0.1cm width x 0.1cm depth; 0.008cm^2 area and 0.001cm^3 volume. There is Fat Layer (Subcutaneous  Tissue) Exposed exposed. There is no tunneling or undermining noted. There is a none present amount of drainage noted. The wound margin is thickened. There is small (1-33%) granulation within the wound bed. There is a large (67-100%) amount of necrotic tissue within the wound bed including Eschar and  Adherent Slough. Assessment Active Problems ICD-10 Type 2 diabetes mellitus with foot ulcer Non-pressure chronic ulcer of other part of left foot with fat layer exposed Essential (primary) hypertension Type 2 diabetes mellitus with diabetic autonomic (poly)neuropathy [name], [name] ([id number]) Procedures Wound #1R Pre-procedure diagnosis of Wound #1R is a Diabetic Wound/Ulcer of the Lower Extremity located on the Left Toe Great .Severity of Tissue Pre Debridement is: Fat layer exposed. There was a Excisional Skin/Subcutaneous Tissue Debridement with a total area of 1 sq cm performed by STONE III, HOYT E., PA-C. With the following instrument(s): Curette to remove Viable and Non-Viable tissue/material. Material removed includes Callus, Subcutaneous Tissue, and Slough after achieving pain control using Lidocaine 4% Topical Solution. No specimens were taken. A time out was conducted at 10:44, prior to the start of the procedure. A Minimum amount of bleeding was controlled with Pressure. The procedure was tolerated well with a pain level of 0 throughout and a pain level of 0 following the procedure. Post Debridement Measurements: 0.2cm length x 0.3cm width x 0.2cm depth; 0.009cm^3 volume. Character of Wound/Ulcer Post Debridement is improved. Severity of Tissue Post Debridement is: Fat layer exposed. Post procedure Diagnosis Wound #1R: Same as Pre-Procedure Pre-procedure diagnosis of Wound #1R is a Diabetic Wound/Ulcer of the Lower Extremity located on the Left Toe Great . There was a Total Contact Cast Procedure by STONE III, HOYT E., PA-C. Post procedure Diagnosis Wound #1R: Same as  Pre-Procedure Plan Wound Cleansing: Wound #1R Left Toe Great: Clean wound with Normal Saline. May shower with protection. - Do not get the cast wet Primary Wound Dressing: Wound #1R Left Toe Great: Silver Collagen Secondary Dressing: Wound #1R Left Toe Great: Foam Dressing Change Frequency: Wound #1R Left Toe Great: Change dressing every week Follow-up Appointments: Wound #1R Left Toe Great: Return Appointment in 1 week. Off-Loading: Wound #1R Left Toe Great: Total Contact Cast to Left Lower Extremity At this point my suggestion is gonna be that again that he keep the cast on for the time being until follow-up and we will see how things do. I did personally reapply the total contact cast today for the patient. We will subsequently reevaluate were things stand and then making adjustments as necessary in the next 1-2 weeks I expect this to likely close fairly quick. [name], [name] ([id number]) Please see above for specific wound care orders. We will see patient for re-evaluation in 1 week(s) here in the clinic. If anything worsens or changes patient will contact our office for additional recommendations. Electronic Signature(s) Signed: 07/18/2019 6:14:16 PM By: Worthy Keeler PA-C Entered By: Worthy Keeler on 07/18/2019 18:04:34 [place], [name] ([id number]) -------------------------------------------------------------------------------- ROS/PFSH Details Patient Name: [name] Date of Service: 07/18/2019 10:30 AM Medical Record Number: [id number] Patient Account Number: [id number] Date of Birth/Sex: 1946/10/19 (73 y.o. M) Treating RN: Montey Hora Primary Care Provider: Glendon Axe Other Clinician: Referring Provider: Glendon Axe Treating Provider/Extender: STONE III, HOYT Weeks in Treatment: 31 Information Obtained From Patient Constitutional Symptoms (General Health) Complaints and Symptoms: Negative for: Fatigue; Fever; Chills; Marked  Weight Change Respiratory Complaints and Symptoms: Negative for: Chronic or frequent coughs; Shortness of Breath Medical History: Negative for: Aspiration; Asthma; Chronic Obstructive Pulmonary Disease (COPD); Pneumothorax; Sleep Apnea; Tuberculosis Cardiovascular Complaints and Symptoms: Negative for: Chest pain; LE edema Medical History: Positive for: Hypertension Negative for: Angina; Arrhythmia; Congestive Heart Failure; Coronary Artery Disease; Deep Vein Thrombosis; Hypotension; Myocardial Infarction; Peripheral Arterial Disease; Peripheral Venous Disease; Phlebitis; Vasculitis Psychiatric Complaints and Symptoms:  Negative for: Anxiety; Claustrophobia Eyes Medical History: Negative for: Cataracts; Glaucoma; Optic Neuritis Ear/Nose/Mouth/Throat Medical History: Negative for: Chronic sinus problems/congestion; Middle ear problems Hematologic/Lymphatic Medical History: Negative for: Anemia; Hemophilia; Human Immunodeficiency Virus; Lymphedema Gastrointestinal OLEG, OLESON ([id number]) Medical History: Negative for: Cirrhosis ; Colitis; Crohnos; Hepatitis A; Hepatitis B; Hepatitis C Endocrine Medical History: Positive for: Type II Diabetes Negative for: Type I Diabetes Time with diabetes: 1990 Treated with: Insulin Blood sugar tested every day: Yes Tested : Genitourinary Medical History: Negative for: End Stage Renal Disease Immunological Medical History: Negative for: Lupus Erythematosus; Raynaudos; Scleroderma Integumentary (Skin) Medical History: Negative for: History of Burn; History of pressure wounds Musculoskeletal Medical History: Negative for: Gout; Rheumatoid Arthritis; Osteoarthritis; Osteomyelitis Neurologic Medical History: Negative for: Dementia; Neuropathy; Quadriplegia; Paraplegia; Seizure Disorder Oncologic Medical History: Negative for: Received Chemotherapy; Received Radiation Immunizations Pneumococcal Vaccine: Received  Pneumococcal Vaccination: Yes Implantable Devices No devices added Family and Social History Cancer: No; Diabetes: Yes - Mother; Heart Disease: Yes - Father; Hereditary Spherocytosis: No; Hypertension: No; Kidney Disease: No; Lung Disease: No; Seizures: No; Stroke: Yes - Father; Thyroid Problems: No; Tuberculosis: No; Never smoker; Marital Status - Widowed; Alcohol Use: Never; Drug Use: No History; Caffeine Use: Moderate; Financial Concerns: No; Food, Clothing or Shelter Needs: No; Support System Lacking: No; Transportation Concerns: No Physician Affirmation I have reviewed and agree with the above information. NEHAN, FLAUM ([id number]) Electronic Signature(s) Signed: 07/18/2019 6:14:16 PM By: Worthy Keeler PA-C Signed: 07/20/2019 4:10:49 PM By: Montey Hora Entered By: Worthy Keeler on 07/18/2019 17:58:32 Christian, [name] ([id number]) -------------------------------------------------------------------------------- Total Contact Cast Details Patient Name: TAFARI, HUMISTON Date of Service: 07/18/2019 10:30 AM Medical Record Number: [id number] Patient Account Number: [id number] Date of Birth/Sex: Sep 23, 1946 (73 y.o. M) Treating RN: Montey Hora Primary Care Provider: Glendon Axe Other Clinician: Referring Provider: Glendon Axe Treating Provider/Extender: STONE III, HOYT Weeks in Treatment: 31 Total Contact Cast Applied for Wound Assessment: Wound #1R Left Toe Great Performed By: Physician Emilio Math., PA-C Post Procedure Diagnosis Same as Pre-procedure Electronic Signature(s) Signed: 07/18/2019 4:59:24 PM By: Montey Hora Signed: 07/18/2019 6:14:16 PM By: Worthy Keeler PA-C Entered By: Montey Hora on 07/18/2019 10:50:52 [place] ([id number]) -------------------------------------------------------------------------------- SuperBill Details Patient Name: [name]. Date of Service: 07/18/2019 Medical Record  Number: [id number] Patient Account Number: [id number] Date of Birth/Sex: [birth year] (73 y.o. M) Treating RN: Montey Hora Primary Care Provider: Glendon Axe Other Clinician: Referring Provider: Glendon Axe Treating Provider/Extender: Melburn Hake, HOYT Weeks in Treatment: 31 Diagnosis Coding ICD-10 Codes Code Description E11.621 Type 2 diabetes mellitus with foot ulcer L97.522 Non-pressure chronic ulcer of other part of left foot with fat layer exposed I10 Essential (primary) hypertension E11.43 Type 2 diabetes mellitus with diabetic autonomic (poly)neuropathy Facility Procedures CPT4 Code: 59935701 Description: 77939 - DEB SUBQ TISSUE 20 SQ CM/< ICD-10 Diagnosis Description L97.522 Non-pressure chronic ulcer of other part of left foot with fat Modifier: layer exposed Quantity: 1 Physician Procedures CPT4 Code: 0300923 Description: 11042 - WC PHYS SUBQ TISS 20 SQ CM ICD-10 Diagnosis Description L97.522 Non-pressure chronic ulcer of other part of left foot with fat Modifier: layer exposed Quantity: 1 Electronic Signature(s) Signed: 07/18/2019 6:14:16 PM By: Worthy Keeler PA-C Entered By: Worthy Keeler on 07/18/2019 18:04:43

## 2019-07-25 ENCOUNTER — Other Ambulatory Visit: Payer: Self-pay

## 2019-07-25 ENCOUNTER — Encounter: Payer: Medicare Other | Admitting: Physician Assistant

## 2019-07-25 DIAGNOSIS — E11621 Type 2 diabetes mellitus with foot ulcer: Secondary | ICD-10-CM | POA: Diagnosis not present

## 2019-07-26 NOTE — Progress Notes (Addendum)
[name], [name] ([id number]) Visit Report for 07/25/2019 Arrival Information Details Patient Name: [name], [name]. Date of Service: 07/25/2019 10:45 AM Medical Record Number: [id number] Patient Account Number: [id number] Date of Birth/Sex: [birth year] (73 y.o. M) Treating RN: Army Melia Primary Care Aljean Horiuchi: Glendon Axe Other Clinician: Referring Gari Hartsell: Glendon Axe Treating Tova Vater/Extender: STONE III, HOYT Weeks in Treatment: 88 Visit Information History Since Last Visit Added or deleted any medications: No Patient Arrived: Ambulatory Any new allergies or adverse reactions: No Arrival Time: 10:47 Had a fall or experienced change in No Accompanied By: self activities of daily living that may affect Transfer Assistance: None risk of falls: Patient Has Alerts: Yes Signs or symptoms of abuse/neglect since last visito No Patient Alerts: DMII Hospitalized since last visit: No Has Dressing in Place as Prescribed: Yes Pain Present Now: No Electronic Signature(s) Signed: 07/25/2019 11:40:32 AM By: Army Melia Entered By: Army Melia on 07/25/2019 10:48:10 [name] ([id number]) -------------------------------------------------------------------------------- Encounter Discharge Information Details Patient Name: [name]. Date of Service: 07/25/2019 10:45 AM Medical Record Number: [id number] Patient Account Number: [id number] Date of Birth/Sex: 09/07/1946 (73 y.o. M) Treating RN: Montey Hora Primary Care Mirela Parsley: Glendon Axe Other Clinician: Referring Kriya Westra: Glendon Axe Treating Linzey Ramser/Extender: Melburn Hake, HOYT Weeks in Treatment: 32 Encounter Discharge Information Items Discharge Condition: Stable Ambulatory Status: Ambulatory Discharge Destination: Home Transportation: Private Auto Accompanied By: self Schedule Follow-up Appointment: Yes Clinical Summary of Care: Electronic Signature(s) Signed: 07/25/2019  4:49:07 PM By: Montey Hora Entered By: Montey Hora on 07/25/2019 11:09:14 [name] ([id number]) -------------------------------------------------------------------------------- Lower Extremity Assessment Details Patient Name: [name] Date of Service: 07/25/2019 10:45 AM Medical Record Number: [id number] Patient Account Number: [id number] Date of Birth/Sex: 1946/07/30 (73 y.o. M) Treating RN: Army Melia Primary Care Marketa Midkiff: Glendon Axe Other Clinician: Referring Anniebelle Devore: Glendon Axe Treating Audel Coakley/Extender: STONE III, HOYT Weeks in Treatment: 32 Edema Assessment Assessed: [Left: No] [Right: No] Edema: [Left: N] [Right: o] Vascular Assessment Pulses: Dorsalis Pedis Palpable: [Left:Yes] Electronic Signature(s) Signed: 07/25/2019 11:40:32 AM By: Army Melia Entered By: Army Melia on 07/25/2019 10:57:46 Palmer, [name] ([id number]) -------------------------------------------------------------------------------- Multi Wound Chart Details Patient Name: [name]. Date of Service: 07/25/2019 10:45 AM Medical Record Number: [id number] Patient Account Number: [id number] Date of Birth/Sex: [birth year] (73 y.o. M) Treating RN: Montey Hora Primary Care Azariya Freeman: Glendon Axe Other Clinician: Referring Lerin Jech: Glendon Axe Treating Dimitra Woodstock/Extender: STONE III, HOYT Weeks in Treatment: 32 Vital Signs Height(in): 72 Pulse(bpm): 73 Weight(lbs): 250 Blood Pressure(mmHg): 162/84 Body Mass Index(BMI): 34 Temperature(F): 98.2 Respiratory Rate 16 (breaths/min): Photos: [N/A:N/A] Wound Location: Left Toe Great N/A N/A Wounding Event: Gradually Appeared N/A N/A Primary Etiology: Diabetic Wound/Ulcer of the N/A N/A Lower Extremity Comorbid History: Hypertension, Type II Diabetes N/A N/A Date Acquired: 11/30/2017 N/A N/A Weeks of Treatment: 32 N/A N/A Wound Status: Open N/A N/A Wound Recurrence: Yes N/A  N/A Measurements L x W x D 0.1x0.1x0.1 N/A N/A (cm) Area (cm) : 0.008 N/A N/A Volume (cm) : 0.001 N/A N/A % Reduction in Area: 98.70% N/A N/A % Reduction in Volume: 98.40% N/A N/A Classification: Grade 2 N/A N/A Exudate Amount: None Present N/A N/A Wound Margin: Thickened N/A N/A Granulation Amount: None Present (0%) N/A N/A Necrotic Amount: None Present (0%) N/A N/A Exposed Structures: Fat Layer (Subcutaneous N/A N/A Tissue) Exposed: Yes Fascia: No Tendon: No Muscle: No Joint: No Bone: No Epithelialization: Large (67-100%) N/A N/A [name], [name] ([id number]) Treatment Notes Electronic Signature(s) Signed: 07/25/2019 4:49:07 PM By: Montey Hora Entered By: Montey Hora  on 07/25/2019 11:06:05 [name], [name] ([id number]) -------------------------------------------------------------------------------- [place] Details Patient Name: [name], [name]. Date of Service: 07/25/2019 10:45 AM Medical Record Number: [id number] Patient Account Number: [id number] Date of Birth/Sex: [birth year] (73 y.o. M) Treating RN: Montey Hora Primary Care Eriberto Felch: Glendon Axe Other Clinician: Referring Shon Mansouri: Glendon Axe Treating Areebah Meinders/Extender: STONE III, HOYT Weeks in Treatment: 38 Active Inactive Abuse / Safety / Falls / Self Care Management Nursing Diagnoses: Potential for falls Goals: Patient will remain injury free related to falls Date Initiated: 01/19/2019 Target Resolution Date: 07/29/2019 Goal Status: Active Interventions: Assess fall risk on admission and as needed Notes: Orientation to the Wound Care Program Nursing Diagnoses: Knowledge deficit related to the wound healing center program Goals: Patient/caregiver will verbalize understanding of the [facility] Program Date Initiated: 01/19/2019 Target Resolution Date: 08/11/2019 Goal Status: Active Interventions: Provide education on orientation to the wound  center Notes: Peripheral Neuropathy Nursing Diagnoses: Knowledge deficit related to disease process and management of peripheral neurovascular dysfunction Goals: Patient/caregiver will verbalize understanding of disease process and disease management Date Initiated: 03/17/2019 Target Resolution Date: 08/26/2019 Goal Status: Active Interventions: Assess signs and symptoms of neuropathy upon admission and as needed [name], [name] ([id number]) Provide education on Management of Neuropathy and Related Ulcers Notes: Wound/Skin Impairment Nursing Diagnoses: Impaired tissue integrity Goals: Ulcer/skin breakdown will have a volume reduction of 30% by week 4 Date Initiated: 12/12/2018 Target Resolution Date: 08/26/2019 Goal Status: Active Interventions: Assess patient/caregiver ability to obtain necessary supplies Assess patient/caregiver ability to perform ulcer/skin care regimen upon admission and as needed Notes: Electronic Signature(s) Signed: 07/25/2019 4:49:07 PM By: Montey Hora Entered By: Montey Hora on 07/25/2019 11:05:55 [name] ([id number]) -------------------------------------------------------------------------------- Pain Assessment Details Patient Name: [name]. Date of Service: 07/25/2019 10:45 AM Medical Record Number: [id number] Patient Account Number: [id number] Date of Birth/Sex: 1946-08-07 (73 y.o. M) Treating RN: Army Melia Primary Care Zalma Channing: Glendon Axe Other Clinician: Referring Keita [place]: Glendon Axe Treating Augustine Leverette/Extender: STONE III, HOYT Weeks in Treatment: 32 Active Problems Location of Pain Severity and Description of Pain Patient Has Paino No Site Locations Pain Management and Medication Current Pain Management: Electronic Signature(s) Signed: 07/25/2019 11:40:32 AM By: Army Melia Entered By: Army Melia on 07/25/2019 10:48:17 [name]  ([id number]) -------------------------------------------------------------------------------- Patient/Caregiver Education Details Patient Name: [name]. Date of Service: 07/25/2019 10:45 AM Medical Record Number: [id number] Patient Account Number: [id number] Date of Birth/Gender: Sep 20, 1946 (73 y.o. M) Treating RN: Montey Hora Primary Care Physician: Glendon Axe Other Clinician: Referring Physician: Glendon Axe Treating Physician/Extender: Sharalyn Ink in Treatment: 32 Education Assessment Education Provided To: Patient Education Topics Provided Offloading: Handouts: Other: TCC precautions Methods: Explain/Verbal Responses: State content correctly Electronic Signature(s) Signed: 07/25/2019 4:49:07 PM By: Montey Hora Entered By: Montey Hora on 07/25/2019 11:08:18 [name] ([id number]) -------------------------------------------------------------------------------- Wound Assessment Details Patient Name: [name]. Date of Service: 07/25/2019 10:45 AM Medical Record Number: [id number] Patient Account Number: [id number] Date of Birth/Sex: 30-Nov-1946 (73 y.o. M) Treating RN: Army Melia Primary Care Jeanise Durfey: Glendon Axe Other Clinician: Referring Diontre Harps: Glendon Axe Treating Chloris Marcoux/Extender: STONE III, HOYT Weeks in Treatment: 32 Wound Status Wound Number: [id number] Primary Etiology: Diabetic Wound/Ulcer of the Lower Extremity Wound Location: Left Toe Great Wound Status: Open Wounding Event: Gradually Appeared Comorbid Hypertension, Type II Diabetes Date Acquired: 11/30/2017 History: Weeks Of Treatment: 32 Clustered Wound: No Photos Wound Measurements Length: (cm) 0.1 % Reduction in Width: (cm) 0.1 % Reduction in Depth: (cm) 0.1 Epithelializati Area: (cm) 0.008  Tunneling: Volume: (cm) 0.001 Undermining: Area: 98.7% Volume: 98.4% on: Large (67-100%) No No Wound Description Classification: Grade  2 Foul Odor After Wound Margin: Thickened Slough/Fibrino Exudate Amount: None Present Cleansing: No Yes Wound Bed Granulation Amount: None Present (0%) Exposed Structure Necrotic Amount: None Present (0%) Fascia Exposed: No Fat Layer (Subcutaneous Tissue) Exposed: Yes Tendon Exposed: No Muscle Exposed: No Joint Exposed: No Bone Exposed: No Treatment Notes Wound #1R (Left Toe Great) Notes [name], [name] ([id number]) foam, TCC Electronic Signature(s) Signed: 07/25/2019 11:40:32 AM By: Army Melia Entered By: Army Melia on 07/25/2019 10:57:25 Motter, [name] ([id number]) -------------------------------------------------------------------------------- Vitals Details Patient Name: [name]. Date of Service: 07/25/2019 10:45 AM Medical Record Number: [id number] Patient Account Number: [id number] Date of Birth/Sex: Mar 13, 1946 (73 y.o. M) Treating RN: Army Melia Primary Care Shiya Fogelman: Glendon Axe Other Clinician: Referring Alejandria Wessells: Glendon Axe Treating Latunya Kissick/Extender: STONE III, HOYT Weeks in Treatment: 32 Vital Signs Time Taken: 10:48 Temperature (F): 98.2 Height (in): 72 Pulse (bpm): 73 Weight (lbs): 250 Respiratory Rate (breaths/min): 16 Body Mass Index (BMI): 33.9 Blood Pressure (mmHg): 162/84 Reference Range: 80 - 120 mg / dl Electronic Signature(s) Signed: 07/25/2019 11:40:32 AM By: Army Melia Entered By: Army Melia on 07/25/2019 10:48:37

## 2019-07-26 NOTE — Progress Notes (Signed)
ALIZE, ACY ([id number]) Visit Report for 07/25/2019 Chief Complaint Document Details Patient Name: [name], [name]. Date of Service: 07/25/2019 10:45 AM Medical Record Number: [id number] Patient Account Number: [id number] Date of Birth/Sex: 1946-05-01 (73 y.o. M) Treating RN: Montey Hora Primary Care Provider: Glendon Axe Other Clinician: Referring Provider: Glendon Axe Treating Provider/Extender: STONE III, HOYT Weeks in Treatment: 32 Information Obtained from: Patient Chief Complaint Left great toe ulcer Electronic Signature(s) Signed: 07/26/2019 9:47:59 AM By: Worthy Keeler PA-C Entered By: Worthy Keeler on 07/26/2019 01:38:35 [place], [name] ([id number]) -------------------------------------------------------------------------------- HPI Details Patient Name: [name] Date of Service: 07/25/2019 10:45 AM Medical Record Number: [id number] Patient Account Number: [id number] Date of Birth/Sex: 1946/03/20 (73 y.o. M) Treating RN: Montey Hora Primary Care Provider: Glendon Axe Other Clinician: Referring Provider: Glendon Axe Treating Provider/Extender: Melburn Hake, HOYT Weeks in Treatment: 32 History of Present Illness HPI Description: 12/12/18 on evaluation today patient presents as a referral from his endocrinologist regarding issues that he has been having with his left great toe. Subsequently he has been seeing Dr. Cleda Mccreedy and Dr. Cleda Mccreedy has been the breeding the wound and in fact this was debrided this morning. The patient had an appointment there prior to coming here. Subsequently he states that even though he's been going for regular debridement after office things really do not seem to be showing signs of improvement unfortunately. He states that he is having some discomfort but nothing too significant. No fevers, chills, nausea, or vomiting noted at this time. The patient does have a history of hypertension, neuropathy,  diabetes mellitus type II, and a history of stroke. Currently he's been using bacitracin on the toe and utilizing a Band-Aid. He tells me he's had this wound for two years. He cannot remember the last time he had an x-ray. 12/19/18 on evaluation today patient appears to be doing well in regard to his plantar toe ulcer. He's been tolerating the dressing changes without complication. Fortunately there does not appear to be signs of infection at this time which is good news. No fever chills noted. 01/19/19 has been one month since I last saw the patient as he was out of town. With that being said on evaluation today it does appear that he is going to require sharp debridement and I do believe that the total contact cast would benefit him as far as being applied at this time. He is in agreement that plan. 01/23/19 on evaluation today patient appears to be doing better in regard to his left great toe ulcer. He has been shown signs of improvement week by week and although this is slow it does seem to be doing fairly well which is good news. Overall very pleased in this regard. 01/31/19 on evaluation today patient presents for follow-up concerning his great toe ulcer. He was actually supposed to be here yesterday and he did come however he ended up having to leave due to his blood sugar dropping he states he just did not eat enough lunch. Fortunately seems to be doing much better today which is excellent news. Fortunately there's no evidence of infection at this time which is also good news. No fevers, chills, nausea, or vomiting noted at this time. Overall I feel like he is making excellent progress. 02/06/19 on evaluation today patient actually appears to be doing very well in regard to his plantar toe ulcer. He's been tolerating the dressing changes without complication. Fortunately there is no sign of infection at this time. Overall been  very pleased with how things seem to be progressing. No fevers,  chills, nausea, or vomiting noted at this time. 02/13/19 on evaluation today patient actually appears to be doing excellent in regard to his toe ulcer which is almost completely close. Overall very pleased with that. There does not appear to be any signs of infection which is good news. Upon close inspection it appears that he has just a very slight opening still noted at the central portion of the toe although this is minimal. 02/16/19 on evaluation today patient is seen for early follow-up due to the fact that he tells me while at home he actually got up to go answer the door without putting on the walking boot part of his cast and subsequently damaged the total contact cast. It therefore comes in to have this removed and potentially replaced. Fortunately other than it given him a little bit of pain its far as pushing in on some areas there doesn't appear to be any injury once the cast was removed. 03/13/19 on evaluation today patient unfortunately presents for follow-up concerning his great toe ulcer on the left. He was discharged on 02/16/19 with the wound healed at that point. He tells me this remain so for several weeks or least a couple weeks before reopening. Fortunately there does not appear to be any signs of infection at this time. Nonetheless for the past week at least he's had the wound reopened and states it is been trying to keep pressure off of this but he has not been using the offloading shoe we previously gave him. Fortunately there's no evidence of infection at this time. No fevers, chills, nausea, or vomiting noted at this time. HAITHAM, DOLINSKY ([id number]) 03/17/19 on evaluation today patient actually appears to be doing rather well in regard to his toe also. Fact this appears to be significantly smaller this week even compared to what I saw last week on evaluation. He seems to have done very well for the offloading shoe and overall I'm very pleased with the progress that is  made. It may be that he doesn't even need the cast to get this to heal if he offloads this appropriately. 03/24/19 on evaluation today patient actually appears to be doing well in regard to his plantar foot ulcer. He's been tolerating the dressing changes without complication the collagen does seem to be beneficial for him. With that being said he is gonna require some miles sharp debridement today to clear away some of the callous's around the edge of the wound as well as the minimal Slough noted on the surface of the wound. 03/31/19 on evaluation today patient appears to be doing well in regard to his plantar great toe ulcer. He has been tolerating the dressing changes without complication which is good news. Fortunately there does not appear to be any signs of active infection at this time. Overall very pleased with how things seem to be progressing. 04/11/19 on evaluation today patient's tools are appears to be doing in my pinion roughly about the same as were things that previous. Fortunately there does not appear to be signs of active infection at this time which is good news. With that being said he has not been experiencing any pain at this time which is good news there is also No fevers, chills, nausea, or vomiting noted at this time. 04/18/19 on evaluation today patient's wound actually appears to be doing fairly well today he did not even really have a lot of  callous buildup. We have been debating on whether or not to reinitiate total contact cast. With that being said he seems to be doing fairly well this point with offloading in my pinion and again I think we need to come up with a way to get this healed that will also be sustainable for keeping it close. Again we were able to close it with the cast previous but again he has to be able to keep it such. For that reason we are working on trying to get him diabetic shoes he's waiting for the [place] to get in touch with the local company that has  to measure him for these do not want to have a cast on at such a time as when they need to actually measure him. 04/25/19 on evaluation today patient appears to be doing well in regard to his plantar foot specifically great toe ulcer. He's been tolerating the dressing changes without complication. Fortunately there's no signs of active infection at this time. Overall I'm very pleased with how things appear currently. 05/02/19 on evaluation today patient appears to actually be doing better in regard to his plantar foot ulcer. He shown signs of good improvement which is excellent news. She states he can walk better with the cast on the can with the offloading shoe that he had on previous. Fortunately there's no signs of active infection at this time. No fevers, chills, nausea, or vomiting noted at this time. 05/09/19 on evaluation today patient actually appears to be doing excellent. In fact he appears to be completely healed based on evaluation at this time. Fortunately there's no signs of infection and is having no discomfort. 06/08/19 this is a follow-up visit although the patient has been healed for almost a month but not quite. He tells me that in the past several days he noted some blood coming from his toe and he thought he should come in at this checked out. Fortunately he's having a pain. He also did get his custom orthotics shoes. With that being said he still appears to have developed this ulceration there is something he tells me that the orthotic specialist stated they could do to the toes to try to help out in this regard although he did not know exactly what it was we may need to [facility] with [facility]. 06/12/19 on evaluation today patient actually appears to be doing great in regard to his foot ulcer. The wound on his toes shown signs of excellent improvement which is great news. With that being said he is not completely close yet the cast has done wonders for him and he is definitely  headed in the correct direction. No fevers, chills, nausea, or vomiting noted at this time. 06/22/19 on evaluation today fortunately patient actually appears to be healing quite well and in fact appears to be completely healed this is excellent news. Fortunately there's no signs of active infection he's been tolerating the total contact cast without complication. Overall I'm pleased with the progress that has been made. He also has his new custom shoes he brought was with him today. 07/04/19 on evaluation today patient actually appears to be doing a little bit worse in regard his toe ulcer unfortunately this has yet again reopened. It has only been about a week and a half since I've last seen him I really did not expect this to reopen this quickly. Nonetheless I did discuss with him his normal routine in order to see what may be causing this.  He has custom shoes. With that being said he apparently has not been using the custom shoes and even when he has used them he has not TAISHAWN, SMALDONE. ([id number]) even put the insert in there which is what was custom molded to him in order to appropriately offload high-pressure point areas. In fact his exact question to me was whether or not he needed to actually put those in they just came loose in the box and he had never installed them. Nonetheless it also came to light the he's been walking around in his bedroom slippers at home he tells me he does not go barefoot but he really has not been wearing his custom offloading/diabetic shoes. Nonetheless I do believe that this is why he continually reopens as far as the wound is concerned. I discussed this with the patient in great detail today for yet again although I previously had this discussion with him in the past. 07/11/19-Patient returns at 1 week for his left great toe wound which is actually doing really well, he has been using the insert to offload the toe and is very pleased with it. This looks  like this is working out really well for him 07/18/19 on evaluation today patient appears to be doing really about the same with regard to his ulcer on the right great toe. He's been tolerating the dressing changes without complication. Fortunately there's no signs of active infection at this time. With that being said I think that we're having a difficult time getting this to heal and I do believe that initiating treatment with the total contact cast to get this to close would be a good idea. The good news is he seems to be keeping this from worsening so hopefully that means that if we get it closed he can prevent this from reopening in the future. At this point my suggestion was that we go ahead and initiate treatment at both sites utilizing collagen. We will then subsequently put a piece of silver out and it between the toes of the right foot in the webbing between the fourth and fifth toe in order to help keep things dry and allow this to hopefully close and we heal without complication. The patient is in agreement with plan. Subsequently I am hopeful that both wounds will heal quite quickly we have been very close now with the ankle and again the toe has reopened but fortunately doesn't appear to be to bad at this point. No antibiotics were prescribed today as they did not appear to be necessary. 07/25/19 on evaluation today patient actually appears to be doing excellent in regard to his great toe ulcer. In fact this really appears to be almost completely healed if indeed it is not in fact healed. With that being said there is still the eighth small pinpoint area that could be open although I tend to doubt it. Nonetheless it is something that I would like to continue to watch for more week I do think it would be beneficial for [place] to go ahead and continue with the cast for one more week before deciding to discharge him especially in light of the issues he's had in the past. Specifically with regard  to the wound reopening rather quickly. Electronic Signature(s) Signed: 07/26/2019 9:47:59 AM By: Worthy Keeler PA-C Entered By: Worthy Keeler on 07/26/2019 01:40:13 [place], [name] ([id number]) -------------------------------------------------------------------------------- Physical Exam Details Patient Name: [name] Date of Service: 07/25/2019 10:45 AM Medical Record Number: [id number]  Patient Account Number: [id number] Date of Birth/Sex: 1946/09/26 (73 y.o. M) Treating RN: Montey Hora Primary Care Provider: Glendon Axe Other Clinician: Referring Provider: Glendon Axe Treating Provider/Extender: STONE III, HOYT Weeks in Treatment: 38 Constitutional Well-nourished and well-hydrated in no acute distress. Respiratory normal breathing without difficulty. Psychiatric this patient is able to make decisions and demonstrates good insight into disease process. Alert and Oriented x 3. pleasant and cooperative. Notes Patient's wound again shows a very small pinpoint area that could potentially have an open spot right in the middle although again I'm not 100% convinced of this. Either way I want to go ahead and reapply the total contact cast today to give this one more week before considering complete discontinuation of the total contact cast. Electronic Signature(s) Signed: 07/26/2019 9:47:59 AM By: Worthy Keeler PA-C Entered By: Worthy Keeler on 07/26/2019 01:41:21 [place], [name] ([id number]) -------------------------------------------------------------------------------- Physician Orders Details Patient Name: [name]. Date of Service: 07/25/2019 10:45 AM Medical Record Number: [id number] Patient Account Number: [id number] Date of Birth/Sex: [birth year] (73 y.o. M) Treating RN: Montey Hora Primary Care Provider: Glendon Axe Other Clinician: Referring Provider: Glendon Axe Treating Provider/Extender: STONE III, HOYT Weeks  in Treatment: 37 Verbal / Phone Orders: No Diagnosis Coding Wound Cleansing Wound #1R Left Toe Great o Clean wound with Normal Saline. o May shower with protection. - Do not get the cast wet Secondary Dressing Wound #1R Left Toe Great o Foam Dressing Change Frequency Wound #1R Left Toe Great o Change dressing every week Follow-up Appointments Wound #1R Left Toe Great o Return Appointment in 1 week. Off-Loading Wound #1R Left Toe Great o Total Contact Cast to Left Lower Extremity Electronic Signature(s) Signed: 07/25/2019 4:49:07 PM By: Montey Hora Signed: 07/26/2019 9:47:59 AM By: Worthy Keeler PA-C Entered By: Montey Hora on 07/25/2019 11:06:58 [place], [name] ([id number]) -------------------------------------------------------------------------------- Problem List Details Patient Name: KARIN, GRIFFITH. Date of Service: 07/25/2019 10:45 AM Medical Record Number: [id number] Patient Account Number: [id number] Date of Birth/Sex: 05/05/1946 (73 y.o. 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 I10 Essential (primary) hypertension 12/12/2018 No Yes E11.43 Type 2 diabetes mellitus with diabetic autonomic (poly) 12/12/2018 No Yes neuropathy Inactive Problems Resolved Problems Electronic Signature(s) Signed: 07/26/2019 9:47:59 AM By: Worthy Keeler PA-C Entered By: Worthy Keeler on 07/26/2019 01:38:27 [place], [name] ([id number]) -------------------------------------------------------------------------------- Progress Note Details Patient  Name: [name] Date of Service: 07/25/2019 10:45 AM Medical Record Number: [id number] Patient Account Number: [id number] Date of Birth/Sex: [birth year] (73 y.o. M) Treating RN: Montey Hora Primary Care Provider: Glendon Axe Other Clinician: Referring Provider: Glendon Axe Treating Provider/Extender: Melburn Hake, HOYT Weeks in Treatment: 32 Subjective Chief Complaint Information obtained from Patient Left great toe ulcer History of Present Illness (HPI) 12/12/18 on evaluation today patient presents as a referral from his endocrinologist regarding issues that he has been having with his left great toe. Subsequently he has been seeing Dr. Cleda Mccreedy and Dr. Cleda Mccreedy has been the breeding the wound and in fact this was debrided this morning. The patient had an appointment there prior to coming here. Subsequently he states that even though he's been going for regular debridement after office  things really do not seem to be showing signs of improvement unfortunately. He states that he is having some discomfort but nothing too significant. No fevers, chills, nausea, or vomiting noted at this time. The patient does have a history of hypertension, neuropathy, diabetes mellitus type II, and a history of stroke. Currently he's been using bacitracin on the toe and utilizing a Band-Aid. He tells me he's had this wound for two years. He cannot remember the last time he had an x-ray. 12/19/18 on evaluation today patient appears to be doing well in regard to his plantar toe ulcer. He's been tolerating the dressing changes without complication. Fortunately there does not appear to be signs of infection at this time which is good news. No fever chills noted. 01/19/19 has been one month since I last saw the patient as he was out of town. With that being said on evaluation today it does appear that he is going to require sharp debridement and I do believe that the total contact cast would benefit him  as far as being applied at this time. He is in agreement that plan. 01/23/19 on evaluation today patient appears to be doing better in regard to his left great toe ulcer. He has been shown signs of improvement week by week and although this is slow it does seem to be doing fairly well which is good news. Overall very pleased in this regard. 01/31/19 on evaluation today patient presents for follow-up concerning his great toe ulcer. He was actually supposed to be here yesterday and he did come however he ended up having to leave due to his blood sugar dropping he states he just did not eat enough lunch. Fortunately seems to be doing much better today which is excellent news. Fortunately there's no evidence of infection at this time which is also good news. No fevers, chills, nausea, or vomiting noted at this time. Overall I feel like he is making excellent progress. 02/06/19 on evaluation today patient actually appears to be doing very well in regard to his plantar toe ulcer. He's been tolerating the dressing changes without complication. Fortunately there is no sign of infection at this time. Overall been very pleased with how things seem to be progressing. No fevers, chills, nausea, or vomiting noted at this time. 02/13/19 on evaluation today patient actually appears to be doing excellent in regard to his toe ulcer which is almost completely close. Overall very pleased with that. There does not appear to be any signs of infection which is good news. Upon close inspection it appears that he has just a very slight opening still noted at the central portion of the toe although this is minimal. 02/16/19 on evaluation today patient is seen for early follow-up due to the fact that he tells me while at home he actually got up to go answer the door without putting on the walking boot part of his cast and subsequently damaged the total contact cast. It therefore comes in to have this removed and potentially  replaced. Fortunately other than it given him a little bit of pain its far as pushing in on some areas there doesn't appear to be any injury once the cast was removed. KAMERAN, MCNEESE ([id number]) 03/13/19 on evaluation today patient unfortunately presents for follow-up concerning his great toe ulcer on the left. He was discharged on 02/16/19 with the wound healed at that point. He tells me this remain so for several weeks or least a couple weeks before reopening. Fortunately  there does not appear to be any signs of infection at this time. Nonetheless for the past week at least he's had the wound reopened and states it is been trying to keep pressure off of this but he has not been using the offloading shoe we previously gave him. Fortunately there's no evidence of infection at this time. No fevers, chills, nausea, or vomiting noted at this time. 03/17/19 on evaluation today patient actually appears to be doing rather well in regard to his toe also. Fact this appears to be significantly smaller this week even compared to what I saw last week on evaluation. He seems to have done very well for the offloading shoe and overall I'm very pleased with the progress that is made. It may be that he doesn't even need the cast to get this to heal if he offloads this appropriately. 03/24/19 on evaluation today patient actually appears to be doing well in regard to his plantar foot ulcer. He's been tolerating the dressing changes without complication the collagen does seem to be beneficial for him. With that being said he is gonna require some miles sharp debridement today to clear away some of the callous's around the edge of the wound as well as the minimal Slough noted on the surface of the wound. 03/31/19 on evaluation today patient appears to be doing well in regard to his plantar great toe ulcer. He has been tolerating the dressing changes without complication which is good news. Fortunately there does  not appear to be any signs of active infection at this time. Overall very pleased with how things seem to be progressing. 04/11/19 on evaluation today patient's tools are appears to be doing in my pinion roughly about the same as were things that previous. Fortunately there does not appear to be signs of active infection at this time which is good news. With that being said he has not been experiencing any pain at this time which is good news there is also No fevers, chills, nausea, or vomiting noted at this time. 04/18/19 on evaluation today patient's wound actually appears to be doing fairly well today he did not even really have a lot of callous buildup. We have been debating on whether or not to reinitiate total contact cast. With that being said he seems to be doing fairly well this point with offloading in my pinion and again I think we need to come up with a way to get this healed that will also be sustainable for keeping it close. Again we were able to close it with the cast previous but again he has to be able to keep it such. For that reason we are working on trying to get him diabetic shoes he's waiting for the [place] to get in touch with the local company that has to measure him for these do not want to have a cast on at such a time as when they need to actually measure him. 04/25/19 on evaluation today patient appears to be doing well in regard to his plantar foot specifically great toe ulcer. He's been tolerating the dressing changes without complication. Fortunately there's no signs of active infection at this time. Overall I'm very pleased with how things appear currently. 05/02/19 on evaluation today patient appears to actually be doing better in regard to his plantar foot ulcer. He shown signs of good improvement which is excellent news. She states he can walk better with the cast on the can with the offloading shoe that he  had on previous. Fortunately there's no signs of active infection  at this time. No fevers, chills, nausea, or vomiting noted at this time. 05/09/19 on evaluation today patient actually appears to be doing excellent. In fact he appears to be completely healed based on evaluation at this time. Fortunately there's no signs of infection and is having no discomfort. 06/08/19 this is a follow-up visit although the patient has been healed for almost a month but not quite. He tells me that in the past several days he noted some blood coming from his toe and he thought he should come in at this checked out. Fortunately he's having a pain. He also did get his custom orthotics shoes. With that being said he still appears to have developed this ulceration there is something he tells me that the orthotic specialist stated they could do to the toes to try to help out in this regard although he did not know exactly what it was we may need to check with [facility]. 06/12/19 on evaluation today patient actually appears to be doing great in regard to his foot ulcer. The wound on his toes shown signs of excellent improvement which is great news. With that being said he is not completely close yet the cast has done wonders for him and he is definitely headed in the correct direction. No fevers, chills, nausea, or vomiting noted at this time. 06/22/19 on evaluation today fortunately patient actually appears to be healing quite well and in fact appears to be completely healed this is excellent news. Fortunately there's no signs of active infection he's been tolerating the total contact cast without complication. Overall I'm pleased with the progress that has been made. He also has his new custom shoes he brought was MICKLE, CAMPTON ([id number]) with him today. 07/04/19 on evaluation today patient actually appears to be doing a little bit worse in regard his toe ulcer unfortunately this has yet again reopened. It has only been about a week and a half since I've last seen him I  really did not expect this to reopen this quickly. Nonetheless I did discuss with him his normal routine in order to see what may be causing this. He has custom shoes. With that being said he apparently has not been using the custom shoes and even when he has used them he has not even put the insert in there which is what was custom molded to him in order to appropriately offload high-pressure point areas. In fact his exact question to me was whether or not he needed to actually put those in they just came loose in the box and he had never installed them. Nonetheless it also came to light the he's been walking around in his bedroom slippers at home he tells me he does not go barefoot but he really has not been wearing his custom offloading/diabetic shoes. Nonetheless I do believe that this is why he continually reopens as far as the wound is concerned. I discussed this with the patient in great detail today for yet again although I previously had this discussion with him in the past. 07/11/19-Patient returns at 1 week for his left great toe wound which is actually doing really well, he has been using the insert to offload the toe and is very pleased with it. This looks like this is working out really well for him 07/18/19 on evaluation today patient appears to be doing really about the same with regard to his ulcer on the  right great toe. He's been tolerating the dressing changes without complication. Fortunately there's no signs of active infection at this time. With that being said I think that we're having a difficult time getting this to heal and I do believe that initiating treatment with the total contact cast to get this to close would be a good idea. The good news is he seems to be keeping this from worsening so hopefully that means that if we get it closed he can prevent this from reopening in the future. At this point my suggestion was that we go ahead and initiate treatment at both sites  utilizing collagen. We will then subsequently put a piece of silver out and it between the toes of the right foot in the webbing between the fourth and fifth toe in order to help keep things dry and allow this to hopefully close and we heal without complication. The patient is in agreement with plan. Subsequently I am hopeful that both wounds will heal quite quickly we have been very close now with the ankle and again the toe has reopened but fortunately doesn't appear to be to bad at this point. No antibiotics were prescribed today as they did not appear to be necessary. 07/25/19 on evaluation today patient actually appears to be doing excellent in regard to his great toe ulcer. In fact this really appears to be almost completely healed if indeed it is not in fact healed. With that being said there is still the eighth small pinpoint area that could be open although I tend to doubt it. Nonetheless it is something that I would like to continue to watch for more week I do think it would be beneficial for [place] to go ahead and continue with the cast for one more week before deciding to discharge him especially in light of the issues he's had in the past. Specifically with regard to the wound reopening rather quickly. Patient History Information obtained from Patient. Family History Diabetes - Mother, Heart Disease - Father, Stroke - Father, No family history of Cancer, Hereditary Spherocytosis, Hypertension, Kidney Disease, Lung Disease, Seizures, Thyroid Problems, Tuberculosis. Social History Never smoker, Marital Status - Widowed, Alcohol Use - Never, Drug Use - No History, Caffeine Use - Moderate. Medical History Eyes Denies history of Cataracts, Glaucoma, Optic Neuritis Ear/Nose/Mouth/Throat Denies history of Chronic sinus problems/congestion, Middle ear problems Hematologic/Lymphatic Denies history of Anemia, Hemophilia, Human Immunodeficiency Virus, Lymphedema Respiratory Denies history of  Aspiration, Asthma, Chronic Obstructive Pulmonary Disease (COPD), Pneumothorax, Sleep Apnea, Tuberculosis Cardiovascular Patient has history of Hypertension Denies history of Angina, Arrhythmia, Congestive Heart Failure, Coronary Artery Disease, Deep Vein Thrombosis, Moser, Tayvion L. ([id number]) Hypotension, Myocardial Infarction, Peripheral Arterial Disease, Peripheral Venous Disease, Phlebitis, Vasculitis Gastrointestinal Denies history of Cirrhosis , Colitis, Crohn s, Hepatitis A, Hepatitis B, Hepatitis C Endocrine Patient has history of Type II Diabetes Denies history of Type I Diabetes Genitourinary Denies history of End Stage Renal Disease Immunological Denies history of Lupus Erythematosus, Raynaud s, Scleroderma Integumentary (Skin) Denies history of History of Burn, History of pressure wounds Musculoskeletal Denies history of Gout, Rheumatoid Arthritis, Osteoarthritis, Osteomyelitis Neurologic Denies history of Dementia, Neuropathy, Quadriplegia, Paraplegia, Seizure Disorder Oncologic Denies history of Received Chemotherapy, Received Radiation Review of Systems (ROS) Constitutional Symptoms (General Health) Denies complaints or symptoms of Fatigue, Fever, Chills, Marked Weight Change. Respiratory Denies complaints or symptoms of Chronic or frequent coughs, Shortness of Breath. Cardiovascular Denies complaints or symptoms of Chest pain, LE edema. Psychiatric Denies complaints or symptoms of  Anxiety, Claustrophobia. Objective Constitutional Well-nourished and well-hydrated in no acute distress. Vitals Time Taken: 10:48 AM, Height: 72 in, Weight: 250 lbs, BMI: 33.9, Temperature: 98.2 F, Pulse: 73 bpm, Respiratory Rate: 16 breaths/min, Blood Pressure: 162/84 mmHg. Respiratory normal breathing without difficulty. Psychiatric this patient is able to make decisions and demonstrates good insight into disease process. Alert and Oriented x 3. pleasant and  cooperative. General Notes: Patient's wound again shows a very small pinpoint area that could potentially have an open spot right in the middle although again I'm not 100% convinced of this. Either way I want to go ahead and reapply the total contact cast today to give this one more week before considering complete discontinuation of the total contact cast. Integumentary (Hair, Skin) EBBIE, CHERRY. ([id number]) Wound #1R status is Open. Original cause of wound was Gradually Appeared. The wound is located on the Left Toe Great. The wound measures 0.1cm length x 0.1cm width x 0.1cm depth; 0.008cm^2 area and 0.001cm^3 volume. There is Fat Layer (Subcutaneous Tissue) Exposed exposed. There is no tunneling or undermining noted. There is a none present amount of drainage noted. The wound margin is thickened. There is no granulation within the wound bed. There is no necrotic tissue within the wound bed. Assessment Active Problems ICD-10 Type 2 diabetes mellitus with foot ulcer Non-pressure chronic ulcer of other part of left foot with fat layer exposed Essential (primary) hypertension Type 2 diabetes mellitus with diabetic autonomic (poly)neuropathy Procedures Wound #1R Pre-procedure diagnosis of Wound #1R is a Diabetic Wound/Ulcer of the Lower Extremity located on the Left Toe Great . There was a Total Contact Cast Procedure by STONE III, HOYT E., PA-C. Post procedure Diagnosis Wound #1R: Same as Pre-Procedure Plan Wound Cleansing: Wound #1R Left Toe Great: Clean wound with Normal Saline. May shower with protection. - Do not get the cast wet Secondary Dressing: Wound #1R Left Toe Great: Foam Dressing Change Frequency: Wound #1R Left Toe Great: Change dressing every week Follow-up Appointments: Wound #1R Left Toe Great: Return Appointment in 1 week. Off-Loading: Wound #1R Left Toe Great: Total Contact Cast to Left Lower Extremity CORRELL, DENBOW ([id number]) At this  time I did personally reapply the total contact cast on the patient today which he tolerated without complication. Post application the cast appears to be well formed with no signs of complication. We will subsequently see how things progress over the next week. I fully expect him to come out of the cast fully healed next week and then we will proceed and see were things moved from there. If anything changes in the meantime he will contact the office let me know otherwise will see him back for reevaluation in one weeks time. Please see above for specific wound care orders. We will see patient for re-evaluation in 1 week(s) here in the clinic. If anything worsens or changes patient will contact our office for additional recommendations. Electronic Signature(s) Signed: 07/26/2019 9:47:59 AM By: Worthy Keeler PA-C Entered By: Worthy Keeler on 07/26/2019 01:42:41 [place], [name] ([id number]) -------------------------------------------------------------------------------- ROS/PFSH Details Patient Name: [name] Date of Service: 07/25/2019 10:45 AM Medical Record Number: [id number] Patient Account Number: [id number] Date of Birth/Sex: February 20, 1946 (73 y.o. M) Treating RN: Montey Hora Primary Care Provider: Glendon Axe Other Clinician: Referring Provider: Glendon Axe Treating Provider/Extender: STONE III, HOYT Weeks in Treatment: 32 Information Obtained From Patient Constitutional Symptoms (General Health) Complaints and Symptoms: Negative for: Fatigue; Fever; Chills; Marked Weight Change Respiratory Complaints and Symptoms: Negative for:  Chronic or frequent coughs; Shortness of Breath Medical History: Negative for: Aspiration; Asthma; Chronic Obstructive Pulmonary Disease (COPD); Pneumothorax; Sleep Apnea; Tuberculosis Cardiovascular Complaints and Symptoms: Negative for: Chest pain; LE edema Medical History: Positive for: Hypertension Negative for:  Angina; Arrhythmia; Congestive Heart Failure; Coronary Artery Disease; Deep Vein Thrombosis; Hypotension; Myocardial Infarction; Peripheral Arterial Disease; Peripheral Venous Disease; Phlebitis; Vasculitis Psychiatric Complaints and Symptoms: Negative for: Anxiety; Claustrophobia Eyes Medical History: Negative for: Cataracts; Glaucoma; Optic Neuritis Ear/Nose/Mouth/Throat Medical History: Negative for: Chronic sinus problems/congestion; Middle ear problems Hematologic/Lymphatic Medical History: Negative for: Anemia; Hemophilia; Human Immunodeficiency Virus; Lymphedema Gastrointestinal DEIONTE, SPIVACK ([id number]) Medical History: Negative for: Cirrhosis ; Colitis; Crohnos; Hepatitis A; Hepatitis B; Hepatitis C Endocrine Medical History: Positive for: Type II Diabetes Negative for: Type I Diabetes Time with diabetes: 1990 Treated with: Insulin Blood sugar tested every day: Yes Tested : Genitourinary Medical History: Negative for: End Stage Renal Disease Immunological Medical History: Negative for: Lupus Erythematosus; Raynaudos; Scleroderma Integumentary (Skin) Medical History: Negative for: History of Burn; History of pressure wounds Musculoskeletal Medical History: Negative for: Gout; Rheumatoid Arthritis; Osteoarthritis; Osteomyelitis Neurologic Medical History: Negative for: Dementia; Neuropathy; Quadriplegia; Paraplegia; Seizure Disorder Oncologic Medical History: Negative for: Received Chemotherapy; Received Radiation Immunizations Pneumococcal Vaccine: Received Pneumococcal Vaccination: Yes Implantable Devices No devices added Family and Social History Cancer: No; Diabetes: Yes - Mother; Heart Disease: Yes - Father; Hereditary Spherocytosis: No; Hypertension: No; Kidney Disease: No; Lung Disease: No; Seizures: No; Stroke: Yes - Father; Thyroid Problems: No; Tuberculosis: No; Never smoker; Marital Status - Widowed; Alcohol Use: Never; Drug Use: No  History; Caffeine Use: Moderate; Financial Concerns: No; Food, Clothing or Shelter Needs: No; Support System Lacking: No; Transportation Concerns: No Physician Affirmation I have reviewed and agree with the above information. DELYLE, WEIDER ([id number]) Electronic Signature(s) Signed: 07/26/2019 9:47:59 AM By: Worthy Keeler PA-C Signed: 07/26/2019 4:41:53 PM By: Montey Hora Entered By: Worthy Keeler on 07/26/2019 01:40:37 [place], [name] ([id number]) -------------------------------------------------------------------------------- Total Contact Cast Details Patient Name: JACHOB, MCCLEAN Date of Service: 07/25/2019 10:45 AM Medical Record Number: [id number] Patient Account Number: [id number] Date of Birth/Sex: 1946/09/06 (73 y.o. M) Treating RN: Montey Hora Primary Care Provider: Glendon Axe Other Clinician: Referring Provider: Glendon Axe Treating Provider/Extender: STONE III, HOYT Weeks in Treatment: 32 Total Contact Cast Applied for Wound Assessment: Wound #1R Left Toe Great Performed By: Physician Emilio Math., PA-C Post Procedure Diagnosis Same as Pre-procedure Electronic Signature(s) Signed: 07/25/2019 4:49:07 PM By: Montey Hora Signed: 07/26/2019 9:47:59 AM By: Worthy Keeler PA-C Entered By: Montey Hora on 07/25/2019 03:47:42 [place] ([id number]) -------------------------------------------------------------------------------- SuperBill Details Patient Name: [name]. Date of Service: 07/25/2019 Medical Record Number: [id number] Patient Account Number: [id number] Date of Birth/Sex: 10-02-1946 (73 y.o. M) Treating RN: Montey Hora Primary Care Provider: Glendon Axe Other Clinician: Referring Provider: Glendon Axe Treating Provider/Extender: Melburn Hake, HOYT Weeks in Treatment: 32 Diagnosis Coding ICD-10 Codes Code Description E11.621 Type 2 diabetes mellitus with foot ulcer L97.522  Non-pressure chronic ulcer of other part of left foot with fat layer exposed I10 Essential (primary) hypertension E11.43 Type 2 diabetes mellitus with diabetic autonomic (poly)neuropathy Facility Procedures CPT4 Code: 41660630 Description: [phone] - APPLY TOTAL CONTACT LEG CAST ICD-10 Diagnosis Description E11.621 Type 2 diabetes mellitus with foot ulcer L97.522 Non-pressure chronic ulcer of other part of left foot with fat Modifier: layer exposed Quantity: 1 Physician Procedures CPT4 Code: [id number] Description: 32202 - WC PHYS APPLY TOTAL CONTACT CAST ICD-10 Diagnosis Description E11.621 Type 2  diabetes mellitus with foot ulcer L97.522 Non-pressure chronic ulcer of other part of left foot with fat Modifier: layer exposed Quantity: 1 Electronic Signature(s) Signed: 07/26/2019 9:47:59 AM By: Worthy Keeler PA-C Previous Signature: 07/25/2019 4:49:07 PM Version By: Montey Hora Entered By: Worthy Keeler on 07/26/2019 01:43:11

## 2019-08-01 ENCOUNTER — Encounter: Payer: Medicare Other | Attending: Physician Assistant | Admitting: Physician Assistant

## 2019-08-01 ENCOUNTER — Other Ambulatory Visit: Payer: Self-pay

## 2019-08-01 DIAGNOSIS — L97522 Non-pressure chronic ulcer of other part of left foot with fat layer exposed: Secondary | ICD-10-CM | POA: Insufficient documentation

## 2019-08-01 DIAGNOSIS — E1143 Type 2 diabetes mellitus with diabetic autonomic (poly)neuropathy: Secondary | ICD-10-CM | POA: Diagnosis not present

## 2019-08-01 DIAGNOSIS — I10 Essential (primary) hypertension: Secondary | ICD-10-CM | POA: Insufficient documentation

## 2019-08-01 DIAGNOSIS — E11621 Type 2 diabetes mellitus with foot ulcer: Secondary | ICD-10-CM | POA: Insufficient documentation

## 2019-08-01 DIAGNOSIS — Z8673 Personal history of transient ischemic attack (TIA), and cerebral infarction without residual deficits: Secondary | ICD-10-CM | POA: Diagnosis not present

## 2019-08-01 NOTE — Progress Notes (Addendum)
JAYMIN, WALN ([id number]) Visit Report for 08/01/2019 Chief Complaint Document Details Patient Name: [name], CIANCI. Date of Service: 08/01/2019 11:00 AM Medical Record Number: [id number] Patient Account Number: [id number] Date of Birth/Sex: January 17, 1946 (73 y.o. M) Treating RN: Montey Hora Primary Care Provider: Glendon Axe Other Clinician: Referring Provider: Glendon Axe Treating Provider/Extender: STONE III, Daeron Carreno Weeks in Treatment: 33 Information Obtained from: Patient Chief Complaint Left great toe ulcer Electronic Signature(s) Signed: 08/01/2019 11:23:00 AM By: Worthy Keeler PA-C Entered By: Worthy Keeler on 08/01/2019 11:23:00 Crucible, [name] ([id number]) -------------------------------------------------------------------------------- HPI Details Patient Name: [name] Date of Service: 08/01/2019 11:00 AM Medical Record Number: [id number] Patient Account Number: [id number] Date of Birth/Sex: 1946-03-30 (73 y.o. M) Treating RN: Montey Hora Primary Care Provider: Glendon Axe Other Clinician: Referring Provider: Glendon Axe Treating Provider/Extender: Melburn Hake, Dijuan Sleeth Weeks in Treatment: 33 History of Present Illness HPI Description: 12/12/18 on evaluation today patient presents as a referral from his endocrinologist regarding issues that he has been having with his left great toe. Subsequently he has been seeing Dr. Cleda Mccreedy and Dr. Cleda Mccreedy has been the breeding the wound and in fact this was debrided this morning. The patient had an appointment there prior to coming here. Subsequently he states that even though he's been going for regular debridement after office things really do not seem to be showing signs of improvement unfortunately. He states that he is having some discomfort but nothing too significant. No fevers, chills, nausea, or vomiting noted at this time. The patient does have a history of hypertension, neuropathy,  diabetes mellitus type II, and a history of stroke. Currently he's been using bacitracin on the toe and utilizing a Band-Aid. He tells me he's had this wound for two years. He cannot remember the last time he had an x-ray. 12/19/18 on evaluation today patient appears to be doing well in regard to his plantar toe ulcer. He's been tolerating the dressing changes without complication. Fortunately there does not appear to be signs of infection at this time which is good news. No fever chills noted. 01/19/19 has been one month since I last saw the patient as he was out of town. With that being said on evaluation today it does appear that he is going to require sharp debridement and I do believe that the total contact cast would benefit him as far as being applied at this time. He is in agreement that plan. 01/23/19 on evaluation today patient appears to be doing better in regard to his left great toe ulcer. He has been shown signs of improvement week by week and although this is slow it does seem to be doing fairly well which is good news. Overall very pleased in this regard. 01/31/19 on evaluation today patient presents for follow-up concerning his great toe ulcer. He was actually supposed to be here yesterday and he did come however he ended up having to leave due to his blood sugar dropping he states he just did not eat enough lunch. Fortunately seems to be doing much better today which is excellent news. Fortunately there's no evidence of infection at this time which is also good news. No fevers, chills, nausea, or vomiting noted at this time. Overall I feel like he is making excellent progress. 02/06/19 on evaluation today patient actually appears to be doing very well in regard to his plantar toe ulcer. He's been tolerating the dressing changes without complication. Fortunately there is no sign of infection at this time. Overall been  very pleased with how things seem to be progressing. No fevers,  chills, nausea, or vomiting noted at this time. 02/13/19 on evaluation today patient actually appears to be doing excellent in regard to his toe ulcer which is almost completely close. Overall very pleased with that. There does not appear to be any signs of infection which is good news. Upon close inspection it appears that he has just a very slight opening still noted at the central portion of the toe although this is minimal. 02/16/19 on evaluation today patient is seen for early follow-up due to the fact that he tells me while at home he actually got up to go answer the door without putting on the walking boot part of his cast and subsequently damaged the total contact cast. It therefore comes in to have this removed and potentially replaced. Fortunately other than it given him a little bit of pain its far as pushing in on some areas there doesn't appear to be any injury once the cast was removed. 03/13/19 on evaluation today patient unfortunately presents for follow-up concerning his great toe ulcer on the left. He was discharged on 02/16/19 with the wound healed at that point. He tells me this remain so for several weeks or least a couple weeks before reopening. Fortunately there does not appear to be any signs of infection at this time. Nonetheless for the past week at least he's had the wound reopened and states it is been trying to keep pressure off of this but he has not been using the offloading shoe we previously gave him. Fortunately there's no evidence of infection at this time. No fevers, chills, nausea, or vomiting noted at this time. VEDANTH, SIRICO ([id number]) 03/17/19 on evaluation today patient actually appears to be doing rather well in regard to his toe also. Fact this appears to be significantly smaller this week even compared to what I saw last week on evaluation. He seems to have done very well for the offloading shoe and overall I'm very pleased with the progress that is  made. It may be that he doesn't even need the cast to get this to heal if he offloads this appropriately. 03/24/19 on evaluation today patient actually appears to be doing well in regard to his plantar foot ulcer. He's been tolerating the dressing changes without complication the collagen does seem to be beneficial for him. With that being said he is gonna require some miles sharp debridement today to clear away some of the callous's around the edge of the wound as well as the minimal Slough noted on the surface of the wound. 03/31/19 on evaluation today patient appears to be doing well in regard to his plantar great toe ulcer. He has been tolerating the dressing changes without complication which is good news. Fortunately there does not appear to be any signs of active infection at this time. Overall very pleased with how things seem to be progressing. 04/11/19 on evaluation today patient's tools are appears to be doing in my pinion roughly about the same as were things that previous. Fortunately there does not appear to be signs of active infection at this time which is good news. With that being said he has not been experiencing any pain at this time which is good news there is also No fevers, chills, nausea, or vomiting noted at this time. 04/18/19 on evaluation today patient's wound actually appears to be doing fairly well today he did not even really have a lot of  callous buildup. We have been debating on whether or not to reinitiate total contact cast. With that being said he seems to be doing fairly well this point with offloading in my pinion and again I think we need to come up with a way to get this healed that will also be sustainable for keeping it close. Again we were able to close it with the cast previous but again he has to be able to keep it such. For that reason we are working on trying to get him diabetic shoes he's waiting for the [place] to get in touch with the local company that has  to measure him for these do not want to have a cast on at such a time as when they need to actually measure him. 04/25/19 on evaluation today patient appears to be doing well in regard to his plantar foot specifically great toe ulcer. He's been tolerating the dressing changes without complication. Fortunately there's no signs of active infection at this time. Overall I'm very pleased with how things appear currently. 05/02/19 on evaluation today patient appears to actually be doing better in regard to his plantar foot ulcer. He shown signs of good improvement which is excellent news. She states he can walk better with the cast on the can with the offloading shoe that he had on previous. Fortunately there's no signs of active infection at this time. No fevers, chills, nausea, or vomiting noted at this time. 05/09/19 on evaluation today patient actually appears to be doing excellent. In fact he appears to be completely healed based on evaluation at this time. Fortunately there's no signs of infection and is having no discomfort. 06/08/19 this is a follow-up visit although the patient has been healed for almost a month but not quite. He tells me that in the past several days he noted some blood coming from his toe and he thought he should come in at this checked out. Fortunately he's having a pain. He also did get his custom orthotics shoes. With that being said he still appears to have developed this ulceration there is something he tells me that the orthotic specialist stated they could do to the toes to try to help out in this regard although he did not know exactly what it was we may need to check with [facility]. 06/12/19 on evaluation today patient actually appears to be doing great in regard to his foot ulcer. The wound on his toes shown signs of excellent improvement which is great news. With that being said he is not completely close yet the cast has done wonders for him and he is definitely  headed in the correct direction. No fevers, chills, nausea, or vomiting noted at this time. 06/22/19 on evaluation today fortunately patient actually appears to be healing quite well and in fact appears to be completely healed this is excellent news. Fortunately there's no signs of active infection he's been tolerating the total contact cast without complication. Overall I'm pleased with the progress that has been made. He also has his new custom shoes he brought was with him today. 07/04/19 on evaluation today patient actually appears to be doing a little bit worse in regard his toe ulcer unfortunately this has yet again reopened. It has only been about a week and a half since I've last seen him I really did not expect this to reopen this quickly. Nonetheless I did discuss with him his normal routine in order to see what may be causing this.  He has custom shoes. With that being said he apparently has not been using the custom shoes and even when he has used them he has not BOYCE, KELTNER. ([id number]) even put the insert in there which is what was custom molded to him in order to appropriately offload high-pressure point areas. In fact his exact question to me was whether or not he needed to actually put those in they just came loose in the box and he had never installed them. Nonetheless it also came to light the he's been walking around in his bedroom slippers at home he tells me he does not go barefoot but he really has not been wearing his custom offloading/diabetic shoes. Nonetheless I do believe that this is why he continually reopens as far as the wound is concerned. I discussed this with the patient in great detail today for yet again although I previously had this discussion with him in the past. 07/11/19-Patient returns at 1 week for his left great toe wound which is actually doing really well, he has been using the insert to offload the toe and is very pleased with it. This looks  like this is working out really well for him 07/18/19 on evaluation today patient appears to be doing really about the same with regard to his ulcer on the right great toe. He's been tolerating the dressing changes without complication. Fortunately there's no signs of active infection at this time. With that being said I think that we're having a difficult time getting this to heal and I do believe that initiating treatment with the total contact cast to get this to close would be a good idea. The good news is he seems to be keeping this from worsening so hopefully that means that if we get it closed he can prevent this from reopening in the future. At this point my suggestion was that we go ahead and initiate treatment at both sites utilizing collagen. We will then subsequently put a piece of silver out and it between the toes of the right foot in the webbing between the fourth and fifth toe in order to help keep things dry and allow this to hopefully close and we heal without complication. The patient is in agreement with plan. Subsequently I am hopeful that both wounds will heal quite quickly we have been very close now with the ankle and again the toe has reopened but fortunately doesn't appear to be to bad at this point. No antibiotics were prescribed today as they did not appear to be necessary. 07/25/19 on evaluation today patient actually appears to be doing excellent in regard to his great toe ulcer. In fact this really appears to be almost completely healed if indeed it is not in fact healed. With that being said there is still the eighth small pinpoint area that could be open although I tend to doubt it. Nonetheless it is something that I would like to continue to watch for more week I do think it would be beneficial for [place] to go ahead and continue with the cast for one more week before deciding to discharge him especially in light of the issues he's had in the past. Specifically with regard  to the wound reopening rather quickly. 08/01/2019 upon evaluation today patient actually appears to be doing well with regard to his great toe ulcer. He has been tolerating the dressing changes without complication. Fortunately we think appears to be completely healed at this time which is great news.  I am very pleased with the progress up to this point. In fact he appears to be completely healed at this point. Electronic Signature(s) Signed: 08/01/2019 1:43:55 PM By: Worthy Keeler PA-C Entered By: Worthy Keeler on 08/01/2019 13:43:55 Ramson, [name] ([id number]) -------------------------------------------------------------------------------- Physical Exam Details Patient Name: [name] Date of Service: 08/01/2019 11:00 AM Medical Record Number: [id number] Patient Account Number: [id number] Date of Birth/Sex: 1946/07/30 (73 y.o. M) Treating RN: Montey Hora Primary Care Provider: Glendon Axe Other Clinician: Referring Provider: Glendon Axe Treating Provider/Extender: STONE III, Rhapsody Wolven Weeks in Treatment: 72 Constitutional Well-nourished and well-hydrated in no acute distress. Respiratory normal breathing without difficulty. Psychiatric this patient is able to make decisions and demonstrates good insight into disease process. Alert and Oriented x 3. pleasant and cooperative. Notes Patient's wound bed again showed signs of complete epithelialization there is no sign of opening whatsoever he did very well with a total contact cast and fortunately is completely healed. I am pleased as is the patient he is ready to get into his custom offloading shoes at this point. Electronic Signature(s) Signed: 08/01/2019 1:44:25 PM By: Worthy Keeler PA-C Entered By: Worthy Keeler on 08/01/2019 13:44:24 Runde, [name] ([id number]) -------------------------------------------------------------------------------- Physician Orders Details Patient Name: [name] Date of Service: 08/01/2019 11:00 AM Medical Record Number: [id number] Patient Account Number: [id number] Date of Birth/Sex: 1946/10/20 (73 y.o. M) Treating RN: Montey Hora Primary Care Provider: Glendon Axe Other Clinician: Referring Provider: Glendon Axe Treating Provider/Extender: Melburn Hake, Reuven Braver Weeks in Treatment: 30 Verbal / Phone Orders: No Diagnosis Coding ICD-10 Coding Code Description E11.621 Type 2 diabetes mellitus with foot ulcer L97.522 Non-pressure chronic ulcer of other part of left foot with fat layer exposed I10 Essential (primary) hypertension E11.43 Type 2 diabetes mellitus with diabetic autonomic (poly)neuropathy Discharge From [facility] Services o Discharge from [facility] Signature(s) Signed: 08/01/2019 4:46:21 PM By: Montey Hora Signed: 08/01/2019 9:00:41 PM By: Worthy Keeler PA-C Entered By: Montey Hora on 08/01/2019 11:24:46 [place], [name] ([id number]) -------------------------------------------------------------------------------- Problem List Details Patient Name: [name]. Date of Service: 08/01/2019 11:00 AM Medical Record Number: [id number] Patient Account Number: [id number] Date of Birth/Sex: [birth year] (73 y.o. M) Treating RN: Montey Hora Primary Care Provider: Glendon Axe Other Clinician: Referring Provider: Glendon Axe Treating Provider/Extender: Melburn Hake, Gilles Trimpe Weeks in Treatment: 33 Active Problems ICD-10 Evaluated Encounter Code Description Active Date Today Diagnosis E11.621 Type 2 diabetes mellitus with foot ulcer 12/12/2018 No Yes L97.522 Non-pressure chronic ulcer of other part of left foot with fat 12/12/2018 No Yes layer exposed I10 Essential (primary) hypertension 12/12/2018 No Yes E11.43 Type 2 diabetes mellitus with diabetic autonomic (poly) 12/12/2018 No Yes neuropathy Inactive Problems Resolved Problems Electronic Signature(s) Signed: 08/01/2019 11:22:53 AM  By: Worthy Keeler PA-C Entered By: Worthy Keeler on 08/01/2019 11:22:52 Kyte, [name] ([id number]) -------------------------------------------------------------------------------- Progress Note Details Patient Name: [name] Date of Service: 08/01/2019 11:00 AM Medical Record Number: [id number] Patient Account Number: [id number] Date of Birth/Sex: 1946/09/20 (73 y.o. M) Treating RN: Montey Hora Primary Care Provider: Glendon Axe Other Clinician: Referring Provider: Glendon Axe Treating Provider/Extender: Melburn Hake, Yomira Flitton Weeks in Treatment: 33 Subjective Chief Complaint Information obtained from Patient Left great toe ulcer History of Present Illness (HPI) 12/12/18 on evaluation today patient presents as a referral from his endocrinologist regarding issues that he has been having with his left great toe. Subsequently he has been seeing Dr. Cleda Mccreedy and Dr. Cleda Mccreedy has been the breeding the wound  and in fact this was debrided this morning. The patient had an appointment there prior to coming here. Subsequently he states that even though he's been going for regular debridement after office things really do not seem to be showing signs of improvement unfortunately. He states that he is having some discomfort but nothing too significant. No fevers, chills, nausea, or vomiting noted at this time. The patient does have a history of hypertension, neuropathy, diabetes mellitus type II, and a history of stroke. Currently he's been using bacitracin on the toe and utilizing a Band-Aid. He tells me he's had this wound for two years. He cannot remember the last time he had an x-ray. 12/19/18 on evaluation today patient appears to be doing well in regard to his plantar toe ulcer. He's been tolerating the dressing changes without complication. Fortunately there does not appear to be signs of infection at this time which is good news. No fever chills noted. 01/19/19 has been  one month since I last saw the patient as he was out of town. With that being said on evaluation today it does appear that he is going to require sharp debridement and I do believe that the total contact cast would benefit him as far as being applied at this time. He is in agreement that plan. 01/23/19 on evaluation today patient appears to be doing better in regard to his left great toe ulcer. He has been shown signs of improvement week by week and although this is slow it does seem to be doing fairly well which is good news. Overall very pleased in this regard. 01/31/19 on evaluation today patient presents for follow-up concerning his great toe ulcer. He was actually supposed to be here yesterday and he did come however he ended up having to leave due to his blood sugar dropping he states he just did not eat enough lunch. Fortunately seems to be doing much better today which is excellent news. Fortunately there's no evidence of infection at this time which is also good news. No fevers, chills, nausea, or vomiting noted at this time. Overall I feel like he is making excellent progress. 02/06/19 on evaluation today patient actually appears to be doing very well in regard to his plantar toe ulcer. He's been tolerating the dressing changes without complication. Fortunately there is no sign of infection at this time. Overall been very pleased with how things seem to be progressing. No fevers, chills, nausea, or vomiting noted at this time. 02/13/19 on evaluation today patient actually appears to be doing excellent in regard to his toe ulcer which is almost completely close. Overall very pleased with that. There does not appear to be any signs of infection which is good news. Upon close inspection it appears that he has just a very slight opening still noted at the central portion of the toe although this is minimal. 02/16/19 on evaluation today patient is seen for early follow-up due to the fact that he  tells me while at home he actually got up to go answer the door without putting on the walking boot part of his cast and subsequently damaged the total contact cast. It therefore comes in to have this removed and potentially replaced. Fortunately other than it given him a little bit of pain its far as pushing in on some areas there doesn't appear to be any injury once the cast was removed. IAIN, SAWCHUK ([id number]) 03/13/19 on evaluation today patient unfortunately presents for follow-up concerning his great toe ulcer  on the left. He was discharged on 02/16/19 with the wound healed at that point. He tells me this remain so for several weeks or least a couple weeks before reopening. Fortunately there does not appear to be any signs of infection at this time. Nonetheless for the past week at least he's had the wound reopened and states it is been trying to keep pressure off of this but he has not been using the offloading shoe we previously gave him. Fortunately there's no evidence of infection at this time. No fevers, chills, nausea, or vomiting noted at this time. 03/17/19 on evaluation today patient actually appears to be doing rather well in regard to his toe also. Fact this appears to be significantly smaller this week even compared to what I saw last week on evaluation. He seems to have done very well for the offloading shoe and overall I'm very pleased with the progress that is made. It may be that he doesn't even need the cast to get this to heal if he offloads this appropriately. 03/24/19 on evaluation today patient actually appears to be doing well in regard to his plantar foot ulcer. He's been tolerating the dressing changes without complication the collagen does seem to be beneficial for him. With that being said he is gonna require some miles sharp debridement today to clear away some of the callous's around the edge of the wound as well as the minimal Slough noted on the surface  of the wound. 03/31/19 on evaluation today patient appears to be doing well in regard to his plantar great toe ulcer. He has been tolerating the dressing changes without complication which is good news. Fortunately there does not appear to be any signs of active infection at this time. Overall very pleased with how things seem to be progressing. 04/11/19 on evaluation today patient's tools are appears to be doing in my pinion roughly about the same as were things that previous. Fortunately there does not appear to be signs of active infection at this time which is good news. With that being said he has not been experiencing any pain at this time which is good news there is also No fevers, chills, nausea, or vomiting noted at this time. 04/18/19 on evaluation today patient's wound actually appears to be doing fairly well today he did not even really have a lot of callous buildup. We have been debating on whether or not to reinitiate total contact cast. With that being said he seems to be doing fairly well this point with offloading in my pinion and again I think we need to come up with a way to get this healed that will also be sustainable for keeping it close. Again we were able to close it with the cast previous but again he has to be able to keep it such. For that reason we are working on trying to get him diabetic shoes he's waiting for the [place] to get in touch with the local company that has to measure him for these do not want to have a cast on at such a time as when they need to actually measure him. 04/25/19 on evaluation today patient appears to be doing well in regard to his plantar foot specifically great toe ulcer. He's been tolerating the dressing changes without complication. Fortunately there's no signs of active infection at this time. Overall I'm very pleased with how things appear currently. 05/02/19 on evaluation today patient appears to actually be doing better in regard to  his plantar  foot ulcer. He shown signs of good improvement which is excellent news. She states he can walk better with the cast on the can with the offloading shoe that he had on previous. Fortunately there's no signs of active infection at this time. No fevers, chills, nausea, or vomiting noted at this time. 05/09/19 on evaluation today patient actually appears to be doing excellent. In fact he appears to be completely healed based on evaluation at this time. Fortunately there's no signs of infection and is having no discomfort. 06/08/19 this is a follow-up visit although the patient has been healed for almost a month but not quite. He tells me that in the past several days he noted some blood coming from his toe and he thought he should come in at this checked out. Fortunately he's having a pain. He also did get his custom orthotics shoes. With that being said he still appears to have developed this ulceration there is something he tells me that the orthotic specialist stated they could do to the toes to try to help out in this regard although he did not know exactly what it was we may need to check with [facility]. 06/12/19 on evaluation today patient actually appears to be doing great in regard to his foot ulcer. The wound on his toes shown signs of excellent improvement which is great news. With that being said he is not completely close yet the cast has done wonders for him and he is definitely headed in the correct direction. No fevers, chills, nausea, or vomiting noted at this time. 06/22/19 on evaluation today fortunately patient actually appears to be healing quite well and in fact appears to be completely healed this is excellent news. Fortunately there's no signs of active infection he's been tolerating the total contact cast without complication. Overall I'm pleased with the progress that has been made. He also has his new custom shoes he brought was TRACEY, STEWART ([id number]) with him  today. 07/04/19 on evaluation today patient actually appears to be doing a little bit worse in regard his toe ulcer unfortunately this has yet again reopened. It has only been about a week and a half since I've last seen him I really did not expect this to reopen this quickly. Nonetheless I did discuss with him his normal routine in order to see what may be causing this. He has custom shoes. With that being said he apparently has not been using the custom shoes and even when he has used them he has not even put the insert in there which is what was custom molded to him in order to appropriately offload high-pressure point areas. In fact his exact question to me was whether or not he needed to actually put those in they just came loose in the box and he had never installed them. Nonetheless it also came to light the he's been walking around in his bedroom slippers at home he tells me he does not go barefoot but he really has not been wearing his custom offloading/diabetic shoes. Nonetheless I do believe that this is why he continually reopens as far as the wound is concerned. I discussed this with the patient in great detail today for yet again although I previously had this discussion with him in the past. 07/11/19-Patient returns at 1 week for his left great toe wound which is actually doing really well, he has been using the insert to offload the toe and is very pleased with  it. This looks like this is working out really well for him 07/18/19 on evaluation today patient appears to be doing really about the same with regard to his ulcer on the right great toe. He's been tolerating the dressing changes without complication. Fortunately there's no signs of active infection at this time. With that being said I think that we're having a difficult time getting this to heal and I do believe that initiating treatment with the total contact cast to get this to close would be a good idea. The good news is he  seems to be keeping this from worsening so hopefully that means that if we get it closed he can prevent this from reopening in the future. At this point my suggestion was that we go ahead and initiate treatment at both sites utilizing collagen. We will then subsequently put a piece of silver out and it between the toes of the right foot in the webbing between the fourth and fifth toe in order to help keep things dry and allow this to hopefully close and we heal without complication. The patient is in agreement with plan. Subsequently I am hopeful that both wounds will heal quite quickly we have been very close now with the ankle and again the toe has reopened but fortunately doesn't appear to be to bad at this point. No antibiotics were prescribed today as they did not appear to be necessary. 07/25/19 on evaluation today patient actually appears to be doing excellent in regard to his great toe ulcer. In fact this really appears to be almost completely healed if indeed it is not in fact healed. With that being said there is still the eighth small pinpoint area that could be open although I tend to doubt it. Nonetheless it is something that I would like to continue to watch for more week I do think it would be beneficial for [place] to go ahead and continue with the cast for one more week before deciding to discharge him especially in light of the issues he's had in the past. Specifically with regard to the wound reopening rather quickly. 08/01/2019 upon evaluation today patient actually appears to be doing well with regard to his great toe ulcer. He has been tolerating the dressing changes without complication. Fortunately we think appears to be completely healed at this time which is great news. I am very pleased with the progress up to this point. In fact he appears to be completely healed at this point. Patient History Information obtained from Patient. Family History Diabetes - Mother, Heart Disease -  Father, Stroke - Father, No family history of Cancer, Hereditary Spherocytosis, Hypertension, Kidney Disease, Lung Disease, Seizures, Thyroid Problems, Tuberculosis. Social History Never smoker, Marital Status - Widowed, Alcohol Use - Never, Drug Use - No History, Caffeine Use - Moderate. Medical History Eyes Denies history of Cataracts, Glaucoma, Optic Neuritis Ear/Nose/Mouth/Throat Denies history of Chronic sinus problems/congestion, Middle ear problems Hematologic/Lymphatic Denies history of Anemia, Hemophilia, Human Immunodeficiency Virus, Lymphedema Respiratory Denies history of Aspiration, Asthma, Chronic Obstructive Pulmonary Disease (COPD), Pneumothorax, Sleep Apnea, URIEL, DOWDING ([id number]) Tuberculosis Cardiovascular Patient has history of Hypertension Denies history of Angina, Arrhythmia, Congestive Heart Failure, Coronary Artery Disease, Deep Vein Thrombosis, Hypotension, Myocardial Infarction, Peripheral Arterial Disease, Peripheral Venous Disease, Phlebitis, Vasculitis Gastrointestinal Denies history of Cirrhosis , Colitis, Crohn s, Hepatitis A, Hepatitis B, Hepatitis C Endocrine Patient has history of Type II Diabetes Denies history of Type I Diabetes Genitourinary Denies history of End Stage Renal Disease  Immunological Denies history of Lupus Erythematosus, Raynaud s, Scleroderma Integumentary (Skin) Denies history of History of Burn, History of pressure wounds Musculoskeletal Denies history of Gout, Rheumatoid Arthritis, Osteoarthritis, Osteomyelitis Neurologic Denies history of Dementia, Neuropathy, Quadriplegia, Paraplegia, Seizure Disorder Oncologic Denies history of Received Chemotherapy, Received Radiation Review of Systems (ROS) Constitutional Symptoms (General Health) Denies complaints or symptoms of Fatigue, Fever, Chills, Marked Weight Change. Respiratory Denies complaints or symptoms of Chronic or frequent coughs, Shortness of  Breath. Cardiovascular Denies complaints or symptoms of Chest pain, LE edema. Psychiatric Denies complaints or symptoms of Anxiety, Claustrophobia. Objective Constitutional Well-nourished and well-hydrated in no acute distress. Vitals Time Taken: 10:55 AM, Height: 72 in, Weight: 250 lbs, BMI: 33.9, Temperature: 98.3 F, Pulse: 72 bpm, Respiratory Rate: 16 breaths/min, Blood Pressure: 148/82 mmHg. Respiratory normal breathing without difficulty. Psychiatric this patient is able to make decisions and demonstrates good insight into disease process. Alert and Oriented x 3. pleasant and cooperative. General Notes: Patient's wound bed again showed signs of complete epithelialization there is no sign of opening whatsoever Tigert, Mizael L. ([id number]) he did very well with a total contact cast and fortunately is completely healed. I am pleased as is the patient he is ready to get into his custom offloading shoes at this point. Integumentary (Hair, Skin) Wound #1R status is Healed - Epithelialized. Original cause of wound was Gradually Appeared. The wound is located on the Left Toe Great. The wound measures 0cm length x 0cm width x 0cm depth; 0cm^2 area and 0cm^3 volume. There is no tunneling or undermining noted. There is a none present amount of drainage noted. The wound margin is thickened. There is no granulation within the wound bed. There is no necrotic tissue within the wound bed. Assessment Active Problems ICD-10 Type 2 diabetes mellitus with foot ulcer Non-pressure chronic ulcer of other part of left foot with fat layer exposed Essential (primary) hypertension Type 2 diabetes mellitus with diabetic autonomic (poly)neuropathy Plan Discharge From [facility] Services: Discharge from [place] 1. I suggested that the patient continue to monitor her is toe for any signs of reopening and he should do this at least every night if not even in between. 2. With regard to his  offloading he should be wearing his custom offloading shoes anytime he is going about. With that being said I do suggest that he needs to however take it easy and not do too much initially he needs to slowly work back into getting more active not jump right back into it. 3. There really is no dressing that can be required over the previous wound site again I really think what he needs most is to continue to wear his offloading shoes. We will see him back for follow-up as needed in the future. Electronic Signature(s) Signed: 08/01/2019 1:45:41 PM By: Worthy Keeler PA-C Entered By: Worthy Keeler on 08/01/2019 13:45:41 Fowler, [name] ([id number]) -------------------------------------------------------------------------------- ROS/PFSH Details Patient Name: [name] Date of Service: 08/01/2019 11:00 AM Medical Record Number: [id number] Patient Account Number: [id number] Date of Birth/Sex: 06-16-1946 (73 y.o. M) Treating RN: Montey Hora Primary Care Provider: Glendon Axe Other Clinician: Referring Provider: Glendon Axe Treating Provider/Extender: STONE III, Fabion Gatson Weeks in Treatment: 33 Information Obtained From Patient Constitutional Symptoms (General Health) Complaints and Symptoms: Negative for: Fatigue; Fever; Chills; Marked Weight Change Respiratory Complaints and Symptoms: Negative for: Chronic or frequent coughs; Shortness of Breath Medical History: Negative for: Aspiration; Asthma; Chronic Obstructive Pulmonary Disease (COPD); Pneumothorax; Sleep Apnea; Tuberculosis Cardiovascular Complaints  and Symptoms: Negative for: Chest pain; LE edema Medical History: Positive for: Hypertension Negative for: Angina; Arrhythmia; Congestive Heart Failure; Coronary Artery Disease; Deep Vein Thrombosis; Hypotension; Myocardial Infarction; Peripheral Arterial Disease; Peripheral Venous Disease; Phlebitis; Vasculitis Psychiatric Complaints and Symptoms: Negative  for: Anxiety; Claustrophobia Eyes Medical History: Negative for: Cataracts; Glaucoma; Optic Neuritis Ear/Nose/Mouth/Throat Medical History: Negative for: Chronic sinus problems/congestion; Middle ear problems Hematologic/Lymphatic Medical History: Negative for: Anemia; Hemophilia; Human Immunodeficiency Virus; Lymphedema Gastrointestinal WESSLEY, EMERT ([id number]) Medical History: Negative for: Cirrhosis ; Colitis; Crohnos; Hepatitis A; Hepatitis B; Hepatitis C Endocrine Medical History: Positive for: Type II Diabetes Negative for: Type I Diabetes Time with diabetes: 1990 Treated with: Insulin Blood sugar tested every day: Yes Tested : Genitourinary Medical History: Negative for: End Stage Renal Disease Immunological Medical History: Negative for: Lupus Erythematosus; Raynaudos; Scleroderma Integumentary (Skin) Medical History: Negative for: History of Burn; History of pressure wounds Musculoskeletal Medical History: Negative for: Gout; Rheumatoid Arthritis; Osteoarthritis; Osteomyelitis Neurologic Medical History: Negative for: Dementia; Neuropathy; Quadriplegia; Paraplegia; Seizure Disorder Oncologic Medical History: Negative for: Received Chemotherapy; Received Radiation Immunizations Pneumococcal Vaccine: Received Pneumococcal Vaccination: Yes Implantable Devices No devices added Family and Social History Cancer: No; Diabetes: Yes - Mother; Heart Disease: Yes - Father; Hereditary Spherocytosis: No; Hypertension: No; Kidney Disease: No; Lung Disease: No; Seizures: No; Stroke: Yes - Father; Thyroid Problems: No; Tuberculosis: No; Never smoker; Marital Status - Widowed; Alcohol Use: Never; Drug Use: No History; Caffeine Use: Moderate; Financial Concerns: No; Food, Clothing or Shelter Needs: No; Support System Lacking: No; Transportation Concerns: No Physician Affirmation I have reviewed and agree with the above information. HANSON, MEDEIROS  ([id number]) Electronic Signature(s) Signed: 08/01/2019 4:46:21 PM By: Montey Hora Signed: 08/01/2019 9:00:41 PM By: Worthy Keeler PA-C Entered By: Worthy Keeler on 08/01/2019 13:44:12 [place], [name] ([id number]) -------------------------------------------------------------------------------- SuperBill Details Patient Name: [name] Date of Service: 08/01/2019 Medical Record Number: [id number] Patient Account Number: [id number] Date of Birth/Sex: Feb 12, 1946 (73 y.o. M) Treating RN: Montey Hora Primary Care Provider: Glendon Axe Other Clinician: Referring Provider: Glendon Axe Treating Provider/Extender: STONE III, Lara Palinkas Weeks in Treatment: 33 Diagnosis Coding ICD-10 Codes Code Description E11.621 Type 2 diabetes mellitus with foot ulcer L97.522 Non-pressure chronic ulcer of other part of left foot with fat layer exposed I10 Essential (primary) hypertension E11.43 Type 2 diabetes mellitus with diabetic autonomic (poly)neuropathy Facility Procedures CPT4 Code: [id number] Description: 99213 - WOUND CARE VISIT-LEV 3 EST PT Modifier: Quantity: 1 Physician Procedures CPT4 Code: [id number] Description: 71696 - WC PHYS LEVEL 3 - EST PT ICD-10 Diagnosis Description E11.621 Type 2 diabetes mellitus with foot ulcer L97.522 Non-pressure chronic ulcer of other part of left foot with fat I10 Essential (primary) hypertension E11.43 Type 2 diabetes  mellitus with diabetic autonomic (poly)neuropa Modifier: layer exposed thy Quantity: 1 Electronic Signature(s) Signed: 08/01/2019 1:48:20 PM By: Worthy Keeler PA-C Entered By: Worthy Keeler on 08/01/2019 13:48:20

## 2019-08-02 NOTE — Progress Notes (Signed)
ORVA, GWALTNEY ([id number]) Visit Report for 08/01/2019 Arrival Information Details Patient Name: [name], [name]. Date of Service: 08/01/2019 11:00 AM Medical Record Number: [id number] Patient Account Number: [id number] Date of Birth/Sex: 12/19/1946 (73 y.o. M) Treating RN: Montey Hora Primary Care Debbra Digiulio: Glendon Axe Other Clinician: Referring Azyria Osmon: Glendon Axe Treating Ismelda Weatherman/Extender: STONE III, HOYT Weeks in Treatment: 28 Visit Information History Since Last Visit Added or deleted any medications: No Patient Arrived: Ambulatory Any new allergies or adverse reactions: No Arrival Time: 10:52 Had a fall or experienced change in No Accompanied By: self activities of daily living that may affect Transfer Assistance: None risk of falls: Patient Identification Verified: Yes Signs or symptoms of abuse/neglect since last visito No Secondary Verification Process Completed: Yes Hospitalized since last visit: No Patient Has Alerts: Yes Implantable device outside of the clinic excluding No Patient Alerts: DMII cellular tissue based products placed in the center since last visit: Has Dressing in Place as Prescribed: Yes Has Footwear/Offloading in Place as Prescribed: Yes Left: Total Contact Cast Pain Present Now: No Electronic Signature(s) Signed: 08/01/2019 4:29:51 PM By: Lorine Bears RCP, RRT, CHT Entered By: Lorine Bears on 08/01/2019 10:54:04 [place], [name] ([id number]) -------------------------------------------------------------------------------- Clinic Level of Care Assessment Details Patient Name: [name] Date of Service: 08/01/2019 11:00 AM Medical Record Number: [id number] Patient Account Number: [id number] Date of Birth/Sex: 09/02/1946 (73 y.o. M) Treating RN: Montey Hora Primary Care Irie Fiorello: Glendon Axe Other Clinician: Referring Adean Milosevic: Glendon Axe Treating Eveleen Mcnear/Extender:  STONE III, HOYT Weeks in Treatment: 33 Clinic Level of Care Assessment Items TOOL 4 Quantity Score []  - Use when only an EandM is performed on FOLLOW-UP visit 0 ASSESSMENTS - Nursing Assessment / Reassessment X - Reassessment of Co-morbidities (includes updates in patient status) 1 10 X- 1 5 Reassessment of Adherence to Treatment Plan ASSESSMENTS - Wound and Skin Assessment / Reassessment X - Simple Wound Assessment / Reassessment - one wound 1 5 []  - 0 Complex Wound Assessment / Reassessment - multiple wounds []  - 0 Dermatologic / Skin Assessment (not related to wound area) ASSESSMENTS - Focused Assessment []  - Circumferential Edema Measurements - multi extremities 0 []  - 0 Nutritional Assessment / Counseling / Intervention X- 1 5 Lower Extremity Assessment (monofilament, tuning fork, pulses) []  - 0 Peripheral Arterial Disease Assessment (using hand held doppler) ASSESSMENTS - Ostomy and/or Continence Assessment and Care []  - Incontinence Assessment and Management 0 []  - 0 Ostomy Care Assessment and Management (repouching, etc.) PROCESS - Coordination of Care X - Simple Patient / Family Education for ongoing care 1 15 []  - 0 Complex (extensive) Patient / Family Education for ongoing care X- 1 10 Staff obtains Programmer, systems, Records, Test Results / Process Orders []  - 0 Staff telephones HHA, Nursing Homes / Clarify orders / etc []  - 0 Routine Transfer to another Facility (non-emergent condition) []  - 0 Routine Hospital Admission (non-emergent condition) []  - 0 New Admissions / Biomedical engineer / Ordering NPWT, Apligraf, etc. []  - 0 Emergency Hospital Admission (emergent condition) X- 1 10 Simple Discharge Coordination COSIMO, SCHERTZER ([id number]) []  - 0 Complex (extensive) Discharge Coordination PROCESS - Special Needs []  - Pediatric / Minor Patient Management 0 []  - 0 Isolation Patient Management []  - 0 Hearing / Language / Visual special needs []  -  0 Assessment of Community assistance (transportation, D/C planning, etc.) []  - 0 Additional assistance / Altered mentation []  - 0 Support Surface(s) Assessment (bed, cushion, seat, etc.) INTERVENTIONS - Wound Cleansing / Measurement X -  Simple Wound Cleansing - one wound 1 5 []  - 0 Complex Wound Cleansing - multiple wounds X- 1 5 Wound Imaging (photographs - any number of wounds) []  - 0 Wound Tracing (instead of photographs) X- 1 5 Simple Wound Measurement - one wound []  - 0 Complex Wound Measurement - multiple wounds INTERVENTIONS - Wound Dressings []  - Small Wound Dressing one or multiple wounds 0 []  - 0 Medium Wound Dressing one or multiple wounds []  - 0 Large Wound Dressing one or multiple wounds []  - 0 Application of Medications - topical []  - 0 Application of Medications - injection INTERVENTIONS - Miscellaneous []  - External ear exam 0 []  - 0 Specimen Collection (cultures, biopsies, blood, body fluids, etc.) []  - 0 Specimen(s) / Culture(s) sent or taken to Lab for analysis []  - 0 Patient Transfer (multiple staff / Civil Service fast streamer / Similar devices) []  - 0 Simple Staple / Suture removal (25 or less) []  - 0 Complex Staple / Suture removal (26 or more) []  - 0 Hypo / Hyperglycemic Management (close monitor of Blood Glucose) []  - 0 Ankle / Brachial Index (ABI) - do not check if billed separately X- 1 5 Vital Signs Corriveau, VALTON SCHWARTZ ([id number]) Has the patient been seen at the hospital within the last three years: Yes Total Score: 80 Level Of Care: New/Established - Level 3 Electronic Signature(s) Signed: 08/01/2019 4:46:21 PM By: Montey Hora Entered By: Montey Hora on 08/01/2019 11:39:25 [name] ([id number]) -------------------------------------------------------------------------------- Encounter Discharge Information Details Patient Name: [name]. Date of Service: 08/01/2019 11:00 AM Medical Record Number:  [id number] Patient Account Number: [id number] Date of Birth/Sex: 26-Dec-1946 (73 y.o. M) Treating RN: Montey Hora Primary Care Corrie Brannen: Glendon Axe Other Clinician: Referring Quentina Fronek: Glendon Axe Treating Cleo Villamizar/Extender: Melburn Hake, HOYT Weeks in Treatment: 31 Encounter Discharge Information Items Discharge Condition: Stable Ambulatory Status: Ambulatory Discharge Destination: Home Transportation: Private Auto Accompanied By: self Schedule Follow-up Appointment: No Clinical Summary of Care: Electronic Signature(s) Signed: 08/01/2019 11:40:18 AM By: Montey Hora Entered By: Montey Hora on 08/01/2019 11:40:17 [name] ([id number]) -------------------------------------------------------------------------------- Lower Extremity Assessment Details Patient Name: [name] Date of Service: 08/01/2019 11:00 AM Medical Record Number: [id number] Patient Account Number: [id number] Date of Birth/Sex: 1946-04-09 (73 y.o. M) Treating RN: Harold Barban Primary Care Bryley Chrisman: Glendon Axe Other Clinician: Referring Diezel Mazur: Glendon Axe Treating Jamier Urbas/Extender: STONE III, HOYT Weeks in Treatment: 33 Edema Assessment Assessed: [Left: No] [Right: No] [Left: Edema] [Right: :] Calf Left: Right: Point of Measurement: 35 cm From Medial Instep 37 cm cm Ankle Left: Right: Point of Measurement: 12 cm From Medial Instep 23 cm cm Vascular Assessment Pulses: Dorsalis Pedis Palpable: [Left:Yes] Posterior Tibial Palpable: [Left:Yes] Electronic Signature(s) Signed: 08/01/2019 3:07:27 PM By: Harold Barban Entered By: Harold Barban on 08/01/2019 11:16:16 [name] ([id number]) -------------------------------------------------------------------------------- [place] Details Patient Name: [name]. Date of Service: 08/01/2019 11:00 AM Medical Record Number: [id number] Patient Account Number:  [id number] Date of Birth/Sex: Jun 23, 1946 (73 y.o. M) Treating RN: Montey Hora Primary Care Ezrah Panning: Glendon Axe Other Clinician: Referring Layali Freund: Glendon Axe Treating Corrin Hingle/Extender: Melburn Hake, HOYT Weeks in Treatment: 33 Active Inactive Electronic Signature(s) Signed: 08/01/2019 4:46:21 PM By: Montey Hora Entered By: Montey Hora on 08/01/2019 11:24:25 [name] ([id number]) -------------------------------------------------------------------------------- Pain Assessment Details Patient Name: [name]. Date of Service: 08/01/2019 11:00 AM Medical Record Number: [id number] Patient Account Number: [id number] Date of Birth/Sex: Oct 22, 1946 (73 y.o. M) Treating RN: Montey Hora Primary Care Tamey Wanek: Glendon Axe Other Clinician: Referring  Kysa Calais: Glendon Axe Treating Dortha Neighbors/Extender: STONE III, HOYT Weeks in Treatment: 64 Active Problems Location of Pain Severity and Description of Pain Patient Has Paino No Site Locations Pain Management and Medication Current Pain Management: Electronic Signature(s) Signed: 08/01/2019 4:29:51 PM By: Paulla Fore, RRT, CHT Signed: 08/01/2019 4:46:21 PM By: Montey Hora Entered By: Lorine Bears on 08/01/2019 10:54:12 [name] ([id number]) -------------------------------------------------------------------------------- Patient/Caregiver Education Details Patient Name: [name]. Date of Service: 08/01/2019 11:00 AM Medical Record Number: [id number] Patient Account Number: [id number] Date of Birth/Gender: 1946-09-25 (73 y.o. M) Treating RN: Montey Hora Primary Care Physician: Glendon Axe Other Clinician: Referring Physician: Glendon Axe Treating Physician/Extender: Sharalyn Ink in Treatment: 40 Education Assessment Education Provided To: Patient Education Topics Provided Offloading: Handouts: Other: need for  continued offloading Methods: Explain/Verbal Responses: State content correctly Electronic Signature(s) Signed: 08/01/2019 4:46:21 PM By: Montey Hora Entered By: Montey Hora on 08/01/2019 11:39:59 [place], [name] ([id number]) -------------------------------------------------------------------------------- Wound Assessment Details Patient Name: [name]. Date of Service: 08/01/2019 11:00 AM Medical Record Number: [id number] Patient Account Number: [id number] Date of Birth/Sex: [birth year] (73 y.o. M) Treating RN: Montey Hora Primary Care Aunesti Pellegrino: Glendon Axe Other Clinician: Referring Lief Palmatier: Glendon Axe Treating Kerline Trahan/Extender: STONE III, HOYT Weeks in Treatment: 33 Wound Status Wound Number: [id number] Primary Etiology: Diabetic Wound/Ulcer of the Lower Extremity Wound Location: Left Toe Great Wound Status: Healed - Epithelialized Wounding Event: Gradually Appeared Comorbid Hypertension, Type II Diabetes Date Acquired: 11/30/2017 History: Weeks Of Treatment: 33 Clustered Wound: No Photos Wound Measurements Length: (cm) 0 % Reduc Width: (cm) 0 % Reduc Depth: (cm) 0 Epithel Area: (cm) 0 Tunnel Volume: (cm) 0 Underm tion in Area: 100% tion in Volume: 100% ialization: Large (67-100%) ing: No ining: No Wound Description Classification: Grade 2 Foul O Wound Margin: Thickened Slough Exudate Amount: None Present dor After Cleansing: No /Fibrino Yes Wound Bed Granulation Amount: None Present (0%) Exposed Structure Necrotic Amount: None Present (0%) Fascia Exposed: No Fat Layer (Subcutaneous Tissue) Exposed: No Tendon Exposed: No Muscle Exposed: No Joint Exposed: No Bone Exposed: No Electronic Signature(s) Signed: 08/01/2019 4:46:21 PM By: Montey Hora Entered By: Montey Hora on 08/01/2019 11:24:07 [name] ([id number]) [place], [name]  ([id number]) -------------------------------------------------------------------------------- [place] Details Patient Name: [name]. Date of Service: 08/01/2019 11:00 AM Medical Record Number: [id number] Patient Account Number: [id number] Date of Birth/Sex: 04/06/1946 (73 y.o. M) Treating RN: Montey Hora Primary Care Iseah Plouff: Glendon Axe Other Clinician: Referring Netra Postlethwait: Glendon Axe Treating Jalynne Persico/Extender: STONE III, HOYT Weeks in Treatment: 33 Vital Signs Time Taken: 10:55 Temperature (F): 98.3 Height (in): 72 Pulse (bpm): 72 Weight (lbs): 250 Respiratory Rate (breaths/min): 16 Body Mass Index (BMI): 33.9 Blood Pressure (mmHg): 148/82 Reference Range: 80 - 120 mg / dl Electronic Signature(s) Signed: 08/01/2019 4:29:51 PM By: Lorine Bears RCP, RRT, CHT Entered By: Lorine Bears on 08/01/2019 10:57:47

## 2019-08-25 NOTE — Progress Notes (Signed)
ISAHA, CONTORNO ([id number]) Visit Report for 03/31/2019 Arrival Information Details Patient Name: [name], [name]. Date of Service: 03/31/2019 8:15 AM Medical Record Number: [id number] Patient Account Number: [id number] Date of Birth/Sex: [birth year] (74 y.o. M) Treating RN: Montey Hora Primary Care Kanyla Omeara: Glendon Axe Other Clinician: Referring Niveah Boerner: Glendon Axe Treating Yahir Tavano/Extender: STONE III, HOYT Weeks in Treatment: 15 Visit Information History Since Last Visit Added or deleted any medications: No Patient Arrived: Ambulatory Any new allergies or adverse reactions: No Arrival Time: 08:15 Had a fall or experienced change in No Accompanied By: self activities of daily living that may affect Transfer Assistance: None risk of falls: Patient Identification Verified: Yes Signs or symptoms of abuse/neglect since last visito No Secondary Verification Process Completed: Yes Hospitalized since last visit: No Patient Has Alerts: Yes Implantable device outside of the clinic excluding No Patient Alerts: DMII cellular tissue based products placed in the center since last visit: Has Dressing in Place as Prescribed: Yes Pain Present Now: No Electronic Signature(s) Signed: 03/31/2019 12:19:30 PM By: Lorine Bears RCP, RRT, CHT Entered By: Lorine Bears on 03/31/2019 08:15:55 Gest, [name] ([id number]) -------------------------------------------------------------------------------- Complex / Palliative Patient Assessment Details Patient Name: [name] Date of Service: 03/31/2019 8:15 AM Medical Record Number: [id number] Patient Account Number: [id number] Date of Birth/Sex: 1946-11-27 (73 y.o. M) Treating RN: Montey Hora Primary Care Mathias Bogacki: Glendon Axe Other Clinician: Referring Frandy Basnett: Glendon Axe Treating Matricia Begnaud/Extender: STONE III, HOYT Weeks in Treatment: 15 Palliative Management  Criteria Complex Wound Management Criteria Patient has remarkable or complex co-morbidities requiring medications or treatments that extend wound healing times. Examples: o Diabetes mellitus with chronic renal failure or end stage renal disease requiring dialysis o Advanced or poorly controlled rheumatoid arthritis o Diabetes mellitus and end stage chronic obstructive pulmonary disease o Active cancer with current chemo- or radiation therapy DMII with neuropathy, HTN, hx CVA Care Approach Wound Care Plan: Complex Wound Management Electronic Signature(s) Signed: 03/31/2019 3:08:04 PM By: Montey Hora Signed: 03/31/2019 3:18:49 PM By: Worthy Keeler PA-C Entered By: Montey Hora on 03/31/2019 10:41:44 [place], [name] ([id number]) -------------------------------------------------------------------------------- Encounter Discharge Information Details Patient Name: [name]. Date of Service: 03/31/2019 8:15 AM Medical Record Number: [id number] Patient Account Number: [id number] Date of Birth/Sex: January 01, 1946 (73 y.o. M) Treating RN: Harold Barban Primary Care Datra Clary: Glendon Axe Other Clinician: Referring Layla Kesling: Glendon Axe Treating Camie Hauss/Extender: STONE III, HOYT Weeks in Treatment: 15 Encounter Discharge Information Items Post Procedure Vitals Discharge Condition: Stable Temperature (F): 98.5 Ambulatory Status: Ambulatory Pulse (bpm): 68 Discharge Destination: Home Respiratory Rate (breaths/min): 18 Transportation: Private Auto Blood Pressure (mmHg): 172/77 Accompanied By: self Schedule Follow-up Appointment: Yes Clinical Summary of Care: Electronic Signature(s) Signed: 08/25/2019 1:03:35 PM By: Harold Barban Entered By: Harold Barban on 03/31/2019 08:53:50 [name], [name] ([id number]) -------------------------------------------------------------------------------- Lower Extremity Assessment Details Patient Name: [name]. Date of Service: 03/31/2019 8:15 AM Medical Record Number: [id number] Patient Account Number: [id number] Date of Birth/Sex: 11-25-1946 (73 y.o. M) Treating RN: Harold Barban Primary Care Chirsty Armistead: Glendon Axe Other Clinician: Referring Pearlene Teat: Glendon Axe Treating Tawny Raspberry/Extender: STONE III, HOYT Weeks in Treatment: 15 Vascular Assessment Pulses: Dorsalis Pedis Palpable: [Left:Yes] Posterior Tibial Palpable: [Left:Yes] Extremity colors, hair growth, and conditions: Temperature of Extremity: [Left:Warm < 3 seconds] Toe Nail Assessment Left: Right: Thick: No Discolored: No Deformed: No Improper Length and Hygiene: No Electronic Signature(s) Signed: 08/25/2019 1:03:35 PM By: Harold Barban Entered By: Harold Barban on 03/31/2019 08:25:38 [name], [name] ([id number]) -------------------------------------------------------------------------------- Multi Wound Chart Details Patient  Name: [name], [name]. Date of Service: 03/31/2019 8:15 AM Medical Record Number: [id number] Patient Account Number: [id number] Date of Birth/Sex: 1946-12-18 (73 y.o. M) Treating RN: Montey Hora Primary Care Keiona Jenison: Glendon Axe Other Clinician: Referring Nathali Vent: Glendon Axe Treating Yaresly Menzel/Extender: STONE III, HOYT Weeks in Treatment: 15 Vital Signs Height(in): 72 Pulse(bpm): 68 Weight(lbs): 250 Blood Pressure(mmHg): 172/77 Body Mass Index(BMI): 34 Temperature(F): 98.5 Respiratory Rate 16 (breaths/min): Photos: [N/A:N/A] Wound Location: Left Toe Great N/A N/A Wounding Event: Gradually Appeared N/A N/A Primary Etiology: Diabetic Wound/Ulcer of the N/A N/A Lower Extremity Comorbid History: Hypertension, Type II Diabetes N/A N/A Date Acquired: 11/30/2017 N/A N/A Weeks of Treatment: 15 N/A N/A Wound Status: Open N/A N/A Wound Recurrence: Yes N/A N/A Measurements L x W x D 0.5x0.4x0.2 N/A N/A (cm) Area (cm) : 0.157 N/A N/A Volume (cm) :  0.031 N/A N/A % Reduction in Area: 75.30% N/A N/A % Reduction in Volume: 51.60% N/A N/A Classification: Grade 2 N/A N/A Exudate Amount: Small N/A N/A Exudate Type: Serosanguineous N/A N/A Exudate Color: red, brown N/A N/A Wound Margin: Thickened N/A N/A Granulation Amount: Large (67-100%) N/A N/A Granulation Quality: Pale N/A N/A Necrotic Amount: Small (1-33%) N/A N/A Exposed Structures: Fat Layer (Subcutaneous N/A N/A Tissue) Exposed: Yes Fascia: No Tendon: No Muscle: No [name], [name] ([id number]) Joint: No Bone: No Epithelialization: Large (67-100%) N/A N/A Wound Preparation: Ulcer Cleansing: N/A N/A Rinsed/Irrigated with Saline, Other: soap and water Topical Anesthetic Applied: Other: lidocaine 4% Treatment Notes Electronic Signature(s) Signed: 03/31/2019 3:08:04 PM By: Montey Hora Entered By: Montey Hora on 03/31/2019 08:42:48 [name] ([id number]) -------------------------------------------------------------------------------- Bruin Details Patient Name: [name] Date of Service: 03/31/2019 8:15 AM Medical Record Number: [id number] Patient Account Number: [id number] Date of Birth/Sex: July 24, 1946 (73 y.o. M) Treating RN: Montey Hora Primary Care Kayela Humphres: Glendon Axe Other Clinician: Referring Lecia Esperanza: Glendon Axe Treating Marquin Patino/Extender: STONE III, HOYT Weeks in Treatment: 15 Active Inactive Peripheral Neuropathy Nursing Diagnoses: Knowledge deficit related to disease process and management of peripheral neurovascular dysfunction Goals: Patient/caregiver will verbalize understanding of disease process and disease management Date Initiated: 03/17/2019 Target Resolution Date: 06/03/2019 Goal Status: Active Interventions: Assess signs and symptoms of neuropathy upon admission and as needed Provide education on Management of Neuropathy and Related Ulcers Notes: Wound/Skin Impairment Nursing  Diagnoses: Impaired tissue integrity Goals: Ulcer/skin breakdown will have a volume reduction of 30% by week 4 Date Initiated: 12/12/2018 Target Resolution Date: 02/25/2019 Goal Status: Active Interventions: Assess patient/caregiver ability to obtain necessary supplies Assess patient/caregiver ability to perform ulcer/skin care regimen upon admission and as needed Notes: Electronic Signature(s) Signed: 03/31/2019 3:08:04 PM By: Montey Hora Entered By: Montey Hora on 03/31/2019 08:42:21 [name], [name] ([id number]) -------------------------------------------------------------------------------- Pain Assessment Details Patient Name: [name]. Date of Service: 03/31/2019 8:15 AM Medical Record Number: [id number] Patient Account Number: [id number] Date of Birth/Sex: 12/30/1945 (73 y.o. M) Treating RN: Montey Hora Primary Care Faatima Tench: Glendon Axe Other Clinician: Referring Sigrid Schwebach: Glendon Axe Treating Eli Adami/Extender: STONE III, HOYT Weeks in Treatment: 15 Active Problems Location of Pain Severity and Description of Pain Patient Has Paino No Site Locations Pain Management and Medication Current Pain Management: Electronic Signature(s) Signed: 03/31/2019 12:19:30 PM By: Paulla Fore, RRT, CHT Signed: 03/31/2019 3:08:04 PM By: Montey Hora Entered By: Lorine Bears on 03/31/2019 08:16:02 [name] ([id number]) -------------------------------------------------------------------------------- Patient/Caregiver Education Details Patient Name: [name]. Date of Service: 03/31/2019 8:15 AM Medical Record Number: [id number] Patient Account Number: [id number] Date of Birth/Gender: June 30, 1946 (73  y.o. M) Treating RN: Montey Hora Primary Care Physician: Glendon Axe Other Clinician: Referring Physician: Glendon Axe Treating Physician/Extender: Sharalyn Ink in Treatment:  15 Education Assessment Education Provided To: Patient Education Topics Provided Wound/Skin Impairment: Handouts: Other: wound care as ordered Methods: Demonstration, Explain/Verbal Responses: State content correctly Electronic Signature(s) Signed: 03/31/2019 3:08:04 PM By: Montey Hora Entered By: Montey Hora on 03/31/2019 08:47:51 [name], [name] ([id number]) -------------------------------------------------------------------------------- Wound Assessment Details Patient Name: [name]. Date of Service: 03/31/2019 8:15 AM Medical Record Number: [id number] Patient Account Number: [id number] Date of Birth/Sex: 01/13/1946 (73 y.o. M) Treating RN: Harold Barban Primary Care Leyda Vanderwerf: Glendon Axe Other Clinician: Referring Pratyush Ammon: Glendon Axe Treating Ferrell Flam/Extender: STONE III, HOYT Weeks in Treatment: 15 Wound Status Wound Number: [id number] Primary Etiology: Diabetic Wound/Ulcer of the Lower Extremity Wound Location: Left Toe Great Wound Status: Open Wounding Event: Gradually Appeared Comorbid Hypertension, Type II Diabetes Date Acquired: 11/30/2017 History: Weeks Of Treatment: 15 Clustered Wound: No Photos Wound Measurements Length: (cm) 0.5 % Reduction in Width: (cm) 0.4 % Reduction in Depth: (cm) 0.2 Epithelializati Area: (cm) 0.157 Tunneling: Volume: (cm) 0.031 Undermining: Area: 75.3% Volume: 51.6% on: Large (67-100%) No No Wound Description Classification: Grade 2 Foul Odor After Wound Margin: Thickened Slough/Fibrino Exudate Amount: Small Exudate Type: Serosanguineous Exudate Color: red, brown Cleansing: No Yes Wound Bed Granulation Amount: Large (67-100%) Exposed Structure Granulation Quality: Pale Fascia Exposed: No Necrotic Amount: Small (1-33%) Fat Layer (Subcutaneous Tissue) Exposed: Yes Necrotic Quality: Adherent Slough Tendon Exposed: No Muscle Exposed: No Joint Exposed: No Bone Exposed: No Periwound Skin  Texture Texture Color [name], Fred L. ([id number]) No Abnormalities Noted: No No Abnormalities Noted: No Callus: Yes Atrophie Blanche: No Crepitus: No Cyanosis: No Excoriation: No Ecchymosis: Yes Induration: No Erythema: No Rash: No Hemosiderin Staining: No Scarring: No Mottled: No Pallor: No Moisture Rubor: No No Abnormalities Noted: No Dry / Scaly: No Temperature / Pain Maceration: Yes Temperature: No Abnormality Tenderness on Palpation: Yes Wound Preparation Ulcer Cleansing: Rinsed/Irrigated with Saline, Other: soap and water, Topical Anesthetic Applied: Other: lidocaine 4%, Electronic Signature(s) Signed: 08/25/2019 1:03:35 PM By: Harold Barban Entered By: Harold Barban on 03/31/2019 08:25:11 [name] ([id number]) -------------------------------------------------------------------------------- Vitals Details Patient Name: [name] Date of Service: 03/31/2019 8:15 AM Medical Record Number: [id number] Patient Account Number: [id number] Date of Birth/Sex: 30-Aug-1946 (73 y.o. M) Treating RN: Montey Hora Primary Care Shardea Cwynar: Glendon Axe Other Clinician: Referring Ashaunte Standley: Glendon Axe Treating Melva Faux/Extender: STONE III, HOYT Weeks in Treatment: 15 Vital Signs Time Taken: 08:16 Temperature (F): 98.5 Height (in): 72 Pulse (bpm): 68 Weight (lbs): 250 Respiratory Rate (breaths/min): 16 Body Mass Index (BMI): 33.9 Blood Pressure (mmHg): 172/77 Reference Range: 80 - 120 mg / dl Electronic Signature(s) Signed: 03/31/2019 12:19:30 PM By: Lorine Bears RCP, RRT, CHT Entered By: Becky Sax, Amado Nash on 03/31/2019 08:18:44

## 2019-09-13 DIAGNOSIS — G479 Sleep disorder, unspecified: Secondary | ICD-10-CM | POA: Insufficient documentation

## 2019-09-15 ENCOUNTER — Other Ambulatory Visit: Payer: Self-pay

## 2019-09-15 ENCOUNTER — Encounter: Payer: Medicare Other | Attending: Physician Assistant | Admitting: Physician Assistant

## 2019-09-15 DIAGNOSIS — L97522 Non-pressure chronic ulcer of other part of left foot with fat layer exposed: Secondary | ICD-10-CM | POA: Insufficient documentation

## 2019-09-15 DIAGNOSIS — E11621 Type 2 diabetes mellitus with foot ulcer: Secondary | ICD-10-CM | POA: Diagnosis not present

## 2019-09-15 DIAGNOSIS — Z8673 Personal history of transient ischemic attack (TIA), and cerebral infarction without residual deficits: Secondary | ICD-10-CM | POA: Insufficient documentation

## 2019-09-15 DIAGNOSIS — I10 Essential (primary) hypertension: Secondary | ICD-10-CM | POA: Insufficient documentation

## 2019-09-15 DIAGNOSIS — Z8249 Family history of ischemic heart disease and other diseases of the circulatory system: Secondary | ICD-10-CM | POA: Insufficient documentation

## 2019-09-15 DIAGNOSIS — E1142 Type 2 diabetes mellitus with diabetic polyneuropathy: Secondary | ICD-10-CM | POA: Diagnosis not present

## 2019-09-15 NOTE — Progress Notes (Signed)
[name] ([id number]) Visit Report for 09/15/2019 Allergy List Details Patient Name: [name], [name]. Date of Service: 09/15/2019 9:45 AM Medical Record Number: [id number] Patient Account Number: [id number] Date of Birth/Sex: 01-30-1946 (73 y.o. M) Treating RN: Harold Barban Primary Care Terrianna Holsclaw: Glendon Axe Other Clinician: Referring Mykah Bellomo: Glendon Axe Treating Hadlyn Amero/Extender: STONE III, HOYT Weeks in Treatment: 0 Allergies Active Allergies No Known Allergies Allergy Notes Electronic Signature(s) Signed: 09/15/2019 3:42:00 PM By: Harold Barban Entered By: Harold Barban on 09/15/2019 09:45:59 [name], [name] ([id number]) -------------------------------------------------------------------------------- Arrival Information Details Patient Name: [name]. Date of Service: 09/15/2019 9:45 AM Medical Record Number: [id number] Patient Account Number: [id number] Date of Birth/Sex: Mar 26, 1946 (73 y.o. M) Treating RN: Harold Barban Primary Care Laurin Paulo: Glendon Axe Other Clinician: Referring Aarushi Hemric: Glendon Axe Treating Chigozie Basaldua/Extender: STONE III, HOYT Weeks in Treatment: 0 Visit Information Patient Arrived: Ambulatory Arrival Time: 09:36 Accompanied By: self Transfer Assistance: None Patient Identification Verified: Yes Secondary Verification Process Completed: Yes History Since Last Visit Added or deleted any medications: No Any new allergies or adverse reactions: No Had a fall or experienced change in activities of daily living that may affect risk of falls: No Signs or symptoms of abuse/neglect since last visito No Hospitalized since last visit: No Has Dressing in Place as Prescribed: Yes Electronic Signature(s) Signed: 09/15/2019 3:42:00 PM By: Harold Barban Entered By: Harold Barban on 09/15/2019 09:37:11 [name], [name]  ([id number]) -------------------------------------------------------------------------------- Clinic Level of Care Assessment Details Patient Name: [name] Date of Service: 09/15/2019 9:45 AM Medical Record Number: [id number] Patient Account Number: [id number] Date of Birth/Sex: Apr 12, 1946 (73 y.o. M) Treating RN: Montey Hora Primary Care Mayfield Schoene: Glendon Axe Other Clinician: Referring Denaya Horn: Glendon Axe Treating Eliza Green/Extender: STONE III, HOYT Weeks in Treatment: 0 Clinic Level of Care Assessment Items TOOL 1 Quantity Score []  - Use when EandM and Procedure is performed on INITIAL visit 0 ASSESSMENTS - Nursing Assessment / Reassessment X - General Physical Exam (combine w/ comprehensive assessment (listed just below) when 1 20 performed on new pt. evals) X- 1 25 Comprehensive Assessment (HX, ROS, Risk Assessments, Wounds Hx, etc.) ASSESSMENTS - Wound and Skin Assessment / Reassessment []  - Dermatologic / Skin Assessment (not related to wound area) 0 ASSESSMENTS - Ostomy and/or Continence Assessment and Care []  - Incontinence Assessment and Management 0 []  - 0 Ostomy Care Assessment and Management (repouching, etc.) PROCESS - Coordination of Care X - Simple Patient / Family Education for ongoing care 1 15 []  - 0 Complex (extensive) Patient / Family Education for ongoing care X- 1 10 Staff obtains Programmer, systems, Records, Test Results / Process Orders []  - 0 Staff telephones HHA, Nursing Homes / Clarify orders / etc []  - 0 Routine Transfer to another Facility (non-emergent condition) []  - 0 Routine Hospital Admission (non-emergent condition) X- 1 15 New Admissions / Biomedical engineer / Ordering NPWT, Apligraf, etc. []  - 0 Emergency Hospital Admission (emergent condition) PROCESS - Special Needs []  - Pediatric / Minor Patient Management 0 []  - 0 Isolation Patient Management []  - 0 Hearing / Language / Visual special needs []  - 0 Assessment  of Community assistance (transportation, D/C planning, etc.) []  - 0 Additional assistance / Altered mentation []  - 0 Support Surface(s) Assessment (bed, cushion, seat, etc.) [name], [name] ([id number]) INTERVENTIONS - Miscellaneous []  - External ear exam 0 []  - 0 Patient Transfer (multiple staff / Civil Service fast streamer / Similar devices) []  - 0 Simple Staple / Suture removal (25 or less) []  - 0 Complex Staple /  Suture removal (26 or more) []  - 0 Hypo/Hyperglycemic Management (do not check if billed separately) []  - 0 Ankle / Brachial Index (ABI) - do not check if billed separately Has the patient been seen at the hospital within the last three years: Yes Total Score: 85 Level Of Care: New/Established - Level 3 Electronic Signature(s) Signed: 09/15/2019 4:27:31 PM By: Montey Hora Entered By: Montey Hora on 09/15/2019 10:15:44 [name], [name] ([id number]) -------------------------------------------------------------------------------- Encounter Discharge Information Details Patient Name: [name]. Date of Service: 09/15/2019 9:45 AM Medical Record Number: [id number] Patient Account Number: [id number] Date of Birth/Sex: 11/08/1946 (73 y.o. M) Treating RN: Montey Hora Primary Care Dearra Myhand: Glendon Axe Other Clinician: Referring Jereline Ticer: Glendon Axe Treating [name] Kassa/Extender: STONE III, HOYT Weeks in Treatment: 0 Encounter Discharge Information Items Post Procedure Vitals Discharge Condition: Stable Temperature (F): 98.2 Ambulatory Status: Cane Pulse (bpm): 73 Discharge Destination: Home Respiratory Rate (breaths/min): 16 Transportation: Private Auto Blood Pressure (mmHg): 173/73 Accompanied By: self Schedule Follow-up Appointment: Yes Clinical Summary of Care: Electronic Signature(s) Signed: 09/15/2019 4:27:31 PM By: Montey Hora Entered By: Montey Hora on 09/15/2019 10:22:53 [name]  ([id number]) -------------------------------------------------------------------------------- Lower Extremity Assessment Details Patient Name: [name]. Date of Service: 09/15/2019 9:45 AM Medical Record Number: [id number] Patient Account Number: [id number] Date of Birth/Sex: 1946/04/03 (73 y.o. M) Treating RN: Harold Barban Primary Care Gearl Kimbrough: Glendon Axe Other Clinician: Referring Sheily Lineman: Glendon Axe Treating Hong Moring/Extender: STONE III, HOYT Weeks in Treatment: 0 Edema Assessment Assessed: [Left: No] [Right: No] [Left: Edema] [Right: :] Calf Left: Right: Point of Measurement: 34 cm From Medial Instep 38 cm cm Ankle Left: Right: Point of Measurement: 10 cm From Medial Instep 25.5 cm cm Vascular Assessment Pulses: Dorsalis Pedis Palpable: [Left:Yes] Posterior Tibial Palpable: [Left:Yes] Electronic Signature(s) Signed: 09/15/2019 3:42:00 PM By: Harold Barban Entered By: Harold Barban on 09/15/2019 09:48:23 [place], [name] ([id number]) -------------------------------------------------------------------------------- Multi Wound Chart Details Patient Name: [name]. Date of Service: 09/15/2019 9:45 AM Medical Record Number: [id number] Patient Account Number: [id number] Date of Birth/Sex: 1946-06-07 (73 y.o. M) Treating RN: Montey Hora Primary Care Nicholai Willette: Glendon Axe Other Clinician: Referring Ethelyn Cerniglia: Glendon Axe Treating Carolene Gitto/Extender: STONE III, HOYT Weeks in Treatment: 0 Vital Signs Height(in): 72 Pulse(bpm): 73 Weight(lbs): 256 Blood Pressure(mmHg): 173/73 Body Mass Index(BMI): 35 Temperature(F): 98.2 Respiratory Rate 16 (breaths/min): Photos: [N/A:N/A] Wound Location: Left Toe Great - Plantar N/A N/A Wounding Event: Gradually Appeared N/A N/A Primary Etiology: Diabetic Wound/Ulcer of the N/A N/A Lower Extremity Comorbid History: Hypertension, Type II Diabetes N/A N/A Date Acquired: 09/07/2019  N/A N/A Weeks of Treatment: 0 N/A N/A Wound Status: Open N/A N/A Measurements L x W x D 0.5x0.6x0.3 N/A N/A (cm) Area (cm) : 0.236 N/A N/A Volume (cm) : 0.071 N/A N/A % Reduction in Area: 0.00% N/A N/A % Reduction in Volume: 0.00% N/A N/A Starting Position 1 10 (o'clock): Ending Position 1 12 (o'clock): Maximum Distance 1 (cm): 0.5 Undermining: Yes N/A N/A Classification: Grade 2 N/A N/A Exudate Amount: Medium N/A N/A Exudate Type: Serous N/A N/A Exudate Color: amber N/A N/A Wound Margin: Flat and Intact N/A N/A Granulation Amount: Small (1-33%) N/A N/A Granulation Quality: Pale N/A N/A Necrotic Amount: Large (67-100%) N/A N/A Exposed Structures: N/A N/A [name], [name] ([id number]) Fat Layer (Subcutaneous Tissue) Exposed: Yes Fascia: No Tendon: No Muscle: No Joint: No Bone: No Treatment Notes Electronic Signature(s) Signed: 09/15/2019 4:27:31 PM By: Montey Hora Entered By: Montey Hora on 09/15/2019 10:15:10 [name] ([id number]) -------------------------------------------------------------------------------- Multi-Disciplinary Care Plan Details Patient Name:  [name], Fred L. Date of Service: 09/15/2019 9:45 AM Medical Record Number: [id number] Patient Account Number: [id number] Date of Birth/Sex: 21-Jul-1946 (73 y.o. M) Treating RN: Montey Hora Primary Care Jamice Carreno: Glendon Axe Other Clinician: Referring Sharita Bienaime: Glendon Axe Treating Josejulian Tarango/Extender: STONE III, HOYT Weeks in Treatment: 0 Active Inactive Abuse / Safety / Falls / Self Care Management Nursing Diagnoses: Potential for falls Goals: Patient will not experience any injury related to falls Date Initiated: 09/15/2019 Target Resolution Date: 12/02/2019 Goal Status: Active Interventions: Assess fall risk on admission and as needed Notes: Peripheral Neuropathy Nursing Diagnoses: Potential alteration in peripheral tissue perfusion (select prior to  confirmation of diagnosis) Goals: Patient/caregiver will verbalize understanding of disease process and disease management Date Initiated: 09/15/2019 Target Resolution Date: 12/02/2019 Goal Status: Active Interventions: Assess signs and symptoms of neuropathy upon admission and as needed Notes: Wound/Skin Impairment Nursing Diagnoses: Impaired tissue integrity Goals: Ulcer/skin breakdown will heal within 14 weeks Date Initiated: 09/15/2019 Target Resolution Date: 12/02/2019 Goal Status: Active Interventions: Assess patient/caregiver ability to obtain necessary supplies [name], [name] ([id number]) Assess patient/caregiver ability to perform ulcer/skin care regimen upon admission and as needed Assess ulceration(s) every visit Notes: Electronic Signature(s) Signed: 09/15/2019 4:27:31 PM By: Montey Hora Entered By: Montey Hora on 09/15/2019 10:14:28 [name] ([id number]) -------------------------------------------------------------------------------- Pain Assessment Details Patient Name: [name]. Date of Service: 09/15/2019 9:45 AM Medical Record Number: [id number] Patient Account Number: [id number] Date of Birth/Sex: [birth year] (73 y.o. M) Treating RN: Harold Barban Primary Care Shanna Un: Glendon Axe Other Clinician: Referring Lindberg Zenon: Glendon Axe Treating Lilla Callejo/Extender: STONE III, HOYT Weeks in Treatment: 0 Active Problems Location of Pain Severity and Description of Pain Patient Has Paino No Site Locations Pain Management and Medication Current Pain Management: Electronic Signature(s) Signed: 09/15/2019 3:42:00 PM By: Harold Barban Entered By: Harold Barban on 09/15/2019 09:37:19 [name], [name] ([id number]) -------------------------------------------------------------------------------- Patient/Caregiver Education Details Patient Name: [name]. Date of Service: 09/15/2019 9:45 AM Medical Record  Number: [id number] Patient Account Number: [id number] Date of Birth/Gender: 12/24/1946 (73 y.o. M) Treating RN: Montey Hora Primary Care Physician: Glendon Axe Other Clinician: Referring Physician: Glendon Axe Treating Physician/Extender: Melburn Hake, HOYT Weeks in Treatment: 0 Education Assessment Education Provided To: Patient Education Topics Provided Offloading: Handouts: Other: need for ongoing offloading Methods: Explain/Verbal Responses: State content correctly Electronic Signature(s) Signed: 09/15/2019 4:27:31 PM By: Montey Hora Entered By: Montey Hora on 09/15/2019 10:16:18 [name] ([id number]) -------------------------------------------------------------------------------- Wound Assessment Details Patient Name: [name]. Date of Service: 09/15/2019 9:45 AM Medical Record Number: [id number] Patient Account Number: [id number] Date of Birth/Sex: 1946/10/03 (73 y.o. M) Treating RN: Harold Barban Primary Care Biannca Scantlin: Glendon Axe Other Clinician: Referring Adron Geisel: Glendon Axe Treating Barbi Kumagai/Extender: STONE III, HOYT Weeks in Treatment: 0 Wound Status Wound Number: 1 Primary Etiology: Diabetic Wound/Ulcer of the Lower Extremity Wound Location: Left Toe Great - Plantar Wound Status: Open Wounding Event: Gradually Appeared Comorbid Hypertension, Type II Diabetes Date Acquired: 09/07/2019 History: Weeks Of Treatment: 0 Clustered Wound: No Photos Wound Measurements Length: (cm) 0.5 % Reduction i Width: (cm) 0.6 % Reduction i Depth: (cm) 0.3 Tunneling: Area: (cm) 0.236 Undermining: Volume: (cm) 0.071 Starting Ending Pos Maximum Di n Area: 0% n Volume: 0% No Yes Position (o'clock): 10 ition (o'clock): 12 stance: (cm) 0.5 Wound Description Classification: Grade 2 Foul Odor Aft Wound Margin: Flat and Intact Slough/Fibrin Exudate Amount: Medium Exudate Type: Serous Exudate Color: amber er Cleansing: No o  Yes Wound Bed Granulation Amount: Small (1-33%) Exposed Structure Granulation Quality: Pale Fascia  Exposed: No Necrotic Amount: Large (67-100%) Fat Layer (Subcutaneous Tissue) Exposed: Yes Necrotic Quality: Adherent Slough Tendon Exposed: No Muscle Exposed: No Joint Exposed: No Bone Exposed: No [name], [name] ([id number]) Treatment Notes Wound #1 (Left, Plantar Toe Great) Notes silvercel, foam and TCC on left Electronic Signature(s) Signed: 09/15/2019 3:42:00 PM By: Harold Barban Entered By: Harold Barban on 09/15/2019 09:47:11 Sneads, [name] ([id number]) -------------------------------------------------------------------------------- Vitals Details Patient Name: [name]. Date of Service: 09/15/2019 9:45 AM Medical Record Number: [id number] Patient Account Number: [id number] Date of Birth/Sex: 1946-08-24 (73 y.o. M) Treating RN: Harold Barban Primary Care Markasia Carrol: Glendon Axe Other Clinician: Referring Kami Kube: Glendon Axe Treating Allannah Kempen/Extender: STONE III, HOYT Weeks in Treatment: 0 Vital Signs Time Taken: 09:35 Temperature (F): 98.2 Height (in): 72 Pulse (bpm): 73 Source: Stated Respiratory Rate (breaths/min): 16 Weight (lbs): 256 Blood Pressure (mmHg): 173/73 Source: Stated Reference Range: 80 - 120 mg / dl Body Mass Index (BMI): 34.7 Electronic Signature(s) Signed: 09/15/2019 3:42:00 PM By: Harold Barban Entered By: Harold Barban on 09/15/2019 09:38:02

## 2019-09-15 NOTE — Progress Notes (Signed)
FERRAN, GALEANO ([id number]) Visit Report for 09/15/2019 Chief Complaint Document Details Patient Name: [name], [name]. Date of Service: 09/15/2019 9:45 AM Medical Record Number: [id number] Patient Account Number: [id number] Date of Birth/Sex: [birth year] (73 y.o. M) Treating RN: Montey Hora Primary Care Provider: Glendon Axe Other Clinician: Referring Provider: Glendon Axe Treating Provider/Extender: STONE III, Thomasenia Dowse Weeks in Treatment: 0 Information Obtained from: Patient Chief Complaint Left great toe ulcer Electronic Signature(s) Signed: 09/15/2019 10:22:10 AM By: Worthy Keeler PA-C Entered By: Worthy Keeler on 09/15/2019 10:22:09 [name] ([id number]) -------------------------------------------------------------------------------- Debridement Details Patient Name: [name]. Date of Service: 09/15/2019 9:45 AM Medical Record Number: [id number] Patient Account Number: [id number] Date of Birth/Sex: Nov 17, 1946 (73 y.o. M) Treating RN: Montey Hora Primary Care Provider: Glendon Axe Other Clinician: Referring Provider: Glendon Axe Treating Provider/Extender: STONE III, Chamika Cunanan Weeks in Treatment: 0 Debridement Performed for Wound #1 Left,Plantar Toe Great Assessment: Performed By: Physician STONE III, Francess Mullen E., PA-C Debridement Type: Debridement Severity of Tissue Pre Fat layer exposed Debridement: Level of Consciousness (Pre- Awake and Alert procedure): Pre-procedure Verification/Time Yes - 10:15 Out Taken: Start Time: 10:15 Pain Control: Lidocaine 4% Topical Solution Total Area Debrided (L x W): 0.5 (cm) x 0.6 (cm) = 0.3 (cm) Tissue and other material Viable, Non-Viable, Callus, Slough, Subcutaneous, Slough debrided: Level: Skin/Subcutaneous Tissue Debridement Description: Excisional Instrument: Curette Bleeding: Minimum Hemostasis Achieved: Pressure End Time: 10:18 Procedural Pain: 0 Post Procedural Pain:  0 Response to Treatment: Procedure was tolerated well Level of Consciousness Awake and Alert (Post-procedure): Post Debridement Measurements of Total Wound Length: (cm) 0.6 Width: (cm) 0.6 Depth: (cm) 0.5 Volume: (cm) 0.141 Character of Wound/Ulcer Post Debridement: Improved Severity of Tissue Post Debridement: Fat layer exposed Post Procedure Diagnosis Same as Pre-procedure Electronic Signature(s) Signed: 09/15/2019 4:27:31 PM By: Montey Hora Signed: 09/15/2019 4:44:05 PM By: Worthy Keeler PA-C Entered By: Montey Hora on 09/15/2019 10:19:42 [place] ([id number]) -------------------------------------------------------------------------------- HPI Details Patient Name: [name] Date of Service: 09/15/2019 9:45 AM Medical Record Number: [id number] Patient Account Number: [id number] Date of Birth/Sex: [birth year] (73 y.o. M) Treating RN: Montey Hora Primary Care Provider: Glendon Axe Other Clinician: Referring Provider: Glendon Axe Treating Provider/Extender: Melburn Hake, Missie Gehrig Weeks in Treatment: 0 History of Present Illness HPI Description: 12/12/18 on evaluation today patient presents as a referral from his endocrinologist regarding issues that he has been having with his left great toe. Subsequently he has been seeing Dr. Cleda Mccreedy and Dr. Cleda Mccreedy has been the breeding the wound and in fact this was debrided this morning. The patient had an appointment there prior to coming here. Subsequently he states that even though he's been going for regular debridement after office things really do not seem to be showing signs of improvement unfortunately. He states that he is having some discomfort but nothing too significant. No fevers, chills, nausea, or vomiting noted at this time. The patient does have a history of hypertension, neuropathy, diabetes mellitus type II, and a history of stroke. Currently he's been using bacitracin on the toe and utilizing  a Band-Aid. He tells me he's had this wound for two years. He cannot remember the last time he had an x-ray. 12/19/18 on evaluation today patient appears to be doing well in regard to his plantar toe ulcer. He's been tolerating the dressing changes without complication. Fortunately there does not appear to be signs of infection at this time which is good news. No fever chills noted. 01/19/19 has been one month since I last  saw the patient as he was out of town. With that being said on evaluation today it does appear that he is going to require sharp debridement and I do believe that the total contact cast would benefit him as far as being applied at this time. He is in agreement that plan. 01/23/19 on evaluation today patient appears to be doing better in regard to his left great toe ulcer. He has been shown signs of improvement week by week and although this is slow it does seem to be doing fairly well which is good news. Overall very pleased in this regard. 01/31/19 on evaluation today patient presents for follow-up concerning his great toe ulcer. He was actually supposed to be here yesterday and he did come however he ended up having to leave due to his blood sugar dropping he states he just did not eat enough lunch. Fortunately seems to be doing much better today which is excellent news. Fortunately there's no evidence of infection at this time which is also good news. No fevers, chills, nausea, or vomiting noted at this time. Overall I feel like he is making excellent progress. 02/06/19 on evaluation today patient actually appears to be doing very well in regard to his plantar toe ulcer. He's been tolerating the dressing changes without complication. Fortunately there is no sign of infection at this time. Overall been very pleased with how things seem to be progressing. No fevers, chills, nausea, or vomiting noted at this time. 02/13/19 on evaluation today patient actually appears to be doing  excellent in regard to his toe ulcer which is almost completely close. Overall very pleased with that. There does not appear to be any signs of infection which is good news. Upon close inspection it appears that he has just a very slight opening still noted at the central portion of the toe although this is minimal. 02/16/19 on evaluation today patient is seen for early follow-up due to the fact that he tells me while at home he actually got up to go answer the door without putting on the walking boot part of his cast and subsequently damaged the total contact cast. It therefore comes in to have this removed and potentially replaced. Fortunately other than it given him a little bit of pain its far as pushing in on some areas there doesn't appear to be any injury once the cast was removed. 03/13/19 on evaluation today patient unfortunately presents for follow-up concerning his great toe ulcer on the left. He was discharged on 02/16/19 with the wound healed at that point. He tells me this remain so for several weeks or least a couple weeks before reopening. Fortunately there does not appear to be any signs of infection at this time. Nonetheless for the past week at least he's had the wound reopened and states it is been trying to keep pressure off of this but he has not been using the offloading shoe we previously gave him. Fortunately there's no evidence of infection at this time. No fevers, chills, nausea, or vomiting noted at this time. WELFORD, CHRISTMAS ([id number]) 03/17/19 on evaluation today patient actually appears to be doing rather well in regard to his toe also. Fact this appears to be significantly smaller this week even compared to what I saw last week on evaluation. He seems to have done very well for the offloading shoe and overall I'm very pleased with the progress that is made. It may be that he doesn't even need the cast to  get this to heal if he offloads this  appropriately. 03/24/19 on evaluation today patient actually appears to be doing well in regard to his plantar foot ulcer. He's been tolerating the dressing changes without complication the collagen does seem to be beneficial for him. With that being said he is gonna require some miles sharp debridement today to clear away some of the callous's around the edge of the wound as well as the minimal Slough noted on the surface of the wound. 03/31/19 on evaluation today patient appears to be doing well in regard to his plantar great toe ulcer. He has been tolerating the dressing changes without complication which is good news. Fortunately there does not appear to be any signs of active infection at this time. Overall very pleased with how things seem to be progressing. 04/11/19 on evaluation today patient's tools are appears to be doing in my pinion roughly about the same as were things that previous. Fortunately there does not appear to be signs of active infection at this time which is good news. With that being said he has not been experiencing any pain at this time which is good news there is also No fevers, chills, nausea, or vomiting noted at this time. 04/18/19 on evaluation today patient's wound actually appears to be doing fairly well today he did not even really have a lot of callous buildup. We have been debating on whether or not to reinitiate total contact cast. With that being said he seems to be doing fairly well this point with offloading in my pinion and again I think we need to come up with a way to get this healed that will also be sustainable for keeping it close. Again we were able to close it with the cast previous but again he has to be able to keep it such. For that reason we are working on trying to get him diabetic shoes he's waiting for the [facility] to get in touch with the local company that has to measure him for these do not want to have a cast on at such a time as when they need  to actually measure him. 04/25/19 on evaluation today patient appears to be doing well in regard to his plantar foot specifically great toe ulcer. He's been tolerating the dressing changes without complication. Fortunately there's no signs of active infection at this time. Overall I'm very pleased with how things appear currently. 05/02/19 on evaluation today patient appears to actually be doing better in regard to his plantar foot ulcer. He shown signs of good improvement which is excellent news. She states he can walk better with the cast on the can with the offloading shoe that he had on previous. Fortunately there's no signs of active infection at this time. No fevers, chills, nausea, or vomiting noted at this time. 05/09/19 on evaluation today patient actually appears to be doing excellent. In fact he appears to be completely healed based on evaluation at this time. Fortunately there's no signs of infection and is having no discomfort. 06/08/19 this is a follow-up visit although the patient has been healed for almost a month but not quite. He tells me that in the past several days he noted some blood coming from his toe and he thought he should come in at this checked out. Fortunately he's having a pain. He also did get his custom orthotics shoes. With that being said he still appears to have developed this ulceration there is something he tells me that  the orthotic specialist stated they could do to the toes to try to help out in this regard although he did not know exactly what it was we may need to check with [facility]. 06/12/19 on evaluation today patient actually appears to be doing great in regard to his foot ulcer. The wound on his toes shown signs of excellent improvement which is great news. With that being said he is not completely close yet the cast has done wonders for him and he is definitely headed in the correct direction. No fevers, chills, nausea, or vomiting noted at  this time. 06/22/19 on evaluation today fortunately patient actually appears to be healing quite well and in fact appears to be completely healed this is excellent news. Fortunately there's no signs of active infection he's been tolerating the total contact cast without complication. Overall I'm pleased with the progress that has been made. He also has his new custom shoes he brought was with him today. 07/04/19 on evaluation today patient actually appears to be doing a little bit worse in regard his toe ulcer unfortunately this has yet again reopened. It has only been about a week and a half since I've last seen him I really did not expect this to reopen this quickly. Nonetheless I did discuss with him his normal routine in order to see what may be causing this. He has custom shoes. With that being said he apparently has not been using the custom shoes and even when he has used them he has not LUKA, NABARRO. ([id number]) even put the insert in there which is what was custom molded to him in order to appropriately offload high-pressure point areas. In fact his exact question to me was whether or not he needed to actually put those in they just came loose in the box and he had never installed them. Nonetheless it also came to light the he's been walking around in his bedroom slippers at home he tells me he does not go barefoot but he really has not been wearing his custom offloading/diabetic shoes. Nonetheless I do believe that this is why he continually reopens as far as the wound is concerned. I discussed this with the patient in great detail today for yet again although I previously had this discussion with him in the past. 07/11/19-Patient returns at 1 week for his left great toe wound which is actually doing really well, he has been using the insert to offload the toe and is very pleased with it. This looks like this is working out really well for him 07/18/19 on evaluation today patient  appears to be doing really about the same with regard to his ulcer on the right great toe. He's been tolerating the dressing changes without complication. Fortunately there's no signs of active infection at this time. With that being said I think that we're having a difficult time getting this to heal and I do believe that initiating treatment with the total contact cast to get this to close would be a good idea. The good news is he seems to be keeping this from worsening so hopefully that means that if we get it closed he can prevent this from reopening in the future. At this point my suggestion was that we go ahead and initiate treatment at both sites utilizing collagen. We will then subsequently put a piece of silver out and it between the toes of the right foot in the webbing between the fourth and fifth toe in order  to help keep things dry and allow this to hopefully close and we heal without complication. The patient is in agreement with plan. Subsequently I am hopeful that both wounds will heal quite quickly we have been very close now with the ankle and again the toe has reopened but fortunately doesn't appear to be to bad at this point. No antibiotics were prescribed today as they did not appear to be necessary. 07/25/19 on evaluation today patient actually appears to be doing excellent in regard to his great toe ulcer. In fact this really appears to be almost completely healed if indeed it is not in fact healed. With that being said there is still the eighth small pinpoint area that could be open although I tend to doubt it. Nonetheless it is something that I would like to continue to watch for more week I do think it would be beneficial for [place] to go ahead and continue with the cast for one more week before deciding to discharge him especially in light of the issues he's had in the past. Specifically with regard to the wound reopening rather quickly. 08/01/2019 upon evaluation today patient  actually appears to be doing well with regard to his great toe ulcer. He has been tolerating the dressing changes without complication. Fortunately we think appears to be completely healed at this time which is great news. I am very pleased with the progress up to this point. In fact he appears to be completely healed at this point. Readmission: 09/15/2019 patient presents today for reevaluation here in our clinic concerning issues that he is having with his left great toe as he did previous. We have not actually seen him since August 01, 2019. He had been doing very well until he noticed when walking into the bathroom with just the socks on the other night that he had a little bit of bleeding from the toe location. Subsequently he decided to make an appointment to come in as he did not want this to get significantly worse. There does not appear to be any signs of active infection he tells me as long as he was wearing his shoes he seemed to be doing okay is walking around not necessarily barefoot but just with socks on that I think may be his main problem at this time. Electronic Signature(s) Signed: 09/15/2019 10:23:00 AM By: Worthy Keeler PA-C Entered By: Worthy Keeler on 09/15/2019 10:23:00 [name] ([id number]) -------------------------------------------------------------------------------- Physical Exam Details Patient Name: [name] Date of Service: 09/15/2019 9:45 AM Medical Record Number: [id number] Patient Account Number: [id number] Date of Birth/Sex: July 12, 1946 (72 y.o. M) Treating RN: Montey Hora Primary Care Provider: Glendon Axe Other Clinician: Referring Provider: Glendon Axe Treating Provider/Extender: STONE III, Kamrie Fanton Weeks in Treatment: 0 Constitutional patient is hypertensive.. pulse regular and within target range for patient.Marland Kitchen respirations regular, non-labored and within target range for patient.Marland Kitchen temperature within target range for  patient.. Well-nourished and well-hydrated in no acute distress. Eyes conjunctiva clear no eyelid edema noted. pupils equal round and reactive to light and accommodation. Ears, Nose, Mouth, and Throat no gross abnormality of ear auricles or external auditory canals. normal hearing noted during conversation. mucus membranes moist. Respiratory normal breathing without difficulty. clear to auscultation bilaterally. Cardiovascular regular rate and rhythm with normal S1, S2. 2+ dorsalis pedis/posterior tibialis pulses. no clubbing, cyanosis, significant edema, <3 sec cap refill. Gastrointestinal (GI) soft, non-tender, non-distended, +BS. no ventral hernia noted. Musculoskeletal normal gait and posture. no significant deformity or arthritic  changes, no loss or range of motion, no clubbing. Psychiatric this patient is able to make decisions and demonstrates good insight into disease process. Alert and Oriented x 3. pleasant and cooperative. Notes Patient's wound bed currently did show significant callus buildup along with some undermining this is good to require sharp debridement today. I was able to perform debridement without complication post debridement wound bed appears to be doing better. Nonetheless I do believe with the bleeding he may benefit from a silver alginate dressing today. We are also going to go ahead and apply the total contact cast as I feel like this is going to be helpful for him is really been the only thing in the past that we been able to do in order to keep him on the right path to healing everything else he tried typically does not really work. Electronic Signature(s) Signed: 09/15/2019 10:23:38 AM By: Worthy Keeler PA-C Entered By: Worthy Keeler on 09/15/2019 10:23:37 [name] ([id number]) -------------------------------------------------------------------------------- Physician Orders Details Patient Name: [name] Date of Service:  09/15/2019 9:45 AM Medical Record Number: [id number] Patient Account Number: [id number] Date of Birth/Sex: [birth year] (73 y.o. M) Treating RN: Montey Hora Primary Care Provider: Glendon Axe Other Clinician: Referring Provider: Glendon Axe Treating Provider/Extender: STONE III, Caressa Scearce Weeks in Treatment: 0 Verbal / Phone Orders: No Diagnosis Coding Wound Cleansing Wound #1 Left,Plantar Toe Great o Clean wound with Normal Saline. o May shower with protection. - Please do not get your cast wet Primary Wound Dressing Wound #1 Left,Plantar Toe Great o Silver Alginate Secondary Dressing Wound #1 Left,Plantar Toe Great o Foam Dressing Change Frequency Wound #1 Left,Plantar Toe Great o Change dressing every week Follow-up Appointments Wound #1 Left,Plantar Toe Great o Return Appointment in 1 week. Off-Loading Wound #1 Left,Plantar Toe Great o Total Contact Cast to Left Lower Extremity Electronic Signature(s) Signed: 09/15/2019 4:27:31 PM By: Montey Hora Signed: 09/15/2019 4:44:05 PM By: Worthy Keeler PA-C Entered By: Montey Hora on 09/15/2019 10:21:43 [place], [name] ([id number]) -------------------------------------------------------------------------------- Problem List Details Patient Name: LORENZO, BILLITER. Date of Service: 09/15/2019 9:45 AM Medical Record Number: [id number] Patient Account Number: [id number] Date of Birth/Sex: 12/18/1946 (73 y.o. 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/15/2019 No Yes layer exposed I10 Essential (primary) hypertension 09/15/2019 No  Yes E11.43 Type 2 diabetes mellitus with diabetic autonomic (poly) 09/15/2019 No Yes neuropathy Inactive Problems Resolved Problems Electronic Signature(s) Signed: 09/15/2019 10:22:01 AM By: Worthy Keeler PA-C Entered By: Worthy Keeler on 09/15/2019 10:22:01 [name] ([id number]) -------------------------------------------------------------------------------- Progress Note Details Patient Name: [name] Date of Service: 09/15/2019 9:45 AM Medical Record Number: [id number] Patient Account Number: [id number] Date of Birth/Sex: [birth year] (73 y.o. M) Treating RN: Montey Hora Primary Care Provider: Glendon Axe Other Clinician: Referring Provider: Glendon Axe Treating Provider/Extender: Melburn Hake, Aswad Wandrey Weeks in Treatment: 0 Subjective Chief Complaint Information obtained from Patient Left great toe ulcer History of Present Illness (HPI) 12/12/18 on evaluation today patient presents as a referral from his endocrinologist regarding issues that he has been having with his left great toe. Subsequently he has been seeing Dr. Cleda Mccreedy and Dr. Cleda Mccreedy has been the breeding the wound and in  fact this was debrided this morning. The patient had an appointment there prior to coming here. Subsequently he states that even though he's been going for regular debridement after office things really do not seem to be showing signs of improvement unfortunately. He states that he is having some discomfort but nothing too significant. No fevers, chills, nausea, or vomiting noted at this time. The patient does have a history of hypertension, neuropathy, diabetes mellitus type II, and a history of stroke. Currently he's been using bacitracin on the toe and utilizing a Band-Aid. He tells me he's had this wound for two years. He cannot remember the last time he had an x-ray. 12/19/18 on evaluation today patient appears to be doing well in regard to his plantar toe ulcer. He's been  tolerating the dressing changes without complication. Fortunately there does not appear to be signs of infection at this time which is good news. No fever chills noted. 01/19/19 has been one month since I last saw the patient as he was out of town. With that being said on evaluation today it does appear that he is going to require sharp debridement and I do believe that the total contact cast would benefit him as far as being applied at this time. He is in agreement that plan. 01/23/19 on evaluation today patient appears to be doing better in regard to his left great toe ulcer. He has been shown signs of improvement week by week and although this is slow it does seem to be doing fairly well which is good news. Overall very pleased in this regard. 01/31/19 on evaluation today patient presents for follow-up concerning his great toe ulcer. He was actually supposed to be here yesterday and he did come however he ended up having to leave due to his blood sugar dropping he states he just did not eat enough lunch. Fortunately seems to be doing much better today which is excellent news. Fortunately there's no evidence of infection at this time which is also good news. No fevers, chills, nausea, or vomiting noted at this time. Overall I feel like he is making excellent progress. 02/06/19 on evaluation today patient actually appears to be doing very well in regard to his plantar toe ulcer. He's been tolerating the dressing changes without complication. Fortunately there is no sign of infection at this time. Overall been very pleased with how things seem to be progressing. No fevers, chills, nausea, or vomiting noted at this time. 02/13/19 on evaluation today patient actually appears to be doing excellent in regard to his toe ulcer which is almost completely close. Overall very pleased with that. There does not appear to be any signs of infection which is good news. Upon close inspection it appears that he has  just a very slight opening still noted at the central portion of the toe although this is minimal. 02/16/19 on evaluation today patient is seen for early follow-up due to the fact that he tells me while at home he actually got up to go answer the door without putting on the walking boot part of his cast and subsequently damaged the total contact cast. It therefore comes in to have this removed and potentially replaced. Fortunately other than it given him a little bit of pain its far as pushing in on some areas there doesn't appear to be any injury once the cast was removed. LYMON, SEGERSTROM ([id number]) 03/13/19 on evaluation today patient unfortunately presents for follow-up concerning his great toe ulcer on the  left. He was discharged on 02/16/19 with the wound healed at that point. He tells me this remain so for several weeks or least a couple weeks before reopening. Fortunately there does not appear to be any signs of infection at this time. Nonetheless for the past week at least he's had the wound reopened and states it is been trying to keep pressure off of this but he has not been using the offloading shoe we previously gave him. Fortunately there's no evidence of infection at this time. No fevers, chills, nausea, or vomiting noted at this time. 03/17/19 on evaluation today patient actually appears to be doing rather well in regard to his toe also. Fact this appears to be significantly smaller this week even compared to what I saw last week on evaluation. He seems to have done very well for the offloading shoe and overall I'm very pleased with the progress that is made. It may be that he doesn't even need the cast to get this to heal if he offloads this appropriately. 03/24/19 on evaluation today patient actually appears to be doing well in regard to his plantar foot ulcer. He's been tolerating the dressing changes without complication the collagen does seem to be beneficial for him. With  that being said he is gonna require some miles sharp debridement today to clear away some of the callous's around the edge of the wound as well as the minimal Slough noted on the surface of the wound. 03/31/19 on evaluation today patient appears to be doing well in regard to his plantar great toe ulcer. He has been tolerating the dressing changes without complication which is good news. Fortunately there does not appear to be any signs of active infection at this time. Overall very pleased with how things seem to be progressing. 04/11/19 on evaluation today patient's tools are appears to be doing in my pinion roughly about the same as were things that previous. Fortunately there does not appear to be signs of active infection at this time which is good news. With that being said he has not been experiencing any pain at this time which is good news there is also No fevers, chills, nausea, or vomiting noted at this time. 04/18/19 on evaluation today patient's wound actually appears to be doing fairly well today he did not even really have a lot of callous buildup. We have been debating on whether or not to reinitiate total contact cast. With that being said he seems to be doing fairly well this point with offloading in my pinion and again I think we need to come up with a way to get this healed that will also be sustainable for keeping it close. Again we were able to close it with the cast previous but again he has to be able to keep it such. For that reason we are working on trying to get him diabetic shoes he's waiting for the [place] to get in touch with the local company that has to measure him for these do not want to have a cast on at such a time as when they need to actually measure him. 04/25/19 on evaluation today patient appears to be doing well in regard to his plantar foot specifically great toe ulcer. He's been tolerating the dressing changes without complication. Fortunately there's no signs of  active infection at this time. Overall I'm very pleased with how things appear currently. 05/02/19 on evaluation today patient appears to actually be doing better in regard to his  plantar foot ulcer. He shown signs of good improvement which is excellent news. She states he can walk better with the cast on the can with the offloading shoe that he had on previous. Fortunately there's no signs of active infection at this time. No fevers, chills, nausea, or vomiting noted at this time. 05/09/19 on evaluation today patient actually appears to be doing excellent. In fact he appears to be completely healed based on evaluation at this time. Fortunately there's no signs of infection and is having no discomfort. 06/08/19 this is a follow-up visit although the patient has been healed for almost a month but not quite. He tells me that in the past several days he noted some blood coming from his toe and he thought he should come in at this checked out. Fortunately he's having a pain. He also did get his custom orthotics shoes. With that being said he still appears to have developed this ulceration there is something he tells me that the orthotic specialist stated they could do to the toes to try to help out in this regard although he did not know exactly what it was we may need to check with [facility]. 06/12/19 on evaluation today patient actually appears to be doing great in regard to his foot ulcer. The wound on his toes shown signs of excellent improvement which is great news. With that being said he is not completely close yet the cast has done wonders for him and he is definitely headed in the correct direction. No fevers, chills, nausea, or vomiting noted at this time. 06/22/19 on evaluation today fortunately patient actually appears to be healing quite well and in fact appears to be completely healed this is excellent news. Fortunately there's no signs of active infection he's been tolerating the total  contact cast without complication. Overall I'm pleased with the progress that has been made. He also has his new custom shoes he brought was SHAHID, AMBURN ([id number]) with him today. 07/04/19 on evaluation today patient actually appears to be doing a little bit worse in regard his toe ulcer unfortunately this has yet again reopened. It has only been about a week and a half since I've last seen him I really did not expect this to reopen this quickly. Nonetheless I did discuss with him his normal routine in order to see what may be causing this. He has custom shoes. With that being said he apparently has not been using the custom shoes and even when he has used them he has not even put the insert in there which is what was custom molded to him in order to appropriately offload high-pressure point areas. In fact his exact question to me was whether or not he needed to actually put those in they just came loose in the box and he had never installed them. Nonetheless it also came to light the he's been walking around in his bedroom slippers at home he tells me he does not go barefoot but he really has not been wearing his custom offloading/diabetic shoes. Nonetheless I do believe that this is why he continually reopens as far as the wound is concerned. I discussed this with the patient in great detail today for yet again although I previously had this discussion with him in the past. 07/11/19-Patient returns at 1 week for his left great toe wound which is actually doing really well, he has been using the insert to offload the toe and is very pleased with it. This  looks like this is working out really well for him 07/18/19 on evaluation today patient appears to be doing really about the same with regard to his ulcer on the right great toe. He's been tolerating the dressing changes without complication. Fortunately there's no signs of active infection at this time. With that being said I think that  we're having a difficult time getting this to heal and I do believe that initiating treatment with the total contact cast to get this to close would be a good idea. The good news is he seems to be keeping this from worsening so hopefully that means that if we get it closed he can prevent this from reopening in the future. At this point my suggestion was that we go ahead and initiate treatment at both sites utilizing collagen. We will then subsequently put a piece of silver out and it between the toes of the right foot in the webbing between the fourth and fifth toe in order to help keep things dry and allow this to hopefully close and we heal without complication. The patient is in agreement with plan. Subsequently I am hopeful that both wounds will heal quite quickly we have been very close now with the ankle and again the toe has reopened but fortunately doesn't appear to be to bad at this point. No antibiotics were prescribed today as they did not appear to be necessary. 07/25/19 on evaluation today patient actually appears to be doing excellent in regard to his great toe ulcer. In fact this really appears to be almost completely healed if indeed it is not in fact healed. With that being said there is still the eighth small pinpoint area that could be open although I tend to doubt it. Nonetheless it is something that I would like to continue to watch for more week I do think it would be beneficial for [place] to go ahead and continue with the cast for one more week before deciding to discharge him especially in light of the issues he's had in the past. Specifically with regard to the wound reopening rather quickly. 08/01/2019 upon evaluation today patient actually appears to be doing well with regard to his great toe ulcer. He has been tolerating the dressing changes without complication. Fortunately we think appears to be completely healed at this time which is great news. I am very pleased with the  progress up to this point. In fact he appears to be completely healed at this point. Readmission: 09/15/2019 patient presents today for reevaluation here in our clinic concerning issues that he is having with his left great toe as he did previous. We have not actually seen him since August 01, 2019. He had been doing very well until he noticed when walking into the bathroom with just the socks on the other night that he had a little bit of bleeding from the toe location. Subsequently he decided to make an appointment to come in as he did not want this to get significantly worse. There does not appear to be any signs of active infection he tells me as long as he was wearing his shoes he seemed to be doing okay is walking around not necessarily barefoot but just with socks on that I think may be his main problem at this time. Patient History Information obtained from Patient. Allergies No Known Allergies Family History Diabetes - Mother, Heart Disease - Father, Stroke - Father, No family history of Cancer, Hereditary Spherocytosis, Hypertension, Kidney Disease, Lung Disease,  Seizures, Thyroid Problems, Tuberculosis. NELLIE, JOCHUM ([id number]) Social History Never smoker, Marital Status - Widowed, Alcohol Use - Never, Drug Use - No History, Caffeine Use - Moderate. Medical History Eyes Denies history of Cataracts, Glaucoma, Optic Neuritis Ear/Nose/Mouth/Throat Denies history of Chronic sinus problems/congestion, Middle ear problems Hematologic/Lymphatic Denies history of Anemia, Hemophilia, Human Immunodeficiency Virus, Lymphedema Respiratory Denies history of Aspiration, Asthma, Chronic Obstructive Pulmonary Disease (COPD), Pneumothorax, Sleep Apnea, Tuberculosis Cardiovascular Patient has history of Hypertension Denies history of Angina, Arrhythmia, Congestive Heart Failure, Coronary Artery Disease, Deep Vein Thrombosis, Hypotension, Myocardial Infarction, Peripheral Arterial  Disease, Peripheral Venous Disease, Phlebitis, Vasculitis Gastrointestinal Denies history of Cirrhosis , Colitis, Crohn s, Hepatitis A, Hepatitis B, Hepatitis C Endocrine Patient has history of Type II Diabetes Denies history of Type I Diabetes Genitourinary Denies history of End Stage Renal Disease Immunological Denies history of Lupus Erythematosus, Raynaud s, Scleroderma Integumentary (Skin) Denies history of History of Burn, History of pressure wounds Musculoskeletal Denies history of Gout, Rheumatoid Arthritis, Osteoarthritis, Osteomyelitis Neurologic Denies history of Dementia, Neuropathy, Quadriplegia, Paraplegia, Seizure Disorder Oncologic Denies history of Received Chemotherapy, Received Radiation Review of Systems (ROS) Eyes Denies complaints or symptoms of Dry Eyes, Vision Changes, Glasses / Contacts. Ear/Nose/Mouth/Throat Denies complaints or symptoms of Difficult clearing ears, Sinusitis. Hematologic/Lymphatic Denies complaints or symptoms of Bleeding / Clotting Disorders, Human Immunodeficiency Virus. Respiratory Denies complaints or symptoms of Chronic or frequent coughs, Shortness of Breath. Gastrointestinal Denies complaints or symptoms of Frequent diarrhea, Nausea, Vomiting. Genitourinary Denies complaints or symptoms of Kidney failure/ Dialysis, Incontinence/dribbling. Immunological Denies complaints or symptoms of Hives, Itching. Integumentary (Skin) Denies complaints or symptoms of Wounds, Bleeding or bruising tendency, Breakdown, Swelling. Musculoskeletal Denies complaints or symptoms of Muscle Pain, Muscle Weakness. Neurologic Denies complaints or symptoms of Numbness/parasthesias, Focal/Weakness. Psychiatric Denies complaints or symptoms of Anxiety, Claustrophobia. TYCHO, VANDEWATER ([id number]) Objective Constitutional patient is hypertensive.. pulse regular and within target range for patient.Marland Kitchen respirations regular, non-labored and within  target range for patient.Marland Kitchen temperature within target range for patient.. Well-nourished and well-hydrated in no acute distress. Vitals Time Taken: 9:35 AM, Height: 72 in, Source: Stated, Weight: 256 lbs, Source: Stated, BMI: 34.7, Temperature: 98.2 F, Pulse: 73 bpm, Respiratory Rate: 16 breaths/min, Blood Pressure: 173/73 mmHg. Eyes conjunctiva clear no eyelid edema noted. pupils equal round and reactive to light and accommodation. Ears, Nose, Mouth, and Throat no gross abnormality of ear auricles or external auditory canals. normal hearing noted during conversation. mucus membranes moist. Respiratory normal breathing without difficulty. clear to auscultation bilaterally. Cardiovascular regular rate and rhythm with normal S1, S2. 2+ dorsalis pedis/posterior tibialis pulses. no clubbing, cyanosis, significant edema, Gastrointestinal (GI) soft, non-tender, non-distended, +BS. no ventral hernia noted. Musculoskeletal normal gait and posture. no significant deformity or arthritic changes, no loss or range of motion, no clubbing. Psychiatric this patient is able to make decisions and demonstrates good insight into disease process. Alert and Oriented x 3. pleasant and cooperative. General Notes: Patient's wound bed currently did show significant callus buildup along with some undermining this is good to require sharp debridement today. I was able to perform debridement without complication post debridement wound bed appears to be doing better. Nonetheless I do believe with the bleeding he may benefit from a silver alginate dressing today. We are also going to go ahead and apply the total contact cast as I feel like this is going to be helpful for him is really been the only thing in the past that we been able to do in order  to keep him on the right path to healing everything else he tried typically does not really work. Integumentary (Hair, Skin) Wound #1 status is Open. Original cause of  wound was Gradually Appeared. The wound is located on the [employer]. The wound measures 0.5cm length x 0.6cm width x 0.3cm depth; 0.236cm^2 area and 0.071cm^3 volume. There is Fat Layer (Subcutaneous Tissue) Exposed exposed. There is no tunneling noted, however, there is undermining starting at 10:00 and ending at 12:00 with a maximum distance of 0.5cm. There is a medium amount of serous drainage noted. The wound margin is flat and intact. There is small (1-33%) pale granulation within the wound bed. There is a large (67-100%) amount of necrotic tissue within the wound bed including Adherent Slough. AUSITN, MANY ([id number]) Assessment Active Problems ICD-10 Type 2 diabetes mellitus with foot ulcer Non-pressure chronic ulcer of other part of left foot with fat layer exposed Essential (primary) hypertension Type 2 diabetes mellitus with diabetic autonomic (poly)neuropathy Procedures Wound #1 Pre-procedure diagnosis of Wound #1 is a Diabetic Wound/Ulcer of the Lower Extremity located on the Left,Plantar Toe Great .Severity of Tissue Pre Debridement is: Fat layer exposed. There was a Excisional Skin/Subcutaneous Tissue Debridement with a total area of 0.3 sq cm performed by STONE III, Yaira Bernardi E., PA-C. With the following instrument(s): Curette to remove Viable and Non-Viable tissue/material. Material removed includes Callus, Subcutaneous Tissue, and Slough after achieving pain control using Lidocaine 4% Topical Solution. No specimens were taken. A time out was conducted at 10:15, prior to the start of the procedure. A Minimum amount of bleeding was controlled with Pressure. The procedure was tolerated well with a pain level of 0 throughout and a pain level of 0 following the procedure. Post Debridement Measurements: 0.6cm length x 0.6cm width x 0.5cm depth; 0.141cm^3 volume. Character of Wound/Ulcer Post Debridement is improved. Severity of Tissue Post Debridement is: Fat  layer exposed. Post procedure Diagnosis Wound #1: Same as Pre-Procedure Pre-procedure diagnosis of Wound #1 is a Diabetic Wound/Ulcer of the Lower Extremity located on the Left,Plantar Toe Great . There was a Total Contact Cast Procedure by STONE III, Basil Buffin E., PA-C. Post procedure Diagnosis Wound #1: Same as Pre-Procedure Plan Wound Cleansing: Wound #1 Left,Plantar Toe Great: Clean wound with Normal Saline. May shower with protection. - Please do not get your cast wet Primary Wound Dressing: Wound #1 Left,Plantar Toe Great: Silver Alginate Secondary Dressing: Wound #1 Left,Plantar Toe Great: Foam Dressing Change Frequency: Wound #1 Left,Plantar Toe Great: Change dressing every week Follow-up Appointments: Wound #1 Left,Plantar Toe Great: Return Appointment in 1 week. Off-Loading: JAXZEN, ENTIN ([id number]) Wound #1 Left,Plantar Toe Great: Total Contact Cast to Left Lower Extremity 1. I am in a suggest that we initiate a silver alginate dressing for the great toe on the left foot. 2. We will also go ahead and initiate the application of total contact cast which again is been the only thing that is healed him in the past. 3. I had a long discussion with him about the fact that he needs to avoid walking even and just his socks around the house this is still something that can cause rubbing and pressure to the toe area especially with the way his foot functions. Nonetheless he states that something that is can be more cautious of in the future. Otherwise with his custom shoes he seems to do fairly well he tells me. We will see patient back for reevaluation in 1 week here in the clinic.  If anything worsens or changes patient will contact our office for additional recommendations. Electronic Signature(s) Signed: 09/15/2019 10:24:25 AM By: Worthy Keeler PA-C Entered By: Worthy Keeler on 09/15/2019 10:24:25 [name]  ([id number]) -------------------------------------------------------------------------------- ROS/PFSH Details Patient Name: [name] Date of Service: 09/15/2019 9:45 AM Medical Record Number: [id number] Patient Account Number: [id number] Date of Birth/Sex: [birth year] (73 y.o. M) Treating RN: Harold Barban Primary Care Provider: Glendon Axe Other Clinician: Referring Provider: Glendon Axe Treating Provider/Extender: STONE III, Kearia Yin Weeks in Treatment: 0 Information Obtained From Patient Eyes Complaints and Symptoms: Negative for: Dry Eyes; Vision Changes; Glasses / Contacts Medical History: Negative for: Cataracts; Glaucoma; Optic Neuritis Ear/Nose/Mouth/Throat Complaints and Symptoms: Negative for: Difficult clearing ears; Sinusitis Medical History: Negative for: Chronic sinus problems/congestion; Middle ear problems Hematologic/Lymphatic Complaints and Symptoms: Negative for: Bleeding / Clotting Disorders; Human Immunodeficiency Virus Medical History: Negative for: Anemia; Hemophilia; Human Immunodeficiency Virus; Lymphedema Respiratory Complaints and Symptoms: Negative for: Chronic or frequent coughs; Shortness of Breath Medical History: Negative for: Aspiration; Asthma; Chronic Obstructive Pulmonary Disease (COPD); Pneumothorax; Sleep Apnea; Tuberculosis Gastrointestinal Complaints and Symptoms: Negative for: Frequent diarrhea; Nausea; Vomiting Medical History: Negative for: Cirrhosis ; Colitis; Crohnos; Hepatitis A; Hepatitis B; Hepatitis C Genitourinary Complaints and Symptoms: Negative for: Kidney failure/ Dialysis; Incontinence/dribbling Medical History: IZEKIEL, GERARDOT ([id number]) Negative for: End Stage Renal Disease Immunological Complaints and Symptoms: Negative for: Hives; Itching Medical History: Negative for: Lupus Erythematosus; Raynaudos; Scleroderma Integumentary (Skin) Complaints and Symptoms: Negative for: Wounds;  Bleeding or bruising tendency; Breakdown; Swelling Medical History: Negative for: History of Burn; History of pressure wounds Musculoskeletal Complaints and Symptoms: Negative for: Muscle Pain; Muscle Weakness Medical History: Negative for: Gout; Rheumatoid Arthritis; Osteoarthritis; Osteomyelitis Neurologic Complaints and Symptoms: Negative for: Numbness/parasthesias; Focal/Weakness Medical History: Negative for: Dementia; Neuropathy; Quadriplegia; Paraplegia; Seizure Disorder Psychiatric Complaints and Symptoms: Negative for: Anxiety; Claustrophobia Cardiovascular Medical History: Positive for: Hypertension Negative for: Angina; Arrhythmia; Congestive Heart Failure; Coronary Artery Disease; Deep Vein Thrombosis; Hypotension; Myocardial Infarction; Peripheral Arterial Disease; Peripheral Venous Disease; Phlebitis; Vasculitis Endocrine Medical History: Positive for: Type II Diabetes Negative for: Type I Diabetes Time with diabetes: 1990 Treated with: Insulin Blood sugar tested every day: Yes Tested : Oncologic DESMAN, BALLARD ([id number]) Medical History: Negative for: Received Chemotherapy; Received Radiation Immunizations Pneumococcal Vaccine: Received Pneumococcal Vaccination: Yes Implantable Devices None Family and Social History Cancer: No; Diabetes: Yes - Mother; Heart Disease: Yes - Father; Hereditary Spherocytosis: No; Hypertension: No; Kidney Disease: No; Lung Disease: No; Seizures: No; Stroke: Yes - Father; Thyroid Problems: No; Tuberculosis: No; Never smoker; Marital Status - Widowed; Alcohol Use: Never; Drug Use: No History; Caffeine Use: Moderate; Financial Concerns: No; Food, Clothing or Shelter Needs: No; Support System Lacking: No; Transportation Concerns: No Electronic Signature(s) Signed: 09/15/2019 3:42:00 PM By: Harold Barban Signed: 09/15/2019 4:44:05 PM By: Worthy Keeler PA-C Entered By: Harold Barban on 09/15/2019 09:43:29 Bandana,  [name] ([id number]) -------------------------------------------------------------------------------- Total Contact Cast Details Patient Name: JAWANZA, VOLESKY. Date of Service: 09/15/2019 9:45 AM Medical Record Number: [id number] Patient Account Number: [id number] Date of Birth/Sex: 09-Apr-1946 (73 y.o. M) Treating RN: Montey Hora Primary Care Provider: Glendon Axe Other Clinician: Referring Provider: Glendon Axe Treating Provider/Extender: STONE III, Fate Caster Weeks in Treatment: 0 Total Contact Cast Applied for Wound Assessment: Wound #1 Left,Plantar Toe Great Performed By: Physician Emilio Math., PA-C Post Procedure Diagnosis Same as Pre-procedure Electronic Signature(s) Signed: 09/15/2019 4:27:31 PM By: Montey Hora Signed: 09/15/2019 4:44:05 PM By: Worthy Keeler PA-C Entered By:  Montey Hora on 09/15/2019 10:20:29 SANI, GOLDSBERRY ([id number]) -------------------------------------------------------------------------------- SuperBill Details Patient Name: IRVINE, MORDEN. Date of Service: 09/15/2019 Medical Record Number: [id number] Patient Account Number: [id number] Date of Birth/Sex: October 15, 1946 (73 y.o. 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 Facility Procedures CPT4 Code: [id number] Description: [facility] VISIT-LEV 3 EST PT Modifier: Quantity: 1 CPT4 Code: [id number] Description: [id number] - DEB SUBQ TISSUE 20 SQ CM/< ICD-10 Diagnosis Description L97.522 Non-pressure chronic ulcer of other part of left  foot with fat Modifier: layer exposed Quantity: 1 Physician Procedures CPT4 Code: [id number] Description: [id number] - WC PHYS LEVEL 4 - EST PT ICD-10 Diagnosis Description 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

## 2019-09-15 NOTE — Progress Notes (Signed)
[name], [name] ([id number]) Visit Report for 09/15/2019 Abuse/Suicide Risk Screen Details Patient Name: [name], [name]. Date of Service: 09/15/2019 9:45 AM Medical Record Number: [id number] Patient Account Number: [id number] Date of Birth/Sex: 09-May-1946 (73 y.o. M) Treating RN: Harold Barban Primary Care Anson Peddie: Glendon Axe Other Clinician: Referring Tylar Amborn: Glendon Axe Treating Eliott Amparan/Extender: STONE III, HOYT Weeks in Treatment: 0 Abuse/Suicide Risk Screen Items Answer ABUSE RISK SCREEN: Has anyone close to you tried to hurt or harm you recentlyo No Do you feel uncomfortable with anyone in your familyo No Has anyone forced you do things that you didnot want to doo No Electronic Signature(s) Signed: 09/15/2019 3:42:00 PM By: Harold Barban Entered By: Harold Barban on 09/15/2019 09:43:39 [place], [name] ([id number]) -------------------------------------------------------------------------------- Activities of Daily Living Details Patient Name: [name] Date of Service: 09/15/2019 9:45 AM Medical Record Number: [id number] Patient Account Number: [id number] Date of Birth/Sex: 03-Feb-1946 (73 y.o. M) Treating RN: Harold Barban Primary Care Josslynn Mentzer: Glendon Axe Other Clinician: Referring Kryssa Risenhoover: Glendon Axe Treating Deondrea Aguado/Extender: STONE III, HOYT Weeks in Treatment: 0 Activities of Daily Living Items Answer Activities of Daily Living (Please select one for each item) Drive Automobile Completely Able Take Medications Completely Able Use Telephone Completely Able Care for Appearance Completely Able Use Toilet Completely Able Bath / Shower Completely Able Dress Self Completely Able Feed Self Completely Able Walk Completely Able Get In / Out Bed Completely Able Housework Completely Able Prepare Meals Completely Able Handle Money Completely Able Shop for Self Completely Able Electronic Signature(s) Signed:  09/15/2019 3:42:00 PM By: Harold Barban Entered By: Harold Barban on 09/15/2019 09:43:51 [place], [name] ([id number]) -------------------------------------------------------------------------------- Education Screening Details Patient Name: [name] Date of Service: 09/15/2019 9:45 AM Medical Record Number: [id number] Patient Account Number: [id number] Date of Birth/Sex: Jan 01, 1946 (73 y.o. M) Treating RN: Harold Barban Primary Care Mcarthur Ivins: Glendon Axe Other Clinician: Referring [place]: Glendon Axe Treating Gresham Caetano/Extender: Melburn Hake, HOYT Weeks in Treatment: 0 Learning Preferences/Education Level/Primary Language Learning Preference: Explanation Highest Education Level: College or Above Preferred Language: English Cognitive Barrier Language Barrier: No Translator Needed: No Memory Deficit: No Emotional Barrier: No Cultural/Religious Beliefs Affecting Medical Care: No Physical Barrier Impaired Vision: No Impaired Hearing: No Decreased Hand dexterity: No Knowledge/Comprehension Knowledge Level: High Comprehension Level: High Ability to understand written High instructions: Ability to understand verbal High instructions: Motivation Anxiety Level: Calm Cooperation: Cooperative Education Importance: Acknowledges Need Interest in Health Problems: Asks Questions Perception: Coherent Willingness to Engage in Self- High Management Activities: Readiness to Engage in Self- High Management Activities: Electronic Signature(s) Signed: 09/15/2019 3:42:00 PM By: Harold Barban Entered By: Harold Barban on 09/15/2019 09:44:13 [place], [name] ([id number]) -------------------------------------------------------------------------------- Fall Risk Assessment Details Patient Name: [name] Date of Service: 09/15/2019 9:45 AM Medical Record Number: [id number] Patient Account Number: [id number] Date of Birth/Sex: 03/25/1946  (73 y.o. M) Treating RN: Harold Barban Primary Care Rece Zechman: Glendon Axe Other Clinician: Referring Tenley Winward: Glendon Axe Treating Juleah Paradise/Extender: STONE III, HOYT Weeks in Treatment: 0 Fall Risk Assessment Items Have you had 2 or more falls in the last 12 monthso 0 No Have you had any fall that resulted in injury in the last 12 monthso 0 No FALLS RISK SCREEN History of falling - immediate or within 3 months 0 No Secondary diagnosis (Do you have 2 or more medical diagnoseso) 0 No Ambulatory aid None/bed rest/wheelchair/nurse 0 No Crutches/cane/walker 0 No Furniture 0 No Intravenous therapy Access/Saline/Heparin Lock 0 No Gait/Transferring Normal/ bed rest/ wheelchair 0 No Weak (short steps with or  without shuffle, stooped but able to lift head while 0 No walking, may seek support from furniture) Impaired (short steps with shuffle, may have difficulty arising from chair, head 0 No down, impaired balance) Mental Status Oriented to own ability 0 Yes Electronic Signature(s) Signed: 09/15/2019 3:42:00 PM By: Harold Barban Entered By: Harold Barban on 09/15/2019 09:44:22 [place], [name] ([id number]) -------------------------------------------------------------------------------- Foot Assessment Details Patient Name: [name]. Date of Service: 09/15/2019 9:45 AM Medical Record Number: [id number] Patient Account Number: [id number] Date of Birth/Sex: 1946-03-19 (73 y.o. M) Treating RN: Harold Barban Primary Care Avery Eustice: Glendon Axe Other Clinician: Referring Ashleyann Shoun: Glendon Axe Treating Hudsyn Barich/Extender: STONE III, HOYT Weeks in Treatment: 0 Foot Assessment Items Site Locations + = Sensation present, - = Sensation absent, C = Callus, U = Ulcer R = Redness, W = Warmth, M = Maceration, PU = Pre-ulcerative lesion F = Fissure, S = Swelling, D = Dryness Assessment Right: Left: Other Deformity: No No Prior Foot Ulcer: No No Prior  Amputation: No No Charcot Joint: No No Ambulatory Status: Ambulatory Without Help Gait: Steady Electronic Signature(s) Signed: 09/15/2019 3:42:00 PM By: Harold Barban Entered By: Harold Barban on 09/15/2019 09:45:29 [place], [name] ([id number]) -------------------------------------------------------------------------------- Nutrition Risk Screening Details Patient Name: [name] Date of Service: 09/15/2019 9:45 AM Medical Record Number: [id number] Patient Account Number: [id number] Date of Birth/Sex: 1946-10-20 (73 y.o. M) Treating RN: Harold Barban Primary Care Ramond Darnell: Glendon Axe Other Clinician: Referring Encarnacion Scioneaux: Glendon Axe Treating Dayanara Sherrill/Extender: STONE III, HOYT Weeks in Treatment: 0 Height (in): 72 Weight (lbs): 256 Body Mass Index (BMI): 34.7 Nutrition Risk Screening Items Score Screening NUTRITION RISK SCREEN: I have an illness or condition that made me change the kind and/or amount of 0 No food I eat I eat fewer than two meals per day 0 No I eat few fruits and vegetables, or milk products 0 No I have three or more drinks of beer, liquor or wine almost every day 0 No I have tooth or mouth problems that make it hard for me to eat 0 No I don't always have enough money to buy the food I need 0 No I eat alone most of the time 0 No I take three or more different prescribed or over-the-counter drugs a day 1 Yes Without wanting to, I have lost or gained 10 pounds in the last six months 0 No I am not always physically able to shop, cook and/or feed myself 0 No Nutrition Protocols Good Risk Protocol Moderate Risk Protocol High Risk Proctocol Risk Level: Good Risk Score: 1 Electronic Signature(s) Signed: 09/15/2019 3:42:00 PM By: Harold Barban Entered By: Harold Barban on 09/15/2019 09:44:31

## 2019-09-18 DIAGNOSIS — E1159 Type 2 diabetes mellitus with other circulatory complications: Secondary | ICD-10-CM | POA: Insufficient documentation

## 2019-09-18 DIAGNOSIS — I10 Essential (primary) hypertension: Secondary | ICD-10-CM | POA: Insufficient documentation

## 2019-09-22 ENCOUNTER — Other Ambulatory Visit: Payer: Self-pay

## 2019-09-22 ENCOUNTER — Encounter: Payer: Medicare Other | Admitting: Physician Assistant

## 2019-09-22 DIAGNOSIS — E11621 Type 2 diabetes mellitus with foot ulcer: Secondary | ICD-10-CM | POA: Diagnosis not present

## 2019-09-22 NOTE — Progress Notes (Signed)
Fred, [name] ([id number]) Visit Report for 09/22/2019 Arrival Information Details Patient Name: [name], [name]. Date of Service: 09/22/2019 10:45 AM Medical Record Number: [id number] Patient Account Number: [id number] Date of Birth/Sex: 08-07-1946 (73 y.o. M) Treating RN: Harold Barban Primary Care Gwendolin Briel: Glendon Axe Other Clinician: Referring Thai Hemrick: Glendon Axe Treating Jamicia Haaland/Extender: STONE III, HOYT Weeks in Treatment: 1 Visit Information History Since Last Visit Added or deleted any medications: No Patient Arrived: Cane Any new allergies or adverse reactions: No Arrival Time: 10:42 Had a fall or experienced change in No Accompanied By: self activities of daily living that may affect Transfer Assistance: None risk of falls: Patient Identification Verified: Yes Signs or symptoms of abuse/neglect since last visito No Secondary Verification Process Completed: Yes Hospitalized since last visit: No Has Dressing in Place as Prescribed: Yes Has Compression in Place as Prescribed: Yes Has Footwear/Offloading in Place as Prescribed: Yes Left: Total Contact Cast Pain Present Now: No Electronic Signature(s) Signed: 09/22/2019 4:30:52 PM By: Harold Barban Entered By: Harold Barban on 09/22/2019 10:43:37 [place], [name] ([id number]) -------------------------------------------------------------------------------- Encounter Discharge Information Details Patient Name: [name]. Date of Service: 09/22/2019 10:45 AM Medical Record Number: [id number] Patient Account Number: [id number] Date of Birth/Sex: [birth year] (73 y.o. M) Treating RN: Montey Hora Primary Care Tyller Bowlby: Glendon Axe Other Clinician: Referring Eliana Lueth: Glendon Axe Treating Swayze Kozuch/Extender: STONE III, HOYT Weeks in Treatment: 1 Encounter Discharge Information Items Post Procedure Vitals Discharge Condition: Stable Temperature (F): 98.6 Ambulatory  Status: Ambulatory Pulse (bpm): 75 Discharge Destination: Home Respiratory Rate (breaths/min): 16 Transportation: Private Auto Blood Pressure (mmHg): 169/84 Accompanied By: self Schedule Follow-up Appointment: Yes Clinical Summary of Care: Electronic Signature(s) Signed: 09/22/2019 4:44:47 PM By: Montey Hora Entered By: Montey Hora on 09/22/2019 11:06:22 [name] ([id number]) -------------------------------------------------------------------------------- Lower Extremity Assessment Details Patient Name: [name]. Date of Service: 09/22/2019 10:45 AM Medical Record Number: [id number] Patient Account Number: [id number] Date of Birth/Sex: Jan 14, 1946 (73 y.o. M) Treating RN: Harold Barban Primary Care Dayln Tugwell: Glendon Axe Other Clinician: Referring Cahlil Sattar: Glendon Axe Treating Ralph Brouwer/Extender: STONE III, HOYT Weeks in Treatment: 1 Edema Assessment Assessed: [Left: No] [Right: No] Edema: [Left: N] [Right: o] Calf Left: Right: Point of Measurement: 34 cm From Medial Instep 36.2 cm cm Ankle Left: Right: Point of Measurement: 10 cm From Medial Instep 23 cm cm Vascular Assessment Pulses: Dorsalis Pedis Palpable: [Left:Yes] Electronic Signature(s) Signed: 09/22/2019 4:30:52 PM By: Harold Barban Entered By: Harold Barban on 09/22/2019 10:52:03 [name] ([id number]) -------------------------------------------------------------------------------- Multi Wound Chart Details Patient Name: [name]. Date of Service: 09/22/2019 10:45 AM Medical Record Number: [id number] Patient Account Number: [id number] Date of Birth/Sex: [birth year] (73 y.o. M) Treating RN: Montey Hora Primary Care Praneeth Bussey: Glendon Axe Other Clinician: Referring Elvie Maines: Glendon Axe Treating Amador Braddy/Extender: STONE III, HOYT Weeks in Treatment: 1 Vital Signs Height(in): 72 Pulse(bpm): 75 Weight(lbs): 256 Blood Pressure(mmHg):  169/84 Body Mass Index(BMI): 35 Temperature(F): 98.6 Respiratory Rate 18 (breaths/min): Photos: [N/A:N/A] Wound Location: Left Toe Great - Plantar N/A N/A Wounding Event: Gradually Appeared N/A N/A Primary Etiology: Diabetic Wound/Ulcer of the N/A N/A Lower Extremity Comorbid History: Hypertension, Type II Diabetes N/A N/A Date Acquired: 09/07/2019 N/A N/A Weeks of Treatment: 1 N/A N/A Wound Status: Open N/A N/A Measurements L x W x D 0.2x0.3x0.1 N/A N/A (cm) Area (cm) : 0.047 N/A N/A Volume (cm) : 0.005 N/A N/A % Reduction in Area: 80.10% N/A N/A % Reduction in Volume: 93.00% N/A N/A Classification: Grade 2 N/A N/A Exudate Amount: Medium N/A N/A Exudate Type:  Serous N/A N/A Exudate Color: amber N/A N/A Wound Margin: Flat and Intact N/A N/A Granulation Amount: None Present (0%) N/A N/A Necrotic Amount: Large (67-100%) N/A N/A Necrotic Tissue: Eschar N/A N/A Exposed Structures: Fascia: No N/A N/A Fat Layer (Subcutaneous Tissue) Exposed: No Tendon: No Muscle: No Joint: No Bone: No MUAD, KIRCH ([id number]) Epithelialization: Large (67-100%) N/A N/A Treatment Notes Electronic Signature(s) Signed: 09/22/2019 4:44:47 PM By: Montey Hora Entered By: Montey Hora on 09/22/2019 10:59:59 [name] ([id number]) -------------------------------------------------------------------------------- [place] Details Patient Name: [name]. Date of Service: 09/22/2019 10:45 AM Medical Record Number: [id number] Patient Account Number: [id number] Date of Birth/Sex: [birth year] (73 y.o. M) Treating RN: Montey Hora Primary Care Arleny Kruger: Glendon Axe Other Clinician: Referring Kirstin Kugler: Glendon Axe Treating Laquinta Hazell/Extender: STONE III, HOYT Weeks in Treatment: 1 Active Inactive Abuse / Safety / Falls / Self Care Management Nursing Diagnoses: Potential for falls Goals: Patient will not experience any injury related  to falls Date Initiated: 09/15/2019 Target Resolution Date: 12/02/2019 Goal Status: Active Interventions: Assess fall risk on admission and as needed Notes: Peripheral Neuropathy Nursing Diagnoses: Potential alteration in peripheral tissue perfusion (select prior to confirmation of diagnosis) Goals: Patient/caregiver will verbalize understanding of disease process and disease management Date Initiated: 09/15/2019 Target Resolution Date: 12/02/2019 Goal Status: Active Interventions: Assess signs and symptoms of neuropathy upon admission and as needed Notes: Wound/Skin Impairment Nursing Diagnoses: Impaired tissue integrity Goals: Ulcer/skin breakdown will heal within 14 weeks Date Initiated: 09/15/2019 Target Resolution Date: 12/02/2019 Goal Status: Active Interventions: Assess patient/caregiver ability to obtain necessary supplies MELISSA, CLEMENTE ([id number]) Assess patient/caregiver ability to perform ulcer/skin care regimen upon admission and as needed Assess ulceration(s) every visit Notes: Electronic Signature(s) Signed: 09/22/2019 4:44:47 PM By: Montey Hora Entered By: Montey Hora on 09/22/2019 10:59:52 [place], [name] ([id number]) -------------------------------------------------------------------------------- Pain Assessment Details Patient Name: [name]. Date of Service: 09/22/2019 10:45 AM Medical Record Number: [id number] Patient Account Number: [id number] Date of Birth/Sex: 1946-07-20 (74 y.o. M) Treating RN: Harold Barban Primary Care Chade Pitner: Glendon Axe Other Clinician: Referring Shamya Macfadden: Glendon Axe Treating Hadassa Cermak/Extender: STONE III, HOYT Weeks in Treatment: 1 Active Problems Location of Pain Severity and Description of Pain Patient Has Paino No Site Locations Pain Management and Medication Current Pain Management: Electronic Signature(s) Signed: 09/22/2019 4:30:52 PM By: Harold Barban Entered By: Harold Barban on 09/22/2019 10:44:14 [name] ([id number]) -------------------------------------------------------------------------------- Patient/Caregiver Education Details Patient Name: [name]. Date of Service: 09/22/2019 10:45 AM Medical Record Number: [id number] Patient Account Number: [id number] Date of Birth/Gender: 1946/09/07 (73 y.o. M) Treating RN: Montey Hora Primary Care Physician: Glendon Axe Other Clinician: Referring Physician: Glendon Axe Treating Physician/Extender: Sharalyn Ink in Treatment: 1 Education Assessment Education Provided To: Patient Education Topics Provided Offloading: Handouts: Other: continued need for offloading Methods: Explain/Verbal Responses: State content correctly Electronic Signature(s) Signed: 09/22/2019 4:44:47 PM By: Montey Hora Entered By: Montey Hora on 09/22/2019 11:03:35 [place], [name] ([id number]) -------------------------------------------------------------------------------- Wound Assessment Details Patient Name: [name]. Date of Service: 09/22/2019 10:45 AM Medical Record Number: [id number] Patient Account Number: [id number] Date of Birth/Sex: 12/20/1946 (73 y.o. M) Treating RN: Harold Barban Primary Care Magdaline Zollars: Glendon Axe Other Clinician: Referring Keyonna Comunale: Glendon Axe Treating Ramatoulaye Pack/Extender: STONE III, HOYT Weeks in Treatment: 1 Wound Status Wound Number: 1 Primary Etiology: Diabetic Wound/Ulcer of the Lower Extremity Wound Location: Left Toe Great - Plantar Wound Status: Open Wounding Event: Gradually Appeared Comorbid Hypertension, Type II Diabetes Date Acquired: 09/07/2019 History: Weeks Of Treatment: 1 Clustered  Wound: No Photos Wound Measurements Length: (cm) 0.2 % Reduction i Width: (cm) 0.3 % Reduction i Depth: (cm) 0.1 Epithelializa Area: (cm) 0.047 Tunneling: Volume: (cm) 0.005 Undermining: n Area: 80.1% n Volume:  93% tion: Large (67-100%) No No Wound Description Classification: Grade 2 Foul Odor Aft Wound Margin: Flat and Intact Slough/Fibrin Exudate Amount: Medium Exudate Type: Serous Exudate Color: amber er Cleansing: No o Yes Wound Bed Granulation Amount: None Present (0%) Exposed Structure Necrotic Amount: Large (67-100%) Fascia Exposed: No Necrotic Quality: Eschar Fat Layer (Subcutaneous Tissue) Exposed: No Tendon Exposed: No Muscle Exposed: No Joint Exposed: No Bone Exposed: No Treatment Notes FULVIO, THRAILKILL ([id number]) Wound #1 (Left, Plantar Toe Great) Notes prisma, foam and TCC on left Electronic Signature(s) Signed: 09/22/2019 4:30:52 PM By: Harold Barban Entered By: Harold Barban on 09/22/2019 10:51:33 [place], [name] ([id number]) -------------------------------------------------------------------------------- [place] Details Patient Name: [name]. Date of Service: 09/22/2019 10:45 AM Medical Record Number: [id number] Patient Account Number: [id number] Date of Birth/Sex: 1946/08/30 (73 y.o. M) Treating RN: Harold Barban Primary Care Breyson Kelm: Glendon Axe Other Clinician: Referring Violia Knopf: Glendon Axe Treating Amahri Dengel/Extender: STONE III, HOYT Weeks in Treatment: 1 Vital Signs Time Taken: 10:40 Temperature (F): 98.6 Height (in): 72 Pulse (bpm): 75 Weight (lbs): 256 Respiratory Rate (breaths/min): 18 Body Mass Index (BMI): 34.7 Blood Pressure (mmHg): 169/84 Reference Range: 80 - 120 mg / dl Electronic Signature(s) Signed: 09/22/2019 4:30:52 PM By: Harold Barban Entered By: Harold Barban on 09/22/2019 10:44:28

## 2019-09-22 NOTE — Progress Notes (Addendum)
[name], SIGNS ([id number]) Visit Report for 09/22/2019 Chief Complaint Document Details Patient Name: [name], [name]. Date of Service: 09/22/2019 10:45 AM Medical Record Number: [id number] Patient Account Number: [id number] Date of Birth/Sex: 06-02-1946 (73 y.o. M) Treating RN: Montey Hora Primary Care Provider: Glendon Axe Other Clinician: Referring Provider: Glendon Axe Treating Provider/Extender: Melburn Hake, Jerrid Forgette Weeks in Treatment: 1 Information Obtained from: Patient Chief Complaint Left great toe ulcer Electronic Signature(s) Signed: 09/22/2019 10:54:29 AM By: Worthy Keeler PA-C Entered By: Worthy Keeler on 09/22/2019 10:54:28 [place], [name] ([id number]) -------------------------------------------------------------------------------- Debridement Details Patient Name: [name]. Date of Service: 09/22/2019 10:45 AM Medical Record Number: [id number] Patient Account Number: [id number] Date of Birth/Sex: September 26, 1946 (73 y.o. M) Treating RN: Montey Hora Primary Care Provider: Glendon Axe Other Clinician: Referring Provider: Glendon Axe Treating Provider/Extender: STONE III, Ariatna Jester Weeks in Treatment: 1 Debridement Performed for Wound #1 Left,Plantar Toe Great Assessment: Performed By: Physician STONE III, Tanylah Schnoebelen E., PA-C Debridement Type: Debridement Severity of Tissue Pre Fat layer exposed Debridement: Level of Consciousness (Pre- Awake and Alert procedure): Pre-procedure Verification/Time Yes - 11:00 Out Taken: Start Time: 11:00 Pain Control: Lidocaine 4% Topical Solution Total Area Debrided (L x W): 0.2 (cm) x 0.3 (cm) = 0.06 (cm) Tissue and other material Viable, Non-Viable, Callus, Slough, Subcutaneous, Slough debrided: Level: Skin/Subcutaneous Tissue Debridement Description: Excisional Instrument: Curette Bleeding: Minimum Hemostasis Achieved: Pressure End Time: 11:03 Procedural Pain: 0 Post Procedural  Pain: 0 Response to Treatment: Procedure was tolerated well Level of Consciousness Awake and Alert (Post-procedure): Post Debridement Measurements of Total Wound Length: (cm) 0.2 Width: (cm) 0.3 Depth: (cm) 0.2 Volume: (cm) 0.009 Character of Wound/Ulcer Post Debridement: Improved Severity of Tissue Post Debridement: Fat layer exposed Post Procedure Diagnosis Same as Pre-procedure Electronic Signature(s) Signed: 09/22/2019 4:44:47 PM By: Montey Hora Signed: 09/22/2019 5:02:54 PM By: Worthy Keeler PA-C Entered By: Montey Hora on 09/22/2019 11:03:52 [place], [name] ([id number]) -------------------------------------------------------------------------------- HPI Details Patient Name: [name] Date of Service: 09/22/2019 10:45 AM Medical Record Number: [id number] Patient Account Number: [id number] Date of Birth/Sex: 1946/05/04 (73 y.o. M) Treating RN: Montey Hora Primary Care Provider: Glendon Axe Other Clinician: Referring Provider: Glendon Axe Treating Provider/Extender: Melburn Hake, Randle Shatzer Weeks in Treatment: 1 History of Present Illness HPI Description: 12/12/18 on evaluation today patient presents as a referral from his endocrinologist regarding issues that he has been having with his left great toe. Subsequently he has been seeing Dr. Cleda Mccreedy and Dr. Cleda Mccreedy has been the breeding the wound and in fact this was debrided this morning. The patient had an appointment there prior to coming here. Subsequently he states that even though he's been going for regular debridement after office things really do not seem to be showing signs of improvement unfortunately. He states that he is having some discomfort but nothing too significant. No fevers, chills, nausea, or vomiting noted at this time. The patient does have a history of hypertension, neuropathy, diabetes mellitus type II, and a history of stroke. Currently he's been using bacitracin on the toe and  utilizing a Band-Aid. He tells me he's had this wound for two years. He cannot remember the last time he had an x-ray. 12/19/18 on evaluation today patient appears to be doing well in regard to his plantar toe ulcer. He's been tolerating the dressing changes without complication. Fortunately there does not appear to be signs of infection at this time which is good news. No fever chills noted. 01/19/19 has been one month since I last  saw the patient as he was out of town. With that being said on evaluation today it does appear that he is going to require sharp debridement and I do believe that the total contact cast would benefit him as far as being applied at this time. He is in agreement that plan. 01/23/19 on evaluation today patient appears to be doing better in regard to his left great toe ulcer. He has been shown signs of improvement week by week and although this is slow it does seem to be doing fairly well which is good news. Overall very pleased in this regard. 01/31/19 on evaluation today patient presents for follow-up concerning his great toe ulcer. He was actually supposed to be here yesterday and he did come however he ended up having to leave due to his blood sugar dropping he states he just did not eat enough lunch. Fortunately seems to be doing much better today which is excellent news. Fortunately there's no evidence of infection at this time which is also good news. No fevers, chills, nausea, or vomiting noted at this time. Overall I feel like he is making excellent progress. 02/06/19 on evaluation today patient actually appears to be doing very well in regard to his plantar toe ulcer. He's been tolerating the dressing changes without complication. Fortunately there is no sign of infection at this time. Overall been very pleased with how things seem to be progressing. No fevers, chills, nausea, or vomiting noted at this time. 02/13/19 on evaluation today patient actually appears to be  doing excellent in regard to his toe ulcer which is almost completely close. Overall very pleased with that. There does not appear to be any signs of infection which is good news. Upon close inspection it appears that he has just a very slight opening still noted at the central portion of the toe although this is minimal. 02/16/19 on evaluation today patient is seen for early follow-up due to the fact that he tells me while at home he actually got up to go answer the door without putting on the walking boot part of his cast and subsequently damaged the total contact cast. It therefore comes in to have this removed and potentially replaced. Fortunately other than it given him a little bit of pain its far as pushing in on some areas there doesn't appear to be any injury once the cast was removed. 03/13/19 on evaluation today patient unfortunately presents for follow-up concerning his great toe ulcer on the left. He was discharged on 02/16/19 with the wound healed at that point. He tells me this remain so for several weeks or least a couple weeks before reopening. Fortunately there does not appear to be any signs of infection at this time. Nonetheless for the past week at least he's had the wound reopened and states it is been trying to keep pressure off of this but he has not been using the offloading shoe we previously gave him. Fortunately there's no evidence of infection at this time. No fevers, chills, nausea, or vomiting noted at this time. [name], [name] ([id number]) 03/17/19 on evaluation today patient actually appears to be doing rather well in regard to his toe also. Fact this appears to be significantly smaller this week even compared to what I saw last week on evaluation. He seems to have done very well for the offloading shoe and overall I'm very pleased with the progress that is made. It may be that he doesn't even need the cast to  get this to heal if he offloads this  appropriately. 03/24/19 on evaluation today patient actually appears to be doing well in regard to his plantar foot ulcer. He's been tolerating the dressing changes without complication the collagen does seem to be beneficial for him. With that being said he is gonna require some miles sharp debridement today to clear away some of the callous's around the edge of the wound as well as the minimal Slough noted on the surface of the wound. 03/31/19 on evaluation today patient appears to be doing well in regard to his plantar great toe ulcer. He has been tolerating the dressing changes without complication which is good news. Fortunately there does not appear to be any signs of active infection at this time. Overall very pleased with how things seem to be progressing. 04/11/19 on evaluation today patient's tools are appears to be doing in my pinion roughly about the same as were things that previous. Fortunately there does not appear to be signs of active infection at this time which is good news. With that being said he has not been experiencing any pain at this time which is good news there is also No fevers, chills, nausea, or vomiting noted at this time. 04/18/19 on evaluation today patient's wound actually appears to be doing fairly well today he did not even really have a lot of callous buildup. We have been debating on whether or not to reinitiate total contact cast. With that being said he seems to be doing fairly well this point with offloading in my pinion and again I think we need to come up with a way to get this healed that will also be sustainable for keeping it close. Again we were able to close it with the cast previous but again he has to be able to keep it such. For that reason we are working on trying to get him diabetic shoes he's waiting for the [facility] to get in touch with the local company that has to measure him for these do not want to have a cast on at such a time as when they need  to actually measure him. 04/25/19 on evaluation today patient appears to be doing well in regard to his plantar foot specifically great toe ulcer. He's been tolerating the dressing changes without complication. Fortunately there's no signs of active infection at this time. Overall I'm very pleased with how things appear currently. 05/02/19 on evaluation today patient appears to actually be doing better in regard to his plantar foot ulcer. He shown signs of good improvement which is excellent news. She states he can walk better with the cast on the can with the offloading shoe that he had on previous. Fortunately there's no signs of active infection at this time. No fevers, chills, nausea, or vomiting noted at this time. 05/09/19 on evaluation today patient actually appears to be doing excellent. In fact he appears to be completely healed based on evaluation at this time. Fortunately there's no signs of infection and is having no discomfort. 06/08/19 this is a follow-up visit although the patient has been healed for almost a month but not quite. He tells me that in the past several days he noted some blood coming from his toe and he thought he should come in at this checked out. Fortunately he's having a pain. He also did get his custom orthotics shoes. With that being said he still appears to have developed this ulceration there is something he tells me that  the orthotic specialist stated they could do to the toes to try to help out in this regard although he did not know exactly what it was we may need to check with [facility]. 06/12/19 on evaluation today patient actually appears to be doing great in regard to his foot ulcer. The wound on his toes shown signs of excellent improvement which is great news. With that being said he is not completely close yet the cast has done wonders for him and he is definitely headed in the correct direction. No fevers, chills, nausea, or vomiting noted at  this time. 06/22/19 on evaluation today fortunately patient actually appears to be healing quite well and in fact appears to be completely healed this is excellent news. Fortunately there's no signs of active infection he's been tolerating the total contact cast without complication. Overall I'm pleased with the progress that has been made. He also has his new custom shoes he brought was with him today. 07/04/19 on evaluation today patient actually appears to be doing a little bit worse in regard his toe ulcer unfortunately this has yet again reopened. It has only been about a week and a half since I've last seen him I really did not expect this to reopen this quickly. Nonetheless I did discuss with him his normal routine in order to see what may be causing this. He has custom shoes. With that being said he apparently has not been using the custom shoes and even when he has used them he has not [name], [name]. ([id number]) even put the insert in there which is what was custom molded to him in order to appropriately offload high-pressure point areas. In fact his exact question to me was whether or not he needed to actually put those in they just came loose in the box and he had never installed them. Nonetheless it also came to light the he's been walking around in his bedroom slippers at home he tells me he does not go barefoot but he really has not been wearing his custom offloading/diabetic shoes. Nonetheless I do believe that this is why he continually reopens as far as the wound is concerned. I discussed this with the patient in great detail today for yet again although I previously had this discussion with him in the past. 07/11/19-Patient returns at 1 week for his left great toe wound which is actually doing really well, he has been using the insert to offload the toe and is very pleased with it. This looks like this is working out really well for him 07/18/19 on evaluation today patient  appears to be doing really about the same with regard to his ulcer on the right great toe. He's been tolerating the dressing changes without complication. Fortunately there's no signs of active infection at this time. With that being said I think that we're having a difficult time getting this to heal and I do believe that initiating treatment with the total contact cast to get this to close would be a good idea. The good news is he seems to be keeping this from worsening so hopefully that means that if we get it closed he can prevent this from reopening in the future. At this point my suggestion was that we go ahead and initiate treatment at both sites utilizing collagen. We will then subsequently put a piece of silver out and it between the toes of the right foot in the webbing between the fourth and fifth toe in order  to help keep things dry and allow this to hopefully close and we heal without complication. The patient is in agreement with plan. Subsequently I am hopeful that both wounds will heal quite quickly we have been very close now with the ankle and again the toe has reopened but fortunately doesn't appear to be to bad at this point. No antibiotics were prescribed today as they did not appear to be necessary. 07/25/19 on evaluation today patient actually appears to be doing excellent in regard to his great toe ulcer. In fact this really appears to be almost completely healed if indeed it is not in fact healed. With that being said there is still the eighth small pinpoint area that could be open although I tend to doubt it. Nonetheless it is something that I would like to continue to watch for more week I do think it would be beneficial for [place] to go ahead and continue with the cast for one more week before deciding to discharge him especially in light of the issues he's had in the past. Specifically with regard to the wound reopening rather quickly. 08/01/2019 upon evaluation today patient  actually appears to be doing well with regard to his great toe ulcer. He has been tolerating the dressing changes without complication. Fortunately we think appears to be completely healed at this time which is great news. I am very pleased with the progress up to this point. In fact he appears to be completely healed at this point. Readmission: 09/15/2019 patient presents today for reevaluation here in our clinic concerning issues that he is having with his left great toe as he did previous. We have not actually seen him since August 01, 2019. He had been doing very well until he noticed when walking into the bathroom with just the socks on the other night that he had a little bit of bleeding from the toe location. Subsequently he decided to make an appointment to come in as he did not want this to get significantly worse. There does not appear to be any signs of active infection he tells me as long as he was wearing his shoes he seemed to be doing okay is walking around not necessarily barefoot but just with socks on that I think may be his main problem at this time. 09/22/2019 on evaluation today patient actually appears to be doing quite well with regard to his great toe ulcer. This is making signs of good improvement even in the short amount of time we have been taking care of his toe since he came back. Is just been 1 week. Nonetheless the improvement in the wound size is excellent he also really has no depth and no evidence of infection which is great news. Electronic Signature(s) Signed: 09/22/2019 11:06:37 AM By: Worthy Keeler PA-C Entered By: Worthy Keeler on 09/22/2019 11:06:36 [place], [name] ([id number]) -------------------------------------------------------------------------------- Physical Exam Details Patient Name: [name] Date of Service: 09/22/2019 10:45 AM Medical Record Number: [id number] Patient Account Number: [id number] Date of Birth/Sex: 05-Dec-1946  (73 y.o. M) Treating RN: Montey Hora Primary Care Provider: Glendon Axe Other Clinician: Referring Provider: Glendon Axe Treating Provider/Extender: STONE III, Chloeanne Poteet Weeks in Treatment: 1 Constitutional Well-nourished and well-hydrated in no acute distress. Respiratory normal breathing without difficulty. Psychiatric this patient is able to make decisions and demonstrates good insight into disease process. Alert and Oriented x 3. pleasant and cooperative. Notes Patient's wound currently showed signs of good epithelialization and granulation. There was some slough  noted in the central portion of the wound that I did have to debride away post debridement it appears to be doing much better. Once debridement was completed I did go ahead and reapply the total contact cast today. Electronic Signature(s) Signed: 09/22/2019 11:07:31 AM By: Worthy Keeler PA-C Entered By: Worthy Keeler on 09/22/2019 11:07:31 [place], [name] ([id number]) -------------------------------------------------------------------------------- Physician Orders Details Patient Name: [name] Date of Service: 09/22/2019 10:45 AM Medical Record Number: [id number] Patient Account Number: [id number] Date of Birth/Sex: 02/06/1946 (73 y.o. M) Treating RN: Montey Hora Primary Care Provider: Glendon Axe Other Clinician: Referring Provider: Glendon Axe Treating Provider/Extender: Melburn Hake, Bijal Siglin Weeks in Treatment: 1 Verbal / Phone Orders: No Diagnosis Coding ICD-10 Coding Code Description E11.621 Type 2 diabetes mellitus with foot ulcer L97.522 Non-pressure chronic ulcer of other part of left foot with fat layer exposed I10 Essential (primary) hypertension E11.43 Type 2 diabetes mellitus with diabetic autonomic (poly)neuropathy Wound Cleansing Wound #1 Left,Plantar Toe Great o Clean wound with Normal Saline. o May shower with protection. - Please do not get your cast  wet Primary Wound Dressing Wound #1 Left,Plantar Toe Great o Silver Collagen Secondary Dressing Wound #1 Left,Plantar Toe Great o Foam Dressing Change Frequency Wound #1 Left,Plantar Toe Great o Change dressing every week Follow-up Appointments Wound #1 Left,Plantar Toe Great o Return Appointment in 1 week. Off-Loading Wound #1 Left,Plantar Toe Great o Total Contact Cast to Left Lower Extremity Electronic Signature(s) Signed: 09/22/2019 4:44:47 PM By: Montey Hora Signed: 09/22/2019 5:02:54 PM By: Worthy Keeler PA-C Entered By: Montey Hora on 09/22/2019 11:06:47 [place], [name] ([id number]) -------------------------------------------------------------------------------- Problem List Details Patient Name: JEKAI, BARBERI. Date of Service: 09/22/2019 10:45 AM Medical Record Number: [id number] Patient Account Number: [id number] Date of Birth/Sex: [birth year] (73 y.o. M) Treating RN: Montey Hora Primary Care Provider: Glendon Axe Other Clinician: Referring Provider: Glendon Axe Treating Provider/Extender: Melburn Hake, Naszir Cott Weeks in Treatment: 1 Active Problems ICD-10 Evaluated Encounter Code Description Active Date Today Diagnosis E11.621 Type 2 diabetes mellitus with foot ulcer 09/15/2019 No Yes L97.522 Non-pressure chronic ulcer of other part of left foot with fat 09/15/2019 No Yes layer exposed I10 Essential (primary) hypertension 09/15/2019 No Yes E11.43 Type 2 diabetes mellitus with diabetic autonomic (poly) 09/15/2019 No Yes neuropathy Inactive Problems Resolved Problems Electronic Signature(s) Signed: 09/22/2019 10:54:22 AM By: Worthy Keeler PA-C Entered By: Worthy Keeler on 09/22/2019 10:54:22 [name], [name] ([id number]) -------------------------------------------------------------------------------- Progress Note Details Patient Name: [name] Date of Service: 09/22/2019 10:45 AM Medical Record Number:  [id number] Patient Account Number: [id number] Date of Birth/Sex: [birth year] (73 y.o. M) Treating RN: Montey Hora Primary Care Provider: Glendon Axe Other Clinician: Referring Provider: Glendon Axe Treating Provider/Extender: Melburn Hake, Timya Trimmer Weeks in Treatment: 1 Subjective Chief Complaint Information obtained from Patient Left great toe ulcer History of Present Illness (HPI) 12/12/18 on evaluation today patient presents as a referral from his endocrinologist regarding issues that he has been having with his left great toe. Subsequently he has been seeing Dr. Cleda Mccreedy and Dr. Cleda Mccreedy has been the breeding the wound and in fact this was debrided this morning. The patient had an appointment there prior to coming here. Subsequently he states that even though he's been going for regular debridement after office things really do not seem to be showing signs of improvement unfortunately. He states that he is having some discomfort but nothing too significant. No fevers, chills, nausea, or vomiting noted at this time. The patient does have a  history of hypertension, neuropathy, diabetes mellitus type II, and a history of stroke. Currently he's been using bacitracin on the toe and utilizing a Band-Aid. He tells me he's had this wound for two years. He cannot remember the last time he had an x-ray. 12/19/18 on evaluation today patient appears to be doing well in regard to his plantar toe ulcer. He's been tolerating the dressing changes without complication. Fortunately there does not appear to be signs of infection at this time which is good news. No fever chills noted. 01/19/19 has been one month since I last saw the patient as he was out of town. With that being said on evaluation today it does appear that he is going to require sharp debridement and I do believe that the total contact cast would benefit him as far as being applied at this time. He is in agreement that plan. 01/23/19 on evaluation  today patient appears to be doing better in regard to his left great toe ulcer. He has been shown signs of improvement week by week and although this is slow it does seem to be doing fairly well which is good news. Overall very pleased in this regard. 01/31/19 on evaluation today patient presents for follow-up concerning his great toe ulcer. He was actually supposed to be here yesterday and he did come however he ended up having to leave due to his blood sugar dropping he states he just did not eat enough lunch. Fortunately seems to be doing much better today which is excellent news. Fortunately there's no evidence of infection at this time which is also good news. No fevers, chills, nausea, or vomiting noted at this time. Overall I feel like he is making excellent progress. 02/06/19 on evaluation today patient actually appears to be doing very well in regard to his plantar toe ulcer. He's been tolerating the dressing changes without complication. Fortunately there is no sign of infection at this time. Overall been very pleased with how things seem to be progressing. No fevers, chills, nausea, or vomiting noted at this time. 02/13/19 on evaluation today patient actually appears to be doing excellent in regard to his toe ulcer which is almost completely close. Overall very pleased with that. There does not appear to be any signs of infection which is good news. Upon close inspection it appears that he has just a very slight opening still noted at the central portion of the toe although this is minimal. 02/16/19 on evaluation today patient is seen for early follow-up due to the fact that he tells me while at home he actually got up to go answer the door without putting on the walking boot part of his cast and subsequently damaged the total contact cast. It therefore comes in to have this removed and potentially replaced. Fortunately other than it given him a little bit of pain its far as pushing in on  some areas there doesn't appear to be any injury once the cast was removed. [name], [name] ([id number]) 03/13/19 on evaluation today patient unfortunately presents for follow-up concerning his great toe ulcer on the left. He was discharged on 02/16/19 with the wound healed at that point. He tells me this remain so for several weeks or least a couple weeks before reopening. Fortunately there does not appear to be any signs of infection at this time. Nonetheless for the past week at least he's had the wound reopened and states it is been trying to keep pressure off of this but he  has not been using the offloading shoe we previously gave him. Fortunately there's no evidence of infection at this time. No fevers, chills, nausea, or vomiting noted at this time. 03/17/19 on evaluation today patient actually appears to be doing rather well in regard to his toe also. Fact this appears to be significantly smaller this week even compared to what I saw last week on evaluation. He seems to have done very well for the offloading shoe and overall I'm very pleased with the progress that is made. It may be that he doesn't even need the cast to get this to heal if he offloads this appropriately. 03/24/19 on evaluation today patient actually appears to be doing well in regard to his plantar foot ulcer. He's been tolerating the dressing changes without complication the collagen does seem to be beneficial for him. With that being said he is gonna require some miles sharp debridement today to clear away some of the callous's around the edge of the wound as well as the minimal Slough noted on the surface of the wound. 03/31/19 on evaluation today patient appears to be doing well in regard to his plantar great toe ulcer. He has been tolerating the dressing changes without complication which is good news. Fortunately there does not appear to be any signs of active infection at this time. Overall very pleased with how  things seem to be progressing. 04/11/19 on evaluation today patient's tools are appears to be doing in my pinion roughly about the same as were things that previous. Fortunately there does not appear to be signs of active infection at this time which is good news. With that being said he has not been experiencing any pain at this time which is good news there is also No fevers, chills, nausea, or vomiting noted at this time. 04/18/19 on evaluation today patient's wound actually appears to be doing fairly well today he did not even really have a lot of callous buildup. We have been debating on whether or not to reinitiate total contact cast. With that being said he seems to be doing fairly well this point with offloading in my pinion and again I think we need to come up with a way to get this healed that will also be sustainable for keeping it close. Again we were able to close it with the cast previous but again he has to be able to keep it such. For that reason we are working on trying to get him diabetic shoes he's waiting for the [employer] to get in touch with the local company that has to measure him for these do not want to have a cast on at such a time as when they need to actually measure him. 04/25/19 on evaluation today patient appears to be doing well in regard to his plantar foot specifically great toe ulcer. He's been tolerating the dressing changes without complication. Fortunately there's no signs of active infection at this time. Overall I'm very pleased with how things appear currently. 05/02/19 on evaluation today patient appears to actually be doing better in regard to his plantar foot ulcer. He shown signs of good improvement which is excellent news. She states he can walk better with the cast on the can with the offloading shoe that he had on previous. Fortunately there's no signs of active infection at this time. No fevers, chills, nausea, or vomiting noted at this time. 05/09/19 on  evaluation today patient actually appears to be doing excellent. In fact he appears  to be completely healed based on evaluation at this time. Fortunately there's no signs of infection and is having no discomfort. 06/08/19 this is a follow-up visit although the patient has been healed for almost a month but not quite. He tells me that in the past several days he noted some blood coming from his toe and he thought he should come in at this checked out. Fortunately he's having a pain. He also did get his custom orthotics shoes. With that being said he still appears to have developed this ulceration there is something he tells me that the orthotic specialist stated they could do to the toes to try to help out in this regard although he did not know exactly what it was we may need to check with [facility]. 06/12/19 on evaluation today patient actually appears to be doing great in regard to his foot ulcer. The wound on his toes shown signs of excellent improvement which is great news. With that being said he is not completely close yet the cast has done wonders for him and he is definitely headed in the correct direction. No fevers, chills, nausea, or vomiting noted at this time. 06/22/19 on evaluation today fortunately patient actually appears to be healing quite well and in fact appears to be completely healed this is excellent news. Fortunately there's no signs of active infection he's been tolerating the total contact cast without complication. Overall I'm pleased with the progress that has been made. He also has his new custom shoes he brought was [name], [name] ([id number]) with him today. 07/04/19 on evaluation today patient actually appears to be doing a little bit worse in regard his toe ulcer unfortunately this has yet again reopened. It has only been about a week and a half since I've last seen him I really did not expect this to reopen this quickly. Nonetheless I did discuss with him his  normal routine in order to see what may be causing this. He has custom shoes. With that being said he apparently has not been using the custom shoes and even when he has used them he has not even put the insert in there which is what was custom molded to him in order to appropriately offload high-pressure point areas. In fact his exact question to me was whether or not he needed to actually put those in they just came loose in the box and he had never installed them. Nonetheless it also came to light the he's been walking around in his bedroom slippers at home he tells me he does not go barefoot but he really has not been wearing his custom offloading/diabetic shoes. Nonetheless I do believe that this is why he continually reopens as far as the wound is concerned. I discussed this with the patient in great detail today for yet again although I previously had this discussion with him in the past. 07/11/19-Patient returns at 1 week for his left great toe wound which is actually doing really well, he has been using the insert to offload the toe and is very pleased with it. This looks like this is working out really well for him 07/18/19 on evaluation today patient appears to be doing really about the same with regard to his ulcer on the right great toe. He's been tolerating the dressing changes without complication. Fortunately there's no signs of active infection at this time. With that being said I think that we're having a difficult time getting this to heal and I  do believe that initiating treatment with the total contact cast to get this to close would be a good idea. The good news is he seems to be keeping this from worsening so hopefully that means that if we get it closed he can prevent this from reopening in the future. At this point my suggestion was that we go ahead and initiate treatment at both sites utilizing collagen. We will then subsequently put a piece of silver out and it between the  toes of the right foot in the webbing between the fourth and fifth toe in order to help keep things dry and allow this to hopefully close and we heal without complication. The patient is in agreement with plan. Subsequently I am hopeful that both wounds will heal quite quickly we have been very close now with the ankle and again the toe has reopened but fortunately doesn't appear to be to bad at this point. No antibiotics were prescribed today as they did not appear to be necessary. 07/25/19 on evaluation today patient actually appears to be doing excellent in regard to his great toe ulcer. In fact this really appears to be almost completely healed if indeed it is not in fact healed. With that being said there is still the eighth small pinpoint area that could be open although I tend to doubt it. Nonetheless it is something that I would like to continue to watch for more week I do think it would be beneficial for [place] to go ahead and continue with the cast for one more week before deciding to discharge him especially in light of the issues he's had in the past. Specifically with regard to the wound reopening rather quickly. 08/01/2019 upon evaluation today patient actually appears to be doing well with regard to his great toe ulcer. He has been tolerating the dressing changes without complication. Fortunately we think appears to be completely healed at this time which is great news. I am very pleased with the progress up to this point. In fact he appears to be completely healed at this point. Readmission: 09/15/2019 patient presents today for reevaluation here in our clinic concerning issues that he is having with his left great toe as he did previous. We have not actually seen him since August 01, 2019. He had been doing very well until he noticed when walking into the bathroom with just the socks on the other night that he had a little bit of bleeding from the toe location. Subsequently he decided to  make an appointment to come in as he did not want this to get significantly worse. There does not appear to be any signs of active infection he tells me as long as he was wearing his shoes he seemed to be doing okay is walking around not necessarily barefoot but just with socks on that I think may be his main problem at this time. 09/22/2019 on evaluation today patient actually appears to be doing quite well with regard to his great toe ulcer. This is making signs of good improvement even in the short amount of time we have been taking care of his toe since he came back. Is just been 1 week. Nonetheless the improvement in the wound size is excellent he also really has no depth and no evidence of infection which is great news. Patient History Information obtained from Patient. Family History Diabetes - Mother, Heart Disease - Father, Stroke - Father, No family history of Cancer, Hereditary Spherocytosis, Hypertension, Kidney Disease, Lung  Disease, Seizures, Thyroid Problems, Tuberculosis. [name], [name] ([id number]) Social History Never smoker, Marital Status - Widowed, Alcohol Use - Never, Drug Use - No History, Caffeine Use - Moderate. Medical History Eyes Denies history of Cataracts, Glaucoma, Optic Neuritis Ear/Nose/Mouth/Throat Denies history of Chronic sinus problems/congestion, Middle ear problems Hematologic/Lymphatic Denies history of Anemia, Hemophilia, Human Immunodeficiency Virus, Lymphedema Respiratory Denies history of Aspiration, Asthma, Chronic Obstructive Pulmonary Disease (COPD), Pneumothorax, Sleep Apnea, Tuberculosis Cardiovascular Patient has history of Hypertension Denies history of Angina, Arrhythmia, Congestive Heart Failure, Coronary Artery Disease, Deep Vein Thrombosis, Hypotension, Myocardial Infarction, Peripheral Arterial Disease, Peripheral Venous Disease, Phlebitis, Vasculitis Gastrointestinal Denies history of Cirrhosis , Colitis, Crohn s,  Hepatitis A, Hepatitis B, Hepatitis C Endocrine Patient has history of Type II Diabetes Denies history of Type I Diabetes Genitourinary Denies history of End Stage Renal Disease Immunological Denies history of Lupus Erythematosus, Raynaud s, Scleroderma Integumentary (Skin) Denies history of History of Burn, History of pressure wounds Musculoskeletal Denies history of Gout, Rheumatoid Arthritis, Osteoarthritis, Osteomyelitis Neurologic Denies history of Dementia, Neuropathy, Quadriplegia, Paraplegia, Seizure Disorder Oncologic Denies history of Received Chemotherapy, Received Radiation Review of Systems (ROS) Constitutional Symptoms (General Health) Denies complaints or symptoms of Fatigue, Fever, Chills, Marked Weight Change. Respiratory Denies complaints or symptoms of Chronic or frequent coughs, Shortness of Breath. Cardiovascular Denies complaints or symptoms of Chest pain, LE edema. Psychiatric Denies complaints or symptoms of Anxiety, Claustrophobia. Objective Constitutional Well-nourished and well-hydrated in no acute distress. ALMOND, FRIES ([id number]) Vitals Time Taken: 10:40 AM, Height: 72 in, Weight: 256 lbs, BMI: 34.7, Temperature: 98.6 F, Pulse: 75 bpm, Respiratory Rate: 18 breaths/min, Blood Pressure: 169/84 mmHg. Respiratory normal breathing without difficulty. Psychiatric this patient is able to make decisions and demonstrates good insight into disease process. Alert and Oriented x 3. pleasant and cooperative. General Notes: Patient's wound currently showed signs of good epithelialization and granulation. There was some slough noted in the central portion of the wound that I did have to debride away post debridement it appears to be doing much better. Once debridement was completed I did go ahead and reapply the total contact cast today. Integumentary (Hair, Skin) Wound #1 status is Open. Original cause of wound was Gradually Appeared. The wound is  located on the [employer]. The wound measures 0.2cm length x 0.3cm width x 0.1cm depth; 0.047cm^2 area and 0.005cm^3 volume. There is no tunneling or undermining noted. There is a medium amount of serous drainage noted. The wound margin is flat and intact. There is no granulation within the wound bed. There is a large (67-100%) amount of necrotic tissue within the wound bed including Eschar. Assessment Active Problems ICD-10 Type 2 diabetes mellitus with foot ulcer Non-pressure chronic ulcer of other part of left foot with fat layer exposed Essential (primary) hypertension Type 2 diabetes mellitus with diabetic autonomic (poly)neuropathy Procedures Wound #1 Pre-procedure diagnosis of Wound #1 is a Diabetic Wound/Ulcer of the Lower Extremity located on the Left,Plantar Toe Great .Severity of Tissue Pre Debridement is: Fat layer exposed. There was a Excisional Skin/Subcutaneous Tissue Debridement with a total area of 0.06 sq cm performed by STONE III, Hoa Deriso E., PA-C. With the following instrument(s): Curette to remove Viable and Non-Viable tissue/material. Material removed includes Callus, Subcutaneous Tissue, and Slough after achieving pain control using Lidocaine 4% Topical Solution. No specimens were taken. A time out was conducted at 11:00, prior to the start of the procedure. A Minimum amount of bleeding was controlled with Pressure. The procedure was tolerated well  with a pain level of 0 throughout and a pain level of 0 following the procedure. Post Debridement Measurements: 0.2cm length x 0.3cm width x 0.2cm depth; 0.009cm^3 volume. Character of Wound/Ulcer Post Debridement is improved. Severity of Tissue Post Debridement is: Fat layer exposed. Post procedure Diagnosis Wound #1: Same as Pre-Procedure [name], [name] ([id number]) Plan Wound Cleansing: Wound #1 Left,Plantar Toe Great: Clean wound with Normal Saline. May shower with protection. - Please do not get  your cast wet Primary Wound Dressing: Wound #1 Left,Plantar Toe Great: Silver Collagen Secondary Dressing: Wound #1 Left,Plantar Toe Great: Foam Dressing Change Frequency: Wound #1 Left,Plantar Toe Great: Change dressing every week Follow-up Appointments: Wound #1 Left,Plantar Toe Great: Return Appointment in 1 week. Off-Loading: Wound #1 Left,Plantar Toe Great: Total Contact Cast to Left Lower Extremity 1. I would recommend that we go ahead and initiate treatment with the total contact cast to be continued he is done well just over the past week hopefully this will heal quite rapidly. 2. I am also going to suggest that we go ahead and continue with the silver collagen dressing as that seems to be doing an excellent job for him. We will see patient back for reevaluation in 1 week here in the clinic. If anything worsens or changes patient will contact our office for additional recommendations. Electronic Signature(s) Signed: 09/22/2019 11:08:16 AM By: Worthy Keeler PA-C Entered By: Worthy Keeler on 09/22/2019 11:08:16 [place], [name] ([id number]) -------------------------------------------------------------------------------- ROS/PFSH Details Patient Name: [name] Date of Service: 09/22/2019 10:45 AM Medical Record Number: [id number] Patient Account Number: [id number] Date of Birth/Sex: [birth year] (73 y.o. M) Treating RN: Montey Hora Primary Care Provider: Glendon Axe Other Clinician: Referring Provider: Glendon Axe Treating Provider/Extender: STONE III, Ilya Ess Weeks in Treatment: 1 Information Obtained From Patient Constitutional Symptoms (General Health) Complaints and Symptoms: Negative for: Fatigue; Fever; Chills; Marked Weight Change Respiratory Complaints and Symptoms: Negative for: Chronic or frequent coughs; Shortness of Breath Medical History: Negative for: Aspiration; Asthma; Chronic Obstructive Pulmonary Disease (COPD);  Pneumothorax; Sleep Apnea; Tuberculosis Cardiovascular Complaints and Symptoms: Negative for: Chest pain; LE edema Medical History: Positive for: Hypertension Negative for: Angina; Arrhythmia; Congestive Heart Failure; Coronary Artery Disease; Deep Vein Thrombosis; Hypotension; Myocardial Infarction; Peripheral Arterial Disease; Peripheral Venous Disease; Phlebitis; Vasculitis Psychiatric Complaints and Symptoms: Negative for: Anxiety; Claustrophobia Eyes Medical History: Negative for: Cataracts; Glaucoma; Optic Neuritis Ear/Nose/Mouth/Throat Medical History: Negative for: Chronic sinus problems/congestion; Middle ear problems Hematologic/Lymphatic Medical History: Negative for: Anemia; Hemophilia; Human Immunodeficiency Virus; Lymphedema Gastrointestinal [name], [name] ([id number]) Medical History: Negative for: Cirrhosis ; Colitis; Crohnos; Hepatitis A; Hepatitis B; Hepatitis C Endocrine Medical History: Positive for: Type II Diabetes Negative for: Type I Diabetes Time with diabetes: 1990 Treated with: Insulin Blood sugar tested every day: Yes Tested : Genitourinary Medical History: Negative for: End Stage Renal Disease Immunological Medical History: Negative for: Lupus Erythematosus; Raynaudos; Scleroderma Integumentary (Skin) Medical History: Negative for: History of Burn; History of pressure wounds Musculoskeletal Medical History: Negative for: Gout; Rheumatoid Arthritis; Osteoarthritis; Osteomyelitis Neurologic Medical History: Negative for: Dementia; Neuropathy; Quadriplegia; Paraplegia; Seizure Disorder Oncologic Medical History: Negative for: Received Chemotherapy; Received Radiation Immunizations Pneumococcal Vaccine: Received Pneumococcal Vaccination: Yes Implantable Devices None Family and Social History Cancer: No; Diabetes: Yes - Mother; Heart Disease: Yes - Father; Hereditary Spherocytosis: No; Hypertension: No; Kidney Disease: No; Lung  Disease: No; Seizures: No; Stroke: Yes - Father; Thyroid Problems: No; Tuberculosis: No; Never smoker; Marital Status - Widowed; Alcohol Use: Never; Drug Use: No History; Caffeine  Use: Moderate; Financial Concerns: No; Food, Clothing or Shelter Needs: No; Support System Lacking: No; Transportation Concerns: No Physician Affirmation I have reviewed and agree with the above information. [name], [name] ([id number]) Electronic Signature(s) Signed: 09/22/2019 4:44:47 PM By: Montey Hora Signed: 09/22/2019 5:02:54 PM By: Worthy Keeler PA-C Entered By: Worthy Keeler on 09/22/2019 11:06:51 [place], [name] ([id number]) -------------------------------------------------------------------------------- SuperBill Details Patient Name: [name] Date of Service: 09/22/2019 Medical Record Number: [id number] Patient Account Number: [id number] Date of Birth/Sex: 1946-11-17 (73 y.o. M) Treating RN: Montey Hora Primary Care Provider: Glendon Axe Other Clinician: Referring Provider: Glendon Axe Treating Provider/Extender: Melburn Hake, Idona Stach Weeks in Treatment: 1 Diagnosis Coding ICD-10 Codes Code Description E11.621 Type 2 diabetes mellitus with foot ulcer L97.522 Non-pressure chronic ulcer of other part of left foot with fat layer exposed I10 Essential (primary) hypertension E11.43 Type 2 diabetes mellitus with diabetic autonomic (poly)neuropathy Facility Procedures CPT4 Code: [id number] Description: [id number] - DEB SUBQ TISSUE 20 SQ CM/< ICD-10 Diagnosis Description L97.522 Non-pressure chronic ulcer of other part of left foot with fat Modifier: layer exposed Quantity: 1 Physician Procedures CPT4 Code: [id number] Description: 11042 - WC PHYS SUBQ TISS 20 SQ CM ICD-10 Diagnosis Description L97.522 Non-pressure chronic ulcer of other part of left foot with fat Modifier: layer exposed Quantity: 1 Electronic Signature(s) Signed: 09/22/2019 11:08:32 AM By: Worthy Keeler  PA-C Entered By: Worthy Keeler on 09/22/2019 11:08:32

## 2019-09-29 ENCOUNTER — Other Ambulatory Visit: Payer: Self-pay

## 2019-09-29 ENCOUNTER — Encounter: Payer: Medicare Other | Attending: Physician Assistant | Admitting: Physician Assistant

## 2019-09-29 DIAGNOSIS — E1142 Type 2 diabetes mellitus with diabetic polyneuropathy: Secondary | ICD-10-CM | POA: Diagnosis not present

## 2019-09-29 DIAGNOSIS — Z8249 Family history of ischemic heart disease and other diseases of the circulatory system: Secondary | ICD-10-CM | POA: Diagnosis not present

## 2019-09-29 DIAGNOSIS — Z09 Encounter for follow-up examination after completed treatment for conditions other than malignant neoplasm: Secondary | ICD-10-CM | POA: Diagnosis not present

## 2019-09-29 DIAGNOSIS — I10 Essential (primary) hypertension: Secondary | ICD-10-CM | POA: Insufficient documentation

## 2019-09-29 DIAGNOSIS — L97522 Non-pressure chronic ulcer of other part of left foot with fat layer exposed: Secondary | ICD-10-CM | POA: Insufficient documentation

## 2019-09-29 DIAGNOSIS — E11621 Type 2 diabetes mellitus with foot ulcer: Secondary | ICD-10-CM | POA: Diagnosis present

## 2019-09-29 DIAGNOSIS — Z8673 Personal history of transient ischemic attack (TIA), and cerebral infarction without residual deficits: Secondary | ICD-10-CM | POA: Diagnosis not present

## 2019-09-29 NOTE — Progress Notes (Addendum)
Fred, [name] ([id number]) Visit Report for 09/29/2019 Chief Complaint Document Details Patient Name: [name], [name]. Date of Service: 09/29/2019 10:45 AM Medical Record Number: [id number] Patient Account Number: [id number] Date of Birth/Sex: [birth year] (73 y.o. M) Treating RN: Montey Hora Primary Care Provider: Glendon Axe Other Clinician: Referring Provider: Glendon Axe Treating Provider/Extender: Melburn Hake, Azell Bill Weeks in Treatment: 2 Information Obtained from: Patient Chief Complaint Left great toe ulcer Electronic Signature(s) Signed: 09/29/2019 10:44:05 AM By: Worthy Keeler PA-C Entered By: Worthy Keeler on 09/29/2019 10:44:04 Demilio, [name] ([id number]) -------------------------------------------------------------------------------- Debridement Details Patient Name: [name]. Date of Service: 09/29/2019 10:45 AM Medical Record Number: [id number] Patient Account Number: [id number] Date of Birth/Sex: 03-25-1946 (73 y.o. M) Treating RN: Montey Hora Primary Care Provider: Glendon Axe Other Clinician: Referring Provider: Glendon Axe Treating Provider/Extender: STONE III, Jacolyn Joaquin Weeks in Treatment: 2 Debridement Performed for Wound #1 Left,Plantar Toe Great Assessment: Performed By: Physician STONE III, Aften Lipsey E., PA-C Debridement Type: Debridement Severity of Tissue Pre Fat layer exposed Debridement: Level of Consciousness (Pre- Awake and Alert procedure): Pre-procedure Verification/Time Yes - 11:13 Out Taken: Start Time: 11:13 Pain Control: Lidocaine 4% Topical Solution Total Area Debrided (L x W): 0.1 (cm) x 0.1 (cm) = 0.01 (cm) Tissue and other material Non-Viable, Callus debrided: Level: Non-Viable Tissue Debridement Description: Selective/Open Wound Instrument: Curette Bleeding: None End Time: 11:16 Procedural Pain: 0 Post Procedural Pain: 0 Response to Treatment: Procedure was tolerated well Level of  Consciousness Awake and Alert (Post-procedure): Post Debridement Measurements of Total Wound Length: (cm) 0.1 Width: (cm) 0.1 Depth: (cm) 0.2 Volume: (cm) 0.002 Character of Wound/Ulcer Post Debridement: Improved Severity of Tissue Post Debridement: Fat layer exposed Post Procedure Diagnosis Same as Pre-procedure Electronic Signature(s) Signed: 09/29/2019 4:32:36 PM By: Worthy Keeler PA-C Signed: 09/29/2019 4:39:44 PM By: Montey Hora Entered By: Montey Hora on 09/29/2019 11:16:05 [name] ([id number]) -------------------------------------------------------------------------------- HPI Details Patient Name: [name] Date of Service: 09/29/2019 10:45 AM Medical Record Number: [id number] Patient Account Number: [id number] Date of Birth/Sex: [birth year] (73 y.o. M) Treating RN: Montey Hora Primary Care Provider: Glendon Axe Other Clinician: Referring Provider: Glendon Axe Treating Provider/Extender: Melburn Hake, Jaidynn Balster Weeks in Treatment: 2 History of Present Illness HPI Description: 12/12/18 on evaluation today patient presents as a referral from his endocrinologist regarding issues that he has been having with his left great toe. Subsequently he has been seeing Dr. Cleda Mccreedy and Dr. Cleda Mccreedy has been the breeding the wound and in fact this was debrided this morning. The patient had an appointment there prior to coming here. Subsequently he states that even though he's been going for regular debridement after office things really do not seem to be showing signs of improvement unfortunately. He states that he is having some discomfort but nothing too significant. No fevers, chills, nausea, or vomiting noted at this time. The patient does have a history of hypertension, neuropathy, diabetes mellitus type II, and a history of stroke. Currently he's been using bacitracin on the toe and utilizing a Band-Aid. He tells me he's had this wound for two years. He  cannot remember the last time he had an x-ray. 12/19/18 on evaluation today patient appears to be doing well in regard to his plantar toe ulcer. He's been tolerating the dressing changes without complication. Fortunately there does not appear to be signs of infection at this time which is good news. No fever chills noted. 01/19/19 has been one month since I last saw the patient as he was  out of town. With that being said on evaluation today it does appear that he is going to require sharp debridement and I do believe that the total contact cast would benefit him as far as being applied at this time. He is in agreement that plan. 01/23/19 on evaluation today patient appears to be doing better in regard to his left great toe ulcer. He has been shown signs of improvement week by week and although this is slow it does seem to be doing fairly well which is good news. Overall very pleased in this regard. 01/31/19 on evaluation today patient presents for follow-up concerning his great toe ulcer. He was actually supposed to be here yesterday and he did come however he ended up having to leave due to his blood sugar dropping he states he just did not eat enough lunch. Fortunately seems to be doing much better today which is excellent news. Fortunately there's no evidence of infection at this time which is also good news. No fevers, chills, nausea, or vomiting noted at this time. Overall I feel like he is making excellent progress. 02/06/19 on evaluation today patient actually appears to be doing very well in regard to his plantar toe ulcer. He's been tolerating the dressing changes without complication. Fortunately there is no sign of infection at this time. Overall been very pleased with how things seem to be progressing. No fevers, chills, nausea, or vomiting noted at this time. 02/13/19 on evaluation today patient actually appears to be doing excellent in regard to his toe ulcer which is almost completely  close. Overall very pleased with that. There does not appear to be any signs of infection which is good news. Upon close inspection it appears that he has just a very slight opening still noted at the central portion of the toe although this is minimal. 02/16/19 on evaluation today patient is seen for early follow-up due to the fact that he tells me while at home he actually got up to go answer the door without putting on the walking boot part of his cast and subsequently damaged the total contact cast. It therefore comes in to have this removed and potentially replaced. Fortunately other than it given him a little bit of pain its far as pushing in on some areas there doesn't appear to be any injury once the cast was removed. 03/13/19 on evaluation today patient unfortunately presents for follow-up concerning his great toe ulcer on the left. He was discharged on 02/16/19 with the wound healed at that point. He tells me this remain so for several weeks or least a couple weeks before reopening. Fortunately there does not appear to be any signs of infection at this time. Nonetheless for the past week at least he's had the wound reopened and states it is been trying to keep pressure off of this but he has not been using the offloading shoe we previously gave him. Fortunately there's no evidence of infection at this time. No fevers, chills, nausea, or vomiting noted at this time. ZOEL, BASSANI ([id number]) 03/17/19 on evaluation today patient actually appears to be doing rather well in regard to his toe also. Fact this appears to be significantly smaller this week even compared to what I saw last week on evaluation. He seems to have done very well for the offloading shoe and overall I'm very pleased with the progress that is made. It may be that he doesn't even need the cast to get this to heal if he  offloads this appropriately. 03/24/19 on evaluation today patient actually appears to be doing well  in regard to his plantar foot ulcer. He's been tolerating the dressing changes without complication the collagen does seem to be beneficial for him. With that being said he is gonna require some miles sharp debridement today to clear away some of the callous's around the edge of the wound as well as the minimal [place] noted on the surface of the wound. 03/31/19 on evaluation today patient appears to be doing well in regard to his plantar great toe ulcer. He has been tolerating the dressing changes without complication which is good news. Fortunately there does not appear to be any signs of active infection at this time. Overall very pleased with how things seem to be progressing. 04/11/19 on evaluation today patient's tools are appears to be doing in my pinion roughly about the same as were things that previous. Fortunately there does not appear to be signs of active infection at this time which is good news. With that being said he has not been experiencing any pain at this time which is good news there is also No fevers, chills, nausea, or vomiting noted at this time. 04/18/19 on evaluation today patient's wound actually appears to be doing fairly well today he did not even really have a lot of callous buildup. We have been debating on whether or not to reinitiate total contact cast. With that being said he seems to be doing fairly well this point with offloading in my pinion and again I think we need to come up with a way to get this healed that will also be sustainable for keeping it close. Again we were able to close it with the cast previous but again he has to be able to keep it such. For that reason we are working on trying to get him diabetic shoes he's waiting for the [place] to get in touch with the local company that has to measure him for these do not want to have a cast on at such a time as when they need to actually measure him. 04/25/19 on evaluation today patient appears to be doing well in  regard to his plantar foot specifically great toe ulcer. He's been tolerating the dressing changes without complication. Fortunately there's no signs of active infection at this time. Overall I'm very pleased with how things appear currently. 05/02/19 on evaluation today patient appears to actually be doing better in regard to his plantar foot ulcer. He shown signs of good improvement which is excellent news. She states he can walk better with the cast on the can with the offloading shoe that he had on previous. Fortunately there's no signs of active infection at this time. No fevers, chills, nausea, or vomiting noted at this time. 05/09/19 on evaluation today patient actually appears to be doing excellent. In fact he appears to be completely healed based on evaluation at this time. Fortunately there's no signs of infection and is having no discomfort. 06/08/19 this is a follow-up visit although the patient has been healed for almost a month but not quite. He tells me that in the past several days he noted some blood coming from his toe and he thought he should come in at this checked out. Fortunately he's having a pain. He also did get his custom orthotics shoes. With that being said he still appears to have developed this ulceration there is something he tells me that the orthotic specialist stated they could  do to the toes to try to help out in this regard although he did not know exactly what it was we may need to check with [facility]. 06/12/19 on evaluation today patient actually appears to be doing great in regard to his foot ulcer. The wound on his toes shown signs of excellent improvement which is great news. With that being said he is not completely close yet the cast has done wonders for him and he is definitely headed in the correct direction. No fevers, chills, nausea, or vomiting noted at this time. 06/22/19 on evaluation today fortunately patient actually appears to be healing quite well  and in fact appears to be completely healed this is excellent news. Fortunately there's no signs of active infection he's been tolerating the total contact cast without complication. Overall I'm pleased with the progress that has been made. He also has his new custom shoes he brought was with him today. 07/04/19 on evaluation today patient actually appears to be doing a little bit worse in regard his toe ulcer unfortunately this has yet again reopened. It has only been about a week and a half since I've last seen him I really did not expect this to reopen this quickly. Nonetheless I did discuss with him his normal routine in order to see what may be causing this. He has custom shoes. With that being said he apparently has not been using the custom shoes and even when he has used them he has not OLUWADEMILADE, RICCITELLI. ([id number]) even put the insert in there which is what was custom molded to him in order to appropriately offload high-pressure point areas. In fact his exact question to me was whether or not he needed to actually put those in they just came loose in the box and he had never installed them. Nonetheless it also came to light the he's been walking around in his bedroom slippers at home he tells me he does not go barefoot but he really has not been wearing his custom offloading/diabetic shoes. Nonetheless I do believe that this is why he continually reopens as far as the wound is concerned. I discussed this with the patient in great detail today for yet again although I previously had this discussion with him in the past. 07/11/19-Patient returns at 1 week for his left great toe wound which is actually doing really well, he has been using the insert to offload the toe and is very pleased with it. This looks like this is working out really well for him 07/18/19 on evaluation today patient appears to be doing really about the same with regard to his ulcer on the right great toe. He's been  tolerating the dressing changes without complication. Fortunately there's no signs of active infection at this time. With that being said I think that we're having a difficult time getting this to heal and I do believe that initiating treatment with the total contact cast to get this to close would be a good idea. The good news is he seems to be keeping this from worsening so hopefully that means that if we get it closed he can prevent this from reopening in the future. At this point my suggestion was that we go ahead and initiate treatment at both sites utilizing collagen. We will then subsequently put a piece of silver out and it between the toes of the right foot in the webbing between the fourth and fifth toe in order to help keep things dry and  allow this to hopefully close and we heal without complication. The patient is in agreement with plan. Subsequently I am hopeful that both wounds will heal quite quickly we have been very close now with the ankle and again the toe has reopened but fortunately doesn't appear to be to bad at this point. No antibiotics were prescribed today as they did not appear to be necessary. 07/25/19 on evaluation today patient actually appears to be doing excellent in regard to his great toe ulcer. In fact this really appears to be almost completely healed if indeed it is not in fact healed. With that being said there is still the eighth small pinpoint area that could be open although I tend to doubt it. Nonetheless it is something that I would like to continue to watch for more week I do think it would be beneficial for [place] to go ahead and continue with the cast for one more week before deciding to discharge him especially in light of the issues he's had in the past. Specifically with regard to the wound reopening rather quickly. 08/01/2019 upon evaluation today patient actually appears to be doing well with regard to his great toe ulcer. He has been tolerating the  dressing changes without complication. Fortunately we think appears to be completely healed at this time which is great news. I am very pleased with the progress up to this point. In fact he appears to be completely healed at this point. Readmission: 09/15/2019 patient presents today for reevaluation here in our clinic concerning issues that he is having with his left great toe as he did previous. We have not actually seen him since August 01, 2019. He had been doing very well until he noticed when walking into the bathroom with just the socks on the other night that he had a little bit of bleeding from the toe location. Subsequently he decided to make an appointment to come in as he did not want this to get significantly worse. There does not appear to be any signs of active infection he tells me as long as he was wearing his shoes he seemed to be doing okay is walking around not necessarily barefoot but just with socks on that I think may be his main problem at this time. 09/22/2019 on evaluation today patient actually appears to be doing quite well with regard to his great toe ulcer. This is making signs of good improvement even in the short amount of time we have been taking care of his toe since he came back. Is just been 1 week. Nonetheless the improvement in the wound size is excellent he also really has no depth and no evidence of infection which is great news. 09/29/2019 on evaluation today patient appears to be doing well with regard to his toe ulcer. This is showing signs of improvement in fact it was questioning whether or not today this actually was healed. However there did appear to be some callus covering the surface of the wound was removed there did appear to be a small pinpoint opening still in the middle of the wound although this is minimal. Electronic Signature(s) Signed: 09/29/2019 1:13:08 PM By: Worthy Keeler PA-C Entered By: Worthy Keeler on 09/29/2019 13:13:08 [place],  [name] ([id number]) -------------------------------------------------------------------------------- Physical Exam Details Patient Name: [name] Date of Service: 09/29/2019 10:45 AM Medical Record Number: [id number] Patient Account Number: [id number] Date of Birth/Sex: [birth year] (73 y.o. M) Treating RN: Montey Hora Primary Care Provider: Glendon Axe Other  Clinician: Referring Provider: Glendon Axe Treating Provider/Extender: STONE III, Janina Trafton Weeks in Treatment: 2 Constitutional Well-nourished and well-hydrated in no acute distress. Respiratory normal breathing without difficulty. Psychiatric this patient is able to make decisions and demonstrates good insight into disease process. Alert and Oriented x 3. pleasant and cooperative. Notes Patient's wound bed again did require some sharp debridement I just mainly removed callus at this time and post debridement wound bed appears to be doing well with just a very pinpoint small opening at this time I think that this will likely be healed by next week fairly readily. Nonetheless I did go ahead and reapply the cast today so as to go ahead and get this to completely close. Electronic Signature(s) Signed: 09/29/2019 1:13:38 PM By: Worthy Keeler PA-C Entered By: Worthy Keeler on 09/29/2019 13:13:38 Sloan, [name] ([id number]) -------------------------------------------------------------------------------- Physician Orders Details Patient Name: [name] Date of Service: 09/29/2019 10:45 AM Medical Record Number: [id number] Patient Account Number: [id number] Date of Birth/Sex: [birth year] (73 y.o. M) Treating RN: Montey Hora Primary Care Provider: Glendon Axe Other Clinician: Referring Provider: Glendon Axe Treating Provider/Extender: Melburn Hake, Taiana Temkin Weeks in Treatment: 2 Verbal / Phone Orders: No Diagnosis Coding ICD-10 Coding Code Description E11.621 Type 2 diabetes mellitus  with foot ulcer L97.522 Non-pressure chronic ulcer of other part of left foot with fat layer exposed I10 Essential (primary) hypertension E11.43 Type 2 diabetes mellitus with diabetic autonomic (poly)neuropathy Wound Cleansing Wound #1 Left,Plantar Toe Great o Clean wound with Normal Saline. o May shower with protection. - Please do not get your cast wet Primary Wound Dressing Wound #1 Left,Plantar Toe Great o Silver Collagen Secondary Dressing Wound #1 Left,Plantar Toe Great o Foam Dressing Change Frequency Wound #1 Left,Plantar Toe Great o Change dressing every week Follow-up Appointments Wound #1 Left,Plantar Toe Great o Return Appointment in 1 week. Off-Loading Wound #1 Left,Plantar Toe Great o Total Contact Cast to Left Lower Extremity Electronic Signature(s) Signed: 09/29/2019 4:32:36 PM By: Worthy Keeler PA-C Signed: 09/29/2019 4:39:44 PM By: Montey Hora Entered By: Montey Hora on 09/29/2019 11:16:25 [place], [name] ([id number]) -------------------------------------------------------------------------------- Problem List Details Patient Name: KARAN, BOSQUE. Date of Service: 09/29/2019 10:45 AM Medical Record Number: [id number] Patient Account Number: [id number] Date of Birth/Sex: 1946/04/01 (73 y.o. M) Treating RN: Montey Hora Primary Care Provider: Glendon Axe Other Clinician: Referring Provider: Glendon Axe Treating Provider/Extender: Melburn Hake, Kyllian Clingerman Weeks in Treatment: 2 Active Problems ICD-10 Evaluated Encounter Code Description Active Date Today Diagnosis E11.621 Type 2 diabetes mellitus with foot ulcer 09/15/2019 No Yes L97.522 Non-pressure chronic ulcer of other part of left foot with fat 09/15/2019 No Yes layer exposed I10 Essential (primary) hypertension 09/15/2019 No Yes E11.43 Type 2 diabetes mellitus with diabetic autonomic (poly) 09/15/2019 No Yes neuropathy Inactive Problems Resolved Problems Electronic  Signature(s) Signed: 09/29/2019 10:43:58 AM By: Worthy Keeler PA-C Entered By: Worthy Keeler on 09/29/2019 10:43:58 [place] ([id number]) -------------------------------------------------------------------------------- Progress Note Details Patient Name: [name] Date of Service: 09/29/2019 10:45 AM Medical Record Number: [id number] Patient Account Number: [id number] Date of Birth/Sex: 1946/07/24 (73 y.o. M) Treating RN: Montey Hora Primary Care Provider: Glendon Axe Other Clinician: Referring Provider: Glendon Axe Treating Provider/Extender: Melburn Hake, Colleen Donahoe Weeks in Treatment: 2 Subjective Chief Complaint Information obtained from Patient Left great toe ulcer History of Present Illness (HPI) 12/12/18 on evaluation today patient presents as a referral from his endocrinologist regarding issues that he has been having with his left great toe. Subsequently he has been  seeing Dr. Cleda Mccreedy and Dr. Cleda Mccreedy has been the breeding the wound and in fact this was debrided this morning. The patient had an appointment there prior to coming here. Subsequently he states that even though he's been going for regular debridement after office things really do not seem to be showing signs of improvement unfortunately. He states that he is having some discomfort but nothing too significant. No fevers, chills, nausea, or vomiting noted at this time. The patient does have a history of hypertension, neuropathy, diabetes mellitus type II, and a history of stroke. Currently he's been using bacitracin on the toe and utilizing a Band-Aid. He tells me he's had this wound for two years. He cannot remember the last time he had an x-ray. 12/19/18 on evaluation today patient appears to be doing well in regard to his plantar toe ulcer. He's been tolerating the dressing changes without complication. Fortunately there does not appear to be signs of infection at this time which is  good news. No fever chills noted. 01/19/19 has been one month since I last saw the patient as he was out of town. With that being said on evaluation today it does appear that he is going to require sharp debridement and I do believe that the total contact cast would benefit him as far as being applied at this time. He is in agreement that plan. 01/23/19 on evaluation today patient appears to be doing better in regard to his left great toe ulcer. He has been shown signs of improvement week by week and although this is slow it does seem to be doing fairly well which is good news. Overall very pleased in this regard. 01/31/19 on evaluation today patient presents for follow-up concerning his great toe ulcer. He was actually supposed to be here yesterday and he did come however he ended up having to leave due to his blood sugar dropping he states he just did not eat enough lunch. Fortunately seems to be doing much better today which is excellent news. Fortunately there's no evidence of infection at this time which is also good news. No fevers, chills, nausea, or vomiting noted at this time. Overall I feel like he is making excellent progress. 02/06/19 on evaluation today patient actually appears to be doing very well in regard to his plantar toe ulcer. He's been tolerating the dressing changes without complication. Fortunately there is no sign of infection at this time. Overall been very pleased with how things seem to be progressing. No fevers, chills, nausea, or vomiting noted at this time. 02/13/19 on evaluation today patient actually appears to be doing excellent in regard to his toe ulcer which is almost completely close. Overall very pleased with that. There does not appear to be any signs of infection which is good news. Upon close inspection it appears that he has just a very slight opening still noted at the central portion of the toe although this is minimal. 02/16/19 on evaluation today patient is  seen for early follow-up due to the fact that he tells me while at home he actually got up to go answer the door without putting on the walking boot part of his cast and subsequently damaged the total contact cast. It therefore comes in to have this removed and potentially replaced. Fortunately other than it given him a little bit of pain its far as pushing in on some areas there doesn't appear to be any injury once the cast was removed. CASIMIER, DUMITRESCU ([id number]) 03/13/19 on  evaluation today patient unfortunately presents for follow-up concerning his great toe ulcer on the left. He was discharged on 02/16/19 with the wound healed at that point. He tells me this remain so for several weeks or least a couple weeks before reopening. Fortunately there does not appear to be any signs of infection at this time. Nonetheless for the past week at least he's had the wound reopened and states it is been trying to keep pressure off of this but he has not been using the offloading shoe we previously gave him. Fortunately there's no evidence of infection at this time. No fevers, chills, nausea, or vomiting noted at this time. 03/17/19 on evaluation today patient actually appears to be doing rather well in regard to his toe also. Fact this appears to be significantly smaller this week even compared to what I saw last week on evaluation. He seems to have done very well for the offloading shoe and overall I'm very pleased with the progress that is made. It may be that he doesn't even need the cast to get this to heal if he offloads this appropriately. 03/24/19 on evaluation today patient actually appears to be doing well in regard to his plantar foot ulcer. He's been tolerating the dressing changes without complication the collagen does seem to be beneficial for him. With that being said he is gonna require some miles sharp debridement today to clear away some of the callous's around the edge of the wound as  well as the minimal [place] noted on the surface of the wound. 03/31/19 on evaluation today patient appears to be doing well in regard to his plantar great toe ulcer. He has been tolerating the dressing changes without complication which is good news. Fortunately there does not appear to be any signs of active infection at this time. Overall very pleased with how things seem to be progressing. 04/11/19 on evaluation today patient's tools are appears to be doing in my pinion roughly about the same as were things that previous. Fortunately there does not appear to be signs of active infection at this time which is good news. With that being said he has not been experiencing any pain at this time which is good news there is also No fevers, chills, nausea, or vomiting noted at this time. 04/18/19 on evaluation today patient's wound actually appears to be doing fairly well today he did not even really have a lot of callous buildup. We have been debating on whether or not to reinitiate total contact cast. With that being said he seems to be doing fairly well this point with offloading in my pinion and again I think we need to come up with a way to get this healed that will also be sustainable for keeping it close. Again we were able to close it with the cast previous but again he has to be able to keep it such. For that reason we are working on trying to get him diabetic shoes he's waiting for the [place] to get in touch with the local company that has to measure him for these do not want to have a cast on at such a time as when they need to actually measure him. 04/25/19 on evaluation today patient appears to be doing well in regard to his plantar foot specifically great toe ulcer. He's been tolerating the dressing changes without complication. Fortunately there's no signs of active infection at this time. Overall I'm very pleased with how things appear currently. 05/02/19 on  evaluation today patient appears to  actually be doing better in regard to his plantar foot ulcer. He shown signs of good improvement which is excellent news. She states he can walk better with the cast on the can with the offloading shoe that he had on previous. Fortunately there's no signs of active infection at this time. No fevers, chills, nausea, or vomiting noted at this time. 05/09/19 on evaluation today patient actually appears to be doing excellent. In fact he appears to be completely healed based on evaluation at this time. Fortunately there's no signs of infection and is having no discomfort. 06/08/19 this is a follow-up visit although the patient has been healed for almost a month but not quite. He tells me that in the past several days he noted some blood coming from his toe and he thought he should come in at this checked out. Fortunately he's having a pain. He also did get his custom orthotics shoes. With that being said he still appears to have developed this ulceration there is something he tells me that the orthotic specialist stated they could do to the toes to try to help out in this regard although he did not know exactly what it was we may need to check with [facility]. 06/12/19 on evaluation today patient actually appears to be doing great in regard to his foot ulcer. The wound on his toes shown signs of excellent improvement which is great news. With that being said he is not completely close yet the cast has done wonders for him and he is definitely headed in the correct direction. No fevers, chills, nausea, or vomiting noted at this time. 06/22/19 on evaluation today fortunately patient actually appears to be healing quite well and in fact appears to be completely healed this is excellent news. Fortunately there's no signs of active infection he's been tolerating the total contact cast without complication. Overall I'm pleased with the progress that has been made. He also has his new custom shoes he brought  was TYLAN, CHESNUT ([id number]) with him today. 07/04/19 on evaluation today patient actually appears to be doing a little bit worse in regard his toe ulcer unfortunately this has yet again reopened. It has only been about a week and a half since I've last seen him I really did not expect this to reopen this quickly. Nonetheless I did discuss with him his normal routine in order to see what may be causing this. He has custom shoes. With that being said he apparently has not been using the custom shoes and even when he has used them he has not even put the insert in there which is what was custom molded to him in order to appropriately offload high-pressure point areas. In fact his exact question to me was whether or not he needed to actually put those in they just came loose in the box and he had never installed them. Nonetheless it also came to light the he's been walking around in his bedroom slippers at home he tells me he does not go barefoot but he really has not been wearing his custom offloading/diabetic shoes. Nonetheless I do believe that this is why he continually reopens as far as the wound is concerned. I discussed this with the patient in great detail today for yet again although I previously had this discussion with him in the past. 07/11/19-Patient returns at 1 week for his left great toe wound which is actually doing really well, he has been  using the insert to offload the toe and is very pleased with it. This looks like this is working out really well for him 07/18/19 on evaluation today patient appears to be doing really about the same with regard to his ulcer on the right great toe. He's been tolerating the dressing changes without complication. Fortunately there's no signs of active infection at this time. With that being said I think that we're having a difficult time getting this to heal and I do believe that initiating treatment with the total contact cast to get this to  close would be a good idea. The good news is he seems to be keeping this from worsening so hopefully that means that if we get it closed he can prevent this from reopening in the future. At this point my suggestion was that we go ahead and initiate treatment at both sites utilizing collagen. We will then subsequently put a piece of silver out and it between the toes of the right foot in the webbing between the fourth and fifth toe in order to help keep things dry and allow this to hopefully close and we heal without complication. The patient is in agreement with plan. Subsequently I am hopeful that both wounds will heal quite quickly we have been very close now with the ankle and again the toe has reopened but fortunately doesn't appear to be to bad at this point. No antibiotics were prescribed today as they did not appear to be necessary. 07/25/19 on evaluation today patient actually appears to be doing excellent in regard to his great toe ulcer. In fact this really appears to be almost completely healed if indeed it is not in fact healed. With that being said there is still the eighth small pinpoint area that could be open although I tend to doubt it. Nonetheless it is something that I would like to continue to watch for more week I do think it would be beneficial for [place] to go ahead and continue with the cast for one more week before deciding to discharge him especially in light of the issues he's had in the past. Specifically with regard to the wound reopening rather quickly. 08/01/2019 upon evaluation today patient actually appears to be doing well with regard to his great toe ulcer. He has been tolerating the dressing changes without complication. Fortunately we think appears to be completely healed at this time which is great news. I am very pleased with the progress up to this point. In fact he appears to be completely healed at this point. Readmission: 09/15/2019 patient presents today for  reevaluation here in our clinic concerning issues that he is having with his left great toe as he did previous. We have not actually seen him since August 01, 2019. He had been doing very well until he noticed when walking into the bathroom with just the socks on the other night that he had a little bit of bleeding from the toe location. Subsequently he decided to make an appointment to come in as he did not want this to get significantly worse. There does not appear to be any signs of active infection he tells me as long as he was wearing his shoes he seemed to be doing okay is walking around not necessarily barefoot but just with socks on that I think may be his main problem at this time. 09/22/2019 on evaluation today patient actually appears to be doing quite well with regard to his great toe ulcer. This  is making signs of good improvement even in the short amount of time we have been taking care of his toe since he came back. Is just been 1 week. Nonetheless the improvement in the wound size is excellent he also really has no depth and no evidence of infection which is great news. 09/29/2019 on evaluation today patient appears to be doing well with regard to his toe ulcer. This is showing signs of improvement in fact it was questioning whether or not today this actually was healed. However there did appear to be some callus covering the surface of the wound was removed there did appear to be a small pinpoint opening still in the middle of the wound although this is minimal. Patient History Information obtained from Patient. DARYAN, AREOLA ([id number]) Family History Diabetes - Mother, Heart Disease - Father, Stroke - Father, No family history of Cancer, Hereditary Spherocytosis, Hypertension, Kidney Disease, Lung Disease, Seizures, Thyroid Problems, Tuberculosis. Social History Never smoker, Marital Status - Widowed, Alcohol Use - Never, Drug Use - No History, Caffeine Use -  Moderate. Medical History Eyes Denies history of Cataracts, Glaucoma, Optic Neuritis Ear/Nose/Mouth/Throat Denies history of Chronic sinus problems/congestion, Middle ear problems Hematologic/Lymphatic Denies history of Anemia, Hemophilia, Human Immunodeficiency Virus, Lymphedema Respiratory Denies history of Aspiration, Asthma, Chronic Obstructive Pulmonary Disease (COPD), Pneumothorax, Sleep Apnea, Tuberculosis Cardiovascular Patient has history of Hypertension Denies history of Angina, Arrhythmia, Congestive Heart Failure, Coronary Artery Disease, Deep Vein Thrombosis, Hypotension, Myocardial Infarction, Peripheral Arterial Disease, Peripheral Venous Disease, Phlebitis, Vasculitis Gastrointestinal Denies history of Cirrhosis , Colitis, Crohn s, Hepatitis A, Hepatitis B, Hepatitis C Endocrine Patient has history of Type II Diabetes Denies history of Type I Diabetes Genitourinary Denies history of End Stage Renal Disease Immunological Denies history of Lupus Erythematosus, Raynaud s, Scleroderma Integumentary (Skin) Denies history of History of Burn, History of pressure wounds Musculoskeletal Denies history of Gout, Rheumatoid Arthritis, Osteoarthritis, Osteomyelitis Neurologic Denies history of Dementia, Neuropathy, Quadriplegia, Paraplegia, Seizure Disorder Oncologic Denies history of Received Chemotherapy, Received Radiation Review of Systems (ROS) Constitutional Symptoms (General Health) Denies complaints or symptoms of Fatigue, Fever, Chills, Marked Weight Change. Respiratory Denies complaints or symptoms of Chronic or frequent coughs, Shortness of Breath. Cardiovascular Denies complaints or symptoms of Chest pain, LE edema. Psychiatric Denies complaints or symptoms of Anxiety, Claustrophobia. DEKKER, SAMBORSKI ([id number]) Objective Constitutional Well-nourished and well-hydrated in no acute distress. Vitals Time Taken: 10:45 AM, Height: 72 in, Weight: 256  lbs, BMI: 34.7, Temperature: 98.4 F, Pulse: 67 bpm, Respiratory Rate: 16 breaths/min, Blood Pressure: 136/84 mmHg. Respiratory normal breathing without difficulty. Psychiatric this patient is able to make decisions and demonstrates good insight into disease process. Alert and Oriented x 3. pleasant and cooperative. General Notes: Patient's wound bed again did require some sharp debridement I just mainly removed callus at this time and post debridement wound bed appears to be doing well with just a very pinpoint small opening at this time I think that this will likely be healed by next week fairly readily. Nonetheless I did go ahead and reapply the cast today so as to go ahead and get this to completely close. Integumentary (Hair, Skin) Wound #1 status is Open. Original cause of wound was Gradually Appeared. The wound is located on the [employer]. The wound measures 0.1cm length x 0.1cm width x 0.1cm depth; 0.008cm^2 area and 0.001cm^3 volume. There is no tunneling or undermining noted. There is a none present amount of drainage noted. The wound margin is flat and  intact. There is no granulation within the wound bed. There is a large (67-100%) amount of necrotic tissue within the wound bed including Eschar. Assessment Active Problems ICD-10 Type 2 diabetes mellitus with foot ulcer Non-pressure chronic ulcer of other part of left foot with fat layer exposed Essential (primary) hypertension Type 2 diabetes mellitus with diabetic autonomic (poly)neuropathy Procedures Wound #1 Pre-procedure diagnosis of Wound #1 is a Diabetic Wound/Ulcer of the Lower Extremity located on the Left,Plantar Toe Great .Severity of Tissue Pre Debridement is: Fat layer exposed. There was a Selective/Open Wound Non-Viable Tissue Debridement with a total area of 0.01 sq cm performed by STONE III, Hisako Bugh E., PA-C. With the following instrument(s): Curette to remove Non-Viable tissue/material. Material  removed includes Callus after achieving pain control using Lidocaine 4% Topical Solution. No specimens were taken. A time out was conducted at 11:13, prior to the start of the procedure. There was no bleeding. The procedure was tolerated well with a pain level of 0 throughout and a pain level of 0 following the procedure. Post Debridement Measurements: 0.1cm length x 0.1cm width x 0.2cm depth; 0.002cm^3 volume. DETRAVION, RONCHETTI ([id number]) Character of Wound/Ulcer Post Debridement is improved. Severity of Tissue Post Debridement is: Fat layer exposed. Post procedure Diagnosis Wound #1: Same as Pre-Procedure Pre-procedure diagnosis of Wound #1 is a Diabetic Wound/Ulcer of the Lower Extremity located on the Left,Plantar Toe Great . There was a Total Contact Cast Procedure by STONE III, Sou Nohr E., PA-C. Post procedure Diagnosis Wound #1: Same as Pre-Procedure Plan Wound Cleansing: Wound #1 Left,Plantar Toe Great: Clean wound with Normal Saline. May shower with protection. - Please do not get your cast wet Primary Wound Dressing: Wound #1 Left,Plantar Toe Great: Silver Collagen Secondary Dressing: Wound #1 Left,Plantar Toe Great: Foam Dressing Change Frequency: Wound #1 Left,Plantar Toe Great: Change dressing every week Follow-up Appointments: Wound #1 Left,Plantar Toe Great: Return Appointment in 1 week. Off-Loading: Wound #1 Left,Plantar Toe Great: Total Contact Cast to Left Lower Extremity 1 I would recommend that we go ahead and continue with the total contact cast at this time that was reapplied by myself today. 2. We will continue with the collagen dressing which was utilized last week that seems to be doing well. 3. The patient obviously knows the deal with the cast he is not to walk without the boot portion on he does very well and is extremely compliant. We will see patient back for reevaluation in 1 week here in the clinic. If anything worsens or changes patient will  contact our office for additional recommendations. Electronic Signature(s) Signed: 09/29/2019 1:14:09 PM By: Worthy Keeler PA-C Entered By: Worthy Keeler on 09/29/2019 13:14:09 Hammond, [name] ([id number]) -------------------------------------------------------------------------------- ROS/PFSH Details Patient Name: [name] Date of Service: 09/29/2019 10:45 AM Medical Record Number: [id number] Patient Account Number: [id number] Date of Birth/Sex: 12-21-1946 (73 y.o. M) Treating RN: Montey Hora Primary Care Provider: Glendon Axe Other Clinician: Referring Provider: Glendon Axe Treating Provider/Extender: STONE III, Victorio Creeden Weeks in Treatment: 2 Information Obtained From Patient Constitutional Symptoms (General Health) Complaints and Symptoms: Negative for: Fatigue; Fever; Chills; Marked Weight Change Respiratory Complaints and Symptoms: Negative for: Chronic or frequent coughs; Shortness of Breath Medical History: Negative for: Aspiration; Asthma; Chronic Obstructive Pulmonary Disease (COPD); Pneumothorax; Sleep Apnea; Tuberculosis Cardiovascular Complaints and Symptoms: Negative for: Chest pain; LE edema Medical History: Positive for: Hypertension Negative for: Angina; Arrhythmia; Congestive Heart Failure; Coronary Artery Disease; Deep Vein Thrombosis; Hypotension; Myocardial Infarction; Peripheral Arterial Disease; Peripheral Venous Disease;  Phlebitis; Vasculitis Psychiatric Complaints and Symptoms: Negative for: Anxiety; Claustrophobia Eyes Medical History: Negative for: Cataracts; Glaucoma; Optic Neuritis Ear/Nose/Mouth/Throat Medical History: Negative for: Chronic sinus problems/congestion; Middle ear problems Hematologic/Lymphatic Medical History: Negative for: Anemia; Hemophilia; Human Immunodeficiency Virus; Lymphedema Gastrointestinal MACSON, HINGER ([id number]) Medical History: Negative for: Cirrhosis ; Colitis; Crohnos;  Hepatitis A; Hepatitis B; Hepatitis C Endocrine Medical History: Positive for: Type II Diabetes Negative for: Type I Diabetes Time with diabetes: 1990 Treated with: Insulin Blood sugar tested every day: Yes Tested : Genitourinary Medical History: Negative for: End Stage Renal Disease Immunological Medical History: Negative for: Lupus Erythematosus; Raynaudos; Scleroderma Integumentary (Skin) Medical History: Negative for: History of Burn; History of pressure wounds Musculoskeletal Medical History: Negative for: Gout; Rheumatoid Arthritis; Osteoarthritis; Osteomyelitis Neurologic Medical History: Negative for: Dementia; Neuropathy; Quadriplegia; Paraplegia; Seizure Disorder Oncologic Medical History: Negative for: Received Chemotherapy; Received Radiation Immunizations Pneumococcal Vaccine: Received Pneumococcal Vaccination: Yes Implantable Devices None Family and Social History Cancer: No; Diabetes: Yes - Mother; Heart Disease: Yes - Father; Hereditary Spherocytosis: No; Hypertension: No; Kidney Disease: No; Lung Disease: No; Seizures: No; Stroke: Yes - Father; Thyroid Problems: No; Tuberculosis: No; Never smoker; Marital Status - Widowed; Alcohol Use: Never; Drug Use: No History; Caffeine Use: Moderate; Financial Concerns: No; Food, Clothing or Shelter Needs: No; Support System Lacking: No; Transportation Concerns: No Physician Affirmation I have reviewed and agree with the above information. SHERRILL, TESCHENDORF ([id number]) Electronic Signature(s) Signed: 09/29/2019 4:32:36 PM By: Worthy Keeler PA-C Signed: 09/29/2019 4:39:44 PM By: Montey Hora Entered By: Worthy Keeler on 09/29/2019 13:13:25 Neyens, [name] ([id number]) -------------------------------------------------------------------------------- Total Contact Cast Details Patient Name: GABRIELL, LACHNEY Date of Service: 09/29/2019 10:45 AM Medical Record Number: [id number] Patient Account  Number: [id number] Date of Birth/Sex: July 18, 1946 (73 y.o. M) Treating RN: Montey Hora Primary Care Provider: Glendon Axe Other Clinician: Referring Provider: Glendon Axe Treating Provider/Extender: STONE III, Kip Kautzman Weeks in Treatment: 2 Total Contact Cast Applied for Wound Assessment: Wound #1 Left,Plantar Toe Great Performed By: Physician Emilio Math., PA-C Post Procedure Diagnosis Same as Pre-procedure Electronic Signature(s) Signed: 09/29/2019 4:32:36 PM By: Worthy Keeler PA-C Signed: 09/29/2019 4:39:44 PM By: Montey Hora Entered By: Montey Hora on 09/29/2019 11:28:49 [name] ([id number]) -------------------------------------------------------------------------------- SuperBill Details Patient Name: [name]. Date of Service: 09/29/2019 Medical Record Number: [id number] Patient Account Number: [id number] Date of Birth/Sex: [birth year] (73 y.o. M) Treating RN: Montey Hora Primary Care Provider: Glendon Axe Other Clinician: Referring Provider: Glendon Axe Treating Provider/Extender: Melburn Hake, Melissaann Dizdarevic Weeks in Treatment: 2 Diagnosis Coding ICD-10 Codes Code Description E11.621 Type 2 diabetes mellitus with foot ulcer L97.522 Non-pressure chronic ulcer of other part of left foot with fat layer exposed I10 Essential (primary) hypertension E11.43 Type 2 diabetes mellitus with diabetic autonomic (poly)neuropathy Facility Procedures CPT4 Code: [id number] Description: [phone] - DEBRIDE WOUND 1ST 20 SQ CM OR < ICD-10 Diagnosis Description L97.522 Non-pressure chronic ulcer of other part of left foot with fat Modifier: layer exposed Quantity: 1 Physician Procedures CPT4 Code: [id number] Description: [id number] - WC PHYS DEBR WO ANESTH 20 SQ CM ICD-10 Diagnosis Description L97.522 Non-pressure chronic ulcer of other part of left foot with fat Modifier: layer exposed Quantity: 1 Electronic Signature(s) Signed: 09/29/2019 1:14:17 PM By: Worthy Keeler PA-C Entered By: Worthy Keeler on 09/29/2019 13:14:17

## 2019-10-06 ENCOUNTER — Encounter: Payer: Medicare Other | Admitting: Physician Assistant

## 2019-10-06 ENCOUNTER — Other Ambulatory Visit: Payer: Self-pay

## 2019-10-06 DIAGNOSIS — Z09 Encounter for follow-up examination after completed treatment for conditions other than malignant neoplasm: Secondary | ICD-10-CM | POA: Diagnosis not present

## 2019-10-06 NOTE — Progress Notes (Addendum)
[name], [name] ([id number]) Visit Report for 10/06/2019 Chief Complaint Document Details Patient Name: [name], [name]. Date of Service: 10/06/2019 10:45 AM Medical Record Number: [id number] Patient Account Number: [id number] Date of Birth/Sex: [birth year] (73 y.o. M) Treating RN: Montey Hora Primary Care Provider: Glendon Axe Other Clinician: Referring Provider: Glendon Axe Treating Provider/Extender: Melburn Hake, HOYT Weeks in Treatment: 3 Information Obtained from: Patient Chief Complaint Left great toe ulcer Electronic Signature(s) Signed: 10/06/2019 11:05:27 AM By: Worthy Keeler PA-C Entered By: Worthy Keeler on 10/06/2019 11:05:27 [place], [name] ([id number]) -------------------------------------------------------------------------------- HPI Details Patient Name: [name] Date of Service: 10/06/2019 10:45 AM Medical Record Number: [id number] Patient Account Number: [id number] Date of Birth/Sex: Oct 17, 1946 (73 y.o. M) Treating RN: Montey Hora Primary Care Provider: Glendon Axe Other Clinician: Referring Provider: Glendon Axe Treating Provider/Extender: Melburn Hake, HOYT Weeks in Treatment: 3 History of Present Illness HPI Description: 12/12/18 on evaluation today patient presents as a referral from his endocrinologist regarding issues that he has been having with his left great toe. Subsequently he has been seeing Dr. Cleda Mccreedy and Dr. Cleda Mccreedy has been the breeding the wound and in fact this was debrided this morning. The patient had an appointment there prior to coming here. Subsequently he states that even though he's been going for regular debridement after office things really do not seem to be showing signs of improvement unfortunately. He states that he is having some discomfort but nothing too significant. No fevers, chills, nausea, or vomiting noted at this time. The patient does have a history of hypertension, neuropathy,  diabetes mellitus type II, and a history of stroke. Currently he's been using bacitracin on the toe and utilizing a Band-Aid. He tells me he's had this wound for two years. He cannot remember the last time he had an x-ray. 12/19/18 on evaluation today patient appears to be doing well in regard to his plantar toe ulcer. He's been tolerating the dressing changes without complication. Fortunately there does not appear to be signs of infection at this time which is good news. No fever chills noted. 01/19/19 has been one month since I last saw the patient as he was out of town. With that being said on evaluation today it does appear that he is going to require sharp debridement and I do believe that the total contact cast would benefit him as far as being applied at this time. He is in agreement that plan. 01/23/19 on evaluation today patient appears to be doing better in regard to his left great toe ulcer. He has been shown signs of improvement week by week and although this is slow it does seem to be doing fairly well which is good news. Overall very pleased in this regard. 01/31/19 on evaluation today patient presents for follow-up concerning his great toe ulcer. He was actually supposed to be here yesterday and he did come however he ended up having to leave due to his [name] sugar dropping he states he just did not eat enough lunch. Fortunately seems to be doing much better today which is excellent news. Fortunately there's no evidence of infection at this time which is also good news. No fevers, chills, nausea, or vomiting noted at this time. Overall I feel like he is making excellent progress. 02/06/19 on evaluation today patient actually appears to be doing very well in regard to his plantar toe ulcer. He's been tolerating the dressing changes without complication. Fortunately there is no sign of infection at this time. Overall been  very pleased with how things seem to be progressing. No fevers,  chills, nausea, or vomiting noted at this time. 02/13/19 on evaluation today patient actually appears to be doing excellent in regard to his toe ulcer which is almost completely close. Overall very pleased with that. There does not appear to be any signs of infection which is good news. Upon close inspection it appears that he has just a very slight opening still noted at the central portion of the toe although this is minimal. 02/16/19 on evaluation today patient is seen for early follow-up due to the fact that he tells me while at home he actually got up to go answer the door without putting on the walking boot part of his cast and subsequently damaged the total contact cast. It therefore comes in to have this removed and potentially replaced. Fortunately other than it given him a little bit of pain its far as pushing in on some areas there doesn't appear to be any injury once the cast was removed. 03/13/19 on evaluation today patient unfortunately presents for follow-up concerning his great toe ulcer on the left. He was discharged on 02/16/19 with the wound healed at that point. He tells me this remain so for several weeks or least a couple weeks before reopening. Fortunately there does not appear to be any signs of infection at this time. Nonetheless for the past week at least he's had the wound reopened and states it is been trying to keep pressure off of this but he has not been using the offloading shoe we previously gave him. Fortunately there's no evidence of infection at this time. No fevers, chills, nausea, or vomiting noted at this time. [name], [name] ([id number]) 03/17/19 on evaluation today patient actually appears to be doing rather well in regard to his toe also. Fact this appears to be significantly smaller this week even compared to what I saw last week on evaluation. He seems to have done very well for the offloading shoe and overall I'm very pleased with the progress that is  made. It may be that he doesn't even need the cast to get this to heal if he offloads this appropriately. 03/24/19 on evaluation today patient actually appears to be doing well in regard to his plantar foot ulcer. He's been tolerating the dressing changes without complication the collagen does seem to be beneficial for him. With that being said he is gonna require some miles sharp debridement today to clear away some of the callous's around the edge of the wound as well as the minimal [place] noted on the surface of the wound. 03/31/19 on evaluation today patient appears to be doing well in regard to his plantar great toe ulcer. He has been tolerating the dressing changes without complication which is good news. Fortunately there does not appear to be any signs of active infection at this time. Overall very pleased with how things seem to be progressing. 04/11/19 on evaluation today patient's tools are appears to be doing in my pinion roughly about the same as were things that previous. Fortunately there does not appear to be signs of active infection at this time which is good news. With that being said he has not been experiencing any pain at this time which is good news there is also No fevers, chills, nausea, or vomiting noted at this time. 04/18/19 on evaluation today patient's wound actually appears to be doing fairly well today he did not even really have a lot of  callous buildup. We have been debating on whether or not to reinitiate total contact cast. With that being said he seems to be doing fairly well this point with offloading in my pinion and again I think we need to come up with a way to get this healed that will also be sustainable for keeping it close. Again we were able to close it with the cast previous but again he has to be able to keep it such. For that reason we are working on trying to get him diabetic shoes he's waiting for the [place] to get in touch with the local company that has  to measure him for these do not want to have a cast on at such a time as when they need to actually measure him. 04/25/19 on evaluation today patient appears to be doing well in regard to his plantar foot specifically great toe ulcer. He's been tolerating the dressing changes without complication. Fortunately there's no signs of active infection at this time. Overall I'm very pleased with how things appear currently. 05/02/19 on evaluation today patient appears to actually be doing better in regard to his plantar foot ulcer. He shown signs of good improvement which is excellent news. She states he can walk better with the cast on the can with the offloading shoe that he had on previous. Fortunately there's no signs of active infection at this time. No fevers, chills, nausea, or vomiting noted at this time. 05/09/19 on evaluation today patient actually appears to be doing excellent. In fact he appears to be completely healed based on evaluation at this time. Fortunately there's no signs of infection and is having no discomfort. 06/08/19 this is a follow-up visit although the patient has been healed for almost a month but not quite. He tells me that in the past several days he noted some [name] coming from his toe and he thought he should come in at this checked out. Fortunately he's having a pain. He also did get his custom orthotics shoes. With that being said he still appears to have developed this ulceration there is something he tells me that the orthotic specialist stated they could do to the toes to try to help out in this regard although he did not know exactly what it was we may need to check with [facility]. 06/12/19 on evaluation today patient actually appears to be doing great in regard to his foot ulcer. The wound on his toes shown signs of excellent improvement which is great news. With that being said he is not completely close yet the cast has done wonders for him and he is definitely  headed in the correct direction. No fevers, chills, nausea, or vomiting noted at this time. 06/22/19 on evaluation today fortunately patient actually appears to be healing quite well and in fact appears to be completely healed this is excellent news. Fortunately there's no signs of active infection he's been tolerating the total contact cast without complication. Overall I'm pleased with the progress that has been made. He also has his new custom shoes he brought was with him today. 07/04/19 on evaluation today patient actually appears to be doing a little bit worse in regard his toe ulcer unfortunately this has yet again reopened. It has only been about a week and a half since I've last seen him I really did not expect this to reopen this quickly. Nonetheless I did discuss with him his normal routine in order to see what may be causing this.  He has custom shoes. With that being said he apparently has not been using the custom shoes and even when he has used them he has not [name], [name]. ([id number]) even put the insert in there which is what was custom molded to him in order to appropriately offload high-pressure point areas. In fact his exact question to me was whether or not he needed to actually put those in they just came loose in the box and he had never installed them. Nonetheless it also came to light the he's been walking around in his bedroom slippers at home he tells me he does not go barefoot but he really has not been wearing his custom offloading/diabetic shoes. Nonetheless I do believe that this is why he continually reopens as far as the wound is concerned. I discussed this with the patient in great detail today for yet again although I previously had this discussion with him in the past. 07/11/19-Patient returns at 1 week for his left great toe wound which is actually doing really well, he has been using the insert to offload the toe and is very pleased with it. This looks  like this is working out really well for him 07/18/19 on evaluation today patient appears to be doing really about the same with regard to his ulcer on the right great toe. He's been tolerating the dressing changes without complication. Fortunately there's no signs of active infection at this time. With that being said I think that we're having a difficult time getting this to heal and I do believe that initiating treatment with the total contact cast to get this to close would be a good idea. The good news is he seems to be keeping this from worsening so hopefully that means that if we get it closed he can prevent this from reopening in the future. At this point my suggestion was that we go ahead and initiate treatment at both sites utilizing collagen. We will then subsequently put a piece of silver out and it between the toes of the right foot in the webbing between the fourth and fifth toe in order to help keep things dry and allow this to hopefully close and we heal without complication. The patient is in agreement with plan. Subsequently I am hopeful that both wounds will heal quite quickly we have been very close now with the ankle and again the toe has reopened but fortunately doesn't appear to be to bad at this point. No antibiotics were prescribed today as they did not appear to be necessary. 07/25/19 on evaluation today patient actually appears to be doing excellent in regard to his great toe ulcer. In fact this really appears to be almost completely healed if indeed it is not in fact healed. With that being said there is still the eighth small pinpoint area that could be open although I tend to doubt it. Nonetheless it is something that I would like to continue to watch for more week I do think it would be beneficial for [place] to go ahead and continue with the cast for one more week before deciding to discharge him especially in light of the issues he's had in the past. Specifically with regard  to the wound reopening rather quickly. 08/01/2019 upon evaluation today patient actually appears to be doing well with regard to his great toe ulcer. He has been tolerating the dressing changes without complication. Fortunately we think appears to be completely healed at this time which is great news.  I am very pleased with the progress up to this point. In fact he appears to be completely healed at this point. Readmission: 09/15/2019 patient presents today for reevaluation here in our clinic concerning issues that he is having with his left great toe as he did previous. We have not actually seen him since August 01, 2019. He had been doing very well until he noticed when walking into the bathroom with just the socks on the other night that he had a little bit of bleeding from the toe location. Subsequently he decided to make an appointment to come in as he did not want this to get significantly worse. There does not appear to be any signs of active infection he tells me as long as he was wearing his shoes he seemed to be doing okay is walking around not necessarily barefoot but just with socks on that I think may be his main problem at this time. 09/22/2019 on evaluation today patient actually appears to be doing quite well with regard to his great toe ulcer. This is making signs of good improvement even in the short amount of time we have been taking care of his toe since he came back. Is just been 1 week. Nonetheless the improvement in the wound size is excellent he also really has no depth and no evidence of infection which is great news. 09/29/2019 on evaluation today patient appears to be doing well with regard to his toe ulcer. This is showing signs of improvement in fact it was questioning whether or not today this actually was healed. However there did appear to be some callus covering the surface of the wound was removed there did appear to be a small pinpoint opening still in the middle of  the wound although this is minimal. 10/06/2019 on evaluation today patient appears to be doing excellent in regard to his toe ulcer which actually showed signs of complete Epithelization today. There is no evidence of active infection which is good news. Overall he has been tolerating the cast without complication but I think he is done with that as of now. Electronic Signature(s) Signed: 10/06/2019 12:53:49 PM By: Worthy Keeler PA-C Entered By: Worthy Keeler on 10/06/2019 12:53:48 [place], [name] ([id number]) [name], [name] ([id number]) -------------------------------------------------------------------------------- Physical Exam Details Patient Name: [name], [name] Date of Service: 10/06/2019 10:45 AM Medical Record Number: [id number] Patient Account Number: [id number] Date of Birth/Sex: [birth year] (73 y.o. M) Treating RN: Montey Hora Primary Care Provider: Glendon Axe Other Clinician: Referring Provider: Glendon Axe Treating Provider/Extender: STONE III, HOYT Weeks in Treatment: 3 Constitutional Well-nourished and well-hydrated in no acute distress. Respiratory normal breathing without difficulty. Psychiatric this patient is able to make decisions and demonstrates good insight into disease process. Alert and Oriented x 3. pleasant and cooperative. Notes Patient's wound bed currently again showed signs of complete epithelialization he seems to be doing quite well which is good news. Overall very pleased. Electronic Signature(s) Signed: 10/06/2019 1:12:42 PM By: Worthy Keeler PA-C Entered By: Worthy Keeler on 10/06/2019 13:12:42 [place], [name] ([id number]) -------------------------------------------------------------------------------- Physician Orders Details Patient Name: [name] Date of Service: 10/06/2019 10:45 AM Medical Record Number: [id number] Patient Account Number: [id number] Date of Birth/Sex: May 08, 1946 (73  y.o. M) Treating RN: Montey Hora Primary Care Provider: Glendon Axe Other Clinician: Referring Provider: Glendon Axe Treating Provider/Extender: STONE III, HOYT Weeks in Treatment: 3 Verbal / Phone Orders: No Diagnosis Coding ICD-10 Coding Code Description E11.621 Type 2 diabetes mellitus with  foot ulcer L97.522 Non-pressure chronic ulcer of other part of left foot with fat layer exposed I10 Essential (primary) hypertension E11.43 Type 2 diabetes mellitus with diabetic autonomic (poly)neuropathy Discharge From [facility] o Discharge from [place] Signature(s) Signed: 10/06/2019 4:36:34 PM By: Montey Hora Signed: 10/06/2019 5:06:41 PM By: Worthy Keeler PA-C Entered By: Montey Hora on 10/06/2019 11:14:10 [place], [name] ([id number]) -------------------------------------------------------------------------------- Problem List Details Patient Name: [name]. Date of Service: 10/06/2019 10:45 AM Medical Record Number: [id number] Patient Account Number: [id number] Date of Birth/Sex: [birth year] (74 y.o. M) Treating RN: Montey Hora Primary Care Provider: Glendon Axe Other Clinician: Referring Provider: Glendon Axe Treating Provider/Extender: Melburn Hake, HOYT Weeks in Treatment: 3 Active Problems ICD-10 Evaluated Encounter Code Description Active Date Today Diagnosis E11.621 Type 2 diabetes mellitus with foot ulcer 09/15/2019 No Yes L97.522 Non-pressure chronic ulcer of other part of left foot with fat 09/15/2019 No Yes layer exposed I10 Essential (primary) hypertension 09/15/2019 No Yes E11.43 Type 2 diabetes mellitus with diabetic autonomic (poly) 09/15/2019 No Yes neuropathy Inactive Problems Resolved Problems Electronic Signature(s) Signed: 10/06/2019 11:05:21 AM By: Worthy Keeler PA-C Entered By: Worthy Keeler on 10/06/2019 11:05:20 [place], [name]  ([id number]) -------------------------------------------------------------------------------- Progress Note Details Patient Name: [name] Date of Service: 10/06/2019 10:45 AM Medical Record Number: [id number] Patient Account Number: [id number] Date of Birth/Sex: November 26, 1946 (73 y.o. M) Treating RN: Montey Hora Primary Care Provider: Glendon Axe Other Clinician: Referring Provider: Glendon Axe Treating Provider/Extender: Melburn Hake, HOYT Weeks in Treatment: 3 Subjective Chief Complaint Information obtained from Patient Left great toe ulcer History of Present Illness (HPI) 12/12/18 on evaluation today patient presents as a referral from his endocrinologist regarding issues that he has been having with his left great toe. Subsequently he has been seeing Dr. Cleda Mccreedy and Dr. Cleda Mccreedy has been the breeding the wound and in fact this was debrided this morning. The patient had an appointment there prior to coming here. Subsequently he states that even though he's been going for regular debridement after office things really do not seem to be showing signs of improvement unfortunately. He states that he is having some discomfort but nothing too significant. No fevers, chills, nausea, or vomiting noted at this time. The patient does have a history of hypertension, neuropathy, diabetes mellitus type II, and a history of stroke. Currently he's been using bacitracin on the toe and utilizing a Band-Aid. He tells me he's had this wound for two years. He cannot remember the last time he had an x-ray. 12/19/18 on evaluation today patient appears to be doing well in regard to his plantar toe ulcer. He's been tolerating the dressing changes without complication. Fortunately there does not appear to be signs of infection at this time which is good news. No fever chills noted. 01/19/19 has been one month since I last saw the patient as he was out of town. With that being said on evaluation today  it does appear that he is going to require sharp debridement and I do believe that the total contact cast would benefit him as far as being applied at this time. He is in agreement that plan. 01/23/19 on evaluation today patient appears to be doing better in regard to his left great toe ulcer. He has been shown signs of improvement week by week and although this is slow it does seem to be doing fairly well which is good news. Overall very pleased in this regard. 01/31/19 on evaluation today patient presents  for follow-up concerning his great toe ulcer. He was actually supposed to be here yesterday and he did come however he ended up having to leave due to his [name] sugar dropping he states he just did not eat enough lunch. Fortunately seems to be doing much better today which is excellent news. Fortunately there's no evidence of infection at this time which is also good news. No fevers, chills, nausea, or vomiting noted at this time. Overall I feel like he is making excellent progress. 02/06/19 on evaluation today patient actually appears to be doing very well in regard to his plantar toe ulcer. He's been tolerating the dressing changes without complication. Fortunately there is no sign of infection at this time. Overall been very pleased with how things seem to be progressing. No fevers, chills, nausea, or vomiting noted at this time. 02/13/19 on evaluation today patient actually appears to be doing excellent in regard to his toe ulcer which is almost completely close. Overall very pleased with that. There does not appear to be any signs of infection which is good news. Upon close inspection it appears that he has just a very slight opening still noted at the central portion of the toe although this is minimal. 02/16/19 on evaluation today patient is seen for early follow-up due to the fact that he tells me while at home he actually got up to go answer the door without putting on the walking boot part  of his cast and subsequently damaged the total contact cast. It therefore comes in to have this removed and potentially replaced. Fortunately other than it given him a little bit of pain its far as pushing in on some areas there doesn't appear to be any injury once the cast was removed. [name], [name] ([id number]) 03/13/19 on evaluation today patient unfortunately presents for follow-up concerning his great toe ulcer on the left. He was discharged on 02/16/19 with the wound healed at that point. He tells me this remain so for several weeks or least a couple weeks before reopening. Fortunately there does not appear to be any signs of infection at this time. Nonetheless for the past week at least he's had the wound reopened and states it is been trying to keep pressure off of this but he has not been using the offloading shoe we previously gave him. Fortunately there's no evidence of infection at this time. No fevers, chills, nausea, or vomiting noted at this time. 03/17/19 on evaluation today patient actually appears to be doing rather well in regard to his toe also. Fact this appears to be significantly smaller this week even compared to what I saw last week on evaluation. He seems to have done very well for the offloading shoe and overall I'm very pleased with the progress that is made. It may be that he doesn't even need the cast to get this to heal if he offloads this appropriately. 03/24/19 on evaluation today patient actually appears to be doing well in regard to his plantar foot ulcer. He's been tolerating the dressing changes without complication the collagen does seem to be beneficial for him. With that being said he is gonna require some miles sharp debridement today to clear away some of the callous's around the edge of the wound as well as the minimal [place] noted on the surface of the wound. 03/31/19 on evaluation today patient appears to be doing well in regard to his plantar great  toe ulcer. He has been tolerating the dressing changes without  complication which is good news. Fortunately there does not appear to be any signs of active infection at this time. Overall very pleased with how things seem to be progressing. 04/11/19 on evaluation today patient's tools are appears to be doing in my pinion roughly about the same as were things that previous. Fortunately there does not appear to be signs of active infection at this time which is good news. With that being said he has not been experiencing any pain at this time which is good news there is also No fevers, chills, nausea, or vomiting noted at this time. 04/18/19 on evaluation today patient's wound actually appears to be doing fairly well today he did not even really have a lot of callous buildup. We have been debating on whether or not to reinitiate total contact cast. With that being said he seems to be doing fairly well this point with offloading in my pinion and again I think we need to come up with a way to get this healed that will also be sustainable for keeping it close. Again we were able to close it with the cast previous but again he has to be able to keep it such. For that reason we are working on trying to get him diabetic shoes he's waiting for the [employer] to get in touch with the local company that has to measure him for these do not want to have a cast on at such a time as when they need to actually measure him. 04/25/19 on evaluation today patient appears to be doing well in regard to his plantar foot specifically great toe ulcer. He's been tolerating the dressing changes without complication. Fortunately there's no signs of active infection at this time. Overall I'm very pleased with how things appear currently. 05/02/19 on evaluation today patient appears to actually be doing better in regard to his plantar foot ulcer. He shown signs of good improvement which is excellent news. She states he can walk better with  the cast on the can with the offloading shoe that he had on previous. Fortunately there's no signs of active infection at this time. No fevers, chills, nausea, or vomiting noted at this time. 05/09/19 on evaluation today patient actually appears to be doing excellent. In fact he appears to be completely healed based on evaluation at this time. Fortunately there's no signs of infection and is having no discomfort. 06/08/19 this is a follow-up visit although the patient has been healed for almost a month but not quite. He tells me that in the past several days he noted some [name] coming from his toe and he thought he should come in at this checked out. Fortunately he's having a pain. He also did get his custom orthotics shoes. With that being said he still appears to have developed this ulceration there is something he tells me that the orthotic specialist stated they could do to the toes to try to help out in this regard although he did not know exactly what it was we may need to check with [facility]. 06/12/19 on evaluation today patient actually appears to be doing great in regard to his foot ulcer. The wound on his toes shown signs of excellent improvement which is great news. With that being said he is not completely close yet the cast has done wonders for him and he is definitely headed in the correct direction. No fevers, chills, nausea, or vomiting noted at this time. 06/22/19 on evaluation today fortunately patient actually appears  to be healing quite well and in fact appears to be completely healed this is excellent news. Fortunately there's no signs of active infection he's been tolerating the total contact cast without complication. Overall I'm pleased with the progress that has been made. He also has his new custom shoes he brought was [name], [name] ([id number]) with him today. 07/04/19 on evaluation today patient actually appears to be doing a little bit worse in regard his toe  ulcer unfortunately this has yet again reopened. It has only been about a week and a half since I've last seen him I really did not expect this to reopen this quickly. Nonetheless I did discuss with him his normal routine in order to see what may be causing this. He has custom shoes. With that being said he apparently has not been using the custom shoes and even when he has used them he has not even put the insert in there which is what was custom molded to him in order to appropriately offload high-pressure point areas. In fact his exact question to me was whether or not he needed to actually put those in they just came loose in the box and he had never installed them. Nonetheless it also came to light the he's been walking around in his bedroom slippers at home he tells me he does not go barefoot but he really has not been wearing his custom offloading/diabetic shoes. Nonetheless I do believe that this is why he continually reopens as far as the wound is concerned. I discussed this with the patient in great detail today for yet again although I previously had this discussion with him in the past. 07/11/19-Patient returns at 1 week for his left great toe wound which is actually doing really well, he has been using the insert to offload the toe and is very pleased with it. This looks like this is working out really well for him 07/18/19 on evaluation today patient appears to be doing really about the same with regard to his ulcer on the right great toe. He's been tolerating the dressing changes without complication. Fortunately there's no signs of active infection at this time. With that being said I think that we're having a difficult time getting this to heal and I do believe that initiating treatment with the total contact cast to get this to close would be a good idea. The good news is he seems to be keeping this from worsening so hopefully that means that if we get it closed he can prevent this  from reopening in the future. At this point my suggestion was that we go ahead and initiate treatment at both sites utilizing collagen. We will then subsequently put a piece of silver out and it between the toes of the right foot in the webbing between the fourth and fifth toe in order to help keep things dry and allow this to hopefully close and we heal without complication. The patient is in agreement with plan. Subsequently I am hopeful that both wounds will heal quite quickly we have been very close now with the ankle and again the toe has reopened but fortunately doesn't appear to be to bad at this point. No antibiotics were prescribed today as they did not appear to be necessary. 07/25/19 on evaluation today patient actually appears to be doing excellent in regard to his great toe ulcer. In fact this really appears to be almost completely healed if indeed it is not in fact healed. With  that being said there is still the eighth small pinpoint area that could be open although I tend to doubt it. Nonetheless it is something that I would like to continue to watch for more week I do think it would be beneficial for [place] to go ahead and continue with the cast for one more week before deciding to discharge him especially in light of the issues he's had in the past. Specifically with regard to the wound reopening rather quickly. 08/01/2019 upon evaluation today patient actually appears to be doing well with regard to his great toe ulcer. He has been tolerating the dressing changes without complication. Fortunately we think appears to be completely healed at this time which is great news. I am very pleased with the progress up to this point. In fact he appears to be completely healed at this point. Readmission: 09/15/2019 patient presents today for reevaluation here in our clinic concerning issues that he is having with his left great toe as he did previous. We have not actually seen him since August 01, 2019. He had been doing very well until he noticed when walking into the bathroom with just the socks on the other night that he had a little bit of bleeding from the toe location. Subsequently he decided to make an appointment to come in as he did not want this to get significantly worse. There does not appear to be any signs of active infection he tells me as long as he was wearing his shoes he seemed to be doing okay is walking around not necessarily barefoot but just with socks on that I think may be his main problem at this time. 09/22/2019 on evaluation today patient actually appears to be doing quite well with regard to his great toe ulcer. This is making signs of good improvement even in the short amount of time we have been taking care of his toe since he came back. Is just been 1 week. Nonetheless the improvement in the wound size is excellent he also really has no depth and no evidence of infection which is great news. 09/29/2019 on evaluation today patient appears to be doing well with regard to his toe ulcer. This is showing signs of improvement in fact it was questioning whether or not today this actually was healed. However there did appear to be some callus covering the surface of the wound was removed there did appear to be a small pinpoint opening still in the middle of the wound although this is minimal. 10/06/2019 on evaluation today patient appears to be doing excellent in regard to his toe ulcer which actually showed signs of complete Epithelization today. There is no evidence of active infection which is good news. Overall he has been tolerating the cast without complication but I think he is done with that as of now. [name], [name] ([id number]) Patient History Information obtained from Patient. Family History Diabetes - Mother, Heart Disease - Father, Stroke - Father, No family history of Cancer, Hereditary Spherocytosis, Hypertension, Kidney Disease, Lung Disease,  Seizures, Thyroid Problems, Tuberculosis. Social History Never smoker, Marital Status - Widowed, Alcohol Use - Never, Drug Use - No History, Caffeine Use - Moderate. Medical History Eyes Denies history of Cataracts, Glaucoma, Optic Neuritis Ear/Nose/Mouth/Throat Denies history of Chronic sinus problems/congestion, Middle ear problems Hematologic/Lymphatic Denies history of Anemia, Hemophilia, Human Immunodeficiency Virus, Lymphedema Respiratory Denies history of Aspiration, Asthma, Chronic Obstructive Pulmonary Disease (COPD), Pneumothorax, Sleep Apnea, Tuberculosis Cardiovascular Patient has history of Hypertension Denies history  of Angina, Arrhythmia, Congestive Heart Failure, Coronary Artery Disease, Deep Vein Thrombosis, Hypotension, Myocardial Infarction, Peripheral Arterial Disease, Peripheral Venous Disease, Phlebitis, Vasculitis Gastrointestinal Denies history of Cirrhosis , Colitis, Crohn s, Hepatitis A, Hepatitis B, Hepatitis C Endocrine Patient has history of Type II Diabetes Denies history of Type I Diabetes Genitourinary Denies history of End Stage Renal Disease Immunological Denies history of Lupus Erythematosus, Raynaud s, Scleroderma Integumentary (Skin) Denies history of History of Burn, History of pressure wounds Musculoskeletal Denies history of Gout, Rheumatoid Arthritis, Osteoarthritis, Osteomyelitis Neurologic Denies history of Dementia, Neuropathy, Quadriplegia, Paraplegia, Seizure Disorder Oncologic Denies history of Received Chemotherapy, Received Radiation Review of Systems (ROS) Constitutional Symptoms (General Health) Denies complaints or symptoms of Fatigue, Fever, Chills, Marked Weight Change. Respiratory Denies complaints or symptoms of Chronic or frequent coughs, Shortness of Breath. Cardiovascular Denies complaints or symptoms of Chest pain, LE edema. Psychiatric Denies complaints or symptoms of Anxiety, Claustrophobia. [name], [name] ([id number]) Objective Constitutional Well-nourished and well-hydrated in no acute distress. Vitals Time Taken: 10:40 AM, Height: 72 in, Weight: 256 lbs, BMI: 34.7, Temperature: 97.9 F, Pulse: 87 bpm, Respiratory Rate: 18 breaths/min, [name] Pressure: 152/78 mmHg. Respiratory normal breathing without difficulty. Psychiatric this patient is able to make decisions and demonstrates good insight into disease process. Alert and Oriented x 3. pleasant and cooperative. General Notes: Patient's wound bed currently again showed signs of complete epithelialization he seems to be doing quite well which is good news. Overall very pleased. Integumentary (Hair, Skin) Wound #1 status is Healed - Epithelialized. Original cause of wound was Gradually Appeared. The wound is located on the [employer]. The wound measures 0cm length x 0cm width x 0cm depth; 0cm^2 area and 0cm^3 volume. There is no tunneling or undermining noted. There is a none present amount of drainage noted. The wound margin is flat and intact. There is no granulation within the wound bed. There is no necrotic tissue within the wound bed. 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 I would recommend at this point we discontinue wound care services since the patient seems to be doing so well he is in agreement with plan. 2. I am also going to recommend at this time that he continue to wear his offloading/custom shoes which hopefully will [name], [name]. ([id number]) continue to do well as far as appropriate offloading is concerned I also recommended a corn/callus pad be used over the plantar aspect of the toe to try to prevent any rubbing to  cause any additional openings. Patient will follow-up as needed if he has any other concerns. Electronic Signature(s) Signed: 10/06/2019 1:13:17 PM By: Worthy Keeler PA-C Entered By: Worthy Keeler on 10/06/2019 13:13:17 [place], [name] ([id number]) -------------------------------------------------------------------------------- ROS/PFSH Details Patient Name: [name] Date of Service: 10/06/2019 10:45 AM Medical Record Number: [id number] Patient Account Number: [id number] Date of Birth/Sex: 1946-07-08 (73 y.o. M) Treating RN: Montey Hora Primary Care Provider: Glendon Axe Other Clinician: Referring Provider: Glendon Axe Treating Provider/Extender: STONE III, HOYT Weeks in Treatment: 3 Information Obtained From Patient Constitutional Symptoms (General Health) Complaints and Symptoms: Negative for: Fatigue; Fever; Chills; Marked Weight Change Respiratory Complaints and Symptoms: Negative for: Chronic or frequent coughs; Shortness of Breath Medical History: Negative for: Aspiration; Asthma; Chronic Obstructive Pulmonary Disease (COPD);  Pneumothorax; Sleep Apnea; Tuberculosis Cardiovascular Complaints and Symptoms: Negative for: Chest pain; LE edema Medical History: Positive for: Hypertension Negative for: Angina; Arrhythmia; Congestive Heart Failure; Coronary Artery Disease; Deep Vein Thrombosis; Hypotension; Myocardial Infarction; Peripheral Arterial Disease; Peripheral Venous Disease; Phlebitis; Vasculitis Psychiatric Complaints and Symptoms: Negative for: Anxiety; Claustrophobia Eyes Medical History: Negative for: Cataracts; Glaucoma; Optic Neuritis Ear/Nose/Mouth/Throat Medical History: Negative for: Chronic sinus problems/congestion; Middle ear problems Hematologic/Lymphatic Medical History: Negative for: Anemia; Hemophilia; Human Immunodeficiency Virus; Lymphedema Gastrointestinal [name], [name] ([id number]) Medical  History: Negative for: Cirrhosis ; Colitis; Crohnos; Hepatitis A; Hepatitis B; Hepatitis C Endocrine Medical History: Positive for: Type II Diabetes Negative for: Type I Diabetes Time with diabetes: 1990 Treated with: Insulin [name] sugar tested every day: Yes Tested : Genitourinary Medical History: Negative for: End Stage Renal Disease Immunological Medical History: Negative for: Lupus Erythematosus; Raynaudos; Scleroderma Integumentary (Skin) Medical History: Negative for: History of Burn; History of pressure wounds Musculoskeletal Medical History: Negative for: Gout; Rheumatoid Arthritis; Osteoarthritis; Osteomyelitis Neurologic Medical History: Negative for: Dementia; Neuropathy; Quadriplegia; Paraplegia; Seizure Disorder Oncologic Medical History: Negative for: Received Chemotherapy; Received Radiation Immunizations Pneumococcal Vaccine: Received Pneumococcal Vaccination: Yes Implantable Devices None Family and Social History Cancer: No; Diabetes: Yes - Mother; Heart Disease: Yes - Father; Hereditary Spherocytosis: No; Hypertension: No; Kidney Disease: No; Lung Disease: No; Seizures: No; Stroke: Yes - Father; Thyroid Problems: No; Tuberculosis: No; Never smoker; Marital Status - Widowed; Alcohol Use: Never; Drug Use: No History; Caffeine Use: Moderate; Financial Concerns: No; Food, Clothing or Shelter Needs: No; Support System Lacking: No; Transportation Concerns: No Physician Affirmation I have reviewed and agree with the above information. KAHL, RUPLEY ([id number]) Electronic Signature(s) Signed: 10/06/2019 4:36:34 PM By: Montey Hora Signed: 10/06/2019 5:06:41 PM By: Worthy Keeler PA-C Entered By: Worthy Keeler on 10/06/2019 12:54:07 [place], [name] ([id number]) -------------------------------------------------------------------------------- SuperBill Details Patient Name: [name] Date of Service: 10/06/2019 Medical Record  Number: [id number] Patient Account Number: [id number] Date of Birth/Sex: September 30, 1946 (73 y.o. M) Treating RN: Montey Hora Primary Care Provider: Glendon Axe Other Clinician: Referring Provider: Glendon Axe Treating Provider/Extender: Melburn Hake, HOYT Weeks in Treatment: 3 Diagnosis Coding ICD-10 Codes Code Description E11.621 Type 2 diabetes mellitus with foot ulcer L97.522 Non-pressure chronic ulcer of other part of left foot with fat layer exposed I10 Essential (primary) hypertension E11.43 Type 2 diabetes mellitus with diabetic autonomic (poly)neuropathy Facility Procedures CPT4 Code: [id number] Description: 99213 - WOUND CARE VISIT-LEV 3 EST PT Modifier: Quantity: 1 Physician Procedures CPT4 Code: [id number] Description: [id number] - WC PHYS LEVEL 3 - EST PT ICD-10 Diagnosis Description E11.621 Type 2 diabetes mellitus with foot ulcer L97.522 Non-pressure chronic ulcer of other part of left foot with fat I10 Essential (primary) hypertension E11.43 Type 2 diabetes  mellitus with diabetic autonomic (poly)neuropa Modifier: layer exposed thy Quantity: 1 Electronic Signature(s) Signed: 10/06/2019 1:13:29 PM By: Worthy Keeler PA-C Entered By: Worthy Keeler on 10/06/2019 13:13:28

## 2019-10-06 NOTE — Progress Notes (Signed)
Fred, [name] ([id number]) Visit Report for 10/06/2019 Arrival Information Details Patient Name: [name], [name]. Date of Service: 10/06/2019 10:45 AM Medical Record Number: [id number] Patient Account Number: [id number] Date of Birth/Sex: 05/29/1946 (73 y.o. M) Treating RN: Montey Hora Primary Care Tashira Torre: Glendon Axe Other Clinician: Referring Amilliana Hayworth: Glendon Axe Treating Shaili Donalson/Extender: Melburn Hake, HOYT Weeks in Treatment: 3 Visit Information History Since Last Visit Added or deleted any medications: No Patient Arrived: Cane Any new allergies or adverse reactions: No Arrival Time: 10:41 Had a fall or experienced change in No Accompanied By: self activities of daily living that may affect Transfer Assistance: None risk of falls: Patient Identification Verified: Yes Signs or symptoms of abuse/neglect since last visito No Secondary Verification Process Completed: Yes Hospitalized since last visit: No Implantable device outside of the clinic excluding No cellular tissue based products placed in the center since last visit: Has Dressing in Place as Prescribed: Yes Pain Present Now: No Electronic Signature(s) Signed: 10/06/2019 4:30:42 PM By: Lorine Bears RCP, RRT, CHT Entered By: Lorine Bears on 10/06/2019 10:45:27 Schrupp, [name] ([id number]) -------------------------------------------------------------------------------- Clinic Level of Care Assessment Details Patient Name: [name] Date of Service: 10/06/2019 10:45 AM Medical Record Number: [id number] Patient Account Number: [id number] Date of Birth/Sex: Feb 07, 1946 (73 y.o. M) Treating RN: Montey Hora Primary Care Ama Mcmaster: Glendon Axe Other Clinician: Referring Leiby Pigeon: Glendon Axe Treating Jamarria Real/Extender: STONE III, HOYT Weeks in Treatment: 3 Clinic Level of Care Assessment Items TOOL 4 Quantity Score []  - Use when only an EandM  is performed on FOLLOW-UP visit 0 ASSESSMENTS - Nursing Assessment / Reassessment X - Reassessment of Co-morbidities (includes updates in patient status) 1 10 X- 1 5 Reassessment of Adherence to Treatment Plan ASSESSMENTS - Wound and Skin Assessment / Reassessment X - Simple Wound Assessment / Reassessment - one wound 1 5 []  - 0 Complex Wound Assessment / Reassessment - multiple wounds []  - 0 Dermatologic / Skin Assessment (not related to wound area) ASSESSMENTS - Focused Assessment []  - Circumferential Edema Measurements - multi extremities 0 []  - 0 Nutritional Assessment / Counseling / Intervention X- 1 5 Lower Extremity Assessment (monofilament, tuning fork, pulses) []  - 0 Peripheral Arterial Disease Assessment (using hand held doppler) ASSESSMENTS - Ostomy and/or Continence Assessment and Care []  - Incontinence Assessment and Management 0 []  - 0 Ostomy Care Assessment and Management (repouching, etc.) PROCESS - Coordination of Care X - Simple Patient / Family Education for ongoing care 1 15 []  - 0 Complex (extensive) Patient / Family Education for ongoing care X- 1 10 Staff obtains Programmer, systems, Records, Test Results / Process Orders []  - 0 Staff telephones HHA, Nursing Homes / Clarify orders / etc []  - 0 Routine Transfer to another Facility (non-emergent condition) []  - 0 Routine Hospital Admission (non-emergent condition) []  - 0 New Admissions / Biomedical engineer / Ordering NPWT, Apligraf, etc. []  - 0 Emergency Hospital Admission (emergent condition) X- 1 10 Simple Discharge Coordination HERSHELL, BOWMER ([id number]) []  - 0 Complex (extensive) Discharge Coordination PROCESS - Special Needs []  - Pediatric / Minor Patient Management 0 []  - 0 Isolation Patient Management []  - 0 Hearing / Language / Visual special needs []  - 0 Assessment of Community assistance (transportation, D/C planning, etc.) []  - 0 Additional assistance / Altered mentation []  -  0 Support Surface(s) Assessment (bed, cushion, seat, etc.) INTERVENTIONS - Wound Cleansing / Measurement X - Simple Wound Cleansing - one wound 1 5 []  - 0 Complex Wound Cleansing - multiple wounds  X- 1 5 Wound Imaging (photographs - any number of wounds) []  - 0 Wound Tracing (instead of photographs) X- 1 5 Simple Wound Measurement - one wound []  - 0 Complex Wound Measurement - multiple wounds INTERVENTIONS - Wound Dressings []  - Small Wound Dressing one or multiple wounds 0 []  - 0 Medium Wound Dressing one or multiple wounds []  - 0 Large Wound Dressing one or multiple wounds []  - 0 Application of Medications - topical []  - 0 Application of Medications - injection INTERVENTIONS - Miscellaneous []  - External ear exam 0 []  - 0 Specimen Collection (cultures, biopsies, blood, body fluids, etc.) []  - 0 Specimen(s) / Culture(s) sent or taken to Lab for analysis []  - 0 Patient Transfer (multiple staff / Civil Service fast streamer / Similar devices) []  - 0 Simple Staple / Suture removal (25 or less) []  - 0 Complex Staple / Suture removal (26 or more) []  - 0 Hypo / Hyperglycemic Management (close monitor of Blood Glucose) []  - 0 Ankle / Brachial Index (ABI) - do not check if billed separately X- 1 5 Vital Signs Deblanc, PRATHIK MAGOUIRK ([id number]) Has the patient been seen at the hospital within the last three years: Yes Total Score: 80 Level Of Care: New/Established - Level 3 Electronic Signature(s) Signed: 10/06/2019 4:36:34 PM By: Montey Hora Entered By: Montey Hora on 10/06/2019 11:57:00 [name] ([id number]) -------------------------------------------------------------------------------- Encounter Discharge Information Details Patient Name: [name]. Date of Service: 10/06/2019 10:45 AM Medical Record Number: [id number] Patient Account Number: [id number] Date of Birth/Sex: March 21, 1946 (73 y.o. M) Treating RN: Montey Hora Primary Care Leslye Puccini: Glendon Axe Other Clinician: Referring Damarkus Balis: Glendon Axe Treating Ayahna Solazzo/Extender: Melburn Hake, HOYT Weeks in Treatment: 3 Encounter Discharge Information Items Discharge Condition: Stable Ambulatory Status: Ambulatory Discharge Destination: Home Transportation: Private Auto Accompanied By: self Schedule Follow-up Appointment: No Clinical Summary of Care: Electronic Signature(s) Signed: 10/06/2019 11:57:45 AM By: Montey Hora Entered By: Montey Hora on 10/06/2019 11:57:45 Robben, [name] ([id number]) -------------------------------------------------------------------------------- Lower Extremity Assessment Details Patient Name: [name] Date of Service: 10/06/2019 10:45 AM Medical Record Number: [id number] Patient Account Number: [id number] Date of Birth/Sex: 10-14-1946 (73 y.o. M) Treating RN: Cornell Barman Primary Care Shiloh Southern: Glendon Axe Other Clinician: Referring Suhaylah Wampole: Glendon Axe Treating Elizabethann Lackey/Extender: STONE III, HOYT Weeks in Treatment: 3 Edema Assessment Assessed: [Left: No] [Right: No] Edema: [Left: N] [Right: o] Vascular Assessment Pulses: Dorsalis Pedis Palpable: [Left:Yes] Electronic Signature(s) Signed: 10/06/2019 4:44:03 PM By: Gretta Cool, BSN, RN, CWS, Kim RN, BSN Entered By: Gretta Cool, BSN, RN, CWS, Kim on 10/06/2019 11:01:08 [name] ([id number]) -------------------------------------------------------------------------------- [place] Details Patient Name: OSCAR, CARPENITO. Date of Service: 10/06/2019 10:45 AM Medical Record Number: [id number] Patient Account Number: [id number] Date of Birth/Sex: June 17, 1946 (73 y.o. M) Treating RN: Montey Hora Primary Care Airelle Everding: Glendon Axe Other Clinician: Referring Nhu Glasby: Glendon Axe Treating Bohdi Leeds/Extender: Melburn Hake, HOYT Weeks in Treatment: 3 Active Inactive Electronic Signature(s) Signed: 10/06/2019 4:36:34 PM By: Montey Hora Entered By: Montey Hora on 10/06/2019 11:15:47 Masley, [name] ([id number]) -------------------------------------------------------------------------------- Pain Assessment Details Patient Name: [name]. Date of Service: 10/06/2019 10:45 AM Medical Record Number: [id number] Patient Account Number: [id number] Date of Birth/Sex: 1946-11-13 (73 y.o. M) Treating RN: Montey Hora Primary Care Jaylissa Felty: Glendon Axe Other Clinician: Referring Jaksen Fiorella: Glendon Axe Treating Marlet Korte/Extender: STONE III, HOYT Weeks in Treatment: 3 Active Problems Location of Pain Severity and Description of Pain Patient Has Paino No Site Locations Pain Management and Medication Current Pain Management: Electronic Signature(s) Signed: 10/06/2019 4:30:42  PM By: Paulla Fore, RRT, CHT Signed: 10/06/2019 4:36:34 PM By: Montey Hora Entered By: Lorine Bears on 10/06/2019 10:45:34 [name] ([id number]) -------------------------------------------------------------------------------- Patient/Caregiver Education Details Patient Name: [name] Date of Service: 10/06/2019 10:45 AM Medical Record Number: [id number] Patient Account Number: [id number] Date of Birth/Gender: 1946-08-07 (73 y.o. M) Treating RN: Montey Hora Primary Care Physician: Glendon Axe Other Clinician: Referring Physician: Glendon Axe Treating Physician/Extender: Sharalyn Ink in Treatment: 3 Education Assessment Education Provided To: Patient Education Topics Provided Offloading: Handouts: Other: continued need for offloading Methods: Explain/Verbal Responses: State content correctly Electronic Signature(s) Signed: 10/06/2019 4:36:34 PM By: Montey Hora Entered By: Montey Hora on 10/06/2019 11:57:30 Pongratz, [name]  ([id number]) -------------------------------------------------------------------------------- Wound Assessment Details Patient Name: [name]. Date of Service: 10/06/2019 10:45 AM Medical Record Number: [id number] Patient Account Number: [id number] Date of Birth/Sex: [birth year] (73 y.o. M) Treating RN: Montey Hora Primary Care Julis Haubner: Glendon Axe Other Clinician: Referring Chayden Garrelts: Glendon Axe Treating Kapri Nero/Extender: STONE III, HOYT Weeks in Treatment: 3 Wound Status Wound Number: 1 Primary Etiology: Diabetic Wound/Ulcer of the Lower Extremity Wound Location: Left, Plantar Toe Great Wound Status: Healed - Epithelialized Wounding Event: Gradually Appeared Comorbid Hypertension, Type II Diabetes Date Acquired: 09/07/2019 History: Weeks Of Treatment: 3 Clustered Wound: No Photos Wound Measurements Length: (cm) 0 % Redu Width: (cm) 0 % Redu Depth: (cm) 0 Epithe Area: (cm) 0 Tunne Volume: (cm) 0 Under ction in Area: 100% ction in Volume: 100% lialization: Large (67-100%) ling: No mining: No Wound Description Classification: Grade 2 Wound Margin: Flat and Intact Exudate Amount: None Present Foul Odor After Cleansing: No Slough/Fibrino Yes Wound Bed Granulation Amount: None Present (0%) Exposed Structure Necrotic Amount: None Present (0%) Fascia Exposed: No Fat Layer (Subcutaneous Tissue) Exposed: No Tendon Exposed: No Muscle Exposed: No Joint Exposed: No Bone Exposed: No Electronic Signature(s) Signed: 10/06/2019 4:36:34 PM By: Montey Hora Entered By: Montey Hora on 10/06/2019 11:13:51 Burke, [name] ([id number]) [place], [name] ([id number]) -------------------------------------------------------------------------------- Vitals Details Patient Name: [name]. Date of Service: 10/06/2019 10:45 AM Medical Record Number: [id number] Patient Account Number: [id number] Date of Birth/Sex: 01-Feb-1946 (73 y.o.  M) Treating RN: Montey Hora Primary Care Demia Viera: Glendon Axe Other Clinician: Referring Yuritzy Zehring: Glendon Axe Treating Nicco Reaume/Extender: STONE III, HOYT Weeks in Treatment: 3 Vital Signs Time Taken: 10:40 Temperature (F): 97.9 Height (in): 72 Pulse (bpm): 87 Weight (lbs): 256 Respiratory Rate (breaths/min): 18 Body Mass Index (BMI): 34.7 Blood Pressure (mmHg): 152/78 Reference Range: 80 - 120 mg / dl Electronic Signature(s) Signed: 10/06/2019 4:30:42 PM By: Lorine Bears RCP, RRT, CHT Entered By: Lorine Bears on 10/06/2019 10:46:05

## 2019-11-27 DIAGNOSIS — Z0000 Encounter for general adult medical examination without abnormal findings: Secondary | ICD-10-CM | POA: Insufficient documentation

## 2019-11-28 ENCOUNTER — Encounter: Payer: Medicare Other | Attending: Physician Assistant | Admitting: Physician Assistant

## 2019-11-28 ENCOUNTER — Other Ambulatory Visit: Payer: Self-pay

## 2019-11-28 DIAGNOSIS — L97529 Non-pressure chronic ulcer of other part of left foot with unspecified severity: Secondary | ICD-10-CM | POA: Diagnosis present

## 2019-11-28 DIAGNOSIS — E1143 Type 2 diabetes mellitus with diabetic autonomic (poly)neuropathy: Secondary | ICD-10-CM | POA: Insufficient documentation

## 2019-11-28 DIAGNOSIS — L97522 Non-pressure chronic ulcer of other part of left foot with fat layer exposed: Secondary | ICD-10-CM | POA: Insufficient documentation

## 2019-11-28 DIAGNOSIS — Z8673 Personal history of transient ischemic attack (TIA), and cerebral infarction without residual deficits: Secondary | ICD-10-CM | POA: Diagnosis not present

## 2019-11-28 DIAGNOSIS — E11621 Type 2 diabetes mellitus with foot ulcer: Secondary | ICD-10-CM | POA: Insufficient documentation

## 2019-11-28 DIAGNOSIS — I10 Essential (primary) hypertension: Secondary | ICD-10-CM | POA: Insufficient documentation

## 2019-11-29 NOTE — Progress Notes (Signed)
ANGELL, GAINOUS ([id number]) Visit Report for 11/28/2019 Allergy List Details Patient Name: [name], [name]. Date of Service: 11/28/2019 9:45 AM Medical Record Number: [id number] Patient Account Number: [id number] Date of Birth/Sex: Dec 02, 1946 (73 y.o. M) Treating RN: Harold Barban Primary Care Veida Spira: Frazier Richards Other Clinician: Referring Oluwatimilehin Balfour: Referral, Self Treating Effie Wahlert/Extender: STONE III, HOYT Weeks in Treatment: 0 Allergies Active Allergies No Known Allergies Allergy Notes Electronic Signature(s) Signed: 11/29/2019 4:10:47 PM By: Harold Barban Entered By: Harold Barban on 11/28/2019 09:54:37 [place], [name] ([id number]) -------------------------------------------------------------------------------- Arrival Information Details Patient Name: [name] Date of Service: 11/28/2019 9:45 AM Medical Record Number: [id number] Patient Account Number: [id number] Date of Birth/Sex: 1946/05/17 (73 y.o. M) Treating RN: Harold Barban Primary Care Adream Parzych: Frazier Richards Other Clinician: Referring Crispin Vogel: Referral, Self Treating Cohan Stipes/Extender: Melburn Hake, HOYT Weeks in Treatment: 0 Visit Information Patient Arrived: Cane Arrival Time: 09:38 Accompanied By: self Transfer Assistance: None Patient Identification Verified: Yes Secondary Verification Process Completed: Yes History Since Last Visit Added or deleted any medications: No Any new allergies or adverse reactions: No Had a fall or experienced change in activities of daily living that may affect risk of falls: No Signs or symptoms of abuse/neglect since last visito No Hospitalized since last visit: No Has Dressing in Place as Prescribed: Yes Pain Present Now: No Electronic Signature(s) Signed: 11/29/2019 4:10:47 PM By: Harold Barban Entered By: Harold Barban on 11/28/2019 09:40:04 [name]  ([id number]) -------------------------------------------------------------------------------- Clinic Level of Care Assessment Details Patient Name: [name] Date of Service: 11/28/2019 9:45 AM Medical Record Number: [id number] Patient Account Number: [id number] Date of Birth/Sex: [birth year] (73 y.o. M) Treating RN: Montey Hora Primary Care Lucius Wise: Frazier Richards Other Clinician: Referring Tanicia Wolaver: Referral, Self Treating Eryka Dolinger/Extender: Melburn Hake, HOYT Weeks in Treatment: 0 Clinic Level of Care Assessment Items TOOL 1 Quantity Score []  - Use when EandM and Procedure is performed on INITIAL visit 0 ASSESSMENTS - Nursing Assessment / Reassessment X - General Physical Exam (combine w/ comprehensive assessment (listed just below) when 1 20 performed on new pt. evals) X- 1 25 Comprehensive Assessment (HX, ROS, Risk Assessments, Wounds Hx, etc.) ASSESSMENTS - Wound and Skin Assessment / Reassessment []  - Dermatologic / Skin Assessment (not related to wound area) 0 ASSESSMENTS - Ostomy and/or Continence Assessment and Care []  - Incontinence Assessment and Management 0 []  - 0 Ostomy Care Assessment and Management (repouching, etc.) PROCESS - Coordination of Care X - Simple Patient / Family Education for ongoing care 1 15 []  - 0 Complex (extensive) Patient / Family Education for ongoing care X- 1 10 Staff obtains Programmer, systems, Records, Test Results / Process Orders []  - 0 Staff telephones HHA, Nursing Homes / Clarify orders / etc []  - 0 Routine Transfer to another Facility (non-emergent condition) []  - 0 Routine Hospital Admission (non-emergent condition) X- 1 15 New Admissions / Biomedical engineer / Ordering NPWT, Apligraf, etc. []  - 0 Emergency Hospital Admission (emergent condition) PROCESS - Special Needs []  - Pediatric / Minor Patient Management 0 []  - 0 Isolation Patient Management []  - 0 Hearing / Language / Visual special needs []  -  0 Assessment of Community assistance (transportation, D/C planning, etc.) []  - 0 Additional assistance / Altered mentation []  - 0 Support Surface(s) Assessment (bed, cushion, seat, etc.) RUPPERT, MCLENNON ([id number]) INTERVENTIONS - Miscellaneous []  - External ear exam 0 []  - 0 Patient Transfer (multiple staff / Civil Service fast streamer / Similar devices) []  - 0 Simple Staple / Suture removal (25 or less) []  -  0 Complex Staple / Suture removal (26 or more) []  - 0 Hypo/Hyperglycemic Management (do not check if billed separately) []  - 0 Ankle / Brachial Index (ABI) - do not check if billed separately Has the patient been seen at the hospital within the last three years: Yes Total Score: 85 Level Of Care: New/Established - Level 3 Electronic Signature(s) Signed: 11/28/2019 4:29:10 PM By: Montey Hora Entered By: Montey Hora on 11/28/2019 10:31:23 [name] ([id number]) -------------------------------------------------------------------------------- Encounter Discharge Information Details Patient Name: [name]. Date of Service: 11/28/2019 9:45 AM Medical Record Number: [id number] Patient Account Number: [id number] Date of Birth/Sex: August 10, 1946 (73 y.o. M) Treating RN: Montey Hora Primary Care Summerlyn Fickel: Frazier Richards Other Clinician: Referring Martia Dalby: Referral, Self Treating Vola Beneke/Extender: Melburn Hake, HOYT Weeks in Treatment: 0 Encounter Discharge Information Items Post Procedure Vitals Discharge Condition: Stable Temperature (F): 98.3 Ambulatory Status: Cane Pulse (bpm): 79 Discharge Destination: Home Respiratory Rate (breaths/min): 16 Transportation: Private Auto Blood Pressure (mmHg): 140/82 Accompanied By: self Schedule Follow-up Appointment: Yes Clinical Summary of Care: Electronic Signature(s) Signed: 11/28/2019 4:29:10 PM By: Montey Hora Entered By: Montey Hora on 11/28/2019 10:36:55 [place], [name]  ([id number]) -------------------------------------------------------------------------------- Lower Extremity Assessment Details Patient Name: [name]. Date of Service: 11/28/2019 9:45 AM Medical Record Number: [id number] Patient Account Number: [id number] Date of Birth/Sex: [birth year] (73 y.o. M) Treating RN: Harold Barban Primary Care Rivka Baune: Frazier Richards Other Clinician: Referring Romesha Scherer: Referral, Self Treating Imogene Gravelle/Extender: Melburn Hake, HOYT Weeks in Treatment: 0 Edema Assessment Assessed: [Left: No] [Right: No] [Left: Edema] [Right: :] Calf Left: Right: Point of Measurement: 34 cm From Medial Instep 38 cm 39 cm Ankle Left: Right: Point of Measurement: 12 cm From Medial Instep 23.5 cm 23 cm Vascular Assessment Pulses: Dorsalis Pedis Palpable: [Left:Yes] [Right:Yes] Posterior Tibial Palpable: [Left:Yes] [Right:Yes] Electronic Signature(s) Signed: 11/29/2019 4:10:47 PM By: Harold Barban Entered By: Harold Barban on 11/28/2019 09:58:17 [place], [name] ([id number]) -------------------------------------------------------------------------------- Multi Wound Chart Details Patient Name: [name]. Date of Service: 11/28/2019 9:45 AM Medical Record Number: [id number] Patient Account Number: [id number] Date of Birth/Sex: Jun 13, 1946 (73 y.o. M) Treating RN: Montey Hora Primary Care Cyncere Ruhe: Frazier Richards Other Clinician: Referring Korvin Valentine: Referral, Self Treating Khaliq Turay/Extender: Melburn Hake, HOYT Weeks in Treatment: 0 Vital Signs Height(in): 72 Pulse(bpm): 41 Weight(lbs): 254 Blood Pressure(mmHg): 140/82 Body Mass Index(BMI): 34 Temperature(F): 98.3 Respiratory Rate 18 (breaths/min): Photos: [N/A:N/A] Wound Location: Left Toe Great N/A N/A Wounding Event: Gradually Appeared N/A N/A Primary Etiology: Diabetic Wound/Ulcer of the N/A N/A Lower Extremity Comorbid History: Hypertension, Type II Diabetes N/A  N/A Date Acquired: 11/14/2019 N/A N/A Weeks of Treatment: 0 N/A N/A Wound Status: Open N/A N/A Measurements L x W x D 0.2x0.4x0.6 N/A N/A (cm) Area (cm) : 0.063 N/A N/A Volume (cm) : 0.038 N/A N/A % Reduction in Area: 0.00% N/A N/A % Reduction in Volume: 0.00% N/A N/A Starting Position 1 9 (o'clock): Ending Position 1 3 (o'clock): Maximum Distance 1 (cm): 0.5 Undermining: Yes N/A N/A Classification: Grade 1 N/A N/A Exudate Amount: Medium N/A N/A Exudate Type: Serosanguineous N/A N/A Exudate Color: red, brown N/A N/A Wound Margin: Flat and Intact N/A N/A Granulation Amount: None Present (0%) N/A N/A Necrotic Amount: Large (67-100%) N/A N/A Necrotic Tissue: Eschar, Adherent Slough N/A N/A Exposed Structures: N/A N/A KIYAAN, BLANKENBILLER ([id number]) Fat Layer (Subcutaneous Tissue) Exposed: Yes Fascia: No Tendon: No Muscle: No Joint: No Bone: No Epithelialization: None N/A N/A Treatment Notes Electronic Signature(s) Signed: 11/28/2019 4:29:10 PM By: Montey Hora Entered By: Montey Hora  on 11/28/2019 10:30:50 HAGAN, BARCELO ([id number]) -------------------------------------------------------------------------------- [place] Details Patient Name: KAYDON, WITTLER. Date of Service: 11/28/2019 9:45 AM Medical Record Number: [id number] Patient Account Number: [id number] Date of Birth/Sex: [birth year] (73 y.o. M) Treating RN: Montey Hora Primary Care Rockie Schnoor: Frazier Richards Other Clinician: Referring Ehtan Delfavero: Referral, Self Treating Darriel Sinquefield/Extender: Melburn Hake, HOYT Weeks in Treatment: 0 Active Inactive Abuse / Safety / Falls / Self Care Management Nursing Diagnoses: Potential for falls Goals: Patient will not experience any injury related to falls Date Initiated: 11/28/2019 Target Resolution Date: 03/09/2020 Goal Status: Active Interventions: Assess fall risk on admission and as needed Notes: Necrotic Tissue Nursing  Diagnoses: Impaired tissue integrity related to necrotic/devitalized tissue Goals: Necrotic/devitalized tissue will be minimized in the wound bed Date Initiated: 11/28/2019 Target Resolution Date: 03/09/2020 Goal Status: Active Interventions: Provide education on necrotic tissue and debridement process Notes: Nutrition Nursing Diagnoses: Potential for alteratiion in Nutrition/Potential for imbalanced nutrition Goals: Patient/caregiver agrees to and verbalizes understanding of need to use nutritional supplements and/or vitamins as prescribed Date Initiated: 11/28/2019 Target Resolution Date: 03/09/2020 Goal Status: Active Interventions: Assess patient nutrition upon admission and as needed per policy KENYETTA, GROSECLOSE ([id number]) Notes: Orientation to the Wound Care Program Nursing Diagnoses: Knowledge deficit related to the wound healing center program Goals: Patient/caregiver will verbalize understanding of the [facility] Program Date Initiated: 11/28/2019 Target Resolution Date: 03/09/2020 Goal Status: Active Interventions: Provide education on orientation to the wound center Notes: Wound/Skin Impairment Nursing Diagnoses: Impaired tissue integrity Goals: Ulcer/skin breakdown will heal within 14 weeks Date Initiated: 11/28/2019 Target Resolution Date: 03/09/2020 Goal Status: Active Interventions: Assess patient/caregiver ability to obtain necessary supplies Assess patient/caregiver ability to perform ulcer/skin care regimen upon admission and as needed Assess ulceration(s) every visit Notes: Electronic Signature(s) Signed: 11/28/2019 4:29:10 PM By: Montey Hora Entered By: Montey Hora on 11/28/2019 10:32:36 [name] ([id number]) -------------------------------------------------------------------------------- Pain Assessment Details Patient Name: [name]. Date of Service: 11/28/2019 9:45 AM Medical Record Number:  [id number] Patient Account Number: [id number] Date of Birth/Sex: [birth year] (73 y.o. M) Treating RN: Harold Barban Primary Care Kali Deadwyler: Frazier Richards Other Clinician: Referring Lovett Coffin: Referral, Self Treating Rendi Mapel/Extender: Melburn Hake, HOYT Weeks in Treatment: 0 Active Problems Location of Pain Severity and Description of Pain Patient Has Paino No Site Locations Pain Management and Medication Current Pain Management: Electronic Signature(s) Signed: 11/29/2019 4:10:47 PM By: Harold Barban Entered By: Harold Barban on 11/28/2019 09:41:20 [place] ([id number]) -------------------------------------------------------------------------------- Patient/Caregiver Education Details Patient Name: [name]. Date of Service: 11/28/2019 9:45 AM Medical Record Number: [id number] Patient Account Number: [id number] Date of Birth/Gender: 19-May-1946 (73 y.o. M) Treating RN: Montey Hora Primary Care Physician: Frazier Richards Other Clinician: Referring Physician: Referral, Self Treating Physician/Extender: Sharalyn Ink in Treatment: 0 Education Assessment Education Provided To: Patient Education Topics Provided Offloading: Handouts: Other: need for ongoing offloading Methods: Explain/Verbal Responses: State content correctly Electronic Signature(s) Signed: 11/28/2019 4:29:10 PM By: Montey Hora Entered By: Montey Hora on 11/28/2019 10:32:06 [name] ([id number]) -------------------------------------------------------------------------------- Wound Assessment Details Patient Name: [name]. Date of Service: 11/28/2019 9:45 AM Medical Record Number: [id number] Patient Account Number: [id number] Date of Birth/Sex: 12-07-1946 (73 y.o. M) Treating RN: Montey Hora Primary Care Taira Knabe: Frazier Richards Other Clinician: Referring Tashena Ibach: Referral, Self Treating Mikeila Burgen/Extender: Melburn Hake, HOYT Weeks  in Treatment: 0 Wound Status Wound Number: 1 Primary Etiology: Diabetic Wound/Ulcer of the Lower Extremity Wound Location: Left Toe Great Wound Status: Open Wounding Event: Gradually Appeared Comorbid Hypertension, Type  II Diabetes Date Acquired: 11/14/2019 History: Weeks Of Treatment: 0 Clustered Wound: No Photos Wound Measurements Length: (cm) 0.2 % Reduction i Width: (cm) 0.4 % Reduction i Depth: (cm) 0.6 Epithelializa Area: (cm) 0.063 Tunneling: Volume: (cm) 0.038 Undermining: Starting P Ending Pos Maximum Di n Area: 0% n Volume: 0% tion: None No Yes osition (o'clock): 9 ition (o'clock): 3 stance: (cm) 0.5 Wound Description Classification: Grade 1 Foul Odor Aft Wound Margin: Flat and Intact Slough/Fibrin Exudate Amount: Medium Exudate Type: Serosanguineous Exudate Color: red, brown er Cleansing: No o Yes Wound Bed Granulation Amount: None Present (0%) Exposed Structure Necrotic Amount: Large (67-100%) Fascia Exposed: No Necrotic Quality: Eschar, Adherent Slough Fat Layer (Subcutaneous Tissue) Exposed: Yes Tendon Exposed: No Muscle Exposed: No Joint Exposed: No ZYEL, LAURIANO ([id number]) Bone Exposed: No Treatment Notes Wound #1 (Left Toe Great) Notes silvercel, foam and conform Electronic Signature(s) Signed: 11/28/2019 4:29:10 PM By: Montey Hora Signed: 11/29/2019 4:10:47 PM By: Harold Barban Previous Signature: 11/28/2019 10:14:40 AM Version By: Montey Hora Entered By: Harold Barban on 11/28/2019 10:23:51 [place], [name] ([id number]) -------------------------------------------------------------------------------- Vitals Details Patient Name: [name]. Date of Service: 11/28/2019 9:45 AM Medical Record Number: [id number] Patient Account Number: [id number] Date of Birth/Sex: 11-30-1946 (73 y.o. M) Treating RN: Harold Barban Primary Care Marlina Cataldi: Frazier Richards Other Clinician: Referring Janyiah Silveri:  Referral, Self Treating Jacksyn Beeks/Extender: Melburn Hake, HOYT Weeks in Treatment: 0 Vital Signs Time Taken: 09:40 Temperature (F): 98.3 Height (in): 72 Pulse (bpm): 79 Source: Stated Respiratory Rate (breaths/min): 18 Weight (lbs): 254 Blood Pressure (mmHg): 140/82 Source: Stated Reference Range: 80 - 120 mg / dl Body Mass Index (BMI): 34.4 Electronic Signature(s) Signed: 11/29/2019 4:10:47 PM By: Harold Barban Entered By: Harold Barban on 11/28/2019 09:45:56

## 2019-11-29 NOTE — Progress Notes (Signed)
OBET, BYFORD ([id number]) Visit Report for 11/28/2019 Chief Complaint Document Details Patient Name: [name], [name]. Date of Service: 11/28/2019 9:45 AM Medical Record Number: [id number] Patient Account Number: [id number] Date of Birth/Sex: 07-May-1946 (73 y.o. M) Treating RN: Montey Hora Primary Care Provider: Frazier Richards Other Clinician: Referring Provider: Referral, Self Treating Provider/Extender: Melburn Hake, Susan Bleich Weeks in Treatment: 0 Information Obtained from: Patient Chief Complaint Left great toe ulcer Electronic Signature(s) Signed: 11/28/2019 5:47:36 PM By: Worthy Keeler PA-C Entered By: Worthy Keeler on 11/28/2019 10:25:26 [name] ([id number]) -------------------------------------------------------------------------------- Debridement Details Patient Name: [name]. Date of Service: 11/28/2019 9:45 AM Medical Record Number: [id number] Patient Account Number: [id number] Date of Birth/Sex: 05/31/1946 (73 y.o. M) Treating RN: Montey Hora Primary Care Provider: Frazier Richards Other Clinician: Referring Provider: Referral, Self Treating Provider/Extender: Melburn Hake, Yasamin Karel Weeks in Treatment: 0 Debridement Performed for Wound #1 Left Toe Great Assessment: Performed By: Physician STONE III, Julliana Whitmyer E., PA-C Debridement Type: Debridement Severity of Tissue Pre Fat layer exposed Debridement: Level of Consciousness (Pre- Awake and Alert procedure): Pre-procedure Verification/Time Yes - 10:29 Out Taken: Start Time: 10:29 Pain Control: Lidocaine 4% Topical Solution Total Area Debrided (L x W): 0.2 (cm) x 0.4 (cm) = 0.08 (cm) Tissue and other material Viable, Non-Viable, Callus, Slough, Subcutaneous, Slough debrided: Level: Skin/Subcutaneous Tissue Debridement Description: Excisional Instrument: Curette Bleeding: Minimum Hemostasis Achieved: Pressure End Time: 10:34 Procedural Pain: 0 Post Procedural Pain:  0 Response to Treatment: Procedure was tolerated well Level of Consciousness Awake and Alert (Post-procedure): Post Debridement Measurements of Total Wound Length: (cm) 0.5 Width: (cm) 0.5 Depth: (cm) 0.3 Volume: (cm) 0.059 Character of Wound/Ulcer Post Debridement: Improved Severity of Tissue Post Debridement: Fat layer exposed Post Procedure Diagnosis Same as Pre-procedure Electronic Signature(s) Signed: 11/28/2019 4:29:10 PM By: Montey Hora Signed: 11/28/2019 5:47:36 PM By: Worthy Keeler PA-C Entered By: Montey Hora on 11/28/2019 10:34:37 [name] ([id number]) -------------------------------------------------------------------------------- HPI Details Patient Name: [name] Date of Service: 11/28/2019 9:45 AM Medical Record Number: [id number] Patient Account Number: [id number] Date of Birth/Sex: [birth year] (73 y.o. M) Treating RN: Montey Hora Primary Care Provider: Frazier Richards Other Clinician: Referring Provider: Referral, Self Treating Provider/Extender: Melburn Hake, Marigny Borre Weeks in Treatment: 0 History of Present Illness HPI Description: 12/12/18 on evaluation today patient presents as a referral from his endocrinologist regarding issues that he has been having with his left great toe. Subsequently he has been seeing Dr. Cleda Mccreedy and Dr. Cleda Mccreedy has been the breeding the wound and in fact this was debrided this morning. The patient had an appointment there prior to coming here. Subsequently he states that even though he's been going for regular debridement after office things really do not seem to be showing signs of improvement unfortunately. He states that he is having some discomfort but nothing too significant. No fevers, chills, nausea, or vomiting noted at this time. The patient does have a history of hypertension, neuropathy, diabetes mellitus type II, and a history of stroke. Currently he's been using bacitracin on the toe and  utilizing a Band-Aid. He tells me he's had this wound for two years. He cannot remember the last time he had an x-ray. 12/19/18 on evaluation today patient appears to be doing well in regard to his plantar toe ulcer. He's been tolerating the dressing changes without complication. Fortunately there does not appear to be signs of infection at this time which is good news. No fever chills noted. 01/19/19 has been one month since I last  saw the patient as he was out of town. With that being said on evaluation today it does appear that he is going to require sharp debridement and I do believe that the total contact cast would benefit him as far as being applied at this time. He is in agreement that plan. 01/23/19 on evaluation today patient appears to be doing better in regard to his left great toe ulcer. He has been shown signs of improvement week by week and although this is slow it does seem to be doing fairly well which is good news. Overall very pleased in this regard. 01/31/19 on evaluation today patient presents for follow-up concerning his great toe ulcer. He was actually supposed to be here yesterday and he did come however he ended up having to leave due to his blood sugar dropping he states he just did not eat enough lunch. Fortunately seems to be doing much better today which is excellent news. Fortunately there's no evidence of infection at this time which is also good news. No fevers, chills, nausea, or vomiting noted at this time. Overall I feel like he is making excellent progress. 02/06/19 on evaluation today patient actually appears to be doing very well in regard to his plantar toe ulcer. He's been tolerating the dressing changes without complication. Fortunately there is no sign of infection at this time. Overall been very pleased with how things seem to be progressing. No fevers, chills, nausea, or vomiting noted at this time. 02/13/19 on evaluation today patient actually appears to be  doing excellent in regard to his toe ulcer which is almost completely close. Overall very pleased with that. There does not appear to be any signs of infection which is good news. Upon close inspection it appears that he has just a very slight opening still noted at the central portion of the toe although this is minimal. 02/16/19 on evaluation today patient is seen for early follow-up due to the fact that he tells me while at home he actually got up to go answer the door without putting on the walking boot part of his cast and subsequently damaged the total contact cast. It therefore comes in to have this removed and potentially replaced. Fortunately other than it given him a little bit of pain its far as pushing in on some areas there doesn't appear to be any injury once the cast was removed. 03/13/19 on evaluation today patient unfortunately presents for follow-up concerning his great toe ulcer on the left. He was discharged on 02/16/19 with the wound healed at that point. He tells me this remain so for several weeks or least a couple weeks before reopening. Fortunately there does not appear to be any signs of infection at this time. Nonetheless for the past week at least he's had the wound reopened and states it is been trying to keep pressure off of this but he has not been using the offloading shoe we previously gave him. Fortunately there's no evidence of infection at this time. No fevers, chills, nausea, or vomiting noted at this time. [name], [name] ([id number]) 03/17/19 on evaluation today patient actually appears to be doing rather well in regard to his toe also. Fact this appears to be significantly smaller this week even compared to what I saw last week on evaluation. He seems to have done very well for the offloading shoe and overall I'm very pleased with the progress that is made. It may be that he doesn't even need the cast to  get this to heal if he offloads this  appropriately. 03/24/19 on evaluation today patient actually appears to be doing well in regard to his plantar foot ulcer. He's been tolerating the dressing changes without complication the collagen does seem to be beneficial for him. With that being said he is gonna require some miles sharp debridement today to clear away some of the callous's around the edge of the wound as well as the minimal Slough noted on the surface of the wound. 03/31/19 on evaluation today patient appears to be doing well in regard to his plantar great toe ulcer. He has been tolerating the dressing changes without complication which is good news. Fortunately there does not appear to be any signs of active infection at this time. Overall very pleased with how things seem to be progressing. 04/11/19 on evaluation today patient's tools are appears to be doing in my pinion roughly about the same as were things that previous. Fortunately there does not appear to be signs of active infection at this time which is good news. With that being said he has not been experiencing any pain at this time which is good news there is also No fevers, chills, nausea, or vomiting noted at this time. 04/18/19 on evaluation today patient's wound actually appears to be doing fairly well today he did not even really have a lot of callous buildup. We have been debating on whether or not to reinitiate total contact cast. With that being said he seems to be doing fairly well this point with offloading in my pinion and again I think we need to come up with a way to get this healed that will also be sustainable for keeping it close. Again we were able to close it with the cast previous but again he has to be able to keep it such. For that reason we are working on trying to get him diabetic shoes he's waiting for the [place] [employer] to get in touch with the local company that has to measure him for these do not want to have a cast on at such a time as when they need  to actually measure him. 04/25/19 on evaluation today patient appears to be doing well in regard to his plantar foot specifically great toe ulcer. He's been tolerating the dressing changes without complication. Fortunately there's no signs of active infection at this time. Overall I'm very pleased with how things appear currently. 05/02/19 on evaluation today patient appears to actually be doing better in regard to his plantar foot ulcer. He shown signs of good improvement which is excellent news. She states he can walk better with the cast on the can with the offloading shoe that he had on previous. Fortunately there's no signs of active infection at this time. No fevers, chills, nausea, or vomiting noted at this time. 05/09/19 on evaluation today patient actually appears to be doing excellent. In fact he appears to be completely healed based on evaluation at this time. Fortunately there's no signs of infection and is having no discomfort. 06/08/19 this is a follow-up visit although the patient has been healed for almost a month but not quite. He tells me that in the past several days he noted some blood coming from his toe and he thought he should come in at this checked out. Fortunately he's having a pain. He also did get his custom orthotics shoes. With that being said he still appears to have developed this ulceration there is something he tells me that  the orthotic specialist stated they could do to the toes to try to help out in this regard although he did not know exactly what it was we may need to check with [facility]. 06/12/19 on evaluation today patient actually appears to be doing great in regard to his foot ulcer. The wound on his toes shown signs of excellent improvement which is great news. With that being said he is not completely close yet the cast has done wonders for him and he is definitely headed in the correct direction. No fevers, chills, nausea, or vomiting noted at  this time. 06/22/19 on evaluation today fortunately patient actually appears to be healing quite well and in fact appears to be completely healed this is excellent news. Fortunately there's no signs of active infection he's been tolerating the total contact cast without complication. Overall I'm pleased with the progress that has been made. He also has his new custom shoes he brought was with him today. 07/04/19 on evaluation today patient actually appears to be doing a little bit worse in regard his toe ulcer unfortunately this has yet again reopened. It has only been about a week and a half since I've last seen him I really did not expect this to reopen this quickly. Nonetheless I did discuss with him his normal routine in order to see what may be causing this. He has custom shoes. With that being said he apparently has not been using the custom shoes and even when he has used them he has not [name], [name]. ([id number]) even put the insert in there which is what was custom molded to him in order to appropriately offload high-pressure point areas. In fact his exact question to me was whether or not he needed to actually put those in they just came loose in the box and he had never installed them. Nonetheless it also came to light the he's been walking around in his bedroom slippers at home he tells me he does not go barefoot but he really has not been wearing his custom offloading/diabetic shoes. Nonetheless I do believe that this is why he continually reopens as far as the wound is concerned. I discussed this with the patient in great detail today for yet again although I previously had this discussion with him in the past. 07/11/19-Patient returns at 1 week for his left great toe wound which is actually doing really well, he has been using the insert to offload the toe and is very pleased with it. This looks like this is working out really well for him 07/18/19 on evaluation today patient  appears to be doing really about the same with regard to his ulcer on the right great toe. He's been tolerating the dressing changes without complication. Fortunately there's no signs of active infection at this time. With that being said I think that we're having a difficult time getting this to heal and I do believe that initiating treatment with the total contact cast to get this to close would be a good idea. The good news is he seems to be keeping this from worsening so hopefully that means that if we get it closed he can prevent this from reopening in the future. At this point my suggestion was that we go ahead and initiate treatment at both sites utilizing collagen. We will then subsequently put a piece of silver out and it between the toes of the right foot in the webbing between the fourth and fifth toe in order  to help keep things dry and allow this to hopefully close and we heal without complication. The patient is in agreement with plan. Subsequently I am hopeful that both wounds will heal quite quickly we have been very close now with the ankle and again the toe has reopened but fortunately doesn't appear to be to bad at this point. No antibiotics were prescribed today as they did not appear to be necessary. 07/25/19 on evaluation today patient actually appears to be doing excellent in regard to his great toe ulcer. In fact this really appears to be almost completely healed if indeed it is not in fact healed. With that being said there is still the eighth small pinpoint area that could be open although I tend to doubt it. Nonetheless it is something that I would like to continue to watch for more week I do think it would be beneficial for [place] to go ahead and continue with the cast for one more week before deciding to discharge him especially in light of the issues he's had in the past. Specifically with regard to the wound reopening rather quickly. 08/01/2019 upon evaluation today patient  actually appears to be doing well with regard to his great toe ulcer. He has been tolerating the dressing changes without complication. Fortunately we think appears to be completely healed at this time which is great news. I am very pleased with the progress up to this point. In fact he appears to be completely healed at this point. Readmission: 09/15/2019 patient presents today for reevaluation here in our clinic concerning issues that he is having with his left great toe as he did previous. We have not actually seen him since August 01, 2019. He had been doing very well until he noticed when walking into the bathroom with just the socks on the other night that he had a little bit of bleeding from the toe location. Subsequently he decided to make an appointment to come in as he did not want this to get significantly worse. There does not appear to be any signs of active infection he tells me as long as he was wearing his shoes he seemed to be doing okay is walking around not necessarily barefoot but just with socks on that I think may be his main problem at this time. 09/22/2019 on evaluation today patient actually appears to be doing quite well with regard to his great toe ulcer. This is making signs of good improvement even in the short amount of time we have been taking care of his toe since he came back. Is just been 1 week. Nonetheless the improvement in the wound size is excellent he also really has no depth and no evidence of infection which is great news. 09/29/2019 on evaluation today patient appears to be doing well with regard to his toe ulcer. This is showing signs of improvement in fact it was questioning whether or not today this actually was healed. However there did appear to be some callus covering the surface of the wound was removed there did appear to be a small pinpoint opening still in the middle of the wound although this is minimal. 10/06/2019 on evaluation today patient appears  to be doing excellent in regard to his toe ulcer which actually showed signs of complete Epithelization today. There is no evidence of active infection which is good news. Overall he has been tolerating the cast without complication but I think he is done with that as of now. Readmission: 11/28/2019  on evaluation today patient appears to be unfortunately here today again for another issue with his left great toe. He has been having some significant problems intermittently over the past year or really that I been seeing him. Were able to heal him and then subsequently things will get worse yet again. With that being said the patient tells me at this time that he [name], [name]. ([id number]) began to have issues with drainage noted 2 weeks ago. He has no pain but again he is never really had any discomfort. No fevers, chills, nausea, vomiting, or diarrhea. He has been using his offloading shoes he tells me. Electronic Signature(s) Signed: 11/28/2019 5:47:36 PM By: Worthy Keeler PA-C Entered By: Worthy Keeler on 11/28/2019 10:47:47 [name], [name] ([id number]) -------------------------------------------------------------------------------- Physical Exam Details Patient Name: [name] Date of Service: 11/28/2019 9:45 AM Medical Record Number: [id number] Patient Account Number: [id number] Date of Birth/Sex: 03-17-1946 (73 y.o. M) Treating RN: Montey Hora Primary Care Provider: Frazier Richards Other Clinician: Referring Provider: Referral, Self Treating Provider/Extender: Melburn Hake, Nyajah Hyson Weeks in Treatment: 0 Constitutional sitting or standing blood pressure is within target range for patient.. pulse regular and within target range for patient.Marland Kitchen respirations regular, non-labored and within target range for patient.Marland Kitchen temperature within target range for patient.. Well- nourished and well-hydrated in no acute distress. Eyes conjunctiva clear no eyelid edema  noted. pupils equal round and reactive to light and accommodation. Ears, Nose, Mouth, and Throat no gross abnormality of ear auricles or external auditory canals. normal hearing noted during conversation. mucus membranes moist. Respiratory normal breathing without difficulty. clear to auscultation bilaterally. Cardiovascular regular rate and rhythm with normal S1, S2. 2+ dorsalis pedis/posterior tibialis pulses. no clubbing, cyanosis, significant edema, <3 sec cap refill. Gastrointestinal (GI) soft, non-tender, non-distended, +BS. no ventral hernia noted. Musculoskeletal normal gait and posture. no significant deformity or arthritic changes, no loss or range of motion, no clubbing. Psychiatric this patient is able to make decisions and demonstrates good insight into disease process. Alert and Oriented x 3. pleasant and cooperative. Notes Upon inspection patient's wound bed actually showed signs of good granulation at this time. There was however significant callus buildup around the edge of the wound and there was some slough noted as well. Actually did perform sharp debridement to clear away some of the necrotic tissue from the surface of the wound down to good subcutaneous tissue. Also cleared away the callus from around the edges of the wound. Patient tolerated all of this today without complication and post debridement the wound bed appears to be doing much better which is great news. Electronic Signature(s) Signed: 11/28/2019 5:47:36 PM By: Worthy Keeler PA-C Entered By: Worthy Keeler on 11/28/2019 10:48:35 Peck, [name] ([id number]) -------------------------------------------------------------------------------- Physician Orders Details Patient Name: [name] Date of Service: 11/28/2019 9:45 AM Medical Record Number: [id number] Patient Account Number: [id number] Date of Birth/Sex: 23-Nov-1946 (73 y.o. M) Treating RN: Montey Hora Primary Care Provider:  Frazier Richards Other Clinician: Referring Provider: Referral, Self Treating Provider/Extender: Melburn Hake, Arvle Grabe Weeks in Treatment: 0 Verbal / Phone Orders: No Diagnosis Coding ICD-10 Coding Code Description E11.621 Type 2 diabetes mellitus with foot ulcer L97.522 Non-pressure chronic ulcer of other part of left foot with fat layer exposed I10 Essential (primary) hypertension E11.43 Type 2 diabetes mellitus with diabetic autonomic (poly)neuropathy Wound Cleansing Wound #1 Left Toe Great o May Shower, gently pat wound dry prior to applying new dressing. Anesthetic (add to Medication List) Wound #1 Left Toe  Great o Topical Lidocaine 4% cream applied to wound bed prior to debridement (In Clinic Only). Primary Wound Dressing Wound #1 Left Toe Great o Silver Alginate Secondary Dressing Wound #1 Left Toe Great o Conform/Kerlix o Foam Dressing Change Frequency Wound #1 Left Toe Great o Change dressing every other day. Follow-up Appointments o Return Appointment in 1 week. Off-Loading Wound #1 Left Toe Great o Open toe surgical shoe with peg assist. Electronic Signature(s) Signed: 11/28/2019 4:29:10 PM By: Montey Hora Signed: 11/28/2019 5:47:36 PM By: Worthy Keeler PA-C Entered By: Montey Hora on 11/28/2019 10:35:51 Dickey, [name] ([id number]) PROCOPIO, ROVNER ([id number]) -------------------------------------------------------------------------------- Problem List Details Patient Name: [name], [name]. Date of Service: 11/28/2019 9:45 AM Medical Record Number: [id number] Patient Account Number: [id number] Date of Birth/Sex: [birth year] (73 y.o. M) Treating RN: Montey Hora Primary Care Provider: Frazier Richards Other Clinician: Referring Provider: Referral, Self Treating Provider/Extender: Melburn Hake, Kamille Toomey Weeks in Treatment: 0 Active Problems ICD-10 Evaluated Encounter Code Description Active Date Today Diagnosis E11.621  Type 2 diabetes mellitus with foot ulcer 11/28/2019 No Yes L97.522 Non-pressure chronic ulcer of other part of left foot with fat 11/28/2019 No Yes layer exposed I10 Essential (primary) hypertension 11/28/2019 No Yes E11.43 Type 2 diabetes mellitus with diabetic autonomic (poly) 11/28/2019 No Yes neuropathy Inactive Problems Resolved Problems Electronic Signature(s) Signed: 11/28/2019 5:47:36 PM By: Worthy Keeler PA-C Entered By: Worthy Keeler on 11/28/2019 10:25:17 Damron, [name] ([id number]) -------------------------------------------------------------------------------- Progress Note Details Patient Name: [name] Date of Service: 11/28/2019 9:45 AM Medical Record Number: [id number] Patient Account Number: [id number] Date of Birth/Sex: Mar 12, 1946 (73 y.o. M) Treating RN: Montey Hora Primary Care Provider: Frazier Richards Other Clinician: Referring Provider: Referral, Self Treating Provider/Extender: Melburn Hake, Huong Luthi Weeks in Treatment: 0 Subjective Chief Complaint Information obtained from Patient Left great toe ulcer History of Present Illness (HPI) 12/12/18 on evaluation today patient presents as a referral from his endocrinologist regarding issues that he has been having with his left great toe. Subsequently he has been seeing Dr. Cleda Mccreedy and Dr. Cleda Mccreedy has been the breeding the wound and in fact this was debrided this morning. The patient had an appointment there prior to coming here. Subsequently he states that even though he's been going for regular debridement after office things really do not seem to be showing signs of improvement unfortunately. He states that he is having some discomfort but nothing too significant. No fevers, chills, nausea, or vomiting noted at this time. The patient does have a history of hypertension, neuropathy, diabetes mellitus type II, and a history of stroke. Currently he's been using bacitracin on the toe and utilizing a  Band-Aid. He tells me he's had this wound for two years. He cannot remember the last time he had an x-ray. 12/19/18 on evaluation today patient appears to be doing well in regard to his plantar toe ulcer. He's been tolerating the dressing changes without complication. Fortunately there does not appear to be signs of infection at this time which is good news. No fever chills noted. 01/19/19 has been one month since I last saw the patient as he was out of town. With that being said on evaluation today it does appear that he is going to require sharp debridement and I do believe that the total contact cast would benefit him as far as being applied at this time. He is in agreement that plan. 01/23/19 on evaluation today patient appears to be doing better in regard to his left great toe ulcer.  He has been shown signs of improvement week by week and although this is slow it does seem to be doing fairly well which is good news. Overall very pleased in this regard. 01/31/19 on evaluation today patient presents for follow-up concerning his great toe ulcer. He was actually supposed to be here yesterday and he did come however he ended up having to leave due to his blood sugar dropping he states he just did not eat enough lunch. Fortunately seems to be doing much better today which is excellent news. Fortunately there's no evidence of infection at this time which is also good news. No fevers, chills, nausea, or vomiting noted at this time. Overall I feel like he is making excellent progress. 02/06/19 on evaluation today patient actually appears to be doing very well in regard to his plantar toe ulcer. He's been tolerating the dressing changes without complication. Fortunately there is no sign of infection at this time. Overall been very pleased with how things seem to be progressing. No fevers, chills, nausea, or vomiting noted at this time. 02/13/19 on evaluation today patient actually appears to be doing  excellent in regard to his toe ulcer which is almost completely close. Overall very pleased with that. There does not appear to be any signs of infection which is good news. Upon close inspection it appears that he has just a very slight opening still noted at the central portion of the toe although this is minimal. 02/16/19 on evaluation today patient is seen for early follow-up due to the fact that he tells me while at home he actually got up to go answer the door without putting on the walking boot part of his cast and subsequently damaged the total contact cast. It therefore comes in to have this removed and potentially replaced. Fortunately other than it given him a little bit of pain its far as pushing in on some areas there doesn't appear to be any injury once the cast was removed. PHENIX, SUNSHINE ([id number]) 03/13/19 on evaluation today patient unfortunately presents for follow-up concerning his great toe ulcer on the left. He was discharged on 02/16/19 with the wound healed at that point. He tells me this remain so for several weeks or least a couple weeks before reopening. Fortunately there does not appear to be any signs of infection at this time. Nonetheless for the past week at least he's had the wound reopened and states it is been trying to keep pressure off of this but he has not been using the offloading shoe we previously gave him. Fortunately there's no evidence of infection at this time. No fevers, chills, nausea, or vomiting noted at this time. 03/17/19 on evaluation today patient actually appears to be doing rather well in regard to his toe also. Fact this appears to be significantly smaller this week even compared to what I saw last week on evaluation. He seems to have done very well for the offloading shoe and overall I'm very pleased with the progress that is made. It may be that he doesn't even need the cast to get this to heal if he offloads this  appropriately. 03/24/19 on evaluation today patient actually appears to be doing well in regard to his plantar foot ulcer. He's been tolerating the dressing changes without complication the collagen does seem to be beneficial for him. With that being said he is gonna require some miles sharp debridement today to clear away some of the callous's around the edge of the wound  as well as the minimal Slough noted on the surface of the wound. 03/31/19 on evaluation today patient appears to be doing well in regard to his plantar great toe ulcer. He has been tolerating the dressing changes without complication which is good news. Fortunately there does not appear to be any signs of active infection at this time. Overall very pleased with how things seem to be progressing. 04/11/19 on evaluation today patient's tools are appears to be doing in my pinion roughly about the same as were things that previous. Fortunately there does not appear to be signs of active infection at this time which is good news. With that being said he has not been experiencing any pain at this time which is good news there is also No fevers, chills, nausea, or vomiting noted at this time. 04/18/19 on evaluation today patient's wound actually appears to be doing fairly well today he did not even really have a lot of callous buildup. We have been debating on whether or not to reinitiate total contact cast. With that being said he seems to be doing fairly well this point with offloading in my pinion and again I think we need to come up with a way to get this healed that will also be sustainable for keeping it close. Again we were able to close it with the cast previous but again he has to be able to keep it such. For that reason we are working on trying to get him diabetic shoes he's waiting for the [place] to get in touch with the local company that has to measure him for these do not want to have a cast on at such a time as when they need  to actually measure him. 04/25/19 on evaluation today patient appears to be doing well in regard to his plantar foot specifically great toe ulcer. He's been tolerating the dressing changes without complication. Fortunately there's no signs of active infection at this time. Overall I'm very pleased with how things appear currently. 05/02/19 on evaluation today patient appears to actually be doing better in regard to his plantar foot ulcer. He shown signs of good improvement which is excellent news. She states he can walk better with the cast on the can with the offloading shoe that he had on previous. Fortunately there's no signs of active infection at this time. No fevers, chills, nausea, or vomiting noted at this time. 05/09/19 on evaluation today patient actually appears to be doing excellent. In fact he appears to be completely healed based on evaluation at this time. Fortunately there's no signs of infection and is having no discomfort. 06/08/19 this is a follow-up visit although the patient has been healed for almost a month but not quite. He tells me that in the past several days he noted some blood coming from his toe and he thought he should come in at this checked out. Fortunately he's having a pain. He also did get his custom orthotics shoes. With that being said he still appears to have developed this ulceration there is something he tells me that the orthotic specialist stated they could do to the toes to try to help out in this regard although he did not know exactly what it was we may need to check with [facility]. 06/12/19 on evaluation today patient actually appears to be doing great in regard to his foot ulcer. The wound on his toes shown signs of excellent improvement which is great news. With that being said he  is not completely close yet the cast has done wonders for him and he is definitely headed in the correct direction. No fevers, chills, nausea, or vomiting noted at  this time. 06/22/19 on evaluation today fortunately patient actually appears to be healing quite well and in fact appears to be completely healed this is excellent news. Fortunately there's no signs of active infection he's been tolerating the total contact cast without complication. Overall I'm pleased with the progress that has been made. He also has his new custom shoes he brought was ARYA, HICKEN ([id number]) with him today. 07/04/19 on evaluation today patient actually appears to be doing a little bit worse in regard his toe ulcer unfortunately this has yet again reopened. It has only been about a week and a half since I've last seen him I really did not expect this to reopen this quickly. Nonetheless I did discuss with him his normal routine in order to see what may be causing this. He has custom shoes. With that being said he apparently has not been using the custom shoes and even when he has used them he has not even put the insert in there which is what was custom molded to him in order to appropriately offload high-pressure point areas. In fact his exact question to me was whether or not he needed to actually put those in they just came loose in the box and he had never installed them. Nonetheless it also came to light the he's been walking around in his bedroom slippers at home he tells me he does not go barefoot but he really has not been wearing his custom offloading/diabetic shoes. Nonetheless I do believe that this is why he continually reopens as far as the wound is concerned. I discussed this with the patient in great detail today for yet again although I previously had this discussion with him in the past. 07/11/19-Patient returns at 1 week for his left great toe wound which is actually doing really well, he has been using the insert to offload the toe and is very pleased with it. This looks like this is working out really well for him 07/18/19 on evaluation today patient  appears to be doing really about the same with regard to his ulcer on the right great toe. He's been tolerating the dressing changes without complication. Fortunately there's no signs of active infection at this time. With that being said I think that we're having a difficult time getting this to heal and I do believe that initiating treatment with the total contact cast to get this to close would be a good idea. The good news is he seems to be keeping this from worsening so hopefully that means that if we get it closed he can prevent this from reopening in the future. At this point my suggestion was that we go ahead and initiate treatment at both sites utilizing collagen. We will then subsequently put a piece of silver out and it between the toes of the right foot in the webbing between the fourth and fifth toe in order to help keep things dry and allow this to hopefully close and we heal without complication. The patient is in agreement with plan. Subsequently I am hopeful that both wounds will heal quite quickly we have been very close now with the ankle and again the toe has reopened but fortunately doesn't appear to be to bad at this point. No antibiotics were prescribed today as they did not appear to  be necessary. 07/25/19 on evaluation today patient actually appears to be doing excellent in regard to his great toe ulcer. In fact this really appears to be almost completely healed if indeed it is not in fact healed. With that being said there is still the eighth small pinpoint area that could be open although I tend to doubt it. Nonetheless it is something that I would like to continue to watch for more week I do think it would be beneficial for [place] to go ahead and continue with the cast for one more week before deciding to discharge him especially in light of the issues he's had in the past. Specifically with regard to the wound reopening rather quickly. 08/01/2019 upon evaluation today patient  actually appears to be doing well with regard to his great toe ulcer. He has been tolerating the dressing changes without complication. Fortunately we think appears to be completely healed at this time which is great news. I am very pleased with the progress up to this point. In fact he appears to be completely healed at this point. Readmission: 09/15/2019 patient presents today for reevaluation here in our clinic concerning issues that he is having with his left great toe as he did previous. We have not actually seen him since August 01, 2019. He had been doing very well until he noticed when walking into the bathroom with just the socks on the other night that he had a little bit of bleeding from the toe location. Subsequently he decided to make an appointment to come in as he did not want this to get significantly worse. There does not appear to be any signs of active infection he tells me as long as he was wearing his shoes he seemed to be doing okay is walking around not necessarily barefoot but just with socks on that I think may be his main problem at this time. 09/22/2019 on evaluation today patient actually appears to be doing quite well with regard to his great toe ulcer. This is making signs of good improvement even in the short amount of time we have been taking care of his toe since he came back. Is just been 1 week. Nonetheless the improvement in the wound size is excellent he also really has no depth and no evidence of infection which is great news. 09/29/2019 on evaluation today patient appears to be doing well with regard to his toe ulcer. This is showing signs of improvement in fact it was questioning whether or not today this actually was healed. However there did appear to be some callus covering the surface of the wound was removed there did appear to be a small pinpoint opening still in the middle of the wound although this is minimal. 10/06/2019 on evaluation today patient appears  to be doing excellent in regard to his toe ulcer which actually showed signs of complete Epithelization today. There is no evidence of active infection which is good news. Overall he has been tolerating the cast without complication but I think he is done with that as of now. [name], [name] ([id number]) Readmission: 11/28/2019 on evaluation today patient appears to be unfortunately here today again for another issue with his left great toe. He has been having some significant problems intermittently over the past year or really that I been seeing him. Were able to heal him and then subsequently things will get worse yet again. With that being said the patient tells me at this time that he began to  have issues with drainage noted 2 weeks ago. He has no pain but again he is never really had any discomfort. No fevers, chills, nausea, vomiting, or diarrhea. He has been using his offloading shoes he tells me. Patient History Information obtained from Patient. Allergies No Known Allergies Family History Diabetes - Mother, Heart Disease - Father, Stroke - Father, No family history of Cancer, Hereditary Spherocytosis, Hypertension, Kidney Disease, Lung Disease, Seizures, Thyroid Problems, Tuberculosis. Social History Never smoker, Marital Status - Widowed, Alcohol Use - Never, Drug Use - No History, Caffeine Use - Moderate. Medical History Eyes Denies history of Cataracts, Glaucoma, Optic Neuritis Ear/Nose/Mouth/Throat Denies history of Chronic sinus problems/congestion, Middle ear problems Hematologic/Lymphatic Denies history of Anemia, Hemophilia, Human Immunodeficiency Virus, Lymphedema Respiratory Denies history of Aspiration, Asthma, Chronic Obstructive Pulmonary Disease (COPD), Pneumothorax, Sleep Apnea, Tuberculosis Cardiovascular Patient has history of Hypertension Denies history of Angina, Arrhythmia, Congestive Heart Failure, Coronary Artery Disease, Deep Vein  Thrombosis, Hypotension, Myocardial Infarction, Peripheral Arterial Disease, Peripheral Venous Disease, Phlebitis, Vasculitis Gastrointestinal Denies history of Cirrhosis , Colitis, Crohn s, Hepatitis A, Hepatitis B, Hepatitis C Endocrine Patient has history of Type II Diabetes Denies history of Type I Diabetes Genitourinary Denies history of End Stage Renal Disease Immunological Denies history of Lupus Erythematosus, Raynaud s, Scleroderma Integumentary (Skin) Denies history of History of Burn, History of pressure wounds Musculoskeletal Denies history of Gout, Rheumatoid Arthritis, Osteoarthritis, Osteomyelitis Neurologic Denies history of Dementia, Neuropathy, Quadriplegia, Paraplegia, Seizure Disorder Oncologic Denies history of Received Chemotherapy, Received Radiation [name], [name] ([id number]) Objective Constitutional sitting or standing blood pressure is within target range for patient.. pulse regular and within target range for patient.Marland Kitchen respirations regular, non-labored and within target range for patient.Marland Kitchen temperature within target range for patient.. Well- nourished and well-hydrated in no acute distress. Vitals Time Taken: 9:40 AM, Height: 72 in, Source: Stated, Weight: 254 lbs, Source: Stated, BMI: 34.4, Temperature: 98.3 F, Pulse: 79 bpm, Respiratory Rate: 18 breaths/min, Blood Pressure: 140/82 mmHg. Eyes conjunctiva clear no eyelid edema noted. pupils equal round and reactive to light and accommodation. Ears, Nose, Mouth, and Throat no gross abnormality of ear auricles or external auditory canals. normal hearing noted during conversation. mucus membranes moist. Respiratory normal breathing without difficulty. clear to auscultation bilaterally. Cardiovascular regular rate and rhythm with normal S1, S2. 2+ dorsalis pedis/posterior tibialis pulses. no clubbing, cyanosis, significant edema, Gastrointestinal (GI) soft, non-tender, non-distended, +BS. no  ventral hernia noted. Musculoskeletal normal gait and posture. no significant deformity or arthritic changes, no loss or range of motion, no clubbing. Psychiatric this patient is able to make decisions and demonstrates good insight into disease process. Alert and Oriented x 3. pleasant and cooperative. General Notes: Upon inspection patient's wound bed actually showed signs of good granulation at this time. There was however significant callus buildup around the edge of the wound and there was some slough noted as well. Actually did perform sharp debridement to clear away some of the necrotic tissue from the surface of the wound down to good subcutaneous tissue. Also cleared away the callus from around the edges of the wound. Patient tolerated all of this today without complication and post debridement the wound bed appears to be doing much better which is great news. Integumentary (Hair, Skin) Wound #1 status is Open. Original cause of wound was Gradually Appeared. The wound is located on the Left Toe Great. The wound measures 0.2cm length x 0.4cm width x 0.6cm depth; 0.063cm^2 area and 0.038cm^3 volume. There is Fat Layer (Subcutaneous Tissue)  Exposed exposed. There is no tunneling noted, however, there is undermining starting at 9:00 and ending at 3:00 with a maximum distance of 0.5cm. There is a medium amount of serosanguineous drainage noted. The wound margin is flat and intact. There is no granulation within the wound bed. There is a large (67-100%) amount of necrotic tissue within the wound bed including Eschar and Adherent Slough. [name], [name] ([id number]) Assessment Active Problems ICD-10 Type 2 diabetes mellitus with foot ulcer Non-pressure chronic ulcer of other part of left foot with fat layer exposed Essential (primary) hypertension Type 2 diabetes mellitus with diabetic autonomic (poly)neuropathy Procedures Wound #1 Pre-procedure diagnosis of Wound #1 is a Diabetic  Wound/Ulcer of the Lower Extremity located on the Left Toe Great .Severity of Tissue Pre Debridement is: Fat layer exposed. There was a Excisional Skin/Subcutaneous Tissue Debridement with a total area of 0.08 sq cm performed by STONE III, Phillipe Clemon E., PA-C. With the following instrument(s): Curette to remove Viable and Non-Viable tissue/material. Material removed includes Callus, Subcutaneous Tissue, and Slough after achieving pain control using Lidocaine 4% Topical Solution. No specimens were taken. A time out was conducted at 10:29, prior to the start of the procedure. A Minimum amount of bleeding was controlled with Pressure. The procedure was tolerated well with a pain level of 0 throughout and a pain level of 0 following the procedure. Post Debridement Measurements: 0.5cm length x 0.5cm width x 0.3cm depth; 0.059cm^3 volume. Character of Wound/Ulcer Post Debridement is improved. Severity of Tissue Post Debridement is: Fat layer exposed. Post procedure Diagnosis Wound #1: Same as Pre-Procedure Plan Wound Cleansing: Wound #1 Left Toe Great: May Shower, gently pat wound dry prior to applying new dressing. Anesthetic (add to Medication List): Wound #1 Left Toe Great: Topical Lidocaine 4% cream applied to wound bed prior to debridement (In Clinic Only). Primary Wound Dressing: Wound #1 Left Toe Great: Silver Alginate Secondary Dressing: Wound #1 Left Toe Great: Conform/Kerlix Foam Dressing Change Frequency: Wound #1 Left Toe Great: Change dressing every other day. Follow-up Appointments: Return Appointment in 1 week. Off-Loading: Wound #1 Left Toe Great: Open toe surgical shoe with peg assist. [name], [name]. ([id number]) 1. My suggestion currently was that we could potentially go ahead and continue with the reinitiation of a total contact cast which in the past has done very well for the patient. With that being said he wants to hold off at least a week on not  before reapplying. 2. With regard to the dressing, and initiate treatment with a silver alginate dressing since I did have to perform quite a bit of debridement he may have increased drainage possibly even some bleeding. 3. With regard to the offloading we are to use a peg assist offloading shoe he has 1 of those at home he states he will begin using that today when he gets home in the interim until we reinitiate the total contact cast. That said things are not doing well enough come next week. We will see patient back for reevaluation in 1 week here in the clinic. If anything worsens or changes patient will contact our office for additional recommendations. Electronic Signature(s) Signed: 11/28/2019 5:47:36 PM By: Worthy Keeler PA-C Entered By: Worthy Keeler on 11/28/2019 10:49:41 Nicoll, [name] ([id number]) -------------------------------------------------------------------------------- ROS/PFSH Details Patient Name: [name] Date of Service: 11/28/2019 9:45 AM Medical Record Number: [id number] Patient Account Number: [id number] Date of Birth/Sex: 07-Oct-1946 (73 y.o. M) Treating RN: Harold Barban Primary Care Provider: Frazier Richards Other Clinician:  Referring Provider: Referral, Self Treating Provider/Extender: STONE III, Jmari Pelc Weeks in Treatment: 0 Information Obtained From Patient Eyes Medical History: Negative for: Cataracts; Glaucoma; Optic Neuritis Ear/Nose/Mouth/Throat Medical History: Negative for: Chronic sinus problems/congestion; Middle ear problems Hematologic/Lymphatic Medical History: Negative for: Anemia; Hemophilia; Human Immunodeficiency Virus; Lymphedema Respiratory Medical History: Negative for: Aspiration; Asthma; Chronic Obstructive Pulmonary Disease (COPD); Pneumothorax; Sleep Apnea; Tuberculosis Cardiovascular Medical History: Positive for: Hypertension Negative for: Angina; Arrhythmia; Congestive Heart Failure; Coronary  Artery Disease; Deep Vein Thrombosis; Hypotension; Myocardial Infarction; Peripheral Arterial Disease; Peripheral Venous Disease; Phlebitis; Vasculitis Gastrointestinal Medical History: Negative for: Cirrhosis ; Colitis; Crohnos; Hepatitis A; Hepatitis B; Hepatitis C Endocrine Medical History: Positive for: Type II Diabetes Negative for: Type I Diabetes Time with diabetes: 1990 Treated with: Insulin Blood sugar tested every day: Yes Tested : Genitourinary Medical History: Negative for: End Stage Renal Disease [name], [name] ([id number]) Immunological Medical History: Negative for: Lupus Erythematosus; Raynaudos; Scleroderma Integumentary (Skin) Medical History: Negative for: History of Burn; History of pressure wounds Musculoskeletal Medical History: Negative for: Gout; Rheumatoid Arthritis; Osteoarthritis; Osteomyelitis Neurologic Medical History: Negative for: Dementia; Neuropathy; Quadriplegia; Paraplegia; Seizure Disorder Oncologic Medical History: Negative for: Received Chemotherapy; Received Radiation Immunizations Pneumococcal Vaccine: Received Pneumococcal Vaccination: Yes Implantable Devices None Family and Social History Cancer: No; Diabetes: Yes - Mother; Heart Disease: Yes - Father; Hereditary Spherocytosis: No; Hypertension: No; Kidney Disease: No; Lung Disease: No; Seizures: No; Stroke: Yes - Father; Thyroid Problems: No; Tuberculosis: No; Never smoker; Marital Status - Widowed; Alcohol Use: Never; Drug Use: No History; Caffeine Use: Moderate; Financial Concerns: No; Food, Clothing or Shelter Needs: No; Support System Lacking: No; Transportation Concerns: No Electronic Signature(s) Signed: 11/28/2019 5:47:36 PM By: Worthy Keeler PA-C Signed: 11/29/2019 4:10:47 PM By: Harold Barban Entered By: Harold Barban on 11/28/2019 09:55:14 Dean, [name]  ([id number]) -------------------------------------------------------------------------------- SuperBill Details Patient Name: [name]. Date of Service: 11/28/2019 Medical Record Number: [id number] Patient Account Number: [id number] Date of Birth/Sex: 01/15/1946 (73 y.o. M) Treating RN: Montey Hora Primary Care Provider: Frazier Richards Other Clinician: Referring Provider: Referral, Self Treating Provider/Extender: Melburn Hake, Kanan Sobek Weeks in Treatment: 0 Diagnosis Coding ICD-10 Codes Code Description E11.621 Type 2 diabetes mellitus with foot ulcer L97.522 Non-pressure chronic ulcer of other part of left foot with fat layer exposed I10 Essential (primary) hypertension E11.43 Type 2 diabetes mellitus with diabetic autonomic (poly)neuropathy Facility Procedures CPT4 Code: [id number] Description: [place] VISIT-LEV 3 EST PT Modifier: Quantity: 1 CPT4 Code: [id number] Description: [place] - DEB SUBQ TISSUE 20 SQ CM/< ICD-10 Diagnosis Description L97.522 Non-pressure chronic ulcer of other part of left foot with fat Modifier: layer exposed Quantity: 1 Physician Procedures CPT4 Code: [id number] Description: [id number] - WC PHYS LEVEL 4 - EST PT ICD-10 Diagnosis Description E11.621 Type 2 diabetes mellitus with foot ulcer L97.522 Non-pressure chronic ulcer of other part of left foot with fat I10 Essential (primary) hypertension E11.43 Type 2 diabetes  mellitus with diabetic autonomic (poly)neuropa Modifier: 25 layer exposed thy Quantity: 1 CPT4 Code: [id number] Description: [id number] - WC PHYS SUBQ TISS 20 SQ CM ICD-10 Diagnosis Description L97.522 Non-pressure chronic ulcer of other part of left foot with fat Modifier: layer exposed Quantity: 1 Electronic Signature(s) Signed: 11/28/2019 5:47:36 PM By: Worthy Keeler PA-C Entered By: Worthy Keeler on 11/28/2019 10:49:58

## 2019-11-29 NOTE — Progress Notes (Signed)
[name], [name] ([id number]) Visit Report for 11/28/2019 Abuse/Suicide Risk Screen Details Patient Name: [name], [name]. Date of Service: 11/28/2019 9:45 AM Medical Record Number: [id number] Patient Account Number: [id number] Date of Birth/Sex: Apr 18, 1946 (73 y.o. M) Treating RN: Harold Barban Primary Care Adaira Centola: Frazier Richards Other Clinician: Referring Christi Wirick: Referral, Self Treating Dody Smartt/Extender: Melburn Hake, HOYT Weeks in Treatment: 0 Abuse/Suicide Risk Screen Items Answer ABUSE RISK SCREEN: Has anyone close to you tried to hurt or harm you recentlyo No Do you feel uncomfortable with anyone in your familyo No Has anyone forced you do things that you didnot want to doo No Electronic Signature(s) Signed: 11/29/2019 4:10:47 PM By: Harold Barban Entered By: Harold Barban on 11/28/2019 09:55:21 [place], [name] ([id number]) -------------------------------------------------------------------------------- Activities of Daily Living Details Patient Name: [name] Date of Service: 11/28/2019 9:45 AM Medical Record Number: [id number] Patient Account Number: [id number] Date of Birth/Sex: [birth year] (73 y.o. M) Treating RN: Harold Barban Primary Care Deoni Cosey: Frazier Richards Other Clinician: Referring Iantha Titsworth: Referral, Self Treating Malloree Raboin/Extender: Melburn Hake, HOYT Weeks in Treatment: 0 Activities of Daily Living Items Answer Activities of Daily Living (Please select one for each item) Drive Automobile Completely Able Take Medications Completely Able Use Telephone Completely Able Care for Appearance Completely Able Use Toilet Completely Able Bath / Shower Completely Able Dress Self Completely Able Feed Self Completely Able Walk Need Assistance Get In / Out Bed Completely Able Housework Completely Able Prepare Meals Completely [place] for Self Completely Able Electronic  Signature(s) Signed: 11/29/2019 4:10:47 PM By: Harold Barban Entered By: Harold Barban on 11/28/2019 09:55:38 Czajka, [name] ([id number]) -------------------------------------------------------------------------------- Education Screening Details Patient Name: [name] Date of Service: 11/28/2019 9:45 AM Medical Record Number: [id number] Patient Account Number: [id number] Date of Birth/Sex: [birth year] (72 y.o. M) Treating RN: Harold Barban Primary Care Caymen Dubray: Frazier Richards Other Clinician: Referring Sameul Tagle: Referral, Self Treating Hriday Stai/Extender: Sharalyn Ink in Treatment: 0 Primary Learner Assessed: Patient Learning Preferences/Education Level/Primary Language Learning Preference: Explanation Highest Education Level: Grade School Preferred Language: English Cognitive Barrier Language Barrier: No Translator Needed: No Memory Deficit: No Emotional Barrier: No Cultural/Religious Beliefs Affecting Medical Care: No Physical Barrier Impaired Vision: No Impaired Hearing: No Decreased Hand dexterity: No Knowledge/Comprehension Knowledge Level: High Comprehension Level: High Ability to understand written High instructions: Ability to understand verbal High instructions: Motivation Anxiety Level: Calm Cooperation: Cooperative Education Importance: Acknowledges Need Interest in Health Problems: Asks Questions Perception: Coherent Willingness to Engage in Self- High Management Activities: Readiness to Engage in Self- High Management Activities: Electronic Signature(s) Signed: 11/29/2019 4:10:47 PM By: Harold Barban Entered By: Harold Barban on 11/28/2019 09:56:05 Ra, [name] ([id number]) -------------------------------------------------------------------------------- Fall Risk Assessment Details Patient Name: [name] Date of Service: 11/28/2019 9:45 AM Medical Record Number: [id number] Patient  Account Number: [id number] Date of Birth/Sex: 11-13-1946 (73 y.o. M) Treating RN: Harold Barban Primary Care Indianna Boran: Frazier Richards Other Clinician: Referring Kynsley Whitehouse: Referral, Self Treating Krystall Kruckenberg/Extender: Melburn Hake, HOYT Weeks in Treatment: 0 Fall Risk Assessment Items Have you had 2 or more falls in the last 12 monthso 0 No Have you had any fall that resulted in injury in the last 12 monthso 0 No FALLS RISK SCREEN History of falling - immediate or within 3 months 0 No Secondary diagnosis (Do you have 2 or more medical diagnoseso) 0 No Ambulatory aid None/bed rest/wheelchair/nurse 0 No Crutches/cane/walker 15 Yes Furniture 0 No Intravenous therapy Access/Saline/Heparin Lock 0 No Gait/Transferring Normal/ bed rest/ wheelchair 0 No Weak (short  steps with or without shuffle, stooped but able to lift head while 0 No walking, may seek support from furniture) Impaired (short steps with shuffle, may have difficulty arising from chair, head 0 No down, impaired balance) Mental Status Oriented to own ability 0 No Electronic Signature(s) Signed: 11/29/2019 4:10:47 PM By: Harold Barban Entered By: Harold Barban on 11/28/2019 09:56:19 Luke, [name] ([id number]) -------------------------------------------------------------------------------- Foot Assessment Details Patient Name: [name]. Date of Service: 11/28/2019 9:45 AM Medical Record Number: [id number] Patient Account Number: [id number] Date of Birth/Sex: 19-Jan-1946 (73 y.o. M) Treating RN: Harold Barban Primary Care Lokelani Lutes: Frazier Richards Other Clinician: Referring Holland Nickson: Referral, Self Treating Syerra Abdelrahman/Extender: Melburn Hake, HOYT Weeks in Treatment: 0 Foot Assessment Items Site Locations + = Sensation present, - = Sensation absent, C = Callus, U = Ulcer R = Redness, W = Warmth, M = Maceration, PU = Pre-ulcerative lesion F = Fissure, S = Swelling, D = Dryness Assessment Right:  Left: Other Deformity: No No Prior Foot Ulcer: No No Prior Amputation: No No Charcot Joint: No No Ambulatory Status: Ambulatory With Help Assistance Device: Cane Gait: Steady Electronic Signature(s) Signed: 11/29/2019 4:10:47 PM By: Harold Barban Entered By: Harold Barban on 11/28/2019 09:57:32 [place], [name] ([id number]) -------------------------------------------------------------------------------- Nutrition Risk Screening Details Patient Name: [name] Date of Service: 11/28/2019 9:45 AM Medical Record Number: [id number] Patient Account Number: [id number] Date of Birth/Sex: 1946/06/19 (73 y.o. M) Treating RN: Harold Barban Primary Care Eino Whitner: Frazier Richards Other Clinician: Referring Algenis Ballin: Referral, Self Treating Debrah Granderson/Extender: Melburn Hake, HOYT Weeks in Treatment: 0 Height (in): 72 Weight (lbs): 254 Body Mass Index (BMI): 34.4 Nutrition Risk Screening Items Score Screening NUTRITION RISK SCREEN: I have an illness or condition that made me change the kind and/or amount of 0 No food I eat I eat fewer than two meals per day 0 No I eat few fruits and vegetables, or milk products 0 No I have three or more drinks of beer, liquor or wine almost every day 0 No I have tooth or mouth problems that make it hard for me to eat 0 No I don't always have enough money to buy the food I need 0 No I eat alone most of the time 0 No I take three or more different prescribed or over-the-counter drugs a day 1 Yes Without wanting to, I have lost or gained 10 pounds in the last six months 0 No I am not always physically able to shop, cook and/or feed myself 0 No Nutrition Protocols Good Risk Protocol Moderate Risk Protocol High Risk Proctocol Risk Level: Good Risk Score: 1 Electronic Signature(s) Signed: 11/29/2019 4:10:47 PM By: Harold Barban Entered By: Harold Barban on 11/28/2019 09:56:33

## 2019-12-05 ENCOUNTER — Other Ambulatory Visit: Payer: Self-pay

## 2019-12-05 ENCOUNTER — Encounter: Payer: Medicare Other | Admitting: Physician Assistant

## 2019-12-05 DIAGNOSIS — E11621 Type 2 diabetes mellitus with foot ulcer: Secondary | ICD-10-CM | POA: Diagnosis not present

## 2019-12-05 NOTE — Progress Notes (Signed)
[name], [name] ([id number]) Visit Report for 12/05/2019 Allergy List Details Patient Name: [name], [name]. Date of Service: 12/05/2019 1:30 PM Medical Record Number: [id number] Patient Account Number: [id number] Date of Birth/Sex: 1946-08-15 (73 y.o. M) Treating RN: Army Melia Primary Care Shaka Zech: Frazier Richards Other Clinician: Referring Donice Alperin: Frazier Richards Treating Kenzel Ruesch/Extender: STONE III, HOYT Weeks in Treatment: 1 Allergies Active Allergies No Known Allergies Allergy Notes Electronic Signature(s) Signed: 12/05/2019 3:45:11 PM By: Worthy Keeler PA-C Entered By: Worthy Keeler on 12/05/2019 13:33:48 [name], [name] ([id number]) -------------------------------------------------------------------------------- Patient/Caregiver Education Details Patient Name: [name] Date of Service: 12/05/2019 1:30 PM Medical Record Number: [id number] Patient Account Number: [id number] Date of Birth/Gender: 10-Jul-1946 (73 y.o. M) Treating RN: Montey Hora Primary Care Physician: Frazier Richards Other Clinician: Referring Physician: Frazier Richards Treating Physician/Extender: Melburn Hake, HOYT Weeks in Treatment: 1 Education Assessment Education Provided To: Patient Education Topics Provided Wound/Skin Impairment: Handouts: Other: wound care as ordered Methods: Demonstration, Explain/Verbal Responses: State content correctly Electronic Signature(s) Signed: 12/05/2019 4:13:22 PM By: Montey Hora Entered By: Montey Hora on 12/05/2019 13:38:36 [name], [name] ([id number]) -------------------------------------------------------------------------------- Wound Assessment Details Patient Name: [name]. Date of Service: 12/05/2019 1:30 PM Medical Record Number: [id number] Patient Account Number: [id number] Date of Birth/Sex: [birth year] (73 y.o. M) Treating RN: Army Melia Primary Care Thai Hemrick: Frazier Richards Other Clinician: Referring Hasel Janish: Frazier Richards Treating Darinda [place]/Extender: Melburn Hake, HOYT Weeks in Treatment: 1 Wound Status Wound Number: 1 Primary Etiology: Diabetic Wound/Ulcer of the Lower Extremity Wound Location: Left Toe Great Wound Status: Open Wounding Event: Gradually Appeared Comorbid Hypertension, Type II Diabetes Date Acquired: 11/14/2019 History: Weeks Of Treatment: 1 Clustered Wound: No Photos Wound Measurements Length: (cm) 0.4 % Reduction i Width: (cm) 0.4 % Reduction i Depth: (cm) 0.3 Epithelializa Area: (cm) 0.126 Volume: (cm) 0.038 n Area: -100% n Volume: 0% tion: None Wound Description Classification: Grade 1 Foul Odor Aft Wound Margin: Flat and Intact Slough/Fibrin Exudate Amount: Medium Exudate Type: Serosanguineous Exudate Color: red, brown er Cleansing: No o Yes Wound Bed Granulation Amount: None Present (0%) Exposed Structure Necrotic Amount: Large (67-100%) Fascia Exposed: No Necrotic Quality: Eschar, Adherent Slough Fat Layer (Subcutaneous Tissue) Exposed: Yes Tendon Exposed: No Muscle Exposed: No Joint Exposed: No Bone Exposed: No Electronic Signature(s) ZANDEN, BURGERS ([id number]) Signed: 12/05/2019 4:00:27 PM By: Army Melia Entered By: Army Melia on 12/05/2019 13:31:14 [name] ([id number]) -------------------------------------------------------------------------------- Vitals Details Patient Name: [name]. Date of Service: 12/05/2019 1:30 PM Medical Record Number: [id number] Patient Account Number: [id number] Date of Birth/Sex: [birth year] (73 y.o. M) Treating RN: Army Melia Primary Care Pairlee Sawtell: Frazier Richards Other Clinician: Referring Verdell Dykman: Frazier Richards Treating Sharyah Bostwick/Extender: Melburn Hake, HOYT Weeks in Treatment: 1 Vital Signs Time Taken: 13:27 Temperature (F): 98.4 Height (in): 72 Pulse (bpm): 88 Weight (lbs): 254 Respiratory Rate  (breaths/min): 16 Body Mass Index (BMI): 34.4 Blood Pressure (mmHg): 175/84 Reference Range: 80 - 120 mg / dl Electronic Signature(s) Signed: 12/05/2019 4:00:27 PM By: Army Melia Entered By: Army Melia on 12/05/2019 13:28:47

## 2019-12-05 NOTE — Progress Notes (Signed)
MAKII, ARCIDIACONO ([id number]) Visit Report for 12/05/2019 Chief Complaint Document Details Patient Name: [name], [name]. Date of Service: 12/05/2019 1:30 PM Medical Record Number: [id number] Patient Account Number: [id number] Date of Birth/Sex: 1946/05/28 (73 y.o. M) Treating RN: Montey Hora Primary Care Provider: Frazier Richards Other Clinician: Referring Provider: Frazier Richards Treating Provider/Extender: Melburn Hake, HOYT Weeks in Treatment: 1 Information Obtained from: Patient Chief Complaint Left great toe ulcer Electronic Signature(s) Signed: 12/05/2019 3:45:11 PM By: Worthy Keeler PA-C Entered By: Worthy Keeler on 12/05/2019 13:33:59 [place], [name] ([id number]) -------------------------------------------------------------------------------- HPI Details Patient Name: [name] Date of Service: 12/05/2019 1:30 PM Medical Record Number: [id number] Patient Account Number: [id number] Date of Birth/Sex: 10-04-1946 (73 y.o. M) Treating RN: Montey Hora Primary Care Provider: Frazier Richards Other Clinician: Referring Provider: Frazier Richards Treating Provider/Extender: Melburn Hake, HOYT Weeks in Treatment: 1 History of Present Illness HPI Description: 12/12/18 on evaluation today patient presents as a referral from his endocrinologist regarding issues that he has been having with his left great toe. Subsequently he has been seeing Dr. Cleda Mccreedy and Dr. Cleda Mccreedy has been the breeding the wound and in fact this was debrided this morning. The patient had an appointment there prior to coming here. Subsequently he states that even though he's been going for regular debridement after office things really do not seem to be showing signs of improvement unfortunately. He states that he is having some discomfort but nothing too significant. No fevers, chills, nausea, or vomiting noted at this time. The patient does have a history of hypertension,  neuropathy, diabetes mellitus type II, and a history of stroke. Currently he's been using bacitracin on the toe and utilizing a Band-Aid. He tells me he's had this wound for two years. He cannot remember the last time he had an x-ray. 12/19/18 on evaluation today patient appears to be doing well in regard to his plantar toe ulcer. He's been tolerating the dressing changes without complication. Fortunately there does not appear to be signs of infection at this time which is good news. No fever chills noted. 01/19/19 has been one month since I last saw the patient as he was out of town. With that being said on evaluation today it does appear that he is going to require sharp debridement and I do believe that the total contact cast would benefit him as far as being applied at this time. He is in agreement that plan. 01/23/19 on evaluation today patient appears to be doing better in regard to his left great toe ulcer. He has been shown signs of improvement week by week and although this is slow it does seem to be doing fairly well which is good news. Overall very pleased in this regard. 01/31/19 on evaluation today patient presents for follow-up concerning his great toe ulcer. He was actually supposed to be here yesterday and he did come however he ended up having to leave due to his blood sugar dropping he states he just did not eat enough lunch. Fortunately seems to be doing much better today which is excellent news. Fortunately there's no evidence of infection at this time which is also good news. No fevers, chills, nausea, or vomiting noted at this time. Overall I feel like he is making excellent progress. 02/06/19 on evaluation today patient actually appears to be doing very well in regard to his plantar toe ulcer. He's been tolerating the dressing changes without complication. Fortunately there is no sign of infection at this time. Overall been  very pleased with how things seem to be progressing. No  fevers, chills, nausea, or vomiting noted at this time. 02/13/19 on evaluation today patient actually appears to be doing excellent in regard to his toe ulcer which is almost completely close. Overall very pleased with that. There does not appear to be any signs of infection which is good news. Upon close inspection it appears that he has just a very slight opening still noted at the central portion of the toe although this is minimal. 02/16/19 on evaluation today patient is seen for early follow-up due to the fact that he tells me while at home he actually got up to go answer the door without putting on the walking boot part of his cast and subsequently damaged the total contact cast. It therefore comes in to have this removed and potentially replaced. Fortunately other than it given him a little bit of pain its far as pushing in on some areas there doesn't appear to be any injury once the cast was removed. 03/13/19 on evaluation today patient unfortunately presents for follow-up concerning his great toe ulcer on the left. He was discharged on 02/16/19 with the wound healed at that point. He tells me this remain so for several weeks or least a couple weeks before reopening. Fortunately there does not appear to be any signs of infection at this time. Nonetheless for the past week at least he's had the wound reopened and states it is been trying to keep pressure off of this but he has not been using the offloading shoe we previously gave him. Fortunately there's no evidence of infection at this time. No fevers, chills, nausea, or vomiting noted at this time. [name], [name] ([id number]) 03/17/19 on evaluation today patient actually appears to be doing rather well in regard to his toe also. Fact this appears to be significantly smaller this week even compared to what I saw last week on evaluation. He seems to have done very well for the offloading shoe and overall I'm very pleased with the progress  that is made. It may be that he doesn't even need the cast to get this to heal if he offloads this appropriately. 03/24/19 on evaluation today patient actually appears to be doing well in regard to his plantar foot ulcer. He's been tolerating the dressing changes without complication the collagen does seem to be beneficial for him. With that being said he is gonna require some miles sharp debridement today to clear away some of the callous's around the edge of the wound as well as the minimal Slough noted on the surface of the wound. 03/31/19 on evaluation today patient appears to be doing well in regard to his plantar great toe ulcer. He has been tolerating the dressing changes without complication which is good news. Fortunately there does not appear to be any signs of active infection at this time. Overall very pleased with how things seem to be progressing. 04/11/19 on evaluation today patient's tools are appears to be doing in my pinion roughly about the same as were things that previous. Fortunately there does not appear to be signs of active infection at this time which is good news. With that being said he has not been experiencing any pain at this time which is good news there is also No fevers, chills, nausea, or vomiting noted at this time. 04/18/19 on evaluation today patient's wound actually appears to be doing fairly well today he did not even really have a lot of  callous buildup. We have been debating on whether or not to reinitiate total contact cast. With that being said he seems to be doing fairly well this point with offloading in my pinion and again I think we need to come up with a way to get this healed that will also be sustainable for keeping it close. Again we were able to close it with the cast previous but again he has to be able to keep it such. For that reason we are working on trying to get him diabetic shoes he's waiting for the [place] to get in touch with the local company  that has to measure him for these do not want to have a cast on at such a time as when they need to actually measure him. 04/25/19 on evaluation today patient appears to be doing well in regard to his plantar foot specifically great toe ulcer. He's been tolerating the dressing changes without complication. Fortunately there's no signs of active infection at this time. Overall I'm very pleased with how things appear currently. 05/02/19 on evaluation today patient appears to actually be doing better in regard to his plantar foot ulcer. He shown signs of good improvement which is excellent news. She states he can walk better with the cast on the can with the offloading shoe that he had on previous. Fortunately there's no signs of active infection at this time. No fevers, chills, nausea, or vomiting noted at this time. 05/09/19 on evaluation today patient actually appears to be doing excellent. In fact he appears to be completely healed based on evaluation at this time. Fortunately there's no signs of infection and is having no discomfort. 06/08/19 this is a follow-up visit although the patient has been healed for almost a month but not quite. He tells me that in the past several days he noted some blood coming from his toe and he thought he should come in at this checked out. Fortunately he's having a pain. He also did get his custom orthotics shoes. With that being said he still appears to have developed this ulceration there is something he tells me that the orthotic specialist stated they could do to the toes to try to help out in this regard although he did not know exactly what it was we may need to check with [facility]. 06/12/19 on evaluation today patient actually appears to be doing great in regard to his foot ulcer. The wound on his toes shown signs of excellent improvement which is great news. With that being said he is not completely close yet the cast has done wonders for him and he is  definitely headed in the correct direction. No fevers, chills, nausea, or vomiting noted at this time. 06/22/19 on evaluation today fortunately patient actually appears to be healing quite well and in fact appears to be completely healed this is excellent news. Fortunately there's no signs of active infection he's been tolerating the total contact cast without complication. Overall I'm pleased with the progress that has been made. He also has his new custom shoes he brought was with him today. 07/04/19 on evaluation today patient actually appears to be doing a little bit worse in regard his toe ulcer unfortunately this has yet again reopened. It has only been about a week and a half since I've last seen him I really did not expect this to reopen this quickly. Nonetheless I did discuss with him his normal routine in order to see what may be causing this.  He has custom shoes. With that being said he apparently has not been using the custom shoes and even when he has used them he has not [name], [name]. ([id number]) even put the insert in there which is what was custom molded to him in order to appropriately offload high-pressure point areas. In fact his exact question to me was whether or not he needed to actually put those in they just came loose in the box and he had never installed them. Nonetheless it also came to light the he's been walking around in his bedroom slippers at home he tells me he does not go barefoot but he really has not been wearing his custom offloading/diabetic shoes. Nonetheless I do believe that this is why he continually reopens as far as the wound is concerned. I discussed this with the patient in great detail today for yet again although I previously had this discussion with him in the past. 07/11/19-Patient returns at 1 week for his left great toe wound which is actually doing really well, he has been using the insert to offload the toe and is very pleased with it.  This looks like this is working out really well for him 07/18/19 on evaluation today patient appears to be doing really about the same with regard to his ulcer on the right great toe. He's been tolerating the dressing changes without complication. Fortunately there's no signs of active infection at this time. With that being said I think that we're having a difficult time getting this to heal and I do believe that initiating treatment with the total contact cast to get this to close would be a good idea. The good news is he seems to be keeping this from worsening so hopefully that means that if we get it closed he can prevent this from reopening in the future. At this point my suggestion was that we go ahead and initiate treatment at both sites utilizing collagen. We will then subsequently put a piece of silver out and it between the toes of the right foot in the webbing between the fourth and fifth toe in order to help keep things dry and allow this to hopefully close and we heal without complication. The patient is in agreement with plan. Subsequently I am hopeful that both wounds will heal quite quickly we have been very close now with the ankle and again the toe has reopened but fortunately doesn't appear to be to bad at this point. No antibiotics were prescribed today as they did not appear to be necessary. 07/25/19 on evaluation today patient actually appears to be doing excellent in regard to his great toe ulcer. In fact this really appears to be almost completely healed if indeed it is not in fact healed. With that being said there is still the eighth small pinpoint area that could be open although I tend to doubt it. Nonetheless it is something that I would like to continue to watch for more week I do think it would be beneficial for [place] to go ahead and continue with the cast for one more week before deciding to discharge him especially in light of the issues he's had in the past. Specifically  with regard to the wound reopening rather quickly. 08/01/2019 upon evaluation today patient actually appears to be doing well with regard to his great toe ulcer. He has been tolerating the dressing changes without complication. Fortunately we think appears to be completely healed at this time which is great news.  I am very pleased with the progress up to this point. In fact he appears to be completely healed at this point. Readmission: 09/15/2019 patient presents today for reevaluation here in our clinic concerning issues that he is having with his left great toe as he did previous. We have not actually seen him since August 01, 2019. He had been doing very well until he noticed when walking into the bathroom with just the socks on the other night that he had a little bit of bleeding from the toe location. Subsequently he decided to make an appointment to come in as he did not want this to get significantly worse. There does not appear to be any signs of active infection he tells me as long as he was wearing his shoes he seemed to be doing okay is walking around not necessarily barefoot but just with socks on that I think may be his main problem at this time. 09/22/2019 on evaluation today patient actually appears to be doing quite well with regard to his great toe ulcer. This is making signs of good improvement even in the short amount of time we have been taking care of his toe since he came back. Is just been 1 week. Nonetheless the improvement in the wound size is excellent he also really has no depth and no evidence of infection which is great news. 09/29/2019 on evaluation today patient appears to be doing well with regard to his toe ulcer. This is showing signs of improvement in fact it was questioning whether or not today this actually was healed. However there did appear to be some callus covering the surface of the wound was removed there did appear to be a small pinpoint opening still in the  middle of the wound although this is minimal. 10/06/2019 on evaluation today patient appears to be doing excellent in regard to his toe ulcer which actually showed signs of complete Epithelization today. There is no evidence of active infection which is good news. Overall he has been tolerating the cast without complication but I think he is done with that as of now. Readmission: 11/28/2019 on evaluation today patient appears to be unfortunately here today again for another issue with his left great toe. He has been having some significant problems intermittently over the past year or really that I been seeing him. Were able to heal him and then subsequently things will get worse yet again. With that being said the patient tells me at this time that he SAMMY, TANNEN. ([id number]) began to have issues with drainage noted 2 weeks ago. He has no pain but again he is never really had any discomfort. No fevers, chills, nausea, vomiting, or diarrhea. He has been using his offloading shoes he tells me. 12/05/19 on evaluation today patient actually appears to be doing quite well with regard to the wound on his toe. He really hasn't been using the Pegasus offloading shoe for now. He tells me he actually forgot. With that being said there does not appear to be any signs of active infection at this time which is good news. No fevers, chills, nausea, or vomiting noted at this time. Electronic Signature(s) Signed: 12/05/2019 3:45:11 PM By: Worthy Keeler PA-C Entered By: Worthy Keeler on 12/05/2019 13:41:47 [place], [name] ([id number]) -------------------------------------------------------------------------------- Physical Exam Details Patient Name: [name] Date of Service: 12/05/2019 1:30 PM Medical Record Number: [id number] Patient Account Number: [id number] Date of Birth/Sex: Oct 03, 1946 (73 y.o. M) Treating RN: Montey Hora  Primary Care Provider: Frazier Richards  Other Clinician: Referring Provider: Frazier Richards Treating Provider/Extender: STONE III, HOYT Weeks in Treatment: 1 Constitutional Well-nourished and well-hydrated in no acute distress. Respiratory normal breathing without difficulty. Psychiatric this patient is able to make decisions and demonstrates good insight into disease process. Alert and Oriented x 3. pleasant and cooperative. Notes Patient's wound bed currently showed signs of failure granulation that was no significant Slough buildup which is good news as well. Overall I'm very pleased with how things seem to be progressing. In the past we've had to do a cast to get this to heal that may still be the case but I'm just not quite sure were there yet. Electronic Signature(s) Signed: 12/05/2019 3:45:11 PM By: Worthy Keeler PA-C Entered By: Worthy Keeler on 12/05/2019 13:42:08 [name] ([id number]) -------------------------------------------------------------------------------- Physician Orders Details Patient Name: [name] Date of Service: 12/05/2019 1:30 PM Medical Record Number: [id number] Patient Account Number: [id number] Date of Birth/Sex: 1946-11-02 (73 y.o. M) Treating RN: Montey Hora Primary Care Provider: Frazier Richards Other Clinician: Referring Provider: Frazier Richards Treating Provider/Extender: Melburn Hake, HOYT Weeks in Treatment: 1 Verbal / Phone Orders: No Diagnosis Coding Wound Cleansing Wound #1 Left Toe Great o May Shower, gently pat wound dry prior to applying new dressing. Anesthetic (add to Medication List) Wound #1 Left Toe Great o Topical Lidocaine 4% cream applied to wound bed prior to debridement (In Clinic Only). Primary Wound Dressing Wound #1 Left Toe Great o Silver Alginate Secondary Dressing Wound #1 Left Toe Great o Conform/Kerlix o Foam Dressing Change Frequency Wound #1 Left Toe Great o Change dressing every other  day. Follow-up Appointments o Return Appointment in 1 week. Off-Loading Wound #1 Left Toe Great o Open toe surgical shoe with peg assist. Electronic Signature(s) Signed: 12/05/2019 3:45:11 PM By: Worthy Keeler PA-C Signed: 12/05/2019 4:13:22 PM By: Montey Hora Entered By: Montey Hora on 12/05/2019 13:37:48 [place], [name] ([id number]) -------------------------------------------------------------------------------- Progress Note Details Patient Name: [name]. Date of Service: 12/05/2019 1:30 PM Medical Record Number: [id number] Patient Account Number: [id number] Date of Birth/Sex: [birth year] (73 y.o. M) Treating RN: Montey Hora Primary Care Provider: Frazier Richards Other Clinician: Referring Provider: Frazier Richards Treating Provider/Extender: Melburn Hake, HOYT Weeks in Treatment: 1 Subjective Chief Complaint Information obtained from Patient Left great toe ulcer History of Present Illness (HPI) 12/12/18 on evaluation today patient presents as a referral from his endocrinologist regarding issues that he has been having with his left great toe. Subsequently he has been seeing Dr. Cleda Mccreedy and Dr. Cleda Mccreedy has been the breeding the wound and in fact this was debrided this morning. The patient had an appointment there prior to coming here. Subsequently he states that even though he's been going for regular debridement after office things really do not seem to be showing signs of improvement unfortunately. He states that he is having some discomfort but nothing too significant. No fevers, chills, nausea, or vomiting noted at this time. The patient does have a history of hypertension, neuropathy, diabetes mellitus type II, and a history of stroke. Currently he's been using bacitracin on the toe and utilizing a Band-Aid. He tells me he's had this wound for two years. He cannot remember the last time he had an x-ray. 12/19/18 on evaluation today patient  appears to be doing well in regard to his plantar toe ulcer. He's been tolerating the dressing changes without complication. Fortunately there does not appear to be signs of infection at this time  which is good news. No fever chills noted. 01/19/19 has been one month since I last saw the patient as he was out of town. With that being said on evaluation today it does appear that he is going to require sharp debridement and I do believe that the total contact cast would benefit him as far as being applied at this time. He is in agreement that plan. 01/23/19 on evaluation today patient appears to be doing better in regard to his left great toe ulcer. He has been shown signs of improvement week by week and although this is slow it does seem to be doing fairly well which is good news. Overall very pleased in this regard. 01/31/19 on evaluation today patient presents for follow-up concerning his great toe ulcer. He was actually supposed to be here yesterday and he did come however he ended up having to leave due to his blood sugar dropping he states he just did not eat enough lunch. Fortunately seems to be doing much better today which is excellent news. Fortunately there's no evidence of infection at this time which is also good news. No fevers, chills, nausea, or vomiting noted at this time. Overall I feel like he is making excellent progress. 02/06/19 on evaluation today patient actually appears to be doing very well in regard to his plantar toe ulcer. He's been tolerating the dressing changes without complication. Fortunately there is no sign of infection at this time. Overall been very pleased with how things seem to be progressing. No fevers, chills, nausea, or vomiting noted at this time. 02/13/19 on evaluation today patient actually appears to be doing excellent in regard to his toe ulcer which is almost completely close. Overall very pleased with that. There does not appear to be any signs of  infection which is good news. Upon close inspection it appears that he has just a very slight opening still noted at the central portion of the toe although this is minimal. 02/16/19 on evaluation today patient is seen for early follow-up due to the fact that he tells me while at home he actually got up to go answer the door without putting on the walking boot part of his cast and subsequently damaged the total contact cast. It therefore comes in to have this removed and potentially replaced. Fortunately other than it given him a little bit of pain its far as pushing in on some areas there doesn't appear to be any injury once the cast was removed. [name], [name] ([id number]) 03/13/19 on evaluation today patient unfortunately presents for follow-up concerning his great toe ulcer on the left. He was discharged on 02/16/19 with the wound healed at that point. He tells me this remain so for several weeks or least a couple weeks before reopening. Fortunately there does not appear to be any signs of infection at this time. Nonetheless for the past week at least he's had the wound reopened and states it is been trying to keep pressure off of this but he has not been using the offloading shoe we previously gave him. Fortunately there's no evidence of infection at this time. No fevers, chills, nausea, or vomiting noted at this time. 03/17/19 on evaluation today patient actually appears to be doing rather well in regard to his toe also. Fact this appears to be significantly smaller this week even compared to what I saw last week on evaluation. He seems to have done very well for the offloading shoe and overall I'm very pleased with  the progress that is made. It may be that he doesn't even need the cast to get this to heal if he offloads this appropriately. 03/24/19 on evaluation today patient actually appears to be doing well in regard to his plantar foot ulcer. He's been tolerating the dressing changes  without complication the collagen does seem to be beneficial for him. With that being said he is gonna require some miles sharp debridement today to clear away some of the callous's around the edge of the wound as well as the minimal Slough noted on the surface of the wound. 03/31/19 on evaluation today patient appears to be doing well in regard to his plantar great toe ulcer. He has been tolerating the dressing changes without complication which is good news. Fortunately there does not appear to be any signs of active infection at this time. Overall very pleased with how things seem to be progressing. 04/11/19 on evaluation today patient's tools are appears to be doing in my pinion roughly about the same as were things that previous. Fortunately there does not appear to be signs of active infection at this time which is good news. With that being said he has not been experiencing any pain at this time which is good news there is also No fevers, chills, nausea, or vomiting noted at this time. 04/18/19 on evaluation today patient's wound actually appears to be doing fairly well today he did not even really have a lot of callous buildup. We have been debating on whether or not to reinitiate total contact cast. With that being said he seems to be doing fairly well this point with offloading in my pinion and again I think we need to come up with a way to get this healed that will also be sustainable for keeping it close. Again we were able to close it with the cast previous but again he has to be able to keep it such. For that reason we are working on trying to get him diabetic shoes he's waiting for the [facility] to get in touch with the local company that has to measure him for these do not want to have a cast on at such a time as when they need to actually measure him. 04/25/19 on evaluation today patient appears to be doing well in regard to his plantar foot specifically great toe ulcer. He's been tolerating  the dressing changes without complication. Fortunately there's no signs of active infection at this time. Overall I'm very pleased with how things appear currently. 05/02/19 on evaluation today patient appears to actually be doing better in regard to his plantar foot ulcer. He shown signs of good improvement which is excellent news. She states he can walk better with the cast on the can with the offloading shoe that he had on previous. Fortunately there's no signs of active infection at this time. No fevers, chills, nausea, or vomiting noted at this time. 05/09/19 on evaluation today patient actually appears to be doing excellent. In fact he appears to be completely healed based on evaluation at this time. Fortunately there's no signs of infection and is having no discomfort. 06/08/19 this is a follow-up visit although the patient has been healed for almost a month but not quite. He tells me that in the past several days he noted some blood coming from his toe and he thought he should come in at this checked out. Fortunately he's having a pain. He also did get his custom orthotics shoes. With that being  said he still appears to have developed this ulceration there is something he tells me that the orthotic specialist stated they could do to the toes to try to help out in this regard although he did not know exactly what it was we may need to check with [facility]. 06/12/19 on evaluation today patient actually appears to be doing great in regard to his foot ulcer. The wound on his toes shown signs of excellent improvement which is great news. With that being said he is not completely close yet the cast has done wonders for him and he is definitely headed in the correct direction. No fevers, chills, nausea, or vomiting noted at this time. 06/22/19 on evaluation today fortunately patient actually appears to be healing quite well and in fact appears to be completely healed this is excellent news.  Fortunately there's no signs of active infection he's been tolerating the total contact cast without complication. Overall I'm pleased with the progress that has been made. He also has his new custom shoes he brought was [name], [name] ([id number]) with him today. 07/04/19 on evaluation today patient actually appears to be doing a little bit worse in regard his toe ulcer unfortunately this has yet again reopened. It has only been about a week and a half since I've last seen him I really did not expect this to reopen this quickly. Nonetheless I did discuss with him his normal routine in order to see what may be causing this. He has custom shoes. With that being said he apparently has not been using the custom shoes and even when he has used them he has not even put the insert in there which is what was custom molded to him in order to appropriately offload high-pressure point areas. In fact his exact question to me was whether or not he needed to actually put those in they just came loose in the box and he had never installed them. Nonetheless it also came to light the he's been walking around in his bedroom slippers at home he tells me he does not go barefoot but he really has not been wearing his custom offloading/diabetic shoes. Nonetheless I do believe that this is why he continually reopens as far as the wound is concerned. I discussed this with the patient in great detail today for yet again although I previously had this discussion with him in the past. 07/11/19-Patient returns at 1 week for his left great toe wound which is actually doing really well, he has been using the insert to offload the toe and is very pleased with it. This looks like this is working out really well for him 07/18/19 on evaluation today patient appears to be doing really about the same with regard to his ulcer on the right great toe. He's been tolerating the dressing changes without complication. Fortunately  there's no signs of active infection at this time. With that being said I think that we're having a difficult time getting this to heal and I do believe that initiating treatment with the total contact cast to get this to close would be a good idea. The good news is he seems to be keeping this from worsening so hopefully that means that if we get it closed he can prevent this from reopening in the future. At this point my suggestion was that we go ahead and initiate treatment at both sites utilizing collagen. We will then subsequently put a piece of silver out and it between the  toes of the right foot in the webbing between the fourth and fifth toe in order to help keep things dry and allow this to hopefully close and we heal without complication. The patient is in agreement with plan. Subsequently I am hopeful that both wounds will heal quite quickly we have been very close now with the ankle and again the toe has reopened but fortunately doesn't appear to be to bad at this point. No antibiotics were prescribed today as they did not appear to be necessary. 07/25/19 on evaluation today patient actually appears to be doing excellent in regard to his great toe ulcer. In fact this really appears to be almost completely healed if indeed it is not in fact healed. With that being said there is still the eighth small pinpoint area that could be open although I tend to doubt it. Nonetheless it is something that I would like to continue to watch for more week I do think it would be beneficial for [place] to go ahead and continue with the cast for one more week before deciding to discharge him especially in light of the issues he's had in the past. Specifically with regard to the wound reopening rather quickly. 08/01/2019 upon evaluation today patient actually appears to be doing well with regard to his great toe ulcer. He has been tolerating the dressing changes without complication. Fortunately we think appears to  be completely healed at this time which is great news. I am very pleased with the progress up to this point. In fact he appears to be completely healed at this point. Readmission: 09/15/2019 patient presents today for reevaluation here in our clinic concerning issues that he is having with his left great toe as he did previous. We have not actually seen him since August 01, 2019. He had been doing very well until he noticed when walking into the bathroom with just the socks on the other night that he had a little bit of bleeding from the toe location. Subsequently he decided to make an appointment to come in as he did not want this to get significantly worse. There does not appear to be any signs of active infection he tells me as long as he was wearing his shoes he seemed to be doing okay is walking around not necessarily barefoot but just with socks on that I think may be his main problem at this time. 09/22/2019 on evaluation today patient actually appears to be doing quite well with regard to his great toe ulcer. This is making signs of good improvement even in the short amount of time we have been taking care of his toe since he came back. Is just been 1 week. Nonetheless the improvement in the wound size is excellent he also really has no depth and no evidence of infection which is great news. 09/29/2019 on evaluation today patient appears to be doing well with regard to his toe ulcer. This is showing signs of improvement in fact it was questioning whether or not today this actually was healed. However there did appear to be some callus covering the surface of the wound was removed there did appear to be a small pinpoint opening still in the middle of the wound although this is minimal. 10/06/2019 on evaluation today patient appears to be doing excellent in regard to his toe ulcer which actually showed signs of complete Epithelization today. There is no evidence of active infection which is good  news. Overall he has been tolerating the  cast without complication but I think he is done with that as of now. [name], [name] ([id number]) Readmission: 11/28/2019 on evaluation today patient appears to be unfortunately here today again for another issue with his left great toe. He has been having some significant problems intermittently over the past year or really that I been seeing him. Were able to heal him and then subsequently things will get worse yet again. With that being said the patient tells me at this time that he began to have issues with drainage noted 2 weeks ago. He has no pain but again he is never really had any discomfort. No fevers, chills, nausea, vomiting, or diarrhea. He has been using his offloading shoes he tells me. 12/05/19 on evaluation today patient actually appears to be doing quite well with regard to the wound on his toe. He really hasn't been using the Pegasus offloading shoe for now. He tells me he actually forgot. With that being said there does not appear to be any signs of active infection at this time which is good news. No fevers, chills, nausea, or vomiting noted at this time. Patient History Information obtained from Patient. Allergies No Known Allergies Family History Diabetes - Mother, Heart Disease - Father, Stroke - Father, No family history of Cancer, Hereditary Spherocytosis, Hypertension, Kidney Disease, Lung Disease, Seizures, Thyroid Problems, Tuberculosis. Social History Never smoker, Marital Status - Widowed, Alcohol Use - Never, Drug Use - No History, Caffeine Use - Moderate. Medical History Eyes Denies history of Cataracts, Glaucoma, Optic Neuritis Ear/Nose/Mouth/Throat Denies history of Chronic sinus problems/congestion, Middle ear problems Hematologic/Lymphatic Denies history of Anemia, Hemophilia, Human Immunodeficiency Virus, Lymphedema Respiratory Denies history of Aspiration, Asthma, Chronic Obstructive Pulmonary Disease  (COPD), Pneumothorax, Sleep Apnea, Tuberculosis Cardiovascular Patient has history of Hypertension Denies history of Angina, Arrhythmia, Congestive Heart Failure, Coronary Artery Disease, Deep Vein Thrombosis, Hypotension, Myocardial Infarction, Peripheral Arterial Disease, Peripheral Venous Disease, Phlebitis, Vasculitis Gastrointestinal Denies history of Cirrhosis , Colitis, Crohn s, Hepatitis A, Hepatitis B, Hepatitis C Endocrine Patient has history of Type II Diabetes Denies history of Type I Diabetes Genitourinary Denies history of End Stage Renal Disease Immunological Denies history of Lupus Erythematosus, Raynaud s, Scleroderma Integumentary (Skin) Denies history of History of Burn, History of pressure wounds Musculoskeletal Denies history of Gout, Rheumatoid Arthritis, Osteoarthritis, Osteomyelitis Neurologic Denies history of Dementia, Neuropathy, Quadriplegia, Paraplegia, Seizure Disorder Oncologic [name], [name] ([id number]) Denies history of Received Chemotherapy, Received Radiation Objective Constitutional Well-nourished and well-hydrated in no acute distress. Vitals Time Taken: 1:27 PM, Height: 72 in, Weight: 254 lbs, BMI: 34.4, Temperature: 98.4 F, Pulse: 88 bpm, Respiratory Rate: 16 breaths/min, Blood Pressure: 175/84 mmHg. Respiratory normal breathing without difficulty. Psychiatric this patient is able to make decisions and demonstrates good insight into disease process. Alert and Oriented x 3. pleasant and cooperative. General Notes: Patient's wound bed currently showed signs of failure granulation that was no significant Slough buildup which is good news as well. Overall I'm very pleased with how things seem to be progressing. In the past we've had to do a cast to get this to heal that may still be the case but I'm just not quite sure were there yet. Integumentary (Hair, Skin) Wound #1 status is Open. Original cause of wound was Gradually Appeared.  The wound is located on the Left Toe Great. The wound measures 0.4cm length x 0.4cm width x 0.3cm depth; 0.126cm^2 area and 0.038cm^3 volume. There is Fat Layer (Subcutaneous Tissue) Exposed exposed. There is a medium amount  of serosanguineous drainage noted. The wound margin is flat and intact. There is no granulation within the wound bed. There is a large (67-100%) amount of necrotic tissue within the wound bed including Eschar and Adherent Slough. Plan Wound Cleansing: Wound #1 Left Toe Great: May Shower, gently pat wound dry prior to applying new dressing. Anesthetic (add to Medication List): Wound #1 Left Toe Great: Topical Lidocaine 4% cream applied to wound bed prior to debridement (In Clinic Only). Primary Wound Dressing: Wound #1 Left Toe Great: Silver Alginate Secondary Dressing: Wound #1 Left Toe Great: Conform/Kerlix [name], [name] ([id number]) Foam Dressing Change Frequency: Wound #1 Left Toe Great: Change dressing every other day. Follow-up Appointments: Return Appointment in 1 week. Off-Loading: Wound #1 Left Toe Great: Open toe surgical shoe with peg assist. I'm in a recommend currently that we go ahead and continue with the wound care measures including the silver out and addressing and then offloading foam. He's in agreement with this plan. We will subsequently see were things stand at follow- up. We may still need to look into initiation the total contact cast but again right now we're not quite at that point in my opinion. Please see above for specific wound care orders. We will see patient for re-evaluation in 1 week(s) here in the clinic. If anything worsens or changes patient will contact our office for additional recommendations. Patient was seen by way of zoom as I was on quarantine and could not be present in the office he was however see in the office by the nurses today. Electronic Signature(s) Signed: 12/05/2019 3:45:11 PM By: Worthy Keeler  PA-C Entered By: Worthy Keeler on 12/05/2019 13:43:14 [place], [name] ([id number]) -------------------------------------------------------------------------------- ROS/PFSH Details Patient Name: [name] Date of Service: 12/05/2019 1:30 PM Medical Record Number: [id number] Patient Account Number: [id number] Date of Birth/Sex: 16-Nov-1946 (73 y.o. M) Treating RN: Army Melia Primary Care Provider: Frazier Richards Other Clinician: Referring Provider: Frazier Richards Treating Provider/Extender: Melburn Hake, HOYT Weeks in Treatment: 1 Information Obtained From Patient Eyes Medical History: Negative for: Cataracts; Glaucoma; Optic Neuritis Ear/Nose/Mouth/Throat Medical History: Negative for: Chronic sinus problems/congestion; Middle ear problems Hematologic/Lymphatic Medical History: Negative for: Anemia; Hemophilia; Human Immunodeficiency Virus; Lymphedema Respiratory Medical History: Negative for: Aspiration; Asthma; Chronic Obstructive Pulmonary Disease (COPD); Pneumothorax; Sleep Apnea; Tuberculosis Cardiovascular Medical History: Positive for: Hypertension Negative for: Angina; Arrhythmia; Congestive Heart Failure; Coronary Artery Disease; Deep Vein Thrombosis; Hypotension; Myocardial Infarction; Peripheral Arterial Disease; Peripheral Venous Disease; Phlebitis; Vasculitis Gastrointestinal Medical History: Negative for: Cirrhosis ; Colitis; Crohnos; Hepatitis A; Hepatitis B; Hepatitis C Endocrine Medical History: Positive for: Type II Diabetes Negative for: Type I Diabetes Time with diabetes: 1990 Treated with: Insulin Blood sugar tested every day: Yes Tested : Genitourinary Medical History: Negative for: End Stage Renal Disease [name], [name] ([id number]) Immunological Medical History: Negative for: Lupus Erythematosus; Raynaudos; Scleroderma Integumentary (Skin) Medical History: Negative for: History of Burn; History of pressure  wounds Musculoskeletal Medical History: Negative for: Gout; Rheumatoid Arthritis; Osteoarthritis; Osteomyelitis Neurologic Medical History: Negative for: Dementia; Neuropathy; Quadriplegia; Paraplegia; Seizure Disorder Oncologic Medical History: Negative for: Received Chemotherapy; Received Radiation Immunizations Pneumococcal Vaccine: Received Pneumococcal Vaccination: Yes Implantable Devices None Family and Social History Cancer: No; Diabetes: Yes - Mother; Heart Disease: Yes - Father; Hereditary Spherocytosis: No; Hypertension: No; Kidney Disease: No; Lung Disease: No; Seizures: No; Stroke: Yes - Father; Thyroid Problems: No; Tuberculosis: No; Never smoker; Marital Status - Widowed; Alcohol Use: Never; Drug Use: No History; Caffeine Use: Moderate; Financial Concerns:  No; Food, Clothing or Shelter Needs: No; Support System Lacking: No; Transportation Concerns: No Electronic Signature(s) Signed: 12/05/2019 3:45:11 PM By: Worthy Keeler PA-C Signed: 12/05/2019 4:00:27 PM By: Army Melia Entered By: Army Melia on 12/05/2019 13:31:41 [place], [name] ([id number]) -------------------------------------------------------------------------------- SuperBill Details Patient Name: [name]. Date of Service: 12/05/2019 Medical Record Number: [id number] Patient Account Number: [id number] Date of Birth/Sex: October 17, 1946 (73 y.o. M) Treating RN: Montey Hora Primary Care Provider: Frazier Richards Other Clinician: Referring Provider: Frazier Richards Treating Provider/Extender: Melburn Hake, HOYT Weeks in Treatment: 1 Diagnosis Coding ICD-10 Codes Code Description E11.621 Type 2 diabetes mellitus with foot ulcer L97.522 Non-pressure chronic ulcer of other part of left foot with fat layer exposed I10 Essential (primary) hypertension E11.43 Type 2 diabetes mellitus with diabetic autonomic (poly)neuropathy Facility Procedures CPT4 Code: [id number] Description: 99213 -  WOUND CARE VISIT-LEV 3 EST PT Modifier: Quantity: 1 CPT4 Code: [id number] Description: [id number] Telehealth originating site facility fee. Modifier: Quantity: 1 Physician Procedures CPT4 Code: [id number] Description: [id number] - WC PHYS LEVEL 3 - EST PT ICD-10 Diagnosis Description E11.621 Type 2 diabetes mellitus with foot ulcer L97.522 Non-pressure chronic ulcer of other part of left foot with fa I10 Essential (primary) hypertension E11.43 Type 2 diabetes  mellitus with diabetic autonomic (poly)neurop Modifier: t layer exposed athy Quantity: 1 Electronic Signature(s) Signed: 12/05/2019 3:09:55 PM By: Gretta Cool, BSN, RN, CWS, Kim RN, BSN Signed: 12/05/2019 3:45:11 PM By: Worthy Keeler PA-C Entered By: Gretta Cool, BSN, RN, CWS, Kim on 12/05/2019 15:09:55

## 2019-12-12 ENCOUNTER — Encounter: Payer: Medicare Other | Admitting: Physician Assistant

## 2019-12-12 ENCOUNTER — Other Ambulatory Visit: Payer: Self-pay

## 2019-12-12 DIAGNOSIS — E11621 Type 2 diabetes mellitus with foot ulcer: Secondary | ICD-10-CM | POA: Diagnosis not present

## 2019-12-12 NOTE — Progress Notes (Addendum)
LINDO, GISLASON ([id number]) Visit Report for 12/12/2019 Chief Complaint Document Details Patient Name: [name], [name]. Date of Service: 12/12/2019 1:30 PM Medical Record Number: [id number] Patient Account Number: [id number] Date of Birth/Sex: 12/11/1946 (73 y.o. M) Treating RN: Montey Hora Primary Care Provider: Frazier Richards Other Clinician: Referring Provider: Frazier Richards Treating Provider/Extender: Melburn Hake, Cambrey Lupi Weeks in Treatment: 2 Information Obtained from: Patient Chief Complaint Left great toe ulcer Electronic Signature(s) Signed: 12/12/2019 1:26:19 PM By: Worthy Keeler PA-C Entered By: Worthy Keeler on 12/12/2019 13:26:18 [place], [name] ([id number]) -------------------------------------------------------------------------------- Debridement Details Patient Name: [name] Date of Service: 12/12/2019 1:30 PM Medical Record Number: [id number] Patient Account Number: [id number] Date of Birth/Sex: 06-26-1946 (73 y.o. M) Treating RN: Montey Hora Primary Care Provider: Frazier Richards Other Clinician: Referring Provider: Frazier Richards Treating Provider/Extender: Melburn Hake, Zakariyah Freimark Weeks in Treatment: 2 Debridement Performed for Wound #1 Left Toe Great Assessment: Performed By: Physician STONE III, Jermey Closs E., PA-C Debridement Type: Debridement Severity of Tissue Pre Fat layer exposed Debridement: Level of Consciousness (Pre- Awake and Alert procedure): Pre-procedure Verification/Time Yes - 14:08 Out Taken: Start Time: 14:08 Pain Control: Lidocaine 4% Topical Solution Total Area Debrided (L x W): 0.5 (cm) x 0.3 (cm) = 0.15 (cm) Tissue and other material Viable, Non-Viable, Callus, Slough, Subcutaneous, Slough debrided: Level: Skin/Subcutaneous Tissue Debridement Description: Excisional Instrument: Curette Bleeding: Minimum Hemostasis Achieved: Pressure End Time: 14:12 Procedural Pain: 0 Post  Procedural Pain: 0 Response to Treatment: Procedure was tolerated well Level of Consciousness Awake and Alert (Post-procedure): Post Debridement Measurements of Total Wound Length: (cm) 0.6 Width: (cm) 0.4 Depth: (cm) 0.2 Volume: (cm) 0.038 Character of Wound/Ulcer Post Debridement: Improved Severity of Tissue Post Debridement: Fat layer exposed Post Procedure Diagnosis Same as Pre-procedure Electronic Signature(s) Signed: 12/12/2019 4:30:49 PM By: Montey Hora Signed: 12/13/2019 6:11:58 PM By: Worthy Keeler PA-C Entered By: Montey Hora on 12/12/2019 14:12:32 [place] ([id number]) -------------------------------------------------------------------------------- HPI Details Patient Name: [name] Date of Service: 12/12/2019 1:30 PM Medical Record Number: [id number] Patient Account Number: [id number] Date of Birth/Sex: [birth year] (73 y.o. M) Treating RN: Montey Hora Primary Care Provider: Frazier Richards Other Clinician: Referring Provider: Frazier Richards Treating Provider/Extender: Melburn Hake, Sparkle Aube Weeks in Treatment: 2 History of Present Illness HPI Description: 12/12/18 on evaluation today patient presents as a referral from his endocrinologist regarding issues that he has been having with his left great toe. Subsequently he has been seeing Dr. Cleda Mccreedy and Dr. Cleda Mccreedy has been the breeding the wound and in fact this was debrided this morning. The patient had an appointment there prior to coming here. Subsequently he states that even though he's been going for regular debridement after office things really do not seem to be showing signs of improvement unfortunately. He states that he is having some discomfort but nothing too significant. No fevers, chills, nausea, or vomiting noted at this time. The patient does have a history of hypertension, neuropathy, diabetes mellitus type II, and a history of stroke. Currently he's been using  bacitracin on the toe and utilizing a Band-Aid. He tells me he's had this wound for two years. He cannot remember the last time he had an x-ray. 12/19/18 on evaluation today patient appears to be doing well in regard to his plantar toe ulcer. He's been tolerating the dressing changes without complication. Fortunately there does not appear to be signs of infection at this time which is good news. No fever chills noted. 01/19/19 has been one month since I last  saw the patient as he was out of town. With that being said on evaluation today it does appear that he is going to require sharp debridement and I do believe that the total contact cast would benefit him as far as being applied at this time. He is in agreement that plan. 01/23/19 on evaluation today patient appears to be doing better in regard to his left great toe ulcer. He has been shown signs of improvement week by week and although this is slow it does seem to be doing fairly well which is good news. Overall very pleased in this regard. 01/31/19 on evaluation today patient presents for follow-up concerning his great toe ulcer. He was actually supposed to be here yesterday and he did come however he ended up having to leave due to his blood sugar dropping he states he just did not eat enough lunch. Fortunately seems to be doing much better today which is excellent news. Fortunately there's no evidence of infection at this time which is also good news. No fevers, chills, nausea, or vomiting noted at this time. Overall I feel like he is making excellent progress. 02/06/19 on evaluation today patient actually appears to be doing very well in regard to his plantar toe ulcer. He's been tolerating the dressing changes without complication. Fortunately there is no sign of infection at this time. Overall been very pleased with how things seem to be progressing. No fevers, chills, nausea, or vomiting noted at this time. 02/13/19 on evaluation today  patient actually appears to be doing excellent in regard to his toe ulcer which is almost completely close. Overall very pleased with that. There does not appear to be any signs of infection which is good news. Upon close inspection it appears that he has just a very slight opening still noted at the central portion of the toe although this is minimal. 02/16/19 on evaluation today patient is seen for early follow-up due to the fact that he tells me while at home he actually got up to go answer the door without putting on the walking boot part of his cast and subsequently damaged the total contact cast. It therefore comes in to have this removed and potentially replaced. Fortunately other than it given him a little bit of pain its far as pushing in on some areas there doesn't appear to be any injury once the cast was removed. 03/13/19 on evaluation today patient unfortunately presents for follow-up concerning his great toe ulcer on the left. He was discharged on 02/16/19 with the wound healed at that point. He tells me this remain so for several weeks or least a couple weeks before reopening. Fortunately there does not appear to be any signs of infection at this time. Nonetheless for the past week at least he's had the wound reopened and states it is been trying to keep pressure off of this but he has not been using the offloading shoe we previously gave him. Fortunately there's no evidence of infection at this time. No fevers, chills, nausea, or vomiting noted at this time. [name], [name] ([id number]) 03/17/19 on evaluation today patient actually appears to be doing rather well in regard to his toe also. Fact this appears to be significantly smaller this week even compared to what I saw last week on evaluation. He seems to have done very well for the offloading shoe and overall I'm very pleased with the progress that is made. It may be that he doesn't even need the cast to  get this to heal if  he offloads this appropriately. 03/24/19 on evaluation today patient actually appears to be doing well in regard to his plantar foot ulcer. He's been tolerating the dressing changes without complication the collagen does seem to be beneficial for him. With that being said he is gonna require some miles sharp debridement today to clear away some of the callous's around the edge of the wound as well as the minimal Slough noted on the surface of the wound. 03/31/19 on evaluation today patient appears to be doing well in regard to his plantar great toe ulcer. He has been tolerating the dressing changes without complication which is good news. Fortunately there does not appear to be any signs of active infection at this time. Overall very pleased with how things seem to be progressing. 04/11/19 on evaluation today patient's tools are appears to be doing in my pinion roughly about the same as were things that previous. Fortunately there does not appear to be signs of active infection at this time which is good news. With that being said he has not been experiencing any pain at this time which is good news there is also No fevers, chills, nausea, or vomiting noted at this time. 04/18/19 on evaluation today patient's wound actually appears to be doing fairly well today he did not even really have a lot of callous buildup. We have been debating on whether or not to reinitiate total contact cast. With that being said he seems to be doing fairly well this point with offloading in my pinion and again I think we need to come up with a way to get this healed that will also be sustainable for keeping it close. Again we were able to close it with the cast previous but again he has to be able to keep it such. For that reason we are working on trying to get him diabetic shoes he's waiting for the [place] to get in touch with the local company that has to measure him for these do not want to have a cast on at such a time as  when they need to actually measure him. 04/25/19 on evaluation today patient appears to be doing well in regard to his plantar foot specifically great toe ulcer. He's been tolerating the dressing changes without complication. Fortunately there's no signs of active infection at this time. Overall I'm very pleased with how things appear currently. 05/02/19 on evaluation today patient appears to actually be doing better in regard to his plantar foot ulcer. He shown signs of good improvement which is excellent news. She states he can walk better with the cast on the can with the offloading shoe that he had on previous. Fortunately there's no signs of active infection at this time. No fevers, chills, nausea, or vomiting noted at this time. 05/09/19 on evaluation today patient actually appears to be doing excellent. In fact he appears to be completely healed based on evaluation at this time. Fortunately there's no signs of infection and is having no discomfort. 06/08/19 this is a follow-up visit although the patient has been healed for almost a month but not quite. He tells me that in the past several days he noted some blood coming from his toe and he thought he should come in at this checked out. Fortunately he's having a pain. He also did get his custom orthotics shoes. With that being said he still appears to have developed this ulceration there is something he tells me that  the orthotic specialist stated they could do to the toes to try to help out in this regard although he did not know exactly what it was we may need to check with [facility]. 06/12/19 on evaluation today patient actually appears to be doing great in regard to his foot ulcer. The wound on his toes shown signs of excellent improvement which is great news. With that being said he is not completely close yet the cast has done wonders for him and he is definitely headed in the correct direction. No fevers, chills, nausea, or vomiting noted  at this time. 06/22/19 on evaluation today fortunately patient actually appears to be healing quite well and in fact appears to be completely healed this is excellent news. Fortunately there's no signs of active infection he's been tolerating the total contact cast without complication. Overall I'm pleased with the progress that has been made. He also has his new custom shoes he brought was with him today. 07/04/19 on evaluation today patient actually appears to be doing a little bit worse in regard his toe ulcer unfortunately this has yet again reopened. It has only been about a week and a half since I've last seen him I really did not expect this to reopen this quickly. Nonetheless I did discuss with him his normal routine in order to see what may be causing this. He has custom shoes. With that being said he apparently has not been using the custom shoes and even when he has used them he has not [name], [name]. ([id number]) even put the insert in there which is what was custom molded to him in order to appropriately offload high-pressure point areas. In fact his exact question to me was whether or not he needed to actually put those in they just came loose in the box and he had never installed them. Nonetheless it also came to light the he's been walking around in his bedroom slippers at home he tells me he does not go barefoot but he really has not been wearing his custom offloading/diabetic shoes. Nonetheless I do believe that this is why he continually reopens as far as the wound is concerned. I discussed this with the patient in great detail today for yet again although I previously had this discussion with him in the past. 07/11/19-Patient returns at 1 week for his left great toe wound which is actually doing really well, he has been using the insert to offload the toe and is very pleased with it. This looks like this is working out really well for him 07/18/19 on evaluation today patient  appears to be doing really about the same with regard to his ulcer on the right great toe. He's been tolerating the dressing changes without complication. Fortunately there's no signs of active infection at this time. With that being said I think that we're having a difficult time getting this to heal and I do believe that initiating treatment with the total contact cast to get this to close would be a good idea. The good news is he seems to be keeping this from worsening so hopefully that means that if we get it closed he can prevent this from reopening in the future. At this point my suggestion was that we go ahead and initiate treatment at both sites utilizing collagen. We will then subsequently put a piece of silver out and it between the toes of the right foot in the webbing between the fourth and fifth toe in order  to help keep things dry and allow this to hopefully close and we heal without complication. The patient is in agreement with plan. Subsequently I am hopeful that both wounds will heal quite quickly we have been very close now with the ankle and again the toe has reopened but fortunately doesn't appear to be to bad at this point. No antibiotics were prescribed today as they did not appear to be necessary. 07/25/19 on evaluation today patient actually appears to be doing excellent in regard to his great toe ulcer. In fact this really appears to be almost completely healed if indeed it is not in fact healed. With that being said there is still the eighth small pinpoint area that could be open although I tend to doubt it. Nonetheless it is something that I would like to continue to watch for more week I do think it would be beneficial for [place] to go ahead and continue with the cast for one more week before deciding to discharge him especially in light of the issues he's had in the past. Specifically with regard to the wound reopening rather quickly. 08/01/2019 upon evaluation today patient  actually appears to be doing well with regard to his great toe ulcer. He has been tolerating the dressing changes without complication. Fortunately we think appears to be completely healed at this time which is great news. I am very pleased with the progress up to this point. In fact he appears to be completely healed at this point. Readmission: 09/15/2019 patient presents today for reevaluation here in our clinic concerning issues that he is having with his left great toe as he did previous. We have not actually seen him since August 01, 2019. He had been doing very well until he noticed when walking into the bathroom with just the socks on the other night that he had a little bit of bleeding from the toe location. Subsequently he decided to make an appointment to come in as he did not want this to get significantly worse. There does not appear to be any signs of active infection he tells me as long as he was wearing his shoes he seemed to be doing okay is walking around not necessarily barefoot but just with socks on that I think may be his main problem at this time. 09/22/2019 on evaluation today patient actually appears to be doing quite well with regard to his great toe ulcer. This is making signs of good improvement even in the short amount of time we have been taking care of his toe since he came back. Is just been 1 week. Nonetheless the improvement in the wound size is excellent he also really has no depth and no evidence of infection which is great news. 09/29/2019 on evaluation today patient appears to be doing well with regard to his toe ulcer. This is showing signs of improvement in fact it was questioning whether or not today this actually was healed. However there did appear to be some callus covering the surface of the wound was removed there did appear to be a small pinpoint opening still in the middle of the wound although this is minimal. 10/06/2019 on evaluation today patient appears  to be doing excellent in regard to his toe ulcer which actually showed signs of complete Epithelization today. There is no evidence of active infection which is good news. Overall he has been tolerating the cast without complication but I think he is done with that as of now. Readmission: 11/28/2019  on evaluation today patient appears to be unfortunately here today again for another issue with his left great toe. He has been having some significant problems intermittently over the past year or really that I been seeing him. Were able to heal him and then subsequently things will get worse yet again. With that being said the patient tells me at this time that he [name], [name]. ([id number]) began to have issues with drainage noted 2 weeks ago. He has no pain but again he is never really had any discomfort. No fevers, chills, nausea, vomiting, or diarrhea. He has been using his offloading shoes he tells me. 12/05/19 on evaluation today patient actually appears to be doing quite well with regard to the wound on his toe. He really hasn't been using the Pegasus offloading shoe for now. He tells me he actually forgot. With that being said there does not appear to be any signs of active infection at this time which is good news. No fevers, chills, nausea, or vomiting noted at this time. 12/12/2019 on evaluation today the patient appears to be doing quite well with regard to his toe ulcer. This does not seem to be showing any signs of worsening he has been using his custom shoes and that seems to be beneficial for him. Fortunately there is no sign of active infection at this point. No fevers, chills, nausea, vomiting, or diarrhea. Electronic Signature(s) Signed: 12/12/2019 2:19:05 PM By: Worthy Keeler PA-C Entered By: Worthy Keeler on 12/12/2019 14:19:04 [place], [name] ([id number]) -------------------------------------------------------------------------------- Physical Exam  Details Patient Name: [name] Date of Service: 12/12/2019 1:30 PM Medical Record Number: [id number] Patient Account Number: [id number] Date of Birth/Sex: 1946-01-14 (73 y.o. M) Treating RN: Montey Hora Primary Care Provider: Frazier Richards Other Clinician: Referring Provider: Frazier Richards Treating Provider/Extender: STONE III, Denishia Citro Weeks in Treatment: 2 Constitutional Well-nourished and well-hydrated in no acute distress. Respiratory normal breathing without difficulty. Psychiatric this patient is able to make decisions and demonstrates good insight into disease process. Alert and Oriented x 3. pleasant and cooperative. Notes Upon inspection today patient's wound did require some sharp debridement to clear away some of the slough and necrotic debris from the surface of the wound which he tolerated today without complication. Post debridement wound bed does appear to be doing much better which is great news Electronic Signature(s) Signed: 12/13/2019 5:52:28 PM By: Worthy Keeler PA-C Entered By: Worthy Keeler on 12/13/2019 17:52:28 [place], [name] ([id number]) -------------------------------------------------------------------------------- Physician Orders Details Patient Name: [name]. Date of Service: 12/12/2019 1:30 PM Medical Record Number: [id number] Patient Account Number: [id number] Date of Birth/Sex: 1946-08-08 (73 y.o. M) Treating RN: Montey Hora Primary Care Provider: Frazier Richards Other Clinician: Referring Provider: Frazier Richards Treating Provider/Extender: Melburn Hake, Mehar Kirkwood Weeks in Treatment: 2 Verbal / Phone Orders: No Diagnosis Coding ICD-10 Coding Code Description E11.621 Type 2 diabetes mellitus with foot ulcer L97.522 Non-pressure chronic ulcer of other part of left foot with fat layer exposed I10 Essential (primary) hypertension E11.43 Type 2 diabetes mellitus with diabetic autonomic  (poly)neuropathy Wound Cleansing Wound #1 Left Toe Great o May Shower, gently pat wound dry prior to applying new dressing. Anesthetic (add to Medication List) Wound #1 Left Toe Great o Topical Lidocaine 4% cream applied to wound bed prior to debridement (In Clinic Only). Primary Wound Dressing Wound #1 Left Toe Great o Silver Alginate Secondary Dressing Wound #1 Left Toe Great o Conform/Kerlix o Foam Dressing Change Frequency Wound #1 Left Toe Epifania Gore  o Change dressing every other day. Follow-up Appointments o Return Appointment in 1 week. Off-Loading Wound #1 Left Toe Great o Open toe surgical shoe with peg assist. Electronic Signature(s) Signed: 12/12/2019 4:30:49 PM By: Montey Hora Signed: 12/13/2019 6:11:58 PM By: Worthy Keeler PA-C Entered By: Montey Hora on 12/12/2019 14:13:19 [place], [name] ([id number]) [name], [name] ([id number]) -------------------------------------------------------------------------------- Problem List Details Patient Name: [name], [name]. Date of Service: 12/12/2019 1:30 PM Medical Record Number: [id number] Patient Account Number: [id number] Date of Birth/Sex: [birth year] (73 y.o. M) Treating RN: Montey Hora Primary Care Provider: Frazier Richards Other Clinician: Referring Provider: Frazier Richards Treating Provider/Extender: Melburn Hake, Ruthanne Mcneish Weeks in Treatment: 2 Active Problems ICD-10 Evaluated Encounter Code Description Active Date Today Diagnosis E11.621 Type 2 diabetes mellitus with foot ulcer 11/28/2019 No Yes L97.522 Non-pressure chronic ulcer of other part of left foot with fat 11/28/2019 No Yes layer exposed I10 Essential (primary) hypertension 11/28/2019 No Yes E11.43 Type 2 diabetes mellitus with diabetic autonomic (poly) 11/28/2019 No Yes neuropathy Inactive Problems Resolved Problems Electronic Signature(s) Signed: 12/12/2019 1:26:12 PM By: Worthy Keeler PA-C Entered By:  Worthy Keeler on 12/12/2019 13:26:11 [place], [name] ([id number]) -------------------------------------------------------------------------------- Progress Note Details Patient Name: [name] Date of Service: 12/12/2019 1:30 PM Medical Record Number: [id number] Patient Account Number: [id number] Date of Birth/Sex: 1946/01/23 (73 y.o. M) Treating RN: Montey Hora Primary Care Provider: Frazier Richards Other Clinician: Referring Provider: Frazier Richards Treating Provider/Extender: Melburn Hake, [place] Freyre Weeks in Treatment: 2 Subjective Chief Complaint Information obtained from Patient Left great toe ulcer History of Present Illness (HPI) 12/12/18 on evaluation today patient presents as a referral from his endocrinologist regarding issues that he has been having with his left great toe. Subsequently he has been seeing Dr. Cleda Mccreedy and Dr. Cleda Mccreedy has been the breeding the wound and in fact this was debrided this morning. The patient had an appointment there prior to coming here. Subsequently he states that even though he's been going for regular debridement after office things really do not seem to be showing signs of improvement unfortunately. He states that he is having some discomfort but nothing too significant. No fevers, chills, nausea, or vomiting noted at this time. The patient does have a history of hypertension, neuropathy, diabetes mellitus type II, and a history of stroke. Currently he's been using bacitracin on the toe and utilizing a Band-Aid. He tells me he's had this wound for two years. He cannot remember the last time he had an x-ray. 12/19/18 on evaluation today patient appears to be doing well in regard to his plantar toe ulcer. He's been tolerating the dressing changes without complication. Fortunately there does not appear to be signs of infection at this time which is good news. No fever chills noted. 01/19/19 has been one month since I last saw the  patient as he was out of town. With that being said on evaluation today it does appear that he is going to require sharp debridement and I do believe that the total contact cast would benefit him as far as being applied at this time. He is in agreement that plan. 01/23/19 on evaluation today patient appears to be doing better in regard to his left great toe ulcer. He has been shown signs of improvement week by week and although this is slow it does seem to be doing fairly well which is good news. Overall very pleased in this regard. 01/31/19 on evaluation today patient presents for follow-up concerning his great toe ulcer.  He was actually supposed to be here yesterday and he did come however he ended up having to leave due to his blood sugar dropping he states he just did not eat enough lunch. Fortunately seems to be doing much better today which is excellent news. Fortunately there's no evidence of infection at this time which is also good news. No fevers, chills, nausea, or vomiting noted at this time. Overall I feel like he is making excellent progress. 02/06/19 on evaluation today patient actually appears to be doing very well in regard to his plantar toe ulcer. He's been tolerating the dressing changes without complication. Fortunately there is no sign of infection at this time. Overall been very pleased with how things seem to be progressing. No fevers, chills, nausea, or vomiting noted at this time. 02/13/19 on evaluation today patient actually appears to be doing excellent in regard to his toe ulcer which is almost completely close. Overall very pleased with that. There does not appear to be any signs of infection which is good news. Upon close inspection it appears that he has just a very slight opening still noted at the central portion of the toe although this is minimal. 02/16/19 on evaluation today patient is seen for early follow-up due to the fact that he tells me while at home he actually  got up to go answer the door without putting on the walking boot part of his cast and subsequently damaged the total contact cast. It therefore comes in to have this removed and potentially replaced. Fortunately other than it given him a little bit of pain its far as pushing in on some areas there doesn't appear to be any injury once the cast was removed. [name], [name] ([id number]) 03/13/19 on evaluation today patient unfortunately presents for follow-up concerning his great toe ulcer on the left. He was discharged on 02/16/19 with the wound healed at that point. He tells me this remain so for several weeks or least a couple weeks before reopening. Fortunately there does not appear to be any signs of infection at this time. Nonetheless for the past week at least he's had the wound reopened and states it is been trying to keep pressure off of this but he has not been using the offloading shoe we previously gave him. Fortunately there's no evidence of infection at this time. No fevers, chills, nausea, or vomiting noted at this time. 03/17/19 on evaluation today patient actually appears to be doing rather well in regard to his toe also. Fact this appears to be significantly smaller this week even compared to what I saw last week on evaluation. He seems to have done very well for the offloading shoe and overall I'm very pleased with the progress that is made. It may be that he doesn't even need the cast to get this to heal if he offloads this appropriately. 03/24/19 on evaluation today patient actually appears to be doing well in regard to his plantar foot ulcer. He's been tolerating the dressing changes without complication the collagen does seem to be beneficial for him. With that being said he is gonna require some miles sharp debridement today to clear away some of the callous's around the edge of the wound as well as the minimal Slough noted on the surface of the wound. 03/31/19 on evaluation  today patient appears to be doing well in regard to his plantar great toe ulcer. He has been tolerating the dressing changes without complication which is good news. Fortunately there  does not appear to be any signs of active infection at this time. Overall very pleased with how things seem to be progressing. 04/11/19 on evaluation today patient's tools are appears to be doing in my pinion roughly about the same as were things that previous. Fortunately there does not appear to be signs of active infection at this time which is good news. With that being said he has not been experiencing any pain at this time which is good news there is also No fevers, chills, nausea, or vomiting noted at this time. 04/18/19 on evaluation today patient's wound actually appears to be doing fairly well today he did not even really have a lot of callous buildup. We have been debating on whether or not to reinitiate total contact cast. With that being said he seems to be doing fairly well this point with offloading in my pinion and again I think we need to come up with a way to get this healed that will also be sustainable for keeping it close. Again we were able to close it with the cast previous but again he has to be able to keep it such. For that reason we are working on trying to get him diabetic shoes he's waiting for the [place] to get in touch with the local company that has to measure him for these do not want to have a cast on at such a time as when they need to actually measure him. 04/25/19 on evaluation today patient appears to be doing well in regard to his plantar foot specifically great toe ulcer. He's been tolerating the dressing changes without complication. Fortunately there's no signs of active infection at this time. Overall I'm very pleased with how things appear currently. 05/02/19 on evaluation today patient appears to actually be doing better in regard to his plantar foot ulcer. He shown signs of good  improvement which is excellent news. She states he can walk better with the cast on the can with the offloading shoe that he had on previous. Fortunately there's no signs of active infection at this time. No fevers, chills, nausea, or vomiting noted at this time. 05/09/19 on evaluation today patient actually appears to be doing excellent. In fact he appears to be completely healed based on evaluation at this time. Fortunately there's no signs of infection and is having no discomfort. 06/08/19 this is a follow-up visit although the patient has been healed for almost a month but not quite. He tells me that in the past several days he noted some blood coming from his toe and he thought he should come in at this checked out. Fortunately he's having a pain. He also did get his custom orthotics shoes. With that being said he still appears to have developed this ulceration there is something he tells me that the orthotic specialist stated they could do to the toes to try to help out in this regard although he did not know exactly what it was we may need to check with [facility]. 06/12/19 on evaluation today patient actually appears to be doing great in regard to his foot ulcer. The wound on his toes shown signs of excellent improvement which is great news. With that being said he is not completely close yet the cast has done wonders for him and he is definitely headed in the correct direction. No fevers, chills, nausea, or vomiting noted at this time. 06/22/19 on evaluation today fortunately patient actually appears to be healing quite well and in  fact appears to be completely healed this is excellent news. Fortunately there's no signs of active infection he's been tolerating the total contact cast without complication. Overall I'm pleased with the progress that has been made. He also has his new custom shoes he brought was [name], [name] ([id number]) with him today. 07/04/19 on evaluation today  patient actually appears to be doing a little bit worse in regard his toe ulcer unfortunately this has yet again reopened. It has only been about a week and a half since I've last seen him I really did not expect this to reopen this quickly. Nonetheless I did discuss with him his normal routine in order to see what may be causing this. He has custom shoes. With that being said he apparently has not been using the custom shoes and even when he has used them he has not even put the insert in there which is what was custom molded to him in order to appropriately offload high-pressure point areas. In fact his exact question to me was whether or not he needed to actually put those in they just came loose in the box and he had never installed them. Nonetheless it also came to light the he's been walking around in his bedroom slippers at home he tells me he does not go barefoot but he really has not been wearing his custom offloading/diabetic shoes. Nonetheless I do believe that this is why he continually reopens as far as the wound is concerned. I discussed this with the patient in great detail today for yet again although I previously had this discussion with him in the past. 07/11/19-Patient returns at 1 week for his left great toe wound which is actually doing really well, he has been using the insert to offload the toe and is very pleased with it. This looks like this is working out really well for him 07/18/19 on evaluation today patient appears to be doing really about the same with regard to his ulcer on the right great toe. He's been tolerating the dressing changes without complication. Fortunately there's no signs of active infection at this time. With that being said I think that we're having a difficult time getting this to heal and I do believe that initiating treatment with the total contact cast to get this to close would be a good idea. The good news is he seems to be keeping this from  worsening so hopefully that means that if we get it closed he can prevent this from reopening in the future. At this point my suggestion was that we go ahead and initiate treatment at both sites utilizing collagen. We will then subsequently put a piece of silver out and it between the toes of the right foot in the webbing between the fourth and fifth toe in order to help keep things dry and allow this to hopefully close and we heal without complication. The patient is in agreement with plan. Subsequently I am hopeful that both wounds will heal quite quickly we have been very close now with the ankle and again the toe has reopened but fortunately doesn't appear to be to bad at this point. No antibiotics were prescribed today as they did not appear to be necessary. 07/25/19 on evaluation today patient actually appears to be doing excellent in regard to his great toe ulcer. In fact this really appears to be almost completely healed if indeed it is not in fact healed. With that being said there is still the  eighth small pinpoint area that could be open although I tend to doubt it. Nonetheless it is something that I would like to continue to watch for more week I do think it would be beneficial for [place] to go ahead and continue with the cast for one more week before deciding to discharge him especially in light of the issues he's had in the past. Specifically with regard to the wound reopening rather quickly. 08/01/2019 upon evaluation today patient actually appears to be doing well with regard to his great toe ulcer. He has been tolerating the dressing changes without complication. Fortunately we think appears to be completely healed at this time which is great news. I am very pleased with the progress up to this point. In fact he appears to be completely healed at this point. Readmission: 09/15/2019 patient presents today for reevaluation here in our clinic concerning issues that he is having with his left  great toe as he did previous. We have not actually seen him since August 01, 2019. He had been doing very well until he noticed when walking into the bathroom with just the socks on the other night that he had a little bit of bleeding from the toe location. Subsequently he decided to make an appointment to come in as he did not want this to get significantly worse. There does not appear to be any signs of active infection he tells me as long as he was wearing his shoes he seemed to be doing okay is walking around not necessarily barefoot but just with socks on that I think may be his main problem at this time. 09/22/2019 on evaluation today patient actually appears to be doing quite well with regard to his great toe ulcer. This is making signs of good improvement even in the short amount of time we have been taking care of his toe since he came back. Is just been 1 week. Nonetheless the improvement in the wound size is excellent he also really has no depth and no evidence of infection which is great news. 09/29/2019 on evaluation today patient appears to be doing well with regard to his toe ulcer. This is showing signs of improvement in fact it was questioning whether or not today this actually was healed. However there did appear to be some callus covering the surface of the wound was removed there did appear to be a small pinpoint opening still in the middle of the wound although this is minimal. 10/06/2019 on evaluation today patient appears to be doing excellent in regard to his toe ulcer which actually showed signs of complete Epithelization today. There is no evidence of active infection which is good news. Overall he has been tolerating the cast without complication but I think he is done with that as of now. [name], [name] ([id number]) Readmission: 11/28/2019 on evaluation today patient appears to be unfortunately here today again for another issue with his left great toe. He has been  having some significant problems intermittently over the past year or really that I been seeing him. Were able to heal him and then subsequently things will get worse yet again. With that being said the patient tells me at this time that he began to have issues with drainage noted 2 weeks ago. He has no pain but again he is never really had any discomfort. No fevers, chills, nausea, vomiting, or diarrhea. He has been using his offloading shoes he tells me. 12/05/19 on evaluation today patient actually appears to  be doing quite well with regard to the wound on his toe. He really hasn't been using the Pegasus offloading shoe for now. He tells me he actually forgot. With that being said there does not appear to be any signs of active infection at this time which is good news. No fevers, chills, nausea, or vomiting noted at this time. 12/12/2019 on evaluation today the patient appears to be doing quite well with regard to his toe ulcer. This does not seem to be showing any signs of worsening he has been using his custom shoes and that seems to be beneficial for him. Fortunately there is no sign of active infection at this point. No fevers, chills, nausea, vomiting, or diarrhea. Patient History Information obtained from Patient. Family History Diabetes - Mother, Heart Disease - Father, Stroke - Father, No family history of Cancer, Hereditary Spherocytosis, Hypertension, Kidney Disease, Lung Disease, Seizures, Thyroid Problems, Tuberculosis. Social History Never smoker, Marital Status - Widowed, Alcohol Use - Never, Drug Use - No History, Caffeine Use - Moderate. Medical History Eyes Denies history of Cataracts, Glaucoma, Optic Neuritis Ear/Nose/Mouth/Throat Denies history of Chronic sinus problems/congestion, Middle ear problems Hematologic/Lymphatic Denies history of Anemia, Hemophilia, Human Immunodeficiency Virus, Lymphedema Respiratory Denies history of Aspiration, Asthma, Chronic  Obstructive Pulmonary Disease (COPD), Pneumothorax, Sleep Apnea, Tuberculosis Cardiovascular Patient has history of Hypertension Denies history of Angina, Arrhythmia, Congestive Heart Failure, Coronary Artery Disease, Deep Vein Thrombosis, Hypotension, Myocardial Infarction, Peripheral Arterial Disease, Peripheral Venous Disease, Phlebitis, Vasculitis Gastrointestinal Denies history of Cirrhosis , Colitis, Crohn s, Hepatitis A, Hepatitis B, Hepatitis C Endocrine Patient has history of Type II Diabetes Denies history of Type I Diabetes Genitourinary Denies history of End Stage Renal Disease Immunological Denies history of Lupus Erythematosus, Raynaud s, Scleroderma Integumentary (Skin) Denies history of History of Burn, History of pressure wounds Musculoskeletal Denies history of Gout, Rheumatoid Arthritis, Osteoarthritis, Osteomyelitis Neurologic Denies history of Dementia, Neuropathy, Quadriplegia, Paraplegia, Seizure Disorder Oncologic [name], [name] ([id number]) Denies history of Received Chemotherapy, Received Radiation Review of Systems (ROS) Constitutional Symptoms (General Health) Denies complaints or symptoms of Fatigue, Fever, Chills, Marked Weight Change. Respiratory Denies complaints or symptoms of Chronic or frequent coughs, Shortness of Breath. Cardiovascular Denies complaints or symptoms of Chest pain, LE edema. Psychiatric Denies complaints or symptoms of Anxiety, Claustrophobia. Objective Constitutional Well-nourished and well-hydrated in no acute distress. Vitals Time Taken: 1:35 PM, Height: 72 in, Weight: 254 lbs, BMI: 34.4, Temperature: 98.1 F, Pulse: 92 bpm, Respiratory Rate: 18 breaths/min, Blood Pressure: 159/76 mmHg. Respiratory normal breathing without difficulty. Psychiatric this patient is able to make decisions and demonstrates good insight into disease process. Alert and Oriented x 3. pleasant and cooperative. General Notes: Upon  inspection today patient's wound did require some sharp debridement to clear away some of the slough and necrotic debris from the surface of the wound which he tolerated today without complication. Post debridement wound bed does appear to be doing much better which is great news Integumentary (Hair, Skin) Wound #1 status is Open. Original cause of wound was Gradually Appeared. The wound is located on the Left Toe Great. The wound measures 0.5cm length x 0.3cm width x 0.3cm depth; 0.118cm^2 area and 0.035cm^3 volume. There is Fat Layer (Subcutaneous Tissue) Exposed exposed. There is a medium amount of serosanguineous drainage noted. The wound margin is flat and intact. There is no granulation within the wound bed. There is a large (67-100%) amount of necrotic tissue within the wound bed including Eschar and Adherent Slough. Assessment Active  Problems ICD-10 Type 2 diabetes mellitus with foot ulcer Non-pressure chronic ulcer of other part of left foot with fat layer exposed [place], [name] ([id number]) Essential (primary) hypertension Type 2 diabetes mellitus with diabetic autonomic (poly)neuropathy Procedures Wound #1 Pre-procedure diagnosis of Wound #1 is a Diabetic Wound/Ulcer of the Lower Extremity located on the Left Toe Great .Severity of Tissue Pre Debridement is: Fat layer exposed. There was a Excisional Skin/Subcutaneous Tissue Debridement with a total area of 0.15 sq cm performed by STONE III, Doratha Mcswain E., PA-C. With the following instrument(s): Curette to remove Viable and Non-Viable tissue/material. Material removed includes Callus, Subcutaneous Tissue, and Slough after achieving pain control using Lidocaine 4% Topical Solution. No specimens were taken. A time out was conducted at 14:08, prior to the start of the procedure. A Minimum amount of bleeding was controlled with Pressure. The procedure was tolerated well with a pain level of 0 throughout and a pain level of 0  following the procedure. Post Debridement Measurements: 0.6cm length x 0.4cm width x 0.2cm depth; 0.038cm^3 volume. Character of Wound/Ulcer Post Debridement is improved. Severity of Tissue Post Debridement is: Fat layer exposed. Post procedure Diagnosis Wound #1: Same as Pre-Procedure Plan Wound Cleansing: Wound #1 Left Toe Great: May Shower, gently pat wound dry prior to applying new dressing. Anesthetic (add to Medication List): Wound #1 Left Toe Great: Topical Lidocaine 4% cream applied to wound bed prior to debridement (In Clinic Only). Primary Wound Dressing: Wound #1 Left Toe Great: Silver Alginate Secondary Dressing: Wound #1 Left Toe Great: Conform/Kerlix Foam Dressing Change Frequency: Wound #1 Left Toe Great: Change dressing every other day. Follow-up Appointments: Return Appointment in 1 week. Off-Loading: Wound #1 Left Toe Great: Open toe surgical shoe with peg assist. 1 I would recommend currently that we go ahead and initiate a continuation of the current wound care measures which includes the silver alginate as well as using foam to help to offload the toe ulcer. 2. I am also going to suggest at this point that he continue to use his custom shoes he feels like that does better for him than the surgical shoe with peg assist which he tells me he feels like he is going to fall wearing a lot of times. [name], [name] ([id number]) We will see patient back for reevaluation in 1 week here in the clinic. If anything worsens or changes patient will contact our office for additional recommendations. Electronic Signature(s) Signed: 12/13/2019 5:53:26 PM By: Worthy Keeler PA-C Entered By: Worthy Keeler on 12/13/2019 17:53:26 [place], [name] ([id number]) -------------------------------------------------------------------------------- ROS/PFSH Details Patient Name: [name] Date of Service: 12/12/2019 1:30 PM Medical Record Number:  [id number] Patient Account Number: [id number] Date of Birth/Sex: Jun 11, 1946 (73 y.o. M) Treating RN: Montey Hora Primary Care Provider: Frazier Richards Other Clinician: Referring Provider: Frazier Richards Treating Provider/Extender: Melburn Hake, Meshia Rau Weeks in Treatment: 2 Information Obtained From Patient Constitutional Symptoms (General Health) Complaints and Symptoms: Negative for: Fatigue; Fever; Chills; Marked Weight Change Respiratory Complaints and Symptoms: Negative for: Chronic or frequent coughs; Shortness of Breath Medical History: Negative for: Aspiration; Asthma; Chronic Obstructive Pulmonary Disease (COPD); Pneumothorax; Sleep Apnea; Tuberculosis Cardiovascular Complaints and Symptoms: Negative for: Chest pain; LE edema Medical History: Positive for: Hypertension Negative for: Angina; Arrhythmia; Congestive Heart Failure; Coronary Artery Disease; Deep Vein Thrombosis; Hypotension; Myocardial Infarction; Peripheral Arterial Disease; Peripheral Venous Disease; Phlebitis; Vasculitis Psychiatric Complaints and Symptoms: Negative for: Anxiety; Claustrophobia Eyes Medical History: Negative for: Cataracts; Glaucoma; Optic Neuritis Ear/Nose/Mouth/Throat Medical History: Negative  for: Chronic sinus problems/congestion; Middle ear problems Hematologic/Lymphatic Medical History: Negative for: Anemia; Hemophilia; Human Immunodeficiency Virus; Lymphedema Gastrointestinal [name], KITTNER ([id number]) Medical History: Negative for: Cirrhosis ; Colitis; Crohnos; Hepatitis A; Hepatitis B; Hepatitis C Endocrine Medical History: Positive for: Type II Diabetes Negative for: Type I Diabetes Time with diabetes: 1990 Treated with: Insulin Blood sugar tested every day: Yes Tested : Genitourinary Medical History: Negative for: End Stage Renal Disease Immunological Medical History: Negative for: Lupus Erythematosus; Raynaudos; Scleroderma Integumentary  (Skin) Medical History: Negative for: History of Burn; History of pressure wounds Musculoskeletal Medical History: Negative for: Gout; Rheumatoid Arthritis; Osteoarthritis; Osteomyelitis Neurologic Medical History: Negative for: Dementia; Neuropathy; Quadriplegia; Paraplegia; Seizure Disorder Oncologic Medical History: Negative for: Received Chemotherapy; Received Radiation Immunizations Pneumococcal Vaccine: Received Pneumococcal Vaccination: Yes Implantable Devices None Family and Social History Cancer: No; Diabetes: Yes - Mother; Heart Disease: Yes - Father; Hereditary Spherocytosis: No; Hypertension: No; Kidney Disease: No; Lung Disease: No; Seizures: No; Stroke: Yes - Father; Thyroid Problems: No; Tuberculosis: No; Never smoker; Marital Status - Widowed; Alcohol Use: Never; Drug Use: No History; Caffeine Use: Moderate; Financial Concerns: No; Food, Clothing or Shelter Needs: No; Support System Lacking: No; Transportation Concerns: No Physician Affirmation I have reviewed and agree with the above information. JARAE, KLEINTOP ([id number]) Electronic Signature(s) Signed: 12/12/2019 4:30:49 PM By: Montey Hora Signed: 12/13/2019 6:11:58 PM By: Worthy Keeler PA-C Entered By: Worthy Keeler on 12/12/2019 14:19:24 [place], [name] ([id number]) -------------------------------------------------------------------------------- SuperBill Details Patient Name: [name] Date of Service: 12/12/2019 Medical Record Number: [id number] Patient Account Number: [id number] Date of Birth/Sex: [birth year] (73 y.o. 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

## 2019-12-12 NOTE — Progress Notes (Addendum)
STEADMAN, MANGLICMOT ([id number]) Visit Report for 12/12/2019 Arrival Information Details Patient Name: JAYSHON, RINGENBERG. Date of Service: 12/12/2019 1:30 PM Medical Record Number: [id number] Patient Account Number: [id number] Date of Birth/Sex: 1946-02-26 (73 y.o. M) Treating RN: Montey Hora Primary Care Tyannah Sane: Frazier Richards Other Clinician: Referring Lareina Espino: Frazier Richards Treating Emmalise Huard/Extender: Melburn Hake, HOYT Weeks in Treatment: 2 Visit Information History Since Last Visit Added or deleted any medications: No Patient Arrived: Cane Any new allergies or adverse reactions: No Arrival Time: 13:36 Had a fall or experienced change in No Accompanied By: self activities of daily living that may affect Transfer Assistance: None risk of falls: Patient Identification Verified: Yes Signs or symptoms of abuse/neglect since last visito No Secondary Verification Process Completed: Yes Hospitalized since last visit: No Implantable device outside of the clinic excluding No cellular tissue based products placed in the center since last visit: Has Dressing in Place as Prescribed: Yes Pain Present Now: No Electronic Signature(s) Signed: 12/12/2019 3:29:24 PM By: Lorine Bears RCP, RRT, CHT Entered By: Lorine Bears on 12/12/2019 13:37:38 [place], [name] ([id number]) -------------------------------------------------------------------------------- Encounter Discharge Information Details Patient Name: [name]. Date of Service: 12/12/2019 1:30 PM Medical Record Number: [id number] Patient Account Number: [id number] Date of Birth/Sex: [birth year] (73 y.o. M) Treating RN: Montey Hora Primary Care Latandra Loureiro: Frazier Richards Other Clinician: Referring Liana Camerer: Frazier Richards Treating Santhosh Gulino/Extender: Melburn Hake, HOYT Weeks in Treatment: 2 Encounter Discharge Information Items Post Procedure Vitals Discharge  Condition: Stable Temperature (F): 98.1 Ambulatory Status: Cane Pulse (bpm): 92 Discharge Destination: Home Respiratory Rate (breaths/min): 16 Transportation: Private Auto Blood Pressure (mmHg): 159/76 Accompanied By: self Schedule Follow-up Appointment: Yes Clinical Summary of Care: Electronic Signature(s) Signed: 12/12/2019 4:30:49 PM By: Montey Hora Entered By: Montey Hora on 12/12/2019 14:14:26 [place], [name] ([id number]) -------------------------------------------------------------------------------- Lower Extremity Assessment Details Patient Name: [name]. Date of Service: 12/12/2019 1:30 PM Medical Record Number: [id number] Patient Account Number: [id number] Date of Birth/Sex: 01/10/1946 (73 y.o. M) Treating RN: Army Melia Primary Care Chanz Cahall: Frazier Richards Other Clinician: Referring Malkie Wille: Frazier Richards Treating Cosandra Plouffe/Extender: STONE III, HOYT Weeks in Treatment: 2 Edema Assessment Assessed: [Left: No] [Right: No] Edema: [Left: N] [Right: o] Vascular Assessment Pulses: Dorsalis Pedis Palpable: [Left:Yes] Electronic Signature(s) Signed: 12/12/2019 4:14:53 PM By: Army Melia Entered By: Army Melia on 12/12/2019 13:43:39 [place], [name] ([id number]) -------------------------------------------------------------------------------- Multi Wound Chart Details Patient Name: [name]. Date of Service: 12/12/2019 1:30 PM Medical Record Number: [id number] Patient Account Number: [id number] Date of Birth/Sex: Nov 12, 1946 (73 y.o. M) Treating RN: Montey Hora Primary Care Mechell Girgis: Frazier Richards Other Clinician: Referring Garnell Phenix: Frazier Richards Treating Jasiel Belisle/Extender: Melburn Hake, HOYT Weeks in Treatment: 2 Vital Signs Height(in): 72 Pulse(bpm): 92 Weight(lbs): 254 Blood Pressure(mmHg): 159/76 Body Mass Index(BMI): 34 Temperature(F): 98.1 Respiratory Rate 18 (breaths/min): Photos:  [N/A:N/A] Wound Location: Left Toe Great N/A N/A Wounding Event: Gradually Appeared N/A N/A Primary Etiology: Diabetic Wound/Ulcer of the N/A N/A Lower Extremity Comorbid History: Hypertension, Type II Diabetes N/A N/A Date Acquired: 11/14/2019 N/A N/A Weeks of Treatment: 2 N/A N/A Wound Status: Open N/A N/A Measurements L x W x D 0.5x0.3x0.3 N/A N/A (cm) Area (cm) : 0.118 N/A N/A Volume (cm) : 0.035 N/A N/A % Reduction in Area: -87.30% N/A N/A % Reduction in Volume: 7.90% N/A N/A Classification: Grade 1 N/A N/A Exudate Amount: Medium N/A N/A Exudate Type: Serosanguineous N/A N/A Exudate Color: red, brown N/A N/A Wound Margin: Flat and Intact N/A N/A Granulation Amount: None Present (0%) N/A N/A Necrotic  Amount: Large (67-100%) N/A N/A Necrotic Tissue: Eschar, Adherent Slough N/A N/A Exposed Structures: Fat Layer (Subcutaneous N/A N/A Tissue) Exposed: Yes Fascia: No Tendon: No Muscle: No Joint: No Bone: No JAKE, IRIGOYEN ([id number]) Epithelialization: None N/A N/A Treatment Notes Electronic Signature(s) Signed: 12/12/2019 4:30:49 PM By: Montey Hora Entered By: Montey Hora on 12/12/2019 14:07:56 [place], [name] ([id number]) -------------------------------------------------------------------------------- [place] Details Patient Name: [name]. Date of Service: 12/12/2019 1:30 PM Medical Record Number: [id number] Patient Account Number: [id number] Date of Birth/Sex: 1946-02-09 (73 y.o. M) Treating RN: Montey Hora Primary Care Bailea Beed: Frazier Richards Other Clinician: Referring Tage Feggins: Frazier Richards Treating Tramar Brueckner/Extender: Melburn Hake, HOYT Weeks in Treatment: 2 Active Inactive Abuse / Safety / Falls / Self Care Management Nursing Diagnoses: Potential for falls Goals: Patient will not experience any injury related to falls Date Initiated: 11/28/2019 Target Resolution Date: 03/09/2020 Goal  Status: Active Interventions: Assess fall risk on admission and as needed Notes: Necrotic Tissue Nursing Diagnoses: Impaired tissue integrity related to necrotic/devitalized tissue Goals: Necrotic/devitalized tissue will be minimized in the wound bed Date Initiated: 11/28/2019 Target Resolution Date: 03/09/2020 Goal Status: Active Interventions: Provide education on necrotic tissue and debridement process Notes: Nutrition Nursing Diagnoses: Potential for alteratiion in Nutrition/Potential for imbalanced nutrition Goals: Patient/caregiver agrees to and verbalizes understanding of need to use nutritional supplements and/or vitamins as prescribed Date Initiated: 11/28/2019 Target Resolution Date: 03/09/2020 Goal Status: Active Interventions: Assess patient nutrition upon admission and as needed per policy VRISHANK, NEPOMUCENO ([id number]) Notes: Orientation to the Wound Care Program Nursing Diagnoses: Knowledge deficit related to the wound healing center program Goals: Patient/caregiver will verbalize understanding of the [facility] Program Date Initiated: 11/28/2019 Target Resolution Date: 03/09/2020 Goal Status: Active Interventions: Provide education on orientation to the wound center Notes: Wound/Skin Impairment Nursing Diagnoses: Impaired tissue integrity Goals: Ulcer/skin breakdown will heal within 14 weeks Date Initiated: 11/28/2019 Target Resolution Date: 03/09/2020 Goal Status: Active Interventions: Assess patient/caregiver ability to obtain necessary supplies Assess patient/caregiver ability to perform ulcer/skin care regimen upon admission and as needed Assess ulceration(s) every visit Notes: Electronic Signature(s) Signed: 12/12/2019 4:30:49 PM By: Montey Hora Entered By: Montey Hora on 12/12/2019 14:07:50 [place], [name] ([id number]) -------------------------------------------------------------------------------- Pain Assessment  Details Patient Name: [name]. Date of Service: 12/12/2019 1:30 PM Medical Record Number: [id number] Patient Account Number: [id number] Date of Birth/Sex: 04-Mar-1946 (73 y.o. M) Treating RN: Montey Hora Primary Care Kimoni Pagliarulo: Frazier Richards Other Clinician: Referring Arzell Mcgeehan: Frazier Richards Treating Lauris Keepers/Extender: Melburn Hake, HOYT Weeks in Treatment: 2 Active Problems Location of Pain Severity and Description of Pain Patient Has Paino No Site Locations Pain Management and Medication Current Pain Management: Electronic Signature(s) Signed: 12/12/2019 3:29:24 PM By: Paulla Fore, RRT, CHT Signed: 12/12/2019 4:30:49 PM By: Montey Hora Entered By: Lorine Bears on 12/12/2019 13:37:45 [place], [name] ([id number]) -------------------------------------------------------------------------------- Patient/Caregiver Education Details Patient Name: [name] Date of Service: 12/12/2019 1:30 PM Medical Record Number: [id number] Patient Account Number: [id number] Date of Birth/Gender: [birth year] (73 y.o. M) Treating RN: Montey Hora Primary Care Physician: Frazier Richards Other Clinician: Referring Physician: Frazier Richards Treating Physician/Extender: Sharalyn Ink in Treatment: 2 Education Assessment Education Provided To: Patient Education Topics Provided Offloading: Handouts: Other: offloading toe Methods: Explain/Verbal Responses: State content correctly Electronic Signature(s) Signed: 12/12/2019 4:30:49 PM By: Montey Hora Entered By: Montey Hora on 12/12/2019 14:12:54 [place], [name] ([id number]) -------------------------------------------------------------------------------- Wound Assessment Details Patient Name: [name]. Date of Service: 12/12/2019 1:30 PM Medical Record Number:  [id number] Patient Account Number: [id number] Date of Birth/Sex:  05/25/1946 (73 y.o. M) Treating RN: Army Melia Primary Care Alaric Gladwin: Frazier Richards Other Clinician: Referring Eri Platten: Frazier Richards Treating [place] Bowlds/Extender: Melburn Hake, HOYT Weeks in Treatment: 2 Wound Status Wound Number: 1 Primary Etiology: Diabetic Wound/Ulcer of the Lower Extremity Wound Location: Left Toe Great Wound Status: Open Wounding Event: Gradually Appeared Comorbid Hypertension, Type II Diabetes Date Acquired: 11/14/2019 History: Weeks Of Treatment: 2 Clustered Wound: No Photos Wound Measurements Length: (cm) 0.5 % Reduction i Width: (cm) 0.3 % Reduction i Depth: (cm) 0.3 Epithelializa Area: (cm) 0.118 Volume: (cm) 0.035 n Area: -87.3% n Volume: 7.9% tion: None Wound Description Classification: Grade 1 Foul Odor Aft Wound Margin: Flat and Intact Slough/Fibrin Exudate Amount: Medium Exudate Type: Serosanguineous Exudate Color: red, brown er Cleansing: No o Yes Wound Bed Granulation Amount: None Present (0%) Exposed Structure Necrotic Amount: Large (67-100%) Fascia Exposed: No Necrotic Quality: Eschar, Adherent Slough Fat Layer (Subcutaneous Tissue) Exposed: Yes Tendon Exposed: No Muscle Exposed: No Joint Exposed: No Bone Exposed: No Treatment Notes TYREESE, SIDDOWAY ([id number]) Wound #1 (Left Toe Great) Notes silvercel, foam and conform Electronic Signature(s) Signed: 12/12/2019 4:14:53 PM By: Army Melia Entered By: Army Melia on 12/12/2019 13:43:29 [place], [name] ([id number]) -------------------------------------------------------------------------------- Vitals Details Patient Name: [name] Date of Service: 12/12/2019 1:30 PM Medical Record Number: [id number] Patient Account Number: [id number] Date of Birth/Sex: March 25, 1946 (73 y.o. M) Treating RN: Montey Hora Primary Care Lisvet Rasheed: Frazier Richards Other Clinician: Referring Izadora Roehr: Frazier Richards Treating Devinne Epstein/Extender:  Melburn Hake, HOYT Weeks in Treatment: 2 Vital Signs Time Taken: 13:35 Temperature (F): 98.1 Height (in): 72 Pulse (bpm): 92 Weight (lbs): 254 Respiratory Rate (breaths/min): 18 Body Mass Index (BMI): 34.4 Blood Pressure (mmHg): 159/76 Reference Range: 80 - 120 mg / dl Electronic Signature(s) Signed: 12/12/2019 3:29:24 PM By: Lorine Bears RCP, RRT, CHT Entered By: Lorine Bears on 12/12/2019 13:38:10

## 2019-12-19 ENCOUNTER — Encounter: Payer: Medicare Other | Admitting: Physician Assistant

## 2019-12-19 ENCOUNTER — Other Ambulatory Visit: Payer: Self-pay

## 2019-12-19 DIAGNOSIS — E11621 Type 2 diabetes mellitus with foot ulcer: Secondary | ICD-10-CM | POA: Diagnosis not present

## 2019-12-19 NOTE — Progress Notes (Signed)
[name], [name] ([id number]) Visit Report for 12/19/2019 Chief Complaint Document Details Patient Name: [name], [name]. Date of Service: 12/19/2019 12:45 PM Medical Record Number: [id number] Patient Account Number: [id number] Date of Birth/Sex: September 20, 1946 (73 y.o. M) Treating RN: Montey Hora Primary Care Provider: Frazier Richards Other Clinician: Referring Provider: Frazier Richards Treating Provider/Extender: Melburn Hake, Kenzly Rogoff Weeks in Treatment: 3 Information Obtained from: Patient Chief Complaint Left great toe ulcer Electronic Signature(s) Signed: 12/19/2019 1:05:53 PM By: Worthy Keeler PA-C Entered By: Worthy Keeler on 12/19/2019 13:05:53 [name], [name] ([id number]) -------------------------------------------------------------------------------- HPI Details Patient Name: [name] Date of Service: 12/19/2019 12:45 PM Medical Record Number: [id number] Patient Account Number: [id number] Date of Birth/Sex: 1946-09-08 (73 y.o. M) Treating RN: Montey Hora Primary Care Provider: Frazier Richards Other Clinician: Referring Provider: Frazier Richards Treating Provider/Extender: Melburn Hake, Merced Brougham Weeks in Treatment: 3 History of Present Illness HPI Description: 12/12/18 on evaluation today patient presents as a referral from his endocrinologist regarding issues that he has been having with his left great toe. Subsequently he has been seeing Dr. Cleda Mccreedy and Dr. Cleda Mccreedy has been the breeding the wound and in fact this was debrided this morning. The patient had an appointment there prior to coming here. Subsequently he states that even though he's been going for regular debridement after office things really do not seem to be showing signs of improvement unfortunately. He states that he is having some discomfort but nothing too significant. No fevers, chills, nausea, or vomiting noted at this time. The patient does have a history of  hypertension, neuropathy, diabetes mellitus type II, and a history of stroke. Currently he's been using bacitracin on the toe and utilizing a Band-Aid. He tells me he's had this wound for two years. He cannot remember the last time he had an x-ray. 12/19/18 on evaluation today patient appears to be doing well in regard to his plantar toe ulcer. He's been tolerating the dressing changes without complication. Fortunately there does not appear to be signs of infection at this time which is good news. No fever chills noted. 01/19/19 has been one month since I last saw the patient as he was out of town. With that being said on evaluation today it does appear that he is going to require sharp debridement and I do believe that the total contact cast would benefit him as far as being applied at this time. He is in agreement that plan. 01/23/19 on evaluation today patient appears to be doing better in regard to his left great toe ulcer. He has been shown signs of improvement week by week and although this is slow it does seem to be doing fairly well which is good news. Overall very pleased in this regard. 01/31/19 on evaluation today patient presents for follow-up concerning his great toe ulcer. He was actually supposed to be here yesterday and he did come however he ended up having to leave due to his blood sugar dropping he states he just did not eat enough lunch. Fortunately seems to be doing much better today which is excellent news. Fortunately there's no evidence of infection at this time which is also good news. No fevers, chills, nausea, or vomiting noted at this time. Overall I feel like he is making excellent progress. 02/06/19 on evaluation today patient actually appears to be doing very well in regard to his plantar toe ulcer. He's been tolerating the dressing changes without complication. Fortunately there is no sign of infection at this time. Overall been  very pleased with how things seem to be  progressing. No fevers, chills, nausea, or vomiting noted at this time. 02/13/19 on evaluation today patient actually appears to be doing excellent in regard to his toe ulcer which is almost completely close. Overall very pleased with that. There does not appear to be any signs of infection which is good news. Upon close inspection it appears that he has just a very slight opening still noted at the central portion of the toe although this is minimal. 02/16/19 on evaluation today patient is seen for early follow-up due to the fact that he tells me while at home he actually got up to go answer the door without putting on the walking boot part of his cast and subsequently damaged the total contact cast. It therefore comes in to have this removed and potentially replaced. Fortunately other than it given him a little bit of pain its far as pushing in on some areas there doesn't appear to be any injury once the cast was removed. 03/13/19 on evaluation today patient unfortunately presents for follow-up concerning his great toe ulcer on the left. He was discharged on 02/16/19 with the wound healed at that point. He tells me this remain so for several weeks or least a couple weeks before reopening. Fortunately there does not appear to be any signs of infection at this time. Nonetheless for the past week at least he's had the wound reopened and states it is been trying to keep pressure off of this but he has not been using the offloading shoe we previously gave him. Fortunately there's no evidence of infection at this time. No fevers, chills, nausea, or vomiting noted at this time. [name], [name] ([id number]) 03/17/19 on evaluation today patient actually appears to be doing rather well in regard to his toe also. Fact this appears to be significantly smaller this week even compared to what I saw last week on evaluation. He seems to have done very well for the offloading shoe and overall I'm very pleased  with the progress that is made. It may be that he doesn't even need the cast to get this to heal if he offloads this appropriately. 03/24/19 on evaluation today patient actually appears to be doing well in regard to his plantar foot ulcer. He's been tolerating the dressing changes without complication the collagen does seem to be beneficial for him. With that being said he is gonna require some miles sharp debridement today to clear away some of the callous's around the edge of the wound as well as the minimal Sl[place] noted on the surface of the wound. 03/31/19 on evaluation today patient appears to be doing well in regard to his plantar great toe ulcer. He has been tolerating the dressing changes without complication which is good news. Fortunately there does not appear to be any signs of active infection at this time. Overall very pleased with how things seem to be progressing. 04/11/19 on evaluation today patient's tools are appears to be doing in my pinion roughly about the same as were things that previous. Fortunately there does not appear to be signs of active infection at this time which is good news. With that being said he has not been experiencing any pain at this time which is good news there is also No fevers, chills, nausea, or vomiting noted at this time. 04/18/19 on evaluation today patient's wound actually appears to be doing fairly well today he did not even really have a lot of  callous buildup. We have been debating on whether or not to reinitiate total contact cast. With that being said he seems to be doing fairly well this point with offloading in my pinion and again I think we need to come up with a way to get this healed that will also be sustainable for keeping it close. Again we were able to close it with the cast previous but again he has to be able to keep it such. For that reason we are working on trying to get him diabetic shoes he's waiting for the [place] to get in touch  with the local company that has to measure him for these do not want to have a cast on at such a time as when they need to actually measure him. 04/25/19 on evaluation today patient appears to be doing well in regard to his plantar foot specifically great toe ulcer. He's been tolerating the dressing changes without complication. Fortunately there's no signs of active infection at this time. Overall I'm very pleased with how things appear currently. 05/02/19 on evaluation today patient appears to actually be doing better in regard to his plantar foot ulcer. He shown signs of good improvement which is excellent news. She states he can walk better with the cast on the can with the offloading shoe that he had on previous. Fortunately there's no signs of active infection at this time. No fevers, chills, nausea, or vomiting noted at this time. 05/09/19 on evaluation today patient actually appears to be doing excellent. In fact he appears to be completely healed based on evaluation at this time. Fortunately there's no signs of infection and is having no discomfort. 06/08/19 this is a follow-up visit although the patient has been healed for almost a month but not quite. He tells me that in the past several days he noted some blood coming from his toe and he thought he should come in at this checked out. Fortunately he's having a pain. He also did get his custom orthotics shoes. With that being said he still appears to have developed this ulceration there is something he tells me that the orthotic specialist stated they could do to the toes to try to help out in this regard although he did not know exactly what it was we may need to check with [facility]. 06/12/19 on evaluation today patient actually appears to be doing great in regard to his foot ulcer. The wound on his toes shown signs of excellent improvement which is great news. With that being said he is not completely close yet the cast has done wonders  for him and he is definitely headed in the correct direction. No fevers, chills, nausea, or vomiting noted at this time. 06/22/19 on evaluation today fortunately patient actually appears to be healing quite well and in fact appears to be completely healed this is excellent news. Fortunately there's no signs of active infection he's been tolerating the total contact cast without complication. Overall I'm pleased with the progress that has been made. He also has his new custom shoes he brought was with him today. 07/04/19 on evaluation today patient actually appears to be doing a little bit worse in regard his toe ulcer unfortunately this has yet again reopened. It has only been about a week and a half since I've last seen him I really did not expect this to reopen this quickly. Nonetheless I did discuss with him his normal routine in order to see what may be causing this.  He has custom shoes. With that being said he apparently has not been using the custom shoes and even when he has used them he has not GUSTAVUS, BASALDUA. ([id number]) even put the insert in there which is what was custom molded to him in order to appropriately offload high-pressure point areas. In fact his exact question to me was whether or not he needed to actually put those in they just came loose in the box and he had never installed them. Nonetheless it also came to light the he's been walking around in his bedroom slippers at home he tells me he does not go barefoot but he really has not been wearing his custom offloading/diabetic shoes. Nonetheless I do believe that this is why he continually reopens as far as the wound is concerned. I discussed this with the patient in great detail today for yet again although I previously had this discussion with him in the past. 07/11/19-Patient returns at 1 week for his left great toe wound which is actually doing really well, he has been using the insert to offload the toe and is very  pleased with it. This looks like this is working out really well for him 07/18/19 on evaluation today patient appears to be doing really about the same with regard to his ulcer on the right great toe. He's been tolerating the dressing changes without complication. Fortunately there's no signs of active infection at this time. With that being said I think that we're having a difficult time getting this to heal and I do believe that initiating treatment with the total contact cast to get this to close would be a good idea. The good news is he seems to be keeping this from worsening so hopefully that means that if we get it closed he can prevent this from reopening in the future. At this point my suggestion was that we go ahead and initiate treatment at both sites utilizing collagen. We will then subsequently put a piece of silver out and it between the toes of the right foot in the webbing between the fourth and fifth toe in order to help keep things dry and allow this to hopefully close and we heal without complication. The patient is in agreement with plan. Subsequently I am hopeful that both wounds will heal quite quickly we have been very close now with the ankle and again the toe has reopened but fortunately doesn't appear to be to bad at this point. No antibiotics were prescribed today as they did not appear to be necessary. 07/25/19 on evaluation today patient actually appears to be doing excellent in regard to his great toe ulcer. In fact this really appears to be almost completely healed if indeed it is not in fact healed. With that being said there is still the eighth small pinpoint area that could be open although I tend to doubt it. Nonetheless it is something that I would like to continue to watch for more week I do think it would be beneficial for [place] to go ahead and continue with the cast for one more week before deciding to discharge him especially in light of the issues he's had in the  past. Specifically with regard to the wound reopening rather quickly. 08/01/2019 upon evaluation today patient actually appears to be doing well with regard to his great toe ulcer. He has been tolerating the dressing changes without complication. Fortunately we think appears to be completely healed at this time which is great news.  I am very pleased with the progress up to this point. In fact he appears to be completely healed at this point. Readmission: 09/15/2019 patient presents today for reevaluation here in our clinic concerning issues that he is having with his left great toe as he did previous. We have not actually seen him since August 01, 2019. He had been doing very well until he noticed when walking into the bathroom with just the socks on the other night that he had a little bit of bleeding from the toe location. Subsequently he decided to make an appointment to come in as he did not want this to get significantly worse. There does not appear to be any signs of active infection he tells me as long as he was wearing his shoes he seemed to be doing okay is walking around not necessarily barefoot but just with socks on that I think may be his main problem at this time. 09/22/2019 on evaluation today patient actually appears to be doing quite well with regard to his great toe ulcer. This is making signs of good improvement even in the short amount of time we have been taking care of his toe since he came back. Is just been 1 week. Nonetheless the improvement in the wound size is excellent he also really has no depth and no evidence of infection which is great news. 09/29/2019 on evaluation today patient appears to be doing well with regard to his toe ulcer. This is showing signs of improvement in fact it was questioning whether or not today this actually was healed. However there did appear to be some callus covering the surface of the wound was removed there did appear to be a small pinpoint  opening still in the middle of the wound although this is minimal. 10/06/2019 on evaluation today patient appears to be doing excellent in regard to his toe ulcer which actually showed signs of complete Epithelization today. There is no evidence of active infection which is good news. Overall he has been tolerating the cast without complication but I think he is done with that as of now. Readmission: 11/28/2019 on evaluation today patient appears to be unfortunately here today again for another issue with his left great toe. He has been having some significant problems intermittently over the past year or really that I been seeing him. Were able to heal him and then subsequently things will get worse yet again. With that being said the patient tells me at this time that he [name], [name]. ([id number]) began to have issues with drainage noted 2 weeks ago. He has no pain but again he is never really had any discomfort. No fevers, chills, nausea, vomiting, or diarrhea. He has been using his offloading shoes he tells me. 12/05/19 on evaluation today patient actually appears to be doing quite well with regard to the wound on his toe. He really hasn't been using the Pegasus offloading shoe for now. He tells me he actually forgot. With that being said there does not appear to be any signs of active infection at this time which is good news. No fevers, chills, nausea, or vomiting noted at this time. 12/12/2019 on evaluation today the patient appears to be doing quite well with regard to his toe ulcer. This does not seem to be showing any signs of worsening he has been using his custom shoes and that seems to be beneficial for him. Fortunately there is no sign of active infection at this point. No fevers,  chills, nausea, vomiting, or diarrhea. 12/19/2019 on evaluation today patient appears to be doing well with regard to his toe ulcer. He has been tolerating dressing changes without complication he is  wearing his offloading shoes and I do feel like they are helping at this point. Fortunately there is no signs of active infection at this time. Electronic Signature(s) Signed: 12/19/2019 1:35:03 PM By: Worthy Keeler PA-C Entered By: Worthy Keeler on 12/19/2019 13:35:02 [place], [name] ([id number]) -------------------------------------------------------------------------------- Physical Exam Details Patient Name: [name] Date of Service: 12/19/2019 12:45 PM Medical Record Number: [id number] Patient Account Number: [id number] Date of Birth/Sex: 11-21-1946 (73 y.o. M) Treating RN: Montey Hora Primary Care Provider: Frazier Richards Other Clinician: Referring Provider: Frazier Richards Treating Provider/Extender: STONE III, Rebel Laughridge Weeks in Treatment: 3 Constitutional Well-nourished and well-hydrated in no acute distress. Respiratory normal breathing without difficulty. clear to auscultation bilaterally. Cardiovascular regular rate and rhythm with normal S1, S2. Psychiatric this patient is able to make decisions and demonstrates good insight into disease process. Alert and Oriented x 3. pleasant and cooperative. Notes Patient's wound bed currently appears to be doing well no need for sharp debridement was noted today which is good news. There really was not any significant callus buildup either which is also good news. Electronic Signature(s) Signed: 12/19/2019 1:36:01 PM By: Worthy Keeler PA-C Entered By: Worthy Keeler on 12/19/2019 13:36:00 [place], [name] ([id number]) -------------------------------------------------------------------------------- Physician Orders Details Patient Name: [name] Date of Service: 12/19/2019 12:45 PM Medical Record Number: [id number] Patient Account Number: [id number] Date of Birth/Sex: [birth year] (73 y.o. M) Treating RN: Montey Hora Primary Care Provider: Frazier Richards Other  Clinician: Referring Provider: Frazier Richards Treating Provider/Extender: Melburn Hake, Evaline Waltman Weeks in Treatment: 3 Verbal / Phone Orders: No Diagnosis Coding ICD-10 Coding Code Description E11.621 Type 2 diabetes mellitus with foot ulcer L97.522 Non-pressure chronic ulcer of other part of left foot with fat layer exposed I10 Essential (primary) hypertension E11.43 Type 2 diabetes mellitus with diabetic autonomic (poly)neuropathy Wound Cleansing Wound #1 Left Toe Great o May Shower, gently pat wound dry prior to applying new dressing. Anesthetic (add to Medication List) Wound #1 Left Toe Great o Topical Lidocaine 4% cream applied to wound bed prior to debridement (In Clinic Only). Primary Wound Dressing Wound #1 Left Toe Great o Silver Collagen Secondary Dressing Wound #1 Left Toe Great o Conform/Kerlix o Foam Dressing Change Frequency Wound #1 Left Toe Great o Change dressing every other day. Follow-up Appointments o Return Appointment in 1 week. Off-Loading Wound #1 Left Toe Great o Open toe surgical shoe with peg assist. Electronic Signature(s) Unsigned Entered By: Montey Hora on 12/19/2019 13:11:11 Signature(s): Date(s): [name] ([id number]) [place], [name] ([id number]) -------------------------------------------------------------------------------- Problem List Details Patient Name: [name]. Date of Service: 12/19/2019 12:45 PM Medical Record Number: [id number] Patient Account Number: [id number] Date of Birth/Sex: August 14, 1946 (73 y.o. M) Treating RN: Montey Hora Primary Care Provider: Frazier Richards Other Clinician: Referring Provider: Frazier Richards Treating Provider/Extender: Melburn Hake, Ellerie Arenz Weeks in Treatment: 3 Active Problems ICD-10 Evaluated Encounter Code Description Active Date Today Diagnosis E11.621 Type 2 diabetes mellitus with foot ulcer 11/28/2019 No Yes L97.522 Non-pressure  chronic ulcer of other part of left foot with fat 11/28/2019 No Yes layer exposed I10 Essential (primary) hypertension 11/28/2019 No Yes E11.43 Type 2 diabetes mellitus with diabetic autonomic (poly) 11/28/2019 No Yes neuropathy Inactive Problems Resolved Problems Electronic Signature(s) Signed: 12/19/2019 1:04:59 PM By: Worthy Keeler PA-C Entered By: Worthy Keeler  on 12/19/2019 13:04:59 ADEMIR, BERENGUER ([id number]) -------------------------------------------------------------------------------- Progress Note Details Patient Name: AMEIR, GAARDER. Date of Service: 12/19/2019 12:45 PM Medical Record Number: [id number] Patient Account Number: [id number] Date of Birth/Sex: 12/07/1946 (73 y.o. M) Treating RN: Montey Hora Primary Care Provider: Frazier Richards Other Clinician: Referring Provider: Frazier Richards Treating Provider/Extender: Melburn Hake, Nickalas Mccarrick Weeks in Treatment: 3 Subjective Chief Complaint Information obtained from Patient Left great toe ulcer History of Present Illness (HPI) 12/12/18 on evaluation today patient presents as a referral from his endocrinologist regarding issues that he has been having with his left great toe. Subsequently he has been seeing Dr. Cleda Mccreedy and Dr. Cleda Mccreedy has been the breeding the wound and in fact this was debrided this morning. The patient had an appointment there prior to coming here. Subsequently he states that even though he's been going for regular debridement after office things really do not seem to be showing signs of improvement unfortunately. He states that he is having some discomfort but nothing too significant. No fevers, chills, nausea, or vomiting noted at this time. The patient does have a history of hypertension, neuropathy, diabetes mellitus type II, and a history of stroke. Currently he's been using bacitracin on the toe and utilizing a Band-Aid. He tells me he's had this wound for two years. He cannot  remember the last time he had an x-ray. 12/19/18 on evaluation today patient appears to be doing well in regard to his plantar toe ulcer. He's been tolerating the dressing changes without complication. Fortunately there does not appear to be signs of infection at this time which is good news. No fever chills noted. 01/19/19 has been one month since I last saw the patient as he was out of town. With that being said on evaluation today it does appear that he is going to require sharp debridement and I do believe that the total contact cast would benefit him as far as being applied at this time. He is in agreement that plan. 01/23/19 on evaluation today patient appears to be doing better in regard to his left great toe ulcer. He has been shown signs of improvement week by week and although this is slow it does seem to be doing fairly well which is good news. Overall very pleased in this regard. 01/31/19 on evaluation today patient presents for follow-up concerning his great toe ulcer. He was actually supposed to be here yesterday and he did come however he ended up having to leave due to his blood sugar dropping he states he just did not eat enough lunch. Fortunately seems to be doing much better today which is excellent news. Fortunately there's no evidence of infection at this time which is also good news. No fevers, chills, nausea, or vomiting noted at this time. Overall I feel like he is making excellent progress. 02/06/19 on evaluation today patient actually appears to be doing very well in regard to his plantar toe ulcer. He's been tolerating the dressing changes without complication. Fortunately there is no sign of infection at this time. Overall been very pleased with how things seem to be progressing. No fevers, chills, nausea, or vomiting noted at this time. 02/13/19 on evaluation today patient actually appears to be doing excellent in regard to his toe ulcer which is almost completely close.  Overall very pleased with that. There does not appear to be any signs of infection which is good news. Upon close inspection it appears that he has just a very slight opening still noted at  the central portion of the toe although this is minimal. 02/16/19 on evaluation today patient is seen for early follow-up due to the fact that he tells me while at home he actually got up to go answer the door without putting on the walking boot part of his cast and subsequently damaged the total contact cast. It therefore comes in to have this removed and potentially replaced. Fortunately other than it given him a little bit of pain its far as pushing in on some areas there doesn't appear to be any injury once the cast was removed. HIGINIO, LENE ([id number]) 03/13/19 on evaluation today patient unfortunately presents for follow-up concerning his great toe ulcer on the left. He was discharged on 02/16/19 with the wound healed at that point. He tells me this remain so for several weeks or least a couple weeks before reopening. Fortunately there does not appear to be any signs of infection at this time. Nonetheless for the past week at least he's had the wound reopened and states it is been trying to keep pressure off of this but he has not been using the offloading shoe we previously gave him. Fortunately there's no evidence of infection at this time. No fevers, chills, nausea, or vomiting noted at this time. 03/17/19 on evaluation today patient actually appears to be doing rather well in regard to his toe also. Fact this appears to be significantly smaller this week even compared to what I saw last week on evaluation. He seems to have done very well for the offloading shoe and overall I'm very pleased with the progress that is made. It may be that he doesn't even need the cast to get this to heal if he offloads this appropriately. 03/24/19 on evaluation today patient actually appears to be doing well in  regard to his plantar foot ulcer. He's been tolerating the dressing changes without complication the collagen does seem to be beneficial for him. With that being said he is gonna require some miles sharp debridement today to clear away some of the callous's around the edge of the wound as well as the minimal Slough noted on the surface of the wound. 03/31/19 on evaluation today patient appears to be doing well in regard to his plantar great toe ulcer. He has been tolerating the dressing changes without complication which is good news. Fortunately there does not appear to be any signs of active infection at this time. Overall very pleased with how things seem to be progressing. 04/11/19 on evaluation today patient's tools are appears to be doing in my pinion roughly about the same as were things that previous. Fortunately there does not appear to be signs of active infection at this time which is good news. With that being said he has not been experiencing any pain at this time which is good news there is also No fevers, chills, nausea, or vomiting noted at this time. 04/18/19 on evaluation today patient's wound actually appears to be doing fairly well today he did not even really have a lot of callous buildup. We have been debating on whether or not to reinitiate total contact cast. With that being said he seems to be doing fairly well this point with offloading in my pinion and again I think we need to come up with a way to get this healed that will also be sustainable for keeping it close. Again we were able to close it with the cast previous but again he has to be able to  keep it such. For that reason we are working on trying to get him diabetic shoes he's waiting for the [place] to get in touch with the local company that has to measure him for these do not want to have a cast on at such a time as when they need to actually measure him. 04/25/19 on evaluation today patient appears to be doing well in  regard to his plantar foot specifically great toe ulcer. He's been tolerating the dressing changes without complication. Fortunately there's no signs of active infection at this time. Overall I'm very pleased with how things appear currently. 05/02/19 on evaluation today patient appears to actually be doing better in regard to his plantar foot ulcer. He shown signs of good improvement which is excellent news. She states he can walk better with the cast on the can with the offloading shoe that he had on previous. Fortunately there's no signs of active infection at this time. No fevers, chills, nausea, or vomiting noted at this time. 05/09/19 on evaluation today patient actually appears to be doing excellent. In fact he appears to be completely healed based on evaluation at this time. Fortunately there's no signs of infection and is having no discomfort. 06/08/19 this is a follow-up visit although the patient has been healed for almost a month but not quite. He tells me that in the past several days he noted some blood coming from his toe and he thought he should come in at this checked out. Fortunately he's having a pain. He also did get his custom orthotics shoes. With that being said he still appears to have developed this ulceration there is something he tells me that the orthotic specialist stated they could do to the toes to try to help out in this regard although he did not know exactly what it was we may need to check with [facility]. 06/12/19 on evaluation today patient actually appears to be doing great in regard to his foot ulcer. The wound on his toes shown signs of excellent improvement which is great news. With that being said he is not completely close yet the cast has done wonders for him and he is definitely headed in the correct direction. No fevers, chills, nausea, or vomiting noted at this time. 06/22/19 on evaluation today fortunately patient actually appears to be healing quite well  and in fact appears to be completely healed this is excellent news. Fortunately there's no signs of active infection he's been tolerating the total contact cast without complication. Overall I'm pleased with the progress that has been made. He also has his new custom shoes he brought was MANTON, LATHER ([id number]) with him today. 07/04/19 on evaluation today patient actually appears to be doing a little bit worse in regard his toe ulcer unfortunately this has yet again reopened. It has only been about a week and a half since I've last seen him I really did not expect this to reopen this quickly. Nonetheless I did discuss with him his normal routine in order to see what may be causing this. He has custom shoes. With that being said he apparently has not been using the custom shoes and even when he has used them he has not even put the insert in there which is what was custom molded to him in order to appropriately offload high-pressure point areas. In fact his exact question to me was whether or not he needed to actually put those in they just came loose in the  box and he had never installed them. Nonetheless it also came to light the he's been walking around in his bedroom slippers at home he tells me he does not go barefoot but he really has not been wearing his custom offloading/diabetic shoes. Nonetheless I do believe that this is why he continually reopens as far as the wound is concerned. I discussed this with the patient in great detail today for yet again although I previously had this discussion with him in the past. 07/11/19-Patient returns at 1 week for his left great toe wound which is actually doing really well, he has been using the insert to offload the toe and is very pleased with it. This looks like this is working out really well for him 07/18/19 on evaluation today patient appears to be doing really about the same with regard to his ulcer on the right great toe. He's been  tolerating the dressing changes without complication. Fortunately there's no signs of active infection at this time. With that being said I think that we're having a difficult time getting this to heal and I do believe that initiating treatment with the total contact cast to get this to close would be a good idea. The good news is he seems to be keeping this from worsening so hopefully that means that if we get it closed he can prevent this from reopening in the future. At this point my suggestion was that we go ahead and initiate treatment at both sites utilizing collagen. We will then subsequently put a piece of silver out and it between the toes of the right foot in the webbing between the fourth and fifth toe in order to help keep things dry and allow this to hopefully close and we heal without complication. The patient is in agreement with plan. Subsequently I am hopeful that both wounds will heal quite quickly we have been very close now with the ankle and again the toe has reopened but fortunately doesn't appear to be to bad at this point. No antibiotics were prescribed today as they did not appear to be necessary. 07/25/19 on evaluation today patient actually appears to be doing excellent in regard to his great toe ulcer. In fact this really appears to be almost completely healed if indeed it is not in fact healed. With that being said there is still the eighth small pinpoint area that could be open although I tend to doubt it. Nonetheless it is something that I would like to continue to watch for more week I do think it would be beneficial for [place] to go ahead and continue with the cast for one more week before deciding to discharge him especially in light of the issues he's had in the past. Specifically with regard to the wound reopening rather quickly. 08/01/2019 upon evaluation today patient actually appears to be doing well with regard to his great toe ulcer. He has been tolerating the  dressing changes without complication. Fortunately we think appears to be completely healed at this time which is great news. I am very pleased with the progress up to this point. In fact he appears to be completely healed at this point. Readmission: 09/15/2019 patient presents today for reevaluation here in our clinic concerning issues that he is having with his left great toe as he did previous. We have not actually seen him since August 01, 2019. He had been doing very well until he noticed when walking into the bathroom with just the socks on  the other night that he had a little bit of bleeding from the toe location. Subsequently he decided to make an appointment to come in as he did not want this to get significantly worse. There does not appear to be any signs of active infection he tells me as long as he was wearing his shoes he seemed to be doing okay is walking around not necessarily barefoot but just with socks on that I think may be his main problem at this time. 09/22/2019 on evaluation today patient actually appears to be doing quite well with regard to his great toe ulcer. This is making signs of good improvement even in the short amount of time we have been taking care of his toe since he came back. Is just been 1 week. Nonetheless the improvement in the wound size is excellent he also really has no depth and no evidence of infection which is great news. 09/29/2019 on evaluation today patient appears to be doing well with regard to his toe ulcer. This is showing signs of improvement in fact it was questioning whether or not today this actually was healed. However there did appear to be some callus covering the surface of the wound was removed there did appear to be a small pinpoint opening still in the middle of the wound although this is minimal. 10/06/2019 on evaluation today patient appears to be doing excellent in regard to his toe ulcer which actually showed signs of complete  Epithelization today. There is no evidence of active infection which is good news. Overall he has been tolerating the cast without complication but I think he is done with that as of now. ELWAY, MCCULLUM ([id number]) Readmission: 11/28/2019 on evaluation today patient appears to be unfortunately here today again for another issue with his left great toe. He has been having some significant problems intermittently over the past year or really that I been seeing him. Were able to heal him and then subsequently things will get worse yet again. With that being said the patient tells me at this time that he began to have issues with drainage noted 2 weeks ago. He has no pain but again he is never really had any discomfort. No fevers, chills, nausea, vomiting, or diarrhea. He has been using his offloading shoes he tells me. 12/05/19 on evaluation today patient actually appears to be doing quite well with regard to the wound on his toe. He really hasn't been using the Pegasus offloading shoe for now. He tells me he actually forgot. With that being said there does not appear to be any signs of active infection at this time which is good news. No fevers, chills, nausea, or vomiting noted at this time. 12/12/2019 on evaluation today the patient appears to be doing quite well with regard to his toe ulcer. This does not seem to be showing any signs of worsening he has been using his custom shoes and that seems to be beneficial for him. Fortunately there is no sign of active infection at this point. No fevers, chills, nausea, vomiting, or diarrhea. 12/19/2019 on evaluation today patient appears to be doing well with regard to his toe ulcer. He has been tolerating dressing changes without complication he is wearing his offloading shoes and I do feel like they are helping at this point. Fortunately there is no signs of active infection at this time. Patient History Information obtained from  Patient. Family History Diabetes - Mother, Heart Disease - Father, Stroke - Father, No  family history of Cancer, Hereditary Spherocytosis, Hypertension, Kidney Disease, Lung Disease, Seizures, Thyroid Problems, Tuberculosis. Social History Never smoker, Marital Status - Widowed, Alcohol Use - Never, Drug Use - No History, Caffeine Use - Moderate. Medical History Eyes Denies history of Cataracts, Glaucoma, Optic Neuritis Ear/Nose/Mouth/Throat Denies history of Chronic sinus problems/congestion, Middle ear problems Hematologic/Lymphatic Denies history of Anemia, Hemophilia, Human Immunodeficiency Virus, Lymphedema Respiratory Denies history of Aspiration, Asthma, Chronic Obstructive Pulmonary Disease (COPD), Pneumothorax, Sleep Apnea, Tuberculosis Cardiovascular Patient has history of Hypertension Denies history of Angina, Arrhythmia, Congestive Heart Failure, Coronary Artery Disease, Deep Vein Thrombosis, Hypotension, Myocardial Infarction, Peripheral Arterial Disease, Peripheral Venous Disease, Phlebitis, Vasculitis Gastrointestinal Denies history of Cirrhosis , Colitis, Crohn s, Hepatitis A, Hepatitis B, Hepatitis C Endocrine Patient has history of Type II Diabetes Denies history of Type I Diabetes Genitourinary Denies history of End Stage Renal Disease Immunological Denies history of Lupus Erythematosus, Raynaud s, Scleroderma Integumentary (Skin) Denies history of History of Burn, History of pressure wounds Musculoskeletal BILLYJO, HIDALGO ([id number]) Denies history of Gout, Rheumatoid Arthritis, Osteoarthritis, Osteomyelitis Neurologic Denies history of Dementia, Neuropathy, Quadriplegia, Paraplegia, Seizure Disorder Oncologic Denies history of Received Chemotherapy, Received Radiation Review of Systems (ROS) Constitutional Symptoms (General Health) Denies complaints or symptoms of Fatigue, Fever, Chills, Marked Weight Change. Respiratory Denies complaints or  symptoms of Chronic or frequent coughs, Shortness of Breath. Cardiovascular Denies complaints or symptoms of Chest pain, LE edema. Psychiatric Denies complaints or symptoms of Anxiety, Claustrophobia. Objective Constitutional Well-nourished and well-hydrated in no acute distress. Vitals Time Taken: 12:55 PM, Height: 72 in, Weight: 254 lbs, BMI: 34.4, Temperature: 98.8 F, Pulse: 78 bpm, Respiratory Rate: 18 breaths/min, Blood Pressure: 146/73 mmHg. Respiratory normal breathing without difficulty. clear to auscultation bilaterally. Cardiovascular regular rate and rhythm with normal S1, S2. Psychiatric this patient is able to make decisions and demonstrates good insight into disease process. Alert and Oriented x 3. pleasant and cooperative. General Notes: Patient's wound bed currently appears to be doing well no need for sharp debridement was noted today which is good news. There really was not any significant callus buildup either which is also good news. Integumentary (Hair, Skin) Wound #1 status is Open. Original cause of wound was Gradually Appeared. The wound is located on the Left Toe Great. The wound measures 0.5cm length x 0.3cm width x 0.2cm depth; 0.118cm^2 area and 0.024cm^3 volume. There is Fat Layer (Subcutaneous Tissue) Exposed exposed. There is no tunneling or undermining noted. There is a medium amount of serosanguineous drainage noted. The wound margin is flat and intact. There is no granulation within the wound bed. There is a large (67-100%) amount of necrotic tissue within the wound bed including Eschar and Adherent Slough. MUSTAF, FOUTCH ([id number]) Assessment Active Problems ICD-10 Type 2 diabetes mellitus with foot ulcer Non-pressure chronic ulcer of other part of left foot with fat layer exposed Essential (primary) hypertension Type 2 diabetes mellitus with diabetic autonomic (poly)neuropathy Plan Wound Cleansing: Wound #1 Left Toe Great: May  Shower, gently pat wound dry prior to applying new dressing. Anesthetic (add to Medication List): Wound #1 Left Toe Great: Topical Lidocaine 4% cream applied to wound bed prior to debridement (In Clinic Only). Primary Wound Dressing: Wound #1 Left Toe Great: Silver Collagen Secondary Dressing: Wound #1 Left Toe Great: Conform/Kerlix Foam Dressing Change Frequency: Wound #1 Left Toe Great: Change dressing every other day. Follow-up Appointments: Return Appointment in 1 week. Off-Loading: Wound #1 Left Toe Great: Open toe surgical shoe with peg assist. 1. My  suggestion at this time is can be that we go ahead and initiate treatment with a different dressing I would recommend collagen to try to help with additional granulation at this point. 2. I would recommend as well that we go ahead and continue as well with his offloading shoe as that seems to be beneficial and I think that still absolutely necessary based on what I am seeing. He seems to be doing well with this. 3. Obviously I want him to continue to be active but I do want him to take it easy as well when it comes down to excessive walking he needs to avoid that at this point. We will see patient back for reevaluation in 1 week here in the clinic. If anything worsens or changes patient will contact our office for additional recommendations. Electronic Signature(s) Signed: 12/19/2019 1:37:20 PM By: Worthy Keeler PA-C Entered By: Worthy Keeler on 12/19/2019 13:37:19 Sprunger, [name] ([id number]) MARTESE, HADERLIE ([id number]) -------------------------------------------------------------------------------- ROS/PFSH Details Patient Name: [name] Date of Service: 12/19/2019 12:45 PM Medical Record Number: [id number] Patient Account Number: [id number] Date of Birth/Sex: July 04, 1946 (73 y.o. M) Treating RN: Montey Hora Primary Care Provider: Frazier Richards Other Clinician: Referring Provider:  Frazier Richards Treating Provider/Extender: Melburn Hake, Tymir Terral Weeks in Treatment: 3 Information Obtained From Patient Constitutional Symptoms (General Health) Complaints and Symptoms: Negative for: Fatigue; Fever; Chills; Marked Weight Change Respiratory Complaints and Symptoms: Negative for: Chronic or frequent coughs; Shortness of Breath Medical History: Negative for: Aspiration; Asthma; Chronic Obstructive Pulmonary Disease (COPD); Pneumothorax; Sleep Apnea; Tuberculosis Cardiovascular Complaints and Symptoms: Negative for: Chest pain; LE edema Medical History: Positive for: Hypertension Negative for: Angina; Arrhythmia; Congestive Heart Failure; Coronary Artery Disease; Deep Vein Thrombosis; Hypotension; Myocardial Infarction; Peripheral Arterial Disease; Peripheral Venous Disease; Phlebitis; Vasculitis Psychiatric Complaints and Symptoms: Negative for: Anxiety; Claustrophobia Eyes Medical History: Negative for: Cataracts; Glaucoma; Optic Neuritis Ear/Nose/Mouth/Throat Medical History: Negative for: Chronic sinus problems/congestion; Middle ear problems Hematologic/Lymphatic Medical History: Negative for: Anemia; Hemophilia; Human Immunodeficiency Virus; Lymphedema Gastrointestinal KNOXTON, ANSLEY ([id number]) Medical History: Negative for: Cirrhosis ; Colitis; Crohnos; Hepatitis A; Hepatitis B; Hepatitis C Endocrine Medical History: Positive for: Type II Diabetes Negative for: Type I Diabetes Time with diabetes: 1990 Treated with: Insulin Blood sugar tested every day: Yes Tested : Genitourinary Medical History: Negative for: End Stage Renal Disease Immunological Medical History: Negative for: Lupus Erythematosus; Raynaudos; Scleroderma Integumentary (Skin) Medical History: Negative for: History of Burn; History of pressure wounds Musculoskeletal Medical History: Negative for: Gout; Rheumatoid Arthritis; Osteoarthritis;  Osteomyelitis Neurologic Medical History: Negative for: Dementia; Neuropathy; Quadriplegia; Paraplegia; Seizure Disorder Oncologic Medical History: Negative for: Received Chemotherapy; Received Radiation Immunizations Pneumococcal Vaccine: Received Pneumococcal Vaccination: Yes Implantable Devices None Family and Social History Cancer: No; Diabetes: Yes - Mother; Heart Disease: Yes - Father; Hereditary Spherocytosis: No; Hypertension: No; Kidney Disease: No; Lung Disease: No; Seizures: No; Stroke: Yes - Father; Thyroid Problems: No; Tuberculosis: No; Never smoker; Marital Status - Widowed; Alcohol Use: Never; Drug Use: No History; Caffeine Use: Moderate; Financial Concerns: No; Food, Clothing or Shelter Needs: No; Support System Lacking: No; Transportation Concerns: No Physician Affirmation I have reviewed and agree with the above information. IZAIHA, DUPUIS ([id number]) Electronic Signature(s) Unsigned Entered By: Worthy Keeler on 12/19/2019 13:35:29 Signature(s): Date(s): [name] ([id number]) -------------------------------------------------------------------------------- SuperBill Details Patient Name: [name] Date of Service: 12/19/2019 Medical Record Number: [id number] Patient Account Number: [id number] Date of Birth/Sex: [birth year] (73 y.o. M) Treating RN: Montey Hora Primary Care Provider:  Frazier Richards Other Clinician: Referring Provider: Frazier Richards Treating Provider/Extender: Melburn Hake, Jailin Moomaw Weeks in Treatment: 3 Diagnosis Coding ICD-10 Codes Code Description E11.621 Type 2 diabetes mellitus with foot ulcer L97.522 Non-pressure chronic ulcer of other part of left foot with fat layer exposed I10 Essential (primary) hypertension E11.43 Type 2 diabetes mellitus with diabetic autonomic (poly)neuropathy Facility Procedures CPT4 Code: [id number] Description: [id number] - WOUND CARE VISIT-LEV 3 EST  PT Modifier: Quantity: 1 Physician Procedures CPT4 Code: [id number] Description: [id number] - WC PHYS LEVEL 4 - EST PT ICD-10 Diagnosis Description 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

## 2019-12-26 ENCOUNTER — Other Ambulatory Visit: Payer: Self-pay

## 2019-12-26 ENCOUNTER — Encounter: Payer: Medicare Other | Admitting: Internal Medicine

## 2019-12-26 DIAGNOSIS — E11621 Type 2 diabetes mellitus with foot ulcer: Secondary | ICD-10-CM | POA: Diagnosis not present

## 2019-12-26 NOTE — Progress Notes (Addendum)
BETO, FOUGEROUSSE ([id number]) Visit Report for 12/26/2019 HPI Details Patient Name: [name], [name]. Date of Service: 12/26/2019 12:45 PM Medical Record Number: [id number] Patient Account Number: [id number] Date of Birth/Sex: [birth year] (73 y.o. M) Treating RN: Montey Hora Primary Care Provider: Frazier Richards Other Clinician: Referring Provider: Frazier Richards Treating Provider/Extender: Beverly Gust in Treatment: 4 History of Present Illness HPI Description: 12/12/18 on evaluation today patient presents as a referral from his endocrinologist regarding issues that he has been having with his left great toe. Subsequently he has been seeing Dr. Cleda Mccreedy and Dr. Cleda Mccreedy has been the breeding the wound and in fact this was debrided this morning. The patient had an appointment there prior to coming here. Subsequently he states that even though he's been going for regular debridement after office things really do not seem to be showing signs of improvement unfortunately. He states that he is having some discomfort but nothing too significant. No fevers, chills, nausea, or vomiting noted at this time. The patient does have a history of hypertension, neuropathy, diabetes mellitus type II, and a history of stroke. Currently he's been using bacitracin on the toe and utilizing a Band-Aid. He tells me he's had this wound for two years. He cannot remember the last time he had an x-ray. 12/19/18 on evaluation today patient appears to be doing well in regard to his plantar toe ulcer. He's been tolerating the dressing changes without complication. Fortunately there does not appear to be signs of infection at this time which is good news. No fever chills noted. 01/19/19 has been one month since I last saw the patient as he was out of town. With that being said on evaluation today it does appear that he is going to require sharp debridement and I do believe that the total contact  cast would benefit him as far as being applied at this time. He is in agreement that plan. 01/23/19 on evaluation today patient appears to be doing better in regard to his left great toe ulcer. He has been shown signs of improvement week by week and although this is slow it does seem to be doing fairly well which is good news. Overall very pleased in this regard. 01/31/19 on evaluation today patient presents for follow-up concerning his great toe ulcer. He was actually supposed to be here yesterday and he did come however he ended up having to leave due to his blood sugar dropping he states he just did not eat enough lunch. Fortunately seems to be doing much better today which is excellent news. Fortunately there's no evidence of infection at this time which is also good news. No fevers, chills, nausea, or vomiting noted at this time. Overall I feel like he is making excellent progress. 02/06/19 on evaluation today patient actually appears to be doing very well in regard to his plantar toe ulcer. He's been tolerating the dressing changes without complication. Fortunately there is no sign of infection at this time. Overall been very pleased with how things seem to be progressing. No fevers, chills, nausea, or vomiting noted at this time. 02/13/19 on evaluation today patient actually appears to be doing excellent in regard to his toe ulcer which is almost completely close. Overall very pleased with that. There does not appear to be any signs of infection which is good news. Upon close inspection it appears that he has just a very slight opening still noted at the central portion of the toe although this is minimal. 02/16/19 on evaluation  today patient is seen for early follow-up due to the fact that he tells me while at home he actually got up to go answer the door without putting on the walking boot part of his cast and subsequently damaged the total contact cast. It therefore comes in to have this  removed and potentially replaced. Fortunately other than it given him a little bit of pain its far as pushing in on some areas there doesn't appear to be any injury once the cast was removed. 03/13/19 on evaluation today patient unfortunately presents for follow-up concerning his great toe ulcer on the left. He was discharged on 02/16/19 with the wound healed at that point. He tells me this remain so for several weeks or least a couple [name], [name]. ([id number]) weeks before reopening. Fortunately there does not appear to be any signs of infection at this time. Nonetheless for the past week at least he's had the wound reopened and states it is been trying to keep pressure off of this but he has not been using the offloading shoe we previously gave him. Fortunately there's no evidence of infection at this time. No fevers, chills, nausea, or vomiting noted at this time. 03/17/19 on evaluation today patient actually appears to be doing rather well in regard to his toe also. Fact this appears to be significantly smaller this week even compared to what I saw last week on evaluation. He seems to have done very well for the offloading shoe and overall I'm very pleased with the progress that is made. It may be that he doesn't even need the cast to get this to heal if he offloads this appropriately. 03/24/19 on evaluation today patient actually appears to be doing well in regard to his plantar foot ulcer. He's been tolerating the dressing changes without complication the collagen does seem to be beneficial for him. With that being said he is gonna require some miles sharp debridement today to clear away some of the callous's around the edge of the wound as well as the minimal [place] noted on the surface of the wound. 03/31/19 on evaluation today patient appears to be doing well in regard to his plantar great toe ulcer. He has been tolerating the dressing changes without complication which is good news.  Fortunately there does not appear to be any signs of active infection at this time. Overall very pleased with how things seem to be progressing. 04/11/19 on evaluation today patient's tools are appears to be doing in my pinion roughly about the same as were things that previous. Fortunately there does not appear to be signs of active infection at this time which is good news. With that being said he has not been experiencing any pain at this time which is good news there is also No fevers, chills, nausea, or vomiting noted at this time. 04/18/19 on evaluation today patient's wound actually appears to be doing fairly well today he did not even really have a lot of callous buildup. We have been debating on whether or not to reinitiate total contact cast. With that being said he seems to be doing fairly well this point with offloading in my pinion and again I think we need to come up with a way to get this healed that will also be sustainable for keeping it close. Again we were able to close it with the cast previous but again he has to be able to keep it such. For that reason we are working on trying to  get him diabetic shoes he's [facility] with the local company that has to measure him for these do not want to have a cast on at such a time as when they need to actually measure him. 04/25/19 on evaluation today patient appears to be doing well in regard to his plantar foot specifically great toe ulcer. He's been tolerating the dressing changes without complication. Fortunately there's no signs of active infection at this time. Overall I'm very pleased with how things appear currently. 05/02/19 on evaluation today patient appears to actually be doing better in regard to his plantar foot ulcer. He shown signs of good improvement which is excellent news. She states he can walk better with the cast on the can with the offloading shoe that he had on previous. Fortunately there's no  signs of active infection at this time. No fevers, chills, nausea, or vomiting noted at this time. 05/09/19 on evaluation today patient actually appears to be doing excellent. In fact he appears to be completely healed based on evaluation at this time. Fortunately there's no signs of infection and is having no discomfort. 06/08/19 this is a follow-up visit although the patient has been healed for almost a month but not quite. He tells me that in the past several days he noted some blood coming from his toe and he thought he should come in at this checked out. Fortunately he's having a pain. He also did get his custom orthotics shoes. With that being said he still appears to have developed this ulceration there is something he tells me that the orthotic specialist stated they could do to the toes to try to help out in this regard although he did not know exactly what it was we may need to check with [facility]. 06/12/19 on evaluation today patient actually appears to be doing great in regard to his foot ulcer. The wound on his toes shown signs of excellent improvement which is great news. With that being said he is not completely close yet the cast has done wonders for him and he is definitely headed in the correct direction. No fevers, chills, nausea, or vomiting noted at this time. 06/22/19 on evaluation today fortunately patient actually appears to be healing quite well and in fact appears to be completely healed this is excellent news. Fortunately there's no signs of active infection he's been tolerating the total contact cast without complication. Overall I'm pleased with the progress that has been made. He also has his new custom shoes he brought was with him today. [name], [name] ([id number]) 07/04/19 on evaluation today patient actually appears to be doing a little bit worse in regard his toe ulcer unfortunately this has yet again reopened. It has only been about a week and a half since  I've last seen him I really did not expect this to reopen this quickly. Nonetheless I did discuss with him his normal routine in order to see what may be causing this. He has custom shoes. With that being said he apparently has not been using the custom shoes and even when he has used them he has not even put the insert in there which is what was custom molded to him in order to appropriately offload high-pressure point areas. In fact his exact question to me was whether or not he needed to actually put those in they just came loose in the box and he had never installed them. Nonetheless it also came to  light the he's been walking around in his bedroom slippers at home he tells me he does not go barefoot but he really has not been wearing his custom offloading/diabetic shoes. Nonetheless I do believe that this is why he continually reopens as far as the wound is concerned. I discussed this with the patient in great detail today for yet again although I previously had this discussion with him in the past. 07/11/19-Patient returns at 1 week for his left great toe wound which is actually doing really well, he has been using the insert to offload the toe and is very pleased with it. This looks like this is working out really well for him 07/18/19 on evaluation today patient appears to be doing really about the same with regard to his ulcer on the right great toe. He's been tolerating the dressing changes without complication. Fortunately there's no signs of active infection at this time. With that being said I think that we're having a difficult time getting this to heal and I do believe that initiating treatment with the total contact cast to get this to close would be a good idea. The good news is he seems to be keeping this from worsening so hopefully that means that if we get it closed he can prevent this from reopening in the future. At this point my suggestion was that we go ahead and initiate  treatment at both sites utilizing collagen. We will then subsequently put a piece of silver out and it between the toes of the right foot in the webbing between the fourth and fifth toe in order to help keep things dry and allow this to hopefully close and we heal without complication. The patient is in agreement with plan. Subsequently I am hopeful that both wounds will heal quite quickly we have been very close now with the ankle and again the toe has reopened but fortunately doesn't appear to be to bad at this point. No antibiotics were prescribed today as they did not appear to be necessary. 07/25/19 on evaluation today patient actually appears to be doing excellent in regard to his great toe ulcer. In fact this really appears to be almost completely healed if indeed it is not in fact healed. With that being said there is still the eighth small pinpoint area that could be open although I tend to doubt it. Nonetheless it is something that I would like to continue to watch for more week I do think it would be beneficial for [place] to go ahead and continue with the cast for one more week before deciding to discharge him especially in light of the issues he's had in the past. Specifically with regard to the wound reopening rather quickly. 08/01/2019 upon evaluation today patient actually appears to be doing well with regard to his great toe ulcer. He has been tolerating the dressing changes without complication. Fortunately we think appears to be completely healed at this time which is great news. I am very pleased with the progress up to this point. In fact he appears to be completely healed at this point. Readmission: 09/15/2019 patient presents today for reevaluation here in our clinic concerning issues that he is having with his left great toe as he did previous. We have not actually seen him since August 01, 2019. He had been doing very well until he noticed when walking into the bathroom with just the  socks on the other night that he had a little bit of bleeding from  the toe location. Subsequently he decided to make an appointment to come in as he did not want this to get significantly worse. There does not appear to be any signs of active infection he tells me as long as he was wearing his shoes he seemed to be doing okay is walking around not necessarily barefoot but just with socks on that I think may be his main problem at this time. 09/22/2019 on evaluation today patient actually appears to be doing quite well with regard to his great toe ulcer. This is making signs of good improvement even in the short amount of time we have been taking care of his toe since he came back. Is just been 1 week. Nonetheless the improvement in the wound size is excellent he also really has no depth and no evidence of infection which is great news. 09/29/2019 on evaluation today patient appears to be doing well with regard to his toe ulcer. This is showing signs of improvement in fact it was questioning whether or not today this actually was healed. However there did appear to be some callus covering the surface of the wound was removed there did appear to be a small pinpoint opening still in the middle of the wound although this is minimal. 10/06/2019 on evaluation today patient appears to be doing excellent in regard to his toe ulcer which actually showed signs of complete Epithelization today. There is no evidence of active infection which is good news. Overall he has been tolerating the cast without complication but I think he is done with that as of now. Readmission: [name], [name] ([id number]) 11/28/2019 on evaluation today patient appears to be unfortunately here today again for another issue with his left great toe. He has been having some significant problems intermittently over the past year or really that I been seeing him. Were able to heal him and then subsequently things will get worse yet  again. With that being said the patient tells me at this time that he began to have issues with drainage noted 2 weeks ago. He has no pain but again he is never really had any discomfort. No fevers, chills, nausea, vomiting, or diarrhea. He has been using his offloading shoes he tells me. 12/05/19 on evaluation today patient actually appears to be doing quite well with regard to the wound on his toe. He really hasn't been using the Pegasus offloading shoe for now. He tells me he actually forgot. With that being said there does not appear to be any signs of active infection at this time which is good news. No fevers, chills, nausea, or vomiting noted at this time. 12/12/2019 on evaluation today the patient appears to be doing quite well with regard to his toe ulcer. This does not seem to be showing any signs of worsening he has been using his custom shoes and that seems to be beneficial for him. Fortunately there is no sign of active infection at this point. No fevers, chills, nausea, vomiting, or diarrhea. 12/19/2019 on evaluation today patient appears to be doing well with regard to his toe ulcer. He has been tolerating dressing changes without complication he is wearing his offloading shoes and I do feel like they are helping at this point. Fortunately there is no signs of active infection at this time. 12/29-Patient comes in at 1 week visit with regards to his right great toe plantar ulcer, this has been improving, his continue to wear his offloading shoes which are definitely helping he  said he noted a very small trace of discharge at 1 point however wound looks stable Electronic Signature(s) Signed: 12/27/2019 12:18:42 PM By: Tobi Bastos MD, MBA Previous Signature: 12/26/2019 12:56:58 PM Version By: Tobi Bastos MD, MBA Entered By: Tobi Bastos on 12/27/2019 12:18:42 [name]  ([id number]) -------------------------------------------------------------------------------- Physical Exam Details Patient Name: [name], [name]. Date of Service: 12/26/2019 12:45 PM Medical Record Number: [id number] Patient Account Number: [id number] Date of Birth/Sex: 10/22/1946 (73 y.o. M) Treating RN: Montey Hora Primary Care Provider: Frazier Richards Other Clinician: Referring Provider: Frazier Richards Treating Provider/Extender: Beverly Gust in Treatment: 4 Constitutional alert and oriented x 3. sitting or standing blood pressure is within target range for patient.. supine blood pressure is within target range for patient.. pulse regular and within target range for patient.Marland Kitchen respirations regular, non-labored and within target range for patient.Marland Kitchen temperature within target range for patient.. . . Well-nourished and well-hydrated in no acute distress. Notes Right plantar great toe wound is small, thin callus rim, overall wound looks good, with intact surrounding skin, no maceration or excessive dryness or callus formation Electronic Signature(s) Signed: 12/26/2019 12:57:41 PM By: Tobi Bastos MD, MBA Entered By: Tobi Bastos on 12/26/2019 12:57:41 [name], [name] ([id number]) -------------------------------------------------------------------------------- Physician Orders Details Patient Name: [name]. Date of Service: 12/26/2019 12:45 PM Medical Record Number: [id number] Patient Account Number: [id number] Date of Birth/Sex: 04-12-1946 (73 y.o. M) Treating RN: Montey Hora Primary Care Provider: Frazier Richards Other Clinician: Referring Provider: Frazier Richards Treating Provider/Extender: Beverly Gust in Treatment: 4 Verbal / Phone Orders: No Diagnosis Coding Wound Cleansing Wound #1 Left Toe Great o May Shower, gently pat wound dry prior to applying new dressing. Anesthetic (add to Medication List) Wound  #1 Left Toe Great o Topical Lidocaine 4% cream applied to wound bed prior to debridement (In Clinic Only). Primary Wound Dressing Wound #1 Left Toe Great o Silver Collagen Secondary Dressing Wound #1 Left Toe Great o Conform/Kerlix o Foam Dressing Change Frequency Wound #1 Left Toe Great o Change dressing every other day. Follow-up Appointments o Return Appointment in 1 week. Off-Loading Wound #1 Left Toe Great o Open toe surgical shoe with peg assist. Electronic Signature(s) Signed: 12/26/2019 4:37:17 PM By: Tobi Bastos MD, MBA Signed: 12/26/2019 4:50:32 PM By: Montey Hora Entered By: Montey Hora on 12/26/2019 12:57:03 Recupero, [name] ([id number]) -------------------------------------------------------------------------------- Progress Note Details Patient Name: [name]. Date of Service: 12/26/2019 12:45 PM Medical Record Number: [id number] Patient Account Number: [id number] Date of Birth/Sex: 05-Apr-1946 (73 y.o. M) Treating RN: Montey Hora Primary Care Provider: Frazier Richards Other Clinician: Referring Provider: Frazier Richards Treating Provider/Extender: Beverly Gust in Treatment: 4 Subjective History of Present Illness (HPI) 12/12/18 on evaluation today patient presents as a referral from his endocrinologist regarding issues that he has been having with his left great toe. Subsequently he has been seeing Dr. Cleda Mccreedy and Dr. Cleda Mccreedy has been the breeding the wound and in fact this was debrided this morning. The patient had an appointment there prior to coming here. Subsequently he states that even though he's been going for regular debridement after office things really do not seem to be showing signs of improvement unfortunately. He states that he is having some discomfort but nothing too significant. No fevers, chills, nausea, or vomiting noted at this time. The patient does have a history of hypertension,  neuropathy, diabetes mellitus type II, and a history of stroke. Currently he's been using bacitracin on the toe and utilizing a Band-Aid. He tells  me he's had this wound for two years. He cannot remember the last time he had an x-ray. 12/19/18 on evaluation today patient appears to be doing well in regard to his plantar toe ulcer. He's been tolerating the dressing changes without complication. Fortunately there does not appear to be signs of infection at this time which is good news. No fever chills noted. 01/19/19 has been one month since I last saw the patient as he was out of town. With that being said on evaluation today it does appear that he is going to require sharp debridement and I do believe that the total contact cast would benefit him as far as being applied at this time. He is in agreement that plan. 01/23/19 on evaluation today patient appears to be doing better in regard to his left great toe ulcer. He has been shown signs of improvement week by week and although this is slow it does seem to be doing fairly well which is good news. Overall very pleased in this regard. 01/31/19 on evaluation today patient presents for follow-up concerning his great toe ulcer. He was actually supposed to be here yesterday and he did come however he ended up having to leave due to his blood sugar dropping he states he just did not eat enough lunch. Fortunately seems to be doing much better today which is excellent news. Fortunately there's no evidence of infection at this time which is also good news. No fevers, chills, nausea, or vomiting noted at this time. Overall I feel like he is making excellent progress. 02/06/19 on evaluation today patient actually appears to be doing very well in regard to his plantar toe ulcer. He's been tolerating the dressing changes without complication. Fortunately there is no sign of infection at this time. Overall been very pleased with how things seem to be progressing. No  fevers, chills, nausea, or vomiting noted at this time. 02/13/19 on evaluation today patient actually appears to be doing excellent in regard to his toe ulcer which is almost completely close. Overall very pleased with that. There does not appear to be any signs of infection which is good news. Upon close inspection it appears that he has just a very slight opening still noted at the central portion of the toe although this is minimal. 02/16/19 on evaluation today patient is seen for early follow-up due to the fact that he tells me while at home he actually got up to go answer the door without putting on the walking boot part of his cast and subsequently damaged the total contact cast. It therefore comes in to have this removed and potentially replaced. Fortunately other than it given him a little bit of pain its far as pushing in on some areas there doesn't appear to be any injury once the cast was removed. 03/13/19 on evaluation today patient unfortunately presents for follow-up concerning his great toe ulcer on the left. He was discharged on 02/16/19 with the wound healed at that point. He tells me this remain so for several weeks or least a couple weeks before reopening. Fortunately there does not appear to be any signs of infection at this time. Nonetheless for the past week at least he's had the wound reopened and states it is been trying to keep pressure off of this but he has not been using the offloading shoe we previously gave him. Fortunately there's no evidence of infection at this time. No fevers, chills, nausea, [name], [name]. ([id number]) or vomiting noted at  this time. 03/17/19 on evaluation today patient actually appears to be doing rather well in regard to his toe also. Fact this appears to be significantly smaller this week even compared to what I saw last week on evaluation. He seems to have done very well for the offloading shoe and overall I'm very pleased with the progress  that is made. It may be that he doesn't even need the cast to get this to heal if he offloads this appropriately. 03/24/19 on evaluation today patient actually appears to be doing well in regard to his plantar foot ulcer. He's been tolerating the dressing changes without complication the collagen does seem to be beneficial for him. With that being said he is gonna require some miles sharp debridement today to clear away some of the callous's around the edge of the wound as well as the minimal Slough noted on the surface of the wound. 03/31/19 on evaluation today patient appears to be doing well in regard to his plantar great toe ulcer. He has been tolerating the dressing changes without complication which is good news. Fortunately there does not appear to be any signs of active infection at this time. Overall very pleased with how things seem to be progressing. 04/11/19 on evaluation today patient's tools are appears to be doing in my pinion roughly about the same as were things that previous. Fortunately there does not appear to be signs of active infection at this time which is good news. With that being said he has not been experiencing any pain at this time which is good news there is also No fevers, chills, nausea, or vomiting noted at this time. 04/18/19 on evaluation today patient's wound actually appears to be doing fairly well today he did not even really have a lot of callous buildup. We have been debating on whether or not to reinitiate total contact cast. With that being said he seems to be doing fairly well this point with offloading in my pinion and again I think we need to come up with a way to get this healed that will also be sustainable for keeping it close. Again we were able to close it with the cast previous but again he has to be able to keep it such. For that reason we are working on trying to get him diabetic shoes he's waiting for the [employer] to get in touch with the local company  that has to measure him for these do not want to have a cast on at such a time as when they need to actually measure him. 04/25/19 on evaluation today patient appears to be doing well in regard to his plantar foot specifically great toe ulcer. He's been tolerating the dressing changes without complication. Fortunately there's no signs of active infection at this time. Overall I'm very pleased with how things appear currently. 05/02/19 on evaluation today patient appears to actually be doing better in regard to his plantar foot ulcer. He shown signs of good improvement which is excellent news. She states he can walk better with the cast on the can with the offloading shoe that he had on previous. Fortunately there's no signs of active infection at this time. No fevers, chills, nausea, or vomiting noted at this time. 05/09/19 on evaluation today patient actually appears to be doing excellent. In fact he appears to be completely healed based on evaluation at this time. Fortunately there's no signs of infection and is having no discomfort. 06/08/19 this is a follow-up visit although  the patient has been healed for almost a month but not quite. He tells me that in the past several days he noted some blood coming from his toe and he thought he should come in at this checked out. Fortunately he's having a pain. He also did get his custom orthotics shoes. With that being said he still appears to have developed this ulceration there is something he tells me that the orthotic specialist stated they could do to the toes to try to help out in this regard although he did not know exactly what it was we may need to check with [facility]. 06/12/19 on evaluation today patient actually appears to be doing great in regard to his foot ulcer. The wound on his toes shown signs of excellent improvement which is great news. With that being said he is not completely close yet the cast has done wonders for him and he is  definitely headed in the correct direction. No fevers, chills, nausea, or vomiting noted at this time. 06/22/19 on evaluation today fortunately patient actually appears to be healing quite well and in fact appears to be completely healed this is excellent news. Fortunately there's no signs of active infection he's been tolerating the total contact cast without complication. Overall I'm pleased with the progress that has been made. He also has his new custom shoes he brought was with him today. 07/04/19 on evaluation today patient actually appears to be doing a little bit worse in regard his toe ulcer unfortunately this has yet again reopened. It has only been about a week and a half since I've last seen him I really did not expect this to reopen this quickly. Nonetheless I did discuss with him his normal routine in order to see what may be causing this. He has custom [name], Fred L. ([id number]) shoes. With that being said he apparently has not been using the custom shoes and even when he has used them he has not even put the insert in there which is what was custom molded to him in order to appropriately offload high-pressure point areas. In fact his exact question to me was whether or not he needed to actually put those in they just came loose in the box and he had never installed them. Nonetheless it also came to light the he's been walking around in his bedroom slippers at home he tells me he does not go barefoot but he really has not been wearing his custom offloading/diabetic shoes. Nonetheless I do believe that this is why he continually reopens as far as the wound is concerned. I discussed this with the patient in great detail today for yet again although I previously had this discussion with him in the past. 07/11/19-Patient returns at 1 week for his left great toe wound which is actually doing really well, he has been using the insert to offload the toe and is very pleased with it.  This looks like this is working out really well for him 07/18/19 on evaluation today patient appears to be doing really about the same with regard to his ulcer on the right great toe. He's been tolerating the dressing changes without complication. Fortunately there's no signs of active infection at this time. With that being said I think that we're having a difficult time getting this to heal and I do believe that initiating treatment with the total contact cast to get this to close would be a good idea. The good news is he seems to  be keeping this from worsening so hopefully that means that if we get it closed he can prevent this from reopening in the future. At this point my suggestion was that we go ahead and initiate treatment at both sites utilizing collagen. We will then subsequently put a piece of silver out and it between the toes of the right foot in the webbing between the fourth and fifth toe in order to help keep things dry and allow this to hopefully close and we heal without complication. The patient is in agreement with plan. Subsequently I am hopeful that both wounds will heal quite quickly we have been very close now with the ankle and again the toe has reopened but fortunately doesn't appear to be to bad at this point. No antibiotics were prescribed today as they did not appear to be necessary. 07/25/19 on evaluation today patient actually appears to be doing excellent in regard to his great toe ulcer. In fact this really appears to be almost completely healed if indeed it is not in fact healed. With that being said there is still the eighth small pinpoint area that could be open although I tend to doubt it. Nonetheless it is something that I would like to continue to watch for more week I do think it would be beneficial for [place] to go ahead and continue with the cast for one more week before deciding to discharge him especially in light of the issues he's had in the past. Specifically  with regard to the wound reopening rather quickly. 08/01/2019 upon evaluation today patient actually appears to be doing well with regard to his great toe ulcer. He has been tolerating the dressing changes without complication. Fortunately we think appears to be completely healed at this time which is great news. I am very pleased with the progress up to this point. In fact he appears to be completely healed at this point. Readmission: 09/15/2019 patient presents today for reevaluation here in our clinic concerning issues that he is having with his left great toe as he did previous. We have not actually seen him since August 01, 2019. He had been doing very well until he noticed when walking into the bathroom with just the socks on the other night that he had a little bit of bleeding from the toe location. Subsequently he decided to make an appointment to come in as he did not want this to get significantly worse. There does not appear to be any signs of active infection he tells me as long as he was wearing his shoes he seemed to be doing okay is walking around not necessarily barefoot but just with socks on that I think may be his main problem at this time. 09/22/2019 on evaluation today patient actually appears to be doing quite well with regard to his great toe ulcer. This is making signs of good improvement even in the short amount of time we have been taking care of his toe since he came back. Is just been 1 week. Nonetheless the improvement in the wound size is excellent he also really has no depth and no evidence of infection which is great news. 09/29/2019 on evaluation today patient appears to be doing well with regard to his toe ulcer. This is showing signs of improvement in fact it was questioning whether or not today this actually was healed. However there did appear to be some callus covering the surface of the wound was removed there did appear to be a  small pinpoint opening still in the  middle of the wound although this is minimal. 10/06/2019 on evaluation today patient appears to be doing excellent in regard to his toe ulcer which actually showed signs of complete Epithelization today. There is no evidence of active infection which is good news. Overall he has been tolerating the cast without complication but I think he is done with that as of now. Readmission: 11/28/2019 on evaluation today patient appears to be unfortunately here today again for another issue with his left great toe. He has been having some significant problems intermittently over the past year or really that I been seeing him. Were able to [name], [name] ([id number]) heal him and then subsequently things will get worse yet again. With that being said the patient tells me at this time that he began to have issues with drainage noted 2 weeks ago. He has no pain but again he is never really had any discomfort. No fevers, chills, nausea, vomiting, or diarrhea. He has been using his offloading shoes he tells me. 12/05/19 on evaluation today patient actually appears to be doing quite well with regard to the wound on his toe. He really hasn't been using the Pegasus offloading shoe for now. He tells me he actually forgot. With that being said there does not appear to be any signs of active infection at this time which is good news. No fevers, chills, nausea, or vomiting noted at this time. 12/12/2019 on evaluation today the patient appears to be doing quite well with regard to his toe ulcer. This does not seem to be showing any signs of worsening he has been using his custom shoes and that seems to be beneficial for him. Fortunately there is no sign of active infection at this point. No fevers, chills, nausea, vomiting, or diarrhea. 12/19/2019 on evaluation today patient appears to be doing well with regard to his toe ulcer. He has been tolerating dressing changes without complication he is wearing his  offloading shoes and I do feel like they are helping at this point. Fortunately there is no signs of active infection at this time. 12/29-Patient comes in at 1 week visit with regards to his right great toe plantar ulcer, this has been improving, his continue to wear his offloading shoes which are definitely helping he said he noted a very small trace of discharge at 1 point however wound looks stable Objective Constitutional alert and oriented x 3. sitting or standing blood pressure is within target range for patient.. supine blood pressure is within target range for patient.. pulse regular and within target range for patient.Marland Kitchen respirations regular, non-labored and within target range for patient.Marland Kitchen temperature within target range for patient.. Well-nourished and well-hydrated in no acute distress. Vitals Time Taken: 12:55 PM, Height: 72 in, Weight: 254 lbs, BMI: 34.4, Temperature: 98.4 F, Pulse: 96 bpm, Respiratory Rate: 18 breaths/min, Blood Pressure: 142/84 mmHg. General Notes: Right plantar great toe wound is small, thin callus rim, overall wound looks good, with intact surrounding skin, no maceration or excessive dryness or callus formation Integumentary (Hair, Skin) Wound #1 status is Open. Original cause of wound was Gradually Appeared. The wound is located on the Left Toe Great. The wound measures 0.3cm length x 0.2cm width x 0.3cm depth; 0.047cm^2 area and 0.014cm^3 volume. There is Fat Layer (Subcutaneous Tissue) Exposed exposed. There is a medium amount of serosanguineous drainage noted. The wound margin is flat and intact. There is no granulation within the wound bed. There is a  large (67-100%) amount of necrotic tissue within the wound bed including Eschar and Adherent Slough. Plan Wound Cleansing: Wound #1 Left Toe GreatJOVONI, WOOTTEN ([id number]) May Shower, gently pat wound dry prior to applying new dressing. Anesthetic (add to Medication List): Wound #1 Left Toe  Great: Topical Lidocaine 4% cream applied to wound bed prior to debridement (In Clinic Only). Primary Wound Dressing: Wound #1 Left Toe Great: Silver Collagen Secondary Dressing: Wound #1 Left Toe Great: Conform/Kerlix Foam Dressing Change Frequency: Wound #1 Left Toe Great: Change dressing every other day. Follow-up Appointments: Return Appointment in 1 week. Off-Loading: Wound #1 Left Toe Great: Open toe surgical shoe with peg assist. 1. Continue using silver collagen dressing changed every other day 2. Return to clinic next week, wound appears to be improving on the whole 3. Continue using offloading which she is currently doing Engineer, maintenance) Signed: 12/27/2019 12:19:07 PM By: Tobi Bastos MD, MBA Previous Signature: 12/26/2019 12:58:35 PM Version By: Tobi Bastos MD, MBA Entered By: Tobi Bastos on 12/27/2019 12:19:06 [name], [name] ([id number]) -------------------------------------------------------------------------------- SuperBill Details Patient Name: [name] Date of Service: 12/26/2019 Medical Record Number: [id number] Patient Account Number: [id number] Date of Birth/Sex: [birth year] (73 y.o. 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.522 Non-pressure chronic ulcer of other part of left foot with fat layer exposed I10 Essential (primary) hypertension E11.43 Type 2 diabetes mellitus with diabetic autonomic (poly)neuropathy Facility Procedures CPT4 Code: [id number] Description: 99213 - WOUND CARE VISIT-LEV 3 EST PT Modifier: Quantity: 1 Physician Procedures CPT4 Code: [id number] Description: [id number] - WC PHYS LEVEL 3 - EST PT ICD-10 Diagnosis Description E11.621 Type 2 diabetes  mellitus with foot ulcer Modifier: Quantity: 1 Electronic Signature(s) Signed: 12/26/2019 12:58:52 PM By: Tobi Bastos MD, MBA Entered By: Tobi Bastos on 12/26/2019 12:58:51

## 2019-12-27 NOTE — Progress Notes (Signed)
[name], [name] ([id number]) Visit Report for 12/26/2019 Arrival Information Details Patient Name: [name], [name]. Date of Service: 12/26/2019 12:45 PM Medical Record Number: [id number] Patient Account Number: [id number] Date of Birth/Sex: Feb 04, 1946 (73 y.o. M) Treating RN: Army Melia Primary Care Rudolpho Claxton: Frazier Richards Other Clinician: Referring Clayten Allcock: Frazier Richards Treating Ellowyn Rieves/Extender: Beverly Gust in Treatment: 4 Visit Information History Since Last Visit Added or deleted any medications: No Patient Arrived: [name] Any new allergies or adverse reactions: No Arrival Time: 12:47 Had a fall or experienced change in No Accompanied By: self activities of daily living that may affect Transfer Assistance: None risk of falls: Patient Identification Verified: Yes Signs or symptoms of abuse/neglect since last visito No Hospitalized since last visit: No Has Dressing in Place as Prescribed: Yes Pain Present Now: No Electronic Signature(s) Signed: 12/27/2019 9:57:21 AM By: Army Melia Entered By: Army Melia on 12/26/2019 12:47:14 [place], [name] ([id number]) -------------------------------------------------------------------------------- Clinic Level of Care Assessment Details Patient Name: [name] Date of Service: 12/26/2019 12:45 PM Medical Record Number: [id number] Patient Account Number: [id number] Date of Birth/Sex: February 07, 1946 (73 y.o. M) Treating RN: Montey Hora Primary Care Cailie Bosshart: Frazier Richards Other Clinician: Referring Teala Daffron: Frazier Richards Treating Zyaire Dumas/Extender: Beverly Gust in Treatment: 4 Clinic Level of Care Assessment Items TOOL 4 Quantity Score []  - Use when only an EandM is performed on FOLLOW-UP visit 0 ASSESSMENTS - Nursing Assessment / Reassessment X - Reassessment of Co-morbidities (includes updates in patient status) 1 10 X- 1 5 Reassessment of Adherence to  Treatment Plan ASSESSMENTS - Wound and Skin Assessment / Reassessment X - Simple Wound Assessment / Reassessment - one wound 1 5 []  - 0 Complex Wound Assessment / Reassessment - multiple wounds []  - 0 Dermatologic / Skin Assessment (not related to wound area) ASSESSMENTS - Focused Assessment []  - Circumferential Edema Measurements - multi extremities 0 []  - 0 Nutritional Assessment / Counseling / Intervention X- 1 5 Lower Extremity Assessment (monofilament, tuning fork, pulses) []  - 0 Peripheral Arterial Disease Assessment (using hand held doppler) ASSESSMENTS - Ostomy and/or Continence Assessment and Care []  - Incontinence Assessment and Management 0 []  - 0 Ostomy Care Assessment and Management (repouching, etc.) PROCESS - Coordination of Care X - Simple Patient / Family Education for ongoing care 1 15 []  - 0 Complex (extensive) Patient / Family Education for ongoing care X- 1 10 Staff obtains Programmer, systems, Records, Test Results / Process Orders []  - 0 Staff telephones HHA, Nursing Homes / Clarify orders / etc []  - 0 Routine Transfer to another Facility (non-emergent condition) []  - 0 Routine Hospital Admission (non-emergent condition) []  - 0 New Admissions / Biomedical engineer / Ordering NPWT, Apligraf, etc. []  - 0 Emergency Hospital Admission (emergent condition) X- 1 10 Simple Discharge Coordination [name], [name] ([id number]) []  - 0 Complex (extensive) Discharge Coordination PROCESS - Special Needs []  - Pediatric / Minor Patient Management 0 []  - 0 Isolation Patient Management []  - 0 Hearing / Language / Visual special needs []  - 0 Assessment of Community assistance (transportation, D/C planning, etc.) []  - 0 Additional assistance / Altered mentation []  - 0 Support Surface(s) Assessment (bed, cushion, seat, etc.) INTERVENTIONS - Wound Cleansing / Measurement X - Simple Wound Cleansing - one wound 1 5 []  - 0 Complex Wound Cleansing - multiple  wounds X- 1 5 Wound Imaging (photographs - any number of wounds) []  - 0 Wound Tracing (instead of photographs) X- 1 5 Simple Wound Measurement - one wound []  - 0  Complex Wound Measurement - multiple wounds INTERVENTIONS - Wound Dressings X - Small Wound Dressing one or multiple wounds 1 10 []  - 0 Medium Wound Dressing one or multiple wounds []  - 0 Large Wound Dressing one or multiple wounds []  - 0 Application of Medications - topical []  - 0 Application of Medications - injection INTERVENTIONS - Miscellaneous []  - External ear exam 0 []  - 0 Specimen Collection (cultures, biopsies, blood, body fluids, etc.) []  - 0 Specimen(s) / Culture(s) sent or taken to Lab for analysis []  - 0 Patient Transfer (multiple staff / Civil Service fast streamer / Similar devices) []  - 0 Simple Staple / Suture removal (25 or less) []  - 0 Complex Staple / Suture removal (26 or more) []  - 0 Hypo / Hyperglycemic Management (close monitor of Blood Glucose) []  - 0 Ankle / Brachial Index (ABI) - do not check if billed separately X- 1 5 Vital Signs [name], [name] ([id number]) Has the patient been seen at the hospital within the last three years: Yes Total Score: 90 Level Of Care: New/Established - Level 3 Electronic Signature(s) Signed: 12/26/2019 4:50:32 PM By: Montey Hora Entered By: Montey Hora on 12/26/2019 12:57:28 [name] ([id number]) -------------------------------------------------------------------------------- Encounter Discharge Information Details Patient Name: [name]. Date of Service: 12/26/2019 12:45 PM Medical Record Number: [id number] Patient Account Number: [id number] Date of Birth/Sex: 1946/01/15 (73 y.o. M) Treating RN: Montey Hora Primary Care Shanele Nissan: Frazier Richards Other Clinician: Referring Jamille Yoshino: Frazier Richards Treating Kenlei Safi/Extender: Beverly Gust in Treatment: 4 Encounter Discharge Information Items Discharge  Condition: Stable Ambulatory Status: Ambulatory Discharge Destination: Home Transportation: Private Auto Accompanied By: self Schedule Follow-up Appointment: Yes Clinical Summary of Care: Electronic Signature(s) Signed: 12/26/2019 4:50:32 PM By: Montey Hora Entered By: Montey Hora on 12/26/2019 12:58:11 [place], [name] ([id number]) -------------------------------------------------------------------------------- Lower Extremity Assessment Details Patient Name: [name]. Date of Service: 12/26/2019 12:45 PM Medical Record Number: [id number] Patient Account Number: [id number] Date of Birth/Sex: March 02, 1946 (73 y.o. M) Treating RN: Army Melia Primary Care Kimberlyann Hollar: Frazier Richards Other Clinician: Referring Luc Shammas: Frazier Richards Treating Meredyth Hornung/Extender: Beverly Gust in Treatment: 4 Edema Assessment Assessed: [name]: No] [Right: No] Edema: [Left: N] [Right: o] Vascular Assessment Pulses: Dorsalis Pedis Palpable: [Left:Yes] Electronic Signature(s) Signed: 12/27/2019 9:57:21 AM By: Army Melia Entered By: Army Melia on 12/26/2019 12:49:57 [place], [name] ([id number]) -------------------------------------------------------------------------------- Multi Wound Chart Details Patient Name: [name]. Date of Service: 12/26/2019 12:45 PM Medical Record Number: [id number] Patient Account Number: [id number] Date of Birth/Sex: [birth year] (73 y.o. M) Treating RN: Montey Hora Primary Care Toshie Demelo: Frazier Richards Other Clinician: Referring  Wenzlick: Frazier Richards Treating Matan Steen/Extender: Beverly Gust in Treatment: 4 Photos: [N/A:N/A] Wound Location: Left Toe Great N/A N/A Wounding Event: Gradually Appeared N/A N/A Primary Etiology: Diabetic Wound/Ulcer of the N/A N/A Lower Extremity Comorbid History: Hypertension, Type II Diabetes N/A N/A Date Acquired: 11/14/2019 N/A N/A Weeks of Treatment:  4 N/A N/A Wound Status: Open N/A N/A Measurements L x W x D 0.3x0.2x0.3 N/A N/A (cm) Area (cm) : 0.047 N/A N/A Volume (cm) : 0.014 N/A N/A % Reduction in Area: 25.40% N/A N/A % Reduction in Volume: 63.20% N/A N/A Classification: Grade 1 N/A N/A Exudate Amount: Medium N/A N/A Exudate Type: Serosanguineous N/A N/A Exudate Color: red, brown N/A N/A Wound Margin: Flat and Intact N/A N/A Granulation Amount: None Present (0%) N/A N/A Necrotic Amount: Large (67-100%) N/A N/A Necrotic Tissue: Eschar, Adherent Slough N/A N/A Exposed Structures: Fat Layer (Subcutaneous N/A N/A Tissue) Exposed: Yes Fascia: No  Tendon: No Muscle: No Joint: No Bone: No Epithelialization: None N/A N/A Treatment Notes Electronic Signature(s) Signed: 12/26/2019 4:50:32 PM By: Montey Hora Entered By: Montey Hora on 12/26/2019 12:55:17 [place], [name] ([id number]) [name], [name] ([id number]) -------------------------------------------------------------------------------- Chance Details Patient Name: [name], [name]. Date of Service: 12/26/2019 12:45 PM Medical Record Number: [id number] Patient Account Number: [id number] Date of Birth/Sex: 1946/05/27 (73 y.o. M) Treating RN: Montey Hora Primary Care Chudney Scheffler: Frazier Richards Other Clinician: Referring Jarvis Knodel: Frazier Richards Treating Chasmine Lender/Extender: Beverly Gust in Treatment: 4 Active Inactive Abuse / Safety / Falls / Self Care Management Nursing Diagnoses: Potential for falls Goals: Patient will not experience any injury related to falls Date Initiated: 11/28/2019 Target Resolution Date: 03/09/2020 Goal Status: Active Interventions: Assess fall risk on admission and as needed Notes: Necrotic Tissue Nursing Diagnoses: Impaired tissue integrity related to necrotic/devitalized tissue Goals: Necrotic/devitalized tissue will be minimized in the wound bed Date Initiated:  11/28/2019 Target Resolution Date: 03/09/2020 Goal Status: Active Interventions: Provide education on necrotic tissue and debridement process Notes: Nutrition Nursing Diagnoses: Potential for alteratiion in Nutrition/Potential for imbalanced nutrition Goals: Patient/caregiver agrees to and verbalizes understanding of need to use nutritional supplements and/or vitamins as prescribed Date Initiated: 11/28/2019 Target Resolution Date: 03/09/2020 Goal Status: Active Interventions: Assess patient nutrition upon admission and as needed per policy [name], [name] ([id number]) Notes: Orientation to the Wound Care Program Nursing Diagnoses: Knowledge deficit related to the wound healing center program Goals: Patient/caregiver will verbalize understanding of the [place] Program Date Initiated: 11/28/2019 Target Resolution Date: 03/09/2020 Goal Status: Active Interventions: Provide education on orientation to the wound center Notes: Wound/Skin Impairment Nursing Diagnoses: Impaired tissue integrity Goals: Ulcer/skin breakdown will heal within 14 weeks Date Initiated: 11/28/2019 Target Resolution Date: 03/09/2020 Goal Status: Active Interventions: Assess patient/caregiver ability to obtain necessary supplies Assess patient/caregiver ability to perform ulcer/skin care regimen upon admission and as needed Assess ulceration(s) every visit Notes: Electronic Signature(s) Signed: 12/26/2019 4:50:32 PM By: Montey Hora Entered By: Montey Hora on 12/26/2019 12:55:06 [name] ([id number]) -------------------------------------------------------------------------------- Pain Assessment Details Patient Name: [name]. Date of Service: 12/26/2019 12:45 PM Medical Record Number: [id number] Patient Account Number: [id number] Date of Birth/Sex: 12-22-1946 (73 y.o. M) Treating RN: Army Melia Primary Care Darcia Lampi: Frazier Richards Other  Clinician: Referring Tashanda Fuhrer: Frazier Richards Treating Shantasia Hunnell/Extender: Beverly Gust in Treatment: 4 Active Problems Location of Pain Severity and Description of Pain Patient Has Paino No Site Locations Pain Management and Medication Current Pain Management: Electronic Signature(s) Signed: 12/27/2019 9:57:21 AM By: Army Melia Entered By: Army Melia on 12/26/2019 12:47:20 [name], [name] ([id number]) -------------------------------------------------------------------------------- Patient/Caregiver Education Details Patient Name: [name]. Date of Service: 12/26/2019 12:45 PM Medical Record Number: [id number] Patient Account Number: [id number] Date of Birth/Gender: [birth year] (73 y.o. M) Treating RN: Montey Hora Primary Care Physician: Frazier Richards Other Clinician: Referring Physician: Frazier Richards Treating Physician/Extender: Beverly Gust in Treatment: 4 Education Assessment Education Provided To: Patient Education Topics Provided Wound/Skin Impairment: Handouts: Other: wound care as ordered Methods: Demonstration, Explain/Verbal Responses: State content correctly Electronic Signature(s) Signed: 12/26/2019 4:50:32 PM By: Montey Hora Entered By: Montey Hora on 12/26/2019 12:57:47 [name], [name] ([id number]) -------------------------------------------------------------------------------- Wound Assessment Details Patient Name: [name]. Date of Service: 12/26/2019 12:45 PM Medical Record Number: [id number] Patient Account Number: [id number] Date of Birth/Sex: Aug 29, 1946 (73 y.o. M) Treating RN: Army Melia Primary Care Robert Sperl: Frazier Richards Other Clinician: Referring Kaoru Benda: Frazier Richards Treating Kyaira Trantham/Extender: Beverly Gust in Treatment: 4  Wound Status Wound Number: 1 Primary Etiology: Diabetic Wound/Ulcer of the Lower Extremity Wound Location: Left Toe  Great Wound Status: Open Wounding Event: Gradually Appeared Comorbid Hypertension, Type II Diabetes Date Acquired: 11/14/2019 History: Weeks Of Treatment: 4 Clustered Wound: No Photos Wound Measurements Length: (cm) 0.3 % Reduction i Width: (cm) 0.2 % Reduction i Depth: (cm) 0.3 Epithelializa Area: (cm) 0.047 Volume: (cm) 0.014 n Area: 25.4% n Volume: 63.2% tion: None Wound Description Classification: Grade 1 Foul Odor Aft Wound Margin: Flat and Intact Slough/Fibrin Exudate Amount: Medium Exudate Type: Serosanguineous Exudate Color: red, brown er Cleansing: No o Yes Wound Bed Granulation Amount: None Present (0%) Exposed Structure Necrotic Amount: Large (67-100%) Fascia Exposed: No Necrotic Quality: Eschar, Adherent Slough Fat Layer (Subcutaneous Tissue) Exposed: Yes Tendon Exposed: No Muscle Exposed: No Joint Exposed: No Bone Exposed: No Treatment Notes RIGGINS, HANSON ([id number]) Wound #1 (Left Toe Great) Notes prisma, foam and conform Electronic Signature(s) Signed: 12/27/2019 9:57:21 AM By: Army Melia Entered By: Army Melia on 12/26/2019 12:49:34 [place], [name] ([id number]) -------------------------------------------------------------------------------- Vitals Details Patient Name: [name]. Date of Service: 12/26/2019 12:45 PM Medical Record Number: [id number] Patient Account Number: [id number] Date of Birth/Sex: 08/26/1946 (73 y.o. M) Treating RN: Montey Hora Primary Care Edris Schneck: Frazier Richards Other Clinician: Referring Daviel Allegretto: Frazier Richards Treating Maverik Foot/Extender: Beverly Gust in Treatment: 4 Vital Signs Time Taken: 12:55 Temperature (F): 98.4 Height (in): 72 Pulse (bpm): 96 Weight (lbs): 254 Respiratory Rate (breaths/min): 18 Body Mass Index (BMI): 34.4 Blood Pressure (mmHg): 142/84 Reference Range: 80 - 120 mg / dl Electronic Signature(s) Signed: 12/26/2019 4:50:32 PM By:  Montey Hora Entered By: Montey Hora on 12/26/2019 12:56:24

## 2020-01-02 ENCOUNTER — Encounter: Payer: Medicare Other | Attending: Physician Assistant | Admitting: Physician Assistant

## 2020-01-02 ENCOUNTER — Other Ambulatory Visit: Payer: Self-pay

## 2020-01-02 DIAGNOSIS — I10 Essential (primary) hypertension: Secondary | ICD-10-CM | POA: Insufficient documentation

## 2020-01-02 DIAGNOSIS — E11621 Type 2 diabetes mellitus with foot ulcer: Secondary | ICD-10-CM | POA: Diagnosis not present

## 2020-01-02 DIAGNOSIS — E1142 Type 2 diabetes mellitus with diabetic polyneuropathy: Secondary | ICD-10-CM | POA: Insufficient documentation

## 2020-01-02 DIAGNOSIS — L97522 Non-pressure chronic ulcer of other part of left foot with fat layer exposed: Secondary | ICD-10-CM | POA: Diagnosis not present

## 2020-01-02 NOTE — Progress Notes (Addendum)
[name], [name] ([id number]) Visit Report for 01/02/2020 Chief Complaint Document Details Patient Name: [name], [name]. Date of Service: 01/02/2020 12:45 PM Medical Record Number: [id number] Patient Account Number: [id number] Date of Birth/Sex: 01-Jan-1946 (74 y.o. M) Treating RN: Montey Hora Primary Care Provider: Frazier Richards Other Clinician: Referring Provider: Frazier Richards Treating Provider/Extender: Melburn Hake, Eilis Chestnutt Weeks in Treatment: 5 Information Obtained from: Patient Chief Complaint Left great toe ulcer Electronic Signature(s) Signed: 01/02/2020 12:56:54 PM By: Worthy Keeler PA-C Entered By: Worthy Keeler on 01/02/2020 [id number] [name], [name] ([id number]) -------------------------------------------------------------------------------- Debridement Details Patient Name: [name] Date of Service: 01/02/2020 12:45 PM Medical Record Number: [id number] Patient Account Number: [id number] Date of Birth/Sex: [birth year] (74 y.o. M) Treating RN: Montey Hora Primary Care Provider: Frazier Richards Other Clinician: Referring Provider: Frazier Richards Treating Provider/Extender: Melburn Hake, Anagha Loseke Weeks in Treatment: 5 Debridement Performed for Wound #1 Left Toe Great Assessment: Performed By: Physician STONE III, Laruen Risser E., PA-C Debridement Type: Debridement Severity of Tissue Pre Fat layer exposed Debridement: Level of Consciousness (Pre- Awake and Alert procedure): Pre-procedure Verification/Time Yes - 12:59 Out Taken: Start Time: 12:59 Pain Control: Lidocaine 4% Topical Solution Total Area Debrided (L x W): 0.3 (cm) x 0.2 (cm) = 0.06 (cm) Tissue and other material Viable, Non-Viable, Callus, Slough, Subcutaneous, Slough debrided: Level: Skin/Subcutaneous Tissue Debridement Description: Excisional Instrument: Curette Bleeding: Minimum Hemostasis Achieved: Pressure End Time: 13:02 Procedural Pain: 0 Post Procedural  Pain: 0 Response to Treatment: Procedure was tolerated well Level of Consciousness Awake and Alert (Post-procedure): Post Debridement Measurements of Total Wound Length: (cm) 0.6 Width: (cm) 0.5 Depth: (cm) 0.2 Volume: (cm) 0.047 Character of Wound/Ulcer Post Debridement: Improved Severity of Tissue Post Debridement: Fat layer exposed Post Procedure Diagnosis Same as Pre-procedure Electronic Signature(s) Signed: 01/02/2020 4:56:10 PM By: Montey Hora Signed: 01/03/2020 6:44:22 PM By: Worthy Keeler PA-C Entered By: Montey Hora on 01/02/2020 13:02:53 [name], [name] ([id number]) -------------------------------------------------------------------------------- HPI Details Patient Name: [name] Date of Service: 01/02/2020 12:45 PM Medical Record Number: [id number] Patient Account Number: [id number] Date of Birth/Sex: [birth year] (74 y.o. M) Treating RN: Montey Hora Primary Care Provider: Frazier Richards Other Clinician: Referring Provider: Frazier Richards Treating Provider/Extender: Melburn Hake, Ramisa Duman Weeks in Treatment: 5 History of Present Illness HPI Description: 12/12/18 on evaluation today patient presents as a referral from his endocrinologist regarding issues that he has been having with his left great toe. Subsequently he has been seeing Dr. Cleda Mccreedy and Dr. Cleda Mccreedy has been the breeding the wound and in fact this was debrided this morning. The patient had an appointment there prior to coming here. Subsequently he states that even though he's been going for regular debridement after office things really do not seem to be showing signs of improvement unfortunately. He states that he is having some discomfort but nothing too significant. No fevers, chills, nausea, or vomiting noted at this time. The patient does have a history of hypertension, neuropathy, diabetes mellitus type II, and a history of stroke. Currently he's been using bacitracin on the toe  and utilizing a Band-Aid. He tells me he's had this wound for two years. He cannot remember the last time he had an x-ray. 12/19/18 on evaluation today patient appears to be doing well in regard to his plantar toe ulcer. He's been tolerating the dressing changes without complication. Fortunately there does not appear to be signs of infection at this time which is good news. No fever chills noted. 01/19/19 has been one month since I last  saw the patient as he was out of town. With that being said on evaluation today it does appear that he is going to require sharp debridement and I do believe that the total contact cast would benefit him as far as being applied at this time. He is in agreement that plan. 01/23/19 on evaluation today patient appears to be doing better in regard to his left great toe ulcer. He has been shown signs of improvement week by week and although this is slow it does seem to be doing fairly well which is good news. Overall very pleased in this regard. 01/31/19 on evaluation today patient presents for follow-up concerning his great toe ulcer. He was actually supposed to be here yesterday and he did come however he ended up having to leave due to his blood sugar dropping he states he just did not eat enough lunch. Fortunately seems to be doing much better today which is excellent news. Fortunately there's no evidence of infection at this time which is also good news. No fevers, chills, nausea, or vomiting noted at this time. Overall I feel like he is making excellent progress. 02/06/19 on evaluation today patient actually appears to be doing very well in regard to his plantar toe ulcer. He's been tolerating the dressing changes without complication. Fortunately there is no sign of infection at this time. Overall been very pleased with how things seem to be progressing. No fevers, chills, nausea, or vomiting noted at this time. 02/13/19 on evaluation today patient actually appears to  be doing excellent in regard to his toe ulcer which is almost completely close. Overall very pleased with that. There does not appear to be any signs of infection which is good news. Upon close inspection it appears that he has just a very slight opening still noted at the central portion of the toe although this is minimal. 02/16/19 on evaluation today patient is seen for early follow-up due to the fact that he tells me while at home he actually got up to go answer the door without putting on the walking boot part of his cast and subsequently damaged the total contact cast. It therefore comes in to have this removed and potentially replaced. Fortunately other than it given him a little bit of pain its far as pushing in on some areas there doesn't appear to be any injury once the cast was removed. 03/13/19 on evaluation today patient unfortunately presents for follow-up concerning his great toe ulcer on the left. He was discharged on 02/16/19 with the wound healed at that point. He tells me this remain so for several weeks or least a couple weeks before reopening. Fortunately there does not appear to be any signs of infection at this time. Nonetheless for the past week at least he's had the wound reopened and states it is been trying to keep pressure off of this but he has not been using the offloading shoe we previously gave him. Fortunately there's no evidence of infection at this time. No fevers, chills, nausea, or vomiting noted at this time. [name], [name] ([id number]) 03/17/19 on evaluation today patient actually appears to be doing rather well in regard to his toe also. Fact this appears to be significantly smaller this week even compared to what I saw last week on evaluation. He seems to have done very well for the offloading shoe and overall I'm very pleased with the progress that is made. It may be that he doesn't even need the cast to  get this to heal if he offloads this  appropriately. 03/24/19 on evaluation today patient actually appears to be doing well in regard to his plantar foot ulcer. He's been tolerating the dressing changes without complication the collagen does seem to be beneficial for him. With that being said he is gonna require some miles sharp debridement today to clear away some of the callous's around the edge of the wound as well as the minimal Slough noted on the surface of the wound. 03/31/19 on evaluation today patient appears to be doing well in regard to his plantar great toe ulcer. He has been tolerating the dressing changes without complication which is good news. Fortunately there does not appear to be any signs of active infection at this time. Overall very pleased with how things seem to be progressing. 04/11/19 on evaluation today patient's tools are appears to be doing in my pinion roughly about the same as were things that previous. Fortunately there does not appear to be signs of active infection at this time which is good news. With that being said he has not been experiencing any pain at this time which is good news there is also No fevers, chills, nausea, or vomiting noted at this time. 04/18/19 on evaluation today patient's wound actually appears to be doing fairly well today he did not even really have a lot of callous buildup. We have been debating on whether or not to reinitiate total contact cast. With that being said he seems to be doing fairly well this point with offloading in my pinion and again I think we need to come up with a way to get this healed that will also be sustainable for keeping it close. Again we were able to close it with the cast previous but again he has to be able to keep it such. For that reason we are working on trying to get him diabetic shoes he's waiting for the [place] to get in touch with the local company that has to measure him for these do not want to have a cast on at such a time as when they need  to actually measure him. 04/25/19 on evaluation today patient appears to be doing well in regard to his plantar foot specifically great toe ulcer. He's been tolerating the dressing changes without complication. Fortunately there's no signs of active infection at this time. Overall I'm very pleased with how things appear currently. 05/02/19 on evaluation today patient appears to actually be doing better in regard to his plantar foot ulcer. He shown signs of good improvement which is excellent news. She states he can walk better with the cast on the can with the offloading shoe that he had on previous. Fortunately there's no signs of active infection at this time. No fevers, chills, nausea, or vomiting noted at this time. 05/09/19 on evaluation today patient actually appears to be doing excellent. In fact he appears to be completely healed based on evaluation at this time. Fortunately there's no signs of infection and is having no discomfort. 06/08/19 this is a follow-up visit although the patient has been healed for almost a month but not quite. He tells me that in the past several days he noted some blood coming from his toe and he thought he should come in at this checked out. Fortunately he's having a pain. He also did get his custom orthotics shoes. With that being said he still appears to have developed this ulceration there is something he tells me that  the orthotic specialist stated they could do to the toes to try to help out in this regard although he did not know exactly what it was we may need to check with [facility]. 06/12/19 on evaluation today patient actually appears to be doing great in regard to his foot ulcer. The wound on his toes shown signs of excellent improvement which is great news. With that being said he is not completely close yet the cast has done wonders for him and he is definitely headed in the correct direction. No fevers, chills, nausea, or vomiting noted at  this time. 06/22/19 on evaluation today fortunately patient actually appears to be healing quite well and in fact appears to be completely healed this is excellent news. Fortunately there's no signs of active infection he's been tolerating the total contact cast without complication. Overall I'm pleased with the progress that has been made. He also has his new custom shoes he brought was with him today. 07/04/19 on evaluation today patient actually appears to be doing a little bit worse in regard his toe ulcer unfortunately this has yet again reopened. It has only been about a week and a half since I've last seen him I really did not expect this to reopen this quickly. Nonetheless I did discuss with him his normal routine in order to see what may be causing this. He has custom shoes. With that being said he apparently has not been using the custom shoes and even when he has used them he has not [name], [name]. ([id number]) even put the insert in there which is what was custom molded to him in order to appropriately offload high-pressure point areas. In fact his exact question to me was whether or not he needed to actually put those in they just came loose in the box and he had never installed them. Nonetheless it also came to light the he's been walking around in his bedroom slippers at home he tells me he does not go barefoot but he really has not been wearing his custom offloading/diabetic shoes. Nonetheless I do believe that this is why he continually reopens as far as the wound is concerned. I discussed this with the patient in great detail today for yet again although I previously had this discussion with him in the past. 07/11/19-Patient returns at 1 week for his left great toe wound which is actually doing really well, he has been using the insert to offload the toe and is very pleased with it. This looks like this is working out really well for him 07/18/19 on evaluation today patient  appears to be doing really about the same with regard to his ulcer on the right great toe. He's been tolerating the dressing changes without complication. Fortunately there's no signs of active infection at this time. With that being said I think that we're having a difficult time getting this to heal and I do believe that initiating treatment with the total contact cast to get this to close would be a good idea. The good news is he seems to be keeping this from worsening so hopefully that means that if we get it closed he can prevent this from reopening in the future. At this point my suggestion was that we go ahead and initiate treatment at both sites utilizing collagen. We will then subsequently put a piece of silver out and it between the toes of the right foot in the webbing between the fourth and fifth toe in order  to help keep things dry and allow this to hopefully close and we heal without complication. The patient is in agreement with plan. Subsequently I am hopeful that both wounds will heal quite quickly we have been very close now with the ankle and again the toe has reopened but fortunately doesn't appear to be to bad at this point. No antibiotics were prescribed today as they did not appear to be necessary. 07/25/19 on evaluation today patient actually appears to be doing excellent in regard to his great toe ulcer. In fact this really appears to be almost completely healed if indeed it is not in fact healed. With that being said there is still the eighth small pinpoint area that could be open although I tend to doubt it. Nonetheless it is something that I would like to continue to watch for more week I do think it would be beneficial for [place] to go ahead and continue with the cast for one more week before deciding to discharge him especially in light of the issues he's had in the past. Specifically with regard to the wound reopening rather quickly. 08/01/2019 upon evaluation today patient  actually appears to be doing well with regard to his great toe ulcer. He has been tolerating the dressing changes without complication. Fortunately we think appears to be completely healed at this time which is great news. I am very pleased with the progress up to this point. In fact he appears to be completely healed at this point. Readmission: 09/15/2019 patient presents today for reevaluation here in our clinic concerning issues that he is having with his left great toe as he did previous. We have not actually seen him since August 01, 2019. He had been doing very well until he noticed when walking into the bathroom with just the socks on the other night that he had a little bit of bleeding from the toe location. Subsequently he decided to make an appointment to come in as he did not want this to get significantly worse. There does not appear to be any signs of active infection he tells me as long as he was wearing his shoes he seemed to be doing okay is walking around not necessarily barefoot but just with socks on that I think may be his main problem at this time. 09/22/2019 on evaluation today patient actually appears to be doing quite well with regard to his great toe ulcer. This is making signs of good improvement even in the short amount of time we have been taking care of his toe since he came back. Is just been 1 week. Nonetheless the improvement in the wound size is excellent he also really has no depth and no evidence of infection which is great news. 09/29/2019 on evaluation today patient appears to be doing well with regard to his toe ulcer. This is showing signs of improvement in fact it was questioning whether or not today this actually was healed. However there did appear to be some callus covering the surface of the wound was removed there did appear to be a small pinpoint opening still in the middle of the wound although this is minimal. 10/06/2019 on evaluation today patient appears  to be doing excellent in regard to his toe ulcer which actually showed signs of complete Epithelization today. There is no evidence of active infection which is good news. Overall he has been tolerating the cast without complication but I think he is done with that as of now. Readmission: 11/28/2019  on evaluation today patient appears to be unfortunately here today again for another issue with his left great toe. He has been having some significant problems intermittently over the past year or really that I been seeing him. Were able to heal him and then subsequently things will get worse yet again. With that being said the patient tells me at this time that he [name], [name]. ([id number]) began to have issues with drainage noted 2 weeks ago. He has no pain but again he is never really had any discomfort. No fevers, chills, nausea, vomiting, or diarrhea. He has been using his offloading shoes he tells me. 12/05/19 on evaluation today patient actually appears to be doing quite well with regard to the wound on his toe. He really hasn't been using the Pegasus offloading shoe for now. He tells me he actually forgot. With that being said there does not appear to be any signs of active infection at this time which is good news. No fevers, chills, nausea, or vomiting noted at this time. 12/12/2019 on evaluation today the patient appears to be doing quite well with regard to his toe ulcer. This does not seem to be showing any signs of worsening he has been using his custom shoes and that seems to be beneficial for him. Fortunately there is no sign of active infection at this point. No fevers, chills, nausea, vomiting, or diarrhea. 12/19/2019 on evaluation today patient appears to be doing well with regard to his toe ulcer. He has been tolerating dressing changes without complication he is wearing his offloading shoes and I do feel like they are helping at this point. Fortunately there is no signs of  active infection at this time. 12/29-Patient comes in at 1 week visit with regards to his left great toe plantar ulcer, this has been improving, his continue to wear his offloading shoes which are definitely helping he said he noted a very small trace of discharge at 1 point however wound looks stable 01/02/2020 on evaluation today patient appears to be doing well with regard to his toe ulcer as far as things at least not getting any worse than what they have been up to this point. With that being said he continues to have some significant callus buildup however which I think is preventing him from being able to heal due to the friction and pressure getting to this area. Fortunately there is no evidence of active infection at this point which is good news. No fevers, chills, nausea, vomiting, or diarrhea. The patient has no lower extremity edema. Electronic Signature(s) Signed: 01/02/2020 1:07:26 PM By: Worthy Keeler PA-C Entered By: Worthy Keeler on 01/02/2020 13:07:26 [place], [name] ([id number]) -------------------------------------------------------------------------------- Physical Exam Details Patient Name: [name] Date of Service: 01/02/2020 12:45 PM Medical Record Number: [id number] Patient Account Number: [id number] Date of Birth/Sex: [birth year] (74 y.o. M) Treating RN: Montey Hora Primary Care Provider: Frazier Richards Other Clinician: Referring Provider: Frazier Richards Treating Provider/Extender: STONE III, Paiten Boies Weeks in Treatment: 5 Constitutional Well-nourished and well-hydrated in no acute distress. Respiratory normal breathing without difficulty. Psychiatric this patient is able to make decisions and demonstrates good insight into disease process. Alert and Oriented x 3. pleasant and cooperative. Notes Upon inspection patient's wound bed actually showed signs of callus buildup around the edges of the wound which did require sharp  debridement today. I did perform debridement to clear away the callus as well as some slough and biofilm from the surface of the wound which  the patient tolerated today without complication and post debridement the wound bed appears to be doing significantly better which is great news. Electronic Signature(s) Signed: 01/02/2020 1:07:54 PM By: Worthy Keeler PA-C Entered By: Worthy Keeler on 01/02/2020 13:07:53 [place], [name] ([id number]) -------------------------------------------------------------------------------- Physician Orders Details Patient Name: [name] Date of Service: 01/02/2020 12:45 PM Medical Record Number: [id number] Patient Account Number: [id number] Date of Birth/Sex: March 29, 1946 (74 y.o. M) Treating RN: Montey Hora Primary Care Provider: Frazier Richards Other Clinician: Referring Provider: Frazier Richards Treating Provider/Extender: Melburn Hake, Priyal Musquiz Weeks in Treatment: 5 Verbal / Phone Orders: No Diagnosis Coding ICD-10 Coding Code Description E11.621 Type 2 diabetes mellitus with foot ulcer L97.522 Non-pressure chronic ulcer of other part of left foot with fat layer exposed I10 Essential (primary) hypertension E11.43 Type 2 diabetes mellitus with diabetic autonomic (poly)neuropathy Wound Cleansing Wound #1 Left Toe Great o May Shower, gently pat wound dry prior to applying new dressing. Anesthetic (add to Medication List) Wound #1 Left Toe Great o Topical Lidocaine 4% cream applied to wound bed prior to debridement (In Clinic Only). Primary Wound Dressing Wound #1 Left Toe Great o Silver Collagen Secondary Dressing Wound #1 Left Toe Great o Conform/Kerlix o Foam Dressing Change Frequency Wound #1 Left Toe Great o Change dressing every other day. Follow-up Appointments o Return Appointment in 1 week. Off-Loading Wound #1 Left Toe Great o Open toe surgical shoe with peg assist. Electronic  Signature(s) Signed: 01/02/2020 4:56:10 PM By: Montey Hora Signed: 01/03/2020 6:44:22 PM By: Worthy Keeler PA-C Entered By: Montey Hora on 01/02/2020 13:03:18 [place], [name] ([id number]) DREGAN, PALUBICKI ([id number]) -------------------------------------------------------------------------------- Problem List Details Patient Name: [name], [name] Date of Service: 01/02/2020 12:45 PM Medical Record Number: [id number] Patient Account Number: [id number] Date of Birth/Sex: [birth year] (74 y.o. 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 M) Treating RN: Montey Hora Primary Care Provider: Frazier Richards Other Clinician: Referring Provider: Frazier Richards Treating Provider/Extender: Melburn Hake, Trevar Boehringer Weeks in Treatment: 5 Subjective Chief Complaint Information obtained from Patient Left great toe ulcer History of Present Illness  (HPI) 12/12/18 on evaluation today patient presents as a referral from his endocrinologist regarding issues that he has been having with his left great toe. Subsequently he has been seeing Dr. Cleda Mccreedy and Dr. Cleda Mccreedy has been the breeding the wound and in fact this was debrided this morning. The patient had an appointment there prior to coming here. Subsequently he states that even though he's been going for regular debridement after office things really do not seem to be showing signs of improvement unfortunately. He states that he is having some discomfort but nothing too significant. No fevers, chills, nausea, or vomiting noted at this time. The patient  does have a history of hypertension, neuropathy, diabetes mellitus type II, and a history of stroke. Currently he's been using bacitracin on the toe and utilizing a Band-Aid. He tells me he's had this wound for two years. He cannot remember the last time he had an x-ray. 12/19/18 on evaluation today patient appears to be doing well in regard to his plantar toe ulcer. He's been tolerating the dressing changes without complication. Fortunately there does not appear to be signs of infection at this time which is good news. No fever chills noted. 01/19/19 has been one month since I last saw the patient as he was out of town. With that being said on evaluation today it does appear that he is going to require sharp debridement and I do believe that the total contact cast would benefit him as far as being applied at this time. He is in agreement that plan. 01/23/19 on evaluation today patient appears to be doing better in regard to his left great toe ulcer. He has been shown signs of improvement week by week and although this is slow it does seem to be doing fairly well which is good news. Overall very pleased in this regard. 01/31/19 on evaluation today patient presents for follow-up concerning his great toe ulcer. He was actually supposed to be  here yesterday and he did come however he ended up having to leave due to his blood sugar dropping he states he just did not eat enough lunch. Fortunately seems to be doing much better today which is excellent news. Fortunately there's no evidence of infection at this time which is also good news. No fevers, chills, nausea, or vomiting noted at this time. Overall I feel like he is making excellent progress. 02/06/19 on evaluation today patient actually appears to be doing very well in regard to his plantar toe ulcer. He's been tolerating the dressing changes without complication. Fortunately there is no sign of infection at this time. Overall been very pleased with how things seem to be progressing. No fevers, chills, nausea, or vomiting noted at this time. 02/13/19 on evaluation today patient actually appears to be doing excellent in regard to his toe ulcer which is almost completely close. Overall very pleased with that. There does not appear to be any signs of infection which is good news. Upon close inspection it appears that he has just a very slight opening still noted at the central portion of the toe although this is minimal. 02/16/19 on evaluation today patient is seen for early follow-up due to the fact that he tells me while at home he actually got up to go answer the door without putting on the walking boot part of his cast and subsequently damaged the total contact cast. It therefore comes in to have this removed and potentially replaced. Fortunately other than it given him a little bit of pain its far as pushing in on some areas there doesn't appear to be any injury once the cast was removed. [name], [name] ([id number]) 03/13/19 on evaluation today patient unfortunately presents for follow-up concerning his great toe ulcer on the left. He was discharged on 02/16/19 with the wound healed at that point. He tells me this remain so for several weeks or least a couple weeks before  reopening. Fortunately there does not appear to be any signs of infection at this time. Nonetheless for the past week at least he's had the wound reopened and states it is been trying to keep pressure off of  this but he has not been using the offloading shoe we previously gave him. Fortunately there's no evidence of infection at this time. No fevers, chills, nausea, or vomiting noted at this time. 03/17/19 on evaluation today patient actually appears to be doing rather well in regard to his toe also. Fact this appears to be significantly smaller this week even compared to what I saw last week on evaluation. He seems to have done very well for the offloading shoe and overall I'm very pleased with the progress that is made. It may be that he doesn't even need the cast to get this to heal if he offloads this appropriately. 03/24/19 on evaluation today patient actually appears to be doing well in regard to his plantar foot ulcer. He's been tolerating the dressing changes without complication the collagen does seem to be beneficial for him. With that being said he is gonna require some miles sharp debridement today to clear away some of the callous's around the edge of the wound as well as the minimal Slough noted on the surface of the wound. 03/31/19 on evaluation today patient appears to be doing well in regard to his plantar great toe ulcer. He has been tolerating the dressing changes without complication which is good news. Fortunately there does not appear to be any signs of active infection at this time. Overall very pleased with how things seem to be progressing. 04/11/19 on evaluation today patient's tools are appears to be doing in my pinion roughly about the same as were things that previous. Fortunately there does not appear to be signs of active infection at this time which is good news. With that being said he has not been experiencing any pain at this time which is good news there is also No  fevers, chills, nausea, or vomiting noted at this time. 04/18/19 on evaluation today patient's wound actually appears to be doing fairly well today he did not even really have a lot of callous buildup. We have been debating on whether or not to reinitiate total contact cast. With that being said he seems to be doing fairly well this point with offloading in my pinion and again I think we need to come up with a way to get this healed that will also be sustainable for keeping it close. Again we were able to close it with the cast previous but again he has to be able to keep it such. For that reason we are working on trying to get him diabetic shoes he's waiting for the [place] to get in touch with the local company that has to measure him for these do not want to have a cast on at such a time as when they need to actually measure him. 04/25/19 on evaluation today patient appears to be doing well in regard to his plantar foot specifically great toe ulcer. He's been tolerating the dressing changes without complication. Fortunately there's no signs of active infection at this time. Overall I'm very pleased with how things appear currently. 05/02/19 on evaluation today patient appears to actually be doing better in regard to his plantar foot ulcer. He shown signs of good improvement which is excellent news. She states he can walk better with the cast on the can with the offloading shoe that he had on previous. Fortunately there's no signs of active infection at this time. No fevers, chills, nausea, or vomiting noted at this time. 05/09/19 on evaluation today patient actually appears to be doing excellent. In fact  he appears to be completely healed based on evaluation at this time. Fortunately there's no signs of infection and is having no discomfort. 06/08/19 this is a follow-up visit although the patient has been healed for almost a month but not quite. He tells me that in the past several days he noted some  blood coming from his toe and he thought he should come in at this checked out. Fortunately he's having a pain. He also did get his custom orthotics shoes. With that being said he still appears to have developed this ulceration there is something he tells me that the orthotic specialist stated they could do to the toes to try to help out in this regard although he did not know exactly what it was we may need to check with [facility]. 06/12/19 on evaluation today patient actually appears to be doing great in regard to his foot ulcer. The wound on his toes shown signs of excellent improvement which is great news. With that being said he is not completely close yet the cast has done wonders for him and he is definitely headed in the correct direction. No fevers, chills, nausea, or vomiting noted at this time. 06/22/19 on evaluation today fortunately patient actually appears to be healing quite well and in fact appears to be completely healed this is excellent news. Fortunately there's no signs of active infection he's been tolerating the total contact cast without complication. Overall I'm pleased with the progress that has been made. He also has his new custom shoes he brought was [name], [name] ([id number]) with him today. 07/04/19 on evaluation today patient actually appears to be doing a little bit worse in regard his toe ulcer unfortunately this has yet again reopened. It has only been about a week and a half since I've last seen him I really did not expect this to reopen this quickly. Nonetheless I did discuss with him his normal routine in order to see what may be causing this. He has custom shoes. With that being said he apparently has not been using the custom shoes and even when he has used them he has not even put the insert in there which is what was custom molded to him in order to appropriately offload high-pressure point areas. In fact his exact question to me was whether or not  he needed to actually put those in they just came loose in the box and he had never installed them. Nonetheless it also came to light the he's been walking around in his bedroom slippers at home he tells me he does not go barefoot but he really has not been wearing his custom offloading/diabetic shoes. Nonetheless I do believe that this is why he continually reopens as far as the wound is concerned. I discussed this with the patient in great detail today for yet again although I previously had this discussion with him in the past. 07/11/19-Patient returns at 1 week for his left great toe wound which is actually doing really well, he has been using the insert to offload the toe and is very pleased with it. This looks like this is working out really well for him 07/18/19 on evaluation today patient appears to be doing really about the same with regard to his ulcer on the right great toe. He's been tolerating the dressing changes without complication. Fortunately there's no signs of active infection at this time. With that being said I think that we're having a difficult time getting this to  heal and I do believe that initiating treatment with the total contact cast to get this to close would be a good idea. The good news is he seems to be keeping this from worsening so hopefully that means that if we get it closed he can prevent this from reopening in the future. At this point my suggestion was that we go ahead and initiate treatment at both sites utilizing collagen. We will then subsequently put a piece of silver out and it between the toes of the right foot in the webbing between the fourth and fifth toe in order to help keep things dry and allow this to hopefully close and we heal without complication. The patient is in agreement with plan. Subsequently I am hopeful that both wounds will heal quite quickly we have been very close now with the ankle and again the toe has reopened but fortunately  doesn't appear to be to bad at this point. No antibiotics were prescribed today as they did not appear to be necessary. 07/25/19 on evaluation today patient actually appears to be doing excellent in regard to his great toe ulcer. In fact this really appears to be almost completely healed if indeed it is not in fact healed. With that being said there is still the eighth small pinpoint area that could be open although I tend to doubt it. Nonetheless it is something that I would like to continue to watch for more week I do think it would be beneficial for [place] to go ahead and continue with the cast for one more week before deciding to discharge him especially in light of the issues he's had in the past. Specifically with regard to the wound reopening rather quickly. 08/01/2019 upon evaluation today patient actually appears to be doing well with regard to his great toe ulcer. He has been tolerating the dressing changes without complication. Fortunately we think appears to be completely healed at this time which is great news. I am very pleased with the progress up to this point. In fact he appears to be completely healed at this point. Readmission: 09/15/2019 patient presents today for reevaluation here in our clinic concerning issues that he is having with his left great toe as he did previous. We have not actually seen him since August 01, 2019. He had been doing very well until he noticed when walking into the bathroom with just the socks on the other night that he had a little bit of bleeding from the toe location. Subsequently he decided to make an appointment to come in as he did not want this to get significantly worse. There does not appear to be any signs of active infection he tells me as long as he was wearing his shoes he seemed to be doing okay is walking around not necessarily barefoot but just with socks on that I think may be his main problem at this time. 09/22/2019 on evaluation today patient  actually appears to be doing quite well with regard to his great toe ulcer. This is making signs of good improvement even in the short amount of time we have been taking care of his toe since he came back. Is just been 1 week. Nonetheless the improvement in the wound size is excellent he also really has no depth and no evidence of infection which is great news. 09/29/2019 on evaluation today patient appears to be doing well with regard to his toe ulcer. This is showing signs of improvement in fact it was  questioning whether or not today this actually was healed. However there did appear to be some callus covering the surface of the wound was removed there did appear to be a small pinpoint opening still in the middle of the wound although this is minimal. 10/06/2019 on evaluation today patient appears to be doing excellent in regard to his toe ulcer which actually showed signs of complete Epithelization today. There is no evidence of active infection which is good news. Overall he has been tolerating the cast without complication but I think he is done with that as of now. [name], [name] ([id number]) Readmission: 11/28/2019 on evaluation today patient appears to be unfortunately here today again for another issue with his left great toe. He has been having some significant problems intermittently over the past year or really that I been seeing him. Were able to heal him and then subsequently things will get worse yet again. With that being said the patient tells me at this time that he began to have issues with drainage noted 2 weeks ago. He has no pain but again he is never really had any discomfort. No fevers, chills, nausea, vomiting, or diarrhea. He has been using his offloading shoes he tells me. 12/05/19 on evaluation today patient actually appears to be doing quite well with regard to the wound on his toe. He really hasn't been using the Pegasus offloading shoe for now. He tells me he  actually forgot. With that being said there does not appear to be any signs of active infection at this time which is good news. No fevers, chills, nausea, or vomiting noted at this time. 12/12/2019 on evaluation today the patient appears to be doing quite well with regard to his toe ulcer. This does not seem to be showing any signs of worsening he has been using his custom shoes and that seems to be beneficial for him. Fortunately there is no sign of active infection at this point. No fevers, chills, nausea, vomiting, or diarrhea. 12/19/2019 on evaluation today patient appears to be doing well with regard to his toe ulcer. He has been tolerating dressing changes without complication he is wearing his offloading shoes and I do feel like they are helping at this point. Fortunately there is no signs of active infection at this time. 12/29-Patient comes in at 1 week visit with regards to his left great toe plantar ulcer, this has been improving, his continue to wear his offloading shoes which are definitely helping he said he noted a very small trace of discharge at 1 point however wound looks stable 01/02/2020 on evaluation today patient appears to be doing well with regard to his toe ulcer as far as things at least not getting any worse than what they have been up to this point. With that being said he continues to have some significant callus buildup however which I think is preventing him from being able to heal due to the friction and pressure getting to this area. Fortunately there is no evidence of active infection at this point which is good news. No fevers, chills, nausea, vomiting, or diarrhea. The patient has no lower extremity edema. Objective Constitutional Well-nourished and well-hydrated in no acute distress. Vitals Time Taken: 12:46 PM, Height: 72 in, Weight: 254 lbs, BMI: 34.4, Temperature: 98.5 F, Pulse: 71 bpm, Respiratory Rate: 18 breaths/min, Blood Pressure: 161/83  mmHg. Respiratory normal breathing without difficulty. Psychiatric this patient is able to make decisions and demonstrates good insight into disease process. Alert and  Oriented x 3. pleasant and cooperative. General Notes: Upon inspection patient's wound bed actually showed signs of callus buildup around the edges of the wound which did require sharp debridement today. I did perform debridement to clear away the callus as well as some slough and [name], [name]. ([id number]) biofilm from the surface of the wound which the patient tolerated today without complication and post debridement the wound bed appears to be doing significantly better which is great news. Integumentary (Hair, Skin) Wound #1 status is Open. Original cause of wound was Gradually Appeared. The wound is located on the Left Toe Great. The wound measures 0.3cm length x 0.2cm width x 0.3cm depth; 0.047cm^2 area and 0.014cm^3 volume. There is Fat Layer (Subcutaneous Tissue) Exposed exposed. There is no tunneling or undermining noted. There is a medium amount of serosanguineous drainage noted. The wound margin is flat and intact. There is medium (34-66%) granulation within the wound bed. There is a medium (34-66%) amount of necrotic tissue within the wound bed including Eschar and Adherent Slough. Assessment Active Problems ICD-10 Type 2 diabetes mellitus with foot ulcer Non-pressure chronic ulcer of other part of left foot with fat layer exposed Essential (primary) hypertension Type 2 diabetes mellitus with diabetic autonomic (poly)neuropathy Procedures Wound #1 Pre-procedure diagnosis of Wound #1 is a Diabetic Wound/Ulcer of the Lower Extremity located on the Left Toe Great .Severity of Tissue Pre Debridement is: Fat layer exposed. There was a Excisional Skin/Subcutaneous Tissue Debridement with a total area of 0.06 sq cm performed by STONE III, Ainara Eldridge E., PA-C. With the following instrument(s): Curette to remove  Viable and Non-Viable tissue/material. Material removed includes Callus, Subcutaneous Tissue, and Slough after achieving pain control using Lidocaine 4% Topical Solution. No specimens were taken. A time out was conducted at 12:59, prior to the start of the procedure. A Minimum amount of bleeding was controlled with Pressure. The procedure was tolerated well with a pain level of 0 throughout and a pain level of 0 following the procedure. Post Debridement Measurements: 0.6cm length x 0.5cm width x 0.2cm depth; 0.047cm^3 volume. Character of Wound/Ulcer Post Debridement is improved. Severity of Tissue Post Debridement is: Fat layer exposed. Post procedure Diagnosis Wound #1: Same as Pre-Procedure Plan Wound Cleansing: Wound #1 Left Toe Great: May Shower, gently pat wound dry prior to applying new dressing. Anesthetic (add to Medication List): Wound #1 Left Toe Great: Topical Lidocaine 4% cream applied to wound bed prior to debridement (In Clinic Only). Primary Wound Dressing: Wound #1 Left Toe GreatEDON, [name] ([id number]) Silver Collagen Secondary Dressing: Wound #1 Left Toe Great: Conform/Kerlix Foam Dressing Change Frequency: Wound #1 Left Toe Great: Change dressing every other day. Follow-up Appointments: Return Appointment in 1 week. Off-Loading: Wound #1 Left Toe Great: Open toe surgical shoe with peg assist. 1. At this point my suggestion for the ulcer is good to be that we continue with the silver collagen dressing I still think this is good to be the best for him. 2. I am also going to recommend that we go ahead and continue with the PEG assist offloading shoe I still think that is probably the best thing in this respect as far as trying to help with healing and appropriate offloading and less he is ready to start with the cast he wants to hold off another week at this point. 3. With regard to being at home when he is not wearing his offloading shoes I recommend  that he should be using some type of  memory foam type bedroom shoe or something to help keep pressure off of the toe as well I think this is of utmost importance. We will see patient back for reevaluation in 1 week here in the clinic. If anything worsens or changes patient will contact our office for additional recommendations. Electronic Signature(s) Signed: 01/02/2020 1:09:50 PM By: Worthy Keeler PA-C Entered By: Worthy Keeler on 01/02/2020 13:09:50 [place], [name] ([id number]) -------------------------------------------------------------------------------- SuperBill Details Patient Name: [name] Date of Service: 01/02/2020 Medical Record Number: [id number] Patient Account Number: [id number] Date of Birth/Sex: 10-09-1946 (74 y.o. M) Treating RN: Montey Hora Primary Care Provider: Frazier Richards Other Clinician: Referring Provider: Frazier Richards Treating Provider/Extender: Melburn Hake, Shaul Trautman Weeks in Treatment: 5 Diagnosis Coding ICD-10 Codes Code Description E11.621 Type 2 diabetes mellitus with foot ulcer L97.522 Non-pressure chronic ulcer of other part of left foot with fat layer exposed I10 Essential (primary) hypertension E11.43 Type 2 diabetes mellitus with diabetic autonomic (poly)neuropathy Facility Procedures CPT4 Code: [id number] Description: [id number] - DEB SUBQ TISSUE 20 SQ CM/< ICD-10 Diagnosis Description L97.522 Non-pressure chronic ulcer of other part of left foot with fat Modifier: layer exposed Quantity: 1 Physician Procedures CPT4 Code: [id number] Description: 11042 - WC PHYS SUBQ TISS 20 SQ CM ICD-10 Diagnosis Description L97.522 Non-pressure chronic ulcer of other part of left foot with fat Modifier: layer exposed Quantity: 1 Electronic Signature(s) Signed: 01/02/2020 1:13:03 PM By: Worthy Keeler PA-C Entered By: Worthy Keeler on 01/02/2020 13:13:03

## 2020-01-02 NOTE — Progress Notes (Signed)
AVENIR, HENNEY ([id number]) Visit Report for 01/02/2020 Arrival Information Details Patient Name: [name], [name]. Date of Service: 01/02/2020 12:45 PM Medical Record Number: [id number] Patient Account Number: [id number] Date of Birth/Sex: 1946-09-24 (74 y.o. M) Treating RN: Montey Hora Primary Care Laurine Kuyper: Frazier Richards Other Clinician: Referring Hadyn Blanck: Frazier Richards Treating Makenzye Troutman/Extender: Melburn Hake, HOYT Weeks in Treatment: 5 Visit Information History Since Last Visit Added or deleted any medications: No Patient Arrived: Ambulatory Any new allergies or adverse reactions: No Arrival Time: 12:47 Had a fall or experienced change in No Accompanied By: self activities of daily living that may affect Transfer Assistance: None risk of falls: Patient Identification Verified: Yes Signs or symptoms of abuse/neglect since last visito No Secondary Verification Process Completed: Yes Hospitalized since last visit: No Implantable device outside of the clinic excluding No cellular tissue based products placed in the center since last visit: Has Dressing in Place as Prescribed: Yes Pain Present Now: No Electronic Signature(s) Signed: 01/02/2020 3:01:49 PM By: Lorine Bears RCP, RRT, CHT Entered By: Lorine Bears on 01/02/2020 12:48:20 [place], [name] ([id number]) -------------------------------------------------------------------------------- Encounter Discharge Information Details Patient Name: [name]. Date of Service: 01/02/2020 12:45 PM Medical Record Number: [id number] Patient Account Number: [id number] Date of Birth/Sex: July 15, 1946 (74 y.o. M) Treating RN: Montey Hora Primary Care Kealey Kemmer: Frazier Richards Other Clinician: Referring Sabryna Lahm: Frazier Richards Treating Ivianna Notch/Extender: Melburn Hake, HOYT Weeks in Treatment: 5 Encounter Discharge Information Items Post Procedure Vitals Discharge  Condition: Stable Temperature (F): 98.5 Ambulatory Status: Ambulatory Pulse (bpm): 71 Discharge Destination: Home Respiratory Rate (breaths/min): 16 Transportation: Private Auto Blood Pressure (mmHg): 161/83 Accompanied By: self Schedule Follow-up Appointment: Yes Clinical Summary of Care: Electronic Signature(s) Signed: 01/02/2020 4:56:10 PM By: Montey Hora Entered By: Montey Hora on 01/02/2020 13:04:39 [place], [name] ([id number]) -------------------------------------------------------------------------------- Lower Extremity Assessment Details Patient Name: [name]. Date of Service: 01/02/2020 12:45 PM Medical Record Number: [id number] Patient Account Number: [id number] Date of Birth/Sex: February 20, 1946 (74 y.o. M) Treating RN: Army Melia Primary Care Garry Bochicchio: Frazier Richards Other Clinician: Referring Pinchos Topel: Frazier Richards Treating Saliou Barnier/Extender: STONE III, HOYT Weeks in Treatment: 5 Edema Assessment Assessed: [Left: No] [Right: No] Edema: [Left: N] [Right: o] Vascular Assessment Pulses: Dorsalis Pedis Palpable: [Right:Yes] Electronic Signature(s) Signed: 01/02/2020 4:03:57 PM By: Army Melia Entered By: Army Melia on 01/02/2020 12:53:02 [place], [name] ([id number]) -------------------------------------------------------------------------------- Multi Wound Chart Details Patient Name: [name]. Date of Service: 01/02/2020 12:45 PM Medical Record Number: [id number] Patient Account Number: [id number] Date of Birth/Sex: Jul 12, 1946 (74 y.o. M) Treating RN: Montey Hora Primary Care Angla Delahunt: Frazier Richards Other Clinician: Referring Keedan Sample: Frazier Richards Treating Massiel Stipp/Extender: Melburn Hake, HOYT Weeks in Treatment: 5 Vital Signs Height(in): 72 Pulse(bpm): 38 Weight(lbs): 254 Blood Pressure(mmHg): 161/83 Body Mass Index(BMI): 34 Temperature(F): 98.5 Respiratory  Rate 18 (breaths/min): Photos: [N/A:N/A] Wound Location: Left Toe Great N/A N/A Wounding Event: Gradually Appeared N/A N/A Primary Etiology: Diabetic Wound/Ulcer of the N/A N/A Lower Extremity Comorbid History: Hypertension, Type II Diabetes N/A N/A Date Acquired: 11/14/2019 N/A N/A Weeks of Treatment: 5 N/A N/A Wound Status: Open N/A N/A Measurements L x W x D 0.3x0.2x0.3 N/A N/A (cm) Area (cm) : 0.047 N/A N/A Volume (cm) : 0.014 N/A N/A % Reduction in Area: 25.40% N/A N/A % Reduction in Volume: 63.20% N/A N/A Classification: Grade 1 N/A N/A Exudate Amount: Medium N/A N/A Exudate Type: Serosanguineous N/A N/A Exudate Color: red, brown N/A N/A Wound Margin: Flat and Intact N/A N/A Granulation Amount: Medium (34-66%) N/A N/A Necrotic Amount:  Medium (34-66%) N/A N/A Necrotic Tissue: Eschar, Adherent Slough N/A N/A Exposed Structures: Fat Layer (Subcutaneous N/A N/A Tissue) Exposed: Yes Fascia: No Tendon: No Muscle: No Joint: No Bone: No KERI, HOELZLE ([id number]) Epithelialization: None N/A N/A Treatment Notes Electronic Signature(s) Signed: 01/02/2020 4:56:10 PM By: Montey Hora Entered By: Montey Hora on 01/02/2020 12:59:08 [name] ([id number]) -------------------------------------------------------------------------------- Dresser Details Patient Name: [name]. Date of Service: 01/02/2020 12:45 PM Medical Record Number: [id number] Patient Account Number: [id number] Date of Birth/Sex: 07-24-1946 (74 y.o. M) Treating RN: Montey Hora Primary Care Violett Hobbs: Frazier Richards Other Clinician: Referring Jagger Beahm: Frazier Richards Treating Duana Benedict/Extender: Melburn Hake, HOYT Weeks in Treatment: 5 Active Inactive Abuse / Safety / Falls / Self Care Management Nursing Diagnoses: Potential for falls Goals: Patient will not experience any injury related to falls Date Initiated: 11/28/2019 Target Resolution  Date: 03/09/2020 Goal Status: Active Interventions: Assess fall risk on admission and as needed Notes: Necrotic Tissue Nursing Diagnoses: Impaired tissue integrity related to necrotic/devitalized tissue Goals: Necrotic/devitalized tissue will be minimized in the wound bed Date Initiated: 11/28/2019 Target Resolution Date: 03/09/2020 Goal Status: Active Interventions: Provide education on necrotic tissue and debridement process Notes: Nutrition Nursing Diagnoses: Potential for alteratiion in Nutrition/Potential for imbalanced nutrition Goals: Patient/caregiver agrees to and verbalizes understanding of need to use nutritional supplements and/or vitamins as prescribed Date Initiated: 11/28/2019 Target Resolution Date: 03/09/2020 Goal Status: Active Interventions: Assess patient nutrition upon admission and as needed per policy URIYAH, ETUE ([id number]) Notes: Orientation to the Wound Care Program Nursing Diagnoses: Knowledge deficit related to the wound healing center program Goals: Patient/caregiver will verbalize understanding of the [facility] Program Date Initiated: 11/28/2019 Target Resolution Date: 03/09/2020 Goal Status: Active Interventions: Provide education on orientation to the wound center Notes: Wound/Skin Impairment Nursing Diagnoses: Impaired tissue integrity Goals: Ulcer/skin breakdown will heal within 14 weeks Date Initiated: 11/28/2019 Target Resolution Date: 03/09/2020 Goal Status: Active Interventions: Assess patient/caregiver ability to obtain necessary supplies Assess patient/caregiver ability to perform ulcer/skin care regimen upon admission and as needed Assess ulceration(s) every visit Notes: Electronic Signature(s) Signed: 01/02/2020 4:56:10 PM By: Montey Hora Entered By: Montey Hora on 01/02/2020 12:58:57 [place], [name]  ([id number]) -------------------------------------------------------------------------------- Pain Assessment Details Patient Name: [name]. Date of Service: 01/02/2020 12:45 PM Medical Record Number: [id number] Patient Account Number: [id number] Date of Birth/Sex: [birth year] (74 y.o. M) Treating RN: Montey Hora Primary Care Cabela Pacifico: Frazier Richards Other Clinician: Referring Letzy Gullickson: Frazier Richards Treating Blondina Coderre/Extender: Melburn Hake, HOYT Weeks in Treatment: 5 Active Problems Location of Pain Severity and Description of Pain Patient Has Paino No Site Locations Pain Management and Medication Current Pain Management: Electronic Signature(s) Signed: 01/02/2020 3:01:49 PM By: Paulla Fore, RRT, CHT Signed: 01/02/2020 4:56:10 PM By: Montey Hora Entered By: Lorine Bears on 01/02/2020 12:48:28 [name] ([id number]) -------------------------------------------------------------------------------- Patient/Caregiver Education Details Patient Name: [name] Date of Service: 01/02/2020 12:45 PM Medical Record Number: [id number] Patient Account Number: [id number] Date of Birth/Gender: [birth year] (74 y.o. M) Treating RN: Montey Hora Primary Care Physician: Frazier Richards Other Clinician: Referring Physician: Frazier Richards Treating Physician/Extender: Melburn Hake, HOYT Weeks in Treatment: 5 Education Assessment Education Provided To: Patient Education Topics Provided Safety: Handouts: Other: do not walk without shoes Methods: Explain/Verbal Responses: State content correctly Wound/Skin Impairment: Handouts: Other: wound care as ordered Methods: Demonstration, Explain/Verbal Responses: State content correctly Electronic Signature(s) Signed: 01/02/2020 4:56:10 PM By: Montey Hora Entered By: Montey Hora on 01/02/2020 13:03:59 [place], [name]  ([id number]) --------------------------------------------------------------------------------  Wound Assessment Details Patient Name: CLEAVEN, REUTHER. Date of Service: 01/02/2020 12:45 PM Medical Record Number: [id number] Patient Account Number: [id number] Date of Birth/Sex: 07-12-1946 (74 y.o. M) Treating RN: Army Melia Primary Care Tagg Eustice: Frazier Richards Other Clinician: Referring Lindsy Cerullo: Frazier Richards Treating Lemar Bakos/Extender: Melburn Hake, HOYT Weeks in Treatment: 5 Wound Status Wound Number: 1 Primary Etiology: Diabetic Wound/Ulcer of the Lower Extremity Wound Location: Left Toe Great Wound Status: Open Wounding Event: Gradually Appeared Comorbid Hypertension, Type II Diabetes Date Acquired: 11/14/2019 History: Weeks Of Treatment: 5 Clustered Wound: No Photos Wound Measurements Length: (cm) 0.3 % Reduction i Width: (cm) 0.2 % Reduction i Depth: (cm) 0.3 Epithelializa Area: (cm) 0.047 Tunneling: Volume: (cm) 0.014 Undermining: n Area: 25.4% n Volume: 63.2% tion: None No No Wound Description Classification: Grade 1 Foul Odor Aft Wound Margin: Flat and Intact Slough/Fibrin Exudate Amount: Medium Exudate Type: Serosanguineous Exudate Color: red, brown er Cleansing: No o Yes Wound Bed Granulation Amount: Medium (34-66%) Exposed Structure Necrotic Amount: Medium (34-66%) Fascia Exposed: No Necrotic Quality: Eschar, Adherent Slough Fat Layer (Subcutaneous Tissue) Exposed: Yes Tendon Exposed: No Muscle Exposed: No Joint Exposed: No Bone Exposed: No Treatment Notes DREWEY, HOLEN ([id number]) Wound #1 (Left Toe Great) Notes prisma, foam and conform Electronic Signature(s) Signed: 01/02/2020 4:03:57 PM By: Army Melia Entered By: Army Melia on 01/02/2020 12:52:09 [name] ([id number]) -------------------------------------------------------------------------------- Vitals Details Patient Name: [name] Date of Service: 01/02/2020 12:45 PM Medical Record Number: [id number] Patient Account Number: [id number] Date of Birth/Sex: 11/02/1946 (74 y.o. M) Treating RN: Montey Hora Primary Care Addalynne Golding: Frazier Richards Other Clinician: Referring Waynetta Metheny: Frazier Richards Treating Neveyah Garzon/Extender: Melburn Hake, HOYT Weeks in Treatment: 5 Vital Signs Time Taken: 12:46 Temperature (F): 98.5 Height (in): 72 Pulse (bpm): 71 Weight (lbs): 254 Respiratory Rate (breaths/min): 18 Body Mass Index (BMI): 34.4 Blood Pressure (mmHg): 161/83 Reference Range: 80 - 120 mg / dl Electronic Signature(s) Signed: 01/02/2020 3:01:49 PM By: Lorine Bears RCP, RRT, CHT Entered By: Lorine Bears on 01/02/2020 12:49:32

## 2020-01-04 DIAGNOSIS — I6509 Occlusion and stenosis of unspecified vertebral artery: Secondary | ICD-10-CM | POA: Insufficient documentation

## 2020-01-04 DIAGNOSIS — F17200 Nicotine dependence, unspecified, uncomplicated: Secondary | ICD-10-CM | POA: Insufficient documentation

## 2020-01-04 DIAGNOSIS — M5430 Sciatica, unspecified side: Secondary | ICD-10-CM | POA: Insufficient documentation

## 2020-01-04 DIAGNOSIS — E1140 Type 2 diabetes mellitus with diabetic neuropathy, unspecified: Secondary | ICD-10-CM | POA: Insufficient documentation

## 2020-01-09 ENCOUNTER — Other Ambulatory Visit: Payer: Self-pay

## 2020-01-09 ENCOUNTER — Encounter: Payer: Medicare Other | Admitting: Physician Assistant

## 2020-01-09 DIAGNOSIS — E11621 Type 2 diabetes mellitus with foot ulcer: Secondary | ICD-10-CM | POA: Diagnosis not present

## 2020-01-09 NOTE — Progress Notes (Signed)
[name], [name] ([id number]) Visit Report for 01/09/2020 Arrival Information Details Patient Name: [name], [name]. Date of Service: 01/09/2020 1:30 PM Medical Record Number: [id number] Patient Account Number: [id number] Date of Birth/Sex: August 15, 1946 (73 y.o. M) Treating RN: Montey Hora Primary Care Texie Tupou: Frazier Richards Other Clinician: Referring Retina Bernardy: Frazier Richards Treating Chanti Golubski/Extender: Melburn Hake, HOYT Weeks in Treatment: 6 Visit Information History Since Last Visit Added or deleted any medications: No Patient Arrived: Cane Any new allergies or adverse reactions: No Arrival Time: 13:22 Had a fall or experienced change in No Accompanied By: self activities of daily living that may affect Transfer Assistance: None risk of falls: Patient Identification Verified: Yes Signs or symptoms of abuse/neglect since last visito No Secondary Verification Process Completed: Yes Hospitalized since last visit: No Implantable device outside of the clinic excluding No cellular tissue based products placed in the center since last visit: Has Dressing in Place as Prescribed: Yes Pain Present Now: No Electronic Signature(s) Signed: 01/09/2020 4:10:34 PM By: Lorine Bears RCP, RRT, CHT Entered By: Lorine Bears on 01/09/2020 13:25:27 [name], [name] ([id number]) -------------------------------------------------------------------------------- Clinic Level of Care Assessment Details Patient Name: [name] Date of Service: 01/09/2020 1:30 PM Medical Record Number: [id number] Patient Account Number: [id number] Date of Birth/Sex: 12-Nov-1946 (73 y.o. M) Treating RN: Montey Hora Primary Care Maxine Huynh: Frazier Richards Other Clinician: Referring Sakiya Stepka: Frazier Richards Treating Drena Ham/Extender: Melburn Hake, HOYT Weeks in Treatment: 6 Clinic Level of Care Assessment Items TOOL 4 Quantity Score []  - Use when  only an EandM is performed on FOLLOW-UP visit 0 ASSESSMENTS - Nursing Assessment / Reassessment X - Reassessment of Co-morbidities (includes updates in patient status) 1 10 X- 1 5 Reassessment of Adherence to Treatment Plan ASSESSMENTS - Wound and Skin Assessment / Reassessment X - Simple Wound Assessment / Reassessment - one wound 1 5 []  - 0 Complex Wound Assessment / Reassessment - multiple wounds []  - 0 Dermatologic / Skin Assessment (not related to wound area) ASSESSMENTS - Focused Assessment []  - Circumferential Edema Measurements - multi extremities 0 []  - 0 Nutritional Assessment / Counseling / Intervention X- 1 5 Lower Extremity Assessment (monofilament, tuning fork, pulses) []  - 0 Peripheral Arterial Disease Assessment (using hand held doppler) ASSESSMENTS - Ostomy and/or Continence Assessment and Care []  - Incontinence Assessment and Management 0 []  - 0 Ostomy Care Assessment and Management (repouching, etc.) PROCESS - Coordination of Care X - Simple Patient / Family Education for ongoing care 1 15 []  - 0 Complex (extensive) Patient / Family Education for ongoing care X- 1 10 Staff obtains Programmer, systems, Records, Test Results / Process Orders []  - 0 Staff telephones HHA, Nursing Homes / Clarify orders / etc []  - 0 Routine Transfer to another Facility (non-emergent condition) []  - 0 Routine Hospital Admission (non-emergent condition) []  - 0 New Admissions / Biomedical engineer / Ordering NPWT, Apligraf, etc. []  - 0 Emergency Hospital Admission (emergent condition) X- 1 10 Simple Discharge Coordination [name], [name] ([id number]) []  - 0 Complex (extensive) Discharge Coordination PROCESS - Special Needs []  - Pediatric / Minor Patient Management 0 []  - 0 Isolation Patient Management []  - 0 Hearing / Language / Visual special needs []  - 0 Assessment of Community assistance (transportation, D/C planning, etc.) []  - 0 Additional assistance / Altered  mentation []  - 0 Support Surface(s) Assessment (bed, cushion, seat, etc.) INTERVENTIONS - Wound Cleansing / Measurement X - Simple Wound Cleansing - one wound 1 5 []  - 0 Complex Wound Cleansing - multiple wounds  X- 1 5 Wound Imaging (photographs - any number of wounds) []  - 0 Wound Tracing (instead of photographs) X- 1 5 Simple Wound Measurement - one wound []  - 0 Complex Wound Measurement - multiple wounds INTERVENTIONS - Wound Dressings X - Small Wound Dressing one or multiple wounds 1 10 []  - 0 Medium Wound Dressing one or multiple wounds []  - 0 Large Wound Dressing one or multiple wounds []  - 0 Application of Medications - topical []  - 0 Application of Medications - injection INTERVENTIONS - Miscellaneous []  - External ear exam 0 []  - 0 Specimen Collection (cultures, biopsies, blood, body fluids, etc.) []  - 0 Specimen(s) / Culture(s) sent or taken to Lab for analysis []  - 0 Patient Transfer (multiple staff / Civil Service fast streamer / Similar devices) []  - 0 Simple Staple / Suture removal (25 or less) []  - 0 Complex Staple / Suture removal (26 or more) []  - 0 Hypo / Hyperglycemic Management (close monitor of Blood Glucose) []  - 0 Ankle / Brachial Index (ABI) - do not check if billed separately X- 1 5 Vital Signs [name], [name] ([id number]) Has the patient been seen at the hospital within the last three years: Yes Total Score: 90 Level Of Care: New/Established - Level 3 Electronic Signature(s) Signed: 01/09/2020 4:09:10 PM By: Montey Hora Entered By: Montey Hora on 01/09/2020 13:48:38 [name], [name] ([id number]) -------------------------------------------------------------------------------- Encounter Discharge Information Details Patient Name: [name]. Date of Service: 01/09/2020 1:30 PM Medical Record Number: [id number] Patient Account Number: [id number] Date of Birth/Sex: 08/10/1946 (73 y.o. M) Treating RN: Montey Hora Primary Care  Aldon Hengst: Frazier Richards Other Clinician: Referring Huriel Matt: Frazier Richards Treating Nycholas Rayner/Extender: Melburn Hake, HOYT Weeks in Treatment: 6 Encounter Discharge Information Items Discharge Condition: Stable Ambulatory Status: Ambulatory Discharge Destination: Home Transportation: Private Auto Accompanied By: self Schedule Follow-up Appointment: Yes Clinical Summary of Care: Electronic Signature(s) Signed: 01/09/2020 4:09:10 PM By: Montey Hora Entered By: Montey Hora on 01/09/2020 13:49:33 [name], [name] ([id number]) -------------------------------------------------------------------------------- Lower Extremity Assessment Details Patient Name: [name]. Date of Service: 01/09/2020 1:30 PM Medical Record Number: [id number] Patient Account Number: [id number] Date of Birth/Sex: [birth year] (73 y.o. M) Treating RN: Army Melia Primary Care Sundee Garland: Frazier Richards Other Clinician: Referring Riko Lumsden: Frazier Richards Treating Graydon Fofana/Extender: STONE III, HOYT Weeks in Treatment: 6 Edema Assessment Assessed: [Left: No] [Right: No] Edema: [Left: N] [Right: o] Vascular Assessment Pulses: Dorsalis Pedis Palpable: [Left:Yes] Electronic Signature(s) Signed: 01/09/2020 4:02:35 PM By: Army Melia Entered By: Army Melia on 01/09/2020 13:30:49 [name], [name] ([id number]) -------------------------------------------------------------------------------- Multi Wound Chart Details Patient Name: [name]. Date of Service: 01/09/2020 1:30 PM Medical Record Number: [id number] Patient Account Number: [id number] Date of Birth/Sex: [birth year] (73 y.o. M) Treating RN: Montey Hora Primary Care Matisyn Cabeza: Frazier Richards Other Clinician: Referring Master Touchet: Frazier Richards Treating Zephyra Bernardi/Extender: Melburn Hake, HOYT Weeks in Treatment: 6 Vital Signs Height(in): 72 Pulse(bpm): 81 Weight(lbs): 254 Blood Pressure(mmHg):  116/56 Body Mass Index(BMI): 34 Temperature(F): 98.6 Respiratory Rate 18 (breaths/min): Photos: [N/A:N/A] Wound Location: Left Toe Great N/A N/A Wounding Event: Gradually Appeared N/A N/A Primary Etiology: Diabetic Wound/Ulcer of the N/A N/A Lower Extremity Comorbid History: Hypertension, Type II Diabetes N/A N/A Date Acquired: 11/14/2019 N/A N/A Weeks of Treatment: 6 N/A N/A Wound Status: Open N/A N/A Measurements L x W x D 0.4x0.3x0.2 N/A N/A (cm) Area (cm) : 0.094 N/A N/A Volume (cm) : 0.019 N/A N/A % Reduction in Area: -49.20% N/A N/A % Reduction in Volume: 50.00% N/A N/A Classification: Grade 1 N/A N/A  Exudate Amount: Medium N/A N/A Exudate Type: Serosanguineous N/A N/A Exudate Color: red, brown N/A N/A Wound Margin: Flat and Intact N/A N/A Granulation Amount: Large (67-100%) N/A N/A Necrotic Amount: Small (1-33%) N/A N/A Necrotic Tissue: Eschar, Adherent Slough N/A N/A Exposed Structures: Fat Layer (Subcutaneous N/A N/A Tissue) Exposed: Yes Fascia: No Tendon: No Muscle: No Joint: No Bone: No ROWLEY, GROENEWEG ([id number]) Epithelialization: None N/A N/A Treatment Notes Electronic Signature(s) Signed: 01/09/2020 4:09:10 PM By: Montey Hora Entered By: Montey Hora on 01/09/2020 13:45:30 Heng, [name] ([id number]) -------------------------------------------------------------------------------- [place] Details Patient Name: [name]. Date of Service: 01/09/2020 1:30 PM Medical Record Number: [id number] Patient Account Number: [id number] Date of Birth/Sex: 1946/03/22 (73 y.o. M) Treating RN: Montey Hora Primary Care Rockie Vawter: Frazier Richards Other Clinician: Referring Anuradha Chabot: Frazier Richards Treating Ozie Lupe/Extender: Melburn Hake, HOYT Weeks in Treatment: 6 Active Inactive Abuse / Safety / Falls / Self Care Management Nursing Diagnoses: Potential for falls Goals: Patient will not experience any  injury related to falls Date Initiated: 11/28/2019 Target Resolution Date: 03/09/2020 Goal Status: Active Interventions: Assess fall risk on admission and as needed Notes: Necrotic Tissue Nursing Diagnoses: Impaired tissue integrity related to necrotic/devitalized tissue Goals: Necrotic/devitalized tissue will be minimized in the wound bed Date Initiated: 11/28/2019 Target Resolution Date: 03/09/2020 Goal Status: Active Interventions: Provide education on necrotic tissue and debridement process Notes: Nutrition Nursing Diagnoses: Potential for alteratiion in Nutrition/Potential for imbalanced nutrition Goals: Patient/caregiver agrees to and verbalizes understanding of need to use nutritional supplements and/or vitamins as prescribed Date Initiated: 11/28/2019 Target Resolution Date: 03/09/2020 Goal Status: Active Interventions: Assess patient nutrition upon admission and as needed per policy [name], [name] ([id number]) Notes: Orientation to the Wound Care Program Nursing Diagnoses: Knowledge deficit related to the wound healing center program Goals: Patient/caregiver will verbalize understanding of the [facility] Date Initiated: 11/28/2019 Target Resolution Date: 03/09/2020 Goal Status: Active Interventions: Provide education on orientation to the wound center Notes: Wound/Skin Impairment Nursing Diagnoses: Impaired tissue integrity Goals: Ulcer/skin breakdown will heal within 14 weeks Date Initiated: 11/28/2019 Target Resolution Date: 03/09/2020 Goal Status: Active Interventions: Assess patient/caregiver ability to obtain necessary supplies Assess patient/caregiver ability to perform ulcer/skin care regimen upon admission and as needed Assess ulceration(s) every visit Notes: Electronic Signature(s) Signed: 01/09/2020 4:09:10 PM By: Montey Hora Entered By: Montey Hora on 01/09/2020 13:45:20 [name], [name]  ([id number]) -------------------------------------------------------------------------------- Pain Assessment Details Patient Name: [name]. Date of Service: 01/09/2020 1:30 PM Medical Record Number: [id number] Patient Account Number: [id number] Date of Birth/Sex: 1946-03-09 (73 y.o. M) Treating RN: Montey Hora Primary Care Mialani Reicks: Frazier Richards Other Clinician: Referring Zakara Parkey: Frazier Richards Treating Tewana Bohlen/Extender: Melburn Hake, HOYT Weeks in Treatment: 6 Active Problems Location of Pain Severity and Description of Pain Patient Has Paino No Site Locations Pain Management and Medication Current Pain Management: Electronic Signature(s) Signed: 01/09/2020 4:09:10 PM By: Montey Hora Signed: 01/09/2020 4:10:34 PM By: Lorine Bears RCP, RRT, CHT Entered By: Lorine Bears on 01/09/2020 13:25:36 [place], [name] ([id number]) -------------------------------------------------------------------------------- Patient/Caregiver Education Details Patient Name: [name] Date of Service: 01/09/2020 1:30 PM Medical Record Number: [id number] Patient Account Number: [id number] Date of Birth/Gender: November 22, 1946 (73 y.o. M) Treating RN: Montey Hora Primary Care Physician: Frazier Richards Other Clinician: Referring Physician: Frazier Richards Treating Physician/Extender: Melburn Hake, HOYT Weeks in Treatment: 6 Education Assessment Education Provided To: Patient Education Topics Provided Wound/Skin Impairment: Handouts: Other: wound care as ordered Methods: Explain/Verbal Responses: State content correctly Electronic Signature(s) Signed: 01/09/2020 4:09:10  PM By: Montey Hora Entered By: Montey Hora on 01/09/2020 13:49:04 [name], [name] ([id number]) -------------------------------------------------------------------------------- Wound Assessment Details Patient Name: [name]. Date  of Service: 01/09/2020 1:30 PM Medical Record Number: [id number] Patient Account Number: [id number] Date of Birth/Sex: 1946/03/16 (73 y.o. M) Treating RN: Army Melia Primary Care Dhani Imel: Frazier Richards Other Clinician: Referring Roneka Gilpin: Frazier Richards Treating Tanaisha Pittman/Extender: Melburn Hake, HOYT Weeks in Treatment: 6 Wound Status Wound Number: 1 Primary Etiology: Diabetic Wound/Ulcer of the Lower Extremity Wound Location: Left Toe Great Wound Status: Open Wounding Event: Gradually Appeared Comorbid Hypertension, Type II Diabetes Date Acquired: 11/14/2019 History: Weeks Of Treatment: 6 Clustered Wound: No Photos Wound Measurements Length: (cm) 0.4 % Reduction i Width: (cm) 0.3 % Reduction i Depth: (cm) 0.2 Epithelializa Area: (cm) 0.094 Tunneling: Volume: (cm) 0.019 Undermining: n Area: -49.2% n Volume: 50% tion: None No No Wound Description Classification: Grade 1 Foul Odor Aft Wound Margin: Flat and Intact Slough/Fibrin Exudate Amount: Medium Exudate Type: Serosanguineous Exudate Color: red, brown er Cleansing: No o Yes Wound Bed Granulation Amount: Large (67-100%) Exposed Structure Necrotic Amount: Small (1-33%) Fascia Exposed: No Necrotic Quality: Eschar, Adherent Slough Fat Layer (Subcutaneous Tissue) Exposed: Yes Tendon Exposed: No Muscle Exposed: No Joint Exposed: No Bone Exposed: No Treatment Notes [name], [name] ([id number]) Wound #1 (Left Toe Great) Notes prisma, foam and conform Electronic Signature(s) Signed: 01/09/2020 4:02:35 PM By: Army Melia Entered By: Army Melia on 01/09/2020 13:29:57 [name], [name] ([id number]) -------------------------------------------------------------------------------- Vitals Details Patient Name: [name]. Date of Service: 01/09/2020 1:30 PM Medical Record Number: [id number] Patient Account Number: [id number] Date of Birth/Sex: [birth year] (73 y.o. M) Treating RN:  Montey Hora Primary Care Neda Willenbring: Frazier Richards Other Clinician: Referring Tupac Jeffus: Frazier Richards Treating Mafalda Mcginniss/Extender: Melburn Hake, HOYT Weeks in Treatment: 6 Vital Signs Time Taken: 13:25 Temperature (F): 98.6 Height (in): 72 Pulse (bpm): 81 Weight (lbs): 254 Respiratory Rate (breaths/min): 18 Body Mass Index (BMI): 34.4 Blood Pressure (mmHg): 116/56 Reference Range: 80 - 120 mg / dl Electronic Signature(s) Signed: 01/09/2020 4:10:34 PM By: Lorine Bears RCP, RRT, CHT Entered By: Becky Sax, Amado Nash on 01/09/2020 13:26:49

## 2020-01-09 NOTE — Progress Notes (Addendum)
RYKAR, WITTENBORN ([id number]) Visit Report for 01/09/2020 Chief Complaint Document Details Patient Name: [name], [name]. Date of Service: 01/09/2020 1:30 PM Medical Record Number: [id number] Patient Account Number: [id number] Date of Birth/Sex: 1946/10/02 (74 y.o. M) Treating RN: Montey Hora Primary Care Provider: Frazier Richards Other Clinician: Referring Provider: Frazier Richards Treating Provider/Extender: Melburn Hake, Jaliza Seifried Weeks in Treatment: 6 Information Obtained from: Patient Chief Complaint Left great toe ulcer Electronic Signature(s) Signed: 01/09/2020 1:26:25 PM By: Worthy Keeler PA-C Entered By: Worthy Keeler on 01/09/2020 13:26:24 [name] ([id number]) -------------------------------------------------------------------------------- HPI Details Patient Name: [name] Date of Service: 01/09/2020 1:30 PM Medical Record Number: [id number] Patient Account Number: [id number] Date of Birth/Sex: May 02, 1946 (74 y.o. M) Treating RN: Montey Hora Primary Care Provider: Frazier Richards Other Clinician: Referring Provider: Frazier Richards Treating Provider/Extender: Melburn Hake, Maverick Patman Weeks in Treatment: 6 History of Present Illness HPI Description: 12/12/18 on evaluation today patient presents as a referral from his endocrinologist regarding issues that he has been having with his left great toe. Subsequently he has been seeing Dr. Cleda Mccreedy and Dr. Cleda Mccreedy has been the breeding the wound and in fact this was debrided this morning. The patient had an appointment there prior to coming here. Subsequently he states that even though he's been going for regular debridement after office things really do not seem to be showing signs of improvement unfortunately. He states that he is having some discomfort but nothing too significant. No fevers, chills, nausea, or vomiting noted at this time. The patient does have a history of hypertension,  neuropathy, diabetes mellitus type II, and a history of stroke. Currently he's been using bacitracin on the toe and utilizing a Band-Aid. He tells me he's had this wound for two years. He cannot remember the last time he had an x-ray. 12/19/18 on evaluation today patient appears to be doing well in regard to his plantar toe ulcer. He's been tolerating the dressing changes without complication. Fortunately there does not appear to be signs of infection at this time which is good news. No fever chills noted. 01/19/19 has been one month since I last saw the patient as he was out of town. With that being said on evaluation today it does appear that he is going to require sharp debridement and I do believe that the total contact cast would benefit him as far as being applied at this time. He is in agreement that plan. 01/23/19 on evaluation today patient appears to be doing better in regard to his left great toe ulcer. He has been shown signs of improvement week by week and although this is slow it does seem to be doing fairly well which is good news. Overall very pleased in this regard. 01/31/19 on evaluation today patient presents for follow-up concerning his great toe ulcer. He was actually supposed to be here yesterday and he did come however he ended up having to leave due to his blood sugar dropping he states he just did not eat enough lunch. Fortunately seems to be doing much better today which is excellent news. Fortunately there's no evidence of infection at this time which is also good news. No fevers, chills, nausea, or vomiting noted at this time. Overall I feel like he is making excellent progress. 02/06/19 on evaluation today patient actually appears to be doing very well in regard to his plantar toe ulcer. He's been tolerating the dressing changes without complication. Fortunately there is no sign of infection at this time. Overall been  very pleased with how things seem to be progressing. No  fevers, chills, nausea, or vomiting noted at this time. 02/13/19 on evaluation today patient actually appears to be doing excellent in regard to his toe ulcer which is almost completely close. Overall very pleased with that. There does not appear to be any signs of infection which is good news. Upon close inspection it appears that he has just a very slight opening still noted at the central portion of the toe although this is minimal. 02/16/19 on evaluation today patient is seen for early follow-up due to the fact that he tells me while at home he actually got up to go answer the door without putting on the walking boot part of his cast and subsequently damaged the total contact cast. It therefore comes in to have this removed and potentially replaced. Fortunately other than it given him a little bit of pain its far as pushing in on some areas there doesn't appear to be any injury once the cast was removed. 03/13/19 on evaluation today patient unfortunately presents for follow-up concerning his great toe ulcer on the left. He was discharged on 02/16/19 with the wound healed at that point. He tells me this remain so for several weeks or least a couple weeks before reopening. Fortunately there does not appear to be any signs of infection at this time. Nonetheless for the past week at least he's had the wound reopened and states it is been trying to keep pressure off of this but he has not been using the offloading shoe we previously gave him. Fortunately there's no evidence of infection at this time. No fevers, chills, nausea, or vomiting noted at this time. [name], [name] ([id number]) 03/17/19 on evaluation today patient actually appears to be doing rather well in regard to his toe also. Fact this appears to be significantly smaller this week even compared to what I saw last week on evaluation. He seems to have done very well for the offloading shoe and overall I'm very pleased with the progress  that is made. It may be that he doesn't even need the cast to get this to heal if he offloads this appropriately. 03/24/19 on evaluation today patient actually appears to be doing well in regard to his plantar foot ulcer. He's been tolerating the dressing changes without complication the collagen does seem to be beneficial for him. With that being said he is gonna require some miles sharp debridement today to clear away some of the callous's around the edge of the wound as well as the minimal Slough noted on the surface of the wound. 03/31/19 on evaluation today patient appears to be doing well in regard to his plantar great toe ulcer. He has been tolerating the dressing changes without complication which is good news. Fortunately there does not appear to be any signs of active infection at this time. Overall very pleased with how things seem to be progressing. 04/11/19 on evaluation today patient's tools are appears to be doing in my pinion roughly about the same as were things that previous. Fortunately there does not appear to be signs of active infection at this time which is good news. With that being said he has not been experiencing any pain at this time which is good news there is also No fevers, chills, nausea, or vomiting noted at this time. 04/18/19 on evaluation today patient's wound actually appears to be doing fairly well today he did not even really have a lot of  callous buildup. We have been debating on whether or not to reinitiate total contact cast. With that being said he seems to be doing fairly well this point with offloading in my pinion and again I think we need to come up with a way to get this healed that will also be sustainable for keeping it close. Again we were able to close it with the cast previous but again he has to be able to keep it such. For that reason we are working on trying to get him diabetic shoes he's waiting for the [place] to get in touch with the local company  that has to measure him for these do not want to have a cast on at such a time as when they need to actually measure him. 04/25/19 on evaluation today patient appears to be doing well in regard to his plantar foot specifically great toe ulcer. He's been tolerating the dressing changes without complication. Fortunately there's no signs of active infection at this time. Overall I'm very pleased with how things appear currently. 05/02/19 on evaluation today patient appears to actually be doing better in regard to his plantar foot ulcer. He shown signs of good improvement which is excellent news. She states he can walk better with the cast on the can with the offloading shoe that he had on previous. Fortunately there's no signs of active infection at this time. No fevers, chills, nausea, or vomiting noted at this time. 05/09/19 on evaluation today patient actually appears to be doing excellent. In fact he appears to be completely healed based on evaluation at this time. Fortunately there's no signs of infection and is having no discomfort. 06/08/19 this is a follow-up visit although the patient has been healed for almost a month but not quite. He tells me that in the past several days he noted some blood coming from his toe and he thought he should come in at this checked out. Fortunately he's having a pain. He also did get his custom orthotics shoes. With that being said he still appears to have developed this ulceration there is something he tells me that the orthotic specialist stated they could do to the toes to try to help out in this regard although he did not know exactly what it was we may need to check with [facility]. 06/12/19 on evaluation today patient actually appears to be doing great in regard to his foot ulcer. The wound on his toes shown signs of excellent improvement which is great news. With that being said he is not completely close yet the cast has done wonders for him and he is  definitely headed in the correct direction. No fevers, chills, nausea, or vomiting noted at this time. 06/22/19 on evaluation today fortunately patient actually appears to be healing quite well and in fact appears to be completely healed this is excellent news. Fortunately there's no signs of active infection he's been tolerating the total contact cast without complication. Overall I'm pleased with the progress that has been made. He also has his new custom shoes he brought was with him today. 07/04/19 on evaluation today patient actually appears to be doing a little bit worse in regard his toe ulcer unfortunately this has yet again reopened. It has only been about a week and a half since I've last seen him I really did not expect this to reopen this quickly. Nonetheless I did discuss with him his normal routine in order to see what may be causing this.  He has custom shoes. With that being said he apparently has not been using the custom shoes and even when he has used them he has not [name], [name]. ([id number]) even put the insert in there which is what was custom molded to him in order to appropriately offload high-pressure point areas. In fact his exact question to me was whether or not he needed to actually put those in they just came loose in the box and he had never installed them. Nonetheless it also came to light the he's been walking around in his bedroom slippers at home he tells me he does not go barefoot but he really has not been wearing his custom offloading/diabetic shoes. Nonetheless I do believe that this is why he continually reopens as far as the wound is concerned. I discussed this with the patient in great detail today for yet again although I previously had this discussion with him in the past. 07/11/19-Patient returns at 1 week for his left great toe wound which is actually doing really well, he has been using the insert to offload the toe and is very pleased with it.  This looks like this is working out really well for him 07/18/19 on evaluation today patient appears to be doing really about the same with regard to his ulcer on the right great toe. He's been tolerating the dressing changes without complication. Fortunately there's no signs of active infection at this time. With that being said I think that we're having a difficult time getting this to heal and I do believe that initiating treatment with the total contact cast to get this to close would be a good idea. The good news is he seems to be keeping this from worsening so hopefully that means that if we get it closed he can prevent this from reopening in the future. At this point my suggestion was that we go ahead and initiate treatment at both sites utilizing collagen. We will then subsequently put a piece of silver out and it between the toes of the right foot in the webbing between the fourth and fifth toe in order to help keep things dry and allow this to hopefully close and we heal without complication. The patient is in agreement with plan. Subsequently I am hopeful that both wounds will heal quite quickly we have been very close now with the ankle and again the toe has reopened but fortunately doesn't appear to be to bad at this point. No antibiotics were prescribed today as they did not appear to be necessary. 07/25/19 on evaluation today patient actually appears to be doing excellent in regard to his great toe ulcer. In fact this really appears to be almost completely healed if indeed it is not in fact healed. With that being said there is still the eighth small pinpoint area that could be open although I tend to doubt it. Nonetheless it is something that I would like to continue to watch for more week I do think it would be beneficial for [place] to go ahead and continue with the cast for one more week before deciding to discharge him especially in light of the issues he's had in the past. Specifically  with regard to the wound reopening rather quickly. 08/01/2019 upon evaluation today patient actually appears to be doing well with regard to his great toe ulcer. He has been tolerating the dressing changes without complication. Fortunately we think appears to be completely healed at this time which is great news.  I am very pleased with the progress up to this point. In fact he appears to be completely healed at this point. Readmission: 09/15/2019 patient presents today for reevaluation here in our clinic concerning issues that he is having with his left great toe as he did previous. We have not actually seen him since August 01, 2019. He had been doing very well until he noticed when walking into the bathroom with just the socks on the other night that he had a little bit of bleeding from the toe location. Subsequently he decided to make an appointment to come in as he did not want this to get significantly worse. There does not appear to be any signs of active infection he tells me as long as he was wearing his shoes he seemed to be doing okay is walking around not necessarily barefoot but just with socks on that I think may be his main problem at this time. 09/22/2019 on evaluation today patient actually appears to be doing quite well with regard to his great toe ulcer. This is making signs of good improvement even in the short amount of time we have been taking care of his toe since he came back. Is just been 1 week. Nonetheless the improvement in the wound size is excellent he also really has no depth and no evidence of infection which is great news. 09/29/2019 on evaluation today patient appears to be doing well with regard to his toe ulcer. This is showing signs of improvement in fact it was questioning whether or not today this actually was healed. However there did appear to be some callus covering the surface of the wound was removed there did appear to be a small pinpoint opening still in the  middle of the wound although this is minimal. 10/06/2019 on evaluation today patient appears to be doing excellent in regard to his toe ulcer which actually showed signs of complete Epithelization today. There is no evidence of active infection which is good news. Overall he has been tolerating the cast without complication but I think he is done with that as of now. Readmission: 11/28/2019 on evaluation today patient appears to be unfortunately here today again for another issue with his left great toe. He has been having some significant problems intermittently over the past year or really that I been seeing him. Were able to heal him and then subsequently things will get worse yet again. With that being said the patient tells me at this time that he FAHIM, FAIRCLOUGH. ([id number]) began to have issues with drainage noted 2 weeks ago. He has no pain but again he is never really had any discomfort. No fevers, chills, nausea, vomiting, or diarrhea. He has been using his offloading shoes he tells me. 12/05/19 on evaluation today patient actually appears to be doing quite well with regard to the wound on his toe. He really hasn't been using the Pegasus offloading shoe for now. He tells me he actually forgot. With that being said there does not appear to be any signs of active infection at this time which is good news. No fevers, chills, nausea, or vomiting noted at this time. 12/12/2019 on evaluation today the patient appears to be doing quite well with regard to his toe ulcer. This does not seem to be showing any signs of worsening he has been using his custom shoes and that seems to be beneficial for him. Fortunately there is no sign of active infection at this point. No fevers,  chills, nausea, vomiting, or diarrhea. 12/19/2019 on evaluation today patient appears to be doing well with regard to his toe ulcer. He has been tolerating dressing changes without complication he is wearing his  offloading shoes and I do feel like they are helping at this point. Fortunately there is no signs of active infection at this time. 12/29-Patient comes in at 1 week visit with regards to his left great toe plantar ulcer, this has been improving, his continue to wear his offloading shoes which are definitely helping he said he noted a very small trace of discharge at 1 point however wound looks stable 01/02/2020 on evaluation today patient appears to be doing well with regard to his toe ulcer as far as things at least not getting any worse than what they have been up to this point. With that being said he continues to have some significant callus buildup however which I think is preventing him from being able to heal due to the friction and pressure getting to this area. Fortunately there is no evidence of active infection at this point which is good news. No fevers, chills, nausea, vomiting, or diarrhea. The patient has no lower extremity edema. 01/09/2020 on evaluation today patient appears to be doing about the same with regard to his toe ulcer. He has been tolerating the dressing changes without complication. Fortunately there is no signs of active infection at this point. No fever chills noted no fevers, chills, nausea, vomiting, or diarrhea. He does have a significant improvement in the overall amount of callus buildup which is good news he has been wearing his shoes he tells me pretty much all the time. With that being said in general the wound does not significantly smaller but again last week he had a lot more callus that I had removed thereby I am not really concerned about the fact that he has more area open minimally compared to last week. Nonetheless if he does not start to see signs of improvement I really think the total contact cast is going to be up utmost importance. Electronic Signature(s) Signed: 01/09/2020 1:53:24 PM By: Worthy Keeler PA-C Entered By: Worthy Keeler on  01/09/2020 13:53:23 [name], [name] ([id number]) -------------------------------------------------------------------------------- Physical Exam Details Patient Name: [name] Date of Service: 01/09/2020 1:30 PM Medical Record Number: [id number] Patient Account Number: [id number] Date of Birth/Sex: [birth year] (74 y.o. M) Treating RN: Montey Hora Primary Care Provider: Frazier Richards Other Clinician: Referring Provider: Frazier Richards Treating Provider/Extender: STONE III, Chrislynn Mosely Weeks in Treatment: 6 Constitutional Well-nourished and well-hydrated in no acute distress. Respiratory normal breathing without difficulty. Psychiatric this patient is able to make decisions and demonstrates good insight into disease process. Alert and Oriented x 3. pleasant and cooperative. Notes Patient's wound bed upon inspection today actually showed signs of fairly good granulation at this time there was some minimal slough noted in the base of the wound that required some mechanical debridement I cleaned this way easily with saline and gauze. The patient tolerated that without complication. No sharp debridement was necessary or performed. Electronic Signature(s) Signed: 01/09/2020 1:53:45 PM By: Worthy Keeler PA-C Entered By: Worthy Keeler on 01/09/2020 13:53:45 [place], [name] ([id number]) -------------------------------------------------------------------------------- Physician Orders Details Patient Name: [name] Date of Service: 01/09/2020 1:30 PM Medical Record Number: [id number] Patient Account Number: [id number] Date of Birth/Sex: [birth year] (74 y.o. M) Treating RN: Montey Hora Primary Care Provider: Frazier Richards Other Clinician: Referring Provider: Frazier Richards Treating Provider/Extender: STONE III, Rufina Kimery Weeks in  Treatment: 6 Verbal / Phone Orders: No Diagnosis Coding ICD-10 Coding Code Description E11.621 Type 2  diabetes mellitus with foot ulcer L97.522 Non-pressure chronic ulcer of other part of left foot with fat layer exposed I10 Essential (primary) hypertension E11.43 Type 2 diabetes mellitus with diabetic autonomic (poly)neuropathy Wound Cleansing Wound #1 Left Toe Great o May Shower, gently pat wound dry prior to applying new dressing. Anesthetic (add to Medication List) Wound #1 Left Toe Great o Topical Lidocaine 4% cream applied to wound bed prior to debridement (In Clinic Only). Primary Wound Dressing Wound #1 Left Toe Great o Silver Collagen Secondary Dressing Wound #1 Left Toe Great o Conform/Kerlix o Foam Dressing Change Frequency Wound #1 Left Toe Great o Change dressing every other day. Follow-up Appointments o Return Appointment in 1 week. Off-Loading Wound #1 Left Toe Great o Open toe surgical shoe with peg assist. Electronic Signature(s) Signed: 01/09/2020 4:01:12 PM By: Worthy Keeler PA-C Signed: 01/09/2020 4:09:10 PM By: Montey Hora Entered By: Montey Hora on 01/09/2020 13:48:13 [place], [name] ([id number]) JAYKUB, PARSA ([id number]) -------------------------------------------------------------------------------- Problem List Details Patient Name: GREYSIN, JOZWIAK. Date of Service: 01/09/2020 1:30 PM Medical Record Number: [id number] Patient Account Number: [id number] Date of Birth/Sex: Aug 27, 1946 (74 y.o. M) Treating RN: Montey Hora Primary Care Provider: Frazier Richards Other Clinician: Referring Provider: Frazier Richards Treating Provider/Extender: Melburn Hake, Wilburn Keir Weeks in Treatment: 6 Active Problems ICD-10 Evaluated Encounter Code Description Active Date Today Diagnosis E11.621 Type 2 diabetes mellitus with foot ulcer 11/28/2019 No Yes L97.522 Non-pressure chronic ulcer of other part of left foot with fat 11/28/2019 No Yes layer exposed I10 Essential (primary) hypertension 11/28/2019 No Yes E11.43 Type 2  diabetes mellitus with diabetic autonomic (poly) 11/28/2019 No Yes neuropathy Inactive Problems Resolved Problems Electronic Signature(s) Signed: 01/09/2020 1:26:20 PM By: Worthy Keeler PA-C Entered By: Worthy Keeler on 01/09/2020 13:26:20 [place], [name] ([id number]) -------------------------------------------------------------------------------- Progress Note Details Patient Name: [name] Date of Service: 01/09/2020 1:30 PM Medical Record Number: [id number] Patient Account Number: [id number] Date of Birth/Sex: July 19, 1946 (74 y.o. M) Treating RN: Montey Hora Primary Care Provider: Frazier Richards Other Clinician: Referring Provider: Frazier Richards Treating Provider/Extender: Melburn Hake, [place] Selke Weeks in Treatment: 6 Subjective Chief Complaint Information obtained from Patient Left great toe ulcer History of Present Illness (HPI) 12/12/18 on evaluation today patient presents as a referral from his endocrinologist regarding issues that he has been having with his left great toe. Subsequently he has been seeing Dr. Cleda Mccreedy and Dr. Cleda Mccreedy has been the breeding the wound and in fact this was debrided this morning. The patient had an appointment there prior to coming here. Subsequently he states that even though he's been going for regular debridement after office things really do not seem to be showing signs of improvement unfortunately. He states that he is having some discomfort but nothing too significant. No fevers, chills, nausea, or vomiting noted at this time. The patient does have a history of hypertension, neuropathy, diabetes mellitus type II, and a history of stroke. Currently he's been using bacitracin on the toe and utilizing a Band-Aid. He tells me he's had this wound for two years. He cannot remember the last time he had an x-ray. 12/19/18 on evaluation today patient appears to be doing well in regard to his plantar toe ulcer. He's been tolerating  the dressing changes without complication. Fortunately there does not appear to be signs of infection at this time which is good news. No fever chills noted. 01/19/19 has  been one month since I last saw the patient as he was out of town. With that being said on evaluation today it does appear that he is going to require sharp debridement and I do believe that the total contact cast would benefit him as far as being applied at this time. He is in agreement that plan. 01/23/19 on evaluation today patient appears to be doing better in regard to his left great toe ulcer. He has been shown signs of improvement week by week and although this is slow it does seem to be doing fairly well which is good news. Overall very pleased in this regard. 01/31/19 on evaluation today patient presents for follow-up concerning his great toe ulcer. He was actually supposed to be here yesterday and he did come however he ended up having to leave due to his blood sugar dropping he states he just did not eat enough lunch. Fortunately seems to be doing much better today which is excellent news. Fortunately there's no evidence of infection at this time which is also good news. No fevers, chills, nausea, or vomiting noted at this time. Overall I feel like he is making excellent progress. 02/06/19 on evaluation today patient actually appears to be doing very well in regard to his plantar toe ulcer. He's been tolerating the dressing changes without complication. Fortunately there is no sign of infection at this time. Overall been very pleased with how things seem to be progressing. No fevers, chills, nausea, or vomiting noted at this time. 02/13/19 on evaluation today patient actually appears to be doing excellent in regard to his toe ulcer which is almost completely close. Overall very pleased with that. There does not appear to be any signs of infection which is good news. Upon close inspection it appears that he has just a very  slight opening still noted at the central portion of the toe although this is minimal. 02/16/19 on evaluation today patient is seen for early follow-up due to the fact that he tells me while at home he actually got up to go answer the door without putting on the walking boot part of his cast and subsequently damaged the total contact cast. It therefore comes in to have this removed and potentially replaced. Fortunately other than it given him a little bit of pain its far as pushing in on some areas there doesn't appear to be any injury once the cast was removed. ILIJA, CHEZEM ([id number]) 03/13/19 on evaluation today patient unfortunately presents for follow-up concerning his great toe ulcer on the left. He was discharged on 02/16/19 with the wound healed at that point. He tells me this remain so for several weeks or least a couple weeks before reopening. Fortunately there does not appear to be any signs of infection at this time. Nonetheless for the past week at least he's had the wound reopened and states it is been trying to keep pressure off of this but he has not been using the offloading shoe we previously gave him. Fortunately there's no evidence of infection at this time. No fevers, chills, nausea, or vomiting noted at this time. 03/17/19 on evaluation today patient actually appears to be doing rather well in regard to his toe also. Fact this appears to be significantly smaller this week even compared to what I saw last week on evaluation. He seems to have done very well for the offloading shoe and overall I'm very pleased with the progress that is made. It may be that he  doesn't even need the cast to get this to heal if he offloads this appropriately. 03/24/19 on evaluation today patient actually appears to be doing well in regard to his plantar foot ulcer. He's been tolerating the dressing changes without complication the collagen does seem to be beneficial for him. With that being  said he is gonna require some miles sharp debridement today to clear away some of the callous's around the edge of the wound as well as the minimal Slough noted on the surface of the wound. 03/31/19 on evaluation today patient appears to be doing well in regard to his plantar great toe ulcer. He has been tolerating the dressing changes without complication which is good news. Fortunately there does not appear to be any signs of active infection at this time. Overall very pleased with how things seem to be progressing. 04/11/19 on evaluation today patient's tools are appears to be doing in my pinion roughly about the same as were things that previous. Fortunately there does not appear to be signs of active infection at this time which is good news. With that being said he has not been experiencing any pain at this time which is good news there is also No fevers, chills, nausea, or vomiting noted at this time. 04/18/19 on evaluation today patient's wound actually appears to be doing fairly well today he did not even really have a lot of callous buildup. We have been debating on whether or not to reinitiate total contact cast. With that being said he seems to be doing fairly well this point with offloading in my pinion and again I think we need to come up with a way to get this healed that will also be sustainable for keeping it close. Again we were able to close it with the cast previous but again he has to be able to keep it such. For that reason we are working on trying to get him diabetic shoes he's waiting for the [place] to get in touch with the local company that has to measure him for these do not want to have a cast on at such a time as when they need to actually measure him. 04/25/19 on evaluation today patient appears to be doing well in regard to his plantar foot specifically great toe ulcer. He's been tolerating the dressing changes without complication. Fortunately there's no signs of active  infection at this time. Overall I'm very pleased with how things appear currently. 05/02/19 on evaluation today patient appears to actually be doing better in regard to his plantar foot ulcer. He shown signs of good improvement which is excellent news. She states he can walk better with the cast on the can with the offloading shoe that he had on previous. Fortunately there's no signs of active infection at this time. No fevers, chills, nausea, or vomiting noted at this time. 05/09/19 on evaluation today patient actually appears to be doing excellent. In fact he appears to be completely healed based on evaluation at this time. Fortunately there's no signs of infection and is having no discomfort. 06/08/19 this is a follow-up visit although the patient has been healed for almost a month but not quite. He tells me that in the past several days he noted some blood coming from his toe and he thought he should come in at this checked out. Fortunately he's having a pain. He also did get his custom orthotics shoes. With that being said he still appears to have developed this ulceration there  is something he tells me that the orthotic specialist stated they could do to the toes to try to help out in this regard although he did not know exactly what it was we may need to check with [facility]. 06/12/19 on evaluation today patient actually appears to be doing great in regard to his foot ulcer. The wound on his toes shown signs of excellent improvement which is great news. With that being said he is not completely close yet the cast has done wonders for him and he is definitely headed in the correct direction. No fevers, chills, nausea, or vomiting noted at this time. 06/22/19 on evaluation today fortunately patient actually appears to be healing quite well and in fact appears to be completely healed this is excellent news. Fortunately there's no signs of active infection he's been tolerating the total contact  cast without complication. Overall I'm pleased with the progress that has been made. He also has his new custom shoes he brought was [name], [name] ([id number]) with him today. 07/04/19 on evaluation today patient actually appears to be doing a little bit worse in regard his toe ulcer unfortunately this has yet again reopened. It has only been about a week and a half since I've last seen him I really did not expect this to reopen this quickly. Nonetheless I did discuss with him his normal routine in order to see what may be causing this. He has custom shoes. With that being said he apparently has not been using the custom shoes and even when he has used them he has not even put the insert in there which is what was custom molded to him in order to appropriately offload high-pressure point areas. In fact his exact question to me was whether or not he needed to actually put those in they just came loose in the box and he had never installed them. Nonetheless it also came to light the he's been walking around in his bedroom slippers at home he tells me he does not go barefoot but he really has not been wearing his custom offloading/diabetic shoes. Nonetheless I do believe that this is why he continually reopens as far as the wound is concerned. I discussed this with the patient in great detail today for yet again although I previously had this discussion with him in the past. 07/11/19-Patient returns at 1 week for his left great toe wound which is actually doing really well, he has been using the insert to offload the toe and is very pleased with it. This looks like this is working out really well for him 07/18/19 on evaluation today patient appears to be doing really about the same with regard to his ulcer on the right great toe. He's been tolerating the dressing changes without complication. Fortunately there's no signs of active infection at this time. With that being said I think that we're  having a difficult time getting this to heal and I do believe that initiating treatment with the total contact cast to get this to close would be a good idea. The good news is he seems to be keeping this from worsening so hopefully that means that if we get it closed he can prevent this from reopening in the future. At this point my suggestion was that we go ahead and initiate treatment at both sites utilizing collagen. We will then subsequently put a piece of silver out and it between the toes of the right foot in the webbing between the  fourth and fifth toe in order to help keep things dry and allow this to hopefully close and we heal without complication. The patient is in agreement with plan. Subsequently I am hopeful that both wounds will heal quite quickly we have been very close now with the ankle and again the toe has reopened but fortunately doesn't appear to be to bad at this point. No antibiotics were prescribed today as they did not appear to be necessary. 07/25/19 on evaluation today patient actually appears to be doing excellent in regard to his great toe ulcer. In fact this really appears to be almost completely healed if indeed it is not in fact healed. With that being said there is still the eighth small pinpoint area that could be open although I tend to doubt it. Nonetheless it is something that I would like to continue to watch for more week I do think it would be beneficial for [place] to go ahead and continue with the cast for one more week before deciding to discharge him especially in light of the issues he's had in the past. Specifically with regard to the wound reopening rather quickly. 08/01/2019 upon evaluation today patient actually appears to be doing well with regard to his great toe ulcer. He has been tolerating the dressing changes without complication. Fortunately we think appears to be completely healed at this time which is great news. I am very pleased with the progress  up to this point. In fact he appears to be completely healed at this point. Readmission: 09/15/2019 patient presents today for reevaluation here in our clinic concerning issues that he is having with his left great toe as he did previous. We have not actually seen him since August 01, 2019. He had been doing very well until he noticed when walking into the bathroom with just the socks on the other night that he had a little bit of bleeding from the toe location. Subsequently he decided to make an appointment to come in as he did not want this to get significantly worse. There does not appear to be any signs of active infection he tells me as long as he was wearing his shoes he seemed to be doing okay is walking around not necessarily barefoot but just with socks on that I think may be his main problem at this time. 09/22/2019 on evaluation today patient actually appears to be doing quite well with regard to his great toe ulcer. This is making signs of good improvement even in the short amount of time we have been taking care of his toe since he came back. Is just been 1 week. Nonetheless the improvement in the wound size is excellent he also really has no depth and no evidence of infection which is great news. 09/29/2019 on evaluation today patient appears to be doing well with regard to his toe ulcer. This is showing signs of improvement in fact it was questioning whether or not today this actually was healed. However there did appear to be some callus covering the surface of the wound was removed there did appear to be a small pinpoint opening still in the middle of the wound although this is minimal. 10/06/2019 on evaluation today patient appears to be doing excellent in regard to his toe ulcer which actually showed signs of complete Epithelization today. There is no evidence of active infection which is good news. Overall he has been tolerating the cast without complication but I think he is done  with  that as of now. [name], [name] ([id number]) Readmission: 11/28/2019 on evaluation today patient appears to be unfortunately here today again for another issue with his left great toe. He has been having some significant problems intermittently over the past year or really that I been seeing him. Were able to heal him and then subsequently things will get worse yet again. With that being said the patient tells me at this time that he began to have issues with drainage noted 2 weeks ago. He has no pain but again he is never really had any discomfort. No fevers, chills, nausea, vomiting, or diarrhea. He has been using his offloading shoes he tells me. 12/05/19 on evaluation today patient actually appears to be doing quite well with regard to the wound on his toe. He really hasn't been using the Pegasus offloading shoe for now. He tells me he actually forgot. With that being said there does not appear to be any signs of active infection at this time which is good news. No fevers, chills, nausea, or vomiting noted at this time. 12/12/2019 on evaluation today the patient appears to be doing quite well with regard to his toe ulcer. This does not seem to be showing any signs of worsening he has been using his custom shoes and that seems to be beneficial for him. Fortunately there is no sign of active infection at this point. No fevers, chills, nausea, vomiting, or diarrhea. 12/19/2019 on evaluation today patient appears to be doing well with regard to his toe ulcer. He has been tolerating dressing changes without complication he is wearing his offloading shoes and I do feel like they are helping at this point. Fortunately there is no signs of active infection at this time. 12/29-Patient comes in at 1 week visit with regards to his left great toe plantar ulcer, this has been improving, his continue to wear his offloading shoes which are definitely helping he said he noted a very small trace of  discharge at 1 point however wound looks stable 01/02/2020 on evaluation today patient appears to be doing well with regard to his toe ulcer as far as things at least not getting any worse than what they have been up to this point. With that being said he continues to have some significant callus buildup however which I think is preventing him from being able to heal due to the friction and pressure getting to this area. Fortunately there is no evidence of active infection at this point which is good news. No fevers, chills, nausea, vomiting, or diarrhea. The patient has no lower extremity edema. 01/09/2020 on evaluation today patient appears to be doing about the same with regard to his toe ulcer. He has been tolerating the dressing changes without complication. Fortunately there is no signs of active infection at this point. No fever chills noted no fevers, chills, nausea, vomiting, or diarrhea. He does have a significant improvement in the overall amount of callus buildup which is good news he has been wearing his shoes he tells me pretty much all the time. With that being said in general the wound does not significantly smaller but again last week he had a lot more callus that I had removed thereby I am not really concerned about the fact that he has more area open minimally compared to last week. Nonetheless if he does not start to see signs of improvement I really think the total contact cast is going to be up utmost importance. Objective Constitutional Well-nourished and  well-hydrated in no acute distress. Vitals Time Taken: 1:25 PM, Height: 72 in, Weight: 254 lbs, BMI: 34.4, Temperature: 98.6 F, Pulse: 81 bpm, Respiratory Rate: 18 breaths/min, Blood Pressure: 116/56 mmHg. Respiratory normal breathing without difficulty. [name], [name] ([id number]) Psychiatric this patient is able to make decisions and demonstrates good insight into disease process. Alert and Oriented x 3.  pleasant and cooperative. General Notes: Patient's wound bed upon inspection today actually showed signs of fairly good granulation at this time there was some minimal slough noted in the base of the wound that required some mechanical debridement I cleaned this way easily with saline and gauze. The patient tolerated that without complication. No sharp debridement was necessary or performed. Integumentary (Hair, Skin) Wound #1 status is Open. Original cause of wound was Gradually Appeared. The wound is located on the Left Toe Great. The wound measures 0.4cm length x 0.3cm width x 0.2cm depth; 0.094cm^2 area and 0.019cm^3 volume. There is Fat Layer (Subcutaneous Tissue) Exposed exposed. There is no tunneling or undermining noted. There is a medium amount of serosanguineous drainage noted. The wound margin is flat and intact. There is large (67-100%) granulation within the wound bed. There is a small (1-33%) amount of necrotic tissue within the wound bed including Eschar and Adherent Slough. Assessment Active Problems ICD-10 Type 2 diabetes mellitus with foot ulcer Non-pressure chronic ulcer of other part of left foot with fat layer exposed Essential (primary) hypertension Type 2 diabetes mellitus with diabetic autonomic (poly)neuropathy Plan Wound Cleansing: Wound #1 Left Toe Great: May Shower, gently pat wound dry prior to applying new dressing. Anesthetic (add to Medication List): Wound #1 Left Toe Great: Topical Lidocaine 4% cream applied to wound bed prior to debridement (In Clinic Only). Primary Wound Dressing: Wound #1 Left Toe Great: Silver Collagen Secondary Dressing: Wound #1 Left Toe Great: Conform/Kerlix Foam Dressing Change Frequency: Wound #1 Left Toe Great: Change dressing every other day. Follow-up Appointments: Return Appointment in 1 week. Off-Loading: [name], [name] ([id number]) Wound #1 Left Toe Great: Open toe surgical shoe with peg assist. 1. My  suggestion currently is can be that we continue with the current wound care measures which includes silver collagen to the base of the wound. 2. We will cover the area with foam and con form to help with offloading. 3. I recommend as well he continue to wear his diabetic shoes as they do seem to be helping at this point. That seems to be better than the PEG assist offloading shoe although that is an option for him as well. We will see patient back for reevaluation in 1 week here in the clinic. If anything worsens or changes patient will contact our office for additional recommendations. Electronic Signature(s) Signed: 01/09/2020 1:54:30 PM By: Worthy Keeler PA-C Entered By: Worthy Keeler on 01/09/2020 13:54:29 [name], [name] ([id number]) -------------------------------------------------------------------------------- SuperBill Details Patient Name: [name] Date of Service: 01/09/2020 Medical Record Number: [id number] Patient Account Number: [id number] Date of Birth/Sex: February 17, 1946 (74 y.o. M) Treating RN: Montey Hora Primary Care Provider: Frazier Richards Other Clinician: Referring Provider: Frazier Richards Treating Provider/Extender: Melburn Hake, Shaeleigh Graw Weeks in Treatment: 6 Diagnosis Coding ICD-10 Codes Code Description E11.621 Type 2 diabetes mellitus with foot ulcer L97.522 Non-pressure chronic ulcer of other part of left foot with fat layer exposed I10 Essential (primary) hypertension E11.43 Type 2 diabetes mellitus with diabetic autonomic (poly)neuropathy Facility Procedures CPT4 Code: [id number] Description: 99213 - WOUND CARE VISIT-LEV 3 EST PT Modifier: Quantity: 1 Physician Procedures CPT4  Code: [id number] Description: [id number] - WC PHYS LEVEL 3 - EST PT ICD-10 Diagnosis Description E11.621 Type 2 diabetes mellitus with foot ulcer L97.522 Non-pressure chronic ulcer of other part of left foot with fat I10 Essential (primary) hypertension E11.43 Type 2  diabetes  mellitus with diabetic autonomic (poly)neuropa Modifier: layer exposed thy Quantity: 1 Electronic Signature(s) Signed: 01/09/2020 1:54:45 PM By: Worthy Keeler PA-C Entered By: Worthy Keeler on 01/09/2020 13:54:45

## 2020-01-12 DIAGNOSIS — E66812 Obesity, class 2: Secondary | ICD-10-CM | POA: Insufficient documentation

## 2020-01-12 DIAGNOSIS — Z6835 Body mass index (BMI) 35.0-35.9, adult: Secondary | ICD-10-CM | POA: Insufficient documentation

## 2020-01-16 ENCOUNTER — Encounter: Payer: Medicare Other | Admitting: Physician Assistant

## 2020-01-16 ENCOUNTER — Other Ambulatory Visit: Payer: Self-pay

## 2020-01-16 DIAGNOSIS — E11621 Type 2 diabetes mellitus with foot ulcer: Secondary | ICD-10-CM | POA: Diagnosis not present

## 2020-01-17 NOTE — Progress Notes (Signed)
Fred, [name] ([id number]) Visit Report for 01/16/2020 Arrival Information Details Patient Name: [name], [name]. Date of Service: 01/16/2020 3:45 PM Medical Record Number: [id number] Patient Account Number: [id number] Date of Birth/Sex: 1946/08/26 (74 y.o. M) Treating RN: Cornell Barman Primary Care Kyonna Frier: Frazier Richards Other Clinician: Referring Tayte Mcwherter: Frazier Richards Treating Winnona Wargo/Extender: Melburn Hake, HOYT Weeks in Treatment: 7 Visit Information History Since Last Visit Added or deleted any medications: No Patient Arrived: Ambulatory Any new allergies or adverse reactions: No Arrival Time: 15:42 Had a fall or experienced change in No Accompanied By: self activities of daily living that may affect Transfer Assistance: None risk of falls: Patient Identification Verified: Yes Signs or symptoms of abuse/neglect since last visito No Secondary Verification Process Completed: Yes Hospitalized since last visit: No Implantable device outside of the clinic excluding No cellular tissue based products placed in the center since last visit: Has Dressing in Place as Prescribed: Yes Pain Present Now: No Electronic Signature(s) Signed: 01/16/2020 3:52:50 PM By: Lorine Bears RCP, RRT, CHT Entered By: Lorine Bears on 01/16/2020 15:44:57 [place], [name] ([id number]) -------------------------------------------------------------------------------- Encounter Discharge Information Details Patient Name: [name]. Date of Service: 01/16/2020 3:45 PM Medical Record Number: [id number] Patient Account Number: [id number] Date of Birth/Sex: [birth year] (75 y.o. M) Treating RN: Army Melia Primary Care Glema Takaki: Frazier Richards Other Clinician: Referring Jiya Kissinger: Frazier Richards Treating Gabbi Whetstone/Extender: Melburn Hake, HOYT Weeks in Treatment: 7 Encounter Discharge Information Items Post Procedure Vitals Discharge  Condition: Stable Temperature (F): 98.1 Ambulatory Status: Ambulatory Pulse (bpm): 79 Discharge Destination: Home Respiratory Rate (breaths/min): 16 Transportation: Private Auto Blood Pressure (mmHg): 136/69 Accompanied By: self Schedule Follow-up Appointment: Yes Clinical Summary of Care: Electronic Signature(s) Signed: 01/17/2020 8:20:07 AM By: Army Melia Entered By: Army Melia on 01/16/2020 16:13:09 [name] ([id number]) -------------------------------------------------------------------------------- Lower Extremity Assessment Details Patient Name: [name]. Date of Service: 01/16/2020 3:45 PM Medical Record Number: [id number] Patient Account Number: [id number] Date of Birth/Sex: December 16, 1946 (74 y.o. M) Treating RN: Army Melia Primary Care Kayse Puccini: Frazier Richards Other Clinician: Referring Arabella Revelle: Frazier Richards Treating Addelynn Batte/Extender: STONE III, HOYT Weeks in Treatment: 7 Edema Assessment Assessed: [Left: No] [Right: No] Edema: [Left: N] [Right: o] Vascular Assessment Pulses: Dorsalis Pedis Palpable: [Left:Yes] Electronic Signature(s) Signed: 01/17/2020 8:20:07 AM By: Army Melia Entered By: Army Melia on 01/16/2020 16:02:05 [name] ([id number]) -------------------------------------------------------------------------------- Multi Wound Chart Details Patient Name: [name]. Date of Service: 01/16/2020 3:45 PM Medical Record Number: [id number] Patient Account Number: [id number] Date of Birth/Sex: 1946/11/02 (74 y.o. M) Treating RN: Army Melia Primary Care Ryder Chesmore: Frazier Richards Other Clinician: Referring Nadra Hritz: Frazier Richards Treating Shanta Hartner/Extender: Melburn Hake, HOYT Weeks in Treatment: 7 Vital Signs Height(in): 72 Pulse(bpm): 79 Weight(lbs): 254 Blood Pressure(mmHg): 136/69 Body Mass Index(BMI): 34 Temperature(F): 98.1 Respiratory Rate 18 (breaths/min): Photos:  [N/A:N/A] Wound Location: Left Toe Great N/A N/A Wounding Event: Gradually Appeared N/A N/A Primary Etiology: Diabetic Wound/Ulcer of the N/A N/A Lower Extremity Comorbid History: Hypertension, Type II Diabetes N/A N/A Date Acquired: 11/14/2019 N/A N/A Weeks of Treatment: 7 N/A N/A Wound Status: Open N/A N/A Measurements L x W x D 0.4x0.3x0.2 N/A N/A (cm) Area (cm) : 0.094 N/A N/A Volume (cm) : 0.019 N/A N/A % Reduction in Area: -49.20% N/A N/A % Reduction in Volume: 50.00% N/A N/A Starting Position 1 2 (o'clock): Ending Position 1 3 (o'clock): Maximum Distance 1 (cm): 1 Undermining: Yes N/A N/A Classification: Grade 1 N/A N/A Exudate Amount: Medium N/A N/A Exudate Type: Serosanguineous N/A N/A Exudate Color:  red, brown N/A N/A Wound Margin: Flat and Intact N/A N/A Granulation Amount: Large (67-100%) N/A N/A Necrotic Amount: Small (1-33%) N/A N/A Necrotic Tissue: Eschar, Adherent Slough N/A N/A Exposed Structures: N/A N/A JAYVONN, [name] ([id number]) Fat Layer (Subcutaneous Tissue) Exposed: Yes Fascia: No Tendon: No Muscle: No Joint: No Bone: No Epithelialization: None N/A N/A Treatment Notes Electronic Signature(s) Signed: 01/17/2020 8:20:07 AM By: Army Melia Entered By: Army Melia on 01/16/2020 [place], Allen ([id number]) -------------------------------------------------------------------------------- [place] Details Patient Name: [name]. Date of Service: 01/16/2020 3:45 PM Medical Record Number: [id number] Patient Account Number: [id number] Date of Birth/Sex: 1946-06-09 (74 y.o. M) Treating RN: Army Melia Primary Care Abrar Bilton: Frazier Richards Other Clinician: Referring Jerica Creegan: Frazier Richards Treating Yitzel Shasteen/Extender: Melburn Hake, HOYT Weeks in Treatment: 7 Active Inactive Abuse / Safety / Falls / Self Care Management Nursing Diagnoses: Potential for falls Goals: Patient will not  experience any injury related to falls Date Initiated: 11/28/2019 Target Resolution Date: 03/09/2020 Goal Status: Active Interventions: Assess fall risk on admission and as needed Notes: Necrotic Tissue Nursing Diagnoses: Impaired tissue integrity related to necrotic/devitalized tissue Goals: Necrotic/devitalized tissue will be minimized in the wound bed Date Initiated: 11/28/2019 Target Resolution Date: 03/09/2020 Goal Status: Active Interventions: Provide education on necrotic tissue and debridement process Notes: Nutrition Nursing Diagnoses: Potential for alteratiion in Nutrition/Potential for imbalanced nutrition Goals: Patient/caregiver agrees to and verbalizes understanding of need to use nutritional supplements and/or vitamins as prescribed Date Initiated: 11/28/2019 Target Resolution Date: 03/09/2020 Goal Status: Active Interventions: Assess patient nutrition upon admission and as needed per policy JOURDEN, KIRLEY ([id number]) Notes: Orientation to the Wound Care Program Nursing Diagnoses: Knowledge deficit related to the wound healing center program Goals: Patient/caregiver will verbalize understanding of the [place] Program Date Initiated: 11/28/2019 Target Resolution Date: 03/09/2020 Goal Status: Active Interventions: Provide education on orientation to the wound center Notes: Wound/Skin Impairment Nursing Diagnoses: Impaired tissue integrity Goals: Ulcer/skin breakdown will heal within 14 weeks Date Initiated: 11/28/2019 Target Resolution Date: 03/09/2020 Goal Status: Active Interventions: Assess patient/caregiver ability to obtain necessary supplies Assess patient/caregiver ability to perform ulcer/skin care regimen upon admission and as needed Assess ulceration(s) every visit Notes: Electronic Signature(s) Signed: 01/17/2020 8:20:07 AM By: Army Melia Entered By: Army Melia on 01/16/2020 16:07:53 [place], [name]  ([id number]) -------------------------------------------------------------------------------- Pain Assessment Details Patient Name: [name]. Date of Service: 01/16/2020 3:45 PM Medical Record Number: [id number] Patient Account Number: [id number] Date of Birth/Sex: December 31, 1945 (74 y.o. M) Treating RN: Cornell Barman Primary Care Hawkins Seaman: Frazier Richards Other Clinician: Referring Jaidin Richison: Frazier Richards Treating Laurabelle Gorczyca/Extender: Melburn Hake, HOYT Weeks in Treatment: 7 Active Problems Location of Pain Severity and Description of Pain Patient Has Paino No Site Locations Pain Management and Medication Current Pain Management: Electronic Signature(s) Signed: 01/16/2020 3:52:50 PM By: Lorine Bears RCP, RRT, CHT Signed: 01/17/2020 3:29:52 PM By: Gretta Cool, BSN, RN, CWS, Kim RN, BSN Entered By: Lorine Bears on 01/16/2020 15:45:06 [name] ([id number]) -------------------------------------------------------------------------------- Patient/Caregiver Education Details Patient Name: [name] Date of Service: 01/16/2020 3:45 PM Medical Record Number: [id number] Patient Account Number: [id number] Date of Birth/Gender: 1946-04-24 (74 y.o. M) Treating RN: Army Melia Primary Care Physician: Frazier Richards Other Clinician: Referring Physician: Frazier Richards Treating Physician/Extender: Melburn Hake, HOYT Weeks in Treatment: 7 Education Assessment Education Provided To: Patient Education Topics Provided Wound/Skin Impairment: Handouts: Caring for Your Ulcer Methods: Demonstration, Explain/Verbal Responses: State content correctly Electronic Signature(s) Signed: 01/17/2020 8:20:07 AM By: Army Melia Entered By: Nicki Reaper,  Dajea on 01/16/2020 16:12:22 VINICIO, DEYETTE ([id number]) -------------------------------------------------------------------------------- Wound Assessment Details Patient Name: HAMAD, VANDUYNE. Date of Service: 01/16/2020 3:45 PM Medical Record Number: [id number] Patient Account Number: [id number] Date of Birth/Sex: 13-Nov-1946 (74 y.o. M) Treating RN: Army Melia Primary Care Giulio Bertino: Frazier Richards Other Clinician: Referring Marl Seago: Frazier Richards Treating Emali Heyward/Extender: Melburn Hake, HOYT Weeks in Treatment: 7 Wound Status Wound Number: 1 Primary Etiology: Diabetic Wound/Ulcer of the Lower Extremity Wound Location: Left Toe Great Wound Status: Open Wounding Event: Gradually Appeared Comorbid Hypertension, Type II Diabetes Date Acquired: 11/14/2019 History: Weeks Of Treatment: 7 Clustered Wound: No Photos Wound Measurements Length: (cm) 0.4 % Reduction i Width: (cm) 0.3 % Reduction i Depth: (cm) 0.2 Epithelializa Area: (cm) 0.094 Tunneling: Volume: (cm) 0.019 Undermining: Starting P Ending Pos Maximum Di n Area: -49.2% n Volume: 50% tion: None No Yes osition (o'clock): 2 ition (o'clock): 3 stance: (cm) 1 Wound Description Classification: Grade 1 Foul Odor Aft Wound Margin: Flat and Intact Slough/Fibrin Exudate Amount: Medium Exudate Type: Serosanguineous Exudate Color: red, brown er Cleansing: No o Yes Wound Bed Granulation Amount: Large (67-100%) Exposed Structure Necrotic Amount: Small (1-33%) Fascia Exposed: No Necrotic Quality: Eschar, Adherent Slough Fat Layer (Subcutaneous Tissue) Exposed: Yes Tendon Exposed: No Muscle Exposed: No Joint Exposed: No BERLIE, GAYLE ([id number]) Bone Exposed: No Treatment Notes Wound #1 (Left Toe Great) Notes prisma, foam and conform Electronic Signature(s) Signed: 01/17/2020 8:20:07 AM By: Army Melia Entered By: Army Melia on 01/16/2020 16:01:52 [place], [name] ([id number]) -------------------------------------------------------------------------------- Vitals Details Patient Name: [name]. Date of Service: 01/16/2020 3:45 PM Medical Record  Number: [id number] Patient Account Number: [id number] Date of Birth/Sex: 12/04/1946 (74 y.o. M) Treating RN: Cornell Barman Primary Care Lyndsey Demos: Frazier Richards Other Clinician: Referring Adael Culbreath: Frazier Richards Treating Nikolaus Pienta/Extender: Melburn Hake, HOYT Weeks in Treatment: 7 Vital Signs Time Taken: 15:45 Temperature (F): 98.1 Height (in): 72 Pulse (bpm): 79 Weight (lbs): 254 Respiratory Rate (breaths/min): 18 Body Mass Index (BMI): 34.4 Blood Pressure (mmHg): 136/69 Reference Range: 80 - 120 mg / dl Electronic Signature(s) Signed: 01/16/2020 3:52:50 PM By: Lorine Bears RCP, RRT, CHT Entered By: Lorine Bears on 01/16/2020 15:46:38

## 2020-01-17 NOTE — Progress Notes (Signed)
JACQUESE, CURTAIN ([id number]) Visit Report for 01/16/2020 Chief Complaint Document Details Patient Name: [name], [name]. Date of Service: 01/16/2020 3:45 PM Medical Record Number: [id number] Patient Account Number: [id number] Date of Birth/Sex: 30-Aug-1946 (74 y.o. M) Treating RN: Cornell Barman Primary Care Provider: Frazier Richards Other Clinician: Referring Provider: Frazier Richards Treating Provider/Extender: Melburn Hake, Shiana Rappleye Weeks in Treatment: 7 Information Obtained from: Patient Chief Complaint Left great toe ulcer Electronic Signature(s) Signed: 01/16/2020 4:27:09 PM By: Worthy Keeler PA-C Entered By: Worthy Keeler on 01/16/2020 16:27:09 [name] ([id number]) -------------------------------------------------------------------------------- Debridement Details Patient Name: [name] Date of Service: 01/16/2020 3:45 PM Medical Record Number: [id number] Patient Account Number: [id number] Date of Birth/Sex: 1946/08/13 (74 y.o. M) Treating RN: Army Melia Primary Care Provider: Frazier Richards Other Clinician: Referring Provider: Frazier Richards Treating Provider/Extender: Melburn Hake, Janila Arrazola Weeks in Treatment: 7 Debridement Performed for Wound #1 Left Toe Great Assessment: Performed By: Physician STONE III, Benjimen Kelley E., PA-C Debridement Type: Debridement Severity of Tissue Pre Fat layer exposed Debridement: Level of Consciousness (Pre- Awake and Alert procedure): Pre-procedure Verification/Time Yes - 16:07 Out Taken: Start Time: 16:08 Pain Control: Lidocaine Total Area Debrided (L x W): 0.4 (cm) x 0.3 (cm) = 0.12 (cm) Tissue and other material Viable, Non-Viable, Callus, Slough, Subcutaneous, Slough debrided: Level: Skin/Subcutaneous Tissue Debridement Description: Excisional Instrument: Curette Bleeding: Minimum Hemostasis Achieved: Pressure End Time: 16:09 Response to Treatment: Procedure was tolerated well Level of  Consciousness Awake and Alert (Post-procedure): Post Debridement Measurements of Total Wound Length: (cm) 0.4 Width: (cm) 0.3 Depth: (cm) 0.2 Volume: (cm) 0.019 Character of Wound/Ulcer Post Debridement: Stable Severity of Tissue Post Debridement: Fat layer exposed Post Procedure Diagnosis Same as Pre-procedure Electronic Signature(s) Signed: 01/16/2020 4:41:20 PM By: Worthy Keeler PA-C Signed: 01/17/2020 8:20:07 AM By: Army Melia Entered By: Army Melia on 01/16/2020 16:09:16 [name], [name] ([id number]) -------------------------------------------------------------------------------- HPI Details Patient Name: [name] Date of Service: 01/16/2020 3:45 PM Medical Record Number: [id number] Patient Account Number: [id number] Date of Birth/Sex: [birth year] (74 y.o. M) Treating RN: Cornell Barman Primary Care Provider: Frazier Richards Other Clinician: Referring Provider: Frazier Richards Treating Provider/Extender: Melburn Hake, Trixy Loyola Weeks in Treatment: 7 History of Present Illness HPI Description: 12/12/18 on evaluation today patient presents as a referral from his endocrinologist regarding issues that he has been having with his left great toe. Subsequently he has been seeing Dr. Cleda Mccreedy and Dr. Cleda Mccreedy has been the breeding the wound and in fact this was debrided this morning. The patient had an appointment there prior to coming here. Subsequently he states that even though he's been going for regular debridement after office things really do not seem to be showing signs of improvement unfortunately. He states that he is having some discomfort but nothing too significant. No fevers, chills, nausea, or vomiting noted at this time. The patient does have a history of hypertension, neuropathy, diabetes mellitus type II, and a history of stroke. Currently he's been using bacitracin on the toe and utilizing a Band-Aid. He tells me he's had this wound for two years. He  cannot remember the last time he had an x-ray. 12/19/18 on evaluation today patient appears to be doing well in regard to his plantar toe ulcer. He's been tolerating the dressing changes without complication. Fortunately there does not appear to be signs of infection at this time which is good news. No fever chills noted. 01/19/19 has been one month since I last saw the patient as he was out of town. With  that being said on evaluation today it does appear that he is going to require sharp debridement and I do believe that the total contact cast would benefit him as far as being applied at this time. He is in agreement that plan. 01/23/19 on evaluation today patient appears to be doing better in regard to his left great toe ulcer. He has been shown signs of improvement week by week and although this is slow it does seem to be doing fairly well which is good news. Overall very pleased in this regard. 01/31/19 on evaluation today patient presents for follow-up concerning his great toe ulcer. He was actually supposed to be here yesterday and he did come however he ended up having to leave due to his blood sugar dropping he states he just did not eat enough lunch. Fortunately seems to be doing much better today which is excellent news. Fortunately there's no evidence of infection at this time which is also good news. No fevers, chills, nausea, or vomiting noted at this time. Overall I feel like he is making excellent progress. 02/06/19 on evaluation today patient actually appears to be doing very well in regard to his plantar toe ulcer. He's been tolerating the dressing changes without complication. Fortunately there is no sign of infection at this time. Overall been very pleased with how things seem to be progressing. No fevers, chills, nausea, or vomiting noted at this time. 02/13/19 on evaluation today patient actually appears to be doing excellent in regard to his toe ulcer which is almost completely  close. Overall very pleased with that. There does not appear to be any signs of infection which is good news. Upon close inspection it appears that he has just a very slight opening still noted at the central portion of the toe although this is minimal. 02/16/19 on evaluation today patient is seen for early follow-up due to the fact that he tells me while at home he actually got up to go answer the door without putting on the walking boot part of his cast and subsequently damaged the total contact cast. It therefore comes in to have this removed and potentially replaced. Fortunately other than it given him a little bit of pain its far as pushing in on some areas there doesn't appear to be any injury once the cast was removed. 03/13/19 on evaluation today patient unfortunately presents for follow-up concerning his great toe ulcer on the left. He was discharged on 02/16/19 with the wound healed at that point. He tells me this remain so for several weeks or least a couple weeks before reopening. Fortunately there does not appear to be any signs of infection at this time. Nonetheless for the past week at least he's had the wound reopened and states it is been trying to keep pressure off of this but he has not been using the offloading shoe we previously gave him. Fortunately there's no evidence of infection at this time. No fevers, chills, nausea, or vomiting noted at this time. [name], [name] ([id number]) 03/17/19 on evaluation today patient actually appears to be doing rather well in regard to his toe also. Fact this appears to be significantly smaller this week even compared to what I saw last week on evaluation. He seems to have done very well for the offloading shoe and overall I'm very pleased with the progress that is made. It may be that he doesn't even need the cast to get this to heal if he offloads this appropriately. 03/24/19  on evaluation today patient actually appears to be doing well  in regard to his plantar foot ulcer. He's been tolerating the dressing changes without complication the collagen does seem to be beneficial for him. With that being said he is gonna require some miles sharp debridement today to clear away some of the callous's around the edge of the wound as well as the minimal Slough noted on the surface of the wound. 03/31/19 on evaluation today patient appears to be doing well in regard to his plantar great toe ulcer. He has been tolerating the dressing changes without complication which is good news. Fortunately there does not appear to be any signs of active infection at this time. Overall very pleased with how things seem to be progressing. 04/11/19 on evaluation today patient's tools are appears to be doing in my pinion roughly about the same as were things that previous. Fortunately there does not appear to be signs of active infection at this time which is good news. With that being said he has not been experiencing any pain at this time which is good news there is also No fevers, chills, nausea, or vomiting noted at this time. 04/18/19 on evaluation today patient's wound actually appears to be doing fairly well today he did not even really have a lot of callous buildup. We have been debating on whether or not to reinitiate total contact cast. With that being said he seems to be doing fairly well this point with offloading in my pinion and again I think we need to come up with a way to get this healed that will also be sustainable for keeping it close. Again we were able to close it with the cast previous but again he has to be able to keep it such. For that reason we are working on trying to get him diabetic shoes he's waiting for the [place] to get in touch with the local company that has to measure him for these do not want to have a cast on at such a time as when they need to actually measure him. 04/25/19 on evaluation today patient appears to be doing well in  regard to his plantar foot specifically great toe ulcer. He's been tolerating the dressing changes without complication. Fortunately there's no signs of active infection at this time. Overall I'm very pleased with how things appear currently. 05/02/19 on evaluation today patient appears to actually be doing better in regard to his plantar foot ulcer. He shown signs of good improvement which is excellent news. She states he can walk better with the cast on the can with the offloading shoe that he had on previous. Fortunately there's no signs of active infection at this time. No fevers, chills, nausea, or vomiting noted at this time. 05/09/19 on evaluation today patient actually appears to be doing excellent. In fact he appears to be completely healed based on evaluation at this time. Fortunately there's no signs of infection and is having no discomfort. 06/08/19 this is a follow-up visit although the patient has been healed for almost a month but not quite. He tells me that in the past several days he noted some blood coming from his toe and he thought he should come in at this checked out. Fortunately he's having a pain. He also did get his custom orthotics shoes. With that being said he still appears to have developed this ulceration there is something he tells me that the orthotic specialist stated they could do to the toes  to try to help out in this regard although he did not know exactly what it was we may need to check with [facility]. 06/12/19 on evaluation today patient actually appears to be doing great in regard to his foot ulcer. The wound on his toes shown signs of excellent improvement which is great news. With that being said he is not completely close yet the cast has done wonders for him and he is definitely headed in the correct direction. No fevers, chills, nausea, or vomiting noted at this time. 06/22/19 on evaluation today fortunately patient actually appears to be healing quite well  and in fact appears to be completely healed this is excellent news. Fortunately there's no signs of active infection he's been tolerating the total contact cast without complication. Overall I'm pleased with the progress that has been made. He also has his new custom shoes he brought was with him today. 07/04/19 on evaluation today patient actually appears to be doing a little bit worse in regard his toe ulcer unfortunately this has yet again reopened. It has only been about a week and a half since I've last seen him I really did not expect this to reopen this quickly. Nonetheless I did discuss with him his normal routine in order to see what may be causing this. He has custom shoes. With that being said he apparently has not been using the custom shoes and even when he has used them he has not [name], YAHOLA. ([id number]) even put the insert in there which is what was custom molded to him in order to appropriately offload high-pressure point areas. In fact his exact question to me was whether or not he needed to actually put those in they just came loose in the box and he had never installed them. Nonetheless it also came to light the he's been walking around in his bedroom slippers at home he tells me he does not go barefoot but he really has not been wearing his custom offloading/diabetic shoes. Nonetheless I do believe that this is why he continually reopens as far as the wound is concerned. I discussed this with the patient in great detail today for yet again although I previously had this discussion with him in the past. 07/11/19-Patient returns at 1 week for his left great toe wound which is actually doing really well, he has been using the insert to offload the toe and is very pleased with it. This looks like this is working out really well for him 07/18/19 on evaluation today patient appears to be doing really about the same with regard to his ulcer on the right great toe. He's been  tolerating the dressing changes without complication. Fortunately there's no signs of active infection at this time. With that being said I think that we're having a difficult time getting this to heal and I do believe that initiating treatment with the total contact cast to get this to close would be a good idea. The good news is he seems to be keeping this from worsening so hopefully that means that if we get it closed he can prevent this from reopening in the future. At this point my suggestion was that we go ahead and initiate treatment at both sites utilizing collagen. We will then subsequently put a piece of silver out and it between the toes of the right foot in the webbing between the fourth and fifth toe in order to help keep things dry and allow this to hopefully  close and we heal without complication. The patient is in agreement with plan. Subsequently I am hopeful that both wounds will heal quite quickly we have been very close now with the ankle and again the toe has reopened but fortunately doesn't appear to be to bad at this point. No antibiotics were prescribed today as they did not appear to be necessary. 07/25/19 on evaluation today patient actually appears to be doing excellent in regard to his great toe ulcer. In fact this really appears to be almost completely healed if indeed it is not in fact healed. With that being said there is still the eighth small pinpoint area that could be open although I tend to doubt it. Nonetheless it is something that I would like to continue to watch for more week I do think it would be beneficial for [place] to go ahead and continue with the cast for one more week before deciding to discharge him especially in light of the issues he's had in the past. Specifically with regard to the wound reopening rather quickly. 08/01/2019 upon evaluation today patient actually appears to be doing well with regard to his great toe ulcer. He has been tolerating the  dressing changes without complication. Fortunately we think appears to be completely healed at this time which is great news. I am very pleased with the progress up to this point. In fact he appears to be completely healed at this point. Readmission: 09/15/2019 patient presents today for reevaluation here in our clinic concerning issues that he is having with his left great toe as he did previous. We have not actually seen him since August 01, 2019. He had been doing very well until he noticed when walking into the bathroom with just the socks on the other night that he had a little bit of bleeding from the toe location. Subsequently he decided to make an appointment to come in as he did not want this to get significantly worse. There does not appear to be any signs of active infection he tells me as long as he was wearing his shoes he seemed to be doing okay is walking around not necessarily barefoot but just with socks on that I think may be his main problem at this time. 09/22/2019 on evaluation today patient actually appears to be doing quite well with regard to his great toe ulcer. This is making signs of good improvement even in the short amount of time we have been taking care of his toe since he came back. Is just been 1 week. Nonetheless the improvement in the wound size is excellent he also really has no depth and no evidence of infection which is great news. 09/29/2019 on evaluation today patient appears to be doing well with regard to his toe ulcer. This is showing signs of improvement in fact it was questioning whether or not today this actually was healed. However there did appear to be some callus covering the surface of the wound was removed there did appear to be a small pinpoint opening still in the middle of the wound although this is minimal. 10/06/2019 on evaluation today patient appears to be doing excellent in regard to his toe ulcer which actually showed signs of complete  Epithelization today. There is no evidence of active infection which is good news. Overall he has been tolerating the cast without complication but I think he is done with that as of now. Readmission: 11/28/2019 on evaluation today patient appears to be unfortunately here today  again for another issue with his left great toe. He has been having some significant problems intermittently over the past year or really that I been seeing him. Were able to heal him and then subsequently things will get worse yet again. With that being said the patient tells me at this time that he [name], DICOLA. ([id number]) began to have issues with drainage noted 2 weeks ago. He has no pain but again he is never really had any discomfort. No fevers, chills, nausea, vomiting, or diarrhea. He has been using his offloading shoes he tells me. 12/05/19 on evaluation today patient actually appears to be doing quite well with regard to the wound on his toe. He really hasn't been using the Pegasus offloading shoe for now. He tells me he actually forgot. With that being said there does not appear to be any signs of active infection at this time which is good news. No fevers, chills, nausea, or vomiting noted at this time. 12/12/2019 on evaluation today the patient appears to be doing quite well with regard to his toe ulcer. This does not seem to be showing any signs of worsening he has been using his custom shoes and that seems to be beneficial for him. Fortunately there is no sign of active infection at this point. No fevers, chills, nausea, vomiting, or diarrhea. 12/19/2019 on evaluation today patient appears to be doing well with regard to his toe ulcer. He has been tolerating dressing changes without complication he is wearing his offloading shoes and I do feel like they are helping at this point. Fortunately there is no signs of active infection at this time. 12/29-Patient comes in at 1 week visit with regards to his  left great toe plantar ulcer, this has been improving, his continue to wear his offloading shoes which are definitely helping he said he noted a very small trace of discharge at 1 point however wound looks stable 01/02/2020 on evaluation today patient appears to be doing well with regard to his toe ulcer as far as things at least not getting any worse than what they have been up to this point. With that being said he continues to have some significant callus buildup however which I think is preventing him from being able to heal due to the friction and pressure getting to this area. Fortunately there is no evidence of active infection at this point which is good news. No fevers, chills, nausea, vomiting, or diarrhea. The patient has no lower extremity edema. 01/09/2020 on evaluation today patient appears to be doing about the same with regard to his toe ulcer. He has been tolerating the dressing changes without complication. Fortunately there is no signs of active infection at this point. No fever chills noted no fevers, chills, nausea, vomiting, or diarrhea. He does have a significant improvement in the overall amount of callus buildup which is good news he has been wearing his shoes he tells me pretty much all the time. With that being said in general the wound does not significantly smaller but again last week he had a lot more callus that I had removed thereby I am not really concerned about the fact that he has more area open minimally compared to last week. Nonetheless if he does not start to see signs of improvement I really think the total contact cast is going to be up utmost importance. 01/16/2020 on evaluation today patient appears to be doing a little worse due to blood blister on the left great  toe on the lateral aspect which has arisen since last time I saw him. Fortunately there is no signs of active infection at this time which is good news. Nonetheless he does not really note anything  different that he is done that would have caused this. Fortunately there is no pain but again he does have neuropathy. Electronic Signature(s) Signed: 01/16/2020 4:38:09 PM By: Worthy Keeler PA-C Entered By: Worthy Keeler on 01/16/2020 16:38:09 [name], [name] ([id number]) -------------------------------------------------------------------------------- Physical Exam Details Patient Name: [name] Date of Service: 01/16/2020 3:45 PM Medical Record Number: [id number] Patient Account Number: [id number] Date of Birth/Sex: [birth year] (74 y.o. M) Treating RN: Cornell Barman Primary Care Provider: Frazier Richards Other Clinician: Referring Provider: Frazier Richards Treating Provider/Extender: STONE III, Natajah Derderian Weeks in Treatment: 7 Constitutional Well-nourished and well-hydrated in no acute distress. Respiratory normal breathing without difficulty. Psychiatric this patient is able to make decisions and demonstrates good insight into disease process. Alert and Oriented x 3. pleasant and cooperative. Notes Upon inspection patient's wound bed actually showed signs of good granulation at this time. Fortunately there is no evidence of active infection and I am very pleased with how this seems to be progressing at this time. Although there does not appear to be any evidence of severe infection currently. I did have to perform some debridement clear away some of the callus overlying the blood blister fortunately the majority of this underneath was actually healed already. The wound itself did have some slough buildup as well as biofilm which was sharply debrided away. He tolerated all this with no pain. Electronic Signature(s) Signed: 01/16/2020 4:38:23 PM By: Worthy Keeler PA-C Entered By: Worthy Keeler on 01/16/2020 16:38:23 [name], [name] ([id number]) -------------------------------------------------------------------------------- Physician Orders  Details Patient Name: [name] Date of Service: 01/16/2020 3:45 PM Medical Record Number: [id number] Patient Account Number: [id number] Date of Birth/Sex: Jun 07, 1946 (74 y.o. M) Treating RN: Army Melia Primary Care Provider: Frazier Richards Other Clinician: Referring Provider: Frazier Richards Treating Provider/Extender: Melburn Hake, Chao Blazejewski Weeks in Treatment: 7 Verbal / Phone Orders: No Diagnosis Coding Wound Cleansing Wound #1 Left Toe Great o May Shower, gently pat wound dry prior to applying new dressing. Anesthetic (add to Medication List) Wound #1 Left Toe Great o Topical Lidocaine 4% cream applied to wound bed prior to debridement (In Clinic Only). Primary Wound Dressing Wound #1 Left Toe Great o Silver Collagen Secondary Dressing Wound #1 Left Toe Great o Conform/Kerlix o Foam Dressing Change Frequency Wound #1 Left Toe Great o Change dressing every other day. Follow-up Appointments o Return Appointment in 1 week. Off-Loading Wound #1 Left Toe Great o Open toe surgical shoe with peg assist. Electronic Signature(s) Signed: 01/16/2020 4:41:20 PM By: Worthy Keeler PA-C Signed: 01/17/2020 8:20:07 AM By: Army Melia Entered By: Army Melia on 01/16/2020 16:12:07 [name] ([id number]) -------------------------------------------------------------------------------- Problem List Details Patient Name: [name], [name]. Date of Service: 01/16/2020 3:45 PM Medical Record Number: [id number] Patient Account Number: [id number] Date of Birth/Sex: August 30, 1946 (74 y.o. 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 M) Treating RN: Cornell Barman Primary Care Provider: Frazier Richards Other Clinician: Referring Provider: Frazier Richards Treating Provider/Extender: Melburn Hake, Marc Leichter Weeks in Treatment: 7 Subjective Chief Complaint Information obtained from Patient Left great toe ulcer History of Present Illness (HPI) 12/12/18 on evaluation today patient presents as a referral from his endocrinologist regarding issues that he has been having with his left great toe. Subsequently he has been seeing Dr. Cleda Mccreedy and Dr. Cleda Mccreedy has been the breeding the wound and in fact this was debrided this morning. The patient had an appointment there prior to coming here. Subsequently he states that even though he's been going for regular debridement after office things really do not seem to be showing signs of improvement unfortunately. He states that he is having some discomfort but nothing too significant. No fevers, chills, nausea, or vomiting noted at this time. The patient does have a history of hypertension, neuropathy, diabetes mellitus type II, and a history of stroke. Currently he's been using bacitracin on the toe and utilizing a Band-Aid. He tells me he's had this wound for two years. He cannot  remember the last time he had an x-ray. 12/19/18 on evaluation today patient appears to be doing well in regard to his plantar toe ulcer. He's been tolerating the dressing changes without complication. Fortunately there does not appear to be signs of infection at this time which is good news. No fever chills noted. 01/19/19 has been one month since I last saw the patient as he was out of town. With that being said on evaluation today it does appear that he is going to require sharp debridement and I do believe that the total contact cast would benefit him as far as being applied at this time. He is in agreement that plan. 01/23/19 on evaluation today patient appears to be doing better in regard to his left great toe ulcer. He has been shown signs of improvement week by week and although this is slow it does seem to be doing fairly well which is good news. Overall very pleased in this regard. 01/31/19 on evaluation today patient presents for follow-up concerning his great toe ulcer. He was actually supposed to be here yesterday and he did come however he ended up having to leave due to his blood sugar dropping he states he just did not eat enough lunch. Fortunately seems to be doing much better today which is excellent news. Fortunately there's no evidence of infection at this time which is also good news. No fevers, chills, nausea, or vomiting noted at this time. Overall I feel like he is making excellent progress. 02/06/19 on evaluation today patient actually appears to be doing very well in regard to his plantar toe ulcer. He's been tolerating the dressing changes without complication. Fortunately there is no sign of infection at this time. Overall been very pleased with how things seem to be progressing. No fevers, chills, nausea, or vomiting noted at this time. 02/13/19 on evaluation today patient actually appears to be doing excellent in regard to his toe ulcer which is almost completely close.  Overall very pleased with that. There does not appear to be any  signs of infection which is good news. Upon close inspection it appears that he has just a very slight opening still noted at the central portion of the toe although this is minimal. 02/16/19 on evaluation today patient is seen for early follow-up due to the fact that he tells me while at home he actually got up to go answer the door without putting on the walking boot part of his cast and subsequently damaged the total contact cast. It therefore comes in to have this removed and potentially replaced. Fortunately other than it given him a little bit of pain its far as pushing in on some areas there doesn't appear to be any injury once the cast was removed. [name], [name] ([id number]) 03/13/19 on evaluation today patient unfortunately presents for follow-up concerning his great toe ulcer on the left. He was discharged on 02/16/19 with the wound healed at that point. He tells me this remain so for several weeks or least a couple weeks before reopening. Fortunately there does not appear to be any signs of infection at this time. Nonetheless for the past week at least he's had the wound reopened and states it is been trying to keep pressure off of this but he has not been using the offloading shoe we previously gave him. Fortunately there's no evidence of infection at this time. No fevers, chills, nausea, or vomiting noted at this time. 03/17/19 on evaluation today patient actually appears to be doing rather well in regard to his toe also. Fact this appears to be significantly smaller this week even compared to what I saw last week on evaluation. He seems to have done very well for the offloading shoe and overall I'm very pleased with the progress that is made. It may be that he doesn't even need the cast to get this to heal if he offloads this appropriately. 03/24/19 on evaluation today patient actually appears to be doing well in  regard to his plantar foot ulcer. He's been tolerating the dressing changes without complication the collagen does seem to be beneficial for him. With that being said he is gonna require some miles sharp debridement today to clear away some of the callous's around the edge of the wound as well as the minimal Slough noted on the surface of the wound. 03/31/19 on evaluation today patient appears to be doing well in regard to his plantar great toe ulcer. He has been tolerating the dressing changes without complication which is good news. Fortunately there does not appear to be any signs of active infection at this time. Overall very pleased with how things seem to be progressing. 04/11/19 on evaluation today patient's tools are appears to be doing in my pinion roughly about the same as were things that previous. Fortunately there does not appear to be signs of active infection at this time which is good news. With that being said he has not been experiencing any pain at this time which is good news there is also No fevers, chills, nausea, or vomiting noted at this time. 04/18/19 on evaluation today patient's wound actually appears to be doing fairly well today he did not even really have a lot of callous buildup. We have been debating on whether or not to reinitiate total contact cast. With that being said he seems to be doing fairly well this point with offloading in my pinion and again I think we need to come up with a way to get this healed that will also be sustainable  for keeping it close. Again we were able to close it with the cast previous but again he has to be able to keep it such. For that reason we are working on trying to get him diabetic shoes he's waiting for the [employer] to get in touch with the local company that has to measure him for these do not want to have a cast on at such a time as when they need to actually measure him. 04/25/19 on evaluation today patient appears to be doing well in  regard to his plantar foot specifically great toe ulcer. He's been tolerating the dressing changes without complication. Fortunately there's no signs of active infection at this time. Overall I'm very pleased with how things appear currently. 05/02/19 on evaluation today patient appears to actually be doing better in regard to his plantar foot ulcer. He shown signs of good improvement which is excellent news. She states he can walk better with the cast on the can with the offloading shoe that he had on previous. Fortunately there's no signs of active infection at this time. No fevers, chills, nausea, or vomiting noted at this time. 05/09/19 on evaluation today patient actually appears to be doing excellent. In fact he appears to be completely healed based on evaluation at this time. Fortunately there's no signs of infection and is having no discomfort. 06/08/19 this is a follow-up visit although the patient has been healed for almost a month but not quite. He tells me that in the past several days he noted some blood coming from his toe and he thought he should come in at this checked out. Fortunately he's having a pain. He also did get his custom orthotics shoes. With that being said he still appears to have developed this ulceration there is something he tells me that the orthotic specialist stated they could do to the toes to try to help out in this regard although he did not know exactly what it was we may need to check with [facility]. 06/12/19 on evaluation today patient actually appears to be doing great in regard to his foot ulcer. The wound on his toes shown signs of excellent improvement which is great news. With that being said he is not completely close yet the cast has done wonders for him and he is definitely headed in the correct direction. No fevers, chills, nausea, or vomiting noted at this time. 06/22/19 on evaluation today fortunately patient actually appears to be healing quite well  and in fact appears to be completely healed this is excellent news. Fortunately there's no signs of active infection he's been tolerating the total contact cast without complication. Overall I'm pleased with the progress that has been made. He also has his new custom shoes he brought was [name], [name] ([id number]) with him today. 07/04/19 on evaluation today patient actually appears to be doing a little bit worse in regard his toe ulcer unfortunately this has yet again reopened. It has only been about a week and a half since I've last seen him I really did not expect this to reopen this quickly. Nonetheless I did discuss with him his normal routine in order to see what may be causing this. He has custom shoes. With that being said he apparently has not been using the custom shoes and even when he has used them he has not even put the insert in there which is what was custom molded to him in order to appropriately offload high-pressure point areas. In  fact his exact question to me was whether or not he needed to actually put those in they just came loose in the box and he had never installed them. Nonetheless it also came to light the he's been walking around in his bedroom slippers at home he tells me he does not go barefoot but he really has not been wearing his custom offloading/diabetic shoes. Nonetheless I do believe that this is why he continually reopens as far as the wound is concerned. I discussed this with the patient in great detail today for yet again although I previously had this discussion with him in the past. 07/11/19-Patient returns at 1 week for his left great toe wound which is actually doing really well, he has been using the insert to offload the toe and is very pleased with it. This looks like this is working out really well for him 07/18/19 on evaluation today patient appears to be doing really about the same with regard to his ulcer on the right great toe. He's been  tolerating the dressing changes without complication. Fortunately there's no signs of active infection at this time. With that being said I think that we're having a difficult time getting this to heal and I do believe that initiating treatment with the total contact cast to get this to close would be a good idea. The good news is he seems to be keeping this from worsening so hopefully that means that if we get it closed he can prevent this from reopening in the future. At this point my suggestion was that we go ahead and initiate treatment at both sites utilizing collagen. We will then subsequently put a piece of silver out and it between the toes of the right foot in the webbing between the fourth and fifth toe in order to help keep things dry and allow this to hopefully close and we heal without complication. The patient is in agreement with plan. Subsequently I am hopeful that both wounds will heal quite quickly we have been very close now with the ankle and again the toe has reopened but fortunately doesn't appear to be to bad at this point. No antibiotics were prescribed today as they did not appear to be necessary. 07/25/19 on evaluation today patient actually appears to be doing excellent in regard to his great toe ulcer. In fact this really appears to be almost completely healed if indeed it is not in fact healed. With that being said there is still the eighth small pinpoint area that could be open although I tend to doubt it. Nonetheless it is something that I would like to continue to watch for more week I do think it would be beneficial for [place] to go ahead and continue with the cast for one more week before deciding to discharge him especially in light of the issues he's had in the past. Specifically with regard to the wound reopening rather quickly. 08/01/2019 upon evaluation today patient actually appears to be doing well with regard to his great toe ulcer. He has been tolerating the  dressing changes without complication. Fortunately we think appears to be completely healed at this time which is great news. I am very pleased with the progress up to this point. In fact he appears to be completely healed at this point. Readmission: 09/15/2019 patient presents today for reevaluation here in our clinic concerning issues that he is having with his left great toe as he did previous. We have not actually seen him  since August 01, 2019. He had been doing very well until he noticed when walking into the bathroom with just the socks on the other night that he had a little bit of bleeding from the toe location. Subsequently he decided to make an appointment to come in as he did not want this to get significantly worse. There does not appear to be any signs of active infection he tells me as long as he was wearing his shoes he seemed to be doing okay is walking around not necessarily barefoot but just with socks on that I think may be his main problem at this time. 09/22/2019 on evaluation today patient actually appears to be doing quite well with regard to his great toe ulcer. This is making signs of good improvement even in the short amount of time we have been taking care of his toe since he came back. Is just been 1 week. Nonetheless the improvement in the wound size is excellent he also really has no depth and no evidence of infection which is great news. 09/29/2019 on evaluation today patient appears to be doing well with regard to his toe ulcer. This is showing signs of improvement in fact it was questioning whether or not today this actually was healed. However there did appear to be some callus covering the surface of the wound was removed there did appear to be a small pinpoint opening still in the middle of the wound although this is minimal. 10/06/2019 on evaluation today patient appears to be doing excellent in regard to his toe ulcer which actually showed signs of complete  Epithelization today. There is no evidence of active infection which is good news. Overall he has been tolerating the cast without complication but I think he is done with that as of now. [name], [name] ([id number]) Readmission: 11/28/2019 on evaluation today patient appears to be unfortunately here today again for another issue with his left great toe. He has been having some significant problems intermittently over the past year or really that I been seeing him. Were able to heal him and then subsequently things will get worse yet again. With that being said the patient tells me at this time that he began to have issues with drainage noted 2 weeks ago. He has no pain but again he is never really had any discomfort. No fevers, chills, nausea, vomiting, or diarrhea. He has been using his offloading shoes he tells me. 12/05/19 on evaluation today patient actually appears to be doing quite well with regard to the wound on his toe. He really hasn't been using the Pegasus offloading shoe for now. He tells me he actually forgot. With that being said there does not appear to be any signs of active infection at this time which is good news. No fevers, chills, nausea, or vomiting noted at this time. 12/12/2019 on evaluation today the patient appears to be doing quite well with regard to his toe ulcer. This does not seem to be showing any signs of worsening he has been using his custom shoes and that seems to be beneficial for him. Fortunately there is no sign of active infection at this point. No fevers, chills, nausea, vomiting, or diarrhea. 12/19/2019 on evaluation today patient appears to be doing well with regard to his toe ulcer. He has been tolerating dressing changes without complication he is wearing his offloading shoes and I do feel like they are helping at this point. Fortunately there is no signs of active infection  at this time. 12/29-Patient comes in at 1 week visit with regards to his  left great toe plantar ulcer, this has been improving, his continue to wear his offloading shoes which are definitely helping he said he noted a very small trace of discharge at 1 point however wound looks stable 01/02/2020 on evaluation today patient appears to be doing well with regard to his toe ulcer as far as things at least not getting any worse than what they have been up to this point. With that being said he continues to have some significant callus buildup however which I think is preventing him from being able to heal due to the friction and pressure getting to this area. Fortunately there is no evidence of active infection at this point which is good news. No fevers, chills, nausea, vomiting, or diarrhea. The patient has no lower extremity edema. 01/09/2020 on evaluation today patient appears to be doing about the same with regard to his toe ulcer. He has been tolerating the dressing changes without complication. Fortunately there is no signs of active infection at this point. No fever chills noted no fevers, chills, nausea, vomiting, or diarrhea. He does have a significant improvement in the overall amount of callus buildup which is good news he has been wearing his shoes he tells me pretty much all the time. With that being said in general the wound does not significantly smaller but again last week he had a lot more callus that I had removed thereby I am not really concerned about the fact that he has more area open minimally compared to last week. Nonetheless if he does not start to see signs of improvement I really think the total contact cast is going to be up utmost importance. 01/16/2020 on evaluation today patient appears to be doing a little worse due to blood blister on the left great toe on the lateral aspect which has arisen since last time I saw him. Fortunately there is no signs of active infection at this time which is good news. Nonetheless he does not really note anything  different that he is done that would have caused this. Fortunately there is no pain but again he does have neuropathy. Objective Constitutional Well-nourished and well-hydrated in no acute distress. Vitals Time Taken: 3:45 PM, Height: 72 in, Weight: 254 lbs, BMI: 34.4, Temperature: 98.1 F, Pulse: 79 bpm, Respiratory [name], Sirus L. ([id number]) Rate: 18 breaths/min, Blood Pressure: 136/69 mmHg. Respiratory normal breathing without difficulty. Psychiatric this patient is able to make decisions and demonstrates good insight into disease process. Alert and Oriented x 3. pleasant and cooperative. General Notes: Upon inspection patient's wound bed actually showed signs of good granulation at this time. Fortunately there is no evidence of active infection and I am very pleased with how this seems to be progressing at this time. Although there does not appear to be any evidence of severe infection currently. I did have to perform some debridement clear away some of the callus overlying the blood blister fortunately the majority of this underneath was actually healed already. The wound itself did have some slough buildup as well as biofilm which was sharply debrided away. He tolerated all this with no pain. Integumentary (Hair, Skin) Wound #1 status is Open. Original cause of wound was Gradually Appeared. The wound is located on the Left Toe Great. The wound measures 0.4cm length x 0.3cm width x 0.2cm depth; 0.094cm^2 area and 0.019cm^3 volume. There is Fat Layer (Subcutaneous Tissue) Exposed exposed. There is  no tunneling noted, however, there is undermining starting at 2:00 and ending at 3:00 with a maximum distance of 1cm. There is a medium amount of serosanguineous drainage noted. The wound margin is flat and intact. There is large (67-100%) granulation within the wound bed. There is a small (1-33%) amount of necrotic tissue within the wound bed including Eschar and Adherent  Slough. Assessment Active Problems ICD-10 Type 2 diabetes mellitus with foot ulcer Non-pressure chronic ulcer of other part of left foot with fat layer exposed Essential (primary) hypertension Type 2 diabetes mellitus with diabetic autonomic (poly)neuropathy Procedures Wound #1 Pre-procedure diagnosis of Wound #1 is a Diabetic Wound/Ulcer of the Lower Extremity located on the Left Toe Great .Severity of Tissue Pre Debridement is: Fat layer exposed. There was a Excisional Skin/Subcutaneous Tissue Debridement with a total area of 0.12 sq cm performed by STONE III, Hien Perreira E., PA-C. With the following instrument(s): Curette to remove Viable and Non-Viable tissue/material. Material removed includes Callus, Subcutaneous Tissue, and Slough after achieving pain control using Lidocaine. A time out was conducted at 16:07, prior to the start of the procedure. A Minimum amount of bleeding was controlled with Pressure. The procedure was tolerated well. Post Debridement Measurements: 0.4cm length x 0.3cm width x 0.2cm depth; 0.019cm^3 volume. Character of Wound/Ulcer Post Debridement is stable. Severity of Tissue Post Debridement is: Fat layer exposed. Post procedure Diagnosis Wound #1: Same as Pre-Procedure LE, CLOUGHERTY ([id number]) Plan Wound Cleansing: Wound #1 Left Toe Great: May Shower, gently pat wound dry prior to applying new dressing. Anesthetic (add to Medication List): Wound #1 Left Toe Great: Topical Lidocaine 4% cream applied to wound bed prior to debridement (In Clinic Only). Primary Wound Dressing: Wound #1 Left Toe Great: Silver Collagen Secondary Dressing: Wound #1 Left Toe Great: Conform/Kerlix Foam Dressing Change Frequency: Wound #1 Left Toe Great: Change dressing every other day. Follow-up Appointments: Return Appointment in 1 week. Off-Loading: Wound #1 Left Toe Great: Open toe surgical shoe with peg assist. 1. my suggestion at this time is good to be that we  go ahead and continue with the current wound care measures which includes the collagen dressing. 2. I would recommend at this point that the patient go ahead and start wearing the open toe surgical shoe with peg assist as this may do better for him than his diabetic shoes currently with a dressing in place. 3. I am also going to recommend that the patient continue to avoid excessive walking if at all possible obviously less he can move and walk around the better. 4. With regard to the patient's potential for total contact cast he unfortunately does not want to proceed with that at this point due to the fact that he is having hand surgery within the next week. Obviously he states he is not good to be able to have the cast on during that time. I completely understand. This is for trigger finger issues that he has. We will see patient back for reevaluation in 1 week here in the clinic. If anything worsens or changes patient will contact our office for additional recommendations. Electronic Signature(s) Signed: 01/16/2020 4:40:43 PM By: Worthy Keeler PA-C Entered By: Worthy Keeler on 01/16/2020 16:40:43 [place], [name] ([id number]) -------------------------------------------------------------------------------- SuperBill Details Patient Name: [name] Date of Service: 01/16/2020 Medical Record Number: [id number] Patient Account Number: [id number] Date of Birth/Sex: [birth year] (74 y.o. 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

## 2020-01-23 ENCOUNTER — Encounter: Payer: Medicare Other | Admitting: Physician Assistant

## 2020-01-23 ENCOUNTER — Other Ambulatory Visit: Payer: Self-pay

## 2020-01-23 DIAGNOSIS — E11621 Type 2 diabetes mellitus with foot ulcer: Secondary | ICD-10-CM | POA: Diagnosis not present

## 2020-01-23 NOTE — Progress Notes (Signed)
SHANE, RIPPEON ([id number]) Visit Report for 01/23/2020 Arrival Information Details Patient Name: [name], [name]. Date of Service: 01/23/2020 11:00 AM Medical Record Number: [id number] Patient Account Number: [id number] Date of Birth/Sex: [birth year] [name] (74 y.o. M) Treating RN: Army Melia Primary Care Lulamae Skorupski: Frazier Richards Other Clinician: Referring Branna Cortina: Frazier Richards Treating Valton Schwartz/Extender: Melburn Hake, HOYT Weeks in Treatment: 8 Visit Information History Since Last Visit Added or deleted any medications: No Patient Arrived: Ambulatory Any new allergies or adverse reactions: No Arrival Time: 11:04 Had a fall or experienced change in No Accompanied By: self activities of daily living that may affect Transfer Assistance: None risk of falls: Patient Identification Verified: Yes Signs or symptoms of abuse/neglect since last visito No Hospitalized since last visit: No Has Dressing in Place as Prescribed: Yes Pain Present Now: No Electronic Signature(s) Signed: 01/23/2020 4:47:52 PM By: Army Melia Entered By: Army Melia on 01/23/2020 11:04:33 [place], [name] ([id number]) -------------------------------------------------------------------------------- Clinic Level of Care Assessment Details Patient Name: [name] Date of Service: 01/23/2020 11:00 AM Medical Record Number: [id number] Patient Account Number: [id number] Date of Birth/Sex: [birth year][name] (74 y.o. M) Treating RN: Army Melia Primary Care Meriem Lemieux: Frazier Richards Other Clinician: Referring Cresta Riden: Frazier Richards Treating Frazer [place]/Extender: Melburn Hake, HOYT Weeks in Treatment: 8 Clinic Level of Care Assessment Items TOOL 4 Quantity Score []  - Use when only an EandM is performed on FOLLOW-UP visit 0 ASSESSMENTS - Nursing Assessment / Reassessment X - Reassessment of Co-morbidities (includes updates in patient status) 1 10 X- 1 5 Reassessment of Adherence to  Treatment Plan ASSESSMENTS - Wound and Skin Assessment / Reassessment X - Simple Wound Assessment / Reassessment - one wound 1 5 []  - 0 Complex Wound Assessment / Reassessment - multiple wounds []  - 0 Dermatologic / Skin Assessment (not related to wound area) ASSESSMENTS - Focused Assessment []  - Circumferential Edema Measurements - multi extremities 0 []  - 0 Nutritional Assessment / Counseling / Intervention X- 1 5 Lower Extremity Assessment (monofilament, tuning fork, pulses) []  - 0 Peripheral Arterial Disease Assessment (using hand held doppler) ASSESSMENTS - Ostomy and/or Continence Assessment and Care []  - Incontinence Assessment and Management 0 []  - 0 Ostomy Care Assessment and Management (repouching, etc.) PROCESS - Coordination of Care X - Simple Patient / Family Education for ongoing care 1 15 []  - 0 Complex (extensive) Patient / Family Education for ongoing care []  - 0 Staff obtains Programmer, systems, Records, Test Results / Process Orders []  - 0 Staff telephones HHA, Nursing Homes / Clarify orders / etc []  - 0 Routine Transfer to another Facility (non-emergent condition) []  - 0 Routine Hospital Admission (non-emergent condition) []  - 0 New Admissions / Biomedical engineer / Ordering NPWT, Apligraf, etc. []  - 0 Emergency Hospital Admission (emergent condition) X- 1 10 Simple Discharge Coordination [name], [name] ([id number]) []  - 0 Complex (extensive) Discharge Coordination PROCESS - Special Needs []  - Pediatric / Minor Patient Management 0 []  - 0 Isolation Patient Management []  - 0 Hearing / Language / Visual special needs []  - 0 Assessment of Community assistance (transportation, D/C planning, etc.) []  - 0 Additional assistance / Altered mentation []  - 0 Support Surface(s) Assessment (bed, cushion, seat, etc.) INTERVENTIONS - Wound Cleansing / Measurement X - Simple Wound Cleansing - one wound 1 5 []  - 0 Complex Wound Cleansing - multiple  wounds X- 1 5 Wound Imaging (photographs - any number of wounds) []  - 0 Wound Tracing (instead of photographs) X- 1 5 Simple Wound Measurement - one wound []  -  0 Complex Wound Measurement - multiple wounds INTERVENTIONS - Wound Dressings []  - Small Wound Dressing one or multiple wounds 0 X- 1 15 Medium Wound Dressing one or multiple wounds []  - 0 Large Wound Dressing one or multiple wounds []  - 0 Application of Medications - topical []  - 0 Application of Medications - injection INTERVENTIONS - Miscellaneous []  - External ear exam 0 []  - 0 Specimen Collection (cultures, biopsies, blood, body fluids, etc.) []  - 0 Specimen(s) / Culture(s) sent or taken to Lab for analysis []  - 0 Patient Transfer (multiple staff / Civil Service fast streamer / Similar devices) []  - 0 Simple Staple / Suture removal (25 or less) []  - 0 Complex Staple / Suture removal (26 or more) []  - 0 Hypo / Hyperglycemic Management (close monitor of Blood Glucose) []  - 0 Ankle / Brachial Index (ABI) - do not check if billed separately X- 1 5 Vital Signs [name], [name] ([id number]) Has the patient been seen at the hospital within the last three years: Yes Total Score: 85 Level Of Care: New/Established - Level 3 Electronic Signature(s) Signed: 01/23/2020 4:47:52 PM By: Army Melia Entered By: Army Melia on 01/23/2020 11:[name]:59 [name] ([id number]) -------------------------------------------------------------------------------- Encounter Discharge Information Details Patient Name: [name]. Date of Service: 01/23/2020 11:00 AM Medical Record Number: [id number] Patient Account Number: [id number] Date of Birth/Sex: [birth year][name] (74 y.o. M) Treating RN: Army Melia Primary Care Earl Losee: Frazier Richards Other Clinician: Referring Rock Sobol: Frazier Richards Treating Karoline Fleer/Extender: Melburn Hake, HOYT Weeks in Treatment: 8 Encounter Discharge Information Items Discharge Condition:  Stable Ambulatory Status: Ambulatory Discharge Destination: Home Transportation: Private Auto Accompanied By: self Schedule Follow-up Appointment: Yes Clinical Summary of Care: Electronic Signature(s) Signed: 01/23/2020 4:47:52 PM By: Army Melia Entered By: Army Melia on 01/23/2020 11:29:37 [place], [name] ([id number]) -------------------------------------------------------------------------------- Lower Extremity Assessment Details Patient Name: [name]. Date of Service: 01/23/2020 11:00 AM Medical Record Number: [id number] Patient Account Number: [id number] Date of Birth/Sex: [birth year] (73 y.o. M) Treating RN: Army Melia Primary Care Cuong Moorman: Frazier Richards Other Clinician: Referring Calixto Pavel: Frazier Richards Treating Arnesha Schiraldi/Extender: STONE III, HOYT Weeks in Treatment: 8 Edema Assessment Assessed: [Left: No] [Right: No] Edema: [Left: N] [Right: o] Vascular Assessment Pulses: Dorsalis Pedis Palpable: [Left:Yes] Electronic Signature(s) Signed: 01/23/2020 4:47:52 PM By: Army Melia Entered By: Army Melia on 01/23/2020 11:08:46 [name], [name] ([id number]) -------------------------------------------------------------------------------- Multi Wound Chart Details Patient Name: [name]. Date of Service: 01/23/2020 11:00 AM Medical Record Number: [id number] Patient Account Number: [id number] Date of Birth/Sex: [name][birth year] (73 y.o. M) Treating RN: Army Melia Primary Care Francenia Chimenti: Frazier Richards Other Clinician: Referring Rhylee Pucillo: Frazier Richards Treating Mace Weinberg/Extender: Melburn Hake, HOYT Weeks in Treatment: 8 Vital Signs Height(in): 72 Pulse(bpm): 81 Weight(lbs): 254 Blood Pressure(mmHg): 128/77 Body Mass Index(BMI): 34 Temperature(F): 98.2 Respiratory Rate 16 (breaths/min): Photos: [N/A:N/A] Wound Location: Left Toe Great N/A N/A Wounding Event: Gradually Appeared N/A N/A Primary Etiology:  Diabetic Wound/Ulcer of the N/A N/A Lower Extremity Comorbid History: Hypertension, Type II Diabetes N/A N/A Date Acquired: 11/14/2019 N/A N/A Weeks of Treatment: 8 N/A N/A Wound Status: Open N/A N/A Measurements L x W x D 0.3x0.2x0.1 N/A N/A (cm) Area (cm) : 0.047 N/A N/A Volume (cm) : 0.005 N/A N/A % Reduction in Area: 25.40% N/A N/A % Reduction in Volume: 86.80% N/A N/A Classification: Grade 1 N/A N/A Exudate Amount: Medium N/A N/A Exudate Type: Serosanguineous N/A N/A Exudate Color: red, brown N/A N/A Wound Margin: Flat and Intact N/A N/A Granulation Amount: Large (67-100%) N/A N/A Necrotic Amount:  Small (1-33%) N/A N/A Necrotic Tissue: Eschar, Adherent Slough N/A N/A Exposed Structures: Fat Layer (Subcutaneous N/A N/A Tissue) Exposed: Yes Fascia: No Tendon: No Muscle: No Joint: No Bone: No [name], [name] ([id number]) Epithelialization: Medium (34-66%) N/A N/A Treatment Notes Electronic Signature(s) Signed: 01/23/2020 4:47:52 PM By: Army Melia Entered By: Army Melia on 01/23/2020 11:27:56 [place], [name] ([id number]) -------------------------------------------------------------------------------- [place] Details Patient Name: [name]. Date of Service: 01/23/2020 11:00 AM Medical Record Number: [id number] Patient Account Number: [id number] Date of Birth/Sex: [birth year][name] (74 y.o. M) Treating RN: Army Melia Primary Care Marice Angelino: Frazier Richards Other Clinician: Referring Jarvin Ogren: Frazier Richards Treating Brinae Woods/Extender: Melburn Hake, HOYT Weeks in Treatment: 8 Active Inactive Abuse / Safety / Falls / Self Care Management Nursing Diagnoses: Potential for falls Goals: Patient will not experience any injury related to falls Date Initiated: 11/28/2019 Target Resolution Date: 03/09/2020 Goal Status: Active Interventions: Assess fall risk on admission and as needed Notes: Nutrition Nursing  Diagnoses: Potential for alteratiion in Nutrition/Potential for imbalanced nutrition Goals: Patient/caregiver agrees to and verbalizes understanding of need to use nutritional supplements and/or vitamins as prescribed Date Initiated: 11/28/2019 Target Resolution Date: 03/09/2020 Goal Status: Active Interventions: Assess patient nutrition upon admission and as needed per policy Notes: Wound/Skin Impairment Nursing Diagnoses: Impaired tissue integrity Goals: Ulcer/skin breakdown will heal within 14 weeks Date Initiated: 11/28/2019 Target Resolution Date: 03/09/2020 Goal Status: Active Interventions: Assess patient/caregiver ability to obtain necessary supplies [name], [name] ([id number]) Assess patient/caregiver ability to perform ulcer/skin care regimen upon admission and as needed Assess ulceration(s) every visit Notes: Electronic Signature(s) Signed: 01/23/2020 4:47:52 PM By: Army Melia Entered By: Army Melia on 01/23/2020 11:27:48 [place], [name] ([id number]) -------------------------------------------------------------------------------- Pain Assessment Details Patient Name: [name]. Date of Service: 01/23/2020 11:00 AM Medical Record Number: [id number] Patient Account Number: [id number] Date of Birth/Sex: [name], [name] (73 y.o. M) Treating RN: Army Melia Primary Care Gracious Renken: Frazier Richards Other Clinician: Referring Seniya Stoffers: Frazier Richards Treating Rashawn Rolon/Extender: Melburn Hake, HOYT Weeks in Treatment: 8 Active Problems Location of Pain Severity and Description of Pain Patient Has Paino No Site Locations Pain Management and Medication Current Pain Management: Electronic Signature(s) Signed: 01/23/2020 4:47:52 PM By: Army Melia Entered By: Army Melia on 01/23/2020 11:04:44 [place], [name] ([id number]) -------------------------------------------------------------------------------- Patient/Caregiver Education  Details Patient Name: [name]. Date of Service: 01/23/2020 11:00 AM Medical Record Number: [id number] Patient Account Number: [id number] Date of Birth/Gender: [birth year] [name] (73 y.o. M) Treating RN: Army Melia Primary Care Physician: Frazier Richards Other Clinician: Referring Physician: Frazier Richards Treating Physician/Extender: Melburn Hake, HOYT Weeks in Treatment: 8 Education Assessment Education Provided To: Patient Education Topics Provided Wound/Skin Impairment: Handouts: Caring for Your Ulcer Methods: Demonstration, Explain/Verbal Responses: State content correctly Electronic Signature(s) Signed: 01/23/2020 4:47:52 PM By: Army Melia Entered By: Army Melia on 01/23/2020 11:29:12 [name] ([id number]) -------------------------------------------------------------------------------- Wound Assessment Details Patient Name: [name]. Date of Service: 01/23/2020 11:00 AM Medical Record Number: [id number] Patient Account Number: [id number] Date of Birth/Sex: [birth year][name] (73 y.o. M) Treating RN: Army Melia Primary Care Johanna Stafford: Frazier Richards Other Clinician: Referring Melvia Matousek: Frazier Richards Treating Chassity Ludke/Extender: Melburn Hake, HOYT Weeks in Treatment: 8 Wound Status Wound Number: 1 Primary Etiology: Diabetic Wound/Ulcer of the Lower Extremity Wound Location: Left Toe Great Wound Status: Open Wounding Event: Gradually Appeared Comorbid Hypertension, Type II Diabetes Date Acquired: 11/14/2019 History: Weeks Of Treatment: 8 Clustered Wound: No Photos Wound Measurements Length: (cm) 0.3 Width: (cm) 0.2 Depth: (cm) 0.1 Area: (cm) 0.047 Volume: (cm) 0.005 % Reduction in  Area: 25.4% % Reduction in Volume: 86.8% Epithelialization: Medium (34-66%) Tunneling: No Undermining: No Wound Description Classification: Grade 1 Foul Odor Aft Wound Margin: Flat and Intact Slough/Fibrin Exudate Amount: Medium Exudate  Type: Serosanguineous Exudate Color: red, brown er Cleansing: No o Yes Wound Bed Granulation Amount: Large (67-100%) Exposed Structure Necrotic Amount: Small (1-33%) Fascia Exposed: No Necrotic Quality: Eschar, Adherent Slough Fat Layer (Subcutaneous Tissue) Exposed: Yes Tendon Exposed: No Muscle Exposed: No Joint Exposed: No Bone Exposed: No Treatment Notes [place]RAJ, [place] ([id number]) Wound #1 (Left Toe Great) Notes prisma, foam and conform Electronic Signature(s) Signed: 01/23/2020 4:47:52 PM By: Army Melia Entered By: Army Melia on 01/23/2020 11:08:32 [name] ([id number]) -------------------------------------------------------------------------------- Vitals Details Patient Name: [name]. Date of Service: 01/23/2020 11:00 AM Medical Record Number: [id number] Patient Account Number: [id number] Date of Birth/Sex: [birth year] (73 y.o. M) Treating RN: Army Melia Primary Care Neymar Dowe: Frazier Richards Other Clinician: Referring Cornisha Zetino: Frazier Richards Treating Jaiveon Suppes/Extender: Melburn Hake, HOYT Weeks in Treatment: 8 Vital Signs Time Taken: 11:04 Temperature (F): 98.2 Height (in): 72 Pulse (bpm): 81 Weight (lbs): 254 Respiratory Rate (breaths/min): 16 Body Mass Index (BMI): 34.4 Blood Pressure (mmHg): 128/77 Reference Range: 80 - 120 mg / dl Electronic Signature(s) Signed: 01/23/2020 4:47:52 PM By: Army Melia Entered By: Army Melia on 01/23/2020 11:06:21

## 2020-01-23 NOTE — Progress Notes (Addendum)
[name] ([id number]) Visit Report for 01/23/2020 Chief Complaint Document Details Patient Name: [name]. Date of Service: 01/23/2020 11:00 AM Medical Record Number: [id number] Patient Account Number: [id number] Date of Birth/Sex: 1946/10/31 (74 y.o. M) Treating RN: Cornell Barman Primary Care Provider: Frazier Richards Other Clinician: Referring Provider: Frazier Richards Treating Provider/Extender: Melburn Hake, Javayah Magaw Weeks in Treatment: 8 Information Obtained from: Patient Chief Complaint Left great toe ulcer Electronic Signature(s) Signed: 01/23/2020 11:08:11 AM By: Worthy Keeler PA-C Entered By: Worthy Keeler on 01/23/2020 11:08:11 [name], [name] ([id number]) -------------------------------------------------------------------------------- HPI Details Patient Name: [name] Date of Service: 01/23/2020 11:00 AM Medical Record Number: [id number] Patient Account Number: [id number] Date of Birth/Sex: [birth year] (74 y.o. M) Treating RN: Cornell Barman Primary Care Provider: Frazier Richards Other Clinician: Referring Provider: Frazier Richards Treating Provider/Extender: Melburn Hake, Stevens Magwood Weeks in Treatment: 8 History of Present Illness HPI Description: 12/12/18 on evaluation today patient presents as a referral from his endocrinologist regarding issues that he has been having with his left great toe. Subsequently he has been seeing Dr. Cleda Mccreedy and Dr. Cleda Mccreedy has been the breeding the wound and in fact this was debrided this morning. The patient had an appointment there prior to coming here. Subsequently he states that even though he's been going for regular debridement after office things really do not seem to be showing signs of improvement unfortunately. He states that he is having some discomfort but nothing too significant. No fevers, chills, nausea, or vomiting noted at this time. The patient does have a history of hypertension,  neuropathy, diabetes mellitus type II, and a history of stroke. Currently he's been using bacitracin on the toe and utilizing a Band-Aid. He tells me he's had this wound for two years. He cannot remember the last time he had an x-ray. 12/19/18 on evaluation today patient appears to be doing well in regard to his plantar toe ulcer. He's been tolerating the dressing changes without complication. Fortunately there does not appear to be signs of infection at this time which is good news. No fever chills noted. 01/19/19 has been one month since I last saw the patient as he was out of town. With that being said on evaluation today it does appear that he is going to require sharp debridement and I do believe that the total contact cast would benefit him as far as being applied at this time. He is in agreement that plan. 01/23/19 on evaluation today patient appears to be doing better in regard to his left great toe ulcer. He has been shown signs of improvement week by week and although this is slow it does seem to be doing fairly well which is good news. Overall very pleased in this regard. 01/31/19 on evaluation today patient presents for follow-up concerning his great toe ulcer. He was actually supposed to be here yesterday and he did come however he ended up having to leave due to his blood sugar dropping he states he just did not eat enough lunch. Fortunately seems to be doing much better today which is excellent news. Fortunately there's no evidence of infection at this time which is also good news. No fevers, chills, nausea, or vomiting noted at this time. Overall I feel like he is making excellent progress. 02/06/19 on evaluation today patient actually appears to be doing very well in regard to his plantar toe ulcer. He's been tolerating the dressing changes without complication. Fortunately there is no sign of infection at this time. Overall been  very pleased with how things seem to be progressing. No  fevers, chills, nausea, or vomiting noted at this time. 02/13/19 on evaluation today patient actually appears to be doing excellent in regard to his toe ulcer which is almost completely close. Overall very pleased with that. There does not appear to be any signs of infection which is good news. Upon close inspection it appears that he has just a very slight opening still noted at the central portion of the toe although this is minimal. 02/16/19 on evaluation today patient is seen for early follow-up due to the fact that he tells me while at home he actually got up to go answer the door without putting on the walking boot part of his cast and subsequently damaged the total contact cast. It therefore comes in to have this removed and potentially replaced. Fortunately other than it given him a little bit of pain its far as pushing in on some areas there doesn't appear to be any injury once the cast was removed. 03/13/19 on evaluation today patient unfortunately presents for follow-up concerning his great toe ulcer on the left. He was discharged on 02/16/19 with the wound healed at that point. He tells me this remain so for several weeks or least a couple weeks before reopening. Fortunately there does not appear to be any signs of infection at this time. Nonetheless for the past week at least he's had the wound reopened and states it is been trying to keep pressure off of this but he has not been using the offloading shoe we previously gave him. Fortunately there's no evidence of infection at this time. No fevers, chills, nausea, or vomiting noted at this time. [name], [name] ([id number]) 03/17/19 on evaluation today patient actually appears to be doing rather well in regard to his toe also. Fact this appears to be significantly smaller this week even compared to what I saw last week on evaluation. He seems to have done very well for the offloading shoe and overall I'm very pleased with the progress  that is made. It may be that he doesn't even need the cast to get this to heal if he offloads this appropriately. 03/24/19 on evaluation today patient actually appears to be doing well in regard to his plantar foot ulcer. He's been tolerating the dressing changes without complication the collagen does seem to be beneficial for him. With that being said he is gonna require some miles sharp debridement today to clear away some of the callous's around the edge of the wound as well as the minimal Slough noted on the surface of the wound. 03/31/19 on evaluation today patient appears to be doing well in regard to his plantar great toe ulcer. He has been tolerating the dressing changes without complication which is good news. Fortunately there does not appear to be any signs of active infection at this time. Overall very pleased with how things seem to be progressing. 04/11/19 on evaluation today patient's tools are appears to be doing in my pinion roughly about the same as were things that previous. Fortunately there does not appear to be signs of active infection at this time which is good news. With that being said he has not been experiencing any pain at this time which is good news there is also No fevers, chills, nausea, or vomiting noted at this time. 04/18/19 on evaluation today patient's wound actually appears to be doing fairly well today he did not even really have a lot of  callous buildup. We have been debating on whether or not to reinitiate total contact cast. With that being said he seems to be doing fairly well this point with offloading in my pinion and again I think we need to come up with a way to get this healed that will also be sustainable for keeping it close. Again we were able to close it with the cast previous but again he has to be able to keep it such. For that reason we are working on trying to get him diabetic shoes he's waiting for the [place] to get in touch with the local company  that has to measure him for these do not want to have a cast on at such a time as when they need to actually measure him. 04/25/19 on evaluation today patient appears to be doing well in regard to his plantar foot specifically great toe ulcer. He's been tolerating the dressing changes without complication. Fortunately there's no signs of active infection at this time. Overall I'm very pleased with how things appear currently. 05/02/19 on evaluation today patient appears to actually be doing better in regard to his plantar foot ulcer. He shown signs of good improvement which is excellent news. She states he can walk better with the cast on the can with the offloading shoe that he had on previous. Fortunately there's no signs of active infection at this time. No fevers, chills, nausea, or vomiting noted at this time. 05/09/19 on evaluation today patient actually appears to be doing excellent. In fact he appears to be completely healed based on evaluation at this time. Fortunately there's no signs of infection and is having no discomfort. 06/08/19 this is a follow-up visit although the patient has been healed for almost a month but not quite. He tells me that in the past several days he noted some blood coming from his toe and he thought he should come in at this checked out. Fortunately he's having a pain. He also did get his custom orthotics shoes. With that being said he still appears to have developed this ulceration there is something he tells me that the orthotic specialist stated they could do to the toes to try to help out in this regard although he did not know exactly what it was we may need to check with [facility]. 06/12/19 on evaluation today patient actually appears to be doing great in regard to his foot ulcer. The wound on his toes shown signs of excellent improvement which is great news. With that being said he is not completely close yet the cast has done wonders for him and he is  definitely headed in the correct direction. No fevers, chills, nausea, or vomiting noted at this time. 06/22/19 on evaluation today fortunately patient actually appears to be healing quite well and in fact appears to be completely healed this is excellent news. Fortunately there's no signs of active infection he's been tolerating the total contact cast without complication. Overall I'm pleased with the progress that has been made. He also has his new custom shoes he brought was with him today. 07/04/19 on evaluation today patient actually appears to be doing a little bit worse in regard his toe ulcer unfortunately this has yet again reopened. It has only been about a week and a half since I've last seen him I really did not expect this to reopen this quickly. Nonetheless I did discuss with him his normal routine in order to see what may be causing this.  He has custom shoes. With that being said he apparently has not been using the custom shoes and even when he has used them he has not [name], [name]. ([id number]) even put the insert in there which is what was custom molded to him in order to appropriately offload high-pressure point areas. In fact his exact question to me was whether or not he needed to actually put those in they just came loose in the box and he had never installed them. Nonetheless it also came to light the he's been walking around in his bedroom slippers at home he tells me he does not go barefoot but he really has not been wearing his custom offloading/diabetic shoes. Nonetheless I do believe that this is why he continually reopens as far as the wound is concerned. I discussed this with the patient in great detail today for yet again although I previously had this discussion with him in the past. 07/11/19-Patient returns at 1 week for his left great toe wound which is actually doing really well, he has been using the insert to offload the toe and is very pleased with it.  This looks like this is working out really well for him 07/18/19 on evaluation today patient appears to be doing really about the same with regard to his ulcer on the right great toe. He's been tolerating the dressing changes without complication. Fortunately there's no signs of active infection at this time. With that being said I think that we're having a difficult time getting this to heal and I do believe that initiating treatment with the total contact cast to get this to close would be a good idea. The good news is he seems to be keeping this from worsening so hopefully that means that if we get it closed he can prevent this from reopening in the future. At this point my suggestion was that we go ahead and initiate treatment at both sites utilizing collagen. We will then subsequently put a piece of silver out and it between the toes of the right foot in the webbing between the fourth and fifth toe in order to help keep things dry and allow this to hopefully close and we heal without complication. The patient is in agreement with plan. Subsequently I am hopeful that both wounds will heal quite quickly we have been very close now with the ankle and again the toe has reopened but fortunately doesn't appear to be to bad at this point. No antibiotics were prescribed today as they did not appear to be necessary. 07/25/19 on evaluation today patient actually appears to be doing excellent in regard to his great toe ulcer. In fact this really appears to be almost completely healed if indeed it is not in fact healed. With that being said there is still the eighth small pinpoint area that could be open although I tend to doubt it. Nonetheless it is something that I would like to continue to watch for more week I do think it would be beneficial for [place] to go ahead and continue with the cast for one more week before deciding to discharge him especially in light of the issues he's had in the past. Specifically  with regard to the wound reopening rather quickly. 08/01/2019 upon evaluation today patient actually appears to be doing well with regard to his great toe ulcer. He has been tolerating the dressing changes without complication. Fortunately we think appears to be completely healed at this time which is great news.  I am very pleased with the progress up to this point. In fact he appears to be completely healed at this point. Readmission: 09/15/2019 patient presents today for reevaluation here in our clinic concerning issues that he is having with his left great toe as he did previous. We have not actually seen him since August 01, 2019. He had been doing very well until he noticed when walking into the bathroom with just the socks on the other night that he had a little bit of bleeding from the toe location. Subsequently he decided to make an appointment to come in as he did not want this to get significantly worse. There does not appear to be any signs of active infection he tells me as long as he was wearing his shoes he seemed to be doing okay is walking around not necessarily barefoot but just with socks on that I think may be his main problem at this time. 09/22/2019 on evaluation today patient actually appears to be doing quite well with regard to his great toe ulcer. This is making signs of good improvement even in the short amount of time we have been taking care of his toe since he came back. Is just been 1 week. Nonetheless the improvement in the wound size is excellent he also really has no depth and no evidence of infection which is great news. 09/29/2019 on evaluation today patient appears to be doing well with regard to his toe ulcer. This is showing signs of improvement in fact it was questioning whether or not today this actually was healed. However there did appear to be some callus covering the surface of the wound was removed there did appear to be a small pinpoint opening still in the  middle of the wound although this is minimal. 10/06/2019 on evaluation today patient appears to be doing excellent in regard to his toe ulcer which actually showed signs of complete Epithelization today. There is no evidence of active infection which is good news. Overall he has been tolerating the cast without complication but I think he is done with that as of now. Readmission: 11/28/2019 on evaluation today patient appears to be unfortunately here today again for another issue with his left great toe. He has been having some significant problems intermittently over the past year or really that I been seeing him. Were able to heal him and then subsequently things will get worse yet again. With that being said the patient tells me at this time that he [name], [name]. ([id number]) began to have issues with drainage noted 2 weeks ago. He has no pain but again he is never really had any discomfort. No fevers, chills, nausea, vomiting, or diarrhea. He has been using his offloading shoes he tells me. 12/05/19 on evaluation today patient actually appears to be doing quite well with regard to the wound on his toe. He really hasn't been using the Pegasus offloading shoe for now. He tells me he actually forgot. With that being said there does not appear to be any signs of active infection at this time which is good news. No fevers, chills, nausea, or vomiting noted at this time. 12/12/2019 on evaluation today the patient appears to be doing quite well with regard to his toe ulcer. This does not seem to be showing any signs of worsening he has been using his custom shoes and that seems to be beneficial for him. Fortunately there is no sign of active infection at this point. No fevers,  chills, nausea, vomiting, or diarrhea. 12/19/2019 on evaluation today patient appears to be doing well with regard to his toe ulcer. He has been tolerating dressing changes without complication he is wearing his  offloading shoes and I do feel like they are helping at this point. Fortunately there is no signs of active infection at this time. 12/29-Patient comes in at 1 week visit with regards to his left great toe plantar ulcer, this has been improving, his continue to wear his offloading shoes which are definitely helping he said he noted a very small trace of discharge at 1 point however wound looks stable 01/02/2020 on evaluation today patient appears to be doing well with regard to his toe ulcer as far as things at least not getting any worse than what they have been up to this point. With that being said he continues to have some significant callus buildup however which I think is preventing him from being able to heal due to the friction and pressure getting to this area. Fortunately there is no evidence of active infection at this point which is good news. No fevers, chills, nausea, vomiting, or diarrhea. The patient has no lower extremity edema. 01/09/2020 on evaluation today patient appears to be doing about the same with regard to his toe ulcer. He has been tolerating the dressing changes without complication. Fortunately there is no signs of active infection at this point. No fever chills noted no fevers, chills, nausea, vomiting, or diarrhea. He does have a significant improvement in the overall amount of callus buildup which is good news he has been wearing his shoes he tells me pretty much all the time. With that being said in general the wound does not significantly smaller but again last week he had a lot more callus that I had removed thereby I am not really concerned about the fact that he has more area open minimally compared to last week. Nonetheless if he does not start to see signs of improvement I really think the total contact cast is going to be up utmost importance. 01/16/2020 on evaluation today patient appears to be doing a little worse due to blood blister on the left great toe on  the lateral aspect which has arisen since last time I saw him. Fortunately there is no signs of active infection at this time which is good news. Nonetheless he does not really note anything different that he is done that would have caused this. Fortunately there is no pain but again he does have neuropathy. 01/23/2020 on evaluation today patient appears to be doing well with regard to his toe ulcer. He has been tolerating the dressing changes without complication. Fortunately there is no signs of active infection I do feel like this is the best that have seen his toe in quite some time. He has been using the offloading shoe instead of his personal diabetic shoes. Electronic Signature(s) Signed: 01/23/2020 11:41:57 AM By: Worthy Keeler PA-C Entered By: Worthy Keeler on 01/23/2020 11:41:57 [place], [name] ([id number]) -------------------------------------------------------------------------------- Physical Exam Details Patient Name: [name] Date of Service: 01/23/2020 11:00 AM Medical Record Number: [id number] Patient Account Number: [id number] Date of Birth/Sex: 1946/06/21 (73 y.o. M) Treating RN: Cornell Barman Primary Care Provider: Frazier Richards Other Clinician: Referring Provider: Frazier Richards Treating Provider/Extender: Melburn Hake, Quanta Roher Weeks in Treatment: 8 Constitutional Well-nourished and well-hydrated in no acute distress. Respiratory normal breathing without difficulty. Psychiatric this patient is able to make decisions and demonstrates good insight into disease  process. Alert and Oriented x 3. pleasant and cooperative. Notes Upon inspection today patient's wound bed actually showed signs of minimal slough buildup on the surface of the wound which was mechanically depleted way debrided away with saline and gauze. No sharp debridement was necessary today. There was no significant callus buildup which was also good news at this point. Electronic  Signature(s) Signed: 01/23/2020 11:42:23 AM By: Worthy Keeler PA-C Entered By: Worthy Keeler on 01/23/2020 11:42:23 [place], [name] ([id number]) -------------------------------------------------------------------------------- Physician Orders Details Patient Name: [name] Date of Service: 01/23/2020 11:00 AM Medical Record Number: [id number] Patient Account Number: [id number] Date of Birth/Sex: 10-29-1946 (73 y.o. M) Treating RN: Army Melia Primary Care Provider: Frazier Richards Other Clinician: Referring Provider: Frazier Richards Treating Provider/Extender: Melburn Hake, Janyiah Silveri Weeks in Treatment: 8 Verbal / Phone Orders: No Diagnosis Coding ICD-10 Coding Code Description E11.621 Type 2 diabetes mellitus with foot ulcer L97.522 Non-pressure chronic ulcer of other part of left foot with fat layer exposed I10 Essential (primary) hypertension E11.43 Type 2 diabetes mellitus with diabetic autonomic (poly)neuropathy Wound Cleansing Wound #1 Left Toe Great o May Shower, gently pat wound dry prior to applying new dressing. Anesthetic (add to Medication List) Wound #1 Left Toe Great o Topical Lidocaine 4% cream applied to wound bed prior to debridement (In Clinic Only). Primary Wound Dressing Wound #1 Left Toe Great o Silver Collagen Secondary Dressing Wound #1 Left Toe Great o Conform/Kerlix o Foam Dressing Change Frequency Wound #1 Left Toe Great o Change dressing every other day. Follow-up Appointments o Return Appointment in 1 week. Off-Loading Wound #1 Left Toe Great o Open toe surgical shoe with peg assist. Electronic Signature(s) Signed: 01/23/2020 11:51:12 AM By: Worthy Keeler PA-C Signed: 01/23/2020 4:47:52 PM By: Army Melia Entered By: Army Melia on 01/23/2020 11:28:34 [place], [name] ([id number]) [name], [name]  ([id number]) -------------------------------------------------------------------------------- Problem List Details Patient Name: [name], [name] Date of Service: 01/23/2020 11:00 AM Medical Record Number: [id number] Patient Account Number: [id number] Date of Birth/Sex: 1946-10-16 (73 y.o. M) Treating RN: Cornell Barman Primary Care Provider: Frazier Richards Other Clinician: Referring Provider: Frazier Richards Treating Provider/Extender: Melburn Hake, Alley Neils Weeks in Treatment: 8 Active Problems ICD-10 Evaluated Encounter Code Description Active Date Today Diagnosis E11.621 Type 2 diabetes mellitus with foot ulcer 11/28/2019 No Yes L97.522 Non-pressure chronic ulcer of other part of left foot with fat 11/28/2019 No Yes layer exposed I10 Essential (primary) hypertension 11/28/2019 No Yes E11.43 Type 2 diabetes mellitus with diabetic autonomic (poly) 11/28/2019 No Yes neuropathy Inactive Problems Resolved Problems Electronic Signature(s) Signed: 01/23/2020 11:08:04 AM By: Worthy Keeler PA-C Entered By: Worthy Keeler on 01/23/2020 11:08:04 [place], [name] ([id number]) -------------------------------------------------------------------------------- Progress Note Details Patient Name: [name] Date of Service: 01/23/2020 11:00 AM Medical Record Number: [id number] Patient Account Number: [id number] Date of Birth/Sex: [birth year] (73 y.o. M) Treating RN: Cornell Barman Primary Care Provider: Frazier Richards Other Clinician: Referring Provider: Frazier Richards Treating Provider/Extender: Melburn Hake, Jarrell Armond Weeks in Treatment: 8 Subjective Chief Complaint Information obtained from Patient Left great toe ulcer History of Present Illness (HPI) 12/12/18 on evaluation today patient presents as a referral from his endocrinologist regarding issues that he has been having with his left great toe. Subsequently he has been seeing Dr. Cleda Mccreedy and Dr. Cleda Mccreedy has been the  breeding the wound and in fact this was debrided this morning. The patient had an appointment there prior to coming here. Subsequently he states that even though he's been going for regular  debridement after office things really do not seem to be showing signs of improvement unfortunately. He states that he is having some discomfort but nothing too significant. No fevers, chills, nausea, or vomiting noted at this time. The patient does have a history of hypertension, neuropathy, diabetes mellitus type II, and a history of stroke. Currently he's been using bacitracin on the toe and utilizing a Band-Aid. He tells me he's had this wound for two years. He cannot remember the last time he had an x-ray. 12/19/18 on evaluation today patient appears to be doing well in regard to his plantar toe ulcer. He's been tolerating the dressing changes without complication. Fortunately there does not appear to be signs of infection at this time which is good news. No fever chills noted. 01/19/19 has been one month since I last saw the patient as he was out of town. With that being said on evaluation today it does appear that he is going to require sharp debridement and I do believe that the total contact cast would benefit him as far as being applied at this time. He is in agreement that plan. 01/23/19 on evaluation today patient appears to be doing better in regard to his left great toe ulcer. He has been shown signs of improvement week by week and although this is slow it does seem to be doing fairly well which is good news. Overall very pleased in this regard. 01/31/19 on evaluation today patient presents for follow-up concerning his great toe ulcer. He was actually supposed to be here yesterday and he did come however he ended up having to leave due to his blood sugar dropping he states he just did not eat enough lunch. Fortunately seems to be doing much better today which is excellent news. Fortunately there's no  evidence of infection at this time which is also good news. No fevers, chills, nausea, or vomiting noted at this time. Overall I feel like he is making excellent progress. 02/06/19 on evaluation today patient actually appears to be doing very well in regard to his plantar toe ulcer. He's been tolerating the dressing changes without complication. Fortunately there is no sign of infection at this time. Overall been very pleased with how things seem to be progressing. No fevers, chills, nausea, or vomiting noted at this time. 02/13/19 on evaluation today patient actually appears to be doing excellent in regard to his toe ulcer which is almost completely close. Overall very pleased with that. There does not appear to be any signs of infection which is good news. Upon close inspection it appears that he has just a very slight opening still noted at the central portion of the toe although this is minimal. 02/16/19 on evaluation today patient is seen for early follow-up due to the fact that he tells me while at home he actually got up to go answer the door without putting on the walking boot part of his cast and subsequently damaged the total contact cast. It therefore comes in to have this removed and potentially replaced. Fortunately other than it given him a little bit of pain its far as pushing in on some areas there doesn't appear to be any injury once the cast was removed. [name], [name] ([id number]) 03/13/19 on evaluation today patient unfortunately presents for follow-up concerning his great toe ulcer on the left. He was discharged on 02/16/19 with the wound healed at that point. He tells me this remain so for several weeks or least a couple weeks before  reopening. Fortunately there does not appear to be any signs of infection at this time. Nonetheless for the past week at least he's had the wound reopened and states it is been trying to keep pressure off of this but he has not been using the  offloading shoe we previously gave him. Fortunately there's no evidence of infection at this time. No fevers, chills, nausea, or vomiting noted at this time. 03/17/19 on evaluation today patient actually appears to be doing rather well in regard to his toe also. Fact this appears to be significantly smaller this week even compared to what I saw last week on evaluation. He seems to have done very well for the offloading shoe and overall I'm very pleased with the progress that is made. It may be that he doesn't even need the cast to get this to heal if he offloads this appropriately. 03/24/19 on evaluation today patient actually appears to be doing well in regard to his plantar foot ulcer. He's been tolerating the dressing changes without complication the collagen does seem to be beneficial for him. With that being said he is gonna require some miles sharp debridement today to clear away some of the callous's around the edge of the wound as well as the minimal Slough noted on the surface of the wound. 03/31/19 on evaluation today patient appears to be doing well in regard to his plantar great toe ulcer. He has been tolerating the dressing changes without complication which is good news. Fortunately there does not appear to be any signs of active infection at this time. Overall very pleased with how things seem to be progressing. 04/11/19 on evaluation today patient's tools are appears to be doing in my pinion roughly about the same as were things that previous. Fortunately there does not appear to be signs of active infection at this time which is good news. With that being said he has not been experiencing any pain at this time which is good news there is also No fevers, chills, nausea, or vomiting noted at this time. 04/18/19 on evaluation today patient's wound actually appears to be doing fairly well today he did not even really have a lot of callous buildup. We have been debating on whether or not to  reinitiate total contact cast. With that being said he seems to be doing fairly well this point with offloading in my pinion and again I think we need to come up with a way to get this healed that will also be sustainable for keeping it close. Again we were able to close it with the cast previous but again he has to be able to keep it such. For that reason we are working on trying to get him diabetic shoes he's waiting for the [place] to get in touch with the local company that has to measure him for these do not want to have a cast on at such a time as when they need to actually measure him. 04/25/19 on evaluation today patient appears to be doing well in regard to his plantar foot specifically great toe ulcer. He's been tolerating the dressing changes without complication. Fortunately there's no signs of active infection at this time. Overall I'm very pleased with how things appear currently. 05/02/19 on evaluation today patient appears to actually be doing better in regard to his plantar foot ulcer. He shown signs of good improvement which is excellent news. She states he can walk better with the cast on the can with the offloading  shoe that he had on previous. Fortunately there's no signs of active infection at this time. No fevers, chills, nausea, or vomiting noted at this time. 05/09/19 on evaluation today patient actually appears to be doing excellent. In fact he appears to be completely healed based on evaluation at this time. Fortunately there's no signs of infection and is having no discomfort. 06/08/19 this is a follow-up visit although the patient has been healed for almost a month but not quite. He tells me that in the past several days he noted some blood coming from his toe and he thought he should come in at this checked out. Fortunately he's having a pain. He also did get his custom orthotics shoes. With that being said he still appears to have developed this ulceration there is something he  tells me that the orthotic specialist stated they could do to the toes to try to help out in this regard although he did not know exactly what it was we may need to check with [facility]. 06/12/19 on evaluation today patient actually appears to be doing great in regard to his foot ulcer. The wound on his toes shown signs of excellent improvement which is great news. With that being said he is not completely close yet the cast has done wonders for him and he is definitely headed in the correct direction. No fevers, chills, nausea, or vomiting noted at this time. 06/22/19 on evaluation today fortunately patient actually appears to be healing quite well and in fact appears to be completely healed this is excellent news. Fortunately there's no signs of active infection he's been tolerating the total contact cast without complication. Overall I'm pleased with the progress that has been made. He also has his new custom shoes he brought was [name], [name] ([id number]) with him today. 07/04/19 on evaluation today patient actually appears to be doing a little bit worse in regard his toe ulcer unfortunately this has yet again reopened. It has only been about a week and a half since I've last seen him I really did not expect this to reopen this quickly. Nonetheless I did discuss with him his normal routine in order to see what may be causing this. He has custom shoes. With that being said he apparently has not been using the custom shoes and even when he has used them he has not even put the insert in there which is what was custom molded to him in order to appropriately offload high-pressure point areas. In fact his exact question to me was whether or not he needed to actually put those in they just came loose in the box and he had never installed them. Nonetheless it also came to light the he's been walking around in his bedroom slippers at home he tells me he does not go barefoot but he really has  not been wearing his custom offloading/diabetic shoes. Nonetheless I do believe that this is why he continually reopens as far as the wound is concerned. I discussed this with the patient in great detail today for yet again although I previously had this discussion with him in the past. 07/11/19-Patient returns at 1 week for his left great toe wound which is actually doing really well, he has been using the insert to offload the toe and is very pleased with it. This looks like this is working out really well for him 07/18/19 on evaluation today patient appears to be doing really about the same with regard to his  ulcer on the right great toe. He's been tolerating the dressing changes without complication. Fortunately there's no signs of active infection at this time. With that being said I think that we're having a difficult time getting this to heal and I do believe that initiating treatment with the total contact cast to get this to close would be a good idea. The good news is he seems to be keeping this from worsening so hopefully that means that if we get it closed he can prevent this from reopening in the future. At this point my suggestion was that we go ahead and initiate treatment at both sites utilizing collagen. We will then subsequently put a piece of silver out and it between the toes of the right foot in the webbing between the fourth and fifth toe in order to help keep things dry and allow this to hopefully close and we heal without complication. The patient is in agreement with plan. Subsequently I am hopeful that both wounds will heal quite quickly we have been very close now with the ankle and again the toe has reopened but fortunately doesn't appear to be to bad at this point. No antibiotics were prescribed today as they did not appear to be necessary. 07/25/19 on evaluation today patient actually appears to be doing excellent in regard to his great toe ulcer. In fact this  really appears to be almost completely healed if indeed it is not in fact healed. With that being said there is still the eighth small pinpoint area that could be open although I tend to doubt it. Nonetheless it is something that I would like to continue to watch for more week I do think it would be beneficial for [place] to go ahead and continue with the cast for one more week before deciding to discharge him especially in light of the issues he's had in the past. Specifically with regard to the wound reopening rather quickly. 08/01/2019 upon evaluation today patient actually appears to be doing well with regard to his great toe ulcer. He has been tolerating the dressing changes without complication. Fortunately we think appears to be completely healed at this time which is great news. I am very pleased with the progress up to this point. In fact he appears to be completely healed at this point. Readmission: 09/15/2019 patient presents today for reevaluation here in our clinic concerning issues that he is having with his left great toe as he did previous. We have not actually seen him since August 01, 2019. He had been doing very well until he noticed when walking into the bathroom with just the socks on the other night that he had a little bit of bleeding from the toe location. Subsequently he decided to make an appointment to come in as he did not want this to get significantly worse. There does not appear to be any signs of active infection he tells me as long as he was wearing his shoes he seemed to be doing okay is walking around not necessarily barefoot but just with socks on that I think may be his main problem at this time. 09/22/2019 on evaluation today patient actually appears to be doing quite well with regard to his great toe ulcer. This is making signs of good improvement even in the short amount of time we have been taking care of his toe since he came back. Is just been 1 week. Nonetheless  the improvement in the wound size is excellent he also  really has no depth and no evidence of infection which is great news. 09/29/2019 on evaluation today patient appears to be doing well with regard to his toe ulcer. This is showing signs of improvement in fact it was questioning whether or not today this actually was healed. However there did appear to be some callus covering the surface of the wound was removed there did appear to be a small pinpoint opening still in the middle of the wound although this is minimal. 10/06/2019 on evaluation today patient appears to be doing excellent in regard to his toe ulcer which actually showed signs of complete Epithelization today. There is no evidence of active infection which is good news. Overall he has been tolerating the cast without complication but I think he is done with that as of now. [name], [name] ([id number]) Readmission: 11/28/2019 on evaluation today patient appears to be unfortunately here today again for another issue with his left great toe. He has been having some significant problems intermittently over the past year or really that I been seeing him. Were able to heal him and then subsequently things will get worse yet again. With that being said the patient tells me at this time that he began to have issues with drainage noted 2 weeks ago. He has no pain but again he is never really had any discomfort. No fevers, chills, nausea, vomiting, or diarrhea. He has been using his offloading shoes he tells me. 12/05/19 on evaluation today patient actually appears to be doing quite well with regard to the wound on his toe. He really hasn't been using the Pegasus offloading shoe for now. He tells me he actually forgot. With that being said there does not appear to be any signs of active infection at this time which is good news. No fevers, chills, nausea, or vomiting noted at this time. 12/12/2019 on evaluation today the patient appears  to be doing quite well with regard to his toe ulcer. This does not seem to be showing any signs of worsening he has been using his custom shoes and that seems to be beneficial for him. Fortunately there is no sign of active infection at this point. No fevers, chills, nausea, vomiting, or diarrhea. 12/19/2019 on evaluation today patient appears to be doing well with regard to his toe ulcer. He has been tolerating dressing changes without complication he is wearing his offloading shoes and I do feel like they are helping at this point. Fortunately there is no signs of active infection at this time. 12/29-Patient comes in at 1 week visit with regards to his left great toe plantar ulcer, this has been improving, his continue to wear his offloading shoes which are definitely helping he said he noted a very small trace of discharge at 1 point however wound looks stable 01/02/2020 on evaluation today patient appears to be doing well with regard to his toe ulcer as far as things at least not getting any worse than what they have been up to this point. With that being said he continues to have some significant callus buildup however which I think is preventing him from being able to heal due to the friction and pressure getting to this area. Fortunately there is no evidence of active infection at this point which is good news. No fevers, chills, nausea, vomiting, or diarrhea. The patient has no lower extremity edema. 01/09/2020 on evaluation today patient appears to be doing about the same with regard to his toe ulcer. He has  been tolerating the dressing changes without complication. Fortunately there is no signs of active infection at this point. No fever chills noted no fevers, chills, nausea, vomiting, or diarrhea. He does have a significant improvement in the overall amount of callus buildup which is good news he has been wearing his shoes he tells me pretty much all the time. With that being said  in general the wound does not significantly smaller but again last week he had a lot more callus that I had removed thereby I am not really concerned about the fact that he has more area open minimally compared to last week. Nonetheless if he does not start to see signs of improvement I really think the total contact cast is going to be up utmost importance. 01/16/2020 on evaluation today patient appears to be doing a little worse due to blood blister on the left great toe on the lateral aspect which has arisen since last time I saw him. Fortunately there is no signs of active infection at this time which is good news. Nonetheless he does not really note anything different that he is done that would have caused this. Fortunately there is no pain but again he does have neuropathy. 01/23/2020 on evaluation today patient appears to be doing well with regard to his toe ulcer. He has been tolerating the dressing changes without complication. Fortunately there is no signs of active infection I do feel like this is the best that have seen his toe in quite some time. He has been using the offloading shoe instead of his personal diabetic shoes. Objective [name], [name] ([id number]) Constitutional Well-nourished and well-hydrated in no acute distress. Vitals Time Taken: 11:04 AM, Height: 72 in, Weight: 254 lbs, BMI: 34.4, Temperature: 98.2 F, Pulse: 81 bpm, Respiratory Rate: 16 breaths/min, Blood Pressure: 128/77 mmHg. Respiratory normal breathing without difficulty. Psychiatric this patient is able to make decisions and demonstrates good insight into disease process. Alert and Oriented x 3. pleasant and cooperative. General Notes: Upon inspection today patient's wound bed actually showed signs of minimal slough buildup on the surface of the wound which was mechanically depleted way debrided away with saline and gauze. No sharp debridement was necessary today. There was no significant callus  buildup which was also good news at this point. Integumentary (Hair, Skin) Wound #1 status is Open. Original cause of wound was Gradually Appeared. The wound is located on the Left Toe Great. The wound measures 0.3cm length x 0.2cm width x 0.1cm depth; 0.047cm^2 area and 0.005cm^3 volume. There is Fat Layer (Subcutaneous Tissue) Exposed exposed. There is no tunneling or undermining noted. There is a medium amount of serosanguineous drainage noted. The wound margin is flat and intact. There is large (67-100%) granulation within the wound bed. There is a small (1-33%) amount of necrotic tissue within the wound bed including Eschar and Adherent Slough. Assessment Active Problems ICD-10 Type 2 diabetes mellitus with foot ulcer Non-pressure chronic ulcer of other part of left foot with fat layer exposed Essential (primary) hypertension Type 2 diabetes mellitus with diabetic autonomic (poly)neuropathy Plan Wound Cleansing: Wound #1 Left Toe Great: May Shower, gently pat wound dry prior to applying new dressing. Anesthetic (add to Medication List): Wound #1 Left Toe Great: Topical Lidocaine 4% cream applied to wound bed prior to debridement (In Clinic Only). Primary Wound Dressing: Wound #1 Left Toe Great: Silver Collagen Secondary Dressing: Wound #1 Left Toe GreatJEMERY, [name] ([id number]) Conform/Kerlix Foam Dressing Change Frequency: Wound #1 Left Toe Great:  Change dressing every other day. Follow-up Appointments: Return Appointment in 1 week. Off-Loading: Wound #1 Left Toe Great: Open toe surgical shoe with peg assist. 1. My suggestion at this time is can be that we continue with the silver collagen. The patient is in agreement with that plan has been doing very well with this. 2. We will get a suggest as well but he also continue with the offloading shoe which seems to have been beneficial for him over the past week I think that is much better than his personal shoe  right now. 3. I am also going to suggest that we go ahead and have the patient continue to limit walking is much as possible to try to get this area to heal I think that will also be of benefit. We will see patient back for reevaluation in 1 week here in the clinic. If anything worsens or changes patient will contact our office for additional recommendations. Electronic Signature(s) Signed: 01/23/2020 11:43:12 AM By: Worthy Keeler PA-C Entered By: Worthy Keeler on 01/23/2020 11:43:12 [place], [name] ([id number]) -------------------------------------------------------------------------------- SuperBill Details Patient Name: [name] Date of Service: 01/23/2020 Medical Record Number: [id number] Patient Account Number: [id number] Date of Birth/Sex: 04-Sep-1946 (74 y.o. M) Treating RN: Cornell Barman Primary Care Provider: Frazier Richards Other Clinician: Referring Provider: Frazier Richards Treating Provider/Extender: Melburn Hake, Danita Proud Weeks in Treatment: 8 Diagnosis Coding ICD-10 Codes Code Description E11.621 Type 2 diabetes mellitus with foot ulcer L97.522 Non-pressure chronic ulcer of other part of left foot with fat layer exposed I10 Essential (primary) hypertension E11.43 Type 2 diabetes mellitus with diabetic autonomic (poly)neuropathy Facility Procedures CPT4 Code: [id number] Description: 99213 - WOUND CARE VISIT-LEV 3 EST PT Modifier: Quantity: 1 Physician Procedures CPT4 Code: [id number] Description: 99213 - WC PHYS LEVEL 3 - EST PT ICD-10 Diagnosis Description E11.621 Type 2 diabetes mellitus with foot ulcer L97.522 Non-pressure chronic ulcer of other part of left foot with fat I10 Essential (primary) hypertension 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

## 2020-01-30 ENCOUNTER — Other Ambulatory Visit: Payer: Self-pay

## 2020-01-30 ENCOUNTER — Encounter: Payer: Medicare Other | Attending: Physician Assistant | Admitting: Physician Assistant

## 2020-01-30 DIAGNOSIS — Z8673 Personal history of transient ischemic attack (TIA), and cerebral infarction without residual deficits: Secondary | ICD-10-CM | POA: Insufficient documentation

## 2020-01-30 DIAGNOSIS — I10 Essential (primary) hypertension: Secondary | ICD-10-CM | POA: Diagnosis not present

## 2020-01-30 DIAGNOSIS — E11621 Type 2 diabetes mellitus with foot ulcer: Secondary | ICD-10-CM | POA: Diagnosis present

## 2020-01-30 DIAGNOSIS — L97522 Non-pressure chronic ulcer of other part of left foot with fat layer exposed: Secondary | ICD-10-CM | POA: Insufficient documentation

## 2020-01-30 DIAGNOSIS — E1143 Type 2 diabetes mellitus with diabetic autonomic (poly)neuropathy: Secondary | ICD-10-CM | POA: Diagnosis not present

## 2020-01-30 NOTE — Progress Notes (Addendum)
[name] ([id number]) Visit Report for 01/30/2020 Chief Complaint Document Details Patient Name: [name], [name]. Date of Service: 01/30/2020 1:45 PM Medical Record Number: [id number] Patient Account Number: [id number] Date of Birth/Sex: [birth year] (74 y.o. M) Treating RN: [name] Primary Care Provider: Frazier [name] Other Clinician: Referring Provider: Frazier [name] Treating Provider/Extender: [name], [name] [name] in Treatment: 9 Information Obtained from: Patient Chief Complaint Left great toe ulcer Electronic Signature(s) Signed: 01/30/2020 1:41:01 PM By: Fred Keeler PA-C Entered By: [name] on 01/30/2020 13:41:01 [place], [name] ([id number]) -------------------------------------------------------------------------------- Debridement Details Patient Name: [name] Date of Service: 01/30/2020 1:45 PM Medical Record Number: [id number] Patient Account Number: [id number] Date of Birth/Sex: 01-08-1946 (74 y.o. M) Treating RN: [name] Primary Care Provider: Frazier [name] Other Clinician: Referring Provider: Frazier [name] Treating Provider/Extender: [name], [name] [name] in Treatment: 9 Debridement Performed for Wound #1 Left Toe Great Assessment: Performed By: Physician Fred III, Natasha Burda E., PA-C Debridement Type: Debridement Severity of Tissue Pre Fat layer exposed Debridement: Level of Consciousness (Pre- Awake and Alert procedure): Pre-procedure Verification/Time Yes - 14:04 Out Taken: Start Time: 14:04 Pain Control: Lidocaine 4% Topical Solution Total Area Debrided (L x W): 0.3 (cm) x 0.2 (cm) = 0.06 (cm) Tissue and other material Viable, Non-Viable, Callus, Slough, Subcutaneous, Slough debrided: Level: Skin/Subcutaneous Tissue Debridement Description: Excisional Instrument: Curette Bleeding: Minimum Hemostasis Achieved: Pressure End Time: 14:08 Procedural Pain: 0 Post Procedural  Pain: 0 Response to Treatment: Procedure was tolerated well Level of Consciousness Awake and Alert (Post-procedure): Post Debridement Measurements of Total Wound Length: (cm) 0.3 Width: (cm) 0.3 Depth: (cm) 0.2 Volume: (cm) 0.014 Character of Wound/Ulcer Post Debridement: Improved Severity of Tissue Post Debridement: Fat layer exposed Post Procedure Diagnosis Same as Pre-procedure Electronic Signature(s) Signed: 01/30/2020 4:55:53 PM By: [name] Signed: 01/31/2020 1:07:50 AM By: Fred Keeler PA-C Entered By: [name] on 01/30/2020 14:07:32 [name] ([id number]) -------------------------------------------------------------------------------- HPI Details Patient Name: [name] Date of Service: 01/30/2020 1:45 PM Medical Record Number: [id number] Patient Account Number: [id number] Date of Birth/Sex: 1946-10-19 (74 y.o. M) Treating RN: [name] Primary Care Provider: Frazier [name] Other Clinician: Referring Provider: Frazier [name] Treating Provider/Extender: [name], Cailie Bosshart [name] in Treatment: 9 History of Present Illness HPI Description: 12/12/18 on evaluation today patient presents as a referral from his endocrinologist regarding issues that he has been having with his left great toe. Subsequently he has been seeing Dr. Cleda [name] and Dr. Cleda [name] has been the breeding the wound and in fact this was debrided this morning. The patient had an appointment there prior to coming here. Subsequently he states that even though he's been going for regular debridement after office things really do not seem to be showing signs of improvement unfortunately. He states that he is having some discomfort but nothing too significant. No fevers, chills, nausea, or vomiting noted at this time. The patient does have a history of hypertension, neuropathy, diabetes mellitus type II, and a history of stroke. Currently he's been using bacitracin on the toe and  utilizing a Band-Aid. He tells me he's had this wound for two years. He cannot remember the last time he had an x-ray. 12/19/18 on evaluation today patient appears to be doing well in regard to his plantar toe ulcer. He's been tolerating the dressing changes without complication. Fortunately there does not appear to be signs of infection at this time which is good news. No fever chills noted. 01/19/19 has been one month since I last  saw the patient as he was out of town. With that being said on evaluation today it does appear that he is going to require sharp debridement and I do believe that the total contact cast would benefit him as far as being applied at this time. He is in agreement that plan. 01/23/19 on evaluation today patient appears to be doing better in regard to his left great toe ulcer. He has been shown signs of improvement week by week and although this is slow it does seem to be doing fairly well which is good news. Overall very pleased in this regard. 01/31/19 on evaluation today patient presents for follow-up concerning his great toe ulcer. He was actually supposed to be here yesterday and he did come however he ended up having to leave due to his blood sugar dropping he states he just did not eat enough lunch. Fortunately seems to be doing much better today which is excellent news. Fortunately there's no evidence of infection at this time which is also good news. No fevers, chills, nausea, or vomiting noted at this time. Overall I feel like he is making excellent progress. 02/06/19 on evaluation today patient actually appears to be doing very well in regard to his plantar toe ulcer. He's been tolerating the dressing changes without complication. Fortunately there is no sign of infection at this time. Overall been very pleased with how things seem to be progressing. No fevers, chills, nausea, or vomiting noted at this time. 02/13/19 on evaluation today patient actually appears to be  doing excellent in regard to his toe ulcer which is almost completely close. Overall very pleased with that. There does not appear to be any signs of infection which is good news. Upon close inspection it appears that he has just a very slight opening still noted at the central portion of the toe although this is minimal. 02/16/19 on evaluation today patient is seen for early follow-up due to the fact that he tells me while at home he actually got up to go answer the door without putting on the walking boot part of his cast and subsequently damaged the total contact cast. It therefore comes in to have this removed and potentially replaced. Fortunately other than it given him a little bit of pain its far as pushing in on some areas there doesn't appear to be any injury once the cast was removed. 03/13/19 on evaluation today patient unfortunately presents for follow-up concerning his great toe ulcer on the left. He was discharged on 02/16/19 with the wound healed at that point. He tells me this remain so for several [name] or least a couple [name] before reopening. Fortunately there does not appear to be any signs of infection at this time. Nonetheless for the past week at least he's had the wound reopened and states it is been trying to keep pressure off of this but he has not been using the offloading shoe we previously gave him. Fortunately there's no evidence of infection at this time. No fevers, chills, nausea, or vomiting noted at this time. [name], [name] ([id number]) 03/17/19 on evaluation today patient actually appears to be doing rather well in regard to his toe also. Fact this appears to be significantly smaller this week even compared to what I saw last week on evaluation. He seems to have done very well for the offloading shoe and overall I'm very pleased with the progress that is made. It may be that he doesn't even need the cast to  get this to heal if he offloads this  appropriately. 03/24/19 on evaluation today patient actually appears to be doing well in regard to his plantar foot ulcer. He's been tolerating the dressing changes without complication the collagen does seem to be beneficial for him. With that being said he is gonna require some miles sharp debridement today to clear away some of the callous's around the edge of the wound as well as the minimal Slough noted on the surface of the wound. 03/31/19 on evaluation today patient appears to be doing well in regard to his plantar great toe ulcer. He has been tolerating the dressing changes without complication which is good news. Fortunately there does not appear to be any signs of active infection at this time. Overall very pleased with how things seem to be progressing. 04/11/19 on evaluation today patient's tools are appears to be doing in my pinion roughly about the same as were things that previous. Fortunately there does not appear to be signs of active infection at this time which is good news. With that being said he has not been experiencing any pain at this time which is good news there is also No fevers, chills, nausea, or vomiting noted at this time. 04/18/19 on evaluation today patient's wound actually appears to be doing fairly well today he did not even really have a lot of callous buildup. We have been debating on whether or not to reinitiate total contact cast. With that being said he seems to be doing fairly well this point with offloading in my pinion and again I think we need to come up with a way to get this healed that will also be sustainable for keeping it close. Again we were able to close it with the cast previous but again he has to be able to keep it such. For that reason we are working on trying to get him diabetic shoes he's waiting for the [place] to get in touch with the local company that has to measure him for these do not want to have a cast on at such a time as when they need  to actually measure him. 04/25/19 on evaluation today patient appears to be doing well in regard to his plantar foot specifically great toe ulcer. He's been tolerating the dressing changes without complication. Fortunately there's no signs of active infection at this time. Overall I'm very pleased with how things appear currently. 05/02/19 on evaluation today patient appears to actually be doing better in regard to his plantar foot ulcer. He shown signs of good improvement which is excellent news. She states he can walk better with the cast on the can with the offloading shoe that he had on previous. Fortunately there's no signs of active infection at this time. No fevers, chills, nausea, or vomiting noted at this time. 05/09/19 on evaluation today patient actually appears to be doing excellent. In fact he appears to be completely healed based on evaluation at this time. Fortunately there's no signs of infection and is having no discomfort. 06/08/19 this is a follow-up visit although the patient has been healed for almost a month but not quite. He tells me that in the past several days he noted some blood coming from his toe and he thought he should come in at this checked out. Fortunately he's having a pain. He also did get his custom orthotics shoes. With that being said he still appears to have developed this ulceration there is something he tells me that  the orthotic specialist stated they could do to the toes to try to help out in this regard although he did not know exactly what it was we may need to check with [facility]. 06/12/19 on evaluation today patient actually appears to be doing great in regard to his foot ulcer. The wound on his toes shown signs of excellent improvement which is great news. With that being said he is not completely close yet the cast has done wonders for him and he is definitely headed in the correct direction. No fevers, chills, nausea, or vomiting noted at  this time. 06/22/19 on evaluation today fortunately patient actually appears to be healing quite well and in fact appears to be completely healed this is excellent news. Fortunately there's no signs of active infection he's been tolerating the total contact cast without complication. Overall I'm pleased with the progress that has been made. He also has his new custom shoes he brought was with him today. 07/04/19 on evaluation today patient actually appears to be doing a little bit worse in regard his toe ulcer unfortunately this has yet again reopened. It has only been about a week and a half since I've last seen him I really did not expect this to reopen this quickly. Nonetheless I did discuss with him his normal routine in order to see what may be causing this. He has custom shoes. With that being said he apparently has not been using the custom shoes and even when he has used them he has not [name], [name]. ([id number]) even put the insert in there which is what was custom molded to him in order to appropriately offload high-pressure point areas. In fact his exact question to me was whether or not he needed to actually put those in they just came loose in the box and he had never installed them. Nonetheless it also came to light the he's been walking around in his bedroom slippers at home he tells me he does not go barefoot but he really has not been wearing his custom offloading/diabetic shoes. Nonetheless I do believe that this is why he continually reopens as far as the wound is concerned. I discussed this with the patient in great detail today for yet again although I previously had this discussion with him in the past. 07/11/19-Patient returns at 1 week for his left great toe wound which is actually doing really well, he has been using the insert to offload the toe and is very pleased with it. This looks like this is working out really well for him 07/18/19 on evaluation today patient  appears to be doing really about the same with regard to his ulcer on the right great toe. He's been tolerating the dressing changes without complication. Fortunately there's no signs of active infection at this time. With that being said I think that we're having a difficult time getting this to heal and I do believe that initiating treatment with the total contact cast to get this to close would be a good idea. The good news is he seems to be keeping this from worsening so hopefully that means that if we get it closed he can prevent this from reopening in the future. At this point my suggestion was that we go ahead and initiate treatment at both sites utilizing collagen. We will then subsequently put a piece of silver out and it between the toes of the right foot in the webbing between the fourth and fifth toe in order  to help keep things dry and allow this to hopefully close and we heal without complication. The patient is in agreement with plan. Subsequently I am hopeful that both wounds will heal quite quickly we have been very close now with the ankle and again the toe has reopened but fortunately doesn't appear to be to bad at this point. No antibiotics were prescribed today as they did not appear to be necessary. 07/25/19 on evaluation today patient actually appears to be doing excellent in regard to his great toe ulcer. In fact this really appears to be almost completely healed if indeed it is not in fact healed. With that being said there is still the eighth small pinpoint area that could be open although I tend to doubt it. Nonetheless it is something that I would like to continue to watch for more week I do think it would be beneficial for [place] to go ahead and continue with the cast for one more week before deciding to discharge him especially in light of the issues he's had in the past. Specifically with regard to the wound reopening rather quickly. 08/01/2019 upon evaluation today patient  actually appears to be doing well with regard to his great toe ulcer. He has been tolerating the dressing changes without complication. Fortunately we think appears to be completely healed at this time which is great news. I am very pleased with the progress up to this point. In fact he appears to be completely healed at this point. Readmission: 09/15/2019 patient presents today for reevaluation here in our clinic concerning issues that he is having with his left great toe as he did previous. We have not actually seen him since August 01, 2019. He had been doing very well until he noticed when walking into the bathroom with just the socks on the other night that he had a little bit of bleeding from the toe location. Subsequently he decided to make an appointment to come in as he did not want this to get significantly worse. There does not appear to be any signs of active infection he tells me as long as he was wearing his shoes he seemed to be doing okay is walking around not necessarily barefoot but just with socks on that I think may be his main problem at this time. 09/22/2019 on evaluation today patient actually appears to be doing quite well with regard to his great toe ulcer. This is making signs of good improvement even in the short amount of time we have been taking care of his toe since he came back. Is just been 1 week. Nonetheless the improvement in the wound size is excellent he also really has no depth and no evidence of infection which is great news. 09/29/2019 on evaluation today patient appears to be doing well with regard to his toe ulcer. This is showing signs of improvement in fact it was questioning whether or not today this actually was healed. However there did appear to be some callus covering the surface of the wound was removed there did appear to be a small pinpoint opening still in the middle of the wound although this is minimal. 10/06/2019 on evaluation today patient appears  to be doing excellent in regard to his toe ulcer which actually showed signs of complete Epithelization today. There is no evidence of active infection which is good news. Overall he has been tolerating the cast without complication but I think he is done with that as of now. Readmission: 11/28/2019  on evaluation today patient appears to be unfortunately here today again for another issue with his left great toe. He has been having some significant problems intermittently over the past year or really that I been seeing him. Were able to heal him and then subsequently things will get worse yet again. With that being said the patient tells me at this time that he [name], [name]. ([id number]) began to have issues with drainage noted 2 [name] ago. He has no pain but again he is never really had any discomfort. No fevers, chills, nausea, vomiting, or diarrhea. He has been using his offloading shoes he tells me. 12/05/19 on evaluation today patient actually appears to be doing quite well with regard to the wound on his toe. He really hasn't been using the [employer]asus offloading shoe for now. He tells me he actually forgot. With that being said there does not appear to be any signs of active infection at this time which is good news. No fevers, chills, nausea, or vomiting noted at this time. 12/12/2019 on evaluation today the patient appears to be doing quite well with regard to his toe ulcer. This does not seem to be showing any signs of worsening he has been using his custom shoes and that seems to be beneficial for him. Fortunately there is no sign of active infection at this point. No fevers, chills, nausea, vomiting, or diarrhea. 12/19/2019 on evaluation today patient appears to be doing well with regard to his toe ulcer. He has been tolerating dressing changes without complication he is wearing his offloading shoes and I do feel like they are helping at this point. Fortunately there is no signs of  active infection at this time. 12/29-Patient comes in at 1 week visit with regards to his left great toe plantar ulcer, this has been improving, his continue to wear his offloading shoes which are definitely helping he said he noted a very small trace of discharge at 1 point however wound looks stable 01/02/2020 on evaluation today patient appears to be doing well with regard to his toe ulcer as far as things at least not getting any worse than what they have been up to this point. With that being said he continues to have some significant callus buildup however which I think is preventing him from being able to heal due to the friction and pressure getting to this area. Fortunately there is no evidence of active infection at this point which is good news. No fevers, chills, nausea, vomiting, or diarrhea. The patient has no lower extremity edema. 01/09/2020 on evaluation today patient appears to be doing about the same with regard to his toe ulcer. He has been tolerating the dressing changes without complication. Fortunately there is no signs of active infection at this point. No fever chills noted no fevers, chills, nausea, vomiting, or diarrhea. He does have a significant improvement in the overall amount of callus buildup which is good news he has been wearing his shoes he tells me pretty much all the time. With that being said in general the wound does not significantly smaller but again last week he had a lot more callus that I had removed thereby I am not really concerned about the fact that he has more area open minimally compared to last week. Nonetheless if he does not start to see signs of improvement I really think the total contact cast is going to be up utmost importance. 01/16/2020 on evaluation today patient appears to be doing a  little worse due to blood blister on the left great toe on the lateral aspect which has arisen since last time I saw him. Fortunately there is no signs of active  infection at this time which is good news. Nonetheless he does not really note anything different that he is done that would have caused this. Fortunately there is no pain but again he does have neuropathy. 01/23/2020 on evaluation today patient appears to be doing well with regard to his toe ulcer. He has been tolerating the dressing changes without complication. Fortunately there is no signs of active infection I do feel like this is the best that have seen his toe in quite some time. He has been using the offloading shoe instead of his personal diabetic shoes. 01/29/2019 upon evaluation today patient appears to be doing excellent in regard to his foot from the standpoint of nothing getting worse although unfortunately he is also not getting a whole lot better. I do think he could potentially benefit from orthopedic felt as opposed to going into the cast as he still somewhat resistant to want to go back into the cast at this point. Nonetheless if we give him a week without and were still seeing no progress I think the cast is good to be the way to go next week for certain. He voiced understanding. With that being said I do believe that the orthopedic felt is probably something that he can find on [place] I am really not sure of anywhere locally that he can buy that. Electronic Signature(s) Signed: 01/30/2020 2:14:11 PM By: Fred Keeler PA-C Entered By: [name] on 01/30/2020 14:14:10 [place], [name] ([id number]) -------------------------------------------------------------------------------- Physical Exam Details Patient Name: [name] Date of Service: 01/30/2020 1:45 PM Medical Record Number: [id number] Patient Account Number: [id number] Date of Birth/Sex: 06-22-1946 (74 y.o. M) Treating RN: [name] Primary Care Provider: Frazier [name] Other Clinician: Referring Provider: Frazier [name] Treating Provider/Extender: Fred III, Aaden Buckman [name] in Treatment:  9 Constitutional Well-nourished and well-hydrated in no acute distress. Respiratory normal breathing without difficulty. Psychiatric this patient is able to make decisions and demonstrates good insight into disease process. Alert and Oriented x 3. pleasant and cooperative. Notes Patient's wound bed currently showed signs of good granulation at this time. Fortunately there does appear to be some decrease in the depth of the wound although the size really has not changed. He still has some callus around the edges of the wound that is can require sharp debridement today as well. I perform this without complication post debridement the wound bed appears to be doing much better which is great news. There was minimal bleeding noted. Electronic Signature(s) Signed: 01/30/2020 2:14:38 PM By: Fred Keeler PA-C Entered By: [name] on 01/30/2020 14:14:38 [place], [name] ([id number]) -------------------------------------------------------------------------------- Physician Orders Details Patient Name: [name] Date of Service: 01/30/2020 1:45 PM Medical Record Number: [id number] Patient Account Number: [id number] Date of Birth/Sex: 09-03-1946 (74 y.o. 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 Anesthetic (add  to Medication List) Wound #1 Left Toe Great o Topical Lidocaine 4% cream applied to  wound bed prior to debridement (In Clinic Only). Primary Wound Dressing Wound #1 Left Toe Great o Silver Collagen Secondary Dressing Wound #1 Left Toe Great o Conform/Kerlix o Other - Felt for offloading Dressing Change Frequency Wound #1 Left Toe Great o Change dressing every other day. Follow-up Appointments o Return Appointment in 1 week. Off-Loading Wound #1 Left Toe Great o Open toe surgical shoe with peg assist. Electronic Signature(s) Signed: 01/30/2020 4:55:53 PM By: [name] Signed: 01/31/2020 1:07:50 AM By: Fred Keeler PA-C Entered By: [name] on 01/30/2020 14:08:19 [place], [name] ([id number]) LATHON, BRAGANZA ([id number]) -------------------------------------------------------------------------------- Problem List Details Patient Name: [name], [name] Date of Service: 01/30/2020 1:45 PM Medical Record Number: [id number] Patient Account Number: [id number] Date of Birth/Sex: March 29, 1946 (74 y.o. 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 of other part of left foot with fat 11/28/2019 No Yes layer exposed I10 Essential (primary) hypertension 11/28/2019 No Yes E11.43 Type 2 diabetes mellitus with diabetic autonomic (poly) 11/28/2019 No Yes neuropathy Inactive Problems Resolved Problems Electronic Signature(s) Signed: 01/30/2020 1:40:55 PM By: Fred Keeler PA-C Entered By: [name] on 01/30/2020 13:40:55 [place], [name] ([id number]) -------------------------------------------------------------------------------- Progress Note Details Patient Name: [name] Date of Service: 01/30/2020 1:45 PM Medical Record Number: [id number] Patient Account Number: [id number] Date of Birth/Sex: 1946-08-31 (74 y.o. M) Treating RN: [name] Primary Care Provider: Frazier [name] Other Clinician: Referring Provider: Frazier [name] Treating Provider/Extender: [name], Stephanie Mcglone [name] in Treatment: 9 Subjective Chief Complaint Information obtained from Patient Left great toe ulcer History of Present Illness (HPI) 12/12/18 on evaluation today patient presents as a referral from his endocrinologist regarding issues that he has been having with his left great toe. Subsequently he has been seeing Dr. Cleda [name] and Dr. Cleda [name] has been the breeding the wound and in fact this was debrided this morning. The patient had an appointment there prior to coming here. Subsequently he states that even though he's been going for regular debridement after office things really do not seem to be showing signs of improvement unfortunately. He states that he is having some discomfort but nothing too significant. No fevers, chills, nausea, or vomiting noted at this time. The patient does have a history of hypertension, neuropathy, diabetes mellitus type II, and a history of stroke. Currently he's been using bacitracin on the toe and utilizing a Band-Aid. He tells me he's had this wound for two years. He cannot remember the last time he had an x-ray. 12/19/18 on evaluation today patient appears to be doing well in regard to his plantar toe ulcer. He's been tolerating the dressing changes without complication. Fortunately there does not appear to be signs of infection at this time which is good news. No fever chills noted. 01/19/19 has been one month since I last saw the patient as he was out of town. With that being said on evaluation today it does appear that he is going to require sharp debridement and I do believe that the total contact cast would benefit him as far as being  applied at this time. He is in agreement that plan. 01/23/19 on evaluation today patient appears  to be doing better in regard to his left great toe ulcer. He has been shown signs of improvement week by week and although this is slow it does seem to be doing fairly well which is good news. Overall very pleased in this regard. 01/31/19 on evaluation today patient presents for follow-up concerning his great toe ulcer. He was actually supposed to be here yesterday and he did come however he ended up having to leave due to his blood sugar dropping he states he just did not eat enough lunch. Fortunately seems to be doing much better today which is excellent news. Fortunately there's no evidence of infection at this time which is also good news. No fevers, chills, nausea, or vomiting noted at this time. Overall I feel like he is making excellent progress. 02/06/19 on evaluation today patient actually appears to be doing very well in regard to his plantar toe ulcer. He's been tolerating the dressing changes without complication. Fortunately there is no sign of infection at this time. Overall been very pleased with how things seem to be progressing. No fevers, chills, nausea, or vomiting noted at this time. 02/13/19 on evaluation today patient actually appears to be doing excellent in regard to his toe ulcer which is almost completely close. Overall very pleased with that. There does not appear to be any signs of infection which is good news. Upon close inspection it appears that he has just a very slight opening still noted at the central portion of the toe although this is minimal. 02/16/19 on evaluation today patient is seen for early follow-up due to the fact that he tells me while at home he actually got up to go answer the door without putting on the walking boot part of his cast and subsequently damaged the total contact cast. It therefore comes in to have this removed and potentially replaced. Fortunately  other than it given him a little bit of pain its far as pushing in on some areas there doesn't appear to be any injury once the cast was removed. [name], [name] ([id number]) 03/13/19 on evaluation today patient unfortunately presents for follow-up concerning his great toe ulcer on the left. He was discharged on 02/16/19 with the wound healed at that point. He tells me this remain so for several [name] or least a couple [name] before reopening. Fortunately there does not appear to be any signs of infection at this time. Nonetheless for the past week at least he's had the wound reopened and states it is been trying to keep pressure off of this but he has not been using the offloading shoe we previously gave him. Fortunately there's no evidence of infection at this time. No fevers, chills, nausea, or vomiting noted at this time. 03/17/19 on evaluation today patient actually appears to be doing rather well in regard to his toe also. Fact this appears to be significantly smaller this week even compared to what I saw last week on evaluation. He seems to have done very well for the offloading shoe and overall I'm very pleased with the progress that is made. It may be that he doesn't even need the cast to get this to heal if he offloads this appropriately. 03/24/19 on evaluation today patient actually appears to be doing well in regard to his plantar foot ulcer. He's been tolerating the dressing changes without complication the collagen does seem to be beneficial for him. With that being said he is gonna require some miles sharp debridement today to clear  away some of the callous's around the edge of the wound as well as the minimal [facility] noted on the surface of the wound. 03/31/19 on evaluation today patient appears to be doing well in regard to his plantar great toe ulcer. He has been tolerating the dressing changes without complication which is good news. Fortunately there does not appear to be any  signs of active infection at this time. Overall very pleased with how things seem to be progressing. 04/11/19 on evaluation today patient's tools are appears to be doing in my pinion roughly about the same as were things that previous. Fortunately there does not appear to be signs of active infection at this time which is good news. With that being said he has not been experiencing any pain at this time which is good news there is also No fevers, chills, nausea, or vomiting noted at this time. 04/18/19 on evaluation today patient's wound actually appears to be doing fairly well today he did not even really have a lot of callous buildup. We have been debating on whether or not to reinitiate total contact cast. With that being said he seems to be doing fairly well this point with offloading in my pinion and again I think we need to come up with a way to get this healed that will also be sustainable for keeping it close. Again we were able to close it with the cast previous but again he has to be able to keep it such. For that reason we are working on trying to get him diabetic shoes he's waiting for the [place] to get in touch with the local company that has to measure him for these do not want to have a cast on at such a time as when they need to actually measure him. 04/25/19 on evaluation today patient appears to be doing well in regard to his plantar foot specifically great toe ulcer. He's been tolerating the dressing changes without complication. Fortunately there's no signs of active infection at this time. Overall I'm very pleased with how things appear currently. 05/02/19 on evaluation today patient appears to actually be doing better in regard to his plantar foot ulcer. He shown signs of good improvement which is excellent news. She states he can walk better with the cast on the can with the offloading shoe that he had on previous. Fortunately there's no signs of active infection at this time. No  fevers, chills, nausea, or vomiting noted at this time. 05/09/19 on evaluation today patient actually appears to be doing excellent. In fact he appears to be completely healed based on evaluation at this time. Fortunately there's no signs of infection and is having no discomfort. 06/08/19 this is a follow-up visit although the patient has been healed for almost a month but not quite. He tells me that in the past several days he noted some blood coming from his toe and he thought he should come in at this checked out. Fortunately he's having a pain. He also did get his custom orthotics shoes. With that being said he still appears to have developed this ulceration there is something he tells me that the orthotic specialist stated they could do to the toes to try to help out in this regard although he did not know exactly what it was we may need to check with [facility]. 06/12/19 on evaluation today patient actually appears to be doing great in regard to his foot ulcer. The wound on his toes shown signs  of excellent improvement which is great news. With that being said he is not completely close yet the cast has done wonders for him and he is definitely headed in the correct direction. No fevers, chills, nausea, or vomiting noted at this time. 06/22/19 on evaluation today fortunately patient actually appears to be healing quite well and in fact appears to be completely healed this is excellent news. Fortunately there's no signs of active infection he's been tolerating the total contact cast without complication. Overall I'm pleased with the progress that has been made. He also has his new custom shoes he brought was [name], [name] ([id number]) with him today. 07/04/19 on evaluation today patient actually appears to be doing a little bit worse in regard his toe ulcer unfortunately this has yet again reopened. It has only been about a week and a half since I've last seen him I really did not expect  this to reopen this quickly. Nonetheless I did discuss with him his normal routine in order to see what may be causing this. He has custom shoes. With that being said he apparently has not been using the custom shoes and even when he has used them he has not even put the insert in there which is what was custom molded to him in order to appropriately offload high-pressure point areas. In fact his exact question to me was whether or not he needed to actually put those in they just came loose in the box and he had never installed them. Nonetheless it also came to light the he's been walking around in his bedroom slippers at home he tells me he does not go barefoot but he really has not been wearing his custom offloading/diabetic shoes. Nonetheless I do believe that this is why he continually reopens as far as the wound is concerned. I discussed this with the patient in great detail today for yet again although I previously had this discussion with him in the past. 07/11/19-Patient returns at 1 week for his left great toe wound which is actually doing really well, he has been using the insert to offload the toe and is very pleased with it. This looks like this is working out really well for him 07/18/19 on evaluation today patient appears to be doing really about the same with regard to his ulcer on the right great toe. He's been tolerating the dressing changes without complication. Fortunately there's no signs of active infection at this time. With that being said I think that we're having a difficult time getting this to heal and I do believe that initiating treatment with the total contact cast to get this to close would be a good idea. The good news is he seems to be keeping this from worsening so hopefully that means that if we get it closed he can prevent this from reopening in the future. At this point my suggestion was that we go ahead and initiate treatment at both sites utilizing collagen. We  will then subsequently put a piece of silver out and it between the toes of the right foot in the webbing between the fourth and fifth toe in order to help keep things dry and allow this to hopefully close and we heal without complication. The patient is in agreement with plan. Subsequently I am hopeful that both wounds will heal quite quickly we have been very close now with the ankle and again the toe has reopened but fortunately doesn't appear to be to bad at this  point. No antibiotics were prescribed today as they did not appear to be necessary. 07/25/19 on evaluation today patient actually appears to be doing excellent in regard to his great toe ulcer. In fact this really appears to be almost completely healed if indeed it is not in fact healed. With that being said there is still the eighth small pinpoint area that could be open although I tend to doubt it. Nonetheless it is something that I would like to continue to watch for more week I do think it would be beneficial for [place] to go ahead and continue with the cast for one more week before deciding to discharge him especially in light of the issues he's had in the past. Specifically with regard to the wound reopening rather quickly. 08/01/2019 upon evaluation today patient actually appears to be doing well with regard to his great toe ulcer. He has been tolerating the dressing changes without complication. Fortunately we think appears to be completely healed at this time which is great news. I am very pleased with the progress up to this point. In fact he appears to be completely healed at this point. Readmission: 09/15/2019 patient presents today for reevaluation here in our clinic concerning issues that he is having with his left great toe as he did previous. We have not actually seen him since August 01, 2019. He had been doing very well until he noticed when walking into the bathroom with just the socks on the other night that he had a little  bit of bleeding from the toe location. Subsequently he decided to make an appointment to come in as he did not want this to get significantly worse. There does not appear to be any signs of active infection he tells me as long as he was wearing his shoes he seemed to be doing okay is walking around not necessarily barefoot but just with socks on that I think may be his main problem at this time. 09/22/2019 on evaluation today patient actually appears to be doing quite well with regard to his great toe ulcer. This is making signs of good improvement even in the short amount of time we have been taking care of his toe since he came back. Is just been 1 week. Nonetheless the improvement in the wound size is excellent he also really has no depth and no evidence of infection which is great news. 09/29/2019 on evaluation today patient appears to be doing well with regard to his toe ulcer. This is showing signs of improvement in fact it was questioning whether or not today this actually was healed. However there did appear to be some callus covering the surface of the wound was removed there did appear to be a small pinpoint opening still in the middle of the wound although this is minimal. 10/06/2019 on evaluation today patient appears to be doing excellent in regard to his toe ulcer which actually showed signs of complete Epithelization today. There is no evidence of active infection which is good news. Overall he has been tolerating the cast without complication but I think he is done with that as of now. [name], [name] ([id number]) Readmission: 11/28/2019 on evaluation today patient appears to be unfortunately here today again for another issue with his left great toe. He has been having some significant problems intermittently over the past year or really that I been seeing him. Were able to heal him and then subsequently things will get worse yet again. With that being said  the patient tells me  at this time that he began to have issues with drainage noted 2 [name] ago. He has no pain but again he is never really had any discomfort. No fevers, chills, nausea, vomiting, or diarrhea. He has been using his offloading shoes he tells me. 12/05/19 on evaluation today patient actually appears to be doing quite well with regard to the wound on his toe. He really hasn't been using the [employer]asus offloading shoe for now. He tells me he actually forgot. With that being said there does not appear to be any signs of active infection at this time which is good news. No fevers, chills, nausea, or vomiting noted at this time. 12/12/2019 on evaluation today the patient appears to be doing quite well with regard to his toe ulcer. This does not seem to be showing any signs of worsening he has been using his custom shoes and that seems to be beneficial for him. Fortunately there is no sign of active infection at this point. No fevers, chills, nausea, vomiting, or diarrhea. 12/19/2019 on evaluation today patient appears to be doing well with regard to his toe ulcer. He has been tolerating dressing changes without complication he is wearing his offloading shoes and I do feel like they are helping at this point. Fortunately there is no signs of active infection at this time. 12/29-Patient comes in at 1 week visit with regards to his left great toe plantar ulcer, this has been improving, his continue to wear his offloading shoes which are definitely helping he said he noted a very small trace of discharge at 1 point however wound looks stable 01/02/2020 on evaluation today patient appears to be doing well with regard to his toe ulcer as far as things at least not getting any worse than what they have been up to this point. With that being said he continues to have some significant callus buildup however which I think is preventing him from being able to heal due to the friction and pressure getting to this  area. Fortunately there is no evidence of active infection at this point which is good news. No fevers, chills, nausea, vomiting, or diarrhea. The patient has no lower extremity edema. 01/09/2020 on evaluation today patient appears to be doing about the same with regard to his toe ulcer. He has been tolerating the dressing changes without complication. Fortunately there is no signs of active infection at this point. No fever chills noted no fevers, chills, nausea, vomiting, or diarrhea. He does have a significant improvement in the overall amount of callus buildup which is good news he has been wearing his shoes he tells me pretty much all the time. With that being said in general the wound does not significantly smaller but again last week he had a lot more callus that I had removed thereby I am not really concerned about the fact that he has more area open minimally compared to last week. Nonetheless if he does not start to see signs of improvement I really think the total contact cast is going to be up utmost importance. 01/16/2020 on evaluation today patient appears to be doing a little worse due to blood blister on the left great toe on the lateral aspect which has arisen since last time I saw him. Fortunately there is no signs of active infection at this time which is good news. Nonetheless he does not really note anything different that he is done that would have caused this. Fortunately there is  no pain but again he does have neuropathy. 01/23/2020 on evaluation today patient appears to be doing well with regard to his toe ulcer. He has been tolerating the dressing changes without complication. Fortunately there is no signs of active infection I do feel like this is the best that have seen his toe in quite some time. He has been using the offloading shoe instead of his personal diabetic shoes. 01/29/2019 upon evaluation today patient appears to be doing excellent in regard to his foot from the  standpoint of nothing getting worse although unfortunately he is also not getting a whole lot better. I do think he could potentially benefit from orthopedic felt as opposed to going into the cast as he still somewhat resistant to want to go back into the cast at this point. Nonetheless if we give him a week without and were still seeing no progress I think the cast is good to be the way to go next week for certain. He voiced understanding. With that being said I do believe that the orthopedic felt is probably something that he can find on [employer] I am really not sure of anywhere locally that he can buy that. [name], [name] ([id number]) Objective Constitutional Well-nourished and well-hydrated in no acute distress. Vitals Time Taken: 1:43 PM, Height: 72 in, Weight: 254 lbs, BMI: 34.4, Temperature: 98.4 F, Pulse: 74 bpm, Respiratory Rate: 16 breaths/min, Blood Pressure: 148/64 mmHg. Respiratory normal breathing without difficulty. Psychiatric this patient is able to make decisions and demonstrates good insight into disease process. Alert and Oriented x 3. pleasant and cooperative. General Notes: Patient's wound bed currently showed signs of good granulation at this time. Fortunately there does appear to be some decrease in the depth of the wound although the size really has not changed. He still has some callus around the edges of the wound that is can require sharp debridement today as well. I perform this without complication post debridement the wound bed appears to be doing much better which is great news. There was minimal bleeding noted. Integumentary (Hair, Skin) Wound #1 status is Open. Original cause of wound was Gradually Appeared. The wound is located on the Left Toe Great. The wound measures 0.3cm length x 0.2cm width x 0.1cm depth; 0.047cm^2 area and 0.005cm^3 volume. There is Fat Layer (Subcutaneous Tissue) Exposed exposed. There is a medium amount of serosanguineous  drainage noted. The wound margin is flat and intact. There is large (67-100%) granulation within the wound bed. There is a small (1-33%) amount of necrotic tissue within the wound bed including Eschar and Adherent Slough. Assessment Active Problems ICD-10 Type 2 diabetes mellitus with foot ulcer Non-pressure chronic ulcer of other part of left foot with fat layer exposed Essential (primary) hypertension Type 2 diabetes mellitus with diabetic autonomic (poly)neuropathy Procedures Wound #1 Pre-procedure diagnosis of Wound #1 is a Diabetic Wound/Ulcer of the Lower Extremity located on the Left Toe Great .Severity of Tissue Pre Debridement is: Fat layer exposed. There was a Excisional Skin/Subcutaneous Tissue Debridement with a total area of 0.06 sq cm performed by Fred III, Keveon Amsler E., PA-C. With the following instrument(s): Curette to remove Viable and Non-Viable tissue/material. Material removed includes Callus, Subcutaneous Tissue, and Slough after achieving pain control using Lidocaine 4% Topical Solution. No specimens were taken. A time out was [name], [name] ([id number]) conducted at 14:04, prior to the start of the procedure. A Minimum amount of bleeding was controlled with Pressure. The procedure was tolerated well with a pain level  of 0 throughout and a pain level of 0 following the procedure. Post Debridement Measurements: 0.3cm length x 0.3cm width x 0.2cm depth; 0.014cm^3 volume. Character of Wound/Ulcer Post Debridement is improved. Severity of Tissue Post Debridement is: Fat layer exposed. Post procedure Diagnosis Wound #1: Same as Pre-Procedure Plan Wound Cleansing: Wound #1 Left Toe Great: May Shower, gently pat wound dry prior to applying new dressing. Anesthetic (add to Medication List): Wound #1 Left Toe Great: Topical Lidocaine 4% cream applied to wound bed prior to debridement (In Clinic Only). Primary Wound Dressing: Wound #1 Left Toe Great: Silver  Collagen Secondary Dressing: Wound #1 Left Toe Great: Conform/Kerlix Other - Felt for offloading Dressing Change Frequency: Wound #1 Left Toe Great: Change dressing every other day. Follow-up Appointments: Return Appointment in 1 week. Off-Loading: Wound #1 Left Toe Great: Open toe surgical shoe with peg assist. 1. I am in a suggest that we go ahead and continue with the silver collagen dressing that seems to be beneficial. I do not think the dressing is the issue here I think offloading is the issue. 2. I am in a suggest that we try orthopedic offloading felt for the next week he does need to use quarter inch felt which can be ordered on [place] is good to see about having his daughter do this for him. If he is not showing signs of improvement by next week I think we need to proceed with a total contact cast. 3. I do think that the patient likely is going to benefit from a total contact cast to follow this fails I did suggest to him today that this would be something I would initiate next week for certain if he is not doing better. We will see patient back for reevaluation in 1 week here in the clinic. If anything worsens or changes patient will contact our office for additional recommendations. Electronic Signature(s) Signed: 01/30/2020 2:17:01 PM By: Fred Keeler PA-C Entered By: [name] on 01/30/2020 14:17:00 [name], [name] ([id number]) -------------------------------------------------------------------------------- SuperBill Details Patient Name: [name] Date of Service: 01/30/2020 Medical Record Number: [id number] Patient Account Number: [id number] Date of Birth/Sex: 06-15-1946 (74 y.o. M) Treating RN: [name] Primary Care Provider: Frazier [name] Other Clinician: Referring Provider: Frazier [name] Treating Provider/Extender: [name], Jamario Colina [name] in Treatment: 9 Diagnosis Coding ICD-10 Codes Code Description E11.621 Type 2  diabetes mellitus with foot ulcer L97.522 Non-pressure chronic ulcer of other part of left foot with fat layer exposed I10 Essential (primary) hypertension E11.43 Type 2 diabetes mellitus with diabetic autonomic (poly)neuropathy Facility Procedures CPT4 Code: [id number] Description: [id number] - DEB SUBQ TISSUE 20 SQ CM/< ICD-10 Diagnosis Description L97.522 Non-pressure chronic ulcer of other part of left foot with fat Modifier: layer exposed Quantity: 1 Physician Procedures CPT4 Code: [id number] Description: 11042 - WC PHYS SUBQ TISS 20 SQ CM ICD-10 Diagnosis Description L97.522 Non-pressure chronic ulcer of other part of left foot with fat Modifier: layer exposed Quantity: 1 Electronic Signature(s) Signed: 01/30/2020 2:17:17 PM By: Fred Keeler PA-C Entered By: [name] on 01/30/2020 14:17:16

## 2020-01-30 NOTE — Progress Notes (Addendum)
ANDERSON, LEPERA ([id number]) Visit Report for 01/30/2020 Arrival Information Details Patient Name: [name], [name]. Date of Service: 01/30/2020 1:45 PM Medical Record Number: [id number] Patient Account Number: [id number] Date of Birth/Sex: 01/04/1946 (74 y.o. M) Treating RN: [name] Primary Care [name]: [name] Other Clinician: Referring [name]: [name] Treating [name]/Extender: [name] Weeks in Treatment: 9 Visit Information History Since Last Visit Added or deleted any medications: No Patient Arrived: Ambulatory Any new allergies or adverse reactions: No Arrival Time: 13:42 Had a fall or experienced change in No Accompanied By: self activities of daily living that may affect Transfer Assistance: None risk of falls: Signs or symptoms of abuse/neglect since last visito No Hospitalized since last visit: No Has Dressing in Place as Prescribed: Yes Pain Present Now: No Electronic Signature(s) Signed: 01/31/2020 9:28:55 AM By: [name] Entered By: [name] on 01/30/2020 13:43:16 [place], [name] ([id number]) -------------------------------------------------------------------------------- Encounter Discharge Information Details Patient Name: [name]. Date of Service: 01/30/2020 1:45 PM Medical Record Number: [id number] Patient Account Number: [id number] Date of Birth/Sex: 1946/10/01 (74 y.o. M) Treating RN: [name] Primary Care Amour Trigg: [name] Other Clinician: Referring Tinaya Ceballos: [name] Treating Lewie Deman/Extender: [name] Weeks in Treatment: 9 Encounter Discharge Information Items Post Procedure Vitals Discharge Condition: Stable Temperature (F): 98.4 Ambulatory Status: Ambulatory Pulse (bpm): 74 Discharge Destination: Home Respiratory Rate (breaths/min): 16 Transportation: Private Auto Blood Pressure (mmHg): 148/64 Accompanied By: self Schedule Follow-up  Appointment: Yes Clinical Summary of Care: Electronic Signature(s) Signed: 01/30/2020 4:55:53 PM By: [name] Entered By: [name] on 01/30/2020 14:10:54 [place], [name] ([id number]) -------------------------------------------------------------------------------- Lower Extremity Assessment Details Patient Name: [name]. Date of Service: 01/30/2020 1:45 PM Medical Record Number: [id number] Patient Account Number: [id number] Date of Birth/Sex: [birth year] (74 y.o. M) Treating RN: [name] Primary Care Syncere Eble: [name] Other Clinician: Referring Aiza Vollrath: [name] Treating Laverle Pillard/Extender: STONE III, [name] Weeks in Treatment: 9 Edema Assessment Assessed: [Left: No] [Right: No] Edema: [Left: N] [Right: o] Vascular Assessment Pulses: Dorsalis Pedis Palpable: [Left:Yes] Electronic Signature(s) Signed: 01/31/2020 9:28:55 AM By: [name] Entered By: [name] on 01/30/2020 13:45:52 [place], [name] ([id number]) -------------------------------------------------------------------------------- Multi Wound Chart Details Patient Name: [name]. Date of Service: 01/30/2020 1:45 PM Medical Record Number: [id number] Patient Account Number: [id number] Date of Birth/Sex: 24-Jun-1946 (74 y.o. M) Treating RN: [name] Primary Care [name]: [name] Other Clinician: Referring Ozil [name]: [name] Treating [name]/Extender: [name] Weeks in Treatment: 9 Vital Signs Height(in): 72 Pulse(bpm): 74 Weight(lbs): 254 Blood Pressure(mmHg): 148/64 Body Mass Index(BMI): 34 Temperature(F): 98.4 Respiratory Rate 16 (breaths/min): Photos: [N/A:N/A] Wound Location: Left Toe Great N/A N/A Wounding Event: Gradually Appeared N/A N/A Primary Etiology: Diabetic Wound/Ulcer of the N/A N/A Lower Extremity Comorbid History: Hypertension, Type II Diabetes N/A N/A Date Acquired: 11/14/2019 N/A  N/A Weeks of Treatment: 9 N/A N/A Wound Status: Open N/A N/A Measurements L x W x D 0.3x0.2x0.1 N/A N/A (cm) Area (cm) : 0.047 N/A N/A Volume (cm) : 0.005 N/A N/A % Reduction in Area: 25.40% N/A N/A % Reduction in Volume: 86.80% N/A N/A Classification: Grade 1 N/A N/A Exudate Amount: Medium N/A N/A Exudate Type: Serosanguineous N/A N/A Exudate Color: red, brown N/A N/A Wound Margin: Flat and Intact N/A N/A Granulation Amount: Large (67-100%) N/A N/A Necrotic Amount: Small (1-33%) N/A N/A Necrotic Tissue: Eschar, Adherent Slough N/A N/A Exposed Structures: Fat Layer (Subcutaneous N/A N/A Tissue) Exposed: Yes Fascia: No Tendon: No Muscle: No Joint: No Bone: No [name], [name]  L. ([id number]) Epithelialization: Medium (34-66%) N/A N/A Treatment Notes Electronic Signature(s) Signed: 01/30/2020 4:55:53 PM By: [name] Entered By: [name] on 01/30/2020 14:03:19 [name], [name] ([id number]) -------------------------------------------------------------------------------- [name] Details Patient Name: [name] Date of Service: 01/30/2020 1:45 PM Medical Record Number: [id number] Patient Account Number: [id number] Date of Birth/Sex: [birth year] (74 y.o. M) Treating RN: [name] Primary Care [name]: [name] Other Clinician: Referring Kaige Whistler: [name] Treating [name]/Extender: [name] Weeks in Treatment: 9 Active Inactive Abuse / Safety / Falls / Self Care Management Nursing Diagnoses: Potential for falls Goals: Patient will not experience any injury related to falls Date Initiated: 11/28/2019 Target Resolution Date: 03/09/2020 Goal Status: Active Interventions: Assess fall risk on admission and as needed Notes: Nutrition Nursing Diagnoses: Potential for alteratiion in Nutrition/Potential for imbalanced nutrition Goals: Patient/caregiver agrees to and verbalizes understanding of  need to use nutritional supplements and/or vitamins as prescribed Date Initiated: 11/28/2019 Target Resolution Date: 03/09/2020 Goal Status: Active Interventions: Assess patient nutrition upon admission and as needed per policy Notes: Wound/Skin Impairment Nursing Diagnoses: Impaired tissue integrity Goals: Ulcer/skin breakdown will heal within 14 weeks Date Initiated: 11/28/2019 Target Resolution Date: 03/09/2020 Goal Status: Active Interventions: Assess patient/caregiver ability to obtain necessary supplies [name], [name] ([id number]) Assess patient/caregiver ability to perform ulcer/skin care regimen upon admission and as needed Assess ulceration(s) every visit Notes: Electronic Signature(s) Signed: 01/30/2020 4:55:53 PM By: [name] Entered By: [name] on 01/30/2020 14:03:07 [name] ([id number]) -------------------------------------------------------------------------------- Pain Assessment Details Patient Name: [name]. Date of Service: 01/30/2020 1:45 PM Medical Record Number: [id number] Patient Account Number: [id number] Date of Birth/Sex: April 18, 1946 (74 y.o. M) Treating RN: [name] Primary Care Camani Sesay: [name] Other Clinician: Referring Nazifa Trinka: [name] Treating Asharia Lotter/Extender: [name] Weeks in Treatment: 9 Active Problems Location of Pain Severity and Description of Pain Patient Has Paino No Site Locations Pain Management and Medication Current Pain Management: Electronic Signature(s) Signed: 01/31/2020 9:28:55 AM By: [name] Entered By: [name] on 01/30/2020 13:43:20 [place], [name] ([id number]) -------------------------------------------------------------------------------- Patient/Caregiver Education Details Patient Name: [name]. Date of Service: 01/30/2020 1:45 PM Medical Record Number: [id number] Patient Account Number: [id number] Date of  Birth/Gender: 02-Aug-1946 (74 y.o. M) Treating RN: [name] Primary Care Physician: [name] Other Clinician: Referring Physician: Frazier [name] Treating Physician/Extender: [name] Weeks in Treatment: 9 Education Assessment Education Provided To: Patient Education Topics Provided Offloading: Handouts: Other: felt for offloading Methods: Explain/Verbal Responses: State content correctly Electronic Signature(s) Signed: 01/30/2020 4:55:53 PM By: [name] Entered By: [name] on 01/30/2020 14:09:33 [place], [name] ([id number]) -------------------------------------------------------------------------------- Wound Assessment Details Patient Name: [name]. Date of Service: 01/30/2020 1:45 PM Medical Record Number: [id number] Patient Account Number: [id number] Date of Birth/Sex: 1946-05-04 (74 y.o. M) Treating RN: [name] Primary Care Liv Rallis: [name] Other Clinician: Referring Jesslynn Kruck: [name] Treating Devina Bezold/Extender: [name] Weeks in Treatment: 9 Wound Status Wound Number: 1 Primary Etiology: Diabetic Wound/Ulcer of the Lower Extremity Wound Location: Left Toe Great Wound Status: Open Wounding Event: Gradually Appeared Comorbid Hypertension, Type II Diabetes Date Acquired: 11/14/2019 History: Weeks Of Treatment: 9 Clustered Wound: No Photos Wound Measurements Length: (cm) 0.3 Width: (cm) 0.2 Depth: (cm) 0.1 Area: (cm) 0.047 Volume: (cm) 0.005 % Reduction in Area: 25.4% % Reduction in Volume: 86.8% Epithelialization: Medium (34-66%) Wound Description Classification: Grade 1 Foul Odor Aft Wound Margin: Flat and Intact Slough/Fibrin Exudate Amount: Medium Exudate Type: Serosanguineous Exudate Color: red, brown er  Cleansing: No o Yes Wound Bed Granulation Amount: Large (67-100%) Exposed Structure Necrotic Amount: Small (1-33%) Fascia Exposed: No Necrotic Quality:  Eschar, Adherent Slough Fat Layer (Subcutaneous Tissue) Exposed: Yes Tendon Exposed: No Muscle Exposed: No Joint Exposed: No Bone Exposed: No Treatment Notes [name], [name] ([id number]) Wound #1 (Left Toe Great) Notes prisma, felt and conform Electronic Signature(s) Signed: 01/31/2020 9:28:55 AM By: [name] Entered By: [name] on 01/30/2020 13:43:58 [place], [name] ([id number]) -------------------------------------------------------------------------------- Vitals Details Patient Name: [name]. Date of Service: 01/30/2020 1:45 PM Medical Record Number: [id number] Patient Account Number: [id number] Date of Birth/Sex: [birth year] (74 y.o. M) Treating RN: [name] Primary Care Preslee Regas: [name] Other Clinician: Referring Tajia Szeliga: [name] Treating Shabreka Coulon/Extender: [name] Weeks in Treatment: 9 Vital Signs Time Taken: 13:43 Temperature (F): 98.4 Height (in): 72 Pulse (bpm): 74 Weight (lbs): 254 Respiratory Rate (breaths/min): 16 Body Mass Index (BMI): 34.4 Blood Pressure (mmHg): 148/64 Reference Range: 80 - 120 mg / dl Electronic Signature(s) Signed: 01/31/2020 9:28:55 AM By: [name] Entered By: [name] on 01/30/2020 13:45:36

## 2020-02-06 ENCOUNTER — Ambulatory Visit: Payer: Non-veteran care | Admitting: Physician Assistant

## 2020-02-13 ENCOUNTER — Other Ambulatory Visit: Payer: Self-pay

## 2020-02-13 ENCOUNTER — Encounter: Payer: Medicare Other | Admitting: Physician Assistant

## 2020-02-13 DIAGNOSIS — E11621 Type 2 diabetes mellitus with foot ulcer: Secondary | ICD-10-CM | POA: Diagnosis not present

## 2020-02-13 NOTE — Progress Notes (Addendum)
ADONUS, KEAVENEY ([id number]) Visit Report for 02/13/2020 Chief Complaint Document Details Patient Name: [name], [name]. Date of Service: 02/13/2020 1:45 PM Medical Record Number: [id number] Patient Account Number: [id number] Date of Birth/Sex: 1946-05-26 (74 y.o. M) Treating RN: Montey Hora Primary Care Provider: Frazier Richards Other Clinician: Referring Provider: Frazier Richards Treating Provider/Extender: Melburn Hake, Sophiea Ueda Weeks in Treatment: 11 Information Obtained from: Patient Chief Complaint Left great toe ulcer Electronic Signature(s) Signed: 02/13/2020 1:52:01 PM By: Worthy Keeler PA-C Entered By: Worthy Keeler on 02/13/2020 13:52:00 [name], [name] ([id number]) -------------------------------------------------------------------------------- HPI Details Patient Name: [name] Date of Service: 02/13/2020 1:45 PM Medical Record Number: [id number] Patient Account Number: [id number] Date of Birth/Sex: October 17, 1946 (74 y.o. M) Treating RN: Montey Hora Primary Care Provider: Frazier Richards Other Clinician: Referring Provider: Frazier Richards Treating Provider/Extender: Melburn Hake, Sophonie Goforth Weeks in Treatment: 11 History of Present Illness HPI Description: 12/12/18 on evaluation today patient presents as a referral from his endocrinologist regarding issues that he has been having with his left great toe. Subsequently he has been seeing Dr. Cleda Mccreedy and Dr. Cleda Mccreedy has been the breeding the wound and in fact this was debrided this morning. The patient had an appointment there prior to coming here. Subsequently he states that even though he's been going for regular debridement after office things really do not seem to be showing signs of improvement unfortunately. He states that he is having some discomfort but nothing too significant. No fevers, chills, nausea, or vomiting noted at this time. The patient does have a history of hypertension,  neuropathy, diabetes mellitus type II, and a history of stroke. Currently he's been using bacitracin on the toe and utilizing a Band-Aid. He tells me he's had this wound for two years. He cannot remember the last time he had an x-ray. 12/19/18 on evaluation today patient appears to be doing well in regard to his plantar toe ulcer. He's been tolerating the dressing changes without complication. Fortunately there does not appear to be signs of infection at this time which is good news. No fever chills noted. 01/19/19 has been one month since I last saw the patient as he was out of town. With that being said on evaluation today it does appear that he is going to require sharp debridement and I do believe that the total contact cast would benefit him as far as being applied at this time. He is in agreement that plan. 01/23/19 on evaluation today patient appears to be doing better in regard to his left great toe ulcer. He has been shown signs of improvement week by week and although this is slow it does seem to be doing fairly well which is good news. Overall very pleased in this regard. 01/31/19 on evaluation today patient presents for follow-up concerning his great toe ulcer. He was actually supposed to be here yesterday and he did come however he ended up having to leave due to his blood sugar dropping he states he just did not eat enough lunch. Fortunately seems to be doing much better today which is excellent news. Fortunately there's no evidence of infection at this time which is also good news. No fevers, chills, nausea, or vomiting noted at this time. Overall I feel like he is making excellent progress. 02/06/19 on evaluation today patient actually appears to be doing very well in regard to his plantar toe ulcer. He's been tolerating the dressing changes without complication. Fortunately there is no sign of infection at this time. Overall been  very pleased with how things seem to be progressing. No  fevers, chills, nausea, or vomiting noted at this time. 02/13/19 on evaluation today patient actually appears to be doing excellent in regard to his toe ulcer which is almost completely close. Overall very pleased with that. There does not appear to be any signs of infection which is good news. Upon close inspection it appears that he has just a very slight opening still noted at the central portion of the toe although this is minimal. 02/16/19 on evaluation today patient is seen for early follow-up due to the fact that he tells me while at home he actually got up to go answer the door without putting on the walking boot part of his cast and subsequently damaged the total contact cast. It therefore comes in to have this removed and potentially replaced. Fortunately other than it given him a little bit of pain its far as pushing in on some areas there doesn't appear to be any injury once the cast was removed. 03/13/19 on evaluation today patient unfortunately presents for follow-up concerning his great toe ulcer on the left. He was discharged on 02/16/19 with the wound healed at that point. He tells me this remain so for several weeks or least a couple weeks before reopening. Fortunately there does not appear to be any signs of infection at this time. Nonetheless for the past week at least he's had the wound reopened and states it is been trying to keep pressure off of this but he has not been using the offloading shoe we previously gave him. Fortunately there's no evidence of infection at this time. No fevers, chills, nausea, or vomiting noted at this time. [name], [name] ([id number]) 03/17/19 on evaluation today patient actually appears to be doing rather well in regard to his toe also. Fact this appears to be significantly smaller this week even compared to what I saw last week on evaluation. He seems to have done very well for the offloading shoe and overall I'm very pleased with the progress  that is made. It may be that he doesn't even need the cast to get this to heal if he offloads this appropriately. 03/24/19 on evaluation today patient actually appears to be doing well in regard to his plantar foot ulcer. He's been tolerating the dressing changes without complication the collagen does seem to be beneficial for him. With that being said he is gonna require some miles sharp debridement today to clear away some of the callous's around the edge of the wound as well as the minimal [place] noted on the surface of the wound. 03/31/19 on evaluation today patient appears to be doing well in regard to his plantar great toe ulcer. He has been tolerating the dressing changes without complication which is good news. Fortunately there does not appear to be any signs of active infection at this time. Overall very pleased with how things seem to be progressing. 04/11/19 on evaluation today patient's tools are appears to be doing in my pinion roughly about the same as were things that previous. Fortunately there does not appear to be signs of active infection at this time which is good news. With that being said he has not been experiencing any pain at this time which is good news there is also No fevers, chills, nausea, or vomiting noted at this time. 04/18/19 on evaluation today patient's wound actually appears to be doing fairly well today he did not even really have a lot of  callous buildup. We have been debating on whether or not to reinitiate total contact cast. With that being said he seems to be doing fairly well this point with offloading in my pinion and again I think we need to come up with a way to get this healed that will also be sustainable for keeping it close. Again we were able to close it with the cast previous but again he has to be able to keep it such. For that reason we are working on trying to get him diabetic shoes he's waiting for the [facility] to get in touch with the local company  that has to measure him for these do not want to have a cast on at such a time as when they need to actually measure him. 04/25/19 on evaluation today patient appears to be doing well in regard to his plantar foot specifically great toe ulcer. He's been tolerating the dressing changes without complication. Fortunately there's no signs of active infection at this time. Overall I'm very pleased with how things appear currently. 05/02/19 on evaluation today patient appears to actually be doing better in regard to his plantar foot ulcer. He shown signs of good improvement which is excellent news. She states he can walk better with the cast on the can with the offloading shoe that he had on previous. Fortunately there's no signs of active infection at this time. No fevers, chills, nausea, or vomiting noted at this time. 05/09/19 on evaluation today patient actually appears to be doing excellent. In fact he appears to be completely healed based on evaluation at this time. Fortunately there's no signs of infection and is having no discomfort. 06/08/19 this is a follow-up visit although the patient has been healed for almost a month but not quite. He tells me that in the past several days he noted some blood coming from his toe and he thought he should come in at this checked out. Fortunately he's having a pain. He also did get his custom orthotics shoes. With that being said he still appears to have developed this ulceration there is something he tells me that the orthotic specialist stated they could do to the toes to try to help out in this regard although he did not know exactly what it was we may need to check with [facility]. 06/12/19 on evaluation today patient actually appears to be doing great in regard to his foot ulcer. The wound on his toes shown signs of excellent improvement which is great news. With that being said he is not completely close yet the cast has done wonders for him and he is  definitely headed in the correct direction. No fevers, chills, nausea, or vomiting noted at this time. 06/22/19 on evaluation today fortunately patient actually appears to be healing quite well and in fact appears to be completely healed this is excellent news. Fortunately there's no signs of active infection he's been tolerating the total contact cast without complication. Overall I'm pleased with the progress that has been made. He also has his new custom shoes he brought was with him today. 07/04/19 on evaluation today patient actually appears to be doing a little bit worse in regard his toe ulcer unfortunately this has yet again reopened. It has only been about a week and a half since I've last seen him I really did not expect this to reopen this quickly. Nonetheless I did discuss with him his normal routine in order to see what may be causing this.  He has custom shoes. With that being said he apparently has not been using the custom shoes and even when he has used them he has not [name], [name]. ([id number]) even put the insert in there which is what was custom molded to him in order to appropriately offload high-pressure point areas. In fact his exact question to me was whether or not he needed to actually put those in they just came loose in the box and he had never installed them. Nonetheless it also came to light the he's been walking around in his bedroom slippers at home he tells me he does not go barefoot but he really has not been wearing his custom offloading/diabetic shoes. Nonetheless I do believe that this is why he continually reopens as far as the wound is concerned. I discussed this with the patient in great detail today for yet again although I previously had this discussion with him in the past. 07/11/19-Patient returns at 1 week for his left great toe wound which is actually doing really well, he has been using the insert to offload the toe and is very pleased with it.  This looks like this is working out really well for him 07/18/19 on evaluation today patient appears to be doing really about the same with regard to his ulcer on the right great toe. He's been tolerating the dressing changes without complication. Fortunately there's no signs of active infection at this time. With that being said I think that we're having a difficult time getting this to heal and I do believe that initiating treatment with the total contact cast to get this to close would be a good idea. The good news is he seems to be keeping this from worsening so hopefully that means that if we get it closed he can prevent this from reopening in the future. At this point my suggestion was that we go ahead and initiate treatment at both sites utilizing collagen. We will then subsequently put a piece of silver out and it between the toes of the right foot in the webbing between the fourth and fifth toe in order to help keep things dry and allow this to hopefully close and we heal without complication. The patient is in agreement with plan. Subsequently I am hopeful that both wounds will heal quite quickly we have been very close now with the ankle and again the toe has reopened but fortunately doesn't appear to be to bad at this point. No antibiotics were prescribed today as they did not appear to be necessary. 07/25/19 on evaluation today patient actually appears to be doing excellent in regard to his great toe ulcer. In fact this really appears to be almost completely healed if indeed it is not in fact healed. With that being said there is still the eighth small pinpoint area that could be open although I tend to doubt it. Nonetheless it is something that I would like to continue to watch for more week I do think it would be beneficial for [place] to go ahead and continue with the cast for one more week before deciding to discharge him especially in light of the issues he's had in the past. Specifically  with regard to the wound reopening rather quickly. 08/01/2019 upon evaluation today patient actually appears to be doing well with regard to his great toe ulcer. He has been tolerating the dressing changes without complication. Fortunately we think appears to be completely healed at this time which is great news.  I am very pleased with the progress up to this point. In fact he appears to be completely healed at this point. Readmission: 09/15/2019 patient presents today for reevaluation here in our clinic concerning issues that he is having with his left great toe as he did previous. We have not actually seen him since August 01, 2019. He had been doing very well until he noticed when walking into the bathroom with just the socks on the other night that he had a little bit of bleeding from the toe location. Subsequently he decided to make an appointment to come in as he did not want this to get significantly worse. There does not appear to be any signs of active infection he tells me as long as he was wearing his shoes he seemed to be doing okay is walking around not necessarily barefoot but just with socks on that I think may be his main problem at this time. 09/22/2019 on evaluation today patient actually appears to be doing quite well with regard to his great toe ulcer. This is making signs of good improvement even in the short amount of time we have been taking care of his toe since he came back. Is just been 1 week. Nonetheless the improvement in the wound size is excellent he also really has no depth and no evidence of infection which is great news. 09/29/2019 on evaluation today patient appears to be doing well with regard to his toe ulcer. This is showing signs of improvement in fact it was questioning whether or not today this actually was healed. However there did appear to be some callus covering the surface of the wound was removed there did appear to be a small pinpoint opening still in the  middle of the wound although this is minimal. 10/06/2019 on evaluation today patient appears to be doing excellent in regard to his toe ulcer which actually showed signs of complete Epithelization today. There is no evidence of active infection which is good news. Overall he has been tolerating the cast without complication but I think he is done with that as of now. Readmission: 11/28/2019 on evaluation today patient appears to be unfortunately here today again for another issue with his left great toe. He has been having some significant problems intermittently over the past year or really that I been seeing him. Were able to heal him and then subsequently things will get worse yet again. With that being said the patient tells me at this time that he [name], [name]. ([id number]) began to have issues with drainage noted 2 weeks ago. He has no pain but again he is never really had any discomfort. No fevers, chills, nausea, vomiting, or diarrhea. He has been using his offloading shoes he tells me. 12/05/19 on evaluation today patient actually appears to be doing quite well with regard to the wound on his toe. He really hasn't been using the Pegasus offloading shoe for now. He tells me he actually forgot. With that being said there does not appear to be any signs of active infection at this time which is good news. No fevers, chills, nausea, or vomiting noted at this time. 12/12/2019 on evaluation today the patient appears to be doing quite well with regard to his toe ulcer. This does not seem to be showing any signs of worsening he has been using his custom shoes and that seems to be beneficial for him. Fortunately there is no sign of active infection at this point. No fevers,  chills, nausea, vomiting, or diarrhea. 12/19/2019 on evaluation today patient appears to be doing well with regard to his toe ulcer. He has been tolerating dressing changes without complication he is wearing his  offloading shoes and I do feel like they are helping at this point. Fortunately there is no signs of active infection at this time. 12/29-Patient comes in at 1 week visit with regards to his left great toe plantar ulcer, this has been improving, his continue to wear his offloading shoes which are definitely helping he said he noted a very small trace of discharge at 1 point however wound looks stable 01/02/2020 on evaluation today patient appears to be doing well with regard to his toe ulcer as far as things at least not getting any worse than what they have been up to this point. With that being said he continues to have some significant callus buildup however which I think is preventing him from being able to heal due to the friction and pressure getting to this area. Fortunately there is no evidence of active infection at this point which is good news. No fevers, chills, nausea, vomiting, or diarrhea. The patient has no lower extremity edema. 01/09/2020 on evaluation today patient appears to be doing about the same with regard to his toe ulcer. He has been tolerating the dressing changes without complication. Fortunately there is no signs of active infection at this point. No fever chills noted no fevers, chills, nausea, vomiting, or diarrhea. He does have a significant improvement in the overall amount of callus buildup which is good news he has been wearing his shoes he tells me pretty much all the time. With that being said in general the wound does not significantly smaller but again last week he had a lot more callus that I had removed thereby I am not really concerned about the fact that he has more area open minimally compared to last week. Nonetheless if he does not start to see signs of improvement I really think the total contact cast is going to be up utmost importance. 01/16/2020 on evaluation today patient appears to be doing a little worse due to blood blister on the left great toe on  the lateral aspect which has arisen since last time I saw him. Fortunately there is no signs of active infection at this time which is good news. Nonetheless he does not really note anything different that he is done that would have caused this. Fortunately there is no pain but again he does have neuropathy. 01/23/2020 on evaluation today patient appears to be doing well with regard to his toe ulcer. He has been tolerating the dressing changes without complication. Fortunately there is no signs of active infection I do feel like this is the best that have seen his toe in quite some time. He has been using the offloading shoe instead of his personal diabetic shoes. 01/29/2019 upon evaluation today patient appears to be doing excellent in regard to his foot from the standpoint of nothing getting worse although unfortunately he is also not getting a whole lot better. I do think he could potentially benefit from orthopedic felt as opposed to going into the cast as he still somewhat resistant to want to go back into the cast at this point. Nonetheless if we give him a week without and were still seeing no progress I think the cast is good to be the way to go next week for certain. He voiced understanding. With that being said I  do believe that the orthopedic felt is probably something that he can find on [place] I am really not sure of anywhere locally that he can buy that. 02/13/2020 upon evaluation today patient actually appears to be doing excellent in fact he appears to be healed upon inspection today. Fortunately there is no sign of active infection at this time. Electronic Signature(s) Signed: 02/13/2020 2:33:48 PM By: Worthy Keeler PA-C Entered By: Worthy Keeler on 02/13/2020 14:33:48 Pelcher, [name] ([id number]) -------------------------------------------------------------------------------- Physical Exam Details Patient Name: [name] Date of Service: 02/13/2020 1:45  PM Medical Record Number: [id number] Patient Account Number: [id number] Date of Birth/Sex: 04/30/1946 (74 y.o. M) Treating RN: Montey Hora Primary Care Provider: Frazier Richards Other Clinician: Referring Provider: Frazier Richards Treating Provider/Extender: STONE III, Amora Sheehy Weeks in Treatment: 20 Constitutional Well-nourished and well-hydrated in no acute distress. Respiratory normal breathing without difficulty. Psychiatric this patient is able to make decisions and demonstrates good insight into disease process. Alert and Oriented x 3. pleasant and cooperative. Notes Upon inspection patient's wound actually showed signs of good epithelization at this time and in fact I do not see anything open at all at this point. This is excellent news and I am very pleased. The patient actually got this to heal without the use of the cast using his offloading shoe in an excellent fashion. Electronic Signature(s) Signed: 02/13/2020 2:34:17 PM By: Worthy Keeler PA-C Entered By: Worthy Keeler on 02/13/2020 14:34:16 Rodd, [name] ([id number]) -------------------------------------------------------------------------------- Physician Orders Details Patient Name: [name] Date of Service: 02/13/2020 1:45 PM Medical Record Number: [id number] Patient Account Number: [id number] Date of Birth/Sex: 1946/12/05 (74 y.o. M) Treating RN: Montey Hora Primary Care Provider: Frazier Richards Other Clinician: Referring Provider: Frazier Richards Treating Provider/Extender: Melburn Hake, Olon Russ Weeks in Treatment: 60 Verbal / Phone Orders: No Diagnosis Coding ICD-10 Coding Code Description E11.621 Type 2 diabetes mellitus with foot ulcer L97.522 Non-pressure chronic ulcer of other part of left foot with fat layer exposed I10 Essential (primary) hypertension E11.43 Type 2 diabetes mellitus with diabetic autonomic (poly)neuropathy Discharge From [facility] o Discharge  from [place] offloading this area with orthopedic adhesive felt Electronic Signature(s) Signed: 02/13/2020 4:37:42 PM By: Montey Hora Signed: 02/13/2020 5:42:48 PM By: Worthy Keeler PA-C Entered By: Montey Hora on 02/13/2020 14:28:38 [name] ([id number]) -------------------------------------------------------------------------------- Problem List Details Patient Name: [name]. Date of Service: 02/13/2020 1:45 PM Medical Record Number: [id number] Patient Account Number: [id number] Date of Birth/Sex: 10/31/1946 (74 y.o. 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 M) Treating RN: Montey Hora Primary Care Provider: Frazier Richards Other Clinician: Referring Provider: Frazier Richards Treating Provider/Extender: Melburn Hake, Laina Guerrieri Weeks in Treatment: 11 Subjective Chief  Complaint Information obtained  from Patient Left great toe ulcer History of Present Illness (HPI) 12/12/18 on evaluation today patient presents as a referral from his endocrinologist regarding issues that he has been having with his left great toe. Subsequently he has been seeing Dr. Cleda Mccreedy and Dr. Cleda Mccreedy has been the breeding the wound and in fact this was debrided this morning. The patient had an appointment there prior to coming here. Subsequently he states that even though he's been going for regular debridement after office things really do not seem to be showing signs of improvement unfortunately. He states that he is having some discomfort but nothing too significant. No fevers, chills, nausea, or vomiting noted at this time. The patient does have a history of hypertension, neuropathy, diabetes mellitus type II, and a history of stroke. Currently he's been using bacitracin on the toe and utilizing a Band-Aid. He tells me he's had this wound for two years. He cannot remember the last time he had an x-ray. 12/19/18 on evaluation today patient appears to be doing well in regard to his plantar toe ulcer. He's been tolerating the dressing changes without complication. Fortunately there does not appear to be signs of infection at this time which is good news. No fever chills noted. 01/19/19 has been one month since I last saw the patient as he was out of town. With that being said on evaluation today it does appear that he is going to require sharp debridement and I do believe that the total contact cast would benefit him as far as being applied at this time. He is in agreement that plan. 01/23/19 on evaluation today patient appears to be doing better in regard to his left great toe ulcer. He has been shown signs of improvement week by week and although this is slow it does seem to be doing fairly well which is good news. Overall very pleased in this regard. 01/31/19 on evaluation today patient presents for follow-up concerning his  great toe ulcer. He was actually supposed to be here yesterday and he did come however he ended up having to leave due to his blood sugar dropping he states he just did not eat enough lunch. Fortunately seems to be doing much better today which is excellent news. Fortunately there's no evidence of infection at this time which is also good news. No fevers, chills, nausea, or vomiting noted at this time. Overall I feel like he is making excellent progress. 02/06/19 on evaluation today patient actually appears to be doing very well in regard to his plantar toe ulcer. He's been tolerating the dressing changes without complication. Fortunately there is no sign of infection at this time. Overall been very pleased with how things seem to be progressing. No fevers, chills, nausea, or vomiting noted at this time. 02/13/19 on evaluation today patient actually appears to be doing excellent in regard to his toe ulcer which is almost completely close. Overall very pleased with that. There does not appear to be any signs of infection which is good news. Upon close inspection it appears that he has just a very slight opening still noted at the central portion of the toe although this is minimal. 02/16/19 on evaluation today patient is seen for early follow-up due to the fact that he tells me while at home he actually got up to go answer the door without putting on the walking boot part of his cast and subsequently damaged the total contact cast. It therefore comes in to have  this removed and potentially replaced. Fortunately other than it given him a little bit of pain its far as pushing in on some areas there doesn't appear to be any injury once the cast was removed. [name], [name] ([id number]) 03/13/19 on evaluation today patient unfortunately presents for follow-up concerning his great toe ulcer on the left. He was discharged on 02/16/19 with the wound healed at that point. He tells me this remain so for  several weeks or least a couple weeks before reopening. Fortunately there does not appear to be any signs of infection at this time. Nonetheless for the past week at least he's had the wound reopened and states it is been trying to keep pressure off of this but he has not been using the offloading shoe we previously gave him. Fortunately there's no evidence of infection at this time. No fevers, chills, nausea, or vomiting noted at this time. 03/17/19 on evaluation today patient actually appears to be doing rather well in regard to his toe also. Fact this appears to be significantly smaller this week even compared to what I saw last week on evaluation. He seems to have done very well for the offloading shoe and overall I'm very pleased with the progress that is made. It may be that he doesn't even need the cast to get this to heal if he offloads this appropriately. 03/24/19 on evaluation today patient actually appears to be doing well in regard to his plantar foot ulcer. He's been tolerating the dressing changes without complication the collagen does seem to be beneficial for him. With that being said he is gonna require some miles sharp debridement today to clear away some of the callous's around the edge of the wound as well as the minimal Slough noted on the surface of the wound. 03/31/19 on evaluation today patient appears to be doing well in regard to his plantar great toe ulcer. He has been tolerating the dressing changes without complication which is good news. Fortunately there does not appear to be any signs of active infection at this time. Overall very pleased with how things seem to be progressing. 04/11/19 on evaluation today patient's tools are appears to be doing in my pinion roughly about the same as were things that previous. Fortunately there does not appear to be signs of active infection at this time which is good news. With that being said he has not been experiencing any pain at  this time which is good news there is also No fevers, chills, nausea, or vomiting noted at this time. 04/18/19 on evaluation today patient's wound actually appears to be doing fairly well today he did not even really have a lot of callous buildup. We have been debating on whether or not to reinitiate total contact cast. With that being said he seems to be doing fairly well this point with offloading in my pinion and again I think we need to come up with a way to get this healed that will also be sustainable for keeping it close. Again we were able to close it with the cast previous but again he has to be able to keep it such. For that reason we are working on trying to get him diabetic shoes he's waiting for the [place] to get in touch with the local company that has to measure him for these do not want to have a cast on at such a time as when they need to actually measure him. 04/25/19 on evaluation today patient appears  to be doing well in regard to his plantar foot specifically great toe ulcer. He's been tolerating the dressing changes without complication. Fortunately there's no signs of active infection at this time. Overall I'm very pleased with how things appear currently. 05/02/19 on evaluation today patient appears to actually be doing better in regard to his plantar foot ulcer. He shown signs of good improvement which is excellent news. She states he can walk better with the cast on the can with the offloading shoe that he had on previous. Fortunately there's no signs of active infection at this time. No fevers, chills, nausea, or vomiting noted at this time. 05/09/19 on evaluation today patient actually appears to be doing excellent. In fact he appears to be completely healed based on evaluation at this time. Fortunately there's no signs of infection and is having no discomfort. 06/08/19 this is a follow-up visit although the patient has been healed for almost a month but not quite. He tells me  that in the past several days he noted some blood coming from his toe and he thought he should come in at this checked out. Fortunately he's having a pain. He also did get his custom orthotics shoes. With that being said he still appears to have developed this ulceration there is something he tells me that the orthotic specialist stated they could do to the toes to try to help out in this regard although he did not know exactly what it was we may need to check with [facility]. 06/12/19 on evaluation today patient actually appears to be doing great in regard to his foot ulcer. The wound on his toes shown signs of excellent improvement which is great news. With that being said he is not completely close yet the cast has done wonders for him and he is definitely headed in the correct direction. No fevers, chills, nausea, or vomiting noted at this time. 06/22/19 on evaluation today fortunately patient actually appears to be healing quite well and in fact appears to be completely healed this is excellent news. Fortunately there's no signs of active infection he's been tolerating the total contact cast without complication. Overall I'm pleased with the progress that has been made. He also has his new custom shoes he brought was [name], [name] ([id number]) with him today. 07/04/19 on evaluation today patient actually appears to be doing a little bit worse in regard his toe ulcer unfortunately this has yet again reopened. It has only been about a week and a half since I've last seen him I really did not expect this to reopen this quickly. Nonetheless I did discuss with him his normal routine in order to see what may be causing this. He has custom shoes. With that being said he apparently has not been using the custom shoes and even when he has used them he has not even put the insert in there which is what was custom molded to him in order to appropriately offload high-pressure point areas. In fact  his exact question to me was whether or not he needed to actually put those in they just came loose in the box and he had never installed them. Nonetheless it also came to light the he's been walking around in his bedroom slippers at home he tells me he does not go barefoot but he really has not been wearing his custom offloading/diabetic shoes. Nonetheless I do believe that this is why he continually reopens as far as the wound is concerned. I  discussed this with the patient in great detail today for yet again although I previously had this discussion with him in the past. 07/11/19-Patient returns at 1 week for his left great toe wound which is actually doing really well, he has been using the insert to offload the toe and is very pleased with it. This looks like this is working out really well for him 07/18/19 on evaluation today patient appears to be doing really about the same with regard to his ulcer on the right great toe. He's been tolerating the dressing changes without complication. Fortunately there's no signs of active infection at this time. With that being said I think that we're having a difficult time getting this to heal and I do believe that initiating treatment with the total contact cast to get this to close would be a good idea. The good news is he seems to be keeping this from worsening so hopefully that means that if we get it closed he can prevent this from reopening in the future. At this point my suggestion was that we go ahead and initiate treatment at both sites utilizing collagen. We will then subsequently put a piece of silver out and it between the toes of the right foot in the webbing between the fourth and fifth toe in order to help keep things dry and allow this to hopefully close and we heal without complication. The patient is in agreement with plan. Subsequently I am hopeful that both wounds will heal quite quickly we have been very close now with the ankle and again  the toe has reopened but fortunately doesn't appear to be to bad at this point. No antibiotics were prescribed today as they did not appear to be necessary. 07/25/19 on evaluation today patient actually appears to be doing excellent in regard to his great toe ulcer. In fact this really appears to be almost completely healed if indeed it is not in fact healed. With that being said there is still the eighth small pinpoint area that could be open although I tend to doubt it. Nonetheless it is something that I would like to continue to watch for more week I do think it would be beneficial for [place] to go ahead and continue with the cast for one more week before deciding to discharge him especially in light of the issues he's had in the past. Specifically with regard to the wound reopening rather quickly. 08/01/2019 upon evaluation today patient actually appears to be doing well with regard to his great toe ulcer. He has been tolerating the dressing changes without complication. Fortunately we think appears to be completely healed at this time which is great news. I am very pleased with the progress up to this point. In fact he appears to be completely healed at this point. Readmission: 09/15/2019 patient presents today for reevaluation here in our clinic concerning issues that he is having with his left great toe as he did previous. We have not actually seen him since August 01, 2019. He had been doing very well until he noticed when walking into the bathroom with just the socks on the other night that he had a little bit of bleeding from the toe location. Subsequently he decided to make an appointment to come in as he did not want this to get significantly worse. There does not appear to be any signs of active infection he tells me as long as he was wearing his shoes he seemed to be doing okay  is walking around not necessarily barefoot but just with socks on that I think may be his main problem at this  time. 09/22/2019 on evaluation today patient actually appears to be doing quite well with regard to his great toe ulcer. This is making signs of good improvement even in the short amount of time we have been taking care of his toe since he came back. Is just been 1 week. Nonetheless the improvement in the wound size is excellent he also really has no depth and no evidence of infection which is great news. 09/29/2019 on evaluation today patient appears to be doing well with regard to his toe ulcer. This is showing signs of improvement in fact it was questioning whether or not today this actually was healed. However there did appear to be some callus covering the surface of the wound was removed there did appear to be a small pinpoint opening still in the middle of the wound although this is minimal. 10/06/2019 on evaluation today patient appears to be doing excellent in regard to his toe ulcer which actually showed signs of complete Epithelization today. There is no evidence of active infection which is good news. Overall he has been tolerating the cast without complication but I think he is done with that as of now. [name], [name] ([id number]) Readmission: 11/28/2019 on evaluation today patient appears to be unfortunately here today again for another issue with his left great toe. He has been having some significant problems intermittently over the past year or really that I been seeing him. Were able to heal him and then subsequently things will get worse yet again. With that being said the patient tells me at this time that he began to have issues with drainage noted 2 weeks ago. He has no pain but again he is never really had any discomfort. No fevers, chills, nausea, vomiting, or diarrhea. He has been using his offloading shoes he tells me. 12/05/19 on evaluation today patient actually appears to be doing quite well with regard to the wound on his toe. He really hasn't been using the  [employer] offloading shoe for now. He tells me he actually forgot. With that being said there does not appear to be any signs of active infection at this time which is good news. No fevers, chills, nausea, or vomiting noted at this time. 12/12/2019 on evaluation today the patient appears to be doing quite well with regard to his toe ulcer. This does not seem to be showing any signs of worsening he has been using his custom shoes and that seems to be beneficial for him. Fortunately there is no sign of active infection at this point. No fevers, chills, nausea, vomiting, or diarrhea. 12/19/2019 on evaluation today patient appears to be doing well with regard to his toe ulcer. He has been tolerating dressing changes without complication he is wearing his offloading shoes and I do feel like they are helping at this point. Fortunately there is no signs of active infection at this time. 12/29-Patient comes in at 1 week visit with regards to his left great toe plantar ulcer, this has been improving, his continue to wear his offloading shoes which are definitely helping he said he noted a very small trace of discharge at 1 point however wound looks stable 01/02/2020 on evaluation today patient appears to be doing well with regard to his toe ulcer as far as things at least not getting any worse than what they have been up to this  point. With that being said he continues to have some significant callus buildup however which I think is preventing him from being able to heal due to the friction and pressure getting to this area. Fortunately there is no evidence of active infection at this point which is good news. No fevers, chills, nausea, vomiting, or diarrhea. The patient has no lower extremity edema. 01/09/2020 on evaluation today patient appears to be doing about the same with regard to his toe ulcer. He has been tolerating the dressing changes without complication. Fortunately there is no signs of active  infection at this point. No fever chills noted no fevers, chills, nausea, vomiting, or diarrhea. He does have a significant improvement in the overall amount of callus buildup which is good news he has been wearing his shoes he tells me pretty much all the time. With that being said in general the wound does not significantly smaller but again last week he had a lot more callus that I had removed thereby I am not really concerned about the fact that he has more area open minimally compared to last week. Nonetheless if he does not start to see signs of improvement I really think the total contact cast is going to be up utmost importance. 01/16/2020 on evaluation today patient appears to be doing a little worse due to blood blister on the left great toe on the lateral aspect which has arisen since last time I saw him. Fortunately there is no signs of active infection at this time which is good news. Nonetheless he does not really note anything different that he is done that would have caused this. Fortunately there is no pain but again he does have neuropathy. 01/23/2020 on evaluation today patient appears to be doing well with regard to his toe ulcer. He has been tolerating the dressing changes without complication. Fortunately there is no signs of active infection I do feel like this is the best that have seen his toe in quite some time. He has been using the offloading shoe instead of his personal diabetic shoes. 01/29/2019 upon evaluation today patient appears to be doing excellent in regard to his foot from the standpoint of nothing getting worse although unfortunately he is also not getting a whole lot better. I do think he could potentially benefit from orthopedic felt as opposed to going into the cast as he still somewhat resistant to want to go back into the cast at this point. Nonetheless if we give him a week without and were still seeing no progress I think the cast is good to be the way to go  next week for certain. He voiced understanding. With that being said I do believe that the orthopedic felt is probably something that he can find on [place] I am really not sure of anywhere locally that he can buy that. 02/13/2020 upon evaluation today patient actually appears to be doing excellent in fact he appears to be healed upon inspection today. Fortunately there is no sign of active infection at this time. [name], [name] ([id number]) Objective Constitutional Well-nourished and well-hydrated in no acute distress. Vitals Time Taken: 1:55 PM, Height: 72 in, Weight: 254 lbs, BMI: 34.4, Temperature: 98.1 F, Pulse: 67 bpm, Respiratory Rate: 18 breaths/min, Blood Pressure: 157/90 mmHg. Respiratory normal breathing without difficulty. Psychiatric this patient is able to make decisions and demonstrates good insight into disease process. Alert and Oriented x 3. pleasant and cooperative. General Notes: Upon inspection patient's wound actually showed signs of good  epithelization at this time and in fact I do not see anything open at all at this point. This is excellent news and I am very pleased. The patient actually got this to heal without the use of the cast using his offloading shoe in an excellent fashion. Integumentary (Hair, Skin) Wound #1 status is Healed - Epithelialized. Original cause of wound was Gradually Appeared. The wound is located on the Left Toe Great. The wound measures 0cm length x 0cm width x 0cm depth; 0cm^2 area and 0cm^3 volume. There is Fat Layer (Subcutaneous Tissue) Exposed exposed. There is a medium amount of serosanguineous drainage noted. The wound margin is flat and intact. There is large (67-100%) granulation within the wound bed. There is a small (1-33%) amount of necrotic tissue within the wound bed including Eschar and Adherent Slough. Assessment Active Problems ICD-10 Type 2 diabetes mellitus with foot ulcer Non-pressure chronic ulcer of other  part of left foot with fat layer exposed Essential (primary) hypertension Type 2 diabetes mellitus with diabetic autonomic (poly)neuropathy Plan Discharge From [facility] Services: Discharge from [facility] offloading this area with orthopedic adhesive felt [name], [name] ([id number]) 1. I would recommend currently we discontinue wound care services and patient is in agreement with that plan. 2. I do recommend he continue to use the offloading shoe for the time being and I do recommend as well he get some orthopedic offloading felt 1/4 inch that he can cut to size which I think should be beneficial for him as well and keeping pressure off of the toe area going forward. This is something he [place] buy [place] which I showed him today as well. We will see patient back for reevaluation in 1 week here in the clinic. If anything worsens or changes patient will contact our office for additional recommendations. Electronic Signature(s) Signed: 02/13/2020 2:34:45 PM By: Worthy Keeler PA-C Entered By: Worthy Keeler on 02/13/2020 14:34:44 [name], [name] ([id number]) -------------------------------------------------------------------------------- SuperBill Details Patient Name: [name] Date of Service: 02/13/2020 Medical Record Number: [id number] Patient Account Number: [id number] Date of Birth/Sex: 08/07/1946 (74 y.o. 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 Facility Procedures CPT4 Code: [id number] Description:  99213 - WOUND CARE VISIT-LEV 3 EST PT Modifier: Quantity: 1 Physician Procedures CPT4 Code: [id number] Description: 99213 - WC PHYS LEVEL 3 - EST PT ICD-10 Diagnosis Description E11.621 Type 2 diabetes mellitus with foot ulcer L97.522 Non-pressure chronic ulcer of other part of left foot with fat I10 Essential (primary) hypertension E11.43 Type 2 diabetes  mellitus with diabetic autonomic (poly)neuropa Modifier: layer exposed thy Quantity: 1 Electronic Signature(s) Signed: 02/13/2020 2:43:25 PM By: Montey Hora Signed: 02/13/2020 5:42:48 PM By: Worthy Keeler PA-C Previous Signature: 02/13/2020 2:34:55 PM Version By: Worthy Keeler PA-C Entered By: Montey Hora on 02/13/2020 14:43:24

## 2020-02-13 NOTE — Progress Notes (Addendum)
SHMAR, FOCHS ([id number]) Visit Report for 02/13/2020 Arrival Information Details Patient Name: [name], [name]. Date of Service: 02/13/2020 1:45 PM Medical Record Number: [id number] Patient Account Number: [id number] Date of Birth/Sex: [birth year] (74 y.o. M) Treating RN: Montey Hora Primary Care Dearion Huot: Frazier Richards Other Clinician: Referring Karina Nofsinger: Frazier Richards Treating Marybel Alcott/Extender: Melburn Hake, HOYT Weeks in Treatment: 11 Visit Information History Since Last Visit Added or deleted any medications: No Patient Arrived: Cane Any new allergies or adverse reactions: No Arrival Time: 13:54 Had a fall or experienced change in No Accompanied By: self activities of daily living that may affect Transfer Assistance: None risk of falls: Patient Identification Verified: Yes Signs or symptoms of abuse/neglect since last visito No Secondary Verification Process Completed: Yes Hospitalized since last visit: No Implantable device outside of the clinic excluding No cellular tissue based products placed in the center since last visit: Has Dressing in Place as Prescribed: Yes Pain Present Now: No Electronic Signature(s) Signed: 02/13/2020 4:25:21 PM By: Lorine Bears RCP, RRT, CHT Entered By: Lorine Bears on 02/13/2020 13:55:57 Werth, [name] ([id number]) -------------------------------------------------------------------------------- Clinic Level of Care Assessment Details Patient Name: [name]. Date of Service: 02/13/2020 1:45 PM Medical Record Number: [id number] Patient Account Number: [id number] Date of Birth/Sex: 07-Jul-1946 (74 y.o. M) Treating RN: Montey Hora Primary Care Dashawn Bartnick: Frazier Richards Other Clinician: Referring Karolynn Infantino: Frazier Richards Treating Pericles [name]/Extender: Melburn Hake, HOYT Weeks in Treatment: 11 Clinic Level of Care Assessment Items TOOL 4 Quantity Score []  - Use  when only an EandM is performed on FOLLOW-UP visit 0 ASSESSMENTS - Nursing Assessment / Reassessment X - Reassessment of Co-morbidities (includes updates in patient status) 1 10 X- 1 5 Reassessment of Adherence to Treatment Plan ASSESSMENTS - Wound and Skin Assessment / Reassessment X - Simple Wound Assessment / Reassessment - one wound 1 5 []  - 0 Complex Wound Assessment / Reassessment - multiple wounds []  - 0 Dermatologic / Skin Assessment (not related to wound area) ASSESSMENTS - Focused Assessment []  - Circumferential Edema Measurements - multi extremities 0 []  - 0 Nutritional Assessment / Counseling / Intervention X- 1 5 Lower Extremity Assessment (monofilament, tuning fork, pulses) []  - 0 Peripheral Arterial Disease Assessment (using hand held doppler) ASSESSMENTS - Ostomy and/or Continence Assessment and Care []  - Incontinence Assessment and Management 0 []  - 0 Ostomy Care Assessment and Management (repouching, etc.) PROCESS - Coordination of Care X - Simple Patient / Family Education for ongoing care 1 15 []  - 0 Complex (extensive) Patient / Family Education for ongoing care X- 1 10 Staff obtains Programmer, systems, Records, Test Results / Process Orders []  - 0 Staff telephones HHA, Nursing Homes / Clarify orders / etc []  - 0 Routine Transfer to another Facility (non-emergent condition) []  - 0 Routine Hospital Admission (non-emergent condition) []  - 0 New Admissions / Biomedical engineer / Ordering NPWT, Apligraf, etc. []  - 0 Emergency Hospital Admission (emergent condition) X- 1 10 Simple Discharge Coordination JOHNELL, [place] ([id number]) []  - 0 Complex (extensive) Discharge Coordination PROCESS - Special Needs []  - Pediatric / Minor Patient Management 0 []  - 0 Isolation Patient Management []  - 0 Hearing / Language / Visual special needs []  - 0 Assessment of Community assistance (transportation, D/C planning, etc.) []  - 0 Additional assistance /  Altered mentation []  - 0 Support Surface(s) Assessment (bed, cushion, seat, etc.) INTERVENTIONS - Wound Cleansing / Measurement X - Simple Wound Cleansing - one wound 1 5 []  - 0 Complex Wound Cleansing - multiple wounds  X- 1 5 Wound Imaging (photographs - any number of wounds) []  - 0 Wound Tracing (instead of photographs) X- 1 5 Simple Wound Measurement - one wound []  - 0 Complex Wound Measurement - multiple wounds INTERVENTIONS - Wound Dressings []  - Small Wound Dressing one or multiple wounds 0 []  - 0 Medium Wound Dressing one or multiple wounds []  - 0 Large Wound Dressing one or multiple wounds []  - 0 Application of Medications - topical []  - 0 Application of Medications - injection INTERVENTIONS - Miscellaneous []  - External ear exam 0 []  - 0 Specimen Collection (cultures, biopsies, blood, body fluids, etc.) []  - 0 Specimen(s) / Culture(s) sent or taken to Lab for analysis []  - 0 Patient Transfer (multiple staff / Civil Service fast streamer / Similar devices) []  - 0 Simple Staple / Suture removal (25 or less) []  - 0 Complex Staple / Suture removal (26 or more) []  - 0 Hypo / Hyperglycemic Management (close monitor of Blood Glucose) []  - 0 Ankle / Brachial Index (ABI) - do not check if billed separately X- 1 5 Vital Signs Favor, ELO ROULAND ([id number]) Has the patient been seen at the hospital within the last three years: Yes Total Score: 80 Level Of Care: New/Established - Level 3 Electronic Signature(s) Signed: 02/13/2020 4:37:42 PM By: Montey Hora Entered By: Montey Hora on 02/13/2020 14:43:15 Bacote, [name] ([id number]) -------------------------------------------------------------------------------- Encounter Discharge Information Details Patient Name: [name]. Date of Service: 02/13/2020 1:45 PM Medical Record Number: [id number] Patient Account Number: [id number] Date of Birth/Sex: [birth year] (74 y.o. M) Treating RN: Montey Hora Primary Care Karenna Romanoff: Frazier Richards Other Clinician: Referring Jaysean Manville: Frazier Richards Treating Meleana Commerford/Extender: Melburn Hake, HOYT Weeks in Treatment: 11 Encounter Discharge Information Items Discharge Condition: Stable Ambulatory Status: Ambulatory Discharge Destination: Home Transportation: Private Auto Accompanied By: self Schedule Follow-up Appointment: No Clinical Summary of Care: Electronic Signature(s) Signed: 02/13/2020 2:44:01 PM By: Montey Hora Entered By: Montey Hora on 02/13/2020 14:44:00 Haralson, [name] ([id number]) -------------------------------------------------------------------------------- Lower Extremity Assessment Details Patient Name: [name]. Date of Service: 02/13/2020 1:45 PM Medical Record Number: [id number] Patient Account Number: [id number] Date of Birth/Sex: 1946-07-30 (74 y.o. M) Treating RN: Army Melia Primary Care Shevette Bess: Frazier Richards Other Clinician: Referring Chenika Nevils: Frazier Richards Treating Zoi Devine/Extender: STONE III, HOYT Weeks in Treatment: 11 Edema Assessment Assessed: [Left: No] [Right: No] Edema: [Left: N] [Right: o] Vascular Assessment Pulses: Dorsalis Pedis Palpable: [Left:Yes] Electronic Signature(s) Signed: 02/13/2020 3:31:29 PM By: Army Melia Entered By: Army Melia on 02/13/2020 14:00:08 [name] ([id number]) -------------------------------------------------------------------------------- Multi Wound Chart Details Patient Name: [name]. Date of Service: 02/13/2020 1:45 PM Medical Record Number: [id number] Patient Account Number: [id number] Date of Birth/Sex: 12/04/1946 (74 y.o. M) Treating RN: Montey Hora Primary Care Nikala Walsworth: Frazier Richards Other Clinician: Referring Markell Sciascia: Frazier Richards Treating Alany Borman/Extender: Melburn Hake, HOYT Weeks in Treatment: 11 Vital Signs Height(in): 72 Pulse(bpm): 67 Weight(lbs):  254 Blood Pressure(mmHg): 157/90 Body Mass Index(BMI): 34 Temperature(F): 98.1 Respiratory Rate 18 (breaths/min): Photos: [N/A:N/A] Wound Location: Left Toe Great N/A N/A Wounding Event: Gradually Appeared N/A N/A Primary Etiology: Diabetic Wound/Ulcer of the N/A N/A Lower Extremity Comorbid History: Hypertension, Type II Diabetes N/A N/A Date Acquired: 11/14/2019 N/A N/A Weeks of Treatment: 11 N/A N/A Wound Status: Healed - Epithelialized N/A N/A Measurements L x W x D 0x0x0 N/A N/A (cm) Area (cm) : 0 N/A N/A Volume (cm) : 0 N/A N/A % Reduction in Area: 100.00% N/A N/A % Reduction in Volume: 100.00% N/A N/A Classification: Grade 1 N/A  N/A Exudate Amount: Medium N/A N/A Exudate Type: Serosanguineous N/A N/A Exudate Color: red, brown N/A N/A Wound Margin: Flat and Intact N/A N/A Granulation Amount: Large (67-100%) N/A N/A Necrotic Amount: Small (1-33%) N/A N/A Necrotic Tissue: Eschar, Adherent Slough N/A N/A Exposed Structures: Fat Layer (Subcutaneous N/A N/A Tissue) Exposed: Yes Fascia: No Tendon: No Muscle: No Joint: No Bone: No SHERRI, BALBUENA ([id number]) Epithelialization: Medium (34-66%) N/A N/A Treatment Notes Electronic Signature(s) Signed: 02/13/2020 4:37:42 PM By: Montey Hora Entered By: Montey Hora on 02/13/2020 14:26:15 Kendrick, [name] ([id number]) -------------------------------------------------------------------------------- [place] Details Patient Name: [name]. Date of Service: 02/13/2020 1:45 PM Medical Record Number: [id number] Patient Account Number: [id number] Date of Birth/Sex: 03/27/1946 (74 y.o. M) Treating RN: Montey Hora Primary Care Sahmya Arai: Frazier Richards Other Clinician: Referring Breylin Dom: Frazier Richards Treating Kimmarie Pascale/Extender: Melburn Hake, HOYT Weeks in Treatment: 11 Active Inactive Electronic Signature(s) Signed: 02/13/2020 4:37:42 PM By: Montey Hora Entered  By: Montey Hora on 02/13/2020 14:24:51 Fordyce, [name] ([id number]) -------------------------------------------------------------------------------- Pain Assessment Details Patient Name: [name] Date of Service: 02/13/2020 1:45 PM Medical Record Number: [id number] Patient Account Number: [id number] Date of Birth/Sex: Mar 09, 1946 (74 y.o. M) Treating RN: Montey Hora Primary Care Lilyanna Lunt: Frazier Richards Other Clinician: Referring Ryin Schillo: Frazier Richards Treating Corynn Solberg/Extender: Melburn Hake, HOYT Weeks in Treatment: 11 Active Problems Location of Pain Severity and Description of Pain Patient Has Paino No Site Locations Pain Management and Medication Current Pain Management: Electronic Signature(s) Signed: 02/13/2020 4:25:21 PM By: Paulla Fore, RRT, CHT Signed: 02/13/2020 4:37:42 PM By: Montey Hora Entered By: Lorine Bears on 02/13/2020 13:56:05 Kargbo, [name] ([id number]) -------------------------------------------------------------------------------- Patient/Caregiver Education Details Patient Name: [name] Date of Service: 02/13/2020 1:45 PM Medical Record Number: [id number] Patient Account Number: [id number] Date of Birth/Gender: 11-16-1946 (74 y.o. M) Treating RN: Montey Hora Primary Care Physician: Frazier Richards Other Clinician: Referring Physician: Frazier Richards Treating Physician/Extender: Sharalyn Ink in Treatment: 11 Education Assessment Education Provided To: Patient Education Topics Provided Basic Hygiene: Handouts: Other: care of newly healed wound site Methods: Explain/Verbal Responses: State content correctly Electronic Signature(s) Signed: 02/13/2020 4:37:42 PM By: Montey Hora Entered By: Montey Hora on 02/13/2020 14:43:46 [place], [name]  ([id number]) -------------------------------------------------------------------------------- Wound Assessment Details Patient Name: [name]. Date of Service: 02/13/2020 1:45 PM Medical Record Number: [id number] Patient Account Number: [id number] Date of Birth/Sex: 1946/11/06 (74 y.o. M) Treating RN: Montey Hora Primary Care Joniya Boberg: Frazier Richards Other Clinician: Referring Harlan Vinal: Frazier Richards Treating Jaison Petraglia/Extender: Melburn Hake, HOYT Weeks in Treatment: 11 Wound Status Wound Number: 1 Primary Etiology: Diabetic Wound/Ulcer of the Lower Extremity Wound Location: Left Toe Great Wound Status: Healed - Epithelialized Wounding Event: Gradually Appeared Comorbid Hypertension, Type II Diabetes Date Acquired: 11/14/2019 History: Weeks Of Treatment: 11 Clustered Wound: No Photos Wound Measurements Length: (cm) 0 % Reducti Width: (cm) 0 % Reducti Depth: (cm) 0 Epithelia Area: (cm) 0 Volume: (cm) 0 on in Area: 100% on in Volume: 100% lization: Medium (34-66%) Wound Description Classification: Grade 1 Foul Odor Wound Margin: Flat and Intact Slough/Fi Exudate Amount: Medium Exudate Type: Serosanguineous Exudate Color: red, brown After Cleansing: No brino Yes Wound Bed Granulation Amount: Large (67-100%) Exposed Structure Necrotic Amount: Small (1-33%) Fascia Exposed: No Necrotic Quality: Eschar, Adherent Slough Fat Layer (Subcutaneous Tissue) Exposed: Yes Tendon Exposed: No Muscle Exposed: No Joint Exposed: No Bone Exposed: No Electronic Signature(s) KAOS, HOYE ([id number]) Signed: 02/13/2020 4:37:42 PM By: Montey Hora Entered By: Montey Hora on 02/13/2020 14:24:36 Hypolite, Albertina Parr  L. ([id number]) -------------------------------------------------------------------------------- Vitals Details Patient Name: HAYAAN, TY. Date of Service: 02/13/2020 1:45 PM Medical Record Number: [id number] Patient Account  Number: [id number] Date of Birth/Sex: 06/14/1946 (74 y.o. M) Treating RN: Montey Hora Primary Care Maeley Matton: Frazier Richards Other Clinician: Referring Delmo Matty: Frazier Richards Treating Abdo Denault/Extender: Melburn Hake, HOYT Weeks in Treatment: 11 Vital Signs Time Taken: 13:55 Temperature (F): 98.1 Height (in): 72 Pulse (bpm): 67 Weight (lbs): 254 Respiratory Rate (breaths/min): 18 Body Mass Index (BMI): 34.4 Blood Pressure (mmHg): 157/90 Reference Range: 80 - 120 mg / dl Electronic Signature(s) Signed: 02/13/2020 4:25:21 PM By: Lorine Bears RCP, RRT, CHT Entered By: Lorine Bears on 02/13/2020 13:56:49

## 2020-03-06 ENCOUNTER — Emergency Department
Admission: EM | Admit: 2020-03-06 | Discharge: 2020-03-06 | Disposition: A | Discharge status: Home / Self Care | Payer: Medicare Other | Attending: Emergency Medicine | Admitting: Emergency Medicine

## 2020-03-06 ENCOUNTER — Other Ambulatory Visit: Payer: Self-pay

## 2020-03-06 ENCOUNTER — Emergency Department: Payer: Medicare Other

## 2020-03-06 DIAGNOSIS — Z79899 Other long term (current) drug therapy: Secondary | ICD-10-CM | POA: Diagnosis not present

## 2020-03-06 DIAGNOSIS — Z951 Presence of aortocoronary bypass graft: Secondary | ICD-10-CM | POA: Diagnosis not present

## 2020-03-06 DIAGNOSIS — Z7982 Long term (current) use of aspirin: Secondary | ICD-10-CM | POA: Insufficient documentation

## 2020-03-06 DIAGNOSIS — E11649 Type 2 diabetes mellitus with hypoglycemia without coma: Secondary | ICD-10-CM | POA: Diagnosis present

## 2020-03-06 DIAGNOSIS — I10 Essential (primary) hypertension: Secondary | ICD-10-CM | POA: Insufficient documentation

## 2020-03-06 DIAGNOSIS — I639 Cerebral infarction, unspecified: Secondary | ICD-10-CM | POA: Diagnosis not present

## 2020-03-06 DIAGNOSIS — E162 Hypoglycemia, unspecified: Secondary | ICD-10-CM

## 2020-03-06 DIAGNOSIS — Z794 Long term (current) use of insulin: Secondary | ICD-10-CM | POA: Diagnosis not present

## 2020-03-06 LAB — CBC
HCT: 41.2 % (ref 39.0–52.0)
Hemoglobin: 13.1 g/dL (ref 13.0–17.0)
MCH: 29.2 pg (ref 26.0–34.0)
MCHC: 31.8 g/dL (ref 30.0–36.0)
MCV: 92 fL (ref 80.0–100.0)
Platelets: 281 10*3/uL (ref 150–400)
RBC: 4.48 MIL/uL (ref 4.22–5.81)
RDW: 13.9 % (ref 11.5–15.5)
WBC: 15.9 10*3/uL — ABNORMAL HIGH (ref 4.0–10.5)
nRBC: 0 % (ref 0.0–0.2)

## 2020-03-06 LAB — COMPREHENSIVE METABOLIC PANEL
ALT: 30 U/L (ref 0–44)
AST: 23 U/L (ref 15–41)
Albumin: 4.2 g/dL (ref 3.5–5.0)
Alkaline Phosphatase: 62 U/L (ref 38–126)
Anion gap: 12 (ref 5–15)
BUN: 29 mg/dL — ABNORMAL HIGH (ref 8–23)
CO2: 23 mmol/L (ref 22–32)
Calcium: 9.5 mg/dL (ref 8.9–10.3)
Chloride: 106 mmol/L (ref 98–111)
Creatinine, Ser: 1.18 mg/dL (ref 0.61–1.24)
GFR calc Af Amer: >60 mL/min (ref >60)
GFR calc non Af Amer: >60 mL/min (ref >60)
Glucose, Bld: 27 mg/dL — CL (ref 70–99)
Potassium: 4 mmol/L (ref 3.5–5.1)
Sodium: 141 mmol/L (ref 135–145)
Total Bilirubin: 0.6 mg/dL (ref 0.3–1.2)
Total Protein: 7.6 g/dL (ref 6.5–8.1)

## 2020-03-06 LAB — GLUCOSE, CAPILLARY
Glucose-Capillary: 106 mg/dL — ABNORMAL HIGH (ref 70–99)
Glucose-Capillary: 23 mg/dL — CL (ref 70–99)
Glucose-Capillary: 63 mg/dL — ABNORMAL LOW (ref 70–99)
Glucose-Capillary: 88 mg/dL (ref 70–99)
Glucose-Capillary: 115 mg/dL — ABNORMAL HIGH (ref 70–99)
Glucose-Capillary: 188 mg/dL — ABNORMAL HIGH (ref 70–99)

## 2020-03-06 MED ORDER — DEXTROSE 50 % IV SOLN
1.0000 | Freq: Once | INTRAVENOUS | Status: AC
  Administered 2020-03-06: 50 mL via INTRAVENOUS

## 2020-03-06 NOTE — ED Provider Notes (Signed)
[facility] Emergency Department Provider Note  Time seen: 11:29 AM  I have reviewed the triage vital signs and the nursing notes.   HISTORY  Chief Complaint Hypoglycemia   HPI [name] is a 74 y.o. male with a past medical history of depression, diabetes, hypertension, hyperlipidemia, CVA, presents to the emergency department for hypoglycemia.  According to the patient he states he fell asleep last night woke up around 2:00 in the morning and took his evening medications which include Lantus.  States he woke up this morning and took his morning medications without checking his blood glucose.  Patient had a doctor's appointment at 10 AM so he did not eat breakfast.  States he drank a small amount of orange juice before going to his doctor's appointment.  States just before arrival he began feeling unwell, began feeling very weak he checked his blood glucose it was 23 today we will the patient to the emergency department.  Upon arrival patient is fatigued and weak appearing appears to be mildly confused.  Received an amp of D50.  Shortly after dextrose patient is awake alert, answering questions and following commands.  States he feels much better.   Past Medical History:  Diagnosis Date  . BPH (benign prostatic hyperplasia)   . Depression   . Diabetes mellitus without complication ([place])   . HLD (hyperlipidemia)   . Hypertension   . Neuropathy   . PTSD (post-traumatic stress disorder)   . Stroke [facility])     Patient Active Problem List   Diagnosis Date Noted  . Stable angina ([place]) 10/13/2016  . Acute CVA (cerebrovascular accident) ([place]) 06/29/2016  . Stroke ([place]) 06/28/2016  . Elevated troponin 06/27/2016  . HTN (hypertension) 06/27/2016  . HLD (hyperlipidemia) 06/27/2016  . Type 2 diabetes mellitus ([place]) 06/27/2016  . BPH (benign prostatic hyperplasia) 06/27/2016  . Extremity numbness 06/27/2016  . AKI (acute kidney injury) ([place]) 06/27/2016  .  Vitamin D deficiency 03/30/2016    Past Surgical History:  Procedure Laterality Date  . BYPASS GRAFT    . CARDIAC CATHETERIZATION Left 10/28/2016   Procedure: Left Heart Cath and Coronary Angiography;  Surgeon: Corey Skains, MD;  Location: [facility];  Service: Cardiovascular;  Laterality: Left;  . SHOULDER ARTHROSCOPY    . UVULOPALATOPHARYNGOPLASTY, TONSILLECTOMY AND SEPTOPLASTY      Prior to Admission medications   Medication Sig Start Date End Date Taking? Authorizing Provider  aspirin 81 MG chewable tablet Chew 1 tablet (81 mg total) by mouth before cath procedure. 10/29/16   Corey Skains, MD  cholecalciferol (VITAMIN D) 1000 UNITS tablet Take 1,000 Units by mouth daily.    [provider]  Continuous Blood Gluc Sensor (FREESTYLE LIBRE 14 DAY SENSOR) MISC Use 1 kit every 14 (fourteen) days 06/01/18   [provider]  finasteride (PROSCAR) 5 MG tablet Take 5 mg by mouth daily.    [provider]  gabapentin (NEURONTIN) 100 MG capsule Take by mouth. 01/24/18   [provider]  insulin aspart (NOVOLOG) 100 UNIT/ML injection Inject 20 Units into the skin 3 (three) times daily before meals.    [provider]  insulin aspart (NOVOLOG) 100 UNIT/ML injection Inject into the skin. 05/03/18   [provider]  insulin glargine (LANTUS) 100 UNIT/ML injection Inject 75 Units into the skin at bedtime. 07/28/16   [provider]  lisinopril (PRINIVIL,ZESTRIL) 20 MG tablet Take 20 mg by mouth daily. 09/15/16   [provider]  metFORMIN (GLUCOPHAGE) 850 MG tablet Take 850 mg by mouth 3 (three) times daily.    [provider]  nortriptyline (PAMELOR) 10 MG capsule Take 30 mg by mouth at bedtime. Patient taking differently 09/15/16   [provider]  PARoxetine (PAXIL) 20 MG tablet Take 1 tablet (20 mg total) by mouth daily. To be taken with 40 mg 06/27/18   Ursula Alert, MD  PARoxetine (PAXIL) 40 MG  tablet Take 1 tablet (40 mg total) by mouth every morning. To be taken with 20 mg 06/27/18   Ursula Alert, MD  prazosin (MINIPRESS) 5 MG capsule Take 5 mg by mouth 2 (two) times daily.     [provider]  rOPINIRole (REQUIP) 0.5 MG tablet Take 0.5 mg by mouth at bedtime.    [provider]  simvastatin (ZOCOR) 40 MG tablet Take 40 mg by mouth at bedtime.    [provider]  sucralfate (CARAFATE) 1 g tablet Take 1 tablet (1 g total) by mouth 4 (four) times daily. 02/05/16   Nance Pear, MD  tamsulosin (FLOMAX) 0.4 MG CAPS capsule Take 0.4 mg by mouth daily.    [provider]  tiZANidine (ZANAFLEX) 2 MG tablet Take 2 mg by mouth at bedtime.    [provider]  traMADol-acetaminophen (ULTRACET) 37.5-325 MG tablet Take 1 tablet by mouth every 6 (six) hours as needed.  07/10/16   [provider]  vitamin B-12 (CYANOCOBALAMIN) 1000 MCG tablet Take 1,000 mcg by mouth at bedtime.    [provider]    No Known Allergies  Family History  Problem Relation Age of Onset  . Hypertension Unknown   . Diabetes Mellitus II Unknown   . Diabetes Mother   . Diabetes Sister     Social History Social History   Tobacco Use  . Smoking status: Never Smoker  . Smokeless tobacco: Never Used  Substance Use Topics  . Alcohol use: No    Alcohol/week: 0.0 standard drinks  . Drug use: No    Review of Systems Constitutional: Negative for fever. Cardiovascular: Negative for chest pain. Respiratory: Negative for shortness of breath. Gastrointestinal: Negative for abdominal pain, vomiting and diarrhea. Genitourinary: Negative for urinary compaints Musculoskeletal: Negative for musculoskeletal complaints Neurological: Negative for headache All other ROS negative  ____________________________________________   PHYSICAL EXAM:  VITAL SIGNS: ED Triage Vitals  Enc Vitals Group     BP 03/06/20 1104 (!) 161/69     Pulse Rate 03/06/20 1104 70      Resp 03/06/20 1105 (!) 22     Temp 03/06/20 1104 (!) 97.5 F (36.4 C)     Temp Source 03/06/20 1104 Oral     SpO2 03/06/20 1104 92 %     Weight 03/06/20 1104 184 lb (83.5 kg)     Height 03/06/20 1104 6' (1.829 m)     Head Circumference --      Peak Flow --      Pain Score --      Pain Loc --      Pain Edu? --      Excl. in [place]? --    Constitutional: Alert and oriented. Well appearing and in no distress. Eyes: Normal exam ENT      Head: Normocephalic and atraumatic.      Mouth/Throat: Mucous membranes are moist. Cardiovascular: Normal rate, regular rhythm.  Respiratory: Normal respiratory effort without tachypnea nor retractions. Breath sounds are clear Gastrointestinal: Soft and nontender. No distention. Musculoskeletal: Nontender with normal range  of motion in all extremities.  Neurologic:  Normal speech and language. No gross focal neurologic deficits  Skin:  Skin is warm, dry and intact.  Psychiatric: Mood and affect are normal.   ____________________________________________    EKG  EKG viewed and interpreted by myself appears to show sinus arrhythmia versus flutter at 70 bpm.  Nonspecific ST changes.  Slightly widened QRS, normal axis.  ____________________________________________    RADIOLOGY  Did not appear to show any acute finding.  ____________________________________________   INITIAL IMPRESSION / ASSESSMENT AND PLAN / ED COURSE  Pertinent labs & imaging results that were available during my care of the patient were reviewed by me and considered in my medical decision making (see chart for details).   Patient presents to the emergency department with hypoglycemia.  Patient states he fell asleep last night and woke up around 2 AM to take his nighttime medications.  Patient then woke up this morning took his morning medications.  Did not check his blood glucose this morning, did not eat breakfast and went to his doctor's appointment.  Patient blood glucose  of 23, received an amp of D50 repeat blood glucose of 106.  Patient states he feels much better, answering questions appropriately and following commands appropriately.  Patient has a reassuring physical exam.  We will continue to closely monitor fingersticks in the emergency department.  We will check labs.  Patient's lab work is overall reassuring.  Initially hypoglycemic but has maintained largely normal blood glucose fingersticks since that time.  Patient now eating a meal.  Patient states he was supposed to go to his doctor today for x-rays of his chest.  I ordered x-rays of his chest and abdomen as a precaution here, no acute findings.  Spoke with the patient's daughter regarding today's findings.  Patient appears well highly suspect the patient's hypoglycemic episode was due to taking his evening insulin very late, his morning insulin this morning and night).  Discussed with the patient need to take his insulin Lantus preferably 9 PM earlier.  Also discussed patient needs to check his blood glucose more frequently especially prior to giving insulin.  [name] was evaluated in Emergency Department on 03/06/2020 for the symptoms described in the history of present illness. He was evaluated in the context of the global COVID-19 pandemic, which necessitated consideration that the patient might be at risk for infection with the SARS-CoV-2 virus that causes COVID-19. Institutional protocols and algorithms that pertain to the evaluation of patients at risk for COVID-19 are in a state of rapid change based on information released by regulatory bodies including the CDC and federal and state organizations. These policies and algorithms were followed during the patient's care in the ED.  ____________________________________________   FINAL CLINICAL IMPRESSION(S) / ED DIAGNOSES  Hypoglycemia   Harvest Dark, MD 03/06/20 1437

## 2020-03-06 NOTE — ED Notes (Signed)
Unable to obtain E-sig at this time due to E-sig pad not working. Pt taken to lobby via wheelchair to wait for his daughter in law/son to come pick him up. EDP Kinner aware pt currently in A-fib states okay for D/C. Pt denies comments/concerns regarding D/C instructions.

## 2020-03-06 NOTE — ED Notes (Signed)
RN has requested STAT meal from [facility].via phone at this time.

## 2020-03-06 NOTE — ED Notes (Signed)
RN provided Ensure to consume and will call Nutrition Services to acquire meal.

## 2020-03-06 NOTE — ED Triage Notes (Signed)
Patient presented to ED from Triage with altered mental status that continued to decrease. CBG was 23. IV established, 1 Amp of D50 given. Patient CBG is now above 100. Patient is AOx4, ambulatory. No pain or distress.

## 2020-03-06 NOTE — ED Notes (Signed)
Patient has been provided soda and peanut-butter and crackers to consume.

## 2020-03-18 ENCOUNTER — Inpatient Hospital Stay
Admission: EM | Admit: 2020-03-18 | Discharge: 2020-03-20 | DRG: 638 | Disposition: A | Discharge status: Home / Self Care | Payer: Medicare Other | Attending: Internal Medicine | Admitting: Internal Medicine

## 2020-03-18 ENCOUNTER — Emergency Department: Payer: Medicare Other

## 2020-03-18 ENCOUNTER — Encounter: Payer: Self-pay | Admitting: Emergency Medicine

## 2020-03-18 ENCOUNTER — Other Ambulatory Visit: Payer: Self-pay

## 2020-03-18 ENCOUNTER — Inpatient Hospital Stay: Payer: Medicare Other

## 2020-03-18 DIAGNOSIS — I4892 Unspecified atrial flutter: Secondary | ICD-10-CM

## 2020-03-18 DIAGNOSIS — E785 Hyperlipidemia, unspecified: Secondary | ICD-10-CM | POA: Diagnosis present

## 2020-03-18 DIAGNOSIS — F4310 Post-traumatic stress disorder, unspecified: Secondary | ICD-10-CM | POA: Diagnosis present

## 2020-03-18 DIAGNOSIS — E669 Obesity, unspecified: Secondary | ICD-10-CM | POA: Diagnosis present

## 2020-03-18 DIAGNOSIS — R42 Dizziness and giddiness: Secondary | ICD-10-CM

## 2020-03-18 DIAGNOSIS — Z8673 Personal history of transient ischemic attack (TIA), and cerebral infarction without residual deficits: Secondary | ICD-10-CM

## 2020-03-18 DIAGNOSIS — Z20822 Contact with and (suspected) exposure to covid-19: Secondary | ICD-10-CM | POA: Diagnosis present

## 2020-03-18 DIAGNOSIS — I10 Essential (primary) hypertension: Secondary | ICD-10-CM | POA: Diagnosis present

## 2020-03-18 DIAGNOSIS — Z7982 Long term (current) use of aspirin: Secondary | ICD-10-CM | POA: Diagnosis not present

## 2020-03-18 DIAGNOSIS — L97529 Non-pressure chronic ulcer of other part of left foot with unspecified severity: Secondary | ICD-10-CM | POA: Diagnosis present

## 2020-03-18 DIAGNOSIS — I2510 Atherosclerotic heart disease of native coronary artery without angina pectoris: Secondary | ICD-10-CM | POA: Diagnosis present

## 2020-03-18 DIAGNOSIS — E11621 Type 2 diabetes mellitus with foot ulcer: Secondary | ICD-10-CM | POA: Diagnosis present

## 2020-03-18 DIAGNOSIS — N400 Enlarged prostate without lower urinary tract symptoms: Secondary | ICD-10-CM | POA: Diagnosis present

## 2020-03-18 DIAGNOSIS — I4891 Unspecified atrial fibrillation: Secondary | ICD-10-CM | POA: Diagnosis present

## 2020-03-18 DIAGNOSIS — Z794 Long term (current) use of insulin: Secondary | ICD-10-CM

## 2020-03-18 DIAGNOSIS — I4510 Unspecified right bundle-branch block: Secondary | ICD-10-CM | POA: Diagnosis present

## 2020-03-18 DIAGNOSIS — Z6835 Body mass index (BMI) 35.0-35.9, adult: Secondary | ICD-10-CM | POA: Diagnosis not present

## 2020-03-18 DIAGNOSIS — F329 Major depressive disorder, single episode, unspecified: Secondary | ICD-10-CM | POA: Diagnosis present

## 2020-03-18 DIAGNOSIS — E11649 Type 2 diabetes mellitus with hypoglycemia without coma: Principal | ICD-10-CM | POA: Diagnosis present

## 2020-03-18 DIAGNOSIS — Z833 Family history of diabetes mellitus: Secondary | ICD-10-CM | POA: Diagnosis not present

## 2020-03-18 DIAGNOSIS — N179 Acute kidney failure, unspecified: Secondary | ICD-10-CM | POA: Diagnosis present

## 2020-03-18 DIAGNOSIS — R55 Syncope and collapse: Secondary | ICD-10-CM | POA: Diagnosis not present

## 2020-03-18 DIAGNOSIS — I081 Rheumatic disorders of both mitral and tricuspid valves: Secondary | ICD-10-CM | POA: Diagnosis present

## 2020-03-18 DIAGNOSIS — E162 Hypoglycemia, unspecified: Secondary | ICD-10-CM

## 2020-03-18 DIAGNOSIS — Z951 Presence of aortocoronary bypass graft: Secondary | ICD-10-CM | POA: Diagnosis not present

## 2020-03-18 DIAGNOSIS — Z8249 Family history of ischemic heart disease and other diseases of the circulatory system: Secondary | ICD-10-CM | POA: Diagnosis not present

## 2020-03-18 DIAGNOSIS — Z79899 Other long term (current) drug therapy: Secondary | ICD-10-CM

## 2020-03-18 DIAGNOSIS — E1140 Type 2 diabetes mellitus with diabetic neuropathy, unspecified: Secondary | ICD-10-CM | POA: Diagnosis present

## 2020-03-18 LAB — CBC
HCT: 41.8 % (ref 39.0–52.0)
Hemoglobin: 13.2 g/dL (ref 13.0–17.0)
MCH: 29.1 pg (ref 26.0–34.0)
MCHC: 31.6 g/dL (ref 30.0–36.0)
MCV: 92.1 fL (ref 80.0–100.0)
Platelets: 241 10*3/uL (ref 150–400)
RBC: 4.54 MIL/uL (ref 4.22–5.81)
RDW: 14.2 % (ref 11.5–15.5)
WBC: 13.7 10*3/uL — ABNORMAL HIGH (ref 4.0–10.5)
nRBC: 0 % (ref 0.0–0.2)

## 2020-03-18 LAB — GLUCOSE, CAPILLARY
Glucose-Capillary: 56 mg/dL — ABNORMAL LOW (ref 70–99)
Glucose-Capillary: 133 mg/dL — ABNORMAL HIGH (ref 70–99)
Glucose-Capillary: 64 mg/dL — ABNORMAL LOW (ref 70–99)
Glucose-Capillary: 205 mg/dL — ABNORMAL HIGH (ref 70–99)
Glucose-Capillary: 209 mg/dL — ABNORMAL HIGH (ref 70–99)

## 2020-03-18 LAB — URINALYSIS, COMPLETE (UACMP) WITH MICROSCOPIC
Bacteria, UA: NONE SEEN
Bilirubin Urine: NEGATIVE
Glucose, UA: NEGATIVE mg/dL
Hgb urine dipstick: NEGATIVE
Ketones, ur: NEGATIVE mg/dL
Leukocytes,Ua: NEGATIVE
Nitrite: NEGATIVE
Protein, ur: 100 mg/dL — AB
Specific Gravity, Urine: 1.01 (ref 1.005–1.030)
Squamous Epithelial / HPF: NONE SEEN (ref 0–5)
pH: 6 (ref 5.0–8.0)

## 2020-03-18 LAB — BASIC METABOLIC PANEL
Anion gap: 9 (ref 5–15)
BUN: 20 mg/dL (ref 8–23)
CO2: 26 mmol/L (ref 22–32)
Calcium: 9.4 mg/dL (ref 8.9–10.3)
Chloride: 105 mmol/L (ref 98–111)
Creatinine, Ser: 1.34 mg/dL — ABNORMAL HIGH (ref 0.61–1.24)
GFR calc Af Amer: 60 mL/min — ABNORMAL LOW (ref >60)
GFR calc non Af Amer: 51 mL/min — ABNORMAL LOW (ref >60)
Glucose, Bld: 70 mg/dL (ref 70–99)
Potassium: 4.9 mmol/L (ref 3.5–5.1)
Sodium: 140 mmol/L (ref 135–145)

## 2020-03-18 LAB — BRAIN NATRIURETIC PEPTIDE: B Natriuretic Peptide: 155 pg/mL — ABNORMAL HIGH (ref 0.0–100.0)

## 2020-03-18 LAB — TROPONIN I (HIGH SENSITIVITY): Troponin I (High Sensitivity): 11 ng/L (ref <18)

## 2020-03-18 MED ORDER — ACETAMINOPHEN 650 MG RE SUPP: 650.0000 mg | Freq: Four times a day (QID) | RECTAL | Status: DC | PRN

## 2020-03-18 MED ORDER — ACETAMINOPHEN 325 MG PO TABS: 650.0000 mg | ORAL_TABLET | Freq: Four times a day (QID) | ORAL | Status: DC | PRN

## 2020-03-18 MED ORDER — SODIUM CHLORIDE 0.9% FLUSH
3.0000 mL | Freq: Two times a day (BID) | INTRAVENOUS | Status: DC
  Administered 2020-03-19 – 2020-03-20 (×2): 3 mL via INTRAVENOUS

## 2020-03-18 MED ORDER — DEXTROSE-NACL 5-0.45 % IV SOLN: INTRAVENOUS | Status: DC

## 2020-03-18 MED ORDER — ONDANSETRON HCL 4 MG/2ML IJ SOLN: 4.0000 mg | Freq: Four times a day (QID) | INTRAMUSCULAR | Status: DC | PRN

## 2020-03-18 MED ORDER — ENOXAPARIN SODIUM 40 MG/0.4ML ~~LOC~~ SOLN
40.0000 mg | SUBCUTANEOUS | Status: DC
  Administered 2020-03-18: 23:00:00 40 mg via SUBCUTANEOUS
  Filled 2020-03-18: qty 0.4

## 2020-03-18 MED ORDER — ALUM & MAG HYDROXIDE-SIMETH 200-200-20 MG/5ML PO SUSP: 30.0000 mL | Freq: Four times a day (QID) | ORAL | Status: DC | PRN

## 2020-03-18 MED ORDER — SENNOSIDES-DOCUSATE SODIUM 8.6-50 MG PO TABS: 1.0000 | ORAL_TABLET | Freq: Every evening | ORAL | Status: DC | PRN

## 2020-03-18 MED ORDER — INSULIN ASPART 100 UNIT/ML ~~LOC~~ SOLN
0.0000 [IU] | Freq: Three times a day (TID) | SUBCUTANEOUS | Status: DC
  Administered 2020-03-19: 8 [IU] via SUBCUTANEOUS
  Administered 2020-03-19 (×2): 5 [IU] via SUBCUTANEOUS
  Administered 2020-03-20: 12:00:00 8 [IU] via SUBCUTANEOUS
  Filled 2020-03-18 (×4): qty 1

## 2020-03-18 MED ORDER — ONDANSETRON HCL 4 MG PO TABS: 4.0000 mg | ORAL_TABLET | Freq: Four times a day (QID) | ORAL | Status: DC | PRN

## 2020-03-18 NOTE — Discharge Instructions (Addendum)
Thank you for letting us take care of you in the emergency department today.   Please continue to take any regular, prescribed medications.  Please call your endocrinologist tomorrow to discuss the fact that your sugars dropped again. They may need to adjust your medication regimen.  Please return to the ER for any new or worsening symptoms.

## 2020-03-18 NOTE — ED Provider Notes (Signed)
[facility] Emergency Department Provider Note  ____________________________________________   First MD Initiated Contact with Patient 03/18/20 1652     (approximate)  I have reviewed the triage vital signs and the nursing notes.  History  Chief Complaint Altered Mental Status    HPI [name] is a 74 y.o. male with hx as below, including CAD s/p CABG, HTN, HLD, DM, who presents for AMS and hypoglycemia.  Patient was being seen at his scheduled podiatry clinic appointment this morning.  States he remembers walking out of the office, sitting down in the lobby, trying to decide where he was going to get lunch.  The next thing he remembers he was in a room, with several nurses checking in on him.  He was reportedly hypoglycemic, glucose in triage 56.  No reported trauma.  On my evaluation, patient is eating and has no acute complaints.  Prior to going to his appointment today, he reports feeling otherwise well and at his baseline.  No new chest pain, shortness of breath, abdominal pain, nausea, vomiting, diarrhea, urinary symptoms over the last several days.    He reports compliance with his insulin regimen (followed by [name] of Endocrinology), which includes PM Lantus (140 units) and TID Novolog (40 units). He denies any changes to his dosages. Took his night time Lantus yesterday evening as prescribed. Took his AM Novolog before his appointment and ate breakfast. His DM regimen also includes Trulicity 1.5 mg weekly (taken on Sundays, did take yesterday, but he doesn't think he got the full dose because he didn't bleed like normal with administration and didn't feel as painful as it normally does with the needle). Also takes metformin. No sulfonylureas.   He does report ongoing SOB with exertion for the last few weeks, which he is being worked up by Dr. [name] for. He denies any SOB currently. No chest pain at rest or with exertion. Baseline LE swelling  that is unchanged.   Past Medical Hx Past Medical History:  Diagnosis Date   BPH (benign prostatic hyperplasia)    Depression    Diabetes mellitus without complication ([place])    HLD (hyperlipidemia)    Hypertension    Neuropathy    PTSD (post-traumatic stress disorder)    Stroke [facility])     Problem List Patient Active Problem List   Diagnosis Date Noted   Stable angina ([place]) 10/13/2016   Acute CVA (cerebrovascular accident) ([place]) 06/29/2016   Stroke ([place]) 06/28/2016   Elevated troponin 06/27/2016   HTN (hypertension) 06/27/2016   HLD (hyperlipidemia) 06/27/2016   Type 2 diabetes mellitus ([place]) 06/27/2016   BPH (benign prostatic hyperplasia) 06/27/2016   Extremity numbness 06/27/2016   AKI (acute kidney injury) ([place]) 06/27/2016   Vitamin D deficiency 03/30/2016    Past Surgical Hx Past Surgical History:  Procedure Laterality Date   BYPASS GRAFT     CARDIAC CATHETERIZATION Left 10/28/2016   Procedure: Left Heart Cath and Coronary Angiography;  Surgeon: Corey Skains, MD;  Location: [facility];  Service: Cardiovascular;  Laterality: Left;   SHOULDER ARTHROSCOPY     UVULOPALATOPHARYNGOPLASTY, TONSILLECTOMY AND SEPTOPLASTY      Medications Prior to Admission medications   Medication Sig Start Date End Date Taking? Authorizing Provider  amoxicillin-clavulanate (AUGMENTIN) 875-125 MG tablet Take 1 tablet by mouth 2 (two) times daily. 03/08/20   [provider]  aspirin 81 MG chewable tablet Chew 1 tablet (81 mg total) by mouth before cath procedure. 10/29/16  Corey Skains, MD  cholecalciferol (VITAMIN D) 1000 UNITS tablet Take 1,000 Units by mouth daily.    [provider]  Continuous Blood Gluc Sensor (FREESTYLE LIBRE 14 DAY SENSOR) MISC Use 1 kit every 14 (fourteen) days 06/01/18   [provider]  Dulaglutide (TRULICITY) 1.5 JE/5.6DJ SOPN Inject 1.5 mg into the skin.    [provider]  finasteride  (PROSCAR) 5 MG tablet Take 5 mg by mouth daily.    [provider]  furosemide (LASIX) 20 MG tablet Take 40 mg by mouth daily. 02/22/20   [provider]  gabapentin (NEURONTIN) 100 MG capsule Take by mouth 3 (three) times daily.  01/24/18   [provider]  Icosapent Ethyl 0.5 g CAPS Take 2 capsules by mouth 2 (two) times daily.    [provider]  insulin aspart (NOVOLOG) 100 UNIT/ML injection Inject 40 Units into the skin 3 (three) times daily before meals.     [provider]  insulin glargine (LANTUS) 100 UNIT/ML injection Inject 75 Units into the skin at bedtime. 07/28/16   [provider]  lisinopril (ZESTRIL) 40 MG tablet Take 40 mg by mouth daily.  09/15/16   [provider]  metFORMIN (GLUCOPHAGE) 850 MG tablet Take 850 mg by mouth 2 (two) times daily with a meal.     [provider]  nortriptyline (PAMELOR) 10 MG capsule Take 30 mg by mouth at bedtime. Patient taking differently 09/15/16   [provider]  PARoxetine (PAXIL) 20 MG tablet Take 1 tablet (20 mg total) by mouth daily. To be taken with 40 mg 06/27/18   Ursula Alert, MD  PARoxetine (PAXIL) 40 MG tablet Take 1 tablet (40 mg total) by mouth every morning. To be taken with 20 mg 06/27/18   Ursula Alert, MD  rOPINIRole (REQUIP) 0.5 MG tablet Take 0.5 mg by mouth at bedtime.    [provider]  simvastatin (ZOCOR) 40 MG tablet Take 40 mg by mouth at bedtime.    [provider]  tamsulosin (FLOMAX) 0.4 MG CAPS capsule Take 0.4 mg by mouth daily.    [provider]  traMADol-acetaminophen (ULTRACET) 37.5-325 MG tablet Take 1 tablet by mouth every 6 (six) hours as needed.  07/10/16   [provider]  vitamin B-12 (CYANOCOBALAMIN) 1000 MCG tablet Take 1,000 mcg by mouth at bedtime.    [provider]    Allergies Patient has no known allergies.  Family Hx Family History  Problem Relation Age of Onset    Hypertension Other    Diabetes Mellitus II Other    Diabetes Mother    Diabetes Sister     Social Hx Social History   Tobacco Use   Smoking status: Never Smoker   Smokeless tobacco: Never Used  Substance Use Topics   Alcohol use: No    Alcohol/week: 0.0 standard drinks   Drug use: No     Review of Systems  Constitutional: Negative for fever, chills. Eyes: Negative for visual changes. ENT: Negative for sore throat. Cardiovascular: Negative for chest pain. Respiratory: Negative for shortness of breath. Gastrointestinal: Negative for nausea, vomiting.  Genitourinary: Negative for dysuria. Musculoskeletal: Negative for leg swelling. Skin: Negative for rash. Neurological: Negative for headaches. + AMS, hypoglycemia   Physical Exam  Vital Signs: ED Triage Vitals  Enc Vitals Group     BP 03/18/20 1814 (!) 187/77     Pulse Rate 03/18/20 1814 65     Resp 03/18/20 1814 (!) 24  Temp 03/18/20 1814 97.8 F (36.6 C)     Temp Source 03/18/20 1814 Oral     SpO2 03/18/20 1814 98 %     Weight 03/18/20 1621 200 lb (90.7 kg)     Height 03/18/20 1621 6' (1.829 m)     Head Circumference --      Peak Flow --      Pain Score 03/18/20 1621 0     Pain Loc --      Pain Edu? --      Excl. in [place]? --      Constitutional: Alert and oriented. Well appearing.  Head: Normocephalic. Atraumatic. Eyes: Conjunctivae clear, sclera anicteric. Pupils equal and symmetric. Nose: No masses or lesions. No congestion or rhinorrhea. Mouth/Throat: Wearing mask.  Neck: No stridor. Trachea midline.  Cardiovascular: Normal rate, regular rhythm. Extremities well perfused. Respiratory: Normal respiratory effort.  Lungs CTAB. Gastrointestinal: Soft. Non-distended. Non-tender.  Genitourinary: Deferred. Musculoskeletal: Trace symmetric lower extremity edema. No deformities. Neurologic:  Normal speech and language. No gross focal or lateralizing neurologic deficits are appreciated.  Skin: Skin  is warm, dry and intact. No rash noted. Psychiatric: Mood and affect are appropriate for situation.  EKG  Personally reviewed and interpreted by myself.   Rate: 64 Rhythm: atrial flutter Axis: normal Intervals: RBBB > wide QRS, QTc 501 ms Atrial flutter with 4:1 conduction No STEMI    Radiology   N/A   Procedures  Procedure(s) performed (including critical care):  Procedures   Initial Impression / Assessment and Plan / MDM / ED Course  74 y.o. male who presents to the ED for syncope/LOC and hypoglycemia, as above  Ddx includes hypoglycemia, electrolyte abnormality, arrhythmia, amongst others  Will obtain labs, EKG, encourage PO and monitor with close glucose checks  Initial glucose 56, then rose to 64, 70, and 133 with eating.  Remainder of labs without actionable derangements.  Glucose has remained stable, however his EKG appears to be AF with 4:1 conduction, which appears to be new. Given new onset arrhythmia + syncope in the setting of his cardiac hx, will admit for further monitoring and telemetry (may benefit from adjustment of his insulin regimen as well). Discussed with Dr. [name] of cardiology who is aware. Patient and family agreeable. Discussed w/ hospitalist for admission.    _______________________________  As part of my medical decision making I have reviewed available labs, radiology tests, reviewed old records.  Final Clinical Impression(s) / ED Diagnosis  Final diagnoses:  Hypoglycemia  Atrial flutter by electrocardiogram [facility])       Note:  This document was prepared using Dragon voice recognition software and may include unintentional dictation errors.   Lilia Pro., MD 03/18/20 2020

## 2020-03-18 NOTE — H&P (Signed)
History and Physical    [name] [id number] DOB: 08/10/1946 DOA: 03/18/2020  PCP: [name], MD   Patient coming from: home I have personally briefly reviewed patient's old medical records in [place]  Chief Complaint: confusion, syncope  HPI: [name] is a 74 y.o. male with medical history significant for CAD status post CABG, followed by Dr. 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  Patient states that he was sitting in the lobby following his office visit and the next thing he remembered was that he was in a room with nurses checking on him.  His blood sugar was 56.  He denied preceding symptoms such as chest pain, shortness of breath, lightheadedness, numbness or weakness or tingling on one side of the body, denied nausea vomiting or diaphoresis.  Felt like he was in his usual state of health. he recently saw his endocrinologist and reported no changes to his usual insulin regimen with which she is compliant.  He did report that he has felt a fluttering in his chest before associated with lightheadedness but has never passed out.  He also reports increasing dyspnea on exertion over the past couple months.  Denies chest pain.  Says his cardiologist are aware of the symptoms and he had a echocardiogram in the past month.  ED Course: Upon arrival in the emergency room he was awake alert oriented x3.  Vitals were within normal limits except for slightly elevated blood pressure of 187/77.  On his EKG was noted to be in atrial flutter 41 with a right bundle branch block.  Troponin and BNP pending at the time of admission.  Other blood work-up significant for creatinine of 1.34, up from baseline of 1.13.  Serum blood sugar was 70.  The emergency room provider spoke with cardiologist Dr. [name] who recommended  observing patient given possibility that slow a flutter may have contributed to syncopal event.  Hospitalist consulted for admission  Review of Systems: As per HPI otherwise 10 point review of systems negative.    Past Medical History:  Diagnosis Date  . BPH (benign prostatic hyperplasia)   . Depression   . Diabetes mellitus without complication ([place])   . HLD (hyperlipidemia)   . Hypertension   . Neuropathy   . PTSD (post-traumatic stress disorder)   . Stroke [facility])     Past Surgical History:  Procedure Laterality Date  . BYPASS GRAFT    . CARDIAC CATHETERIZATION Left 10/28/2016   Procedure: Left Heart Cath and Coronary Angiography;  Surgeon: Corey Skains, MD;  Location: [facility];  Service: Cardiovascular;  Laterality: Left;  . SHOULDER ARTHROSCOPY    . UVULOPALATOPHARYNGOPLASTY, TONSILLECTOMY AND SEPTOPLASTY       reports that he has never smoked. He has never used smokeless tobacco. He reports that he does not drink alcohol or use drugs.  No Known Allergies  Family History  Problem Relation Age of Onset  . Hypertension Other   . Diabetes Mellitus II Other   . Diabetes Mother   . Diabetes Sister      Prior to Admission medications   Medication Sig Start Date End Date Taking? Authorizing Provider  amoxicillin-clavulanate (AUGMENTIN) 875-125 MG tablet Take 1 tablet by mouth 2 (two) times daily. 03/08/20  Yes [provider]  aspirin 81 MG chewable tablet Chew 1 tablet (81  mg total) by mouth before cath procedure. 10/29/16  Yes Corey Skains, MD  cholecalciferol (VITAMIN D) 1000 UNITS tablet Take 1,000 Units by mouth daily.   Yes [provider]  Dulaglutide (TRULICITY) 1.5 0000000 SOPN Inject 1.5 mg into the skin.   Yes [provider]  furosemide (LASIX) 20 MG tablet Take 40 mg by mouth daily. 02/22/20  Yes [provider]  insulin aspart (NOVOLOG) 100 UNIT/ML injection Inject 40 Units into the skin 3 (three) times  daily before meals.    Yes [provider]  insulin glargine (LANTUS) 100 UNIT/ML injection Inject 140 Units into the skin at bedtime.  07/28/16  Yes [provider]  lisinopril (ZESTRIL) 40 MG tablet Take 40 mg by mouth daily.  09/15/16  Yes [provider]  metFORMIN (GLUCOPHAGE) 850 MG tablet Take 850 mg by mouth 2 (two) times daily with a meal.    Yes [provider]  simvastatin (ZOCOR) 40 MG tablet Take 40 mg by mouth at bedtime.   Yes [provider]  vitamin B-12 (CYANOCOBALAMIN) 1000 MCG tablet Take 1,000 mcg by mouth at bedtime.   Yes [provider]  finasteride (PROSCAR) 5 MG tablet Take 5 mg by mouth daily.    [provider]  Icosapent Ethyl 0.5 g CAPS Take 2 capsules by mouth 2 (two) times daily.    [provider]  nortriptyline (PAMELOR) 10 MG capsule Take 30 mg by mouth at bedtime. Patient taking differently 09/15/16   [provider]  PARoxetine (PAXIL) 20 MG tablet Take 1 tablet (20 mg total) by mouth daily. To be taken with 40 mg 06/27/18   Ursula Alert, MD  PARoxetine (PAXIL) 40 MG tablet Take 1 tablet (40 mg total) by mouth every morning. To be taken with 20 mg 06/27/18   Ursula Alert, MD  rOPINIRole (REQUIP) 0.5 MG tablet Take 0.5 mg by mouth at bedtime.    [provider]  tamsulosin (FLOMAX) 0.4 MG CAPS capsule Take 0.4 mg by mouth daily.    [provider]  traMADol-acetaminophen (ULTRACET) 37.5-325 MG tablet Take 1 tablet by mouth every 6 (six) hours as needed.  07/10/16   [provider]    Physical Exam: Vitals:   03/18/20 1621 03/18/20 1814  BP:  (!) 187/77  Pulse:  65  Resp:  (!) 24  Temp:  97.8 F (36.6 C)  TempSrc:  Oral  SpO2:  98%  Weight: 90.7 kg   Height: 6' (1.829 m)      Vitals:   03/18/20 1621 03/18/20 1814  BP:  (!) 187/77  Pulse:  65  Resp:  (!) 24  Temp:  97.8 F (36.6 C)  TempSrc:  Oral  SpO2:  98%  Weight: 90.7 kg   Height: 6'  (1.829 m)     Constitutional: Alert and awake, oriented x3, not in any acute distress. Eyes: PERLA, EOMI, irises appear normal, anicteric sclera,  ENMT: external ears and nose appear normal, normal hearing             Lips appears normal, oropharynx mucosa, tongue, posterior pharynx appear normal  Neck: neck appears normal, no masses, normal ROM, no thyromegaly, no JVD  CVS: S1-S2 clear, no murmur rubs or gallops,  , no carotid bruits, pedal pulses palpable, trace LE edema Respiratory: diminished bilaterally, no wheezing, rales or rhonchi. Respiratory effort normal. No accessory muscle use.  Abdomen: soft nontender, nondistended, normal bowel sounds, no hepatosplenomegaly, no hernias Musculoskeletal: : no  cyanosis, clubbing , no contractures or atrophy Neuro: Cranial nerves II-XII intact, sensation, reflexes normal, strength Psych: judgement and insight appear normal, stable mood and affect,  Skin: Diabetic foot ulcer left foot   Labs on Admission: I have personally reviewed following labs and imaging studies  CBC: Recent Labs  Lab 03/18/20 1638  WBC 13.7*  HGB 13.2  HCT 41.8  MCV 92.1  PLT [id number]   Basic Metabolic Panel: Recent Labs  Lab 03/18/20 1638  NA 140  K 4.9  CL 105  CO2 26  GLUCOSE 70  BUN 20  CREATININE 1.34*  CALCIUM 9.4   GFR: Estimated Creatinine Clearance: 52.3 mL/min (A) (by C-G formula based on SCr of 1.34 mg/dL (H)). Liver Function Tests: No results for input(s): AST, ALT, ALKPHOS, BILITOT, PROT, ALBUMIN in the last 168 hours. No results for input(s): LIPASE, AMYLASE in the last 168 hours. No results for input(s): AMMONIA in the last 168 hours. Coagulation Profile: No results for input(s): INR, PROTIME in the last 168 hours. Cardiac Enzymes: No results for input(s): CKTOTAL, CKMB, CKMBINDEX, TROPONINI in the last 168 hours. BNP (last 3 results) No results for input(s): PROBNP in the last 8760 hours. HbA1C: No results for input(s): HGBA1C in the  last 72 hours. CBG: Recent Labs  Lab 03/18/20 1622 03/18/20 1636 03/18/20 1720 03/18/20 1905  GLUCAP 56* 64* 133* 205*   Lipid Profile: No results for input(s): CHOL, HDL, LDLCALC, TRIG, CHOLHDL, LDLDIRECT in the last 72 hours. Thyroid Function Tests: No results for input(s): TSH, T4TOTAL, FREET4, T3FREE, THYROIDAB in the last 72 hours. Anemia Panel: No results for input(s): VITAMINB12, FOLATE, FERRITIN, TIBC, IRON, RETICCTPCT in the last 72 hours. Urine analysis:    Component Value Date/Time   COLORURINE YELLOW (A) 03/18/2020 1759   APPEARANCEUR CLEAR (A) 03/18/2020 1759   LABSPEC 1.010 03/18/2020 1759   PHURINE 6.0 03/18/2020 1759   GLUCOSEU NEGATIVE 03/18/2020 1759   HGBUR NEGATIVE 03/18/2020 1759   BILIRUBINUR NEGATIVE 03/18/2020 1759   KETONESUR NEGATIVE 03/18/2020 1759   PROTEINUR 100 (A) 03/18/2020 1759   NITRITE NEGATIVE 03/18/2020 1759   LEUKOCYTESUR NEGATIVE 03/18/2020 1759    Radiological Exams on Admission: DG Chest 2 View  Result Date: 03/18/2020 CLINICAL DATA:  New onset atrial fibrillation. Altered mental status. EXAM: CHEST - 2 VIEW COMPARISON:  Chest radiograph 03/06/2020 FINDINGS: Post median sternotomy. Stable cardiomegaly. Mediastinal contours are unchanged. Vascular congestion similar to slightly improved from prior. No pulmonary edema. No focal airspace disease. No pleural effusion. No pneumothorax. No acute osseous abnormalities are seen. IMPRESSION: Stable cardiomegaly. Vascular congestion similar to slightly improved from prior exam. Electronically Signed   By: Keith Rake M.D.   On: 03/18/2020 19:52    EKG: Independently reviewed.   Assessment/Plan    Postural dizziness with presyncope -Etiology could be related to his new onset atrial flutter, hypoglycemia, history of CVA and CAD -Work-up to include continuous cardiac monitoring, echocardiogram, serial cardiac biomarkers, new onset atrial flutter ([place]), carotid Doppler as well as CT  head  Atrial flutter with controlled ventricular rate -Work-up as described above -CHA2DS2-VASc of 3 given age and history of hypertension -for further discussion on anticoagulant options based on patient preference/insurance coverage --Conversation was initiated regarding systemic anticoagulation for stroke prevention -   AKI (acute kidney injury) ([place]) -Gentle IV hydration with D5 half NS given hypoglycemia -Monitor renal function and avoid nephrotoxins    Hypoglycemia associated with type 2 diabetes mellitus ([place]) -Home hypoglycemic agents -IV hydration with D5 half  NS -Follow A1c -Patient follows with endocrinologist Dr. Mickie Kay  Diabetic foot ulcer left foot -Followed by podiatry    CAD with Hx of CABG -No complaints of chest pain -In the setting of syncopal event, will cycle enzymes to evaluate for ACS -follow troponins -Continue aspirin, simvastatin -Patient follows with Dr. [name] outpatient    History of CVA (cerebrovascular accident) -Aspirin and simvastatin    DVT prophylaxis: Lovenox  Code Status: full code  Family Communication:  none  Disposition Plan: Back to previous home environment Consults called: none  Status:inp    Athena Masse MD [facility]     03/18/2020, 8:08 PM

## 2020-03-18 NOTE — ED Provider Notes (Signed)
MSE was initiated and I personally evaluated the patient and placed orders (if any) at  4:42 PM on March 18, 2020.  S: present to the ED with c/o AMS. Patient notes he was in the waiting room of the podiatrist for a visit. He was apparently brought over after being less than alert in the waiting room. He was found to have a CBG of 56 mg/dl on triage.  Patient recently reports having his Lantus decreased to 100 units.   O: alert to person, time, and place. NAD CVS: RRR No murmurs, rubs, or gallops.  Lungs: CTA. No wheeze, rales, rhonchi. Skin: warm, dry  A/P: Hypoglycemia, AMS  Patient evaluated and initial labs drawn. Patient will be further evaluated and plan of care determined by ED provider.   The patient appears stable so that the remainder of the MSE may be completed by another provider.   Melvenia Needles, PA-C 03/18/20 2353    Lilia Pro., MD 03/19/20 [id number]

## 2020-03-18 NOTE — ED Triage Notes (Signed)
Pt was seen at podiatry this am, got confused and has been sitting in the lobby there since 11am. Here a week ago for AMS and hypoglycemia. Appears pale, alert and orientedx3. CBG in triage 56.

## 2020-03-18 NOTE — ED Notes (Signed)
EDP at bedside at this time, pt visualized in NAD at this time, resting in bed eating meal tray.

## 2020-03-19 ENCOUNTER — Encounter: Payer: Self-pay | Admitting: Internal Medicine

## 2020-03-19 ENCOUNTER — Inpatient Hospital Stay
Admit: 2020-03-19 | Discharge: 2020-03-19 | Disposition: A | Discharge status: Home / Self Care | Payer: Medicare Other | Attending: Internal Medicine | Admitting: Internal Medicine

## 2020-03-19 ENCOUNTER — Inpatient Hospital Stay: Payer: Medicare Other

## 2020-03-19 ENCOUNTER — Other Ambulatory Visit: Payer: Self-pay

## 2020-03-19 DIAGNOSIS — N179 Acute kidney failure, unspecified: Secondary | ICD-10-CM

## 2020-03-19 DIAGNOSIS — Z951 Presence of aortocoronary bypass graft: Secondary | ICD-10-CM

## 2020-03-19 DIAGNOSIS — Z8673 Personal history of transient ischemic attack (TIA), and cerebral infarction without residual deficits: Secondary | ICD-10-CM

## 2020-03-19 LAB — CBC
HCT: 36 % — ABNORMAL LOW (ref 39.0–52.0)
Hemoglobin: 11.6 g/dL — ABNORMAL LOW (ref 13.0–17.0)
MCH: 29.7 pg (ref 26.0–34.0)
MCHC: 32.2 g/dL (ref 30.0–36.0)
MCV: 92.1 fL (ref 80.0–100.0)
Platelets: 190 10*3/uL (ref 150–400)
RBC: 3.91 MIL/uL — ABNORMAL LOW (ref 4.22–5.81)
RDW: 14 % (ref 11.5–15.5)
WBC: 10 10*3/uL (ref 4.0–10.5)
nRBC: 0 % (ref 0.0–0.2)

## 2020-03-19 LAB — BASIC METABOLIC PANEL
Anion gap: 7 (ref 5–15)
BUN: 20 mg/dL (ref 8–23)
CO2: 25 mmol/L (ref 22–32)
Calcium: 8.6 mg/dL — ABNORMAL LOW (ref 8.9–10.3)
Chloride: 106 mmol/L (ref 98–111)
Creatinine, Ser: 1.23 mg/dL (ref 0.61–1.24)
GFR calc Af Amer: >60 mL/min (ref >60)
GFR calc non Af Amer: 57 mL/min — ABNORMAL LOW (ref >60)
Glucose, Bld: 240 mg/dL — ABNORMAL HIGH (ref 70–99)
Potassium: 4.6 mmol/L (ref 3.5–5.1)
Sodium: 138 mmol/L (ref 135–145)

## 2020-03-19 LAB — TROPONIN I (HIGH SENSITIVITY): Troponin I (High Sensitivity): 11 ng/L (ref <18)

## 2020-03-19 LAB — GLUCOSE, CAPILLARY
Glucose-Capillary: 213 mg/dL — ABNORMAL HIGH (ref 70–99)
Glucose-Capillary: 234 mg/dL — ABNORMAL HIGH (ref 70–99)
Glucose-Capillary: 269 mg/dL — ABNORMAL HIGH (ref 70–99)
Glucose-Capillary: 223 mg/dL — ABNORMAL HIGH (ref 70–99)
Glucose-Capillary: 270 mg/dL — ABNORMAL HIGH (ref 70–99)

## 2020-03-19 LAB — ECHOCARDIOGRAM COMPLETE
Height: 72 in
Weight: 3200 oz

## 2020-03-19 LAB — HEMOGLOBIN A1C
Hgb A1c MFr Bld: 7.2 % — ABNORMAL HIGH (ref 4.8–5.6)
Mean Plasma Glucose: 159.94 mg/dL

## 2020-03-19 LAB — SARS CORONAVIRUS 2 (TAT 6-24 HRS): SARS Coronavirus 2: NEGATIVE

## 2020-03-19 MED ORDER — ICOSAPENT ETHYL 1 G PO CAPS: 2.0000 g | ORAL_CAPSULE | Freq: Two times a day (BID) | ORAL | Status: DC

## 2020-03-19 MED ORDER — INSULIN GLARGINE 100 UNIT/ML ~~LOC~~ SOLN
60.0000 [IU] | Freq: Every day | SUBCUTANEOUS | Status: DC
  Filled 2020-03-19: qty 0.6

## 2020-03-19 MED ORDER — TAMSULOSIN HCL 0.4 MG PO CAPS
0.4000 mg | ORAL_CAPSULE | Freq: Every day | ORAL | Status: DC
  Administered 2020-03-19 – 2020-03-20 (×2): 0.4 mg via ORAL
  Filled 2020-03-19 (×2): qty 1

## 2020-03-19 MED ORDER — LISINOPRIL 20 MG PO TABS
40.0000 mg | ORAL_TABLET | Freq: Every day | ORAL | Status: DC
  Administered 2020-03-19 – 2020-03-20 (×2): 40 mg via ORAL
  Filled 2020-03-19 (×2): qty 2

## 2020-03-19 MED ORDER — GABAPENTIN 100 MG PO CAPS
100.0000 mg | ORAL_CAPSULE | Freq: Three times a day (TID) | ORAL | Status: DC
  Administered 2020-03-19 (×2): 100 mg via ORAL
  Filled 2020-03-19 (×2): qty 1

## 2020-03-19 MED ORDER — ROPINIROLE HCL 0.25 MG PO TABS
0.5000 mg | ORAL_TABLET | Freq: Every day | ORAL | Status: DC
  Administered 2020-03-19: 21:00:00 0.5 mg via ORAL
  Filled 2020-03-19 (×2): qty 2

## 2020-03-19 MED ORDER — INSULIN GLARGINE 100 UNIT/ML ~~LOC~~ SOLN
80.0000 [IU] | Freq: Every day | SUBCUTANEOUS | Status: DC
  Administered 2020-03-19: 80 [IU] via SUBCUTANEOUS
  Filled 2020-03-19 (×2): qty 0.8

## 2020-03-19 MED ORDER — NORTRIPTYLINE HCL 10 MG PO CAPS
30.0000 mg | ORAL_CAPSULE | Freq: Every day | ORAL | Status: DC
  Administered 2020-03-19: 21:00:00 30 mg via ORAL
  Filled 2020-03-19 (×2): qty 3

## 2020-03-19 MED ORDER — VITAMIN D 25 MCG (1000 UNIT) PO TABS
1000.0000 [IU] | ORAL_TABLET | Freq: Every day | ORAL | Status: DC
  Administered 2020-03-19 – 2020-03-20 (×2): 1000 [IU] via ORAL
  Filled 2020-03-19 (×2): qty 1

## 2020-03-19 MED ORDER — PAROXETINE HCL 30 MG PO TABS
50.0000 mg | ORAL_TABLET | Freq: Every day | ORAL | Status: DC
  Administered 2020-03-20: 50 mg via ORAL
  Filled 2020-03-19: qty 1

## 2020-03-19 MED ORDER — METOPROLOL TARTRATE 25 MG PO TABS
25.0000 mg | ORAL_TABLET | Freq: Two times a day (BID) | ORAL | Status: DC
  Administered 2020-03-19 – 2020-03-20 (×3): 25 mg via ORAL
  Filled 2020-03-19 (×3): qty 1

## 2020-03-19 MED ORDER — DOCUSATE SODIUM 100 MG PO CAPS: 100.0000 mg | ORAL_CAPSULE | Freq: Every day | ORAL | Status: DC | PRN

## 2020-03-19 MED ORDER — ATORVASTATIN CALCIUM 20 MG PO TABS
40.0000 mg | ORAL_TABLET | Freq: Every day | ORAL | Status: DC
  Administered 2020-03-19: 40 mg via ORAL
  Filled 2020-03-19: qty 2

## 2020-03-19 MED ORDER — VITAMIN B-12 1000 MCG PO TABS
1000.0000 ug | ORAL_TABLET | Freq: Every day | ORAL | Status: DC
  Administered 2020-03-19: 1000 ug via ORAL
  Filled 2020-03-19 (×2): qty 1

## 2020-03-19 MED ORDER — FINASTERIDE 5 MG PO TABS
5.0000 mg | ORAL_TABLET | Freq: Every day | ORAL | Status: DC
  Administered 2020-03-19 – 2020-03-20 (×2): 5 mg via ORAL
  Filled 2020-03-19 (×3): qty 1

## 2020-03-19 MED ORDER — APIXABAN 5 MG PO TABS
5.0000 mg | ORAL_TABLET | Freq: Two times a day (BID) | ORAL | Status: DC
  Administered 2020-03-19 – 2020-03-20 (×2): 5 mg via ORAL
  Filled 2020-03-19 (×2): qty 1

## 2020-03-19 MED ORDER — FUROSEMIDE 40 MG PO TABS
40.0000 mg | ORAL_TABLET | Freq: Every day | ORAL | Status: DC
  Administered 2020-03-19 – 2020-03-20 (×2): 40 mg via ORAL
  Filled 2020-03-19 (×2): qty 1

## 2020-03-19 NOTE — Progress Notes (Signed)
Received report from [employer] from the ED. Patient transferred up to rm 256. No complaints of pain or dizziness, VSS. Educated patient on safety and how to call for help. Patient resting in bed comfortably.

## 2020-03-19 NOTE — Progress Notes (Signed)
Progress Note    [name]  [id number] DOB: [birth year]  DOA: 03/18/2020 PCP: [name], MD      Brief Narrative:    Medical records reviewed and are as summarized below:  [name] is an 74 y.o. male [name] is a 74 y.o. male with medical history significant for CAD status post CABG, followed by Dr. 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  Patient states that he was sitting in the lobby following his office visit and the next thing he remembered was that he was in a room with nurses checking on him.  His blood sugar was 56.  He said about a week ago he passed out and his blood sugar was in the 30s.  His endocrinologist told him to 100 units of Lantus at night instead of the usual 140 units. He did report that he has felt a fluttering in his chest before associated with lightheadedness but has never passed out.  He also reports increasing dyspnea on exertion over the past couple months.        Assessment/Plan:   Active Problems:   AKI (acute kidney injury) ([place])   Postural dizziness with presyncope   Hypoglycemia associated with type 2 diabetes mellitus ([place])   New onset atrial flutter ([facility])   Hx of CABG   History of CVA (cerebrovascular accident)    Postural dizziness with presyncope -Etiology could be related to atrial flutter vs hypoglycemia.  Atrial flutter with controlled ventricular rate -CHA2DS2-VASc of 5 given age, hypertension and h/o stroke -He has been evaluated by Dr. [name], cardiologist,who recommended Eliquis for stroke prophylaxis.  2D echo showed EF estimated at 55 to [id number] and LV diastolic parameters were normal.  Mild to moderate MR, mild to moderate TR. -   AKI (acute kidney injury) ([place]) -Creatinine improved.  Discontinue IV fluids.    Hypoglycemia  associated with type 2 diabetes mellitus ([facility]) -Decrease Lantus from 140 units nightly to 60 units nightly and monitor glucose closely. -Discontinue dextrose containing IV fluids. -Hemoglobin A1c was 7.2. -Outpatient follow-up with endocrinologist, [name].  Diabetic foot ulcer left foot -Outpatient follow-up with podiatry    CAD with Hx of CABG -No complaints of chest pain.  Troponins negative. -Substitute Eliquis for aspirin -     History of CVA (cerebrovascular accident) -He has been started on Eliquis.      Body mass index is 27.12 kg/m.   Family Communication/Anticipated D/C date and plan/Code Status   DVT prophylaxis: Eliquis Code Status: Full code Family Communication: Plan discussed with patient Disposition Plan: Patient is from home.  Possible discharge to home tomorrow if glucose levels are stabilized on insulin      Subjective:   No complaints. No dizziness, palpitation, chest pain  Objective:    Vitals:   03/18/20 2213 03/19/20 0030 03/19/20 0100 03/19/20 0800  BP: (!) 156/67 (!) 195/114 (!) 182/135 (!) 163/81  Pulse: 72 66 88   Resp: 20 (!) 21  16  Temp:      TempSrc:      SpO2: 97% 96%  96%  Weight:      Height:        Intake/Output Summary (Last 24 hours) at 03/19/2020 1223 Last data filed at 03/18/2020 2214 Gross per 24 hour  Intake --  Output 450 ml  Net -  450 ml   Filed Weights   03/18/20 1621  Weight: 90.7 kg    Exam:  GEN: NAD SKIN: wound on plantar aspect of leftt big toe is clean without drainage EYES: EOMI ENT: MMM CV: RRR PULM: CTA B ABD: soft, obese, NT, +BS CNS: AAO x 3, non focal EXT: No edema or tenderness   Data Reviewed:   I have personally reviewed following labs and imaging studies:  Labs: Labs show the following:   Basic Metabolic Panel: Recent Labs  Lab 03/18/20 1638 03/19/20 0550  NA 140 138  K 4.9 4.6  CL 105 106  CO2 26 25  GLUCOSE 70 240*  BUN 20 20  CREATININE 1.34* 1.23  CALCIUM  9.4 8.6*   GFR Estimated Creatinine Clearance: 57 mL/min (by C-G formula based on SCr of 1.23 mg/dL). Liver Function Tests: No results for input(s): AST, ALT, ALKPHOS, BILITOT, PROT, ALBUMIN in the last 168 hours. No results for input(s): LIPASE, AMYLASE in the last 168 hours. No results for input(s): AMMONIA in the last 168 hours. Coagulation profile No results for input(s): INR, PROTIME in the last 168 hours.  CBC: Recent Labs  Lab 03/18/20 1638 03/19/20 0550  WBC 13.7* 10.0  HGB 13.2 11.6*  HCT 41.8 36.0*  MCV 92.1 92.1  PLT 241 190   Cardiac Enzymes: No results for input(s): CKTOTAL, CKMB, CKMBINDEX, TROPONINI in the last 168 hours. BNP (last 3 results) No results for input(s): PROBNP in the last 8760 hours. CBG: Recent Labs  Lab 03/18/20 1720 03/18/20 1905 03/18/20 2325 03/19/20 0555 03/19/20 0910  GLUCAP 133* 205* 209* 213* 234*   D-Dimer: No results for input(s): DDIMER in the last 72 hours. Hgb A1c: Recent Labs    03/19/20 0550  HGBA1C 7.2*   Lipid Profile: No results for input(s): CHOL, HDL, LDLCALC, TRIG, CHOLHDL, LDLDIRECT in the last 72 hours. Thyroid function studies: No results for input(s): TSH, T4TOTAL, T3FREE, THYROIDAB in the last 72 hours.  Invalid input(s): FREET3 Anemia work up: No results for input(s): VITAMINB12, FOLATE, FERRITIN, TIBC, IRON, RETICCTPCT in the last 72 hours. Sepsis Labs: Recent Labs  Lab 03/18/20 1638 03/19/20 0550  WBC 13.7* 10.0    Microbiology Recent Results (from the past 240 hour(s))  SARS CORONAVIRUS 2 (TAT 6-24 HRS) Nasopharyngeal Nasopharyngeal Swab     Status: None   Collection Time: 03/18/20  8:18 PM   Specimen: Nasopharyngeal Swab  Result Value Ref Range Status   SARS Coronavirus 2 NEGATIVE NEGATIVE Final    Comment: (NOTE) SARS-CoV-2 target nucleic acids are NOT DETECTED. The SARS-CoV-2 RNA is generally detectable in upper and lower respiratory specimens during the acute phase of infection.  Negative results do not preclude SARS-CoV-2 infection, do not rule out co-infections with other pathogens, and should not be used as the sole basis for treatment or other patient management decisions. Negative results must be combined with clinical observations, patient history, and epidemiological information. The expected result is Negative. Fact Sheet for Patients: SugarRoll.be Fact Sheet for Healthcare Providers: https://www.woods-mathews.com/ This test is not yet approved or cleared by the [place] FDA and  has been authorized for detection and/or diagnosis of SARS-CoV-2 by FDA under an Emergency Use Authorization (EUA). This EUA will remain  in effect (meaning this test can be used) for the duration of the COVID-19 declaration under Section 56 4(b)(1) of the Act, 21 U.S.C. section 360bbb-3(b)(1), unless the authorization is terminated or revoked sooner. Performed at [facility], [place] [address],  [place], [place] [address]     Procedures and diagnostic studies:  DG Chest 2 View  Result Date: 03/18/2020 CLINICAL DATA:  New onset atrial fibrillation. Altered mental status. EXAM: CHEST - 2 VIEW COMPARISON:  Chest radiograph 03/06/2020 FINDINGS: Post median sternotomy. Stable cardiomegaly. Mediastinal contours are unchanged. Vascular congestion similar to slightly improved from prior. No pulmonary edema. No focal airspace disease. No pleural effusion. No pneumothorax. No acute osseous abnormalities are seen. IMPRESSION: Stable cardiomegaly. Vascular congestion similar to slightly improved from prior exam. Electronically Signed   By: Keith Rake M.D.   On: 03/18/2020 19:52   CT HEAD WO CONTRAST  Result Date: 03/18/2020 CLINICAL DATA:  Syncope, confusion, history of altered level of consciousness EXAM: CT HEAD WITHOUT CONTRAST TECHNIQUE: Contiguous axial images were obtained from the base of the skull through the vertex without  intravenous contrast. COMPARISON:  07/12/2016 FINDINGS: Brain: Chronic ischemic changes are seen within the left corona radiata. Chronic lacunar infarct within the left thalamus. Encephalomalacia left cerebellar hemisphere. No signs of acute infarct or hemorrhage. Lateral ventricles and midline structures are unremarkable. No acute extra-axial fluid collections. No mass effect. Vascular: No hyperdense vessel or unexpected calcification. Skull: Normal. Negative for fracture or focal lesion. Sinuses/Orbits: Mild mucoperiosteal thickening right maxillary sinus. Remaining sinuses are clear. Other: None IMPRESSION: 1. Multifocal chronic ischemic changes as above. 2. No acute intracranial process. Electronically Signed   By: Randa Ngo M.D.   On: 03/18/2020 21:12   US Carotid Bilateral  Result Date: 03/19/2020 CLINICAL DATA:  74 year old male with a history of syncope EXAM: BILATERAL CAROTID DUPLEX ULTRASOUND TECHNIQUE: Pearline Cables scale imaging, color Doppler and duplex ultrasound were performed of bilateral carotid and vertebral arteries in the neck. COMPARISON:  06/29/2016 FINDINGS: Criteria: Quantification of carotid stenosis is based on velocity parameters that correlate the residual internal carotid diameter with NASCET-based stenosis levels, using the diameter of the distal internal carotid lumen as the denominator for stenosis measurement. The following velocity measurements were obtained: RIGHT ICA:  Systolic 79 cm/sec, Diastolic 25 cm/sec CCA:  78 cm/sec SYSTOLIC ICA/CCA RATIO:  1.0 ECA:  77 cm/sec LEFT ICA:  Systolic 74 cm/sec, Diastolic 17 cm/sec CCA:  86 cm/sec SYSTOLIC ICA/CCA RATIO:  1.2 ECA:  2 94 cm/sec Right Brachial SBP: Not acquired Left Brachial SBP: Not acquired RIGHT CAROTID ARTERY: No significant calcifications of the right common carotid artery. Intermediate waveform maintained. Heterogeneous and partially calcified plaque at the right carotid bifurcation. No significant lumen shadowing. Low  resistance waveform of the right ICA. No significant tortuosity. RIGHT VERTEBRAL ARTERY: Antegrade flow with high resistance waveform. LEFT CAROTID ARTERY: No significant calcifications of the left common carotid artery. Intermediate waveform maintained. Heterogeneous and partially calcified plaque at the left carotid bifurcation without significant lumen shadowing. Low resistance waveform of the left ICA. No significant tortuosity. LEFT VERTEBRAL ARTERY: Antegrade flow with high resistance waveform. IMPRESSION: Color duplex indicates minimal heterogeneous and calcified plaque, with no hemodynamically significant stenosis by duplex criteria in the extracranial cerebrovascular circulation. High resistance waveform of the bilateral vertebral arteries, indicating distal stenoses, potentially of either bilateral vertebral arteries or the basilar artery. Given the patient's symptoms of syncope, further evaluation of the vasculature may be considered with either CT angiogram or formal cerebral angiogram. Signed, Dulcy Fanny. Dellia Nims, RPVI [facility] Electronically Signed   By: Corrie Mckusick D.O.   On: 03/19/2020 09:16    Medications:   . enoxaparin (LOVENOX) injection  40 mg Subcutaneous Q24H  .  insulin aspart  0-15 Units Subcutaneous TID WC  . sodium chloride flush  3 mL Intravenous Q12H   Continuous Infusions: . dextrose 5 % and 0.45% NaCl 50 mL/hr at 03/18/20 2124     LOS: 1 day   Jeriko Kowalke  [facility]     03/19/2020, 12:23 PM

## 2020-03-19 NOTE — Progress Notes (Signed)
*  PRELIMINARY RESULTS* Echocardiogram 2D Echocardiogram has been performed.  Sherrie Sport 03/19/2020, 10:11 AM

## 2020-03-19 NOTE — ED Notes (Signed)
Pt ambulated to toilet with one person assist

## 2020-03-19 NOTE — ED Notes (Signed)
Patient's daughter, Arrie Aran given update on patient.  Daughter expressed concerns regarding patient being discharged home to live by himself.  States patient has had increasing confusion and has been missing appointment and not taking diabetes medication at the correct time and causing dangerously low blood sugars.

## 2020-03-19 NOTE — Progress Notes (Signed)
Inpatient Diabetes Program Recommendations  AACE/ADA: New Consensus Statement on Inpatient Glycemic Control (2015)  Target Ranges:  Prepandial:   less than 140 mg/dL      Peak postprandial:   less than 180 mg/dL (1-2 hours)      Critically ill patients:  140 - 180 mg/dL   Lab Results  Component Value Date   GLUCAP 213 (H) 03/19/2020   HGBA1C 13.3 (H) 06/27/2016    Review of Glycemic Control  Diabetes history: DM2 Outpatient Diabetes medications: Lantus 140 units QHS, Novolog 40 units tid before meals, metformin 123456 mg bid, Trulicity 1.5 mg Q weekly. Current orders for Inpatient glycemic control: Novolog 0-15 units tidwc Admitted with blood sugar of 56, 64, 70.  HgbA1C - 13/3% on 06/27/16 Needs updating. Blood sugar 240 mg/dL this am  Inpatient Diabetes Program Recommendations:     Add portion of basal insulin - Lantus 40 units QHS Needs updated HgbA1C.  Follow closely.  Thank you. Lorenda Peck, RD, LDN, CDE Inpatient Diabetes Coordinator [phone]

## 2020-03-19 NOTE — Consult Note (Signed)
[facility] Consultation Note  Patient ID: [name], MRN: [id number], DOB/AGE: [birth year] 75 y.o. Admit date: 03/18/2020   Date of Consult: 03/19/2020 Primary Physician: [name], MD Primary Cardiologist: [name]  Chief Complaint:  Chief Complaint  Patient presents with  . Altered Mental Status   Reason for Consult: Atrial flutter  HPI: 74 y.o. male with known coronary disease status post previous coronary bypass graft diabetes hypertension hyperlipidemia and previous cerebrovascular accident who has done relatively well on appropriate medication management of all these issues.  The patient recently has had some confusion weakness fatigue and slight dizziness.  There has been no evidence of frank syncope.  The patient was seen in the emergency room with low blood sugar and has had symptoms at night consistent with the possibility of a low blood sugar.  Therefore the patient may need adjustments of his diabetes medication management.  Incidentally the patient also has had an EKG showing atrial flutter with slow ventricular rate but no evidence of heart block at this time.  Therefore he may need other adjustments in medication management for this atrial flutter.  Currently he is feeling well with no evidence of significant symptoms of chest pain congestive heart failure or syncope.  Blood pressure has improved since admission  Past Medical History:  Diagnosis Date  . BPH (benign prostatic hyperplasia)   . Depression   . Diabetes mellitus without complication ([place])   . HLD (hyperlipidemia)   . Hypertension   . Neuropathy   . PTSD (post-traumatic stress disorder)   . Stroke [facility])    [place]ke in 2017 and mulitiple TIAs since per daughter      Surgical History:  Past Surgical History:  Procedure Laterality Date  . BYPASS GRAFT    . CARDIAC CATHETERIZATION Left 10/28/2016   Procedure: Left Heart Cath and Coronary Angiography;  Surgeon: Corey Skains,  MD;  Location: [facility];  Service: Cardiovascular;  Laterality: Left;  . SHOULDER ARTHROSCOPY    . UVULOPALATOPHARYNGOPLASTY, TONSILLECTOMY AND SEPTOPLASTY       Home Meds: Prior to Admission medications   Medication Sig Start Date End Date Taking? Authorizing Provider  amoxicillin-clavulanate (AUGMENTIN) 875-125 MG tablet Take 1 tablet by mouth 2 (two) times daily. 03/08/20  Yes [provider]  aspirin EC 81 MG tablet Take 81 mg by mouth daily.   Yes [provider]  cholecalciferol (VITAMIN D) 1000 UNITS tablet Take 1,000 Units by mouth daily.   Yes [provider]  docusate sodium (COLACE) 100 MG capsule Take 100 mg by mouth daily as needed for mild constipation.   Yes [provider]  Dulaglutide (TRULICITY) 1.5 0000000 SOPN Inject 1.5 mg into the skin.   Yes [provider]  finasteride (PROSCAR) 5 MG tablet Take 5 mg by mouth daily.   Yes [provider]  furosemide (LASIX) 20 MG tablet Take 40 mg by mouth daily. 02/22/20  Yes [provider]  gabapentin (NEURONTIN) 100 MG capsule Take 100 mg by mouth 3 (three) times daily.   Yes [provider]  icosapent Ethyl (VASCEPA) 1 g capsule Take 2 capsules by mouth 2 (two) times daily.    Yes [provider]  insulin aspart (NOVOLOG) 100 UNIT/ML injection Inject 40 Units into the skin 3 (three) times daily before meals.    Yes [provider]  insulin glargine (LANTUS) 100 UNIT/ML injection Inject 140 Units into the skin at bedtime.  07/28/16  Yes [provider]  lisinopril (ZESTRIL) 40 MG tablet Take 40 mg by mouth daily.  09/15/16  Yes [provider]  metFORMIN (GLUCOPHAGE) 1000 MG tablet Take 1,000 mg by mouth 2 (two) times daily with a meal.    Yes [provider]  metoprolol tartrate (LOPRESSOR) 25 MG tablet Take 25 mg by mouth 2 (two) times daily.   Yes [provider]  nortriptyline (PAMELOR) 10 MG  capsule Take 30 mg by mouth at bedtime.    Yes [provider]  PARoxetine (PAXIL) 40 MG tablet Take 50 mg by mouth daily.   Yes [provider]  rOPINIRole (REQUIP) 0.5 MG tablet Take 0.5 mg by mouth at bedtime.   Yes [provider]  simvastatin (ZOCOR) 80 MG tablet Take 40 mg by mouth at bedtime.    Yes [provider]  tamsulosin (FLOMAX) 0.4 MG CAPS capsule Take 0.4 mg by mouth daily.   Yes [provider]  vitamin B-12 (CYANOCOBALAMIN) 1000 MCG tablet Take 1,000 mcg by mouth at bedtime.   Yes [provider]    Inpatient Medications:  . enoxaparin (LOVENOX) injection  40 mg Subcutaneous Q24H  . insulin aspart  0-15 Units Subcutaneous TID WC  . sodium chloride flush  3 mL Intravenous Q12H   . dextrose 5 % and 0.45% NaCl 50 mL/hr at 03/18/20 2124    Allergies: No Known Allergies  Social History   Socioeconomic History  . Marital status: Widowed    Spouse name: Not on file  . Number of children: 2  . Years of education: Not on file  . Highest education level: Some college, no degree  Occupational History    Comment: retired  Tobacco Use  . Smoking status: Never Smoker  . Smokeless tobacco: Never Used  Substance and Sexual Activity  . Alcohol use: No    Alcohol/week: 0.0 standard drinks  . Drug use: No  . Sexual activity: Not Currently  Other Topics Concern  . Not on file  Social History Narrative  . Not on file   Social Determinants of Health   Financial Resource Strain:   . Difficulty of Paying Living Expenses:   Food Insecurity:   . Worried About Charity fundraiser in the Last Year:   . Arboriculturist in the Last Year:   Transportation Needs:   . Film/video editor (Medical):   Marland Kitchen Lack of Transportation (Non-Medical):   Physical Activity:   . Days of Exercise per Week:   . Minutes of Exercise per Session:   Stress:   . Feeling of Stress :   Social Connections:   . Frequency of Communication with  Friends and Family:   . Frequency of Social Gatherings with Friends and Family:   . Attends Religious Services:   . Active Member of Clubs or Organizations:   . Attends Archivist Meetings:   Marland Kitchen Marital Status:   Intimate Partner Violence:   . Fear of Current or Ex-Partner:   . Emotionally Abused:   Marland Kitchen Physically Abused:   . Sexually Abused:      Family History  Problem Relation Age of Onset  . Hypertension Other   . Diabetes Mellitus II Other   . Diabetes Mother   . Diabetes Sister   . Atrial fibrillation Sister   . Atrial fibrillation Father   . Atrial fibrillation Daughter      Review of Systems Positive for weakness Negative for: General:  chills, fever, night sweats  or weight changes.  Cardiovascular: PND orthopnea syncope dizziness  Dermatological skin lesions rashes Respiratory: Cough congestion Urologic: Frequent urination urination at night and hematuria Abdominal: negative for nausea, vomiting, diarrhea, bright red blood per rectum, melena, or hematemesis Neurologic: negative for visual changes, and/or hearing changes  All other systems reviewed and are otherwise negative except as noted above.  Labs: No results for input(s): CKTOTAL, CKMB, TROPONINI in the last 72 hours. Lab Results  Component Value Date   WBC 10.0 03/19/2020   HGB 11.6 (L) 03/19/2020   HCT 36.0 (L) 03/19/2020   MCV 92.1 03/19/2020   PLT 190 03/19/2020    Recent Labs  Lab 03/19/20 0550  NA 138  K 4.6  CL 106  CO2 25  BUN 20  CREATININE 1.23  CALCIUM 8.6*  GLUCOSE 240*   Lab Results  Component Value Date   CHOL 200 06/28/2016   HDL 29 (L) 06/28/2016   LDLCALC UNABLE TO CALCULATE IF TRIGLYCERIDE OVER 400 mg/dL 06/28/2016   TRIG 515 (H) 06/28/2016   No results found for: DDIMER  Radiology/Studies:  DG Chest 2 View  Result Date: 03/18/2020 CLINICAL DATA:  New onset atrial fibrillation. Altered mental status. EXAM: CHEST - 2 VIEW COMPARISON:  Chest radiograph  03/06/2020 FINDINGS: Post median sternotomy. Stable cardiomegaly. Mediastinal contours are unchanged. Vascular congestion similar to slightly improved from prior. No pulmonary edema. No focal airspace disease. No pleural effusion. No pneumothorax. No acute osseous abnormalities are seen. IMPRESSION: Stable cardiomegaly. Vascular congestion similar to slightly improved from prior exam. Electronically Signed   By: Keith Rake M.D.   On: 03/18/2020 19:52   CT HEAD WO CONTRAST  Result Date: 03/18/2020 CLINICAL DATA:  Syncope, confusion, history of altered level of consciousness EXAM: CT HEAD WITHOUT CONTRAST TECHNIQUE: Contiguous axial images were obtained from the base of the skull through the vertex without intravenous contrast. COMPARISON:  07/12/2016 FINDINGS: Brain: Chronic ischemic changes are seen within the left corona radiata. Chronic lacunar infarct within the left thalamus. Encephalomalacia left cerebellar hemisphere. No signs of acute infarct or hemorrhage. Lateral ventricles and midline structures are unremarkable. No acute extra-axial fluid collections. No mass effect. Vascular: No hyperdense vessel or unexpected calcification. Skull: Normal. Negative for fracture or focal lesion. Sinuses/Orbits: Mild mucoperiosteal thickening right maxillary sinus. Remaining sinuses are clear. Other: None IMPRESSION: 1. Multifocal chronic ischemic changes as above. 2. No acute intracranial process. Electronically Signed   By: Randa Ngo M.D.   On: 03/18/2020 21:12   US Carotid Bilateral  Result Date: 03/19/2020 CLINICAL DATA:  74 year old male with a history of syncope EXAM: BILATERAL CAROTID DUPLEX ULTRASOUND TECHNIQUE: Pearline Cables scale imaging, color Doppler and duplex ultrasound were performed of bilateral carotid and vertebral arteries in the neck. COMPARISON:  06/29/2016 FINDINGS: Criteria: Quantification of carotid stenosis is based on velocity parameters that correlate the residual internal carotid  diameter with NASCET-based stenosis levels, using the diameter of the distal internal carotid lumen as the denominator for stenosis measurement. The following velocity measurements were obtained: RIGHT ICA:  Systolic 79 cm/sec, Diastolic 25 cm/sec CCA:  78 cm/sec SYSTOLIC ICA/CCA RATIO:  1.0 ECA:  77 cm/sec LEFT ICA:  Systolic 74 cm/sec, Diastolic 17 cm/sec CCA:  86 cm/sec SYSTOLIC ICA/CCA RATIO:  1.2 ECA:  2 94 cm/sec Right Brachial SBP: Not acquired Left Brachial SBP: Not acquired RIGHT CAROTID ARTERY: No significant calcifications of the right common carotid artery. Intermediate waveform maintained. Heterogeneous and partially calcified plaque at the right carotid bifurcation. No significant lumen shadowing.  Low resistance waveform of the right ICA. No significant tortuosity. RIGHT VERTEBRAL ARTERY: Antegrade flow with high resistance waveform. LEFT CAROTID ARTERY: No significant calcifications of the left common carotid artery. Intermediate waveform maintained. Heterogeneous and partially calcified plaque at the left carotid bifurcation without significant lumen shadowing. Low resistance waveform of the left ICA. No significant tortuosity. LEFT VERTEBRAL ARTERY: Antegrade flow with high resistance waveform. IMPRESSION: Color duplex indicates minimal heterogeneous and calcified plaque, with no hemodynamically significant stenosis by duplex criteria in the extracranial cerebrovascular circulation. High resistance waveform of the bilateral vertebral arteries, indicating distal stenoses, potentially of either bilateral vertebral arteries or the basilar artery. Given the patient's symptoms of syncope, further evaluation of the vasculature may be considered with either CT angiogram or formal cerebral angiogram. Signed, Dulcy Fanny. Dellia Nims, RPVI [facility] Electronically Signed   By: Corrie Mckusick D.O.   On: 03/19/2020 09:16   DG Chest Portable 1  View  Result Date: 03/06/2020 CLINICAL DATA:  Left-sided pain. Altered mental status. Hypoglycemia. EXAM: PORTABLE CHEST 1 VIEW COMPARISON:  Chest radiograph 02/05/2016 FINDINGS: Post CABG and median sternotomy. Heart is enlarged. There is vascular congestion. No focal airspace disease. No large pleural effusion. No pneumothorax. No evidence of acute osseous abnormality. IMPRESSION: Cardiomegaly post CABG.  Vascular congestion. Electronically Signed   By: Keith Rake M.D.   On: 03/06/2020 14:16   DG Abd Portable 2 Views  Result Date: 03/06/2020 CLINICAL DATA:  Left-sided pain. Hypoglycemia. Altered mental status. EXAM: PORTABLE ABDOMEN - 2 VIEW COMPARISON:  None. FINDINGS: No free intra-abdominal air. Air-fluid level noted in the stomach. No gaseous small bowel distension. Minimal air within the ascending and sigmoid colon, small volume of stool distally. There is otherwise a paucity of bowel gas. No definite radiopaque calculi. Pelvic calcifications are most consistent with phleboliths. No acute osseous abnormalities are seen. Habitus limits evaluation. IMPRESSION: Nonspecific bowel gas pattern with relative paucity of bowel gas. No free air or evidence of obstruction. Nonspecific air-fluid level in the stomach. Electronically Signed   By: Keith Rake M.D.   On: 03/06/2020 14:18    EKG: Atrial flutter with controlled ventricular rate and nonspecific ST changes  Weights: Filed Weights   03/18/20 1621  Weight: 90.7 kg     Physical Exam: Blood pressure (!) 163/81, pulse 88, temperature 97.8 F (36.6 C), temperature source Oral, resp. rate 16, height 6' (1.829 m), weight 90.7 kg, SpO2 96 %. Body mass index is 27.12 kg/m. General: Well developed, well nourished, in no acute distress. Head eyes ears nose throat: Normocephalic, atraumatic, sclera non-icteric, no xanthomas, nares are without discharge. No apparent thyromegaly and/or mass  Lungs: Normal respiratory effort.  Few wheezes, no  rales, no rhonchi.  Heart: Irregular with normal S1 S2. no murmur gallop, no rub, PMI is normal size and placement, carotid upstroke normal without bruit, jugular venous pressure is normal Abdomen: Soft, non-tender, non-distended with normoactive bowel sounds. No hepatomegaly. No rebound/guarding. No obvious abdominal masses. Abdominal aorta is normal size without bruit Extremities: 1+ edema. no cyanosis, no clubbing, no ulcers  Peripheral : 2+ bilateral upper extremity pulses, 2+ bilateral femoral pulses, 2+ bilateral dorsal pedal pulse Neuro: Alert and oriented. No facial asymmetry. No focal deficit. Moves all extremities spontaneously. Musculoskeletal: Normal muscle tone without kyphosis Psych:  Responds to questions appropriately with a normal affect.    Assessment: 74 year old male with known coronary artery disease status post coronary bypass graft diabetes hypertension hyperlipidemia sleep apnea previous  cerebrovascular accident with issues with dizziness likely secondary to low blood pressure sugar but also could be secondary to atrial flutter with slow ventricular rate without current evidence of myocardial infarction or congestive heart failure  Plan: 1.  Continue adjustments of diabetes medication management for lowering concerns of risk of current symptoms 2.  Patient should have no additional medication management at this time for atrial flutter with controlled ventricular rate 3.  Anticoagulation for further risk reduction in stroke with atrial fibrillation using Eliquis 5 mg twice per day with significant elevated CHADS2 score 4.  Follow-up in the next 1 to 2 days for Holter monitor and further assessment of significant symptoms and need for adjustments of medication management as per above  Signed, Corey Skains M.D. [facility] 03/19/2020, 12:09 PM

## 2020-03-19 NOTE — Progress Notes (Signed)
Ch visited with Pt in response to Order Requisition for an AD. Upon entering room pt was watching TV. Ch asked Pt if he had requested to complete an AD, Pt said no. When Ch asked if Pt had a MPOA, Pt stated that he has one already, and did not want to complete a new AD. Pt said that he just wanted to rest tonight and was looking forward to sleep tonight, because the bed at th ED was not comfortable last night. Pt reported that he was taken care of really well in the ED, and that both the old and the young of the health care team were good to him. He had high praise for hospital staff on current floor too. Pt let Ch know that his wife was taken care of well also here at [facility]. Pt reported that he had come from the [place] where "they did not even change sheets." Ch let Pt know about chaplain availability. Pt was grateful for the visit.

## 2020-03-19 NOTE — ED Notes (Signed)
Pt ambulatory to bathroom independently

## 2020-03-20 DIAGNOSIS — E162 Hypoglycemia, unspecified: Secondary | ICD-10-CM

## 2020-03-20 LAB — GLUCOSE, CAPILLARY
Glucose-Capillary: 250 mg/dL — ABNORMAL HIGH (ref 70–99)
Glucose-Capillary: 229 mg/dL — ABNORMAL HIGH (ref 70–99)
Glucose-Capillary: 272 mg/dL — ABNORMAL HIGH (ref 70–99)

## 2020-03-20 MED ORDER — INSULIN GLARGINE 100 UNIT/ML ~~LOC~~ SOLN: 100.0000 [IU] | Freq: Every day | SUBCUTANEOUS | 0 refills | Status: DC

## 2020-03-20 MED ORDER — INSULIN ASPART 100 UNIT/ML ~~LOC~~ SOLN: 20.0000 [IU] | Freq: Three times a day (TID) | SUBCUTANEOUS | 0 refills | Status: DC

## 2020-03-20 MED ORDER — APIXABAN 5 MG PO TABS: 5.0000 mg | ORAL_TABLET | Freq: Two times a day (BID) | ORAL | 0 refills | Status: DC

## 2020-03-20 NOTE — Plan of Care (Signed)
  Problem: Education: Goal: Knowledge of General Education information will improve Description Including pain rating scale, medication(s)/side effects and non-pharmacologic comfort measures Outcome: Progressing   Problem: Health Behavior/Discharge Planning: Goal: Ability to manage health-related needs will improve Outcome: Progressing   

## 2020-03-21 DIAGNOSIS — I4892 Unspecified atrial flutter: Secondary | ICD-10-CM | POA: Insufficient documentation

## 2020-03-23 NOTE — Discharge Summary (Addendum)
[facility] Discharge Summary   Patient: [name] [id number]  PCP: [name], MD  Date of admission: 03/18/2020   Date of discharge: 03/20/2020      Discharge Diagnoses:  Principal diagnosis Syncope and hypoglycemia.  Active Problems:   AKI (acute kidney injury) ([place])   Postural dizziness with presyncope   Hypoglycemia associated with type 2 diabetes mellitus ([place])   New onset atrial flutter (HCC)   Hx of CABG   History of CVA (cerebrovascular accident)   Admitted From: Home Disposition:  Home   Recommendations for Outpatient Follow-up:  1. PCP: Follow-up with PCP in 1 week 2. Follow up LABS/TEST: None  Follow-up Information    [name], MD. Go on 03/27/2020.   Specialty: Internal Medicine Why: Appointment at 2pm Contact information: [place] [address] [phone]        Judi Cong, MD. Schedule an appointment as soon as possible for a visit in 1 week(s).   Specialty: Endocrinology Contact information: [place] [address] [phone]        Corey Skains, MD. Call in 2 day(s).   Specialty: Cardiology Why: for Holter monitor Contact information: [address] [facility] [place] [address] [phone]          Diet recommendation: Carb modified diet  Activity: The patient is advised to gradually reintroduce usual activities, as tolerated  Discharge Condition: stable  Code Status: Full code   History of present illness: As per the H and P dictated on admission, "[name] is a 74 y.o. male with medical history significant for CAD status post CABG, followed by Dr. 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  Patient states that  he was sitting in the lobby following his office visit and the next thing he remembered was that he was in a room with nurses checking on him.  His blood sugar was 56.  He denied preceding symptoms such as chest pain, shortness of breath, lightheadedness, numbness or weakness or tingling on one side of the body, denied nausea vomiting or diaphoresis.  Felt like he was in his usual state of health. he recently saw his endocrinologist and reported no changes to his usual insulin regimen with which she is compliant.  He did report that he has felt a fluttering in his chest before associated with lightheadedness but has never passed out.  He also reports increasing dyspnea on exertion over the past couple months.  Denies chest pain.  Says his cardiologist are aware of the symptoms and he had a echocardiogram in the past month."  Hospital Course:  Summary of his active problems in the hospital is as following.   Postural dizziness with presyncope -Etiology could be related to atrial flutter vs hypoglycemia.  Atrial flutter with controlled ventricular rate -CHA2DS2-VASc of 5 given age, hypertension and h/o stroke -He has been evaluated by Dr. [name], cardiologist,who recommended Eliquis for stroke prophylaxis.  2D echo showed EF estimated at 55 to [id number] and LV diastolic parameters were normal.  Mild to moderate MR, mild to moderate TR.  AKI (acute kidney injury) ([place]) -Creatinine improved.  Discontinue IV fluids.  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 -Hemoglobin A1c was 7.2. -Outpatient follow-up with endocrinologist, Dr. Gabriel [name].  Diabetic foot ulcer left foot -Outpatient follow-up with podiatry  CAD with Hx of CABG -No complaints of chest pain.  Troponins negative. -Substitute Eliquis for aspirin   History of CVA (cerebrovascular accident) -He has been started on  Eliquis.  Body mass index is 35.56 kg/m.   Patient was ambulatory without any assistance. On the day of the discharge the patient's vitals were stable, and no other acute medical condition were reported by patient. the patient was felt safe to be discharge at Home with no therapy needed on discharge.  Consultants: Cardiology  Procedures: Echocardiogram   Discharge Exam: General: Appear in no distress, no Rash; Oral Mucosa Clear, moist. Cardiovascular: S1 and S2 Present, no Murmur, Respiratory: normal respiratory effort, Bilateral Air entry present and no Crackles, no wheezes Abdomen: Bowel Sound present, Soft and no tenderness, no hernia Extremities: no Pedal edema, no calf tenderness Neurology: alert and oriented to time, place, and person affect appropriate.  Filed Weights   03/18/20 1621 03/19/20 1529 03/20/20 0344  Weight: 90.7 kg 122.3 kg 118.9 kg   Vitals:   03/20/20 0756 03/20/20 1152  BP: (!) 129/50 (!) 141/77  Pulse: 68 71  Resp: 18 18  Temp: 98.4 F (36.9 C) 98.2 F (36.8 C)  SpO2: 96% 94%    DISCHARGE MEDICATION: Allergies as of 03/20/2020   No Known Allergies     Medication List    STOP taking these medications   amoxicillin-clavulanate 875-125 MG tablet Commonly known as: AUGMENTIN   aspirin EC 81 MG tablet     TAKE these medications   apixaban 5 MG Tabs tablet Commonly known as: ELIQUIS Take 1 tablet (5 mg total) by mouth 2 (two) times daily.   cholecalciferol 1000 units tablet Commonly known as: VITAMIN D Take 1,000 Units by mouth daily.   docusate sodium 100 MG capsule Commonly known as: COLACE Take 100 mg by mouth daily as needed for mild constipation.   finasteride 5 MG tablet Commonly known as: PROSCAR Take 5 mg by mouth daily.   furosemide 20 MG tablet Commonly known as: LASIX Take 40 mg by mouth daily.   gabapentin 100 MG capsule Commonly known as: NEURONTIN Take 100 mg by mouth 3 (three) times daily.   icosapent Ethyl 1 g  capsule Commonly known as: VASCEPA Take 2 capsules by mouth 2 (two) times daily.   insulin aspart 100 UNIT/ML injection Commonly known as: novoLOG Inject 20 Units into the skin 3 (three) times daily before meals. What changed: how much to take   insulin glargine 100 UNIT/ML injection Commonly known as: Lantus Inject 1 mL (100 Units total) into the skin at bedtime. What changed: how much to take   lisinopril 40 MG tablet Commonly known as: ZESTRIL Take 40 mg by mouth daily.   metFORMIN 1000 MG tablet Commonly known as: GLUCOPHAGE Take 1,000 mg by mouth 2 (two) times daily with a meal.   metoprolol tartrate 25 MG tablet Commonly known as: LOPRESSOR Take 25 mg by mouth 2 (two) times daily.   nortriptyline 10 MG capsule Commonly known as: PAMELOR Take 30 mg by mouth at bedtime.   PARoxetine 40 MG tablet Commonly known as: PAXIL Take 50 mg by mouth daily.   rOPINIRole 0.5 MG tablet Commonly known as: REQUIP Take 0.5 mg by mouth at bedtime.   simvastatin 80 MG tablet Commonly known as: ZOCOR Take 40 mg by mouth at bedtime.  tamsulosin 0.4 MG Caps capsule Commonly known as: FLOMAX Take 0.4 mg by mouth daily.   Trulicity 1.5 0000000 Sopn Generic drug: Dulaglutide Inject 1.5 mg into the skin.   vitamin B-12 1000 MCG tablet Commonly known as: CYANOCOBALAMIN Take 1,000 mcg by mouth at bedtime.      No Known Allergies Discharge Instructions    Diet - low sodium heart healthy   Complete by: As directed    Discharge instructions   Complete by: As directed    It is important that you read the given instructions as well as go over your medication list with RN to help you understand your care after this hospitalization.  Please follow-up with PCP in 1-2 weeks.  Please note that NO REFILLS for any discharge medications will be authorized once you are discharged, as it is imperative that you return to your primary care physician (or establish a relationship with a  primary care physician if you do not have one) for your aftercare needs so that they can reassess your need for medications and monitor your lab values.  Please request your primary care physician to go over all Hospital Tests and Procedure/Radiological results at the follow up. Please get all Hospital records sent to your PCP by signing hospital release before you go home.   Do not drive, operating heavy machinery, perform activities at heights, swimming or participation in water activities or provide baby sitting services because you were admitted for syncope; until you have been seen by Primary Care Physician or a Neurologist and are cleared to do such activities.  Do not take more than prescribed Pain, Sleep and Anxiety Medications.  You were cared for by a hospitalist during your hospital stay. If you have any questions about your discharge medications or the care you received while you were in the hospital after you are discharged, you can call the unit @UNIT @ you were admitted to and ask to speak with the hospitalist Berle Mull. Ask for Hospitalist on call if the hospitalist that took care of you is not available.   Once you are discharged, your primary care physician will handle any further medical issues.  You Must read complete instructions/literature along with all the possible adverse reactions/side effects for all the Medicines you take and that have been prescribed to you. Take any new Medicines after you have completely understood and accept all the possible adverse reactions/side effects.  If you have smoked or chewed Tobacco in the last 2 yrs please STOP smoking If you drink alcohol, please safely reduce the use. Do not drive, operating heavy machinery, perform activities at heights, swimming or participation in water activities or provide baby sitting services under influence.  Wear Seat belts while driving.   Increase activity slowly   Complete by: As directed       The  results of significant diagnostics from this hospitalization (including imaging, microbiology, ancillary and laboratory) are listed below for reference.    Significant Diagnostic Studies: DG Chest 2 View  Result Date: 03/18/2020 CLINICAL DATA:  New onset atrial fibrillation. Altered mental status. EXAM: CHEST - 2 VIEW COMPARISON:  Chest radiograph 03/06/2020 FINDINGS: Post median sternotomy. Stable cardiomegaly. Mediastinal contours are unchanged. Vascular congestion similar to slightly improved from prior. No pulmonary edema. No focal airspace disease. No pleural effusion. No pneumothorax. No acute osseous abnormalities are seen. IMPRESSION: Stable cardiomegaly. Vascular congestion similar to slightly improved from prior exam. Electronically Signed   By: Keith Rake M.D.   On: 03/18/2020  19:52   CT HEAD WO CONTRAST  Result Date: 03/18/2020 CLINICAL DATA:  Syncope, confusion, history of altered level of consciousness EXAM: CT HEAD WITHOUT CONTRAST TECHNIQUE: Contiguous axial images were obtained from the base of the skull through the vertex without intravenous contrast. COMPARISON:  07/12/2016 FINDINGS: Brain: Chronic ischemic changes are seen within the left corona radiata. Chronic lacunar infarct within the left thalamus. Encephalomalacia left cerebellar hemisphere. No signs of acute infarct or hemorrhage. Lateral ventricles and midline structures are unremarkable. No acute extra-axial fluid collections. No mass effect. Vascular: No hyperdense vessel or unexpected calcification. Skull: Normal. Negative for fracture or focal lesion. Sinuses/Orbits: Mild mucoperiosteal thickening right maxillary sinus. Remaining sinuses are clear. Other: None IMPRESSION: 1. Multifocal chronic ischemic changes as above. 2. No acute intracranial process. Electronically Signed   By: Randa Ngo M.D.   On: 03/18/2020 21:12   US Carotid Bilateral  Result Date: 03/19/2020 CLINICAL DATA:  74 year old male with a  history of syncope EXAM: BILATERAL CAROTID DUPLEX ULTRASOUND TECHNIQUE: Pearline Cables scale imaging, color Doppler and duplex ultrasound were performed of bilateral carotid and vertebral arteries in the neck. COMPARISON:  06/29/2016 FINDINGS: Criteria: Quantification of carotid stenosis is based on velocity parameters that correlate the residual internal carotid diameter with NASCET-based stenosis levels, using the diameter of the distal internal carotid lumen as the denominator for stenosis measurement. The following velocity measurements were obtained: RIGHT ICA:  Systolic 79 cm/sec, Diastolic 25 cm/sec CCA:  78 cm/sec SYSTOLIC ICA/CCA RATIO:  1.0 ECA:  77 cm/sec LEFT ICA:  Systolic 74 cm/sec, Diastolic 17 cm/sec CCA:  86 cm/sec SYSTOLIC ICA/CCA RATIO:  1.2 ECA:  2 94 cm/sec Right Brachial SBP: Not acquired Left Brachial SBP: Not acquired RIGHT CAROTID ARTERY: No significant calcifications of the right common carotid artery. Intermediate waveform maintained. Heterogeneous and partially calcified plaque at the right carotid bifurcation. No significant lumen shadowing. Low resistance waveform of the right ICA. No significant tortuosity. RIGHT VERTEBRAL ARTERY: Antegrade flow with high resistance waveform. LEFT CAROTID ARTERY: No significant calcifications of the left common carotid artery. Intermediate waveform maintained. Heterogeneous and partially calcified plaque at the left carotid bifurcation without significant lumen shadowing. Low resistance waveform of the left ICA. No significant tortuosity. LEFT VERTEBRAL ARTERY: Antegrade flow with high resistance waveform. IMPRESSION: Color duplex indicates minimal heterogeneous and calcified plaque, with no hemodynamically significant stenosis by duplex criteria in the extracranial cerebrovascular circulation. High resistance waveform of the bilateral vertebral arteries, indicating distal stenoses, potentially of either bilateral vertebral arteries or the basilar artery. Given  the patient's symptoms of syncope, further evaluation of the vasculature may be considered with either CT angiogram or formal cerebral angiogram. Signed, Dulcy Fanny. Dellia Nims, RPVI [facility] Electronically Signed   By: Corrie Mckusick D.O.   On: 03/19/2020 09:16   DG Chest Portable 1 View  Result Date: 03/06/2020 CLINICAL DATA:  Left-sided pain. Altered mental status. Hypoglycemia. EXAM: PORTABLE CHEST 1 VIEW COMPARISON:  Chest radiograph 02/05/2016 FINDINGS: Post CABG and median sternotomy. Heart is enlarged. There is vascular congestion. No focal airspace disease. No large pleural effusion. No pneumothorax. No evidence of acute osseous abnormality. IMPRESSION: Cardiomegaly post CABG.  Vascular congestion. Electronically Signed   By: Keith Rake M.D.   On: 03/06/2020 14:16   DG Abd Portable 2 Views  Result Date: 03/06/2020 CLINICAL DATA:  Left-sided pain. Hypoglycemia. Altered mental status. EXAM: PORTABLE ABDOMEN - 2 VIEW COMPARISON:  None. FINDINGS: No free intra-abdominal air. Air-fluid level noted  in the stomach. No gaseous small bowel distension. Minimal air within the ascending and sigmoid colon, small volume of stool distally. There is otherwise a paucity of bowel gas. No definite radiopaque calculi. Pelvic calcifications are most consistent with phleboliths. No acute osseous abnormalities are seen. Habitus limits evaluation. IMPRESSION: Nonspecific bowel gas pattern with relative paucity of bowel gas. No free air or evidence of obstruction. Nonspecific air-fluid level in the stomach. Electronically Signed   By: Keith Rake M.D.   On: 03/06/2020 14:18   ECHOCARDIOGRAM COMPLETE  Result Date: 03/19/2020    ECHOCARDIOGRAM REPORT   Patient Name:   KENNIETH WINIARSKI Date of Exam: 03/19/2020 Medical Rec #:  [id number]             Height:       72.0 in Accession #:    [id number]            Weight:       200.0 lb Date of Birth:   [birth year]             BSA:          2.131 m Patient Age:    74 years              BP:           163/81 mmHg Patient Gender: M                     HR:           78 bpm. Exam Location:  [facility] Procedure: 2D Echo, Cardiac Doppler and Color Doppler Indications:     Syncope 780.2  History:         Patient has prior history of Echocardiogram examinations, most                  recent 06/28/2016. Stroke; Risk Factors:Hypertension and                  Diabetes.  Sonographer:     Sherrie Sport RDCS (AE) Referring Phys:  [id number] Athena Masse Diagnosing Phys: Serafina Royals MD  Sonographer Comments: Suboptimal apical window. IMPRESSIONS  1. Left ventricular ejection fraction, by estimation, is 55 to 60%. The left ventricle has normal function. The left ventricle has no regional wall motion abnormalities. Left ventricular diastolic parameters were normal.  2. Right ventricular systolic function is normal. The right ventricular size is mildly enlarged. There is normal pulmonary artery systolic pressure.  3. Left atrial size was mildly dilated.  4. Right atrial size was mildly dilated.  5. The mitral valve is normal in structure. Mild to moderate mitral valve regurgitation.  6. Tricuspid valve regurgitation is mild to moderate.  7. The aortic valve is normal in structure. Aortic valve regurgitation is not visualized. FINDINGS  Left Ventricle: Left ventricular ejection fraction, by estimation, is 55 to 60%. The left ventricle has normal function. The left ventricle has no regional wall motion abnormalities. The left ventricular internal cavity size was normal in size. There is  no left ventricular hypertrophy. Left ventricular diastolic parameters were normal. Right Ventricle: The right ventricular size is mildly enlarged. No increase in right ventricular wall thickness. Right ventricular systolic function is normal. There is normal pulmonary artery systolic pressure. The tricuspid regurgitant velocity is 1.51  m/s, and with an  assumed right atrial pressure of 10 mmHg, the estimated right ventricular systolic pressure is [id number] mmHg. Left Atrium: Left atrial size was mildly dilated. Right Atrium:  Right atrial size was mildly dilated. Pericardium: There is no evidence of pericardial effusion. Mitral Valve: The mitral valve is normal in structure. Mild to moderate mitral valve regurgitation. Tricuspid Valve: The tricuspid valve is normal in structure. Tricuspid valve regurgitation is mild to moderate. Aortic Valve: The aortic valve is normal in structure. Aortic valve regurgitation is not visualized. Aortic valve mean gradient measures 3.5 mmHg. Aortic valve peak gradient measures 6.0 mmHg. Aortic valve area, by VTI measures 2.53 cm. Pulmonic Valve: The pulmonic valve was normal in structure. Pulmonic valve regurgitation is not visualized. Aorta: The aortic root and ascending aorta are structurally normal, with no evidence of dilitation. IAS/Shunts: No atrial level shunt detected by color flow Doppler.  LEFT VENTRICLE PLAX 2D LVIDd:         3.88 cm  Diastology LVIDs:         2.61 cm  LV e' lateral:   11.10 cm/s LV PW:         1.32 cm  LV E/e' lateral: 10.9 LV IVS:        1.86 cm  LV e' medial:    7.07 cm/s LVOT diam:     2.20 cm  LV E/e' medial:  17.1 LV SV:         60 LV SV Index:   28 LVOT Area:     3.80 cm  RIGHT VENTRICLE RV Basal diam:  4.46 cm RV S prime:     9.57 cm/s TAPSE (M-mode): 2.4 cm LEFT ATRIUM            Index       RIGHT ATRIUM           Index LA diam:      4.20 cm  1.97 cm/m  RA Area:     22.70 cm LA Vol (A2C): 70.2 ml  32.95 ml/m RA Volume:   63.00 ml  29.57 ml/m LA Vol (A4C): 116.0 ml 54.45 ml/m  AORTIC VALVE                   PULMONIC VALVE AV Area (Vmax):    2.75 cm    PV Vmax:        0.69 m/s AV Area (Vmean):   2.65 cm    PV Peak grad:   1.9 mmHg AV Area (VTI):     2.53 cm    RVOT Peak grad: 4 mmHg AV Vmax:           122.50 cm/s AV Vmean:          87.150 cm/s AV VTI:            0.239 m AV Peak Grad:      6.0  mmHg AV Mean Grad:      3.5 mmHg LVOT Vmax:         88.70 cm/s LVOT Vmean:        60.800 cm/s LVOT VTI:          0.159 m LVOT/AV VTI ratio: 0.67  AORTA Ao Root diam: 3.20 cm MITRAL VALVE                TRICUSPID VALVE MV Area (PHT): 5.13 cm     TR Peak grad:   9.1 mmHg MV Decel Time: 148 msec     TR Vmax:        151.00 cm/s MV E velocity: 121.00 cm/s MV A velocity: 60.10 cm/s   SHUNTS MV E/A ratio:  2.01  Systemic VTI:  0.16 m                             Systemic Diam: 2.20 cm Serafina Royals MD Electronically signed by Serafina Royals MD Signature Date/Time: 03/19/2020/12:32:59 PM    Final     Microbiology: Recent Results (from the past 240 hour(s))  SARS CORONAVIRUS 2 (TAT 6-24 HRS) Nasopharyngeal Nasopharyngeal Swab     Status: None   Collection Time: 03/18/20  8:18 PM   Specimen: Nasopharyngeal Swab  Result Value Ref Range Status   SARS Coronavirus 2 NEGATIVE NEGATIVE Final    Comment: (NOTE) SARS-CoV-2 target nucleic acids are NOT DETECTED. The SARS-CoV-2 RNA is generally detectable in upper and lower respiratory specimens during the acute phase of infection. Negative results do not preclude SARS-CoV-2 infection, do not rule out co-infections with other pathogens, and should not be used as the sole basis for treatment or other patient management decisions. Negative results must be combined with clinical observations, patient history, and epidemiological information. The expected result is Negative. Fact Sheet for Patients: SugarRoll.be Fact Sheet for Healthcare Providers: https://www.woods-mathews.com/ This test is not yet approved or cleared by the [place] FDA and  has been authorized for detection and/or diagnosis of SARS-CoV-2 by FDA under an Emergency Use Authorization (EUA). This EUA will remain  in effect (meaning this test can be used) for the duration of the COVID-19 declaration under Section 56 4(b)(1) of the Act, 21  U.S.C. section 360bbb-3(b)(1), unless the authorization is terminated or revoked sooner. Performed at [facility], [place] [address], [place], [place] [address]      Labs: CBC: Recent Labs  Lab 03/18/20 1638 03/19/20 0550  WBC 13.7* 10.0  HGB 13.2 11.6*  HCT 41.8 36.0*  MCV 92.1 92.1  PLT 241 [id number]   Basic Metabolic Panel: Recent Labs  Lab 03/18/20 1638 03/19/20 0550  NA 140 138  K 4.9 4.6  CL 105 106  CO2 26 25  GLUCOSE 70 240*  BUN 20 20  CREATININE 1.34* 1.23  CALCIUM 9.4 8.6*   Liver Function Tests: No results for input(s): AST, ALT, ALKPHOS, BILITOT, PROT, ALBUMIN in the last 168 hours. No results for input(s): LIPASE, AMYLASE in the last 168 hours. No results for input(s): AMMONIA in the last 168 hours. Cardiac Enzymes: No results for input(s): CKTOTAL, CKMB, CKMBINDEX, TROPONINI in the last 168 hours. BNP (last 3 results) Recent Labs    03/18/20 1956  BNP 155.0*   CBG: Recent Labs  Lab 03/19/20 1648 03/19/20 2101 03/20/20 0456 03/20/20 0757 03/20/20 1153  GLUCAP 223* 270* 250* 229* 272*    Time spent: 35 minutes  Signed:  Berle Mull  [facility] 03/20/2020  7:38 PM

## 2020-09-16 ENCOUNTER — Other Ambulatory Visit
Admission: RE | Admit: 2020-09-16 | Discharge: 2020-09-16 | Disposition: A | Discharge status: Home / Self Care | Payer: Medicare Other | Source: Ambulatory Visit | Attending: Internal Medicine | Admitting: Internal Medicine

## 2020-09-16 ENCOUNTER — Other Ambulatory Visit: Payer: Self-pay

## 2020-09-16 DIAGNOSIS — Z01812 Encounter for preprocedural laboratory examination: Secondary | ICD-10-CM | POA: Insufficient documentation

## 2020-09-16 DIAGNOSIS — Z20822 Contact with and (suspected) exposure to covid-19: Secondary | ICD-10-CM | POA: Insufficient documentation

## 2020-09-16 NOTE — Pre-Procedure Instructions (Signed)
Patient came for covid testing today and stated his procedure was on 09/18/20.  He was tested d/t this information but it is now realized that his procedure is not until 09/25/20.  He will need to be retested 2 days prior to his procedure.

## 2020-09-17 LAB — SARS CORONAVIRUS 2 (TAT 6-24 HRS): SARS Coronavirus 2: NEGATIVE

## 2020-09-23 ENCOUNTER — Other Ambulatory Visit: Payer: Self-pay

## 2020-09-23 ENCOUNTER — Other Ambulatory Visit
Admission: RE | Admit: 2020-09-23 | Discharge: 2020-09-23 | Disposition: A | Discharge status: Home / Self Care | Payer: Medicare Other | Source: Ambulatory Visit | Attending: Internal Medicine | Admitting: Internal Medicine

## 2020-09-23 DIAGNOSIS — Z01812 Encounter for preprocedural laboratory examination: Secondary | ICD-10-CM | POA: Insufficient documentation

## 2020-09-23 DIAGNOSIS — Z20822 Contact with and (suspected) exposure to covid-19: Secondary | ICD-10-CM | POA: Insufficient documentation

## 2020-09-23 LAB — SARS CORONAVIRUS 2 (TAT 6-24 HRS): SARS Coronavirus 2: NEGATIVE

## 2020-09-25 ENCOUNTER — Other Ambulatory Visit: Payer: Self-pay

## 2020-09-25 ENCOUNTER — Ambulatory Visit: Payer: Medicare Other | Admitting: Anesthesiology

## 2020-09-25 ENCOUNTER — Encounter
Admission: RE | Disposition: A | Discharge status: Home / Self Care | Payer: Self-pay | Source: Home / Self Care | Attending: Internal Medicine

## 2020-09-25 ENCOUNTER — Encounter: Payer: Self-pay | Admitting: Internal Medicine

## 2020-09-25 ENCOUNTER — Ambulatory Visit
Admission: RE | Admit: 2020-09-25 | Discharge: 2020-09-25 | Disposition: A | Discharge status: Home / Self Care | Payer: Medicare Other | Attending: Internal Medicine | Admitting: Internal Medicine

## 2020-09-25 DIAGNOSIS — I2510 Atherosclerotic heart disease of native coronary artery without angina pectoris: Secondary | ICD-10-CM | POA: Insufficient documentation

## 2020-09-25 DIAGNOSIS — Z7901 Long term (current) use of anticoagulants: Secondary | ICD-10-CM | POA: Insufficient documentation

## 2020-09-25 DIAGNOSIS — Z951 Presence of aortocoronary bypass graft: Secondary | ICD-10-CM | POA: Diagnosis not present

## 2020-09-25 DIAGNOSIS — Z8673 Personal history of transient ischemic attack (TIA), and cerebral infarction without residual deficits: Secondary | ICD-10-CM | POA: Diagnosis not present

## 2020-09-25 DIAGNOSIS — Z720 Tobacco use: Secondary | ICD-10-CM | POA: Insufficient documentation

## 2020-09-25 DIAGNOSIS — I480 Paroxysmal atrial fibrillation: Secondary | ICD-10-CM | POA: Diagnosis not present

## 2020-09-25 DIAGNOSIS — I6509 Occlusion and stenosis of unspecified vertebral artery: Secondary | ICD-10-CM | POA: Diagnosis not present

## 2020-09-25 DIAGNOSIS — E1151 Type 2 diabetes mellitus with diabetic peripheral angiopathy without gangrene: Secondary | ICD-10-CM | POA: Insufficient documentation

## 2020-09-25 HISTORY — PX: CARDIOVERSION: SHX1299

## 2020-09-25 HISTORY — DX: Restless legs syndrome: G25.81

## 2020-09-25 HISTORY — DX: Type 2 diabetes mellitus with foot ulcer: E11.621

## 2020-09-25 HISTORY — DX: Unspecified atrial fibrillation: I48.91

## 2020-09-25 LAB — GLUCOSE, CAPILLARY: Glucose-Capillary: 104 mg/dL — ABNORMAL HIGH (ref 70–99)

## 2020-09-25 SURGERY — CARDIOVERSION
Anesthesia: General

## 2020-09-25 MED ORDER — SODIUM CHLORIDE 0.9 % IV SOLN: INTRAVENOUS | Status: DC | PRN

## 2020-09-25 MED ORDER — PROPOFOL 10 MG/ML IV BOLUS
INTRAVENOUS | Status: AC
  Filled 2020-09-25: qty 20

## 2020-09-25 MED ORDER — SODIUM CHLORIDE FLUSH 0.9 % IV SOLN
INTRAVENOUS | Status: AC
  Filled 2020-09-25: qty 20

## 2020-09-25 MED ORDER — PROPOFOL 10 MG/ML IV BOLUS
INTRAVENOUS | Status: DC | PRN
  Administered 2020-09-25: 60 mg via INTRAVENOUS
  Administered 2020-09-25: 10 mg via INTRAVENOUS

## 2020-09-25 NOTE — Anesthesia Procedure Notes (Signed)
Date/Time: 09/25/2020 8:31 AM Performed by: Doreen Salvage, CRNA Pre-anesthesia Checklist: Patient identified, Emergency Drugs available, Suction available and Patient being monitored Patient Re-evaluated:Patient Re-evaluated prior to induction Oxygen Delivery Method: Nasal cannula Induction Type: IV induction Dental Injury: Teeth and Oropharynx as per pre-operative assessment  Comments: Nasal cannula with etCO2 monitoring

## 2020-09-25 NOTE — Transfer of Care (Signed)
Immediate Anesthesia Transfer of Care Note  Patient: [name]  Procedure(s) Performed: Procedure(s): CARDIOVERSION (N/A)  Patient Location: PACU and Short Stay  Anesthesia Type:General  Level of Consciousness: awake, alert  and oriented  Airway & Oxygen Therapy: Patient Spontanous Breathing and Patient connected to nasal cannula oxygen  Post-op Assessment: Report given to RN and Post -op Vital signs reviewed and stable  Post vital signs: Reviewed and stable  Last Vitals:  Vitals:   09/25/20 0838 09/25/20 0839  BP:  (!) 161/86  Pulse: 71 71  Resp: (!) 21 20  Temp:    SpO2: 53% 74%    Complications: No apparent anesthesia complications

## 2020-09-25 NOTE — Anesthesia Preprocedure Evaluation (Addendum)
Anesthesia Evaluation  Patient identified by MRN, date of birth, ID band Patient awake    Reviewed: Allergy & Precautions, H&P , NPO status , Patient's Chart, lab work & pertinent test results  History of Anesthesia Complications Negative for: history of anesthetic complications  Airway Mallampati: III  TM Distance: >3 FB     Dental  (+) Missing, Chipped   Pulmonary sleep apnea (does not use CPAP) , neg COPD,    breath sounds clear to auscultation       Cardiovascular hypertension, (-) angina+ CABG (pt reports open heart surgery but he is not sure what for)  (-) Past MI and (-) Cardiac Stents + dysrhythmias Atrial Fibrillation  Rhythm:irregular Rate:Normal     Neuro/Psych PSYCHIATRIC DISORDERS Anxiety Depression CVA    GI/Hepatic negative GI ROS, Neg liver ROS,   Endo/Other  diabetes  Renal/GU   negative genitourinary   Musculoskeletal   Abdominal   Peds  Hematology negative hematology ROS (+)   Anesthesia Other Findings Past Medical History: No date: Atrial fibrillation (HCC) No date: BPH (benign prostatic hyperplasia) No date: Depression No date: Diabetes mellitus without complication (HCC) No date: Diabetic foot ulcers (HCC) No date: HLD (hyperlipidemia) No date: Hypertension No date: Neuropathy No date: PTSD (post-traumatic stress disorder) No date: Restless leg syndrome No date: Stroke [facility])     Comment:  Stoke in 2017 and mulitiple TIAs since per daughter  Past Surgical History: No date: BYPASS GRAFT 10/28/2016: CARDIAC CATHETERIZATION; Left     Comment:  Procedure: Left Heart Cath and Coronary Angiography;                Surgeon: Corey Skains, MD;  Location: [place]               CV LAB;  Service: Cardiovascular;  Laterality: Left; No date: SHOULDER ARTHROSCOPY No date: UVULOPALATOPHARYNGOPLASTY, TONSILLECTOMY AND SEPTOPLASTY  BMI    Body Mass Index: 34.45 kg/m       Reproductive/Obstetrics negative OB ROS                            Anesthesia Physical Anesthesia Plan  ASA: III  Anesthesia Plan: General   Post-op Pain Management:    Induction:   PONV Risk Score and Plan:   Airway Management Planned:   Additional Equipment:   Intra-op Plan:   Post-operative Plan:   Informed Consent: I have reviewed the patients History and Physical, chart, labs and discussed the procedure including the risks, benefits and alternatives for the proposed anesthesia with the patient or authorized representative who has indicated his/her understanding and acceptance.     Dental Advisory Given  Plan Discussed with: Anesthesiologist, CRNA and Surgeon  Anesthesia Plan Comments:         Anesthesia Quick Evaluation

## 2020-09-25 NOTE — CV Procedure (Signed)
Electrical Cardioversion Procedure Note [name] [id number] 06/20/1946  Procedure: Electrical Cardioversion Indications:  Paroxysmal non valvular atrial fibrillation  Procedure Details Consent: Risks of procedure as well as the alternatives and risks of each were explained to the (patient/caregiver).  Consent for procedure obtained. Time Out: Verified patient identification, verified procedure, site/side was marked, verified correct patient position, special equipment/implants available, medications/allergies/relevent history reviewed, required imaging and test results available.  Performed  Patient placed on cardiac monitor, pulse oximetry, supplemental oxygen as necessary.  Sedation given: Propofol and versed as per anesthesia  Pacer pads placed anterior and posterior chest.  Cardioverted 1 time(s).  Cardioverted at 120J.  Evaluation Findings: Post procedure EKG shows: NSR Complications: None Patient did tolerate procedure well.   [name] M.D. [facility] 09/25/2020, 8:39 AM

## 2020-09-27 NOTE — Anesthesia Postprocedure Evaluation (Signed)
Anesthesia Post Note  Patient: Braulio Kiedrowski Pontarelli  Procedure(s) Performed: CARDIOVERSION (N/A )  Patient location during evaluation: PACU Anesthesia Type: General Level of consciousness: awake and alert Pain management: pain level controlled Vital Signs Assessment: post-procedure vital signs reviewed and stable Respiratory status: spontaneous breathing, nonlabored ventilation and respiratory function stable Cardiovascular status: blood pressure returned to baseline and stable Postop Assessment: no apparent nausea or vomiting Anesthetic complications: no   No complications documented.   Last Vitals:  Vitals:   09/25/20 0900 09/25/20 0915  BP: 121/67   Pulse: 69 72  Resp: 20   Temp:    SpO2: 91% 95%    Last Pain:  Vitals:   09/25/20 0900  TempSrc:   PainSc: 0-No pain                 Brett Canales Aldrin Engelhard

## 2020-12-19 ENCOUNTER — Other Ambulatory Visit (INDEPENDENT_AMBULATORY_CARE_PROVIDER_SITE_OTHER): Payer: Self-pay | Admitting: Vascular Surgery

## 2020-12-19 DIAGNOSIS — L97529 Non-pressure chronic ulcer of other part of left foot with unspecified severity: Secondary | ICD-10-CM

## 2020-12-30 ENCOUNTER — Ambulatory Visit (INDEPENDENT_AMBULATORY_CARE_PROVIDER_SITE_OTHER): Payer: Medicare Other | Admitting: Vascular Surgery

## 2020-12-30 ENCOUNTER — Ambulatory Visit (INDEPENDENT_AMBULATORY_CARE_PROVIDER_SITE_OTHER): Payer: Medicare Other

## 2020-12-30 ENCOUNTER — Other Ambulatory Visit: Payer: Self-pay

## 2020-12-30 ENCOUNTER — Encounter (INDEPENDENT_AMBULATORY_CARE_PROVIDER_SITE_OTHER): Payer: Self-pay | Admitting: Vascular Surgery

## 2020-12-30 VITALS — BP 163/76 | HR 76 | Ht 72.0 in | Wt 245.0 lb

## 2020-12-30 DIAGNOSIS — I10 Essential (primary) hypertension: Secondary | ICD-10-CM | POA: Diagnosis not present

## 2020-12-30 DIAGNOSIS — E782 Mixed hyperlipidemia: Secondary | ICD-10-CM

## 2020-12-30 DIAGNOSIS — L97529 Non-pressure chronic ulcer of other part of left foot with unspecified severity: Secondary | ICD-10-CM | POA: Diagnosis not present

## 2020-12-30 DIAGNOSIS — E1159 Type 2 diabetes mellitus with other circulatory complications: Secondary | ICD-10-CM

## 2020-12-30 DIAGNOSIS — I25810 Atherosclerosis of coronary artery bypass graft(s) without angina pectoris: Secondary | ICD-10-CM | POA: Diagnosis not present

## 2020-12-30 DIAGNOSIS — I7025 Atherosclerosis of native arteries of other extremities with ulceration: Secondary | ICD-10-CM | POA: Diagnosis not present

## 2021-01-01 ENCOUNTER — Encounter (INDEPENDENT_AMBULATORY_CARE_PROVIDER_SITE_OTHER): Payer: Self-pay | Admitting: Vascular Surgery

## 2021-01-01 DIAGNOSIS — I7025 Atherosclerosis of native arteries of other extremities with ulceration: Secondary | ICD-10-CM | POA: Insufficient documentation

## 2021-01-01 NOTE — Progress Notes (Signed)
MRN : [id number]  [name] is a 75 y.o. (03/30/1946) male who presents with chief complaint of  Chief Complaint  Patient presents with  . New Patient (Initial Visit)    Ulcer on Lt foot  .  History of Present Illness:   Patient seen at the request of Dr. Excell SeltzerBaker for evaluation of peripheral arterial disease in association with ulceration of the left foot.  The patient notes the ulcer has been present for multiple weeks but states that it has been improving.  It is very painful and has had some drainage.  No specific history of trauma noted by the patient.  The patient denies fever or chills.  the patient does have diabetes which has been difficult to control.  Patient notes prior to the ulcer developing the extremities were painful particularly with ambulation or activity and the discomfort is very consistent day today. Typically, the pain occurs at less than one block, progress is as activity continues to the point that the patient must stop walking. Resting including standing still for several minutes allowed resumption of the activity and the ability to walk a similar distance before stopping again. Uneven terrain and inclined shorten the distance. The pain has been progressive over the past several years.   The patient denies rest pain or dangling of an extremity off the side of the bed during the night for relief. No prior interventions or surgeries.  No history of back problems or DJD of the lumbar sacral spine.   The patient denies amaurosis fugax or recent TIA symptoms. There are no recent neurological changes noted. The patient denies history of DVT, PE or superficial thrombophlebitis. The patient denies recent episodes of angina or shortness of breath.  ABIs obtained today demonstrate right 0.77 with monophasic signals at the ankle left is 1.23 with triphasic posterior tibial artery signals.  Current Meds  Medication Sig  . apixaban (ELIQUIS) 5 MG TABS tablet  Take 1 tablet (5 mg total) by mouth 2 (two) times daily.  Marland Kitchen. aspirin EC 81 MG tablet Take 81 mg by mouth daily. Swallow whole.  . calcium carbonate (TUMS - DOSED IN MG ELEMENTAL CALCIUM) 500 MG chewable tablet Chew 1 tablet by mouth daily as needed for indigestion or heartburn.  . cholecalciferol (VITAMIN D) 1000 UNITS tablet Take 1,000 Units by mouth daily.  Marland Kitchen. docusate sodium (COLACE) 100 MG capsule Take 100 mg by mouth daily.  . Dulaglutide (TRULICITY) 1.5 MG/0.5ML SOPN Inject 1.5 mg into the skin every Sunday.   . finasteride (PROSCAR) 5 MG tablet Take 5 mg by mouth daily.  . furosemide (LASIX) 80 MG tablet Take 80 mg by mouth daily.  Marland Kitchen. gabapentin (NEURONTIN) 100 MG capsule Take 100 mg by mouth 3 (three) times daily.  Marland Kitchen. icosapent Ethyl (VASCEPA) 1 g capsule Take 2 g by mouth 2 (two) times daily.   . insulin aspart (NOVOLOG) 100 UNIT/ML injection Inject 20 Units into the skin 3 (three) times daily before meals. (Patient taking differently: Inject 20-25 Units into the skin See admin instructions. Inject 20 units before breakfast, 20 units before lunch, and 25 units before supper)  . insulin glargine (LANTUS) 100 UNIT/ML injection Inject 1 mL (100 Units total) into the skin at bedtime. (Patient taking differently: Inject 30 Units into the skin at bedtime.)  . lisinopril (ZESTRIL) 40 MG tablet Take 40 mg by mouth daily.   . metFORMIN (GLUCOPHAGE) 1000 MG tablet Take 1,000 mg by mouth 2 (two) times daily with  a meal.   . metoprolol tartrate (LOPRESSOR) 25 MG tablet Take 25 mg by mouth 2 (two) times daily.  . nortriptyline (PAMELOR) 10 MG capsule Take 30 mg by mouth at bedtime.   Marland Kitchen PARoxetine (PAXIL) 40 MG tablet Take 60 mg by mouth daily.   Marland Kitchen rOPINIRole (REQUIP) 0.5 MG tablet Take 0.5 mg by mouth at bedtime.  . simvastatin (ZOCOR) 80 MG tablet Take 40 mg by mouth at bedtime.   . tamsulosin (FLOMAX) 0.4 MG CAPS capsule Take 0.4 mg by mouth daily after lunch.   . traMADol-acetaminophen (ULTRACET)  37.5-325 MG tablet Take 1 tablet by mouth every 8 (eight) hours as needed for moderate pain.  . vitamin B-12 (CYANOCOBALAMIN) 1000 MCG tablet Take 1,000 mcg by mouth at bedtime.    Past Medical History:  Diagnosis Date  . Atrial fibrillation ([place])   . BPH (benign prostatic hyperplasia)   . Depression   . Diabetes mellitus without complication ([place])   . Diabetic foot ulcers ([place])   . HLD (hyperlipidemia)   . Hypertension   . Neuropathy   . PTSD (post-traumatic stress disorder)   . Restless leg syndrome   . Stroke [facility])    Stoke in 2017 and mulitiple TIAs since per daughter    Past Surgical History:  Procedure Laterality Date  . BYPASS GRAFT    . CARDIAC CATHETERIZATION Left 10/28/2016   Procedure: Left Heart Cath and Coronary Angiography;  Surgeon: Corey Skains, MD;  Location: [place] CV LAB;  Service: Cardiovascular;  Laterality: Left;  . CARDIOVERSION N/A 09/25/2020   Procedure: CARDIOVERSION;  Surgeon: Corey Skains, MD;  Location: [facility] ORS;  Service: Cardiovascular;  Laterality: N/A;  . SHOULDER ARTHROSCOPY    . UVULOPALATOPHARYNGOPLASTY, TONSILLECTOMY AND SEPTOPLASTY      Social History Social History   Tobacco Use  . Smoking status: Never Smoker  . Smokeless tobacco: Never Used  Vaping Use  . Vaping Use: Never used  Substance Use Topics  . Alcohol use: No    Alcohol/week: 0.0 standard drinks  . Drug use: No    Family History Family History  Problem Relation Age of Onset  . Hypertension Other   . Diabetes Mellitus II Other   . Diabetes Mother   . Diabetes Sister   . Atrial fibrillation Sister   . Atrial fibrillation Father   . Atrial fibrillation Daughter   No family history of bleeding/clotting disorders, porphyria or autoimmune disease   No Known Allergies   REVIEW OF SYSTEMS (Negative unless checked)  Constitutional: [] Weight loss  [] Fever  [] Chills Cardiac: [] Chest pain   [] Chest pressure   [] Palpitations   [] Shortness of breath when  laying flat   [] Shortness of breath with exertion. Vascular:  [x] Pain in legs with walking   [] Pain in legs at rest  [] History of DVT   [] Phlebitis   [] Swelling in legs   [] Varicose veins   [x] Healing ulcers Pulmonary:   [] Uses home oxygen   [] Productive cough   [] Hemoptysis   [] Wheeze  [] COPD   [] Asthma Neurologic:  [] Dizziness   [] Seizures   [] History of stroke   [] History of TIA  [] Aphasia   [] Vissual changes   [] Weakness or numbness in arm   [x] Weakness or numbness in leg Musculoskeletal:   [] Joint swelling   [x] Joint pain   [] Low back pain Hematologic:  [] Easy bruising  [] Easy bleeding   [] Hypercoagulable state   [] Anemic Gastrointestinal:  [] Diarrhea   [] Vomiting  [] Gastroesophageal reflux/heartburn   [] Difficulty swallowing. Genitourinary:  [  x]Chronic kidney disease   [] Difficult urination  [] Frequent urination   [] Blood in urine Skin:  [] Rashes   [] Ulcers  Psychological:  [] History of anxiety   []  History of major depression.  Physical Examination  Vitals:   12/30/20 1508  BP: (!) 163/76  Pulse: 76  Weight: 245 lb (111.1 kg)  Height: 6' (1.829 m)   Body mass index is 33.23 kg/m. Gen: WD/WN, NAD Head: /AT, No temporalis wasting.  Ear/Nose/Throat: Hearing grossly intact, nares w/o erythema or drainage, poor dentition Eyes: PER, EOMI, sclera nonicteric.  Neck: Supple, no masses.  No bruit or JVD.  Pulmonary:  Good air movement, clear to auscultation bilaterally, no use of accessory muscles.  Cardiac: RRR, normal S1, S2, no Murmurs. Vascular: Superficial ulcer left foot noninfected Vessel Right Left  Radial Palpable Palpable  PT Not Palpable Trace Palpable  DP Not Palpable Not Palpable  Gastrointestinal: soft, non-distended. No guarding/no peritoneal signs.  Musculoskeletal: M/S 5/5 throughout.  No deformity or atrophy.  Neurologic: CN 2-12 intact. Pain and light touch intact in extremities.  Symmetrical.  Speech is fluent. Motor exam as listed above. Psychiatric: Judgment  intact, Mood & affect appropriate for pt's clinical situation. Dermatologic: No rashes + left ulcer noted.  No changes consistent with cellulitis.  CBC Lab Results  Component Value Date   WBC 10.0 03/19/2020   HGB 11.6 (L) 03/19/2020   HCT 36.0 (L) 03/19/2020   MCV 92.1 03/19/2020   PLT 190 03/19/2020    BMET    Component Value Date/Time   NA 138 03/19/2020 0550   K 4.6 03/19/2020 0550   CL 106 03/19/2020 0550   CO2 25 03/19/2020 0550   GLUCOSE 240 (H) 03/19/2020 0550   BUN 20 03/19/2020 0550   CREATININE 1.23 03/19/2020 0550   CALCIUM 8.6 (L) 03/19/2020 0550   GFRNONAA 57 (L) 03/19/2020 0550   GFRAA >60 03/19/2020 0550   CrCl cannot be calculated (Patient's most recent lab result is older than the maximum 21 days allowed.).  COAG No results found for: INR, PROTIME  Radiology VAS [place] ABI WITH/WO TBI  Result Date: 12/30/2020 LOWER EXTREMITY DOPPLER STUDY Indications: Ulceration.  Performing Technologist: Almira Coaster RVS  Examination Guidelines: A complete evaluation includes at minimum, Doppler waveform signals and systolic blood pressure reading at the level of bilateral brachial, anterior tibial, and posterior tibial arteries, when vessel segments are accessible. Bilateral testing is considered an integral part of a complete examination. Photoelectric Plethysmograph (PPG) waveforms and toe systolic pressure readings are included as required and additional duplex testing as needed. Limited examinations for reoccurring indications may be performed as noted.  ABI Findings: +---------+------------------+-----+----------+--------+ Right    Rt Pressure (mmHg)IndexWaveform  Comment  +---------+------------------+-----+----------+--------+ Brachial 175                                       +---------+------------------+-----+----------+--------+ ATA      112               0.64 monophasic         +---------+------------------+-----+----------+--------+ PTA      135                0.77 biphasic           +---------+------------------+-----+----------+--------+ Great Toe97                0.55 Abnormal           +---------+------------------+-----+----------+--------+ +---------+------------------+-----+---------+-------+  Left     Lt Pressure (mmHg)IndexWaveform Comment +---------+------------------+-----+---------+-------+ Brachial 174                                     +---------+------------------+-----+---------+-------+ ATA      184               1.05 biphasic         +---------+------------------+-----+---------+-------+ PTA      216               1.23 triphasic        +---------+------------------+-----+---------+-------+ Great Toe177               1.01                  +---------+------------------+-----+---------+-------+ +-------+-----------+-----------+------------+------------+ ABI/TBIToday's ABIToday's TBIPrevious ABIPrevious TBI +-------+-----------+-----------+------------+------------+ Right  .77        .55                                 +-------+-----------+-----------+------------+------------+ Left   1.23       1.01                                +-------+-----------+-----------+------------+------------+  Summary: Right: Resting right ankle-brachial index indicates moderate right lower extremity arterial disease. The right toe-brachial index is abnormal. Left: Resting left ankle-brachial index is within normal range. No evidence of significant left lower extremity arterial disease. The left toe-brachial index is normal.  *See table(s) above for measurements and observations.  Electronically signed by Levora Dredge MD on 12/30/2020 at 5:18:02 PM.   Final      Assessment/Plan 1. Atherosclerosis of native arteries of the extremities with ulceration (HCC) Recommend:  The patient is currently dealing with ulceration of the left foot.  The patient reports that the claudication symptoms and leg pain are  unchanged and continue to be an issue.   The patient continues to voice lifestyle limiting changes at this point in time.  This is a somewhat difficult situation as the patient's noninvasive studies demonstrate triphasic flow with a normal ABI on the left in the posterior tibial distribution.  This in combination with the sentiment of the patient who feels his wound is healing would suggest that a trial of further observation is warranted.  I will see him back in 6 weeks no studies at that time should his ulcer persist then angiography will be discussed.  In the mean time the patient should continue walking and an exercise program.  The patient should continue antiplatelet therapy and aggressive treatment of the lipid abnormalities     2. 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  Continue statin as ordered and reviewed, no changes at this time  Nitrates PRN for chest pain   3. Primary hypertension Continue antihypertensive medications as already ordered, these medications have been reviewed and there are no changes at this time.   4. Type 2 diabetes mellitus with other circulatory complication, unspecified whether long term insulin use (HCC) Continue hypoglycemic medications as already ordered, these medications have been reviewed and there are no changes at this time.  Hgb A1C to be monitored as already arranged by primary  service   5. Mixed hyperlipidemia Continue statin as ordered and reviewed, no changes at this time    [name], MD  01/01/2021 11:03 AM

## 2021-02-10 ENCOUNTER — Other Ambulatory Visit: Payer: Self-pay

## 2021-02-10 ENCOUNTER — Ambulatory Visit (INDEPENDENT_AMBULATORY_CARE_PROVIDER_SITE_OTHER): Payer: Medicare Other | Admitting: Vascular Surgery

## 2021-02-10 VITALS — BP 162/79 | HR 96 | Ht 72.0 in | Wt 247.0 lb

## 2021-02-10 DIAGNOSIS — I25810 Atherosclerosis of coronary artery bypass graft(s) without angina pectoris: Secondary | ICD-10-CM

## 2021-02-10 DIAGNOSIS — I10 Essential (primary) hypertension: Secondary | ICD-10-CM

## 2021-02-10 DIAGNOSIS — I7025 Atherosclerosis of native arteries of other extremities with ulceration: Secondary | ICD-10-CM

## 2021-02-10 DIAGNOSIS — E782 Mixed hyperlipidemia: Secondary | ICD-10-CM

## 2021-02-10 DIAGNOSIS — E1159 Type 2 diabetes mellitus with other circulatory complications: Secondary | ICD-10-CM | POA: Diagnosis not present

## 2021-02-21 ENCOUNTER — Encounter (INDEPENDENT_AMBULATORY_CARE_PROVIDER_SITE_OTHER): Payer: Self-pay | Admitting: Vascular Surgery

## 2021-02-21 NOTE — Progress Notes (Signed)
MRN : [id number]  [name] is a 75 y.o. (December 15, 1946) male who presents with chief complaint of  Chief Complaint  Patient presents with  . Follow-up    6 wk no studies  .  History of Present Illness:   The patient returns to the office for followup. There have been no interval changes in lower extremity symptoms. No interval shortening of the patient's claudication distance or development of rest pain symptoms. No new ulcers or wounds have occurred since the last visit.  His current ulcer continues to improve.  There have been no significant changes to the patient's overall health care.  The patient denies amaurosis fugax or recent TIA symptoms. There are no recent neurological changes noted. The patient denies history of DVT, PE or superficial thrombophlebitis. The patient denies recent episodes of angina or shortness of breath.     Current Meds  Medication Sig  . apixaban (ELIQUIS) 5 MG TABS tablet Take 1 tablet (5 mg total) by mouth 2 (two) times daily.  Marland Kitchen aspirin EC 81 MG tablet Take 81 mg by mouth daily. Swallow whole.  . calcium carbonate (TUMS - DOSED IN MG ELEMENTAL CALCIUM) 500 MG chewable tablet Chew 1 tablet by mouth daily as needed for indigestion or heartburn.  . cholecalciferol (VITAMIN D) 1000 UNITS tablet Take 1,000 Units by mouth daily.  Marland Kitchen docusate sodium (COLACE) 100 MG capsule Take 100 mg by mouth daily.  . Dulaglutide (TRULICITY) 1.5 AO/1.3YQ SOPN Inject 1.5 mg into the skin every Sunday.   . finasteride (PROSCAR) 5 MG tablet Take 5 mg by mouth daily.  . furosemide (LASIX) 80 MG tablet Take 80 mg by mouth daily.  Marland Kitchen gabapentin (NEURONTIN) 100 MG capsule Take 100 mg by mouth 3 (three) times daily.  Marland Kitchen icosapent Ethyl (VASCEPA) 1 g capsule Take 2 g by mouth 2 (two) times daily.   . insulin aspart (NOVOLOG) 100 UNIT/ML injection Inject 20 Units into the skin 3 (three) times daily before meals. (Patient taking differently: Inject 20-25 Units into the  skin See admin instructions. Inject 20 units before breakfast, 20 units before lunch, and 25 units before supper)  . insulin glargine (LANTUS) 100 UNIT/ML injection Inject 1 mL (100 Units total) into the skin at bedtime. (Patient taking differently: Inject 30 Units into the skin at bedtime.)  . Investigational - Study Medication Take 1 tablet by mouth daily. Study name: IDS study medication  Additional study details: [facility] in [place]  . lisinopril (ZESTRIL) 40 MG tablet Take 40 mg by mouth daily.   . metFORMIN (GLUCOPHAGE) 1000 MG tablet Take 1,000 mg by mouth 2 (two) times daily with a meal.   . metoprolol tartrate (LOPRESSOR) 25 MG tablet Take 25 mg by mouth 2 (two) times daily.  . nortriptyline (PAMELOR) 10 MG capsule Take 30 mg by mouth at bedtime.   Marland Kitchen PARoxetine (PAXIL) 40 MG tablet Take 60 mg by mouth daily.   Marland Kitchen rOPINIRole (REQUIP) 0.5 MG tablet Take 0.5 mg by mouth at bedtime.  . simvastatin (ZOCOR) 80 MG tablet Take 40 mg by mouth at bedtime.   . tamsulosin (FLOMAX) 0.4 MG CAPS capsule Take 0.4 mg by mouth daily after lunch.   . traMADol-acetaminophen (ULTRACET) 37.5-325 MG tablet Take 1 tablet by mouth every 8 (eight) hours as needed for moderate pain.  . vitamin B-12 (CYANOCOBALAMIN) 1000 MCG tablet Take 1,000 mcg by mouth at bedtime.    Past Medical History:  Diagnosis Date  . Atrial fibrillation (  [place])   . BPH (benign prostatic hyperplasia)   . Depression   . Diabetes mellitus without complication ([place])   . Diabetic foot ulcers ([place])   . HLD (hyperlipidemia)   . Hypertension   . Neuropathy   . PTSD (post-traumatic stress disorder)   . Restless leg syndrome   . Stroke [facility])    Stoke in 2017 and mulitiple TIAs since per daughter    Past Surgical History:  Procedure Laterality Date  . BYPASS GRAFT    . CARDIAC CATHETERIZATION Left 10/28/2016   Procedure: Left Heart Cath and Coronary Angiography;  Surgeon: Corey Skains, MD;  Location: [name] CV LAB;   Service: Cardiovascular;  Laterality: Left;  . CARDIOVERSION N/A 09/25/2020   Procedure: CARDIOVERSION;  Surgeon: Corey Skains, MD;  Location: [facility] ORS;  Service: Cardiovascular;  Laterality: N/A;  . SHOULDER ARTHROSCOPY    . UVULOPALATOPHARYNGOPLASTY, TONSILLECTOMY AND SEPTOPLASTY      Social History Social History   Tobacco Use  . Smoking status: Never Smoker  . Smokeless tobacco: Never Used  Vaping Use  . Vaping Use: Never used  Substance Use Topics  . Alcohol use: No    Alcohol/week: 0.0 standard drinks  . Drug use: No    Family History Family History  Problem Relation Age of Onset  . Hypertension Other   . Diabetes Mellitus II Other   . Diabetes Mother   . Diabetes Sister   . Atrial fibrillation Sister   . Atrial fibrillation Father   . Atrial fibrillation Daughter     No Known Allergies   REVIEW OF SYSTEMS (Negative unless checked)  Constitutional: [] Weight loss  [] Fever  [] Chills Cardiac: [] Chest pain   [] Chest pressure   [] Palpitations   [] Shortness of breath when laying flat   [] Shortness of breath with exertion. Vascular:  [] Pain in legs with walking   [] Pain in legs at rest  [] History of DVT   [] Phlebitis   [] Swelling in legs   [] Varicose veins   [] Non-healing ulcers Pulmonary:   [] Uses home oxygen   [] Productive cough   [] Hemoptysis   [] Wheeze  [] COPD   [] Asthma Neurologic:  [] Dizziness   [] Seizures   [] History of stroke   [] History of TIA  [] Aphasia   [] Vissual changes   [] Weakness or numbness in arm   [] Weakness or numbness in leg Musculoskeletal:   [] Joint swelling   [] Joint pain   [] Low back pain Hematologic:  [] Easy bruising  [] Easy bleeding   [] Hypercoagulable state   [] Anemic Gastrointestinal:  [] Diarrhea   [] Vomiting  [] Gastroesophageal reflux/heartburn   [] Difficulty swallowing. Genitourinary:  [] Chronic kidney disease   [] Difficult urination  [] Frequent urination   [] Blood in urine Skin:  [] Rashes   [x] Ulcers  Psychological:  [] History of  anxiety   []  History of major depression.  Physical Examination  Vitals:   02/10/21 1428  BP: (!) 162/79  Pulse: 96  Weight: 247 lb (112 kg)  Height: 6' (1.829 m)   Body mass index is 33.5 kg/m. Gen: WD/WN, NAD Head: [place]/AT, No temporalis wasting.  Ear/Nose/Throat: Hearing grossly intact, nares w/o erythema or drainage Eyes: PER, EOMI, sclera nonicteric.  Neck: Supple, no large masses.   Pulmonary:  Good air movement, no audible wheezing bilaterally, no use of accessory muscles.  Cardiac: RRR, no JVD Vascular: Superficial ulcer left foot noninfected Vessel Right Left  Radial Palpable Palpable  PT Not Palpable Trace Palpable  DP Not Palpable Not Palpable  Gastrointestinal: Non-distended. No guarding/no peritoneal signs.  Musculoskeletal:  M/S 5/5 throughout.  No deformity or atrophy.  Neurologic: CN 2-12 intact. Symmetrical.  Speech is fluent. Motor exam as listed above. Psychiatric: Judgment intact, Mood & affect appropriate for pt's clinical situation. Dermatologic: + rashes + ulcers noted.  No changes consistent with cellulitis.   CBC Lab Results  Component Value Date   WBC 10.0 03/19/2020   HGB 11.6 (L) 03/19/2020   HCT 36.0 (L) 03/19/2020   MCV 92.1 03/19/2020   PLT 190 03/19/2020    BMET    Component Value Date/Time   NA 138 03/19/2020 0550   K 4.6 03/19/2020 0550   CL 106 03/19/2020 0550   CO2 25 03/19/2020 0550   GLUCOSE 240 (H) 03/19/2020 0550   BUN 20 03/19/2020 0550   CREATININE 1.23 03/19/2020 0550   CALCIUM 8.6 (L) 03/19/2020 0550   GFRNONAA 57 (L) 03/19/2020 0550   GFRAA >60 03/19/2020 0550   CrCl cannot be calculated (Patient's most recent lab result is older than the maximum 21 days allowed.).  COAG No results found for: INR, PROTIME  Radiology No results found.   Assessment/Plan 1. Atherosclerosis of native arteries of the extremities with ulceration ([place])  Recommend:  The patient has evidence of atherosclerosis of the lower  extremities with claudication.  The patient does not voice lifestyle limiting changes at this point in time.  Noninvasive studies do not suggest clinically significant change.  No invasive studies, angiography or surgery at this time The patient should continue walking and begin a more formal exercise program.  The patient should continue antiplatelet therapy and aggressive treatment of the lipid abnormalities  No changes in the patient's medications at this time  The patient should continue wearing graduated compression socks 10-15 mmHg strength to control the mild edema.    2. 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  Continue statin as ordered and reviewed, no changes at this time  Nitrates PRN for chest pain   3. Primary hypertension Continue antihypertensive medications as already ordered, these medications have been reviewed and there are no changes at this time.   4. Type 2 diabetes mellitus with other circulatory complication, unspecified whether long term insulin use (HCC) Continue hypoglycemic medications as already ordered, these medications have been reviewed and there are no changes at this time.  Hgb A1C to be monitored as already arranged by primary service   5. Mixed hyperlipidemia Continue statin as ordered and reviewed, no changes at this time    [name], MD  02/21/2021 2:34 PM

## 2021-05-12 ENCOUNTER — Ambulatory Visit (INDEPENDENT_AMBULATORY_CARE_PROVIDER_SITE_OTHER): Payer: Medicare Other | Admitting: Vascular Surgery

## 2021-05-12 ENCOUNTER — Other Ambulatory Visit: Payer: Self-pay

## 2021-05-12 VITALS — BP 145/69 | HR 79 | Ht 72.0 in | Wt 239.0 lb

## 2021-05-12 DIAGNOSIS — I6349 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: Secondary | ICD-10-CM | POA: Insufficient documentation

## 2021-05-12 DIAGNOSIS — J329 Chronic sinusitis, unspecified: Secondary | ICD-10-CM | POA: Insufficient documentation

## 2021-05-12 DIAGNOSIS — I25810 Atherosclerosis of coronary artery bypass graft(s) without angina pectoris: Secondary | ICD-10-CM | POA: Diagnosis not present

## 2021-05-12 DIAGNOSIS — I7025 Atherosclerosis of native arteries of other extremities with ulceration: Secondary | ICD-10-CM | POA: Diagnosis not present

## 2021-05-12 DIAGNOSIS — H524 Presbyopia: Secondary | ICD-10-CM | POA: Insufficient documentation

## 2021-05-12 DIAGNOSIS — F528 Other sexual dysfunction not due to a substance or known physiological condition: Secondary | ICD-10-CM | POA: Insufficient documentation

## 2021-05-12 DIAGNOSIS — G4730 Sleep apnea, unspecified: Secondary | ICD-10-CM | POA: Insufficient documentation

## 2021-05-12 DIAGNOSIS — I10 Essential (primary) hypertension: Secondary | ICD-10-CM | POA: Diagnosis not present

## 2021-05-12 DIAGNOSIS — I4892 Unspecified atrial flutter: Secondary | ICD-10-CM | POA: Diagnosis not present

## 2021-05-12 DIAGNOSIS — IMO0002 Reserved for concepts with insufficient information to code with codable children: Secondary | ICD-10-CM | POA: Insufficient documentation

## 2021-05-12 DIAGNOSIS — Z7901 Long term (current) use of anticoagulants: Secondary | ICD-10-CM | POA: Insufficient documentation

## 2021-05-12 DIAGNOSIS — G4710 Hypersomnia, unspecified: Secondary | ICD-10-CM | POA: Insufficient documentation

## 2021-05-12 DIAGNOSIS — H6120 Impacted cerumen, unspecified ear: Secondary | ICD-10-CM | POA: Insufficient documentation

## 2021-05-12 DIAGNOSIS — E1165 Type 2 diabetes mellitus with hyperglycemia: Secondary | ICD-10-CM | POA: Insufficient documentation

## 2021-05-12 DIAGNOSIS — F4310 Post-traumatic stress disorder, unspecified: Secondary | ICD-10-CM | POA: Insufficient documentation

## 2021-05-12 DIAGNOSIS — IMO0001 Reserved for inherently not codable concepts without codable children: Secondary | ICD-10-CM | POA: Insufficient documentation

## 2021-05-12 DIAGNOSIS — E1159 Type 2 diabetes mellitus with other circulatory complications: Secondary | ICD-10-CM

## 2021-05-12 DIAGNOSIS — B029 Zoster without complications: Secondary | ICD-10-CM | POA: Insufficient documentation

## 2021-05-13 ENCOUNTER — Encounter (INDEPENDENT_AMBULATORY_CARE_PROVIDER_SITE_OTHER): Payer: Self-pay | Admitting: Vascular Surgery

## 2021-05-13 NOTE — Progress Notes (Signed)
MRN : [id number]  [name] is a 75 y.o. (1946-05-18) male who presents with chief complaint of  Chief Complaint  Patient presents with  . Follow-up    38Mo  .  History of Present Illness:  The patient returns to the office for followup and review of the noninvasive studies. There have been no interval changes in lower extremity symptoms. No interval shortening of the patient's claudication distance or development of rest pain symptoms. No new ulcers or wounds have occurred since the last visit.  There have been no significant changes to the patient's overall health care.  The patient denies amaurosis fugax or recent TIA symptoms. There are no recent neurological changes noted. The patient denies history of DVT, PE or superficial thrombophlebitis. The patient denies recent episodes of angina or shortness of breath.    Current Meds  Medication Sig  . apixaban (ELIQUIS) 5 MG TABS tablet Take 1 tablet (5 mg total) by mouth 2 (two) times daily.  Marland Kitchen aspirin EC 81 MG tablet Take 81 mg by mouth daily. Swallow whole.  . calcium carbonate (TUMS - DOSED IN MG ELEMENTAL CALCIUM) 500 MG chewable tablet Chew 1 tablet by mouth daily as needed for indigestion or heartburn.  . cholecalciferol (VITAMIN D) 1000 UNITS tablet Take 1,000 Units by mouth daily.  Marland Kitchen docusate sodium (COLACE) 100 MG capsule Take 100 mg by mouth daily.  . Dulaglutide (TRULICITY) 1.5 CV/8.9FY SOPN Inject 1.5 mg into the skin every Sunday.   . finasteride (PROSCAR) 5 MG tablet Take 5 mg by mouth daily.  . furosemide (LASIX) 80 MG tablet Take 80 mg by mouth daily.  Marland Kitchen gabapentin (NEURONTIN) 100 MG capsule Take 100 mg by mouth 3 (three) times daily.  Marland Kitchen icosapent Ethyl (VASCEPA) 1 g capsule Take 2 g by mouth 2 (two) times daily.   . insulin aspart (NOVOLOG) 100 UNIT/ML injection Inject 20 Units into the skin 3 (three) times daily before meals. (Patient taking differently: Inject 20-25 Units into the skin See admin  instructions. Inject 20 units before breakfast, 20 units before lunch, and 25 units before supper)  . insulin glargine (LANTUS) 100 UNIT/ML injection Inject 1 mL (100 Units total) into the skin at bedtime. (Patient taking differently: Inject 30 Units into the skin at bedtime.)  . Investigational - Study Medication Take 1 tablet by mouth daily. Study name: IDS study medication  Additional study details: [facility] in [place]  . lisinopril (ZESTRIL) 40 MG tablet Take 40 mg by mouth daily.   . metFORMIN (GLUCOPHAGE) 1000 MG tablet Take 1,000 mg by mouth 2 (two) times daily with a meal.   . metoprolol tartrate (LOPRESSOR) 25 MG tablet Take 25 mg by mouth 2 (two) times daily.  . nortriptyline (PAMELOR) 10 MG capsule Take 30 mg by mouth at bedtime.   Marland Kitchen PARoxetine (PAXIL) 40 MG tablet Take 60 mg by mouth daily.   Marland Kitchen rOPINIRole (REQUIP) 0.5 MG tablet Take 0.5 mg by mouth at bedtime.  . simvastatin (ZOCOR) 80 MG tablet Take 40 mg by mouth at bedtime.   . tamsulosin (FLOMAX) 0.4 MG CAPS capsule Take 0.4 mg by mouth daily after lunch.   . traMADol-acetaminophen (ULTRACET) 37.5-325 MG tablet Take 1 tablet by mouth every 8 (eight) hours as needed for moderate pain.  . vitamin B-12 (CYANOCOBALAMIN) 1000 MCG tablet Take 1,000 mcg by mouth at bedtime.    Past Medical History:  Diagnosis Date  . Atrial fibrillation ([place])   . BPH (benign  prostatic hyperplasia)   . Depression   . Diabetes mellitus without complication ([place])   . Diabetic foot ulcers ([place])   . HLD (hyperlipidemia)   . Hypertension   . Neuropathy   . PTSD (post-traumatic stress disorder)   . Restless leg syndrome   . 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 BYPASS GRAFT    . CARDIAC CATHETERIZATION Left 10/28/2016   Procedure: Left Heart Cath and Coronary Angiography;  Surgeon: Corey Skains, MD;  Location: [place] CV LAB;  Service:  Cardiovascular;  Laterality: Left;  . CARDIOVERSION N/A 09/25/2020   Procedure: CARDIOVERSION;  Surgeon: Corey Skains, MD;  Location: [facility] ORS;  Service: Cardiovascular;  Laterality: N/A;  . SHOULDER ARTHROSCOPY    . UVULOPALATOPHARYNGOPLASTY, TONSILLECTOMY AND SEPTOPLASTY      Social History Social History   Tobacco Use  . Smoking status: Never Smoker  . Smokeless tobacco: Never Used  Vaping Use  . Vaping Use: Never used  Substance Use Topics  . Alcohol use: No    Alcohol/week: 0.0 standard drinks  . Drug use: No    Family History Family History  Problem Relation Age of Onset  . Hypertension Other   . Diabetes Mellitus II Other   . Diabetes Mother   . Diabetes Sister   . Atrial fibrillation Sister   . Atrial fibrillation Father   . Atrial fibrillation Daughter     No Known Allergies   REVIEW OF SYSTEMS (Negative unless checked)  Constitutional: [] Weight loss  [] Fever  [] Chills Cardiac: [] Chest pain   [] Chest pressure   [] Palpitations   [] Shortness of breath when laying flat   [] Shortness of breath with exertion. Vascular:  [x] Pain in legs with walking   [] Pain in legs at rest  [] History of DVT   [] Phlebitis   [] Swelling in legs   [] Varicose veins   [x] Healing ulcers Pulmonary:   [] Uses home oxygen   [] Productive cough   [] Hemoptysis   [] Wheeze  [] COPD   [] Asthma Neurologic:  [] Dizziness   [] Seizures   [] History of stroke   [] History of TIA  [] Aphasia   [] Vissual changes   [] Weakness or numbness in arm   [x] Weakness or numbness in leg Musculoskeletal:   [] Joint swelling   [x] Joint pain   [] Low back pain Hematologic:  [] Easy bruising  [] Easy bleeding   [] Hypercoagulable state   [] Anemic Gastrointestinal:  [] Diarrhea   [] Vomiting  [] Gastroesophageal reflux/heartburn   [] Difficulty swallowing. Genitourinary:  [] Chronic kidney disease   [] Difficult urination  [] Frequent urination   [] Blood in urine Skin:  [] Rashes   [] Ulcers  Psychological:  [] History of anxiety   []   History of major depression.  Physical Examination  Vitals:   05/12/21 1346  BP: (!) 145/69  Pulse: 79  Weight: 239 lb (108.4 kg)  Height: 6' (1.829 m)   Body mass index is 32.41 kg/m. Gen: WD/WN, NAD Head: [place]/AT, No temporalis wasting.  Ear/Nose/Throat: Hearing grossly intact, nares w/o erythema or drainage Eyes: PER, EOMI, sclera nonicteric.  Neck: Supple, no large masses.   Pulmonary:  Good air movement, no audible wheezing bilaterally, no use of accessory muscles.  Cardiac: RRR, no JVD Vascular: heel ulcers uninfected Vessel Right Left  Radial Palpable Palpable  PT Not Palpable Not Palpable  DP Not Palpable Not Palpable  Gastrointestinal: Non-distended. No guarding/no peritoneal signs.  Musculoskeletal: M/S 5/5 throughout.  No deformity or atrophy.  Neurologic: CN 2-12 intact. Symmetrical.  Speech is fluent. Motor exam as listed above. Psychiatric: Judgment intact, Mood & affect appropriate for pt's clinical situation. Dermatologic: No rashes or ulcers noted.  No changes consistent with cellulitis.   CBC Lab Results  Component Value Date   WBC 10.0 03/19/2020   HGB 11.6 (L) 03/19/2020   HCT 36.0 (L) 03/19/2020   MCV 92.1 03/19/2020   PLT 190 03/19/2020    BMET    Component Value Date/Time   NA 138 03/19/2020 0550   K 4.6 03/19/2020 0550   CL 106 03/19/2020 0550   CO2 25 03/19/2020 0550   GLUCOSE 240 (H) 03/19/2020 0550   BUN 20 03/19/2020 0550   CREATININE 1.23 03/19/2020 0550   CALCIUM 8.6 (L) 03/19/2020 0550   GFRNONAA 57 (L) 03/19/2020 0550   GFRAA >60 03/19/2020 0550   CrCl cannot be calculated (Patient's most recent lab result is older than the maximum 21 days allowed.).  COAG No results found for: INR, PROTIME  Radiology No results found.   Assessment/Plan 1. Atherosclerosis of native arteries of the extremities with ulceration ([place]) Recommend:  The patient is status post successful angiogram with intervention.  The patient reports that  the claudication symptoms and leg pain is essentially gone.   The patient denies lifestyle limiting changes at this point in time.  No further invasive studies, angiography or surgery at this time The patient should continue walking and begin a more formal exercise program.  The patient should continue antiplatelet therapy and aggressive treatment of the lipid abnormalities  Patient should undergo noninvasive studies as ordered. The patient will follow up with me after the studies.   - VAS [place] ABI WITH/WO TBI; Future  2. 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  Continue statin as ordered and reviewed, no changes at this time  Nitrates PRN for chest pain   3. New onset atrial flutter (HCC) Continue antiarrhythmia medications as already ordered, these medications have been reviewed and there are no changes at this time.  Continue anticoagulation as ordered by Cardiology Service   4. Primary hypertension Continue antihypertensive medications as already ordered, these medications have been reviewed and there are no changes at this time.   5. Type 2 diabetes mellitus with other circulatory complication, unspecified whether long term insulin use (HCC) Continue hypoglycemic medications as already ordered, these medications have been reviewed and there are no changes at this time.  Hgb A1C to be monitored as already arranged by primary service     [name], MD  05/13/2021 11:17 AM

## 2021-11-10 DIAGNOSIS — I959 Hypotension, unspecified: Secondary | ICD-10-CM | POA: Insufficient documentation

## 2021-11-10 DIAGNOSIS — R519 Headache, unspecified: Secondary | ICD-10-CM | POA: Insufficient documentation

## 2021-11-10 DIAGNOSIS — Z8673 Personal history of transient ischemic attack (TIA), and cerebral infarction without residual deficits: Secondary | ICD-10-CM | POA: Insufficient documentation

## 2021-11-10 DIAGNOSIS — G479 Sleep disorder, unspecified: Secondary | ICD-10-CM | POA: Insufficient documentation

## 2021-11-10 DIAGNOSIS — R42 Dizziness and giddiness: Secondary | ICD-10-CM | POA: Insufficient documentation

## 2021-11-11 ENCOUNTER — Encounter (INDEPENDENT_AMBULATORY_CARE_PROVIDER_SITE_OTHER): Payer: Medicare Other

## 2021-11-11 ENCOUNTER — Ambulatory Visit (INDEPENDENT_AMBULATORY_CARE_PROVIDER_SITE_OTHER): Payer: Medicare Other | Admitting: Vascular Surgery

## 2022-01-01 ENCOUNTER — Encounter: Payer: Self-pay | Admitting: *Deleted

## 2022-05-05 DIAGNOSIS — Z86010 Personal history of colonic polyps: Secondary | ICD-10-CM | POA: Insufficient documentation

## 2022-06-13 ENCOUNTER — Inpatient Hospital Stay
Admission: EM | Admit: 2022-06-13 | Discharge: 2022-06-17 | DRG: 253 | Disposition: A | Discharge status: Home Health | Payer: Medicare Other | Attending: Hospitalist | Admitting: Hospitalist

## 2022-06-13 ENCOUNTER — Emergency Department: Payer: Medicare Other

## 2022-06-13 ENCOUNTER — Inpatient Hospital Stay: Payer: Medicare Other

## 2022-06-13 DIAGNOSIS — F32A Depression, unspecified: Secondary | ICD-10-CM | POA: Diagnosis present

## 2022-06-13 DIAGNOSIS — E785 Hyperlipidemia, unspecified: Secondary | ICD-10-CM | POA: Diagnosis present

## 2022-06-13 DIAGNOSIS — Z8249 Family history of ischemic heart disease and other diseases of the circulatory system: Secondary | ICD-10-CM | POA: Diagnosis not present

## 2022-06-13 DIAGNOSIS — E669 Obesity, unspecified: Secondary | ICD-10-CM | POA: Diagnosis present

## 2022-06-13 DIAGNOSIS — Z6831 Body mass index (BMI) 31.0-31.9, adult: Secondary | ICD-10-CM | POA: Diagnosis not present

## 2022-06-13 DIAGNOSIS — G2581 Restless legs syndrome: Secondary | ICD-10-CM | POA: Diagnosis present

## 2022-06-13 DIAGNOSIS — I639 Cerebral infarction, unspecified: Secondary | ICD-10-CM | POA: Diagnosis present

## 2022-06-13 DIAGNOSIS — F17200 Nicotine dependence, unspecified, uncomplicated: Secondary | ICD-10-CM | POA: Diagnosis not present

## 2022-06-13 DIAGNOSIS — M868X7 Other osteomyelitis, ankle and foot: Secondary | ICD-10-CM

## 2022-06-13 DIAGNOSIS — I25810 Atherosclerosis of coronary artery bypass graft(s) without angina pectoris: Secondary | ICD-10-CM | POA: Diagnosis present

## 2022-06-13 DIAGNOSIS — E11621 Type 2 diabetes mellitus with foot ulcer: Secondary | ICD-10-CM | POA: Diagnosis present

## 2022-06-13 DIAGNOSIS — I5032 Chronic diastolic (congestive) heart failure: Secondary | ICD-10-CM | POA: Diagnosis present

## 2022-06-13 DIAGNOSIS — N400 Enlarged prostate without lower urinary tract symptoms: Secondary | ICD-10-CM | POA: Diagnosis present

## 2022-06-13 DIAGNOSIS — I2510 Atherosclerotic heart disease of native coronary artery without angina pectoris: Secondary | ICD-10-CM | POA: Diagnosis present

## 2022-06-13 DIAGNOSIS — Z7984 Long term (current) use of oral hypoglycemic drugs: Secondary | ICD-10-CM

## 2022-06-13 DIAGNOSIS — I4892 Unspecified atrial flutter: Secondary | ICD-10-CM | POA: Diagnosis present

## 2022-06-13 DIAGNOSIS — E1151 Type 2 diabetes mellitus with diabetic peripheral angiopathy without gangrene: Secondary | ICD-10-CM | POA: Diagnosis present

## 2022-06-13 DIAGNOSIS — E1169 Type 2 diabetes mellitus with other specified complication: Secondary | ICD-10-CM | POA: Diagnosis present

## 2022-06-13 DIAGNOSIS — Z7901 Long term (current) use of anticoagulants: Secondary | ICD-10-CM | POA: Diagnosis not present

## 2022-06-13 DIAGNOSIS — Z8673 Personal history of transient ischemic attack (TIA), and cerebral infarction without residual deficits: Secondary | ICD-10-CM | POA: Diagnosis not present

## 2022-06-13 DIAGNOSIS — Z7985 Long-term (current) use of injectable non-insulin antidiabetic drugs: Secondary | ICD-10-CM

## 2022-06-13 DIAGNOSIS — E1122 Type 2 diabetes mellitus with diabetic chronic kidney disease: Secondary | ICD-10-CM | POA: Diagnosis present

## 2022-06-13 DIAGNOSIS — E11628 Type 2 diabetes mellitus with other skin complications: Secondary | ICD-10-CM | POA: Diagnosis present

## 2022-06-13 DIAGNOSIS — Z794 Long term (current) use of insulin: Secondary | ICD-10-CM

## 2022-06-13 DIAGNOSIS — Z7982 Long term (current) use of aspirin: Secondary | ICD-10-CM

## 2022-06-13 DIAGNOSIS — N1831 Chronic kidney disease, stage 3a: Secondary | ICD-10-CM | POA: Diagnosis present

## 2022-06-13 DIAGNOSIS — Z825 Family history of asthma and other chronic lower respiratory diseases: Secondary | ICD-10-CM

## 2022-06-13 DIAGNOSIS — I482 Chronic atrial fibrillation, unspecified: Secondary | ICD-10-CM | POA: Diagnosis present

## 2022-06-13 DIAGNOSIS — Z821 Family history of blindness and visual loss: Secondary | ICD-10-CM

## 2022-06-13 DIAGNOSIS — Z951 Presence of aortocoronary bypass graft: Secondary | ICD-10-CM

## 2022-06-13 DIAGNOSIS — Z818 Family history of other mental and behavioral disorders: Secondary | ICD-10-CM

## 2022-06-13 DIAGNOSIS — Z79899 Other long term (current) drug therapy: Secondary | ICD-10-CM

## 2022-06-13 DIAGNOSIS — E1129 Type 2 diabetes mellitus with other diabetic kidney complication: Secondary | ICD-10-CM | POA: Diagnosis present

## 2022-06-13 DIAGNOSIS — I7090 Unspecified atherosclerosis: Secondary | ICD-10-CM

## 2022-06-13 DIAGNOSIS — S91101A Unspecified open wound of right great toe without damage to nail, initial encounter: Secondary | ICD-10-CM

## 2022-06-13 DIAGNOSIS — I4891 Unspecified atrial fibrillation: Secondary | ICD-10-CM | POA: Diagnosis not present

## 2022-06-13 DIAGNOSIS — M869 Osteomyelitis, unspecified: Secondary | ICD-10-CM | POA: Diagnosis not present

## 2022-06-13 DIAGNOSIS — I130 Hypertensive heart and chronic kidney disease with heart failure and stage 1 through stage 4 chronic kidney disease, or unspecified chronic kidney disease: Secondary | ICD-10-CM | POA: Diagnosis present

## 2022-06-13 DIAGNOSIS — I70235 Atherosclerosis of native arteries of right leg with ulceration of other part of foot: Secondary | ICD-10-CM | POA: Diagnosis not present

## 2022-06-13 DIAGNOSIS — I10 Essential (primary) hypertension: Secondary | ICD-10-CM | POA: Diagnosis not present

## 2022-06-13 DIAGNOSIS — E1142 Type 2 diabetes mellitus with diabetic polyneuropathy: Secondary | ICD-10-CM | POA: Diagnosis present

## 2022-06-13 DIAGNOSIS — Z833 Family history of diabetes mellitus: Secondary | ICD-10-CM

## 2022-06-13 DIAGNOSIS — Z87891 Personal history of nicotine dependence: Secondary | ICD-10-CM

## 2022-06-13 LAB — CBC WITH DIFFERENTIAL/PLATELET
Abs Immature Granulocytes: 0.06 10*3/uL (ref 0.00–0.07)
Basophils Absolute: 0.1 10*3/uL (ref 0.0–0.1)
Basophils Relative: 1 %
Eosinophils Absolute: 0.5 10*3/uL (ref 0.0–0.5)
Eosinophils Relative: 5 %
HCT: 38.4 % — ABNORMAL LOW (ref 39.0–52.0)
Hemoglobin: 12.3 g/dL — ABNORMAL LOW (ref 13.0–17.0)
Immature Granulocytes: 1 %
Lymphocytes Relative: 17 %
Lymphs Abs: 1.7 10*3/uL (ref 0.7–4.0)
MCH: 29.6 pg (ref 26.0–34.0)
MCHC: 32 g/dL (ref 30.0–36.0)
MCV: 92.5 fL (ref 80.0–100.0)
Monocytes Absolute: 0.9 10*3/uL (ref 0.1–1.0)
Monocytes Relative: 9 %
Neutro Abs: 6.8 10*3/uL (ref 1.7–7.7)
Neutrophils Relative %: 67 %
Platelets: 213 10*3/uL (ref 150–400)
RBC: 4.15 MIL/uL — ABNORMAL LOW (ref 4.22–5.81)
RDW: 13.7 % (ref 11.5–15.5)
WBC: 10.1 10*3/uL (ref 4.0–10.5)
nRBC: 0 % (ref 0.0–0.2)

## 2022-06-13 LAB — BRAIN NATRIURETIC PEPTIDE: B Natriuretic Peptide: 92.5 pg/mL (ref 0.0–100.0)

## 2022-06-13 LAB — COMPREHENSIVE METABOLIC PANEL
ALT: 19 U/L (ref 0–44)
AST: 20 U/L (ref 15–41)
Albumin: 3.8 g/dL (ref 3.5–5.0)
Alkaline Phosphatase: 63 U/L (ref 38–126)
Anion gap: 10 (ref 5–15)
BUN: 31 mg/dL — ABNORMAL HIGH (ref 8–23)
CO2: 25 mmol/L (ref 22–32)
Calcium: 9.1 mg/dL (ref 8.9–10.3)
Chloride: 103 mmol/L (ref 98–111)
Creatinine, Ser: 1.44 mg/dL — ABNORMAL HIGH (ref 0.61–1.24)
GFR, Estimated: 50 mL/min — ABNORMAL LOW (ref >60)
Glucose, Bld: 278 mg/dL — ABNORMAL HIGH (ref 70–99)
Potassium: 5 mmol/L (ref 3.5–5.1)
Sodium: 138 mmol/L (ref 135–145)
Total Bilirubin: 0.5 mg/dL (ref 0.3–1.2)
Total Protein: 7.1 g/dL (ref 6.5–8.1)

## 2022-06-13 LAB — CBG MONITORING, ED
Glucose-Capillary: 220 mg/dL — ABNORMAL HIGH (ref 70–99)
Glucose-Capillary: 232 mg/dL — ABNORMAL HIGH (ref 70–99)
Glucose-Capillary: 232 mg/dL — ABNORMAL HIGH (ref 70–99)

## 2022-06-13 LAB — SEDIMENTATION RATE: Sed Rate: 32 mm/hr — ABNORMAL HIGH (ref 0–20)

## 2022-06-13 LAB — C-REACTIVE PROTEIN: CRP: 0.6 mg/dL (ref <1.0)

## 2022-06-13 LAB — PROTIME-INR
INR: 1.1 (ref 0.8–1.2)
Prothrombin Time: 13.9 seconds (ref 11.4–15.2)

## 2022-06-13 LAB — APTT: aPTT: 28 seconds (ref 24–36)

## 2022-06-13 MED ORDER — TRAMADOL-ACETAMINOPHEN 37.5-325 MG PO TABS: 1.0000 | ORAL_TABLET | Freq: Three times a day (TID) | ORAL | Status: DC | PRN

## 2022-06-13 MED ORDER — INSULIN GLARGINE-YFGN 100 UNIT/ML ~~LOC~~ SOLN
60.0000 [IU] | Freq: Every day | SUBCUTANEOUS | Status: DC
  Administered 2022-06-13 – 2022-06-16 (×4): 60 [IU] via SUBCUTANEOUS
  Filled 2022-06-13 (×4): qty 0.6

## 2022-06-13 MED ORDER — ASPIRIN 81 MG PO TBEC
81.0000 mg | DELAYED_RELEASE_TABLET | Freq: Every day | ORAL | Status: DC
Start: 2022-06-13 — End: 2022-06-17
  Administered 2022-06-13 – 2022-06-17 (×4): 81 mg via ORAL
  Filled 2022-06-13 (×4): qty 1

## 2022-06-13 MED ORDER — GABAPENTIN 100 MG PO CAPS
100.0000 mg | ORAL_CAPSULE | Freq: Three times a day (TID) | ORAL | Status: DC
  Administered 2022-06-13 – 2022-06-17 (×10): 100 mg via ORAL
  Filled 2022-06-13 (×11): qty 1

## 2022-06-13 MED ORDER — FINASTERIDE 5 MG PO TABS
5.0000 mg | ORAL_TABLET | Freq: Every day | ORAL | Status: DC
  Administered 2022-06-13 – 2022-06-17 (×4): 5 mg via ORAL
  Filled 2022-06-13 (×5): qty 1

## 2022-06-13 MED ORDER — INSULIN ASPART 100 UNIT/ML IJ SOLN
0.0000 [IU] | Freq: Three times a day (TID) | INTRAMUSCULAR | Status: DC
  Administered 2022-06-13 (×2): 3 [IU] via SUBCUTANEOUS
  Administered 2022-06-14 (×2): 5 [IU] via SUBCUTANEOUS
  Administered 2022-06-15: 3 [IU] via SUBCUTANEOUS
  Administered 2022-06-15: 2 [IU] via SUBCUTANEOUS
  Administered 2022-06-15: 5 [IU] via SUBCUTANEOUS
  Administered 2022-06-16: 3 [IU] via SUBCUTANEOUS
  Administered 2022-06-16 – 2022-06-17 (×2): 2 [IU] via SUBCUTANEOUS
  Filled 2022-06-13 (×10): qty 1

## 2022-06-13 MED ORDER — PAROXETINE HCL 20 MG PO TABS
60.0000 mg | ORAL_TABLET | Freq: Every day | ORAL | Status: DC
  Administered 2022-06-13 – 2022-06-17 (×4): 60 mg via ORAL
  Filled 2022-06-13: qty 3
  Filled 2022-06-13: qty 2
  Filled 2022-06-13 (×2): qty 3
  Filled 2022-06-13: qty 2

## 2022-06-13 MED ORDER — METOPROLOL TARTRATE 25 MG PO TABS
25.0000 mg | ORAL_TABLET | Freq: Two times a day (BID) | ORAL | Status: DC
  Administered 2022-06-13 – 2022-06-17 (×8): 25 mg via ORAL
  Filled 2022-06-13 (×8): qty 1

## 2022-06-13 MED ORDER — FUROSEMIDE 40 MG PO TABS
80.0000 mg | ORAL_TABLET | Freq: Every day | ORAL | Status: DC
  Administered 2022-06-13 – 2022-06-17 (×4): 80 mg via ORAL
  Filled 2022-06-13 (×4): qty 2

## 2022-06-13 MED ORDER — INSULIN ASPART 100 UNIT/ML IJ SOLN
0.0000 [IU] | Freq: Every day | INTRAMUSCULAR | Status: DC
  Administered 2022-06-13: 2 [IU] via SUBCUTANEOUS
  Administered 2022-06-14: 5 [IU] via SUBCUTANEOUS
  Administered 2022-06-15: 4 [IU] via SUBCUTANEOUS
  Administered 2022-06-16: 2 [IU] via SUBCUTANEOUS
  Filled 2022-06-13 (×4): qty 1

## 2022-06-13 MED ORDER — DOCUSATE SODIUM 100 MG PO CAPS: 100.0000 mg | ORAL_CAPSULE | Freq: Every day | ORAL | Status: DC | PRN

## 2022-06-13 MED ORDER — CHLORHEXIDINE GLUCONATE 4 % EX LIQD: 60.0000 mL | Freq: Once | CUTANEOUS | Status: DC

## 2022-06-13 MED ORDER — HYDRALAZINE HCL 20 MG/ML IJ SOLN
5.0000 mg | INTRAMUSCULAR | Status: DC | PRN
  Administered 2022-06-16 (×2): 5 mg via INTRAVENOUS
  Filled 2022-06-13 (×2): qty 1

## 2022-06-13 MED ORDER — ROPINIROLE HCL 1 MG PO TABS
0.5000 mg | ORAL_TABLET | Freq: Every day | ORAL | Status: DC
  Administered 2022-06-13 – 2022-06-16 (×4): 0.5 mg via ORAL
  Filled 2022-06-13 (×2): qty 1
  Filled 2022-06-13: qty 2
  Filled 2022-06-13: qty 1

## 2022-06-13 MED ORDER — VITAMIN B-12 1000 MCG PO TABS
1000.0000 ug | ORAL_TABLET | Freq: Every day | ORAL | Status: DC
  Administered 2022-06-13 – 2022-06-16 (×4): 1000 ug via ORAL
  Filled 2022-06-13 (×4): qty 1

## 2022-06-13 MED ORDER — NORTRIPTYLINE HCL 10 MG PO CAPS
30.0000 mg | ORAL_CAPSULE | Freq: Every day | ORAL | Status: DC
  Administered 2022-06-13 – 2022-06-16 (×4): 30 mg via ORAL
  Filled 2022-06-13 (×4): qty 3

## 2022-06-13 MED ORDER — CALCIUM CARBONATE ANTACID 500 MG PO CHEW: 1.0000 | CHEWABLE_TABLET | Freq: Every day | ORAL | Status: DC | PRN

## 2022-06-13 MED ORDER — SIMVASTATIN 20 MG PO TABS
40.0000 mg | ORAL_TABLET | Freq: Every day | ORAL | Status: DC
  Administered 2022-06-13 – 2022-06-16 (×4): 40 mg via ORAL
  Filled 2022-06-13: qty 2
  Filled 2022-06-13: qty 4
  Filled 2022-06-13 (×2): qty 2

## 2022-06-13 MED ORDER — VITAMIN D 25 MCG (1000 UNIT) PO TABS
1000.0000 [IU] | ORAL_TABLET | Freq: Every day | ORAL | Status: DC
  Administered 2022-06-13 – 2022-06-17 (×4): 1000 [IU] via ORAL
  Filled 2022-06-13 (×4): qty 1

## 2022-06-13 MED ORDER — HEPARIN SODIUM (PORCINE) 5000 UNIT/ML IJ SOLN
5000.0000 [IU] | Freq: Three times a day (TID) | INTRAMUSCULAR | Status: DC
  Administered 2022-06-13 – 2022-06-17 (×10): 5000 [IU] via SUBCUTANEOUS
  Filled 2022-06-13 (×10): qty 1

## 2022-06-13 MED ORDER — VANCOMYCIN HCL 2000 MG/400ML IV SOLN
2000.0000 mg | Freq: Once | INTRAVENOUS | Status: DC
  Filled 2022-06-13: qty 400

## 2022-06-13 MED ORDER — DULAGLUTIDE 4.5 MG/0.5ML ~~LOC~~ SOAJ
4.5000 mg | SUBCUTANEOUS | Status: DC
Start: 2022-06-14 — End: 2022-06-14

## 2022-06-13 MED ORDER — ACETAMINOPHEN 325 MG PO TABS: 650.0000 mg | ORAL_TABLET | Freq: Four times a day (QID) | ORAL | Status: DC | PRN

## 2022-06-13 MED ORDER — ICOSAPENT ETHYL 1 G PO CAPS
2.0000 g | ORAL_CAPSULE | Freq: Two times a day (BID) | ORAL | Status: DC
  Administered 2022-06-13 – 2022-06-17 (×6): 2 g via ORAL
  Filled 2022-06-13 (×10): qty 2

## 2022-06-13 MED ORDER — ONDANSETRON HCL 4 MG/2ML IJ SOLN: 4.0000 mg | Freq: Three times a day (TID) | INTRAMUSCULAR | Status: DC | PRN

## 2022-06-13 MED ORDER — TAMSULOSIN HCL 0.4 MG PO CAPS
0.4000 mg | ORAL_CAPSULE | Freq: Every day | ORAL | Status: DC
  Administered 2022-06-14 – 2022-06-15 (×2): 0.4 mg via ORAL
  Filled 2022-06-13 (×2): qty 1

## 2022-06-13 MED ORDER — POVIDONE-IODINE 10 % EX SWAB
2.0000 | Freq: Once | CUTANEOUS | Status: DC
  Filled 2022-06-13: qty 2

## 2022-06-13 MED ORDER — PIPERACILLIN-TAZOBACTAM 3.375 G IVPB 30 MIN: 3.3750 g | Freq: Once | INTRAVENOUS | Status: DC

## 2022-06-13 NOTE — Assessment & Plan Note (Addendum)
-   Hold Eliquis for surgery -Continue metoprolol

## 2022-06-13 NOTE — ED Provider Notes (Signed)
[facility] Provider Note    Event Date/Time   First MD Initiated Contact with Patient 06/13/22 [phone]     (approximate)   History   Wound Infection   HPI  [name] is a 76 y.o. male who presents to the ED for evaluation of Wound Infection   I reviewed podiatry clinic visit from 3 days ago.  He was recommended to come to the ED for admission then due to an infected right hallux.  He was started on Augmentin. I reviewed cardiology clinic visit from 5/9.  History of CAD s/p CABG 2017, paroxysmal atrial flutter on Eliquis.  Tobacco abuse.  Patient presents to the ED at the direction of his podiatrist with a wound on his right great toe.  He reports no systemic symptoms such as fevers, nausea.  Denies any pain to this area.  Reports he had a callus there and the wound has progressed from a callus over a matter of months.  Reports compliance with his Augmentin for the past 2 days.  Physical Exam   Triage Vital Signs: ED Triage Vitals [06/13/22 0745]  Enc Vitals Group     BP (!) 156/69     Pulse Rate 81     Resp 18     Temp 97.9 F (36.6 C)     Temp Source Oral     SpO2 96 %     Weight      Height 6' (1.829 m)     Head Circumference      Peak Flow      Pain Score 0     Pain Loc      Pain Edu?      Excl. in [place]?     Most recent vital signs: Vitals:   06/13/22 0745 06/13/22 0900  BP: (!) 156/69 (!) 142/70  Pulse: 81 80  Resp: 18 16  Temp: 97.9 F (36.6 C)   SpO2: 96% 96%    General: Awake, no distress.  Obese.  Pleasant.  Well-appearing. CV:  Good peripheral perfusion.  Resp:  Normal effort.  Abd:  No distention.  MSK:  Ulcerative wound to the tip of his great toe with some proximal erythema and warmth.  As pictured below. Neuro:  No focal deficits appreciated. Other:        ED Results / Procedures / Treatments   Labs (all labs ordered are listed, but only abnormal results are displayed) Labs Reviewed  CBC WITH  DIFFERENTIAL/PLATELET - Abnormal; Notable for the following components:      Result Value   RBC 4.15 (*)    Hemoglobin 12.3 (*)    HCT 38.4 (*)    All other components within normal limits  COMPREHENSIVE METABOLIC PANEL - Abnormal; Notable for the following components:   Glucose, Bld 278 (*)    BUN 31 (*)    Creatinine, Ser 1.44 (*)    GFR, Estimated 50 (*)    All other components within normal limits    EKG   RADIOLOGY Plain film of the right great toe interpreted by me with distal lucencies concerning for osteomyelitis  Official radiology report(s): DG Toe Great Right  Result Date: 06/13/2022 CLINICAL DATA:  Infected right distal tuft great toe. Possible osteomyelitis. EXAM: RIGHT GREAT TOE COMPARISON:  None Available. FINDINGS: Mild degenerate change of the first MTP joint and first interphalangeal joint. Exam demonstrates subtle lucency and ill definition of the distal cortex/tuft of the first distal phalanx which could be  seen due to osteomyelitis. Slight irregularity of the superficial soft tissues in this area correlate to patient's soft tissue infection. Remainder of the exam is unremarkable. IMPRESSION: Subtle lucency with ill definition of the distal cortex/tuft of the first distal phalanx which could be seen due to osteomyelitis. Electronically Signed   By: Marin Olp M.D.   On: 06/13/2022 08:37    PROCEDURES and INTERVENTIONS:  Procedures  Medications  vancomycin (VANCOREADY) IVPB 2000 mg/400 mL (has no administration in time range)  piperacillin-tazobactam (ZOSYN) IVPB 3.375 g (has no administration in time range)     IMPRESSION / MDM / ASSESSMENT AND PLAN / ED COURSE  I reviewed the triage vital signs and the nursing notes.  Differential diagnosis includes, but is not limited to, cellulitis, abscess, fracture, osteomyelitis, sepsis  {Patient presents with symptoms of an acute illness or injury that is potentially life-threatening.  76 year old male on  Eliquis presents to the ED with a right great toe wound and evidence of osteomyelitis requiring medical admission.  Looks systemically well without signs of sepsis or systemic illness.  His ABIs are reassuring without signs of severe PAD or symptoms of claudication.  Blood work without leukocytosis.  CMP with CKD around baseline.  X-ray as above.  We will initiate antibiotics and consult with medicine for admission.      FINAL CLINICAL IMPRESSION(S) / ED DIAGNOSES   Final diagnoses:  Other osteomyelitis of right foot ([place])     Rx / DC Orders   ED Discharge Orders     None        Note:  This document was prepared using Dragon voice recognition software and may include unintentional dictation errors.   Vladimir Crofts, MD 06/13/22 [id number]

## 2022-06-13 NOTE — Assessment & Plan Note (Signed)
-   Continue home medications 

## 2022-06-13 NOTE — ED Notes (Signed)
RN assisted pt into bed. Hooked to vitals monitoring and given TV remote.

## 2022-06-13 NOTE — Assessment & Plan Note (Signed)
-   IV hydralazine as needed -Lasix, metoprolol

## 2022-06-13 NOTE — Assessment & Plan Note (Signed)
-   Zocor 

## 2022-06-13 NOTE — Assessment & Plan Note (Addendum)
S/p of CABG -Continue aspirin, Zocor

## 2022-06-13 NOTE — H&P (Signed)
History and Physical    [name]:[id number] DOB: 1946/02/16 DOA: 06/13/2022  Referring MD/NP/PA:   PCP: [name], MD   Patient coming from:  The patient is coming from home.  At baseline, pt is independent for most of ADL.        Chief Complaint: right great toe infection  HPI: [name] is a 76 y.o. male with medical history significant of hypertension, hyperlipidemia, diabetes mellitus, stroke, depression,, BPH, atrial fibrillation on Eliquis, CAD, CABG, tobacco abuse, dCHF, who presents with right great toe infection.  Patient states that he has a chronic ulcer in right great toe for more than 4 months.  Patient has been managed by podiatrist, Dr. Luana Shu.  Pt was seen in office by Dr. Luana Shu on 6/14, he recommend "going to the emergency room for admission to the hospital for further work-up of your infected right hallux. This needs to have a partial right hallux amputation due to the infection is present in the bone".  He has been taking Augmentin in the past 3 days.  Denies foot pain, fever, chills.  No chest pain, cough, shortness of breath.  No nausea vomiting, diarrhea or abdominal pain.  No symptoms of UTI.  He took his last dose of Eliquis last night.  Data Reviewed and ED Course: pt was found to have WBC 10.1, BNP 92.5, stable renal function, temperature normal, blood pressure 142/70, heart rate 81, RR 18, oxygen saturation 96% on room air.  X-ray of her right great toe showed evidence of osteomyelitis.  Dr. Follow-up with podiatry is consulted.   EKG: Reviewed independently, sinus rhythm, QTc 490, LAE, nonspecific T wave change   Review of Systems:   General: no fevers, chills, no body weight gain, fatigue HEENT: no blurry vision, hearing changes or sore throat Respiratory: no dyspnea, coughing, wheezing CV: no chest pain, no palpitations GI: no nausea, vomiting, abdominal pain, diarrhea, constipation GU: no dysuria, burning on  urination, increased urinary frequency, hematuria  Ext: no leg edema Neuro: no unilateral weakness, numbness, or tingling, no vision change or hearing loss Skin: has right great toe ulcer MSK: No muscle spasm, no deformity, no limitation of range of movement in spin Heme: No easy bruising.  Travel history: No recent long distant travel.   Allergy: No Known Allergies  Past Medical History:  Diagnosis Date   Atrial fibrillation (HCC)    BPH (benign prostatic hyperplasia)    Depression    Diabetes mellitus without complication ([place])    Diabetic foot ulcers ([place])    HLD (hyperlipidemia)    Hypertension    Neuropathy    PTSD (post-traumatic stress disorder)    Restless leg syndrome    Stroke [facility])    Stoke in 2017 and mulitiple TIAs since per daughter    Past Surgical History:  Procedure Laterality Date   BYPASS GRAFT     CARDIAC CATHETERIZATION Left 10/28/2016   Procedure: Left Heart Cath and Coronary Angiography;  Surgeon: Corey Skains, MD;  Location: [place] CV LAB;  Service: Cardiovascular;  Laterality: Left;   CARDIOVERSION N/A 09/25/2020   Procedure: CARDIOVERSION;  Surgeon: Corey Skains, MD;  Location: [facility] ORS;  Service: Cardiovascular;  Laterality: N/A;   SHOULDER ARTHROSCOPY     UVULOPALATOPHARYNGOPLASTY, TONSILLECTOMY AND SEPTOPLASTY      Social History:  reports that he has quit smoking. His smoking use included cigarettes. He has never used smokeless tobacco. He reports that he does not drink alcohol and does  not use drugs.  Family History:  Family History  Problem Relation Age of Onset   Hypertension Other    Diabetes Mellitus II Other    Diabetes Mother    Diabetes Sister    Atrial fibrillation Sister    Atrial fibrillation Father    Atrial fibrillation Daughter      Prior to Admission medications   Medication Sig Start Date End Date Taking? Authorizing Provider  apixaban (ELIQUIS) 5 MG TABS tablet Take 1 tablet (5 mg total) by mouth 2  (two) times daily. 03/20/20   Lavina Hamman, MD  aspirin EC 81 MG tablet Take 81 mg by mouth daily. Swallow whole.    [provider]  calcium carbonate (TUMS - DOSED IN MG ELEMENTAL CALCIUM) 500 MG chewable tablet Chew 1 tablet by mouth daily as needed for indigestion or heartburn.    [provider]  cholecalciferol (VITAMIN D) 1000 UNITS tablet Take 1,000 Units by mouth daily.    [provider]  docusate sodium (COLACE) 100 MG capsule Take 100 mg by mouth daily.    [provider]  Dulaglutide (TRULICITY) 1.5 HK/7.4QV SOPN Inject 1.5 mg into the skin every Sunday.     [provider]  finasteride (PROSCAR) 5 MG tablet Take 5 mg by mouth daily.    [provider]  furosemide (LASIX) 80 MG tablet Take 80 mg by mouth daily.    [provider]  gabapentin (NEURONTIN) 100 MG capsule Take 100 mg by mouth 3 (three) times daily.    [provider]  icosapent Ethyl (VASCEPA) 1 g capsule Take 2 g by mouth 2 (two) times daily.     [provider]  insulin aspart (NOVOLOG) 100 UNIT/ML injection Inject 20 Units into the skin 3 (three) times daily before meals. Patient taking differently: Inject 20-25 Units into the skin See admin instructions. Inject 20 units before breakfast, 20 units before lunch, and 25 units before supper 03/20/20   Lavina Hamman, MD  insulin glargine (LANTUS) 100 UNIT/ML injection Inject 1 mL (100 Units total) into the skin at bedtime. Patient taking differently: Inject 30 Units into the skin at bedtime. 03/20/20   Lavina Hamman, MD  Investigational - Study Medication Take 1 tablet by mouth daily. Study name: IDS study medication  Additional study details: [facility] in [place]    [provider]  lisinopril (ZESTRIL) 40 MG tablet Take 40 mg by mouth daily.  09/15/16   [provider]  metFORMIN (GLUCOPHAGE) 1000 MG tablet Take 1,000 mg by mouth 2 (two) times daily with a meal.      [provider]  metoprolol tartrate (LOPRESSOR) 25 MG tablet Take 25 mg by mouth 2 (two) times daily.    [provider]  nortriptyline (PAMELOR) 10 MG capsule Take 30 mg by mouth at bedtime.     [provider]  PARoxetine (PAXIL) 40 MG tablet Take 60 mg by mouth daily.     [provider]  rOPINIRole (REQUIP) 0.5 MG tablet Take 0.5 mg by mouth at bedtime.    [provider]  simvastatin (ZOCOR) 80 MG tablet Take 40 mg by mouth at bedtime.     [provider]  tamsulosin (FLOMAX) 0.4 MG CAPS capsule Take 0.4 mg by mouth daily after lunch.     [provider]  traMADol-acetaminophen (ULTRACET) 37.5-325 MG tablet Take 1 tablet by mouth every 8 (eight) hours as needed for moderate pain.  [provider]  vitamin B-12 (CYANOCOBALAMIN) 1000 MCG tablet Take 1,000 mcg by mouth at bedtime.    [provider]    Physical Exam: Vitals:   06/13/22 0900 06/13/22 1110 06/13/22 1111 06/13/22 1300  BP: (!) 142/70 (!) 167/84 (!) 167/84 (!) 152/80  Pulse: 80   85  Resp: '16 16  18  '$ Temp:   98.3 F (36.8 C) 98.4 F (36.9 C)  TempSrc:   Oral Oral  SpO2: 96%  97% 98%  Weight:    107 kg  Height:       General: Not in acute distress HEENT:       Eyes: PERRL, EOMI, no scleral icterus.       ENT: No discharge from the ears and nose, no pharynx injection, no tonsillar enlargement.        Neck: No JVD, no bruit, no mass felt. Heme: No neck lymph node enlargement. Cardiac: S1/S2, RRR, No murmurs, No gallops or rubs. Respiratory: No rales, wheezing, rhonchi or rubs. GI: Soft, nondistended, nontender, no rebound pain, no organomegaly, BS present. GU: No hematuria Ext: No pitting leg edema bilaterally. 1+DP/PT pulse bilaterally. Musculoskeletal: No joint deformities, No joint redness or warmth, no limitation of ROM in spin. Skin: Has a ulcer in the right great toe without active drainage.  No tenderness.     Neuro: Alert,  oriented X3, cranial nerves II-XII grossly intact, moves all extremities normally.  Psych: Patient is not psychotic, no suicidal or hemocidal ideation.  Labs on Admission: I have personally reviewed following labs and imaging studies  CBC: Recent Labs  Lab 06/13/22 0747  WBC 10.1  NEUTROABS 6.8  HGB 12.3*  HCT 38.4*  MCV 92.5  PLT 350   Basic Metabolic Panel: Recent Labs  Lab 06/13/22 0747  NA 138  K 5.0  CL 103  CO2 25  GLUCOSE 278*  BUN 31*  CREATININE 1.44*  CALCIUM 9.1   GFR: Estimated Creatinine Clearance: 54.3 mL/min (A) (by C-G formula based on SCr of 1.44 mg/dL (H)). Liver Function Tests: Recent Labs  Lab 06/13/22 0747  AST 20  ALT 19  ALKPHOS 63  BILITOT 0.5  PROT 7.1  ALBUMIN 3.8   No results for input(s): "LIPASE", "AMYLASE" in the last 168 hours. No results for input(s): "AMMONIA" in the last 168 hours. Coagulation Profile: Recent Labs  Lab 06/13/22 1004  INR 1.1   Cardiac Enzymes: No results for input(s): "CKTOTAL", "CKMB", "CKMBINDEX", "TROPONINI" in the last 168 hours. BNP (last 3 results) No results for input(s): "PROBNP" in the last 8760 hours. HbA1C: No results for input(s): "HGBA1C" in the last 72 hours. CBG: Recent Labs  Lab 06/13/22 1358  GLUCAP 220*   Lipid Profile: No results for input(s): "CHOL", "HDL", "LDLCALC", "TRIG", "CHOLHDL", "LDLDIRECT" in the last 72 hours. Thyroid Function Tests: No results for input(s): "TSH", "T4TOTAL", "FREET4", "T3FREE", "THYROIDAB" in the last 72 hours. Anemia Panel: No results for input(s): "VITAMINB12", "FOLATE", "FERRITIN", "TIBC", "IRON", "RETICCTPCT" in the last 72 hours. Urine analysis:    Component Value Date/Time   COLORURINE YELLOW (A) 03/18/2020 1759   APPEARANCEUR CLEAR (A) 03/18/2020 1759   LABSPEC 1.010 03/18/2020 1759   PHURINE 6.0 03/18/2020 1759   GLUCOSEU NEGATIVE 03/18/2020 1759   HGBUR NEGATIVE 03/18/2020 1759   BILIRUBINUR NEGATIVE 03/18/2020 1759   KETONESUR  NEGATIVE 03/18/2020 1759   PROTEINUR 100 (A) 03/18/2020 1759   NITRITE NEGATIVE 03/18/2020 1759   LEUKOCYTESUR NEGATIVE 03/18/2020 1759   Sepsis Labs: '@LABRCNTIP'$ (procalcitonin:4,lacticidven:4) )No  results found for this or any previous visit (from the past 240 hour(s)).   Radiological Exams on Admission: DG Toe Great Right  Result Date: 06/13/2022 CLINICAL DATA:  Infected right distal tuft great toe. Possible osteomyelitis. EXAM: RIGHT GREAT TOE COMPARISON:  None Available. FINDINGS: Mild degenerate change of the first MTP joint and first interphalangeal joint. Exam demonstrates subtle lucency and ill definition of the distal cortex/tuft of the first distal phalanx which could be seen due to osteomyelitis. Slight irregularity of the superficial soft tissues in this area correlate to patient's soft tissue infection. Remainder of the exam is unremarkable. IMPRESSION: Subtle lucency with ill definition of the distal cortex/tuft of the first distal phalanx which could be seen due to osteomyelitis. Electronically Signed   By: Marin Olp M.D.   On: 06/13/2022 08:37      Assessment/Plan Principal Problem:   Osteomyelitis of great toe of right foot (HCC) Active Problems:   Type II diabetes mellitus with renal manifestations (HCC)   Chronic diastolic CHF (congestive heart failure) (HCC)   Coronary artery disease involving coronary bypass graft of native heart without angina pectoris   Chronic kidney disease, stage 3a (HCC)   Stroke (HCC)   HTN (hypertension)   HLD (hyperlipidemia)   BPH (benign prostatic hyperplasia)   Depression   Atrial fibrillation, chronic (HCC)     Assessment and Plan: * Osteomyelitis of great toe of right foot ([place]) Patient is not septic, no fever or leukocytosis.  Hemodynamically stable.  No tachycardia or tachypnea.  Will hold off antibiotics.  Consulted with Dr. Vickki Muff of podiatry, planning to do surgery tomorrow.  -Admitted to [facility] bed as inpatient -Get  blood culture -Check INR/PTT  Type II diabetes mellitus with renal manifestations ([place]) Recent A1c 7.2, poorly controlled.  Patient is taking metformin, NovoLog, Trulicity and Lantus 90 units daily -Sliding scale insulin -Glargine insulin, 60 units daily  Chronic diastolic CHF (congestive heart failure) ([place]) 2D echo on 03/19/2020 showed EF of 55 to 60%.  Patient does not have leg edema or JVD.  BNP is normal at 92.5.  CHF is compensated. -Continue Lasix 80 mg daily  Coronary artery disease involving coronary bypass graft of native heart without angina pectoris No chest pain.  S/p of CABG -Continue aspirin, Zocor  Chronic kidney disease, stage 3a (HCC) Stable -Follow-up by BMP  Stroke ([place]) - Continue aspirin and Zocor  HTN (hypertension) - IV hydralazine as needed -Lasix, metoprolol  HLD (hyperlipidemia) - Zocor  BPH (benign prostatic hyperplasia) - Flomax and Prozac  Depression - Continue home medications  Atrial fibrillation, chronic (HCC) Heart rate 81 - Hold Eliquis for surgery -Continue metoprolol             DVT ppx: SQ Heparin     Code Status: Full code  Family Communication: I offered to call his family, but the patient states that I do not need to call his family.  Disposition Plan:  Anticipate discharge back to previous environment  Consults called:  Dr. Vickki Muff of podiatry  Admission status and Level of care: Med-Surg:    as inpt     Severity of Illness:  The appropriate patient status for this patient is INPATIENT. Inpatient status is judged to be reasonable and necessary in order to provide the required intensity of service to ensure the patient's safety. The patient's presenting symptoms, physical exam findings, and initial radiographic and laboratory data in the context of their chronic comorbidities is felt to place them at high risk  for further clinical deterioration. Furthermore, it is not anticipated that the patient will be  medically stable for discharge from the hospital within 2 midnights of admission.   * I certify that at the point of admission it is my clinical judgment that the patient will require inpatient hospital care spanning beyond 2 midnights from the point of admission due to high intensity of service, high risk for further deterioration and high frequency of surveillance required.*       Date of Service 06/13/2022    Ivor Costa [facility]   If 7PM-7AM, please contact night-coverage [email or web address] 06/13/2022, 3:31 PM

## 2022-06-13 NOTE — Assessment & Plan Note (Signed)
-   Continue aspirin and Zocor

## 2022-06-13 NOTE — ED Notes (Signed)
Pt hasn't had any of his morning medications.

## 2022-06-13 NOTE — ED Notes (Signed)
Right Arm : 162/72  Right Leg : 166/58  Left Arm : 160/62  Left Leg : 136/64

## 2022-06-13 NOTE — Assessment & Plan Note (Signed)
-   Patient stopped drinking

## 2022-06-13 NOTE — Consult Note (Signed)
ORTHOPAEDIC CONSULTATION  REQUESTING PHYSICIAN: Ivor Costa, MD  Chief Complaint: Infection right foot  HPI: [name] is a 76 y.o. male who complains of worsening infection to his right foot.  Seen in the outpatient clinic by Dr. Luana Shu.  X-ray showed osteomyelitis on the distal aspect of the great toe.  Has no longstanding history of diabetic neuropathy.  Presented to the ER for evaluation and work-up.  Denies any fever or chills.  Does have a history of cardiac issues.  Also history of peripheral vascular disease.  Was seen a year ago by vascular surgery.  Underwent angio at that time.  Denies any claudication pain at this point.  Past Medical History:  Diagnosis Date   Atrial fibrillation (HCC)    BPH (benign prostatic hyperplasia)    Depression    Diabetes mellitus without complication ([place])    Diabetic foot ulcers ([place])    HLD (hyperlipidemia)    Hypertension    Neuropathy    PTSD (post-traumatic stress disorder)    Restless leg syndrome    Stroke [facility])    Stoke in 2017 and mulitiple TIAs since per daughter   Past Surgical History:  Procedure Laterality Date   BYPASS GRAFT     CARDIAC CATHETERIZATION Left 10/28/2016   Procedure: Left Heart Cath and Coronary Angiography;  Surgeon: Corey Skains, MD;  Location: [place] CV LAB;  Service: Cardiovascular;  Laterality: Left;   CARDIOVERSION N/A 09/25/2020   Procedure: CARDIOVERSION;  Surgeon: Corey Skains, MD;  Location: [facility] ORS;  Service: Cardiovascular;  Laterality: N/A;   SHOULDER ARTHROSCOPY     UVULOPALATOPHARYNGOPLASTY, TONSILLECTOMY AND SEPTOPLASTY     Social History   Socioeconomic History   Marital status: Widowed    Spouse name: Not on file   Number of children: 2   Years of education: Not on file   Highest education level: Some college, no degree  Occupational History    Comment: retired  Tobacco Use   Smoking status: Former    Types: Cigarettes   Smokeless tobacco: Never  Vaping Use    Vaping Use: Never used  Substance and Sexual Activity   Alcohol use: No    Alcohol/week: 0.0 standard drinks of alcohol   Drug use: No   Sexual activity: Not Currently  Other Topics Concern   Not on file  Social History Narrative   Not on file   Social Determinants of Health   Financial Resource Strain: Low Risk  (03/01/2018)   Overall Financial Resource Strain (CARDIA)    Difficulty of Paying Living Expenses: Not hard at all  Food Insecurity: No Food Insecurity (03/01/2018)   Hunger Vital Sign    Worried About Running Out of Food in the Last Year: Never true    [place] in the Last Year: Never true  Transportation Needs: No Transportation Needs (03/01/2018)   PRAPARE - Hydrologist (Medical): No    Lack of Transportation (Non-Medical): No  Physical Activity: Inactive (03/01/2018)   Exercise Vital Sign    Days of Exercise per Week: 0 days    Minutes of Exercise per Session: 0 min  Stress: No Stress Concern Present (03/01/2018)   [place]    Feeling of Stress : Not at all  Social Connections: Unknown (03/01/2018)   Social Connection and Isolation Panel [NHANES]    Frequency of Communication with Friends and Family: Not on file  Frequency of Social Gatherings with Friends and Family: Not on file    Attends Religious Services: Never    Active Member of Clubs or Organizations: No    Attends Archivist Meetings: Never    Marital Status: Widowed   Family History  Problem Relation Age of Onset   Hypertension Other    Diabetes Mellitus II Other    Diabetes Mother    Diabetes Sister    Atrial fibrillation Sister    Atrial fibrillation Father    Atrial fibrillation Daughter    No Known Allergies Prior to Admission medications   Medication Sig Start Date End Date Taking? Authorizing Provider  amoxicillin-clavulanate (AUGMENTIN) 875-125 MG tablet Take 1 tablet by mouth in  the morning and at bedtime. 06/10/22  Yes [provider]  apixaban (ELIQUIS) 5 MG TABS tablet Take 1 tablet (5 mg total) by mouth 2 (two) times daily. 03/20/20  Yes Lavina Hamman, MD  aspirin EC 81 MG tablet Take 81 mg by mouth daily. Swallow whole.   Yes [provider]  calcium carbonate (TUMS - DOSED IN MG ELEMENTAL CALCIUM) 500 MG chewable tablet Chew 1 tablet by mouth daily as needed for indigestion or heartburn.   Yes [provider]  cholecalciferol (VITAMIN D) 1000 UNITS tablet Take 1,000 Units by mouth daily.   Yes [provider]  docusate sodium (COLACE) 100 MG capsule Take 100 mg by mouth daily.   Yes [provider]  finasteride (PROSCAR) 5 MG tablet Take 5 mg by mouth daily.   Yes [provider]  furosemide (LASIX) 80 MG tablet Take 80 mg by mouth daily.   Yes [provider]  gabapentin (NEURONTIN) 100 MG capsule Take 100 mg by mouth 3 (three) times daily.   Yes [provider]  icosapent Ethyl (VASCEPA) 1 g capsule Take 2 g by mouth 2 (two) times daily.    Yes [provider]  insulin aspart (NOVOLOG) 100 UNIT/ML injection Inject 20 Units into the skin 3 (three) times daily before meals. Patient taking differently: Inject 20-25 Units into the skin See admin instructions. Inject 20 units before breakfast, 20 units before lunch, and 25 units before supper 03/20/20  Yes Lavina Hamman, MD  insulin glargine (LANTUS) 100 UNIT/ML injection Inject 1 mL (100 Units total) into the skin at bedtime. Patient taking differently: Inject 90 Units into the skin at bedtime. 03/20/20  Yes Lavina Hamman, MD  metFORMIN (GLUCOPHAGE) 1000 MG tablet Take 1,000 mg by mouth 2 (two) times daily with a meal.    Yes [provider]  metoprolol tartrate (LOPRESSOR) 25 MG tablet Take 25 mg by mouth 2 (two) times daily.   Yes [provider]  nortriptyline (PAMELOR) 10 MG capsule Take 30 mg by mouth at bedtime.     Yes [provider]  PARoxetine (PAXIL) 40 MG tablet Take 60 mg by mouth daily.    Yes [provider]  rOPINIRole (REQUIP) 0.5 MG tablet Take 0.5 mg by mouth at bedtime.   Yes [provider]  simvastatin (ZOCOR) 80 MG tablet Take 40 mg by mouth at bedtime.    Yes [provider]  tamsulosin (FLOMAX) 0.4 MG CAPS capsule Take 0.4 mg by mouth daily after lunch.    Yes [provider]  traMADol-acetaminophen (ULTRACET) 37.5-325 MG tablet Take 1 tablet by mouth every 8 (eight) hours as needed for moderate pain.   Yes [provider]  vitamin B-12 (CYANOCOBALAMIN) 1000  MCG tablet Take 1,000 mcg by mouth at bedtime.   Yes [provider]  Dulaglutide (TRULICITY) 1.5 TZ/0.0FV SOPN Inject 1.5 mg into the skin every Sunday.  Patient not taking: Reported on 06/13/2022    [provider]  Investigational - Study Medication Take 1 tablet by mouth daily. Study name: IDS study medication  Additional study details: [facility] in [place]    [provider]  lisinopril (ZESTRIL) 40 MG tablet Take 40 mg by mouth daily.  Patient not taking: Reported on 06/13/2022 09/15/16   [provider]  TRULICITY 4.5 CB/4.4HQ SOPN Inject into the skin. 05/09/22   [provider]   DG Toe Great Right  Result Date: 06/13/2022 CLINICAL DATA:  Infected right distal tuft great toe. Possible osteomyelitis. EXAM: RIGHT GREAT TOE COMPARISON:  None Available. FINDINGS: Mild degenerate change of the first MTP joint and first interphalangeal joint. Exam demonstrates subtle lucency and ill definition of the distal cortex/tuft of the first distal phalanx which could be seen due to osteomyelitis. Slight irregularity of the superficial soft tissues in this area correlate to patient's soft tissue infection. Remainder of the exam is unremarkable. IMPRESSION: Subtle lucency with ill definition of the distal cortex/tuft of the first distal phalanx  which could be seen due to osteomyelitis. Electronically Signed   By: Marin Olp M.D.   On: 06/13/2022 08:37    Positive ROS: All other systems have been reviewed and were otherwise negative with the exception of those mentioned in the HPI and as above.  12 point ROS was performed.  Physical Exam: General: Alert and oriented.  No apparent distress.  Vascular:  Left foot:Dorsalis Pedis:  present Posterior Tibial:  present  Right foot: Dorsalis Pedis:  diminished Posterior Tibial:  diminished  Doppler evaluation reveals a monophasic dorsalis pedis pulse and a biphasic posterior tibial pulse on the right lower extremity.  Pulses on the left foot were strongly palpable today.  Neuro:absent protective sensation  Derm: Left foot without ulceration  Right foot distal aspect has a small ulceration on the very distal aspect of the toe with mild erythema.  This probes down to the level of bone.  There is no purulence at this time.  No lymphangitic streaking.  Ortho/MS: Painful range of motion of his ankle subtalar midtarsal metatarsophalangeal joints.  Full muscle strength to the lower extremities are noted.  I did review x-rays personally which reveals small areas of erosive change on the very distal aspect of the right great toe.  Consistent with osteomyelitis.  Assessment: Osteomyelitis right great toe Diabetes with neuropathy Peripheral arterial disease  Plan: He has obvious osteomyelitis on the x-ray to the distal aspect of his right great toe.  He has a barely palpable DP pulse and that is easily evaluated with Doppler ultrasound monophasic in nature.  He has a biphasic posterior tibial pulse on the right side.  His capillary fill time is brisk to the digits.  At this point we will plan to proceed with surgical intervention.  If he has difficulty with perfusion will likely involved vascular surgery at that time.  I will order ABIs at this time for further vascular work-up.  I discussed  with the patient surgical intervention.  The risk benefits alternatives and complications associated with surgery were discussed with the patient in detail and consent was given verbally.  Plan for surgery in the morning.    Elesa Hacker, DPM Cell [phone]   06/13/2022 2:08 PM

## 2022-06-13 NOTE — Assessment & Plan Note (Signed)
Stable -Follow-up by BMP

## 2022-06-13 NOTE — ED Triage Notes (Signed)
Patient to ER via [facility] from home. Reports being sent to the ER for further evaluation for his infected right hallux, paperwork states that he needs a partial right hallux amputation due to the infection present in the bone.   Patient has been taking Augmentin as prescribed. Patient's paperwork also states that he needs admission to the hospital for further work up in regards to the blood flow to check the circulation prior to the amputation.

## 2022-06-13 NOTE — Assessment & Plan Note (Addendum)
S/p toe amputation on 6/18 Patient is not septic, no fever or leukocytosis.   Plan: --start IV vanc and cefepime for empiric coverage, per Dr. Vickki Muff --vascular consult for right LE angiogram

## 2022-06-13 NOTE — Assessment & Plan Note (Signed)
2D echo on 03/19/2020 showed EF of 55 to 60%.  Patient does not have leg edema or JVD.  BNP is normal at 92.5.  CHF is compensated. -Continue Lasix 80 mg daily

## 2022-06-13 NOTE — Assessment & Plan Note (Addendum)
-   Flomax and finasteride

## 2022-06-13 NOTE — Assessment & Plan Note (Addendum)
Recent A1c 9.1, poorly controlled.   Patient is taking metformin, NovoLog, Trulicity and Lantus 90 units daily Plan: -Sliding scale insulin -Glargine insulin, 60 units daily --mealtime 5u TID

## 2022-06-14 ENCOUNTER — Inpatient Hospital Stay: Payer: Medicare Other | Admitting: Certified Registered"

## 2022-06-14 ENCOUNTER — Other Ambulatory Visit: Payer: Self-pay

## 2022-06-14 ENCOUNTER — Encounter
Admission: EM | Disposition: A | Discharge status: Home Health | Payer: Self-pay | Source: Home / Self Care | Attending: Hospitalist

## 2022-06-14 DIAGNOSIS — M869 Osteomyelitis, unspecified: Secondary | ICD-10-CM | POA: Diagnosis not present

## 2022-06-14 HISTORY — PX: AMPUTATION TOE: SHX6595

## 2022-06-14 LAB — GLUCOSE, CAPILLARY
Glucose-Capillary: 260 mg/dL — ABNORMAL HIGH (ref 70–99)
Glucose-Capillary: 292 mg/dL — ABNORMAL HIGH (ref 70–99)
Glucose-Capillary: 430 mg/dL — ABNORMAL HIGH (ref 70–99)

## 2022-06-14 LAB — CBC
HCT: 36.6 % — ABNORMAL LOW (ref 39.0–52.0)
Hemoglobin: 11.9 g/dL — ABNORMAL LOW (ref 13.0–17.0)
MCH: 29.7 pg (ref 26.0–34.0)
MCHC: 32.5 g/dL (ref 30.0–36.0)
MCV: 91.3 fL (ref 80.0–100.0)
Platelets: 189 10*3/uL (ref 150–400)
RBC: 4.01 MIL/uL — ABNORMAL LOW (ref 4.22–5.81)
RDW: 13.3 % (ref 11.5–15.5)
WBC: 8.4 10*3/uL (ref 4.0–10.5)
nRBC: 0 % (ref 0.0–0.2)

## 2022-06-14 LAB — CBG MONITORING, ED
Glucose-Capillary: 229 mg/dL — ABNORMAL HIGH (ref 70–99)
Glucose-Capillary: 230 mg/dL — ABNORMAL HIGH (ref 70–99)

## 2022-06-14 LAB — GLUCOSE, RANDOM: Glucose, Bld: 434 mg/dL — ABNORMAL HIGH (ref 70–99)

## 2022-06-14 SURGERY — AMPUTATION, TOE
Anesthesia: Monitor Anesthesia Care | Site: Toe | Laterality: Right

## 2022-06-14 MED ORDER — 0.9 % SODIUM CHLORIDE (POUR BTL) OPTIME
TOPICAL | Status: DC | PRN
  Administered 2022-06-14: 1000 mL

## 2022-06-14 MED ORDER — EPHEDRINE SULFATE (PRESSORS) 50 MG/ML IJ SOLN
INTRAMUSCULAR | Status: DC | PRN
  Administered 2022-06-14: 10 mg via INTRAVENOUS
  Administered 2022-06-14: 15 mg via INTRAVENOUS

## 2022-06-14 MED ORDER — LACTATED RINGERS IV SOLN: INTRAVENOUS | Status: DC

## 2022-06-14 MED ORDER — VANCOMYCIN HCL 2000 MG/400ML IV SOLN
2000.0000 mg | Freq: Once | INTRAVENOUS | Status: DC
  Administered 2022-06-14: 2000 mg via INTRAVENOUS
  Filled 2022-06-14: qty 400

## 2022-06-14 MED ORDER — PROPOFOL 10 MG/ML IV BOLUS
INTRAVENOUS | Status: AC
  Filled 2022-06-14: qty 20

## 2022-06-14 MED ORDER — PHENYLEPHRINE HCL (PRESSORS) 10 MG/ML IV SOLN
INTRAVENOUS | Status: DC | PRN
  Administered 2022-06-14 (×2): 160 ug via INTRAVENOUS
  Administered 2022-06-14: 80 ug via INTRAVENOUS

## 2022-06-14 MED ORDER — SODIUM CHLORIDE 0.9 % IV SOLN: INTRAVENOUS | Status: DC | PRN

## 2022-06-14 MED ORDER — LIDOCAINE HCL (PF) 1 % IJ SOLN
INTRAMUSCULAR | Status: DC | PRN
  Administered 2022-06-14: 3 mL

## 2022-06-14 MED ORDER — FENTANYL CITRATE (PF) 100 MCG/2ML IJ SOLN
INTRAMUSCULAR | Status: DC | PRN
Start: 2022-06-14 — End: 2022-06-14
  Administered 2022-06-14 (×2): 50 ug via INTRAVENOUS

## 2022-06-14 MED ORDER — VANCOMYCIN HCL 750 MG/150ML IV SOLN
750.0000 mg | Freq: Two times a day (BID) | INTRAVENOUS | Status: DC
  Administered 2022-06-15: 750 mg via INTRAVENOUS
  Filled 2022-06-14 (×2): qty 150

## 2022-06-14 MED ORDER — SODIUM CHLORIDE 0.9 % IV SOLN
2.0000 g | Freq: Two times a day (BID) | INTRAVENOUS | Status: DC
  Administered 2022-06-14 – 2022-06-16 (×5): 2 g via INTRAVENOUS
  Filled 2022-06-14: qty 2
  Filled 2022-06-14 (×4): qty 12.5
  Filled 2022-06-14: qty 2

## 2022-06-14 MED ORDER — INSULIN ASPART 100 UNIT/ML IJ SOLN
1.0000 [IU] | Freq: Once | INTRAMUSCULAR | Status: AC
Start: 2022-06-14 — End: 2022-06-14
  Administered 2022-06-14: 1 [IU] via SUBCUTANEOUS
  Filled 2022-06-14: qty 1

## 2022-06-14 MED ORDER — ONDANSETRON HCL 4 MG/2ML IJ SOLN: 4.0000 mg | Freq: Once | INTRAMUSCULAR | Status: DC | PRN

## 2022-06-14 MED ORDER — PROPOFOL 10 MG/ML IV BOLUS
INTRAVENOUS | Status: DC | PRN
  Administered 2022-06-14: 50 mg via INTRAVENOUS

## 2022-06-14 MED ORDER — PROPOFOL 500 MG/50ML IV EMUL
INTRAVENOUS | Status: DC | PRN
  Administered 2022-06-14: 75 ug/kg/min via INTRAVENOUS
  Administered 2022-06-14: 50 ug/kg/min via INTRAVENOUS

## 2022-06-14 MED ORDER — BUPIVACAINE HCL 0.5 % IJ SOLN
INTRAMUSCULAR | Status: DC | PRN
  Administered 2022-06-14: 3 mL

## 2022-06-14 MED ORDER — FENTANYL CITRATE (PF) 100 MCG/2ML IJ SOLN
INTRAMUSCULAR | Status: AC
  Filled 2022-06-14: qty 2

## 2022-06-14 SURGICAL SUPPLY — 47 items
BLADE OSC/SAGITTAL MD 5.5X18 (BLADE) ×2 IMPLANT
BLADE SURG MINI STRL (BLADE) ×2 IMPLANT
BNDG CONFORM 3 STRL LF (GAUZE/BANDAGES/DRESSINGS) ×4 IMPLANT
BNDG ELASTIC 4X5.8 VLCR NS LF (GAUZE/BANDAGES/DRESSINGS) ×2 IMPLANT
BNDG ELASTIC 4X5.8 VLCR STR LF (GAUZE/BANDAGES/DRESSINGS) ×1 IMPLANT
BNDG ESMARK 4X12 TAN STRL LF (GAUZE/BANDAGES/DRESSINGS) ×2 IMPLANT
BNDG GAUZE DERMACEA FLUFF (GAUZE/BANDAGES/DRESSINGS) ×1
BNDG GAUZE DERMACEA FLUFF 4 (GAUZE/BANDAGES/DRESSINGS) ×1 IMPLANT
BNDG STRETCH 4X75 STRL LF (GAUZE/BANDAGES/DRESSINGS) ×1 IMPLANT
CUFF TOURN SGL QUICK 12 (TOURNIQUET CUFF) IMPLANT
CUFF TOURN SGL QUICK 18X4 (TOURNIQUET CUFF) IMPLANT
DRAPE FLUOR MINI C-ARM 54X84 (DRAPES) ×2 IMPLANT
DRAPE XRAY CASSETTE 23X24 (DRAPES) ×2 IMPLANT
DURAPREP 26ML APPLICATOR (WOUND CARE) ×2 IMPLANT
ELECT REM PT RETURN 9FT ADLT (ELECTROSURGICAL) ×2
ELECTRODE REM PT RTRN 9FT ADLT (ELECTROSURGICAL) ×1 IMPLANT
GAUZE PACKING IODOFORM 1/2 (PACKING) ×2 IMPLANT
GAUZE SPONGE 4X4 12PLY STRL (GAUZE/BANDAGES/DRESSINGS) ×2 IMPLANT
GAUZE STRETCH 2X75IN STRL (MISCELLANEOUS) ×2 IMPLANT
GAUZE XEROFORM 1X8 LF (GAUZE/BANDAGES/DRESSINGS) ×2 IMPLANT
GLOVE BIO SURGEON STRL SZ7.5 (GLOVE) ×2 IMPLANT
GLOVE SURG UNDER LTX SZ8 (GLOVE) ×2 IMPLANT
GOWN STRL REUS W/ TWL XL LVL3 (GOWN DISPOSABLE) ×2 IMPLANT
GOWN STRL REUS W/TWL XL LVL3 (GOWN DISPOSABLE) ×4
IV NS IRRIG 3000ML ARTHROMATIC (IV SOLUTION) ×2 IMPLANT
KIT TURNOVER KIT A (KITS) ×2 IMPLANT
LABEL OR SOLS (LABEL) ×2 IMPLANT
MANIFOLD NEPTUNE II (INSTRUMENTS) ×2 IMPLANT
NDL FILTER BLUNT 18X1 1/2 (NEEDLE) ×1 IMPLANT
NDL HYPO 25X1 1.5 SAFETY (NEEDLE) ×1 IMPLANT
NEEDLE FILTER BLUNT 18X 1/2SAF (NEEDLE) ×1
NEEDLE FILTER BLUNT 18X1 1/2 (NEEDLE) ×1 IMPLANT
NEEDLE HYPO 25X1 1.5 SAFETY (NEEDLE) ×2 IMPLANT
NS IRRIG 500ML POUR BTL (IV SOLUTION) ×2 IMPLANT
PACK EXTREMITY ARMC (MISCELLANEOUS) ×2 IMPLANT
PAD ABD DERMACEA PRESS 5X9 (GAUZE/BANDAGES/DRESSINGS) ×3 IMPLANT
PULSAVAC PLUS IRRIG FAN TIP (DISPOSABLE) ×2
SHIELD FULL FACE ANTIFOG 7M (MISCELLANEOUS) ×2 IMPLANT
STOCKINETTE M/LG 89821 (MISCELLANEOUS) ×2 IMPLANT
STRAP SAFETY 5IN WIDE (MISCELLANEOUS) ×2 IMPLANT
SUT ETHILON 3-0 FS-10 30 BLK (SUTURE) ×2
SUT ETHILON 5-0 FS-2 18 BLK (SUTURE) ×2 IMPLANT
SUT VIC AB 4-0 FS2 27 (SUTURE) ×2 IMPLANT
SUTURE EHLN 3-0 FS-10 30 BLK (SUTURE) ×1 IMPLANT
SYR 10ML LL (SYRINGE) ×6 IMPLANT
TIP FAN IRRIG PULSAVAC PLUS (DISPOSABLE) ×1 IMPLANT
WATER STERILE IRR 500ML POUR (IV SOLUTION) ×2 IMPLANT

## 2022-06-14 NOTE — Consult Note (Signed)
[facility] Consult Note  MRN : [id number]  [name] is a 76 y.o. (Jun 21, 1946) male who presents with chief complaint of  Chief Complaint  Patient presents with   Wound Infection  .  History of Present Illness: Patient known to service- Dr. Delana Meyer- last evaluated in 2022. History of  Non healing LEFT foot wound. History of AFIB- on Eliquis/ASA, CAD, DM, Diabetic neuropathy. Recently developed RIGHT great toe wound- non healing osteomyelitis. Managed per Podiatry. Presented to ED with worsening RIGHT great toe wound. Denies claudication or rest pain. Denies chest pain, TIA symptoms. Only uses a cane to help with balance, otherwise able to ambulate without difficulty. ABI obtained this admission RIGHT- 0.75/ Left- 1.1- this is unchanged from prior studies.  Current Facility-Administered Medications  Medication Dose Route Frequency Provider Last Rate Last Admin   acetaminophen (TYLENOL) tablet 650 mg  650 mg Oral Q6H PRN Samara Deist, DPM       aspirin EC tablet 81 mg  81 mg Oral Daily Samara Deist, DPM   81 mg at 06/14/22 0959   calcium carbonate (TUMS - dosed in mg elemental calcium) chewable tablet 200 mg of elemental calcium  1 tablet Oral Daily PRN Samara Deist, DPM       cholecalciferol (VITAMIN D3) tablet 1,000 Units  1,000 Units Oral Daily Samara Deist, DPM   1,000 Units at 06/14/22 0263   docusate sodium (COLACE) capsule 100 mg  100 mg Oral Daily PRN Samara Deist, DPM       Dulaglutide SOPN 4.5 mg  4.5 mg Subcutaneous Weekly Samara Deist, DPM       finasteride (PROSCAR) tablet 5 mg  5 mg Oral Daily Samara Deist, DPM   5 mg at 06/14/22 0959   furosemide (LASIX) tablet 80 mg  80 mg Oral Daily Samara Deist, DPM   80 mg at 06/14/22 0959   gabapentin (NEURONTIN) capsule 100 mg  100 mg Oral TID Samara Deist, DPM   100 mg at 06/14/22 0959   heparin injection 5,000 Units  5,000 Units Subcutaneous Q8H Samara Deist, DPM   5,000  Units at 06/13/22 2320   hydrALAZINE (APRESOLINE) injection 5 mg  5 mg Intravenous Q2H PRN Samara Deist, DPM       icosapent Ethyl (VASCEPA) 1 g capsule 2 g  2 g Oral BID Samara Deist, DPM   2 g at 06/13/22 2320   insulin aspart (novoLOG) injection 0-5 Units  0-5 Units Subcutaneous QHS Samara Deist, DPM   2 Units at 06/13/22 2332   insulin aspart (novoLOG) injection 0-9 Units  0-9 Units Subcutaneous TID WC Samara Deist, DPM   3 Units at 06/13/22 1711   insulin glargine-yfgn (SEMGLEE) injection 60 Units  60 Units Subcutaneous QHS Samara Deist, DPM   60 Units at 06/13/22 2333   metoprolol tartrate (LOPRESSOR) tablet 25 mg  25 mg Oral BID Samara Deist, DPM   25 mg at 06/14/22 0959   nortriptyline (PAMELOR) capsule 30 mg  30 mg Oral QHS Samara Deist, DPM   30 mg at 06/13/22 2321   ondansetron (ZOFRAN) injection 4 mg  4 mg Intravenous Q8H PRN Samara Deist, DPM       PARoxetine (PAXIL) tablet 60 mg  60 mg Oral Daily Samara Deist, DPM   60 mg at 06/14/22 0959   rOPINIRole (REQUIP) tablet 0.5 mg  0.5 mg Oral QHS Samara Deist, DPM   0.5 mg at 06/13/22 2319   simvastatin (ZOCOR) tablet 40 mg  40 mg Oral QHS Samara Deist, DPM   40 mg at 06/13/22 2318   tamsulosin (FLOMAX) capsule 0.4 mg  0.4 mg Oral QPC lunch Samara Deist, DPM       traMADol-acetaminophen (ULTRACET) 37.5-325 MG per tablet 1 tablet  1 tablet Oral Q8H PRN Samara Deist, DPM       vitamin B-12 (CYANOCOBALAMIN) tablet 1,000 mcg  1,000 mcg Oral QHS Samara Deist, DPM   1,000 mcg at 06/13/22 2321    Past Medical History:  Diagnosis Date   Atrial fibrillation (HCC)    BPH (benign prostatic hyperplasia)    Depression    Diabetes mellitus without complication ([place])    Diabetic foot ulcers ([place])    HLD (hyperlipidemia)    Hypertension    Neuropathy    PTSD (post-traumatic stress disorder)    Restless leg syndrome    Stroke [facility])    Stoke in 2017 and mulitiple TIAs since per daughter    Past Surgical History:   Procedure Laterality Date   BYPASS GRAFT     CARDIAC CATHETERIZATION Left 10/28/2016   Procedure: Left Heart Cath and Coronary Angiography;  Surgeon: Corey Skains, MD;  Location: [facility];  Service: Cardiovascular;  Laterality: Left;   CARDIOVERSION N/A 09/25/2020   Procedure: CARDIOVERSION;  Surgeon: Corey Skains, MD;  Location: [facility] ORS;  Service: Cardiovascular;  Laterality: N/A;   SHOULDER ARTHROSCOPY     UVULOPALATOPHARYNGOPLASTY, TONSILLECTOMY AND SEPTOPLASTY      Social History Social History   Tobacco Use   Smoking status: Former    Types: Cigarettes   Smokeless tobacco: Never  Vaping Use   Vaping Use: Never used  Substance Use Topics   Alcohol use: No    Alcohol/week: 0.0 standard drinks of alcohol   Drug use: No    Family History Family History  Problem Relation Age of Onset   Hypertension Other    Diabetes Mellitus II Other    Diabetes Mother    Diabetes Sister    Atrial fibrillation Sister    Atrial fibrillation Father    Atrial fibrillation Daughter     No Known Allergies   REVIEW OF SYSTEMS (Negative unless checked)  Constitutional: '[]'$ Weight loss  '[]'$ Fever  '[]'$ Chills Cardiac: '[]'$ Chest pain   '[]'$ Chest pressure   '[]'$ Palpitations   '[]'$ Shortness of breath when laying flat   '[]'$ Shortness of breath at rest   '[]'$ Shortness of breath with exertion. Vascular:  '[]'$ Pain in legs with walking   '[]'$ Pain in legs at rest   '[]'$ Pain in legs when laying flat   '[]'$ Claudication   '[]'$ Pain in feet when walking  '[]'$ Pain in feet at rest  '[]'$ Pain in feet when laying flat   '[]'$ History of DVT   '[]'$ Phlebitis   '[]'$ Swelling in legs   '[]'$ Varicose veins   '[x]'$ Non-healing ulcers Pulmonary:   '[]'$ Uses home oxygen   '[]'$ Productive cough   '[]'$ Hemoptysis   '[]'$ Wheeze  '[]'$ COPD   '[]'$ Asthma Neurologic:  '[]'$ Dizziness  '[]'$ Blackouts   '[]'$ Seizures   '[]'$ History of stroke   '[]'$ History of TIA  '[]'$ Aphasia   '[]'$ Temporary blindness   '[]'$ Dysphagia   '[]'$ Weakness or numbness in arms   '[]'$ Weakness or numbness in  legs Musculoskeletal:  '[]'$ Arthritis   '[]'$ Joint swelling   '[]'$ Joint pain   '[]'$ Low back pain Hematologic:  '[]'$ Easy bruising  '[]'$ Easy bleeding   '[]'$ Hypercoagulable state   '[]'$ Anemic  '[]'$ Hepatitis Gastrointestinal:  '[]'$ Blood in stool   '[]'$ Vomiting blood  '[]'$ Gastroesophageal reflux/heartburn   '[]'$ Difficulty swallowing. Genitourinary:  '[]'$ Chronic kidney disease   '[]'$   Difficult urination  '[]'$ Frequent urination  '[]'$ Burning with urination   '[]'$ Blood in urine Skin:  '[]'$ Rashes   '[]'$ Ulcers   '[]'$ Wounds Psychological:  '[]'$ History of anxiety   '[]'$  History of major depression.  Physical Examination  Vitals:   06/14/22 0815 06/14/22 0817 06/14/22 0830 06/14/22 0909  BP: 136/67 136/67 134/62 (!) 141/70  Pulse: 69 71 68 67  Resp: '16 16 17 15  '$ Temp:  (!) 97.4 F (36.3 C)  97.8 F (36.6 C)  TempSrc:      SpO2: (!) 89% 94% 94% 94%  Weight:      Height:       Body mass index is 31.99 kg/m. Gen:  WD/WN, NAD Eyes: Sclera non-icteric, conjunctiva clear Neck: Trachea midline.  No JVD.  Pulmonary:  Good air movement, respirations not labored, equal bilaterally.  Cardiac: RRR, normal S1, S2. Vascular:  Vessel Right Left  Radial Palpable Palpable  Ulnar    Brachial    Carotid Palpable, without bruit Palpable, without bruit  Aorta Not palpable N/A  Femoral Palpable Palpable  Popliteal    PT    DP Monophasic doppler Triphasic   Gastrointestinal: soft, non-tender/non-distended. No guarding/reflex.  Musculoskeletal: M/S 5/5 throughout.  Right foot surgical dressing in place.  Leg warm, + edema. Neurologic: Sensation grossly intact in extremities.  Symmetrical.  Speech is fluent. Motor exam as listed above. Psychiatric: Judgment intact, Mood & affect appropriate for pt's clinical situation.      CBC Lab Results  Component Value Date   WBC 8.4 06/14/2022   HGB 11.9 (L) 06/14/2022   HCT 36.6 (L) 06/14/2022   MCV 91.3 06/14/2022   PLT 189 06/14/2022    BMET    Component Value Date/Time   NA 138 06/13/2022 0747    K 5.0 06/13/2022 0747   CL 103 06/13/2022 0747   CO2 25 06/13/2022 0747   GLUCOSE 278 (H) 06/13/2022 0747   BUN 31 (H) 06/13/2022 0747   CREATININE 1.44 (H) 06/13/2022 0747   CALCIUM 9.1 06/13/2022 0747   GFRNONAA 50 (L) 06/13/2022 0747   GFRAA >60 03/19/2020 0550   Estimated Creatinine Clearance: 54.3 mL/min (A) (by C-G formula based on SCr of 1.44 mg/dL (H)).  COAG Lab Results  Component Value Date   INR 1.1 06/13/2022    Radiology US ARTERIAL ABI (SCREENING LOWER EXTREMITY)  Result Date: 06/13/2022 CLINICAL DATA:  76 year old male with right great toe wound. EXAM: NONINVASIVE PHYSIOLOGIC VASCULAR STUDY OF BILATERAL LOWER EXTREMITIES TECHNIQUE: Evaluation of both lower extremities were performed at rest, including calculation of ankle-brachial indices with single level Doppler, pressure and pulse volume recording. COMPARISON:  None Available. FINDINGS: Right ABI:  0.75 Left ABI:  1.13 Right Lower Extremity:  Monophasic arterial waveforms at the ankle. Left Lower Extremity: Normal triphasic arterial waveforms at the ankle. IMPRESSION: 1. Moderately abnormal right ankle brachial index (0.75) with monophasic waveforms at the right ankle. 2. Normal left ankle brachial index (1.13). Ruthann Cancer, MD [facility] Radiology Electronically Signed   By: Ruthann Cancer M.D.   On: 06/13/2022 15:39   DG Toe Great Right  Result Date: 06/13/2022 CLINICAL DATA:  Infected right distal tuft great toe. Possible osteomyelitis. EXAM: RIGHT GREAT TOE COMPARISON:  None Available. FINDINGS: Mild degenerate change of the first MTP joint and first interphalangeal joint. Exam demonstrates subtle lucency and ill definition of the distal cortex/tuft of the first distal phalanx which could be seen due to osteomyelitis. Slight irregularity of the superficial soft tissues  in this area correlate to patient's soft tissue infection. Remainder of the exam is unremarkable.  IMPRESSION: Subtle lucency with ill definition of the distal cortex/tuft of the first distal phalanx which could be seen due to osteomyelitis. Electronically Signed   By: Marin Olp M.D.   On: 06/13/2022 08:37      Assessment/Plan 1. Osteomyelitis RIGHT great toe- s/p distal RIGHT great toe amputation today per Podiatry 2. Diabetic Foot Infection 3. PAD  Although the patients ABIs have not changed, he has a chronic RIGHT great toe wound and osteomyelitis. Recommend angiogram of RIGHT lower extremity this admission.  Discussed at length with daughter and patient. Planning per Dr. Delana Meyer.   Evaristo Bury, MD  06/14/2022 10:46 AM    This note was created with Dragon medical transcription system.  Any error is purely unintentional

## 2022-06-14 NOTE — Anesthesia Preprocedure Evaluation (Signed)
Anesthesia Evaluation  Patient identified by MRN, date of birth, ID band Patient awake    Reviewed: Allergy & Precautions, NPO status , Patient's Chart, lab work & pertinent test results  History of Anesthesia Complications Negative for: history of anesthetic complications  Airway Mallampati: III  TM Distance: >3 FB Neck ROM: Full    Dental no notable dental hx. (+) Missing,    Pulmonary neg pulmonary ROS, former smoker,    Pulmonary exam normal breath sounds clear to auscultation       Cardiovascular Exercise Tolerance: Good METS: < 3 Mets hypertension, (-) angina (Denies)+ CAD and + Peripheral Vascular Disease  Normal cardiovascular exam Rhythm:Regular Rate:Normal     Neuro/Psych PSYCHIATRIC DISORDERS Anxiety Depression negative neurological ROS     GI/Hepatic negative GI ROS, Neg liver ROS,   Endo/Other  negative endocrine ROSdiabetes  Renal/GU Renal diseasenegative Renal ROS  negative genitourinary   Musculoskeletal negative musculoskeletal ROS (+)   Abdominal   Peds  Hematology negative hematology ROS (+)   Anesthesia Other Findings Patient Name:  [name] Date of Exam: 03/19/2020  Medical Rec #: [id number]       Height:    72.0 in  Accession #:  [id number]      Weight:    200.0 lb  Date of Birth: [birth year]       BSA:     2.131 m  Patient Age:  76 years       BP:      163/81 mmHg  Patient Gender: M           HR:      78 bpm.  Exam Location: [facility]   Procedure: 2D Echo, Cardiac Doppler and Color Doppler   Indications:   Syncope 780.2    History:     Patient has prior history of Echocardiogram examinations,  most          recent 06/28/2016. Stroke; Risk Factors:Hypertension and          Diabetes.    Sonographer:   Sherrie Sport RDCS (AE)  Referring Phys: [id number] Athena Masse  Diagnosing Phys: Serafina Royals MD     Sonographer Comments: Suboptimal apical window.  IMPRESSIONS    1. Left ventricular ejection fraction, by estimation, is 55 to 60%. The  left ventricle has normal function. The left ventricle has no regional  wall motion abnormalities. Left ventricular diastolic parameters were  normal.  2. Right ventricular systolic function is normal. The right ventricular  size is mildly enlarged. There is normal pulmonary artery systolic  pressure.  3. Left atrial size was mildly dilated.  4. Right atrial size was mildly dilated.  5. The mitral valve is normal in structure. Mild to moderate mitral valve  regurgitation.  6. Tricuspid valve regurgitation is mild to moderate.  7. The aortic valve is normal in structure. Aortic valve regurgitation is  not visualized.   Reproductive/Obstetrics negative OB ROS                             Anesthesia Physical Anesthesia Plan  ASA: 3 and emergent  Anesthesia Plan: MAC   Post-op Pain Management:    Induction: Intravenous  PONV Risk Score and Plan:   Airway Management Planned: Natural Airway and Nasal Cannula  Additional Equipment:   Intra-op Plan:   Post-operative Plan:   Informed Consent: I have reviewed the patients History and Physical, chart, labs and discussed the procedure  including the risks, benefits and alternatives for the proposed anesthesia with the patient or authorized representative who has indicated his/her understanding and acceptance.     Dental Advisory Given  Plan Discussed with: Anesthesiologist, CRNA and Surgeon  Anesthesia Plan Comments: (Patient consented for risks of anesthesia including but not limited to:  - adverse reactions to medications - damage to eyes, teeth, lips or other oral mucosa - nerve damage due to positioning  - sore throat or hoarseness - Damage to heart, brain, nerves, lungs, other parts of body or loss of life  Patient voiced  understanding.)        Anesthesia Quick Evaluation

## 2022-06-14 NOTE — Anesthesia Postprocedure Evaluation (Signed)
Anesthesia Post Note  Patient: [name]  Procedure(s) Performed: RIGHT GREAT TOE AMPUTATION (Right: Toe)  Patient location during evaluation: PACU Anesthesia Type: MAC Level of consciousness: awake and alert Pain management: pain level controlled Vital Signs Assessment: post-procedure vital signs reviewed and stable Respiratory status: spontaneous breathing, nonlabored ventilation, respiratory function stable and patient connected to nasal cannula oxygen Cardiovascular status: stable and blood pressure returned to baseline Postop Assessment: no apparent nausea or vomiting Anesthetic complications: no   No notable events documented.   Last Vitals:  Vitals:   06/14/22 0830 06/14/22 0909  BP: 134/62 (!) 141/70  Pulse: 68 67  Resp: 17 15  Temp:  36.6 C  SpO2: 94% 94%    Last Pain:  Vitals:   06/14/22 0620  TempSrc:   PainSc: 0-No pain                 Tonny Bollman

## 2022-06-14 NOTE — Op Note (Signed)
Operative note   Surgeon:Trenton Passow Lawyer: None    Preop diagnosis: Osteomyelitis distal right great toe    Postop diagnosis: Same    Procedure: Amputation distal right great toe    EBL: Minimal    Anesthesia:local and IV sedation.  Local consisted of a total of 7 cc of a one-to-one mixture of 0.5% plain bupivacaine and 2% lidocaine plain    Hemostasis: None    Specimen: Bone for culture and distal great toe for pathology    Complications: None    Operative indications:[name] is an 76 y.o. that presents today for surgical intervention.  The risks/benefits/alternatives/complications have been discussed and consent has been given.    Procedure:  Patient was brought into the OR and placed on the operating table in thesupine position. After anesthesia was obtained theright lower extremity was prepped and draped in usual sterile fashion.  Attention was directed to the distal aspect of the right foot where a dorsal and plantar skin flap was performed at the level of the interphalangeal joint.  Full-thickness flaps were created.  The toe was disarticulated at the interphalangeal joint.  A small amount of the distal aspect of the proximal phalanx was then excised with a power saw.  The wound was flushed with copious amounts of irrigation.  Closure was performed with a combination of Vicryl and nylon.  A portion of bone was sent for culture with the remaining toe sent for pathology.  A bulky sterile dressing was applied.    Patient tolerated the procedure and anesthesia well.  Was transported from the OR to the PACU with all vital signs stable and vascular status intact. To be discharged per routine protocol.  Will follow up in approximately 1 week in the outpatient clinic.

## 2022-06-14 NOTE — Assessment & Plan Note (Signed)
-   Continue aspirin and Zocor

## 2022-06-14 NOTE — Progress Notes (Signed)
  Progress Note   Patient: [name] IWO:[id number] DOB: 12-07-1946 DOA: 06/13/2022     1 DOS: the patient was seen and examined on 06/14/2022   Brief hospital course: No notes on file  Assessment and Plan: * Osteomyelitis of great toe of right foot ([place]) S/p toe amputation on 6/18 Patient is not septic, no fever or leukocytosis.   Plan: --start IV vanc and cefepime for empiric coverage, per Dr. Vickki Muff --vascular consult for right LE angiogram   Type II diabetes mellitus with renal manifestations ([place]) Recent A1c 9.1, poorly controlled.   Patient is taking metformin, NovoLog, Trulicity and Lantus 90 units daily Plan: -Sliding scale insulin -Glargine insulin, 60 units daily  Chronic diastolic CHF (congestive heart failure) ([place]) 2D echo on 03/19/2020 showed EF of 55 to 60%.  Patient does not have leg edema or JVD.  BNP is normal at 92.5.  CHF is compensated. -Continue Lasix 80 mg daily  Coronary artery disease involving coronary bypass graft of native heart without angina pectoris S/p of CABG -Continue aspirin, Zocor  Chronic kidney disease, stage 3a (HCC) Stable -Follow-up by BMP  HTN (hypertension) - IV hydralazine as needed -Lasix, metoprolol  HLD (hyperlipidemia) - Zocor  BPH (benign prostatic hyperplasia) - Flomax and finasteride  Depression - Continue home medications  Atrial fibrillation, chronic (HCC) - Hold Eliquis for surgery -Continue metoprolol  History of CVA (cerebrovascular accident) - Continue aspirin and Zocor        Subjective:  Pt had toe amputation this morning.  Tolerated it well.  Pt said he was doing fine.   Physical Exam:  Constitutional: NAD, AAOx3 HEENT: conjunctivae and lids normal, EOMI CV: No cyanosis.   RESP: normal respiratory effort, on RA Extremities: right surgical boot on. SKIN: warm, dry Neuro: II - XII grossly intact.   Psych: Normal mood and affect.  Appropriate judgement and reason   Data  Reviewed:  Family Communication: daughter updated at bedside today  Disposition: Status is: Inpatient   Planned Discharge Destination: Home    Time spent: 50 minutes  Author: Enzo Bi, MD 06/14/2022 2:43 PM  For on call review [email or web address].

## 2022-06-14 NOTE — Transfer of Care (Signed)
Immediate Anesthesia Transfer of Care Note  Patient: [name]  Procedure(s) Performed: RIGHT GREAT TOE AMPUTATION (Right: Toe)  Patient Location: PACU  Anesthesia Type:MAC  Level of Consciousness: drowsy  Airway & Oxygen Therapy: Patient Spontanous Breathing  Post-op Assessment: Report given to RN  Post vital signs: stable  Last Vitals:  Vitals Value Taken Time  BP 136/67 06/14/22 0815  Temp 37   Pulse 69 06/14/22 0819  Resp 14 06/14/22 0819  SpO2 94 % 06/14/22 0819  Vitals shown include unvalidated device data.  Last Pain:  Vitals:   06/14/22 0620  TempSrc:   PainSc: 0-No pain         Complications: No notable events documented.

## 2022-06-14 NOTE — ED Notes (Signed)
Pt transported to OR at this time  

## 2022-06-14 NOTE — Consult Note (Signed)
Pharmacy Antibiotic Note  [name] is a 76 y.o. male admitted on 06/13/2022 with  osteomyelitis  of distal right great toe. S/p amputation on 06/14/22 . Surgical cultures pending. Pharmacy has been consulted for Vancomycin and cefepime dosing.  Plan: Initiate Cefepime 2 gram Q12H Vancomycin 2000 mg x 1 LD followed by Vancomycin 750 mg Q12H. Goal AUC 400-600 Estimated AUC 447/Cmin: 14.8 Scr 1.44, IBW, Vd 0.72 Ke 0.044, t1/2 15.9hr  Height: 6' (182.9 cm) Weight: 107 kg (235 lb 14.3 oz) IBW/kg (Calculated) : 77.6  Temp (24hrs), Avg:98 F (36.7 C), Min:97.4 F (36.3 C), Max:98.5 F (36.9 C)  Recent Labs  Lab 06/13/22 0747 06/14/22 0426  WBC 10.1 8.4  CREATININE 1.44*  --     Estimated Creatinine Clearance: 54.3 mL/min (A) (by C-G formula based on SCr of 1.44 mg/dL (H)).    No Known Allergies  Antimicrobials this admission: 6/18 cefepime >>  6/18 Vancomycin >>   Dose adjustments this admission:   Microbiology results: 6/17 BCx: NGTD 6/18 Surgical/bone culture: pending    Thank you for allowing pharmacy to be a part of this patient's care.  Dorothe Pea, PharmD, BCPS Clinical Pharmacist   06/14/2022 11:37 AM

## 2022-06-15 ENCOUNTER — Encounter: Payer: Self-pay | Admitting: Podiatry

## 2022-06-15 ENCOUNTER — Other Ambulatory Visit: Payer: Self-pay

## 2022-06-15 DIAGNOSIS — E1151 Type 2 diabetes mellitus with diabetic peripheral angiopathy without gangrene: Principal | ICD-10-CM

## 2022-06-15 DIAGNOSIS — I10 Essential (primary) hypertension: Secondary | ICD-10-CM

## 2022-06-15 DIAGNOSIS — I70235 Atherosclerosis of native arteries of right leg with ulceration of other part of foot: Secondary | ICD-10-CM

## 2022-06-15 DIAGNOSIS — M869 Osteomyelitis, unspecified: Secondary | ICD-10-CM | POA: Diagnosis not present

## 2022-06-15 DIAGNOSIS — I4891 Unspecified atrial fibrillation: Secondary | ICD-10-CM

## 2022-06-15 LAB — BASIC METABOLIC PANEL
Anion gap: 7 (ref 5–15)
BUN: 36 mg/dL — ABNORMAL HIGH (ref 8–23)
CO2: 25 mmol/L (ref 22–32)
Calcium: 9 mg/dL (ref 8.9–10.3)
Chloride: 109 mmol/L (ref 98–111)
Creatinine, Ser: 1.4 mg/dL — ABNORMAL HIGH (ref 0.61–1.24)
GFR, Estimated: 52 mL/min — ABNORMAL LOW (ref >60)
Glucose, Bld: 175 mg/dL — ABNORMAL HIGH (ref 70–99)
Potassium: 3.9 mmol/L (ref 3.5–5.1)
Sodium: 141 mmol/L (ref 135–145)

## 2022-06-15 LAB — CBC
HCT: 35.2 % — ABNORMAL LOW (ref 39.0–52.0)
Hemoglobin: 11.7 g/dL — ABNORMAL LOW (ref 13.0–17.0)
MCH: 29.9 pg (ref 26.0–34.0)
MCHC: 33.2 g/dL (ref 30.0–36.0)
MCV: 90 fL (ref 80.0–100.0)
Platelets: 186 10*3/uL (ref 150–400)
RBC: 3.91 MIL/uL — ABNORMAL LOW (ref 4.22–5.81)
RDW: 13.5 % (ref 11.5–15.5)
WBC: 8.4 10*3/uL (ref 4.0–10.5)
nRBC: 0 % (ref 0.0–0.2)

## 2022-06-15 LAB — MAGNESIUM: Magnesium: 2.5 mg/dL — ABNORMAL HIGH (ref 1.7–2.4)

## 2022-06-15 LAB — GLUCOSE, CAPILLARY
Glucose-Capillary: 182 mg/dL — ABNORMAL HIGH (ref 70–99)
Glucose-Capillary: 239 mg/dL — ABNORMAL HIGH (ref 70–99)
Glucose-Capillary: 277 mg/dL — ABNORMAL HIGH (ref 70–99)
Glucose-Capillary: 335 mg/dL — ABNORMAL HIGH (ref 70–99)

## 2022-06-15 MED ORDER — SODIUM CHLORIDE 0.9 % IV SOLN: INTRAVENOUS | Status: DC

## 2022-06-15 MED ORDER — INSULIN ASPART 100 UNIT/ML IJ SOLN
5.0000 [IU] | Freq: Three times a day (TID) | INTRAMUSCULAR | Status: DC
Start: 2022-06-15 — End: 2022-06-17
  Administered 2022-06-15 – 2022-06-17 (×4): 5 [IU] via SUBCUTANEOUS
  Filled 2022-06-15 (×4): qty 1

## 2022-06-15 MED ORDER — VANCOMYCIN HCL IN DEXTROSE 750-5 MG/150ML-% IV SOLN
750.0000 mg | Freq: Two times a day (BID) | INTRAVENOUS | Status: DC
  Administered 2022-06-15 – 2022-06-16 (×3): 750 mg via INTRAVENOUS
  Filled 2022-06-15 (×4): qty 150

## 2022-06-15 NOTE — Progress Notes (Signed)
[facility]  Daily Progress Note   Subjective  - 1 Day Post-Op right partial first ray amputation by Dr. Vickki Muff  Patient resting comfortably in bed denies right foot and leg pain.  Objective Vitals:   06/15/22 0356 06/15/22 0500 06/15/22 0812 06/15/22 1657  BP: (!) 159/80  (!) 149/86 124/67  Pulse: 64  71 76  Resp: '17  16 16  '$ Temp: 97.9 F (36.6 C)  (!) 97.4 F (36.3 C) (!) 97.3 F (36.3 C)  TempSrc:      SpO2: 92%  99% 96%  Weight:  105.4 kg    Height:        Intake/Output Summary (Last 24 hours) at 06/15/2022 1819 Last data filed at 06/15/2022 1600 Gross per 24 hour  Intake 496.88 ml  Output --  Net 496.88 ml    PULM  Normal effort , no use of accessory muscles CV  No JVD, RRR Abd      No distended, nontender VASC  right foot is dressed there is no evidence for cellulitic changes at the ankle.  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  This represents a limb threatening ischemia and places the patient at the risk for right limb loss.  Patient should undergo angiography of the right lower extremity  with the hope for intervention for limb salvage.  The risks and benefits as well as the alternative therapies was discussed in detail with the patient.  All questions were answered.  Patient agrees to proceed with right angiography.  The patient will follow up with me in the office after the procedure.    2.  Diabetes mellitus: Continue hypoglycemic medications as already ordered, these medications have been reviewed and there are no changes at this time.  Hgb A1C to be monitored as already arranged by primary service  3.  Hypertension:   Continue antihypertensive medications as already ordered, these medications have been reviewed and there are no changes at this time.  4.  Atrial fibrillation:  Continue antiarrhythmia medications as already ordered, these medications have been reviewed and there are no changes at this time.  Continue anticoagulation as ordered by Cardiology Service   [name]  06/15/2022, 6:19 PM

## 2022-06-15 NOTE — TOC Initial Note (Signed)
Transition of Care [facility]) - Initial/Assessment Note    Patient Details  Name: [name] MRN: [id number] Date of Birth: December 25, 1946  [facility] of [facility] CM/SW Contact:    Beverly Sessions, RN Phone Number: 06/15/2022, 2:59 PM  Clinical Narrative:                  Admitted for: Osteo. Great toe amputated Admitted from: home alone [facility] [place] Current home health/prior home health/DME:[facility] (4 wheels);[facility]asandra Knudsen - [facility]  Therapy agreeable to home health services.  States he does not have a preference of home health agency.  Referral made and accepted by [facility] with Alvis Lemmings.   Daughter at bedside.  She is visiting from [place].  She will be available to transport at discharge   Expected Discharge Plan: [place] Barriers to Discharge: Continued Medical Work up   Patient Goals and CMS Choice Patient states their goals for this hospitalization and ongoing recovery are:: to go home and continue PT CMS Medicare.gov Compare Post Acute Care list provided to:: Patient Choice offered to / list presented to : Patient  Expected Discharge Plan and Services Expected Discharge Plan: [place]   Discharge Planning Services: CM Consult Post Acute Care Choice: [place] arrangements for the past 2 months: Single Family Home                           HH Arranged: PT, OT HH Agency: [place] Date [facility] Agency Contacted: 06/15/22   Representative spoke with at [place]: Tommi Rumps  Prior Living Arrangements/Services Living arrangements for the past 2 months: [place] Lives with:: Self Patient language and need for interpreter reviewed:: Yes Do you feel safe going back to the place where you live?: Yes      Need for Family Participation in Patient Care: Yes (Comment) Care giver support system in place?: Yes (comment) Current home services: DME Criminal Activity/Legal Involvement Pertinent  to Current Situation/Hospitalization: No - Comment as needed  Activities of Daily Living Home Assistive Devices/Equipment: Environmental consultant (specify type) (rollator) ADL Screening (condition at time of admission) Patient's cognitive ability adequate to safely complete daily activities?: Yes Is the patient deaf or have difficulty hearing?: No Does the patient have difficulty seeing, even when wearing glasses/contacts?: No Does the patient have difficulty concentrating, remembering, or making decisions?: No Patient able to express need for assistance with ADLs?: Yes Does the patient have difficulty dressing or bathing?: No Independently performs ADLs?: Yes (appropriate for developmental age) Does the patient have difficulty walking or climbing stairs?: No Weakness of Legs: None Weakness of Arms/Hands: None  Permission Sought/Granted                  Emotional Assessment       Orientation: : Oriented to Self, Oriented to Place, Oriented to  Time, Oriented to Situation Alcohol / Substance Use: Not Applicable Psych Involvement: No (comment)  Admission diagnosis:  Open wound of right great toe, initial encounter [S91.101A] Other osteomyelitis of right foot ([place]) [M86.8X7] Osteomyelitis of great toe of right foot ([place]) [M86.9] Patient Active Problem List   Diagnosis Date Noted   Osteomyelitis of great toe of right foot ([place]) 06/13/2022   Chronic kidney disease, stage 3a ([place])    Chronic diastolic CHF (congestive heart failure) ([place])    Type II diabetes mellitus with renal manifestations ([place])    Depression  Atrial fibrillation, chronic (HCC)    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/12/2021   Chronic sinusitis 05/12/2021   Herpes zoster 05/12/2021   Hypersomnia with sleep apnea 05/12/2021   Impacted cerumen 05/12/2021   Long term (current) use of anticoagulants 05/12/2021   Other dysfunctions of sleep stages or arousal from sleep 05/12/2021   Myalgia  and myositis 05/12/2021   Posttraumatic stress disorder 05/12/2021   Presbyopia 05/12/2021   Psychosexual dysfunction with inhibited sexual excitement 05/12/2021   Type 2 diabetes mellitus with hyperglycemia ([place]) 05/12/2021   Atherosclerosis of native arteries of the extremities with ulceration ([place]) 01/01/2021   Postural dizziness with presyncope 03/18/2020   Hypoglycemia associated with type 2 diabetes mellitus ([place]) 03/18/2020   New onset atrial flutter ([place]) 03/18/2020   Hx of CABG 03/18/2020   History of CVA (cerebrovascular accident) 03/18/2020   Class 2 severe obesity due to excess calories with serious comorbidity and body mass index (BMI) of 35.0 to 35.9 in adult [facility]) 01/12/2020   Diabetic neuropathy ([place]) 01/04/2020   Occlusion and stenosis of vertebral artery 01/04/2020   Sciatica 01/04/2020   Health care maintenance 11/27/2019   Sleeping difficulty 09/13/2019   Ambulates with cane 05/25/2019   LVH (left ventricular hypertrophy) due to hypertensive disease, without heart failure 09/22/2018   Abnormal ECG 09/06/2018   Coronary artery disease involving coronary bypass graft of native heart without angina pectoris 11/12/2016   Stable angina ([place]) 10/13/2016   Acute CVA (cerebrovascular accident) ([place]) 06/29/2016   Elevated troponin 06/27/2016   HTN (hypertension) 06/27/2016   HLD (hyperlipidemia) 06/27/2016   Type 2 diabetes mellitus ([place]) 06/27/2016   BPH (benign prostatic hyperplasia) 06/27/2016   Extremity numbness 06/27/2016   AKI (acute kidney injury) ([place]) 06/27/2016   Vitamin D deficiency 03/30/2016   Vitamin B 12 deficiency 03/30/2016   At risk for psychosocial dysfunction 12/01/2015   PCP:  [name], MD Pharmacy:   [id number] [place] [id number]: [phone]: [phone]    Social Determinants of Health (SDOH) Interventions    Readmission Risk Interventions     No data to  display

## 2022-06-15 NOTE — Progress Notes (Signed)
PODIATRY / FOOT AND ANKLE SURGERY PROGRESS NOTE  Chief Complaint: Right toe   HPI: [name] is a 76 y.o. male who presents status post 1 day right partial hallux amputation with Dr. Vickki Muff.  Patient denies any pain at this time.  Patient is kept his dressings clean, dry, and intact since procedure and has been weightbearing as tolerated in the surgical shoe provided.  Patient currently denies nausea, vomiting, fevers, chills.  PMHx:  Past Medical History:  Diagnosis Date   Atrial fibrillation ([place])    BPH (benign prostatic hyperplasia)    Depression    Diabetes mellitus without complication ([place])    Diabetic foot ulcers ([place])    HLD (hyperlipidemia)    Hypertension    Neuropathy    PTSD (post-traumatic stress disorder)    Restless leg syndrome    Stroke [facility])    [place] in 2017 and mulitiple TIAs since per daughter    Surgical Hx:  Past Surgical History:  Procedure Laterality Date   AMPUTATION TOE Right 06/14/2022   Procedure: RIGHT GREAT TOE AMPUTATION;  Surgeon: Samara Deist, DPM;  Location: [facility] OR[facility];  Service: Podiatry;  Laterality: Right;   BYPASS GRAFT     CARDIAC CATHETERIZATION Left 10/28/2016   Procedure: Left Heart Cath and Coronary Angiography;  Surgeon: Corey Skains, MD;  Location: [place] [facility];  Service: Cardiovascular;  Laterality: Left;   CARDIOVERSION N/A 09/25/2020   Procedure: CARDIOVERSION;  Surgeon: Corey Skains, MD;  Location: [facility] ORS;  Service: Cardiovascular;  Laterality: N/A;   SHOULDER ARTHROSCOPY     UVULOPALATOPHARYNGOPLASTY, TONSILLECTOMY AND SEPTOPLASTY      FHx:  Family History  Problem Relation Age of Onset   Hypertension Other    Diabetes Mellitus II Other    Diabetes Mother    Diabetes Sister    Atrial fibrillation Sister    Atrial fibrillation Father    Atrial fibrillation Daughter     Social History:  reports that he has quit smoking. His smoking use included cigarettes. He has never used smokeless tobacco.  He reports that he does not drink alcohol and does not use drugs.  Allergies: No Known Allergies  Medications Prior to Admission  Medication Sig Dispense Refill   amoxicillin-clavulanate (AUGMENTIN) 875-125 MG tablet Take 1 tablet by mouth in the morning and at bedtime.     apixaban (ELIQUIS) 5 MG TABS tablet Take 1 tablet (5 mg total) by mouth 2 (two) times daily. 60 tablet 0   aspirin EC 81 MG tablet Take 81 mg by mouth daily. Swallow whole.     calcium carbonate (TUMS - DOSED IN MG ELEMENTAL CALCIUM) 500 MG chewable tablet Chew 1 tablet by mouth daily as needed for indigestion or heartburn.     cholecalciferol (VITAMIN D) 1000 UNITS tablet Take 1,000 Units by mouth daily.     docusate sodium (COLACE) 100 MG capsule Take 100 mg by mouth daily.     finasteride (PROSCAR) 5 MG tablet Take 5 mg by mouth daily.     furosemide (LASIX) 80 MG tablet Take 80 mg by mouth daily.     gabapentin (NEURONTIN) 100 MG capsule Take 100 mg by mouth 3 (three) times daily.     icosapent Ethyl (VASCEPA) 1 g capsule Take 2 g by mouth 2 (two) times daily.      insulin aspart (NOVOLOG) 100 UNIT/ML injection Inject 20 Units into the skin 3 (three) times daily before meals. (Patient taking differently: Inject 20-25 Units into the  skin See admin instructions. Inject 20 units before breakfast, 20 units before lunch, and 25 units before supper) 10 mL 0   insulin glargine (LANTUS) 100 UNIT/ML injection Inject 1 mL (100 Units total) into the skin at bedtime. (Patient taking differently: Inject 90 Units into the skin at bedtime.) 10 mL 0   metFORMIN (GLUCOPHAGE) 1000 MG tablet Take 1,000 mg by mouth 2 (two) times daily with a meal.      metoprolol tartrate (LOPRESSOR) 25 MG tablet Take 25 mg by mouth 2 (two) times daily.     nortriptyline (PAMELOR) 10 MG capsule Take 30 mg by mouth at bedtime.      PARoxetine (PAXIL) 40 MG tablet Take 60 mg by mouth daily.      rOPINIRole (REQUIP) 0.5 MG tablet Take 0.5 mg by mouth at  bedtime.     simvastatin (ZOCOR) 80 MG tablet Take 40 mg by mouth at bedtime.      tamsulosin (FLOMAX) 0.4 MG CAPS capsule Take 0.4 mg by mouth daily after lunch.      traMADol-acetaminophen (ULTRACET) 37.5-325 MG tablet Take 1 tablet by mouth every 8 (eight) hours as needed for moderate pain.     vitamin B-12 (CYANOCOBALAMIN) 1000 MCG tablet Take 1,000 mcg by mouth at bedtime.     Dulaglutide (TRULICITY) 1.5 QV/9.5GL SOPN Inject 1.5 mg into the skin every Sunday.  (Patient not taking: Reported on 06/13/2022)     Investigational - Study Medication Take 1 tablet by mouth daily. Study name: IDS study medication  Additional study details: [facility] in [place]     lisinopril (ZESTRIL) 40 MG tablet Take 40 mg by mouth daily.  (Patient not taking: Reported on 8/75/6433)     TRULICITY 4.5 IR/5.1OA SOPN Inject into the skin.      Physical Exam: General: Alert and oriented.  No apparent distress.  Vascular: DP/PT pulses faintly palpable bilateral.  Capillary fill time appears to be intact to all digits.  Appears to be slightly delayed to the right hallux amputation flap plantarly, but dorsally intact.  Neuro: Light touch sensation nearly absent to bilateral lower extremities.  Derm: Right hallux partial amputation site appears to be well coapted with sutures intact, mild edema, no erythema, capillary fill time appears to be delayed to the plantar aspect of the flap but intact dorsally, mild serosanguineous drainage/dried blood.    MSK: Right hallux partial amputation  Results for orders placed or performed during the hospital encounter of 06/13/22 (from the past 48 hour(s))  CBG monitoring, ED     Status: Abnormal   Collection Time: 06/13/22  1:58 PM  Result Value Ref Range   Glucose-Capillary 220 (H) 70 - 99 mg/dL    Comment: Glucose reference range applies only to samples taken after fasting for at least 8 hours.  CBG monitoring, ED     Status: Abnormal   Collection Time: 06/13/22  4:28  PM  Result Value Ref Range   Glucose-Capillary 232 (H) 70 - 99 mg/dL    Comment: Glucose reference range applies only to samples taken after fasting for at least 8 hours.  CBG monitoring, ED     Status: Abnormal   Collection Time: 06/13/22 11:17 PM  Result Value Ref Range   Glucose-Capillary 232 (H) 70 - 99 mg/dL    Comment: Glucose reference range applies only to samples taken after fasting for at least 8 hours.  CBC     Status: Abnormal   Collection Time: 06/14/22  4:26 AM  Result Value Ref Range   WBC 8.4 4.0 - 10.5 K/uL   RBC 4.01 (L) 4.22 - 5.81 MIL/uL   Hemoglobin 11.9 (L) 13.0 - 17.0 g/dL   HCT 36.6 (L) 39.0 - 52.0 %   MCV 91.3 80.0 - 100.0 fL   MCH 29.7 26.0 - 34.0 pg   MCHC 32.5 30.0 - 36.0 g/dL   RDW 13.3 11.5 - 15.5 %   Platelets 189 150 - 400 K/uL   nRBC 0.0 0.0 - 0.2 %    Comment: Performed at [facility], [address] [place], [place] [address]  CBG monitoring, ED     Status: Abnormal   Collection Time: 06/14/22  6:31 AM  Result Value Ref Range   Glucose-Capillary 229 (H) 70 - 99 mg/dL    Comment: Glucose reference range applies only to samples taken after fasting for at least 8 hours.  Aerobic/Anaerobic Culture w Gram Stain (surgical/deep wound)     Status: None (Preliminary result)   Collection Time: 06/14/22  7:51 AM   Specimen: PATH Bone biopsy; Tissue  Result Value Ref Range   Specimen Description      BONE Performed at [facility], [address] [place], [place] [address]    Special Requests      NONE Performed at [facility], [place], [place] [address]    Gram Stain      NO SQUAMOUS EPITHELIAL CELLS SEEN NO WBC SEEN NO ORGANISMS SEEN Performed at [facility], [place] [address], [place], [place] [address]    Culture PENDING    Report Status PENDING   CBG monitoring, ED     Status: Abnormal   Collection Time: 06/14/22  8:17 AM  Result Value Ref Range   Glucose-Capillary 230 (H) 70 - 99  mg/dL    Comment: Glucose reference range applies only to samples taken after fasting for at least 8 hours.  Glucose, capillary     Status: Abnormal   Collection Time: 06/14/22 12:04 PM  Result Value Ref Range   Glucose-Capillary 260 (H) 70 - 99 mg/dL    Comment: Glucose reference range applies only to samples taken after fasting for at least 8 hours.  Glucose, capillary     Status: Abnormal   Collection Time: 06/14/22  4:49 PM  Result Value Ref Range   Glucose-Capillary 292 (H) 70 - 99 mg/dL    Comment: Glucose reference range applies only to samples taken after fasting for at least 8 hours.  Glucose, capillary     Status: Abnormal   Collection Time: 06/14/22  9:18 PM  Result Value Ref Range   Glucose-Capillary 430 (H) 70 - 99 mg/dL    Comment: Glucose reference range applies only to samples taken after fasting for at least 8 hours.  Glucose, random     Status: Abnormal   Collection Time: 06/14/22  9:53 PM  Result Value Ref Range   Glucose, Bld 434 (H) 70 - 99 mg/dL    Comment: Glucose reference range applies only to samples taken after fasting for at least 8 hours. Performed at [facility], [address] [place], [place] [address]   Basic metabolic panel     Status: Abnormal   Collection Time: 06/15/22  4:48 AM  Result Value Ref Range   Sodium 141 135 - 145 mmol/L   Potassium 3.9 3.5 - 5.1 mmol/L   Chloride 109 98 - 111 mmol/L   CO2 25 22 - 32  mmol/L   Glucose, Bld 175 (H) 70 - 99 mg/dL    Comment: Glucose reference range applies only to samples taken after fasting for at least 8 hours.   BUN 36 (H) 8 - 23 mg/dL   Creatinine, Ser 1.40 (H) 0.61 - 1.24 mg/dL   Calcium 9.0 8.9 - 10.3 mg/dL   GFR, Estimated 52 (L) >60 mL/min    Comment: (NOTE) Calculated using the CKD-EPI Creatinine Equation (2021)    Anion gap 7 5 - 15    Comment: Performed at [facility], [address] [place], [place] [address]  CBC     Status: Abnormal   Collection Time: 06/15/22   4:48 AM  Result Value Ref Range   WBC 8.4 4.0 - 10.5 K/uL   RBC 3.91 (L) 4.22 - 5.81 MIL/uL   Hemoglobin 11.7 (L) 13.0 - 17.0 g/dL   HCT 35.2 (L) 39.0 - 52.0 %   MCV 90.0 80.0 - 100.0 fL   MCH 29.9 26.0 - 34.0 pg   MCHC 33.2 30.0 - 36.0 g/dL   RDW 13.5 11.5 - 15.5 %   Platelets 186 150 - 400 K/uL   nRBC 0.0 0.0 - 0.2 %    Comment: Performed at [facility], [address], [place], [place] [address]  Magnesium     Status: Abnormal   Collection Time: 06/15/22  4:48 AM  Result Value Ref Range   Magnesium 2.5 (H) 1.7 - 2.4 mg/dL    Comment: Performed at [facility], [address] [place], [place] [address]  Glucose, capillary     Status: Abnormal   Collection Time: 06/15/22  7:53 AM  Result Value Ref Range   Glucose-Capillary 182 (H) 70 - 99 mg/dL    Comment: Glucose reference range applies only to samples taken after fasting for at least 8 hours.  Glucose, capillary     Status: Abnormal   Collection Time: 06/15/22 11:22 AM  Result Value Ref Range   Glucose-Capillary 239 (H) 70 - 99 mg/dL    Comment: Glucose reference range applies only to samples taken after fasting for at least 8 hours.   US ARTERIAL ABI (SCREENING LOWER EXTREMITY)  Result Date: 06/13/2022 CLINICAL DATA:  76 year old male with right great toe wound. EXAM: NONINVASIVE PHYSIOLOGIC VASCULAR STUDY OF BILATERAL LOWER EXTREMITIES TECHNIQUE: Evaluation of both lower extremities were performed at rest, including calculation of ankle-brachial indices with single level Doppler, pressure and pulse volume recording. COMPARISON:  None Available. FINDINGS: Right ABI:  0.75 Left ABI:  1.13 Right Lower Extremity:  Monophasic arterial waveforms at the ankle. Left Lower Extremity: Normal triphasic arterial waveforms at the ankle. IMPRESSION: 1. Moderately abnormal right ankle brachial index (0.75) with monophasic waveforms at the right ankle. 2. Normal left ankle brachial index (1.13). Ruthann Cancer, MD [facility] Electronically Signed   By: Ruthann Cancer M.D.   On: 06/13/2022 15:39    Blood pressure (!) 149/86, pulse 71, temperature (!) 97.4 F (36.3 C), resp. rate 16, height 6' (1.829 m), weight 105.4 kg, SpO2 99 %.  Assessment Status post right hallux amputation partial due to osteomyelitis present Diabetes type 2 polyneuropathy PVD  Plan -Patient seen and examined -Incision site to the right hallux appears to be well coapted with sutures intact.  Slight capillary fill time reduction to the plantar flap around the incision site but dorsally appears intact. -Reapplied Xeroform today followed by 4 x 4 gauze, gauze roll, Ace wrap.  Patient to keep dressings clean, dry, and intact and weight-bear as tolerated in surgical shoe and try to put most pressure on heel. -Appreciate medicine recommendations for antibiotic therapy.  Believe amputation was curative of infection.  Can likely be discharged on 7 days of oral antibiotics based on culture report.  So far no growth.  If continues to be no growth would recommend staying on Augmentin upon discharge. -Appreciate vascular recommendations, planning for CT angiogram with intervention  Follow more peripherally but likely change patient's dressing 1 more time upon discharge to assess the plantar flap again.  Caroline More, DPM 06/15/2022, 12:22 PM

## 2022-06-15 NOTE — H&P (View-Only) (Signed)
[facility]  Daily Progress Note   Subjective  - 1 Day Post-Op right partial first ray amputation by Dr. Vickki Muff  Patient resting comfortably in bed denies right foot and leg pain.  Objective Vitals:   06/15/22 0356 06/15/22 0500 06/15/22 0812 06/15/22 1657  BP: (!) 159/80  (!) 149/86 124/67  Pulse: 64  71 76  Resp: '17  16 16  '$ Temp: 97.9 F (36.6 C)  (!) 97.4 F (36.3 C) (!) 97.3 F (36.3 C)  TempSrc:      SpO2: 92%  99% 96%  Weight:  105.4 kg    Height:        Intake/Output Summary (Last 24 hours) at 06/15/2022 1819 Last data filed at 06/15/2022 1600 Gross per 24 hour  Intake 496.88 ml  Output --  Net 496.88 ml    PULM  Normal effort , no use of accessory muscles CV  No JVD, RRR Abd      No distended, nontender VASC  right foot is dressed there is no evidence for cellulitic changes at the ankle.  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  This represents a limb threatening ischemia and places the patient at the risk for right limb loss.  Patient should undergo angiography of the right lower extremity  with the hope for intervention for limb salvage.  The risks and benefits as well as the alternative therapies was discussed in detail with the patient.  All questions were answered.  Patient agrees to proceed with right angiography.  The patient will follow up with me in the office after the procedure.    2.  Diabetes mellitus: Continue hypoglycemic medications as already ordered, these medications have been reviewed and there are no changes at this time.  Hgb A1C to be monitored as already arranged by primary service  3.  Hypertension:   Continue antihypertensive medications as already ordered, these medications have been reviewed and there are no changes at this time.  4.  Atrial fibrillation:  Continue antiarrhythmia medications as already ordered, these medications have been reviewed and there are no changes at this time.  Continue anticoagulation as ordered by Cardiology Service   [name]  06/15/2022, 6:19 PM

## 2022-06-15 NOTE — Evaluation (Signed)
Physical Therapy Evaluation Patient Details Name: [name] MRN: [id number] DOB: 01/24/1946 Today's Date: 06/15/2022  History of Present Illness  [name] is a 42yoM who comes to [facility] on 6/17 with progressing foot wound. Workup revealing of Rt hallux osteo s/p amputation with Dr. Vickki Muff podiatry 6/18.  Clinical Impression  Pt admitted with above diagnosis. Pt currently with functional limitations due to the deficits listed below (see "PT Problem List"). Upon entry, pt in recliner, awake and agreeable to participate. The pt is alert, pleasant, interactive, and able to provide info regarding prior level of function, both in tolerance and independence. Pt already has postop shoe on, author educates pt on use of RW and shoe for WBAT for transfers and gait. Pt performed transfers and AMB minguard assist, safe use of RW noted. with Patient's performance this date reveals decreased ability, independence, and tolerance in performing all basic mobility required for performance of activities of daily living. Pt requires additional DME, close physical assistance, and cues for safe participate in mobility. Pt will benefit from skilled PT intervention to increase independence and safety with basic mobility in preparation for discharge to the venue listed below.       Recommendations for follow up therapy are one component of a multi-disciplinary discharge planning process, led by the attending physician.  Recommendations may be updated based on patient status, additional functional criteria and insurance authorization.  Follow Up Recommendations Home health PT    Assistance Recommended at Discharge Set up Supervision/Assistance  Patient can return home with the following  Assistance with cooking/housework;Assist for transportation;A little help with bathing/dressing/bathroom    Equipment Recommendations None recommended by PT  Recommendations for Other Services       Functional Status  Assessment Patient has had a recent decline in their functional status and demonstrates the ability to make significant improvements in function in a reasonable and predictable amount of time.     Precautions / Restrictions Precautions Precautions: Fall Restrictions Weight Bearing Restrictions: Yes RLE Weight Bearing: Weight bearing as tolerated (in postop shoe)      Mobility  Bed Mobility               General bed mobility comments: already in recliner    Transfers Overall transfer level: Modified independent Equipment used: Rolling walker (2 wheels)               General transfer comment: good effort required, good problem solving, no asist needed, no LOB    Ambulation/Gait Ambulation/Gait assistance: Min guard Gait Distance (Feet): 100 Feet Assistive device: Rolling walker (2 wheels) Gait Pattern/deviations: Step-to pattern       General Gait Details: WBAT, post op shoe, pt using RW for off loading, pt attempting mostly heel weightbear  Stairs            Wheelchair Mobility    Modified Rankin (Stroke Patients Only)       Balance Overall balance assessment: Modified Independent, Mild deficits observed, not formally tested                                           Pertinent Vitals/Pain Pain Assessment Pain Assessment: No/denies pain    Home Living Family/patient expects to be discharged to:: Private residence Living Arrangements: Alone Available Help at Discharge: Family (Son lives nearby; DTR in town visiting) Type of Home: House Home Access: Level entry  Alternate Level Stairs-Number of Steps: live son main floor x 7 years Home Layout: Multi-level;Able to live on main level with bedroom/bathroom;1/2 bath on main level Home Equipment: Rollator (4 wheels);Cane - single point      Prior Function Prior Level of Function : Independent/Modified Independent                     Hand Dominance         Extremity/Trunk Assessment   Upper Extremity Assessment Upper Extremity Assessment: Overall WFL for tasks assessed    Lower Extremity Assessment Lower Extremity Assessment: Overall WFL for tasks assessed       Communication      Cognition Arousal/Alertness: Awake/alert Behavior During Therapy: WFL for tasks assessed/performed Overall Cognitive Status: Within Functional Limits for tasks assessed                                          General Comments      Exercises     Assessment/Plan    PT Assessment Patient needs continued PT services  PT Problem List Decreased strength;Decreased activity tolerance;Decreased balance;Decreased mobility;Decreased knowledge of use of DME;Decreased safety awareness;Decreased knowledge of precautions       PT Treatment Interventions DME instruction;Balance training;Gait training;Neuromuscular re-education;Functional mobility training;Therapeutic activities;Therapeutic exercise    PT Goals (Current goals can be found in the Care Plan section)  Acute Rehab PT Goals Patient Stated Goal: return to home with help from family PT Goal Formulation: With patient Time For Goal Achievement: 06/29/22 Potential to Achieve Goals: Fair    Frequency 7X/week     Co-evaluation               AM-PAC PT "6 Clicks" Mobility  Outcome Measure Help needed turning from your back to your side while in a flat bed without using bedrails?: A Little Help needed moving from lying on your back to sitting on the side of a flat bed without using bedrails?: A Little Help needed moving to and from a bed to a chair (including a wheelchair)?: A Little Help needed standing up from a chair using your arms (e.g., wheelchair or bedside chair)?: A Little Help needed to walk in hospital room?: A Little Help needed climbing 3-5 steps with a railing? : A Lot 6 Click Score: 17    End of Session Equipment Utilized During Treatment: Gait belt Activity  Tolerance: Patient tolerated treatment well;No increased pain Patient left: in chair;with family/visitor present;with call bell/phone within reach Nurse Communication: Mobility status PT Visit Diagnosis: Difficulty in walking, not elsewhere classified (R26.2);Other abnormalities of gait and mobility (R26.89)    Time: 3159-4585 PT Time Calculation (min) (ACUTE ONLY): 17 min   Charges:   PT Evaluation $PT Eval Moderate Complexity: 1 Mod PT Treatments $Gait Training: 8-22 mins       12:26 PM, 06/15/22 Etta Grandchild, PT, DPT Physical Therapist - [facility]  [phone] ([place])    Anthonio Mizzell C 06/15/2022, 12:24 PM

## 2022-06-15 NOTE — Progress Notes (Signed)
  Progress Note   Patient: [name] ZCH:[id number] DOB: [birth year] DOA: 06/13/2022     2 DOS: the patient was seen and examined on 06/15/2022   Brief hospital course: No notes on file  Assessment and Plan: * Osteomyelitis of great toe of right foot ([place]) S/p toe amputation on 6/18 Patient is not septic, no fever or leukocytosis.   --started IV vanc and cefepime after amputation for empiric coverage, per Dr. Vickki Muff --wound checked and dressing changed today. Plan: --vascular consult for right LE angiogram --keep dressings clean, dry, and intact  --weight-bear as tolerated in surgical shoe and try to put most pressure on heel. --podiatry to change patient's dressing 1 more time upon discharge to assess the plantar flap again. --d/c on 7 days of augmentin  Type II diabetes mellitus with renal manifestations (HCC) Recent A1c 9.1, poorly controlled.   Patient is taking metformin, NovoLog, Trulicity and Lantus 90 units daily Plan: -Sliding scale insulin -Glargine insulin, 60 units daily --add mealtime 5u TID  Chronic diastolic CHF (congestive heart failure) ([place]) 2D echo on 03/19/2020 showed EF of 55 to 60%.  Patient does not have leg edema or JVD.  BNP is normal at 92.5.  CHF is compensated. 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  Had wound check and dressing change with  podiatry today.   Physical Exam:  Constitutional: NAD, AAOx3 HEENT: conjunctivae and lids normal, EOMI CV: No cyanosis.   RESP: normal respiratory effort, on RA Neuro: II - XII grossly intact.   Psych: Normal mood and affect.  Appropriate judgement and reason    Data Reviewed:  Family Communication:  Disposition: Status is: Inpatient   Planned Discharge Destination: Home    Time spent: 35 minutes  Author: Enzo Bi, MD 06/15/2022 7:17 PM  For on call review [email or web address].

## 2022-06-15 NOTE — Inpatient Diabetes Management (Signed)
Inpatient Diabetes Program Recommendations  AACE/ADA: New Consensus Statement on Inpatient Glycemic Control   Target Ranges:  Prepandial:   less than 140 mg/dL      Peak postprandial:   less than 180 mg/dL (1-2 hours)      Critically ill patients:  140 - 180 mg/dL    Latest Reference Range & Units 06/14/22 08:17 06/14/22 12:04 06/14/22 16:49 06/14/22 21:18 06/15/22 07:53  Glucose-Capillary 70 - 99 mg/dL 230 (H) 260 (H) 292 (H) 430 (H) 182 (H)   Review of Glycemic Control  Diabetes history: DM2 Outpatient Diabetes medications: Lantus 60 units QHS, Novolog 20 units with breakfast, 20 units with lunch, 25 units with supper, Metformin 2620 mg BID, Trulicity 4.5 mg Qweek Current orders for Inpatient glycemic control: Semglee 60 units QHS, Novolog 0-9 units TID with meals, Novolog 0-5 units QHS  Inpatient Diabetes Program Recommendations:    Insulin: Please consider ordering Novolog 5 units TID with meals for meal coverage if patient eats at least 50% of meals.  NOTE: In reviewing chart, noted patient seen [name] (Endocrinologist) on 02/04/22 and per office note patient was advised to "Continue Lantus 90 units qhs. Continue metformin 1000 mg bid. Continue Trulicity 4.5 mg weekly. Continue NovoLog 30 units before breakfast and lunch and 35 units before supper." Also noted telephone note on 05/18/22 where patient called and request to talk with [name] regarding DM and missed appointments. "A final decision has been made by [name] that he cannot have any future scheduled appt's because of his history of no shows and that this decision will not be adjusted."   Thanks, Barnie Alderman, RN, MSN, [place] [facility] Coordinator Inpatient Diabetes Program [phone] (Team Pager from 8am to [place])

## 2022-06-16 ENCOUNTER — Encounter
Admission: EM | Disposition: A | Discharge status: Home Health | Payer: Self-pay | Source: Home / Self Care | Attending: Hospitalist

## 2022-06-16 DIAGNOSIS — M869 Osteomyelitis, unspecified: Secondary | ICD-10-CM | POA: Diagnosis not present

## 2022-06-16 HISTORY — PX: LOWER EXTREMITY ANGIOGRAPHY: CATH118251

## 2022-06-16 LAB — BASIC METABOLIC PANEL
Anion gap: 6 (ref 5–15)
BUN: 31 mg/dL — ABNORMAL HIGH (ref 8–23)
CO2: 25 mmol/L (ref 22–32)
Calcium: 8.9 mg/dL (ref 8.9–10.3)
Chloride: 108 mmol/L (ref 98–111)
Creatinine, Ser: 1.25 mg/dL — ABNORMAL HIGH (ref 0.61–1.24)
GFR, Estimated: 59 mL/min — ABNORMAL LOW (ref >60)
Glucose, Bld: 250 mg/dL — ABNORMAL HIGH (ref 70–99)
Potassium: 4 mmol/L (ref 3.5–5.1)
Sodium: 139 mmol/L (ref 135–145)

## 2022-06-16 LAB — MAGNESIUM: Magnesium: 2.5 mg/dL — ABNORMAL HIGH (ref 1.7–2.4)

## 2022-06-16 LAB — CBC
HCT: 35.8 % — ABNORMAL LOW (ref 39.0–52.0)
Hemoglobin: 11.8 g/dL — ABNORMAL LOW (ref 13.0–17.0)
MCH: 30.3 pg (ref 26.0–34.0)
MCHC: 33 g/dL (ref 30.0–36.0)
MCV: 91.8 fL (ref 80.0–100.0)
Platelets: 197 10*3/uL (ref 150–400)
RBC: 3.9 MIL/uL — ABNORMAL LOW (ref 4.22–5.81)
RDW: 13.6 % (ref 11.5–15.5)
WBC: 9.9 10*3/uL (ref 4.0–10.5)
nRBC: 0 % (ref 0.0–0.2)

## 2022-06-16 LAB — GLUCOSE, CAPILLARY
Glucose-Capillary: 224 mg/dL — ABNORMAL HIGH (ref 70–99)
Glucose-Capillary: 218 mg/dL — ABNORMAL HIGH (ref 70–99)
Glucose-Capillary: 194 mg/dL — ABNORMAL HIGH (ref 70–99)
Glucose-Capillary: 250 mg/dL — ABNORMAL HIGH (ref 70–99)

## 2022-06-16 LAB — SURGICAL PATHOLOGY

## 2022-06-16 SURGERY — LOWER EXTREMITY ANGIOGRAPHY
Anesthesia: Moderate Sedation | Laterality: Right

## 2022-06-16 MED ORDER — CLOPIDOGREL BISULFATE 75 MG PO TABS
300.0000 mg | ORAL_TABLET | ORAL | Status: AC
  Administered 2022-06-16: 300 mg via ORAL
  Filled 2022-06-16: qty 4

## 2022-06-16 MED ORDER — MIDAZOLAM HCL 2 MG/2ML IJ SOLN
INTRAMUSCULAR | Status: AC
  Filled 2022-06-16: qty 4

## 2022-06-16 MED ORDER — DIPHENHYDRAMINE HCL 50 MG/ML IJ SOLN
INTRAMUSCULAR | Status: AC
  Filled 2022-06-16: qty 1

## 2022-06-16 MED ORDER — DIPHENHYDRAMINE HCL 50 MG/ML IJ SOLN
INTRAMUSCULAR | Status: DC | PRN
  Administered 2022-06-16: 25 mg via INTRAVENOUS

## 2022-06-16 MED ORDER — HEPARIN SODIUM (PORCINE) 1000 UNIT/ML IJ SOLN
INTRAMUSCULAR | Status: AC
  Filled 2022-06-16: qty 10

## 2022-06-16 MED ORDER — MIDAZOLAM HCL 2 MG/2ML IJ SOLN
INTRAMUSCULAR | Status: DC | PRN
  Administered 2022-06-16 (×2): 1 mg via INTRAVENOUS
  Administered 2022-06-16: 2 mg via INTRAVENOUS

## 2022-06-16 MED ORDER — CLOPIDOGREL BISULFATE 75 MG PO TABS
75.0000 mg | ORAL_TABLET | Freq: Every day | ORAL | Status: DC
  Administered 2022-06-17: 75 mg via ORAL
  Filled 2022-06-16: qty 1

## 2022-06-16 MED ORDER — CEPHALEXIN 500 MG PO CAPS
500.0000 mg | ORAL_CAPSULE | Freq: Four times a day (QID) | ORAL | Status: DC
  Administered 2022-06-16 – 2022-06-17 (×4): 500 mg via ORAL
  Filled 2022-06-16 (×5): qty 1

## 2022-06-16 MED ORDER — HEPARIN SODIUM (PORCINE) 1000 UNIT/ML IJ SOLN
INTRAMUSCULAR | Status: DC | PRN
  Administered 2022-06-16: 5000 [IU] via INTRAVENOUS

## 2022-06-16 MED ORDER — LABETALOL HCL 5 MG/ML IV SOLN
INTRAVENOUS | Status: DC | PRN
  Administered 2022-06-16: 10 mg via INTRAVENOUS

## 2022-06-16 MED ORDER — SODIUM CHLORIDE 0.9 % IV SOLN
250.0000 mL | INTRAVENOUS | Status: DC | PRN
Start: 2022-06-16 — End: 2022-06-17

## 2022-06-16 MED ORDER — SODIUM CHLORIDE 0.9% FLUSH
3.0000 mL | Freq: Two times a day (BID) | INTRAVENOUS | Status: DC
  Administered 2022-06-16: 3 mL via INTRAVENOUS

## 2022-06-16 MED ORDER — MORPHINE SULFATE (PF) 4 MG/ML IV SOLN: 2.0000 mg | INTRAVENOUS | Status: DC | PRN

## 2022-06-16 MED ORDER — SODIUM CHLORIDE 0.9 % IV SOLN: INTRAVENOUS | Status: AC

## 2022-06-16 MED ORDER — VANCOMYCIN HCL IN DEXTROSE 1-5 GM/200ML-% IV SOLN
1000.0000 mg | Freq: Two times a day (BID) | INTRAVENOUS | Status: DC
Start: 2022-06-17 — End: 2022-06-16
  Filled 2022-06-16: qty 200

## 2022-06-16 MED ORDER — OXYCODONE HCL 5 MG PO TABS: 5.0000 mg | ORAL_TABLET | ORAL | Status: DC | PRN

## 2022-06-16 MED ORDER — SODIUM CHLORIDE 0.9% FLUSH
3.0000 mL | INTRAVENOUS | Status: DC | PRN
Start: 2022-06-16 — End: 2022-06-17

## 2022-06-16 MED ORDER — CEFEPIME HCL 2 G IV SOLR
2.0000 g | Freq: Three times a day (TID) | INTRAVENOUS | Status: DC
Start: 2022-06-16 — End: 2022-06-16
  Filled 2022-06-16 (×4): qty 12.5

## 2022-06-16 MED ORDER — FENTANYL CITRATE (PF) 100 MCG/2ML IJ SOLN
INTRAMUSCULAR | Status: DC | PRN
  Administered 2022-06-16: 50 ug via INTRAVENOUS
  Administered 2022-06-16 (×2): 25 ug via INTRAVENOUS

## 2022-06-16 MED ORDER — LABETALOL HCL 5 MG/ML IV SOLN
INTRAVENOUS | Status: AC
  Filled 2022-06-16: qty 4

## 2022-06-16 MED ORDER — ONDANSETRON HCL 4 MG/2ML IJ SOLN: 4.0000 mg | Freq: Four times a day (QID) | INTRAMUSCULAR | Status: DC | PRN

## 2022-06-16 MED ORDER — FENTANYL CITRATE PF 50 MCG/ML IJ SOSY
PREFILLED_SYRINGE | INTRAMUSCULAR | Status: AC
  Filled 2022-06-16: qty 2

## 2022-06-16 MED ORDER — IODIXANOL 320 MG/ML IV SOLN
INTRAVENOUS | Status: DC | PRN
  Administered 2022-06-16: 70 mL

## 2022-06-16 SURGICAL SUPPLY — 26 items
BALLN LUTONIX 018 4X80X130 (BALLOONS) ×2
BALLN ULTRASCORE 014 3X200X150 (BALLOONS) ×2
BALLN ULTRVRSE 2X150X150 (BALLOONS) ×2
BALLOON LUTONIX 018 4X80X130 (BALLOONS) IMPLANT
BALLOON ULTRSCRE 014 3X200X150 (BALLOONS) IMPLANT
BALLOON ULTRVRSE 2X150X150 (BALLOONS) IMPLANT
CATH ANGIO 5F PIGTAIL 65CM (CATHETERS) ×1 IMPLANT
CATH NAVICROSS ANGLED 135CM (MICROCATHETER) ×1 IMPLANT
CATH VERT 5FR 125CM (CATHETERS) ×1 IMPLANT
COVER DRAPE FLUORO 36X44 (DRAPES) ×1 IMPLANT
COVER PROBE U/S 5X48 (MISCELLANEOUS) ×1 IMPLANT
DEVICE SAFEGUARD 24CM (GAUZE/BANDAGES/DRESSINGS) ×1 IMPLANT
DEVICE STARCLOSE SE CLOSURE (Vascular Products) ×1 IMPLANT
DEVICE TORQUE (MISCELLANEOUS) ×1 IMPLANT
GLIDEWIRE ADV .014X300CM (WIRE) ×1 IMPLANT
GLIDEWIRE ADV .035X260CM (WIRE) ×1 IMPLANT
KIT ENCORE 26 ADVANTAGE (KITS) ×1 IMPLANT
NDL ENTRY 21GA 7CM ECHOTIP (NEEDLE) IMPLANT
NEEDLE ENTRY 21GA 7CM ECHOTIP (NEEDLE) ×2 IMPLANT
PACK ANGIOGRAPHY (CUSTOM PROCEDURE TRAY) ×2 IMPLANT
SET INTRO CAPELLA COAXIAL (SET/KITS/TRAYS/PACK) ×1 IMPLANT
SHEATH BRITE TIP 5FRX11 (SHEATH) ×1 IMPLANT
SHEATH RAABE 6FRX70 (SHEATH) ×1 IMPLANT
SYR MEDRAD MARK 7 150ML (SYRINGE) ×1 IMPLANT
TUBING CONTRAST HIGH PRESS 72 (TUBING) ×1 IMPLANT
WIRE GUIDERIGHT .035X150 (WIRE) ×1 IMPLANT

## 2022-06-16 NOTE — Progress Notes (Signed)
Night of surgery patient check  S: Patient denies foot pain.  No groin related complaints.  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  Patient is now status post angiography with revascularization via the peroneal artery.  He now has single-vessel runoff.  P: At this point I would assess his wound healing.  He does have the possibility of also incorporating the posterior tibial artery into the revascularization but this would take retrograde access from a pedal stick and given the near constant movement it would also require general anesthesia.  I discussed this in detail with the patient and his daughter.  Also reviewed the revascularization with the patient and his daughter.  All questions been answered.  At this point we will assess his healing potential and we will make a final decision regarding further revascularization based on that.

## 2022-06-16 NOTE — Progress Notes (Incomplete)
       CROSS COVER NOTE  NAME: [name] MRN: [id number] DOB : 02/09/1946    Date of Service   06/16/22  HPI/Events of Note   Hypertensive despite PRN IV hydralazine Did not receive PO lasix today while NPO  Interventions   Plan: Lasix 80 mg PO X X      This document was prepared using Dragon voice recognition software and may include unintentional dictation errors.  Neomia Glass DNP, MHA, FNP-BC Nurse Practitioner [facility] Pager [phone]

## 2022-06-16 NOTE — Op Note (Signed)
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  Introduction catheter into right lower extremity 3rd order catheter placement             2.  Contrast injection right lower extremity for distal runoff with additional 3rd order             3.  Percutaneous transluminal angioplasty right peroneal                          4.  Star close closure left common femoral arteriotomy  Anesthesia: Conscious sedation was administered under my direct supervision by the interventional radiology RN. IV Versed plus fentanyl were utilized. Continuous ECG, pulse oximetry and blood pressure was monitored throughout the entire procedure.  Conscious sedation was for a total of 1 hour 17 minutes.  Sheath: 6 [place] Rabie sheath left common femoral artery retrograde  Contrast: 70 cc  Fluoroscopy Time: 9 minutes  Indications:  [name] presents with increasing pain of the right lower extremity.  He was noted to have infection of the right first ray with evidence for osteomyelitis and underwent partial ray resection.  Given his atherosclerotic occlusive disease and his ulceration this suggests the patient is having limb threatening ischemia. The risks and benefits are reviewed all questions answered patient agrees to proceed.  Procedure: [name] is a 76 y.o. y.o. male who was identified and appropriate procedural time out was performed.  The patient was then placed supine on the table and prepped and draped in the usual sterile fashion.    Ultrasound was placed in the sterile sleeve and the left groin was evaluated the left common femoral  artery was echolucent and pulsatile indicating patency.  Image was recorded for the permanent record and under real-time visualization a microneedle was inserted into the common femoral artery followed by the microwire and then the micro-sheath.  A J-wire was then advanced through the micro-sheath and a  5 [place] sheath was then inserted over a J-wire. J-wire was then advanced and a 5 French pigtail catheter was positioned at the level of T12.  AP projection of the aorta was then obtained. Pigtail catheter was repositioned to above the bifurcation and a LAO view of the pelvis was obtained.  Subsequently a pigtail catheter with the stiff angle Glidewire was used to cross the aortic bifurcation the catheter wire were advanced down into the right distal external iliac artery. Oblique view of the femoral bifurcation was then obtained and subsequently the wire was reintroduced and the pigtail catheter negotiated into the SFA representing third order catheter placement. Distal runoff was then performed.  5000 units of heparin was then given and allowed to circulate and a 6 [place] Rabie sheath was advanced up and over the bifurcation and positioned in the femoral artery  Vertebral catheter and advantage Glidewire were then negotiated down into the distal popliteal. Hand injection contrast demonstrated the trifurcation and proximal tibial anatomy in detail.  Initially I selected the anterior tibial with the advantage wire and then advanced the vertebral catheter.  Hand-injection contrast demonstrated the anterior tibial occludes approximately 4 to 5 cm  after its origin.  It does reconstitute just above the ankle level.  Attempts at crossing this occlusion with the wire were not successful and I elected to move forward with revascularization of the peroneal and possibly the posterior tibial arteries.  The detector was then repositioned and the distal popliteal was re-imaged.  The distal half of the tibial peroneal  trunk as well as the proximal to mid peroneal and proximal to mid posterior tibial arteries were all occluded.  There is reconstitution of the mid peroneal and mid posterior tibial arteries which are then patent to the foot filling the pedal arch.  Switching to a Ferd Hibbs cross catheter with the advantage wire I was able to cross the tibioperoneal trunk as well as the occluded peroneal.  Hand-injection contrast through the [facility] cross catheter demonstrated intra luminal positioning.  A 2 mm x 150 mm Ultraverse balloon was then advanced across the lesion and inflated to 10 atm for 1 minute.  Next, a 3 mm x 200 mm ultra score balloon was advanced across the peroneal lesion and inflated to 10 atm for 1 minute.  Finally, a 4 mm x 60 mm Lutonix drug-eluting balloon was advanced across the tibioperoneal trunk and origin of the peroneal.  2 separate inflations to 4 atm each were made each inflation was for 1 minute.  Follow-up imaging demonstrated patency with excellent result and less than 10% residual stenosis throughout the tibioperoneal trunk and peroneal. Distal runoff was then reassessed and noted to be patent.    After review of these images the sheath is pulled into the left external iliac oblique of the common femoral is obtained and a Star close device deployed. There no immediate Complications.  Findings:  The abdominal aorta is opacified with a bolus injection contrast. Renal arteries are patent bilaterally. The aorta itself has diffuse disease but no hemodynamically significant lesions. The common and external iliac arteries are widely patent bilaterally.  The right common femoral is widely patent as is the profunda femoris.  The SFA and popliteal arteries demonstrate diffuse disease but there are no hemodynamically significant lesions..  The trifurcation is diseased with occlusion of the anterior tibial several centimeters distal to its origin and then reconstitution at the ankle level, the tibioperoneal  trunk occludes in its midportion and the peroneal and posterior tibial arteries are occluded to their mid portions as well.   There is reconstitution of both the peroneal as well as the posterior tibial in the mid portions and they both are widely patent distally filling the pedal arch.  Following angioplasty the tibioperoneal trunk and peroneal is now widely patent with less than 10% residual stenosis and demonstrates in-line flow down to the foot.  The origin of the posterior tibial is obscure and I do not believe that revascularization will be successful from an antegrade approach.  Should wound healing issues remain then I would plan for revascularization of the posterior tibial but in a retrograde direction from the ankle up this would likely be in combination with an antegrade approach to protect the peroneal.  Also this would require anesthesia as he exhibited a tremendous amount of movement throughout the entire procedure in spite of adequate sedation  Summary: Successful recanalization right lower extremity for limb salvage                           Disposition: Patient was taken to the recovery room in stable condition having tolerated the procedure well.  [name] 06/16/2022,3:12 PM

## 2022-06-16 NOTE — Care Management Important Message (Signed)
Important Message  Patient Details  Name: [name] MRN: [id number] Date of Birth: 30-Oct-1946   Medicare Important Message Given:  N/A - LOS <3 / Initial given by admissions     [name] 06/16/2022, 8:59 AM

## 2022-06-16 NOTE — Inpatient Diabetes Management (Signed)
Inpatient Diabetes Program Recommendations  AACE/ADA: New Consensus Statement on Inpatient Glycemic Control (2015)  Target Ranges:  Prepandial:   less than 140 mg/dL      Peak postprandial:   less than 180 mg/dL (1-2 hours)      Critically ill patients:  140 - 180 mg/dL    Latest Reference Range & Units 06/16/22 03:26  Glucose 70 - 99 mg/dL 250 (H)    Latest Reference Range & Units 06/15/22 07:53 06/15/22 11:22 06/15/22 16:55 06/15/22 21:06  Glucose-Capillary 70 - 99 mg/dL 182 (H) 239 (H) 277 (H) 335 (H)   Review of Glycemic Control  Diabetes history: DM2 Outpatient Diabetes medications: Lantus 60 units QHS, Novolog 20 units with breakfast, 20 units with lunch, 25 units with supper, Metformin 2595 mg BID, Trulicity 4.5 mg Qweek Current orders for Inpatient glycemic control: Semglee 60 units QHS, Novolog 0-9 units TID with meals, Novolog 0-5 units QHS, Novolog 5 units TID with meals   Inpatient Diabetes Program Recommendations:     Insulin: Please consider increasing Semglee to 62 units QHS and meal coverage to Novolog 9 units TID with meals if patient eats at least 50% of meals.  Thanks, Barnie Alderman, RN, MSN, [place] Diabetes Coordinator Inpatient Diabetes Program [phone] (Team Pager from 8am to [place])

## 2022-06-16 NOTE — Interval H&P Note (Signed)
History and Physical Interval Note:  06/16/2022 1:33 PM  [name]  has presented today for surgery, with the diagnosis of atherosclerosis with ulceration.  The various methods of treatment have been discussed with the patient and family. After consideration of risks, benefits and other options for treatment, the patient has consented to  Procedure(s): Lower Extremity Angiography (Right) as a surgical intervention.  The patient's history has been reviewed, patient examined, no change in status, stable for surgery.  I have reviewed the patient's chart and labs.  Questions were answered to the patient's satisfaction.     [name]

## 2022-06-16 NOTE — Progress Notes (Signed)
Physical Therapy Treatment Patient Details Name: [name] MRN: [id number] DOB: [birth year] Today's Date: 06/16/2022   History of Present Illness [name] is a 33yoM who comes to [facility] on 6/17 with progressing foot wound. Workup revealing of Rt hallux osteo s/p amputation with Dr. Vickki Muff podiatry 6/18. Pt has baseline dizziness that causes him to take breaks frequently.    PT Comments    Pt in chair on arrival, agreeable to session. Pt unable to independently don his post op shoe, needs a lot of instruction and demonstration to achieve, will need to revisit this and/or involve caregivers with education. Pt is NPO at present as he is going to vascular surgery this afternoon. Pt able to STS 5x from recliner without issue, safe use of RW with cues, no LOB. Pain remains well controlled. Pt AMB to in room with good use of RW for off loading surgical foot.    Recommendations for follow up therapy are one component of a multi-disciplinary discharge planning process, led by the attending physician.  Recommendations may be updated based on patient status, additional functional criteria and insurance authorization.  Follow Up Recommendations  Home health PT     Assistance Recommended at Discharge Set up Supervision/Assistance  Patient can return home with the following Assistance with cooking/housework;Assist for transportation;A little help with bathing/dressing/bathroom   Equipment Recommendations  None recommended by PT    Recommendations for Other Services       Precautions / Restrictions Precautions Precautions: Fall Restrictions Weight Bearing Restrictions: Yes RLE Weight Bearing: Weight bearing as tolerated     Mobility  Bed Mobility               General bed mobility comments: already in recliner    Transfers Overall transfer level: Modified independent Equipment used: Rolling walker (2 wheels)               General transfer comment: good effort  required, good problem solving, no asist needed, no LOB    Ambulation/Gait Ambulation/Gait assistance: Min guard Gait Distance (Feet): 50 Feet Assistive device: Rolling walker (2 wheels) Gait Pattern/deviations: Step-to pattern       General Gait Details: WBAT, post op shoe, pt using RW for off loading, pt attempting mostly heel weightbear   Stairs             Wheelchair Mobility    Modified Rankin (Stroke Patients Only)       Balance Overall balance assessment: Modified Independent, Mild deficits observed, not formally tested                                          Cognition Arousal/Alertness: Awake/alert Behavior During Therapy: WFL for tasks assessed/performed Overall Cognitive Status: Within Functional Limits for tasks assessed                                 General Comments: some memory blips        Exercises Other Exercises Other Exercises: education on donning postop shoe Other Exercises: STS from recliner/RW x5 for technique practice    General Comments        Pertinent Vitals/Pain Pain Assessment Pain Assessment: No/denies pain    Home Living  Prior Function            PT Goals (current goals can now be found in the care plan section) Acute Rehab PT Goals Patient Stated Goal: return to home with help from family PT Goal Formulation: With patient Time For Goal Achievement: 06/29/22 Potential to Achieve Goals: Fair Progress towards PT goals: Progressing toward goals    Frequency    7X/week      PT Plan Current plan remains appropriate    Co-evaluation              AM-PAC PT "6 Clicks" Mobility   Outcome Measure  Help needed turning from your back to your side while in a flat bed without using bedrails?: A Little Help needed moving from lying on your back to sitting on the side of a flat bed without using bedrails?: A Little Help needed moving to and  from a bed to a chair (including a wheelchair)?: A Little Help needed standing up from a chair using your arms (e.g., wheelchair or bedside chair)?: A Little Help needed to walk in hospital room?: A Little Help needed climbing 3-5 steps with a railing? : A Lot 6 Click Score: 17    End of Session Equipment Utilized During Treatment: Gait belt Activity Tolerance: Patient tolerated treatment well;No increased pain Patient left: in chair;with family/visitor present;with call bell/phone within reach Nurse Communication: Mobility status PT Visit Diagnosis: Difficulty in walking, not elsewhere classified (R26.2);Other abnormalities of gait and mobility (R26.89)     Time: 4825-0037 PT Time Calculation (min) (ACUTE ONLY): 23 min  Charges:  $Gait Training: 8-22 mins $Therapeutic Exercise: 8-22 mins                    9:44 AM, 06/16/22 Etta Grandchild, PT, DPT Physical Therapist - [facility]  [phone] ([place])     Martice Doty C 06/16/2022, 9:41 AM

## 2022-06-16 NOTE — Consult Note (Signed)
Pharmacy Antibiotic Note  [name] is a 76 y.o. male admitted on 06/13/2022 with  osteomyelitis  of distal right great toe. S/p amputation on 06/14/22 . Surgical cultures pending. Pharmacy has been consulted for Vancomycin and Cefepime dosing.  Plan: Scr 1.4>1.25; adjusted accordingly. Podiatry following, assessed to be w/o dehiscence or infection & plan for PO abx x5d at discharge (Keflex). Initiate Cefepime 2 gram Q12H > Cefepime 2g Q8H Adjust Vancomycin '750mg'$  q12h > 1000 mg Q12H.  Goal AUC 400-600 Estimated AUC 546; Cmax 30.2 Cmin: 17.5 Scr 1.25, IBW, Vd 0.72 Ke 0.049, t1/2 14hr  Height: 6' (182.9 cm) Weight: 102.8 kg (226 lb 10.1 oz) IBW/kg (Calculated) : 77.6  Temp (24hrs), Avg:98.1 F (36.7 C), Min:97.3 F (36.3 C), Max:99.3 F (37.4 C)  Recent Labs  Lab 06/13/22 0747 06/14/22 0426 06/15/22 0448 06/16/22 0326  WBC 10.1 8.4 8.4 9.9  CREATININE 1.44*  --  1.40* 1.25*     Estimated Creatinine Clearance: 61.4 mL/min (A) (by C-G formula based on SCr of 1.25 mg/dL (H)).    No Known Allergies  Antimicrobials this admission: 6/18 Cefepime >>  6/18 Vancomycin >>   Dose adjustments this admission: Adjusted per improved Scr 1.4>1.25  Microbiology results: 6/17 BCx: NGTD 6/18 Surgical/bone culture: pending    Thank you for allowing pharmacy to be a part of this patient's care.  Lorna Dibble, PharmD, [facility] Clinical Pharmacist 06/16/2022 1:32 PM

## 2022-06-16 NOTE — Progress Notes (Addendum)
1251 Report given to [place]. Consent and Vanc ABX in chart   1310 Pt being transported to vascular at this time   1620 Pt transferred back to room after angiography. Dressing to L groin CDI pt has feeling to BLE. Cap refill greater than 3 seconds. L foot cool to touch. VSS pt lying flat until 430pm and can stand and ambulate again at 5pm per Lowanda Foster. Daughter at bedside   1748 Pt tolerating sitting up and ambulating no complications. Nurse will continue to monitor. Dressing remains CDI Call bell and possessions within reach

## 2022-06-16 NOTE — Progress Notes (Signed)
Daily Progress Note   Subjective  - 2 Days Post-Op  Follow-up partial great toe amputation.  He is awaiting angio today.  Doing well.  Objective Vitals:   06/16/22 0309 06/16/22 0500 06/16/22 0745 06/16/22 1224  BP: (!) 162/73  (!) 174/71 (!) 172/76  Pulse: 64  66 69  Resp: '17  16 17  '$ Temp: 98 F (36.7 C)  97.8 F (36.6 C) 98.1 F (36.7 C)  TempSrc:      SpO2: 94%  93% 100%  Weight:  102.8 kg    Height:        Physical Exam: Dressing changed today.  Incisions well coapted.  Good cap fill time to the skin flaps.  Mild bruising dorsally.  No signs of dehiscence or infection.  Laboratory CBC    Component Value Date/Time   WBC 9.9 06/16/2022 0326   HGB 11.8 (L) 06/16/2022 0326   HCT 35.8 (L) 06/16/2022 0326   PLT 197 06/16/2022 0326    BMET    Component Value Date/Time   NA 139 06/16/2022 0326   K 4.0 06/16/2022 0326   CL 108 06/16/2022 0326   CO2 25 06/16/2022 0326   GLUCOSE 250 (H) 06/16/2022 0326   BUN 31 (H) 06/16/2022 0326   CREATININE 1.25 (H) 06/16/2022 0326   CALCIUM 8.9 06/16/2022 0326   GFRNONAA 59 (L) 06/16/2022 0326   GFRAA >60 03/19/2020 0550    Assessment/Planning: Status post great toe amputation for osteomyelitis  Awaiting angio at this time. Surgical site is well-maintained.  New dressing applied today.  No new dressing changes needed until follow-up in the outpatient clinic with myself.  Patient can follow-up next Wednesday in medicine clinic.  Please schedule upon discharge. Recommend oral antibiotics for 5 days postdischarge.  Keflex should suffice. From podiatry standpoint patient is stable for discharge. Weight-bear on heel in postoperative shoe.  [name]  06/16/2022, 12:37 PM

## 2022-06-16 NOTE — Interval H&P Note (Signed)
History and Physical Interval Note:  06/16/2022 1:41 PM  [name]  has presented today for surgery, with the diagnosis of atherosclerosis with ulceration.  The various methods of treatment have been discussed with the patient and family. After consideration of risks, benefits and other options for treatment, the patient has consented to  Procedure(s): Lower Extremity Angiography (Right) as a surgical intervention.  The patient's history has been reviewed, patient examined, no change in status, stable for surgery.  I have reviewed the patient's chart and labs.  Questions were answered to the patient's satisfaction.     [name]

## 2022-06-16 NOTE — Progress Notes (Signed)
  Progress Note   Patient: [name] GQQ:[id number] DOB: Mar 24, 1946 DOA: 06/13/2022     3 DOS: the patient was seen and examined on 06/16/2022   Brief hospital course: No notes on file  Assessment and Plan: * Osteomyelitis of great toe of right foot ([place]) S/p toe amputation on 6/18 Patient is not septic, no fever or leukocytosis.   --started IV vanc and cefepime after amputation for empiric coverage, per Dr. Vickki Muff --wound checked and dressing changed today. Plan: --right LE angiogram today --keep dressings clean, dry, and intact  --weight-bear as tolerated in surgical shoe and try to put most pressure on heel. --podiatry to change patient's dressing 1 more time upon discharge to assess the plantar flap again. --transition to Keflex today  Type II diabetes mellitus with renal manifestations ([place]) Recent A1c 9.1, poorly controlled.   Patient is taking metformin, NovoLog, Trulicity and Lantus 90 units daily Plan: -Sliding scale insulin -Glargine insulin, 60 units daily --mealtime 5u TID  Chronic diastolic CHF (congestive heart failure) ([place]) 2D echo on 03/19/2020 showed EF of 55 to 60%.  Patient does not have leg edema or JVD.  BNP is normal at 92.5.  CHF is compensated. 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  Tolerated it well.   Physical  Exam:  Constitutional: NAD, AAOx3 HEENT: conjunctivae and lids normal, EOMI CV: No cyanosis.   RESP: normal respiratory effort, on RA Extremities: right foot in ACE wrap SKIN: warm, dry Neuro: II - XII grossly intact.   Psych: Normal mood and affect.  Appropriate judgement and reason   Data Reviewed:  Family Communication: daughter updated at bedside today Disposition: Status is: Inpatient   Planned Discharge Destination: Home    Time spent: 35 minutes  Author: Enzo Bi, MD 06/16/2022 7:04 PM  For on call review [email or web address].

## 2022-06-17 ENCOUNTER — Encounter: Payer: Self-pay | Admitting: Vascular Surgery

## 2022-06-17 DIAGNOSIS — M869 Osteomyelitis, unspecified: Secondary | ICD-10-CM | POA: Diagnosis not present

## 2022-06-17 LAB — CBC
HCT: 34.2 % — ABNORMAL LOW (ref 39.0–52.0)
Hemoglobin: 11.2 g/dL — ABNORMAL LOW (ref 13.0–17.0)
MCH: 29.6 pg (ref 26.0–34.0)
MCHC: 32.7 g/dL (ref 30.0–36.0)
MCV: 90.2 fL (ref 80.0–100.0)
Platelets: 183 10*3/uL (ref 150–400)
RBC: 3.79 MIL/uL — ABNORMAL LOW (ref 4.22–5.81)
RDW: 13.6 % (ref 11.5–15.5)
WBC: 9.3 10*3/uL (ref 4.0–10.5)
nRBC: 0 % (ref 0.0–0.2)

## 2022-06-17 LAB — BASIC METABOLIC PANEL
Anion gap: 6 (ref 5–15)
BUN: 19 mg/dL (ref 8–23)
CO2: 26 mmol/L (ref 22–32)
Calcium: 9 mg/dL (ref 8.9–10.3)
Chloride: 109 mmol/L (ref 98–111)
Creatinine, Ser: 1.18 mg/dL (ref 0.61–1.24)
GFR, Estimated: >60 mL/min (ref >60)
Glucose, Bld: 187 mg/dL — ABNORMAL HIGH (ref 70–99)
Potassium: 4 mmol/L (ref 3.5–5.1)
Sodium: 141 mmol/L (ref 135–145)

## 2022-06-17 LAB — GLUCOSE, CAPILLARY
Glucose-Capillary: 141 mg/dL — ABNORMAL HIGH (ref 70–99)
Glucose-Capillary: 152 mg/dL — ABNORMAL HIGH (ref 70–99)

## 2022-06-17 LAB — MAGNESIUM: Magnesium: 2.5 mg/dL — ABNORMAL HIGH (ref 1.7–2.4)

## 2022-06-17 MED ORDER — INSULIN GLARGINE 100 UNIT/ML ~~LOC~~ SOLN: 90.0000 [IU] | Freq: Every day | SUBCUTANEOUS | Status: DC

## 2022-06-17 MED ORDER — ORAL CARE MOUTH RINSE: 15.0000 mL | OROMUCOSAL | Status: DC | PRN

## 2022-06-17 MED ORDER — CLOPIDOGREL BISULFATE 75 MG PO TABS: 75.0000 mg | ORAL_TABLET | Freq: Every day | ORAL | 0 refills | Status: AC

## 2022-06-17 NOTE — Progress Notes (Signed)
Physical Therapy Treatment Patient Details Name: [name] MRN: [id number] DOB: March 30, 1946 Today's Date: 06/17/2022   History of Present Illness [name] is a 28yoM who comes to [facility] on 6/17 with progressing foot wound. Workup revealing of Rt hallux osteo s/p amputation with Dr. Vickki Muff podiatry 6/18. Pt has baseline dizziness that causes him to take breaks frequently.    PT Comments    Pt up to chair, no pain today. DTR arrives, is educated on postop restrictions and use of postop shoe. Pt's level of independence and tolerance for transfers and AMB with RW is unchanged compared to previous 2 days, despite going for angioplasty previous day. Pt moving well in general. Plan is continue with frequent but short mobility in home and work with HHPT on access to upper level for bathing.    Recommendations for follow up therapy are one component of a multi-disciplinary discharge planning process, led by the attending physician.  Recommendations may be updated based on patient status, additional functional criteria and insurance authorization.  Follow Up Recommendations  Home health PT     Assistance Recommended at Discharge Set up Supervision/Assistance  Patient can return home with the following     Equipment Recommendations  None recommended by PT    Recommendations for Other Services       Precautions / Restrictions Precautions Precautions: Fall Restrictions Weight Bearing Restrictions: Yes RLE Weight Bearing: Weight bearing as tolerated Other Position/Activity Restrictions: postop shoe, heel weightbearing as able     Mobility  Bed Mobility               General bed mobility comments: in recliner    Transfers Overall transfer level: Needs assistance Equipment used: Rolling walker (2 wheels) Transfers: Sit to/from Stand Sit to Stand: Supervision           General transfer comment: shoe donned rt    Ambulation/Gait Ambulation/Gait assistance:  Min guard, Supervision Gait Distance (Feet): 100 Feet Assistive device: Rolling walker (2 wheels) Gait Pattern/deviations: WFL(Within Functional Limits)       General Gait Details: slow, steady, safe; DTR attending   Stairs Stairs:  (defer to [place])           Wheelchair Mobility    Modified Rankin (Stroke Patients Only)       Balance                                            Cognition Arousal/Alertness: Awake/alert Behavior During Therapy: WFL for tasks assessed/performed Overall Cognitive Status: Within Functional Limits for tasks assessed                                          Exercises Other Exercises Other Exercises: education on shoe donning, doffing to DTR    General Comments        Pertinent Vitals/Pain Pain Assessment Pain Assessment: No/denies pain    Home Living                          Prior Function            PT Goals (current goals can now be found in the care plan section) Progress towards PT goals: Progressing toward goals    Frequency  Min 2X/week      PT Plan Current plan remains appropriate    Co-evaluation              AM-PAC PT "6 Clicks" Mobility   Outcome Measure  Help needed turning from your back to your side while in a flat bed without using bedrails?: A Little Help needed moving from lying on your back to sitting on the side of a flat bed without using bedrails?: A Little Help needed moving to and from a bed to a chair (including a wheelchair)?: A Little Help needed standing up from a chair using your arms (e.g., wheelchair or bedside chair)?: A Little Help needed to walk in hospital room?: A Little Help needed climbing 3-5 steps with a railing? : A Little 6 Click Score: 18    End of Session Equipment Utilized During Treatment: Gait belt;Other (comment) (POS) Activity Tolerance: Patient tolerated treatment well;No increased pain Patient left: in  chair;with call bell/phone within reach;with family/visitor present Nurse Communication: Mobility status PT Visit Diagnosis: Difficulty in walking, not elsewhere classified (R26.2);Other abnormalities of gait and mobility (R26.89);Unsteadiness on feet (R26.81);Muscle weakness (generalized) (M62.81)     Time: 6387-5643 PT Time Calculation (min) (ACUTE ONLY): 14 min  Charges:  $Gait Training: 8-22 mins                    11:53 AM, 06/17/22 Etta Grandchild, PT, DPT Physical Therapist - [facility]  [phone] ([place])      [name] 06/17/2022, 11:51 AM

## 2022-06-17 NOTE — Discharge Summary (Signed)
Physician Discharge Summary   [name]  male DOB: 1946-07-09  XTG:[id number]  PCP: [name], MD  Admit date: 06/13/2022 Discharge date: 06/17/2022  Admitted From: home Disposition:  home Home Health: Yes CODE STATUS: Full code  Discharge Instructions     Discharge instructions   Complete by: As directed    For empiric treatment of your toe wound, please finish taking the Augmentin you have at home.  Leave the dressing on until you see Dr. Vickki Muff on 06/24/22.    Dr. Delana Meyer started you on plavix, and since you are already taking Eliquis, please stop taking aspirin while you are on Plavix.    Your blood pressure has been high in the hospital, 150's-180's.  Please check blood pressure at home.  Since your PCP took you off Lisinopril for a reason, please follow up with PCP to determine need to resume it or some other blood pressure medication.   Dr. Enzo Bi - -   No wound care   Complete by: As directed       Hospital Course:  For full details, please see H&P, progress notes, consult notes and ancillary notes.  Briefly,  [name] is a 76 y.o. male with medical history significant of hypertension, hyperlipidemia, diabetes mellitus, stroke, depression, BPH, atrial fibrillation on Eliquis, CAD, CABG, tobacco abuse, dCHF, who presented with right great toe infection.  * Osteomyelitis of great toe of right foot ([place]) S/p toe amputation on 6/18 Patient is not septic, no fever or leukocytosis.   --started IV vanc and cefepime after amputation for empiric coverage, per Dr. Vickki Muff, and discharged on augmentin for 5 days (Pt already has this Rx filled at home PTA). --pt had RLE angiogram and received revascularization via the peroneal artery.  He now has single-vessel runoff. --wound checked and dressing changed by podiatry prior to discharge. --weight-bear as tolerated in surgical shoe and try to put most pressure on heel. --f/u with podiatry  Dr. 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  He now has single-vessel runoff. --pt was started on plavix 75 mg daily by Dr. Delana Meyer.  Due to also being on Eliquis, home ASA 81 d/c'ed. --outpatient f/u with Vascular Dr. Delana Meyer  Type II diabetes mellitus with renal manifestations ([place]) Recent A1c 9.1, poorly controlled.   Patient is taking metformin, NovoLog, Trulicity and Lantus 90 units daily.  Discharged on home regimen.  Chronic diastolic CHF (congestive heart failure) ([place]) 2D echo on 03/19/2020 showed EF of 55 to 60%.  Patient does not have leg edema or JVD.  BNP is normal at 92.5.  CHF is compensated. 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  Per pt, he was taken off Lisinopril for a reason, so pt was advised to follow up with PCP to determine need to resume it or some other blood pressure medication.   HLD (hyperlipidemia) - Zocor   BPH (benign prostatic hyperplasia) - Flomax and finasteride   Depression - Continue home medications   Atrial fibrillation, chronic (HCC) - Eliquis held for surgery, resumed after discharge. -Continue metoprolol   History of CVA (cerebrovascular accident) -  Continue Zocor --d/c home ASA 81 mg daily since Vascular Surgery started pt on plavix 75 mg daily.   Discharge Diagnoses:  Principal Problem:   Osteomyelitis of great toe of right foot (HCC) Active Problems:   Type II diabetes mellitus with renal manifestations (HCC)   Chronic diastolic CHF (congestive heart failure) (HCC)   Coronary artery disease involving coronary bypass  graft of native heart without angina pectoris   Chronic kidney disease, stage 3a (HCC)   HTN (hypertension)   HLD (hyperlipidemia)   BPH (benign prostatic hyperplasia)   Depression   History of CVA (cerebrovascular accident)   Atrial fibrillation, chronic ([place])   30 Day Unplanned Readmission Risk Score    Flowsheet Row ED to Hosp-Admission (Current) from 06/13/2022 in [place] (1A)  30 Day Unplanned Readmission Risk Score (%) 16.69 Filed at 06/17/2022 0801       This score is the patient's risk of an unplanned readmission within 30 days of being discharged (0 -100%). The score is based on dignosis, age, lab data, medications, orders, and past utilization.   Low:  0-14.9   Medium: 15-21.9   High: 22-29.9   Extreme: 30 and above         Discharge Instructions:  Allergies as of 06/17/2022   No Known Allergies      Medication List     STOP taking these medications    aspirin EC 81 MG tablet   lisinopril 40 MG tablet Commonly known as: ZESTRIL   traMADol-acetaminophen 37.5-325 MG tablet Commonly known as: ULTRACET       TAKE these medications    amoxicillin-clavulanate 875-125 MG tablet Commonly known as: AUGMENTIN Take 1 tablet by mouth in the morning and at bedtime.   apixaban 5 MG Tabs tablet Commonly known as: ELIQUIS Take 1 tablet (5 mg total) by mouth 2 (two) times daily.   calcium carbonate 500 MG chewable tablet Commonly known as: TUMS - dosed in mg elemental calcium Chew 1 tablet by mouth daily as needed for indigestion or heartburn.   cholecalciferol 1000 units tablet Commonly known as: VITAMIN D Take 1,000 Units by mouth daily.   clopidogrel 75 MG tablet Commonly known as: PLAVIX Take 1 tablet (75 mg total) by mouth daily with breakfast.   docusate sodium 100 MG capsule Commonly known as: COLACE Take 100 mg by mouth daily.   finasteride 5 MG tablet Commonly known as: PROSCAR Take 5 mg by mouth daily.    furosemide 80 MG tablet Commonly known as: LASIX Take 80 mg by mouth daily.   gabapentin 100 MG capsule Commonly known as: NEURONTIN Take 100 mg by mouth 3 (three) times daily.   icosapent Ethyl 1 g capsule Commonly known as: VASCEPA Take 2 g by mouth 2 (two) times daily.   insulin aspart 100 UNIT/ML injection Commonly known as: novoLOG Inject 20 Units into the skin 3 (three) times daily before meals. What changed:  how much to take when to take this additional instructions   insulin glargine 100 UNIT/ML injection Commonly known as: Lantus Inject 0.9 mLs (90 Units total) into the skin at bedtime. Home med. What changed:  how much to take additional instructions   Investigational - Study Medication Take 1 tablet by mouth daily. Study name: IDS study medication  Additional study details: [facility] in [place]   metFORMIN 1000 MG tablet Commonly known as: GLUCOPHAGE Take 1,000 mg by mouth 2 (two) times daily with a meal.   metoprolol tartrate  25 MG tablet Commonly known as: LOPRESSOR Take 25 mg by mouth 2 (two) times daily.   nortriptyline 10 MG capsule Commonly known as: PAMELOR Take 30 mg by mouth at bedtime.   PARoxetine 40 MG tablet Commonly known as: PAXIL Take 60 mg by mouth daily.   rOPINIRole 0.5 MG tablet Commonly known as: REQUIP Take 0.5 mg by mouth at bedtime.   simvastatin 80 MG tablet Commonly known as: ZOCOR Take 40 mg by mouth at bedtime.   tamsulosin 0.4 MG Caps capsule Commonly known as: FLOMAX Take 0.4 mg by mouth daily after lunch.   Trulicity 4.5 CX/4.4YJ Sopn Generic drug: Dulaglutide Inject into the skin. What changed: Another medication with the same name was removed. Continue taking this medication, and follow the directions you see here.   vitamin B-12 1000 MCG tablet Commonly known as: CYANOCOBALAMIN Take 1,000 mcg by mouth at bedtime.         Follow-up Information     Samara Deist, DPM. Go on 06/24/2022.    Specialty: Podiatry Why: @ 930am Contact information: [place] [address] [phone]         Schnier, Dolores Lory, MD. Go on 07/09/2022.   Specialties: Vascular Surgery, Cardiology, Radiology, Vascular Surgery Why: '@3pm'$ , follow up after procedure with an ABI Contact information: [place] [address] [phone]         [name], MD. Go on 06/24/2022.   Specialty: Internal Medicine Why: @ 1015am Contact information: [place] [address] [phone]                 No Known Allergies   The results of significant diagnostics from this hospitalization (including imaging, microbiology, ancillary and laboratory) are listed below for reference.   Consultations:   Procedures/Studies: PERIPHERAL VASCULAR CATHETERIZATION  Result Date: 06/16/2022 See surgical note for result.  US ARTERIAL ABI (SCREENING LOWER EXTREMITY)  Result Date: 06/13/2022 CLINICAL DATA:  76 year old male with right great toe wound. EXAM: NONINVASIVE PHYSIOLOGIC VASCULAR STUDY OF BILATERAL LOWER EXTREMITIES TECHNIQUE: Evaluation of both lower extremities were performed at rest, including calculation of ankle-brachial indices with single level Doppler, pressure and pulse volume recording. COMPARISON:  None Available. FINDINGS: Right ABI:  0.75 Left ABI:  1.13 Right Lower Extremity:  Monophasic arterial waveforms at the ankle. Left Lower Extremity: Normal triphasic arterial waveforms at the ankle. IMPRESSION: 1. Moderately abnormal right ankle brachial index (0.75) with monophasic waveforms at the right ankle. 2. Normal left ankle brachial index (1.13). Ruthann Cancer, MD [facility] Radiology Electronically Signed   By: Ruthann Cancer M.D.   On: 06/13/2022 15:39   DG Toe Great Right  Result Date: 06/13/2022 CLINICAL DATA:   Infected right distal tuft great toe. Possible osteomyelitis. EXAM: RIGHT GREAT TOE COMPARISON:  None Available. FINDINGS: Mild degenerate change of the first MTP joint and first interphalangeal joint. Exam demonstrates subtle lucency and ill definition of the distal cortex/tuft of the first distal phalanx which could be seen due to osteomyelitis. Slight irregularity of the superficial soft tissues in this area correlate to patient's soft tissue infection. Remainder of the exam is unremarkable. IMPRESSION: Subtle lucency with ill definition of the distal cortex/tuft of the first distal phalanx which could be seen due to osteomyelitis. Electronically Signed   By: Marin Olp M.D.   On: 06/13/2022 08:37      Labs: BNP (  last 3 results) Recent Labs    06/13/22 1005  BNP 84.1   Basic Metabolic Panel: Recent Labs  Lab 06/13/22 0747 06/14/22 2153 06/15/22 0448 06/16/22 0326 06/17/22 0341  NA 138  --  141 139 141  K 5.0  --  3.9 4.0 4.0  CL 103  --  109 108 109  CO2 25  --  '25 25 26  '$ GLUCOSE 278* 434* 175* 250* 187*  BUN 31*  --  36* 31* 19  CREATININE 1.44*  --  1.40* 1.25* 1.18  CALCIUM 9.1  --  9.0 8.9 9.0  MG  --   --  2.5* 2.5* 2.5*   Liver Function Tests: Recent Labs  Lab 06/13/22 0747  AST 20  ALT 19  ALKPHOS 63  BILITOT 0.5  PROT 7.1  ALBUMIN 3.8   No results for input(s): "LIPASE", "AMYLASE" in the last 168 hours. No results for input(s): "AMMONIA" in the last 168 hours. CBC: Recent Labs  Lab 06/13/22 0747 06/14/22 0426 06/15/22 0448 06/16/22 0326 06/17/22 0341  WBC 10.1 8.4 8.4 9.9 9.3  NEUTROABS 6.8  --   --   --   --   HGB 12.3* 11.9* 11.7* 11.8* 11.2*  HCT 38.4* 36.6* 35.2* 35.8* 34.2*  MCV 92.5 91.3 90.0 91.8 90.2  PLT 213 189 186 197 183   Cardiac Enzymes: No results for input(s): "CKTOTAL", "CKMB", "CKMBINDEX", "TROPONINI" in the last 168 hours. BNP: Invalid input(s): "POCBNP" CBG: Recent Labs  Lab 06/16/22 1226 06/16/22 1602 06/16/22 2046  06/17/22 0801 06/17/22 0834  GLUCAP 218* 194* 250* 141* 152*   D-Dimer No results for input(s): "DDIMER" in the last 72 hours. Hgb A1c No results for input(s): "HGBA1C" in the last 72 hours. Lipid Profile No results for input(s): "CHOL", "HDL", "LDLCALC", "TRIG", "CHOLHDL", "LDLDIRECT" in the last 72 hours. Thyroid function studies No results for input(s): "TSH", "T4TOTAL", "T3FREE", "THYROIDAB" in the last 72 hours.  Invalid input(s): "FREET3" Anemia work up No results for input(s): "VITAMINB12", "FOLATE", "FERRITIN", "TIBC", "IRON", "RETICCTPCT" in the last 72 hours. Urinalysis    Component Value Date/Time   COLORURINE YELLOW (A) 03/18/2020 1759   APPEARANCEUR CLEAR (A) 03/18/2020 1759   LABSPEC 1.010 03/18/2020 1759   PHURINE 6.0 03/18/2020 1759   GLUCOSEU NEGATIVE 03/18/2020 1759   HGBUR NEGATIVE 03/18/2020 1759   BILIRUBINUR NEGATIVE 03/18/2020 1759   KETONESUR NEGATIVE 03/18/2020 1759   PROTEINUR 100 (A) 03/18/2020 1759   NITRITE NEGATIVE 03/18/2020 1759   LEUKOCYTESUR NEGATIVE 03/18/2020 1759   Sepsis Labs Recent Labs  Lab 06/14/22 0426 06/15/22 0448 06/16/22 0326 06/17/22 0341  WBC 8.4 8.4 9.9 9.3   Microbiology Recent Results (from the past 240 hour(s))  Culture, blood (Routine X 2) w Reflex to ID Panel     Status: None (Preliminary result)   Collection Time: 06/13/22 10:05 AM   Specimen: BLOOD  Result Value Ref Range Status   Specimen Description BLOOD LEFT ANTECUBITAL  Final   Special Requests   Final    BOTTLES DRAWN AEROBIC AND ANAEROBIC Blood Culture results may not be optimal due to an inadequate volume of blood received in culture bottles   Culture   Final    NO GROWTH 4 DAYS Performed at [facility], [address] [place], [place] [address]    Report Status PENDING  Incomplete  Culture, blood (Routine X 2) w Reflex to ID Panel     Status: None (Preliminary result)   Collection Time: 06/13/22 10:05 AM  Specimen: BLOOD  Result  Value Ref Range Status   Specimen Description BLOOD LEFT ANTECUBITAL  Final   Special Requests   Final    BOTTLES DRAWN AEROBIC AND ANAEROBIC Blood Culture results may not be optimal due to an inadequate volume of blood received in culture bottles   Culture   Final    NO GROWTH 4 DAYS Performed at [facility], [address] [place], [place] [address]    Report Status PENDING  Incomplete  Aerobic/Anaerobic Culture w Gram Stain (surgical/deep wound)     Status: None (Preliminary result)   Collection Time: 06/14/22  7:51 AM   Specimen: PATH Bone biopsy; Tissue  Result Value Ref Range Status   Specimen Description   Final    BONE Performed at [facility], [address], [place], [place] [address]    Special Requests   Final    NONE Performed at [facility], [address] [place], [place] [address]    Gram Stain   Final    NO SQUAMOUS EPITHELIAL CELLS SEEN NO WBC SEEN NO ORGANISMS SEEN    Culture   Final    NO GROWTH 3 DAYS NO ANAEROBES ISOLATED; CULTURE IN PROGRESS FOR 5 DAYS Performed at [facility] [address], [place], [place] [address]    Report Status PENDING  Incomplete     Total time spend on discharging this patient, including the last patient exam, discussing the hospital stay, instructions for ongoing care as it relates to all pertinent caregivers, as well as preparing the medical discharge records, prescriptions, and/or referrals as applicable, is 45 minutes.    Enzo Bi, MD  [facility] 06/17/2022, 7:58 PM

## 2022-06-17 NOTE — Plan of Care (Signed)
Patient discharged per MD orders at this time.All discharge instructions, education and medications reviewed with the patient and daughter.Pt expressed understanding and will comply with dc instructions.follow up appointments was also communicated to the patient.no verbal c/o or any ssx of distress.Pt was discharged home with HH/nursing/wound tx per order.patient was transported home by daughter in a privately owned vehicle.

## 2022-06-17 NOTE — Care Management Important Message (Signed)
Important Message  Patient Details  Name: [name] MRN: [id number] Date of Birth: September 12, 1946   Medicare Important Message Given:  Yes     Juliann Pulse A Launa Goedken 06/17/2022, 9:56 AM

## 2022-06-17 NOTE — TOC Transition Note (Signed)
Transition of Care [facility]) - CM/SW Discharge Note   Patient Details  Name: [name] MRN: [id number] Date of Birth: December 03, 1946  Transition of [facility]) CM/SW Contact:  Beverly Sessions, RN Phone Number: 06/17/2022, 9:02 AM   Clinical Narrative:    Patient to discharge today [name] with [facility] home health notified Daughter to transport at discharge      Barriers to Discharge: Continued Medical Work up   Patient Goals and CMS Choice Patient states their goals for this hospitalization and ongoing recovery are:: to go home and continue PT CMS Medicare.gov Compare Post Acute Care list provided to:: Patient Choice offered to / list presented to : Patient  Discharge Placement                       Discharge Plan and Services   Discharge Planning Services: CM Consult Post Acute Care Choice: Home Health                    HH Arranged: PT, OT [facility] Agency: [place] Date [place]: 06/15/22   Representative spoke with at New Site: [place] ([place]) Interventions     Readmission Risk Interventions     No data to display

## 2022-06-18 LAB — CULTURE, BLOOD (ROUTINE X 2)
Culture: NO GROWTH
Culture: NO GROWTH

## 2022-06-19 LAB — AEROBIC/ANAEROBIC CULTURE W GRAM STAIN (SURGICAL/DEEP WOUND)
Culture: NO GROWTH
Gram Stain: NONE SEEN

## 2022-06-25 ENCOUNTER — Ambulatory Visit (INDEPENDENT_AMBULATORY_CARE_PROVIDER_SITE_OTHER): Payer: Medicare Other | Admitting: Vascular Surgery

## 2022-06-25 ENCOUNTER — Encounter (INDEPENDENT_AMBULATORY_CARE_PROVIDER_SITE_OTHER): Payer: Self-pay | Admitting: Vascular Surgery

## 2022-06-25 VITALS — BP 89/56 | HR 81 | Resp 14 | Ht 72.0 in | Wt 231.0 lb

## 2022-06-25 DIAGNOSIS — I7025 Atherosclerosis of native arteries of other extremities with ulceration: Secondary | ICD-10-CM

## 2022-06-25 DIAGNOSIS — I10 Essential (primary) hypertension: Secondary | ICD-10-CM

## 2022-06-25 DIAGNOSIS — I25119 Atherosclerotic heart disease of native coronary artery with unspecified angina pectoris: Secondary | ICD-10-CM

## 2022-06-25 DIAGNOSIS — E1159 Type 2 diabetes mellitus with other circulatory complications: Secondary | ICD-10-CM

## 2022-06-25 DIAGNOSIS — Z1211 Encounter for screening for malignant neoplasm of colon: Secondary | ICD-10-CM | POA: Insufficient documentation

## 2022-06-25 DIAGNOSIS — I779 Disorder of arteries and arterioles, unspecified: Secondary | ICD-10-CM | POA: Diagnosis not present

## 2022-06-25 DIAGNOSIS — M2550 Pain in unspecified joint: Secondary | ICD-10-CM | POA: Insufficient documentation

## 2022-06-28 ENCOUNTER — Encounter (INDEPENDENT_AMBULATORY_CARE_PROVIDER_SITE_OTHER): Payer: Self-pay | Admitting: Vascular Surgery

## 2022-06-28 NOTE — Progress Notes (Signed)
MRN : [id number]  [name] is a 76 y.o. (April 18, 1946) male who presents with chief complaint of check circulation.  History of Present Illness:   The patient returns to the office for followup and review status post angiogram with intervention on 06/16/2022.   Procedure:  Percutaneous transluminal angioplasty right peroneal               The patient notes improvement in the lower extremity symptoms. No interval shortening of the patient's claudication distance and the previous rest pain symptoms are gone. No new ulcers or wounds have occurred since the last visit.  Right great toe flap looks very good.   No documented history of amaurosis fugax or recent TIA symptoms. There are no recent neurological changes noted. No documented history of DVT, PE or superficial thrombophlebitis. The patient denies recent episodes of angina or shortness of breath.    Current Meds  Medication Sig   amoxicillin-clavulanate (AUGMENTIN) 875-125 MG tablet Take 1 tablet by mouth in the morning and at bedtime.   apixaban (ELIQUIS) 5 MG TABS tablet Take 1 tablet (5 mg total) by mouth 2 (two) times daily.   calcium carbonate (TUMS - DOSED IN MG ELEMENTAL CALCIUM) 500 MG chewable tablet Chew 1 tablet by mouth daily as needed for indigestion or heartburn.   cholecalciferol (VITAMIN D) 1000 UNITS tablet Take 1,000 Units by mouth daily.   clopidogrel (PLAVIX) 75 MG tablet Take 1 tablet (75 mg total) by mouth daily with breakfast.   docusate sodium (COLACE) 100 MG capsule Take 100 mg by mouth daily.   finasteride (PROSCAR) 5 MG tablet Take 5 mg by mouth daily.   furosemide (LASIX) 80 MG tablet Take 80 mg by mouth daily.   gabapentin (NEURONTIN) 100 MG capsule Take 100 mg by mouth 3 (three) times daily.   icosapent Ethyl (VASCEPA) 1 g capsule Take 2 g by mouth 2 (two) times daily.    insulin aspart (NOVOLOG) 100 UNIT/ML injection Inject 20 Units into the skin 3 (three) times daily  before meals. (Patient taking differently: Inject 20-25 Units into the skin See admin instructions. Inject 20 units before breakfast, 20 units before lunch, and 25 units before supper)   insulin glargine (LANTUS) 100 UNIT/ML injection Inject 0.9 mLs (90 Units total) into the skin at bedtime. Home med.   Investigational - Study Medication Take 1 tablet by mouth daily. Study name: IDS study medication  Additional study details: [facility] in [place]   metFORMIN (GLUCOPHAGE) 1000 MG tablet Take 1,000 mg by mouth 2 (two) times daily with a meal.    metoprolol tartrate (LOPRESSOR) 25 MG tablet Take 25 mg by mouth 2 (two) times daily.   nortriptyline (PAMELOR) 10 MG capsule Take 30 mg by mouth at bedtime.    PARoxetine (PAXIL) 40 MG tablet Take 60 mg by mouth daily.    rOPINIRole (REQUIP) 0.5 MG tablet Take 0.5 mg by mouth at bedtime.   simvastatin (ZOCOR) 80 MG tablet Take 40 mg by mouth at bedtime.    tamsulosin (FLOMAX) 0.4 MG CAPS capsule Take 0.4 mg by mouth daily after lunch.    TRULICITY 4.5 WU/9.8JX SOPN Inject into the skin.   vitamin B-12 (CYANOCOBALAMIN) 1000 MCG tablet Take 1,000 mcg by mouth at bedtime.    Past Medical History:  Diagnosis Date   Atrial fibrillation (HCC)    BPH (benign prostatic hyperplasia)    Depression  Diabetes mellitus without complication ([place])    Diabetic foot ulcers ([place])    HLD (hyperlipidemia)    Hypertension    Neuropathy    PTSD (post-traumatic stress disorder)    Restless leg syndrome    Stroke [facility])    [place] in 2017 and mulitiple TIAs since per daughter    Past Surgical History:  Procedure Laterality Date   AMPUTATION TOE Right 06/14/2022   Procedure: RIGHT GREAT TOE AMPUTATION;  Surgeon: Samara Deist, DPM;  Location: [facility] ORS;  Service: Podiatry;  Laterality: Right;   BYPASS GRAFT     CARDIAC CATHETERIZATION Left 10/28/2016   Procedure: Left Heart Cath and Coronary Angiography;  Surgeon: Corey Skains, MD;  Location: [place]  [facility];  Service: Cardiovascular;  Laterality: Left;   CARDIOVERSION N/A 09/25/2020   Procedure: CARDIOVERSION;  Surgeon: Corey Skains, MD;  Location: [facility] ORS;  Service: Cardiovascular;  Laterality: N/A;   LOWER EXTREMITY ANGIOGRAPHY Right 06/16/2022   Procedure: Lower Extremity Angiography;  Surgeon: Katha Cabal, MD;  Location: [place] [facility];  Service: Cardiovascular;  Laterality: Right;   SHOULDER ARTHROSCOPY     UVULOPALATOPHARYNGOPLASTY, TONSILLECTOMY AND SEPTOPLASTY      Social History Social History   Tobacco Use   Smoking status: Former    Types: Cigarettes   Smokeless tobacco: Never  Vaping Use   Vaping Use: Never used  Substance Use Topics   Alcohol use: No    Alcohol/week: 0.0 standard drinks of alcohol   Drug use: No    Family History Family History  Problem Relation Age of Onset   Hypertension Other    Diabetes Mellitus II Other    Diabetes Mother    Diabetes Sister    Atrial fibrillation Sister    Atrial fibrillation Father    Atrial fibrillation Daughter     No Known Allergies   REVIEW OF SYSTEMS (Negative unless checked)  Constitutional: '[]'$ Weight loss  '[]'$ Fever  '[]'$ Chills Cardiac: '[]'$ Chest pain   '[]'$ Chest pressure   '[]'$ Palpitations   '[]'$ Shortness of breath when laying flat   '[]'$ Shortness of breath with exertion. Vascular:  '[x]'$ Pain in legs with walking   '[]'$ Pain in legs at rest  '[]'$ History of DVT   '[]'$ Phlebitis   '[]'$ Swelling in legs   '[]'$ Varicose veins   '[]'$ Non-healing ulcers Pulmonary:   '[]'$ Uses home oxygen   '[]'$ Productive cough   '[]'$ Hemoptysis   '[]'$ Wheeze  '[]'$ COPD   '[]'$ Asthma Neurologic:  '[]'$ Dizziness   '[]'$ Seizures   '[]'$ History of stroke   '[]'$ History of TIA  '[]'$ Aphasia   '[]'$ Vissual changes   '[]'$ Weakness or numbness in arm   '[]'$ Weakness or numbness in leg Musculoskeletal:   '[]'$ Joint swelling   '[]'$ Joint pain   '[]'$ Low back pain Hematologic:  '[]'$ Easy bruising  '[]'$ Easy bleeding   '[]'$ Hypercoagulable state   '[]'$ Anemic Gastrointestinal:  '[]'$ Diarrhea   '[]'$ Vomiting   '[]'$ Gastroesophageal reflux/heartburn   '[]'$ Difficulty swallowing. Genitourinary:  '[]'$ Chronic kidney disease   '[]'$ Difficult urination  '[]'$ Frequent urination   '[]'$ Blood in urine Skin:  '[]'$ Rashes   '[]'$ Ulcers  Psychological:  '[]'$ History of anxiety   '[]'$  History of major depression.  Physical Examination  Vitals:   06/25/22 1151  BP: (!) 89/56  Pulse: 81  Resp: 14  Weight: 231 lb (104.8 kg)  Height: 6' (1.829 m)   Body mass index is 31.33 kg/m. Gen: WD/WN, NAD Head: [employer]/AT, No temporalis wasting.  Ear/Nose/Throat: Hearing grossly intact, nares w/o erythema or drainage Eyes: PER, EOMI, sclera nonicteric.  Neck: Supple, no masses.  No bruit or  JVD.  Pulmonary:  Good air movement, no audible wheezing, no use of accessory muscles.  Cardiac: RRR, normal S1, S2, no Murmurs. Vascular:  mild trophic changes, no open wounds right great toe flap looks healthy brisk cap refill Vessel Right Left  Radial Palpable Palpable  PT Not Palpable Not Palpable  DP Not Palpable Not Palpable  Gastrointestinal: soft, non-distended. No guarding/no peritoneal signs.  Musculoskeletal: M/S 5/5 throughout.  No visible deformity.  Neurologic: CN 2-12 intact. Pain and light touch intact in extremities.  Symmetrical.  Speech is fluent. Motor exam as listed above. Psychiatric: Judgment intact, Mood & affect appropriate for pt's clinical situation. Dermatologic: No rashes or ulcers noted.  No changes consistent with cellulitis.   CBC Lab Results  Component Value Date   WBC 9.3 06/17/2022   HGB 11.2 (L) 06/17/2022   HCT 34.2 (L) 06/17/2022   MCV 90.2 06/17/2022   PLT 183 06/17/2022    BMET    Component Value Date/Time   NA 141 06/17/2022 0341   K 4.0 06/17/2022 0341   CL 109 06/17/2022 0341   CO2 26 06/17/2022 0341   GLUCOSE 187 (H) 06/17/2022 0341   BUN 19 06/17/2022 0341   CREATININE 1.18 06/17/2022 0341   CALCIUM 9.0 06/17/2022 0341   GFRNONAA >60 06/17/2022 0341   GFRAA >60 03/19/2020 0550   Estimated  Creatinine Clearance: 65.6 mL/min (by C-G formula based on SCr of 1.18 mg/dL).  COAG Lab Results  Component Value Date   INR 1.1 06/13/2022    Radiology PERIPHERAL VASCULAR CATHETERIZATION  Result Date: 06/16/2022 See surgical note for result.  US ARTERIAL ABI (SCREENING LOWER EXTREMITY)  Result Date: 06/13/2022 CLINICAL DATA:  76 year old male with right great toe wound. EXAM: NONINVASIVE PHYSIOLOGIC VASCULAR STUDY OF BILATERAL LOWER EXTREMITIES TECHNIQUE: Evaluation of both lower extremities were performed at rest, including calculation of ankle-brachial indices with single level Doppler, pressure and pulse volume recording. COMPARISON:  None Available. FINDINGS: Right ABI:  0.75 Left ABI:  1.13 Right Lower Extremity:  Monophasic arterial waveforms at the ankle. Left Lower Extremity: Normal triphasic arterial waveforms at the ankle. IMPRESSION: 1. Moderately abnormal right ankle brachial index (0.75) with monophasic waveforms at the right ankle. 2. Normal left ankle brachial index (1.13). Ruthann Cancer, MD [facility] Radiology Electronically Signed   By: Ruthann Cancer M.D.   On: 06/13/2022 15:39   DG Toe Great Right  Result Date: 06/13/2022 CLINICAL DATA:  Infected right distal tuft great toe. Possible osteomyelitis. EXAM: RIGHT GREAT TOE COMPARISON:  None Available. FINDINGS: Mild degenerate change of the first MTP joint and first interphalangeal joint. Exam demonstrates subtle lucency and ill definition of the distal cortex/tuft of the first distal phalanx which could be seen due to osteomyelitis. Slight irregularity of the superficial soft tissues in this area correlate to patient's soft tissue infection. Remainder of the exam is unremarkable. IMPRESSION: Subtle lucency with ill definition of the distal cortex/tuft of the first distal phalanx which could be seen due to osteomyelitis. Electronically Signed   By: Marin Olp M.D.   On:  06/13/2022 08:37     Assessment/Plan 1. Atherosclerosis of native arteries of the extremities with ulceration ([place]) Recommend:  The patient is status post successful angiogram with intervention.  The patient reports that the claudication symptoms and leg pain has improved.   The patient denies lifestyle limiting changes at this point in time.  No further invasive studies, angiography or surgery at this time The patient should  continue walking and begin a more formal exercise program.  The patient should continue antiplatelet therapy and aggressive treatment of the lipid abnormalities  Continued surveillance is indicated as atherosclerosis is likely to progress with time.    Patient should undergo noninvasive studies as ordered. The patient will follow up with me to review the studies in about 2 weeks.   - VAS [place] ABI WITH/WO TBI; Future  2. Bilateral carotid artery disease, unspecified type ([place]) Recommend:  Given the patient's asymptomatic subcritical stenosis no further invasive testing or surgery at this time.  Duplex ultrasound from 2021 shows <30% bilateral ICA stenosis.  Continue antiplatelet therapy as prescribed Continue management of CAD, HTN and Hyperlipidemia Healthy heart diet,  encouraged exercise at least 4 times per week Follow up in 6 months with duplex ultrasound and physical exam    3. Coronary artery disease involving native coronary artery of native heart with angina pectoris ([place]) Continue cardiac and antihypertensive medications as already ordered and reviewed, no changes at this time.  Continue statin as ordered and reviewed, no changes at this time  Nitrates PRN for chest pain   4. Primary hypertension Continue antihypertensive medications as already ordered, these medications have been reviewed and there are no changes at this time.   5. Type 2 diabetes mellitus with vascular disease ([place]) Continue hypoglycemic medications as already ordered, these  medications have been reviewed and there are no changes at this time.  Hgb A1C to be monitored as already arranged by primary service     [name], MD  06/28/2022 4:58 PM

## 2022-07-09 ENCOUNTER — Encounter (INDEPENDENT_AMBULATORY_CARE_PROVIDER_SITE_OTHER): Payer: Self-pay | Admitting: Vascular Surgery

## 2022-07-09 ENCOUNTER — Ambulatory Visit (INDEPENDENT_AMBULATORY_CARE_PROVIDER_SITE_OTHER): Payer: Medicare Other | Admitting: Vascular Surgery

## 2022-07-09 ENCOUNTER — Ambulatory Visit (INDEPENDENT_AMBULATORY_CARE_PROVIDER_SITE_OTHER): Payer: Medicare Other

## 2022-07-09 VITALS — BP 115/69 | HR 80 | Resp 16 | Wt 226.0 lb

## 2022-07-09 DIAGNOSIS — I779 Disorder of arteries and arterioles, unspecified: Secondary | ICD-10-CM

## 2022-07-09 DIAGNOSIS — I10 Essential (primary) hypertension: Secondary | ICD-10-CM | POA: Diagnosis not present

## 2022-07-09 DIAGNOSIS — I482 Chronic atrial fibrillation, unspecified: Secondary | ICD-10-CM | POA: Diagnosis not present

## 2022-07-09 DIAGNOSIS — I7025 Atherosclerosis of native arteries of other extremities with ulceration: Secondary | ICD-10-CM

## 2022-07-09 DIAGNOSIS — E1159 Type 2 diabetes mellitus with other circulatory complications: Secondary | ICD-10-CM

## 2022-07-10 ENCOUNTER — Encounter (INDEPENDENT_AMBULATORY_CARE_PROVIDER_SITE_OTHER): Payer: Self-pay | Admitting: Vascular Surgery

## 2022-07-10 NOTE — Progress Notes (Signed)
MRN : [id number]  [name] is a 76 y.o. (04-18-1946) male who presents with chief complaint of check circulation.  History of Present Illness:   The patient returns to the office for followup and review status post angiogram with intervention on 06/16/2022.    Procedure:  Percutaneous transluminal angioplasty right peroneal                The patient notes improvement in the lower extremity symptoms. No interval shortening of the patient's claudication distance and the previous rest pain symptoms are gone. No new ulcers or wounds have occurred since the last visit.  Right great toe flap looks very good.     No documented history of amaurosis fugax or recent TIA symptoms. There are no recent neurological changes noted. No documented history of DVT, PE or superficial thrombophlebitis. The patient denies recent episodes of angina or shortness of breath.   Current Meds  Medication Sig   apixaban (ELIQUIS) 5 MG TABS tablet Take 1 tablet (5 mg total) by mouth 2 (two) times daily.   calcium carbonate (TUMS - DOSED IN MG ELEMENTAL CALCIUM) 500 MG chewable tablet Chew 1 tablet by mouth daily as needed for indigestion or heartburn.   cholecalciferol (VITAMIN D) 1000 UNITS tablet Take 1,000 Units by mouth daily.   clopidogrel (PLAVIX) 75 MG tablet Take 1 tablet (75 mg total) by mouth daily with breakfast.   docusate sodium (COLACE) 100 MG capsule Take 100 mg by mouth daily.   finasteride (PROSCAR) 5 MG tablet Take 5 mg by mouth daily.   furosemide (LASIX) 80 MG tablet Take 80 mg by mouth daily.   gabapentin (NEURONTIN) 100 MG capsule Take 100 mg by mouth 3 (three) times daily.   icosapent Ethyl (VASCEPA) 1 g capsule Take 2 g by mouth 2 (two) times daily.    insulin aspart (NOVOLOG) 100 UNIT/ML injection Inject 20 Units into the skin 3 (three) times daily before meals. (Patient taking differently: Inject 20-25 Units into the skin See admin instructions. Inject 20  units before breakfast, 20 units before lunch, and 25 units before supper)   insulin glargine (LANTUS) 100 UNIT/ML injection Inject 0.9 mLs (90 Units total) into the skin at bedtime. Home med.   Investigational - Study Medication Take 1 tablet by mouth daily. Study name: IDS study medication  Additional study details: [facility] in [place]   metFORMIN (GLUCOPHAGE) 1000 MG tablet Take 1,000 mg by mouth 2 (two) times daily with a meal.    metoprolol tartrate (LOPRESSOR) 25 MG tablet Take 25 mg by mouth 2 (two) times daily.   nortriptyline (PAMELOR) 10 MG capsule Take 30 mg by mouth at bedtime.    PARoxetine (PAXIL) 40 MG tablet Take 60 mg by mouth daily.    rOPINIRole (REQUIP) 0.5 MG tablet Take 0.5 mg by mouth at bedtime.   simvastatin (ZOCOR) 80 MG tablet Take 40 mg by mouth at bedtime.    tamsulosin (FLOMAX) 0.4 MG CAPS capsule Take 0.4 mg by mouth daily after lunch.    TRULICITY 4.5 UD/1.4HF SOPN Inject into the skin.   vitamin B-12 (CYANOCOBALAMIN) 1000 MCG tablet Take 1,000 mcg by mouth at bedtime.    Past Medical History:  Diagnosis Date   Atrial fibrillation (HCC)    BPH (benign prostatic hyperplasia)    Depression    Diabetes mellitus without complication ([place])    Diabetic foot ulcers ([place])  HLD (hyperlipidemia)    Hypertension    Neuropathy    PTSD (post-traumatic stress disorder)    Restless leg syndrome    Stroke [facility])    [place] in 2017 and mulitiple TIAs since per daughter    Past Surgical History:  Procedure Laterality Date   AMPUTATION TOE Right 06/14/2022   Procedure: RIGHT GREAT TOE AMPUTATION;  Surgeon: Samara Deist, DPM;  Location: [facility] ORS;  Service: Podiatry;  Laterality: Right;   BYPASS GRAFT     CARDIAC CATHETERIZATION Left 10/28/2016   Procedure: Left Heart Cath and Coronary Angiography;  Surgeon: Corey Skains, MD;  Location: [place] [facility];  Service: Cardiovascular;  Laterality: Left;   CARDIOVERSION N/A 09/25/2020   Procedure:  CARDIOVERSION;  Surgeon: Corey Skains, MD;  Location: [facility] ORS;  Service: Cardiovascular;  Laterality: N/A;   LOWER EXTREMITY ANGIOGRAPHY Right 06/16/2022   Procedure: Lower Extremity Angiography;  Surgeon: Katha Cabal, MD;  Location: [place] [facility];  Service: Cardiovascular;  Laterality: Right;   SHOULDER ARTHROSCOPY     UVULOPALATOPHARYNGOPLASTY, TONSILLECTOMY AND SEPTOPLASTY      Social History Social History   Tobacco Use   Smoking status: Former    Types: Cigarettes   Smokeless tobacco: Never  Vaping Use   Vaping Use: Never used  Substance Use Topics   Alcohol use: No    Alcohol/week: 0.0 standard drinks of alcohol   Drug use: No    Family History Family History  Problem Relation Age of Onset   Hypertension Other    Diabetes Mellitus II Other    Diabetes Mother    Diabetes Sister    Atrial fibrillation Sister    Atrial fibrillation Father    Atrial fibrillation Daughter     No Known Allergies   REVIEW OF SYSTEMS (Negative unless checked)  Constitutional: '[]'$ Weight loss  '[]'$ Fever  '[]'$ Chills Cardiac: '[]'$ Chest pain   '[]'$ Chest pressure   '[]'$ Palpitations   '[]'$ Shortness of breath when laying flat   '[]'$ Shortness of breath with exertion. Vascular:  '[x]'$ Pain in legs with walking   '[]'$ Pain in legs at rest  '[]'$ History of DVT   '[]'$ Phlebitis   '[]'$ Swelling in legs   '[]'$ Varicose veins   '[]'$ Non-healing ulcers Pulmonary:   '[]'$ Uses home oxygen   '[]'$ Productive cough   '[]'$ Hemoptysis   '[]'$ Wheeze  '[]'$ COPD   '[]'$ Asthma Neurologic:  '[]'$ Dizziness   '[]'$ Seizures   '[]'$ History of stroke   '[]'$ History of TIA  '[]'$ Aphasia   '[]'$ Vissual changes   '[]'$ Weakness or numbness in arm   '[]'$ Weakness or numbness in leg Musculoskeletal:   '[]'$ Joint swelling   '[]'$ Joint pain   '[]'$ Low back pain Hematologic:  '[]'$ Easy bruising  '[]'$ Easy bleeding   '[]'$ Hypercoagulable state   '[]'$ Anemic Gastrointestinal:  '[]'$ Diarrhea   '[]'$ Vomiting  '[]'$ Gastroesophageal reflux/heartburn   '[]'$ Difficulty swallowing. Genitourinary:  '[]'$ Chronic kidney disease    '[]'$ Difficult urination  '[]'$ Frequent urination   '[]'$ Blood in urine Skin:  '[]'$ Rashes   '[]'$ Ulcers  Psychological:  '[]'$ History of anxiety   '[]'$  History of major depression.  Physical Examination  Vitals:   07/09/22 1533  BP: 115/69  Pulse: 80  Resp: 16  Weight: 226 lb (102.5 kg)   Body mass index is 30.65 kg/m. Gen: WD/WN, NAD Head: [place]/AT, No temporalis wasting.  Ear/Nose/Throat: Hearing grossly intact, nares w/o erythema or drainage Eyes: PER, EOMI, sclera nonicteric.  Neck: Supple, no masses.  No bruit or JVD.  Pulmonary:  Good air movement, no audible wheezing, no use of accessory muscles.  Cardiac: RRR, normal S1, S2,  no Murmurs. Vascular:  right foot warm with brisk cap refill, partial amputation of great toe with healthy flap, 2nd toe bandaged Vessel Right Left  Radial Palpable Palpable  PT Not Palpable Not Palpable  DP Not Palpable Not Palpable  Gastrointestinal: soft, non-distended. No guarding/no peritoneal signs.  Musculoskeletal: M/S 5/5 throughout.  No visible deformity.  Neurologic: CN 2-12 intact. Pain and light touch intact in extremities.  Symmetrical.  Speech is fluent. Motor exam as listed above. Psychiatric: Judgment intact, Mood & affect appropriate for pt's clinical situation. Dermatologic: No rashes or ulcers noted.  No changes consistent with cellulitis.   CBC Lab Results  Component Value Date   WBC 9.3 06/17/2022   HGB 11.2 (L) 06/17/2022   HCT 34.2 (L) 06/17/2022   MCV 90.2 06/17/2022   PLT 183 06/17/2022    BMET    Component Value Date/Time   NA 141 06/17/2022 0341   K 4.0 06/17/2022 0341   CL 109 06/17/2022 0341   CO2 26 06/17/2022 0341   GLUCOSE 187 (H) 06/17/2022 0341   BUN 19 06/17/2022 0341   CREATININE 1.18 06/17/2022 0341   CALCIUM 9.0 06/17/2022 0341   GFRNONAA >60 06/17/2022 0341   GFRAA >60 03/19/2020 0550   CrCl cannot be calculated (Patient's most recent lab result is older than the maximum 21 days allowed.).  COAG Lab Results   Component Value Date   INR 1.1 06/13/2022    Radiology VAS [place] ABI WITH/WO TBI  Result Date: 07/09/2022  LOWER EXTREMITY DOPPLER STUDY Patient Name:  OLLIS DAUDELIN  Date of Exam:   07/09/2022 Medical Rec #: [id number]              Accession #:    [id number] Date of Birth: [birth year]              Patient Gender: M Patient Age:   71 years Exam Location:  [facility] Procedure:      VAS [place] ABI WITH/WO TBI Referring Phys: [facility] --------------------------------------------------------------------------------  Indications: Ulceration, and peripheral artery disease. High Risk Factors: Hypertension, Diabetes, past history of smoking, prior CVA.  Vascular Interventions: Right peroneal artery PTA 06/16/2022                          Right great toe amputation 06/14/2022. Performing Technologist: Delorise Shiner RVT  Examination Guidelines: A complete evaluation includes at minimum, Doppler waveform signals and systolic blood pressure reading at the level of bilateral brachial, anterior tibial, and posterior tibial arteries, when vessel segments are accessible. Bilateral testing is considered an integral part of a complete examination. Photoelectric Plethysmograph (PPG) waveforms and toe systolic pressure readings are included as required and additional duplex testing as needed. Limited examinations for reoccurring indications may be performed as noted.  ABI Findings: +--------+------------------+-----+--------+--------+ Right   Rt Pressure (mmHg)IndexWaveformComment  +--------+------------------+-----+--------+--------+ Brachial170                                     +--------+------------------+-----+--------+--------+ ATA     168               0.97 biphasic         +--------+------------------+-----+--------+--------+ PTA     157               0.90 biphasic         +--------+------------------+-----+--------+--------+  +---------+------------------+-----+--------+-------+ Left  Lt Pressure (mmHg)IndexWaveformComment +---------+------------------+-----+--------+-------+ Brachial 174                                    +---------+------------------+-----+--------+-------+ ATA      175               1.01 biphasic        +---------+------------------+-----+--------+-------+ PTA      167               0.96 biphasic        +---------+------------------+-----+--------+-------+ Great Toe135               0.78                 +---------+------------------+-----+--------+-------+ +-------+-----------+-----------+------------+------------+ ABI/TBIToday's ABIToday's TBIPrevious ABIPrevious TBI +-------+-----------+-----------+------------+------------+ Right  0.97       Amputation 0.77        0.55         +-------+-----------+-----------+------------+------------+ Left   1.01       0.78       1.23        1.01         +-------+-----------+-----------+------------+------------+  Right ABIs appear increased compared to prior study on 12/30/2020. Left ABIs appear essentially unchanged compared to prior study on 12/30/2020.  Summary: Right: Resting right ankle-brachial index is within normal range. No evidence of significant right lower extremity arterial disease. Left: Resting left ankle-brachial index is within normal range. No evidence of significant left lower extremity arterial disease. The left toe-brachial index is normal. *See table(s) above for measurements and observations.  Electronically signed by [name] MD on 07/09/2022 at 5:43:15 PM.    Final    PERIPHERAL VASCULAR CATHETERIZATION  Result Date: 06/16/2022 See surgical note for result.  US ARTERIAL ABI (SCREENING LOWER EXTREMITY)  Result Date: 06/13/2022 CLINICAL DATA:  76 year old male with right great toe wound. EXAM: NONINVASIVE PHYSIOLOGIC VASCULAR STUDY OF BILATERAL LOWER EXTREMITIES TECHNIQUE: Evaluation of both  lower extremities were performed at rest, including calculation of ankle-brachial indices with single level Doppler, pressure and pulse volume recording. COMPARISON:  None Available. FINDINGS: Right ABI:  0.75 Left ABI:  1.13 Right Lower Extremity:  Monophasic arterial waveforms at the ankle. Left Lower Extremity: Normal triphasic arterial waveforms at the ankle. IMPRESSION: 1. Moderately abnormal right ankle brachial index (0.75) with monophasic waveforms at the right ankle. 2. Normal left ankle brachial index (1.13). Ruthann Cancer, MD [facility] Radiology Electronically Signed   By: Ruthann Cancer M.D.   On: 06/13/2022 15:39   DG Toe Great Right  Result Date: 06/13/2022 CLINICAL DATA:  Infected right distal tuft great toe. Possible osteomyelitis. EXAM: RIGHT GREAT TOE COMPARISON:  None Available. FINDINGS: Mild degenerate change of the first MTP joint and first interphalangeal joint. Exam demonstrates subtle lucency and ill definition of the distal cortex/tuft of the first distal phalanx which could be seen due to osteomyelitis. Slight irregularity of the superficial soft tissues in this area correlate to patient's soft tissue infection. Remainder of the exam is unremarkable. IMPRESSION: Subtle lucency with ill definition of the distal cortex/tuft of the first distal phalanx which could be seen due to osteomyelitis. Electronically Signed   By: Marin Olp M.D.   On: 06/13/2022 08:37     Assessment/Plan 1. Atherosclerosis of native arteries of the extremities with ulceration ([place]) Recommend:   The patient is status post successful angiogram with intervention.  The  patient reports that the claudication symptoms and leg pain has improved.   The patient denies lifestyle limiting changes at this point in time.   No further invasive studies, angiography or surgery at this time The patient should continue walking and begin a more formal exercise program.   The patient should continue antiplatelet therapy and aggressive treatment of the lipid abnormalities   Continued surveillance is indicated as atherosclerosis is likely to progress with time.     Patient should undergo noninvasive studies as ordered. The patient will follow up with me to review the studies in about 3 months.   - VAS [place] ABI WITH/WO TBI - VAS [place] ABI WITH/WO TBI; Future - VAS [place] LOWER EXTREMITY ARTERIAL DUPLEX; Future  2. Bilateral carotid artery disease, unspecified type ([place]) Recommend:   Given the patient's asymptomatic subcritical stenosis no further invasive testing or surgery at this time.   Duplex ultrasound from 2021 shows <30% bilateral ICA stenosis.   Continue antiplatelet therapy as prescribed Continue management of CAD, HTN and Hyperlipidemia Healthy heart diet,  encouraged exercise at least 4 times per week Follow up in 6 months with duplex ultrasound and physical exam    3. Atrial fibrillation, chronic (HCC) Continue antiarrhythmia medications as already ordered, these medications have been reviewed and there are no changes at this time.  Continue anticoagulation as ordered by Cardiology Service   4. Hypertension, essential Continue antihypertensive medications as already ordered, these medications have been reviewed and there are no changes at this time.   5. Type 2 diabetes mellitus with vascular disease ([place]) Continue hypoglycemic medications as already ordered, these medications have been reviewed and there are no changes at this time.  Hgb A1C to be monitored as already arranged by primary service     [name], MD  07/10/2022 1:49 PM

## 2022-07-23 ENCOUNTER — Telehealth (INDEPENDENT_AMBULATORY_CARE_PROVIDER_SITE_OTHER): Payer: Self-pay | Admitting: Vascular Surgery

## 2022-07-23 NOTE — Telephone Encounter (Signed)
Patients daughter called in stating that father is unable to give her correct information. Patient had an appt 2 weeks ago and is telling daughter that everything is ok now. Patients daughter is wanting to know if clots have been taking care of and what's the next follow up steps. Patients daughter is wanting to know why patient is not having another angiogram if he was 90% block and had 2 other arteries blocked as well    Just wanting to speak with provider because dad(patient) is saying at appt everything is fine and at home its not    Please call and advise

## 2022-07-28 NOTE — Telephone Encounter (Signed)
Patient called in again upset because she hasn't gotten a call back and would like to speak with the Dr about her father    Please call and advise

## 2022-07-30 NOTE — Telephone Encounter (Signed)
Dr Delana Meyer spoke with daughter

## 2022-09-01 ENCOUNTER — Encounter: Payer: Self-pay | Admitting: Internal Medicine

## 2022-09-01 NOTE — Anesthesia Preprocedure Evaluation (Signed)
Anesthesia Evaluation  Patient identified by MRN, date of birth, ID band Patient awake    Reviewed: Allergy & Precautions, NPO status , Patient's Chart, lab work & pertinent test results  History of Anesthesia Complications Negative for: history of anesthetic complications  Airway Mallampati: III  TM Distance: >3 FB Neck ROM: Full    Dental no notable dental hx. (+) Missing,    Pulmonary former smoker,    Pulmonary exam normal breath sounds clear to auscultation       Cardiovascular Exercise Tolerance: Good METS: < 3 Mets hypertension, (-) angina (Denies)+ CAD, + CABG, + Peripheral Vascular Disease and +CHF  Normal cardiovascular exam+ dysrhythmias Atrial Fibrillation  Rhythm:Regular Rate:Normal  Bilateral Carotid Dz  Chronic diastolic CHF    Neuro/Psych PSYCHIATRIC DISORDERS Anxiety Depression TIACVA    GI/Hepatic negative GI ROS, Neg liver ROS,   Endo/Other  diabetes, Poorly Controlled, Type 2, Insulin Dependent  Renal/GU Renal InsufficiencyRenal disease  negative genitourinary   Musculoskeletal negative musculoskeletal ROS (+)   Abdominal (+) + obese,   Peds  Hematology negative hematology ROS (+)   Anesthesia Other Findings Patient Name:  [name] Date of Exam: 03/19/2020  Medical Rec #: [id number]       Height:    72.0 in  Accession #:  [id number]      Weight:    200.0 lb  Date of Birth: Jul 30, 1946       BSA:     2.131 m  Patient Age:  76 years       BP:      163/81 mmHg  Patient Gender: M           HR:      78 bpm.  Exam Location: [facility]   Procedure: 2D Echo, Cardiac Doppler and Color Doppler   Indications:   Syncope 780.2    History:     Patient has prior history of Echocardiogram examinations,  most          recent 06/28/2016. Stroke; Risk Factors:Hypertension and          Diabetes.    Sonographer:    Sherrie Sport RDCS (AE)  Referring Phys: [id number] Athena Masse  Diagnosing Phys: Serafina Royals MD     Sonographer Comments: Suboptimal apical window.  IMPRESSIONS    1. Left ventricular ejection fraction, by estimation, is 55 to 60%. The  left ventricle has normal function. The left ventricle has no regional  wall motion abnormalities. Left ventricular diastolic parameters were  normal.  2. Right ventricular systolic function is normal. The right ventricular  size is mildly enlarged. There is normal pulmonary artery systolic  pressure.  3. Left atrial size was mildly dilated.  4. Right atrial size was mildly dilated.  5. The mitral valve is normal in structure. Mild to moderate mitral valve  regurgitation.  6. Tricuspid valve regurgitation is mild to moderate.  7. The aortic valve is normal in structure. Aortic valve regurgitation is  not visualized.   Reproductive/Obstetrics negative OB ROS                            Anesthesia Physical  Anesthesia Plan  ASA: 3  Anesthesia Plan: General   Post-op Pain Management:    Induction: Intravenous  PONV Risk Score and Plan: Propofol infusion and Treatment may vary due to age or medical condition  Airway Management Planned: Natural Airway and Nasal Cannula  Additional Equipment:   Intra-op  Plan:   Post-operative Plan:   Informed Consent: I have reviewed the patients History and Physical, chart, labs and discussed the procedure including the risks, benefits and alternatives for the proposed anesthesia with the patient or authorized representative who has indicated his/her understanding and acceptance.     Dental advisory given  Plan Discussed with: Anesthesiologist, CRNA and Surgeon  Anesthesia Plan Comments: (Trulicity use active. Pt denies gastric retention symptoms. Bedside ultrasound was negative for intra-gastric contents. Will proceed with native airway. Risks discussed with  patient. )       Anesthesia Quick Evaluation

## 2022-09-02 ENCOUNTER — Ambulatory Visit
Admission: RE | Admit: 2022-09-02 | Discharge: 2022-09-02 | Disposition: A | Discharge status: Home / Self Care | Payer: Medicare Other | Attending: Internal Medicine | Admitting: Internal Medicine

## 2022-09-02 ENCOUNTER — Ambulatory Visit: Payer: Medicare Other | Admitting: Anesthesiology

## 2022-09-02 ENCOUNTER — Encounter
Admission: RE | Disposition: A | Discharge status: Home / Self Care | Payer: Self-pay | Source: Home / Self Care | Attending: Internal Medicine

## 2022-09-02 ENCOUNTER — Encounter: Payer: Self-pay | Admitting: Internal Medicine

## 2022-09-02 DIAGNOSIS — I5032 Chronic diastolic (congestive) heart failure: Secondary | ICD-10-CM | POA: Insufficient documentation

## 2022-09-02 DIAGNOSIS — D122 Benign neoplasm of ascending colon: Secondary | ICD-10-CM | POA: Insufficient documentation

## 2022-09-02 DIAGNOSIS — Z7985 Long-term (current) use of injectable non-insulin antidiabetic drugs: Secondary | ICD-10-CM | POA: Diagnosis not present

## 2022-09-02 DIAGNOSIS — F32A Depression, unspecified: Secondary | ICD-10-CM | POA: Insufficient documentation

## 2022-09-02 DIAGNOSIS — Z8673 Personal history of transient ischemic attack (TIA), and cerebral infarction without residual deficits: Secondary | ICD-10-CM | POA: Insufficient documentation

## 2022-09-02 DIAGNOSIS — Z794 Long term (current) use of insulin: Secondary | ICD-10-CM | POA: Insufficient documentation

## 2022-09-02 DIAGNOSIS — Z7984 Long term (current) use of oral hypoglycemic drugs: Secondary | ICD-10-CM | POA: Insufficient documentation

## 2022-09-02 DIAGNOSIS — K640 First degree hemorrhoids: Secondary | ICD-10-CM | POA: Diagnosis not present

## 2022-09-02 DIAGNOSIS — Z87891 Personal history of nicotine dependence: Secondary | ICD-10-CM | POA: Insufficient documentation

## 2022-09-02 DIAGNOSIS — E1151 Type 2 diabetes mellitus with diabetic peripheral angiopathy without gangrene: Secondary | ICD-10-CM | POA: Diagnosis not present

## 2022-09-02 DIAGNOSIS — I110 Hypertensive heart disease with heart failure: Secondary | ICD-10-CM | POA: Diagnosis not present

## 2022-09-02 DIAGNOSIS — N289 Disorder of kidney and ureter, unspecified: Secondary | ICD-10-CM | POA: Diagnosis not present

## 2022-09-02 DIAGNOSIS — F419 Anxiety disorder, unspecified: Secondary | ICD-10-CM | POA: Insufficient documentation

## 2022-09-02 DIAGNOSIS — D123 Benign neoplasm of transverse colon: Secondary | ICD-10-CM | POA: Diagnosis not present

## 2022-09-02 DIAGNOSIS — I4891 Unspecified atrial fibrillation: Secondary | ICD-10-CM | POA: Insufficient documentation

## 2022-09-02 DIAGNOSIS — Z951 Presence of aortocoronary bypass graft: Secondary | ICD-10-CM | POA: Diagnosis not present

## 2022-09-02 DIAGNOSIS — Z86010 Personal history of colonic polyps: Secondary | ICD-10-CM | POA: Diagnosis present

## 2022-09-02 DIAGNOSIS — Z1211 Encounter for screening for malignant neoplasm of colon: Secondary | ICD-10-CM | POA: Diagnosis not present

## 2022-09-02 HISTORY — PX: COLONOSCOPY: SHX5424

## 2022-09-02 LAB — GLUCOSE, CAPILLARY: Glucose-Capillary: 197 mg/dL — ABNORMAL HIGH (ref 70–99)

## 2022-09-02 SURGERY — COLONOSCOPY
Anesthesia: General

## 2022-09-02 MED ORDER — PROPOFOL 1000 MG/100ML IV EMUL
INTRAVENOUS | Status: AC
  Filled 2022-09-02: qty 100

## 2022-09-02 MED ORDER — PROPOFOL 10 MG/ML IV BOLUS
INTRAVENOUS | Status: DC | PRN
  Administered 2022-09-02: 50 mg via INTRAVENOUS

## 2022-09-02 MED ORDER — LIDOCAINE HCL (CARDIAC) PF 100 MG/5ML IV SOSY
PREFILLED_SYRINGE | INTRAVENOUS | Status: DC | PRN
  Administered 2022-09-02: 50 mg via INTRAVENOUS

## 2022-09-02 MED ORDER — PROPOFOL 500 MG/50ML IV EMUL
INTRAVENOUS | Status: DC | PRN
  Administered 2022-09-02: 150 ug/kg/min via INTRAVENOUS

## 2022-09-02 MED ORDER — LIDOCAINE HCL (PF) 2 % IJ SOLN
INTRAMUSCULAR | Status: AC
  Filled 2022-09-02: qty 5

## 2022-09-02 MED ORDER — SODIUM CHLORIDE 0.9 % IV SOLN
INTRAVENOUS | Status: DC
  Administered 2022-09-02: 1000 mL via INTRAVENOUS

## 2022-09-02 NOTE — Transfer of Care (Signed)
Immediate Anesthesia Transfer of Care Note  Patient: [name]  Procedure(s) Performed: COLONOSCOPY  Patient Location: PACU and Endoscopy Unit  Anesthesia Type:General  Level of Consciousness: sedated  Airway & Oxygen Therapy: Patient Spontanous Breathing  Post-op Assessment: Report given to RN  Post vital signs: stable  Last Vitals:  Vitals Value Taken Time  BP 118/69 09/02/22 0926  Temp 36.3 C 09/02/22 0926  Pulse 75 09/02/22 0926  Resp 21 09/02/22 0926  SpO2 92 % 09/02/22 0926  Vitals shown include unvalidated device data.  Last Pain:  Vitals:   09/02/22 0926  TempSrc: Temporal  PainSc:          Complications: No notable events documented.

## 2022-09-02 NOTE — Anesthesia Postprocedure Evaluation (Signed)
Anesthesia Post Note  Patient: [name]  Procedure(s) Performed: COLONOSCOPY  Patient location during evaluation: Endoscopy Anesthesia Type: General Level of consciousness: awake and alert Pain management: pain level controlled Vital Signs Assessment: post-procedure vital signs reviewed and stable Respiratory status: spontaneous breathing, nonlabored ventilation and respiratory function stable Cardiovascular status: blood pressure returned to baseline and stable Postop Assessment: no apparent nausea or vomiting Anesthetic complications: no   No notable events documented.   Last Vitals:  Vitals:   09/02/22 0936 09/02/22 0946  BP: 132/69 134/77  Pulse: 74 72  Resp: 18 (!) 21  Temp:    SpO2: 94% 97%    Last Pain:  Vitals:   09/02/22 0946  TempSrc:   PainSc: 0-No pain                 [place] Ouch

## 2022-09-02 NOTE — Op Note (Signed)
[name] Patient Name: [name] Procedure Date: 09/02/2022 9:03 AM MRN: [id number] Account #: [id number] Date of Birth: 1945/12/30 Admit Type: Outpatient Age: 76 Room: [facility] ENDO ROOM 2 Gender: Male Note Status: Finalized Instrument Name: [name] [id number] Procedure:             Colonoscopy Indications:           Surveillance: Personal history of adenomatous polyps                         on last colonoscopy > 5 years ago Providers:             Lorie Apley K. Alice Reichert MD, [name] Referring [name]:          Ocie Cornfield. Ouida Sills MD, [name] (Referring [name]) Medicines:             Propofol per Anesthesia Complications:         No immediate complications. Procedure:             Pre-Anesthesia Assessment:                        - The risks and benefits of the procedure and the                         sedation options and risks were discussed with the                         patient. All questions were answered and informed                         consent was obtained.                        - Patient identification and proposed procedure were                         verified prior to the procedure by the nurse. The                         procedure was verified in the procedure room.                        - ASA Grade Assessment: III - A patient with severe                         systemic disease.                        - After reviewing the risks and benefits, the patient                         was deemed in satisfactory condition to undergo the                         procedure.                        After obtaining informed consent, the colonoscope was  passed under direct vision. Throughout the procedure,                         the patient's blood pressure, pulse, and oxygen                         saturations were monitored continuously. The                         Colonoscope was introduced through the anus and                          advanced to the the cecum, identified by appendiceal                         orifice and ileocecal valve. The colonoscopy was                         performed without difficulty. The patient tolerated                         the procedure well. The quality of the bowel                         preparation was adequate. The ileocecal valve,                         appendiceal orifice, and rectum were photographed. Findings:      The perianal and digital rectal examinations were normal. Pertinent       negatives include normal sphincter tone and no palpable rectal lesions.      Non-bleeding internal hemorrhoids were found during retroflexion. The       hemorrhoids were Grade I (internal hemorrhoids that do not prolapse).      A 9 mm polyp was found in the ascending colon. The polyp was sessile.       The polyp was removed with a hot snare. The polyp was removed with a       cold snare. Resection and retrieval were complete.      A 5 mm polyp was found in the transverse colon. The polyp was sessile.       The polyp was removed with a jumbo cold forceps. Resection and retrieval       were complete.      The exam was otherwise without abnormality. Impression:            - Non-bleeding internal hemorrhoids.                        - One 9 mm polyp in the ascending colon, removed with                         a cold snare and removed with a hot snare. Resected                         and retrieved.                        - One 5 mm polyp in the transverse colon, removed with  a jumbo cold forceps. Resected and retrieved.                        - The examination was otherwise normal. Recommendation:        - Patient has a contact number available for                         emergencies. The signs and symptoms of potential                         delayed complications were discussed with the patient.                         Return to normal activities tomorrow.  Written                         discharge instructions were provided to the patient.                        - Resume previous diet.                        - Continue present medications.                        - If polyps are benign or adenomatous without                         dysplasia, I will advise NO further colonoscopy due to                         advanced age and/or severe comorbidity.                        - Return to GI office PRN.                        - Return to GI office PRN.                        - Resume Eliquis (apixaban) at prior dose tomorrow.                         Refer to managing physician for further adjustment of                         therapy.                        - Await pathology results.                        - The findings and recommendations were discussed with                         the patient. Procedure Code(s):     --- Professional ---                        [phone], Colonoscopy, flexible; with removal of  tumor(s), polyp(s), or other lesion(s) by snare                         technique                        45380, 59, Colonoscopy, flexible; with biopsy, single                         or multiple Diagnosis Code(s):     --- Professional ---                        K64.0, First degree hemorrhoids                        K63.5, Polyp of colon                        Z86.010, Personal history of colonic polyps CPT copyright 2019 [employer]. All rights reserved. The codes documented in this report are preliminary and upon coder review may  be revised to meet current compliance requirements. Fred Sella MD, [name] 09/02/2022 9:25:31 AM This report has been signed electronically. Number of Addenda: 0 Note Initiated On: 09/02/2022 9:03 AM Scope Withdrawal Time: 0 hours 5 minutes 51 seconds  Total Procedure Duration: 0 hours 10 minutes 33 seconds  Estimated Blood Loss:  Estimated blood loss: none.      [facility]

## 2022-09-02 NOTE — Interval H&P Note (Signed)
History and Physical Interval Note:  09/02/2022 8:57 AM  [name]  has presented today for surgery, with the diagnosis of Personal history of colonic polyps (Z86.010).  The various methods of treatment have been discussed with the patient and family. After consideration of risks, benefits and other options for treatment, the patient has consented to  Procedure(s) with comments: COLONOSCOPY (N/A) - IDDM as a surgical intervention.  The patient's history has been reviewed, patient examined, no change in status, stable for surgery.  I have reviewed the patient's chart and labs.  Questions were answered to the patient's satisfaction.     [place], [place]

## 2022-09-02 NOTE — H&P (Signed)
Outpatient short stay form Pre-procedure 09/02/2022 8:56 AM Gailyn Crook K. Alice Reichert, M.D.  Primary Physician: Frazier Richards, M.D.  Reason for visit:  Personal history of adenomatous colon polyps  History of present illness:                            Patient presents for colonoscopy for a personal hx of colon polyps. The patient denies abdominal pain, abnormal weight loss or rectal bleeding.      Current Facility-Administered Medications:    0.9 %  sodium chloride infusion, , Intravenous, Continuous, [place], Benay Pike, MD, Last Rate: 20 mL/hr at 09/02/22 0833, 1,000 mL at 09/02/22 4098  Medications Prior to Admission  Medication Sig Dispense Refill Last Dose   cholecalciferol (VITAMIN D) 1000 UNITS tablet Take 1,000 Units by mouth daily.   Past Week   docusate sodium (COLACE) 100 MG capsule Take 100 mg by mouth daily.   Past Week   finasteride (PROSCAR) 5 MG tablet Take 5 mg by mouth daily.   09/01/2022   furosemide (LASIX) 80 MG tablet Take 80 mg by mouth daily.   09/01/2022   gabapentin (NEURONTIN) 100 MG capsule Take 100 mg by mouth 3 (three) times daily.   09/01/2022   icosapent Ethyl (VASCEPA) 1 g capsule Take 2 g by mouth 2 (two) times daily.    Past Week   insulin aspart (NOVOLOG) 100 UNIT/ML injection Inject 20 Units into the skin 3 (three) times daily before meals. (Patient taking differently: Inject 20-25 Units into the skin See admin instructions. Inject 20 units before breakfast, 20 units before lunch, and 25 units before supper) 10 mL 0 09/01/2022   insulin glargine (LANTUS) 100 UNIT/ML injection Inject 0.9 mLs (90 Units total) into the skin at bedtime. Home med.   09/01/2022   Investigational - Study Medication Take 1 tablet by mouth daily. Study name: IDS study medication  Additional study details: [facility] in [place]   Past Month   metFORMIN (GLUCOPHAGE) 1000 MG tablet Take 1,000 mg by mouth 2 (two) times daily with a meal.    09/01/2022   metoprolol tartrate (LOPRESSOR) 25 MG  tablet Take 25 mg by mouth 2 (two) times daily.   09/01/2022   nortriptyline (PAMELOR) 10 MG capsule Take 30 mg by mouth at bedtime.    09/01/2022   PARoxetine (PAXIL) 40 MG tablet Take 60 mg by mouth daily.    09/01/2022   rOPINIRole (REQUIP) 0.5 MG tablet Take 0.5 mg by mouth at bedtime.   09/01/2022   simvastatin (ZOCOR) 80 MG tablet Take 40 mg by mouth at bedtime.    09/01/2022   tamsulosin (FLOMAX) 0.4 MG CAPS capsule Take 0.4 mg by mouth daily after lunch.    09/01/2022   vitamin B-12 (CYANOCOBALAMIN) 1000 MCG tablet Take 1,000 mcg by mouth at bedtime.   Past Week   amoxicillin-clavulanate (AUGMENTIN) 875-125 MG tablet Take 1 tablet by mouth in the morning and at bedtime. (Patient not taking: Reported on 07/09/2022)      apixaban (ELIQUIS) 5 MG TABS tablet Take 1 tablet (5 mg total) by mouth 2 (two) times daily. 60 tablet 0 08/27/2022   calcium carbonate (TUMS - DOSED IN MG ELEMENTAL CALCIUM) 500 MG chewable tablet Chew 1 tablet by mouth daily as needed for indigestion or heartburn.    at PRN   TRULICITY 4.5 JX/9.1YN SOPN Inject into the skin.   08/31/2022     No Known Allergies  Past Medical History:  Diagnosis Date   Atrial fibrillation (HCC)    BPH (benign prostatic hyperplasia)    Depression    Diabetes mellitus without complication ([place])    Diabetic foot ulcers ([place])    HLD (hyperlipidemia)    Hypertension    Neuropathy    PTSD (post-traumatic stress disorder)    Restless leg syndrome    Stroke [facility])    [place] in 2017 and mulitiple TIAs since per daughter    Review of systems:  Otherwise negative.    Physical Exam  Gen: Alert, oriented. Appears stated age.  HEENT: [place]/AT. PERRLA. Lungs: CTA, no wheezes. CV: RR nl S1, S2. Abd: soft, benign, no masses. BS+ Ext: No edema. Pulses 2+    Planned procedures: Proceed with colonoscopy. The patient understands the nature of the planned procedure, indications, risks, alternatives and potential complications including but not limited to  bleeding, infection, perforation, damage to internal organs and possible oversedation/side effects from anesthesia. The patient agrees and gives consent to proceed.  Please refer to procedure notes for findings, recommendations and patient disposition/instructions.     Teng Decou K. Alice Reichert, M.D. Gastroenterology 09/02/2022  8:56 AM

## 2022-09-03 ENCOUNTER — Encounter: Payer: Self-pay | Admitting: Internal Medicine

## 2022-09-03 LAB — SURGICAL PATHOLOGY

## 2022-09-08 ENCOUNTER — Telehealth (INDEPENDENT_AMBULATORY_CARE_PROVIDER_SITE_OTHER): Payer: Self-pay | Admitting: Vascular Surgery

## 2022-09-08 ENCOUNTER — Other Ambulatory Visit: Payer: Self-pay | Admitting: Podiatry

## 2022-09-08 NOTE — Telephone Encounter (Signed)
Patient will need to come for appointment with Dr Delana Meyer to discuss questions

## 2022-09-08 NOTE — Telephone Encounter (Signed)
Patients daughter called in still wanting to speak with Dr.Schnier. has concerns being that he has to have another toe amputated     Please call and advise

## 2022-09-10 ENCOUNTER — Telehealth: Payer: Self-pay

## 2022-09-10 NOTE — Telephone Encounter (Signed)
Notes indexed over to MEDIA tab from [facility] for review

## 2022-09-18 ENCOUNTER — Encounter
Admission: RE | Admit: 2022-09-18 | Discharge: 2022-09-18 | Disposition: A | Discharge status: Home / Self Care | Payer: Medicare Other | Source: Ambulatory Visit | Attending: Podiatry | Admitting: Podiatry

## 2022-09-18 VITALS — Ht 72.0 in | Wt 226.0 lb

## 2022-09-18 DIAGNOSIS — E1159 Type 2 diabetes mellitus with other circulatory complications: Secondary | ICD-10-CM

## 2022-09-18 HISTORY — DX: Heart failure, unspecified: I50.9

## 2022-09-18 NOTE — Patient Instructions (Signed)
Your procedure is scheduled on: 09/25/22 Report to [place]. To find out your arrival time please call [phone] between 1PM - 3PM on 09/24/22.  Remember: Instructions that are not followed completely may result in serious medical risk, up to and including death, or upon the discretion of your surgeon and anesthesiologist your surgery may need to be rescheduled.     _X__ 1. Do not eat food after midnight the night before your procedure.                 No gum chewing or hard candies. You may drink clear liquids up to 2 hours                 before you are scheduled to arrive for your surgery- DO not drink clear                 liquids within 2 hours of the start of your surgery.                 Clear Liquids include:   Diabetics water only  __X__2.  On the morning of surgery brush your teeth with toothpaste and water, you                 may rinse your mouth with mouthwash if you wish.  Do not swallow any              toothpaste of mouthwash.     _X__ 3.  No Alcohol for 24 hours before or after surgery.   _X__ 4.  Do Not Smoke or use e-cigarettes For 24 Hours Prior to Your Surgery.                 Do not use any chewable tobacco products for at least 6 hours prior to                 surgery.  ____  5.  Bring all medications with you on the day of surgery if instructed.   __X__  6.  Notify your doctor if there is any change in your medical condition      (cold, fever, infections).     Do not wear jewelry, make-up, hairpins, clips or nail polish. Do not wear lotions, powders, or perfumes.  Do not shave body hair 48 hours prior to surgery. Men may shave face and neck. Do not bring valuables to the hospital.    [facility] is not responsible for any belongings or valuables.  Contacts, dentures/partials or body piercings may not be worn into surgery. Bring a case for your contacts, glasses or hearing aids, a denture cup will be  supplied. Leave your suitcase in the car. After surgery it may be brought to your room. For patients admitted to the hospital, discharge time is determined by your treatment team.   Patients discharged the day of surgery will not be allowed to drive home.    __X__ Take these medicines the morning of surgery with A SIP OF WATER:    1. finasteride (PROSCAR) 5 MG tablet  2. gabapentin (NEURONTIN) 100 MG capsule  3. metoprolol tartrate (LOPRESSOR) 12.5 MG tablet  4. PARoxetine (PAXIL) 60 MG   5.  6.  ____ Fleet Enema (as directed)   ____ Use CHG Soap/SAGE wipes as directed  ____ Use inhalers on the day of surgery  __X__ Stop metformin/Janumet/Farxiga 2 days prior to surgery Last dose will  be Wed morning 9/27   __X__ Take 1/2 of usual BEDTIME insulin dose the night before surgery. No insulin the morning          of surgery.   ____ Stop Blood Thinners Coumadin/Plavix/Xarelto/Pleta/Pradaxa/Eliquis/Effient/Aspirin  on   Or contact your Surgeon, Cardiologist or Medical Doctor regarding  ability to stop your blood thinners  __X__ Stop Anti-inflammatories 7 days before surgery such as Advil, Ibuprofen, Motrin,  BC or Goodies Powder, Naprosyn, Naproxen, Aleve, Aspirin    ____ Stop all herbals and supplements, fish oil or vitamins  until after surgery.    ____ Bring C-Pap to the hospital.    Per Dr Vickki Muff you may continue to take your daily Eliquis. Hold this the day of surgery.

## 2022-09-22 ENCOUNTER — Encounter: Payer: Self-pay | Admitting: Podiatry

## 2022-09-23 ENCOUNTER — Encounter: Payer: Self-pay | Admitting: Podiatry

## 2022-09-23 NOTE — Progress Notes (Signed)
Perioperative Services  Pre-Admission/Anesthesia Testing Clinical Review  Date: 09/24/22  Patient Demographics:  Name: [name] DOB:   1946-09-05 MRN:   [id number]  Planned Surgical Procedure(s):    Case: [id number] Date/Time: 09/25/22 0715   Procedure: AMPUTATION TOE - SECOND (Right: Toe)   Anesthesia type: Choice   Pre-op diagnosis:      E11.42 - Type 2 diabetes mellitus with polyneuropathy     I73.9 - PVD peripheral vascular disease     I96 - Gangrene of toe   Location: [facility] OR ROOM 02 / [place] [facility]S FOR ANESTHESIA GROUP   Surgeons: Samara Deist, DPM   NOTE: Available PAT nursing documentation and vital signs have been reviewed. Clinical nursing staff has updated patient's PMH/PSHx, current medication list, and drug allergies/intolerances to ensure comprehensive history available to assist in medical decision making as it pertains to the aforementioned surgical procedure and anticipated anesthetic course. Extensive review of available clinical information performed. [place] PMH and PSHx updated with any diagnoses/procedures that  may have been inadvertently omitted during his intake with the pre-admission testing department's nursing staff.  Clinical Discussion:  [name] is a 76 y.o. male who is submitted for pre-surgical anesthesia review and clearance prior to him undergoing the above procedure. Patient is a Former Research scientist (life sciences). Pertinent PMH includes: CAD (s/p CABG), CHF, atrial fibrillation, diastolic dysfunction, CVA x 2, multiple TIAs, carotid artery disease, HTN, HLD, T2DM, peripheral neuropathy, diabetic foot ulcers, BPH, RLS, PTSD, depression.  Patient is followed by cardiology [name], MD). He was last seen in the cardiology clinic on 09/10/2022; notes reviewed.  At the time of his clinic visit, patient denied any episodes of chest pain, however he continued to report shortness of breath.  He denied any associated PND, orthopnea, palpitations,  significant peripheral edema, vertiginous symptoms, or presyncope/syncope.  Patient complaining of toe pain.  He has a past medical history significant for cardiovascular diagnoses.  CT imaging of the head performed on 11/13/2015 revealed old/remote LEFT cerebellar and LEFT corona radiata infarcts.  MRI imaging of the brain performed on 06/28/2016 revealed an acute RIGHT paracentral pontine nonhemorrhagic CVA.  Carotid Doppler performed on 06/29/2016 revealed less than 50% stenosis in the BILATERAL internal carotid arteries.  Patient was ultimately discharged home in stable condition with no significant residual neurological defects.  Since that time, patient has had multiple TIAs per his daughter's report.  Patient underwent diagnostic LEFT heart catheterization on 10/28/2016 revealing multivessel CAD; 95% distal LAD, 90% proximal to mid LCx, 75% mid LAD, 60% mid to distal LAD, 99% proximal to mid RCA, and 75% distal RCA.  Given the complexity and extent of his disease, patient was referred to CVTS for consultation regarding revascularization.  Patient underwent a three-vessel CABG procedure at [facility] on 11/05/2016.  LIMA-LAD, SVG-OM1, and SVG-PDA bypass grafts were placed.  Last TTE was performed on 03/19/2020 revealing a normal left ventricular systolic function with an EF of 55-60%.  There were no regional wall motion abnormalities.  Diastolic Doppler parameters normal.  Right ventricle mildly enlarged with normal function.  There was mild biatrial enlargement.  Mild to moderate mitral and tricuspid valve regurgitation noted there was no evidence of a significant transvalvular gradient to suggest stenosis.  Patient with an atrial fibrillation diagnosis; CHA2DS2-VASc Score = 6 (age x 2, HTN, CVA/TIA x 2, T2DM).patient underwent DCCV procedure on 09/25/2020 whereby he received a single 120 J cardioversion converting him to NSR.  His rate and rhythm are currently being maintained on oral  metoprolol  tartrate. He is chronically anticoagulated using apixaban.following his previous neurological events, patient is also on daily clopidogrel and is reported to be compliant  with therapy with no evidence or reports of GI bleeding. Blood pressure elevated at 158/76 despite currently prescribed currently prescribed diuretic (furosemide) and beta-blocker (metoprolol tartrate) therapies. He is on a simvastatin + icosapent ethyl for his HLD diagnosis and further ASCVD prevention. T2DM suboptimally controlled on currently prescribed regimen; last HgbA1c was 9.3% when checked on 08/20/2022.  Patient does not have an OSAH diagnosis. Functional capacity, as defined by DASI, is documented as being </= 4 METS.  No changes were made to his medication regimen.  Patient to follow-up with outpatient cardiology in 1 year or sooner if needed.  [name] is scheduled for AMPUTATION of RIGHT SECOND TOE on 09/25/2022 with Dr. Samara Deist, DPM. Given patient's past medical history significant for cardiovascular diagnoses, presurgical cardiac clearance was sought by the PAT team. Per cardiology, "this patient is optimized for surgery and may proceed with the planned procedural course with a LOW risk of significant perioperative cardiovascular complications".  In, this patient is on daily anticoagulation and antiplatelet therapies.  Cardiology has cleared patient to hold his apixaban if required by surgery. Additionally, patient is on daily clopidogrel therapy. Per Dr. Vickki Muff, patient may continue both medications throughout his perioperative course.    Patient denies previous perioperative complications with anesthesia in the past. In review of the available records, it is noted that patient underwent a general anesthetic course here at [facility] in 08/2022 without documented complications.      09/18/2022    8:33 AM 09/02/2022    9:46 AM 09/02/2022    9:36 AM  Vitals with  BMI  Height '6\' 0"'$     Weight 226 lbs    BMI 47.09    Systolic  628 366  Diastolic  77 69  Pulse  72 74    Providers/Specialists:   NOTE: Primary physician provider listed below. Patient may have been seen by APP or partner within same practice.   PROVIDER ROLE / SPECIALTY LAST Montel Culver, DPM Podiatry (Surgeon) 09/08/2022  [name], MD Primary Care Provider 07/02/2022  Serafina Royals, MD Cardiology 09/10/2022  [name], MD Vascular Surgery 07/09/2022  Lavone Orn, MD Endocrinology 08/20/2022  Gurney Maxin, MD Neurology 09/16/2022   Allergies:  Patient has no known allergies.  Current Home Medications:   No current facility-administered medications for this encounter.    clopidogrel (PLAVIX) 75 MG tablet   apixaban (ELIQUIS) 5 MG TABS tablet   calcium carbonate (TUMS - DOSED IN MG ELEMENTAL CALCIUM) 500 MG chewable tablet   cholecalciferol (VITAMIN D) 1000 UNITS tablet   docusate sodium (COLACE) 100 MG capsule   finasteride (PROSCAR) 5 MG tablet   furosemide (LASIX) 40 MG tablet   gabapentin (NEURONTIN) 100 MG capsule   icosapent Ethyl (VASCEPA) 1 g capsule   insulin aspart (NOVOLOG) 100 UNIT/ML injection   insulin glargine (LANTUS) 100 UNIT/ML injection   metFORMIN (GLUCOPHAGE) 1000 MG tablet   metoprolol tartrate (LOPRESSOR) 25 MG tablet   nortriptyline (PAMELOR) 10 MG capsule   PARoxetine (PAXIL) 40 MG tablet   rOPINIRole (REQUIP) 0.5 MG tablet   simvastatin (ZOCOR) 40 MG tablet   tamsulosin (FLOMAX) 0.4 MG CAPS capsule   TRULICITY 4.5 QH/4.7ML SOPN   vitamin B-12 (CYANOCOBALAMIN) 1000 MCG tablet   History:   Past Medical History:  Diagnosis Date  Atrial fibrillation ([place])    a.) CHA2DS2VASc = 6 (age x 2,  HTN, CVA/TIA x 2, T2DM);  b.) s/p DCCV 09/25/2020 (120 J x1); c.) rate/rhythm maintained on oral metoprolol tartrate; chronically anticoagulated with apixaban   BPH (benign prostatic hyperplasia)    CAD (coronary artery  disease) 10/28/2016   a.) LHC 10/28/2016: 95% dLAD, 90% p-mLCx, 75% mLAD, 60% m-dLAD, 99% p-mRCA, 75% dRCA --> consult CVTS; b.) 3v CABG [facility] 11/05/2016   Carotid artery disease ([place]) 06/29/2016   a.) carotid US 06/29/2016: <50% BICA   Cerebellar stroke ([place]) 11/13/2015   a.) CT imaging 11/13/2015 revealed old/remote LEFT cerebellar and LEFT carona radiata infarcts   CHF (congestive heart failure) (HCC)    Depression    Diabetic foot ulcers (HCC)    Diastolic dysfunction 56/38/7564   a.) TTE 06/28/2016: EF 55-60%, G1DD; b.) TTE 03/19/2020: EF 55-60%, mild BAE, mild-mod MR/TR.   HLD (hyperlipidemia)    Hypertension    Long term (current) use of anticoagulants    a.) apixaban   Long term current use of antithrombotics/antiplatelets    a.) clopidogrel   Neuropathy    PTSD (post-traumatic stress disorder)    Restless leg syndrome    a.) on ropinirole   Right pontine stroke ([place]) 06/28/2016   a.) MRI 06/28/2016: acute RIGHT paracentral pontine (non-hemorrhagic) CVA   S/P CABG x 3 11/05/2016   a.) LIMA-LAD, SVG-OM1, SVG-PDA   TIA (transient ischemic attack)    a.) multiple since last CVA in 2017 per daughter   Tubular adenoma of colon    Type 2 diabetes mellitus treated with insulin [facility])    Past Surgical History:  Procedure Laterality Date   AMPUTATION TOE Right 06/14/2022   Procedure: RIGHT GREAT TOE AMPUTATION;  Surgeon: Samara Deist, DPM;  Location: [facility] ORS;  Service: Podiatry;  Laterality: Right;   CARDIAC CATHETERIZATION Left 10/28/2016   Procedure: Left Heart Cath and Coronary Angiography;  Surgeon: Corey Skains, MD;  Location: [place] CV LAB;  Service: Cardiovascular;  Laterality: Left;   CARDIOVERSION N/A 09/25/2020   Procedure: CARDIOVERSION;  Surgeon: Corey Skains, MD;  Location: [facility] ORS;  Service: Cardiovascular;  Laterality: N/A;   COLONOSCOPY N/A 09/02/2022   Procedure: COLONOSCOPY;  Surgeon: Toledo, Benay Pike, MD;  Location: [facility] ENDOSCOPY;   Service: Gastroenterology;  Laterality: N/A;  IDDM   LOWER EXTREMITY ANGIOGRAPHY Right 06/16/2022   Procedure: Lower Extremity Angiography;  Surgeon: Katha Cabal, MD;  Location: [place] CV LAB;  Service: Cardiovascular;  Laterality: Right;   SHOULDER ARTHROSCOPY     TRIGGER FINGER RELEASE     UVULOPALATOPHARYNGOPLASTY, TONSILLECTOMY AND SEPTOPLASTY     Family History  Problem Relation Age of Onset   Hypertension Other    Diabetes Mellitus II Other    Diabetes Mother    Diabetes Sister    Atrial fibrillation Sister    Atrial fibrillation Father    Atrial fibrillation Daughter    Social History   Tobacco Use   Smoking status: Former    Types: Cigars   Smokeless tobacco: Never   Tobacco comments:    Very occaionally  Vaping Use   Vaping Use: Never used  Substance Use Topics   Alcohol use: No    Alcohol/week: 0.0 standard drinks of alcohol   Drug use: No    Pertinent Clinical Results:  LABS: Labs reviewed: Acceptable for surgery.  Lab Results  Component Value Date   WBC 9.3 06/17/2022   HGB 11.2 (L) 06/17/2022  HCT 34.2 (L) 06/17/2022   MCV 90.2 06/17/2022   PLT 183 06/17/2022   Lab Results  Component Value Date   NA 141 06/17/2022   K 4.0 06/17/2022   CO2 26 06/17/2022   GLUCOSE 187 (H) 06/17/2022   BUN 19 06/17/2022   CREATININE 1.18 06/17/2022   CALCIUM 9.0 06/17/2022   GFRNONAA >60 06/17/2022      ECG: Date: 09/10/2022 Rate: 84 bpm Rhythm:  Sinus rhythm with first-degree AV block with PACs in a pattern of bigeminy; RBBB Intervals: PR 212 ms. QRS 142 ms. QTc 470 ms. ST segment and T wave changes: No evidence of acute ST segment elevation or depression Comparison: Similar to previous tracing obtained on 05/05/2022 NOTE: Tracing obtained at [facility]; unable for review. Above based on cardiologist's interpretation.    IMAGING / PROCEDURES: VAS [place] ABI WITH/WO TBI performed on 07/09/2022 Right: Resting right ankle-brachial index is  within normal range. No evidence of significant right lower extremity arterial disease.  Left: Resting left ankle-brachial index is within normal range. No evidence of significant left lower extremity arterial disease. The left toe-brachial index is normal  DIAGNOSTIC RADIOGRAPHS OF RIGHT GREAT TOE performed on 06/13/2022 Subtle lucency with ill-definition of the distal cortex/tuft of the first distal phalanx which could be seen due to osteomyelitis  TRANSTHORACIC ECHOCARDIOGRAM performed on 03/19/2020 Normal left ventricular systolic function EF of 22-29% Normal left ventricular diastolic Doppler parameters Right ventricle mildly enlarged  Normal right ventricular systolic function Mild biatrial enlargement Mild to moderate MR and TR No AR or PR Normal transvalvular gradients; no valvular stenosis No pericardial effusion  CORONARY ARTERY BYPASS GRAFTING performed on 11/05/2016 Three-vessel CABG procedure LIMA-LAD SVG-OM1 SVG-PDA  LEFT HEART CATHETERIZATION AND CORONARY ANGIOGRAPHY performed on 11/03/2016 Normal left ventricular systolic function with an EF of 55% Severe multivessel CAD 95% distal LAD 90% proximal to mid LCx 75% mid LAD 60% mid to distal LAD 99% proximal to mid RCA 75% distal RCA Recommendations: consult CVTS for further evaluation regarding revascularization   Impression and Plan:  [name] has been referred for pre-anesthesia review and clearance prior to him undergoing the planned anesthetic and procedural courses. Available labs, pertinent testing, and imaging results were personally reviewed by me. This patient has been appropriately cleared by cardiology with an overall LOW risk of significant perioperative cardiovascular complications.  Based on clinical review performed today (09/24/22), barring any significant acute changes in the patient's overall condition, it is anticipated that he will be able to proceed with the planned surgical  intervention. Any acute changes in clinical condition may necessitate his procedure being postponed and/or cancelled. Patient will meet with anesthesia team (MD and/or CRNA) on the day of his procedure for preoperative evaluation/assessment. Questions regarding anesthetic course will be fielded at that time.   Pre-surgical instructions were reviewed with the patient during his PAT appointment and questions were fielded by PAT clinical staff. Patient was advised that if any questions or concerns arise prior to his procedure then he should return a call to PAT and/or his surgeon's office to discuss.  Honor Loh, MSN, APRN, FNP-C, CEN [facility]  Peri-operative Services Nurse Practitioner Phone: [phone] Fax: [phone] 09/24/22 1:45 PM  NOTE: This note has been prepared using Dragon dictation software. Despite my best ability to proofread, there is always the potential that unintentional transcriptional errors may still occur from this process.

## 2022-09-24 ENCOUNTER — Encounter: Payer: Self-pay | Admitting: Podiatry

## 2022-09-24 MED ORDER — FAMOTIDINE 20 MG PO TABS: 20.0000 mg | ORAL_TABLET | Freq: Once | ORAL | Status: AC

## 2022-09-24 MED ORDER — SODIUM CHLORIDE 0.9 % IV SOLN: INTRAVENOUS | Status: DC

## 2022-09-24 MED ORDER — CHLORHEXIDINE GLUCONATE 0.12 % MT SOLN: 15.0000 mL | Freq: Once | OROMUCOSAL | Status: AC

## 2022-09-24 MED ORDER — ORAL CARE MOUTH RINSE: 15.0000 mL | Freq: Once | OROMUCOSAL | Status: AC

## 2022-09-24 MED ORDER — CEFAZOLIN SODIUM-DEXTROSE 2-4 GM/100ML-% IV SOLN
2.0000 g | INTRAVENOUS | Status: AC
  Administered 2022-09-25: 2 g via INTRAVENOUS

## 2022-09-25 ENCOUNTER — Ambulatory Visit: Payer: Medicare Other | Admitting: Urgent Care

## 2022-09-25 ENCOUNTER — Ambulatory Visit
Admission: RE | Admit: 2022-09-25 | Discharge: 2022-09-25 | Disposition: A | Discharge status: Home / Self Care | Payer: Medicare Other | Attending: Podiatry | Admitting: Podiatry

## 2022-09-25 ENCOUNTER — Encounter: Payer: Self-pay | Admitting: Podiatry

## 2022-09-25 ENCOUNTER — Other Ambulatory Visit: Payer: Self-pay

## 2022-09-25 ENCOUNTER — Encounter
Admission: RE | Disposition: A | Discharge status: Home / Self Care | Payer: Self-pay | Source: Home / Self Care | Attending: Podiatry

## 2022-09-25 DIAGNOSIS — E1169 Type 2 diabetes mellitus with other specified complication: Secondary | ICD-10-CM | POA: Diagnosis not present

## 2022-09-25 DIAGNOSIS — I110 Hypertensive heart disease with heart failure: Secondary | ICD-10-CM | POA: Insufficient documentation

## 2022-09-25 DIAGNOSIS — Z8673 Personal history of transient ischemic attack (TIA), and cerebral infarction without residual deficits: Secondary | ICD-10-CM | POA: Diagnosis not present

## 2022-09-25 DIAGNOSIS — E1142 Type 2 diabetes mellitus with diabetic polyneuropathy: Secondary | ICD-10-CM | POA: Diagnosis present

## 2022-09-25 DIAGNOSIS — F32A Depression, unspecified: Secondary | ICD-10-CM | POA: Insufficient documentation

## 2022-09-25 DIAGNOSIS — I4891 Unspecified atrial fibrillation: Secondary | ICD-10-CM | POA: Insufficient documentation

## 2022-09-25 DIAGNOSIS — N400 Enlarged prostate without lower urinary tract symptoms: Secondary | ICD-10-CM | POA: Insufficient documentation

## 2022-09-25 DIAGNOSIS — E1152 Type 2 diabetes mellitus with diabetic peripheral angiopathy with gangrene: Secondary | ICD-10-CM | POA: Insufficient documentation

## 2022-09-25 DIAGNOSIS — M86171 Other acute osteomyelitis, right ankle and foot: Secondary | ICD-10-CM | POA: Diagnosis not present

## 2022-09-25 DIAGNOSIS — E785 Hyperlipidemia, unspecified: Secondary | ICD-10-CM | POA: Insufficient documentation

## 2022-09-25 DIAGNOSIS — I5030 Unspecified diastolic (congestive) heart failure: Secondary | ICD-10-CM | POA: Diagnosis not present

## 2022-09-25 DIAGNOSIS — I2510 Atherosclerotic heart disease of native coronary artery without angina pectoris: Secondary | ICD-10-CM | POA: Insufficient documentation

## 2022-09-25 DIAGNOSIS — G2581 Restless legs syndrome: Secondary | ICD-10-CM | POA: Insufficient documentation

## 2022-09-25 DIAGNOSIS — Z951 Presence of aortocoronary bypass graft: Secondary | ICD-10-CM | POA: Insufficient documentation

## 2022-09-25 DIAGNOSIS — E11621 Type 2 diabetes mellitus with foot ulcer: Secondary | ICD-10-CM | POA: Diagnosis not present

## 2022-09-25 DIAGNOSIS — Z7901 Long term (current) use of anticoagulants: Secondary | ICD-10-CM | POA: Insufficient documentation

## 2022-09-25 DIAGNOSIS — E1159 Type 2 diabetes mellitus with other circulatory complications: Secondary | ICD-10-CM

## 2022-09-25 DIAGNOSIS — Z87891 Personal history of nicotine dependence: Secondary | ICD-10-CM | POA: Diagnosis not present

## 2022-09-25 DIAGNOSIS — L98496 Non-pressure chronic ulcer of skin of other sites with bone involvement without evidence of necrosis: Secondary | ICD-10-CM | POA: Diagnosis not present

## 2022-09-25 DIAGNOSIS — F4310 Post-traumatic stress disorder, unspecified: Secondary | ICD-10-CM | POA: Diagnosis not present

## 2022-09-25 HISTORY — DX: Long term (current) use of insulin: E11.9

## 2022-09-25 HISTORY — PX: AMPUTATION TOE: SHX6595

## 2022-09-25 HISTORY — DX: Long term (current) use of anticoagulants: Z79.01

## 2022-09-25 HISTORY — DX: Benign neoplasm of colon, unspecified: D12.6

## 2022-09-25 HISTORY — DX: Transient cerebral ischemic attack, unspecified: G45.9

## 2022-09-25 HISTORY — DX: Type 2 diabetes mellitus without complications: Z79.4

## 2022-09-25 HISTORY — DX: Long term (current) use of antithrombotics/antiplatelets: Z79.02

## 2022-09-25 LAB — GLUCOSE, CAPILLARY
Glucose-Capillary: 213 mg/dL — ABNORMAL HIGH (ref 70–99)
Glucose-Capillary: 218 mg/dL — ABNORMAL HIGH (ref 70–99)

## 2022-09-25 SURGERY — AMPUTATION, TOE
Anesthesia: General | Site: Toe | Laterality: Right

## 2022-09-25 MED ORDER — METOCLOPRAMIDE HCL 5 MG/ML IJ SOLN: 5.0000 mg | Freq: Three times a day (TID) | INTRAMUSCULAR | Status: DC | PRN

## 2022-09-25 MED ORDER — FENTANYL CITRATE (PF) 100 MCG/2ML IJ SOLN: 25.0000 ug | INTRAMUSCULAR | Status: DC | PRN

## 2022-09-25 MED ORDER — GLYCOPYRROLATE 0.2 MG/ML IJ SOLN
INTRAMUSCULAR | Status: AC
  Filled 2022-09-25: qty 1

## 2022-09-25 MED ORDER — LIDOCAINE HCL (PF) 2 % IJ SOLN
INTRAMUSCULAR | Status: AC
  Filled 2022-09-25: qty 5

## 2022-09-25 MED ORDER — CEFAZOLIN SODIUM-DEXTROSE 2-4 GM/100ML-% IV SOLN
INTRAVENOUS | Status: AC
  Filled 2022-09-25: qty 100

## 2022-09-25 MED ORDER — PROPOFOL 1000 MG/100ML IV EMUL
INTRAVENOUS | Status: AC
  Filled 2022-09-25: qty 100

## 2022-09-25 MED ORDER — LIDOCAINE HCL (PF) 1 % IJ SOLN
INTRAMUSCULAR | Status: AC
  Filled 2022-09-25: qty 30

## 2022-09-25 MED ORDER — PROPOFOL 10 MG/ML IV BOLUS
INTRAVENOUS | Status: DC | PRN
  Administered 2022-09-25: 40 mg via INTRAVENOUS

## 2022-09-25 MED ORDER — FENTANYL CITRATE (PF) 100 MCG/2ML IJ SOLN
INTRAMUSCULAR | Status: DC | PRN
  Administered 2022-09-25: 50 ug via INTRAVENOUS
  Administered 2022-09-25 (×2): 25 ug via INTRAVENOUS

## 2022-09-25 MED ORDER — ONDANSETRON HCL 4 MG PO TABS: 4.0000 mg | ORAL_TABLET | Freq: Four times a day (QID) | ORAL | Status: DC | PRN

## 2022-09-25 MED ORDER — BUPIVACAINE HCL 0.5 % IJ SOLN
INTRAMUSCULAR | Status: DC | PRN
  Administered 2022-09-25: 5 mL

## 2022-09-25 MED ORDER — BUPIVACAINE HCL (PF) 0.5 % IJ SOLN
INTRAMUSCULAR | Status: AC
  Filled 2022-09-25: qty 30

## 2022-09-25 MED ORDER — FENTANYL CITRATE (PF) 100 MCG/2ML IJ SOLN
INTRAMUSCULAR | Status: AC
  Filled 2022-09-25: qty 2

## 2022-09-25 MED ORDER — PROPOFOL 500 MG/50ML IV EMUL
INTRAVENOUS | Status: DC | PRN
  Administered 2022-09-25: 50 ug/kg/min via INTRAVENOUS

## 2022-09-25 MED ORDER — METOCLOPRAMIDE HCL 10 MG PO TABS: 5.0000 mg | ORAL_TABLET | Freq: Three times a day (TID) | ORAL | Status: DC | PRN

## 2022-09-25 MED ORDER — FAMOTIDINE 20 MG PO TABS
ORAL_TABLET | ORAL | Status: AC
  Administered 2022-09-25: 20 mg via ORAL
  Filled 2022-09-25: qty 1

## 2022-09-25 MED ORDER — LIDOCAINE 2% (20 MG/ML) 5 ML SYRINGE
INTRAMUSCULAR | Status: DC | PRN
  Administered 2022-09-25: 20 mg via INTRAVENOUS

## 2022-09-25 MED ORDER — CHLORHEXIDINE GLUCONATE 0.12 % MT SOLN
OROMUCOSAL | Status: AC
  Administered 2022-09-25: 15 mL via OROMUCOSAL
  Filled 2022-09-25: qty 15

## 2022-09-25 MED ORDER — ONDANSETRON HCL 4 MG/2ML IJ SOLN: 4.0000 mg | Freq: Once | INTRAMUSCULAR | Status: DC | PRN

## 2022-09-25 MED ORDER — ONDANSETRON HCL 4 MG/2ML IJ SOLN: 4.0000 mg | Freq: Four times a day (QID) | INTRAMUSCULAR | Status: DC | PRN

## 2022-09-25 MED ORDER — PHENYLEPHRINE 80 MCG/ML (10ML) SYRINGE FOR IV PUSH (FOR BLOOD PRESSURE SUPPORT)
PREFILLED_SYRINGE | INTRAVENOUS | Status: AC
  Filled 2022-09-25: qty 10

## 2022-09-25 SURGICAL SUPPLY — 40 items
BLADE OSC/SAGITTAL MD 5.5X18 (BLADE) ×1 IMPLANT
BLADE SURG MINI STRL (BLADE) ×1 IMPLANT
BNDG CMPR 75X21 PLY HI ABS (MISCELLANEOUS) ×1
BNDG CMPR STD VLCR NS LF 5.8X4 (GAUZE/BANDAGES/DRESSINGS) ×1
BNDG ELASTIC 4X5.8 VLCR NS LF (GAUZE/BANDAGES/DRESSINGS) ×1 IMPLANT
BNDG ESMARK 4X12 TAN STRL LF (GAUZE/BANDAGES/DRESSINGS) ×1 IMPLANT
BNDG GAUZE DERMACEA FLUFF 4 (GAUZE/BANDAGES/DRESSINGS) ×1 IMPLANT
BNDG GZE 12X3 1 PLY HI ABS (GAUZE/BANDAGES/DRESSINGS) ×2
BNDG GZE DERMACEA 4 6PLY (GAUZE/BANDAGES/DRESSINGS) ×1
BNDG STRETCH GAUZE 3IN X12FT (GAUZE/BANDAGES/DRESSINGS) ×2 IMPLANT
DURAPREP 26ML APPLICATOR (WOUND CARE) ×1 IMPLANT
ELECT REM PT RETURN 9FT ADLT (ELECTROSURGICAL) ×1
ELECTRODE REM PT RTRN 9FT ADLT (ELECTROSURGICAL) ×1 IMPLANT
GAUZE PACKING IODOFORM 1/2INX (GAUZE/BANDAGES/DRESSINGS) ×1 IMPLANT
GAUZE SPONGE 4X4 12PLY STRL (GAUZE/BANDAGES/DRESSINGS) ×1 IMPLANT
GAUZE STRETCH 2X75IN STRL (MISCELLANEOUS) ×1 IMPLANT
GAUZE XEROFORM 1X8 LF (GAUZE/BANDAGES/DRESSINGS) ×1 IMPLANT
GLOVE BIO SURGEON STRL SZ7.5 (GLOVE) ×1 IMPLANT
GLOVE SURG UNDER LTX SZ8 (GLOVE) ×1 IMPLANT
GOWN STRL REUS W/ TWL XL LVL3 (GOWN DISPOSABLE) ×2 IMPLANT
GOWN STRL REUS W/TWL XL LVL3 (GOWN DISPOSABLE) ×2
IV NS IRRIG 3000ML ARTHROMATIC (IV SOLUTION) ×1 IMPLANT
KIT TURNOVER KIT A (KITS) ×1 IMPLANT
MANIFOLD NEPTUNE II (INSTRUMENTS) ×1 IMPLANT
NDL FILTER BLUNT 18X1 1/2 (NEEDLE) ×1 IMPLANT
NDL HYPO 25X1 1.5 SAFETY (NEEDLE) ×1 IMPLANT
NEEDLE FILTER BLUNT 18X1 1/2 (NEEDLE) ×1 IMPLANT
NEEDLE HYPO 25X1 1.5 SAFETY (NEEDLE) ×1 IMPLANT
NS IRRIG 500ML POUR BTL (IV SOLUTION) ×1 IMPLANT
PACK EXTREMITY ARMC (MISCELLANEOUS) ×1 IMPLANT
PACKING GAUZE IODOFORM 1/2INX5 (GAUZE/BANDAGES/DRESSINGS) ×1
PAD ABD DERMACEA PRESS 5X9 (GAUZE/BANDAGES/DRESSINGS) ×2 IMPLANT
STOCKINETTE M/LG 89821 (MISCELLANEOUS) ×1 IMPLANT
STRAP SAFETY 5IN WIDE (MISCELLANEOUS) ×1 IMPLANT
SUT ETHILON 3-0 FS-10 30 BLK (SUTURE) ×1
SUT ETHILON 5-0 FS-2 18 BLK (SUTURE) ×1 IMPLANT
SUT VIC AB 4-0 FS2 27 (SUTURE) ×1 IMPLANT
SUTURE EHLN 3-0 FS-10 30 BLK (SUTURE) ×1 IMPLANT
SYR 10ML LL (SYRINGE) ×3 IMPLANT
TRAP FLUID SMOKE EVACUATOR (MISCELLANEOUS) ×1 IMPLANT

## 2022-09-25 NOTE — Discharge Instructions (Addendum)
[facility]  POST OPERATIVE INSTRUCTIONS FOR DR. FOWLER AND [facility] PODIAT[facility]PART[facility]   Take your medication as prescribed.  Pain medication should be taken only as needed.  Keep the dressing clean, dry and intact.  Keep your foot elevated above the heart level for the first 48 hours.  Walking to the bathroom and brief periods of walking are acceptable, unless we have instructed you to be non-weight bearing.  Always wear your post-op shoe when walking.  Always use your crutches if you are to be non-weight bearing.  Do not take a shower. Baths are permissible as long as the foot is kept out of the water.   Every hour you are awake:  Bend your knee 15 times. Flex foot 15 times Massage calf 15 times  Call [facility] ([phone]) if any of the following problems occur: You develop a temperature or fever. The bandage becomes saturated with blood. Medication does not stop your pain. Injury of the foot occurs. Any symptoms of infection including redness, odor, or red streaks running from wound.    AMBULATORY SURGERY  DISCHARGE INSTRUCTIONS   The drugs that you were given will stay in your system until tomorrow so for the next 24 hours you should not:  Drive an automobile Make any legal decisions Drink any alcoholic beverage   You may resume regular meals tomorrow.  Today it is better to start with liquids and gradually work up to solid foods.  You may eat anything you prefer, but it is better to start with liquids, then soup and crackers, and gradually work up to solid foods.   Please notify your doctor immediately if you have any unusual bleeding, trouble breathing, redness and pain at the surgery site, drainage, fever, or pain not relieved by medication.    Additional Instructions:        Please contact your physician with any problems or Same Day Surgery at [phone], Monday through  Friday 6 am to 4 pm, or [place] [facility] [place] Main number at [phone]. 

## 2022-09-25 NOTE — Anesthesia Preprocedure Evaluation (Signed)
Anesthesia Evaluation  Patient identified by MRN, date of birth, ID band Patient awake    Reviewed: Allergy & Precautions, H&P , NPO status , Patient's Chart, lab work & pertinent test results, reviewed documented beta blocker date and time   Airway Mallampati: II   Neck ROM: full    Dental  (+) Poor Dentition   Pulmonary neg pulmonary ROS, former smoker,    Pulmonary exam normal        Cardiovascular Exercise Tolerance: Poor hypertension, On Medications + angina with exertion + CAD, + Peripheral Vascular Disease and +CHF  (-) Orthopnea and (-) PND Normal cardiovascular exam Rhythm:regular Rate:Normal     Neuro/Psych  Headaches, Anxiety Depression TIA Neuromuscular disease CVA negative psych ROS   GI/Hepatic negative GI ROS, Neg liver ROS,   Endo/Other  negative endocrine ROSdiabetes  Renal/GU CRFRenal disease  negative genitourinary   Musculoskeletal   Abdominal   Peds  Hematology negative hematology ROS (+)   Anesthesia Other Findings Past Medical History: No date: Atrial fibrillation (HCC)     Comment:  a.) CHA2DS2VASc = 6 (age x 2,  HTN, CVA/TIA x 2, T2DM);               b.) s/p DCCV 09/25/2020 (120 J x1); c.) rate/rhythm               maintained on oral metoprolol tartrate; chronically               anticoagulated with apixaban No date: BPH (benign prostatic hyperplasia) 10/28/2016: CAD (coronary artery disease)     Comment:  a.) LHC 10/28/2016: 95% dLAD, 90% p-mLCx, 75% mLAD, 60%               m-dLAD, 99% p-mRCA, 75% dRCA --> consult CVTS; b.) 3v               CABG at [facility] 11/05/2016 06/29/2016: Carotid artery disease ([place])     Comment:  a.) carotid US 06/29/2016: <50% BICA 11/13/2015: Cerebellar stroke (HCC)     Comment:  a.) CT imaging 11/13/2015 revealed old/remote LEFT               cerebellar and LEFT carona radiata infarcts No date: CHF (congestive heart failure) (HCC) No date: Depression No  date: Diabetic foot ulcers ([place]) 73/71/0626: Diastolic dysfunction     Comment:  a.) TTE 06/28/2016: EF 55-60%, G1DD; b.) TTE 03/19/2020:              EF 55-60%, mild BAE, mild-mod MR/TR. No date: HLD (hyperlipidemia) No date: Hypertension No date: Long term (current) use of anticoagulants     Comment:  a.) apixaban No date: Long term current use of antithrombotics/antiplatelets     Comment:  a.) clopidogrel No date: Neuropathy No date: PTSD (post-traumatic stress disorder) No date: Restless leg syndrome     Comment:  a.) on ropinirole 06/28/2016: Right pontine stroke ([place])     Comment:  a.) MRI 06/28/2016: acute RIGHT paracentral pontine               (non-hemorrhagic) CVA 11/05/2016: S/P CABG x 3     Comment:  a.) LIMA-LAD, SVG-OM1, SVG-PDA No date: TIA (transient ischemic attack)     Comment:  a.) multiple since last CVA in 2017 per daughter No date: Tubular adenoma of colon No date: Type 2 diabetes mellitus treated with insulin [facility]) Past Surgical History: 06/14/2022: AMPUTATION TOE; Right     Comment:  Procedure: RIGHT GREAT TOE AMPUTATION;  Surgeon: Samara Deist, DPM;  Location: [facility] ORS;  Service: Podiatry;                Laterality: Right; 10/28/2016: CARDIAC CATHETERIZATION; Left     Comment:  Procedure: Left Heart Cath and Coronary Angiography;                Surgeon: Corey Skains, MD;  Location: [place]               CV LAB;  Service: Cardiovascular;  Laterality: Left; 09/25/2020: CARDIOVERSION; N/A     Comment:  Procedure: CARDIOVERSION;  Surgeon: Corey Skains,               MD;  Location: [facility] ORS;  Service: Cardiovascular;                Laterality: N/A; 09/02/2022: COLONOSCOPY; N/A     Comment:  Procedure: COLONOSCOPY;  Surgeon: Toledo, Benay Pike, MD;              Location: [facility] ENDOSCOPY;  Service: Gastroenterology;                Laterality: N/A;  IDDM 06/16/2022: LOWER EXTREMITY ANGIOGRAPHY; Right     Comment:  Procedure: Lower  Extremity Angiography;  Surgeon:               Katha Cabal, MD;  Location: [place] CV LAB;               Service: Cardiovascular;  Laterality: Right; No date: SHOULDER ARTHROSCOPY No date: TRIGGER FINGER RELEASE No date: UVULOPALATOPHARYNGOPLASTY, TONSILLECTOMY AND SEPTOPLASTY BMI    Body Mass Index: 30.65 kg/m     Reproductive/Obstetrics negative OB ROS                             Anesthesia Physical Anesthesia Plan  ASA: 4  Anesthesia Plan: General   Post-op Pain Management:    Induction:   PONV Risk Score and Plan:   Airway Management Planned:   Additional Equipment:   Intra-op Plan:   Post-operative Plan:   Informed Consent: I have reviewed the patients History and Physical, chart, labs and discussed the procedure including the risks, benefits and alternatives for the proposed anesthesia with the patient or authorized representative who has indicated his/her understanding and acceptance.     Dental Advisory Given  Plan Discussed with: CRNA  Anesthesia Plan Comments:         Anesthesia Quick Evaluation

## 2022-09-25 NOTE — Op Note (Signed)
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  Local consisted of a total of 5 cc of 0.5% bupivacaine    Hemostasis: None    Specimen: Amputated right second toe for pathology and wound culture    Complications: None    Operative indications:[name] is an 76 y.o. that presents today for surgical intervention.  The risks/benefits/alternatives/complications have been discussed and consent has been given.    Procedure:  Patient was brought into the OR and placed on the operating table in thesupine position. After anesthesia was obtained theright lower extremity was prepped and draped in usual sterile fashion.  Attention was directed to the right second toe at the level of the midshaft of the proximal phalanx to full-thickness flaps were created medial and lateral.  Full-thickness dissection carried Down to the Bone.  At This Time a Osteotomy Was Created at the [facility] of the Proximal Phalanx.  The Toe Was Then Removed from the Surgical Field.  Bleeders Were Bovie Cauterized As Appropriate.  The Wound Was Flushed with Copious Amounts of Irrigation.  Closure Was Then Performed with a 3-0 Nylon.  A Bulky Sterile Dressing Was Applied.    Patient tolerated the procedure and anesthesia well.  Was transported from the OR to the PACU with all vital signs stable and vascular status intact. To be discharged per routine protocol.  Will follow up in approximately 1 week in the outpatient clinic.

## 2022-09-25 NOTE — Transfer of Care (Signed)
Immediate Anesthesia Transfer of Care Note  Patient: [name]  Procedure(s) Performed: AMPUTATION TOE - SECOND (Right: Toe)  Patient Location: PACU  Anesthesia Type:General  Level of Consciousness: drowsy  Airway & Oxygen Therapy: Patient Spontanous Breathing  Post-op Assessment: Report given to RN and Post -op Vital signs reviewed and stable  Post vital signs: Reviewed  Last Vitals:  Vitals Value Taken Time  BP    Temp    Pulse 75 09/25/22 0809  Resp    SpO2 91 % 09/25/22 0809  Vitals shown include unvalidated device data.  Last Pain:  Vitals:   09/25/22 0619  TempSrc: Temporal  PainSc: 0-No pain         Complications: No notable events documented.

## 2022-09-25 NOTE — H&P (Signed)
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  The various methods of treatment have been discussed with the patient.  No guarantees were given.  After consideration of risks, benefits and other options for treatment, the patient has consented to surgery.  I have reviewed the patients' chart and labs.     A history and physical examination was performed in my office.  The patient was reexamined.  There have been no changes to this history and physical examination.  [name] A

## 2022-09-28 LAB — SURGICAL PATHOLOGY

## 2022-09-30 LAB — AEROBIC/ANAEROBIC CULTURE W GRAM STAIN (SURGICAL/DEEP WOUND): Gram Stain: NONE SEEN

## 2022-10-05 NOTE — Anesthesia Postprocedure Evaluation (Signed)
Anesthesia Post Note  Patient: [name]  Procedure(s) Performed: AMPUTATION TOE - SECOND (Right: Toe)  Patient location during evaluation: PACU Anesthesia Type: General Level of consciousness: awake and alert Pain management: pain level controlled Vital Signs Assessment: post-procedure vital signs reviewed and stable Respiratory status: spontaneous breathing, nonlabored ventilation, respiratory function stable and patient connected to nasal cannula oxygen Cardiovascular status: blood pressure returned to baseline and stable Postop Assessment: no apparent nausea or vomiting Anesthetic complications: no   No notable events documented.   Last Vitals:  Vitals:   09/25/22 0830 09/25/22 0837  BP: (!) 167/91 (!) 162/89  Pulse: 74 73  Resp: 15 (!) 21  Temp:    SpO2: 94% 94%    Last Pain:  Vitals:   09/26/22 1648  TempSrc:   PainSc: 0-No pain                 Molli Barrows

## 2022-10-07 ENCOUNTER — Ambulatory Visit (INDEPENDENT_AMBULATORY_CARE_PROVIDER_SITE_OTHER): Payer: Medicare Other

## 2022-10-07 ENCOUNTER — Ambulatory Visit (INDEPENDENT_AMBULATORY_CARE_PROVIDER_SITE_OTHER): Payer: Medicare Other | Admitting: Nurse Practitioner

## 2022-10-07 ENCOUNTER — Encounter (INDEPENDENT_AMBULATORY_CARE_PROVIDER_SITE_OTHER): Payer: Self-pay | Admitting: Nurse Practitioner

## 2022-10-07 VITALS — BP 122/62 | HR 76 | Resp 16

## 2022-10-07 DIAGNOSIS — I7025 Atherosclerosis of native arteries of other extremities with ulceration: Secondary | ICD-10-CM

## 2022-10-07 DIAGNOSIS — I10 Essential (primary) hypertension: Secondary | ICD-10-CM

## 2022-10-07 DIAGNOSIS — E1159 Type 2 diabetes mellitus with other circulatory complications: Secondary | ICD-10-CM

## 2022-10-12 ENCOUNTER — Encounter (INDEPENDENT_AMBULATORY_CARE_PROVIDER_SITE_OTHER): Payer: Self-pay | Admitting: Nurse Practitioner

## 2022-10-12 NOTE — Progress Notes (Signed)
Subjective:    Patient ID: [name], male    DOB: 1946-08-24, 76 y.o.   MRN: [id number] Chief Complaint  Patient presents with   Follow-up    Ultrasound follow up    The patient returns to the office for followup and review of the noninvasive studies.   There have been no interval changes in lower extremity symptoms. No interval shortening of the patient's claudication distance or development of rest pain symptoms. No new ulcers or wounds have occurred since the last visit.  There have been no significant changes to the patient's overall health care.  The patient denies amaurosis fugax or recent TIA symptoms. There are no documented recent neurological changes noted. There is no history of DVT, PE or superficial thrombophlebitis. The patient denies recent episodes of angina or shortness of breath.   ABI Rt=0.92 and Lt=1.05  (previous ABI's Rt=0.97 and Lt=1.23) Waveforms throughout the right lower extremity are primarily biphasic/triphasic with stenosis within the proximal peroneal.  ABIs revealed biphasic tibial artery waveforms bilaterally.    Review of Systems  Musculoskeletal:  Positive for gait problem.  Skin:  Positive for wound.  All other systems reviewed and are negative.      Objective:   Physical Exam Vitals reviewed.  HENT:     Head: Normocephalic.  Cardiovascular:     Rate and Rhythm: Normal rate.     Pulses:          Dorsalis pedis pulses are detected w/ Doppler on the right side and detected w/ Doppler on the left side.       Posterior tibial pulses are detected w/ Doppler on the right side and detected w/ Doppler on the left side.  Pulmonary:     Effort: Pulmonary effort is normal.  Skin:    General: Skin is warm and dry.  Neurological:     Mental Status: He is alert and oriented to person, place, and time.     Motor: Weakness present.     Gait: Gait abnormal.  Psychiatric:        Mood and Affect: Mood normal.        Behavior: Behavior  normal.        Thought Content: Thought content normal.        Judgment: Judgment normal.     BP 122/62 (BP Location: Right Arm)   Pulse 76   Resp 16   Past Medical History:  Diagnosis Date   Atrial fibrillation (HCC)    a.) CHA2DS2VASc = 6 (age x 2,  HTN, CVA/TIA x 2, T2DM);  b.) s/p DCCV 09/25/2020 (120 J x1); c.) rate/rhythm maintained on oral metoprolol tartrate; chronically anticoagulated with apixaban   BPH (benign prostatic hyperplasia)    CAD (coronary artery disease) 10/28/2016   a.) LHC 10/28/2016: 95% dLAD, 90% p-mLCx, 75% mLAD, 60% m-dLAD, 99% p-mRCA, 75% dRCA --> consult CVTS; b.) 3v CABG at [facility] 11/05/2016   Carotid artery disease ([place]) 06/29/2016   a.) carotid US 06/29/2016: <50% BICA   Cerebellar stroke ([place]) 11/13/2015   a.) CT imaging 11/13/2015 revealed old/remote LEFT cerebellar and LEFT carona radiata infarcts   CHF (congestive heart failure) (HCC)    Depression    Diabetic foot ulcers (HCC)    Diastolic dysfunction 66/29/4765   a.) TTE 06/28/2016: EF 55-60%, G1DD; b.) TTE 03/19/2020: EF 55-60%, mild BAE, mild-mod MR/TR.   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   a.) LIMA-LAD, SVG-OM1, SVG-PDA   TIA (transient ischemic attack)    a.) multiple since last CVA in 2017 per daughter   Tubular adenoma of colon    Type 2 diabetes mellitus treated with insulin ([place])     Social History   Socioeconomic History   Marital status: Widowed    Spouse name: Not on file   Number of children: 2   Years of education: Not on file   Highest education level: Some college, no degree   Occupational History    Comment: retired  Tobacco Use   Smoking status: Former    Types: Cigars   Smokeless tobacco: Never   Tobacco comments:    Very Hotel manager Use   Vaping Use: Never used  Substance and Sexual Activity   Alcohol use: No    Alcohol/week: 0.0 standard drinks of alcohol   Drug use: No   Sexual activity: Not Currently  Other Topics Concern   Not on file  Social History Narrative   Not on file   Social Determinants of Health   Financial Resource Strain: Low Risk  (03/01/2018)   Overall Financial Resource Strain (CARDIA)    Difficulty of Paying Living Expenses: Not hard at all  Food Insecurity: No Food Insecurity (03/01/2018)   Hunger Vital Sign    Worried About Running Out of Food in the Last Year: Never true    [place] in the Last Year: Never true  Transportation Needs: No Transportation Needs (03/01/2018)   PRAPARE - Hydrologist (Medical): No    Lack of Transportation (Non-Medical): No  Physical Activity: Inactive (03/01/2018)   Exercise Vital Sign    Days of Exercise per Week: 0 days    Minutes of Exercise per Session: 0 min  Stress: No Stress Concern Present (03/01/2018)   [place]    Feeling of Stress : Not at all  Social Connections: Unknown (03/01/2018)   Social Connection and Isolation Panel [NHANES]    Frequency of Communication with Friends and Family: Not on file    Frequency of Social Gatherings with Friends and Family: Not on file    Attends Religious Services: Never    Active Member of Clubs or Organizations: No    Attends Archivist Meetings: Never    Marital Status: Widowed  Intimate Partner Violence: Not At Risk (03/01/2018)   Humiliation, Afraid, Rape, and Kick questionnaire    Fear of Current or Ex-Partner: No    Emotionally Abused: No    Physically Abused: No    Sexually Abused: No    Past Surgical History:   Procedure Laterality Date   AMPUTATION TOE Right 06/14/2022   Procedure: RIGHT GREAT TOE AMPUTATION;  Surgeon: Samara Deist, DPM;  Location: [facility];  Service: Podiatry;  Laterality: Right;   AMPUTATION TOE Right 09/25/2022   Procedure: AMPUTATION TOE - SECOND;  Surgeon: Samara Deist, DPM;  Location: [facility];  Service: Podiatry;  Laterality: Right;   CARDIAC CATHETERIZATION Left 10/28/2016   Procedure: Left Heart Cath and Coronary Angiography;  Surgeon: Corey Skains,  MD;  Location: [facility];  Service: Cardiovascular;  Laterality: Left;   CARDIOVERSION N/A 09/25/2020   Procedure: CARDIOVERSION;  Surgeon: Corey Skains, MD;  Location: [facility];  Service: Cardiovascular;  Laterality: N/A;   COLONOSCOPY N/A 09/02/2022   Procedure: COLONOSCOPY;  Surgeon: Toledo, Benay Pike, MD;  Location: [facility];  Service: Gastroenterology;  Laterality: N/A;  IDDM   LOWER EXTREMITY ANGIOGRAPHY Right 06/16/2022   Procedure: Lower Extremity Angiography;  Surgeon: Katha Cabal, MD;  Location: [facility];  Service: Cardiovascular;  Laterality: Right;   SHOULDER ARTHROSCOPY     TRIGGER FINGER RELEASE     UVULOPALATOPHARYNGOPLASTY, TONSILLECTOMY AND SEPTOPLASTY      Family History  Problem Relation Age of Onset   Hypertension Other    Diabetes Mellitus II Other    Diabetes Mother    Diabetes Sister    Atrial fibrillation Sister    Atrial fibrillation Father    Atrial fibrillation Daughter     No Known Allergies     Latest Ref Rng & Units 06/17/2022    3:41 AM 06/16/2022    3:26 AM 06/15/2022    4:48 AM  CBC  WBC 4.0 - 10.5 K/uL 9.3  9.9  8.4   Hemoglobin 13.0 - 17.0 g/dL 11.2  11.8  11.7   Hematocrit 39.0 - 52.0 % 34.2  35.8  35.2   Platelets 150 - 400 K/uL 183  197  186       CMP     Component Value Date/Time   NA 141 06/17/2022 0341   K 4.0 06/17/2022 0341   CL 109 06/17/2022 0341   CO2 26 06/17/2022 0341   GLUCOSE 187 (H) 06/17/2022 0341    BUN 19 06/17/2022 0341   CREATININE 1.18 06/17/2022 0341   CALCIUM 9.0 06/17/2022 0341   PROT 7.1 06/13/2022 0747   ALBUMIN 3.8 06/13/2022 0747   AST 20 06/13/2022 0747   ALT 19 06/13/2022 0747   ALKPHOS 63 06/13/2022 0747   BILITOT 0.5 06/13/2022 0747   GFRNONAA >60 06/17/2022 0341   GFRAA >60 03/19/2020 0550     No results found.     Assessment & Plan:   1. Atherosclerosis of native arteries of the extremities with ulceration [facility]) The patient is continuing to heal well while working with podiatry.  We will continue to maintain close follow-up while the patient is healing.  We will have him return in 3 months with noninvasive studies.  2. Hypertension, essential Continue antihypertensive medications as already ordered, these medications have been reviewed and there are no changes at this time.   3. Type 2 diabetes mellitus with vascular disease ([place]) Continue hypoglycemic medications as already ordered, these medications have been reviewed and there are no changes at this time.  Hgb A1C to be monitored as already arranged by primary service    Current Outpatient Medications on File Prior to Visit  Medication Sig Dispense Refill   apixaban (ELIQUIS) 5 MG TABS tablet Take 1 tablet (5 mg total) by mouth 2 (two) times daily. 60 tablet 0   calcium carbonate (TUMS - DOSED IN MG ELEMENTAL CALCIUM) 500 MG chewable tablet Chew 1 tablet by mouth daily as needed for indigestion or heartburn.     cholecalciferol (VITAMIN D) 1000 UNITS tablet Take 1,000 Units by mouth daily.     clopidogrel (PLAVIX) 75 MG tablet Take 75 mg by mouth daily.     docusate sodium (COLACE) 100 MG capsule Take 100 mg  by mouth 2 (two) times daily.     finasteride (PROSCAR) 5 MG tablet Take 5 mg by mouth daily.     furosemide (LASIX) 40 MG tablet Take 20 mg by mouth 2 (two) times daily.     gabapentin (NEURONTIN) 100 MG capsule Take 100 mg by mouth 3 (three) times daily.     icosapent Ethyl (VASCEPA) 1 g capsule  Take 2 g by mouth 2 (two) times daily.      insulin aspart (NOVOLOG) 100 UNIT/ML injection Inject 20 Units into the skin 3 (three) times daily before meals. (Patient taking differently: Inject 20-25 Units into the skin See admin instructions. Inject 30 units before breakfast, 30 units before lunch, and 35 units before supper) 10 mL 0   insulin glargine (LANTUS) 100 UNIT/ML injection Inject 0.9 mLs (90 Units total) into the skin at bedtime. Home med. (Patient taking differently: Inject 100 Units into the skin at bedtime. Home med.)     metFORMIN (GLUCOPHAGE) 1000 MG tablet Take 1,000 mg by mouth 2 (two) times daily with a meal.      metoprolol tartrate (LOPRESSOR) 25 MG tablet Take 12.5 mg by mouth 2 (two) times daily.     nortriptyline (PAMELOR) 10 MG capsule Take 30 mg by mouth at bedtime.      PARoxetine (PAXIL) 40 MG tablet Take 60 mg by mouth daily.      rOPINIRole (REQUIP) 0.5 MG tablet Take 0.5 mg by mouth at bedtime.     simvastatin (ZOCOR) 40 MG tablet Take 40 mg by mouth at bedtime.      tamsulosin (FLOMAX) 0.4 MG CAPS capsule Take 0.4 mg by mouth daily after lunch.      TRULICITY 4.5 XI/3.3AS SOPN Inject 4.5 mg into the skin once a week. Sunday     vitamin B-12 (CYANOCOBALAMIN) 1000 MCG tablet Take 1,000 mcg by mouth at bedtime.     No current facility-administered medications on file prior to visit.    There are no Patient Instructions on file for this visit. No follow-ups on file.   Kris Hartmann, NP

## 2022-10-21 ENCOUNTER — Encounter: Payer: Self-pay | Admitting: Podiatry

## 2022-11-23 ENCOUNTER — Emergency Department: Payer: Medicare Other

## 2022-11-23 ENCOUNTER — Other Ambulatory Visit: Payer: Self-pay

## 2022-11-23 ENCOUNTER — Inpatient Hospital Stay
Admission: EM | Admit: 2022-11-23 | Discharge: 2022-11-28 | DRG: 504 | Disposition: A | Discharge status: Home Health | Payer: Medicare Other | Attending: Internal Medicine | Admitting: Internal Medicine

## 2022-11-23 DIAGNOSIS — E1165 Type 2 diabetes mellitus with hyperglycemia: Secondary | ICD-10-CM | POA: Diagnosis present

## 2022-11-23 DIAGNOSIS — I482 Chronic atrial fibrillation, unspecified: Secondary | ICD-10-CM | POA: Diagnosis present

## 2022-11-23 DIAGNOSIS — E1122 Type 2 diabetes mellitus with diabetic chronic kidney disease: Secondary | ICD-10-CM | POA: Diagnosis present

## 2022-11-23 DIAGNOSIS — Y835 Amputation of limb(s) as the cause of abnormal reaction of the patient, or of later complication, without mention of misadventure at the time of the procedure: Secondary | ICD-10-CM | POA: Diagnosis present

## 2022-11-23 DIAGNOSIS — Z8673 Personal history of transient ischemic attack (TIA), and cerebral infarction without residual deficits: Secondary | ICD-10-CM

## 2022-11-23 DIAGNOSIS — N179 Acute kidney failure, unspecified: Secondary | ICD-10-CM | POA: Diagnosis present

## 2022-11-23 DIAGNOSIS — F4310 Post-traumatic stress disorder, unspecified: Secondary | ICD-10-CM | POA: Diagnosis present

## 2022-11-23 DIAGNOSIS — E669 Obesity, unspecified: Secondary | ICD-10-CM | POA: Diagnosis present

## 2022-11-23 DIAGNOSIS — M869 Osteomyelitis, unspecified: Secondary | ICD-10-CM | POA: Diagnosis present

## 2022-11-23 DIAGNOSIS — N401 Enlarged prostate with lower urinary tract symptoms: Secondary | ICD-10-CM | POA: Diagnosis present

## 2022-11-23 DIAGNOSIS — Z7902 Long term (current) use of antithrombotics/antiplatelets: Secondary | ICD-10-CM

## 2022-11-23 DIAGNOSIS — E785 Hyperlipidemia, unspecified: Secondary | ICD-10-CM | POA: Diagnosis present

## 2022-11-23 DIAGNOSIS — Z8249 Family history of ischemic heart disease and other diseases of the circulatory system: Secondary | ICD-10-CM

## 2022-11-23 DIAGNOSIS — E1129 Type 2 diabetes mellitus with other diabetic kidney complication: Secondary | ICD-10-CM | POA: Diagnosis present

## 2022-11-23 DIAGNOSIS — Z79899 Other long term (current) drug therapy: Secondary | ICD-10-CM

## 2022-11-23 DIAGNOSIS — Z89421 Acquired absence of other right toe(s): Secondary | ICD-10-CM

## 2022-11-23 DIAGNOSIS — E860 Dehydration: Secondary | ICD-10-CM | POA: Diagnosis present

## 2022-11-23 DIAGNOSIS — E875 Hyperkalemia: Secondary | ICD-10-CM | POA: Diagnosis present

## 2022-11-23 DIAGNOSIS — N1831 Chronic kidney disease, stage 3a: Secondary | ICD-10-CM | POA: Diagnosis present

## 2022-11-23 DIAGNOSIS — R351 Nocturia: Secondary | ICD-10-CM | POA: Diagnosis present

## 2022-11-23 DIAGNOSIS — Z86010 Personal history of colonic polyps: Secondary | ICD-10-CM

## 2022-11-23 DIAGNOSIS — I10 Essential (primary) hypertension: Secondary | ICD-10-CM | POA: Diagnosis present

## 2022-11-23 DIAGNOSIS — R739 Hyperglycemia, unspecified: Principal | ICD-10-CM

## 2022-11-23 DIAGNOSIS — E1140 Type 2 diabetes mellitus with diabetic neuropathy, unspecified: Secondary | ICD-10-CM | POA: Diagnosis present

## 2022-11-23 DIAGNOSIS — Z794 Long term (current) use of insulin: Secondary | ICD-10-CM

## 2022-11-23 DIAGNOSIS — Z6835 Body mass index (BMI) 35.0-35.9, adult: Secondary | ICD-10-CM

## 2022-11-23 DIAGNOSIS — Z951 Presence of aortocoronary bypass graft: Secondary | ICD-10-CM

## 2022-11-23 DIAGNOSIS — G2581 Restless legs syndrome: Secondary | ICD-10-CM | POA: Diagnosis present

## 2022-11-23 DIAGNOSIS — E11621 Type 2 diabetes mellitus with foot ulcer: Secondary | ICD-10-CM | POA: Diagnosis present

## 2022-11-23 DIAGNOSIS — E1169 Type 2 diabetes mellitus with other specified complication: Secondary | ICD-10-CM | POA: Diagnosis present

## 2022-11-23 DIAGNOSIS — Z87891 Personal history of nicotine dependence: Secondary | ICD-10-CM

## 2022-11-23 DIAGNOSIS — I4892 Unspecified atrial flutter: Secondary | ICD-10-CM | POA: Diagnosis present

## 2022-11-23 DIAGNOSIS — T8743 Infection of amputation stump, right lower extremity: Principal | ICD-10-CM | POA: Diagnosis present

## 2022-11-23 DIAGNOSIS — M86171 Other acute osteomyelitis, right ankle and foot: Secondary | ICD-10-CM | POA: Diagnosis present

## 2022-11-23 DIAGNOSIS — E11649 Type 2 diabetes mellitus with hypoglycemia without coma: Secondary | ICD-10-CM | POA: Diagnosis present

## 2022-11-23 DIAGNOSIS — E1142 Type 2 diabetes mellitus with diabetic polyneuropathy: Secondary | ICD-10-CM | POA: Diagnosis present

## 2022-11-23 DIAGNOSIS — M86271 Subacute osteomyelitis, right ankle and foot: Secondary | ICD-10-CM

## 2022-11-23 DIAGNOSIS — F32A Depression, unspecified: Secondary | ICD-10-CM | POA: Diagnosis present

## 2022-11-23 DIAGNOSIS — Z7985 Long-term (current) use of injectable non-insulin antidiabetic drugs: Secondary | ICD-10-CM

## 2022-11-23 DIAGNOSIS — I2510 Atherosclerotic heart disease of native coronary artery without angina pectoris: Secondary | ICD-10-CM | POA: Diagnosis present

## 2022-11-23 DIAGNOSIS — L97516 Non-pressure chronic ulcer of other part of right foot with bone involvement without evidence of necrosis: Secondary | ICD-10-CM | POA: Diagnosis present

## 2022-11-23 DIAGNOSIS — Z833 Family history of diabetes mellitus: Secondary | ICD-10-CM

## 2022-11-23 DIAGNOSIS — N400 Enlarged prostate without lower urinary tract symptoms: Secondary | ICD-10-CM | POA: Diagnosis present

## 2022-11-23 DIAGNOSIS — G4730 Sleep apnea, unspecified: Secondary | ICD-10-CM | POA: Diagnosis present

## 2022-11-23 DIAGNOSIS — G4710 Hypersomnia, unspecified: Secondary | ICD-10-CM | POA: Diagnosis present

## 2022-11-23 DIAGNOSIS — I129 Hypertensive chronic kidney disease with stage 1 through stage 4 chronic kidney disease, or unspecified chronic kidney disease: Secondary | ICD-10-CM | POA: Diagnosis present

## 2022-11-23 DIAGNOSIS — L089 Local infection of the skin and subcutaneous tissue, unspecified: Secondary | ICD-10-CM

## 2022-11-23 DIAGNOSIS — Z7901 Long term (current) use of anticoagulants: Secondary | ICD-10-CM

## 2022-11-23 LAB — CBC WITH DIFFERENTIAL/PLATELET
Abs Immature Granulocytes: 0.09 10*3/uL — ABNORMAL HIGH (ref 0.00–0.07)
Basophils Absolute: 0 10*3/uL (ref 0.0–0.1)
Basophils Relative: 0 %
Eosinophils Absolute: 0.1 10*3/uL (ref 0.0–0.5)
Eosinophils Relative: 1 %
HCT: 31.7 % — ABNORMAL LOW (ref 39.0–52.0)
Hemoglobin: 10.1 g/dL — ABNORMAL LOW (ref 13.0–17.0)
Immature Granulocytes: 1 %
Lymphocytes Relative: 13 %
Lymphs Abs: 1.2 10*3/uL (ref 0.7–4.0)
MCH: 27.7 pg (ref 26.0–34.0)
MCHC: 31.9 g/dL (ref 30.0–36.0)
MCV: 86.8 fL (ref 80.0–100.0)
Monocytes Absolute: 0.8 10*3/uL (ref 0.1–1.0)
Monocytes Relative: 8 %
Neutro Abs: 6.9 10*3/uL (ref 1.7–7.7)
Neutrophils Relative %: 77 %
Platelets: 354 10*3/uL (ref 150–400)
RBC: 3.65 MIL/uL — ABNORMAL LOW (ref 4.22–5.81)
RDW: 14.5 % (ref 11.5–15.5)
WBC: 9 10*3/uL (ref 4.0–10.5)
nRBC: 0 % (ref 0.0–0.2)

## 2022-11-23 LAB — CBG MONITORING, ED
Glucose-Capillary: 370 mg/dL — ABNORMAL HIGH (ref 70–99)
Glucose-Capillary: 254 mg/dL — ABNORMAL HIGH (ref 70–99)

## 2022-11-23 LAB — LIPASE, BLOOD: Lipase: 42 U/L (ref 11–51)

## 2022-11-23 LAB — COMPREHENSIVE METABOLIC PANEL
ALT: 17 U/L (ref 0–44)
AST: 20 U/L (ref 15–41)
Albumin: 3.9 g/dL (ref 3.5–5.0)
Alkaline Phosphatase: 90 U/L (ref 38–126)
Anion gap: 11 (ref 5–15)
BUN: 36 mg/dL — ABNORMAL HIGH (ref 8–23)
CO2: 21 mmol/L — ABNORMAL LOW (ref 22–32)
Calcium: 9.4 mg/dL (ref 8.9–10.3)
Chloride: 103 mmol/L (ref 98–111)
Creatinine, Ser: 1.76 mg/dL — ABNORMAL HIGH (ref 0.61–1.24)
GFR, Estimated: 39 mL/min — ABNORMAL LOW (ref >60)
Glucose, Bld: 339 mg/dL — ABNORMAL HIGH (ref 70–99)
Potassium: 5.3 mmol/L — ABNORMAL HIGH (ref 3.5–5.1)
Sodium: 135 mmol/L (ref 135–145)
Total Bilirubin: 0.7 mg/dL (ref 0.3–1.2)
Total Protein: 7.6 g/dL (ref 6.5–8.1)

## 2022-11-23 LAB — PHOSPHORUS: Phosphorus: 4.2 mg/dL (ref 2.5–4.6)

## 2022-11-23 LAB — LACTIC ACID, PLASMA
Lactic Acid, Venous: 2.8 mmol/L (ref 0.5–1.9)
Lactic Acid, Venous: 1.8 mmol/L (ref 0.5–1.9)

## 2022-11-23 LAB — MAGNESIUM: Magnesium: 2.7 mg/dL — ABNORMAL HIGH (ref 1.7–2.4)

## 2022-11-23 MED ORDER — CALCIUM CARBONATE ANTACID 500 MG PO CHEW: 1.0000 | CHEWABLE_TABLET | Freq: Every day | ORAL | Status: DC | PRN

## 2022-11-23 MED ORDER — ROPINIROLE HCL 1 MG PO TABS
0.5000 mg | ORAL_TABLET | Freq: Every day | ORAL | Status: DC
  Administered 2022-11-23 – 2022-11-27 (×5): 0.5 mg via ORAL
  Filled 2022-11-23: qty 2
  Filled 2022-11-23 (×4): qty 1

## 2022-11-23 MED ORDER — SODIUM CHLORIDE 0.9 % IV SOLN
2.0000 g | Freq: Two times a day (BID) | INTRAVENOUS | Status: DC
  Administered 2022-11-24 – 2022-11-26 (×5): 2 g via INTRAVENOUS
  Filled 2022-11-23: qty 12.5
  Filled 2022-11-23: qty 2
  Filled 2022-11-23: qty 12.5
  Filled 2022-11-23 (×2): qty 2

## 2022-11-23 MED ORDER — NORTRIPTYLINE HCL 10 MG PO CAPS
30.0000 mg | ORAL_CAPSULE | Freq: Every day | ORAL | Status: DC
  Administered 2022-11-23 – 2022-11-27 (×5): 30 mg via ORAL
  Filled 2022-11-23 (×5): qty 3

## 2022-11-23 MED ORDER — VITAMIN B-12 1000 MCG PO TABS
1000.0000 ug | ORAL_TABLET | Freq: Every day | ORAL | Status: DC
  Administered 2022-11-24 – 2022-11-27 (×4): 1000 ug via ORAL
  Filled 2022-11-23 (×4): qty 1

## 2022-11-23 MED ORDER — ONDANSETRON HCL 4 MG/2ML IJ SOLN: 4.0000 mg | Freq: Four times a day (QID) | INTRAMUSCULAR | Status: DC | PRN

## 2022-11-23 MED ORDER — ACETAMINOPHEN 325 MG PO TABS: 650.0000 mg | ORAL_TABLET | Freq: Four times a day (QID) | ORAL | Status: DC | PRN

## 2022-11-23 MED ORDER — INSULIN GLARGINE-YFGN 100 UNIT/ML ~~LOC~~ SOLN: 90.0000 [IU] | Freq: Every day | SUBCUTANEOUS | Status: DC

## 2022-11-23 MED ORDER — SIMVASTATIN 20 MG PO TABS
40.0000 mg | ORAL_TABLET | Freq: Every day | ORAL | Status: DC
  Administered 2022-11-23 – 2022-11-27 (×5): 40 mg via ORAL
  Filled 2022-11-23 (×4): qty 2
  Filled 2022-11-23: qty 4

## 2022-11-23 MED ORDER — INSULIN ASPART 100 UNIT/ML IJ SOLN
0.0000 [IU] | Freq: Three times a day (TID) | INTRAMUSCULAR | Status: DC
  Administered 2022-11-24: 5 [IU] via SUBCUTANEOUS
  Administered 2022-11-25: 3 [IU] via SUBCUTANEOUS
  Administered 2022-11-25: 5 [IU] via SUBCUTANEOUS
  Administered 2022-11-25: 2 [IU] via SUBCUTANEOUS
  Administered 2022-11-26: 5 [IU] via SUBCUTANEOUS
  Administered 2022-11-26: 2 [IU] via SUBCUTANEOUS
  Administered 2022-11-26: 9 [IU] via SUBCUTANEOUS
  Administered 2022-11-27: 1 [IU] via SUBCUTANEOUS
  Administered 2022-11-27 (×2): 3 [IU] via SUBCUTANEOUS
  Administered 2022-11-28 (×2): 2 [IU] via SUBCUTANEOUS
  Filled 2022-11-23 (×12): qty 1

## 2022-11-23 MED ORDER — HYDRALAZINE HCL 10 MG PO TABS
10.0000 mg | ORAL_TABLET | Freq: Four times a day (QID) | ORAL | Status: DC | PRN
  Filled 2022-11-23: qty 1

## 2022-11-23 MED ORDER — ICOSAPENT ETHYL 1 G PO CAPS
2.0000 g | ORAL_CAPSULE | Freq: Two times a day (BID) | ORAL | Status: DC
  Administered 2022-11-24 – 2022-11-27 (×8): 2 g via ORAL
  Filled 2022-11-23 (×12): qty 2

## 2022-11-23 MED ORDER — HEPARIN SODIUM (PORCINE) 5000 UNIT/ML IJ SOLN
5000.0000 [IU] | Freq: Three times a day (TID) | INTRAMUSCULAR | Status: AC
  Administered 2022-11-23: 5000 [IU] via SUBCUTANEOUS
  Filled 2022-11-23: qty 1

## 2022-11-23 MED ORDER — ONDANSETRON HCL 4 MG PO TABS: 4.0000 mg | ORAL_TABLET | Freq: Four times a day (QID) | ORAL | Status: DC | PRN

## 2022-11-23 MED ORDER — VANCOMYCIN VARIABLE DOSE PER UNSTABLE RENAL FUNCTION (PHARMACIST DOSING): Status: DC

## 2022-11-23 MED ORDER — VANCOMYCIN HCL 2000 MG/400ML IV SOLN
2000.0000 mg | Freq: Once | INTRAVENOUS | Status: AC
  Administered 2022-11-23: 2000 mg via INTRAVENOUS
  Filled 2022-11-23: qty 400

## 2022-11-23 MED ORDER — SODIUM CHLORIDE 0.9 % IV BOLUS
1000.0000 mL | Freq: Once | INTRAVENOUS | Status: AC
  Administered 2022-11-23: 1000 mL via INTRAVENOUS

## 2022-11-23 MED ORDER — INSULIN GLARGINE-YFGN 100 UNIT/ML ~~LOC~~ SOLN
40.0000 [IU] | Freq: Once | SUBCUTANEOUS | Status: AC
  Administered 2022-11-23: 40 [IU] via SUBCUTANEOUS
  Filled 2022-11-23: qty 0.4

## 2022-11-23 MED ORDER — SODIUM CHLORIDE 0.9 % IV SOLN: INTRAVENOUS | Status: AC

## 2022-11-23 MED ORDER — ACETAMINOPHEN 650 MG RE SUPP: 650.0000 mg | Freq: Four times a day (QID) | RECTAL | Status: DC | PRN

## 2022-11-23 MED ORDER — PAROXETINE HCL 30 MG PO TABS
60.0000 mg | ORAL_TABLET | Freq: Every day | ORAL | Status: DC
  Administered 2022-11-24 – 2022-11-28 (×5): 60 mg via ORAL
  Filled 2022-11-23 (×5): qty 2

## 2022-11-23 MED ORDER — INSULIN GLARGINE-YFGN 100 UNIT/ML ~~LOC~~ SOLN
90.0000 [IU] | Freq: Every day | SUBCUTANEOUS | Status: DC
  Filled 2022-11-23: qty 0.9

## 2022-11-23 MED ORDER — SENNOSIDES-DOCUSATE SODIUM 8.6-50 MG PO TABS: 1.0000 | ORAL_TABLET | Freq: Every evening | ORAL | Status: DC | PRN

## 2022-11-23 MED ORDER — METOPROLOL TARTRATE 25 MG PO TABS: 12.5000 mg | ORAL_TABLET | Freq: Two times a day (BID) | ORAL | Status: DC

## 2022-11-23 MED ORDER — INSULIN ASPART 100 UNIT/ML IJ SOLN
0.0000 [IU] | Freq: Every day | INTRAMUSCULAR | Status: DC
  Administered 2022-11-23 – 2022-11-25 (×3): 3 [IU] via SUBCUTANEOUS
  Filled 2022-11-23 (×3): qty 1

## 2022-11-23 MED ORDER — GABAPENTIN 100 MG PO CAPS
100.0000 mg | ORAL_CAPSULE | Freq: Three times a day (TID) | ORAL | Status: DC
  Administered 2022-11-23 – 2022-11-28 (×13): 100 mg via ORAL
  Filled 2022-11-23 (×13): qty 1

## 2022-11-23 MED ORDER — SODIUM CHLORIDE 0.9 % IV SOLN
2.0000 g | Freq: Once | INTRAVENOUS | Status: AC
  Administered 2022-11-23: 2 g via INTRAVENOUS
  Filled 2022-11-23: qty 12.5

## 2022-11-23 NOTE — Progress Notes (Signed)
PHARMACY -  BRIEF ANTIBIOTIC NOTE   Pharmacy has received consult(s) for vancomycin from an ED provider.  The patient's profile has been reviewed for ht/wt/allergies/indication/available labs.    One time order(s) placed for vancomycin 2000 mg IV x 1  Further antibiotics/pharmacy consults should be ordered by admitting physician if indicated.                       Thank you, Dallie Piles 11/23/2022  9:34 PM

## 2022-11-23 NOTE — Assessment & Plan Note (Addendum)
-   I have resumed home equivalent long-acting insulin/glargine Semglee 90 units nightly 11/24/22 - On night of admission, I ordered insulin glargine Semglee 40 units one-time dose due to patient being n.p.o. after midnight - I have not resumed home metformin due to acute kidney injury - Insulin SSI with at bedtime coverage ordered - Goal inpatient blood glucose levels 140-180

## 2022-11-23 NOTE — ED Notes (Signed)
Critical Result: Lactic Acid 2.8  Jacelyn Grip MD aware.

## 2022-11-23 NOTE — Assessment & Plan Note (Signed)
-   Resumed home paroxetine 60 mg daily, nortriptyline 30 mg nightly

## 2022-11-23 NOTE — Assessment & Plan Note (Addendum)
Right third toe With elevated lactic acid - Status post amputation via Dr. Vickki Muff about 2 months ago and is positive for purulent discharge - Continue with cefepime and vancomycin - Podiatry is on board and he will be going to the OR for the third and fourth toe amputation tomorrow.

## 2022-11-23 NOTE — ED Provider Notes (Signed)
[facility] Provider Note    Event Date/Time   First MD Initiated Contact with Patient 11/23/22 2110     (approximate)   History   Hyperglycemia   HPI  [name] is a 76 y.o. male   Past medical history of CAD, cerebellar stroke, atrial fibrillation on Eliquis, CHF, depression, hyperlipidemia, hypertension, PTSD, TIA, right third toe amputation in late September by Dr. Vickki Muff of podiatry who presents to the emergency department with abnormal labs obtained in endocrinology clinic earlier today.  He had some medication adjustments including his insulin by his endocrinologist today in clinic, he states he did not take any of his medications in anticipation for this clinic visit and had labs drawn there which showed hyperglycemia to the 600s and hyperkalemia to the sixes.  He has otherwise been well and states that he has been in his normal state of health and would not have come to the emergency department otherwise.  On review of systems he does state that his amputated toe on the right side has been giving him worsening pain and he has seen some pus coming from it but denies systemic symptoms like fevers or chills.    History was obtained via the patient.  I reviewed external medical note including 09/25/2022 surgical note for amputation of the right toe.      Physical Exam   Triage Vital Signs: ED Triage Vitals  Enc Vitals Group     BP 11/23/22 1809 (!) 119/59     Pulse Rate 11/23/22 1809 75     Resp 11/23/22 1809 20     Temp 11/23/22 1809 98.2 F (36.8 C)     Temp Source 11/23/22 1809 Oral     SpO2 11/23/22 1809 99 %     Weight 11/23/22 1810 226 lb (102.5 kg)     Height 11/23/22 1810 6' (1.829 m)     Head Circumference --      Peak Flow --      Pain Score 11/23/22 1810 0     Pain Loc --      Pain Edu? --      Excl. in [place]? --     Most recent vital signs: Vitals:   11/23/22 1809  BP: (!) 119/59  Pulse: 75  Resp: 20  Temp: 98.2 F  (36.8 C)  SpO2: 99%    General: Awake, no distress.  CV:  Good peripheral perfusion.  Resp:  Normal effort.  Abd:  No distention.  Other:  His third toe on the right side has some purulent drainage with surrounding erythema concerning for infection.   ED Results / Procedures / Treatments   Labs (all labs ordered are listed, but only abnormal results are displayed) Labs Reviewed  CBC WITH DIFFERENTIAL/PLATELET - Abnormal; Notable for the following components:      Result Value   RBC 3.65 (*)    Hemoglobin 10.1 (*)    HCT 31.7 (*)    Abs Immature Granulocytes 0.09 (*)    All other components within normal limits  COMPREHENSIVE METABOLIC PANEL - Abnormal; Notable for the following components:   Potassium 5.3 (*)    CO2 21 (*)    Glucose, Bld 339 (*)    BUN 36 (*)    Creatinine, Ser 1.76 (*)    GFR, Estimated 39 (*)    All other components within normal limits  MAGNESIUM - Abnormal; Notable for the following components:   Magnesium 2.7 (*)  All other components within normal limits  LACTIC ACID, PLASMA - Abnormal; Notable for the following components:   Lactic Acid, Venous 2.8 (*)    All other components within normal limits  CBG MONITORING, ED - Abnormal; Notable for the following components:   Glucose-Capillary 370 (*)    All other components within normal limits  CBG MONITORING, ED - Abnormal; Notable for the following components:   Glucose-Capillary 254 (*)    All other components within normal limits  CULTURE, BLOOD (ROUTINE X 2)  CULTURE, BLOOD (ROUTINE X 2)  LIPASE, BLOOD  LACTIC ACID, PLASMA  PHOSPHORUS  BASIC METABOLIC PANEL  CBC  HEMOGLOBIN A1C     I reviewed labs and they are notable for he has a blood glucose of 339 and a potassium of 5.3 which is markedly lower than outpatient labs noted.  His anion gap is normal.  ED ECG REPORT I, Lucillie Garfinkel, the attending physician, personally viewed and interpreted this ECG.   Date: 11/23/2022  EKG Time:  2245  Rate: 67  Rhythm: normal sinus rhythm  Axis: nl  Intervals:long pr 1st degree av block, rbbb  ST&T Change: no acute ischemia no peaked t or pr flattening     RADIOLOGY I independently reviewed and interpreted x-ray of the right foot and see no obvious bony erosive changes to the affected third digit.   PROCEDURES:  Critical Care performed: No  Procedures   MEDICATIONS ORDERED IN ED: Medications  ceFEPIme (MAXIPIME) 2 g in sodium chloride 0.9 % 100 mL IVPB (2 g Intravenous New Bag/Given 11/23/22 2231)  vancomycin (VANCOREADY) IVPB 2000 mg/400 mL (has no administration in time range)  simvastatin (ZOCOR) tablet 40 mg (40 mg Oral Given 11/23/22 2232)  gabapentin (NEURONTIN) capsule 100 mg (100 mg Oral Given 11/23/22 2233)  acetaminophen (TYLENOL) tablet 650 mg (has no administration in time range)    Or  acetaminophen (TYLENOL) suppository 650 mg (has no administration in time range)  ondansetron (ZOFRAN) tablet 4 mg (has no administration in time range)    Or  ondansetron (ZOFRAN) injection 4 mg (has no administration in time range)  senna-docusate (Senokot-S) tablet 1 tablet (has no administration in time range)  insulin aspart (novoLOG) injection 0-9 Units (has no administration in time range)  insulin aspart (novoLOG) injection 0-5 Units (3 Units Subcutaneous Given 11/23/22 2241)  insulin glargine-yfgn (SEMGLEE) injection 90 Units (has no administration in time range)  ceFEPIme (MAXIPIME) 2 g in sodium chloride 0.9 % 100 mL IVPB (has no administration in time range)  vancomycin variable dose per unstable renal function (pharmacist dosing) (has no administration in time range)  metoprolol tartrate (LOPRESSOR) tablet 12.5 mg (has no administration in time range)  cyanocobalamin (VITAMIN B12) tablet 1,000 mcg (has no administration in time range)  0.9 %  sodium chloride infusion (has no administration in time range)  rOPINIRole (REQUIP) tablet 0.5 mg (has no administration  in time range)  nortriptyline (PAMELOR) capsule 30 mg (has no administration in time range)  PARoxetine (PAXIL) tablet 60 mg (has no administration in time range)  calcium carbonate (TUMS - dosed in mg elemental calcium) chewable tablet 200 mg of elemental calcium (has no administration in time range)  icosapent Ethyl (VASCEPA) 1 g capsule 2 g (has no administration in time range)  sodium chloride 0.9 % bolus 1,000 mL (1,000 mLs Intravenous New Bag/Given 11/23/22 2230)  heparin injection 5,000 Units (5,000 Units Subcutaneous Given 11/23/22 2233)  insulin glargine-yfgn (SEMGLEE) injection 40 Units (40 Units Subcutaneous  Given 11/23/22 2241)    Consultants:  I spoke with Dr. Vickki Muff of podiatry regarding care plan for this patient.   IMPRESSION / MDM / ASSESSMENT AND PLAN / ED COURSE  I reviewed the triage vital signs and the nursing notes.                              Differential diagnosis includes, but is not limited to, toe infection, cellulitis, osteomyelitis, sepsis, hyperglycemia, DKA, hyperkalemia   The patient is on the cardiac monitor to evaluate for evidence of arrhythmia and/or significant heart rate changes.  MDM: Fortunately electrolyte derangements are a lot less than on outpatient testing, EKG with no hyperkalemic changes.  Patient appears well and nontoxic.  He does have evidence of infection, given antibiotics and consulted with Dr. Vickki Muff who will see the patient tomorrow.  Patient is stable and admitted to hospitalist service.  Patient's presentation is most consistent with acute presentation with potential threat to life or bodily function.       FINAL CLINICAL IMPRESSION(S) / ED DIAGNOSES   Final diagnoses:  Hyperglycemia  Hyperkalemia  Toe infection     Rx / DC Orders   ED Discharge Orders     None        Note:  This document was prepared using Dragon voice recognition software and may include unintentional dictation errors.    Lucillie Garfinkel,  MD 11/23/22 2250

## 2022-11-23 NOTE — Assessment & Plan Note (Signed)
-   Ropinirole 0.5 mg nightly resumed

## 2022-11-23 NOTE — Assessment & Plan Note (Signed)
History of CKD stage IIIa. Creatinine seems improving but still above the baseline of 1.1-1.2. -Monitor renal function -Avoid nephrotoxins

## 2022-11-23 NOTE — Assessment & Plan Note (Signed)
-   Simvastatin 40 mg nightly resumed

## 2022-11-23 NOTE — ED Provider Triage Note (Signed)
Emergency Medicine Provider Triage Evaluation Note  [name] , a 76 y.o. male  was evaluated in triage.  Pt complains of concern for hyperglycemia and hyperkalemia.  Patient states that he had recent lab work conducted today and was referred to the emergency department.  He denies chest pain, chest tightness or shortness of breath.  No vomiting or abdominal pain at home.  Has been afebrile.  Review of Systems  Positive: Patient has concern for hyperglycemia.  Negative: No chest pain or abdominal pain.   Physical Exam  There were no vitals taken for this visit. Gen:   Awake, no distress   Resp:  Normal effort  MSK:   Moves extremities without difficulty  Other:    Medical Decision Making  Medically screening exam initiated at 6:06 PM.  Appropriate orders placed.  [name] was informed that the remainder of the evaluation will be completed by another provider, this initial triage assessment does not replace that evaluation, and the importance of remaining in the ED until their evaluation is complete.     [name], [place] 11/23/22 1808

## 2022-11-23 NOTE — Progress Notes (Signed)
Pharmacy Antibiotic Note  [name] is a 76 y.o. male s/p amputation of third toe on the right in late September admitted on 11/23/2022 with cellulitis.  Pharmacy has been consulted for vancomycin and cefepime dosing.  Plan:  1) start vancomycin 2000 mg IV x 1  --variable dose placeholder due to impaired renal function  2) start cefepime 2 grams IV every 12 hours   Height: 6' (182.9 cm) Weight: 102.5 kg (226 lb) IBW/kg (Calculated) : 77.6  Temp (24hrs), Avg:98.2 F (36.8 C), Min:98.2 F (36.8 C), Max:98.2 F (36.8 C)  Recent Labs  Lab 11/23/22 1615 11/23/22 1815  WBC 9.0  --   CREATININE 1.76*  --   LATICACIDVEN  --  2.8*    Estimated Creatinine Clearance: 43.6 mL/min (A) (by C-G formula based on SCr of 1.76 mg/dL (H)).    No Known Allergies  Antimicrobials this admission: 11/27 vancomycin >>  11/27 cefepime >>   Microbiology results: 11/27 BCx: pending  Thank you for allowing pharmacy to be a part of this patient's care.  Dallie Piles 11/23/2022 10:05 PM

## 2022-11-23 NOTE — Assessment & Plan Note (Signed)
-   Gabapentin 100 mg p.o. 3 times daily resumed

## 2022-11-23 NOTE — H&P (Addendum)
History and Physical   [name] YFV:[id number] DOB: [birth year] DOA: 11/23/2022  PCP: [name], MD  Outpatient Specialists: Dr. Vickki Muff, podiatry Patient coming from: Home  I have personally briefly reviewed patient's old medical records in [place].  Chief Concern: Abnormal labs  HPI: [name] is a 76 year old male with history of insulin-dependent diabetes mellitus, depression, peripheral neuropathy, paroxysmal atrial flutter, CAD, hypertension, BPH, who presents emergency department for chief concerns of abnormal labs outpatient.  Initial vitals in the emergency department showed temperature of 98.2, respiration rate of 20, heart rate of 75, blood pressure 119/59, SpO2 of 99% on room air.  Serum sodium is 135, potassium 5.3, chloride of 103, bicarb 21, BUN of 36, serum creatinine of 1.76, EGFR 39, nonfasting blood glucose 339, anion gap was 11, WBC 9.0, chloride of 10.1, platelets of 354.  Lactic acid elevated at 2.8.  Lipase was negative.  Magnesium level was 2.7.  Right foot x-ray was read as: Peripheral gas collection at the third digit could be due to bandage material or potential soft tissue ulcer.  ED treatment: Cefepime, vancomycin, sodium chloride 1 L bolus. ----------------------- At bedside, [name] is able to tell me his name, age, current location, current year.   [name] reports the 3rd right toe has been bothering him for about two weeks.  [name] denies known trauma.  [name] states [name] saw a blister on the 4th toe but did not see a blister for the 3rd toe. [name] used a cream for it but it did not help.   [name] denies fever, chills, chest pain, shortness of breath, dysuria, hematuria, diarrhea.  [name] endorses polyuria, especially nocturia of approximately 5-6 times per night.  Social history: [name] lives by himself.  His wife passed about 7 years ago.  [name] denies tobacco, EtOH, recreational drug use.  ROS: Constitutional: no weight change, no  fever ENT/Mouth: no sore throat, no rhinorrhea Eyes: no eye pain, no vision changes Cardiovascular: no chest pain, no dyspnea,  no edema, no palpitations Respiratory: no cough, no sputum, no wheezing Gastrointestinal: no nausea, no vomiting, no diarrhea, no constipation Genitourinary: no urinary incontinence, no dysuria, no hematuria Musculoskeletal: no arthralgias, no myalgias Skin: + skin lesions, no pruritus, increased purulence of the right third toe Neuro: no weakness, no loss of consciousness, no syncope Psych: no anxiety, no depression, no decrease appetite Heme/Lymph: no bruising, no bleeding  ED Course: Discussed with emergency medicine provider, patient requiring hospitalization for chief concerns of osteomyelitis.  Assessment/Plan  Principal Problem:   Osteomyelitis (HCC) Active Problems:   AKI (acute kidney injury) ([place])   Type II diabetes mellitus with renal manifestations (HCC)   HTN (hypertension)   HLD (hyperlipidemia)   BPH (benign prostatic hyperplasia)   Depression   New onset atrial flutter (HCC)   Hx of CABG   Diabetic neuropathy (HCC)   Hypersomnia with sleep apnea   Atrial fibrillation, chronic (HCC)   Hypertension, essential   Atrial flutter, paroxysmal (HCC)   Type 2 diabetes mellitus with stage 3 chronic kidney disease, with long-term current use of insulin (HCC)   Restless leg syndrome   Assessment and Plan:  * Osteomyelitis (HCC) Right third toe With elevated lactic acid - Status post amputation via Dr. Vickki Muff about 2 months ago and is positive for purulent discharge - Continue with cefepime and vancomycin - Per EDP, podiatry, Dr. Vickki Muff is aware that patient is being admitted for osteomyelitis of the toe      AKI (  acute kidney injury) ([place]) - Presumed secondary to dehydration in setting of hyperglycemia and patient not taking his home insulin prior to ED presentation - Status post sodium chloride 1 L bolus - Continue with sodium  chloride 125 mL/h, 1 day ordered  HTN (hypertension) - Resumed home metoprolol tartrate 12.5 mg p.o. twice daily for 11/24/2022 - Hydralazine 10 mg p.o. every 6 hours as needed for SBP greater than 175, 4 days ordered  HLD (hyperlipidemia) - Simvastatin 40 mg nightly resumed  Depression - Resumed home paroxetine 60 mg daily, nortriptyline 30 mg nightly  Restless leg syndrome - Ropinirole 0.5 mg nightly resumed  Type 2 diabetes mellitus with stage 3 chronic kidney disease, with long-term current use of insulin ([place]) - I have resumed home equivalent long-acting insulin/glargine Semglee 90 units nightly 11/24/22 - On night of admission, I ordered insulin glargine Semglee 40 units one-time dose due to patient being n.p.o. after midnight - I have not resumed home metformin due to acute kidney injury - Insulin SSI with at bedtime coverage ordered - Goal inpatient blood glucose levels 140-180  Diabetic neuropathy (HCC) - Gabapentin 100 mg p.o. 3 times daily resumed  New onset atrial flutter ([place]) - Holding home Eliquis on admission due to anticipation of amputation - AM team to resume when the benefits outweigh the risk  Pending med reconciliation Chart reviewed.   DVT prophylaxis: Heparin 5000 units one-time dose subcutaneous; AM team to reinitiate pharmacologic DVT prophylaxis when the benefits outweigh the risk Code Status: Full code Diet: Heart healthy/carb modified; n.p.o. after midnight Family Communication: No Disposition Plan: Pending podiatry evaluation Consults called: Podiatry via EDP Admission status: Telemetry medical, inpatient  Past Medical History:  Diagnosis Date   Atrial fibrillation ([place])    a.) CHA2DS2VASc = 6 (age x 2,  HTN, CVA/TIA x 2, T2DM);  b.) s/p DCCV 09/25/2020 (120 J x1); c.) rate/rhythm maintained on oral metoprolol tartrate; chronically anticoagulated with apixaban   BPH (benign prostatic hyperplasia)    CAD (coronary artery disease) 10/28/2016    a.) LHC 10/28/2016: 95% dLAD, 90% p-mLCx, 75% mLAD, 60% m-dLAD, 99% p-mRCA, 75% dRCA --> consult CVTS; b.) 3v [facility] 11/05/2016   Carotid artery disease ([place]) 06/29/2016   a.) carotid US 06/29/2016: <50% BICA   Cerebellar stroke ([place]) 11/13/2015   a.) CT imaging 11/13/2015 revealed old/remote LEFT cerebellar and LEFT carona radiata infarcts   CHF (congestive heart failure) (HCC)    Depression    Diabetic foot ulcers (HCC)    Diastolic dysfunction 19/50/9326   a.) TTE 06/28/2016: EF 55-60%, G1DD; b.) TTE 03/19/2020: EF 55-60%, mild BAE, mild-mod MR/TR.   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   a.) LIMA-LAD, SVG-OM1, SVG-PDA   TIA (transient ischemic attack)    a.) multiple since last CVA in 2017 per daughter   Tubular adenoma of colon    Type 2 diabetes mellitus treated with insulin [facility])    Past Surgical History:  Procedure Laterality Date   AMPUTATION TOE Right 06/14/2022   Procedure: RIGHT GREAT TOE AMPUTATION;  Surgeon: Vickki Muff,  Larkin Ina, DPM;  Location: [facility] ORS;  Service: Podiatry;  Laterality: Right;   AMPUTATION TOE Right 09/25/2022   Procedure: AMPUTATION TOE - SECOND;  Surgeon: Samara Deist, DPM;  Location: [facility] ORS;  Service: Podiatry;  Laterality: Right;   CARDIAC CATHETERIZATION Left 10/28/2016   Procedure: Left Heart Cath and Coronary Angiography;  Surgeon: Corey Skains, MD;  Location: [place] CV LAB;  Service: Cardiovascular;  Laterality: Left;   CARDIOVERSION N/A 09/25/2020   Procedure: CARDIOVERSION;  Surgeon: Corey Skains, MD;  Location: [facility] ORS;  Service: Cardiovascular;   Laterality: N/A;   COLONOSCOPY N/A 09/02/2022   Procedure: COLONOSCOPY;  Surgeon: Toledo, Benay Pike, MD;  Location: [facility] ENDOSCOPY;  Service: Gastroenterology;  Laterality: N/A;  IDDM   LOWER EXTREMITY ANGIOGRAPHY Right 06/16/2022   Procedure: Lower Extremity Angiography;  Surgeon: Katha Cabal, MD;  Location: [place] CV LAB;  Service: Cardiovascular;  Laterality: Right;   SHOULDER ARTHROSCOPY     TRIGGER FINGER RELEASE     UVULOPALATOPHARYNGOPLASTY, TONSILLECTOMY AND SEPTOPLASTY     Social History:  reports that [name] has quit smoking. His smoking use included cigars. [name] has never used smokeless tobacco. [name] reports that [name] does not drink alcohol and does not use drugs.  No Known Allergies Family History  Problem Relation Age of Onset   Hypertension Other    Diabetes Mellitus II Other    Diabetes Mother    Diabetes Sister    Atrial fibrillation Sister    Atrial fibrillation Father    Atrial fibrillation Daughter    Family history: Family history reviewed and not pertinent  Prior to Admission medications   Medication Sig Start Date End Date Taking? Authorizing Provider  apixaban (ELIQUIS) 5 MG TABS tablet Take 1 tablet (5 mg total) by mouth 2 (two) times daily. 03/20/20   Lavina Hamman, MD  calcium carbonate (TUMS - DOSED IN MG ELEMENTAL CALCIUM) 500 MG chewable tablet Chew 1 tablet by mouth daily as needed for indigestion or heartburn.    [provider]  cholecalciferol (VITAMIN D) 1000 UNITS tablet Take 1,000 Units by mouth daily.    [provider]  clopidogrel (PLAVIX) 75 MG tablet Take 75 mg by mouth daily.    [provider]  docusate sodium (COLACE) 100 MG capsule Take 100 mg by mouth 2 (two) times daily.    [provider]  finasteride (PROSCAR) 5 MG tablet Take 5 mg by mouth daily.    [provider]  furosemide (LASIX) 40 MG tablet Take 20 mg by mouth 2 (two) times daily.    [provider]  gabapentin  (NEURONTIN) 100 MG capsule Take 100 mg by mouth 3 (three) times daily.    [provider]  icosapent Ethyl (VASCEPA) 1 g capsule Take 2 g by mouth 2 (two) times daily.     [provider]  insulin aspart (NOVOLOG) 100 UNIT/ML injection Inject 20 Units into the skin 3 (three) times daily before meals. Patient taking differently: Inject 20-25 Units into the skin See admin instructions. Inject 30 units before breakfast, 30 units before lunch, and 35 units before supper 03/20/20   Lavina Hamman, MD  insulin glargine (LANTUS) 100 UNIT/ML injection Inject 0.9 mLs (90 Units total) into the skin at bedtime. Home med. Patient taking differently: Inject 100 Units into the skin at bedtime. Home med. 06/17/22   Enzo Bi, MD  metFORMIN (GLUCOPHAGE) 1000 MG tablet Take 1,000 mg by mouth 2 (two) times daily with a  meal.     [provider]  metoprolol tartrate (LOPRESSOR) 25 MG tablet Take 12.5 mg by mouth 2 (two) times daily.    [provider]  nortriptyline (PAMELOR) 10 MG capsule Take 30 mg by mouth at bedtime.     [provider]  PARoxetine (PAXIL) 40 MG tablet Take 60 mg by mouth daily.     [provider]  rOPINIRole (REQUIP) 0.5 MG tablet Take 0.5 mg by mouth at bedtime.    [provider]  simvastatin (ZOCOR) 40 MG tablet Take 40 mg by mouth at bedtime.     [provider]  tamsulosin (FLOMAX) 0.4 MG CAPS capsule Take 0.4 mg by mouth daily after lunch.     [provider]  TRULICITY 4.5 EY/8.1KG SOPN Inject 4.5 mg into the skin once a week. Sunday 05/09/22   [provider]  vitamin B-12 (CYANOCOBALAMIN) 1000 MCG tablet Take 1,000 mcg by mouth at bedtime.    [provider]   Physical Exam: Vitals:   11/23/22 1809 11/23/22 1810 11/23/22 2245 11/23/22 2300  BP: (!) 119/59  (!) 158/64 (!) 162/63  Pulse: 75  67 67  Resp: 20  16 (!) 22  Temp: 98.2 F (36.8 C)     TempSrc: Oral     SpO2: 99%     Weight:   102.5 kg    Height:  6' (1.829 m)     Constitutional: appears age-appropriate, NAD, calm, comfortable Eyes: PERRL, lids and conjunctivae normal ENMT: Mucous membranes are moist. Posterior pharynx clear of any exudate or lesions. Age-appropriate dentition. Hearing appropriate Neck: normal, supple, no masses, no thyromegaly Respiratory: clear to auscultation bilaterally, no wheezing, no crackles. Normal respiratory effort. No accessory muscle use.  Cardiovascular: Regular rate and rhythm, no murmurs / rubs / gallops. No extremity edema. 2+ pedal pulses. No carotid bruits.  Abdomen: obese, no tenderness, no masses palpated, no hepatosplenomegaly. Bowel sounds positive.  Musculoskeletal: no clubbing / cyanosis. No joint deformity upper and lower extremities. Good ROM, no contractures, no atrophy. Normal muscle tone.  Skin: no rashes, +lesions, ulcers. No induration      Neurologic: Sensation intact. Strength 5/5 in all 4.  Psychiatric: Normal judgment and insight. Alert and oriented x 3. Normal mood.   EKG: Ordered x-ray on Admission: I personally reviewed and I agree with radiologist reading as below.  DG Foot Complete Right  Result Date: 11/23/2022 CLINICAL DATA:  High glucose recent toe removal EXAM: RIGHT FOOT COMPLETE - 3+ VIEW COMPARISON:  06/13/2022 FINDINGS: Patient is status post partial amputation of the first and second digits at the level of the proximal phalanges. Cut margins appear smooth. Mild degenerative change at the first MTP joint. Peripheral gas collection at the third digit. Limited evaluation of third digit distal phalanx due to flexed positioning. IMPRESSION: 1. Status post partial amputation of the first and second digits at the level of the proximal phalanges. Cut margins appear smooth and no definite acute osseous abnormality at these locations 2. Peripheral gas collection at the third digit could be due to bandage material or potential soft tissue ulcer, correlate  with direct inspection. Electronically Signed   By: Donavan Foil M.D.   On: 11/23/2022 21:52    Labs on Admission: I have personally reviewed following labs  CBC: Recent Labs  Lab 11/23/22 1615  WBC 9.0  NEUTROABS 6.9  HGB 10.1*  HCT 31.7*  MCV 86.8  PLT 818   Basic Metabolic Panel: Recent Labs  Lab 11/23/22 1615 11/23/22 1815 11/23/22 2210  NA 135  --   --   K 5.3*  --   --   CL 103  --   --   CO2 21*  --   --   GLUCOSE 339*  --   --   BUN 36*  --   --   CREATININE 1.76*  --   --   CALCIUM 9.4  --   --   MG  --  2.7*  --   PHOS  --   --  4.2   GFR: Estimated Creatinine Clearance: 43.6 mL/min (A) (by C-G formula based on SCr of 1.76 mg/dL (H)).  Liver Function Tests: Recent Labs  Lab 11/23/22 1615  AST 20  ALT 17  ALKPHOS 90  BILITOT 0.7  PROT 7.6  ALBUMIN 3.9   Recent Labs  Lab 11/23/22 1615  LIPASE 42   CBG: Recent Labs  Lab 11/23/22 1806 11/23/22 2237  GLUCAP 370* 254*   Urine analysis:    Component Value Date/Time   COLORURINE YELLOW (A) 03/18/2020 1759   APPEARANCEUR CLEAR (A) 03/18/2020 1759   LABSPEC 1.010 03/18/2020 1759   PHURINE 6.0 03/18/2020 1759   GLUCOSEU NEGATIVE 03/18/2020 1759   HGBUR NEGATIVE 03/18/2020 1759   BILIRUBINUR NEGATIVE 03/18/2020 1759   KETONESUR NEGATIVE 03/18/2020 1759   PROTEINUR 100 (A) 03/18/2020 1759   NITRITE NEGATIVE 03/18/2020 1759   LEUKOCYTESUR NEGATIVE 03/18/2020 1759   Dr. Tobie Poet [facility]  If 7PM-7AM, please contact overnight-coverage provider If 7AM-7PM, please contact day coverage provider [email or web address]  11/23/2022, 11:35 PM

## 2022-11-23 NOTE — Assessment & Plan Note (Addendum)
Blood pressure elevated. - Resumed home metoprolol tartrate 12.5 mg p.o. twice daily for 11/24/2022 - Hydralazine 25 mg p.o. every 6 hours as needed for SBP greater than 160,

## 2022-11-23 NOTE — Assessment & Plan Note (Signed)
-   Holding home Eliquis on admission due to anticipation of amputation - AM team to resume when the benefits outweigh the risk

## 2022-11-23 NOTE — ED Triage Notes (Addendum)
Had blood drawn today for [facility] and glucose was 663 and K was 6.3 Patient CBG here was 370.  Patient reports for a week it has been reading high on his meter.  Patient mentioned recently had two toes removed on right foot but his 3rd toe now the skin has come off of it.

## 2022-11-23 NOTE — Hospital Course (Addendum)
Mr. Ryott Rafferty is a 76 year old male with history of insulin-dependent diabetes mellitus, depression, peripheral neuropathy, paroxysmal atrial flutter, CAD, hypertension, BPH, who presents emergency department for chief concerns of abnormal labs outpatient. He reports the 3rd right toe has been bothering him for about two weeks.  He denies known trauma.  No fever or chills.  Initial vitals in the emergency department showed temperature of 98.2, respiration rate of 20, heart rate of 75, blood pressure 119/59, SpO2 of 99% on room air.  Serum sodium is 135, potassium 5.3, chloride of 103, bicarb 21, BUN of 36, serum creatinine of 1.76, EGFR 39, nonfasting blood glucose 339, anion gap was 11, WBC 9.0, chloride of 10.1, platelets of 354.  Lactic acid elevated at 2.8.  Lipase was negative.  Magnesium level was 2.7.  Right foot x-ray was read as: Peripheral gas collection at the third digit could be due to bandage material or potential soft tissue ulcer.  ED treatment: Cefepime, vancomycin, sodium chloride 1 L bolus.  Podiatry was consulted.  11/28: Elevated blood pressure.  Pain can be contributory.  Pertinent labs for decrease in hemoglobin to 8.6 from 10.1 on admission, all cell lines decreased so some dilutional effect with IV fluid.  Renal functions seems improving, still little above baseline of 1.1-1.2.  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  Podiatry saw him and planning to take him to the OR tomorrow for third and fourth toe amputation.  We will continue with antibiotics per podiatry recommendations.  11/29: Blood pressure remained elevated.  Adding losartan 50 mg daily. Going to the OR later today for third and fourth toe amputation.  11/30: Patient underwent amputation of third and partial amputation of fourth toe with podiatry on 11/25/2022.  Preliminary cultures with gram-positive cocci in pairs. Labs seems stable.  CBG remained elevated-changing Semglee to 30 units twice daily and added  4 units with meal. Blood pressure remained elevated-adding amlodipine 10 mg. PT and OT are recommending home health services. Sent message to surgeon regarding restarting home Eliquis.  12/1: Patient remained hemodynamically stable.  Eliquis was restarted last night.  Postsurgical bone culture with gram-positive cocci in pairs and Enterococcus faecalis Antibiotics switched to Augmentin.  Patient would like to spend another night as he will get some help at home tomorrow.  Home health services ordered.  12/2: Patient remained stable.  He is being discharged on 5 more days of Augmentin. Need to have a close follow-up with his podiatrist for further recommendations.  He will continue on current medications and follow-up with his providers for further management.

## 2022-11-24 DIAGNOSIS — M86271 Subacute osteomyelitis, right ankle and foot: Secondary | ICD-10-CM | POA: Diagnosis not present

## 2022-11-24 LAB — CBC
HCT: 26.6 % — ABNORMAL LOW (ref 39.0–52.0)
Hemoglobin: 8.6 g/dL — ABNORMAL LOW (ref 13.0–17.0)
MCH: 28 pg (ref 26.0–34.0)
MCHC: 32.3 g/dL (ref 30.0–36.0)
MCV: 86.6 fL (ref 80.0–100.0)
Platelets: 255 10*3/uL (ref 150–400)
RBC: 3.07 MIL/uL — ABNORMAL LOW (ref 4.22–5.81)
RDW: 14.5 % (ref 11.5–15.5)
WBC: 7.5 10*3/uL (ref 4.0–10.5)
nRBC: 0 % (ref 0.0–0.2)

## 2022-11-24 LAB — BASIC METABOLIC PANEL
Anion gap: 4 — ABNORMAL LOW (ref 5–15)
BUN: 28 mg/dL — ABNORMAL HIGH (ref 8–23)
CO2: 23 mmol/L (ref 22–32)
Calcium: 8.5 mg/dL — ABNORMAL LOW (ref 8.9–10.3)
Chloride: 113 mmol/L — ABNORMAL HIGH (ref 98–111)
Creatinine, Ser: 1.34 mg/dL — ABNORMAL HIGH (ref 0.61–1.24)
GFR, Estimated: 55 mL/min — ABNORMAL LOW (ref >60)
Glucose, Bld: 79 mg/dL (ref 70–99)
Potassium: 4 mmol/L (ref 3.5–5.1)
Sodium: 140 mmol/L (ref 135–145)

## 2022-11-24 LAB — SURGICAL PCR SCREEN
MRSA, PCR: NEGATIVE
Staphylococcus aureus: NEGATIVE

## 2022-11-24 LAB — CBG MONITORING, ED
Glucose-Capillary: 81 mg/dL (ref 70–99)
Glucose-Capillary: 102 mg/dL — ABNORMAL HIGH (ref 70–99)

## 2022-11-24 LAB — GLUCOSE, CAPILLARY
Glucose-Capillary: 299 mg/dL — ABNORMAL HIGH (ref 70–99)
Glucose-Capillary: 254 mg/dL — ABNORMAL HIGH (ref 70–99)

## 2022-11-24 MED ORDER — INSULIN GLARGINE-YFGN 100 UNIT/ML ~~LOC~~ SOLN
50.0000 [IU] | Freq: Every day | SUBCUTANEOUS | Status: DC
  Administered 2022-11-24 – 2022-11-25 (×2): 50 [IU] via SUBCUTANEOUS
  Filled 2022-11-24 (×3): qty 0.5

## 2022-11-24 MED ORDER — HYDRALAZINE HCL 25 MG PO TABS
25.0000 mg | ORAL_TABLET | Freq: Four times a day (QID) | ORAL | Status: AC | PRN
  Administered 2022-11-24: 25 mg via ORAL
  Filled 2022-11-24: qty 1

## 2022-11-24 MED ORDER — FINASTERIDE 5 MG PO TABS
5.0000 mg | ORAL_TABLET | Freq: Every day | ORAL | Status: DC
  Administered 2022-11-24 – 2022-11-28 (×5): 5 mg via ORAL
  Filled 2022-11-24 (×5): qty 1

## 2022-11-24 MED ORDER — VANCOMYCIN HCL 2000 MG/400ML IV SOLN
2000.0000 mg | Freq: Once | INTRAVENOUS | Status: AC
  Administered 2022-11-24: 2000 mg via INTRAVENOUS
  Filled 2022-11-24: qty 400

## 2022-11-24 NOTE — ED Notes (Signed)
Pt given snack and diet soda per his request. Informed of new dietary orders for procedure tomorrow.

## 2022-11-24 NOTE — Assessment & Plan Note (Signed)
Estimated body mass index is 30.65 kg/m as calculated from the following:   Height as of this encounter: 6' (1.829 m).   Weight as of this encounter: 102.5 kg.

## 2022-11-24 NOTE — Progress Notes (Signed)
Pharmacy Antibiotic Note  [name] is a 76 y.o. male s/p amputation of third toe on the right in late September admitted on 11/23/2022 with cellulitis.  Pharmacy has been consulted for vancomycin and cefepime dosing.  Plan:  1) continue with vancomycin 2000 mg IV x 1 @ 11/28 2200, anticipate next dose needed 24 hours after  --variable dose placeholder due to impaired renal function  2) start cefepime 2 grams IV every 12 hours   Height: 6' (182.9 cm) Weight: 102.5 kg (226 lb) IBW/kg (Calculated) : 77.6  Temp (24hrs), Avg:98 F (36.7 C), Min:97.6 F (36.4 C), Max:98.4 F (36.9 C)  Recent Labs  Lab 11/23/22 1615 11/23/22 1815 11/23/22 2210 11/24/22 0812  WBC 9.0  --   --  7.5  CREATININE 1.76*  --   --  1.34*  LATICACIDVEN  --  2.8* 1.8  --      Estimated Creatinine Clearance: 57.2 mL/min (A) (by C-G formula based on SCr of 1.34 mg/dL (H)).    No Known Allergies  Antimicrobials this admission: 11/27 vancomycin >>  11/27 cefepime >>   Microbiology results: 11/27 BCx: pending  Thank you for allowing pharmacy to be a part of this patient's care.  Alison Murray 11/24/2022 1:07 PM

## 2022-11-24 NOTE — Consult Note (Signed)
ORTHOPAEDIC CONSULTATION  REQUESTING PHYSICIAN: Lorella Nimrod, MD  Chief Complaint: Right third toe infection  HPI: [name] is a 76 y.o. male who complains of right third toe infection.  He noticed this a couple weeks ago.  He is a patient of mine who has undergone previous amputation of his great toe and second toe.  Admitted with new onset A-fib but during evaluation had a noted infection in his right third toe.  X-rays have been performed.  Has longstanding history of peripheral vascular disease with neuropathy with diabetic ulcerations and infection in his right great toe and second toe in the past.  Past Medical History:  Diagnosis Date   Atrial fibrillation ([place])    a.) CHA2DS2VASc = 6 (age x 2,  HTN, CVA/TIA x 2, T2DM);  b.) s/p DCCV 09/25/2020 (120 J x1); c.) rate/rhythm maintained on oral metoprolol tartrate; chronically anticoagulated with apixaban   BPH (benign prostatic hyperplasia)    CAD (coronary artery disease) 10/28/2016   a.) LHC 10/28/2016: 95% dLAD, 90% p-mLCx, 75% mLAD, 60% m-dLAD, 99% p-mRCA, 75% dRCA --> consult CVTS; b.) 3v CABG at [facility] 11/05/2016   Carotid artery disease ([place]) 06/29/2016   a.) carotid US 06/29/2016: <50% BICA   Cerebellar stroke ([place]) 11/13/2015   a.) CT imaging 11/13/2015 revealed old/remote LEFT cerebellar and LEFT carona radiata infarcts   CHF (congestive heart failure) (HCC)    Depression    Diabetic foot ulcers (HCC)    Diastolic dysfunction 40/81/4481   a.) TTE 06/28/2016: EF 55-60%, G1DD; b.) TTE 03/19/2020: EF 55-60%, mild BAE, mild-mod MR/TR.   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   a.) LIMA-LAD, SVG-OM1, SVG-PDA   TIA (transient ischemic attack)    a.) multiple since last CVA in 2017 per daughter   Tubular adenoma of colon    Type 2 diabetes mellitus treated with insulin [facility])    Past Surgical History:  Procedure Laterality Date   AMPUTATION TOE Right 06/14/2022   Procedure: RIGHT GREAT TOE AMPUTATION;  Surgeon: Samara Deist, DPM;  Location: [facility] ORS;  Service: Podiatry;  Laterality: Right;   AMPUTATION TOE Right 09/25/2022   Procedure: AMPUTATION TOE - SECOND;  Surgeon: Samara Deist, DPM;  Location: [facility] ORS;  Service: Podiatry;  Laterality: Right;   CARDIAC CATHETERIZATION Left 10/28/2016   Procedure: Left Heart Cath and Coronary Angiography;  Surgeon: Corey Skains, MD;  Location: [facility];  Service: Cardiovascular;  Laterality: Left;   CARDIOVERSION N/A 09/25/2020   Procedure: CARDIOVERSION;  Surgeon: Corey Skains, MD;  Location: [facility] ORS;  Service: Cardiovascular;  Laterality: N/A;   COLONOSCOPY N/A 09/02/2022   Procedure: COLONOSCOPY;  Surgeon: Toledo, Benay Pike, MD;  Location: [facility] ENDOSCOPY;  Service: Gastroenterology;  Laterality: N/A;  IDDM   LOWER EXTREMITY ANGIOGRAPHY Right 06/16/2022   Procedure: Lower Extremity Angiography;  Surgeon: Katha Cabal, MD;  Location: [facility];  Service: Cardiovascular;  Laterality: Right;   SHOULDER ARTHROSCOPY     TRIGGER FINGER RELEASE     UVULOPALATOPHARYNGOPLASTY, TONSILLECTOMY AND SEPTOPLASTY  Social History   Socioeconomic History   Marital status: Widowed    Spouse name: Not on file   Number of children: 2   Years of education: Not on file   Highest education level: Some college, no degree  Occupational History    Comment: retired  Tobacco Use   Smoking status: Former    Types: Cigars   Smokeless tobacco: Never   Tobacco comments:    Very Hotel manager Use   Vaping Use: Never used  Substance and Sexual  Activity   Alcohol use: No    Alcohol/week: 0.0 standard drinks of alcohol   Drug use: No   Sexual activity: Not Currently  Other Topics Concern   Not on file  Social History Narrative   Not on file   Social Determinants of Health   Financial Resource Strain: Low Risk  (03/01/2018)   Overall Financial Resource Strain (CARDIA)    Difficulty of Paying Living Expenses: Not hard at all  Food Insecurity: No Food Insecurity (03/01/2018)   Hunger Vital Sign    Worried About Running Out of Food in the Last Year: Never true    [place] in the Last Year: Never true  Transportation Needs: No Transportation Needs (03/01/2018)   PRAPARE - Hydrologist (Medical): No    Lack of Transportation (Non-Medical): No  Physical Activity: Inactive (03/01/2018)   Exercise Vital Sign    Days of Exercise per Week: 0 days    Minutes of Exercise per Session: 0 min  Stress: No Stress Concern Present (03/01/2018)   [place]    Feeling of Stress : Not at all  Social Connections: Unknown (03/01/2018)   Social Connection and Isolation Panel [NHANES]    Frequency of Communication with Friends and Family: Not on file    Frequency of Social Gatherings with Friends and Family: Not on file    Attends Religious Services: Never    Active Member of Clubs or Organizations: No    Attends Archivist Meetings: Never    Marital Status: Widowed   Family History  Problem Relation Age of Onset   Hypertension Other    Diabetes Mellitus II Other    Diabetes Mother    Diabetes Sister    Atrial fibrillation Sister    Atrial fibrillation Father    Atrial fibrillation Daughter    No Known Allergies Prior to Admission medications   Medication Sig Start Date End Date Taking? Authorizing Provider  apixaban (ELIQUIS) 5 MG TABS tablet Take 1 tablet (5 mg total) by mouth 2 (two) times daily. 03/20/20  Yes Lavina Hamman, MD   calcium carbonate (TUMS - DOSED IN MG ELEMENTAL CALCIUM) 500 MG chewable tablet Chew 1 tablet by mouth daily as needed for indigestion or heartburn.   Yes [provider]  cholecalciferol (VITAMIN D) 1000 UNITS tablet Take 1,000 Units by mouth daily.   Yes [provider]  clopidogrel (PLAVIX) 75 MG tablet Take 75 mg by mouth daily.   Yes [provider]  docusate sodium (COLACE) 100 MG capsule Take 100 mg by mouth 2 (two) times daily.   Yes [provider]  finasteride (PROSCAR) 5 MG tablet Take 5 mg by mouth daily.   Yes [provider]  furosemide (LASIX) 40 MG tablet Take 20 mg by mouth 2 (two) times daily.   Yes [provider]  gabapentin (NEURONTIN) 100 MG capsule  Take 100 mg by mouth 3 (three) times daily.   Yes [provider]  icosapent Ethyl (VASCEPA) 1 g capsule Take 2 g by mouth 2 (two) times daily.    Yes [provider]  insulin aspart (NOVOLOG) 100 UNIT/ML injection Inject 20 Units into the skin 3 (three) times daily before meals. Patient taking differently: Inject 20-25 Units into the skin See admin instructions. Inject 30 units before breakfast, 30 units before lunch, and 35 units before supper 03/20/20  Yes Lavina Hamman, MD  insulin glargine (LANTUS) 100 UNIT/ML injection Inject 0.9 mLs (90 Units total) into the skin at bedtime. Home med. Patient taking differently: Inject 100 Units into the skin at bedtime. Home med. 06/17/22  Yes Enzo Bi, MD  metFORMIN (GLUCOPHAGE) 1000 MG tablet Take 1,000 mg by mouth 2 (two) times daily with a meal.    Yes [provider]  metoprolol tartrate (LOPRESSOR) 25 MG tablet Take 12.5 mg by mouth 2 (two) times daily.   Yes [provider]  nortriptyline (PAMELOR) 10 MG capsule Take 30 mg by mouth at bedtime.    Yes [provider]  PARoxetine (PAXIL) 40 MG tablet Take 60 mg by mouth daily.    Yes [provider]  rOPINIRole (REQUIP) 0.5 MG  tablet Take 0.5 mg by mouth at bedtime.   Yes [provider]  Semaglutide, 2 MG/DOSE, 8 MG/3ML SOPN Inject 2 mg into the skin every 7 (seven) days. 10/27/22  Yes [provider]  simvastatin (ZOCOR) 40 MG tablet Take 20 mg by mouth at bedtime.   Yes [provider]  tamsulosin (FLOMAX) 0.4 MG CAPS capsule Take 0.4 mg by mouth daily after lunch.    Yes [provider]  vitamin B-12 (CYANOCOBALAMIN) 1000 MCG tablet Take 1,000 mcg by mouth at bedtime.   Yes [provider]  metoprolol tartrate (LOPRESSOR) 25 MG tablet Take 25 mg by mouth 2 (two) times daily. Patient not taking: Reported on 11/23/2022 11/10/22   [provider]  TRULICITY 4.5 ZC/5.8IF SOPN Inject 4.5 mg into the skin once a week. Sunday Patient not taking: Reported on 11/23/2022 05/09/22   [provider]   DG Foot Complete Right  Result Date: 11/23/2022 CLINICAL DATA:  High glucose recent toe removal EXAM: RIGHT FOOT COMPLETE - 3+ VIEW COMPARISON:  06/13/2022 FINDINGS: Patient is status post partial amputation of the first and second digits at the level of the proximal phalanges. Cut margins appear smooth. Mild degenerative change at the first MTP joint. Peripheral gas collection at the third digit. Limited evaluation of third digit distal phalanx due to flexed positioning. IMPRESSION: 1. Status post partial amputation of the first and second digits at the level of the proximal phalanges. Cut margins appear smooth and no definite acute osseous abnormality at these locations 2. Peripheral gas collection at the third digit could be due to bandage material or potential soft tissue ulcer, correlate with direct inspection. Electronically Signed   By: Donavan Foil M.D.   On: 11/23/2022 21:52    Positive ROS: All other systems have been reviewed and were otherwise negative with the exception of those mentioned in the HPI and as above.  12 point ROS was performed.  Physical  Exam: General: Alert and oriented.  No apparent distress.  Vascular:  Left foot:Dorsalis Pedis:  diminished Posterior Tibial:  diminished  Right foot: Dorsalis Pedis:  thready Posterior Tibial:  thready capillary fill time is brisk.  I evaluated previous ABIs back  in October that had decent flow with biphasic waveforms with pressures around 0.7-0.8.  Neuro: Absent protective sensation  Derm: No wound to his left foot. Right third toe distally has an ulceration with noted edema and erythema.  Ortho/MS: Status post left great toe and second toe amputation.  Noted edema diffusely to the right third toe.  Ulceration probes deep to the bone.  I personally evaluated the x-rays today.  No obvious destructive changes.  He status post partial great toe amputation and second toe amputation.  Assessment: Neuropathic ulceration distal right third toe with infection Diabetes with neuropathy   Plan: He has an obvious neuropathic ulceration on the distal aspect of his right third toe.  He has obvious infection distally from a clinical standpoint.  On x-ray there is no signs of obvious osteomyelitis.  Unfortunately has some necrotic tissue in this area.  I will recommend amputation of the distal right third and prophylactic amputation of the fourth toe as this will cause the fourth toe to be quite prominent if we do not amputate at least a portion of this.  He has undergone previous amputation of his great toes and second toe in the past.  His most recent ABIs have been adequate as recent as October.  He has a nice warm foot with good capillary fill time with palpable pulses.  Can consult vascular surgery if we have difficulty with wound complications.  I will place orders for surgery tomorrow.      Elesa Hacker, DPM Cell [phone]   11/24/2022 12:25 PM

## 2022-11-24 NOTE — Assessment & Plan Note (Signed)
Home Eliquis is being held for surgery tomorrow. -Continue with metoprolol

## 2022-11-24 NOTE — Assessment & Plan Note (Signed)
-  Continue home finasteride

## 2022-11-24 NOTE — Assessment & Plan Note (Signed)
Seems like patient was not taking his insulin regularly, CBG 81 after getting home dose of Lantus 90 units last night. -Decrease Semglee to 50 units. -Continue with SSI

## 2022-11-24 NOTE — Assessment & Plan Note (Signed)
S/p CABG. -Holding home Plavix for surgery tomorrow -Continue with metoprolol and statin

## 2022-11-24 NOTE — Inpatient Diabetes Management (Signed)
Inpatient Diabetes Program Recommendations  AACE/ADA: New Consensus Statement on Inpatient Glycemic Control (2015)  Target Ranges:  Prepandial:   less than 140 mg/dL      Peak postprandial:   less than 180 mg/dL (1-2 hours)      Critically ill patients:  140 - 180 mg/dL   Lab Results  Component Value Date   GLUCAP 81 11/24/2022   HGBA1C 7.2 (H) 03/19/2020    Latest Reference Range & Units 11/23/22 18:06 11/23/22 22:37 11/24/22 08:05  Glucose-Capillary 70 - 99 mg/dL 370 (H) 254 (H) 81  (H): Data is abnormally high  Diabetes history: DM2 Outpatient Diabetes medications: Lantus 100 units q hs, Novolog 30 ac breakfast, 30 units ac lunch, 35 ac dinner Current orders for Inpatient glycemic control: Semglee 90 units q hs, Novolog 0-9 units tid correction, 0-5 units hs  Inpatient Diabetes Program Recommendations:   Fasting CBG 81 with Semglee 40 units last hs. Please consider: -Change current Semglee dose to 40 units q hs and review fasting in am.  Thank you, Nani Gasser. Nickola Lenig, RN, MSN, CDE  Diabetes Coordinator Inpatient Glycemic Control Team Team Pager [phone] (8am-5pm) 11/24/2022 12:21 PM

## 2022-11-24 NOTE — Progress Notes (Addendum)
Progress Note   Patient: [name] NUU:[id number] DOB: 09-16-1946 DOA: 11/23/2022     1 DOS: the patient was seen and examined on 11/24/2022   Brief hospital course: [name] is a 76 year old male with history of insulin-dependent diabetes mellitus, depression, peripheral neuropathy, paroxysmal atrial flutter, CAD, hypertension, BPH, who presents emergency department for chief concerns of abnormal labs outpatient. He reports the 3rd right toe has been bothering him for about two weeks.  He denies known trauma.  No fever or chills.  Initial vitals in the emergency department showed temperature of 98.2, respiration rate of 20, heart rate of 75, blood pressure 119/59, SpO2 of 99% on room air.  Serum sodium is 135, potassium 5.3, chloride of 103, bicarb 21, BUN of 36, serum creatinine of 1.76, EGFR 39, nonfasting blood glucose 339, anion gap was 11, WBC 9.0, chloride of 10.1, platelets of 354.  Lactic acid elevated at 2.8.  Lipase was negative.  Magnesium level was 2.7.  Right foot x-ray was read as: Peripheral gas collection at the third digit could be due to bandage material or potential soft tissue ulcer.  ED treatment: Cefepime, vancomycin, sodium chloride 1 L bolus.  Podiatry was consulted.  11/28: Elevated blood pressure.  Pain can be contributory.  Pertinent labs for decrease in hemoglobin to 8.6 from 10.1 on admission, all cell lines decreased so some dilutional effect with IV fluid.  Renal functions seems improving, still little above baseline of 1.1-1.2.  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  Podiatry saw him and planning to take him to the OR tomorrow for third and fourth toe amputation.  We will continue with antibiotics per podiatry recommendations.  Assessment and Plan: * Osteomyelitis (HCC) Right third toe With elevated lactic acid - Status post amputation via Dr. Vickki Muff about 2 months ago and is positive for purulent discharge - Continue with cefepime and  vancomycin - Podiatry is on board and he will be going to the OR for the third and fourth toe amputation tomorrow.      AKI (acute kidney injury) ([place]) History of CKD stage IIIa. Creatinine seems improving but still above the baseline of 1.1-1.2. -Monitor renal function -Avoid nephrotoxins  HTN (hypertension) Blood pressure elevated. - Resumed home metoprolol tartrate 12.5 mg p.o. twice daily for 11/24/2022 - Hydralazine 25 mg p.o. every 6 hours as needed for SBP greater than 160,   Type II diabetes mellitus with renal manifestations (HCC) Seems like patient was not taking his insulin regularly, CBG 81 after getting home dose of Lantus 90 units last night. -Decrease Semglee to 50 units. -Continue with SSI  Diabetic neuropathy (HCC) - Gabapentin 100 mg p.o. 3 times daily resumed  Atrial flutter, paroxysmal (HCC) Home Eliquis is being held for surgery tomorrow. -Continue with metoprolol  CAD (coronary artery disease) S/p CABG. -Holding home Plavix for surgery tomorrow -Continue with metoprolol and statin  HLD (hyperlipidemia) - Simvastatin 40 mg nightly resumed  BPH (benign prostatic hyperplasia) -Continue home finasteride  Depression - Resumed home paroxetine 60 mg daily, nortriptyline 30 mg nightly  Restless leg syndrome - Ropinirole 0.5 mg nightly resumed  Class 2 severe obesity due to excess calories with serious comorbidity and body mass index (BMI) of 35.0 to 35.9 in adult [facility]) Estimated body mass index is 30.65 kg/m as calculated from the following:   Height as of this encounter: 6' (1.829 m).   Weight as of this encounter: 102.5 kg.   Subjective: Patient was seen and examined today.  Resting comfortably.  Denies any pain.  Physical Exam: Vitals:   11/24/22 1238 11/24/22 1238 11/24/22 1400 11/24/22 1416  BP:  (!) 158/81 (!) 148/58   Pulse:  78 73   Resp:  18 17   Temp: 97.6 F (36.4 C)   97.7 F (36.5 C)  TempSrc: Oral   Oral  SpO2:  93% 95%    Weight:      Height:       General.  Obese gentleman, in no acute distress. Pulmonary.  Lungs clear bilaterally, normal respiratory effort. CV.  Regular rate and rhythm, no JVD, rub or murmur. Abdomen.  Soft, nontender, nondistended, BS positive. CNS.  Alert and oriented .  No focal neurologic deficit. Extremities.  No edema, no cyanosis, pulses intact and symmetrical.  Right third and fourth toe with ulceration. Psychiatry.  Judgment and insight appears normal.   Data Reviewed: Prior data reviewed  Family Communication: Tried calling son with no response  Disposition: Status is: Inpatient Remains inpatient appropriate because: Severity of illness  Planned Discharge Destination: Home with Home Health  Time spent: 50 minutes  This record has been created using Systems analyst. Errors have been sought and corrected,but may not always be located. Such creation errors do not reflect on the standard of care.   Author: Lorella Nimrod, MD 11/24/2022 2:44 PM  For on call review [email or web address].

## 2022-11-25 ENCOUNTER — Inpatient Hospital Stay: Payer: Medicare Other | Admitting: Anesthesiology

## 2022-11-25 ENCOUNTER — Other Ambulatory Visit: Payer: Self-pay

## 2022-11-25 ENCOUNTER — Encounter
Admission: EM | Disposition: A | Discharge status: Home Health | Payer: Self-pay | Source: Home / Self Care | Attending: Internal Medicine

## 2022-11-25 DIAGNOSIS — M86271 Subacute osteomyelitis, right ankle and foot: Secondary | ICD-10-CM | POA: Diagnosis not present

## 2022-11-25 HISTORY — PX: AMPUTATION TOE: SHX6595

## 2022-11-25 LAB — HEMOGLOBIN A1C
Hgb A1c MFr Bld: 11 % — ABNORMAL HIGH (ref 4.8–5.6)
Mean Plasma Glucose: 269 mg/dL

## 2022-11-25 LAB — GLUCOSE, CAPILLARY
Glucose-Capillary: 256 mg/dL — ABNORMAL HIGH (ref 70–99)
Glucose-Capillary: 229 mg/dL — ABNORMAL HIGH (ref 70–99)
Glucose-Capillary: 195 mg/dL — ABNORMAL HIGH (ref 70–99)
Glucose-Capillary: 193 mg/dL — ABNORMAL HIGH (ref 70–99)
Glucose-Capillary: 181 mg/dL — ABNORMAL HIGH (ref 70–99)
Glucose-Capillary: 262 mg/dL — ABNORMAL HIGH (ref 70–99)

## 2022-11-25 LAB — VANCOMYCIN, RANDOM: Vancomycin Rm: 18 ug/mL

## 2022-11-25 SURGERY — AMPUTATION, TOE
Anesthesia: General | Site: Toe | Laterality: Right

## 2022-11-25 MED ORDER — SODIUM CHLORIDE 0.9 % IV SOLN: INTRAVENOUS | Status: DC | PRN

## 2022-11-25 MED ORDER — HYDROMORPHONE HCL 1 MG/ML IJ SOLN: 0.2500 mg | INTRAMUSCULAR | Status: DC | PRN

## 2022-11-25 MED ORDER — LIDOCAINE HCL (CARDIAC) PF 100 MG/5ML IV SOSY
PREFILLED_SYRINGE | INTRAVENOUS | Status: DC | PRN
  Administered 2022-11-25: 100 mg via INTRAVENOUS

## 2022-11-25 MED ORDER — OXYCODONE HCL 5 MG/5ML PO SOLN: 5.0000 mg | Freq: Once | ORAL | Status: DC | PRN

## 2022-11-25 MED ORDER — ACETAMINOPHEN 10 MG/ML IV SOLN
INTRAVENOUS | Status: AC
  Filled 2022-11-25: qty 100

## 2022-11-25 MED ORDER — LIDOCAINE HCL (PF) 1 % IJ SOLN
INTRAMUSCULAR | Status: DC | PRN
  Administered 2022-11-25: 10 mL

## 2022-11-25 MED ORDER — 0.9 % SODIUM CHLORIDE (POUR BTL) OPTIME
TOPICAL | Status: DC | PRN
  Administered 2022-11-25: 500 mL

## 2022-11-25 MED ORDER — MIDAZOLAM HCL 2 MG/2ML IJ SOLN
INTRAMUSCULAR | Status: AC
  Filled 2022-11-25: qty 2

## 2022-11-25 MED ORDER — FENTANYL CITRATE (PF) 100 MCG/2ML IJ SOLN
INTRAMUSCULAR | Status: AC
  Filled 2022-11-25: qty 2

## 2022-11-25 MED ORDER — LOSARTAN POTASSIUM 50 MG PO TABS
50.0000 mg | ORAL_TABLET | Freq: Every day | ORAL | Status: DC
  Administered 2022-11-25 – 2022-11-28 (×4): 50 mg via ORAL
  Filled 2022-11-25 (×4): qty 1

## 2022-11-25 MED ORDER — FENTANYL CITRATE (PF) 100 MCG/2ML IJ SOLN
INTRAMUSCULAR | Status: DC | PRN
  Administered 2022-11-25: 50 ug via INTRAVENOUS

## 2022-11-25 MED ORDER — HYDRALAZINE HCL 20 MG/ML IJ SOLN
10.0000 mg | Freq: Once | INTRAMUSCULAR | Status: AC
  Administered 2022-11-25: 10 mg via INTRAVENOUS

## 2022-11-25 MED ORDER — BUPIVACAINE HCL (PF) 0.5 % IJ SOLN
INTRAMUSCULAR | Status: AC
  Filled 2022-11-25: qty 30

## 2022-11-25 MED ORDER — PROPOFOL 10 MG/ML IV BOLUS
INTRAVENOUS | Status: AC
  Filled 2022-11-25: qty 20

## 2022-11-25 MED ORDER — VANCOMYCIN HCL 1250 MG/250ML IV SOLN
1250.0000 mg | INTRAVENOUS | Status: DC
  Administered 2022-11-25: 1250 mg via INTRAVENOUS
  Filled 2022-11-25: qty 250

## 2022-11-25 MED ORDER — ACETAMINOPHEN 10 MG/ML IV SOLN
INTRAVENOUS | Status: DC | PRN
  Administered 2022-11-25: 1000 mg via INTRAVENOUS

## 2022-11-25 MED ORDER — HYDRALAZINE HCL 20 MG/ML IJ SOLN
INTRAMUSCULAR | Status: AC
  Filled 2022-11-25: qty 1

## 2022-11-25 MED ORDER — PROPOFOL 1000 MG/100ML IV EMUL
INTRAVENOUS | Status: AC
  Filled 2022-11-25: qty 100

## 2022-11-25 MED ORDER — PROPOFOL 10 MG/ML IV BOLUS
INTRAVENOUS | Status: DC | PRN
  Administered 2022-11-25: 30 mg via INTRAVENOUS

## 2022-11-25 MED ORDER — OXYCODONE HCL 5 MG PO TABS: 5.0000 mg | ORAL_TABLET | Freq: Once | ORAL | Status: DC | PRN

## 2022-11-25 MED ORDER — PROPOFOL 500 MG/50ML IV EMUL
INTRAVENOUS | Status: DC | PRN
  Administered 2022-11-25: 75 ug/kg/min via INTRAVENOUS

## 2022-11-25 MED ORDER — LIDOCAINE HCL (PF) 1 % IJ SOLN
INTRAMUSCULAR | Status: AC
  Filled 2022-11-25: qty 30

## 2022-11-25 SURGICAL SUPPLY — 44 items
BLADE OSC/SAGITTAL MD 5.5X18 (BLADE) ×1 IMPLANT
BLADE SURG MINI STRL (BLADE) ×1 IMPLANT
BNDG ELASTIC 4X5.8 VLCR NS LF (GAUZE/BANDAGES/DRESSINGS) ×1 IMPLANT
BNDG ESMARK 4X12 TAN STRL LF (GAUZE/BANDAGES/DRESSINGS) ×1 IMPLANT
BNDG GAUZE DERMACEA FLUFF 4 (GAUZE/BANDAGES/DRESSINGS) ×1 IMPLANT
BNDG STRETCH GAUZE 3IN X12FT (GAUZE/BANDAGES/DRESSINGS) ×2 IMPLANT
CUFF TOURN SGL QUICK 12 (TOURNIQUET CUFF) IMPLANT
CUFF TOURN SGL QUICK 18X4 (TOURNIQUET CUFF) IMPLANT
DRAPE FLUOR MINI C-ARM 54X84 (DRAPES) ×1 IMPLANT
DRAPE XRAY CASSETTE 23X24 (DRAPES) ×1 IMPLANT
DURAPREP 26ML APPLICATOR (WOUND CARE) ×1 IMPLANT
ELECT REM PT RETURN 9FT ADLT (ELECTROSURGICAL) ×1
ELECTRODE REM PT RTRN 9FT ADLT (ELECTROSURGICAL) ×1 IMPLANT
GAUZE PACKING IODOFORM 1/2INX (GAUZE/BANDAGES/DRESSINGS) ×1 IMPLANT
GAUZE SPONGE 4X4 12PLY STRL (GAUZE/BANDAGES/DRESSINGS) ×1 IMPLANT
GAUZE STRETCH 2X75IN STRL (MISCELLANEOUS) ×1 IMPLANT
GAUZE XEROFORM 1X8 LF (GAUZE/BANDAGES/DRESSINGS) ×1 IMPLANT
GLOVE BIO SURGEON STRL SZ7.5 (GLOVE) ×1 IMPLANT
GLOVE SURG UNDER LTX SZ8 (GLOVE) ×1 IMPLANT
GOWN STRL REUS W/ TWL XL LVL3 (GOWN DISPOSABLE) ×2 IMPLANT
GOWN STRL REUS W/TWL XL LVL3 (GOWN DISPOSABLE) ×2
IV NS IRRIG 3000ML ARTHROMATIC (IV SOLUTION) ×1 IMPLANT
KIT TURNOVER KIT A (KITS) ×1 IMPLANT
LABEL OR SOLS (LABEL) ×1 IMPLANT
MANIFOLD NEPTUNE II (INSTRUMENTS) ×1 IMPLANT
NDL FILTER BLUNT 18X1 1/2 (NEEDLE) ×1 IMPLANT
NDL HYPO 25X1 1.5 SAFETY (NEEDLE) ×1 IMPLANT
NEEDLE FILTER BLUNT 18X1 1/2 (NEEDLE) ×1 IMPLANT
NEEDLE HYPO 25X1 1.5 SAFETY (NEEDLE) ×1 IMPLANT
NS IRRIG 500ML POUR BTL (IV SOLUTION) ×1 IMPLANT
PACK EXTREMITY ARMC (MISCELLANEOUS) ×1 IMPLANT
PAD ABD DERMACEA PRESS 5X9 (GAUZE/BANDAGES/DRESSINGS) ×2 IMPLANT
PULSAVAC PLUS IRRIG FAN TIP (DISPOSABLE)
SHIELD FULL FACE ANTIFOG 7M (MISCELLANEOUS) ×1 IMPLANT
STOCKINETTE M/LG 89821 (MISCELLANEOUS) ×1 IMPLANT
STRAP SAFETY 5IN WIDE (MISCELLANEOUS) ×1 IMPLANT
SUT ETHILON 3-0 FS-10 30 BLK (SUTURE) ×1
SUT ETHILON 5-0 FS-2 18 BLK (SUTURE) ×1 IMPLANT
SUT VIC AB 4-0 FS2 27 (SUTURE) ×1 IMPLANT
SUTURE EHLN 3-0 FS-10 30 BLK (SUTURE) ×1 IMPLANT
SYR 10ML LL (SYRINGE) ×3 IMPLANT
TIP FAN IRRIG PULSAVAC PLUS (DISPOSABLE) ×1 IMPLANT
TRAP FLUID SMOKE EVACUATOR (MISCELLANEOUS) ×1 IMPLANT
WATER STERILE IRR 500ML POUR (IV SOLUTION) ×1 IMPLANT

## 2022-11-25 NOTE — Op Note (Signed)
Operative note   Surgeon:Leonardo Plaia    Assistant:None    Preop diagnosis: Diabetic foot ulcer with osteomyelitis third toe right foot    Postop diagnosis: Same    Procedure: 1.  Partial amputation right second toe 2.  Partial amputation right third toe    EBL: Minimal    Anesthesia:local and general.  Local consisted of a total of 5 cc of 1% plain lidocaine and 0.5% bupivacaine    Hemostasis: None    Specimen: Third toe for pathology and deep wound culture    Complications: None    Operative indications:[name] is an 76 y.o. that presents today for surgical intervention.  The risks/benefits/alternatives/complications have been discussed and consent has been given.  The patient has a open necrotic wound on the distal aspect of his right third toe that probes directly down to bone.  He is undergoing previous great toe and second toe amputation.  We discussed amputation of the third toe as well as partial amputation of the fourth toe to try to give him more of a normal parabola of the digits on the distal aspect of his right foot as he continues to breakdown due to prominence once a toe has been amputated.  He understands this and consented to the surgery.    Procedure:  Patient was brought into the OR and placed on the operating table in thesupine position. After anesthesia was obtained theright lower extremity was prepped and draped in usual sterile fashion.  Attention was directed to the distal aspect of the right forefoot where the necrotic third toe was noted.  A flap was created proximal to the PIPJ.  A osteotomy was created through the proximal phalanx.  The toe was then removed from the surgical field in toto.  A deep wound culture was performed and the remainder of the toe was sent for pathological examination.  Next full-thickness flaps were created medial and lateral to the fourth toe along the midshaft of the proximal phalanx.  Full-thickness flaps were created.   The midshaft of the proximal phalanx osteotomy was created.  The distal portion of the fourth toe was removed.  All wounds were flushed with copious amounts of irrigation.  Closure was then performed with a 3-0 nylon.  A bulky sterile dressing was applied.    Patient tolerated the procedure and anesthesia well.  Was transported from the OR to the PACU with all vital signs stable and vascular status intact. To be discharged per routine protocol to the floor.

## 2022-11-25 NOTE — Anesthesia Postprocedure Evaluation (Signed)
Anesthesia Post Note  Patient: [name]  Procedure(s) Performed: AMPUTATION RIGHT 3RD AND 4TH TOES (Right: Toe)  Patient location during evaluation: PACU Anesthesia Type: General Level of consciousness: awake and alert Pain management: pain level controlled Vital Signs Assessment: post-procedure vital signs reviewed and stable Respiratory status: spontaneous breathing, nonlabored ventilation and respiratory function stable Cardiovascular status: blood pressure returned to baseline and stable Postop Assessment: no apparent nausea or vomiting Anesthetic complications: no   No notable events documented.   Last Vitals:  Vitals:   11/25/22 1737 11/25/22 1950  BP: (!) 157/84 (!) 151/76  Pulse: 71 (!) 109  Resp:  17  Temp:  36.8 C  SpO2:  97%    Last Pain:  Vitals:   11/25/22 1950  TempSrc: Oral  PainSc:                  [place] Ouch

## 2022-11-25 NOTE — Inpatient Diabetes Management (Signed)
[facility] and spoke with receptionist.  Let the clinic know pt is currently in the hospital and will not be able to make his appt with [name] today at 3:15pm.     --Will follow patient during hospitalization--  Wyn Quaker RN, MSN, [place] Diabetes Coordinator Inpatient Glycemic Control Team Team Pager: [phone] (8a-5p)

## 2022-11-25 NOTE — Transfer of Care (Signed)
Immediate Anesthesia Transfer of Care Note  Patient: [name]  Procedure(s) Performed: AMPUTATION RIGHT 3RD AND 4TH TOES (Right: Toe)  Patient Location: PACU  Anesthesia Type:General  Level of Consciousness: drowsy  Airway & Oxygen Therapy: Patient Spontanous Breathing  Post-op Assessment: Report given to RN and Post -op Vital signs reviewed and stable  Post vital signs: Reviewed and stable  Last Vitals:  Vitals Value Taken Time  BP    Temp    Pulse 72 11/25/22 1539  Resp 13 11/25/22 1539  SpO2 97 % 11/25/22 1539  Vitals shown include unvalidated device data.  Last Pain:  Vitals:   11/25/22 1427  TempSrc: Oral  PainSc: 0-No pain         Complications: No notable events documented.

## 2022-11-25 NOTE — Assessment & Plan Note (Addendum)
Right third toe With elevated lactic acid - Status post amputation via Dr. Ether Griffins about 2 months ago and is positive for purulent discharge S/p another amputation involving third toe and partial fourth toe on 11/25/2022 Preliminary cultures growing gram-positive cocci in pair. - Continue with cefepime and vancomycin -Follow-up final culture results-podiatry is recommending continuation of antibiotics for 5 more days on discharge.

## 2022-11-25 NOTE — Anesthesia Preprocedure Evaluation (Addendum)
Anesthesia Evaluation  Patient identified by MRN, date of birth, ID band Patient awake    Reviewed: Allergy & Precautions, NPO status , Patient's Chart, lab work & pertinent test results  History of Anesthesia Complications Negative for: history of anesthetic complications  Airway Mallampati: II  TM Distance: >3 FB Neck ROM: full    Dental  (+) Poor Dentition   Pulmonary former smoker   Pulmonary exam normal        Cardiovascular hypertension, + CAD, + CABG, + Peripheral Vascular Disease and +CHF  Normal cardiovascular exam+ dysrhythmias Atrial Fibrillation      Neuro/Psych  PSYCHIATRIC DISORDERS Anxiety Depression    TIA Neuromuscular disease CVA    GI/Hepatic negative GI ROS, Neg liver ROS,,,  Endo/Other  diabetes, Poorly Controlled, Insulin Dependent    Renal/GU Renal disease     Musculoskeletal   Abdominal   Peds  Hematology negative hematology ROS (+)   Anesthesia Other Findings Past Medical History: No date: Atrial fibrillation (HCC)     Comment:  a.) CHA2DS2VASc = 6 (age x 2,  HTN, CVA/TIA x 2, T2DM);               b.) s/p DCCV 09/25/2020 (120 J x1); c.) rate/rhythm               maintained on oral metoprolol tartrate; chronically               anticoagulated with apixaban No date: BPH (benign prostatic hyperplasia) 10/28/2016: CAD (coronary artery disease)     Comment:  a.) LHC 10/28/2016: 95% dLAD, 90% p-mLCx, 75% mLAD, 60%               m-dLAD, 99% p-mRCA, 75% dRCA --> consult CVTS; b.) 3v               CABG at [facility] 11/05/2016 06/29/2016: Carotid artery disease ([place])     Comment:  a.) carotid US 06/29/2016: <50% BICA 11/13/2015: Cerebellar stroke (HCC)     Comment:  a.) CT imaging 11/13/2015 revealed old/remote LEFT               cerebellar and LEFT carona radiata infarcts No date: CHF (congestive heart failure) (HCC) No date: Depression No date: Diabetic foot ulcers ([place]) 26/37/8588: Diastolic  dysfunction     Comment:  a.) TTE 06/28/2016: EF 55-60%, G1DD; b.) TTE 03/19/2020:              EF 55-60%, mild BAE, mild-mod MR/TR. No date: HLD (hyperlipidemia) No date: Hypertension No date: Long term (current) use of anticoagulants     Comment:  a.) apixaban No date: Long term current use of antithrombotics/antiplatelets     Comment:  a.) clopidogrel No date: Neuropathy No date: PTSD (post-traumatic stress disorder) No date: Restless leg syndrome     Comment:  a.) on ropinirole 06/28/2016: Right pontine stroke ([place])     Comment:  a.) MRI 06/28/2016: acute RIGHT paracentral pontine               (non-hemorrhagic) CVA 11/05/2016: S/P CABG x 3     Comment:  a.) LIMA-LAD, SVG-OM1, SVG-PDA No date: TIA (transient ischemic attack)     Comment:  a.) multiple since last CVA in 2017 per daughter No date: Tubular adenoma of colon No date: Type 2 diabetes mellitus treated with insulin [facility])  Past Surgical History: 06/14/2022: AMPUTATION TOE; Right     Comment:  Procedure: RIGHT GREAT TOE AMPUTATION;  Surgeon: Vickki Muff,  Larkin Ina, DPM;  Location: [facility] ORS;  Service: Podiatry;                Laterality: Right; 09/25/2022: AMPUTATION TOE; Right     Comment:  Procedure: AMPUTATION TOE - SECOND;  Surgeon: Samara Deist, DPM;  Location: [facility] ORS;  Service: Podiatry;                Laterality: Right; 10/28/2016: CARDIAC CATHETERIZATION; Left     Comment:  Procedure: Left Heart Cath and Coronary Angiography;                Surgeon: Corey Skains, MD;  Location: [place]               CV LAB;  Service: Cardiovascular;  Laterality: Left; 09/25/2020: CARDIOVERSION; N/A     Comment:  Procedure: CARDIOVERSION;  Surgeon: Corey Skains,               MD;  Location: [facility] ORS;  Service: Cardiovascular;                Laterality: N/A; 09/02/2022: COLONOSCOPY; N/A     Comment:  Procedure: COLONOSCOPY;  Surgeon: Toledo, Benay Pike, MD;              Location: [facility]  ENDOSCOPY;  Service: Gastroenterology;                Laterality: N/A;  IDDM 06/16/2022: LOWER EXTREMITY ANGIOGRAPHY; Right     Comment:  Procedure: Lower Extremity Angiography;  Surgeon:               Katha Cabal, MD;  Location: [place] CV LAB;               Service: Cardiovascular;  Laterality: Right; No date: SHOULDER ARTHROSCOPY No date: TRIGGER FINGER RELEASE No date: UVULOPALATOPHARYNGOPLASTY, TONSILLECTOMY AND SEPTOPLASTY  BMI    Body Mass Index: 29.97 kg/m      Reproductive/Obstetrics negative OB ROS                             Anesthesia Physical Anesthesia Plan  ASA: 3  Anesthesia Plan: General ETT   Post-op Pain Management:    Induction: Intravenous  PONV Risk Score and Plan: Ondansetron, Dexamethasone, Midazolam and Treatment may vary due to age or medical condition  Airway Management Planned: Natural Airway and Nasal Cannula  Additional Equipment:   Intra-op Plan:   Post-operative Plan:   Informed Consent: I have reviewed the patients History and Physical, chart, labs and discussed the procedure including the risks, benefits and alternatives for the proposed anesthesia with the patient or authorized representative who has indicated his/her understanding and acceptance.     Dental Advisory Given  Plan Discussed with: Anesthesiologist, CRNA and Surgeon  Anesthesia Plan Comments: (Patient consented for risks of anesthesia including but not limited to:  - adverse reactions to medications - risk of airway placement if required - damage to eyes, teeth, lips or other oral mucosa - nerve damage due to positioning  - sore throat or hoarseness - Damage to heart, brain, nerves, lungs, other parts of body or loss of life  Patient voiced understanding.)       Anesthesia Quick Evaluation

## 2022-11-25 NOTE — Progress Notes (Signed)
Pharmacy Antibiotic Note  [name] is a 76 y.o. male s/p amputation of third toe on the right in late September admitted on 11/23/2022 with cellulitis.  Pharmacy has been consulted for vancomycin and cefepime dosing. A level was drawn following vancomycin 2000 mg IV x 1 11/24/22 2322 (random level: 0848 18 mcg/mL)  Plan:  1) adjust vancomycin to 1250 mg IV every 24 hours --Goal AUC 400-550. --Expected AUC: 525.0 --SCr used: 1.34 mg/dL --daily serum creatinine while on IV vancomycin  2) continue cefepime 2 grams IV every 12 hours   Height: 6' (182.9 cm) Weight: 102.5 kg (226 lb) IBW/kg (Calculated) : 77.6  Temp (24hrs), Avg:97.9 F (36.6 C), Min:97.6 F (36.4 C), Max:98.5 F (36.9 C)  Recent Labs  Lab 11/23/22 1615 11/23/22 1815 11/23/22 2210 11/24/22 0812  WBC 9.0  --   --  7.5  CREATININE 1.76*  --   --  1.34*  LATICACIDVEN  --  2.8* 1.8  --      Estimated Creatinine Clearance: 57.2 mL/min (A) (by C-G formula based on SCr of 1.34 mg/dL (H)).    No Known Allergies  Antimicrobials this admission: 11/27 vancomycin >>  11/27 cefepime >>   Microbiology results: 11/27 BCx: NGTD (pending) 11/28 MRSA PCR negative  Thank you for allowing pharmacy to be a part of this patient's care.  Dallie Piles 11/25/2022 6:59 AM

## 2022-11-25 NOTE — Assessment & Plan Note (Signed)
Blood pressure remained elevated. - Resumed home metoprolol tartrate 12.5 mg p.o. twice daily for 11/24/2022 - Hydralazine 25 mg p.o. every 6 hours as needed for SBP greater than 160,  -Continue losartan 50 mg daily -Add amlodipine 10 mg daily

## 2022-11-25 NOTE — TOC Initial Note (Signed)
Transition of Care [facility]) - Initial/Assessment Note    Patient Details  Name: [name] MRN: [id number] Date of Birth: 1946/03/16  Transition of [facility]) CM/SW Contact:    Beverly Sessions, RN Phone Number: 11/25/2022, 12:25 PM  Clinical Narrative:                       Admitted for: r/o osteo.  Plan for OR tomorrow. Currently on IV Antibiotics   Admitted from: home alone P[facility] [place] Current home health/prior home health/DME:[facility] ([facility]);[facility]  Previously has had [place] home health   Patient Goals and CMS Choice        Expected Discharge Plan and Services                                                Prior Living Arrangements/Services                       Activities of Daily Living Home Assistive Devices/Equipment: None ADL Screening (condition at time of admission) Patient's cognitive ability adequate to safely complete daily activities?: Yes Is the patient deaf or have difficulty hearing?: No Does the patient have difficulty seeing, even when wearing glasses/contacts?: Yes Does the patient have difficulty concentrating, remembering, or making decisions?: Yes Patient able to express need for assistance with ADLs?: Yes Does the patient have difficulty dressing or bathing?: No Independently performs ADLs?: Yes (appropriate for developmental age) Does the patient have difficulty walking or climbing stairs?: Yes Weakness of Legs: None Weakness of Arms/Hands: None  Permission Sought/Granted                  Emotional Assessment              Admission diagnosis:  Hyperkalemia [E87.5] Osteomyelitis ([place]) [M86.9] Hyperglycemia [R73.9] Toe infection [L08.9] Patient Active Problem List   Diagnosis Date Noted   Osteomyelitis ([place]) 11/23/2022   Restless leg syndrome 11/23/2022   Encounter for screening for malignant neoplasm of colon 06/25/2022   Osteomyelitis of great  toe of right foot ([place]) 06/13/2022   Chronic kidney disease, stage 3a ([place])    Chronic diastolic CHF (congestive heart failure) ([place])    Type II diabetes mellitus with renal manifestations ([place])    Depression    Atrial fibrillation, chronic ([place])    History of colonic polyps 05/05/2022   Chronic daily headache 11/10/2021   History of stroke 11/10/2021   Difficulty sleeping 11/10/2021   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 ([place]) 05/12/2021   Hypersomnia with sleep apnea 05/12/2021   Long term (current) use of anticoagulants 05/12/2021   Other dysfunctions of sleep stages or arousal from sleep 05/12/2021   Posttraumatic stress disorder 05/12/2021   Presbyopia 05/12/2021   Psychosexual dysfunction with inhibited sexual excitement 05/12/2021   Type 2 diabetes mellitus with hyperglycemia ([place]) 05/12/2021   Atherosclerosis of native arteries of the extremities with ulceration ([place]) 01/01/2021   Atrial flutter, paroxysmal ([place]) 03/21/2020   Postural dizziness with presyncope 03/18/2020   Hypoglycemia associated with type 2 diabetes mellitus ([place]) 03/18/2020   Hx of CABG 03/18/2020   History of CVA (cerebrovascular accident) 03/18/2020   Class 2 severe obesity due to excess calories with serious comorbidity and body mass index (BMI) of 35.0 to  35.9 in adult [facility]) 01/12/2020   Diabetic neuropathy ([place]) 01/04/2020   Occlusion and stenosis of vertebral artery 01/04/2020   Sciatica 01/04/2020   Health care maintenance 11/27/2019   Sleeping difficulty 09/13/2019   LVH (left ventricular hypertrophy) due to hypertensive disease, without heart failure 09/22/2018   CAD (coronary artery disease) 11/12/2016   Bilateral carotid artery disease ([place]) 07/14/2016   HTN (hypertension) 06/27/2016   HLD (hyperlipidemia) 06/27/2016   BPH (benign prostatic hyperplasia) 06/27/2016   AKI (acute kidney injury) ([place]) 06/27/2016   Vitamin D deficiency 03/30/2016   Vitamin B 12 deficiency  03/30/2016   At risk for psychosocial dysfunction 12/01/2015   PCP:  [name], MD Pharmacy:   [id number] [place] [place] [id number]: [phone]: [address]Phone: [phone]: [phone]    Social Determinants of Health ([place] Interventions    Readmission Risk Interventions     No data to display

## 2022-11-25 NOTE — Progress Notes (Signed)
Progress Note   Patient: [name] BWL:[id number] DOB: [birth year] DOA: 11/23/2022     2 DOS: the patient was seen and examined on 11/25/2022   Brief hospital course: [name] is a 76 year old male with history of insulin-dependent diabetes mellitus, depression, peripheral neuropathy, paroxysmal atrial flutter, CAD, hypertension, BPH, who presents emergency department for chief concerns of abnormal labs outpatient. He reports the 3rd right toe has been bothering him for about two weeks.  He denies known trauma.  No fever or chills.  Initial vitals in the emergency department showed temperature of 98.2, respiration rate of 20, heart rate of 75, blood pressure 119/59, SpO2 of 99% on room air.  Serum sodium is 135, potassium 5.3, chloride of 103, bicarb 21, BUN of 36, serum creatinine of 1.76, EGFR 39, nonfasting blood glucose 339, anion gap was 11, WBC 9.0, chloride of 10.1, platelets of 354.  Lactic acid elevated at 2.8.  Lipase was negative.  Magnesium level was 2.7.  Right foot x-ray was read as: Peripheral gas collection at the third digit could be due to bandage material or potential soft tissue ulcer.  ED treatment: Cefepime, vancomycin, sodium chloride 1 L bolus.  Podiatry was consulted.  11/28: Elevated blood pressure.  Pain can be contributory.  Pertinent labs for decrease in hemoglobin to 8.6 from 10.1 on admission, all cell lines decreased so some dilutional effect with IV fluid.  Renal functions seems improving, still little above baseline of 1.1-1.2.  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  Podiatry saw him and planning to take him to the OR tomorrow for third and fourth toe amputation.  We will continue with antibiotics per podiatry recommendations.  11/29: Blood pressure remained elevated.  Adding losartan 50 mg daily. Going to the OR later today for third and fourth toe amputation.  Assessment and Plan: * Osteomyelitis (HCC) Right third toe With elevated  lactic acid - Status post amputation via Dr. Vickki [name] about 2 months ago and is positive for purulent discharge - Continue with cefepime and vancomycin - Podiatry is on board and he will be going to the OR for the third and fourth toe amputation today.      AKI (acute kidney injury) ([place]) History of CKD stage IIIa. Creatinine seems improving but still above the baseline of 1.1-1.2. -Monitor renal function -Avoid nephrotoxins  HTN (hypertension) Blood pressure elevated. - Resumed home metoprolol tartrate 12.5 mg p.o. twice daily for 11/24/2022 - Hydralazine 25 mg p.o. every 6 hours as needed for SBP greater than 160,  -Add Losarten 50 mg daily.  Type II diabetes mellitus with renal manifestations (HCC) Seems like patient was not taking his insulin regularly, CBG 81 after getting home dose of Lantus 90 units last night. -Decrease Semglee to 50 units. -Continue with SSI  Diabetic neuropathy (HCC) - Gabapentin 100 mg p.o. 3 times daily resumed  Atrial flutter, paroxysmal (HCC) Home Eliquis is being held for surgery tomorrow. -Continue with metoprolol  CAD (coronary artery disease) S/p CABG. -Holding home Plavix for surgery tomorrow -Continue with metoprolol and statin  HLD (hyperlipidemia) - Simvastatin 40 mg nightly resumed  BPH (benign prostatic hyperplasia) -Continue home finasteride  Depression - Resumed home paroxetine 60 mg daily, nortriptyline 30 mg nightly  Restless leg syndrome - Ropinirole 0.5 mg nightly resumed  Class 2 severe obesity due to excess calories with serious comorbidity and body mass index (BMI) of 35.0 to 35.9 in adult [facility]) Estimated body mass index is 30.65 kg/m as calculated from the  following:   Height as of this encounter: 6' (1.829 m).   Weight as of this encounter: 102.5 kg.   Subjective: Patient was seen and examined today.  No new complaints.  Going to OR today.  Physical Exam: Vitals:   11/24/22 2102 11/25/22 0419 11/25/22 0806  11/25/22 1427  BP: (!) 178/76 (!) 148/64 (!) 190/77 (!) 172/73  Pulse: 93 81 73 76  Resp: _0 Temp: 98.5 F (36.9 C) 97.6 F (36.4 C) 97.8 F (36.6 C) 98.6 F (37 C)  TempSrc: Oral Oral Oral Oral  SpO2: 97% 98% 97% 98%  Weight:    100.2 kg  Height:    6' (1.829 m)   General. Elderly man, In no acute distress. Pulmonary.  Lungs clear bilaterally, normal respiratory effort. CV.  Regular rate and rhythm, no JVD, rub or murmur. Abdomen.  Soft, nontender, nondistended, BS positive. CNS.  Alert and oriented .  No focal neurologic deficit. Extremities.  No edema, no cyanosis, pulses intact and symmetrical. Psychiatry.  Judgment and insight appears normal.   Data Reviewed: Prior data reviewed  Family Communication: Discussed with wife today.  Disposition: Status is: Inpatient Remains inpatient appropriate because: Severity of illness  Planned Discharge Destination: Home with Home Health  Time spent: 45 minutes  This record has been created using Systems analyst. Errors have been sought and corrected,but may not always be located. Such creation errors do not reflect on the standard of care.   Author: Lorella Nimrod, MD 11/25/2022 3:15 PM  For on call review [email or web address].

## 2022-11-25 NOTE — Inpatient Diabetes Management (Signed)
Inpatient Diabetes Program Recommendations  AACE/ADA: New Consensus Statement on Inpatient Glycemic Control (2015)  Target Ranges:  Prepandial:   less than 140 mg/dL      Peak postprandial:   less than 180 mg/dL (1-2 hours)      Critically ill patients:  140 - 180 mg/dL    Latest Reference Range & Units 11/23/22 16:15  Hemoglobin A1C 4.8 - 5.6 % 11.0 (H)  269 mg/dl  (H): Data is abnormally high  Latest Reference Range & Units 11/23/22 18:06 11/23/22 22:37 11/24/22 08:05 11/24/22 12:44 11/24/22 16:15 11/24/22 21:03  Glucose-Capillary 70 - 99 mg/dL 370 (H) 254 (H)  3 units Novolog  40 units Semglee 81 102 (H) 299 (H)  5 units Novolog  254 (H)  3 units Novolog  50 units Semglee _0     Latest Reference Range & Units 11/25/22 07:59 11/25/22 11:09  Glucose-Capillary 70 - 99 mg/dL 256 (H)  5 units Novolog _1  229 (H)  3 units Novolog   (H): Data is abnormally high   Admit with:  Osteomyelitis Right third toe  History: DM2, CKD  Home DM Meds: Lantus 100 units QHS        Novolog 30 ac breakfast, 30 units ac lunch, 35 ac dinner  Current Orders: Semglee 50 units QHS      Novolog Sensitive Correction Scale/ SSI (0-9 units) TID AC + HS   MD- Please consider:  1. Increase Semglee to 60 units QHS  2. Start Novolog Meal Coverage: Novolog 6 units TID with meals (1/5 total home dose to start) HOLD if pt NPO HOLD it pt eats <50% meals   PCP: Dr. Frazier Richards with [facility]  ENDO: [name] with [facility]--Last seen 08/20/2022 Was told the following: Increase Lantus to 100 units daily Switch from Trulicity to Mounjaro 7.5 mg Qweek Take Novolog 30 ac breakfast, 30 units ac lunch, 35 ac dinner Take Metformin 1000 mg BID Scan Libre CGM for sugars at least QAC/HS   Met w. Pt at bedside this AM.  Pt verified last visit with [name] (ENDO) in August and told me he is taking the following: Lantus to 100 units daily Was supposed to Switch from Trulicity to  [facility] 7.5 mg Qweek, however, instead was given Rx to [employer] 2 mg Qweek (has NOT yet started the Ozempic) Novolog 30 ac breakfast, 30 units ac lunch, 35 ac dinner Metformin 1000 mg BID Scanning Libre CGM for sugars at least QAC/HS  Pt stated to me that he often forgets to scan his Libre CGM for a sugar reading prior to meals--Often scans after he has started eating.  Sometimes forgets to take the Novolog pre-meal and will end up taking the Novolog after he has started eating.  Knows the Novolog works better when he takes prior to eating, is just forgetful (in pt's own words).  Has all meds at home.  We discussed the rise in his A1c from 9.3% in August to current A1c of 11%.  Pt was disappointed to hear it was worse.  Told me he had a follow up appt with ENDO [name] today.  I told pt I will call [facility] ENDO and make sure they know he is currently in the hospital.  Pt admitted to me he has been eating poorly--especially around the holiday.  Does well to avoid beverages with sugar (states he drinks mostly diet drinks and water) but told me he can be bad about eating poor food choices like whole cans of  franks and beans and sweets.  Pt stated to me that he knows what to eat, that he has just been making poor choices.  Discussed with pt that he may consider adding an alarm to his phone that will help him remember to scan CBGs and take his insulin pre-meals.  Pt appreciative of visit and thanked me in advance for calling the ENDO office to let them know he is hospitalized.  --Will follow patient during hospitalization--  Wyn Quaker RN, MSN, [place] Diabetes Coordinator Inpatient Glycemic Control Team Team Pager: [phone] (8a-5p)

## 2022-11-26 ENCOUNTER — Encounter: Payer: Self-pay | Admitting: Podiatry

## 2022-11-26 DIAGNOSIS — M86271 Subacute osteomyelitis, right ankle and foot: Secondary | ICD-10-CM | POA: Diagnosis not present

## 2022-11-26 LAB — CBC
HCT: 27.6 % — ABNORMAL LOW (ref 39.0–52.0)
Hemoglobin: 8.8 g/dL — ABNORMAL LOW (ref 13.0–17.0)
MCH: 27.4 pg (ref 26.0–34.0)
MCHC: 31.9 g/dL (ref 30.0–36.0)
MCV: 86 fL (ref 80.0–100.0)
Platelets: 258 10*3/uL (ref 150–400)
RBC: 3.21 MIL/uL — ABNORMAL LOW (ref 4.22–5.81)
RDW: 14.6 % (ref 11.5–15.5)
WBC: 7.9 10*3/uL (ref 4.0–10.5)
nRBC: 0 % (ref 0.0–0.2)

## 2022-11-26 LAB — BASIC METABOLIC PANEL
Anion gap: 3 — ABNORMAL LOW (ref 5–15)
BUN: 16 mg/dL (ref 8–23)
CO2: 22 mmol/L (ref 22–32)
Calcium: 8.9 mg/dL (ref 8.9–10.3)
Chloride: 115 mmol/L — ABNORMAL HIGH (ref 98–111)
Creatinine, Ser: 1.2 mg/dL (ref 0.61–1.24)
GFR, Estimated: >60 mL/min (ref >60)
Glucose, Bld: 213 mg/dL — ABNORMAL HIGH (ref 70–99)
Potassium: 4.4 mmol/L (ref 3.5–5.1)
Sodium: 140 mmol/L (ref 135–145)

## 2022-11-26 LAB — GLUCOSE, CAPILLARY
Glucose-Capillary: 193 mg/dL — ABNORMAL HIGH (ref 70–99)
Glucose-Capillary: 282 mg/dL — ABNORMAL HIGH (ref 70–99)
Glucose-Capillary: 361 mg/dL — ABNORMAL HIGH (ref 70–99)
Glucose-Capillary: 424 mg/dL — ABNORMAL HIGH (ref 70–99)

## 2022-11-26 MED ORDER — DEXTROSE 50 % IV SOLN: 1.0000 | Freq: Once | INTRAVENOUS | Status: DC

## 2022-11-26 MED ORDER — INSULIN ASPART 100 UNIT/ML IJ SOLN
6.0000 [IU] | Freq: Once | INTRAMUSCULAR | Status: AC
  Administered 2022-11-26: 6 [IU] via SUBCUTANEOUS
  Filled 2022-11-26: qty 1

## 2022-11-26 MED ORDER — INSULIN ASPART 100 UNIT/ML IJ SOLN
4.0000 [IU] | Freq: Three times a day (TID) | INTRAMUSCULAR | Status: DC
  Administered 2022-11-26 – 2022-11-28 (×6): 4 [IU] via SUBCUTANEOUS
  Filled 2022-11-26 (×6): qty 1

## 2022-11-26 MED ORDER — APIXABAN 5 MG PO TABS
5.0000 mg | ORAL_TABLET | Freq: Two times a day (BID) | ORAL | Status: DC
  Administered 2022-11-26 – 2022-11-28 (×4): 5 mg via ORAL
  Filled 2022-11-26 (×4): qty 1

## 2022-11-26 MED ORDER — AMLODIPINE BESYLATE 10 MG PO TABS
10.0000 mg | ORAL_TABLET | Freq: Every day | ORAL | Status: DC
  Administered 2022-11-26 – 2022-11-28 (×3): 10 mg via ORAL
  Filled 2022-11-26 (×3): qty 1

## 2022-11-26 MED ORDER — VANCOMYCIN HCL 1750 MG/350ML IV SOLN
1750.0000 mg | INTRAVENOUS | Status: DC
  Administered 2022-11-26: 1750 mg via INTRAVENOUS
  Filled 2022-11-26 (×2): qty 350

## 2022-11-26 MED ORDER — SODIUM CHLORIDE 0.9 % IV SOLN
2.0000 g | Freq: Three times a day (TID) | INTRAVENOUS | Status: DC
  Administered 2022-11-26 – 2022-11-27 (×4): 2 g via INTRAVENOUS
  Filled 2022-11-26 (×2): qty 2
  Filled 2022-11-26: qty 12.5
  Filled 2022-11-26: qty 2
  Filled 2022-11-26: qty 12.5

## 2022-11-26 MED ORDER — INSULIN GLARGINE-YFGN 100 UNIT/ML ~~LOC~~ SOLN
30.0000 [IU] | Freq: Two times a day (BID) | SUBCUTANEOUS | Status: DC
  Administered 2022-11-26 – 2022-11-27 (×3): 30 [IU] via SUBCUTANEOUS
  Filled 2022-11-26 (×4): qty 0.3

## 2022-11-26 NOTE — Progress Notes (Signed)
       CROSS COVER NOTE  NAME: RAMIL EDGINGTON MRN: [id number] DOB : 09-26-1946    Time of Service   2131 pm  HPI/Events of Note   Nurse reports blood sugar 424  Assessment and  Interventions   Assessment: Current night time sliding scale 0-5 for blood sugars up to 400. Also has 30 unit semglee now Plan: Total dose 6 units of novolog ordered.        Kathlene Cote NP [facility]

## 2022-11-26 NOTE — Progress Notes (Signed)
Daily Progress Note   Subjective  - 1 Day Post-Op  Right third and fourth toe amputations.  He is doing well.  No pain.  Objective Vitals:   11/25/22 1950 11/26/22 0254 11/26/22 0734 11/26/22 0744  BP: (!) 151/76 (!) 133/49 (!) 174/80 (!) 173/82  Pulse: (!) 109 76 81 74  Resp: '17 16 18 18  '$ Temp: 98.2 F (36.8 C) 98.7 F (37.1 C) 98.1 F (36.7 C) 98.1 F (36.7 C)  TempSrc: Oral Oral Oral Oral  SpO2: 97% 97% 96% 95%  Weight:      Height:        Physical Exam: Dressing change.  Skin flaps intact.  Little bit of bluish discoloration as expected as status post amputation.  No signs of active infection.  No lymphangitic streaking.  Laboratory CBC    Component Value Date/Time   WBC 7.9 11/26/2022 0542   HGB 8.8 (L) 11/26/2022 0542   HCT 27.6 (L) 11/26/2022 0542   PLT 258 11/26/2022 0542    BMET    Component Value Date/Time   NA 140 11/26/2022 0542   K 4.4 11/26/2022 0542   CL 115 (H) 11/26/2022 0542   CO2 22 11/26/2022 0542   GLUCOSE 213 (H) 11/26/2022 0542   BUN 16 11/26/2022 0542   CREATININE 1.20 11/26/2022 0542   CALCIUM 8.9 11/26/2022 0542   GFRNONAA >60 11/26/2022 0542   GFRAA >60 03/19/2020 0550    Assessment/Planning: Status post right third and fourth toes amputation for infection  At this point new dressing applied.  He can keep this dressing clean dry and intact until his follow-up visit with me in the outpatient clinic.  Continue to ambulate in the postoperative shoe but try to minimize ambulation is much as he can. Amputation likely remove most of the vast majority of the infection but would recommend 5 days of oral antibiotic broad-spectrum such as Keflex. Follow-up with me in 1 week.  [name]  11/26/2022, 1:10 PM

## 2022-11-26 NOTE — Discharge Instructions (Signed)
Keep dressing clean dry and intact.  Ambulate in postop shoe only.  Try to minimize ambulation is much as possible.  Follow-up with me in 1 week in the outpatient clinic.

## 2022-11-26 NOTE — Evaluation (Signed)
Occupational Therapy Evaluation Patient Details Name: [name] MRN: [id number] DOB: 1946/08/09 Today's Date: 11/26/2022   History of Present Illness [name] is a 76 year old male with history of insulin-dependent diabetes mellitus, depression, peripheral neuropathy, paroxysmal atrial flutter, CAD, hypertension, BPH, who presents emergency department for chief concerns of abnormal labs outpatient S/p partial amputation R fourth and third toe on 11/25/22.   Clinical Impression   [name] was seen for OT evaluation this date. Prior to hospital admission, pt was MOD I using RW. Pt lives alone with family available PRN. Pt presents to acute OT demonstrating impaired ADL performance and functional mobility 2/2 decreased activity tolerance and functional balance deficits. Pt received in bed, pleasant and agreeable to session with good insight into deficits. Verbalized plans for showering seated in walk in shower to minimize falls risk at home. INDEPENDENTLY dons post op shoe and regular L shoe seated edge of bed. SUPERVISION + RW for functional mobility ~100 ft, improved posture with adjusted RW height. SUPERVISION grooming standing sink side, good balance with single UE support on counter. Pt would benefit from skilled OT. Upon hospital discharge, recommend HHOT to maximize pt safety with high level balance IADLs.     Recommendations for follow up therapy are one component of a multi-disciplinary discharge planning process, led by the attending physician.  Recommendations may be updated based on patient status, additional functional criteria and insurance authorization.   Follow Up Recommendations  Home health OT     Assistance Recommended at Discharge Set up Supervision/Assistance  Patient can return home with the following Help with stairs or ramp for entrance;Assistance with cooking/housework    Functional Status Assessment  Patient has had a recent decline in  their functional status and demonstrates the ability to make significant improvements in function in a reasonable and predictable amount of time.  Equipment Recommendations  None recommended by OT    Recommendations for Other Services       Precautions / Restrictions Precautions Required Braces or Orthoses: Other Brace Other Brace: post op shoe Restrictions Weight Bearing Restrictions: Yes RLE Weight Bearing: Weight bearing as tolerated (in post op shoe)      Mobility Bed Mobility Overal bed mobility: Independent                  Transfers Overall transfer level: Modified independent Equipment used: Rolling walker (2 wheels)                      Balance Overall balance assessment: Needs assistance Sitting-balance support: No upper extremity supported, Feet supported Sitting balance-Leahy Scale: Normal     Standing balance support: Single extremity supported, During functional activity Standing balance-Leahy Scale: Good                             ADL either performed or assessed with clinical judgement   ADL Overall ADL's : Needs assistance/impaired                                       General ADL Comments: INDEPENDENTLY dons post op shoe and regular L shoe seated edge of bed. SUPERVISION + RW for functional mobility ~100 ft, improved posture with adjusted RW height. SUPERVISION grooming standing sink side, good balance with single UE support on counter      Pertinent Vitals/Pain Pain Assessment  Pain Assessment: No/denies pain     Hand Dominance     Extremity/Trunk Assessment Upper Extremity Assessment Upper Extremity Assessment: Overall WFL for tasks assessed   Lower Extremity Assessment Lower Extremity Assessment: Overall WFL for tasks assessed       Communication Communication Communication: No difficulties   Cognition Arousal/Alertness: Awake/alert Behavior During Therapy: WFL for tasks  assessed/performed Overall Cognitive Status: Within Functional Limits for tasks assessed                                                  Home Living Family/patient expects to be discharged to:: Private residence Living Arrangements: Alone Available Help at Discharge: Family (son and daughter in law) Type of Home: House Home Access: Level entry     Home Layout: Multi-level;1/2 bath on main level Alternate Level Stairs-Number of Steps: sleeps on main floor only             Home Equipment: Conservation officer, nature (2 wheels);BSC/3in1          Prior Functioning/Environment Prior Level of Function : Independent/Modified Independent             Mobility Comments: use of RW last 3 months          OT Problem List: Decreased strength;Decreased activity tolerance;Impaired balance (sitting and/or standing)      OT Treatment/Interventions: Self-care/ADL training;Therapeutic exercise;Energy conservation;DME and/or AE instruction;Therapeutic activities;Patient/family education;Balance training    OT Goals(Current goals can be found in the care plan section) Acute Rehab OT Goals Patient Stated Goal: to go home OT Goal Formulation: With patient Time For Goal Achievement: 12/10/22 Potential to Achieve Goals: Good ADL Goals Pt Will Perform Grooming: Independently;standing Pt Will Perform Lower Body Dressing: Independently;sit to/from stand Pt Will Transfer to Toilet: Independently;regular height toilet  OT Frequency: Min 2X/week    Co-evaluation              AM-PAC OT "6 Clicks" Daily Activity     Outcome Measure Help from another person eating meals?: None Help from another person taking care of personal grooming?: A Little Help from another person toileting, which includes using toliet, bedpan, or urinal?: A Little Help from another person bathing (including washing, rinsing, drying)?: A Little Help from another person to put on and taking off regular  upper body clothing?: None Help from another person to put on and taking off regular lower body clothing?: None 6 Click Score: 21   End of Session Equipment Utilized During Treatment: Rolling walker (2 wheels) Nurse Communication: Mobility status  Activity Tolerance: Patient tolerated treatment well Patient left: with call bell/phone within reach;in chair  OT Visit Diagnosis: Other abnormalities of gait and mobility (R26.89);Muscle weakness (generalized) (M62.81)                Time: 4818-5631 OT Time Calculation (min): 19 min Charges:  OT General Charges $OT Visit: 1 Visit OT Evaluation $OT Eval Low Complexity: 1 Low OT Treatments $Self Care/Home Management : 8-22 mins  Dessie Coma, M.S. OTR/L  11/26/22, 12:41 PM  ascom [phone]

## 2022-11-26 NOTE — Evaluation (Signed)
Physical Therapy Evaluation Patient Details Name: [name] MRN: [id number] DOB: 1946/04/08 Today's Date: 11/26/2022  History of Present Illness  [name] is a 76 year old male with history of insulin-dependent diabetes mellitus, depression, peripheral neuropathy, paroxysmal atrial flutter, CAD, hypertension, BPH, who presents emergency department for chief concerns of abnormal labs outpatient S/p partial amputation R fourth and third toe on 11/25/22.  Clinical Impression  Pt is a pleasant 76 year old male who was admitted for partial amp of 2nd/3rd toe and currently WBing with post op shoe. Pt performs bed mobility with independence, transfers with mod I, and ambulation with supervision and RW. Does demonstrate wide BOS with education on preventing kicking walker with post op shoe. Long discussion regarding home safety. Pt with great safety awareness and aware of mobility restrictions. He plans to be checked on by local family in addition to [facility] agency. Pt demonstrates deficits with balance. Would benefit from skilled PT to address above deficits and promote optimal return to PLOF. Recommend transition to [place] upon discharge from acute hospitalization.      Recommendations for follow up therapy are one component of a multi-disciplinary discharge planning process, led by the attending physician.  Recommendations may be updated based on patient status, additional functional criteria and insurance authorization.  Follow Up Recommendations Home health PT      Assistance Recommended at Discharge Set up Supervision/Assistance  Patient can return home with the following  A little help with walking and/or transfers;Help with stairs or ramp for entrance    Equipment Recommendations None recommended by PT  Recommendations for Other Services       Functional Status Assessment Patient has had a recent decline in their functional status and demonstrates the ability to make  significant improvements in function in a reasonable and predictable amount of time.     Precautions / Restrictions Precautions Precautions: Fall Required Braces or Orthoses: Other Brace Other Brace: post op shoe Restrictions Weight Bearing Restrictions: Yes RLE Weight Bearing: Weight bearing as tolerated      Mobility  Bed Mobility Overal bed mobility: Independent                  Transfers Overall transfer level: Modified independent Equipment used: Rolling walker (2 wheels)               General transfer comment: safe technique with pushing off of bed. UPright posture for standing    Ambulation/Gait Ambulation/Gait assistance: Supervision Gait Distance (Feet): 40 Feet Assistive device: Rolling walker (2 wheels) Gait Pattern/deviations: Step-to pattern       General Gait Details: ambulated short distances as per MD, limit ambulation during healing process  Stairs            Wheelchair Mobility    Modified Rankin (Stroke Patients Only)       Balance Overall balance assessment: Needs assistance Sitting-balance support: No upper extremity supported, Feet supported Sitting balance-Leahy Scale: Normal     Standing balance support: Single extremity supported, During functional activity Standing balance-Leahy Scale: Good                               Pertinent Vitals/Pain Pain Assessment Pain Assessment: No/denies pain    Home Living Family/patient expects to be discharged to:: Private residence Living Arrangements: Alone Available Help at Discharge: Family;Available PRN/intermittently (son, daughter in law, and grand daughter) Type of Home: House Home Access: Level entry  Alternate Level Stairs-Number of Steps: sleeps on main floor only Home Layout: Multi-level;1/2 bath on main level Home Equipment: Conservation officer, nature (2 wheels);BSC/3in1      Prior Function Prior Level of Function : Independent/Modified Independent              Mobility Comments: use of RW last 3 months       Hand Dominance        Extremity/Trunk Assessment   Upper Extremity Assessment Upper Extremity Assessment: Overall WFL for tasks assessed    Lower Extremity Assessment Lower Extremity Assessment: Overall WFL for tasks assessed       Communication   Communication: No difficulties  Cognition Arousal/Alertness: Awake/alert Behavior During Therapy: WFL for tasks assessed/performed Overall Cognitive Status: Within Functional Limits for tasks assessed                                          General Comments      Exercises     Assessment/Plan    PT Assessment Patient needs continued PT services  PT Problem List Decreased balance;Decreased mobility       PT Treatment Interventions DME instruction;Gait training;Balance training    PT Goals (Current goals can be found in the Care Plan section)  Acute Rehab PT Goals Patient Stated Goal: to go home PT Goal Formulation: With patient Time For Goal Achievement: 12/10/22 Potential to Achieve Goals: Good    Frequency Min 2X/week     Co-evaluation               AM-PAC PT "6 Clicks" Mobility  Outcome Measure Help needed turning from your back to your side while in a flat bed without using bedrails?: None Help needed moving from lying on your back to sitting on the side of a flat bed without using bedrails?: None Help needed moving to and from a bed to a chair (including a wheelchair)?: A Little Help needed standing up from a chair using your arms (e.g., wheelchair or bedside chair)?: A Little Help needed to walk in hospital room?: A Little Help needed climbing 3-5 steps with a railing? : A Little 6 Click Score: 20    End of Session   Activity Tolerance: Patient tolerated treatment well Patient left: in bed (seated at EOB) Nurse Communication: Mobility status PT Visit Diagnosis: Unsteadiness on feet (R26.81);Difficulty in walking,  not elsewhere classified (R26.2)    Time: 4481-8563 PT Time Calculation (min) (ACUTE ONLY): 23 min   Charges:   PT Evaluation $PT Eval Low Complexity: 1 Low PT Treatments $Gait Training: 8-22 mins        Greggory Stallion, PT, DPT, GCS [phone]   Katelynne Revak 11/26/2022, 3:04 PM

## 2022-11-26 NOTE — TOC Progression Note (Signed)
Transition of Care [facility]) - Progression Note    Patient Details  Name: [name] MRN: [id number] Date of Birth: 18-May-1946  Transition of Care [facility]) CM/SW Contact  Beverly Sessions, RN Phone Number: 11/26/2022, 3:13 PM  Clinical Narrative:     Therapy recommending home health Patient in agreement. States he does not have a preference of agency. Referral made to [facility] with [facility], awaiting response if they can accept.   No DME needed for discharge.  Plan for daughter in law to transport at discharge       Expected Discharge Plan and Services                                                 Social Determinants of Health (SDOH) Interventions    Readmission Risk Interventions    11/25/2022   12:26 PM  Readmission Risk Prevention Plan  Transportation Screening Complete  HRI or Home Care Consult Complete  Palliative Care Screening Complete  Medication Review (RN Care Manager) Complete

## 2022-11-26 NOTE — Progress Notes (Signed)
Pharmacy Antibiotic Note  [name] is a 76 y.o. male s/p amputation of third toe on the right in late September admitted on 11/23/2022 with cellulitis.  Pharmacy has been consulted for vancomycin and cefepime dosing. Since admission renal function continues to improve  Plan:  1) adjust vancomycin to 1750 mg IV every 24 hours --Goal AUC 400-550. --Expected AUC: 473.0 --SCr used: 1.20 mg/dL --daily serum creatinine while on IV vancomycin  2) adjust cefepime to 2 grams IV every 8 hours   Height: 6' (182.9 cm) Weight: 100.2 kg (221 lb) IBW/kg (Calculated) : 77.6  Temp (24hrs), Avg:98 F (36.7 C), Min:97.2 F (36.2 C), Max:98.7 F (37.1 C)  Recent Labs  Lab 11/23/22 1615 11/23/22 1815 11/23/22 2210 11/24/22 0812 11/25/22 0848 11/26/22 0542  WBC 9.0  --   --  7.5  --  7.9  CREATININE 1.76*  --   --  1.34*  --  1.20  LATICACIDVEN  --  2.8* 1.8  --   --   --   VANCORANDOM  --   --   --   --  18  --      Estimated Creatinine Clearance: 63.1 mL/min (by C-G formula based on SCr of 1.2 mg/dL).    No Known Allergies  Antimicrobials this admission: 11/27 vancomycin >>  11/27 cefepime >>   Microbiology results: 11/27 BCx: NGTD (pending) 11/29 WCx GPC in pairs (pending) 11/28 MRSA PCR negative  Thank you for allowing pharmacy to be a part of this patient's care.  Dallie Piles 11/26/2022 7:13 AM

## 2022-11-26 NOTE — Progress Notes (Signed)
Progress Note   Patient: [name] UUV:[id number] DOB: [birth year] DOA: 11/23/2022     3 DOS: the patient was seen and examined on 11/26/2022   Brief hospital course: [name] is a 76 year old male with history of insulin-dependent diabetes mellitus, depression, peripheral neuropathy, paroxysmal atrial flutter, CAD, hypertension, BPH, who presents emergency department for chief concerns of abnormal labs outpatient. He reports the 3rd right toe has been bothering him for about two weeks.  He denies known trauma.  No fever or chills.  Initial vitals in the emergency department showed temperature of 98.2, respiration rate of 20, heart rate of 75, blood pressure 119/59, SpO2 of 99% on room air.  Serum sodium is 135, potassium 5.3, chloride of 103, bicarb 21, BUN of 36, serum creatinine of 1.76, EGFR 39, nonfasting blood glucose 339, anion gap was 11, WBC 9.0, chloride of 10.1, platelets of 354.  Lactic acid elevated at 2.8.  Lipase was negative.  Magnesium level was 2.7.  Right foot x-ray was read as: Peripheral gas collection at the third digit could be due to bandage material or potential soft tissue ulcer.  ED treatment: Cefepime, vancomycin, sodium chloride 1 L bolus.  Podiatry was consulted.  11/28: Elevated blood pressure.  Pain can be contributory.  Pertinent labs for decrease in hemoglobin to 8.6 from 10.1 on admission, all cell lines decreased so some dilutional effect with IV fluid.  Renal functions seems improving, still little above baseline of 1.1-1.2.  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  Podiatry saw him and planning to take him to the OR tomorrow for third and fourth toe amputation.  We will continue with antibiotics per podiatry recommendations.  11/29: Blood pressure remained elevated.  Adding losartan 50 mg daily. Going to the OR later today for third and fourth toe amputation.  11/30: Patient underwent amputation of third and partial amputation of fourth  toe with podiatry on 11/25/2022.  Preliminary cultures with gram-positive cocci in pairs. Labs seems stable.  CBG remained elevated-changing Semglee to 30 units twice daily and added 4 units with meal. Blood pressure remained elevated-adding amlodipine 10 mg. PT and OT are recommending home health services. Sent message to surgeon regarding restarting home Eliquis  Assessment and Plan: * Osteomyelitis ([place]) Right third toe With elevated lactic acid - Status post amputation via Dr. Vickki Muff about 2 months ago and is positive for purulent discharge S/p another amputation involving third toe and partial fourth toe on 11/25/2022 Preliminary cultures growing gram-positive cocci in pair. - Continue with cefepime and vancomycin -Follow-up final culture results-podiatry is recommending continuation of antibiotics for 5 more days on discharge.  AKI (acute kidney injury) ([place]) History of CKD stage IIIa. Creatinine seems improving and now close to baseline of 1.1-1.2. -Monitor renal function -Avoid nephrotoxins  HTN (hypertension) Blood pressure remained elevated. - Resumed home metoprolol tartrate 12.5 mg p.o. twice daily for 11/24/2022 - Hydralazine 25 mg p.o. every 6 hours as needed for SBP greater than 160,  -Continue losartan 50 mg daily -Add amlodipine 10 mg daily  Type II diabetes mellitus with renal manifestations (HCC)  CBG again elevated.  As Semglee was decreased after having 1 episode of hypoglycemia..  -Change Semglee to 30 units twice daily -Add 4 units of NovoLog with meals. -Continue with SSI  Diabetic neuropathy (HCC) - Gabapentin 100 mg p.o. 3 times daily resumed  Atrial flutter, paroxysmal ([place]) Home Eliquis is being held for surgery tomorrow. -Continue with metoprolol  CAD (coronary artery disease) S/p CABG. -  Holding home Plavix for surgery tomorrow -Continue with metoprolol and statin  HLD (hyperlipidemia) - Simvastatin 40 mg nightly resumed  BPH (benign  prostatic hyperplasia) -Continue home finasteride  Depression - Resumed home paroxetine 60 mg daily, nortriptyline 30 mg nightly  Restless leg syndrome - Ropinirole 0.5 mg nightly resumed  Class 2 severe obesity due to excess calories with serious comorbidity and body mass index (BMI) of 35.0 to 35.9 in adult [facility]) Estimated body mass index is 30.65 kg/m as calculated from the following:   Height as of this encounter: 6' (1.829 m).   Weight as of this encounter: 102.5 kg.   Subjective: Patient was seen and examined today.  Denies any pain.  He would like to go home with home health services.  Physical Exam: Vitals:   11/25/22 1950 11/26/22 0254 11/26/22 0734 11/26/22 0744  BP: (!) 151/76 (!) 133/49 (!) 174/80 (!) 173/82  Pulse: (!) 109 76 81 74  Resp: _0 Temp: 98.2 F (36.8 C) 98.7 F (37.1 C) 98.1 F (36.7 C) 98.1 F (36.7 C)  TempSrc: Oral Oral Oral Oral  SpO2: 97% 97% 96% 95%  Weight:      Height:       General.  Well-developed gentleman, in no acute distress. Pulmonary.  Lungs clear bilaterally, normal respiratory effort. CV.  Regular rate and rhythm, no JVD, rub or murmur. Abdomen.  Soft, nontender, nondistended, BS positive. CNS.  Alert and oriented .  No focal neurologic deficit. Extremities.  No edema, no cyanosis, pulses intact and symmetrical.  Right foot with bandage and Ace wrap. Psychiatry.  Judgment and insight appears normal.   Data Reviewed: Prior data reviewed  Family Communication: Talked with daughter on phone.  Disposition: Status is: Inpatient Remains inpatient appropriate because: Severity of illness  Planned Discharge Destination: Home with Home Health  Time spent: 50 minutes  This record has been created using Systems analyst. Errors have been sought and corrected,but may not always be located. Such creation errors do not reflect on the standard of care.   Author: Lorella Nimrod, MD 11/26/2022 2:02 PM  For on  call review [email or web address].

## 2022-11-26 NOTE — Care Management Important Message (Signed)
Important Message  Patient Details  Name: [name] MRN: [id number] Date of Birth: 1946-06-30   Medicare Important Message Given:  Yes     Dannette Barbara 11/26/2022, 5:30 PM

## 2022-11-26 NOTE — Assessment & Plan Note (Signed)
History of CKD stage IIIa. Creatinine seems improving and now close to baseline of 1.1-1.2. -Monitor renal function -Avoid nephrotoxins

## 2022-11-26 NOTE — Assessment & Plan Note (Signed)
CBG again elevated.  As Semglee was decreased after having 1 episode of hypoglycemia..  -Change Semglee to 30 units twice daily -Add 4 units of NovoLog with meals. -Continue with SSI

## 2022-11-27 LAB — GLUCOSE, CAPILLARY
Glucose-Capillary: 149 mg/dL — ABNORMAL HIGH (ref 70–99)
Glucose-Capillary: 211 mg/dL — ABNORMAL HIGH (ref 70–99)
Glucose-Capillary: 207 mg/dL — ABNORMAL HIGH (ref 70–99)
Glucose-Capillary: 188 mg/dL — ABNORMAL HIGH (ref 70–99)

## 2022-11-27 LAB — CREATININE, SERUM
Creatinine, Ser: 1.19 mg/dL (ref 0.61–1.24)
GFR, Estimated: >60 mL/min (ref >60)

## 2022-11-27 LAB — SURGICAL PATHOLOGY

## 2022-11-27 MED ORDER — POLYETHYLENE GLYCOL 3350 17 G PO PACK
17.0000 g | PACK | Freq: Every day | ORAL | Status: DC | PRN
  Administered 2022-11-27: 17 g via ORAL
  Filled 2022-11-27: qty 1

## 2022-11-27 MED ORDER — INSULIN GLARGINE-YFGN 100 UNIT/ML ~~LOC~~ SOLN
35.0000 [IU] | Freq: Two times a day (BID) | SUBCUTANEOUS | Status: DC
  Administered 2022-11-27 – 2022-11-28 (×2): 35 [IU] via SUBCUTANEOUS
  Filled 2022-11-27 (×3): qty 0.35

## 2022-11-27 MED ORDER — AMOXICILLIN-POT CLAVULANATE 875-125 MG PO TABS
1.0000 | ORAL_TABLET | Freq: Two times a day (BID) | ORAL | Status: DC
  Administered 2022-11-27 – 2022-11-28 (×3): 1 via ORAL
  Filled 2022-11-27 (×3): qty 1

## 2022-11-27 NOTE — TOC Progression Note (Addendum)
Transition of Care [facility]) - Progression Note    Patient Details  Name: LADARREN STEINER MRN: [id number] Date of Birth: [birth year]  Transition of Care [facility]) CM/SW [place], LCSW Phone Number: 11/27/2022, 8:27 AM  Clinical Narrative:    CSW reached out to [facility] with [facility] Tommi Rumps confirmed that [facility] can accept patient for Home Health services.  3:32- PT rec RW. Order made to [facility] with [facility], notified Cyril Mourning of plan for DC tomorrow per MD.  4:28- Per Cyril Mourning with [employer] patient declined RW.        Expected Discharge Plan and Services                                                 Social Determinants of Health (SDOH) Interventions    Readmission Risk Interventions    11/25/2022   12:26 PM  Readmission Risk Prevention Plan  Transportation Screening Complete  HRI or Home Care Consult Complete  Palliative Care Screening Complete  Medication Review (RN Care Manager) Complete

## 2022-11-27 NOTE — Assessment & Plan Note (Signed)
CBG again elevated.   -Change Semglee to 35 units twice daily -Add 4 units of NovoLog with meals. -Continue with SSI

## 2022-11-27 NOTE — Assessment & Plan Note (Signed)
Blood pressure remained elevated. - Resumed home metoprolol tartrate 12.5 mg p.o. twice daily for 11/24/2022 - Hydralazine 25 mg p.o. every 6 hours as needed for SBP greater than 160,  -Continue losartan 50 mg daily -Add amlodipine 10 mg daily

## 2022-11-27 NOTE — Progress Notes (Signed)
Physical Therapy Treatment Patient Details Name: [name] MRN: [id number] DOB: 1946/11/12 Today's Date: 11/27/2022   History of Present Illness [name] is a 76 year old male with history of insulin-dependent diabetes mellitus, depression, peripheral neuropathy, paroxysmal atrial flutter, CAD, hypertension, BPH, who presents emergency department for chief concerns of abnormal labs outpatient S/p partial amputation R fourth and third toe on 11/25/22.    PT Comments    Pt resting in bed upon PT arrival; agreeable to therapy.  During session pt independent with bed mobility; modified independent with transfers; and SBA ambulating 30 feet with RW use.  Pt with improved BOS (able to safely keep B LE's within walker and did not bump walker with post op shoe during session's activities).  Will continue to focus on strengthening, balance, and limited mobility (minimize ambulation per MD) during hospitalization.    Recommendations for follow up therapy are one component of a multi-disciplinary discharge planning process, led by the attending physician.  Recommendations may be updated based on patient status, additional functional criteria and insurance authorization.  Follow Up Recommendations  Home health PT     Assistance Recommended at Discharge Set up Supervision/Assistance  Patient can return home with the following A little help with walking and/or transfers;Help with stairs or ramp for entrance   Equipment Recommendations  Rolling walker (2 wheels)    Recommendations for Other Services       Precautions / Restrictions Precautions Precautions: Fall Required Braces or Orthoses: Other Brace Other Brace: post op shoe Restrictions Weight Bearing Restrictions: Yes RLE Weight Bearing: Weight bearing as tolerated     Mobility  Bed Mobility Overal bed mobility: Independent             General bed mobility comments: Semi-supine to/from sitting without any  noted difficulties.    Transfers Overall transfer level: Modified independent Equipment used: Rolling walker (2 wheels)               General transfer comment: steady transfers with RW use    Ambulation/Gait Ambulation/Gait assistance: Supervision Gait Distance (Feet): 30 Feet Assistive device: Rolling walker (2 wheels) Gait Pattern/deviations: Step-to pattern Gait velocity: decreased     General Gait Details: vc's for step to gait pattern; limited/short ambulation distance per MD (limit ambulation during healing process)   Stairs             Wheelchair Mobility    Modified Rankin (Stroke Patients Only)       Balance Overall balance assessment: Needs assistance Sitting-balance support: No upper extremity supported, Feet supported Sitting balance-Leahy Scale: Normal Sitting balance - Comments: steady sitting putting on shoes   Standing balance support: Bilateral upper extremity supported, During functional activity, Reliant on assistive device for balance Standing balance-Leahy Scale: Good Standing balance comment: steady ambulating with RW use                            Cognition Arousal/Alertness: Awake/alert Behavior During Therapy: WFL for tasks assessed/performed Overall Cognitive Status: Within Functional Limits for tasks assessed                                          Exercises Total Joint Exercises Long Arc Quad: AROM, Strengthening, Both, 10 reps, Seated General Exercises - Lower Extremity Hip Flexion/Marching: AROM, Strengthening, Both, 10 reps, Seated    General Comments  Nursing cleared pt for participation in physical therapy.  Pt agreeable to PT session.      Pertinent Vitals/Pain Pain Assessment Pain Assessment: No/denies pain Vitals (HR and O2 on room air) stable and WFL throughout treatment session.    Home Living                          Prior Function            PT Goals  (current goals can now be found in the care plan section) Acute Rehab PT Goals Patient Stated Goal: to go home PT Goal Formulation: With patient Time For Goal Achievement: 12/10/22 Potential to Achieve Goals: Good Progress towards PT goals: Progressing toward goals    Frequency    Min 2X/week      PT Plan Current plan remains appropriate    Co-evaluation              AM-PAC PT "6 Clicks" Mobility   Outcome Measure  Help needed turning from your back to your side while in a flat bed without using bedrails?: None Help needed moving from lying on your back to sitting on the side of a flat bed without using bedrails?: None Help needed moving to and from a bed to a chair (including a wheelchair)?: A Little Help needed standing up from a chair using your arms (e.g., wheelchair or bedside chair)?: A Little Help needed to walk in hospital room?: A Little Help needed climbing 3-5 steps with a railing? : A Little 6 Click Score: 20    End of Session Equipment Utilized During Treatment: Gait belt Activity Tolerance: Patient tolerated treatment well Patient left: in bed;with call bell/phone within reach (R LE elevated on 2 pillows) Nurse Communication: Mobility status;Precautions PT Visit Diagnosis: Unsteadiness on feet (R26.81);Difficulty in walking, not elsewhere classified (R26.2)     Time: 0347-4259 PT Time Calculation (min) (ACUTE ONLY): 18 min  Charges:  $Therapeutic Activity: 8-22 mins                     Leitha Bleak, PT 11/27/22, 12:43 PM

## 2022-11-27 NOTE — Assessment & Plan Note (Signed)
Right third toe With elevated lactic acid - Status post amputation via Dr. Vickki Muff about 2 months ago and is positive for purulent discharge S/p another amputation involving third toe and partial fourth toe on 11/25/2022 Preliminary cultures growing gram-positive cocci in pair, diagnosed as Enterococcus faecalis. - Antibiotics switched with Augmentin to complete a total of 7-day course

## 2022-11-27 NOTE — Assessment & Plan Note (Signed)
History of CKD stage IIIa. Creatinine seems improving and now close to baseline of 1.1-1.2. -Monitor renal function -Avoid nephrotoxins

## 2022-11-27 NOTE — Progress Notes (Addendum)
Progress Note   Patient: [name] FYB:[id number] DOB: 07/05/1946 DOA: 11/23/2022     4 DOS: the patient was seen and examined on 11/27/2022   Brief hospital course: Mr. Rohith Fauth is a 76 year old male with history of insulin-dependent diabetes mellitus, depression, peripheral neuropathy, paroxysmal atrial flutter, CAD, hypertension, BPH, who presents emergency department for chief concerns of abnormal labs outpatient. He reports the 3rd right toe has been bothering him for about two weeks.  He denies known trauma.  No fever or chills.  Initial vitals in the emergency department showed temperature of 98.2, respiration rate of 20, heart rate of 75, blood pressure 119/59, SpO2 of 99% on room air.  Serum sodium is 135, potassium 5.3, chloride of 103, bicarb 21, BUN of 36, serum creatinine of 1.76, EGFR 39, nonfasting blood glucose 339, anion gap was 11, WBC 9.0, chloride of 10.1, platelets of 354.  Lactic acid elevated at 2.8.  Lipase was negative.  Magnesium level was 2.7.  Right foot x-ray was read as: Peripheral gas collection at the third digit could be due to bandage material or potential soft tissue ulcer.  ED treatment: Cefepime, vancomycin, sodium chloride 1 L bolus.  Podiatry was consulted.  11/28: Elevated blood pressure.  Pain can be contributory.  Pertinent labs for decrease in hemoglobin to 8.6 from 10.1 on admission, all cell lines decreased so some dilutional effect with IV fluid.  Renal functions seems improving, still little above baseline of 1.1-1.2.  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  Podiatry saw him and planning to take him to the OR tomorrow for third and fourth toe amputation.  We will continue with antibiotics per podiatry recommendations.  11/29: Blood pressure remained elevated.  Adding losartan 50 mg daily. Going to the OR later today for third and fourth toe amputation.  11/30: Patient underwent amputation of third and partial amputation of fourth  toe with podiatry on 11/25/2022.  Preliminary cultures with gram-positive cocci in pairs. Labs seems stable.  CBG remained elevated-changing Semglee to 30 units twice daily and added 4 units with meal. Blood pressure remained elevated-adding amlodipine 10 mg. PT and OT are recommending home health services. Sent message to surgeon regarding restarting home Eliquis.  12/1: Patient remained hemodynamically stable.  Eliquis was restarted last night.  Postsurgical bone culture with gram-positive cocci in pairs and Enterococcus faecalis  Assessment and Plan: * Osteomyelitis (HCC) Right third toe With elevated lactic acid - Status post amputation via Dr. Vickki Muff about 2 months ago and is positive for purulent discharge S/p another amputation involving third toe and partial fourth toe on 11/25/2022 Preliminary cultures growing gram-positive cocci in pair, identified as Enterococcus faecalis. -Switch antibiotics with Augmentin to complete a 7-day course.  AKI (acute kidney injury) ([place]) History of CKD stage IIIa. Creatinine seems improving and now close to baseline of 1.1-1.2. -Monitor renal function -Avoid nephrotoxins  HTN (hypertension) Blood pressure remained elevated. - Resumed home metoprolol tartrate 12.5 mg p.o. twice daily for 11/24/2022 - Hydralazine 25 mg p.o. every 6 hours as needed for SBP greater than 160,  -Continue losartan 50 mg daily -Add amlodipine 10 mg daily  Type II diabetes mellitus with renal manifestations (HCC)  CBG again elevated.  .  -Change Semglee to 35 units twice daily -Add 4 units of NovoLog with meals. -Continue with SSI  Diabetic neuropathy (HCC) - Gabapentin 100 mg p.o. 3 times daily resumed  Atrial flutter, paroxysmal (HCC) -Continue Eliquis -Continue with metoprolol  CAD (coronary artery disease) S/p  CABG. -Holding home Plavix for surgery tomorrow -Continue with metoprolol and statin  HLD (hyperlipidemia) - Simvastatin 40 mg nightly  resumed  BPH (benign prostatic hyperplasia) -Continue home finasteride  Depression - Resumed home paroxetine 60 mg daily, nortriptyline 30 mg nightly  Restless leg syndrome - Ropinirole 0.5 mg nightly resumed  Class 2 severe obesity due to excess calories with serious comorbidity and body mass index (BMI) of 35.0 to 35.9 in adult [facility]) Estimated body mass index is 30.65 kg/m as calculated from the following:   Height as of this encounter: 6' (1.829 m).   Weight as of this encounter: 102.5 kg.   Subjective: Patient was seen and examined today.  Pain seems well-controlled.  Did not had any BM yet.  Eating and drinking okay.  Would like to spend another night as he will have some extra help at home.  Today he will be alone.  Physical Exam: Vitals:   11/26/22 0744 11/26/22 1928 11/27/22 0347 11/27/22 0820  BP: (!) 173/82 (!) 113/54 (!) 116/49 (!) 164/69  Pulse: 74 85 74 74  Resp: _0 Temp: 98.1 F (36.7 C) 99.4 F (37.4 C) 98 F (36.7 C) 97.6 F (36.4 C)  TempSrc: Oral Oral Oral Oral  SpO2: 95% 99% 96% 98%  Weight:      Height:       General.  Obese gentleman, in no acute distress. Pulmonary.  Lungs clear bilaterally, normal respiratory effort. CV.  Regular rate and rhythm, no JVD, rub or murmur. Abdomen.  Soft, nontender, nondistended, BS positive. CNS.  Alert and oriented .  No focal neurologic deficit. Extremities.  No edema, no cyanosis, pulses intact and symmetrical, right foot with Ace wrap Psychiatry.  Judgment and insight appears normal.   Data Reviewed: Prior data reviewed  Family Communication: Talked with daughter on phone.  Disposition: Status is: Inpatient Remains inpatient appropriate because: Severity of illness  Planned Discharge Destination: Home with Home Health  Time spent: 45 minutes  This record has been created using Systems analyst. Errors have been sought and corrected,but may not always be located. Such creation  errors do not reflect on the standard of care.   Author: Lorella Nimrod, MD 11/27/2022 3:26 PM  For on call review [email or web address].

## 2022-11-28 LAB — CULTURE, BLOOD (ROUTINE X 2)
Culture: NO GROWTH
Culture: NO GROWTH
Special Requests: ADEQUATE

## 2022-11-28 LAB — GLUCOSE, CAPILLARY
Glucose-Capillary: 154 mg/dL — ABNORMAL HIGH (ref 70–99)
Glucose-Capillary: 175 mg/dL — ABNORMAL HIGH (ref 70–99)

## 2022-11-28 MED ORDER — POLYETHYLENE GLYCOL 3350 17 G PO PACK: 17.0000 g | PACK | Freq: Every day | ORAL | 0 refills | Status: DC | PRN

## 2022-11-28 MED ORDER — LOSARTAN POTASSIUM 50 MG PO TABS: 50.0000 mg | ORAL_TABLET | Freq: Every day | ORAL | 1 refills | Status: DC

## 2022-11-28 MED ORDER — AMOXICILLIN-POT CLAVULANATE 875-125 MG PO TABS: 1.0000 | ORAL_TABLET | Freq: Two times a day (BID) | ORAL | 0 refills | Status: AC

## 2022-11-28 MED ORDER — AMLODIPINE BESYLATE 10 MG PO TABS: 10.0000 mg | ORAL_TABLET | Freq: Every day | ORAL | 1 refills | Status: DC

## 2022-11-28 MED ORDER — ACETAMINOPHEN 325 MG PO TABS: 650.0000 mg | ORAL_TABLET | Freq: Four times a day (QID) | ORAL | 0 refills | Status: DC | PRN

## 2022-11-28 NOTE — TOC Transition Note (Signed)
Transition of Care [facility]) - CM/SW Discharge Note   Patient Details  Name: [name] MRN: [id number] Date of Birth: Nov 09, 1946  Transition of Care [facility]) CM/SW Contact:  Magnus Ivan, LCSW Phone Number: 11/28/2022, 11:22 AM   Clinical Narrative:    Patient has orders to DC home today. CSW notified [facility] with [facility] of DC.      Final next level of care: [place] Barriers to Discharge: Barriers Resolved   Patient Goals and CMS Choice Patient states their goals for this hospitalization and ongoing recovery are:: home with home health CMS Medicare.gov Compare Post Acute Care list provided to:: Patient Choice offered to / list presented to : Patient  Discharge Placement                       Discharge Plan and Services                          HH Arranged: PT, OT, Nurse's Aide [facility] Agency: [place] Date [facility] Agency Contacted: 11/28/22   Representative spoke with at [place]: [place] ([place]) Interventions     Readmission Risk Interventions    11/25/2022   12:26 PM  Readmission Risk Prevention Plan  Transportation Screening Complete  HRI or Home Care Consult Complete  Palliative Care Screening Complete  Medication Review (RN Care Manager) Complete

## 2022-11-28 NOTE — Discharge Summary (Signed)
Physician Discharge Summary   Patient: [name] MRN: [id number] DOB: [birth year]  Admit date:     11/23/2022  Discharge date: 11/28/22  Discharge Physician: Lorella Nimrod   PCP: [name], MD   Recommendations at discharge:  Please obtain CBC and BMP within a week Follow-up with podiatry within a week Follow-up with primary care provider  Discharge Diagnoses: Principal Problem:   Osteomyelitis [facility]) Active Problems:   AKI (acute kidney injury) ([place])   HTN (hypertension)   Type II diabetes mellitus with renal manifestations ([place])   Diabetic neuropathy ([place])   Atrial flutter, paroxysmal (HCC)   CAD (coronary artery disease)   HLD (hyperlipidemia)   BPH (benign prostatic hyperplasia)   Depression   Restless leg syndrome   Class 2 severe obesity due to excess calories with serious comorbidity and body mass index (BMI) of 35.0 to 35.9 in adult [facility])   Hospital Course: [name] is a 76 year old male with history of insulin-dependent diabetes mellitus, depression, peripheral neuropathy, paroxysmal atrial flutter, CAD, hypertension, BPH, who presents emergency department for chief concerns of abnormal labs outpatient. He reports the 3rd right toe has been bothering him for about two weeks.  He denies known trauma.  No fever or chills.  Initial vitals in the emergency department showed temperature of 98.2, respiration rate of 20, heart rate of 75, blood pressure 119/59, SpO2 of 99% on room air.  Serum sodium is 135, potassium 5.3, chloride of 103, bicarb 21, BUN of 36, serum creatinine of 1.76, EGFR 39, nonfasting blood glucose 339, anion gap was 11, WBC 9.0, chloride of 10.1, platelets of 354.  Lactic acid elevated at 2.8.  Lipase was negative.  Magnesium level was 2.7.  Right foot x-ray was read as: Peripheral gas collection at the third digit could be due to bandage material or potential soft tissue ulcer.  ED treatment: Cefepime, vancomycin,  sodium chloride 1 L bolus.  Podiatry was consulted.  11/28: Elevated blood pressure.  Pain can be contributory.  Pertinent labs for decrease in hemoglobin to 8.6 from 10.1 on admission, all cell lines decreased so some dilutional effect with IV fluid.  Renal functions seems improving, still little above baseline of 1.1-1.2.  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  Podiatry saw him and planning to take him to the OR tomorrow for third and fourth toe amputation.  We will continue with antibiotics per podiatry recommendations.  11/29: Blood pressure remained elevated.  Adding losartan 50 mg daily. Going to the OR later today for third and fourth toe amputation.  11/30: Patient underwent amputation of third and partial amputation of fourth toe with podiatry on 11/25/2022.  Preliminary cultures with gram-positive cocci in pairs. Labs seems stable.  CBG remained elevated-changing Semglee to 30 units twice daily and added 4 units with meal. Blood pressure remained elevated-adding amlodipine 10 mg. PT and OT are recommending home health services. Sent message to surgeon regarding restarting home Eliquis.  12/1: Patient remained hemodynamically stable.  Eliquis was restarted last night.  Postsurgical bone culture with gram-positive cocci in pairs and Enterococcus faecalis Antibiotics switched to Augmentin.  Patient would like to spend another night as he will get some help at home tomorrow.  Home health services ordered.  12/2: Patient remained stable.  He is being discharged on 5 more days of Augmentin. Need to have a close follow-up with his podiatrist for further recommendations.  He will continue on current medications and follow-up with his providers for further management.  Assessment and Plan: * Osteomyelitis ([place]) Right third toe With elevated lactic acid - Status post amputation via Dr. Vickki Muff about 2 months ago and is positive for purulent discharge S/p another amputation involving third toe and  partial fourth toe on 11/25/2022 Preliminary cultures growing  Enterococcus faecalis. - Antibiotics switched with Augmentin to complete a total of 7-day course   AKI (acute kidney injury) ([place]) History of CKD stage IIIa. Creatinine seems improving and now close to baseline of 1.1-1.2. -Monitor renal function -Avoid nephrotoxins  HTN (hypertension) Blood pressure mildly elevated. - Resumed home metoprolol tartrate 12.5 mg p.o. twice daily for 11/24/2022 - Hydralazine 25 mg p.o. every 6 hours as needed for SBP greater than 160,  -Continue losartan 50 mg daily -Add amlodipine 10 mg daily  Type II diabetes mellitus with renal manifestations (HCC)  CBG again elevated.   -Change Semglee to 35 units twice daily -Add 4 units of NovoLog with meals. -Continue with SSI  Diabetic neuropathy (HCC) - Gabapentin 100 mg p.o. 3 times daily resumed  Atrial flutter, paroxysmal (HCC) Home Eliquis is being held for surgery tomorrow. -Continue with metoprolol  CAD (coronary artery disease) S/p CABG. -Continue with metoprolol and statin  HLD (hyperlipidemia) - Simvastatin 40 mg nightly resumed  BPH (benign prostatic hyperplasia) -Continue home finasteride  Depression - Resumed home paroxetine 60 mg daily, nortriptyline 30 mg nightly  Restless leg syndrome - Ropinirole 0.5 mg nightly resumed  Class 2 severe obesity due to excess calories with serious comorbidity and body mass index (BMI) of 35.0 to 35.9 in adult [facility]) Estimated body mass index is 30.65 kg/m as calculated from the following:   Height as of this encounter: 6' (1.829 m).   Weight as of this encounter: 102.5 kg.    Pain control - [employer] Controlled Substance Reporting System database was reviewed. and patient was instructed, not to drive, operate heavy machinery, perform activities at heights, swimming or participation in water activities or provide baby-sitting services while on Pain, Sleep and Anxiety Medications;  until their outpatient Physician has advised to do so again. Also recommended to not to take more than prescribed Pain, Sleep and Anxiety Medications.  Consultants: Podiatry Procedures performed: Amputation of right third and fourth toe Disposition: Home health Diet recommendation:  Discharge Diet Orders (From admission, onward)     Start     Ordered   11/28/22 0000  Diet - low sodium heart healthy        11/28/22 1116           Cardiac and Carb modified diet DISCHARGE MEDICATION: Allergies as of 11/28/2022   No Known Allergies      Medication List     STOP taking these medications    Trulicity 4.5 HA/1.9FX Sopn Generic drug: Dulaglutide       TAKE these medications    acetaminophen 325 MG tablet Commonly known as: TYLENOL Take 2 tablets (650 mg total) by mouth every 6 (six) hours as needed for mild pain, fever or headache (or Fever >/= 101).   amLODipine 10 MG tablet Commonly known as: NORVASC Take 1 tablet (10 mg total) by mouth daily.   amoxicillin-clavulanate 875-125 MG tablet Commonly known as: AUGMENTIN Take 1 tablet by mouth every 12 (twelve) hours for 5 days.   apixaban 5 MG Tabs tablet Commonly known as: ELIQUIS Take 1 tablet (5 mg total) by mouth 2 (two) times daily.   calcium carbonate 500 MG chewable tablet Commonly known as: TUMS - dosed in  mg elemental calcium Chew 1 tablet by mouth daily as needed for indigestion or heartburn.   cholecalciferol 1000 units tablet Commonly known as: VITAMIN D Take 1,000 Units by mouth daily.   clopidogrel 75 MG tablet Commonly known as: PLAVIX Take 75 mg by mouth daily.   cyanocobalamin 1000 MCG tablet Commonly known as: VITAMIN B12 Take 1,000 mcg by mouth at bedtime.   docusate sodium 100 MG capsule Commonly known as: COLACE Take 100 mg by mouth 2 (two) times daily.   finasteride 5 MG tablet Commonly known as: PROSCAR Take 5 mg by mouth daily.   furosemide 40 MG tablet Commonly known as:  LASIX Take 20 mg by mouth 2 (two) times daily.   gabapentin 100 MG capsule Commonly known as: NEURONTIN Take 100 mg by mouth 3 (three) times daily.   icosapent Ethyl 1 g capsule Commonly known as: VASCEPA Take 2 g by mouth 2 (two) times daily.   insulin aspart 100 UNIT/ML injection Commonly known as: novoLOG Inject 20 Units into the skin 3 (three) times daily before meals. What changed:  how much to take when to take this additional instructions   insulin glargine 100 UNIT/ML injection Commonly known as: Lantus Inject 0.9 mLs (90 Units total) into the skin at bedtime. Home med. What changed: how much to take   losartan 50 MG tablet Commonly known as: COZAAR Take 1 tablet (50 mg total) by mouth daily.   metFORMIN 1000 MG tablet Commonly known as: GLUCOPHAGE Take 1,000 mg by mouth 2 (two) times daily with a meal.   metoprolol tartrate 25 MG tablet Commonly known as: LOPRESSOR Take 12.5 mg by mouth 2 (two) times daily. What changed: Another medication with the same name was removed. Continue taking this medication, and follow the directions you see here.   nortriptyline 10 MG capsule Commonly known as: PAMELOR Take 30 mg by mouth at bedtime.   PARoxetine 40 MG tablet Commonly known as: PAXIL Take 60 mg by mouth daily.   polyethylene glycol 17 g packet Commonly known as: MIRALAX / GLYCOLAX Take 17 g by mouth daily as needed for mild constipation.   rOPINIRole 0.5 MG tablet Commonly known as: REQUIP Take 0.5 mg by mouth at bedtime.   Semaglutide (2 MG/DOSE) 8 MG/3ML Sopn Inject 2 mg into the skin every 7 (seven) days.   simvastatin 40 MG tablet Commonly known as: ZOCOR Take 20 mg by mouth at bedtime.   tamsulosin 0.4 MG Caps capsule Commonly known as: FLOMAX Take 0.4 mg by mouth daily after lunch.               Durable Medical Equipment  (From admission, onward)           Start     Ordered   11/27/22 1616  For home use only DME Walker rolling   Once       Question Answer Comment  Walker: With [place] Wheels   Patient needs a walker to treat with the following condition Other abnormalities of gait and mobility      11/27/22 1616              Discharge Care Instructions  (From admission, onward)           Start     Ordered   11/28/22 0000  Leave dressing on - Keep it clean, dry, and intact until clinic visit        11/28/22 1116  Follow-up Information     [name], MD. Schedule an appointment as soon as possible for a visit in 1 week(s).   Specialty: Internal Medicine Contact information: [place] [address] [phone]         Samara Deist, DPM. Schedule an appointment as soon as possible for a visit in 1 week(s).   Specialty: Podiatry Contact information: [place] [address] [phone]                Discharge Exam: Filed Weights   11/23/22 1810 11/25/22 1427  Weight: 102.5 kg 100.2 kg   General.     In no acute distress. Pulmonary.  Lungs clear bilaterally, normal respiratory effort. CV.  Regular rate and rhythm, no JVD, rub or murmur. Abdomen.  Soft, nontender, nondistended, BS positive. CNS.  Alert and oriented .  No focal neurologic deficit. Extremities.  No edema, no cyanosis, pulses intact and symmetrical. Psychiatry.  Judgment and insight appears normal.   Condition at discharge: stable  The results of significant diagnostics from this hospitalization (including imaging, microbiology, ancillary and laboratory) are listed below for reference.   Imaging Studies: DG Foot Complete Right  Result Date: 11/23/2022 CLINICAL DATA:  High glucose recent toe removal EXAM: RIGHT FOOT COMPLETE - 3+ VIEW COMPARISON:  06/13/2022 FINDINGS: Patient is status post partial amputation of the first and second digits at the level of the proximal phalanges. Cut margins appear smooth. Mild  degenerative change at the first MTP joint. Peripheral gas collection at the third digit. Limited evaluation of third digit distal phalanx due to flexed positioning. IMPRESSION: 1. Status post partial amputation of the first and second digits at the level of the proximal phalanges. Cut margins appear smooth and no definite acute osseous abnormality at these locations 2. Peripheral gas collection at the third digit could be due to bandage material or potential soft tissue ulcer, correlate with direct inspection. Electronically Signed   By: Donavan Foil M.D.   On: 11/23/2022 21:52    Microbiology: Results for orders placed or performed during the hospital encounter of 11/23/22  Culture, blood (routine x 2)     Status: None   Collection Time: 11/23/22 10:05 PM   Specimen: BLOOD  Result Value Ref Range Status   Specimen Description BLOOD LEFT FOREARM  Final   Special Requests   Final    BOTTLES DRAWN AEROBIC AND ANAEROBIC Blood Culture results may not be optimal due to an excessive volume of blood received in culture bottles   Culture   Final    NO GROWTH 5 DAYS Performed at [facility], [address] [place], [place] [address]    Report Status 11/28/2022 FINAL  Final  Culture, blood (routine x 2)     Status: None   Collection Time: 11/23/22 10:10 PM   Specimen: BLOOD  Result Value Ref Range Status   Specimen Description BLOOD LEFT ASSIST CONTROL  Final   Special Requests   Final    BOTTLES DRAWN AEROBIC AND ANAEROBIC Blood Culture adequate volume   Culture   Final    NO GROWTH 5 DAYS Performed at [facility], [address] [place], [place] [address]    Report Status 11/28/2022 FINAL  Final  Surgical pcr screen     Status: None   Collection Time: 11/24/22  4:03 PM   Specimen: Nasal Mucosa; Nasal Swab  Result Value Ref Range Status   MRSA,  PCR NEGATIVE NEGATIVE Final   Staphylococcus aureus NEGATIVE NEGATIVE Final    Comment: (NOTE) The Xpert SA Assay (FDA  approved for NASAL specimens in patients 103 years of age and older), is one component of a comprehensive surveillance program. It is not intended to diagnose infection nor to guide or monitor treatment. Performed at [facility], [address] [place], [place] [address]   Aerobic/Anaerobic Culture w Gram Stain (surgical/deep wound)     Status: None (Preliminary result)   Collection Time: 11/25/22  3:18 PM   Specimen: PATH Soft tissue  Result Value Ref Range Status   Specimen Description   Final    TOE Performed at [facility], [address], [place], [place] [address]    Special Requests   Final    RIGHT THIRD TOE Performed at [facility], [place], [place] [address]    Gram Stain   Final    RARE WBC PRESENT, PREDOMINANTLY PMN RARE GRAM POSITIVE COCCI IN PAIRS Performed at [facility], [place] [address] [place], [place] [address]    Culture   Final    FEW ENTEROCOCCUS FAECALIS SUSCEPTIBILITIES TO FOLLOW NO ANAEROBES ISOLATED; CULTURE IN PROGRESS FOR 5 DAYS    Report Status PENDING  Incomplete    Labs: CBC: Recent Labs  Lab 11/23/22 1615 11/24/22 0812 11/26/22 0542  WBC 9.0 7.5 7.9  NEUTROABS 6.9  --   --   HGB 10.1* 8.6* 8.8*  HCT 31.7* 26.6* 27.6*  MCV 86.8 86.6 86.0  PLT 354 255 867   Basic Metabolic Panel: Recent Labs  Lab 11/23/22 1615 11/23/22 1815 11/23/22 2210 11/24/22 0812 11/26/22 0542 11/27/22 0540  NA 135  --   --  140 140  --   K 5.3*  --   --  4.0 4.4  --   CL 103  --   --  113* 115*  --   CO2 21*  --   --  23 22  --   GLUCOSE 339*  --   --  79 213*  --   BUN 36*  --   --  28* 16  --   CREATININE 1.76*  --   --  1.34* 1.20 1.19  CALCIUM 9.4  --   --  8.5* 8.9  --   MG  --  2.7*  --   --   --   --   PHOS  --   --  4.2  --   --   --    Liver Function Tests: Recent Labs  Lab 11/23/22 1615  AST 20  ALT 17  ALKPHOS 90  BILITOT 0.7  PROT 7.6  ALBUMIN 3.9   CBG: Recent Labs  Lab  11/27/22 0836 11/27/22 1101 11/27/22 1655 11/27/22 2210 11/28/22 0740  GLUCAP 149* 211* 207* 188* 154*    Discharge time spent: greater than 30 minutes.  This record has been created using Systems analyst. Errors have been sought and corrected,but may not always be located. Such creation errors do not reflect on the standard of care.   Signed: Lorella Nimrod, MD [facility] 11/28/2022

## 2022-11-28 NOTE — Progress Notes (Signed)
Pt discharged home in stable condition. Discharge instructions given. Pt verbalized understanding. Scripts sent to pharmacy of choice. No immediate questions or concerns at this time. Discharged from unit via wheelchair.

## 2022-11-30 LAB — AEROBIC/ANAEROBIC CULTURE W GRAM STAIN (SURGICAL/DEEP WOUND)

## 2022-12-02 ENCOUNTER — Ambulatory Visit: Payer: Self-pay | Admitting: Urology

## 2022-12-08 ENCOUNTER — Encounter: Payer: Self-pay | Admitting: Urology

## 2022-12-17 ENCOUNTER — Emergency Department: Payer: Medicare Other

## 2022-12-17 ENCOUNTER — Observation Stay
Admission: EM | Admit: 2022-12-17 | Discharge: 2022-12-19 | Disposition: A | Discharge status: Home Health | Payer: Medicare Other | Attending: Internal Medicine | Admitting: Internal Medicine

## 2022-12-17 ENCOUNTER — Inpatient Hospital Stay: Payer: Medicare Other

## 2022-12-17 DIAGNOSIS — E1122 Type 2 diabetes mellitus with diabetic chronic kidney disease: Secondary | ICD-10-CM | POA: Insufficient documentation

## 2022-12-17 DIAGNOSIS — N179 Acute kidney failure, unspecified: Secondary | ICD-10-CM | POA: Diagnosis not present

## 2022-12-17 DIAGNOSIS — I130 Hypertensive heart and chronic kidney disease with heart failure and stage 1 through stage 4 chronic kidney disease, or unspecified chronic kidney disease: Secondary | ICD-10-CM | POA: Insufficient documentation

## 2022-12-17 DIAGNOSIS — I5032 Chronic diastolic (congestive) heart failure: Secondary | ICD-10-CM | POA: Insufficient documentation

## 2022-12-17 DIAGNOSIS — Z7901 Long term (current) use of anticoagulants: Secondary | ICD-10-CM | POA: Insufficient documentation

## 2022-12-17 DIAGNOSIS — A419 Sepsis, unspecified organism: Secondary | ICD-10-CM | POA: Insufficient documentation

## 2022-12-17 DIAGNOSIS — J189 Pneumonia, unspecified organism: Secondary | ICD-10-CM | POA: Insufficient documentation

## 2022-12-17 DIAGNOSIS — I4891 Unspecified atrial fibrillation: Secondary | ICD-10-CM | POA: Diagnosis not present

## 2022-12-17 DIAGNOSIS — E1110 Type 2 diabetes mellitus with ketoacidosis without coma: Secondary | ICD-10-CM | POA: Diagnosis not present

## 2022-12-17 DIAGNOSIS — E785 Hyperlipidemia, unspecified: Secondary | ICD-10-CM | POA: Diagnosis present

## 2022-12-17 DIAGNOSIS — Z8673 Personal history of transient ischemic attack (TIA), and cerebral infarction without residual deficits: Secondary | ICD-10-CM | POA: Diagnosis not present

## 2022-12-17 DIAGNOSIS — I25119 Atherosclerotic heart disease of native coronary artery with unspecified angina pectoris: Secondary | ICD-10-CM

## 2022-12-17 DIAGNOSIS — N182 Chronic kidney disease, stage 2 (mild): Secondary | ICD-10-CM | POA: Diagnosis not present

## 2022-12-17 DIAGNOSIS — Z87891 Personal history of nicotine dependence: Secondary | ICD-10-CM | POA: Diagnosis not present

## 2022-12-17 DIAGNOSIS — Z79899 Other long term (current) drug therapy: Secondary | ICD-10-CM | POA: Diagnosis not present

## 2022-12-17 DIAGNOSIS — G9340 Encephalopathy, unspecified: Secondary | ICD-10-CM | POA: Diagnosis not present

## 2022-12-17 DIAGNOSIS — Z7984 Long term (current) use of oral hypoglycemic drugs: Secondary | ICD-10-CM | POA: Insufficient documentation

## 2022-12-17 DIAGNOSIS — Z794 Long term (current) use of insulin: Secondary | ICD-10-CM | POA: Diagnosis not present

## 2022-12-17 DIAGNOSIS — R4182 Altered mental status, unspecified: Secondary | ICD-10-CM | POA: Diagnosis present

## 2022-12-17 DIAGNOSIS — Z7985 Long-term (current) use of injectable non-insulin antidiabetic drugs: Secondary | ICD-10-CM | POA: Insufficient documentation

## 2022-12-17 DIAGNOSIS — Z951 Presence of aortocoronary bypass graft: Secondary | ICD-10-CM | POA: Insufficient documentation

## 2022-12-17 DIAGNOSIS — I4892 Unspecified atrial flutter: Secondary | ICD-10-CM | POA: Diagnosis present

## 2022-12-17 DIAGNOSIS — I2510 Atherosclerotic heart disease of native coronary artery without angina pectoris: Secondary | ICD-10-CM | POA: Diagnosis not present

## 2022-12-17 DIAGNOSIS — E1129 Type 2 diabetes mellitus with other diabetic kidney complication: Secondary | ICD-10-CM | POA: Diagnosis present

## 2022-12-17 DIAGNOSIS — E663 Overweight: Secondary | ICD-10-CM | POA: Diagnosis present

## 2022-12-17 DIAGNOSIS — E1165 Type 2 diabetes mellitus with hyperglycemia: Secondary | ICD-10-CM

## 2022-12-17 LAB — CBC WITH DIFFERENTIAL/PLATELET
Abs Immature Granulocytes: 0.06 10*3/uL (ref 0.00–0.07)
Basophils Absolute: 0 10*3/uL (ref 0.0–0.1)
Basophils Relative: 0 %
Eosinophils Absolute: 0 10*3/uL (ref 0.0–0.5)
Eosinophils Relative: 0 %
HCT: 37.2 % — ABNORMAL LOW (ref 39.0–52.0)
Hemoglobin: 11.6 g/dL — ABNORMAL LOW (ref 13.0–17.0)
Immature Granulocytes: 1 %
Lymphocytes Relative: 10 %
Lymphs Abs: 1 10*3/uL (ref 0.7–4.0)
MCH: 26.4 pg (ref 26.0–34.0)
MCHC: 31.2 g/dL (ref 30.0–36.0)
MCV: 84.5 fL (ref 80.0–100.0)
Monocytes Absolute: 0.8 10*3/uL (ref 0.1–1.0)
Monocytes Relative: 7 %
Neutro Abs: 8.8 10*3/uL — ABNORMAL HIGH (ref 1.7–7.7)
Neutrophils Relative %: 82 %
Platelets: 268 10*3/uL (ref 150–400)
RBC: 4.4 MIL/uL (ref 4.22–5.81)
RDW: 15 % (ref 11.5–15.5)
WBC: 10.7 10*3/uL — ABNORMAL HIGH (ref 4.0–10.5)
nRBC: 0 % (ref 0.0–0.2)

## 2022-12-17 LAB — BASIC METABOLIC PANEL
Anion gap: 17 — ABNORMAL HIGH (ref 5–15)
Anion gap: 14 (ref 5–15)
Anion gap: 10 (ref 5–15)
BUN: 45 mg/dL — ABNORMAL HIGH (ref 8–23)
BUN: 40 mg/dL — ABNORMAL HIGH (ref 8–23)
BUN: 37 mg/dL — ABNORMAL HIGH (ref 8–23)
CO2: 17 mmol/L — ABNORMAL LOW (ref 22–32)
CO2: 17 mmol/L — ABNORMAL LOW (ref 22–32)
CO2: 20 mmol/L — ABNORMAL LOW (ref 22–32)
Calcium: 8.4 mg/dL — ABNORMAL LOW (ref 8.9–10.3)
Calcium: 8.6 mg/dL — ABNORMAL LOW (ref 8.9–10.3)
Calcium: 8.5 mg/dL — ABNORMAL LOW (ref 8.9–10.3)
Chloride: 96 mmol/L — ABNORMAL LOW (ref 98–111)
Chloride: 104 mmol/L (ref 98–111)
Chloride: 106 mmol/L (ref 98–111)
Creatinine, Ser: 2.02 mg/dL — ABNORMAL HIGH (ref 0.61–1.24)
Creatinine, Ser: 1.72 mg/dL — ABNORMAL HIGH (ref 0.61–1.24)
Creatinine, Ser: 1.64 mg/dL — ABNORMAL HIGH (ref 0.61–1.24)
GFR, Estimated: 33 mL/min — ABNORMAL LOW (ref >60)
GFR, Estimated: 40 mL/min — ABNORMAL LOW (ref >60)
GFR, Estimated: 43 mL/min — ABNORMAL LOW (ref >60)
Glucose, Bld: 636 mg/dL (ref 70–99)
Glucose, Bld: 283 mg/dL — ABNORMAL HIGH (ref 70–99)
Glucose, Bld: 157 mg/dL — ABNORMAL HIGH (ref 70–99)
Potassium: 4.7 mmol/L (ref 3.5–5.1)
Potassium: 4 mmol/L (ref 3.5–5.1)
Potassium: 3.6 mmol/L (ref 3.5–5.1)
Sodium: 130 mmol/L — ABNORMAL LOW (ref 135–145)
Sodium: 135 mmol/L (ref 135–145)
Sodium: 136 mmol/L (ref 135–145)

## 2022-12-17 LAB — BLOOD GAS, VENOUS
Acid-base deficit: 5.6 mmol/L — ABNORMAL HIGH (ref 0.0–2.0)
Bicarbonate: 20.1 mmol/L (ref 20.0–28.0)
O2 Saturation: 31.7 %
Patient temperature: 37
pCO2, Ven: 39 mmHg — ABNORMAL LOW (ref 44–60)
pH, Ven: 7.32 (ref 7.25–7.43)
pO2, Ven: 32 mmHg (ref 32–45)

## 2022-12-17 LAB — URINALYSIS, ROUTINE W REFLEX MICROSCOPIC
Bacteria, UA: NONE SEEN
Bilirubin Urine: NEGATIVE
Glucose, UA: >=500 mg/dL — AB
Hgb urine dipstick: NEGATIVE
Ketones, ur: 20 mg/dL — AB
Leukocytes,Ua: NEGATIVE
Nitrite: NEGATIVE
Protein, ur: 30 mg/dL — AB
Specific Gravity, Urine: 1.022 (ref 1.005–1.030)
Squamous Epithelial / HPF: NONE SEEN (ref 0–5)
pH: 6 (ref 5.0–8.0)

## 2022-12-17 LAB — CBG MONITORING, ED
Glucose-Capillary: 142 mg/dL — ABNORMAL HIGH (ref 70–99)
Glucose-Capillary: 148 mg/dL — ABNORMAL HIGH (ref 70–99)
Glucose-Capillary: 171 mg/dL — ABNORMAL HIGH (ref 70–99)
Glucose-Capillary: 190 mg/dL — ABNORMAL HIGH (ref 70–99)
Glucose-Capillary: 198 mg/dL — ABNORMAL HIGH (ref 70–99)
Glucose-Capillary: 279 mg/dL — ABNORMAL HIGH (ref 70–99)
Glucose-Capillary: 282 mg/dL — ABNORMAL HIGH (ref 70–99)
Glucose-Capillary: 387 mg/dL — ABNORMAL HIGH (ref 70–99)
Glucose-Capillary: 473 mg/dL — ABNORMAL HIGH (ref 70–99)
Glucose-Capillary: 552 mg/dL (ref 70–99)
Glucose-Capillary: 573 mg/dL (ref 70–99)
Glucose-Capillary: 592 mg/dL (ref 70–99)
Glucose-Capillary: >600 mg/dL (ref 70–99)

## 2022-12-17 LAB — COMPREHENSIVE METABOLIC PANEL
ALT: 23 U/L (ref 0–44)
AST: 20 U/L (ref 15–41)
Albumin: 3.8 g/dL (ref 3.5–5.0)
Alkaline Phosphatase: 101 U/L (ref 38–126)
Anion gap: 19 — ABNORMAL HIGH (ref 5–15)
BUN: 47 mg/dL — ABNORMAL HIGH (ref 8–23)
CO2: 15 mmol/L — ABNORMAL LOW (ref 22–32)
Calcium: 9 mg/dL (ref 8.9–10.3)
Chloride: 94 mmol/L — ABNORMAL LOW (ref 98–111)
Creatinine, Ser: 2.11 mg/dL — ABNORMAL HIGH (ref 0.61–1.24)
GFR, Estimated: 32 mL/min — ABNORMAL LOW (ref >60)
Glucose, Bld: 787 mg/dL (ref 70–99)
Potassium: 5.6 mmol/L — ABNORMAL HIGH (ref 3.5–5.1)
Sodium: 128 mmol/L — ABNORMAL LOW (ref 135–145)
Total Bilirubin: 2 mg/dL — ABNORMAL HIGH (ref 0.3–1.2)
Total Protein: 6.7 g/dL (ref 6.5–8.1)

## 2022-12-17 LAB — LACTIC ACID, PLASMA
Lactic Acid, Venous: 3.3 mmol/L (ref 0.5–1.9)
Lactic Acid, Venous: 1.6 mmol/L (ref 0.5–1.9)

## 2022-12-17 LAB — TROPONIN I (HIGH SENSITIVITY)
Troponin I (High Sensitivity): 28 ng/L — ABNORMAL HIGH (ref <18)
Troponin I (High Sensitivity): 25 ng/L — ABNORMAL HIGH (ref <18)

## 2022-12-17 LAB — BETA-HYDROXYBUTYRIC ACID
Beta-Hydroxybutyric Acid: 5.17 mmol/L — ABNORMAL HIGH (ref 0.05–0.27)
Beta-Hydroxybutyric Acid: 1.45 mmol/L — ABNORMAL HIGH (ref 0.05–0.27)

## 2022-12-17 MED ORDER — LACTATED RINGERS IV SOLN: INTRAVENOUS | Status: DC

## 2022-12-17 MED ORDER — INSULIN REGULAR(HUMAN) IN NACL 100-0.9 UT/100ML-% IV SOLN
INTRAVENOUS | Status: DC
  Administered 2022-12-17: 11.5 [IU]/h via INTRAVENOUS
  Filled 2022-12-17: qty 100

## 2022-12-17 MED ORDER — CLOPIDOGREL BISULFATE 75 MG PO TABS
75.0000 mg | ORAL_TABLET | Freq: Every day | ORAL | Status: DC
  Administered 2022-12-18 – 2022-12-19 (×2): 75 mg via ORAL
  Filled 2022-12-17 (×2): qty 1

## 2022-12-17 MED ORDER — DEXTROSE IN LACTATED RINGERS 5 % IV SOLN: INTRAVENOUS | Status: DC

## 2022-12-17 MED ORDER — LACTATED RINGERS IV BOLUS
1000.0000 mL | Freq: Once | INTRAVENOUS | Status: AC
  Administered 2022-12-17: 1000 mL via INTRAVENOUS

## 2022-12-17 MED ORDER — AMLODIPINE BESYLATE 10 MG PO TABS
10.0000 mg | ORAL_TABLET | Freq: Every day | ORAL | Status: DC
  Administered 2022-12-18 – 2022-12-19 (×2): 10 mg via ORAL
  Filled 2022-12-17: qty 1
  Filled 2022-12-17: qty 2

## 2022-12-17 MED ORDER — SODIUM CHLORIDE 0.9 % IV SOLN: INTRAVENOUS | Status: DC

## 2022-12-17 MED ORDER — DEXTROSE 50 % IV SOLN: 0.0000 mL | INTRAVENOUS | Status: DC | PRN

## 2022-12-17 MED ORDER — FINASTERIDE 5 MG PO TABS
5.0000 mg | ORAL_TABLET | Freq: Every day | ORAL | Status: DC
  Administered 2022-12-18 – 2022-12-19 (×2): 5 mg via ORAL
  Filled 2022-12-17 (×2): qty 1

## 2022-12-17 MED ORDER — SIMVASTATIN 20 MG PO TABS
20.0000 mg | ORAL_TABLET | Freq: Every day | ORAL | Status: DC
  Administered 2022-12-17 – 2022-12-18 (×2): 20 mg via ORAL
  Filled 2022-12-17: qty 2
  Filled 2022-12-17: qty 1

## 2022-12-17 MED ORDER — METOPROLOL TARTRATE 25 MG PO TABS
12.5000 mg | ORAL_TABLET | Freq: Two times a day (BID) | ORAL | Status: DC
  Administered 2022-12-17 – 2022-12-19 (×4): 12.5 mg via ORAL
  Filled 2022-12-17 (×4): qty 1

## 2022-12-17 MED ORDER — PAROXETINE HCL 30 MG PO TABS
60.0000 mg | ORAL_TABLET | Freq: Every day | ORAL | Status: DC
  Administered 2022-12-18 – 2022-12-19 (×2): 60 mg via ORAL
  Filled 2022-12-17 (×2): qty 2

## 2022-12-17 MED ORDER — DOCUSATE SODIUM 100 MG PO CAPS
100.0000 mg | ORAL_CAPSULE | Freq: Two times a day (BID) | ORAL | Status: DC
  Administered 2022-12-17 – 2022-12-19 (×4): 100 mg via ORAL
  Filled 2022-12-17 (×4): qty 1

## 2022-12-17 MED ORDER — ICOSAPENT ETHYL 1 G PO CAPS
2.0000 g | ORAL_CAPSULE | Freq: Two times a day (BID) | ORAL | Status: DC
  Administered 2022-12-18 – 2022-12-19 (×2): 2 g via ORAL
  Filled 2022-12-17 (×5): qty 2

## 2022-12-17 MED ORDER — TAMSULOSIN HCL 0.4 MG PO CAPS
0.4000 mg | ORAL_CAPSULE | Freq: Every day | ORAL | Status: DC
  Administered 2022-12-18 – 2022-12-19 (×2): 0.4 mg via ORAL
  Filled 2022-12-17 (×2): qty 1

## 2022-12-17 MED ORDER — APIXABAN 5 MG PO TABS
5.0000 mg | ORAL_TABLET | Freq: Two times a day (BID) | ORAL | Status: DC
  Administered 2022-12-17 – 2022-12-19 (×4): 5 mg via ORAL
  Filled 2022-12-17 (×4): qty 1

## 2022-12-17 MED ORDER — SODIUM CHLORIDE 0.9 % IV SOLN
500.0000 mg | INTRAVENOUS | Status: DC
  Administered 2022-12-17 – 2022-12-18 (×2): 500 mg via INTRAVENOUS
  Filled 2022-12-17 (×3): qty 5

## 2022-12-17 MED ORDER — INSULIN ASPART 100 UNIT/ML IJ SOLN
0.0000 [IU] | Freq: Three times a day (TID) | INTRAMUSCULAR | Status: DC
  Administered 2022-12-18: 11 [IU] via SUBCUTANEOUS
  Administered 2022-12-18: 7 [IU] via SUBCUTANEOUS
  Administered 2022-12-18: 3 [IU] via SUBCUTANEOUS
  Administered 2022-12-18: 4 [IU] via SUBCUTANEOUS
  Administered 2022-12-18: 7 [IU] via SUBCUTANEOUS
  Administered 2022-12-19: 4 [IU] via SUBCUTANEOUS
  Administered 2022-12-19: 11 [IU] via SUBCUTANEOUS
  Filled 2022-12-17 (×7): qty 1

## 2022-12-17 MED ORDER — ROPINIROLE HCL 1 MG PO TABS
0.5000 mg | ORAL_TABLET | Freq: Every day | ORAL | Status: DC
  Administered 2022-12-17 – 2022-12-18 (×2): 0.5 mg via ORAL
  Filled 2022-12-17: qty 2
  Filled 2022-12-17: qty 1
  Filled 2022-12-17: qty 2

## 2022-12-17 MED ORDER — INSULIN GLARGINE-YFGN 100 UNIT/ML ~~LOC~~ SOLN
50.0000 [IU] | Freq: Once | SUBCUTANEOUS | Status: AC
  Administered 2022-12-17: 50 [IU] via SUBCUTANEOUS
  Filled 2022-12-17: qty 0.5

## 2022-12-17 MED ORDER — INSULIN ASPART 100 UNIT/ML IJ SOLN
30.0000 [IU] | Freq: Three times a day (TID) | INTRAMUSCULAR | Status: DC
  Filled 2022-12-17: qty 1

## 2022-12-17 MED ORDER — SODIUM CHLORIDE 0.9 % IV SOLN
2.0000 g | INTRAVENOUS | Status: DC
  Administered 2022-12-17 – 2022-12-18 (×2): 2 g via INTRAVENOUS
  Filled 2022-12-17 (×3): qty 20

## 2022-12-17 NOTE — Assessment & Plan Note (Addendum)
In the setting of dehydration and intravascular volume depletion associated with DKA.  Lactic acidosis is not from sepsis, sepsis ruled out.  Lactic acidosis is due to intravascular volume depletion.  Continue IV fluids.  Continue IV fluids

## 2022-12-17 NOTE — Sepsis Progress Note (Signed)
Elink tracking the Code Sepsis. 

## 2022-12-17 NOTE — Assessment & Plan Note (Addendum)
Patient initially presented with altered mental status including visual hallucinations, difficulty finding words, and rambling pressured speech, however no focal findings to suggest CVA.  MRI was obtained for definitive rule out and was negative for any acute findings to explain patient's symptoms.  Likely encephalopathic due to DKA.  By day of discharge, fully alert and oriented

## 2022-12-17 NOTE — Assessment & Plan Note (Signed)
-   Continue home Plavix and statin

## 2022-12-17 NOTE — ED Triage Notes (Signed)
Diabetic patient (CBG "high" upon arrival here) from home with complaints of generalized weakness and increasing confusion; Recently admitted for osteomyelitis, he also has concerns for UTI; A&O x 4

## 2022-12-17 NOTE — Assessment & Plan Note (Addendum)
Patient presenting with urinary frequency and urgency in the setting of significant hyperglycemia with evidence of DKA.  Likely due to medication noncompliance.  There was initial concern for pneumonia as etiology, however patient is denying any respiratory symptoms and is saturating well on room air.  Weaned off insulin drip.  Has started home medications.  Blood sugars now stable.  Patient promises compliance in the future.

## 2022-12-17 NOTE — H&P (Addendum)
History and Physical    Patient: [name] QVZ:[id number] DOB: [birth year] DOA: 12/17/2022 DOS: the patient was seen and examined on 12/17/2022 PCP: [name], MD  Patient coming from: Home  Chief Complaint:  Chief Complaint  Patient presents with   Altered Mental Status    Diabetic patient (CBG "high" upon arrival here) from home with complaints of generalized weakness and increasing confusion; Recently admitted for osteomyelitis, he also has concerns for UTI; A&O x 4   HPI: [name] is a 76 y.o. male with medical history significant of uncontrolled type 2 diabetes, atrial fibrillation on Eliquis, CAD s/p CABG, CVA, hypertension, hyperlipidemia, HFpEF, restless leg syndrome, who presents to the ED with complaints of altered mental status.  Mr. Delahoussaye states that for the last 1 week, he has been having difficulty remembering to take his insulin especially since his insulin was changed from a vial to a pen.  He does not quite understand how to use the pen and due to this, has not been using it.  During this time, he has been noticing increasing urinary frequency and incontinence due to not making it to the restroom in time.  In addition, he has been noticing some brain fogginess and his daughter felt that he was not behaving like himself.  He has noticed that he has been hallucinating his family members being present in his room, particularly his parents whom he states have passed.  He denies any auditory hallucinations.  Otherwise, Mr. Mathe endorses dyspnea on exertion that he states is unchanged from prior.  He denies any shortness of breath, cough, fever, chills, nausea, vomiting, diarrhea, focal weakness.  ED course: On arrival to the ED, patient was hypothermic at 95.1, hypertensive at 161/86 with heart rate of 85.  He was saturating at 98% on room air.  Initial workup remarkable for glucose of 787, Bicarb of 17, anion gap of 17, creatinine of 2.02,  with BUN of 45.  Urinalysis demonstrated ketonuria.  Chest x-ray was obtained with possible mild left lower lobe atelectasis versus infiltrate.  Due to the these findings in addition to hypothermia, patient was started on treatment for community-acquired pneumonia.  Patient was initiated on insulin infusion for DKA.  TRH contacted for admission for DKA.  Review of Systems: As mentioned in the history of present illness. All other systems reviewed and are negative.  Past Medical History:  Diagnosis Date   Atrial fibrillation ([place])    a.) CHA2DS2VASc = 6 (age x 2,  HTN, CVA/TIA x 2, T2DM);  b.) s/p DCCV 09/25/2020 (120 J x1); c.) rate/rhythm maintained on oral metoprolol tartrate; chronically anticoagulated with apixaban   BPH (benign prostatic hyperplasia)    CAD (coronary artery disease) 10/28/2016   a.) LHC 10/28/2016: 95% dLAD, 90% p-mLCx, 75% mLAD, 60% m-dLAD, 99% p-mRCA, 75% dRCA --> consult CVTS; b.) 3v [facility] at [facility] 11/05/2016   Carotid artery disease ([place]) 06/29/2016   a.) carotid US 06/29/2016: <50% BICA   Cerebellar stroke ([place]) 11/13/2015   a.) CT imaging 11/13/2015 revealed old/remote LEFT cerebellar and LEFT carona radiata infarcts   CHF (congestive heart failure) (HCC)    Depression    Diabetic foot ulcers (HCC)    Diastolic dysfunction 32/95/1884   a.) TTE 06/28/2016: EF 55-60%, G1DD; b.) TTE 03/19/2020: EF 55-60%, mild BAE, mild-mod MR/TR.   HLD (hyperlipidemia)    Hypertension    Long term (current) use of anticoagulants    a.) apixaban   Long term current use  of antithrombotics/antiplatelets    a.) clopidogrel   Neuropathy    PTSD (post-traumatic stress disorder)    Restless leg syndrome    a.) on ropinirole   Right pontine stroke ([place]) 06/28/2016   a.) MRI 06/28/2016: acute RIGHT paracentral pontine (non-hemorrhagic) CVA   S/P CABG x 3 11/05/2016   a.) LIMA-LAD, SVG-OM1, SVG-PDA   TIA (transient ischemic attack)    a.) multiple since last CVA in 2017 per daughter    Tubular adenoma of colon    Type 2 diabetes mellitus treated with insulin [facility])    Past Surgical History:  Procedure Laterality Date   AMPUTATION TOE Right 06/14/2022   Procedure: RIGHT GREAT TOE AMPUTATION;  Surgeon: Samara Deist, DPM;  Location: [facility] ORS;  Service: Podiatry;  Laterality: Right;   AMPUTATION TOE Right 09/25/2022   Procedure: AMPUTATION TOE - SECOND;  Surgeon: Samara Deist, DPM;  Location: [facility] ORS;  Service: Podiatry;  Laterality: Right;   AMPUTATION TOE Right 11/25/2022   Procedure: AMPUTATION RIGHT 3RD AND 4TH TOES;  Surgeon: Samara Deist, DPM;  Location: [facility] ORS;  Service: Podiatry;  Laterality: Right;   CARDIAC CATHETERIZATION Left 10/28/2016   Procedure: Left Heart Cath and Coronary Angiography;  Surgeon: Corey Skains, MD;  Location: [place] [facility];  Service: Cardiovascular;  Laterality: Left;   CARDIOVERSION N/A 09/25/2020   Procedure: CARDIOVERSION;  Surgeon: Corey Skains, MD;  Location: [facility] ORS;  Service: Cardiovascular;  Laterality: N/A;   COLONOSCOPY N/A 09/02/2022   Procedure: COLONOSCOPY;  Surgeon: Toledo, Benay Pike, MD;  Location: [facility] ENDOSCOPY;  Service: Gastroenterology;  Laterality: N/A;  IDDM   LOWER EXTREMITY ANGIOGRAPHY Right 06/16/2022   Procedure: Lower Extremity Angiography;  Surgeon: Katha Cabal, MD;  Location: [place] [facility];  Service: Cardiovascular;  Laterality: Right;   SHOULDER ARTHROSCOPY     TRIGGER FINGER RELEASE     UVULOPALATOPHARYNGOPLASTY, TONSILLECTOMY AND SEPTOPLASTY     Social History:  reports that he has quit smoking. His smoking use included cigars. He has never used smokeless tobacco. He reports that he does not drink alcohol and does not use drugs.  No Known Allergies  Family History  Problem Relation Age of Onset   Hypertension Other    Diabetes Mellitus II Other    Diabetes Mother    Diabetes Sister    Atrial fibrillation Sister    Atrial fibrillation Father    Atrial fibrillation  Daughter     Prior to Admission medications   Medication Sig Start Date End Date Taking? Authorizing Provider  acetaminophen (TYLENOL) 325 MG tablet Take 2 tablets (650 mg total) by mouth every 6 (six) hours as needed for mild pain, fever or headache (or Fever >/= 101). 11/28/22   Lorella Nimrod, MD  amLODipine (NORVASC) 10 MG tablet Take 1 tablet (10 mg total) by mouth daily. 11/28/22   Lorella Nimrod, MD  apixaban (ELIQUIS) 5 MG TABS tablet Take 1 tablet (5 mg total) by mouth 2 (two) times daily. 03/20/20   Lavina Hamman, MD  calcium carbonate (TUMS - DOSED IN MG ELEMENTAL CALCIUM) 500 MG chewable tablet Chew 1 tablet by mouth daily as needed for indigestion or heartburn.    [provider]  cholecalciferol (VITAMIN D) 1000 UNITS tablet Take 1,000 Units by mouth daily.    [provider]  clopidogrel (PLAVIX) 75 MG tablet Take 75 mg by mouth daily.    [provider]  docusate sodium (COLACE) 100 MG capsule Take 100 mg by mouth  2 (two) times daily.    [provider]  finasteride (PROSCAR) 5 MG tablet Take 5 mg by mouth daily.    [provider]  furosemide (LASIX) 40 MG tablet Take 20 mg by mouth 2 (two) times daily.    [provider]  gabapentin (NEURONTIN) 100 MG capsule Take 100 mg by mouth 3 (three) times daily.    [provider]  icosapent Ethyl (VASCEPA) 1 g capsule Take 2 g by mouth 2 (two) times daily.     [provider]  insulin aspart (NOVOLOG) 100 UNIT/ML injection Inject 20 Units into the skin 3 (three) times daily before meals. Patient taking differently: Inject 20-25 Units into the skin See admin instructions. Inject 30 units before breakfast, 30 units before lunch, and 35 units before supper 03/20/20   Lavina Hamman, MD  insulin glargine (LANTUS) 100 UNIT/ML injection Inject 0.9 mLs (90 Units total) into the skin at bedtime. Home med. Patient taking differently: Inject 100 Units into the skin at bedtime.  Home med. 06/17/22   Enzo Bi, MD  losartan (COZAAR) 50 MG tablet Take 1 tablet (50 mg total) by mouth daily. 11/28/22   Lorella Nimrod, MD  metFORMIN (GLUCOPHAGE) 1000 MG tablet Take 1,000 mg by mouth 2 (two) times daily with a meal.     [provider]  metoprolol tartrate (LOPRESSOR) 25 MG tablet Take 12.5 mg by mouth 2 (two) times daily.    [provider]  nortriptyline (PAMELOR) 10 MG capsule Take 30 mg by mouth at bedtime.     [provider]  PARoxetine (PAXIL) 40 MG tablet Take 60 mg by mouth daily.     [provider]  polyethylene glycol (MIRALAX / GLYCOLAX) 17 g packet Take 17 g by mouth daily as needed for mild constipation. 11/28/22   Lorella Nimrod, MD  rOPINIRole (REQUIP) 0.5 MG tablet Take 0.5 mg by mouth at bedtime.    [provider]  Semaglutide, 2 MG/DOSE, 8 MG/3ML SOPN Inject 2 mg into the skin every 7 (seven) days. 10/27/22   [provider]  simvastatin (ZOCOR) 40 MG tablet Take 20 mg by mouth at bedtime.    [provider]  tamsulosin (FLOMAX) 0.4 MG CAPS capsule Take 0.4 mg by mouth daily after lunch.     [provider]  vitamin B-12 (CYANOCOBALAMIN) 1000 MCG tablet Take 1,000 mcg by mouth at bedtime.    [provider]    Physical Exam: Vitals:   12/17/22 1500 12/17/22 1545 12/17/22 1600 12/17/22 1630  BP: (!) 151/69 (!) 153/70 130/76 (!) 119/57  Pulse: 72 67 79 75  Resp: (!) 24 (!) 25 (!) 22 16  Temp:      TempSrc:      SpO2: 97% 98% 97% 99%  Weight:      Height:       Physical Exam Vitals and nursing note reviewed.  Constitutional:      General: He is not in acute distress.    Appearance: He is obese. He is not toxic-appearing.  HENT:     Head: Normocephalic and atraumatic.     Mouth/Throat:     Mouth: Mucous membranes are dry.     Pharynx: Oropharynx is clear.  Eyes:     General: No scleral icterus.    Extraocular Movements: Extraocular movements intact.      Conjunctiva/sclera: Conjunctivae normal.     Pupils: Pupils are equal, round, and reactive to light.  Cardiovascular:  Rate and Rhythm: Normal rate. Rhythm irregular.     Heart sounds: No murmur heard.    No gallop.  Pulmonary:     Effort: Pulmonary effort is normal. No respiratory distress.     Breath sounds: Normal breath sounds. No wheezing, rhonchi or rales.  Abdominal:     General: Bowel sounds are normal. There is no distension.     Palpations: Abdomen is soft.     Tenderness: There is no abdominal tenderness. There is no guarding.  Musculoskeletal:     Right lower leg: No edema.     Left lower leg: No edema.  Skin:    General: Skin is warm and dry.  Neurological:     Mental Status: He is alert and oriented to person, place, and time.     Cranial Nerves: No cranial nerve deficit, dysarthria or facial asymmetry.     Comments: 5 out of 5 strength in all 4 extremities.  Sensation intact throughout.  No focal abnormalities noted  Psychiatric:        Attention and Perception: Attention normal.        Mood and Affect: Mood and affect normal.        Speech: Speech is tangential.        Behavior: Behavior is cooperative.        Thought Content: Thought content normal.     Comments: Speech is tangential and rapid but not pressured.    Data Reviewed: CBC with WBC of 10.7, hemoglobin of 11.6, and platelets of 268. CMP with sodium of 128, potassium of 5.6, bicarb of 15, glucose of 787, BUN of 47, creatinine of 2.11, total bilirubin of 2.0, GFR of 32 with anion gap of 19. Initial lactic acid elevated at 3.3 with normalization on repeat. Initial troponin elevated at 28 with decreased to 25 on repeat. Beta hydroxy acid elevated at 5.17. Repeat BMP with sodium of 130, potassium of 4.7, bicarb of 17, glucose of 636, creatinine of 2.02.  Anion gap remains elevated at 17. VBG with pH of 7.32 and pCO2 of 39.  EKG personally reviewed.  Atrial flutter with variable conduction.  Rate of  99 Prior EKG obtained in November 2023 with sinus rhythm.  MR BRAIN WO CONTRAST  Result Date: 12/17/2022 CLINICAL DATA:  Altered mental status.  Generalized weakness. EXAM: MRI HEAD WITHOUT CONTRAST TECHNIQUE: Multiplanar, multiecho pulse sequences of the brain and surrounding structures were obtained without intravenous contrast. COMPARISON:  CT Head 03/18/20 FINDINGS: Brain: No acute infarction, hydrocephalus, extra-axial collection or mass lesion. There is a small focus of microhemorrhage in the right cerebellar hemisphere (series 13, image 10). There is no evidence of surrounding cerebral edema. There are chronic infarcts in the bilateral cerebellar hemispheres. Unchanged likely chronic infarct in the left corona radiata. Vascular: Normal flow voids. Skull and upper cervical spine: Normal marrow signal. Sinuses/Orbits: Negative. Other: None IMPRESSION: 1. No acute intracranial abnormality. 2. Small focus of microhemorrhage in the right cerebellar hemisphere, likely related to chronic hypertensive etiology. 3. Chronic infarcts in the bilateral cerebellar hemispheres and left corona radiata. Electronically Signed   By: Marin Roberts M.D.   On: 12/17/2022 15:57   DG Chest Portable 1 View  Result Date: 12/17/2022 CLINICAL DATA:  Altered mental status.  Diabetes. EXAM: PORTABLE CHEST 1 VIEW COMPARISON:  Chest 03/18/2020 FINDINGS: Postop CABG. Negative for heart failure. Mild left lower lobe airspace disease. Right lung clear. Negative for pleural effusion IMPRESSION: Mild left lower lobe atelectasis/infiltrate. Electronically Signed  By: Franchot Gallo M.D.   On: 12/17/2022 11:34    There are no new results to review at this time.  Assessment and Plan: * DKA (diabetic ketoacidosis) ([place]) Patient presenting with urinary frequency and urgency in the setting of significant hyperglycemia with evidence of DKA.  Likely due to medication noncompliance.  There was initial concern for pneumonia as etiology,  however patient is denying any respiratory symptoms and is saturating well on room air.  - DKA protocol with insulin infusion via Endo tool - IV fluids as ordered - Potassium replacement as needed - BMP every 4 hours and BHA every 8 hours - Transition to subcutaneous once anion gap has closed x 2  AKI (acute kidney injury) ([place]) In the setting of dehydration associated with DKA.  - IV fluid resuscitation - Monitor BMP while admitted - Monitor urine output - Avoid nephrotoxic agents including home Lasix and losartan  Acute encephalopathy Patient presenting with altered mental status including visual hallucinations, difficulty finding words, and rambling pressured speech, however no focal findings to suggest CVA.  MRI was obtained for definitive rule out and was negative for any acute findings to explain patient's symptoms.  Likely encephalopathic due to DKA.  Patient is currently protecting his airway  - Delirium precautions  Atrial flutter, paroxysmal ([place]) - Restart home metoprolol and apixaban  CAD (coronary artery disease) - Continue home Plavix and statin  Advance Care Planning:   Code Status: DNR.  Patient states that he has previously signed a DNR form and that is still within his care goals at this time, however he would like invasive ventilation in the case of pulmonary arrest.  Although patient is altered with rambling speech and possible hallucinations, he expresses understanding of his decisions and maintains capacity to make his medical decisions.  Consults: None  Family Communication: No family at bedside  Severity of Illness: The appropriate patient status for this patient is INPATIENT. Inpatient status is judged to be reasonable and necessary in order to provide the required intensity of service to ensure the patient's safety. The patient's presenting symptoms, physical exam findings, and initial radiographic and laboratory data in the context of their chronic  comorbidities is felt to place them at high risk for further clinical deterioration. Furthermore, it is not anticipated that the patient will be medically stable for discharge from the hospital within 2 midnights of admission.   * I certify that at the point of admission it is my clinical judgment that the patient will require inpatient hospital care spanning beyond 2 midnights from the point of admission due to high intensity of service, high risk for further deterioration and high frequency of surveillance required.*  Author: Jose Persia, MD 12/17/2022 5:07 PM  For on call review [email or web address].

## 2022-12-17 NOTE — Progress Notes (Signed)
       CROSS COVER NOTE  NAME: [name] MRN: [id number] DOB : [birth year]  HPI/Events of Note   Nurse reported gap closed on metabolic panel  Assessment and  Interventions   Assessment: Labs, home inulin regimen and diabetes coordinator notes reviewed  Plan: Transition orders from IV insulin to SQ placed. 1/2 dose - 50 units semglee ordered to give one time. Mealtime 30 units continued with high dose sliding scale       Kathlene Cote NP [facility]

## 2022-12-17 NOTE — Progress Notes (Signed)
CODE SEPSIS - PHARMACY COMMUNICATION  **Broad Spectrum Antibiotics should be administered within 1 hour of Sepsis diagnosis**  Time Code Sepsis Called/Page Received: 1203  Antibiotics Ordered: Ceftriaxone and azithromycin  Time of 1st antibiotic administration: 1234  Additional action taken by pharmacy: [place] ,PharmD Clinical Pharmacist  12/17/2022  12:04 PM

## 2022-12-17 NOTE — Assessment & Plan Note (Signed)
-   Restart home metoprolol and apixaban

## 2022-12-17 NOTE — Inpatient Diabetes Management (Signed)
Inpatient Diabetes Program Recommendations  AACE/ADA: New Consensus Statement on Inpatient Glycemic Control (2015)  Target Ranges:  Prepandial:   less than 140 mg/dL      Peak postprandial:   less than 180 mg/dL (1-2 hours)      Critically ill patients:  140 - 180 mg/dL    Latest Reference Range & Units 11/23/22 16:15  Hemoglobin A1C 4.8 - 5.6 % 11.0 (H)  (H): Data is abnormally high  Latest Reference Range & Units 12/17/22 11:21  Sodium 135 - 145 mmol/L 128 (L)  Potassium 3.5 - 5.1 mmol/L 5.6 (H)  Chloride 98 - 111 mmol/L 94 (L)  CO2 22 - 32 mmol/L 15 (L)  Glucose 70 - 99 mg/dL 787 (HH)  BUN 8 - 23 mg/dL 47 (H)  Creatinine 0.61 - 1.24 mg/dL 2.11 (H)  Calcium 8.9 - 10.3 mg/dL 9.0  Anion gap 5 - 15  19 (H)  (HH): Data is critically high (L): Data is abnormally low (H): Data is abnormally high   Home Meds: Lantus to 100 units Daily            Novolog 30 ac breakfast, 30 units ac lunch, 35 ac dinner            Metformin 1000 mg BID            Ozempic '2mg'$  Qweek            Freestyle Libre CGM  Hospital Meds: IV Insulin Drip   Counseled by the Diabetes RN last admission on 11/25/2022  Note IV Insulin Drip Started at 1:39pm today  Will follow     ENDO: [name] with [facility]--Last seen 08/20/2022 Was told the following: Increase Lantus to 100 QD Switch from Trulicity to Mounjaro 7.5 mg Qweek Take Novolog 30 ac breakfast, 30 units ac lunch, 35 ac dinner Take Metformin 1000 mg BID Scan Libre CGM for sugars at least QAC/HS    --Will follow patient during hospitalization--  Wyn Quaker RN, MSN, [place] Diabetes Coordinator Inpatient Glycemic Control Team Team Pager: [phone] (8a-5p)

## 2022-12-17 NOTE — ED Provider Notes (Signed)
[facility] Provider Note    Event Date/Time   First MD Initiated Contact with Patient 12/17/22 1106     (approximate)   History   Chief Complaint Altered Mental Status (Diabetic patient (CBG "high" upon arrival here) from home with complaints of generalized weakness and increasing confusion; Recently admitted for osteomyelitis, he also has concerns for UTI; A&O x 4)   HPI  [name] is a 76 y.o. male with past medical history of hypertension, diabetes, CAD, and atrial fibrillation who presents to the ED complaining of generalized weakness.  Patient reports that he has been feeling increasingly weak with a cough productive of yellowish sputum over the past 2 weeks.  He denies any fevers, chest pain, or shortness of breath.  He states he has continued to eat and drink normally, but reports significant thirst and urinary frequency.  He admits that he has been unable to administer insulin to himself for at least the past week, since the [place] changed him from insulin injections to an insulin pen.  EMS reports that patient's blood sugar read "high."     Physical Exam   Triage Vital Signs: ED Triage Vitals [12/17/22 1106]  Enc Vitals Group     BP      Pulse      Resp      Temp      Temp src      SpO2      Weight 207 lb 7.3 oz (94.1 kg)     Height 6' (1.829 m)     Head Circumference      Peak Flow      Pain Score 0     Pain Loc      Pain Edu?      Excl. in [place]?     Most recent vital signs: Vitals:   12/17/22 1230 12/17/22 1249  BP: (!) 157/84   Pulse: (!) 131   Resp: 15   Temp:  97.8 F (36.6 C)  SpO2: 97%     Constitutional: Alert and oriented. Eyes: Conjunctivae are normal. Head: Atraumatic. Nose: No congestion/rhinnorhea. Mouth/Throat: Mucous membranes are moist.  Cardiovascular: Tachycardic, irregularly irregular rhythm. Grossly normal heart sounds.  2+ radial pulses bilaterally. Respiratory: Normal respiratory effort.  No  retractions. Lungs CTAB. Gastrointestinal: Soft and nontender. No distention. Musculoskeletal: No lower extremity tenderness nor edema.  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 Neurologic:  Normal speech and language. No gross focal neurologic deficits are appreciated.    ED Results / Procedures / Treatments   Labs (all labs ordered are listed, but only abnormal results are displayed) Labs Reviewed  COMPREHENSIVE METABOLIC PANEL - Abnormal; Notable for the following components:      Result Value   Sodium 128 (*)    Potassium 5.6 (*)    Chloride 94 (*)    CO2 15 (*)    Glucose, Bld 787 (*)    BUN 47 (*)    Creatinine, Ser 2.11 (*)    Total Bilirubin 2.0 (*)    GFR, Estimated 32 (*)    Anion gap 19 (*)    All other components within normal limits  LACTIC ACID, PLASMA - Abnormal; Notable for the following components:   Lactic Acid, Venous 3.3 (*)    All other components within normal limits  CBC WITH DIFFERENTIAL/PLATELET - Abnormal; Notable for the following components:   WBC 10.7 (*)    Hemoglobin 11.6 (*)  HCT 37.2 (*)    Neutro Abs 8.8 (*)    All other components within normal limits  URINALYSIS, ROUTINE W REFLEX MICROSCOPIC - Abnormal; Notable for the following components:   Color, Urine STRAW (*)    APPearance CLEAR (*)    Glucose, UA >=500 (*)    Ketones, ur 20 (*)    Protein, ur 30 (*)    All other components within normal limits  CBG MONITORING, ED - Abnormal; Notable for the following components:   Glucose-Capillary >600 (*)    All other components within normal limits  TROPONIN I (HIGH SENSITIVITY) - Abnormal; Notable for the following components:   Troponin I (High Sensitivity) 28 (*)    All other components within normal limits  CULTURE, BLOOD (ROUTINE X 2)  CULTURE, BLOOD (ROUTINE X 2)  LACTIC ACID, PLASMA  BASIC METABOLIC PANEL  BASIC METABOLIC PANEL  BASIC METABOLIC PANEL  BASIC METABOLIC PANEL   BETA-HYDROXYBUTYRIC ACID  BETA-HYDROXYBUTYRIC ACID  BLOOD GAS, VENOUS  CBG MONITORING, ED  TROPONIN I (HIGH SENSITIVITY)     EKG  ED ECG REPORT I, Blake [name], the attending physician, personally viewed and interpreted this ECG.   Date: 12/17/2022  EKG Time: 11:04  Rate: 103  Rhythm: Atrial flutter with variable block  Axis: Normal  Intervals:right bundle branch block  ST&T Change: None  RADIOLOGY Chest x-ray reviewed and interpreted by me with left-sided infiltrate, no edema or effusion noted.  PROCEDURES:  Critical Care performed: Yes, see critical care procedure note(s)  .Critical Care  Performed by: Blake [name], MD Authorized by: Blake [name], MD   Critical care provider statement:    Critical care time (minutes):  30   Critical care time was exclusive of:  Separately billable procedures and treating other patients and teaching time   Critical care was necessary to treat or prevent imminent or life-threatening deterioration of the following conditions:  Metabolic crisis, endocrine crisis and sepsis   Critical care was time spent personally by me on the following activities:  Development of treatment plan with patient or surrogate, discussions with consultants, evaluation of patient's response to treatment, examination of patient, ordering and review of laboratory studies, ordering and review of radiographic studies, ordering and performing treatments and interventions, pulse oximetry, re-evaluation of patient's condition and review of old charts   I assumed direction of critical care for this patient from another provider in my specialty: no     Care discussed with: admitting provider      [place] ED: Medications  cefTRIAXone (ROCEPHIN) 2 g in sodium chloride 0.9 % 100 mL IVPB (0 g Intravenous Stopped 12/17/22 1304)  azithromycin (ZITHROMAX) 500 mg in sodium chloride 0.9 % 250 mL IVPB (has no administration in time range)  insulin regular,  human (MYXREDLIN) 100 units/ 100 mL infusion (has no administration in time range)  lactated ringers infusion (has no administration in time range)  dextrose 5 % in lactated ringers infusion (0 mLs Intravenous Hold 12/17/22 1309)  dextrose 50 % solution 0-50 mL (has no administration in time range)  lactated ringers bolus 1,000 mL (has no administration in time range)  lactated ringers bolus 1,000 mL (0 mLs Intravenous Stopped 12/17/22 1211)     IMPRESSION / MDM / [place] / ED COURSE  I reviewed the triage vital signs and the nursing notes.  76 y.o. male with past medical history of hypertension, CAD, diabetes, and atrial fibrillation who presents to the ED with increasing weakness and productive cough over the past couple of weeks.  Patient's presentation is most consistent with acute presentation with potential threat to life or bodily function.  Differential diagnosis includes, but is not limited to, hyperglycemia, DKA, dehydration, electrolyte abnormality, sepsis, UTI, pneumonia, medication noncompliance.  Patient nontoxic-appearing and in no acute distress, vital signs remarkable for tachycardia.  EKG shows atrial flutter with variable rate, heart rate has been fluctuating significantly with stable blood pressure here in the ED.  We will hold off on rate control and hydrate with IV fluids given labs consistent with DKA, will start patient on insulin drip.  No supplemental potassium needed as patient noted to be slightly hyperkalemic with AKI.  I am also concerned for sepsis given his tachycardia, hypothermia, and tachypnea.  Chest x-ray shows possible developing infiltrate and we will start on IV antibiotics, lactic acid elevated and we will trend.  No significant anemia or leukocytosis noted.  Case discussed with hospitalist for admission.      FINAL CLINICAL IMPRESSION(S) / ED DIAGNOSES   Final diagnoses:  Diabetic ketoacidosis without coma  associated with type 2 diabetes mellitus (HCC)  Sepsis without acute organ dysfunction, due to unspecified organism ([place])  Pneumonia due to infectious organism, unspecified laterality, unspecified part of lung     Rx / DC Orders   ED Discharge Orders     None        Note:  This document was prepared using Dragon voice recognition software and may include unintentional dictation errors.   Blake [name], MD 12/17/22 1314

## 2022-12-18 ENCOUNTER — Other Ambulatory Visit: Payer: Self-pay

## 2022-12-18 ENCOUNTER — Encounter: Payer: Self-pay | Admitting: Internal Medicine

## 2022-12-18 DIAGNOSIS — E663 Overweight: Secondary | ICD-10-CM | POA: Diagnosis present

## 2022-12-18 DIAGNOSIS — N179 Acute kidney failure, unspecified: Secondary | ICD-10-CM | POA: Diagnosis not present

## 2022-12-18 DIAGNOSIS — G9340 Encephalopathy, unspecified: Secondary | ICD-10-CM | POA: Diagnosis not present

## 2022-12-18 DIAGNOSIS — E1110 Type 2 diabetes mellitus with ketoacidosis without coma: Secondary | ICD-10-CM | POA: Diagnosis not present

## 2022-12-18 DIAGNOSIS — I5032 Chronic diastolic (congestive) heart failure: Secondary | ICD-10-CM

## 2022-12-18 DIAGNOSIS — I4892 Unspecified atrial flutter: Secondary | ICD-10-CM | POA: Diagnosis not present

## 2022-12-18 LAB — CBG MONITORING, ED
Glucose-Capillary: 145 mg/dL — ABNORMAL HIGH (ref 70–99)
Glucose-Capillary: 145 mg/dL — ABNORMAL HIGH (ref 70–99)
Glucose-Capillary: 196 mg/dL — ABNORMAL HIGH (ref 70–99)
Glucose-Capillary: 228 mg/dL — ABNORMAL HIGH (ref 70–99)
Glucose-Capillary: 243 mg/dL — ABNORMAL HIGH (ref 70–99)
Glucose-Capillary: 248 mg/dL — ABNORMAL HIGH (ref 70–99)
Glucose-Capillary: 257 mg/dL — ABNORMAL HIGH (ref 70–99)

## 2022-12-18 LAB — CBC WITH DIFFERENTIAL/PLATELET
Abs Immature Granulocytes: 0.03 10*3/uL (ref 0.00–0.07)
Basophils Absolute: 0.1 10*3/uL (ref 0.0–0.1)
Basophils Relative: 1 %
Eosinophils Absolute: 0.2 10*3/uL (ref 0.0–0.5)
Eosinophils Relative: 3 %
HCT: 31.5 % — ABNORMAL LOW (ref 39.0–52.0)
Hemoglobin: 10.1 g/dL — ABNORMAL LOW (ref 13.0–17.0)
Immature Granulocytes: 0 %
Lymphocytes Relative: 22 %
Lymphs Abs: 1.9 10*3/uL (ref 0.7–4.0)
MCH: 26.6 pg (ref 26.0–34.0)
MCHC: 32.1 g/dL (ref 30.0–36.0)
MCV: 83.1 fL (ref 80.0–100.0)
Monocytes Absolute: 0.8 10*3/uL (ref 0.1–1.0)
Monocytes Relative: 10 %
Neutro Abs: 5.6 10*3/uL (ref 1.7–7.7)
Neutrophils Relative %: 64 %
Platelets: 225 10*3/uL (ref 150–400)
RBC: 3.79 MIL/uL — ABNORMAL LOW (ref 4.22–5.81)
RDW: 15 % (ref 11.5–15.5)
WBC: 8.7 10*3/uL (ref 4.0–10.5)
nRBC: 0 % (ref 0.0–0.2)

## 2022-12-18 LAB — BASIC METABOLIC PANEL
Anion gap: 6 (ref 5–15)
BUN: 32 mg/dL — ABNORMAL HIGH (ref 8–23)
CO2: 24 mmol/L (ref 22–32)
Calcium: 8.5 mg/dL — ABNORMAL LOW (ref 8.9–10.3)
Chloride: 108 mmol/L (ref 98–111)
Creatinine, Ser: 1.45 mg/dL — ABNORMAL HIGH (ref 0.61–1.24)
GFR, Estimated: 50 mL/min — ABNORMAL LOW (ref >60)
Glucose, Bld: 153 mg/dL — ABNORMAL HIGH (ref 70–99)
Potassium: 3.9 mmol/L (ref 3.5–5.1)
Sodium: 138 mmol/L (ref 135–145)

## 2022-12-18 LAB — BETA-HYDROXYBUTYRIC ACID: Beta-Hydroxybutyric Acid: 0.69 mmol/L — ABNORMAL HIGH (ref 0.05–0.27)

## 2022-12-18 LAB — HEPATIC FUNCTION PANEL
ALT: 17 U/L (ref 0–44)
AST: 19 U/L (ref 15–41)
Albumin: 3 g/dL — ABNORMAL LOW (ref 3.5–5.0)
Alkaline Phosphatase: 80 U/L (ref 38–126)
Bilirubin, Direct: <0.1 mg/dL (ref 0.0–0.2)
Total Bilirubin: 0.9 mg/dL (ref 0.3–1.2)
Total Protein: 5.7 g/dL — ABNORMAL LOW (ref 6.5–8.1)

## 2022-12-18 LAB — GLUCOSE, CAPILLARY: Glucose-Capillary: 276 mg/dL — ABNORMAL HIGH (ref 70–99)

## 2022-12-18 LAB — MAGNESIUM: Magnesium: 2.6 mg/dL — ABNORMAL HIGH (ref 1.7–2.4)

## 2022-12-18 MED ORDER — SODIUM CHLORIDE 0.9 % IV SOLN: INTRAVENOUS | Status: DC

## 2022-12-18 NOTE — ED Notes (Signed)
Request made for transport to the floor ?

## 2022-12-18 NOTE — Progress Notes (Signed)
[facility] Progress Note  Patient: [name]    YHC:[id number]  DOA: 12/17/2022    Date of Service: the patient was seen and examined on 12/18/2022  Brief hospital course: 76 year old male with past medical history of diabetes mellitus type 2 on insulin, atrial fibrillation on Eliquis, CAD status post CABG, diastolic heart failure and restless leg syndrome who presents to the emergency room on 12/21 with confusion.  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  Family had noted that patient had been having hallucinations, seeing family members who had passed away.  Lab work revealed patient in DKA.   Assessment and Plan: * DKA (diabetic ketoacidosis) ([place]) Patient presenting with urinary frequency and urgency in the setting of significant hyperglycemia with evidence of DKA.  Likely due to medication noncompliance.  There was initial concern for pneumonia as etiology, however patient is denying any respiratory symptoms and is saturating well on room air.  Weaned off insulin drip.  Has started home medications.  Blood sugars improving.  Acute encephalopathy Patient presenting with altered mental status including visual hallucinations, difficulty finding words, and rambling pressured speech, however no focal findings to suggest CVA.  MRI was obtained for definitive rule out and was negative for any acute findings to explain patient's symptoms.  Likely encephalopathic due to DKA.  Patient is currently protecting his airway  - Delirium precautions  AKI (acute kidney injury) ([place]) in the setting of stage II chronic kidney disease In the setting of dehydration and intravascular volume depletion associated with DKA.  Lactic acidosis is not from sepsis, sepsis ruled out.  Lactic acidosis is due to intravascular volume depletion.  Continue IV fluids.  Continue IV  fluids   Chronic diastolic CHF (congestive heart failure) (HCC) History of grade 2 diastolic dysfunction.  Check BNP in the morning.  Atrial flutter, paroxysmal (HCC) - Restart home metoprolol and apixaban  CAD (coronary artery disease) - Continue home Plavix and statin  Overweight (BMI 25.0-29.9) Meets criteria with BMI greater than 25       Body mass index is 28.14 kg/m.        Consultants: Diabetes coordinator  Procedures: None  Antimicrobials: Rocephin and Zithromax  Code Status: Full code   Subjective: Feeling okay  Objective: Vital signs were reviewed and unremarkable. Vitals:   12/18/22 1200 12/18/22 1617  BP: 132/77 125/66  Pulse: 76 75  Resp: 15 18  Temp:  98.9 F (37.2 C)  SpO2: 98% 99%    Intake/Output Summary (Last 24 hours) at 12/18/2022 1759 Last data filed at 12/17/2022 1935 Gross per 24 hour  Intake 607.02 ml  Output --  Net 607.02 ml   Filed Weights   12/17/22 1106  Weight: 94.1 kg   Body mass index is 28.14 kg/m.  Exam:  General: Alert and oriented x 3, no acute distress HEENT: Normocephalic, atraumatic, mucous membranes are moist Cardiovascular: Regular rate and rhythm, S1-S2 Respiratory: Clear to auscultation bilaterally Abdomen: Soft, nontender, nondistended, hypoactive bowel sounds Musculoskeletal: No clubbing or cyanosis or edema Skin: No skin breaks, tears or lesions Psychiatry: Appropriate, no evidence of psychoses Neurology: No focal deficits  Data Reviewed: Creatinine down to 1.45 with GFR 50  Disposition:  Status is: Observation     Anticipated discharge date: 12/23  Remaining issues to be resolved so that patient can be discharged:  -Improvement in renal function   Family  Communication: Updated son at the bedside DVT Prophylaxis:  apixaban Arne Cleveland) tablet 5 mg    Author: Annita Brod ,MD 12/18/2022 5:59 PM  To reach On-call, see care teams to locate the attending and reach out via  [email or web address]. Between 7PM-7AM, please contact night-coverage If you still have difficulty reaching the attending provider, please page the [facility] (Director on Call) for [facility] on amion for assistance.

## 2022-12-18 NOTE — Inpatient Diabetes Management (Addendum)
Inpatient Diabetes Program Recommendations  AACE/ADA: New Consensus Statement on Inpatient Glycemic Control (2015)  Target Ranges:  Prepandial:   less than 140 mg/dL      Peak postprandial:   less than 180 mg/dL (1-2 hours)      Critically ill patients:  140 - 180 mg/dL    Latest Reference Range & Units 12/17/22 19:33 12/17/22 20:30 12/17/22 21:40 12/17/22 22:32 12/17/22 23:32 12/18/22 00:39 12/18/22 01:43  Glucose-Capillary 70 - 99 mg/dL 198 (H) 171 (H) 190 (H) 148 (H) 142 (H)  50 units Semglee _0  145 (H) 196 (H)  IV Insulin Drip Stopped _1   4 units Novolog   (H): Data is abnormally high  Latest Reference Range & Units 12/18/22 07:46  Glucose-Capillary 70 - 99 mg/dL 145 (H)  3 units Novolog   (H): Data is abnormally high   Admit: DKA/ AKI/ AMS  Home Meds: Lantus to 100 units Daily            Novolog 30 ac breakfast, 30 units ac lunch, 35 ac dinner            Metformin 1000 mg BID            Ozempic 76m Qweek            Freestyle Libre CGM    Hospital Meds: Novolog Resistant Correction Scale/ SSI (0-20 units) TID AC + HS              Novolog 30 units TID with meals    MD- Note pt given 50 units Semglee at midnight last night for transition off the IV Insulin Drip  Will need Basal Insulin for tonight  CBG looks OK this AM (145) with the 50 units Semglee  Please order Semglee 50 units QHS for tonight    ENDO: Dr. SGabriel Carinawith [facility]--Last seen 08/20/2022 Was told the following: Increase Lantus to 100 QD Switch from Trulicity to Mounjaro 7.5 mg Qweek Take Novolog 30 ac breakfast, 30 units ac lunch, 35 ac dinner Take Metformin 1000 mg BID Scan Libre CGM for sugars at least QAC/HS    Addendum 1pm--Met w/ pt down in the ED (son and dtr-in-law also present).  Pt told me he didn't take his insulin for about 1 week b/c he didn't "feel like it".  [facility]us [facility] Novolog insulin pens and he doesn't know how to use them--Was using vials prior  and has been using the vials to try to use them up.  States no one ever showed him how to use the insulin pens.  States he knows it was a bad thing to not take his insulin.  Discussed with patient and family diagnosis of DKA (pathophysiology), treatment of DKA, lab results, and transition plan to SQ insulin regimen.  Discussed w/ pt and family that pt HAS to have his basal insulin daily to prevent DKA.  We talked about how Lantus and Novolog work, reviewed when to take, importance of timing, doses prescribed by pt's ENDO--Pt and family reminded that Lantus needs to be give at the same time every day.  I also thoroughly reviewed with pt and family the most recent doses of insulin prescribed by Dr. SGabriel Carina(08/20/2022).  I was able to demonstrate how to correctly use an insulin pen at home and showed both the pt and the son.  Pt able to provide return demo with minimal prompting.  Educated patient and spouse on insulin pen use at home.  Reviewed all  steps of insulin pen including attachment of needle, 2-unit air (test) shot, dialing up dose, giving injection, rotation of injection sites, removing needle.  I had to review with pt and son that each line on the insulin pen is equal to 1 unit insulin and pt will need to dial up to the dose prescribed by Dr. Deanne Coffer example, pt needs to dial up to 30 units on the Novolog pen for breakfast.  I have emailed the son a Video on using insulin pens at home and placed QR code on the AVS.  I have also placed detailed pen use instructions and Dr. Joycie Peek last visit notes on amounts of insulin in the AVS in large print.  I asked pt's son if he can please come check on pt daily for the next few weeks as pt restarts his insulin at home and uses his new insulin pens--Son agreeable.       --Will follow patient during hospitalization--  Wyn Quaker RN, MSN, [place] Diabetes Coordinator Inpatient Glycemic Control Team Team Pager: [phone] (8a-5p)

## 2022-12-18 NOTE — Care Management CC44 (Signed)
Condition Code 44 Documentation Completed  Patient Details  Name: [name] MRN: [id number] Date of Birth: [birth year]   Condition Code 44 given:  Yes Patient signature on Condition Code 44 notice:  Yes Documentation of 2 MD's agreement:  Yes Code 44 added to claim:  Yes    Rodney Booze, LCSW 12/18/2022, 4:52 PM

## 2022-12-18 NOTE — Care Management Important Message (Signed)
Important Message  Patient Details  Name: KELVEN FLATER MRN: [id number] Date of Birth: 10-06-1946   Medicare Important Message Given:  Yes     Rodney Booze, LCSW 12/18/2022, 4:52 PM

## 2022-12-18 NOTE — ED Notes (Signed)
Reached out to MD for clarification on Novolog order for 30 units, pt CBG 145.  Hold 30u and stop IVF per MD.

## 2022-12-18 NOTE — Plan of Care (Addendum)
Patient received from the ED,alert and orientedx4 on room air with no resp distress.Belongings documented,skin check done with Christoper Fabian.Safety measures in place and bed in its lowest position. Problem: Education: Goal: Ability to describe self-care measures that may prevent or decrease complications (Diabetes Survival Skills Education) will improve Outcome: Progressing Goal: Individualized Educational Video(s) Outcome: Progressing   Problem: Coping: Goal: Ability to adjust to condition or change in health will improve Outcome: Progressing   Problem: Metabolic: Goal: Ability to maintain appropriate glucose levels will improve Outcome: Progressing   Problem: Nutritional: Goal: Maintenance of adequate nutrition will improve Outcome: Progressing   Problem: Skin Integrity: Goal: Risk for impaired skin integrity will decrease Outcome: Progressing

## 2022-12-18 NOTE — Hospital Course (Signed)
76 year old male with past medical history of diabetes mellitus type 2 on insulin, atrial fibrillation on Eliquis, CAD status post CABG, diastolic heart failure and restless leg syndrome who presents to the emergency room on 12/21 with confusion.  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  Family had noted that patient had been having hallucinations, seeing family members who had passed away.  Lab work revealed patient in DKA.  Patient started on insulin drip plus IV fluids.  By 12/22, DKA has resolved.  Home medicines slowly restarted.

## 2022-12-18 NOTE — Care Management Obs Status (Signed)
[place] NOTIFICATION   Patient Details  Name: [name] MRN: [id number] Date of Birth: 1946-03-21   Medicare Observation Status Notification Given:  Yes    Rodney Booze, LCSW 12/18/2022, 4:52 PM

## 2022-12-18 NOTE — Assessment & Plan Note (Signed)
Meets criteria with BMI greater than 25 

## 2022-12-18 NOTE — Assessment & Plan Note (Signed)
History of grade 2 diastolic dysfunction.  Check BNP in the morning.

## 2022-12-18 NOTE — Discharge Instructions (Addendum)
Using Your Insulin Pen: Wash your hands with soap and water for at least 20 seconds or can use hand sanitizer  Make sure the insulin pen has the right kind of insulin and there is enough insulin in the pen for the dose.  Remove the cap from the insulin pen.  Use an alcohol wipe to clean the rubber tip of the pen.   Remove the protective paper tab from the disposable needle. Do not let the needle touch anything.  Screw a new, unused needle onto the pen.  Remove the outer plastic needle cover and the Inner plastin needle cover  Dial the insulin pen to 2 units and perform the Safety Test--Hold the pen with the needle pointing up, and push the button on the opposite end of the pen until a drop of insulin appears at the needle tip. If no insulin appears, repeat this step.  Turn the button (dial) to the number of units of insulin that you will be injecting. Inject the insulin Use an alcohol wipe to clean the site where you will be inserting the needle. Let the site air-dry.  Gently but quickly, use your writing hand to put the needle straight into the skin. Insert the needle at a 45-degree angle or a 90-degree angle (perpendicular) to the skin  When the needle is completely inserted into the skin, Use your thumb or index finger of your writing hand to push the top button of the pen all the way to inject the insulin. Continue to hold the pen in place with your writing hand.  Wait 6 seconds, then pull the needle straight out of the skin. This will allow all of the insulin to go from the pen and needle into your body.  Carefully unscrew the Insulin Pen Needle and Discard the needle  6. Place the Insulin Pen cap back on the pen and Store the Insulin pen somewhere safe    Instructions from Dr. Joycie Peek Office: Take Lantus to 100 units Daily Ozempic 2 mg Once Weekly Take Novolog 30 units before breakfast, 30 units before lunch, 35 before dinner Take Metformin 1000 mg BID Scan Libre CGM for  sugars at least before meals and at bedtime

## 2022-12-18 NOTE — ED Notes (Signed)
Meal tray at bedside.  

## 2022-12-18 NOTE — ED Notes (Signed)
Patient transported to bed assignment with transport tech at this time

## 2022-12-19 DIAGNOSIS — I5032 Chronic diastolic (congestive) heart failure: Secondary | ICD-10-CM | POA: Diagnosis not present

## 2022-12-19 DIAGNOSIS — E663 Overweight: Secondary | ICD-10-CM | POA: Diagnosis not present

## 2022-12-19 DIAGNOSIS — E1110 Type 2 diabetes mellitus with ketoacidosis without coma: Secondary | ICD-10-CM | POA: Diagnosis not present

## 2022-12-19 DIAGNOSIS — I4892 Unspecified atrial flutter: Secondary | ICD-10-CM | POA: Diagnosis not present

## 2022-12-19 LAB — GLUCOSE, CAPILLARY
Glucose-Capillary: 126 mg/dL — ABNORMAL HIGH (ref 70–99)
Glucose-Capillary: 154 mg/dL — ABNORMAL HIGH (ref 70–99)
Glucose-Capillary: 265 mg/dL — ABNORMAL HIGH (ref 70–99)

## 2022-12-19 LAB — BASIC METABOLIC PANEL
Anion gap: 8 (ref 5–15)
BUN: 25 mg/dL — ABNORMAL HIGH (ref 8–23)
CO2: 22 mmol/L (ref 22–32)
Calcium: 8.5 mg/dL — ABNORMAL LOW (ref 8.9–10.3)
Chloride: 112 mmol/L — ABNORMAL HIGH (ref 98–111)
Creatinine, Ser: 1.34 mg/dL — ABNORMAL HIGH (ref 0.61–1.24)
GFR, Estimated: 55 mL/min — ABNORMAL LOW (ref >60)
Glucose, Bld: 115 mg/dL — ABNORMAL HIGH (ref 70–99)
Potassium: 3.7 mmol/L (ref 3.5–5.1)
Sodium: 142 mmol/L (ref 135–145)

## 2022-12-19 LAB — PROCALCITONIN: Procalcitonin: <0.1 ng/mL

## 2022-12-19 LAB — BRAIN NATRIURETIC PEPTIDE: B Natriuretic Peptide: 166.1 pg/mL — ABNORMAL HIGH (ref 0.0–100.0)

## 2022-12-19 MED ORDER — INSULIN GLARGINE-YFGN 100 UNIT/ML ~~LOC~~ SOLN
50.0000 [IU] | SUBCUTANEOUS | Status: AC
  Administered 2022-12-19: 50 [IU] via SUBCUTANEOUS
  Filled 2022-12-19: qty 0.5

## 2022-12-19 MED ORDER — AZITHROMYCIN 250 MG PO TABS: 500.0000 mg | ORAL_TABLET | Freq: Every day | ORAL | Status: DC

## 2022-12-19 NOTE — Plan of Care (Signed)
  Problem: Health Behavior/Discharge Planning: Goal: Ability to identify and utilize available resources and services will improve 12/19/2022 1348 by Clint Lipps, RN Outcome: Adequate for Discharge 12/19/2022 1330 by Clint Lipps, RN Outcome: Progressing

## 2022-12-19 NOTE — Progress Notes (Signed)
Inpatient Diabetes Program Recommendations  AACE/ADA: New Consensus Statement on Inpatient Glycemic Control (2015)  Target Ranges:  Prepandial:   less than 140 mg/dL      Peak postprandial:   less than 180 mg/dL (1-2 hours)      Critically ill patients:  140 - 180 mg/dL   Lab Results  Component Value Date   GLUCAP 265 (H) 12/19/2022   HGBA1C 11.0 (H) 11/23/2022    Review of Glycemic Control  Latest Reference Range & Units 12/18/22 16:44 12/18/22 20:52 12/18/22 21:48 12/19/22 05:35 12/19/22 08:09 12/19/22 11:22  Glucose-Capillary 70 - 99 mg/dL 243 (H) 257 (H) 276 (H) 126 (H) 154 (H) 265 (H)  Admit: DKA/ AKI/ AMS   Home Meds: Lantus to 100 units Daily            Novolog 30 ac breakfast, 30 units ac lunch, 35 ac dinner            Metformin 1000 mg BID            Ozempic '2mg'$  Qweek            Freestyle Libre CGM     Hospital Meds: Novolog Resistant Correction Scale/ SSI (0-20 units) TID AC + HS Inpatient Diabetes Program Recommendations:    Please restart Semglee 50 units daily.    Thanks,  Adah Perl, RN, BC-ADM Inpatient Diabetes Coordinator Pager [phone]  (8a-5p)

## 2022-12-19 NOTE — TOC Initial Note (Addendum)
Transition of Care [facility]) - Initial/Assessment Note    Patient Details  Name: [name] MRN: [id number] Date of Birth: [birth year]  Transition of Care [facility]) CM/SW Contact:    Magnus Ivan, LCSW Phone Number: 12/19/2022, 10:29 AM  Clinical Narrative:                 Patient known to [facility] for recent admission this month. Patient was set up with [facility] services at [place] earlier this month. CSW has reached out to [place] with [facility] who confirmed they are still active with patient for RN, PT and OT. See below note from [facility] Initial Note.   "Admitted from: home alone P[facility] [place] Current home health/prior home health/DME:[facility] (4 wheels);[facility]"  Expected Discharge Plan: [place] Barriers to Discharge: Continued Medical Work up   Patient Goals and CMS Choice            Expected Discharge Plan and Services       Living arrangements for the past 2 months: Single Family Home                                      Prior Living Arrangements/Services Living arrangements for the past 2 months: Single Family Home Lives with:: Self              Current home services: DME, Home PT    Activities of Daily Living Home Assistive Devices/Equipment: Cane (specify quad or straight), Dentures (specify type), Eyeglasses ADL Screening (condition at time of admission) Patient's cognitive ability adequate to safely complete daily activities?: Yes Is the patient deaf or have difficulty hearing?: No Does the patient have difficulty seeing, even when wearing glasses/contacts?: No Does the patient have difficulty concentrating, remembering, or making decisions?: No Patient able to express need for assistance with ADLs?: Yes Does the patient have difficulty dressing or bathing?: No Independently performs ADLs?: Yes (appropriate for developmental age) Does the patient have difficulty walking or climbing  stairs?: No Weakness of Legs: None Weakness of Arms/Hands: None  Permission Sought/Granted                  Emotional Assessment              Admission diagnosis:  DKA (diabetic ketoacidosis) ([place]) [E11.10] Diabetic ketoacidosis without coma associated with type 2 diabetes mellitus ([place]) [E11.10] Pneumonia due to infectious organism, unspecified laterality, unspecified part of lung [J18.9] Sepsis without acute organ dysfunction, due to unspecified organism [facility]) [A41.9] Patient Active Problem List   Diagnosis Date Noted   Overweight (BMI 25.0-29.9) 12/18/2022   DKA (diabetic ketoacidosis) ([place]) 12/17/2022   Acute encephalopathy 12/17/2022   Osteomyelitis ([place]) 11/23/2022   Restless leg syndrome 11/23/2022   Encounter for screening for malignant neoplasm of colon 06/25/2022   Osteomyelitis of great toe of right foot ([place]) 06/13/2022   Chronic kidney disease, stage 3a ([place])    Chronic diastolic CHF (congestive heart failure) ([place])    Type II diabetes mellitus with renal manifestations ([place])    Depression    Atrial fibrillation, chronic ([place])    History of colonic polyps 05/05/2022   Chronic daily headache 11/10/2021   History of stroke 11/10/2021   Difficulty sleeping 11/10/2021   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 ([place]) 05/12/2021   Hypersomnia with sleep apnea 05/12/2021   Long  term (current) use of anticoagulants 05/12/2021   Other dysfunctions of sleep stages or arousal from sleep 05/12/2021   Posttraumatic stress disorder 05/12/2021   Presbyopia 05/12/2021   Psychosexual dysfunction with inhibited sexual excitement 05/12/2021   Type 2 diabetes mellitus with hyperglycemia ([place]) 05/12/2021   Atherosclerosis of native arteries of the extremities with ulceration ([place]) 01/01/2021   Atrial flutter, paroxysmal ([place]) 03/21/2020   Postural dizziness with presyncope 03/18/2020   Hypoglycemia associated with type 2 diabetes mellitus ([place])  03/18/2020   Hx of CABG 03/18/2020   History of CVA (cerebrovascular accident) 03/18/2020   Class 2 severe obesity due to excess calories with serious comorbidity and body mass index (BMI) of 35.0 to 35.9 in adult [facility]) 01/12/2020   Diabetic neuropathy ([place]) 01/04/2020   Occlusion and stenosis of vertebral artery 01/04/2020   Sciatica 01/04/2020   Health care maintenance 11/27/2019   Sleeping difficulty 09/13/2019   LVH (left ventricular hypertrophy) due to hypertensive disease, without heart failure 09/22/2018   CAD (coronary artery disease) 11/12/2016   Bilateral carotid artery disease ([place]) 07/14/2016   HTN (hypertension) 06/27/2016   HLD (hyperlipidemia) 06/27/2016   BPH (benign prostatic hyperplasia) 06/27/2016   AKI (acute kidney injury) ([place]) in the setting of stage II chronic kidney disease 06/27/2016   Vitamin D deficiency 03/30/2016   Vitamin B 12 deficiency 03/30/2016   At risk for psychosocial dysfunction 12/01/2015   PCP:  [name], MD Pharmacy:   [id number] [place] [place] [address] [id number]: [phone]: [address]Phone: [phone]: [phone]    Social Determinants of Health ([place] Social History: [place] No Food Insecurity (12/18/2022)  Housing: Low Risk  (12/18/2022)  Transportation Needs: No Transportation Needs (12/18/2022)  Utilities: Not At Risk (12/18/2022)  Financial Resource Strain: Low Risk  (03/01/2018)  Physical Activity: Inactive (03/01/2018)  Social Connections: Unknown (03/01/2018)  Stress: No Stress Concern Present (03/01/2018)  Tobacco Use: Medium Risk (12/18/2022)   SDOH Interventions:     Readmission Risk Interventions    11/25/2022   12:26 PM  Readmission Risk Prevention Plan  Transportation Screening Complete  HRI or Home Care Consult Complete   Palliative Care Screening Complete  Medication Review (RN Care Manager) Complete

## 2022-12-19 NOTE — Discharge Summary (Addendum)
Physician Discharge Summary   Patient: [name] MRN: [id number] DOB: 1946-10-11  Admit date:     12/17/2022  Discharge date: 12/19/22  Discharge Physician: Annita Brod   PCP: [name], MD   Recommendations at discharge:   Patient discharged home, he will follow-up with his PCP in the next 1 month.  Discharge Diagnoses: Active Problems:   Type II diabetes mellitus with renal manifestations (HCC)   Chronic diastolic CHF (congestive heart failure) (HCC)   Atrial flutter, paroxysmal (HCC)   CAD (coronary artery disease)   HLD (hyperlipidemia)   Overweight (BMI 25.0-29.9)  Principal Problem (Resolved):   DKA (diabetic ketoacidosis) ([place]) Resolved Problems:   Acute encephalopathy   AKI (acute kidney injury) ([place]) in the setting of stage II chronic kidney disease  Hospital Course: 76 year old male with past medical history of diabetes mellitus type 2 on insulin, atrial fibrillation on Eliquis, CAD status post CABG, diastolic heart failure and restless leg syndrome who presents to the emergency room on 12/21 with confusion.  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  Family had noted that patient had been having hallucinations, seeing family members who had passed away.  Lab work revealed patient in DKA.  Patient started on insulin drip plus IV fluids.  By 12/22, DKA has resolved.  Home medicines slowly restarted.  Assessment and Plan: * DKA (diabetic ketoacidosis) (HCC)-resolved as of 12/19/2022 Patient presenting with urinary frequency and urgency in the setting of significant hyperglycemia with evidence of DKA.  Likely due to medication noncompliance.  There was initial concern for pneumonia as etiology, however patient is denying any respiratory symptoms and is saturating well on room air.  Weaned off insulin drip.  Has started home  medications.  Blood sugars now stable.  Patient promises compliance in the future.  Acute encephalopathy-resolved as of 12/19/2022 Patient initially presented with altered mental status including visual hallucinations, difficulty finding words, and rambling pressured speech, however no focal findings to suggest CVA.  MRI was obtained for definitive rule out and was negative for any acute findings to explain patient's symptoms.  Likely encephalopathic due to DKA.  By day of discharge, fully alert and oriented  AKI (acute kidney injury) ([place]) in the setting of stage II chronic kidney disease-resolved as of 12/19/2022 In the setting of dehydration and intravascular volume depletion associated with DKA.  Lactic acidosis is not from sepsis, sepsis ruled out.  Lactic acidosis is due to intravascular volume depletion.  Continue IV fluids.  At discharge, creatinine 1.34 with GFR 55 patient continued on fluids for another 9 hours before discharge   Chronic diastolic CHF (congestive heart failure) (HCC) History of grade 2 diastolic dysfunction.  BNP minimally elevated and in the setting of AKI.  Atrial flutter, paroxysmal (HCC) - Restart home metoprolol and apixaban  CAD (coronary artery disease) - Continue home Plavix and statin  Overweight (BMI 25.0-29.9) Meets criteria with BMI greater than 25         Consultants: None Procedures performed: None Disposition: Home Diet recommendation:  Discharge Diet Orders (From admission, onward)     Start     Ordered   12/19/22 0000  Diet Carb Modified        12/19/22 1359           Carb modified diet DISCHARGE MEDICATION: Allergies as of 12/19/2022   No Known Allergies  Medication List     TAKE these medications    acetaminophen 325 MG tablet Commonly known as: TYLENOL Take 2 tablets (650 mg total) by mouth every 6 (six) hours as needed for mild pain, fever or headache (or Fever >/= 101).   amLODipine 10 MG tablet Commonly  known as: NORVASC Take 1 tablet (10 mg total) by mouth daily.   apixaban 5 MG Tabs tablet Commonly known as: ELIQUIS Take 1 tablet (5 mg total) by mouth 2 (two) times daily.   calcium carbonate 500 MG chewable tablet Commonly known as: TUMS - dosed in mg elemental calcium Chew 1 tablet by mouth daily as needed for indigestion or heartburn.   cholecalciferol 1000 units tablet Commonly known as: VITAMIN D Take 1,000 Units by mouth daily.   clopidogrel 75 MG tablet Commonly known as: PLAVIX Take 75 mg by mouth daily.   cyanocobalamin 1000 MCG tablet Commonly known as: VITAMIN B12 Take 1,000 mcg by mouth at bedtime.   docusate sodium 100 MG capsule Commonly known as: COLACE Take 100 mg by mouth 2 (two) times daily.   finasteride 5 MG tablet Commonly known as: PROSCAR Take 5 mg by mouth daily.   furosemide 40 MG tablet Commonly known as: LASIX Take 20 mg by mouth 2 (two) times daily.   gabapentin 100 MG capsule Commonly known as: NEURONTIN Take 100 mg by mouth 3 (three) times daily.   icosapent Ethyl 1 g capsule Commonly known as: VASCEPA Take 2 g by mouth 2 (two) times daily.   insulin aspart 100 UNIT/ML injection Commonly known as: novoLOG Inject 20 Units into the skin 3 (three) times daily before meals. What changed:  how much to take when to take this additional instructions   insulin glargine 100 UNIT/ML injection Commonly known as: Lantus Inject 0.9 mLs (90 Units total) into the skin at bedtime. Home med. What changed: how much to take   losartan 50 MG tablet Commonly known as: COZAAR Take 1 tablet (50 mg total) by mouth daily.   metFORMIN 1000 MG tablet Commonly known as: GLUCOPHAGE Take 1,000 mg by mouth 2 (two) times daily with a meal.   metoprolol tartrate 25 MG tablet Commonly known as: LOPRESSOR Take 12.5 mg by mouth 2 (two) times daily.   nortriptyline 10 MG capsule Commonly known as: PAMELOR Take 30 mg by mouth at bedtime.   PARoxetine  40 MG tablet Commonly known as: PAXIL Take 60 mg by mouth daily.   polyethylene glycol 17 g packet Commonly known as: MIRALAX / GLYCOLAX Take 17 g by mouth daily as needed for mild constipation.   rOPINIRole 0.5 MG tablet Commonly known as: REQUIP Take 0.5 mg by mouth at bedtime.   Semaglutide (2 MG/DOSE) 8 MG/3ML Sopn Inject 2 mg into the skin every 7 (seven) days.   simvastatin 40 MG tablet Commonly known as: ZOCOR Take 20 mg by mouth at bedtime.   tamsulosin 0.4 MG Caps capsule Commonly known as: FLOMAX Take 0.4 mg by mouth daily after lunch.        Discharge Exam: Filed Weights   12/17/22 1106  Weight: 94.1 kg   General: Alert and oriented x 3, no acute distress Cardiovascular: Regular rate and rhythm, S1-S2 Lungs: Clear to auscultation bilaterally  Condition at discharge: good  The results of significant diagnostics from this hospitalization (including imaging, microbiology, ancillary and laboratory) are listed below for reference.   Imaging Studies: MR BRAIN WO CONTRAST  Result Date: 12/17/2022 CLINICAL DATA:  Altered  mental status.  Generalized weakness. EXAM: MRI HEAD WITHOUT CONTRAST TECHNIQUE: Multiplanar, multiecho pulse sequences of the brain and surrounding structures were obtained without intravenous contrast. COMPARISON:  CT Head 03/18/20 FINDINGS: Brain: No acute infarction, hydrocephalus, extra-axial collection or mass lesion. There is a small focus of microhemorrhage in the right cerebellar hemisphere (series 13, image 10). There is no evidence of surrounding cerebral edema. There are chronic infarcts in the bilateral cerebellar hemispheres. Unchanged likely chronic infarct in the left corona radiata. Vascular: Normal flow voids. Skull and upper cervical spine: Normal marrow signal. Sinuses/Orbits: Negative. Other: None IMPRESSION: 1. No acute intracranial abnormality. 2. Small focus of microhemorrhage in the right cerebellar hemisphere, likely related to  chronic hypertensive etiology. 3. Chronic infarcts in the bilateral cerebellar hemispheres and left corona radiata. Electronically Signed   By: Marin Roberts M.D.   On: 12/17/2022 15:57   DG Chest Portable 1 View  Result Date: 12/17/2022 CLINICAL DATA:  Altered mental status.  Diabetes. EXAM: PORTABLE CHEST 1 VIEW COMPARISON:  Chest 03/18/2020 FINDINGS: Postop CABG. Negative for heart failure. Mild left lower lobe airspace disease. Right lung clear. Negative for pleural effusion IMPRESSION: Mild left lower lobe atelectasis/infiltrate. Electronically Signed   By: Franchot Gallo M.D.   On: 12/17/2022 11:34   DG Foot Complete Right  Result Date: 11/23/2022 CLINICAL DATA:  High glucose recent toe removal EXAM: RIGHT FOOT COMPLETE - 3+ VIEW COMPARISON:  06/13/2022 FINDINGS: Patient is status post partial amputation of the first and second digits at the level of the proximal phalanges. Cut margins appear smooth. Mild degenerative change at the first MTP joint. Peripheral gas collection at the third digit. Limited evaluation of third digit distal phalanx due to flexed positioning. IMPRESSION: 1. Status post partial amputation of the first and second digits at the level of the proximal phalanges. Cut margins appear smooth and no definite acute osseous abnormality at these locations 2. Peripheral gas collection at the third digit could be due to bandage material or potential soft tissue ulcer, correlate with direct inspection. Electronically Signed   By: Donavan Foil M.D.   On: 11/23/2022 21:52    Microbiology: Results for orders placed or performed during the hospital encounter of 12/17/22  Culture, blood (Routine x 2)     Status: None (Preliminary result)   Collection Time: 12/17/22 11:22 AM   Specimen: BLOOD  Result Value Ref Range Status   Specimen Description BLOOD BLOOD RIGHT WRIST  Final   Special Requests   Final    BOTTLES DRAWN AEROBIC AND ANAEROBIC Blood Culture adequate volume   Culture    Final    NO GROWTH 2 DAYS Performed at [facility], [address], [place], [place] [address]    Report Status PENDING  Incomplete  Culture, blood (Routine x 2)     Status: None (Preliminary result)   Collection Time: 12/17/22 12:44 PM   Specimen: BLOOD  Result Value Ref Range Status   Specimen Description BLOOD BLOOD LEFT HAND  Final   Special Requests   Final    BOTTLES DRAWN AEROBIC AND ANAEROBIC Blood Culture results may not be optimal due to an inadequate volume of blood received in culture bottles   Culture   Final    NO GROWTH 2 DAYS Performed at [facility], [address], [place], [place] [address]    Report Status PENDING  Incomplete    Labs: CBC: Recent Labs  Lab 12/17/22 1121 12/18/22 0436  WBC 10.7* 8.7  NEUTROABS  8.8* 5.6  HGB 11.6* 10.1*  HCT 37.2* 31.5*  MCV 84.5 83.1  PLT 268 975   Basic Metabolic Panel: Recent Labs  Lab 12/17/22 1328 12/17/22 1659 12/17/22 2100 12/18/22 0436 12/19/22 0513  NA 130* 135 136 138 142  K 4.7 4.0 3.6 3.9 3.7  CL 96* 104 106 108 112*  CO2 17* 17* 20* 24 22  GLUCOSE 636* 283* 157* 153* 115*  BUN 45* 40* 37* 32* 25*  CREATININE 2.02* 1.72* 1.64* 1.45* 1.34*  CALCIUM 8.4* 8.6* 8.5* 8.5* 8.5*  MG  --   --   --  2.6*  --    Liver Function Tests: Recent Labs  Lab 12/17/22 1121 12/18/22 0436  AST 20 19  ALT 23 17  ALKPHOS 101 80  BILITOT 2.0* 0.9  PROT 6.7 5.7*  ALBUMIN 3.8 3.0*   CBG: Recent Labs  Lab 12/18/22 2052 12/18/22 2148 12/19/22 0535 12/19/22 0809 12/19/22 1122  GLUCAP 257* 276* 126* 154* 265*    Discharge time spent: less than 30 minutes.  Signed: Annita Brod, MD [facility] 12/19/2022

## 2022-12-19 NOTE — Plan of Care (Signed)

## 2022-12-22 LAB — CULTURE, BLOOD (ROUTINE X 2)
Culture: NO GROWTH
Culture: NO GROWTH
Special Requests: ADEQUATE

## 2023-01-06 ENCOUNTER — Ambulatory Visit: Payer: Self-pay | Admitting: Urology

## 2023-01-07 ENCOUNTER — Other Ambulatory Visit (INDEPENDENT_AMBULATORY_CARE_PROVIDER_SITE_OTHER): Payer: Self-pay | Admitting: Nurse Practitioner

## 2023-01-07 ENCOUNTER — Encounter: Payer: Self-pay | Admitting: Urology

## 2023-01-07 DIAGNOSIS — I7025 Atherosclerosis of native arteries of other extremities with ulceration: Secondary | ICD-10-CM

## 2023-01-14 ENCOUNTER — Ambulatory Visit (INDEPENDENT_AMBULATORY_CARE_PROVIDER_SITE_OTHER): Payer: Medicare Other | Admitting: Vascular Surgery

## 2023-01-14 ENCOUNTER — Encounter (INDEPENDENT_AMBULATORY_CARE_PROVIDER_SITE_OTHER): Payer: Medicare Other

## 2023-02-24 ENCOUNTER — Emergency Department: Payer: Medicare Other

## 2023-02-24 ENCOUNTER — Other Ambulatory Visit: Payer: Self-pay

## 2023-02-24 ENCOUNTER — Inpatient Hospital Stay (HOSPITAL_COMMUNITY): Payer: Medicare Other

## 2023-02-24 ENCOUNTER — Encounter (HOSPITAL_COMMUNITY): Payer: Self-pay

## 2023-02-24 ENCOUNTER — Inpatient Hospital Stay: Payer: Medicare Other

## 2023-02-24 ENCOUNTER — Encounter (HOSPITAL_COMMUNITY): Admission: EM | Disposition: E | Payer: Self-pay | Source: Other Acute Inpatient Hospital | Attending: Pulmonary Disease

## 2023-02-24 ENCOUNTER — Encounter (HOSPITAL_COMMUNITY): Payer: Self-pay | Admitting: Anesthesiology

## 2023-02-24 ENCOUNTER — Inpatient Hospital Stay
Admission: EM | Admit: 2023-02-24 | Discharge: 2023-02-24 | DRG: 637 | Disposition: A | Discharge status: Other Acute Inpatient Hospital | Payer: Medicare Other | Attending: Internal Medicine | Admitting: Internal Medicine

## 2023-02-24 ENCOUNTER — Emergency Department (HOSPITAL_COMMUNITY): Payer: Medicare Other

## 2023-02-24 ENCOUNTER — Inpatient Hospital Stay (HOSPITAL_COMMUNITY)
Admission: EM | Admit: 2023-02-24 | Discharge: 2023-03-29 | DRG: 023 | Disposition: E | Payer: Medicare Other | Source: Other Acute Inpatient Hospital | Attending: Pulmonary Disease | Admitting: Pulmonary Disease

## 2023-02-24 ENCOUNTER — Emergency Department (HOSPITAL_COMMUNITY): Payer: Medicare Other | Admitting: Anesthesiology

## 2023-02-24 ENCOUNTER — Inpatient Hospital Stay
Admit: 2023-02-24 | Discharge: 2023-02-24 | Disposition: A | Discharge status: Home / Self Care | Payer: Medicare Other | Attending: Internal Medicine | Admitting: Internal Medicine

## 2023-02-24 ENCOUNTER — Inpatient Hospital Stay (HOSPITAL_COMMUNITY): Admission: EM | Admit: 2023-02-24 | Payer: Medicare Other | Source: Ambulatory Visit | Admitting: Neurology

## 2023-02-24 DIAGNOSIS — R58 Hemorrhage, not elsewhere classified: Secondary | ICD-10-CM | POA: Diagnosis not present

## 2023-02-24 DIAGNOSIS — E875 Hyperkalemia: Secondary | ICD-10-CM | POA: Diagnosis present

## 2023-02-24 DIAGNOSIS — R29703 NIHSS score 3: Secondary | ICD-10-CM | POA: Diagnosis present

## 2023-02-24 DIAGNOSIS — I480 Paroxysmal atrial fibrillation: Secondary | ICD-10-CM | POA: Diagnosis not present

## 2023-02-24 DIAGNOSIS — I2510 Atherosclerotic heart disease of native coronary artery without angina pectoris: Secondary | ICD-10-CM | POA: Diagnosis present

## 2023-02-24 DIAGNOSIS — Z951 Presence of aortocoronary bypass graft: Secondary | ICD-10-CM | POA: Diagnosis not present

## 2023-02-24 DIAGNOSIS — I4891 Unspecified atrial fibrillation: Secondary | ICD-10-CM | POA: Diagnosis present

## 2023-02-24 DIAGNOSIS — I6601 Occlusion and stenosis of right middle cerebral artery: Secondary | ICD-10-CM | POA: Diagnosis present

## 2023-02-24 DIAGNOSIS — G9341 Metabolic encephalopathy: Secondary | ICD-10-CM | POA: Diagnosis present

## 2023-02-24 DIAGNOSIS — I6502 Occlusion and stenosis of left vertebral artery: Secondary | ICD-10-CM | POA: Diagnosis present

## 2023-02-24 DIAGNOSIS — E1110 Type 2 diabetes mellitus with ketoacidosis without coma: Principal | ICD-10-CM | POA: Diagnosis present

## 2023-02-24 DIAGNOSIS — I252 Old myocardial infarction: Secondary | ICD-10-CM | POA: Diagnosis not present

## 2023-02-24 DIAGNOSIS — R1310 Dysphagia, unspecified: Secondary | ICD-10-CM | POA: Diagnosis present

## 2023-02-24 DIAGNOSIS — N179 Acute kidney failure, unspecified: Secondary | ICD-10-CM | POA: Diagnosis present

## 2023-02-24 DIAGNOSIS — I509 Heart failure, unspecified: Secondary | ICD-10-CM | POA: Diagnosis not present

## 2023-02-24 DIAGNOSIS — R4701 Aphasia: Secondary | ICD-10-CM | POA: Diagnosis present

## 2023-02-24 DIAGNOSIS — I130 Hypertensive heart and chronic kidney disease with heart failure and stage 1 through stage 4 chronic kidney disease, or unspecified chronic kidney disease: Secondary | ICD-10-CM | POA: Diagnosis present

## 2023-02-24 DIAGNOSIS — F32A Depression, unspecified: Secondary | ICD-10-CM | POA: Diagnosis present

## 2023-02-24 DIAGNOSIS — D649 Anemia, unspecified: Secondary | ICD-10-CM

## 2023-02-24 DIAGNOSIS — Z79899 Other long term (current) drug therapy: Secondary | ICD-10-CM

## 2023-02-24 DIAGNOSIS — E871 Hypo-osmolality and hyponatremia: Secondary | ICD-10-CM | POA: Diagnosis present

## 2023-02-24 DIAGNOSIS — I214 Non-ST elevation (NSTEMI) myocardial infarction: Secondary | ICD-10-CM

## 2023-02-24 DIAGNOSIS — E785 Hyperlipidemia, unspecified: Secondary | ICD-10-CM | POA: Diagnosis present

## 2023-02-24 DIAGNOSIS — K683 Retroperitoneal hematoma: Secondary | ICD-10-CM | POA: Diagnosis present

## 2023-02-24 DIAGNOSIS — I639 Cerebral infarction, unspecified: Principal | ICD-10-CM | POA: Diagnosis present

## 2023-02-24 DIAGNOSIS — J9601 Acute respiratory failure with hypoxia: Secondary | ICD-10-CM | POA: Diagnosis present

## 2023-02-24 DIAGNOSIS — I651 Occlusion and stenosis of basilar artery: Secondary | ICD-10-CM | POA: Diagnosis present

## 2023-02-24 DIAGNOSIS — K5900 Constipation, unspecified: Secondary | ICD-10-CM | POA: Diagnosis present

## 2023-02-24 DIAGNOSIS — Z6837 Body mass index (BMI) 37.0-37.9, adult: Secondary | ICD-10-CM | POA: Diagnosis not present

## 2023-02-24 DIAGNOSIS — I6509 Occlusion and stenosis of unspecified vertebral artery: Secondary | ICD-10-CM | POA: Diagnosis present

## 2023-02-24 DIAGNOSIS — I10 Essential (primary) hypertension: Secondary | ICD-10-CM | POA: Diagnosis not present

## 2023-02-24 DIAGNOSIS — Z7901 Long term (current) use of anticoagulants: Secondary | ICD-10-CM

## 2023-02-24 DIAGNOSIS — E669 Obesity, unspecified: Secondary | ICD-10-CM | POA: Diagnosis present

## 2023-02-24 DIAGNOSIS — Z515 Encounter for palliative care: Secondary | ICD-10-CM

## 2023-02-24 DIAGNOSIS — I63532 Cerebral infarction due to unspecified occlusion or stenosis of left posterior cerebral artery: Secondary | ICD-10-CM

## 2023-02-24 DIAGNOSIS — R54 Age-related physical debility: Secondary | ICD-10-CM | POA: Diagnosis present

## 2023-02-24 DIAGNOSIS — I63432 Cerebral infarction due to embolism of left posterior cerebral artery: Secondary | ICD-10-CM | POA: Diagnosis not present

## 2023-02-24 DIAGNOSIS — E1122 Type 2 diabetes mellitus with diabetic chronic kidney disease: Secondary | ICD-10-CM | POA: Diagnosis present

## 2023-02-24 DIAGNOSIS — I110 Hypertensive heart disease with heart failure: Secondary | ICD-10-CM

## 2023-02-24 DIAGNOSIS — Z7984 Long term (current) use of oral hypoglycemic drugs: Secondary | ICD-10-CM

## 2023-02-24 DIAGNOSIS — I5033 Acute on chronic diastolic (congestive) heart failure: Secondary | ICD-10-CM | POA: Diagnosis present

## 2023-02-24 DIAGNOSIS — Z7902 Long term (current) use of antithrombotics/antiplatelets: Secondary | ICD-10-CM

## 2023-02-24 DIAGNOSIS — K661 Hemoperitoneum: Secondary | ICD-10-CM | POA: Diagnosis present

## 2023-02-24 DIAGNOSIS — I160 Hypertensive urgency: Secondary | ICD-10-CM | POA: Diagnosis present

## 2023-02-24 DIAGNOSIS — Z66 Do not resuscitate: Secondary | ICD-10-CM | POA: Diagnosis not present

## 2023-02-24 DIAGNOSIS — Z89421 Acquired absence of other right toe(s): Secondary | ICD-10-CM

## 2023-02-24 DIAGNOSIS — N400 Enlarged prostate without lower urinary tract symptoms: Secondary | ICD-10-CM | POA: Diagnosis present

## 2023-02-24 DIAGNOSIS — Y92009 Unspecified place in unspecified non-institutional (private) residence as the place of occurrence of the external cause: Secondary | ICD-10-CM

## 2023-02-24 DIAGNOSIS — Z7189 Other specified counseling: Secondary | ICD-10-CM | POA: Diagnosis not present

## 2023-02-24 DIAGNOSIS — D62 Acute posthemorrhagic anemia: Secondary | ICD-10-CM | POA: Diagnosis present

## 2023-02-24 DIAGNOSIS — Z1152 Encounter for screening for COVID-19: Secondary | ICD-10-CM

## 2023-02-24 DIAGNOSIS — W19XXXA Unspecified fall, initial encounter: Secondary | ICD-10-CM | POA: Diagnosis present

## 2023-02-24 DIAGNOSIS — Z833 Family history of diabetes mellitus: Secondary | ICD-10-CM

## 2023-02-24 DIAGNOSIS — Z8673 Personal history of transient ischemic attack (TIA), and cerebral infarction without residual deficits: Secondary | ICD-10-CM

## 2023-02-24 DIAGNOSIS — H5347 Heteronymous bilateral field defects: Secondary | ICD-10-CM | POA: Diagnosis not present

## 2023-02-24 DIAGNOSIS — Z794 Long term (current) use of insulin: Secondary | ICD-10-CM

## 2023-02-24 DIAGNOSIS — N182 Chronic kidney disease, stage 2 (mild): Secondary | ICD-10-CM | POA: Diagnosis present

## 2023-02-24 DIAGNOSIS — Z87891 Personal history of nicotine dependence: Secondary | ICD-10-CM | POA: Diagnosis not present

## 2023-02-24 DIAGNOSIS — I63533 Cerebral infarction due to unspecified occlusion or stenosis of bilateral posterior cerebral arteries: Secondary | ICD-10-CM | POA: Diagnosis not present

## 2023-02-24 DIAGNOSIS — I4892 Unspecified atrial flutter: Secondary | ICD-10-CM | POA: Diagnosis present

## 2023-02-24 DIAGNOSIS — Z9181 History of falling: Secondary | ICD-10-CM | POA: Diagnosis not present

## 2023-02-24 DIAGNOSIS — R4182 Altered mental status, unspecified: Secondary | ICD-10-CM | POA: Diagnosis not present

## 2023-02-24 DIAGNOSIS — G8194 Hemiplegia, unspecified affecting left nondominant side: Secondary | ICD-10-CM | POA: Diagnosis present

## 2023-02-24 DIAGNOSIS — I7389 Other specified peripheral vascular diseases: Secondary | ICD-10-CM | POA: Diagnosis not present

## 2023-02-24 DIAGNOSIS — S2241XA Multiple fractures of ribs, right side, initial encounter for closed fracture: Secondary | ICD-10-CM | POA: Diagnosis present

## 2023-02-24 DIAGNOSIS — F4310 Post-traumatic stress disorder, unspecified: Secondary | ICD-10-CM | POA: Diagnosis present

## 2023-02-24 DIAGNOSIS — G2581 Restless legs syndrome: Secondary | ICD-10-CM | POA: Diagnosis present

## 2023-02-24 DIAGNOSIS — I63212 Cerebral infarction due to unspecified occlusion or stenosis of left vertebral arteries: Secondary | ICD-10-CM | POA: Diagnosis not present

## 2023-02-24 DIAGNOSIS — Z8249 Family history of ischemic heart disease and other diseases of the circulatory system: Secondary | ICD-10-CM

## 2023-02-24 DIAGNOSIS — Z7985 Long-term (current) use of injectable non-insulin antidiabetic drugs: Secondary | ICD-10-CM

## 2023-02-24 DIAGNOSIS — I6522 Occlusion and stenosis of left carotid artery: Secondary | ICD-10-CM | POA: Diagnosis present

## 2023-02-24 DIAGNOSIS — Z91148 Patient's other noncompliance with medication regimen for other reason: Secondary | ICD-10-CM

## 2023-02-24 DIAGNOSIS — J9602 Acute respiratory failure with hypercapnia: Secondary | ICD-10-CM | POA: Diagnosis not present

## 2023-02-24 DIAGNOSIS — D72829 Elevated white blood cell count, unspecified: Secondary | ICD-10-CM

## 2023-02-24 DIAGNOSIS — R7989 Other specified abnormal findings of blood chemistry: Secondary | ICD-10-CM

## 2023-02-24 DIAGNOSIS — I6389 Other cerebral infarction: Secondary | ICD-10-CM | POA: Diagnosis not present

## 2023-02-24 DIAGNOSIS — J9622 Acute and chronic respiratory failure with hypercapnia: Secondary | ICD-10-CM | POA: Diagnosis not present

## 2023-02-24 DIAGNOSIS — R338 Other retention of urine: Secondary | ICD-10-CM | POA: Diagnosis present

## 2023-02-24 DIAGNOSIS — I771 Stricture of artery: Secondary | ICD-10-CM | POA: Diagnosis present

## 2023-02-24 DIAGNOSIS — I63542 Cerebral infarction due to unspecified occlusion or stenosis of left cerebellar artery: Secondary | ICD-10-CM | POA: Diagnosis present

## 2023-02-24 DIAGNOSIS — S37011A Minor contusion of right kidney, initial encounter: Secondary | ICD-10-CM | POA: Diagnosis present

## 2023-02-24 DIAGNOSIS — E1140 Type 2 diabetes mellitus with diabetic neuropathy, unspecified: Secondary | ICD-10-CM | POA: Diagnosis present

## 2023-02-24 DIAGNOSIS — D696 Thrombocytopenia, unspecified: Secondary | ICD-10-CM | POA: Diagnosis not present

## 2023-02-24 DIAGNOSIS — R34 Anuria and oliguria: Secondary | ICD-10-CM | POA: Diagnosis not present

## 2023-02-24 DIAGNOSIS — R29733 NIHSS score 33: Secondary | ICD-10-CM | POA: Diagnosis present

## 2023-02-24 DIAGNOSIS — Z6835 Body mass index (BMI) 35.0-35.9, adult: Secondary | ICD-10-CM

## 2023-02-24 DIAGNOSIS — I6312 Cerebral infarction due to embolism of basilar artery: Secondary | ICD-10-CM | POA: Diagnosis not present

## 2023-02-24 HISTORY — PX: RADIOLOGY WITH ANESTHESIA: SHX6223

## 2023-02-24 HISTORY — PX: IR PERCUTANEOUS ART THROMBECTOMY/INFUSION INTRACRANIAL INC DIAG ANGIO: IMG6087

## 2023-02-24 HISTORY — PX: IR CT HEAD LTD: IMG2386

## 2023-02-24 LAB — ETHANOL: Alcohol, Ethyl (B): <10 mg/dL (ref <10)

## 2023-02-24 LAB — COMPREHENSIVE METABOLIC PANEL
ALT: 20 U/L (ref 0–44)
AST: 27 U/L (ref 15–41)
Albumin: 3.3 g/dL — ABNORMAL LOW (ref 3.5–5.0)
Alkaline Phosphatase: 77 U/L (ref 38–126)
Anion gap: 14 (ref 5–15)
BUN: 70 mg/dL — ABNORMAL HIGH (ref 8–23)
CO2: 16 mmol/L — ABNORMAL LOW (ref 22–32)
Calcium: 7.6 mg/dL — ABNORMAL LOW (ref 8.9–10.3)
Chloride: 92 mmol/L — ABNORMAL LOW (ref 98–111)
Creatinine, Ser: 2.97 mg/dL — ABNORMAL HIGH (ref 0.61–1.24)
GFR, Estimated: 21 mL/min — ABNORMAL LOW (ref >60)
Glucose, Bld: 787 mg/dL (ref 70–99)
Potassium: 6.3 mmol/L (ref 3.5–5.1)
Sodium: 122 mmol/L — ABNORMAL LOW (ref 135–145)
Total Bilirubin: 1.5 mg/dL — ABNORMAL HIGH (ref 0.3–1.2)
Total Protein: 6.5 g/dL (ref 6.5–8.1)

## 2023-02-24 LAB — URINE DRUG SCREEN, QUALITATIVE (ARMC ONLY)
Amphetamines, Ur Screen: NOT DETECTED
Barbiturates, Ur Screen: NOT DETECTED
Benzodiazepine, Ur Scrn: NOT DETECTED
Cannabinoid 50 Ng, Ur ~~LOC~~: NOT DETECTED
Cocaine Metabolite,Ur ~~LOC~~: NOT DETECTED
MDMA (Ecstasy)Ur Screen: NOT DETECTED
Methadone Scn, Ur: NOT DETECTED
Opiate, Ur Screen: NOT DETECTED
Phencyclidine (PCP) Ur S: NOT DETECTED
Tricyclic, Ur Screen: POSITIVE — AB

## 2023-02-24 LAB — I-STAT ARTERIAL BLOOD GAS, ED
Acid-base deficit: 9 mmol/L — ABNORMAL HIGH (ref 0.0–2.0)
Acid-base deficit: 10 mmol/L — ABNORMAL HIGH (ref 0.0–2.0)
Bicarbonate: 17.3 mmol/L — ABNORMAL LOW (ref 20.0–28.0)
Bicarbonate: 17.6 mmol/L — ABNORMAL LOW (ref 20.0–28.0)
Calcium, Ion: 1.1 mmol/L — ABNORMAL LOW (ref 1.15–1.40)
Calcium, Ion: 1.1 mmol/L — ABNORMAL LOW (ref 1.15–1.40)
HCT: 16 % — ABNORMAL LOW (ref 39.0–52.0)
HCT: 23 % — ABNORMAL LOW (ref 39.0–52.0)
Hemoglobin: 5.4 g/dL — CL (ref 13.0–17.0)
Hemoglobin: 7.8 g/dL — ABNORMAL LOW (ref 13.0–17.0)
O2 Saturation: 99 %
O2 Saturation: 92 %
Patient temperature: 36
Patient temperature: 36
Potassium: 4.9 mmol/L (ref 3.5–5.1)
Potassium: 4.8 mmol/L (ref 3.5–5.1)
Sodium: 128 mmol/L — ABNORMAL LOW (ref 135–145)
Sodium: 133 mmol/L — ABNORMAL LOW (ref 135–145)
TCO2: 18 mmol/L — ABNORMAL LOW (ref 22–32)
TCO2: 19 mmol/L — ABNORMAL LOW (ref 22–32)
pCO2 arterial: 38.3 mmHg (ref 32–48)
pCO2 arterial: 47.3 mmHg (ref 32–48)
pH, Arterial: 7.257 — ABNORMAL LOW (ref 7.35–7.45)
pH, Arterial: 7.173 — CL (ref 7.35–7.45)
pO2, Arterial: 186 mmHg — ABNORMAL HIGH (ref 83–108)
pO2, Arterial: 75 mmHg — ABNORMAL LOW (ref 83–108)

## 2023-02-24 LAB — POCT I-STAT 7, (LYTES, BLD GAS, ICA,H+H)
Acid-base deficit: 7 mmol/L — ABNORMAL HIGH (ref 0.0–2.0)
Bicarbonate: 19.3 mmol/L — ABNORMAL LOW (ref 20.0–28.0)
Calcium, Ion: 1.11 mmol/L — ABNORMAL LOW (ref 1.15–1.40)
HCT: 24 % — ABNORMAL LOW (ref 39.0–52.0)
Hemoglobin: 8.2 g/dL — ABNORMAL LOW (ref 13.0–17.0)
O2 Saturation: 97 %
Patient temperature: 96.7
Potassium: 4.5 mmol/L (ref 3.5–5.1)
Sodium: 133 mmol/L — ABNORMAL LOW (ref 135–145)
TCO2: 21 mmol/L — ABNORMAL LOW (ref 22–32)
pCO2 arterial: 41.7 mmHg (ref 32–48)
pH, Arterial: 7.267 — ABNORMAL LOW (ref 7.35–7.45)
pO2, Arterial: 103 mmHg (ref 83–108)

## 2023-02-24 LAB — CBC WITH DIFFERENTIAL/PLATELET
Abs Immature Granulocytes: 0.19 10*3/uL — ABNORMAL HIGH (ref 0.00–0.07)
Basophils Absolute: 0 10*3/uL (ref 0.0–0.1)
Basophils Relative: 0 %
Eosinophils Absolute: 0.1 10*3/uL (ref 0.0–0.5)
Eosinophils Relative: 0 %
HCT: 20.4 % — ABNORMAL LOW (ref 39.0–52.0)
Hemoglobin: 6.6 g/dL — ABNORMAL LOW (ref 13.0–17.0)
Immature Granulocytes: 1 %
Lymphocytes Relative: 3 %
Lymphs Abs: 0.6 10*3/uL — ABNORMAL LOW (ref 0.7–4.0)
MCH: 28 pg (ref 26.0–34.0)
MCHC: 32.4 g/dL (ref 30.0–36.0)
MCV: 86.4 fL (ref 80.0–100.0)
Monocytes Absolute: 1.6 10*3/uL — ABNORMAL HIGH (ref 0.1–1.0)
Monocytes Relative: 9 %
Neutro Abs: 15.1 10*3/uL — ABNORMAL HIGH (ref 1.7–7.7)
Neutrophils Relative %: 87 %
Platelets: 171 10*3/uL (ref 150–400)
RBC: 2.36 MIL/uL — ABNORMAL LOW (ref 4.22–5.81)
RDW: 18.6 % — ABNORMAL HIGH (ref 11.5–15.5)
WBC: 17.6 10*3/uL — ABNORMAL HIGH (ref 4.0–10.5)
nRBC: 0 % (ref 0.0–0.2)

## 2023-02-24 LAB — CBG MONITORING, ED
Glucose-Capillary: >600 mg/dL (ref 70–99)
Glucose-Capillary: 529 mg/dL (ref 70–99)
Glucose-Capillary: 427 mg/dL — ABNORMAL HIGH (ref 70–99)
Glucose-Capillary: 491 mg/dL — ABNORMAL HIGH (ref 70–99)
Glucose-Capillary: 312 mg/dL — ABNORMAL HIGH (ref 70–99)
Glucose-Capillary: 302 mg/dL — ABNORMAL HIGH (ref 70–99)
Glucose-Capillary: 296 mg/dL — ABNORMAL HIGH (ref 70–99)

## 2023-02-24 LAB — BASIC METABOLIC PANEL
Anion gap: 14 (ref 5–15)
Anion gap: 12 (ref 5–15)
BUN: 72 mg/dL — ABNORMAL HIGH (ref 8–23)
BUN: 71 mg/dL — ABNORMAL HIGH (ref 8–23)
CO2: 17 mmol/L — ABNORMAL LOW (ref 22–32)
CO2: 19 mmol/L — ABNORMAL LOW (ref 22–32)
Calcium: 7.8 mg/dL — ABNORMAL LOW (ref 8.9–10.3)
Calcium: 7.9 mg/dL — ABNORMAL LOW (ref 8.9–10.3)
Chloride: 95 mmol/L — ABNORMAL LOW (ref 98–111)
Chloride: 98 mmol/L (ref 98–111)
Creatinine, Ser: 2.96 mg/dL — ABNORMAL HIGH (ref 0.61–1.24)
Creatinine, Ser: 3.02 mg/dL — ABNORMAL HIGH (ref 0.61–1.24)
GFR, Estimated: 21 mL/min — ABNORMAL LOW (ref >60)
GFR, Estimated: 20 mL/min — ABNORMAL LOW (ref >60)
Glucose, Bld: 732 mg/dL (ref 70–99)
Glucose, Bld: 304 mg/dL — ABNORMAL HIGH (ref 70–99)
Potassium: 5.6 mmol/L — ABNORMAL HIGH (ref 3.5–5.1)
Potassium: 4.7 mmol/L (ref 3.5–5.1)
Sodium: 126 mmol/L — ABNORMAL LOW (ref 135–145)
Sodium: 129 mmol/L — ABNORMAL LOW (ref 135–145)

## 2023-02-24 LAB — I-STAT CHEM 8, ED
BUN: 66 mg/dL — ABNORMAL HIGH (ref 8–23)
Calcium, Ion: 1.12 mmol/L — ABNORMAL LOW (ref 1.15–1.40)
Chloride: 97 mmol/L — ABNORMAL LOW (ref 98–111)
Creatinine, Ser: 2.7 mg/dL — ABNORMAL HIGH (ref 0.61–1.24)
Glucose, Bld: 311 mg/dL — ABNORMAL HIGH (ref 70–99)
HCT: 15 % — ABNORMAL LOW (ref 39.0–52.0)
Hemoglobin: 5.1 g/dL — CL (ref 13.0–17.0)
Potassium: 4.9 mmol/L (ref 3.5–5.1)
Sodium: 128 mmol/L — ABNORMAL LOW (ref 135–145)
TCO2: 20 mmol/L — ABNORMAL LOW (ref 22–32)

## 2023-02-24 LAB — PROTIME-INR
INR: 1.5 — ABNORMAL HIGH (ref 0.8–1.2)
Prothrombin Time: 18.2 seconds — ABNORMAL HIGH (ref 11.4–15.2)

## 2023-02-24 LAB — LACTIC ACID, PLASMA
Lactic Acid, Venous: 2.7 mmol/L (ref 0.5–1.9)
Lactic Acid, Venous: 2 mmol/L (ref 0.5–1.9)

## 2023-02-24 LAB — APTT: aPTT: 36 seconds (ref 24–36)

## 2023-02-24 LAB — URINALYSIS, ROUTINE W REFLEX MICROSCOPIC
Bacteria, UA: NONE SEEN
Bilirubin Urine: NEGATIVE
Glucose, UA: >=500 mg/dL — AB
Hgb urine dipstick: NEGATIVE
Ketones, ur: 5 mg/dL — AB
Leukocytes,Ua: NEGATIVE
Nitrite: NEGATIVE
Protein, ur: 30 mg/dL — AB
Specific Gravity, Urine: 1.018 (ref 1.005–1.030)
pH: 5 (ref 5.0–8.0)

## 2023-02-24 LAB — CBC
HCT: 27 % — ABNORMAL LOW (ref 39.0–52.0)
Hemoglobin: 9.1 g/dL — ABNORMAL LOW (ref 13.0–17.0)
MCH: 29.4 pg (ref 26.0–34.0)
MCHC: 33.7 g/dL (ref 30.0–36.0)
MCV: 87.1 fL (ref 80.0–100.0)
Platelets: 164 10*3/uL (ref 150–400)
RBC: 3.1 MIL/uL — ABNORMAL LOW (ref 4.22–5.81)
RDW: 17.1 % — ABNORMAL HIGH (ref 11.5–15.5)
WBC: 15.8 10*3/uL — ABNORMAL HIGH (ref 4.0–10.5)
nRBC: 0 % (ref 0.0–0.2)

## 2023-02-24 LAB — RESP PANEL BY RT-PCR (RSV, FLU A&B, COVID)  RVPGX2
Influenza A by PCR: NEGATIVE
Influenza B by PCR: NEGATIVE
Resp Syncytial Virus by PCR: NEGATIVE
SARS Coronavirus 2 by RT PCR: NEGATIVE

## 2023-02-24 LAB — BLOOD GAS, VENOUS
Acid-base deficit: 8.1 mmol/L — ABNORMAL HIGH (ref 0.0–2.0)
Bicarbonate: 17 mmol/L — ABNORMAL LOW (ref 20.0–28.0)
O2 Saturation: 36.7 %
Patient temperature: 37
pCO2, Ven: 33 mmHg — ABNORMAL LOW (ref 44–60)
pH, Ven: 7.32 (ref 7.25–7.43)
pO2, Ven: <31 mmHg — CL (ref 32–45)

## 2023-02-24 LAB — TROPONIN I (HIGH SENSITIVITY): Troponin I (High Sensitivity): 2280 ng/L (ref <18)

## 2023-02-24 LAB — PREPARE RBC (CROSSMATCH)

## 2023-02-24 LAB — GLUCOSE, CAPILLARY: Glucose-Capillary: 278 mg/dL — ABNORMAL HIGH (ref 70–99)

## 2023-02-24 LAB — BRAIN NATRIURETIC PEPTIDE: B Natriuretic Peptide: 685.7 pg/mL — ABNORMAL HIGH (ref 0.0–100.0)

## 2023-02-24 LAB — ABO/RH: ABO/RH(D): A POS

## 2023-02-24 LAB — BETA-HYDROXYBUTYRIC ACID
Beta-Hydroxybutyric Acid: 3.77 mmol/L — ABNORMAL HIGH (ref 0.05–0.27)
Beta-Hydroxybutyric Acid: 0.74 mmol/L — ABNORMAL HIGH (ref 0.05–0.27)

## 2023-02-24 SURGERY — Surgical Case
Anesthesia: *Unknown

## 2023-02-24 SURGERY — IR WITH ANESTHESIA
Anesthesia: Choice

## 2023-02-24 MED ORDER — EPTIFIBATIDE 20 MG/10ML IV SOLN
INTRAVENOUS | Status: AC | PRN
  Administered 2023-02-24 (×4): 2 mg via INTRA_ARTERIAL

## 2023-02-24 MED ORDER — LACTATED RINGERS IV BOLUS
1000.0000 mL | Freq: Once | INTRAVENOUS | Status: AC
  Administered 2023-02-24: 1000 mL via INTRAVENOUS

## 2023-02-24 MED ORDER — IOHEXOL 350 MG/ML SOLN
100.0000 mL | Freq: Once | INTRAVENOUS | Status: AC | PRN
  Administered 2023-02-24: 100 mL via INTRAVENOUS

## 2023-02-24 MED ORDER — LIDOCAINE HCL (CARDIAC) PF 100 MG/5ML IV SOSY
PREFILLED_SYRINGE | INTRAVENOUS | Status: DC | PRN
  Administered 2023-02-24: 60 mg via INTRATRACHEAL

## 2023-02-24 MED ORDER — CALCIUM GLUCONATE-NACL 1-0.675 GM/50ML-% IV SOLN
1.0000 g | Freq: Once | INTRAVENOUS | Status: AC
  Administered 2023-02-24: 1000 mg via INTRAVENOUS
  Filled 2023-02-24: qty 50

## 2023-02-24 MED ORDER — LACTATED RINGERS IV SOLN: INTRAVENOUS | Status: DC

## 2023-02-24 MED ORDER — CEFAZOLIN SODIUM-DEXTROSE 2-3 GM-%(50ML) IV SOLR
INTRAVENOUS | Status: DC | PRN
  Administered 2023-02-24: 2 g via INTRAVENOUS

## 2023-02-24 MED ORDER — DEXTROSE 50 % IV SOLN: 0.0000 mL | INTRAVENOUS | Status: DC | PRN

## 2023-02-24 MED ORDER — SODIUM CHLORIDE 0.9 % IV SOLN: 10.0000 mL/h | Freq: Once | INTRAVENOUS | Status: DC

## 2023-02-24 MED ORDER — ACETAMINOPHEN 650 MG RE SUPP: 650.0000 mg | RECTAL | Status: DC | PRN

## 2023-02-24 MED ORDER — ORAL CARE MOUTH RINSE
15.0000 mL | OROMUCOSAL | Status: DC
  Administered 2023-02-25 – 2023-03-01 (×56): 15 mL via OROMUCOSAL

## 2023-02-24 MED ORDER — PANTOPRAZOLE SODIUM 40 MG IV SOLR: 40.0000 mg | Freq: Two times a day (BID) | INTRAVENOUS | Status: DC

## 2023-02-24 MED ORDER — ASPIRIN 81 MG PO CHEW
324.0000 mg | CHEWABLE_TABLET | Freq: Once | ORAL | Status: AC
  Administered 2023-02-24: 324 mg via ORAL
  Filled 2023-02-24: qty 4

## 2023-02-24 MED ORDER — SODIUM CHLORIDE (PF) 0.9 % IJ SOLN
INTRAVENOUS | Status: AC | PRN
  Administered 2023-02-24: 25 ug via INTRA_ARTERIAL

## 2023-02-24 MED ORDER — PROPOFOL 10 MG/ML IV BOLUS
INTRAVENOUS | Status: DC | PRN
  Administered 2023-02-24: 100 mg via INTRAVENOUS

## 2023-02-24 MED ORDER — INSULIN REGULAR(HUMAN) IN NACL 100-0.9 UT/100ML-% IV SOLN
INTRAVENOUS | Status: DC
  Administered 2023-02-24: 11.5 [IU]/h via INTRAVENOUS
  Filled 2023-02-24: qty 100

## 2023-02-24 MED ORDER — VANCOMYCIN HCL 2000 MG/400ML IV SOLN
2000.0000 mg | Freq: Once | INTRAVENOUS | Status: AC
  Administered 2023-02-24: 2000 mg via INTRAVENOUS
  Filled 2023-02-24: qty 400

## 2023-02-24 MED ORDER — SODIUM CHLORIDE 0.9 % IV SOLN: INTRAVENOUS | Status: DC | PRN

## 2023-02-24 MED ORDER — METRONIDAZOLE 500 MG/100ML IV SOLN
500.0000 mg | Freq: Once | INTRAVENOUS | Status: AC
  Administered 2023-02-24: 500 mg via INTRAVENOUS
  Filled 2023-02-24: qty 100

## 2023-02-24 MED ORDER — ROCURONIUM BROMIDE 100 MG/10ML IV SOLN
INTRAVENOUS | Status: DC | PRN
  Administered 2023-02-24: 40 mg via INTRAVENOUS
  Administered 2023-02-24: 50 mg via INTRAVENOUS
  Administered 2023-02-24: 60 mg via INTRAVENOUS

## 2023-02-24 MED ORDER — ASPIRIN 81 MG PO CHEW
81.0000 mg | CHEWABLE_TABLET | Freq: Every day | ORAL | Status: DC
  Administered 2023-02-25 – 2023-02-27 (×3): 81 mg
  Filled 2023-02-24 (×3): qty 1

## 2023-02-24 MED ORDER — IOHEXOL 300 MG/ML  SOLN
150.0000 mL | Freq: Once | INTRAMUSCULAR | Status: AC | PRN
  Administered 2023-02-24: 100 mL via INTRA_ARTERIAL

## 2023-02-24 MED ORDER — SODIUM CHLORIDE 0.9 % IV BOLUS
500.0000 mL | Freq: Once | INTRAVENOUS | Status: AC
  Administered 2023-02-24: 500 mL via INTRAVENOUS

## 2023-02-24 MED ORDER — DEXTROSE IN LACTATED RINGERS 5 % IV SOLN: INTRAVENOUS | Status: DC

## 2023-02-24 MED ORDER — SODIUM CHLORIDE 0.9 % IV SOLN: INTRAVENOUS | Status: DC

## 2023-02-24 MED ORDER — PHENYLEPHRINE 80 MCG/ML (10ML) SYRINGE FOR IV PUSH (FOR BLOOD PRESSURE SUPPORT)
PREFILLED_SYRINGE | INTRAVENOUS | Status: DC | PRN
  Administered 2023-02-24: 120 ug via INTRAVENOUS
  Administered 2023-02-24: 80 ug via INTRAVENOUS

## 2023-02-24 MED ORDER — LACTATED RINGERS IV BOLUS
1000.0000 mL | Freq: Once | INTRAVENOUS | Status: AC
  Administered 2023-02-25: 1000 mL via INTRAVENOUS

## 2023-02-24 MED ORDER — IOHEXOL 300 MG/ML  SOLN
100.0000 mL | Freq: Once | INTRAMUSCULAR | Status: AC | PRN
  Administered 2023-02-24: 20 mL via INTRA_ARTERIAL

## 2023-02-24 MED ORDER — PROPOFOL 500 MG/50ML IV EMUL
INTRAVENOUS | Status: DC | PRN
  Administered 2023-02-24: 40 ug/kg/min via INTRAVENOUS

## 2023-02-24 MED ORDER — CHLORHEXIDINE GLUCONATE CLOTH 2 % EX PADS
6.0000 | MEDICATED_PAD | Freq: Every day | CUTANEOUS | Status: DC
  Administered 2023-02-24 – 2023-03-01 (×5): 6 via TOPICAL

## 2023-02-24 MED ORDER — CLEVIDIPINE BUTYRATE 0.5 MG/ML IV EMUL
0.0000 mg/h | INTRAVENOUS | Status: DC
  Administered 2023-02-24: 4 mg/h via INTRAVENOUS
  Administered 2023-02-25: 12 mg/h via INTRAVENOUS
  Administered 2023-02-25: 20 mg/h via INTRAVENOUS
  Administered 2023-02-25: 21 mg/h via INTRAVENOUS
  Administered 2023-02-25: 20 mg/h via INTRAVENOUS
  Administered 2023-02-25: 14 mg/h via INTRAVENOUS
  Administered 2023-02-25: 6 mg/h via INTRAVENOUS
  Administered 2023-02-25: 14 mg/h via INTRAVENOUS
  Administered 2023-02-25: 21 mg/h via INTRAVENOUS
  Filled 2023-02-24 (×3): qty 100
  Filled 2023-02-24 (×2): qty 50
  Filled 2023-02-24 (×3): qty 100

## 2023-02-24 MED ORDER — PHENYLEPHRINE HCL-NACL 20-0.9 MG/250ML-% IV SOLN
INTRAVENOUS | Status: DC | PRN
  Administered 2023-02-24: 40 ug/min via INTRAVENOUS

## 2023-02-24 MED ORDER — VANCOMYCIN HCL IN DEXTROSE 1-5 GM/200ML-% IV SOLN: 1000.0000 mg | Freq: Once | INTRAVENOUS | Status: DC

## 2023-02-24 MED ORDER — SODIUM BICARBONATE 8.4 % IV SOLN
INTRAVENOUS | Status: DC | PRN
  Administered 2023-02-24: 50 meq via INTRAVENOUS

## 2023-02-24 MED ORDER — INSULIN REGULAR(HUMAN) IN NACL 100-0.9 UT/100ML-% IV SOLN
INTRAVENOUS | Status: AC
  Administered 2023-02-24: 8.5 [IU]/h via INTRAVENOUS
  Administered 2023-02-25: 4.2 [IU]/h via INTRAVENOUS
  Administered 2023-02-26: 8.5 [IU]/h via INTRAVENOUS
  Filled 2023-02-24 (×3): qty 100

## 2023-02-24 MED ORDER — SODIUM CHLORIDE 0.9 % IV BOLUS: 1000.0000 mL | Freq: Once | INTRAVENOUS | Status: DC

## 2023-02-24 MED ORDER — ACETAMINOPHEN 160 MG/5ML PO SOLN: 650.0000 mg | ORAL | Status: DC | PRN

## 2023-02-24 MED ORDER — ACETAMINOPHEN 325 MG PO TABS: 650.0000 mg | ORAL_TABLET | ORAL | Status: DC | PRN

## 2023-02-24 MED ORDER — PANTOPRAZOLE SODIUM 40 MG IV SOLR
40.0000 mg | Freq: Two times a day (BID) | INTRAVENOUS | Status: DC
  Administered 2023-02-24 – 2023-03-01 (×10): 40 mg via INTRAVENOUS
  Filled 2023-02-24 (×10): qty 10

## 2023-02-24 MED ORDER — ASPIRIN 81 MG PO CHEW: 81.0000 mg | CHEWABLE_TABLET | Freq: Every day | ORAL | Status: DC

## 2023-02-24 MED ORDER — SUCCINYLCHOLINE CHLORIDE 200 MG/10ML IV SOSY
PREFILLED_SYRINGE | INTRAVENOUS | Status: DC | PRN
  Administered 2023-02-24: 140 mg via INTRAVENOUS

## 2023-02-24 MED ORDER — ORAL CARE MOUTH RINSE: 15.0000 mL | OROMUCOSAL | Status: DC | PRN

## 2023-02-24 MED ORDER — STROKE: EARLY STAGES OF RECOVERY BOOK
Freq: Once | Status: AC
  Filled 2023-02-24: qty 1

## 2023-02-24 MED ORDER — SODIUM CHLORIDE 0.9 % IV SOLN
2.0000 g | Freq: Once | INTRAVENOUS | Status: AC
  Administered 2023-02-24: 2 g via INTRAVENOUS
  Filled 2023-02-24: qty 12.5

## 2023-02-24 NOTE — ED Notes (Signed)
Pt asking for something to eat, explained to pt he is NPO at this time.

## 2023-02-24 NOTE — Consult Note (Signed)
[facility] Telemedicine Consult   Requesting Provider: Charleen Kirks, I Consult Participants: Patient, referring MD< bedside nurse Location of the provider: [place], [place] Location of the patient: [facility]  This consult was provided via telemedicine with 2-way video and audio communication. The patient/family was informed that care would be provided in this way and agreed to receive care in this manner.    Chief Complaint: Aphasia  HPI: 77 yo M who was last definitely normal last weekend, but who reports more difficulty with talking over the last few hours. He was originally thought to be mild confusion due to DKA, but on evaluation by the admitting hospitalist, he was found to be aphasic. His bedside nurse reports that this is a significant change from his initial presentation at which time he was speaking in full sentences.    I spoke to his daughter who also confirmed that he was speaking normally when she called around 3pm, but then at 5pm seemed very out of sorts.   At baseline, he lives alone, but does need some help with shopping, etc as he no longer drives.   LKW: yesterday, though unclear time tpa given?: No, out of window. Also heme positive stools. IR Thrombectomy? yes MRS: 3 Time of teleneurologist evaluation: 17:29  Exam: Vitals:   02/23/2023 1530 02/05/2023 1628  BP: (!) 108/50   Pulse: 89   Resp: 18   Temp:  98.1 F (36.7 C)  SpO2: 97%     General: in bed, nad  1A: Level of Consciousness - 0 1B: Ask Month and Age - 2 1C: 'Blink Eyes' & 'Squeeze Hands' - 0 2: Test Horizontal Extraocular Movements - 0 3: Test Visual Fields - 2 4: Test Facial Palsy - 1 5A: Test Left Arm Motor Drift - 0 5B: Test Right Arm Motor Drift - 0 6A: Test Left Leg Motor Drift - 0 6B: Test Right Leg Motor Drift - 0 7: Test Limb Ataxia - 0 8: Test Sensation - 0 9: Test Language/Aphasia- 2 10: Test Dysarthria - 0 11: Test Extinction/Inattention - 0 NIHSS score: 7   Imaging  Reviewed: CTA - left PCA occlusion with perfusion deficit.   Labs reviewed in epic and pertinent values follow: Cr 2.97 BUN 70 Glucose 787 -> 312    Assessment: 77 yo M presenting with mild confusion presumed due to DKA who subsequently developed severe aphasia and hemianopia. I suspect that he occluded his PCA sometime between 3 and 5 pm, though difficult to be certain given his antecedant mild confusion. He is not a tnk candidate in any case given his anemia with heme + stool. He has MCA stenosis as well, and I wonder if some of his aphasia is posterior watershed, but could also be thalamic. Given the degree of difficulty communicating, I felt that attempting to reperfuse would be prudent and discussed with the daughter who felt that she would want to proceed.  Recommendations:  1) Emergent transfer for PCA thrombectomy.  2) Would transfuse on arrival to [facility] given he has not received ordered blood yet.  3) Given the medical complexity(DKA, Acute GI bleed, Electrolyte disturbance, AKI, elevated troponin) I have discussed with CCM 4) Stroke team to follow.      This patient is receiving care for possible acute neurological changes. There was 70 minutes of care by this provider at the time of service, including time for direct evaluation via telemedicine, review of medical records, imaging studies and discussion of findings with providers, the patient and/or family.  Roland Rack, MD [facility] [phone]  If 7pm- 7am, please page neurology on call as listed in [place].

## 2023-02-24 NOTE — Anesthesia Procedure Notes (Signed)
Procedure Name: Intubation Date/Time: 02/24/2023 7:50 PM  Performed by: Clovis Cao, CRNAPre-anesthesia Checklist: Patient identified, Emergency Drugs available, Suction available and Patient being monitored Patient Re-evaluated:Patient Re-evaluated prior to induction Oxygen Delivery Method: Circle system utilized Preoxygenation: Pre-oxygenation with 100% oxygen Induction Type: IV induction Ventilation: Two handed mask ventilation required Laryngoscope Size: Glidescope and 4 Grade View: Grade I Tube type: Oral Tube size: 8.0 mm Number of attempts: 2 (DL x 1 by CRNA with Sabra Heck 2- grade 4 view and esophageal intubation. 2 handed mask initiated. DL x 1 by CRNA with Glidescope with grade 1 view and easy intubation. Pt stable.) Airway Equipment and Method: Stylet and Oral airway Placement Confirmation: ETT inserted through vocal cords under direct vision, positive ETCO2 and breath sounds checked- equal and bilateral Tube secured with: Tape Dental Injury: Teeth and Oropharynx as per pre-operative assessment  Difficulty Due To: Difficult Airway- due to reduced neck mobility

## 2023-02-24 NOTE — ED Notes (Signed)
[place] ED Charge RN to give report, [place], said he will pass it to the next shift and will be ready for him.

## 2023-02-24 NOTE — ED Provider Notes (Signed)
[facility] Provider Note    Event Date/Time   First MD Initiated Contact with Patient 02/24/23 1153     (approximate)   History   Weakness and Hyperglycemia   HPI  [name] is a 77 y.o. male with past medical history of diabetes, A-fib, coronary disease, heart failure, here with confusion and generalized weakness with hyperglycemia.  The patient is confused, oriented to person only, limiting history.  He states that he has been having "sugar attacks" and that he feels generally weak, fatigued, and somewhat shaky.  Denies any pain currently.  Denies any focal numbness or weakness.  He is not sure why his sugars have been elevated and he denies any recent illnesses or fever.     Physical Exam   Triage Vital Signs: ED Triage Vitals  Enc Vitals Group     BP 02/24/23 1148 (!) 145/59     Pulse Rate 02/24/23 1139 82     Resp 02/24/23 1139 (!) 26     Temp 02/24/23 1139 98.9 F (37.2 C)     Temp Source 02/24/23 1139 Oral     SpO2 02/24/23 1139 95 %     Weight 02/24/23 1140 237 lb 11.2 oz (107.8 kg)     Height 02/24/23 1140 6' (1.829 m)     Head Circumference --      Peak Flow --      Pain Score 02/24/23 1140 0     Pain Loc --      Pain Edu? --      Excl. in [place]? --     Most recent vital signs: Vitals:   02/24/23 1400 02/24/23 1430  BP: 134/61 (!) 132/58  Pulse: 82 91  Resp: (!) 28 (!) 28  Temp:    SpO2: 98% 95%     General: Awake, no distress.  CV:  Good peripheral perfusion.  Resp:  Mild tachypnea with overall normal breath sounds. Abd:  No distention.  No tenderness.  Exam somewhat limited by habitus. Other:  Mildly dry mucous membranes.   ED Results / Procedures / Treatments   Labs (all labs ordered are listed, but only abnormal results are displayed) Labs Reviewed  CBC WITH DIFFERENTIAL/PLATELET - Abnormal; Notable for the following components:      Result Value   WBC 17.6 (*)    RBC 2.36 (*)    Hemoglobin 6.6 (*)     HCT 20.4 (*)    RDW 18.6 (*)    Neutro Abs 15.1 (*)    Lymphs Abs 0.6 (*)    Monocytes Absolute 1.6 (*)    Abs Immature Granulocytes 0.19 (*)    All other components within normal limits  COMPREHENSIVE METABOLIC PANEL - Abnormal; Notable for the following components:   Sodium 122 (*)    Potassium 6.3 (*)    Chloride 92 (*)    CO2 16 (*)    Glucose, Bld 787 (*)    BUN 70 (*)    Creatinine, Ser 2.97 (*)    Calcium 7.6 (*)    Albumin 3.3 (*)    Total Bilirubin 1.5 (*)    GFR, Estimated 21 (*)    All other components within normal limits  BRAIN NATRIURETIC PEPTIDE - Abnormal; Notable for the following components:   B Natriuretic Peptide 685.7 (*)    All other components within normal limits  LACTIC ACID, PLASMA - Abnormal; Notable for the following components:   Lactic Acid, Venous 2.7 (*)  All other components within normal limits  LACTIC ACID, PLASMA - Abnormal; Notable for the following components:   Lactic Acid, Venous 2.0 (*)    All other components within normal limits  BLOOD GAS, VENOUS - Abnormal; Notable for the following components:   pCO2, Ven 33 (*)    pO2, Ven <31 (*)    Bicarbonate 17.0 (*)    Acid-base deficit 8.1 (*)    All other components within normal limits  BETA-HYDROXYBUTYRIC ACID - Abnormal; Notable for the following components:   Beta-Hydroxybutyric Acid 3.77 (*)    All other components within normal limits  URINALYSIS, ROUTINE W REFLEX MICROSCOPIC - Abnormal; Notable for the following components:   Color, Urine STRAW (*)    APPearance CLEAR (*)    Glucose, UA >=500 (*)    Ketones, ur 5 (*)    Protein, ur 30 (*)    All other components within normal limits  BASIC METABOLIC PANEL - Abnormal; Notable for the following components:   Sodium 126 (*)    Potassium 5.6 (*)    Chloride 95 (*)    CO2 17 (*)    Glucose, Bld 732 (*)    BUN 72 (*)    Creatinine, Ser 2.96 (*)    Calcium 7.8 (*)    GFR, Estimated 21 (*)    All other components within  normal limits  CBG MONITORING, ED - Abnormal; Notable for the following components:   Glucose-Capillary >600 (*)    All other components within normal limits  CBG MONITORING, ED - Abnormal; Notable for the following components:   Glucose-Capillary 529 (*)    All other components within normal limits  TROPONIN I (HIGH SENSITIVITY) - Abnormal; Notable for the following components:   Troponin I (High Sensitivity) 2,280 (*)    All other components within normal limits  CULTURE, BLOOD (ROUTINE X 2)  CULTURE, BLOOD (ROUTINE X 2)  CULTURE, BLOOD (SINGLE)  PREPARE RBC (CROSSMATCH)  TYPE AND SCREEN     EKG Normal sinus rhythm, ventricular rate 83.  PR 214, QRS 179, QTc 538.  No acute ST elevations or depression.  No acute evidence of acute ischemia or infarct.  Nonspecific intraventricular conduction delay.   RADIOLOGY Chest x-ray: small R pleural effusion, low lung volumes   I also independently reviewed and agree with radiologist interpretations.   PROCEDURES:  Critical Care performed: Yes, see critical care procedure note(s)  .Critical Care  Performed by: Duffy Bruce, MD Authorized by: Duffy Bruce, MD   Critical care provider statement:    Critical care time (minutes):  30   Critical care time was exclusive of:  Separately billable procedures and treating other patients   Critical care was necessary to treat or prevent imminent or life-threatening deterioration of the following conditions:  Cardiac failure, circulatory failure, respiratory failure, metabolic crisis and endocrine crisis   Critical care was time spent personally by me on the following activities:  Development of treatment plan with patient or surrogate, discussions with consultants, evaluation of patient's response to treatment, examination of patient, ordering and review of laboratory studies, ordering and review of radiographic studies, ordering and performing treatments and interventions, pulse oximetry,  re-evaluation of patient's condition and review of old charts     MEDICATIONS ORDERED IN ED: Medications  0.9 %  sodium chloride infusion (has no administration in time range)  insulin regular, human (MYXREDLIN) 100 units/ 100 mL infusion (10.5 Units/hr Intravenous Rate/Dose Change 02/24/23 1551)  lactated ringers infusion ( Intravenous New  Bag/Given 02/24/23 1407)  dextrose 5 % in lactated ringers infusion (has no administration in time range)  dextrose 50 % solution 0-50 mL (has no administration in time range)  sodium chloride 0.9 % bolus 500 mL (0 mLs Intravenous Stopped 02/24/23 1404)  ceFEPIme (MAXIPIME) 2 g in sodium chloride 0.9 % 100 mL IVPB (0 g Intravenous Stopped 02/24/23 1350)  metroNIDAZOLE (FLAGYL) IVPB 500 mg (0 mg Intravenous Stopped 02/24/23 1350)  calcium gluconate 1 g/ 50 mL sodium chloride IVPB (0 mg Intravenous Stopped 02/24/23 1453)  aspirin chewable tablet 324 mg (324 mg Oral Given 02/24/23 1343)  vancomycin (VANCOREADY) IVPB 2000 mg/400 mL (0 mg Intravenous Stopped 02/24/23 1525)     IMPRESSION / MDM / ASSESSMENT AND PLAN / ED COURSE  I reviewed the triage vital signs and the nursing notes.                              Differential diagnosis includes, but is not limited to, DKA, sepsis, metabolic encephalopathy, ACS, HHS, occult sepsis  Patient's presentation is most consistent with acute presentation with potential threat to life or bodily function.  The patient is on the cardiac monitor to evaluate for evidence of arrhythmia and/or significant heart rate changes  77 yo M with PMHx DM, HTN, HLD here with hyperglycemia and confusion. Pt appears to be in DKA again with Glu 787, AKI with BUN 70/Cr 2.97, AG 14, CO2 16. Pt also has significant leukocytosis with left-shift, this along with lactic acidosis is questionable for sepsis. Covered empirically.  Of note, EKG shows non specific ST changes laterally in precordial leads and trop >2000. Likely related to his AKI,  acidosis, hyperK. Discussed with Cardiology, Dr. Saralyn Pilar. Given his anemia with +hemoccult stools, and no chest pain, feel risks of heparinization outweigh benefits in the absence of significant EKG changes.   Pt given IVF, IV insulin, calcium, and will admit to step-down. ASA given and will hold heparin as above, pending repeat and EKGs. 1u PRBC ordered for his acute on chronic anemia.    FINAL CLINICAL IMPRESSION(S) / ED DIAGNOSES   Final diagnoses:  Diabetic ketoacidosis without coma associated with type 2 diabetes mellitus (HCC)  Acute on chronic anemia  Elevated troponin  AKI (acute kidney injury) ([place])     Rx / DC Orders   ED Discharge Orders     None        Note:  This document was prepared using Dragon voice recognition software and may include unintentional dictation errors.   Duffy Bruce, MD 02/24/23 [phone]

## 2023-02-24 NOTE — ED Notes (Signed)
92 Pt returns from ct. [id number] Dr Leonel Ramsay advises pt with left pca occlusion. He is attempting to contact family via phone

## 2023-02-24 NOTE — ED Notes (Signed)
Called [facility] for Emergency Traffic transport to [employer] [facility]

## 2023-02-24 NOTE — Assessment & Plan Note (Signed)
-   Hold home antihypertensive therapy

## 2023-02-24 NOTE — Progress Notes (Signed)
eLink Physician-Brief Progress Note Patient Name: [name] DOB: 12-01-1946 MRN: [id number]   Date of Service  02/15/2023  HPI/Events of Note  Patient admitted with PCA stroke s/p thrombectomy, acute respiratory failure on the ventilator, DKA, and AKI. He's also had GI bleeding with acute blood loss anemia requiring transfusion of 3 units PRBC.  eICU Interventions  New Patient Evaluation.        [name] 02/08/2023, 11:28 PM

## 2023-02-24 NOTE — Assessment & Plan Note (Signed)
WBC markedly elevated on presentation.  No obvious evidence of infection at this time.  Potentially reactive in the setting of underlying DKA, NSTEMI, and acute CVA.  Patient received broad-spectrum coverage in the ED.   -Hold further antibiotics for now.  Low threshold to restart antimicrobial coverage if patient should become febrile or acute signs of infection develop - Blood cultures pending

## 2023-02-24 NOTE — ED Notes (Signed)
1727 Activation of cart with RN advising while in ED staff have noted a decline in his speech and r side weakness.   1728 Dr Leonel Ramsay paged 1729 Dr Leonel Ramsay joins cart.   1741 Pt to CT

## 2023-02-24 NOTE — Assessment & Plan Note (Signed)
Patient's BNP is elevated on admission compared to prior, however he does not appear hypervolemic, likely due to DKA.  Will rehydrate cautiously and hold any diuretics for now.  - Echocardiogram ordered - Daily weights - N.p.o. at this time

## 2023-02-24 NOTE — Anesthesia Procedure Notes (Signed)
Arterial Line Insertion Start/End2/28/2024 7:48 PM, 02/24/2023 7:55 PM Performed by: Santa Lighter, MD, anesthesiologist  Patient location: OOR procedure area. Preanesthetic checklist: patient identified, IV checked, site marked, risks and benefits discussed, surgical consent, monitors and equipment checked, pre-op evaluation, timeout performed and anesthesia consent Lidocaine 1% used for infiltration Left, radial was placed Catheter size: 20 G Hand hygiene performed  and maximum sterile barriers used   Attempts: 2 Procedure performed without using ultrasound guided technique. Following insertion, dressing applied and Biopatch. Post procedure assessment: normal and unchanged  Patient tolerated the procedure well with no immediate complications.

## 2023-02-24 NOTE — ED Notes (Signed)
51 Dr Leonel Ramsay advises he has spoken to EDP reference transfer to [name] for intervention.   States no [facility] transport available at this time.   [place] verified no transport available [place] spoke with EDP who advises charge nurse on phone with local ems to run [facility] emergency traffic [facility].

## 2023-02-24 NOTE — Assessment & Plan Note (Signed)
EKG on admission with sinus rhythm.  Last dose of Eliquis is unknown, as patient is altered; family suspect he has not taken it since 2 to 3 days ago.  - Hold home Eliquis

## 2023-02-24 NOTE — Anesthesia Preprocedure Evaluation (Addendum)
Anesthesia Evaluation  Patient identified by MRN, date of birth, ID band Patient confused    Reviewed: Allergy & Precautions, NPO status , Patient's Chart, lab work & pertinent test results, reviewed documented beta blocker date and time , Unable to perform ROS - Chart review onlyPreop documentation limited or incomplete due to emergent nature of procedure.  Airway Mallampati: II  TM Distance: >3 FB Neck ROM: Full    Dental  (+) Dental Advisory Given, Poor Dentition   Pulmonary former smoker   Pulmonary exam normal breath sounds clear to auscultation       Cardiovascular hypertension, Pt. on medications and Pt. on home beta blockers + CAD, + Past MI, + CABG, + Peripheral Vascular Disease and +CHF  Normal cardiovascular exam+ dysrhythmias Atrial Fibrillation  Rhythm:Regular Rate:Normal     Neuro/Psych  Headaches PSYCHIATRIC DISORDERS Anxiety Depression    TIA Neuromuscular disease CVA, Residual Symptoms    GI/Hepatic negative GI ROS, Neg liver ROS,,,  Endo/Other  diabetes, Type 2, Oral Hypoglycemic Agents, Insulin Dependent  Obesity   Renal/GU Renal Insufficiency and ARFRenal disease     Musculoskeletal negative musculoskeletal ROS (+)    Abdominal   Peds  Hematology  (+) Blood dyscrasia, anemia   Anesthesia Other Findings   Reproductive/Obstetrics                             Anesthesia Physical Anesthesia Plan  ASA: 4 and emergent  Anesthesia Plan:    Post-op Pain Management:    Induction: Intravenous, Rapid sequence and Cricoid pressure planned  PONV Risk Score and Plan: 1 and Treatment may vary due to age or medical condition  Airway Management Planned: Oral ETT  Additional Equipment: Arterial line  Intra-op Plan:   Post-operative Plan: Post-operative intubation/ventilation  Informed Consent:      Only emergency history available and History available from chart  only  Plan Discussed with: CRNA  Anesthesia Plan Comments: (CODE STROKE. Pre-op eval completed after induction of anesthesia due to emergent nature of procedure.)        Anesthesia Quick Evaluation

## 2023-02-24 NOTE — H&P (Signed)
History and Physical    Patient: [name] [id number] DOB: 12/19/1946 DOA: 02/12/2023 DOS: the patient was seen and examined on 02/23/2023 PCP: Fred Ruths, MD  Patient coming from: Home  Chief Complaint:  Chief Complaint  Patient presents with   Weakness   Hyperglycemia   HPI: [name] is a 77 y.o. male with medical history significant of uncontrolled type 2 diabetes, atrial fibrillation on Eliquis, CAD s/p CABG, CVA, hypertension, hyperlipidemia, HFpEF, restless leg syndrome who presents to the ED 2/2 weakness.  [name] states he is not in pain, including chest pain, but he cannot answers any questions about why he is here or what brought him in. He is having difficulty answering questions with appropriate words.  Patient's son, [name], states patient fell either 2 or 3 days ago, and since then, he had difficulty moving due to pain. Due to this, he likely was not taking his home medications.  [name] states that he is uncertain when patient's altered mental has began but believes it was 2 days ago.  He is uncertain when patient began having difficulty finding his words but states that he frequently becomes altered when he is in a hyperglycemic state.  [name] notes that patient lives alone and is independent in ADLs/IADLs.  ED Course: On arrival to the ED, patient was hypertensive at 145/59 with heart rate of 82.  He was saturating at 95% on room air.  He was afebrile at 98.9.  Initial workup remarkable for for pH of 7.32, pCO2 of 33, and CBG of 787.  CBC notable for WBC of 17.6 and hemoglobin of 6.6.  CMP with potassium of 6.3, sodium of 122, bicarb of 16, anion gap of 14, BUN of 70, creatinine 2.97 with GFR of 21. BNP elevated at 685.  Initial troponin elevated at 2280.  Beta hydroxybutyrate acid elevated at 3.77.  Initial lactic acid elevated at 2.7 with improvement to 2.0.  Urinalysis notable for glucosuria, ketonuria and proteinuria.  Due to NSTEMI with  acute anemia, cardiology was consulted and recommended deferring heparin infusion at this time.  Patient started on DKA protocol with Endo tool.  He received 500 cc bolus and calcium gluconate given hyperkalemia.  TRH contacted for admission.  Review of Systems: unable to review all systems due to the inability of the patient to answer questions.  Past Medical History:  Diagnosis Date   Atrial fibrillation ([place])    a.) CHA2DS2VASc = 6 (age x 2,  HTN, CVA/TIA x 2, T2DM);  b.) s/p DCCV 09/25/2020 (120 J x1); c.) rate/rhythm maintained on oral metoprolol tartrate; chronically anticoagulated with apixaban   BPH (benign prostatic hyperplasia)    CAD (coronary artery disease) 10/28/2016   a.) LHC 10/28/2016: 95% dLAD, 90% p-mLCx, 75% mLAD, 60% m-dLAD, 99% p-mRCA, 75% dRCA --> consult CVTS; b.) 3v [facility]G [facility] 11/05/2016   Carotid artery disease ([place]) 06/29/2016   a.) carotid US 06/29/2016: <50% BICA   Cerebellar stroke ([place]) 11/13/2015   a.) CT imaging 11/13/2015 revealed old/remote LEFT cerebellar and LEFT carona radiata infarcts   CHF (congestive heart failure) (HCC)    Depression    Diabetic foot ulcers (HCC)    Diastolic dysfunction [id number]   a.) TTE 06/28/2016: EF 55-60%, G1DD; b.) TTE 03/19/2020: EF 55-60%, mild BAE, mild-mod MR/TR.   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[place]) 06/28/2016   a.) MRI 06/28/2016: acute RIGHT paracentral pontine (non-hemorrhagic) CVA   S/P CABG x 3 11/05/2016   a.) LIMA-LAD, SVG-OM1, SVG-PDA   TIA (transient ischemic attack)    a.) multiple since last CVA in 2017 per daughter   Tubular adenoma of colon    Type 2 diabetes mellitus treated with insulin [facility])    Past Surgical History:   Procedure Laterality Date   AMPUTATION TOE Right 06/14/2022   Procedure: RIGHT GREAT TOE AMPUTATION;  Surgeon: Samara Deist, DPM;  Location: [facility] ORS;  Service: Podiatry;  Laterality: Right;   AMPUTATION TOE Right 09/25/2022   Procedure: AMPUTATION TOE - SECOND;  Surgeon: Samara Deist, DPM;  Location: [facility] ORS;  Service: Podiatry;  Laterality: Right;   AMPUTATION TOE Right 11/25/2022   Procedure: AMPUTATION RIGHT 3RD AND 4TH TOES;  Surgeon: Samara Deist, DPM;  Location: [facility] ORS;  Service: Podiatry;  Laterality: Right;   CARDIAC CATHETERIZATION Left 10/28/2016   Procedure: Left Heart Cath and Coronary Angiography;  Surgeon: Corey Skains, MD;  Location: [place] [facility] LAB;  Service: Cardiovascular;  Laterality: Left;   CARDIOVERSION N/A 09/25/2020   Procedure: CARDIOVERSION;  Surgeon: Corey Skains, MD;  Location: [facility] ORS;  Service: Cardiovascular;  Laterality: N/A;   COLONOSCOPY N/A 09/02/2022   Procedure: COLONOSCOPY;  Surgeon: Toledo, Benay Pike, MD;  Location: [facility] ENDOSCOPY;  Service: Gastroenterology;  Laterality: N/A;  IDDM   LOWER EXTREMITY ANGIOGRAPHY Right 06/16/2022   Procedure: Lower Extremity Angiography;  Surgeon: Katha Cabal, MD;  Location: [place] [facility];  Service: Cardiovascular;  Laterality: Right;   SHOULDER ARTHROSCOPY     TRIGGER FINGER RELEASE     UVULOPALATOPHARYNGOPLASTY, TONSILLECTOMY AND SEPTOPLASTY     Social History:  reports that he has quit smoking. His smoking use included cigars. He has never used smokeless tobacco. He reports that he does not drink alcohol and does not use drugs.  No Known Allergies  Family History  Problem Relation Age of Onset   Hypertension Other    Diabetes Mellitus II Other    Diabetes Mother    Diabetes Sister    Atrial fibrillation Sister    Atrial fibrillation Father    Atrial fibrillation Daughter     Prior to Admission medications   Medication Sig Start Date End Date Taking? Authorizing  Provider  acetaminophen (TYLENOL) 325 MG tablet Take 2 tablets (650 mg total) by mouth every 6 (six) hours as needed for mild pain, fever or headache (or Fever >/= 101). 11/28/22   Lorella Nimrod, MD  amLODipine (NORVASC) 10 MG tablet Take 1 tablet (10 mg total) by mouth daily. 11/28/22   Lorella Nimrod, MD  apixaban (ELIQUIS) 5 MG TABS tablet Take 1 tablet (5 mg total) by mouth 2 (two) times daily. 03/20/20   Lavina Hamman, MD  calcium carbonate (TUMS - DOSED IN MG ELEMENTAL CALCIUM) 500 MG chewable tablet Chew 1 tablet by mouth daily as needed for indigestion or heartburn.    [provider]  cholecalciferol (VITAMIN D) 1000 UNITS tablet Take 1,000 Units by mouth daily.    [provider]  clopidogrel (PLAVIX) 75 MG tablet Take 75 mg by mouth daily.    [provider]  docusate sodium (COLACE) 100 MG capsule Take 100 mg by mouth 2 (two) times daily.  [provider]  finasteride (PROSCAR) 5 MG tablet Take 5 mg by mouth daily.    [provider]  furosemide (LASIX) 40 MG tablet Take 20 mg by mouth 2 (two) times daily.    [provider]  gabapentin (NEURONTIN) 100 MG capsule Take 100 mg by mouth 3 (three) times daily.    [provider]  icosapent Ethyl (VASCEPA) 1 g capsule Take 2 g by mouth 2 (two) times daily.     [provider]  insulin aspart (NOVOLOG) 100 UNIT/ML injection Inject 20 Units into the skin 3 (three) times daily before meals. Patient taking differently: Inject 20-25 Units into the skin See admin instructions. Inject 30 units before breakfast, 30 units before lunch, and 35 units before supper 03/20/20   Lavina Hamman, MD  insulin glargine (LANTUS) 100 UNIT/ML injection Inject 0.9 mLs (90 Units total) into the skin at bedtime. Home med. Patient taking differently: Inject 100 Units into the skin at bedtime. Home med. 06/17/22   Enzo Bi, MD  losartan (COZAAR) 50 MG tablet Take 1 tablet (50 mg total) by mouth  daily. 11/28/22   Lorella Nimrod, MD  metFORMIN (GLUCOPHAGE) 1000 MG tablet Take 1,000 mg by mouth 2 (two) times daily with a meal.     [provider]  metoprolol tartrate (LOPRESSOR) 25 MG tablet Take 12.5 mg by mouth 2 (two) times daily.    [provider]  nortriptyline (PAMELOR) 10 MG capsule Take 30 mg by mouth at bedtime.     [provider]  PARoxetine (PAXIL) 40 MG tablet Take 60 mg by mouth daily.     [provider]  polyethylene glycol (MIRALAX / GLYCOLAX) 17 g packet Take 17 g by mouth daily as needed for mild constipation. 11/28/22   Lorella Nimrod, MD  rOPINIRole (REQUIP) 0.5 MG tablet Take 0.5 mg by mouth at bedtime.    [provider]  Semaglutide, 2 MG/DOSE, 8 MG/3ML SOPN Inject 2 mg into the skin every 7 (seven) days. 10/27/22   [provider]  simvastatin (ZOCOR) 40 MG tablet Take 20 mg by mouth at bedtime.    [provider]  tamsulosin (FLOMAX) 0.4 MG CAPS capsule Take 0.4 mg by mouth daily after lunch.     [provider]  vitamin B-12 (CYANOCOBALAMIN) 1000 MCG tablet Take 1,000 mcg by mouth at bedtime.    [provider]    Physical Exam: Vitals:   02/05/2023 1500 01/28/2023 1530 02/14/2023 1628 02/13/2023 1900  BP: (!) 118/59 (!) 108/50  (!) 123/55  Pulse: 93 89  85  Resp: (!) 25 18  (!) 22  Temp:   98.1 F (36.7 C) 98.7 F (37.1 C)  TempSrc:   Oral Oral  SpO2: 97% 97%  96%  Weight:      Height:       Physical Exam Vitals reviewed.  Constitutional:      Appearance: He is obese. He is ill-appearing.  HENT:     Head: Normocephalic and atraumatic.     Mouth/Throat:     Mouth: Mucous membranes are moist.     Pharynx: Oropharynx is clear.  Eyes:     Conjunctiva/sclera: Conjunctivae normal.     Pupils: Pupils are equal, round, and reactive to light.  Cardiovascular:     Rate and Rhythm: Normal rate and regular rhythm.     Heart sounds: No murmur heard. Pulmonary:     Effort: Pulmonary  effort is normal. Tachypnea present. No  respiratory distress.     Breath sounds: Decreased breath sounds (bibasilar) present. No wheezing, rhonchi or rales.  Abdominal:     General: Bowel sounds are normal. There is distension.     Palpations: Abdomen is soft.     Tenderness: There is no abdominal tenderness. There is no guarding.  Musculoskeletal:     Right lower leg: No edema.     Left lower leg: No edema.  Skin:    General: Skin is warm and dry.     Findings: No bruising, erythema or rash.  Neurological:     Mental Status: He is alert.     Comments:  Patient is alert and oriented only to person and time.  He is having difficulty answering questions appropriately, is occasionally noncoherent.  At times he is coherent but is having difficulty naming objects correctly.  For example, he called the door "day".  Unable to name objects he was pointing to.  5 out of 5 strength in bilateral upper and lower extremities  No facial asymmetry or dysarthria noted  Difficulty discerning sensation as patient having difficulty answering questions  Right-sided neglect with visual field testing     Data Reviewed: CBC with WBC of 17.6, hemoglobin of 6.6, MCV of 86.4 and platelets of 171 CMP with sodium of 122, potassium of 6.3, chloride 92, bicarb of 16, glucose of 787, anion gap of 14, BUN 70, creatinine 2.97, GFR 21, AST 27, ALT 20 and total bilirubin 1.5. BNP elevated 685 Troponin elevated at 2280 Beta hydroxy butyrate acid elevated at 3.77 VBG with pH 7.32 and pCO2 of 33 Initial lactic acid elevated at 2.7  EKG personally reviewed.  Sinus rhythm with prolonged PR interval.  RBBB.  QTc prolongation noted.  Compared to EKG obtained in December 2023, patient is no longer in atrial fibrillation, RBBB still present.  CT ANGIO HEAD NECK W WO CM W PERF (CODE STROKE)  Result Date: 02/03/2023 CLINICAL DATA:  Concern for left MCA or PCA stroke. Decline in speech and right-sided weakness. EXAM: CT  ANGIOGRAPHY HEAD AND NECK CT PERFUSION BRAIN TECHNIQUE: Multidetector CT imaging of the head and neck was performed using the standard protocol during bolus administration of intravenous contrast. Multiplanar CT image reconstructions and MIPs were obtained to evaluate the vascular anatomy. Carotid stenosis measurements (when applicable) are obtained utilizing NASCET criteria, using the distal internal carotid diameter as the denominator. Multiphase CT imaging of the brain was performed following IV bolus contrast injection. Subsequent parametric perfusion maps were calculated using RAPID software. RADIATION DOSE REDUCTION: This exam was performed according to the departmental dose-optimization program which includes automated exposure control, adjustment of the mA and/or kV according to patient size and/or use of iterative reconstruction technique. CONTRAST:  170m OMNIPAQUE IOHEXOL 350 MG/ML SOLN COMPARISON:  Same-day CT head.  Carotid Doppler 03/19/2020. FINDINGS: CTA NECK FINDINGS Aortic arch: There is mild calcified plaque in the imaged aortic arch. The origins of the major branch vessels are patent. The subclavian arteries are patent to the level imaged. Right carotid system: The right common, internal, and external carotid arteries are patent with calcified plaque at the bifurcation resulting in less than 50% stenosis of the proximal internal carotid artery. The distal internal carotid artery is patent the external carotid artery is patent. There is no dissection or aneurysm. Left carotid system: The left common carotid artery is patent. There is bulky plaque at the bifurcation resulting in up to approximately 70% stenosis. The distal internal carotid artery is patent.  The external carotid artery is patent. There is no evidence of dissection or aneurysm. Vertebral arteries: The vertebral arteries are patent, without hemodynamically significant stenosis or occlusion. There is no evidence of dissection or  aneurysm Skeleton: There is no acute osseous abnormality or suspicious osseous lesion. There is no visible canal hematoma. Other neck: The soft tissues of the neck are unremarkable. Upper chest: The imaged lung apices are clear. Review of the MIP images confirms the above findings CTA HEAD FINDINGS Anterior circulation: There is extensive calcified plaque in the carotid siphons resulting severe stenosis bilaterally. The left M1 segment is patent. There is short-segment severe stenosis of left M2 branches in the sylvian fissure proximally (8-117). There is no evidence of occlusion. There is moderate stenosis of the distal right M1 segment. There is focal moderate stenosis of a right M2 branch in the sylvian fissure (8-123). The distal branches are otherwise patent, without other proximal high-grade stenosis or occlusion. There is severe stenosis of the origin of the right A1 segment (6-162). Otherwise, the ACAs are patent, without other proximal high-grade stenosis or occlusion. The anterior communicating artery is normal. There is no aneurysm or AVM. Posterior circulation: There is calcified plaque in the bilateral V4 segments resulting severe stenosis bilaterally. The PICA origins are not identified. The basilar artery is patent with moderate stenosis distally [id number]). There is multifocal short-segment occlusion of the left P1 and P2 segments (8-134, 141). There is intermittent diminutive reconstitution of flow in the distal branches. There is a fetal origin of the right PCA. There is severe stenosis of the proximal P2 segment (8-137). There is multifocal irregularity and narrowing of the distal branches without evidence of occlusion. There is no aneurysm or AVM. Venous sinuses: Patent. Anatomic variants: None. Review of the MIP images confirms the above findings CT Brain Perfusion Findings: ASPECTS: 10 on the head CT obtained 5:18 p.m. CBF (<30%) Volume: 54m Perfusion (Tmax>6.0s) volume: 222mMismatch Volume: 2045mInfarction Location:No infarct core is identified. A portion of the ischemic brain is identified in the left occipital lobe which would correspond with the PCA occlusion. IMPRESSION: 1. Multifocal short-segment occlusions of the left P1 and P2 segments with diminutive distal reconstitution. 2. 20 cc ischemic penumbra identified some of which is in the left occipital lobe which would correspond to the PCA occlusion. No core infarct identified. 3. Additional advanced intracranial atherosclerotic disease with multifocal severe stenoses in the anterior and posterior circulations as detailed above. 4. Bulky calcified plaque at the left carotid bifurcation resulting in approximately 70% stenosis of the proximal internal carotid artery. Mild plaque on the right without greater than 50% stenosis. Patent vertebral arteries in the neck. Findings discussed with Dr. KirLeonel Ramsay 5:58 p.m. Electronically Signed   By: PetValetta MoleD.   On: 02/17/2023 18:31   CT HEAD WO CONTRAST (5MM)  Result Date: 01/29/2023 CLINICAL DATA:  Acute stroke suspected, weakness, hyperglycemia EXAM: CT HEAD WITHOUT CONTRAST TECHNIQUE: Contiguous axial images were obtained from the base of the skull through the vertex without intravenous contrast. RADIATION DOSE REDUCTION: This exam was performed according to the departmental dose-optimization program which includes automated exposure control, adjustment of the mA and/or kV according to patient size and/or use of iterative reconstruction technique. COMPARISON:  03/18/2020 FINDINGS: Brain: No evidence of acute infarction, hemorrhage, hydrocephalus, extra-axial collection or mass lesion/mass effect. Unchanged subtle white matter hypodensity of the left corona radiata (series 2, image 18). Unchanged encephalomalacia of the cerebellar hemispheres. Vascular: No hyperdense vessel or unexpected  calcification. Skull: Normal. Negative for fracture or focal lesion. Sinuses/Orbits: No acute finding. Other:  None. IMPRESSION: 1. No acute intracranial pathology. 2. 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 Electronically Signed   By: Delanna Ahmadi M.D.   On: 02/17/2023 17:37   DG Chest Portable 1 View  Result Date: 02/09/2023 CLINICAL DATA:  Shortness of breath. EXAM: PORTABLE CHEST 1 VIEW COMPARISON:  12/17/2022 FINDINGS: Previous median sternotomy and CABG procedure. Stable cardiac enlargement. Lung volumes are low. Slight asymmetric elevation of the right hemidiaphragm with blunting of the right costophrenic angle. No interstitial edema or airspace disease. Visualized osseous structures appear intact. No displaced rib fractures identified. IMPRESSION: 1. Suspect small right pleural effusion. 2. Low lung volumes. 3. Stable cardiac enlargement. Electronically Signed   By: Kerby Moors M.D.   On: 02/08/2023 13:17    Results are pending, will review when available.  Assessment and Plan:  DKA (diabetic ketoacidosis) ([place]) Patient presenting with CBG in the 700s, in addition to an elevated BHA and metabolic acidosis, however anion gap is only mildly elevated.  Evidence of ketones on urinalysis.  Likely in the setting of medication noncompliance as patient had a fall a couple days ago and has been sore since then; son believes he has not been taking his medication due to this.  - Insulin infusion per DKA protocol - Status post 500 cc bolus - 1 L LR bolus ordered - Continue 125 cc LR until CBG less than 250.  Transition to LR-D5 at that time - BMP every 4 hours and BHA every 8 hours - Plan to hold Insulin infusion for transport to [place] insulin infusion on arrival as indicated  Acute CVA (cerebrovascular accident) [facility]) On initial evaluation, patient was notably struggling to speak and find his words.  Discussed with patient's RN, who states patient was much more  coherent on arrival and difficulty finding words has developed since arriving to the ED 5 hours prior to examination.  Due to this, discussed with Dr. Leonel Ramsay and code stroke initiated.  CTA demonstrated multifocal short segment occlusions of the left P1 and P2.  Due to need for emergent thrombectomy, plan for transfer to [facility].  - Neurology following; appreciate their recommendations - Transfer to [facility] for emergent thrombectomy.  Dr. Leonel Ramsay as accepting physician. EMTALA completed.   NSTEMI (non-ST elevated myocardial infarction) ([place]) On presentation, patient's troponin elevated at 2200.  He adamantly denies any chest pain and EKG is without any acute changes.  Cardiology consulted and recommending deferring heparin infusion given acute anemia and lack of symptoms and EKG changes.  [facility] consulted; appreciate their recommendations - Echocardiogram ordered - Repeat troponin pending - Stat EKG for any recurrence of chest pain  Acute on chronic diastolic CHF (congestive heart failure) ([place]) Patient's BNP is elevated on admission compared to prior, however he does not appear hypervolemic, likely due to DKA.  Will rehydrate cautiously and hold any diuretics for now.  - Echocardiogram ordered - Daily weights - N.p.o. at this time  AKI (acute kidney injury) ([place]) in the setting of stage II chronic kidney disease In the setting of DKA and NSTEMI  - IV fluids as ordered - Repeat BMP in the a.m. - Strict urine output monitoring - Hold home nephrotoxic agents - Consider renal ultrasound on arrival to [facility]  Acute anemia On presentation, patient's  hemoglobin was decreased at 6.6 compared to 10.1 approximately 2 months ago.  No obvious signs of bleeding on examination, however FOBT is positive.  Given Eliquis use, suspect GI related.  - 1 unit of packed RBCs ordered - Posttransfusion CBC - Protonix 40 mg IV twice daily -  Consider consulting GI on arrival to [facility]  Leukocytosis WBC markedly elevated on presentation.  No obvious evidence of infection at this time.  Potentially reactive in the setting of underlying DKA, NSTEMI, and acute CVA.  Patient received broad-spectrum coverage in the ED.   -Hold further antibiotics for now.  Low threshold to restart antimicrobial coverage if patient should become febrile or acute signs of infection develop - Blood cultures pending  Atrial flutter, paroxysmal (HCC) EKG on admission with sinus rhythm.  Last dose of Eliquis is unknown, as patient is altered; family suspect he has not taken it since 2 to 3 days ago.  - Hold home Eliquis  HTN (hypertension) - Hold home antihypertensive therapy   DISPOSITION: Given evidence of acute PCA infarct on CTA, patient requires emergent transfer to [facility] for immediate intracranial thrombectomy, which is not available at [facility] medical center.  The benefits of transfer outweigh the risks.  Discussed with both patient and family; all are in agreement.  Dr. Leonel Ramsay is accepting.  Advance Care Planning:   Code Status: Full Code due to inability to make medical decisions.   Consults: Neurology, Cardiology  Family Communication: Patient's son updated via telephone.   Severity of Illness: The appropriate patient status for this patient is INPATIENT. Inpatient status is judged to be reasonable and necessary in order to provide the required intensity of service to ensure the patient's safety. The patient's presenting symptoms, physical exam findings, and initial radiographic and laboratory data in the context of their chronic comorbidities is felt to place them at high risk for further clinical deterioration. Furthermore, it is not anticipated that the patient will be medically stable for discharge from the hospital within 2 midnights of admission.   * I certify that at the point of admission it is  my clinical judgment that the patient will require inpatient hospital care spanning beyond 2 midnights from the point of admission due to high intensity of service, high risk for further deterioration and high frequency of surveillance required.*  Author: Jose Persia, MD 02/10/2023 7:05 PM  For on call review [email or web address].

## 2023-02-24 NOTE — Assessment & Plan Note (Signed)
In the setting of DKA and NSTEMI  - IV fluids as ordered - Repeat BMP in the a.m. - Strict urine output monitoring - Hold home nephrotoxic agents - Consider renal ultrasound on arrival to [facility]

## 2023-02-24 NOTE — Progress Notes (Signed)
   02/10/2023 1800  Spiritual Encounters  Type of Visit Initial  Care provided to: Patient  Referral source Code page  Reason for visit Urgent spiritual support  OnCall Visit Yes   Chaplain responded to code stroke. No family present.  Provided compassionate presence to patient. Chaplain available for follow up as needed.

## 2023-02-24 NOTE — Assessment & Plan Note (Addendum)
Patient presenting with CBG in the 700s, in addition to an elevated BHA and metabolic acidosis, however anion gap is only mildly elevated.  Evidence of ketones on urinalysis.  Likely in the setting of medication noncompliance as patient had a fall a couple days ago and has been sore since then; son believes he has not been taking his medication due to this.  - Insulin infusion per DKA protocol - Status post 500 cc bolus - 1 L LR bolus ordered - Continue 125 cc LR until CBG less than 250.  Transition to LR-D5 at that time - BMP every 4 hours and BHA every 8 hours - Plan to hold Insulin infusion for transport to [place] insulin infusion on arrival as indicated

## 2023-02-24 NOTE — ED Triage Notes (Signed)
Pt brought to ED via [facility], per EMS daughter in law called out for weakness since Sunday, and constant urination. Pt also fell Sunday hurting right ribs. Per EMS CBG read High and pt has a strong urine odor. Pt states normally walks with walker.

## 2023-02-24 NOTE — Consult Note (Addendum)
NAME:  [name], MRN:  [id number], DOB:  1946/02/02, LOS: 0 ADMISSION DATE:  02/21/2023, CONSULTATION DATE:  02/24/2022 REFERRING MD: Bartholome Bill MD  , CHIEF COMPLAINT: Acute stroke s/p thrombectomy  History of Present Illness:   77 year old with history of uncontrolled type 2 diabetes, atrial fibrillation on Eliquis, coronary artery disease status post CABG, CVA, hypertension, hyperlipidemia, HFpEF, restless leg presenting with DKA, elevated troponin, acute PCA stroke, hyperkalemia and AKI  Pertinent  Medical History    has a past medical history of Atrial fibrillation ([place]), BPH (benign prostatic hyperplasia), CAD (coronary artery disease) (10/28/2016), Carotid artery disease ([place]) (06/29/2016), Cerebellar stroke ([place]) (11/13/2015), CHF (congestive heart failure) ([place]), Depression, Diabetic foot ulcers ([place]), Diastolic dysfunction (Q000111Q), HLD (hyperlipidemia), Hypertension, Long term (current) use of anticoagulants, Long term current use of antithrombotics/antiplatelets, Neuropathy, PTSD (post-traumatic stress disorder), Restless leg syndrome, 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   Significant Hospital Events: Including procedures, antibiotic start and stop dates in addition to other pertinent events     Interim History / Subjective:     Objective   There were no vitals taken for this visit.        Intake/Output Summary (Last 24 hours) at 02/13/2023 2229 Last data filed at 02/15/2023 2133 Gross per 24 hour  Intake 950 ml  Output 100 ml  Net 850 ml   There were no vitals filed for this visit.  Examination: Pulse 85, resp. rate 19, SpO2 100 %. Gen:      No acute distress HEENT:  EOMI, sclera anicteric Neck:     No masses; no thyromegaly, ETT Lungs:    Clear to auscultation bilaterally; normal respiratory effort CV:          Regular rate and rhythm; no murmurs Abd:      Distended, Mild ecchymosis over lt abd. Soft, + BS Ext:    No edema; adequate peripheral perfusion Skin:      Warm and dry; no rash Neuro: Sedated, unresponsive  Labs/imaging reviewed Significant for sodium 128, BUN/creatinine 66/2.70 Troponin 2280, BNP 685.7 Lactic acid 2.0 WBC 17.6, hemoglobin 5.4, platelets 171 Chest x-ray reviewed with low lung volumes, stable cardiac enlargement.  Resolved Hospital Problem list     Assessment & Plan:  Acute PCA stroke s/p thrombectomy Continue Cleviprex.  Goal SBP 1 20-1 40 Management per neurology team  DKA Continue insulin drip IV fluid hydration Repeat lactic acid  Elevated troponin Acute on chronic diastolic heart failure Paroxysmal atrial fibrillation Evaluated by cardiology at [facility] Follow repeat troponin, echocardiogram Hold Eliquis  AKI in setting of stage II CKD Hyperkalemia- Improved Monitor urine output and creatinine  Leukocytosis May be reactive Received broad antibiotics in ED for leukocytosis.  Will hold further antibiotics Check cultures  Acute anemia S/p 3 units PRBC Continue Protonix IV twice daily Stat CT abdomen  to rule out RP bleed as he has abdominal distention.  He has gotten 8 mg Integrilin during prior procedure Consider GI consult if hemoglobin continues to drop  Best Practice (right click and "Reselect all SmartList Selections" daily)   Diet/type: NPO DVT prophylaxis: SCD GI prophylaxis: PPI Lines: N/A Foley:  N/A Code Status:  full code Last date of multidisciplinary goals of care discussion '[]'$   Critical care time:    The patient is critically ill with multiple organ system failure and requires high complexity decision making for  assessment and support, frequent evaluation and titration of therapies, advanced monitoring, review of radiographic studies and interpretation of complex data.   Critical Care Time devoted to patient care  services, exclusive of separately billable procedures, described in this note is 35 minutes.   Marshell Garfinkel MD [place] See Amion for pager  If no response to pager , please call [phone] until 7pm After 7:00 pm call Elink  [phone] 01/30/2023, 10:30 PM

## 2023-02-24 NOTE — Transfer of Care (Signed)
Immediate Anesthesia Transfer of Care Note  Patient: [name]  Procedure(s) Performed: CODE IR  Patient Location: ICU  Anesthesia Type:General  Level of Consciousness: sedated and Patient remains intubated per anesthesia plan  Airway & Oxygen Therapy: Patient remains intubated per anesthesia plan and Patient placed on Ventilator (see vital sign flow sheet for setting)  Post-op Assessment: Report given to RN and Post -op Vital signs reviewed and stable  Post vital signs: Reviewed and stable  Last Vitals:  Vitals Value Taken Time  BP 125/58 02/24/23 2228  Temp    Pulse 83 02/24/23 2235  Resp 18 02/24/23 2238  SpO2 100 % 02/24/23 2235  Vitals shown include unvalidated device data.  Last Pain: There were no vitals filed for this visit.       Complications: No notable events documented.

## 2023-02-24 NOTE — ED Notes (Signed)
1854 Pt awaiting transport.

## 2023-02-24 NOTE — Consult Note (Signed)
CODE SEPSIS - PHARMACY COMMUNICATION  **Broad Spectrum Antibiotics should be administered within 1 hour of Sepsis diagnosis**  Time Code Sepsis Called/Page Received: 1317  Antibiotics Ordered: Cefepime, metronidazole, and Vancomycin  Time of 1st antibiotic administration: 1338 - metronidazole  Additional action taken by pharmacy: none  If necessary, Name of Provider/Nurse Contacted: n/a  Fadumo Heng Rodriguez-Guzman PharmD, BCPS 02/24/2023 1:38 PM

## 2023-02-24 NOTE — Assessment & Plan Note (Addendum)
On initial evaluation, patient was notably struggling to speak and find his words.  Discussed with patient's RN, who states patient was much more coherent on arrival and difficulty finding words has developed since arriving to the ED 5 hours prior to examination.  Due to this, discussed with Dr. Leonel Ramsay and code stroke initiated.  CTA demonstrated multifocal short segment occlusions of the left P1 and P2.  Due to need for emergent thrombectomy, plan for transfer to [facility].  - Neurology following; appreciate their recommendations - Transfer to [facility] for emergent thrombectomy.  Dr. Leonel Ramsay as accepting physician. EMTALA completed.

## 2023-02-24 NOTE — Assessment & Plan Note (Signed)
On presentation, patient's hemoglobin was decreased at 6.6 compared to 10.1 approximately 2 months ago.  No obvious signs of bleeding on examination, however FOBT is positive.  Given Eliquis use, suspect GI related.  - 1 unit of packed RBCs ordered - Posttransfusion CBC - Protonix 40 mg IV twice daily - Consider consulting GI on arrival to [facility]

## 2023-02-24 NOTE — Progress Notes (Signed)
Briefly laid eyes on patient on arrival to [facility] for thrombectomy.  On my brief evaluation, he has aphasia. He can identify objects but is unable to speak a full sentence without getting stuck multiple times. 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  NIHSS components Score: Comment  1a Level of Conscious 0'[x]'$  1'[]'$  2'[]'$  3'[]'$      1b LOC Questions 0'[x]'$  1'[]'$  2'[]'$       1c LOC Commands 0'[x]'$  1'[]'$  2'[]'$       2 Best Gaze 0'[x]'$  1'[]'$  2'[]'$       3 Visual 0'[]'$  1'[x]'$  2'[]'$  3'[]'$      4 Facial Palsy 0'[x]'$  1'[]'$  2'[]'$  3'[]'$      5a Motor Arm - left 0'[x]'$  1'[]'$  2'[]'$  3'[]'$  4'[]'$  UN'[]'$    5b Motor Arm - Right 0'[x]'$  1'[]'$  2'[]'$  3'[]'$  4'[]'$  UN'[]'$    6a Motor Leg - Left 0'[x]'$  1'[]'$  2'[]'$  3'[]'$  4'[]'$  UN'[]'$    6b Motor Leg - Right 0'[x]'$  1'[]'$  2'[]'$  3'[]'$  4'[]'$  UN'[]'$    7 Limb Ataxia 0'[x]'$  1'[]'$  2'[]'$  3'[]'$  UN'[]'$     8 Sensory 0'[x]'$  1'[]'$  2'[]'$  UN'[]'$      9 Best Language 0'[]'$  1'[]'$  2'[x]'$  3'[]'$      10 Dysarthria 0'[x]'$  1'[]'$  2'[]'$  UN'[]'$      11 Extinct. and Inattention 0'[x]'$  1'[]'$  2'[]'$       TOTAL: 3     I spoke with patient's daughter Arrie Aran over phone with Dr. Estanislado Pandy sitting right next to me, to verify consent for thrombectomy and to see if she had any additional questions or concerns. Daughter reports feeling overwhelmed but expresses clear understanding of thrombectomy and denied additional questions.  Plan is to go forward with thrombectomy.  Given multiple ongoing medical issues, plan is for PCCM to be primary and we will be closely following for any questions.  Tat Momoli Pager Number [phone]

## 2023-02-24 NOTE — ED Notes (Signed)
Pt having trouble getting words out, speech is slurred and jumbled. Pt also now presenting with decreased sensation on right side and both legs. Dr. Charleen Kirks informed.

## 2023-02-24 NOTE — Progress Notes (Signed)
Elink following sepsis bundle. °

## 2023-02-24 NOTE — Assessment & Plan Note (Addendum)
On presentation, patient's troponin elevated at 2200.  He adamantly denies any chest pain and EKG is without any acute changes.  Cardiology consulted and recommending deferring heparin infusion given acute anemia and lack of symptoms and EKG changes.  [facility] consulted; appreciate their recommendations - Echocardiogram ordered - Repeat troponin pending - Stat EKG for any recurrence of chest pain

## 2023-02-24 NOTE — Procedures (Signed)
INR.  Status post left vertebral arteriogram.  Right CFA approach.  Findings.    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  High-grade stenosis of the left posterior cerebral artery P2 segment, in the P3 segment.  Status post endovascular revascularization of high-grade stenosis of the left vertebral junction with balloon angioplasty to a patency of 70%.  Post CT of the brain no evidence of intracranial hemorrhage.  8 French Angio-Seal closure device deployed at the right groin puncture site for hemostasis.  Distal pulses all dopplerable in both feet unchanged.  Patient kept intubated due to the patient's medical accommodation.  Medication utilized Integrilin approximately '8mg'$  intra-arterially after intracranial angioplasty  Arlean Hopping MD

## 2023-02-24 NOTE — Consult Note (Signed)
PHARMACY -  BRIEF ANTIBIOTIC NOTE   Pharmacy has received consult(s) for Cefepime and Vancomycin from an ED provider.  The patient's profile has been reviewed for ht/wt/allergies/indication/available labs.    One time order(s) placed for :  Cefepime 2gm IVPB x 1 Vancomycin 2000 mg IVPB x 1  Further antibiotics/pharmacy consults should be ordered by admitting physician if indicated.                       Thank you, Tyshawn Ciullo Rodriguez-Guzman PharmD, BCPS 02/24/2023 1:36 PM

## 2023-02-24 NOTE — ED Notes (Signed)
Pt asking to be pulled up in bed, pt was able to help some but was SOB after

## 2023-02-25 ENCOUNTER — Inpatient Hospital Stay (HOSPITAL_COMMUNITY): Payer: Medicare Other

## 2023-02-25 ENCOUNTER — Encounter (HOSPITAL_COMMUNITY): Payer: Self-pay | Admitting: Radiology

## 2023-02-25 DIAGNOSIS — I6312 Cerebral infarction due to embolism of basilar artery: Secondary | ICD-10-CM

## 2023-02-25 DIAGNOSIS — I7389 Other specified peripheral vascular diseases: Secondary | ICD-10-CM | POA: Diagnosis not present

## 2023-02-25 DIAGNOSIS — J9601 Acute respiratory failure with hypoxia: Secondary | ICD-10-CM

## 2023-02-25 DIAGNOSIS — I63532 Cerebral infarction due to unspecified occlusion or stenosis of left posterior cerebral artery: Secondary | ICD-10-CM

## 2023-02-25 DIAGNOSIS — I6389 Other cerebral infarction: Secondary | ICD-10-CM

## 2023-02-25 LAB — CBC WITH DIFFERENTIAL/PLATELET
Abs Immature Granulocytes: 0.21 10*3/uL — ABNORMAL HIGH (ref 0.00–0.07)
Basophils Absolute: 0 10*3/uL (ref 0.0–0.1)
Basophils Relative: 0 %
Eosinophils Absolute: 0 10*3/uL (ref 0.0–0.5)
Eosinophils Relative: 0 %
HCT: 19.8 % — ABNORMAL LOW (ref 39.0–52.0)
Hemoglobin: 7 g/dL — ABNORMAL LOW (ref 13.0–17.0)
Immature Granulocytes: 1 %
Lymphocytes Relative: 5 %
Lymphs Abs: 0.9 10*3/uL (ref 0.7–4.0)
MCH: 30.8 pg (ref 26.0–34.0)
MCHC: 35.4 g/dL (ref 30.0–36.0)
MCV: 87.2 fL (ref 80.0–100.0)
Monocytes Absolute: 1.2 10*3/uL — ABNORMAL HIGH (ref 0.1–1.0)
Monocytes Relative: 7 %
Neutro Abs: 15.4 10*3/uL — ABNORMAL HIGH (ref 1.7–7.7)
Neutrophils Relative %: 87 %
Platelets: 171 10*3/uL (ref 150–400)
RBC: 2.27 MIL/uL — ABNORMAL LOW (ref 4.22–5.81)
RDW: 17 % — ABNORMAL HIGH (ref 11.5–15.5)
WBC: 17.8 10*3/uL — ABNORMAL HIGH (ref 4.0–10.5)
nRBC: 0 % (ref 0.0–0.2)

## 2023-02-25 LAB — URINALYSIS, W/ REFLEX TO CULTURE (INFECTION SUSPECTED)
Bilirubin Urine: NEGATIVE
Glucose, UA: >=500 mg/dL — AB
Hgb urine dipstick: NEGATIVE
Ketones, ur: 5 mg/dL — AB
Leukocytes,Ua: NEGATIVE
Nitrite: NEGATIVE
Protein, ur: 30 mg/dL — AB
Specific Gravity, Urine: 1.028 (ref 1.005–1.030)
pH: 5 (ref 5.0–8.0)

## 2023-02-25 LAB — POCT I-STAT 7, (LYTES, BLD GAS, ICA,H+H)
Acid-base deficit: 7 mmol/L — ABNORMAL HIGH (ref 0.0–2.0)
Acid-base deficit: 7 mmol/L — ABNORMAL HIGH (ref 0.0–2.0)
Bicarbonate: 19.1 mmol/L — ABNORMAL LOW (ref 20.0–28.0)
Bicarbonate: 19.7 mmol/L — ABNORMAL LOW (ref 20.0–28.0)
Calcium, Ion: 1.12 mmol/L — ABNORMAL LOW (ref 1.15–1.40)
Calcium, Ion: 1.13 mmol/L — ABNORMAL LOW (ref 1.15–1.40)
HCT: 23 % — ABNORMAL LOW (ref 39.0–52.0)
HCT: 23 % — ABNORMAL LOW (ref 39.0–52.0)
Hemoglobin: 7.8 g/dL — ABNORMAL LOW (ref 13.0–17.0)
Hemoglobin: 7.8 g/dL — ABNORMAL LOW (ref 13.0–17.0)
O2 Saturation: 80 %
O2 Saturation: 92 %
Patient temperature: 96.4
Patient temperature: 96.4
Potassium: 4 mmol/L (ref 3.5–5.1)
Potassium: 4.1 mmol/L (ref 3.5–5.1)
Sodium: 134 mmol/L — ABNORMAL LOW (ref 135–145)
Sodium: 133 mmol/L — ABNORMAL LOW (ref 135–145)
TCO2: 20 mmol/L — ABNORMAL LOW (ref 22–32)
TCO2: 21 mmol/L — ABNORMAL LOW (ref 22–32)
pCO2 arterial: 38.8 mmHg (ref 32–48)
pCO2 arterial: 40.9 mmHg (ref 32–48)
pH, Arterial: 7.295 — ABNORMAL LOW (ref 7.35–7.45)
pH, Arterial: 7.285 — ABNORMAL LOW (ref 7.35–7.45)
pO2, Arterial: 46 mmHg — ABNORMAL LOW (ref 83–108)
pO2, Arterial: 67 mmHg — ABNORMAL LOW (ref 83–108)

## 2023-02-25 LAB — BPAM RBC
Blood Product Expiration Date: 202403282359
Unit Type and Rh: 6200

## 2023-02-25 LAB — TYPE AND SCREEN
ABO/RH(D): A POS
Antibody Screen: NEGATIVE
Unit division: 0

## 2023-02-25 LAB — GLUCOSE, CAPILLARY
Glucose-Capillary: 151 mg/dL — ABNORMAL HIGH (ref 70–99)
Glucose-Capillary: 154 mg/dL — ABNORMAL HIGH (ref 70–99)
Glucose-Capillary: 154 mg/dL — ABNORMAL HIGH (ref 70–99)
Glucose-Capillary: 157 mg/dL — ABNORMAL HIGH (ref 70–99)
Glucose-Capillary: 161 mg/dL — ABNORMAL HIGH (ref 70–99)
Glucose-Capillary: 162 mg/dL — ABNORMAL HIGH (ref 70–99)
Glucose-Capillary: 164 mg/dL — ABNORMAL HIGH (ref 70–99)
Glucose-Capillary: 168 mg/dL — ABNORMAL HIGH (ref 70–99)
Glucose-Capillary: 168 mg/dL — ABNORMAL HIGH (ref 70–99)
Glucose-Capillary: 169 mg/dL — ABNORMAL HIGH (ref 70–99)
Glucose-Capillary: 172 mg/dL — ABNORMAL HIGH (ref 70–99)
Glucose-Capillary: 205 mg/dL — ABNORMAL HIGH (ref 70–99)
Glucose-Capillary: 207 mg/dL — ABNORMAL HIGH (ref 70–99)
Glucose-Capillary: 208 mg/dL — ABNORMAL HIGH (ref 70–99)
Glucose-Capillary: 210 mg/dL — ABNORMAL HIGH (ref 70–99)
Glucose-Capillary: 212 mg/dL — ABNORMAL HIGH (ref 70–99)

## 2023-02-25 LAB — MRSA NEXT GEN BY PCR, NASAL: MRSA by PCR Next Gen: DETECTED — AB

## 2023-02-25 LAB — BASIC METABOLIC PANEL
Anion gap: 12 (ref 5–15)
BUN: 58 mg/dL — ABNORMAL HIGH (ref 8–23)
CO2: 19 mmol/L — ABNORMAL LOW (ref 22–32)
Calcium: 7.5 mg/dL — ABNORMAL LOW (ref 8.9–10.3)
Chloride: 103 mmol/L (ref 98–111)
Creatinine, Ser: 2.66 mg/dL — ABNORMAL HIGH (ref 0.61–1.24)
GFR, Estimated: 24 mL/min — ABNORMAL LOW (ref >60)
Glucose, Bld: 188 mg/dL — ABNORMAL HIGH (ref 70–99)
Potassium: 4.4 mmol/L (ref 3.5–5.1)
Sodium: 134 mmol/L — ABNORMAL LOW (ref 135–145)

## 2023-02-25 LAB — HEMOGLOBIN AND HEMATOCRIT, BLOOD
HCT: 24.9 % — ABNORMAL LOW (ref 39.0–52.0)
HCT: 28 % — ABNORMAL LOW (ref 39.0–52.0)
Hemoglobin: 8.7 g/dL — ABNORMAL LOW (ref 13.0–17.0)
Hemoglobin: 9.8 g/dL — ABNORMAL LOW (ref 13.0–17.0)

## 2023-02-25 LAB — COMPREHENSIVE METABOLIC PANEL
ALT: 18 U/L (ref 0–44)
AST: 26 U/L (ref 15–41)
Albumin: 2.9 g/dL — ABNORMAL LOW (ref 3.5–5.0)
Alkaline Phosphatase: 70 U/L (ref 38–126)
Anion gap: 11 (ref 5–15)
BUN: 61 mg/dL — ABNORMAL HIGH (ref 8–23)
CO2: 19 mmol/L — ABNORMAL LOW (ref 22–32)
Calcium: 7.4 mg/dL — ABNORMAL LOW (ref 8.9–10.3)
Chloride: 102 mmol/L (ref 98–111)
Creatinine, Ser: 2.77 mg/dL — ABNORMAL HIGH (ref 0.61–1.24)
GFR, Estimated: 23 mL/min — ABNORMAL LOW (ref >60)
Glucose, Bld: 273 mg/dL — ABNORMAL HIGH (ref 70–99)
Potassium: 4.5 mmol/L (ref 3.5–5.1)
Sodium: 132 mmol/L — ABNORMAL LOW (ref 135–145)
Total Bilirubin: 1.3 mg/dL — ABNORMAL HIGH (ref 0.3–1.2)
Total Protein: 5.9 g/dL — ABNORMAL LOW (ref 6.5–8.1)

## 2023-02-25 LAB — PREPARE RBC (CROSSMATCH)

## 2023-02-25 LAB — LIPID PANEL
Cholesterol: 126 mg/dL (ref 0–200)
HDL: 29 mg/dL — ABNORMAL LOW (ref >40)
LDL Cholesterol: 30 mg/dL (ref 0–99)
Total CHOL/HDL Ratio: 4.3 RATIO
Triglycerides: 337 mg/dL — ABNORMAL HIGH (ref <150)
VLDL: 67 mg/dL — ABNORMAL HIGH (ref 0–40)

## 2023-02-25 LAB — TROPONIN I (HIGH SENSITIVITY)
Troponin I (High Sensitivity): 2894 ng/L (ref <18)
Troponin I (High Sensitivity): 3251 ng/L (ref <18)

## 2023-02-25 LAB — ECHOCARDIOGRAM COMPLETE
Area-P 1/2: 3.13 cm2
Height: 72 in
S' Lateral: 3.5 cm
Weight: 4010.61 oz

## 2023-02-25 LAB — PHOSPHORUS: Phosphorus: 5.3 mg/dL — ABNORMAL HIGH (ref 2.5–4.6)

## 2023-02-25 LAB — LACTIC ACID, PLASMA
Lactic Acid, Venous: 1.8 mmol/L (ref 0.5–1.9)
Lactic Acid, Venous: 1.2 mmol/L (ref 0.5–1.9)

## 2023-02-25 LAB — MAGNESIUM: Magnesium: 2.9 mg/dL — ABNORMAL HIGH (ref 1.7–2.4)

## 2023-02-25 MED ORDER — FAMOTIDINE 20 MG PO TABS: 20.0000 mg | ORAL_TABLET | Freq: Two times a day (BID) | ORAL | Status: DC

## 2023-02-25 MED ORDER — PROPOFOL 1000 MG/100ML IV EMUL
INTRAVENOUS | Status: AC
  Filled 2023-02-25: qty 100

## 2023-02-25 MED ORDER — HYDRALAZINE HCL 20 MG/ML IJ SOLN
10.0000 mg | INTRAMUSCULAR | Status: DC | PRN
  Administered 2023-02-26: 10 mg via INTRAVENOUS
  Filled 2023-02-25: qty 1

## 2023-02-25 MED ORDER — FENTANYL 2500MCG IN NS 250ML (10MCG/ML) PREMIX INFUSION
25.0000 ug/h | INTRAVENOUS | Status: DC
  Administered 2023-02-25: 25 ug/h via INTRAVENOUS
  Administered 2023-02-26: 150 ug/h via INTRAVENOUS
  Administered 2023-02-26 (×2): 250 ug/h via INTRAVENOUS
  Administered 2023-02-27 (×2): 300 ug/h via INTRAVENOUS
  Administered 2023-02-28: 250 ug/h via INTRAVENOUS
  Filled 2023-02-25 (×7): qty 250

## 2023-02-25 MED ORDER — DOCUSATE SODIUM 50 MG/5ML PO LIQD
100.0000 mg | Freq: Two times a day (BID) | ORAL | Status: DC
  Administered 2023-02-25 – 2023-02-28 (×8): 100 mg
  Filled 2023-02-25 (×8): qty 10

## 2023-02-25 MED ORDER — PROSOURCE TF20 ENFIT COMPATIBL EN LIQD
60.0000 mL | Freq: Every day | ENTERAL | Status: DC
  Administered 2023-02-25 – 2023-03-01 (×5): 60 mL
  Filled 2023-02-25 (×5): qty 60

## 2023-02-25 MED ORDER — VITAL AF 1.2 CAL PO LIQD
1000.0000 mL | ORAL | Status: DC
  Administered 2023-02-25 – 2023-03-01 (×5): 1000 mL

## 2023-02-25 MED ORDER — DEXTROSE IN LACTATED RINGERS 5 % IV SOLN: INTRAVENOUS | Status: DC

## 2023-02-25 MED ORDER — MUPIROCIN 2 % EX OINT
1.0000 | TOPICAL_OINTMENT | Freq: Two times a day (BID) | CUTANEOUS | Status: AC
  Administered 2023-02-25 – 2023-03-01 (×10): 1 via NASAL
  Filled 2023-02-25: qty 22

## 2023-02-25 MED ORDER — SODIUM CHLORIDE 0.9% IV SOLUTION: Freq: Once | INTRAVENOUS | Status: AC

## 2023-02-25 MED ORDER — FENTANYL CITRATE PF 50 MCG/ML IJ SOSY
25.0000 ug | PREFILLED_SYRINGE | Freq: Once | INTRAMUSCULAR | Status: AC
  Administered 2023-02-25: 25 ug via INTRAVENOUS

## 2023-02-25 MED ORDER — FENTANYL BOLUS VIA INFUSION
25.0000 ug | INTRAVENOUS | Status: DC | PRN
  Administered 2023-02-25 – 2023-02-27 (×2): 100 ug via INTRAVENOUS

## 2023-02-25 MED ORDER — POLYETHYLENE GLYCOL 3350 17 G PO PACK
17.0000 g | PACK | Freq: Every day | ORAL | Status: DC
  Administered 2023-02-25 – 2023-02-26 (×2): 17 g
  Filled 2023-02-25 (×2): qty 1

## 2023-02-25 MED ORDER — PROPOFOL 1000 MG/100ML IV EMUL
5.0000 ug/kg/min | INTRAVENOUS | Status: DC
  Administered 2023-02-25 (×3): 40 ug/kg/min via INTRAVENOUS
  Administered 2023-02-25: 20 ug/kg/min via INTRAVENOUS
  Administered 2023-02-25 – 2023-02-26 (×3): 40 ug/kg/min via INTRAVENOUS
  Filled 2023-02-25 (×6): qty 100

## 2023-02-25 MED ORDER — CLEVIDIPINE BUTYRATE 0.5 MG/ML IV EMUL
0.0000 mg/h | INTRAVENOUS | Status: DC
  Administered 2023-02-25: 18 mg/h via INTRAVENOUS
  Administered 2023-02-26: 16 mg/h via INTRAVENOUS
  Administered 2023-02-26: 21 mg/h via INTRAVENOUS
  Administered 2023-02-26: 18 mg/h via INTRAVENOUS
  Administered 2023-02-26: 16 mg/h via INTRAVENOUS
  Administered 2023-02-26 (×2): 21 mg/h via INTRAVENOUS
  Filled 2023-02-25 (×7): qty 50

## 2023-02-25 NOTE — Progress Notes (Signed)
PCCM note  CT abdomen pelvis reviewed with subcapsular hematoma with extension into the retroperitoneal space, acute fractures of 11th and 12th ribs.  This may be related to fall before admission.  Radiologist is questioning recent renal biopsy but there is no evidence of this in the chart He is currently hemodynamically stable.  Hemoglobin is 9.1 Monitor labs  Marshell Garfinkel MD [place] See Amion for pager  If no response to pager , please call [phone] until 7pm After 7:00 pm call Elink  [phone] 02/25/2023, 1:16 AM

## 2023-02-25 NOTE — Progress Notes (Signed)
NAME:  [name], MRN:  [id number], DOB:  [birth year], LOS: 1 ADMISSION DATE:  02/23/2023, CONSULTATION DATE:  02/16/2023 REFERRING MD: Lorrin Goodell - Neuro CHIEF COMPLAINT: Acute stroke s/p thrombectomy  History of Present Illness:   77 year old man who initially presented to [facility] 2/28 for 2-3 days of weakness. S/p fall at home 2 days PTA. Transferred to [facility] 2/28PM for mechanical thrombectomy. Also with hyperkalemia in the setting of AKI and DKA. PMHx significant for HTN, HLD, CAD (s/p CABG x 3 2017), HFpEF (Echo 01/2023 with EF 50-55%), AF (on Eliquis), CVA (R pontine stroke, cerebellar stroke), T2DM, BPH, depression.  At [facility], patient was noted to have difficulty answering questions and word-finding. Patient's son reported a fall at home 2-3 days PTA that has limited patient's mobility due to pain; he felt there was a possibility patient had not been taking his medications. At baseline, patient lives alone and is independent with all ADLs. Labs at [facility] were notable for pH 7.32, pCO2 33, CBG 787. WBC 17.6, Hgb 6.6. Na 122, K 6.3, bicarb 16 (AG 14), BUN/Cr 70/2.97. BNP 685, Trop 2280. B-hydroxybutyrate 3.77, LA 2.7. UA +glucose, ketones, protein. C/f DKA and NSTEMI, insulin gtt started and Cards consulted with recommendation to defer [facility] in the setting of Hgb drift, ?bleed. Code Stroke was called for AMS and CT Head NAICA, consistent with prior infarcts. CTA Head/Neck showed L PCA (P1 P2) occlusions, L carotid plaque (70% stenosis). Decision for transfer to [facility] for emergent mechanical thrombectomy.  Underwent endovascular revascularization of high-grade L vertebral junction with balloon angioplasty (patency 70%) with NIR (Deveshwar) 2/28PM; post-procedure CT without ICH. Repeat CT Head completed 2/29 as patient was slow to wake from sedation, NAICA/stable. CT A/P was completed 2/29AM in the setting of Hgb drift with unknown cause, demonstrating moderate R renal subcapsular hematoma with extension  of hemorrhage into the posterior pararenal space and rib fractures (R 11th and 12th ribs); suspect this is related to recent fall at home PTA.  Remains critically in in 4N Neuro ICU.  Pertinent Medical History:   Past Medical History:  Diagnosis Date   Atrial fibrillation ([place])    a.) CHA2DS2VASc = 6 (age x 2,  HTN, CVA/TIA x 2, T2DM);  b.) s/p DCCV 09/25/2020 (120 J x1); c.) rate/rhythm maintained on oral metoprolol tartrate; chronically anticoagulated with apixaban   BPH (benign prostatic hyperplasia)    CAD (coronary artery disease) 10/28/2016   a.) LHC 10/28/2016: 95% dLAD, 90% p-mLCx, 75% mLAD, 60% m-dLAD, 99% p-mRCA, 75% dRCA --> consult CVTS; b.) 3v CABG at [facility] 11/05/2016   Carotid artery disease ([place]) 06/29/2016   a.) carotid US 06/29/2016: <50% BICA   Cerebellar stroke ([place]) 11/13/2015   a.) CT imaging 11/13/2015 revealed old/remote LEFT cerebellar and LEFT carona radiata infarcts   CHF (congestive heart failure) ([facility])    Depression    Diabetic foot ulcers ([facility])    Diastolic dysfunction Q000111Q   a.) TTE 06/28/2016: EF 55-60%, G1DD; b.) TTE 03/19/2020: EF 55-60%, mild BAE, mild-mod MR/TR.   HLD (hyperlipidemia)    Hypertension    Long term (current) use of anticoagulants    a.) apixaban   Long term current use of antithrombotics/antiplatelets    a.) clopidogrel   Neuropathy    PTSD (post-traumatic stress disorder)    Restless leg syndrome    a.) on ropinirole   Right pontine stroke ([place]) 06/28/2016   a.) MRI 06/28/2016: acute RIGHT paracentral pontine (non-hemorrhagic) CVA   S/P CABG x 3 11/05/2016  a.) LIMA-LAD, SVG-OM1, SVG-PDA   TIA (transient ischemic attack)    a.) multiple since last CVA in 2017 per daughter   Tubular adenoma of colon    Type 2 diabetes mellitus treated with insulin ([place])    Significant Hospital Events: Including procedures, antibiotic start and stop dates in addition to other pertinent events   2/28 - Presented to [facility] for weakness,  AMS. Found to be in DKA with c/f recurrent CVA (L PCA?). Also with NSTEMI, elevated trop. Taken to [facility] for ?thrombectomy, underwent endovascular revasc/balloon angioplasty of L vertebral/PCA, no stent. 2/29 - Slow to wake up post-sedation wean, repeat CT Head NAICA. Hgb 6.6, CT A/P with renal subcapsular hematoma/extension into posterior RP space, rib fx on R (11/12). Hgb 7.0, 2U PRBCs ordered. Remains intubated, sedated on insulin gtt. TF started.  Interim History / Subjective:  Transferred overnight from [facility], events as above Remains intubated, sedated; appears uncomfortable Added Fent gtt Vent requirements down, FiO2 70%, PEEP 12 Hgb 7.0, 2U PRBCs ordered Plan to start TF  Objective:  Blood pressure (!) 103/56, pulse 69, temperature (!) 96.7 F (35.9 C), resp. rate 20, weight 113.7 kg, SpO2 100 %.    Vent Mode: PRVC FiO2 (%):  [80 %-100 %] 100 % Set Rate:  [18 bmp] 18 bmp Vt Set:  [620 mL] 620 mL PEEP:  [5 cmH20-14 cmH20] 14 cmH20 Plateau Pressure:  [19 cmH20-20 cmH20] 19 cmH20   Intake/Output Summary (Last 24 hours) at 02/25/2023 0729 Last data filed at 02/25/2023 0700 Gross per 24 hour  Intake 3983.08 ml  Output 1090 ml  Net 2893.08 ml    Filed Weights   01/29/2023 2300 02/25/23 0530  Weight: 110.5 kg 113.7 kg   Physical Examination: General: Acutely ill-appearing elderly man in NAD. Intubated, sedated - appears mildly uncomfortable. HEENT: [place]/AT, anicteric sclera, PERRL 68m , moist mucous membranes. ETT/OGT in place. Neuro: Sedated. Does not respond to verbal, tactile or noxious stimuli. Withdraws to pain readily in upper extremities, slower in lower extremities. Not following commands. Moves all 4 extremities spontaneously. Resists eye opening for pupillary checks. +Corneal, +Cough, and +Gag  CV: RRR, no m/g/r. PULM: Breathing even and unlabored on vent (PEEP 12, FiO2 70%). Lung fields CTAB. GI: Obese, soft, mildly distended, TTP on R. Hypoactive bowel sounds. Extremities:  Bilateral symmetric 1+ LE edema noted, worse in feet bilaterally. +DP pulses on doppler. Skin: Warm/dry, no rashes. Mild ecchymosis lateral RLQ.  Resolved Hospital Problem List:     Assessment & Plan:  Acute PCA stroke s/p endovascular revascularization/balloon angioplasty, no stent - Neuro/Stroke following, appreciate recommendations - NIR following post-procedure - Goal SBP 120-140 - Cleviprex titrated to goal SBP - MRI Brain when feasible - Frequent neuro checks - Neuroprotective measures: HOB > 30 degrees, normoglycemia, normothermia, electrolytes WNL - PT/OT/SLP when able to participate in care  Acute hypoxemic respiratory failure, need for mechanical ventilation in the setting of AMS - Continue full vent support (4-8cc/kg IBW) - Wean FiO2 for O2 sat > 90% - Daily WUA/SBT once clinically appropriate from a mental status standpoint - VAP bundle - Pulmonary hygiene - PAD protocol for sedation: Propofol and Fentanyl for goal RASS 0 to -1 - Follow CXR  DKA T2DM - Insulin gtt via EndoTool - IVF D5LR - Transition to SQ insulin once stabilized - CBGs Q2H while on gtt  Elevated troponin CAD s/p CABG x 3 (2017) Acute on chronic diastolic heart failure Paroxysmal atrial fibrillation - Cardiology consulted, appreciate recommendations - Troponin likely  demand ischemia-related; cannot r/o ACS given cardiac history - Echo with EF 50-55%, +RWMAs, mild concentric LVH - Continue ASA, statin - Hold Eliquis in the setting of subcapsular hematoma/RP bleed - Cardiac monitoring  AKI in setting of stage II CKD, also with subcapsular hematoma Hyperkalemia, improved - Trend BMP - Replete electrolytes as indicated - Monitor I&Os - Avoid nephrotoxic agents as able - Ensure adequate renal perfusion - Foley for acute urinary retention/critical illness  Leukocytosis, likely reactive in the setting of multiple insults - Goal MAP > 65 - Fluid resuscitation as tolerated - Trend WBC, fever  curve, LA - F/u Cx data - Monitor off of antibiotics  ABLA in the setting of subcapsular hematoma/RP bleed ?GIB S/p 3 units PRBC [facility], 2 additional today 2/29. - Trend H&H - Monitor for signs of active bleeding - Transfuse for Hgb < 8.0 (given cardiac history) or hemodynamically significant bleeding - Eliquis on hold - Low threshold for repeat abdominal imaging - PPI BID  Right 11th and 12th rib fractures - Supportive care/pain control  Best Practice: (right click and "Reselect all SmartList Selections" daily)   Diet/type: NPO DVT prophylaxis: SCD GI prophylaxis: PPI Lines: N/A Foley:  N/A Code Status:  full code Last date of multidisciplinary goals of care discussion [Pending]  Critical care time:    The patient is critically ill with multiple organ system failure and requires high complexity decision making for assessment and support, frequent evaluation and titration of therapies, advanced monitoring, review of radiographic studies and interpretation of complex data.   Critical Care Time devoted to patient care services, exclusive of separately billable procedures, described in this note is 41 minutes.  Lestine Mount, PA-C [facility] 02/25/23 7:30 AM  Please see [email or web address] for pager details.  From 7A-7P if no response, please call [phone] After hours, please call ELink [phone]

## 2023-02-25 NOTE — Progress Notes (Signed)
Rounding Note    Patient Name: [name] Date of Encounter: 02/25/2023   Subjective   Patient intubated, sedated   Inpatient Medications    Scheduled Meds:  aspirin  81 mg Oral Daily   Or   aspirin  81 mg Per Tube Daily   Chlorhexidine Gluconate Cloth  6 each Topical Daily   docusate  100 mg Per Tube BID   feeding supplement (PROSource TF20)  60 mL Per Tube Daily   mupirocin ointment  1 Application Nasal BID   mouth rinse  15 mL Mouth Rinse Q2H   pantoprazole (PROTONIX) IV  40 mg Intravenous Q12H   polyethylene glycol  17 g Per Tube Daily   Continuous Infusions:  clevidipine 14 mg/hr (02/25/23 1411)   dextrose 5% lactated ringers 100 mL/hr at 02/25/23 1200   feeding supplement (VITAL AF 1.2 CAL)     fentaNYL infusion INTRAVENOUS 100 mcg/hr (02/25/23 1200)   insulin 4.2 mL/hr at 02/25/23 1200   propofol (DIPRIVAN) infusion 20 mcg/kg/min (02/25/23 1409)    Vital Signs    Vitals:   02/25/23 1400 02/25/23 1444 02/25/23 1445 02/25/23 1500  BP: (!) 110/59   (!) 109/58  Pulse: 74 75 76 76  Resp: '18 18 18 18  '$ Temp:   98.2 F (36.8 C) 98.1 F (36.7 C)  TempSrc:   Axillary Axillary  SpO2: 97% 97% 97% 98%  Weight:        Intake/Output Summary (Last 24 hours) at 02/25/2023 1532 Last data filed at 02/25/2023 1235 Gross per 24 hour  Intake 5154.6 ml  Output 1415 ml  Net 3739.6 ml      02/25/2023    5:30 AM 02/09/2023   11:00 PM 02/19/2023   11:40 AM  Last 3 Weights  Weight (lbs) 250 lb 10.6 oz 243 lb 9.7 oz 237 lb 11.2 oz  Weight (kg) 113.7 kg 110.5 kg 107.82 kg      Telemetry    SR/ST - Personally Reviewed  ECG    SR  RBBB  ST depression inferior and laterally  Less prominent than last night  - Personally Reviewed  Physical Exam   GEN: PT intubated  Neck: Difficult to assess JVP    Cardiac: RRR, no murmurs, Respiratory: Clear to auscultation  anteriorly  GI: Soft, non-distended  MS: No edema;  Labs    High Sensitivity Troponin:    Recent Labs  Lab 02/23/2023 1143 02/03/2023 2320 02/25/23 0213  TROPONINIHS 2,280* 2,894* 3,251*     Chemistry Recent Labs  Lab 02/23/2023 1143 02/21/2023 1421 02/19/2023 1806 01/28/2023 2013 02/03/2023 2022 01/31/2023 2202 01/28/2023 2320 02/25/23 0403 02/25/23 0444 02/25/23 0537  NA 122*   < > 129*   < > 128*   < > 132* 134* 133* 134*  K 6.3*   < > 4.7   < > 4.9   < > 4.5 4.0 4.1 4.4  CL 92*   < > 98  --  97*  --  102  --   --  103  CO2 16*   < > 19*  --   --   --  19*  --   --  19*  GLUCOSE 787*   < > 304*  --  311*  --  273*  --   --  188*  BUN 70*   < > 71*  --  66*  --  61*  --   --  58*  CREATININE 2.97*   < > 3.02*  --  2.70*  --  2.77*  --   --  2.66*  CALCIUM 7.6*   < > 7.9*  --   --   --  7.4*  --   --  7.5*  PROT 6.5  --   --   --   --   --  5.9*  --   --   --   ALBUMIN 3.3*  --   --   --   --   --  2.9*  --   --   --   AST 27  --   --   --   --   --  26  --   --   --   ALT 20  --   --   --   --   --  18  --   --   --   ALKPHOS 77  --   --   --   --   --  70  --   --   --   BILITOT 1.5*  --   --   --   --   --  1.3*  --   --   --   GFRNONAA 21*   < > 20*  --   --   --  23*  --   --  24*  ANIONGAP 14   < > 12  --   --   --  11  --   --  12   < > = values in this interval not displayed.    Lipids  Recent Labs  Lab 02/25/23 0537  CHOL 126  TRIG 337*  HDL 29*  LDLCALC 30  CHOLHDL 4.3    Hematology Recent Labs  Lab 02/06/2023 1143 02/07/2023 2013 01/29/2023 2320 02/25/23 0403 02/25/23 0444 02/25/23 0537 02/25/23 1333  WBC 17.6*  --  15.8*  --   --  17.8*  --   RBC 2.36*  --  3.10*  --   --  2.27*  --   HGB 6.6*   < > 9.1*   < > 7.8* 7.0* 8.7*  HCT 20.4*   < > 27.0*   < > 23.0* 19.8* 24.9*  MCV 86.4  --  87.1  --   --  87.2  --   MCH 28.0  --  29.4  --   --  30.8  --   MCHC 32.4  --  33.7  --   --  35.4  --   RDW 18.6*  --  17.1*  --   --  17.0*  --   PLT 171  --  164  --   --  171  --    < > = values in this interval not displayed.   Thyroid No results for  input(s): "TSH", "FREET4" in the last 168 hours.  BNP Recent Labs  Lab 02/07/2023 1143  BNP 685.7*    DDimer No results for input(s): "DDIMER" in the last 168 hours.   Radiology    ECHOCARDIOGRAM COMPLETE  Result Date: 02/25/2023    ECHOCARDIOGRAM REPORT   Patient Name:   [name] Date of Exam: 02/25/2023 Medical Rec #:  [id number]             Height:       72.0 in Accession #:    [id number]            Weight:       250.7 lb Date of Birth:  [birth year]             BSA:          2.345 m Patient Age:    77 years              BP:           107/53 mmHg Patient Gender: M                     HR:           73 bpm. Exam Location:  Inpatient Procedure: 2D Echo, Cardiac Doppler and Color Doppler Indications:    Stroke  History:        Patient has prior history of Echocardiogram examinations. CAD,                 Prior CABG, Arrythmias:Atrial Fibrillation; Risk                 Factors:Hypertension and Diabetes.  Sonographer:    Danne Baxter RDCS, FE, PE Referring Phys: [id number] [place]  1. Left ventricular ejection fraction, by estimation, is 50 to 55%. The left ventricle has low normal function. The left ventricle demonstrates regional wall motion abnormalities (see scoring diagram/findings for description). There is mild concentric left ventricular hypertrophy. Left ventricular diastolic parameters are consistent with Grade II diastolic dysfunction (pseudonormalization).  2. Right ventricular systolic function is normal. The right ventricular size is normal.  3. The mitral valve is normal in structure. No evidence of mitral valve regurgitation. No evidence of mitral stenosis.  4. The aortic valve is normal in structure. Aortic valve regurgitation is not visualized. Aortic valve sclerosis is present, with no evidence of aortic valve stenosis.  5. There is borderline dilatation of the ascending aorta, measuring 36 mm.  6. The inferior vena cava is normal in size with greater than 50%  respiratory variability, suggesting right atrial pressure of 3 mmHg. FINDINGS  Left Ventricle: Left ventricular ejection fraction, by estimation, is 50 to 55%. The left ventricle has low normal function. The left ventricle demonstrates regional wall motion abnormalities. The left ventricular internal cavity size was normal in size. There is mild concentric left ventricular hypertrophy. Left ventricular diastolic parameters are consistent with Grade II diastolic dysfunction (pseudonormalization).  LV Wall Scoring: The basal inferolateral segment and mid inferior segment are akinetic. The entire anterior wall, antero-lateral wall, mid and distal lateral wall, entire septum, entire apex, and basal inferior segment are hypokinetic. Right Ventricle: The right ventricular size is normal. No increase in right ventricular wall thickness. Right ventricular systolic function is normal. Left Atrium: Left atrial size was normal in size. Right Atrium: Right atrial size was normal in size. Pericardium: There is no evidence of pericardial effusion. Presence of epicardial fat layer. Mitral Valve: The mitral valve is normal in structure. No evidence of mitral valve regurgitation. No evidence of mitral valve stenosis. Tricuspid Valve: The tricuspid valve is normal in structure. Tricuspid valve regurgitation is mild . No evidence of tricuspid stenosis. Aortic Valve: The aortic valve is normal in structure. Aortic valve regurgitation is not visualized. Aortic valve sclerosis is present, with no evidence of aortic valve stenosis. Pulmonic Valve: The pulmonic valve was normal in structure. Pulmonic valve regurgitation is not visualized. No evidence of pulmonic stenosis. Aorta: The aortic root is normal in size and structure. There is borderline dilatation of the ascending aorta, measuring 36 mm. Venous: The inferior vena cava is normal in size with greater than 50%  respiratory variability, suggesting right atrial pressure of 3 mmHg.  IAS/Shunts: No atrial level shunt detected by color flow Doppler.  LEFT VENTRICLE PLAX 2D LVIDd:         5.20 cm   Diastology LVIDs:         3.50 cm   LV e' medial:    8.08 cm/s LV PW:         1.00 cm   LV E/e' medial:  14.2 LV IVS:        1.20 cm   LV e' lateral:   9.48 cm/s LVOT diam:     2.10 cm   LV E/e' lateral: 12.1 LV SV:         84 LV SV Index:   36 LVOT Area:     3.46 cm  RIGHT VENTRICLE RV S prime:     9.17 cm/s TAPSE (M-mode): 1.3 cm LEFT ATRIUM             Index        RIGHT ATRIUM           Index LA diam:        3.70 cm 1.58 cm/m   RA Area:     19.40 cm LA Vol (A2C):   42.7 ml 18.21 ml/m  RA Volume:   51.60 ml  22.00 ml/m LA Vol (A4C):   70.1 ml 29.89 ml/m LA Biplane Vol: 56.0 ml 23.88 ml/m  AORTIC VALVE LVOT Vmax:   122.00 cm/s LVOT Vmean:  80.800 cm/s LVOT VTI:    0.243 m  AORTA Ao Root diam: 3.30 cm Ao Asc diam:  3.60 cm MITRAL VALVE                TRICUSPID VALVE MV Area (PHT): 3.13 cm     TR Peak grad:   12.0 mmHg MV Decel Time: 242 msec     TR Vmax:        173.00 cm/s MV E velocity: 115.00 cm/s MV A velocity: 90.30 cm/s   SHUNTS MV E/A ratio:  1.27         Systemic VTI:  0.24 m                             Systemic Diam: 2.10 cm Kardie Tobb DO Electronically signed by Berniece Salines DO Signature Date/Time: 02/25/2023/10:21:33 AM    Final    DG Chest Port 1 View  Result Date: 02/25/2023 CLINICAL DATA:  Acute respiratory failure EXAM: PORTABLE CHEST 1 VIEW COMPARISON:  02/05/2023 FINDINGS: Endotracheal tube with tip at the clavicular heads. An enteric tube reaches the stomach. Artifact from EKG leads. Elevated right diaphragm with hazy infiltrate at the right base. No edema, effusion, or pneumothorax. Cardiomegaly.  Prior CABG. IMPRESSION: Stable hardware positioning and volume loss at the right lung base. Electronically Signed   By: Jorje Guild M.D.   On: 02/25/2023 05:27   CT HEAD WO CONTRAST (5MM)  Result Date: 02/25/2023 CLINICAL DATA:  Initial evaluation for acute mental status  change. EXAM: CT HEAD WITHOUT CONTRAST TECHNIQUE: Contiguous axial images were obtained from the base of the skull through the vertex without intravenous contrast. RADIATION DOSE REDUCTION: This exam was performed according to the departmental dose-optimization program which includes automated exposure control, adjustment of the mA and/or kV according to patient size and/or use of iterative reconstruction technique. COMPARISON:  Prior studies from 02/04/2023. FINDINGS: Brain: Mild age-related cerebral atrophy. Remote ischemic change  at the left basal ganglia/corona radiata. 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 No acute intracranial hemorrhage. No visible acute large vessel territory infarct. No mass lesion or midline shift. No hydrocephalus or extra-axial fluid collection. Vascular: Contrast material remains on board from recent procedures. Scattered calcified atherosclerosis present about the skull base. Skull: Scalp soft tissues and calvarium demonstrate no new finding. Sinuses/Orbits: Globes orbital soft tissues within normal limits. Scattered mucosal thickening present throughout the paranasal sinuses. Patient is intubated. Mastoid air cells remain clear. Other: None. IMPRESSION: 1. No acute intracranial abnormality. 2. Chronic ischemic changes involving the left basal ganglia/corona radiata and left greater than right cerebellum, stable. Electronically Signed   By: Jeannine Boga M.D.   On: 02/25/2023 02:30   CT ABDOMEN PELVIS WO CONTRAST  Addendum Date: 02/25/2023   ADDENDUM REPORT: 02/25/2023 01:09 ADDENDUM: These results were called by telephone at the time of interpretation on 02/25/2023 at 1:09 am to provider [facility] , who verbally acknowledged these results. Electronically Signed   By: Fidela Salisbury M.D.   On: 02/25/2023 01:09   Result Date: 02/25/2023 CLINICAL DATA:  Retroperitoneal hemorrhage EXAM: CT ABDOMEN AND PELVIS WITHOUT CONTRAST TECHNIQUE: Multidetector CT  imaging of the abdomen and pelvis was performed following the standard protocol without IV contrast. RADIATION DOSE REDUCTION: This exam was performed according to the departmental dose-optimization program which includes automated exposure control, adjustment of the mA and/or kV according to patient size and/or use of iterative reconstruction technique. COMPARISON:  None Available. FINDINGS: Lower chest: Bibasilar atelectasis. Extensive coronary artery calcification. Nasogastric tube tip extends into the proximal body of the stomach. Hepatobiliary: No focal liver abnormality is seen. No gallstones, gallbladder wall thickening, or biliary dilatation. Pancreas: Unremarkable Spleen: Unremarkable Adrenals/Urinary Tract: The adrenal glands are unremarkable. The kidneys are normal in position. Contrast enhancement of the renal cortices and contrast within the renal collecting system is related to recent CT arteriography and endovascular procedure. A moderate right subcapsular hematoma is seen involving the mid lower pole the right kidney with moderate extension of hemorrhage into the posterior pararenal space. There are, additionally, punctate foci of gas along the posteroinferior aspect of the subcapsular hematoma suggesting possible intervention, as can be seen with recent renal biopsy, or, less likely, superimposed infection. The left kidney is unremarkable. The bladder is decompressed and is unremarkable. Stomach/Bowel: Stomach is within normal limits. Appendix appears normal. No evidence of bowel wall thickening, distention, or inflammatory changes. Vascular/Lymphatic: Aortic atherosclerosis. No enlarged abdominal or pelvic lymph nodes. Reproductive: Prostate is unremarkable. Other: Mild asymmetric subcutaneous edema within the right flank may relate to trauma or dependent positioning. No abdominal wall hernia. Musculoskeletal: Acute fractures of the right eleventh and twelfth ribs are identified posteriorly.  Moderate L1 compression deformity with 40-50% loss of height is seen likely remote in nature. No associated retropulsion. Degenerative changes are seen within the visualized thoracolumbar spine. IMPRESSION: 1. Moderate right renal subcapsular hematoma with moderate extension of hemorrhage into the posterior pararenal space. Punctate foci of gas along the posteroinferior aspect of the subcapsular hematoma suggesting possible intervention, as can be seen with recent renal biopsy, or, less likely, superimposed infection. Correlation for history of recent biopsy is recommended. 2. Acute fractures of the right eleventh and twelfth ribs posteriorly. 3. Extensive coronary artery calcification. 4. Moderate L1 compression deformity, likely remote in nature. 5. Aortic atherosclerosis. Aortic Atherosclerosis (ICD10-I70.0). Electronically Signed: By: Fidela Salisbury M.D. On: 02/25/2023 01:00   DG CHEST PORT 1 VIEW  Result Date: 02/25/2023 CLINICAL  DATA:  Feeding tube placement.  Acute respiratory failure. EXAM: PORTABLE CHEST 1 VIEW, ABDOMEN PORTABLE ONE VIEW COMPARISON:  02/15/2023. FINDINGS: Heart is enlarged in the mediastinal contour is stable. The pulmonary vasculature is distended. Lung volumes are low with minimal atelectasis at the lung bases bilaterally. There is a small right pleural effusion. No pneumothorax. Sternotomy wires are noted over the midline. The endotracheal tube terminates 4.9 cm above the carina. There is no evidence of bowel obstruction on limited view of the upper abdomen. The enteric tube terminates in the proximal stomach. IMPRESSION: 1. Cardiomegaly with mildly distended pulmonary vasculature. 2. Small right pleural effusion with atelectasis at the right lung base. 3. Support apparatus as detailed above. Electronically Signed   By: Brett Fairy M.D.   On: 02/25/2023 00:29   DG Abd Portable 1V  Result Date: 02/25/2023 CLINICAL DATA:  Feeding tube placement.  Acute respiratory failure. EXAM:  PORTABLE CHEST 1 VIEW, ABDOMEN PORTABLE ONE VIEW COMPARISON:  02/23/2023. FINDINGS: Heart is enlarged in the mediastinal contour is stable. The pulmonary vasculature is distended. Lung volumes are low with minimal atelectasis at the lung bases bilaterally. There is a small right pleural effusion. No pneumothorax. Sternotomy wires are noted over the midline. The endotracheal tube terminates 4.9 cm above the carina. There is no evidence of bowel obstruction on limited view of the upper abdomen. The enteric tube terminates in the proximal stomach. IMPRESSION: 1. Cardiomegaly with mildly distended pulmonary vasculature. 2. Small right pleural effusion with atelectasis at the right lung base. 3. Support apparatus as detailed above. Electronically Signed   By: Brett Fairy M.D.   On: 02/25/2023 00:29   CT ANGIO HEAD NECK W WO CM W PERF (CODE STROKE)  Result Date: 02/08/2023 CLINICAL DATA:  Concern for left MCA or PCA stroke. Decline in speech and right-sided weakness. EXAM: CT ANGIOGRAPHY HEAD AND NECK CT PERFUSION BRAIN TECHNIQUE: Multidetector CT imaging of the head and neck was performed using the standard protocol during bolus administration of intravenous contrast. Multiplanar CT image reconstructions and MIPs were obtained to evaluate the vascular anatomy. Carotid stenosis measurements (when applicable) are obtained utilizing NASCET criteria, using the distal internal carotid diameter as the denominator. Multiphase CT imaging of the brain was performed following IV bolus contrast injection. Subsequent parametric perfusion maps were calculated using RAPID software. RADIATION DOSE REDUCTION: This exam was performed according to the departmental dose-optimization program which includes automated exposure control, adjustment of the mA and/or kV according to patient size and/or use of iterative reconstruction technique. CONTRAST:  146m OMNIPAQUE IOHEXOL 350 MG/ML SOLN COMPARISON:  Same-day CT head.  Carotid Doppler  03/19/2020. FINDINGS: CTA NECK FINDINGS Aortic arch: There is mild calcified plaque in the imaged aortic arch. The origins of the major branch vessels are patent. The subclavian arteries are patent to the level imaged. Right carotid system: The right common, internal, and external carotid arteries are patent with calcified plaque at the bifurcation resulting in less than 50% stenosis of the proximal internal carotid artery. The distal internal carotid artery is patent the external carotid artery is patent. There is no dissection or aneurysm. Left carotid system: The left common carotid artery is patent. There is bulky plaque at the bifurcation resulting in up to approximately 70% stenosis. The distal internal carotid artery is patent. The external carotid artery is patent. There is no evidence of dissection or aneurysm. Vertebral arteries: The vertebral arteries are patent, without hemodynamically significant stenosis or occlusion. There is no evidence of dissection or  aneurysm Skeleton: There is no acute osseous abnormality or suspicious osseous lesion. There is no visible canal hematoma. Other neck: The soft tissues of the neck are unremarkable. Upper chest: The imaged lung apices are clear. Review of the MIP images confirms the above findings CTA HEAD FINDINGS Anterior circulation: There is extensive calcified plaque in the carotid siphons resulting severe stenosis bilaterally. The left M1 segment is patent. There is short-segment severe stenosis of left M2 branches in the sylvian fissure proximally (8-117). There is no evidence of occlusion. There is moderate stenosis of the distal right M1 segment. There is focal moderate stenosis of a right M2 branch in the sylvian fissure (8-123). The distal branches are otherwise patent, without other proximal high-grade stenosis or occlusion. There is severe stenosis of the origin of the right A1 segment (6-162). Otherwise, the ACAs are patent, without other proximal  high-grade stenosis or occlusion. The anterior communicating artery is normal. There is no aneurysm or AVM. Posterior circulation: There is calcified plaque in the bilateral V4 segments resulting severe stenosis bilaterally. The PICA origins are not identified. The basilar artery is patent with moderate stenosis distally [id number]). There is multifocal short-segment occlusion of the left P1 and P2 segments (8-134, 141). There is intermittent diminutive reconstitution of flow in the distal branches. There is a fetal origin of the right PCA. There is severe stenosis of the proximal P2 segment (8-137). There is multifocal irregularity and narrowing of the distal branches without evidence of occlusion. There is no aneurysm or AVM. Venous sinuses: Patent. Anatomic variants: None. Review of the MIP images confirms the above findings CT Brain Perfusion Findings: ASPECTS: 10 on the head CT obtained 5:18 p.m. CBF (<30%) Volume: 70m Perfusion (Tmax>6.0s) volume: 237mMismatch Volume: 2080mnfarction Location:No infarct core is identified. A portion of the ischemic brain is identified in the left occipital lobe which would correspond with the PCA occlusion. IMPRESSION: 1. Multifocal short-segment occlusions of the left P1 and P2 segments with diminutive distal reconstitution. 2. 20 cc ischemic penumbra identified some of which is in the left occipital lobe which would correspond to the PCA occlusion. No core infarct identified. 3. Additional advanced intracranial atherosclerotic disease with multifocal severe stenoses in the anterior and posterior circulations as detailed above. 4. Bulky calcified plaque at the left carotid bifurcation resulting in approximately 70% stenosis of the proximal internal carotid artery. Mild plaque on the right without greater than 50% stenosis. Patent vertebral arteries in the neck. Findings discussed with Dr. KirLeonel Ramsay 5:58 p.m. Electronically Signed   By: PetValetta MoleD.   On: 02/01/2023  18:31   CT HEAD WO CONTRAST (5MM)  Result Date: 01/28/2023 CLINICAL DATA:  Acute stroke suspected, weakness, hyperglycemia EXAM: CT HEAD WITHOUT CONTRAST TECHNIQUE: Contiguous axial images were obtained from the base of the skull through the vertex without intravenous contrast. RADIATION DOSE REDUCTION: This exam was performed according to the departmental dose-optimization program which includes automated exposure control, adjustment of the mA and/or kV according to patient size and/or use of iterative reconstruction technique. COMPARISON:  03/18/2020 FINDINGS: Brain: No evidence of acute infarction, hemorrhage, hydrocephalus, extra-axial collection or mass lesion/mass effect. Unchanged subtle white matter hypodensity of the left corona radiata (series 2, image 18). Unchanged encephalomalacia of the cerebellar hemispheres. Vascular: No hyperdense vessel or unexpected calcification. Skull: Normal. Negative for fracture or focal lesion. Sinuses/Orbits: No acute finding. Other: None. IMPRESSION: 1. No acute intracranial pathology. 2. Unchanged subtle white matter hypodensity of the left corona radiata and encephalomalacia  of the cerebellar hemispheres in keeping with prior infarctions. Electronically Signed   By: Delanna Ahmadi M.D.   On: 01/31/2023 17:37   DG Chest Portable 1 View  Result Date: 02/22/2023 CLINICAL DATA:  Shortness of breath. EXAM: PORTABLE CHEST 1 VIEW COMPARISON:  12/17/2022 FINDINGS: Previous median sternotomy and CABG procedure. Stable cardiac enlargement. Lung volumes are low. Slight asymmetric elevation of the right hemidiaphragm with blunting of the right costophrenic angle. No interstitial edema or airspace disease. Visualized osseous structures appear intact. No displaced rib fractures identified. IMPRESSION: 1. Suspect small right pleural effusion. 2. Low lung volumes. 3. Stable cardiac enlargement. Electronically Signed   By: Kerby Moors M.D.   On: 02/18/2023 13:17     Patient  Profile      UZZIAH HUSER is a 77 y.o. male with a hx of uncontrolled type 2 diabetes, atrial fibrillation on Eliquis, CAD s/p CABG, CVA, hypertension, hyperlipidemia, HFpEF, restless leg syndrome who is being seen 02/25/2023 for the evaluation of elevated troponin at the request of Dr. Vaughan Browner.  Assessment & Plan    1  Elevated troponin  Trop yesterday 2280, 2894, 3251   May reflect demand ischemia in setting of profound anemia    Would continue to provide supportive care as doing   Optimize O2 delivery  I have reviewed echo images Difficult study Overall I think the function is normal with septal hypokinesis consistent with conduction delay  Continue ASA   Note LDL 30   For questions or updates, please contact [place] Please consult [email or web address] for contact info under        Signed, Dorris Carnes, MD  02/25/2023, 3:32 PM

## 2023-02-25 NOTE — Progress Notes (Signed)
eLink Physician-Brief Progress Note Patient Name: [name] DOB: 10-04-1946 MRN: [id number]   Date of Service  02/25/2023  HPI/Events of Note  CT scan reviewed, patient has an intra-abdominal hematoma which precludes anticoagulation for elevated Troponin .  eICU Interventions  Continue current Rx.        Kerry Kass Armond Cuthrell 02/25/2023, 2:55 AM

## 2023-02-25 NOTE — Progress Notes (Incomplete)
Introduction: 20 YOM admitted for DKA, CVA  History of present illness: Presented to ED with weakness, fell 2 or 3 days ago and has had difficulty moving since then. Son believed AMS started 2 days ago, frequently becoems altered when he is in a hyperglycemic state. Initial troponin at arrival to ED was 2280, BNP 685, CBG 787, pH 7.32, lactic acid 2.8. Cardiology recommended deferring heparin infusion, started on DKA protocoland given calcium gluconate due to hyperkalemia. 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 s/p revascularization with balloon angioplasty   Past medical history/allergies/family history pertinent to current illness: T2DM, Afib on Eliquis, CAD s/p CABG, CVA, HTN, HLD, HFpEF  PTA meds: amlodipine 10 mg daily, Eliquis 5 BID, insulin aspart 30 units before breakfast and lunch, 35 units before dinner, losartan 50 daily,plavix 75 daily, furosemide 20 bID, gaba 100 TID, lantus 100 unitd QHS, metformin 1000 BID, metoprolol 12.5 BID, norteiptyline 30 QHS, paroxetine 20 daily, requip 0.5 QHS, ozempic 2 mg weekly, simvastatin 40 daily,tamsulosis 0.4 daily  Social hx:  Physical exam/relevant labs, diagnostic testing/imaging:  Last 24 hours: Discovered to have intra abdominal hemotoma so holding anticoag, had code stroke called due to aphasia found to have acute PCA stroke    Problem based assessment and plan:  - Problem 1: Acute PCA stroke s/p angioplasty  A. Assessment: struggling to speak on initial evaluation, code stroke called, CTA showed multiple occlusion of left p1 and p2, transfer to [facility] for thrombectomy. High grade stenosis of left verterbral junction, rectified with balloon angioplasty to patency of 70%, no stenting done. Aspirin only for now, based on 2019 and 2021 stroke guidelines could be candidate for DAPT due to high grade intracranial stenosis? But due to hematoma will not initate DAPT at this time.  B. Plan:  F/u initiation of DAPT, continue aspirin 81  - Problem 2: DKA  A. Assessment: Has been on insulin drip with regular human insulin which is appropriate there was a 2009 study comparing human insulin vs insulin analogs for treatment of DKA and there was no difference found, currently running at 4.2 units/hr, most recent CBG <250, 164, has been started on dextrose solution with LR with insulin drip as CBG <250. Does not currently meet criteria for d/c of insulin drip as pH <7.3, bicarb is above 15, anion gap is 12. Waiting on possible initiation of tube feeds before stopping insulin drip as well.   B. Plan: Continue insulin drip, monitor ABG for resolution and d/c once tube feeds started, pH >7.3  - Problem 3: elevated troponin/ CHF  A. Assessment: Troponin 2,280 on admit trending up to 3,251, denied ever having any chest pain, no changes on EKG, believed by cardiology to be demand d/t acute stroke, AKI, DKA but can't rule out ACS. Due to anemia requiring transfusion and hemotoma recommend holding off on anticoag. Per cards ok to continue aspirin, start atorvastatin, echo today and will re-evaluate after.  B. Plan: Continue aspirin, f/u echo results and cards  Problem 4: AKI, Scr 2.66 up from baseline in low 1s likely 1.2, on LR with D5 at 100 mL/hr   Supportive: LBM PTA, no bowel regimen, f/u initiation, no dvt ppx d/t hematoma, no feeds, intubated f/u tube feeds, currently holding clopidogrel and Eliquis d/t hemotoma, getting blood for Hgb 7.0, started on fentanyl infsuion for pain control   F- intubated, NG, no feeds A- APAP PRN S- propofol at 65  T- no DVT ppx  H- U- pantoprazole  G-150-210, insulin infusion for DKA  B-LBM PTA, no bowel regimen I-catheter D-no abx

## 2023-02-25 NOTE — Progress Notes (Signed)
eLink Physician-Brief Progress Note Patient Name: [name] DOB: October 20, 1946 MRN: [id number]   Date of Service  02/25/2023  HPI/Events of Note  Cleviprex gtt order about to expire.  eICU Interventions  Order extended by 36 hours.        [name] 02/25/2023, 9:40 PM

## 2023-02-25 NOTE — Progress Notes (Signed)
OT Cancellation Note  Patient Details Name: [name] MRN: [id number] DOB: [birth year]   Cancelled Treatment:    Reason Eval/Treat Not Completed: Patient not medically ready (Pt remained vented and sedated. Unable to participate in OT evaluation at this time. OT will continue to follow and see pt when appropriate.)  Ailene Ravel, OTR/L,CBIS  Supplemental OT - [facility] and [facility] Secure Chat Preferred   02/25/2023, 8:09 AM

## 2023-02-25 NOTE — Anesthesia Postprocedure Evaluation (Signed)
Anesthesia Post Note  Patient: [name]  Procedure(s) Performed: CODE IR     Patient location during evaluation: SICU Anesthesia Type: General Level of consciousness: sedated Pain management: pain level controlled Vital Signs Assessment: post-procedure vital signs reviewed and stable Respiratory status: patient remains intubated per anesthesia plan Cardiovascular status: stable Postop Assessment: no apparent nausea or vomiting Anesthetic complications: no   No notable events documented.  Last Vitals:  Vitals:   02/25/23 0630 02/25/23 0645  BP:    Pulse: 69 69  Resp: 20 18  Temp:    SpO2: 100% 100%    Last Pain:  Vitals:   02/24/23 2233  TempSrc: Axillary                 Santa Lighter

## 2023-02-25 NOTE — Progress Notes (Signed)
Critical lab value as followed: Triponin 2,894.  Called and notified Elink at 0056.

## 2023-02-25 NOTE — Progress Notes (Signed)
eLink Physician-Brief Progress Note Patient Name: [name] DOB: 20-Jan-1946 MRN: [id number]   Date of Service  02/25/2023  HPI/Events of Note  Patient with profound hypoxemia of unclear etiology, unfortunately CTA of the chest is relatively contraindicated by  renal failure (creatinine  > 2.0), AND ANTICOAGULATION RELATIVELY CONTRAINDICATED BY SUBCAPSULAR RENAL HEMATOMA.  eICU Interventions  Will check a portable CXR for now to r/o pneumothorax vs lung collapse, PEEP increased to 14.        [name] 02/25/2023, 4:57 AM

## 2023-02-25 NOTE — Progress Notes (Signed)
eLink Physician-Brief Progress Note Patient Name: [name] DOB: [birth year] MRN: [id number]   Date of Service  02/25/2023  HPI/Events of Note  Troponin continues to trend up, patient has an intra-abdominal hematoma relatively contraindicating anticoagulation.  eICU Interventions  Will consult cardiology.        Kerry Kass Gerhart Ruggieri 02/25/2023, 3:45 AM

## 2023-02-25 NOTE — Progress Notes (Signed)
SLP Cancellation Note  Patient Details Name: [name] MRN: [id number] DOB: 1946/01/27   Cancelled treatment: Order received for speech/language evaluation. Pt remains on ventilator. Will follow for readiness.  Ida Uppal L. Tivis Ringer, MA CCC/SLP Clinical Specialist - Acute Care SLP Acute Rehabilitation Services Office number [phone]           Juan Quam Laurice 02/25/2023, 7:56 AM

## 2023-02-25 NOTE — Progress Notes (Signed)
Transported pt to CT and back with RN. Vitals stable and no complications

## 2023-02-25 NOTE — Progress Notes (Signed)
RT and RN transported to CT and back with stable vitals and no complications.

## 2023-02-25 NOTE — Progress Notes (Signed)
PT Cancellation Note  Patient Details Name: SION GINSBURG MRN: [id number] DOB: 02-Jul-1946   Cancelled Treatment:    Reason Eval/Treat Not Completed: Medical issues which prohibited therapy  Remains sedated on ventilator. Will follow and proceed with evaluation as appropriate.    [place]  Office [phone]   Rexanne Mano 02/25/2023, 8:25 AM

## 2023-02-25 NOTE — Progress Notes (Signed)
Initial Nutrition Assessment  DOCUMENTATION CODES:   Not applicable  INTERVENTION:   Initiate tube feeding via OG tube: Vital 1.2 at 30 ml/h and advance by 10 ml every 8 hours to goal rate of 60 ml/hr (1440 ml per day) Prosource TF20 60 ml daily  Provides 1808 kcal, 128 gm protein, 1167 ml free water daily  TF providing 90% of kcal needs and 100% of protein needs. Cleviprex and Propofol providing additional kcal from lipid   NUTRITION DIAGNOSIS:   Inadequate oral intake related to inability to eat as evidenced by NPO status.  GOAL:   Patient will meet greater than or equal to 90% of their needs  MONITOR:   Vent status, TF tolerance  REASON FOR ASSESSMENT:   Consult Enteral/tube feeding initiation and management  ASSESSMENT:   Pt with PMH of HTN, HLD, CAD (CABG 2017), HF (EF 50-55%), AF on Eliquis, CVA (R pontine stroke, cerebellar stroke), DM, and depression and admitted to [facility] with weakness and AMS. Noted to be in DKA and recurrent CVA ? L PCA and NSTEMI.   Pt discussed during ICU rounds and with RN, PA, and MD.  No family present Pt remains intubated, ok to start TF.  Pt with AKI and possible GIB.   2/28 - s/p balloon angioplasty, no stent   Medications reviewed and include: colace, protonix, miralax Cleviprex @ 28 ml/hr provides: 1344 kcal  Fentanyl  Insulin drip Propofol @ 13 ml/hr provides: 343 kcal   Labs reviewed:  A1C: 11.0 Troponin: 3251 TG: 337 Hgb: 5.1 -> 8.7 CBG's: 151-164    Admission weight 107.8 kg Current weight: 113.7 kg Per chart review weight trends 100-104 kg  OG tube; per xray tip of tube in the proximal stomach  NUTRITION - FOCUSED PHYSICAL EXAM: Pt with mild edema  Flowsheet Row Most Recent Value  Orbital Region No depletion  Upper Arm Region No depletion  Thoracic and Lumbar Region No depletion  Buccal Region No depletion  Temple Region No depletion  Clavicle Bone Region No depletion  Clavicle and Acromion Bone Region  No depletion  Scapular Bone Region No depletion  Dorsal Hand No depletion  Patellar Region No depletion  Anterior Thigh Region No depletion  Posterior Calf Region No depletion  Edema (RD Assessment) None  Hair Reviewed  Eyes Reviewed  Mouth Reviewed  Skin Reviewed  Nails Reviewed       Diet Order:   Diet Order             Diet NPO time specified  Diet effective now                   EDUCATION NEEDS:   No education needs have been identified at this time  Skin:  Skin Assessment: Reviewed RN Assessment  Last BM:  none, abd soft on my exam  Height:   Ht Readings from Last 1 Encounters:  02/13/2023 6' (1.829 m)    Weight:   Wt Readings from Last 1 Encounters:  02/25/23 113.7 kg    BMI:  Body mass index is 34 kg/m.  Estimated Nutritional Needs:   Kcal:  2000-2300  Protein:  120-135 grams  Fluid:  > 2 L/day  Lockie Pares., RD, LDN, CNSC See AMiON for contact information

## 2023-02-25 NOTE — Consult Note (Signed)
Cardiology Consultation   Patient ID: [name] MRN: [id number]; DOB: October 08, 1946  Admit date: 02/21/2023 Date of Consult: 02/25/2023  PCP:  [name], MD   [place] Providers Cardiologist:  None        Patient Profile:   [name] is a 77 y.o. male with a hx of uncontrolled type 2 diabetes, atrial fibrillation on Eliquis, CAD s/p CABG, CVA, hypertension, hyperlipidemia, HFpEF, restless leg syndrome who is being seen 02/25/2023 for the evaluation of elevated troponin at the request of Dr. Vaughan Browner.  History of Present Illness:   [name] is a 77 y.o. male with a hx of uncontrolled type 2 diabetes, atrial fibrillation on Eliquis, CAD s/p CABG, CVA, hypertension, hyperlipidemia, HFpEF, restless leg syndrome who is being seen 02/25/2023 for the evaluation of elevated troponin at the request of Dr. Vaughan Browner. He was initially admitted due to DKA. Initial troponin elevated at 2280. Beta hydroxybutyrate acid elevated at 3.77. Initial lactic acid elevated at 2.7. WBC of 17.6 and hemoglobin of 6.6. Cardioogy was consulted at that time- recommended deferring heparin infusion at this time. Was admitted to the hopsitalist service. Subsequently had stroke code called due to aphasia acute PCA stroke. Severe preocclusive stenosis of the left vertebral basilar junction at the origin of the left posterior inferior cerebellar artery. High-grade stenosis of the left posterior cerebral artery P2 segment, in the P3 segment.   Status post endovascular revascularization of high-grade stenosis of the left vertebral junction with balloon angioplasty to a patency of 70%. He was kept intubated. Another troponin was checked which was 3000. Cardiology was again consulted. CT abdomen pelvis reviewed with subcapsular hematoma with extension into the retroperitoneal space, acute fractures of 11th and 12th ribs.    Past Medical History:  Diagnosis Date   Atrial fibrillation  ([place])    a.) CHA2DS2VASc = 6 (age x 2,  HTN, CVA/TIA x 2, T2DM);  b.) s/p DCCV 09/25/2020 (120 J x1); c.) rate/rhythm maintained on oral metoprolol tartrate; chronically anticoagulated with apixaban   BPH (benign prostatic hyperplasia)    CAD (coronary artery disease) 10/28/2016   a.) LHC 10/28/2016: 95% dLAD, 90% p-mLCx, 75% mLAD, 60% m-dLAD, 99% p-mRCA, 75% dRCA --> consult CVTS; b.) 3v [facility]G [facility] 11/05/2016   Carotid artery disease ([place]) 06/29/2016   a.) carotid US 06/29/2016: <50% BICA   Cerebellar stroke ([place]) 11/13/2015   a.) CT imaging 11/13/2015 revealed old/remote LEFT cerebellar and LEFT carona radiata infarcts   CHF (congestive heart failure) (HCC)    Depression    Diabetic foot ulcers (HCC)    Diastolic dysfunction [id number]   a.) TTE 06/28/2016: EF 55-60%, G1DD; b.) TTE 03/19/2020: EF 55-60%, mild BAE, mild-mod MR/TR.   HLD (hyperlipidemia)    Hypertension    Long term (current) use of anticoagulants    a.) apixaban   Long term current use of antithrombotics/antiplatelets    a.) clopidogrel   Neuropathy    PTSD (post-traumatic stress disorder)    Restless leg syndrome    a.) on ropinirole   Right pontine stroke ([place]) 06/28/2016   a.) MRI 06/28/2016: acute RIGHT paracentral pontine (non-hemorrhagic) CVA   S/P CABG x 3 11/05/2016   a.) LIMA-LAD, SVG-OM1, SVG-PDA   TIA (transient ischemic attack)    a.) multiple since last CVA in 2017 per daughter   Tubular adenoma of colon    Type 2 diabetes mellitus treated with insulin [facility])     Past Surgical History:  Procedure Laterality  Date   AMPUTATION TOE Right 06/14/2022   Procedure: RIGHT GREAT TOE AMPUTATION;  Surgeon: Samara Deist, DPM;  Location: [facility] OR[facility];  Service: Podiatry;  Laterality: Right;   AMPUTATION TOE Right 09/25/2022   Procedure: AMPUTATION TOE - SECOND;  Surgeon: Samara Deist, DPM;  Location: [facility];  Service: Podiatry;  Laterality: Right;   AMPUTATION TOE Right 11/25/2022   Procedure: AMPUTATION  RIGHT 3RD AND 4TH TOES;  Surgeon: Samara Deist, DPM;  Location: [facility] OR[facility];  Service: Podiatry;  Laterality: Right;   CARDIAC CATHETERIZATION Left 10/28/2016   Procedure: Left Heart Cath and Coronary Angiography;  Surgeon: Corey Skains, MD;  Location: [place] [facility];  Service: Cardiovascular;  Laterality: Left;   CARDIOVERSION N/A 09/25/2020   Procedure: CARDIOVERSION;  Surgeon: Corey Skains, MD;  Location: [facility];  Service: Cardiovascular;  Laterality: N/A;   COLONOSCOPY N/A 09/02/2022   Procedure: COLONOSCOPY;  Surgeon: Toledo, Benay Pike, MD;  Location: [facility];  Service: Gastroenterology;  Laterality: N/A;  IDDM   LOWER EXTREMITY ANGIOGRAPHY Right 06/16/2022   Procedure: Lower Extremity Angiography;  Surgeon: Katha Cabal, MD;  Location: [place] [facility];  Service: Cardiovascular;  Laterality: Right;   RADIOLOGY WITH ANESTHESIA N/A 02/23/2023   Procedure: CODE IR;  Surgeon: Radiologist, Medication, MD;  Location: [place];  Service: Radiology;  Laterality: N/A;   SHOULDER ARTHROSCOPY     TRIGGER FINGER RELEASE     UVULOPALATOPHARYNGOPLASTY, TONSILLECTOMY AND SEPTOPLASTY         Inpatient Medications: Scheduled Meds:   stroke: early stages of recovery book   Does not apply Once   aspirin  81 mg Oral Daily   Or   aspirin  81 mg Per Tube Daily   Chlorhexidine Gluconate Cloth  6 each Topical Daily   mupirocin ointment  1 Application Nasal BID   mouth rinse  15 mL Mouth Rinse Q2H   pantoprazole (PROTONIX) IV  40 mg Intravenous Q12H   Continuous Infusions:  clevidipine 21 mg/hr (02/25/23 0329)   insulin 5 Units/hr (02/25/23 0300)   lactated ringers 100 mL/hr at 02/25/23 0300   propofol     propofol (DIPRIVAN) infusion Stopped (02/25/23 0107)   PRN Meds: acetaminophen **OR** acetaminophen (TYLENOL) oral liquid 160 mg/5 mL **OR** acetaminophen, dextrose, mouth rinse, propofol  Allergies:   No Known Allergies  Social History:   Social History    Socioeconomic History   Marital status: Widowed    Spouse name: Not on file   Number of children: 2   Years of education: Not on file   Highest education level: Some college, no degree  Occupational History    Comment: retired  Tobacco Use   Smoking status: Former    Types: Cigars   Smokeless tobacco: Never   Tobacco comments:    Very Hotel manager Use   Vaping Use: Never used  Substance and Sexual Activity   Alcohol use: No    Alcohol/week: 0.0 standard drinks of alcohol   Drug use: No   Sexual activity: Not Currently  Other Topics Concern   Not on file  Social History Narrative   Not on file   Social Determinants of Health   Financial Resource Strain: Low Risk  (03/01/2018)   Overall Financial Resource Strain (CARDIA)    Difficulty of Paying Living Expenses: Not hard at all  Food Insecurity: No Food Insecurity (12/18/2022)   Hunger Vital Sign    Worried About Running Out of Food in the Last Year: Never  true    Ran Out of Food in the Last Year: Never true  Transportation Needs: No Transportation Needs (12/18/2022)   PRAPARE - Hydrologist (Medical): No    Lack of Transportation (Non-Medical): No  Physical Activity: Inactive (03/01/2018)   Exercise Vital Sign    Days of Exercise per Week: 0 days    Minutes of Exercise per Session: 0 min  Stress: No Stress Concern Present (03/01/2018)   [place]    Feeling of Stress : Not at all  Social Connections: Unknown (03/01/2018)   Social Connection and Isolation Panel [NHANES]    Frequency of Communication with Friends and Family: Not on file    Frequency of Social Gatherings with Friends and Family: Not on file    Attends Religious Services: Never    Active Member of Clubs or Organizations: No    Attends Archivist Meetings: Never    Marital Status: Widowed  Intimate Partner Violence: Not At Risk (12/18/2022)    Humiliation, Afraid, Rape, and Kick questionnaire    Fear of Current or Ex-Partner: No    Emotionally Abused: No    Physically Abused: No    Sexually Abused: No    Family History:    Family History  Problem Relation Age of Onset   Hypertension Other    Diabetes Mellitus II Other    Diabetes Mother    Diabetes Sister    Atrial fibrillation Sister    Atrial fibrillation Father    Atrial fibrillation Daughter      ROS:  Please see the history of present illness.  All other ROS reviewed and negative.     Physical Exam/Data:   Vitals:   02/25/23 0315 02/25/23 0319 02/25/23 0330 02/25/23 0400  BP:   (!) 120/53   Pulse: 81 79 74   Resp: (!) '21 19 19   '$ Temp:    (!) 96.7 F (35.9 C)  TempSrc:      SpO2: 94% 94% 96%   Weight:        Intake/Output Summary (Last 24 hours) at 02/25/2023 0534 Last data filed at 02/25/2023 0300 Gross per 24 hour  Intake 3368.36 ml  Output 740 ml  Net 2628.36 ml      02/13/2023   11:00 PM 02/17/2023   11:40 AM 12/17/2022   11:06 AM  Last 3 Weights  Weight (lbs) 243 lb 9.7 oz 237 lb 11.2 oz 207 lb 7.3 oz  Weight (kg) 110.5 kg 107.82 kg 94.1 kg     Body mass index is 33.04 kg/m.  General:  Intubated and sedated HEENT: normal Neck: no JVD Vascular: No carotid bruits; Distal pulses 2+ bilaterally Cardiac:  normal S1, S2; RRR;  Lungs:  mechanical BS Abd: soft, nontender, no hepatomegaly  Ext: no edema Skin: warm and dry   EKG:  The EKG was personally reviewed and demonstrates:  no STEMI  Laboratory Data:  High Sensitivity Troponin:   Recent Labs  Lab 01/28/2023 1143 02/23/2023 2320 02/25/23 0213  TROPONINIHS 2,280* 2,894* 3,251*     Chemistry Recent Labs  Lab 02/22/2023 1421 02/12/2023 1806 02/17/2023 2013 02/05/2023 2022 02/22/2023 2202 02/15/2023 2320 02/25/23 0403 02/25/23 0444  NA 126* 129*   < > 128*   < > 132* 134* 133*  K 5.6* 4.7   < > 4.9   < > 4.5 4.0 4.1  CL 95* 98  --  97*  --  102  --   --  CO2 17* 19*  --   --   --   19*  --   --   GLUCOSE 732* 304*  --  311*  --  273*  --   --   BUN 72* 71*  --  66*  --  61*  --   --   CREATININE 2.96* 3.02*  --  2.70*  --  2.77*  --   --   CALCIUM 7.8* 7.9*  --   --   --  7.4*  --   --   GFRNONAA 21* 20*  --   --   --  23*  --   --   ANIONGAP 14 12  --   --   --  11  --   --    < > = values in this interval not displayed.    Recent Labs  Lab 02/13/2023 1143 02/22/2023 2320  PROT 6.5 5.9*  ALBUMIN 3.3* 2.9*  AST 27 26  ALT 20 18  ALKPHOS 77 70  BILITOT 1.5* 1.3*   Lipids No results for input(s): "CHOL", "TRIG", "HDL", "LABVLDL", "LDLCALC", "CHOLHDL" in the last 168 hours.  Hematology Recent Labs  Lab 02/04/2023 1143 02/03/2023 2013 02/19/2023 2320 02/25/23 0403 02/25/23 0444  WBC 17.6*  --  15.8*  --   --   RBC 2.36*  --  3.10*  --   --   HGB 6.6*   < > 9.1* 7.8* 7.8*  HCT 20.4*   < > 27.0* 23.0* 23.0*  MCV 86.4  --  87.1  --   --   MCH 28.0  --  29.4  --   --   MCHC 32.4  --  33.7  --   --   RDW 18.6*  --  17.1*  --   --   PLT 171  --  164  --   --    < > = values in this interval not displayed.   Thyroid No results for input(s): "TSH", "FREET4" in the last 168 hours.  BNP Recent Labs  Lab 02/22/2023 1143  BNP 685.7*    DDimer No results for input(s): "DDIMER" in the last 168 hours.   Radiology/Studies:  DG Chest Port 1 View  Result Date: 02/25/2023 CLINICAL DATA:  Acute respiratory failure EXAM: PORTABLE CHEST 1 VIEW COMPARISON:  02/07/2023 FINDINGS: Endotracheal tube with tip at the clavicular heads. An enteric tube reaches the stomach. Artifact from EKG leads. Elevated right diaphragm with hazy infiltrate at the right base. No edema, effusion, or pneumothorax. Cardiomegaly.  Prior CABG. IMPRESSION: Stable hardware positioning and volume loss at the right lung base. Electronically Signed   By: Jorje Guild M.D.   On: 02/25/2023 05:27   CT HEAD WO CONTRAST (5MM)  Result Date: 02/25/2023 CLINICAL DATA:  Initial evaluation for acute mental status  change. EXAM: CT HEAD WITHOUT CONTRAST TECHNIQUE: Contiguous axial images were obtained from the base of the skull through the vertex without intravenous contrast. RADIATION DOSE REDUCTION: This exam was performed according to the departmental dose-optimization program which includes automated exposure control, adjustment of the mA and/or kV according to patient size and/or use of iterative reconstruction technique. COMPARISON:  Prior studies from 02/23/2023. FINDINGS: Brain: Mild age-related cerebral atrophy. Remote ischemic change at the left basal ganglia/corona radiata. 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 No acute intracranial hemorrhage. No visible acute large vessel territory infarct. No mass lesion or midline shift. No hydrocephalus or extra-axial fluid collection. Vascular: Contrast material remains  on board from recent procedures. Scattered calcified atherosclerosis present about the skull base. Skull: Scalp soft tissues and calvarium demonstrate no new finding. Sinuses/Orbits: Globes orbital soft tissues within normal limits. Scattered mucosal thickening present throughout the paranasal sinuses. Patient is intubated. Mastoid air cells remain clear. Other: None. IMPRESSION: 1. No acute intracranial abnormality. 2. Chronic ischemic changes involving the left basal ganglia/corona radiata and left greater than right cerebellum, stable. Electronically Signed   By: Jeannine Boga M.D.   On: 02/25/2023 02:30   CT ABDOMEN PELVIS WO CONTRAST  Addendum Date: 02/25/2023   ADDENDUM REPORT: 02/25/2023 01:09 ADDENDUM: These results were called by telephone at the time of interpretation on 02/25/2023 at 1:09 am to provider [facility] , who verbally acknowledged these results. Electronically Signed   By: Fidela Salisbury M.D.   On: 02/25/2023 01:09   Result Date: 02/25/2023 CLINICAL DATA:  Retroperitoneal hemorrhage EXAM: CT ABDOMEN AND PELVIS WITHOUT CONTRAST TECHNIQUE: Multidetector CT  imaging of the abdomen and pelvis was performed following the standard protocol without IV contrast. RADIATION DOSE REDUCTION: This exam was performed according to the departmental dose-optimization program which includes automated exposure control, adjustment of the mA and/or kV according to patient size and/or use of iterative reconstruction technique. COMPARISON:  None Available. FINDINGS: Lower chest: Bibasilar atelectasis. Extensive coronary artery calcification. Nasogastric tube tip extends into the proximal body of the stomach. Hepatobiliary: No focal liver abnormality is seen. No gallstones, gallbladder wall thickening, or biliary dilatation. Pancreas: Unremarkable Spleen: Unremarkable Adrenals/Urinary Tract: The adrenal glands are unremarkable. The kidneys are normal in position. Contrast enhancement of the renal cortices and contrast within the renal collecting system is related to recent CT arteriography and endovascular procedure. A moderate right subcapsular hematoma is seen involving the mid lower pole the right kidney with moderate extension of hemorrhage into the posterior pararenal space. There are, additionally, punctate foci of gas along the posteroinferior aspect of the subcapsular hematoma suggesting possible intervention, as can be seen with recent renal biopsy, or, less likely, superimposed infection. The left kidney is unremarkable. The bladder is decompressed and is unremarkable. Stomach/Bowel: Stomach is within normal limits. Appendix appears normal. No evidence of bowel wall thickening, distention, or inflammatory changes. Vascular/Lymphatic: Aortic atherosclerosis. No enlarged abdominal or pelvic lymph nodes. Reproductive: Prostate is unremarkable. Other: Mild asymmetric subcutaneous edema within the right flank may relate to trauma or dependent positioning. No abdominal wall hernia. Musculoskeletal: Acute fractures of the right eleventh and twelfth ribs are identified posteriorly.  Moderate L1 compression deformity with 40-50% loss of height is seen likely remote in nature. No associated retropulsion. Degenerative changes are seen within the visualized thoracolumbar spine. IMPRESSION: 1. Moderate right renal subcapsular hematoma with moderate extension of hemorrhage into the posterior pararenal space. Punctate foci of gas along the posteroinferior aspect of the subcapsular hematoma suggesting possible intervention, as can be seen with recent renal biopsy, or, less likely, superimposed infection. Correlation for history of recent biopsy is recommended. 2. Acute fractures of the right eleventh and twelfth ribs posteriorly. 3. Extensive coronary artery calcification. 4. Moderate L1 compression deformity, likely remote in nature. 5. Aortic atherosclerosis. Aortic Atherosclerosis (ICD10-I70.0). Electronically Signed: By: Fidela Salisbury M.D. On: 02/25/2023 01:00   DG CHEST PORT 1 VIEW  Result Date: 02/25/2023 CLINICAL DATA:  Feeding tube placement.  Acute respiratory failure. EXAM: PORTABLE CHEST 1 VIEW, ABDOMEN PORTABLE ONE VIEW COMPARISON:  02/06/2023. FINDINGS: Heart is enlarged in the mediastinal contour is stable. The pulmonary vasculature is distended. Lung volumes are low  with minimal atelectasis at the lung bases bilaterally. There is a small right pleural effusion. No pneumothorax. Sternotomy wires are noted over the midline. The endotracheal tube terminates 4.9 cm above the carina. There is no evidence of bowel obstruction on limited view of the upper abdomen. The enteric tube terminates in the proximal stomach. IMPRESSION: 1. Cardiomegaly with mildly distended pulmonary vasculature. 2. Small right pleural effusion with atelectasis at the right lung base. 3. Support apparatus as detailed above. Electronically Signed   By: Brett Fairy M.D.   On: 02/25/2023 00:29   DG Abd Portable 1V  Result Date: 02/25/2023 CLINICAL DATA:  Feeding tube placement.  Acute respiratory failure. EXAM:  PORTABLE CHEST 1 VIEW, ABDOMEN PORTABLE ONE VIEW COMPARISON:  02/19/2023. FINDINGS: Heart is enlarged in the mediastinal contour is stable. The pulmonary vasculature is distended. Lung volumes are low with minimal atelectasis at the lung bases bilaterally. There is a small right pleural effusion. No pneumothorax. Sternotomy wires are noted over the midline. The endotracheal tube terminates 4.9 cm above the carina. There is no evidence of bowel obstruction on limited view of the upper abdomen. The enteric tube terminates in the proximal stomach. IMPRESSION: 1. Cardiomegaly with mildly distended pulmonary vasculature. 2. Small right pleural effusion with atelectasis at the right lung base. 3. Support apparatus as detailed above. Electronically Signed   By: Brett Fairy M.D.   On: 02/25/2023 00:29   CT ANGIO HEAD NECK W WO CM W PERF (CODE STROKE)  Result Date: 02/08/2023 CLINICAL DATA:  Concern for left MCA or PCA stroke. Decline in speech and right-sided weakness. EXAM: CT ANGIOGRAPHY HEAD AND NECK CT PERFUSION BRAIN TECHNIQUE: Multidetector CT imaging of the head and neck was performed using the standard protocol during bolus administration of intravenous contrast. Multiplanar CT image reconstructions and MIPs were obtained to evaluate the vascular anatomy. Carotid stenosis measurements (when applicable) are obtained utilizing NASCET criteria, using the distal internal carotid diameter as the denominator. Multiphase CT imaging of the brain was performed following IV bolus contrast injection. Subsequent parametric perfusion maps were calculated using RAPID software. RADIATION DOSE REDUCTION: This exam was performed according to the departmental dose-optimization program which includes automated exposure control, adjustment of the mA and/or kV according to patient size and/or use of iterative reconstruction technique. CONTRAST:  126m OMNIPAQUE IOHEXOL 350 MG/ML SOLN COMPARISON:  Same-day CT head.  Carotid Doppler  03/19/2020. FINDINGS: CTA NECK FINDINGS Aortic arch: There is mild calcified plaque in the imaged aortic arch. The origins of the major branch vessels are patent. The subclavian arteries are patent to the level imaged. Right carotid system: The right common, internal, and external carotid arteries are patent with calcified plaque at the bifurcation resulting in less than 50% stenosis of the proximal internal carotid artery. The distal internal carotid artery is patent the external carotid artery is patent. There is no dissection or aneurysm. Left carotid system: The left common carotid artery is patent. There is bulky plaque at the bifurcation resulting in up to approximately 70% stenosis. The distal internal carotid artery is patent. The external carotid artery is patent. There is no evidence of dissection or aneurysm. Vertebral arteries: The vertebral arteries are patent, without hemodynamically significant stenosis or occlusion. There is no evidence of dissection or aneurysm Skeleton: There is no acute osseous abnormality or suspicious osseous lesion. There is no visible canal hematoma. Other neck: The soft tissues of the neck are unremarkable. Upper chest: The imaged lung apices are clear. Review of the MIP  images confirms the above findings CTA HEAD FINDINGS Anterior circulation: There is extensive calcified plaque in the carotid siphons resulting severe stenosis bilaterally. The left M1 segment is patent. There is short-segment severe stenosis of left M2 branches in the sylvian fissure proximally (8-117). There is no evidence of occlusion. There is moderate stenosis of the distal right M1 segment. There is focal moderate stenosis of a right M2 branch in the sylvian fissure (8-123). The distal branches are otherwise patent, without other proximal high-grade stenosis or occlusion. There is severe stenosis of the origin of the right A1 segment (6-162). Otherwise, the ACAs are patent, without other proximal  high-grade stenosis or occlusion. The anterior communicating artery is normal. There is no aneurysm or AVM. Posterior circulation: There is calcified plaque in the bilateral V4 segments resulting severe stenosis bilaterally. The PICA origins are not identified. The basilar artery is patent with moderate stenosis distally [id number]). There is multifocal short-segment occlusion of the left P1 and P2 segments (8-134, 141). There is intermittent diminutive reconstitution of flow in the distal branches. There is a fetal origin of the right PCA. There is severe stenosis of the proximal P2 segment (8-137). There is multifocal irregularity and narrowing of the distal branches without evidence of occlusion. There is no aneurysm or AVM. Venous sinuses: Patent. Anatomic variants: None. Review of the MIP images confirms the above findings CT Brain Perfusion Findings: ASPECTS: 10 on the head CT obtained 5:18 p.m. CBF (<30%) Volume: 52m Perfusion (Tmax>6.0s) volume: 280mMismatch Volume: 2087mnfarction Location:No infarct core is identified. A portion of the ischemic brain is identified in the left occipital lobe which would correspond with the PCA occlusion. IMPRESSION: 1. Multifocal short-segment occlusions of the left P1 and P2 segments with diminutive distal reconstitution. 2. 20 cc ischemic penumbra identified some of which is in the left occipital lobe which would correspond to the PCA occlusion. No core infarct identified. 3. Additional advanced intracranial atherosclerotic disease with multifocal severe stenoses in the anterior and posterior circulations as detailed above. 4. Bulky calcified plaque at the left carotid bifurcation resulting in approximately 70% stenosis of the proximal internal carotid artery. Mild plaque on the right without greater than 50% stenosis. Patent vertebral arteries in the neck. Findings discussed with Dr. KirLeonel Ramsay 5:58 p.m. Electronically Signed   By: PetValetta MoleD.   On: 02/03/2023  18:31   CT HEAD WO CONTRAST (5MM)  Result Date: 02/17/2023 CLINICAL DATA:  Acute stroke suspected, weakness, hyperglycemia EXAM: CT HEAD WITHOUT CONTRAST TECHNIQUE: Contiguous axial images were obtained from the base of the skull through the vertex without intravenous contrast. RADIATION DOSE REDUCTION: This exam was performed according to the departmental dose-optimization program which includes automated exposure control, adjustment of the mA and/or kV according to patient size and/or use of iterative reconstruction technique. COMPARISON:  03/18/2020 FINDINGS: Brain: No evidence of acute infarction, hemorrhage, hydrocephalus, extra-axial collection or mass lesion/mass effect. Unchanged subtle white matter hypodensity of the left corona radiata (series 2, image 18). Unchanged encephalomalacia of the cerebellar hemispheres. Vascular: No hyperdense vessel or unexpected calcification. Skull: Normal. Negative for fracture or focal lesion. Sinuses/Orbits: No acute finding. Other: None. IMPRESSION: 1. No acute intracranial pathology. 2. 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 Electronically Signed   By: AleDelanna AhmadiD.   On: 01/28/2023 17:37   DG Chest Portable 1 View  Result Date: 02/16/2023 CLINICAL DATA:  Shortness of breath.  EXAM: PORTABLE CHEST 1 VIEW COMPARISON:  12/17/2022 FINDINGS: Previous median sternotomy and CABG procedure. Stable cardiac enlargement. Lung volumes are low. Slight asymmetric elevation of the right hemidiaphragm with blunting of the right costophrenic angle. No interstitial edema or airspace disease. Visualized osseous structures appear intact. No displaced rib fractures identified. IMPRESSION: 1. Suspect small right pleural effusion. 2. Low lung volumes. 3. Stable cardiac enlargement. Electronically Signed   By: Kerby Moors M.D.   On: 02/13/2023 13:17      Assessment and Plan:   # Elevated Troponin # CAD s/p CABG # HFpEF # Acute Stroke # Acute Anemia # DKA  -On presentation, patient's troponin elevated at 2200. Per report, he never had any chest pain. Now intubated -EKG no acute changes -Troponin most likely due to demand with acute stroke, AKI, DKA etc. However cannot rule out ACS -However with acute anemia requiring blood transfusion + CT abdomen pelvis reviewed with subcapsular hematoma with extension into the retroperitoneal space, would hold off the Anticoagulation -Okay to continue aspirin -Start Atorvastatin -Echo in AM -Will look at [facility] at Echo.  -Okay to hold diuretics as came with DKA- please get CXR -Telemetry -Trend HB  Will continue to follow closely, and will re-evaluate after Echo.    For questions or updates, please contact [place] Please consult [email or web address] for contact info under    Signed, Jaci Lazier, MD  02/25/2023 5:34 AM

## 2023-02-25 NOTE — Inpatient Diabetes Management (Addendum)
Inpatient Diabetes Program Recommendations  AACE/ADA: New Consensus Statement on Inpatient Glycemic Control   Target Ranges:  Prepandial:   less than 140 mg/dL      Peak postprandial:   less than 180 mg/dL (1-2 hours)      Critically ill patients:  140 - 180 mg/dL    Latest Reference Range & Units 02/25/23 02:14 02/25/23 03:16 02/25/23 04:14 02/25/23 05:34 02/25/23 06:44 02/25/23 07:40  Glucose-Capillary 70 - 99 mg/dL 205 (H) 210 (H) 208 (H) 207 (H) 168 (H) 154 (H)    Latest Reference Range & Units 02/24/23 11:43  CO2 22 - 32 mmol/L 16 (L)  Glucose 70 - 99 mg/dL 787 (HH)  BUN 8 - 23 mg/dL 70 (H)  Creatinine 0.61 - 1.24 mg/dL 2.97 (H)  Calcium 8.9 - 10.3 mg/dL 7.6 (L)  Anion gap 5 - 15  14    Latest Reference Range & Units 02/24/23 11:43 02/24/23 18:06  Beta-Hydroxybutyric Acid 0.05 - 0.27 mmol/L 3.77 (H) 0.74 (H)    Review of Glycemic Control  Diabetes history: DM2 Outpatient Diabetes medications: Lantus 100 units QHS, Novolog 30 units with breakfast, 30 units with lunch, 35 units with supper, Metformin 1000 mg BID, Ozempic 2 mg Qweek; FreeStyle Libre2 Current orders for Inpatient glycemic control: IV insulin  Inpatient Diabetes Program Recommendations:    Insulin: Once provider is ready to transition from IV to SQ insulin, please consider using ICU Glycemic Control order set Phase 3 (using most current insulin drip rate at time of transition to get dose of basal insulin).  NOTE: Patient admitted with acute CVA, DKA, NSTEMI, acute on chronic CHF, AKI, acute anemia, leukocytosis, and a-flutter. Initial glucose 787 mg/dl on 02/24/23. Patient is currently ordered IV insulin per DKA order set and on the ventilator. Would recommend using the ICU Glycemic Control order set when provider is ready to transition from IV to SQ insulin.   Noted patient sees [facility] Endocrinology for DM management and last seen Eugenia Pancoast, PA on 02/11/23; per office visit note patient  was instructed to continue Lantus 100 units QHS, Novolog 30 units with breakfast and lunch, Novolog 35 units wit supper, Metformin 1000 mg BID, take Ozempic 2 mg Qweek until home supply is used up and then start Mounjaro 7.5 mg Qweek.  Thanks, Barnie Alderman, RN, MSN, [place] Diabetes Coordinator Inpatient Diabetes Program [phone] (Team Pager from 8am to [place])

## 2023-02-25 NOTE — Progress Notes (Addendum)
STROKE TEAM PROGRESS NOTE   INTERVAL HISTORY No family at the bedside. RN at the bedside.  He is intubated on 24mg of Prop, '14mg'$  of Cleviprex, on insulin gtt and getting PRBC for HGb  7.0 this am.  BP goal 120-140. He is purposeful on left hemibody, right side does move to painful stimuli. RN reports when sedation was lower he was able to follow some commands.  Patient was found to have multifocal short segment occlusions of the left P1 and P2 with 20 cc of penumbra in the left occipital lobe and was taken for emergent thrombectomy but found to have severe preocclusive stenosis of the left vertebral basilar junction affecting the origin of the left PICA for which she underwent emergent angioplasty.  High-grade stenosis of the left posterior P2 segment could not be accessed.  Follow-up CT scan from this morning shows no hemorrhage or large stroke.  Patient has subsequently been found to have a large retroperitoneal hematoma and is severely anemic getting blood transfusion.  He has been Intubated and sedated with propofol and is on Cleviprex for blood pressure control.  Neurological exam is limited by sedation Vitals:   02/25/23 0715 02/25/23 0730 02/25/23 0739 02/25/23 0740  BP:      Pulse: 69 71 70   Resp: 20 18 (!) 25   Temp:      TempSrc:      SpO2: 100% 100% 100% 100%  Weight:       CBC:  Recent Labs  Lab 02/20/2023 1143 01/30/2023 2013 02/16/2023 2320 02/25/23 0403 02/25/23 0444 02/25/23 0537  WBC 17.6*  --  15.8*  --   --  17.8*  NEUTROABS 15.1*  --   --   --   --  15.4*  HGB 6.6*   < > 9.1*   < > 7.8* 7.0*  HCT 20.4*   < > 27.0*   < > 23.0* 19.8*  MCV 86.4  --  87.1  --   --  87.2  PLT 171  --  164  --   --  171   < > = values in this interval not displayed.   Basic Metabolic Panel:  Recent Labs  Lab 01/28/2023 2320 02/25/23 0403 02/25/23 0444 02/25/23 0537  NA 132*   < > 133* 134*  K 4.5   < > 4.1 4.4  CL 102  --   --  103  CO2 19*  --   --  19*  GLUCOSE 273*  --   --   188*  BUN 61*  --   --  58*  CREATININE 2.77*  --   --  2.66*  CALCIUM 7.4*  --   --  7.5*   < > = values in this interval not displayed.   Lipid Panel:  Recent Labs  Lab 02/25/23 0537  CHOL 126  TRIG 337*  HDL 29*  CHOLHDL 4.3  VLDL 67*  LDLCALC 30   HgbA1c: No results for input(s): "HGBA1C" in the last 168 hours. Urine Drug Screen:  Recent Labs  Lab 02/06/2023 1806  LABOPIA NONE DETECTED  COCAINSCRNUR NONE DETECTED  LABBENZ NONE DETECTED  AMPHETMU NONE DETECTED  THCU NONE DETECTED  LABBARB NONE DETECTED    Alcohol Level  Recent Labs  Lab 02/20/2023 1806  ETH <10    IMAGING past 24 hours DG Chest Port 1 View  Result Date: 02/25/2023 CLINICAL DATA:  Acute respiratory failure EXAM: PORTABLE CHEST 1 VIEW COMPARISON:  02/11/2023 FINDINGS: Endotracheal tube  with tip at the clavicular heads. An enteric tube reaches the stomach. Artifact from EKG leads. Elevated right diaphragm with hazy infiltrate at the right base. No edema, effusion, or pneumothorax. Cardiomegaly.  Prior CABG. IMPRESSION: Stable hardware positioning and volume loss at the right lung base. Electronically Signed   By: Jorje Guild M.D.   On: 02/25/2023 05:27   CT HEAD WO CONTRAST (5MM)  Result Date: 02/25/2023 CLINICAL DATA:  Initial evaluation for acute mental status change. EXAM: CT HEAD WITHOUT CONTRAST TECHNIQUE: Contiguous axial images were obtained from the base of the skull through the vertex without intravenous contrast. RADIATION DOSE REDUCTION: This exam was performed according to the departmental dose-optimization program which includes automated exposure control, adjustment of the mA and/or kV according to patient size and/or use of iterative reconstruction technique. COMPARISON:  Prior studies from 02/15/2023. FINDINGS: Brain: Mild age-related cerebral atrophy. Remote ischemic change at the left basal ganglia/corona radiata. 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 No acute intracranial  hemorrhage. No visible acute large vessel territory infarct. No mass lesion or midline shift. No hydrocephalus or extra-axial fluid collection. Vascular: Contrast material remains on board from recent procedures. Scattered calcified atherosclerosis present about the skull base. Skull: Scalp soft tissues and calvarium demonstrate no new finding. Sinuses/Orbits: Globes orbital soft tissues within normal limits. Scattered mucosal thickening present throughout the paranasal sinuses. Patient is intubated. Mastoid air cells remain clear. Other: None. IMPRESSION: 1. No acute intracranial abnormality. 2. Chronic ischemic changes involving the left basal ganglia/corona radiata and left greater than right cerebellum, stable. Electronically Signed   By: Jeannine Boga M.D.   On: 02/25/2023 02:30   CT ABDOMEN PELVIS WO CONTRAST  Addendum Date: 02/25/2023   ADDENDUM REPORT: 02/25/2023 01:09 ADDENDUM: These results were called by telephone at the time of interpretation on 02/25/2023 at 1:09 am to provider [facility] , who verbally acknowledged these results. Electronically Signed   By: Fidela Salisbury M.D.   On: 02/25/2023 01:09   Result Date: 02/25/2023 CLINICAL DATA:  Retroperitoneal hemorrhage EXAM: CT ABDOMEN AND PELVIS WITHOUT CONTRAST TECHNIQUE: Multidetector CT imaging of the abdomen and pelvis was performed following the standard protocol without IV contrast. RADIATION DOSE REDUCTION: This exam was performed according to the departmental dose-optimization program which includes automated exposure control, adjustment of the mA and/or kV according to patient size and/or use of iterative reconstruction technique. COMPARISON:  None Available. FINDINGS: Lower chest: Bibasilar atelectasis. Extensive coronary artery calcification. Nasogastric tube tip extends into the proximal body of the stomach. Hepatobiliary: No focal liver abnormality is seen. No gallstones, gallbladder wall thickening, or biliary dilatation.  Pancreas: Unremarkable Spleen: Unremarkable Adrenals/Urinary Tract: The adrenal glands are unremarkable. The kidneys are normal in position. Contrast enhancement of the renal cortices and contrast within the renal collecting system is related to recent CT arteriography and endovascular procedure. A moderate right subcapsular hematoma is seen involving the mid lower pole the right kidney with moderate extension of hemorrhage into the posterior pararenal space. There are, additionally, punctate foci of gas along the posteroinferior aspect of the subcapsular hematoma suggesting possible intervention, as can be seen with recent renal biopsy, or, less likely, superimposed infection. The left kidney is unremarkable. The bladder is decompressed and is unremarkable. Stomach/Bowel: Stomach is within normal limits. Appendix appears normal. No evidence of bowel wall thickening, distention, or inflammatory changes. Vascular/Lymphatic: Aortic atherosclerosis. No enlarged abdominal or pelvic lymph nodes. Reproductive: Prostate is unremarkable. Other: Mild asymmetric subcutaneous edema within the right flank may relate to  trauma or dependent positioning. No abdominal wall hernia. Musculoskeletal: Acute fractures of the right eleventh and twelfth ribs are identified posteriorly. Moderate L1 compression deformity with 40-50% loss of height is seen likely remote in nature. No associated retropulsion. Degenerative changes are seen within the visualized thoracolumbar spine. IMPRESSION: 1. Moderate right renal subcapsular hematoma with moderate extension of hemorrhage into the posterior pararenal space. Punctate foci of gas along the posteroinferior aspect of the subcapsular hematoma suggesting possible intervention, as can be seen with recent renal biopsy, or, less likely, superimposed infection. Correlation for history of recent biopsy is recommended. 2. Acute fractures of the right eleventh and twelfth ribs posteriorly. 3. Extensive  coronary artery calcification. 4. Moderate L1 compression deformity, likely remote in nature. 5. Aortic atherosclerosis. Aortic Atherosclerosis (ICD10-I70.0). Electronically Signed: By: Fidela Salisbury M.D. On: 02/25/2023 01:00   DG CHEST PORT 1 VIEW  Result Date: 02/25/2023 CLINICAL DATA:  Feeding tube placement.  Acute respiratory failure. EXAM: PORTABLE CHEST 1 VIEW, ABDOMEN PORTABLE ONE VIEW COMPARISON:  02/03/2023. FINDINGS: Heart is enlarged in the mediastinal contour is stable. The pulmonary vasculature is distended. Lung volumes are low with minimal atelectasis at the lung bases bilaterally. There is a small right pleural effusion. No pneumothorax. Sternotomy wires are noted over the midline. The endotracheal tube terminates 4.9 cm above the carina. There is no evidence of bowel obstruction on limited view of the upper abdomen. The enteric tube terminates in the proximal stomach. IMPRESSION: 1. Cardiomegaly with mildly distended pulmonary vasculature. 2. Small right pleural effusion with atelectasis at the right lung base. 3. Support apparatus as detailed above. Electronically Signed   By: Brett Fairy M.D.   On: 02/25/2023 00:29   DG Abd Portable 1V  Result Date: 02/25/2023 CLINICAL DATA:  Feeding tube placement.  Acute respiratory failure. EXAM: PORTABLE CHEST 1 VIEW, ABDOMEN PORTABLE ONE VIEW COMPARISON:  02/23/2023. FINDINGS: Heart is enlarged in the mediastinal contour is stable. The pulmonary vasculature is distended. Lung volumes are low with minimal atelectasis at the lung bases bilaterally. There is a small right pleural effusion. No pneumothorax. Sternotomy wires are noted over the midline. The endotracheal tube terminates 4.9 cm above the carina. There is no evidence of bowel obstruction on limited view of the upper abdomen. The enteric tube terminates in the proximal stomach. IMPRESSION: 1. Cardiomegaly with mildly distended pulmonary vasculature. 2. Small right pleural effusion with  atelectasis at the right lung base. 3. Support apparatus as detailed above. Electronically Signed   By: Brett Fairy M.D.   On: 02/25/2023 00:29   CT ANGIO HEAD NECK W WO CM W PERF (CODE STROKE)  Result Date: 02/16/2023 CLINICAL DATA:  Concern for left MCA or PCA stroke. Decline in speech and right-sided weakness. EXAM: CT ANGIOGRAPHY HEAD AND NECK CT PERFUSION BRAIN TECHNIQUE: Multidetector CT imaging of the head and neck was performed using the standard protocol during bolus administration of intravenous contrast. Multiplanar CT image reconstructions and MIPs were obtained to evaluate the vascular anatomy. Carotid stenosis measurements (when applicable) are obtained utilizing NASCET criteria, using the distal internal carotid diameter as the denominator. Multiphase CT imaging of the brain was performed following IV bolus contrast injection. Subsequent parametric perfusion maps were calculated using RAPID software. RADIATION DOSE REDUCTION: This exam was performed according to the departmental dose-optimization program which includes automated exposure control, adjustment of the mA and/or kV according to patient size and/or use of iterative reconstruction technique. CONTRAST:  139m OMNIPAQUE IOHEXOL 350 MG/ML SOLN COMPARISON:  Same-day CT  head.  Carotid Doppler 03/19/2020. FINDINGS: CTA NECK FINDINGS Aortic arch: There is mild calcified plaque in the imaged aortic arch. The origins of the major branch vessels are patent. The subclavian arteries are patent to the level imaged. Right carotid system: The right common, internal, and external carotid arteries are patent with calcified plaque at the bifurcation resulting in less than 50% stenosis of the proximal internal carotid artery. The distal internal carotid artery is patent the external carotid artery is patent. There is no dissection or aneurysm. Left carotid system: The left common carotid artery is patent. There is bulky plaque at the bifurcation  resulting in up to approximately 70% stenosis. The distal internal carotid artery is patent. The external carotid artery is patent. There is no evidence of dissection or aneurysm. Vertebral arteries: The vertebral arteries are patent, without hemodynamically significant stenosis or occlusion. There is no evidence of dissection or aneurysm Skeleton: There is no acute osseous abnormality or suspicious osseous lesion. There is no visible canal hematoma. Other neck: The soft tissues of the neck are unremarkable. Upper chest: The imaged lung apices are clear. Review of the MIP images confirms the above findings CTA HEAD FINDINGS Anterior circulation: There is extensive calcified plaque in the carotid siphons resulting severe stenosis bilaterally. The left M1 segment is patent. There is short-segment severe stenosis of left M2 branches in the sylvian fissure proximally (8-117). There is no evidence of occlusion. There is moderate stenosis of the distal right M1 segment. There is focal moderate stenosis of a right M2 branch in the sylvian fissure (8-123). The distal branches are otherwise patent, without other proximal high-grade stenosis or occlusion. There is severe stenosis of the origin of the right A1 segment (6-162). Otherwise, the ACAs are patent, without other proximal high-grade stenosis or occlusion. The anterior communicating artery is normal. There is no aneurysm or AVM. Posterior circulation: There is calcified plaque in the bilateral V4 segments resulting severe stenosis bilaterally. The PICA origins are not identified. The basilar artery is patent with moderate stenosis distally [id number]). There is multifocal short-segment occlusion of the left P1 and P2 segments (8-134, 141). There is intermittent diminutive reconstitution of flow in the distal branches. There is a fetal origin of the right PCA. There is severe stenosis of the proximal P2 segment (8-137). There is multifocal irregularity and narrowing of the  distal branches without evidence of occlusion. There is no aneurysm or AVM. Venous sinuses: Patent. Anatomic variants: None. Review of the MIP images confirms the above findings CT Brain Perfusion Findings: ASPECTS: 10 on the head CT obtained 5:18 p.m. CBF (<30%) Volume: 86m Perfusion (Tmax>6.0s) volume: 241mMismatch Volume: 2087mnfarction Location:No infarct core is identified. A portion of the ischemic brain is identified in the left occipital lobe which would correspond with the PCA occlusion. IMPRESSION: 1. Multifocal short-segment occlusions of the left P1 and P2 segments with diminutive distal reconstitution. 2. 20 cc ischemic penumbra identified some of which is in the left occipital lobe which would correspond to the PCA occlusion. No core infarct identified. 3. Additional advanced intracranial atherosclerotic disease with multifocal severe stenoses in the anterior and posterior circulations as detailed above. 4. Bulky calcified plaque at the left carotid bifurcation resulting in approximately 70% stenosis of the proximal internal carotid artery. Mild plaque on the right without greater than 50% stenosis. Patent vertebral arteries in the neck. Findings discussed with Dr. KirLeonel Ramsay 5:58 p.m. Electronically Signed   By: PetValetta MoleD.   On: 02/18/2023 18:31  CT HEAD WO CONTRAST (5MM)  Result Date: 02/23/2023 CLINICAL DATA:  Acute stroke suspected, weakness, hyperglycemia EXAM: CT HEAD WITHOUT CONTRAST TECHNIQUE: Contiguous axial images were obtained from the base of the skull through the vertex without intravenous contrast. RADIATION DOSE REDUCTION: This exam was performed according to the departmental dose-optimization program which includes automated exposure control, adjustment of the mA and/or kV according to patient size and/or use of iterative reconstruction technique. COMPARISON:  03/18/2020 FINDINGS: Brain: No evidence of acute infarction, hemorrhage, hydrocephalus, extra-axial  collection or mass lesion/mass effect. Unchanged subtle white matter hypodensity of the left corona radiata (series 2, image 18). Unchanged encephalomalacia of the cerebellar hemispheres. Vascular: No hyperdense vessel or unexpected calcification. Skull: Normal. Negative for fracture or focal lesion. Sinuses/Orbits: No acute finding. Other: None. IMPRESSION: 1. No acute intracranial pathology. 2. 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 Electronically Signed   By: Delanna Ahmadi M.D.   On: 02/03/2023 17:37   DG Chest Portable 1 View  Result Date: 02/12/2023 CLINICAL DATA:  Shortness of breath. EXAM: PORTABLE CHEST 1 VIEW COMPARISON:  12/17/2022 FINDINGS: Previous median sternotomy and CABG procedure. Stable cardiac enlargement. Lung volumes are low. Slight asymmetric elevation of the right hemidiaphragm with blunting of the right costophrenic angle. No interstitial edema or airspace disease. Visualized osseous structures appear intact. No displaced rib fractures identified. IMPRESSION: 1. Suspect small right pleural effusion. 2. Low lung volumes. 3. Stable cardiac enlargement. Electronically Signed   By: Kerby Moors M.D.   On: 02/16/2023 13:17    PHYSICAL EXAM  Temp:  [96.7 F (35.9 C)-98.7 F (37.1 C)] 98.4 F (36.9 C) (02/29 1115) Pulse Rate:  [69-93] 74 (02/29 1215) Resp:  [13-28] 18 (02/29 1215) BP: (100-134)/(50-61) 106/54 (02/29 1200) SpO2:  [87 %-100 %] 97 % (02/29 1215) Arterial Line BP: (113-149)/(35-55) 144/52 (02/29 1215) FiO2 (%):  [70 %-100 %] 70 % (02/29 1108) Weight:  [110.5 kg-113.7 kg] 113.7 kg (02/29 0530)  General - critically ill male, intubated in NAD  Cardiovascular - irregularly irregular   Mental Status -  Patient is intubated and sedated on Prop. He is not following commands, eyes are closed.  PERRL, eyes midline, EOMI, bilateral corneal reflexes.    Cranial Nerves II - XII - II - Visual field intact OU. III, IV, VI - Extraocular movements intact. V - Facial sensation intact bilaterally. VII - unable to assess  VIII - Hearing & vestibular intact bilaterally.  Motor Strength - Left hemibody is purposeful and antigravity., responds to noxious stimuli on right side with semipurposeful movements Motor Tone - Muscle tone was assessed at the neck and appendages and was normal  Sensory - responds to noxious stimuli bilaterally   Coordination - Unable to assess   Gait and Station - deferred.  ASSESSMENT/PLAN Mr. NOCHOLAS OUELLETTE is a 77 y.o. male with history of uncontrolled type 2 diabetes, atrial fibrillation on Eliquis, CAD s/p CABG, CVA, hypertension, hyperlipidemia, HFpEF, restless leg syndrome who presents to the ED 2/2 weakness and hyperglycemia was found to be aphasic  Stroke:  Acute left PCA ischemic infarct s/p balloon angioplasty of left vertebral junction, no stent or thrombectomy Etiology: Diffuse multivessel severe intracranial stenosis  CT head No acute abnormality.  CTA head & neck w Perf Multifocal short-segment occlusions of the left P1 and P2 segments with diminutive distal reconstitution.  20 cc ischemic penumbra identified some of which is in the left  occipital lobe  Cerebral angio Severe preocclusive stenosis of the left vertebral basilar junction at the origin of the left posterior inferior cerebellar artery. High-grade stenosis of the left posterior cerebral artery P2 segment, in the P3 segment. Status post endovascular revascularization of high-grade stenosis of the left vertebral junction with balloon angioplasty to a patency of 70  Post IR CT  no hemorrhage Repeat CT head No acute intracranial abnormality.  MRI  ordered 2D Echo EF 50-55%. LV Grade II diastolic dysfunction. mild concentric hypertrophy  LDL 30 HgbA1c 11.0 VTE prophylaxis - SCD's    Diet   Diet NPO time specified   Eliquis  prior to  admission, now on aspirin 81 mg daily. Currently receiving blood. Will restart when/if appropriate  Therapy recommendations:  pending Disposition:  pending  Hypertension Hypertensive Urgency  Home meds:  norvasc, losartan, metoprolol,  UnStable On Cleviprex gtt @ '14mg'$   Home meds on hold. Restart as appropriate  SBP 120-140 for 24 hrs s/p thrombectomy  Long-term BP goal normotensive  Hyperlipidemia Home meds:  zocor '80mg'$ , notresumed in hospital LDL 30, goal < 70 Continue statin at discharge  CAD s/p CABG x 3 (2017) Acute on chronic diastolic heart failure Paroxysmal atrial fibrillation Eliquis on hold due to RP bleed Cardiology following  Continue ASA   Diabetes type II UnControlled DKA Home meds:  insulin, metformin, semaglutide HgbA1c 11.0, goal < 7.0 Currently on insulin gtt- managed by CCM  CBGs Recent Labs    02/25/23 0534 02/25/23 0644 02/25/23 0740  GLUCAP 207* 168* 154*    SSI Will need close outpatient follow  Will need DM education when appropriate   Acute hypoxic respiratory failure  Intubated - management by CCM On prop gtt and fentanyl PRN CXR as needed   Dysphagia  OGT placed.  Start TF when appropriate  Bedside swallow when extubated  ST consult   Other Stroke Risk Factors Advanced Age >/= 72  Obesity, Body mass index is 34 kg/m., BMI >/= 30 associated with increased stroke risk, recommend weight loss, diet and exercise as appropriate  Hx stroke/TIA Coronary artery disease Congestive heart failure  Other Active Problems AKI- Cr 2.70->2.66 Hyponatremia Na 134 Acute blood loss- RP bleed, ? GI bleed - Hgb 7.0- transfused blood 2/29 Leukocytosis WBC 17.8   Hospital day # 1  Beulah Gandy DNP, ACNPC-AG  [facility]  STROKE MD NOTE :  I have personally obtained history,examined this patient, reviewed notes, independently viewed imaging studies, participated in medical decision making and plan of care.ROS completed by me  personally and pertinent positives fully documented  I have made any additions or clarifications directly to the above note. Agree with note above.  Patient with A-fib on Eliquis presented with altered mental status in the setting of diabetic ketoacidosis and anemia likely from retroperitoneum hematoma from a fall several days ago while on Eliquis.  He subsequently developed aphasia and was found to have severe left posterior cerebral artery occlusion and during attempts for thrombectomy was found to have preocclusive severe terminal left vertebral artery stenosis which required angioplasty.  He is critically ill and due to his severe anemia requiring blood transfusion not a candidate for antiplatelet therapy or more aggressive intervention at this time.  Hold aspirin and Eliquis for now.  Continue close neurological observation and strict blood pressure control with systolic goal 0000000 for close 24 hours and wean off sedation as tolerated.  Check MRI scan of the brain later when patient is stable enough for transport.  No family available at the bedside.  Long discussion with patient and Dr. Tacy Learn critical care medicine and answered questions.  Discussed with Dr. Estanislado Pandy neurointerventional radiology.This patient is critically ill and at significant risk of neurological worsening, death and care requires constant monitoring of vital signs, hemodynamics,respiratory and cardiac monitoring, extensive review of multiple databases, frequent neurological assessment, discussion with family, other specialists and medical decision making of high complexity.I have made any additions or clarifications directly to the above note.This critical care time does not reflect procedure time, or teaching time or supervisory time of PA/NP/Med Resident etc but could involve care discussion time.  I spent 40 minutes of neurocritical care time  in the care of  this patient.      Antony Contras, MD Medical Director [facility]  Stroke Center Pager: [phone] 02/25/2023 3:46 PM  To contact Stroke Continuity provider, please refer to [email or web address] After hours, contact General Neurology

## 2023-02-25 NOTE — Progress Notes (Signed)
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 Post revasc/angioplasty to 70% with improved flow through and beyond the PCA.  Other findings including right renal subcapsular hematoma and right 11th/12th rib fractuires noted.  Pt remains sedated on vent. Per RN, has been moving all 4 extremities.  Objective: Physical Exam: BP (!) 107/53   Pulse 71   Temp (!) 97.5 F (36.4 C) (Axillary)   Resp 20   Wt 250 lb 10.6 oz (113.7 kg)   SpO2 100%   BMI 34.00 kg/m  Intubated, sedated. Not responsive to commands, does try to withdraw to flashlight/pupil exam. (R)groin soft, NT, no hematoma Distal pulses dopplerable.   Current Facility-Administered Medications:     stroke: early stages of recovery book, , Does not apply, Once, Donnetta Simpers, MD   acetaminophen (TYLENOL) tablet 650 mg, 650 mg, Oral, Q4H PRN **OR** acetaminophen (TYLENOL) 160 MG/5ML solution 650 mg, 650 mg, Per Tube, Q4H PRN **OR** acetaminophen (TYLENOL) suppository 650 mg, 650 mg, Rectal, Q4H PRN, Deveshwar, Sanjeev, MD   aspirin chewable tablet 81 mg, 81 mg, Oral, Daily **OR** aspirin chewable tablet 81 mg, 81 mg, Per Tube, Daily, Deveshwar, Sanjeev, MD   Chlorhexidine Gluconate Cloth 2 % PADS 6 each, 6 each, Topical, Daily, Deveshwar, Sanjeev, MD, 6 each at 02/21/2023 2230   clevidipine (CLEVIPREX) infusion 0.5 mg/mL, 0-21 mg/hr, Intravenous, Continuous, Deveshwar, Sanjeev, MD, Last Rate: 28 mL/hr at 02/25/23 0900, 14 mg/hr at 02/25/23 0900   dextrose 50 % solution 0-50 mL, 0-50 mL, Intravenous, PRN, Santa Lighter, MD   docusate (COLACE) 50 MG/5ML liquid 100 mg, 100 mg, Per Tube, BID, Ayesha Rumpf, Stephanie M, PA-C   fentaNYL (SUBLIMAZE) bolus via infusion 25-100 mcg, 25-100 mcg, Intravenous, Q15 min PRN, Ayesha Rumpf, Stephanie M, PA-C   fentaNYL (SUBLIMAZE) injection 25 mcg, 25 mcg, Intravenous,  Once, Reese, Stephanie M, PA-C   fentaNYL 2536mg in NS 2537m(1074mml) infusion-PREMIX, 25-200 mcg/hr, Intravenous, Continuous, Reese, Stephanie M, PA-C   insulin regular, human (MYXREDLIN) 100 units/ 100 mL infusion, , Intravenous, Continuous, TurSanta LighterD, Last Rate: 4.2 mL/hr at 02/25/23 0900, 4.2 Units/hr at 02/25/23 0900   lactated ringers infusion, , Intravenous, Continuous, Mannam, Praveen, MD, Last Rate: 100 mL/hr at 02/25/23 0900, Infusion Verify at 02/25/23 0900   mupirocin ointment (BACTROBAN) 2 % 1 Application, 1 Application, Nasal, BID, OgaFrederik PearD, 1 Application at [id number]   Oral care mouth rinse, 15 mL, Mouth Rinse, Q2H, Deveshwar, Sanjeev, MD, 15 mL at 02/25/23 0743   Oral care mouth rinse, 15 mL, Mouth Rinse, PRN, Deveshwar, Sanjeev, MD   pantoprazole (PROTONIX) injection 40 mg, 40 mg, Intravenous, Q12H, Mannam, Praveen, MD, 40 mg at 02/02/2023 2337   polyethylene glycol (MIRALAX / GLYCOLAX) packet 17 g, 17 g, Per Tube, Daily, ReeAyesha Rumpftephanie M, PA-C   propofol (DIPRIVAN) 1000 MG/100ML infusion, , , ,    propofol (DIPRIVAN) 1000 MG/100ML infusion, 5-80 mcg/kg/min, Intravenous, Titrated, Ogan, Okoronkwo U, MD, Last Rate: 33 mL/hr at 02/25/23 0900, 50 mcg/kg/min at 02/25/23 0900  Labs: CBC Recent Labs    01/28/2023 2320 02/25/23 0403 02/25/23 0444 02/25/23 0537  WBC 15.8*  --   --  17.8*  HGB 9.1*   < > 7.8* 7.0*  HCT 27.0*   < > 23.0* 19.8*  PLT 164  --   --  171   < > = values in this interval not  displayed.   BMET Recent Labs    02/23/2023 2320 02/25/23 0403 02/25/23 0444 02/25/23 0537  NA 132*   < > 133* 134*  K 4.5   < > 4.1 4.4  CL 102  --   --  103  CO2 19*  --   --  19*  GLUCOSE 273*  --   --  188*  BUN 61*  --   --  58*  CREATININE 2.77*  --   --  2.66*  CALCIUM 7.4*  --   --  7.5*   < > = values in this interval not displayed.   LFT Recent Labs    02/11/2023 2320  PROT 5.9*  ALBUMIN 2.9*  AST 26  ALT 18  ALKPHOS 70   BILITOT 1.3*   PT/INR Recent Labs    02/15/2023 1806  LABPROT 18.2*  INR 1.5*     Studies/Results: DG Chest Port 1 View  Result Date: 02/25/2023 CLINICAL DATA:  Acute respiratory failure EXAM: PORTABLE CHEST 1 VIEW COMPARISON:  02/17/2023 FINDINGS: Endotracheal tube with tip at the clavicular heads. An enteric tube reaches the stomach. Artifact from EKG leads. Elevated right diaphragm with hazy infiltrate at the right base. No edema, effusion, or pneumothorax. Cardiomegaly.  Prior CABG. IMPRESSION: Stable hardware positioning and volume loss at the right lung base. Electronically Signed   By: Jorje Guild M.D.   On: 02/25/2023 05:27   CT HEAD WO CONTRAST (5MM)  Result Date: 02/25/2023 CLINICAL DATA:  Initial evaluation for acute mental status change. EXAM: CT HEAD WITHOUT CONTRAST TECHNIQUE: Contiguous axial images were obtained from the base of the skull through the vertex without intravenous contrast. RADIATION DOSE REDUCTION: This exam was performed according to the departmental dose-optimization program which includes automated exposure control, adjustment of the mA and/or kV according to patient size and/or use of iterative reconstruction technique. COMPARISON:  Prior studies from 02/03/2023. FINDINGS: Brain: Mild age-related cerebral atrophy. Remote ischemic change at the left basal ganglia/corona radiata. 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 No acute intracranial hemorrhage. No visible acute large vessel territory infarct. No mass lesion or midline shift. No hydrocephalus or extra-axial fluid collection. Vascular: Contrast material remains on board from recent procedures. Scattered calcified atherosclerosis present about the skull base. Skull: Scalp soft tissues and calvarium demonstrate no new finding. Sinuses/Orbits: Globes orbital soft tissues within normal limits. Scattered mucosal thickening present throughout the paranasal sinuses. Patient is intubated. Mastoid air  cells remain clear. Other: None. IMPRESSION: 1. No acute intracranial abnormality. 2. Chronic ischemic changes involving the left basal ganglia/corona radiata and left greater than right cerebellum, stable. Electronically Signed   By: Jeannine Boga M.D.   On: 02/25/2023 02:30   CT ABDOMEN PELVIS WO CONTRAST  Addendum Date: 02/25/2023   ADDENDUM REPORT: 02/25/2023 01:09 ADDENDUM: These results were called by telephone at the time of interpretation on 02/25/2023 at 1:09 am to provider [facility] , who verbally acknowledged these results. Electronically Signed   By: Fidela Salisbury M.D.   On: 02/25/2023 01:09   Result Date: 02/25/2023 CLINICAL DATA:  Retroperitoneal hemorrhage EXAM: CT ABDOMEN AND PELVIS WITHOUT CONTRAST TECHNIQUE: Multidetector CT imaging of the abdomen and pelvis was performed following the standard protocol without IV contrast. RADIATION DOSE REDUCTION: This exam was performed according to the departmental dose-optimization program which includes automated exposure control, adjustment of the mA and/or kV according to patient size and/or use of iterative reconstruction technique. COMPARISON:  None Available. FINDINGS: Lower chest: Bibasilar atelectasis. Extensive  coronary artery calcification. Nasogastric tube tip extends into the proximal body of the stomach. Hepatobiliary: No focal liver abnormality is seen. No gallstones, gallbladder wall thickening, or biliary dilatation. Pancreas: Unremarkable Spleen: Unremarkable Adrenals/Urinary Tract: The adrenal glands are unremarkable. The kidneys are normal in position. Contrast enhancement of the renal cortices and contrast within the renal collecting system is related to recent CT arteriography and endovascular procedure. A moderate right subcapsular hematoma is seen involving the mid lower pole the right kidney with moderate extension of hemorrhage into the posterior pararenal space. There are, additionally, punctate foci of gas along the  posteroinferior aspect of the subcapsular hematoma suggesting possible intervention, as can be seen with recent renal biopsy, or, less likely, superimposed infection. The left kidney is unremarkable. The bladder is decompressed and is unremarkable. Stomach/Bowel: Stomach is within normal limits. Appendix appears normal. No evidence of bowel wall thickening, distention, or inflammatory changes. Vascular/Lymphatic: Aortic atherosclerosis. No enlarged abdominal or pelvic lymph nodes. Reproductive: Prostate is unremarkable. Other: Mild asymmetric subcutaneous edema within the right flank may relate to trauma or dependent positioning. No abdominal wall hernia. Musculoskeletal: Acute fractures of the right eleventh and twelfth ribs are identified posteriorly. Moderate L1 compression deformity with 40-50% loss of height is seen likely remote in nature. No associated retropulsion. Degenerative changes are seen within the visualized thoracolumbar spine. IMPRESSION: 1. Moderate right renal subcapsular hematoma with moderate extension of hemorrhage into the posterior pararenal space. Punctate foci of gas along the posteroinferior aspect of the subcapsular hematoma suggesting possible intervention, as can be seen with recent renal biopsy, or, less likely, superimposed infection. Correlation for history of recent biopsy is recommended. 2. Acute fractures of the right eleventh and twelfth ribs posteriorly. 3. Extensive coronary artery calcification. 4. Moderate L1 compression deformity, likely remote in nature. 5. Aortic atherosclerosis. Aortic Atherosclerosis (ICD10-I70.0). Electronically Signed: By: Fidela Salisbury M.D. On: 02/25/2023 01:00   DG CHEST PORT 1 VIEW  Result Date: 02/25/2023 CLINICAL DATA:  Feeding tube placement.  Acute respiratory failure. EXAM: PORTABLE CHEST 1 VIEW, ABDOMEN PORTABLE ONE VIEW COMPARISON:  02/14/2023. FINDINGS: Heart is enlarged in the mediastinal contour is stable. The pulmonary vasculature  is distended. Lung volumes are low with minimal atelectasis at the lung bases bilaterally. There is a small right pleural effusion. No pneumothorax. Sternotomy wires are noted over the midline. The endotracheal tube terminates 4.9 cm above the carina. There is no evidence of bowel obstruction on limited view of the upper abdomen. The enteric tube terminates in the proximal stomach. IMPRESSION: 1. Cardiomegaly with mildly distended pulmonary vasculature. 2. Small right pleural effusion with atelectasis at the right lung base. 3. Support apparatus as detailed above. Electronically Signed   By: Brett Fairy M.D.   On: 02/25/2023 00:29   DG Abd Portable 1V  Result Date: 02/25/2023 CLINICAL DATA:  Feeding tube placement.  Acute respiratory failure. EXAM: PORTABLE CHEST 1 VIEW, ABDOMEN PORTABLE ONE VIEW COMPARISON:  02/16/2023. FINDINGS: Heart is enlarged in the mediastinal contour is stable. The pulmonary vasculature is distended. Lung volumes are low with minimal atelectasis at the lung bases bilaterally. There is a small right pleural effusion. No pneumothorax. Sternotomy wires are noted over the midline. The endotracheal tube terminates 4.9 cm above the carina. There is no evidence of bowel obstruction on limited view of the upper abdomen. The enteric tube terminates in the proximal stomach. IMPRESSION: 1. Cardiomegaly with mildly distended pulmonary vasculature. 2. Small right pleural effusion with atelectasis at the right lung base. 3. Support  apparatus as detailed above. Electronically Signed   By: Brett Fairy M.D.   On: 02/25/2023 00:29   CT ANGIO HEAD NECK W WO CM W PERF (CODE STROKE)  Result Date: 02/16/2023 CLINICAL DATA:  Concern for left MCA or PCA stroke. Decline in speech and right-sided weakness. EXAM: CT ANGIOGRAPHY HEAD AND NECK CT PERFUSION BRAIN TECHNIQUE: Multidetector CT imaging of the head and neck was performed using the standard protocol during bolus administration of intravenous  contrast. Multiplanar CT image reconstructions and MIPs were obtained to evaluate the vascular anatomy. Carotid stenosis measurements (when applicable) are obtained utilizing NASCET criteria, using the distal internal carotid diameter as the denominator. Multiphase CT imaging of the brain was performed following IV bolus contrast injection. Subsequent parametric perfusion maps were calculated using RAPID software. RADIATION DOSE REDUCTION: This exam was performed according to the departmental dose-optimization program which includes automated exposure control, adjustment of the mA and/or kV according to patient size and/or use of iterative reconstruction technique. CONTRAST:  121m OMNIPAQUE IOHEXOL 350 MG/ML SOLN COMPARISON:  Same-day CT head.  Carotid Doppler 03/19/2020. FINDINGS: CTA NECK FINDINGS Aortic arch: There is mild calcified plaque in the imaged aortic arch. The origins of the major branch vessels are patent. The subclavian arteries are patent to the level imaged. Right carotid system: The right common, internal, and external carotid arteries are patent with calcified plaque at the bifurcation resulting in less than 50% stenosis of the proximal internal carotid artery. The distal internal carotid artery is patent the external carotid artery is patent. There is no dissection or aneurysm. Left carotid system: The left common carotid artery is patent. There is bulky plaque at the bifurcation resulting in up to approximately 70% stenosis. The distal internal carotid artery is patent. The external carotid artery is patent. There is no evidence of dissection or aneurysm. Vertebral arteries: The vertebral arteries are patent, without hemodynamically significant stenosis or occlusion. There is no evidence of dissection or aneurysm Skeleton: There is no acute osseous abnormality or suspicious osseous lesion. There is no visible canal hematoma. Other neck: The soft tissues of the neck are unremarkable. Upper  chest: The imaged lung apices are clear. Review of the MIP images confirms the above findings CTA HEAD FINDINGS Anterior circulation: There is extensive calcified plaque in the carotid siphons resulting severe stenosis bilaterally. The left M1 segment is patent. There is short-segment severe stenosis of left M2 branches in the sylvian fissure proximally (8-117). There is no evidence of occlusion. There is moderate stenosis of the distal right M1 segment. There is focal moderate stenosis of a right M2 branch in the sylvian fissure (8-123). The distal branches are otherwise patent, without other proximal high-grade stenosis or occlusion. There is severe stenosis of the origin of the right A1 segment (6-162). Otherwise, the ACAs are patent, without other proximal high-grade stenosis or occlusion. The anterior communicating artery is normal. There is no aneurysm or AVM. Posterior circulation: There is calcified plaque in the bilateral V4 segments resulting severe stenosis bilaterally. The PICA origins are not identified. The basilar artery is patent with moderate stenosis distally ([id number]. There is multifocal short-segment occlusion of the left P1 and P2 segments (8-134, 141). There is intermittent diminutive reconstitution of flow in the distal branches. There is a fetal origin of the right PCA. There is severe stenosis of the proximal P2 segment (8-137). There is multifocal irregularity and narrowing of the distal branches without evidence of occlusion. There is no aneurysm or AVM. Venous sinuses: Patent.  Anatomic variants: None. Review of the MIP images confirms the above findings CT Brain Perfusion Findings: ASPECTS: 10 on the head CT obtained 5:18 p.m. CBF (<30%) Volume: 57m Perfusion (Tmax>6.0s) volume: 246mMismatch Volume: 205mnfarction Location:No infarct core is identified. A portion of the ischemic brain is identified in the left occipital lobe which would correspond with the PCA occlusion. IMPRESSION: 1.  Multifocal short-segment occlusions of the left P1 and P2 segments with diminutive distal reconstitution. 2. 20 cc ischemic penumbra identified some of which is in the left occipital lobe which would correspond to the PCA occlusion. No core infarct identified. 3. Additional advanced intracranial atherosclerotic disease with multifocal severe stenoses in the anterior and posterior circulations as detailed above. 4. Bulky calcified plaque at the left carotid bifurcation resulting in approximately 70% stenosis of the proximal internal carotid artery. Mild plaque on the right without greater than 50% stenosis. Patent vertebral arteries in the neck. Findings discussed with Dr. KirLeonel Ramsay 5:58 p.m. Electronically Signed   By: PetValetta MoleD.   On: 02/19/2023 18:31   CT HEAD WO CONTRAST (5MM)  Result Date: 02/10/2023 CLINICAL DATA:  Acute stroke suspected, weakness, hyperglycemia EXAM: CT HEAD WITHOUT CONTRAST TECHNIQUE: Contiguous axial images were obtained from the base of the skull through the vertex without intravenous contrast. RADIATION DOSE REDUCTION: This exam was performed according to the departmental dose-optimization program which includes automated exposure control, adjustment of the mA and/or kV according to patient size and/or use of iterative reconstruction technique. COMPARISON:  03/18/2020 FINDINGS: Brain: No evidence of acute infarction, hemorrhage, hydrocephalus, extra-axial collection or mass lesion/mass effect. Unchanged subtle white matter hypodensity of the left corona radiata (series 2, image 18). Unchanged encephalomalacia of the cerebellar hemispheres. Vascular: No hyperdense vessel or unexpected calcification. Skull: Normal. Negative for fracture or focal lesion. Sinuses/Orbits: No acute finding. Other: None. IMPRESSION: 1. No acute intracranial pathology. 2. Unchanged subtle white matter hypodensity of the left corona radiata and encephalomalacia of the cerebellar hemispheres in  keeping with prior infarctions. Electronically Signed   By: AleDelanna AhmadiD.   On: 02/19/2023 17:37   DG Chest Portable 1 View  Result Date: 02/23/2023 CLINICAL DATA:  Shortness of breath. EXAM: PORTABLE CHEST 1 VIEW COMPARISON:  12/17/2022 FINDINGS: Previous median sternotomy and CABG procedure. Stable cardiac enlargement. Lung volumes are low. Slight asymmetric elevation of the right hemidiaphragm with blunting of the right costophrenic angle. No interstitial edema or airspace disease. Visualized osseous structures appear intact. No displaced rib fractures identified. IMPRESSION: 1. Suspect small right pleural effusion. 2. Low lung volumes. 3. Stable cardiac enlargement. Electronically Signed   By: TayKerby MoorsD.   On: 02/19/2023 13:17    Assessment/Plan: S/p Left VBJ angioplasty. Cont ASA only. Further neuro assessment once extubated.    LOS: 1 day   I spent a total of 15 minutes in face to face in clinical consultation, greater than 50% of which was counseling/coordinating care for CVA post intervention  KevAscencion Dike-C 02/25/2023 9:21 AM

## 2023-02-25 NOTE — Progress Notes (Signed)
Upon pausing sedation for an extended amount of time, pt still was not waking to painful stimuli.  Pt also did not have a cough or gag, but his pupils were still 11m round, sluggish.  Dr. KLorrin Goodellon floor.  MD notified.  STAT head CT ordered.

## 2023-02-26 ENCOUNTER — Inpatient Hospital Stay (HOSPITAL_COMMUNITY): Payer: Medicare Other

## 2023-02-26 DIAGNOSIS — I63432 Cerebral infarction due to embolism of left posterior cerebral artery: Secondary | ICD-10-CM

## 2023-02-26 DIAGNOSIS — I6509 Occlusion and stenosis of unspecified vertebral artery: Secondary | ICD-10-CM | POA: Diagnosis not present

## 2023-02-26 DIAGNOSIS — I651 Occlusion and stenosis of basilar artery: Secondary | ICD-10-CM | POA: Diagnosis not present

## 2023-02-26 LAB — BPAM RBC
Blood Product Expiration Date: 202403162359
Blood Product Expiration Date: 202403172359
Blood Product Expiration Date: 202403172359
Blood Product Expiration Date: 202403172359
Blood Product Expiration Date: 202403192359
Blood Product Expiration Date: 202403192359
ISSUE DATE / TIME: 202402282047
ISSUE DATE / TIME: 202402282047
ISSUE DATE / TIME: 202402282047
ISSUE DATE / TIME: 202402290836
ISSUE DATE / TIME: 202402291017
ISSUE DATE / TIME: 202402291435
Unit Type and Rh: 6200
Unit Type and Rh: 6200
Unit Type and Rh: 6200
Unit Type and Rh: 6200
Unit Type and Rh: 6200
Unit Type and Rh: 6200

## 2023-02-26 LAB — TYPE AND SCREEN
ABO/RH(D): A POS
Antibody Screen: NEGATIVE
Unit division: 0
Unit division: 0
Unit division: 0
Unit division: 0
Unit division: 0
Unit division: 0

## 2023-02-26 LAB — GLUCOSE, CAPILLARY
Glucose-Capillary: 177 mg/dL — ABNORMAL HIGH (ref 70–99)
Glucose-Capillary: 200 mg/dL — ABNORMAL HIGH (ref 70–99)
Glucose-Capillary: 194 mg/dL — ABNORMAL HIGH (ref 70–99)
Glucose-Capillary: 196 mg/dL — ABNORMAL HIGH (ref 70–99)
Glucose-Capillary: 194 mg/dL — ABNORMAL HIGH (ref 70–99)
Glucose-Capillary: 202 mg/dL — ABNORMAL HIGH (ref 70–99)
Glucose-Capillary: 198 mg/dL — ABNORMAL HIGH (ref 70–99)
Glucose-Capillary: 206 mg/dL — ABNORMAL HIGH (ref 70–99)
Glucose-Capillary: 151 mg/dL — ABNORMAL HIGH (ref 70–99)
Glucose-Capillary: 218 mg/dL — ABNORMAL HIGH (ref 70–99)
Glucose-Capillary: 218 mg/dL — ABNORMAL HIGH (ref 70–99)
Glucose-Capillary: 152 mg/dL — ABNORMAL HIGH (ref 70–99)
Glucose-Capillary: 178 mg/dL — ABNORMAL HIGH (ref 70–99)

## 2023-02-26 LAB — URINALYSIS, ROUTINE W REFLEX MICROSCOPIC
Bilirubin Urine: NEGATIVE
Glucose, UA: NEGATIVE mg/dL
Ketones, ur: 5 mg/dL — AB
Nitrite: NEGATIVE
Protein, ur: 100 mg/dL — AB
RBC / HPF: >50 RBC/hpf (ref 0–5)
Specific Gravity, Urine: 1.038 — ABNORMAL HIGH (ref 1.005–1.030)
pH: 5 (ref 5.0–8.0)

## 2023-02-26 LAB — COMPREHENSIVE METABOLIC PANEL
ALT: 10 U/L (ref 0–44)
AST: 31 U/L (ref 15–41)
Albumin: 2.3 g/dL — ABNORMAL LOW (ref 3.5–5.0)
Alkaline Phosphatase: 62 U/L (ref 38–126)
Anion gap: 11 (ref 5–15)
BUN: 62 mg/dL — ABNORMAL HIGH (ref 8–23)
CO2: 19 mmol/L — ABNORMAL LOW (ref 22–32)
Calcium: 7.5 mg/dL — ABNORMAL LOW (ref 8.9–10.3)
Chloride: 102 mmol/L (ref 98–111)
Creatinine, Ser: 3.37 mg/dL — ABNORMAL HIGH (ref 0.61–1.24)
GFR, Estimated: 18 mL/min — ABNORMAL LOW (ref >60)
Glucose, Bld: 200 mg/dL — ABNORMAL HIGH (ref 70–99)
Potassium: 5 mmol/L (ref 3.5–5.1)
Sodium: 132 mmol/L — ABNORMAL LOW (ref 135–145)
Total Bilirubin: 1.3 mg/dL — ABNORMAL HIGH (ref 0.3–1.2)
Total Protein: 5 g/dL — ABNORMAL LOW (ref 6.5–8.1)

## 2023-02-26 LAB — CBC
HCT: 28.9 % — ABNORMAL LOW (ref 39.0–52.0)
Hemoglobin: 10 g/dL — ABNORMAL LOW (ref 13.0–17.0)
MCH: 30.5 pg (ref 26.0–34.0)
MCHC: 34.6 g/dL (ref 30.0–36.0)
MCV: 88.1 fL (ref 80.0–100.0)
Platelets: 154 10*3/uL (ref 150–400)
RBC: 3.28 MIL/uL — ABNORMAL LOW (ref 4.22–5.81)
RDW: 17.6 % — ABNORMAL HIGH (ref 11.5–15.5)
WBC: 12.4 10*3/uL — ABNORMAL HIGH (ref 4.0–10.5)
nRBC: 0 % (ref 0.0–0.2)

## 2023-02-26 LAB — HEMOGLOBIN AND HEMATOCRIT, BLOOD
HCT: 28.2 % — ABNORMAL LOW (ref 39.0–52.0)
Hemoglobin: 9.9 g/dL — ABNORMAL LOW (ref 13.0–17.0)

## 2023-02-26 LAB — PROTIME-INR
INR: 1.2 (ref 0.8–1.2)
Prothrombin Time: 15.3 seconds — ABNORMAL HIGH (ref 11.4–15.2)

## 2023-02-26 LAB — PHOSPHORUS
Phosphorus: 6 mg/dL — ABNORMAL HIGH (ref 2.5–4.6)
Phosphorus: 6.8 mg/dL — ABNORMAL HIGH (ref 2.5–4.6)

## 2023-02-26 LAB — MAGNESIUM
Magnesium: 3 mg/dL — ABNORMAL HIGH (ref 1.7–2.4)
Magnesium: 2.6 mg/dL — ABNORMAL HIGH (ref 1.7–2.4)

## 2023-02-26 LAB — VANCOMYCIN, RANDOM: Vancomycin Rm: 12 ug/mL

## 2023-02-26 LAB — HEMOGLOBIN A1C
Hgb A1c MFr Bld: 8.6 % — ABNORMAL HIGH (ref 4.8–5.6)
Mean Plasma Glucose: 200 mg/dL

## 2023-02-26 LAB — TRIGLYCERIDES: Triglycerides: 666 mg/dL — ABNORMAL HIGH (ref <150)

## 2023-02-26 MED ORDER — LABETALOL HCL 5 MG/ML IV SOLN
10.0000 mg | INTRAVENOUS | Status: DC | PRN
  Administered 2023-02-26: 10 mg via INTRAVENOUS
  Filled 2023-02-26 (×2): qty 4

## 2023-02-26 MED ORDER — LACTATED RINGERS IV SOLN: INTRAVENOUS | Status: DC

## 2023-02-26 MED ORDER — POLYETHYLENE GLYCOL 3350 17 G PO PACK
17.0000 g | PACK | Freq: Two times a day (BID) | ORAL | Status: DC
  Administered 2023-02-26 – 2023-03-01 (×6): 17 g
  Filled 2023-02-26 (×5): qty 1

## 2023-02-26 MED ORDER — MIDAZOLAM-SODIUM CHLORIDE 100-0.9 MG/100ML-% IV SOLN
0.5000 mg/h | INTRAVENOUS | Status: DC
  Administered 2023-02-26: 5 mg/h via INTRAVENOUS
  Filled 2023-02-26: qty 100

## 2023-02-26 MED ORDER — VANCOMYCIN VARIABLE DOSE PER UNSTABLE RENAL FUNCTION (PHARMACIST DOSING): Status: DC

## 2023-02-26 MED ORDER — FUROSEMIDE 10 MG/ML IJ SOLN
60.0000 mg | Freq: Once | INTRAMUSCULAR | Status: AC
  Administered 2023-02-26: 60 mg via INTRAVENOUS
  Filled 2023-02-26: qty 6

## 2023-02-26 MED ORDER — MIDAZOLAM HCL 2 MG/2ML IJ SOLN
1.0000 mg | INTRAMUSCULAR | Status: DC | PRN
  Administered 2023-02-26 – 2023-02-27 (×9): 2 mg via INTRAVENOUS
  Filled 2023-02-26 (×8): qty 2

## 2023-02-26 MED ORDER — METRONIDAZOLE 500 MG/100ML IV SOLN
500.0000 mg | Freq: Two times a day (BID) | INTRAVENOUS | Status: DC
  Administered 2023-02-26 – 2023-02-28 (×6): 500 mg via INTRAVENOUS
  Filled 2023-02-26 (×7): qty 100

## 2023-02-26 MED ORDER — VANCOMYCIN HCL 2000 MG/400ML IV SOLN
2000.0000 mg | Freq: Once | INTRAVENOUS | Status: AC
  Administered 2023-02-26: 2000 mg via INTRAVENOUS
  Filled 2023-02-26: qty 400

## 2023-02-26 MED ORDER — NICARDIPINE HCL IN NACL 20-0.86 MG/200ML-% IV SOLN
3.0000 mg/h | INTRAVENOUS | Status: DC
  Filled 2023-02-26: qty 200

## 2023-02-26 MED ORDER — INSULIN ASPART 100 UNIT/ML IJ SOLN
0.0000 [IU] | INTRAMUSCULAR | Status: DC
  Administered 2023-02-26: 4 [IU] via SUBCUTANEOUS
  Administered 2023-02-26: 7 [IU] via SUBCUTANEOUS
  Administered 2023-02-26: 4 [IU] via SUBCUTANEOUS
  Administered 2023-02-26: 7 [IU] via SUBCUTANEOUS
  Administered 2023-02-26: 4 [IU] via SUBCUTANEOUS
  Administered 2023-02-27: 3 [IU] via SUBCUTANEOUS
  Administered 2023-02-27 – 2023-02-28 (×3): 4 [IU] via SUBCUTANEOUS
  Administered 2023-02-28: 3 [IU] via SUBCUTANEOUS
  Administered 2023-02-28 – 2023-03-01 (×6): 4 [IU] via SUBCUTANEOUS
  Administered 2023-03-01: 3 [IU] via SUBCUTANEOUS

## 2023-02-26 MED ORDER — SODIUM CHLORIDE 0.9 % IV SOLN
2.0000 g | INTRAVENOUS | Status: DC
  Administered 2023-02-26 – 2023-03-01 (×4): 2 g via INTRAVENOUS
  Filled 2023-02-26 (×4): qty 20

## 2023-02-26 MED ORDER — ALBUMIN HUMAN 25 % IV SOLN
25.0000 g | Freq: Four times a day (QID) | INTRAVENOUS | Status: AC
  Administered 2023-02-26 – 2023-02-27 (×3): 25 g via INTRAVENOUS
  Filled 2023-02-26 (×3): qty 100

## 2023-02-26 MED ORDER — SODIUM CHLORIDE 0.9 % IV SOLN: INTRAVENOUS | Status: DC | PRN

## 2023-02-26 MED ORDER — DEXTROSE 10 % IV SOLN: INTRAVENOUS | Status: DC | PRN

## 2023-02-26 MED ORDER — INSULIN GLARGINE-YFGN 100 UNIT/ML ~~LOC~~ SOLN
50.0000 [IU] | SUBCUTANEOUS | Status: DC
  Administered 2023-02-26: 50 [IU] via SUBCUTANEOUS
  Filled 2023-02-26 (×2): qty 0.5

## 2023-02-26 MED ORDER — INSULIN ASPART 100 UNIT/ML IJ SOLN
8.0000 [IU] | INTRAMUSCULAR | Status: DC
  Administered 2023-02-26 – 2023-02-27 (×4): 8 [IU] via SUBCUTANEOUS

## 2023-02-26 MED ORDER — DEXTROSE 10 % IV SOLN: INTRAVENOUS | Status: DC

## 2023-02-26 MED ORDER — MIDAZOLAM HCL 2 MG/2ML IJ SOLN
INTRAMUSCULAR | Status: AC
  Filled 2023-02-26: qty 2

## 2023-02-26 MED ORDER — BISACODYL 10 MG RE SUPP
10.0000 mg | Freq: Once | RECTAL | Status: AC
  Administered 2023-02-26: 10 mg via RECTAL
  Filled 2023-02-26: qty 1

## 2023-02-26 MED ORDER — DEXMEDETOMIDINE HCL IN NACL 400 MCG/100ML IV SOLN
0.0000 ug/kg/h | INTRAVENOUS | Status: DC
  Administered 2023-02-26: 0.5 ug/kg/h via INTRAVENOUS
  Administered 2023-02-26: 0.4 ug/kg/h via INTRAVENOUS
  Administered 2023-02-26 – 2023-02-27 (×3): 1.5 ug/kg/h via INTRAVENOUS
  Administered 2023-02-27: 1 ug/kg/h via INTRAVENOUS
  Administered 2023-02-27 (×2): 1.5 ug/kg/h via INTRAVENOUS
  Administered 2023-02-27: 0.9 ug/kg/h via INTRAVENOUS
  Administered 2023-02-27: 1.5 ug/kg/h via INTRAVENOUS
  Administered 2023-02-27: 1.8 ug/kg/h via INTRAVENOUS
  Administered 2023-02-28 (×6): 0.9 ug/kg/h via INTRAVENOUS
  Administered 2023-03-01 (×4): 1 ug/kg/h via INTRAVENOUS
  Filled 2023-02-26 (×19): qty 100
  Filled 2023-02-26: qty 200
  Filled 2023-02-26: qty 100

## 2023-02-26 NOTE — Progress Notes (Signed)
SLP Cancellation Note  Patient Details Name: [name] MRN: [id number] DOB: 1946-05-04   Cancelled treatment:       Reason Eval/Treat Not Completed: Patient not medically ready (on vent this am). Will f/u as able.   Osie Bond., M.A. [place] Office [phone]  Secure chat preferred  02/26/2023, 7:43 AM

## 2023-02-26 NOTE — Progress Notes (Signed)
Rounding Note    Patient Name: [name] Date of Encounter: 02/26/2023  [place] Cardiologist: [name] clinic)  Subjective   Patient remains intubated, sedated   Inpatient Medications    Scheduled Meds:  aspirin  81 mg Oral Daily   Or   aspirin  81 mg Per Tube Daily   Chlorhexidine Gluconate Cloth  6 each Topical Daily   docusate  100 mg Per Tube BID   feeding supplement (PROSource TF20)  60 mL Per Tube Daily   mupirocin ointment  1 Application Nasal BID   mouth rinse  15 mL Mouth Rinse Q2H   pantoprazole (PROTONIX) IV  40 mg Intravenous Q12H   polyethylene glycol  17 g Per Tube Daily   Continuous Infusions:  clevidipine 20 mg/hr (02/26/23 0603)   dextrose 5% lactated ringers 100 mL/hr at 02/26/23 0607   feeding supplement (VITAL AF 1.2 CAL) 40 mL/hr at 02/26/23 0600   fentaNYL infusion INTRAVENOUS 150 mcg/hr (02/26/23 0600)   insulin 7 Units/hr (02/26/23 0640)   midazolam 5 mg/hr (02/26/23 0651)   PRN Meds: acetaminophen **OR** acetaminophen (TYLENOL) oral liquid 160 mg/5 mL **OR** acetaminophen, dextrose, fentaNYL, hydrALAZINE, labetalol, mouth rinse   Vital Signs    Vitals:   02/26/23 0545 02/26/23 0600 02/26/23 0615 02/26/23 0630  BP:  (!) 102/53    Pulse: 75 76 76 77  Resp: '16 18 17 17  '$ Temp:      TempSrc:      SpO2: 97% 97% 97% 97%  Weight:        Intake/Output Summary (Last 24 hours) at 02/26/2023 0659 Last data filed at 02/26/2023 0600 Gross per 24 hour  Intake 5321.81 ml  Output 685 ml  Net 4636.81 ml      02/25/2023    5:30 AM 02/18/2023   11:00 PM 02/10/2023   11:40 AM  Last 3 Weights  Weight (lbs) 250 lb 10.6 oz 243 lb 9.7 oz 237 lb 11.2 oz  Weight (kg) 113.7 kg 110.5 kg 107.82 kg      Telemetry    SR  - Personally Reviewed  ECG     No new - Personally Reviewed  Physical Exam   GEN: Intubated  Sedated  Neck: JVP does not appear elevated  Cardiac: RRR, no murmur Respiratory: Clear to  auscultation bilaterally.anteriorly  GI: Soft,  MS: No edema; Feet warm.  S/p amputation toes R  Labs    High Sensitivity Troponin:   Recent Labs  Lab 02/02/2023 1143 01/28/2023 2320 02/25/23 0213  TROPONINIHS 2,280* 2,894* 3,251*     Chemistry Recent Labs  Lab 02/18/2023 1143 02/14/2023 1421 01/30/2023 2320 02/25/23 0403 02/25/23 0444 02/25/23 0537 02/25/23 1636 02/26/23 0433  NA 122*   < > 132*   < > 133* 134*  --  132*  K 6.3*   < > 4.5   < > 4.1 4.4  --  5.0  CL 92*   < > 102  --   --  103  --  102  CO2 16*   < > 19*  --   --  19*  --  19*  GLUCOSE 787*   < > 273*  --   --  188*  --  200*  BUN 70*   < > 61*  --   --  58*  --  62*  CREATININE 2.97*   < > 2.77*  --   --  2.66*  --  3.37*  CALCIUM 7.6*   < >  7.4*  --   --  7.5*  --  7.5*  MG  --   --   --   --   --   --  2.9* 3.0*  PROT 6.5  --  5.9*  --   --   --   --  5.0*  ALBUMIN 3.3*  --  2.9*  --   --   --   --  2.3*  AST 27  --  26  --   --   --   --  31  ALT 20  --  18  --   --   --   --  10  ALKPHOS 77  --  70  --   --   --   --  62  BILITOT 1.5*  --  1.3*  --   --   --   --  1.3*  GFRNONAA 21*   < > 23*  --   --  24*  --  18*  ANIONGAP 14   < > 11  --   --  12  --  11   < > = values in this interval not displayed.    Lipids  Recent Labs  Lab 02/25/23 0537 02/26/23 0433  CHOL 126  --   TRIG 337* 666*  HDL 29*  --   LDLCALC 30  --   CHOLHDL 4.3  --     Hematology Recent Labs  Lab 02/21/2023 2320 02/25/23 0403 02/25/23 0537 02/25/23 1333 02/25/23 1932 02/25/23 2330 02/26/23 0433  WBC 15.8*  --  17.8*  --   --   --  12.4*  RBC 3.10*  --  2.27*  --   --   --  3.28*  HGB 9.1*   < > 7.0*   < > 9.8* 9.9* 10.0*  HCT 27.0*   < > 19.8*   < > 28.0* 28.2* 28.9*  MCV 87.1  --  87.2  --   --   --  88.1  MCH 29.4  --  30.8  --   --   --  30.5  MCHC 33.7  --  35.4  --   --   --  34.6  RDW 17.1*  --  17.0*  --   --   --  17.6*  PLT 164  --  171  --   --   --  154   < > = values in this interval not displayed.    Thyroid No results for input(s): "TSH", "FREET4" in the last 168 hours.  BNP Recent Labs  Lab 02/20/2023 1143  BNP 685.7*    DDimer No results for input(s): "DDIMER" in the last 168 hours.   Radiology    ECHOCARDIOGRAM COMPLETE  Result Date: 02/25/2023    ECHOCARDIOGRAM REPORT   Patient Name:   [name] Date of Exam: 02/25/2023 Medical Rec #:  [id number]             Height:       72.0 in Accession #:    [id number]            Weight:       250.7 lb Date of Birth:  1946/04/01             BSA:          2.345 m Patient Age:    77 years              BP:  107/53 mmHg Patient Gender: M                     HR:           73 bpm. Exam Location:  Inpatient Procedure: 2D Echo, Cardiac Doppler and Color Doppler Indications:    Stroke  History:        Patient has prior history of Echocardiogram examinations. CAD,                 Prior CABG, Arrythmias:Atrial Fibrillation; Risk                 Factors:Hypertension and Diabetes.  Sonographer:    Danne Baxter RDCS, FE, PE Referring Phys: [id number] [place]  1. Left ventricular ejection fraction, by estimation, is 50 to 55%. The left ventricle has low normal function. The left ventricle demonstrates regional wall motion abnormalities (see scoring diagram/findings for description). There is mild concentric left ventricular hypertrophy. Left ventricular diastolic parameters are consistent with Grade II diastolic dysfunction (pseudonormalization).  2. Right ventricular systolic function is normal. The right ventricular size is normal.  3. The mitral valve is normal in structure. No evidence of mitral valve regurgitation. No evidence of mitral stenosis.  4. The aortic valve is normal in structure. Aortic valve regurgitation is not visualized. Aortic valve sclerosis is present, with no evidence of aortic valve stenosis.  5. There is borderline dilatation of the ascending aorta, measuring 36 mm.  6. The inferior vena cava is normal in  size with greater than 50% respiratory variability, suggesting right atrial pressure of 3 mmHg. FINDINGS  Left Ventricle: Left ventricular ejection fraction, by estimation, is 50 to 55%. The left ventricle has low normal function. The left ventricle demonstrates regional wall motion abnormalities. The left ventricular internal cavity size was normal in size. There is mild concentric left ventricular hypertrophy. Left ventricular diastolic parameters are consistent with Grade II diastolic dysfunction (pseudonormalization).  LV Wall Scoring: The basal inferolateral segment and mid inferior segment are akinetic. The entire anterior wall, antero-lateral wall, mid and distal lateral wall, entire septum, entire apex, and basal inferior segment are hypokinetic. Right Ventricle: The right ventricular size is normal. No increase in right ventricular wall thickness. Right ventricular systolic function is normal. Left Atrium: Left atrial size was normal in size. Right Atrium: Right atrial size was normal in size. Pericardium: There is no evidence of pericardial effusion. Presence of epicardial fat layer. Mitral Valve: The mitral valve is normal in structure. No evidence of mitral valve regurgitation. No evidence of mitral valve stenosis. Tricuspid Valve: The tricuspid valve is normal in structure. Tricuspid valve regurgitation is mild . No evidence of tricuspid stenosis. Aortic Valve: The aortic valve is normal in structure. Aortic valve regurgitation is not visualized. Aortic valve sclerosis is present, with no evidence of aortic valve stenosis. Pulmonic Valve: The pulmonic valve was normal in structure. Pulmonic valve regurgitation is not visualized. No evidence of pulmonic stenosis. Aorta: The aortic root is normal in size and structure. There is borderline dilatation of the ascending aorta, measuring 36 mm. Venous: The inferior vena cava is normal in size with greater than 50% respiratory variability, suggesting right  atrial pressure of 3 mmHg. IAS/Shunts: No atrial level shunt detected by color flow Doppler.  LEFT VENTRICLE PLAX 2D LVIDd:         5.20 cm   Diastology LVIDs:         3.50 cm   LV e' medial:  8.08 cm/s LV PW:         1.00 cm   LV E/e' medial:  14.2 LV IVS:        1.20 cm   LV e' lateral:   9.48 cm/s LVOT diam:     2.10 cm   LV E/e' lateral: 12.1 LV SV:         84 LV SV Index:   36 LVOT Area:     3.46 cm  RIGHT VENTRICLE RV S prime:     9.17 cm/s TAPSE (M-mode): 1.3 cm LEFT ATRIUM             Index        RIGHT ATRIUM           Index LA diam:        3.70 cm 1.58 cm/m   RA Area:     19.40 cm LA Vol (A2C):   42.7 ml 18.21 ml/m  RA Volume:   51.60 ml  22.00 ml/m LA Vol (A4C):   70.1 ml 29.89 ml/m LA Biplane Vol: 56.0 ml 23.88 ml/m  AORTIC VALVE LVOT Vmax:   122.00 cm/s LVOT Vmean:  80.800 cm/s LVOT VTI:    0.243 m  AORTA Ao Root diam: 3.30 cm Ao Asc diam:  3.60 cm MITRAL VALVE                TRICUSPID VALVE MV Area (PHT): 3.13 cm     TR Peak grad:   12.0 mmHg MV Decel Time: 242 msec     TR Vmax:        173.00 cm/s MV E velocity: 115.00 cm/s MV A velocity: 90.30 cm/s   SHUNTS MV E/A ratio:  1.27         Systemic VTI:  0.24 m                             Systemic Diam: 2.10 cm Kardie Tobb DO Electronically signed by Berniece Salines DO Signature Date/Time: 02/25/2023/10:21:33 AM    Final    DG Chest Port 1 View  Result Date: 02/25/2023 CLINICAL DATA:  Acute respiratory failure EXAM: PORTABLE CHEST 1 VIEW COMPARISON:  02/15/2023 FINDINGS: Endotracheal tube with tip at the clavicular heads. An enteric tube reaches the stomach. Artifact from EKG leads. Elevated right diaphragm with hazy infiltrate at the right base. No edema, effusion, or pneumothorax. Cardiomegaly.  Prior CABG. IMPRESSION: Stable hardware positioning and volume loss at the right lung base. Electronically Signed   By: Jorje Guild M.D.   On: 02/25/2023 05:27   CT HEAD WO CONTRAST (5MM)  Result Date: 02/25/2023 CLINICAL DATA:  Initial  evaluation for acute mental status change. EXAM: CT HEAD WITHOUT CONTRAST TECHNIQUE: Contiguous axial images were obtained from the base of the skull through the vertex without intravenous contrast. RADIATION DOSE REDUCTION: This exam was performed according to the departmental dose-optimization program which includes automated exposure control, adjustment of the mA and/or kV according to patient size and/or use of iterative reconstruction technique. COMPARISON:  Prior studies from 02/06/2023. FINDINGS: Brain: Mild age-related cerebral atrophy. Remote ischemic change at the left basal ganglia/corona radiata. 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 No acute intracranial hemorrhage. No visible acute large vessel territory infarct. No mass lesion or midline shift. No hydrocephalus or extra-axial fluid collection. Vascular: Contrast material remains on board from recent procedures. Scattered calcified atherosclerosis present about the skull base. Skull: Scalp soft tissues and  calvarium demonstrate no new finding. Sinuses/Orbits: Globes orbital soft tissues within normal limits. Scattered mucosal thickening present throughout the paranasal sinuses. Patient is intubated. Mastoid air cells remain clear. Other: None. IMPRESSION: 1. No acute intracranial abnormality. 2. Chronic ischemic changes involving the left basal ganglia/corona radiata and left greater than right cerebellum, stable. Electronically Signed   By: Jeannine Boga M.D.   On: 02/25/2023 02:30   CT ABDOMEN PELVIS WO CONTRAST  Addendum Date: 02/25/2023   ADDENDUM REPORT: 02/25/2023 01:09 ADDENDUM: These results were called by telephone at the time of interpretation on 02/25/2023 at 1:09 am to provider [facility] , who verbally acknowledged these results. Electronically Signed   By: Fidela Salisbury M.D.   On: 02/25/2023 01:09   Result Date: 02/25/2023 CLINICAL DATA:  Retroperitoneal hemorrhage EXAM: CT ABDOMEN AND PELVIS WITHOUT  CONTRAST TECHNIQUE: Multidetector CT imaging of the abdomen and pelvis was performed following the standard protocol without IV contrast. RADIATION DOSE REDUCTION: This exam was performed according to the departmental dose-optimization program which includes automated exposure control, adjustment of the mA and/or kV according to patient size and/or use of iterative reconstruction technique. COMPARISON:  None Available. FINDINGS: Lower chest: Bibasilar atelectasis. Extensive coronary artery calcification. Nasogastric tube tip extends into the proximal body of the stomach. Hepatobiliary: No focal liver abnormality is seen. No gallstones, gallbladder wall thickening, or biliary dilatation. Pancreas: Unremarkable Spleen: Unremarkable Adrenals/Urinary Tract: The adrenal glands are unremarkable. The kidneys are normal in position. Contrast enhancement of the renal cortices and contrast within the renal collecting system is related to recent CT arteriography and endovascular procedure. A moderate right subcapsular hematoma is seen involving the mid lower pole the right kidney with moderate extension of hemorrhage into the posterior pararenal space. There are, additionally, punctate foci of gas along the posteroinferior aspect of the subcapsular hematoma suggesting possible intervention, as can be seen with recent renal biopsy, or, less likely, superimposed infection. The left kidney is unremarkable. The bladder is decompressed and is unremarkable. Stomach/Bowel: Stomach is within normal limits. Appendix appears normal. No evidence of bowel wall thickening, distention, or inflammatory changes. Vascular/Lymphatic: Aortic atherosclerosis. No enlarged abdominal or pelvic lymph nodes. Reproductive: Prostate is unremarkable. Other: Mild asymmetric subcutaneous edema within the right flank may relate to trauma or dependent positioning. No abdominal wall hernia. Musculoskeletal: Acute fractures of the right eleventh and twelfth  ribs are identified posteriorly. Moderate L1 compression deformity with 40-50% loss of height is seen likely remote in nature. No associated retropulsion. Degenerative changes are seen within the visualized thoracolumbar spine. IMPRESSION: 1. Moderate right renal subcapsular hematoma with moderate extension of hemorrhage into the posterior pararenal space. Punctate foci of gas along the posteroinferior aspect of the subcapsular hematoma suggesting possible intervention, as can be seen with recent renal biopsy, or, less likely, superimposed infection. Correlation for history of recent biopsy is recommended. 2. Acute fractures of the right eleventh and twelfth ribs posteriorly. 3. Extensive coronary artery calcification. 4. Moderate L1 compression deformity, likely remote in nature. 5. Aortic atherosclerosis. Aortic Atherosclerosis (ICD10-I70.0). Electronically Signed: By: Fidela Salisbury M.D. On: 02/25/2023 01:00   DG CHEST PORT 1 VIEW  Result Date: 02/25/2023 CLINICAL DATA:  Feeding tube placement.  Acute respiratory failure. EXAM: PORTABLE CHEST 1 VIEW, ABDOMEN PORTABLE ONE VIEW COMPARISON:  02/01/2023. FINDINGS: Heart is enlarged in the mediastinal contour is stable. The pulmonary vasculature is distended. Lung volumes are low with minimal atelectasis at the lung bases bilaterally. There is a small right pleural effusion. No pneumothorax. Sternotomy  wires are noted over the midline. The endotracheal tube terminates 4.9 cm above the carina. There is no evidence of bowel obstruction on limited view of the upper abdomen. The enteric tube terminates in the proximal stomach. IMPRESSION: 1. Cardiomegaly with mildly distended pulmonary vasculature. 2. Small right pleural effusion with atelectasis at the right lung base. 3. Support apparatus as detailed above. Electronically Signed   By: Brett Fairy M.D.   On: 02/25/2023 00:29   DG Abd Portable 1V  Result Date: 02/25/2023 CLINICAL DATA:  Feeding tube placement.   Acute respiratory failure. EXAM: PORTABLE CHEST 1 VIEW, ABDOMEN PORTABLE ONE VIEW COMPARISON:  02/07/2023. FINDINGS: Heart is enlarged in the mediastinal contour is stable. The pulmonary vasculature is distended. Lung volumes are low with minimal atelectasis at the lung bases bilaterally. There is a small right pleural effusion. No pneumothorax. Sternotomy wires are noted over the midline. The endotracheal tube terminates 4.9 cm above the carina. There is no evidence of bowel obstruction on limited view of the upper abdomen. The enteric tube terminates in the proximal stomach. IMPRESSION: 1. Cardiomegaly with mildly distended pulmonary vasculature. 2. Small right pleural effusion with atelectasis at the right lung base. 3. Support apparatus as detailed above. Electronically Signed   By: Brett Fairy M.D.   On: 02/25/2023 00:29   CT ANGIO HEAD NECK W WO CM W PERF (CODE STROKE)  Result Date: 01/31/2023 CLINICAL DATA:  Concern for left MCA or PCA stroke. Decline in speech and right-sided weakness. EXAM: CT ANGIOGRAPHY HEAD AND NECK CT PERFUSION BRAIN TECHNIQUE: Multidetector CT imaging of the head and neck was performed using the standard protocol during bolus administration of intravenous contrast. Multiplanar CT image reconstructions and MIPs were obtained to evaluate the vascular anatomy. Carotid stenosis measurements (when applicable) are obtained utilizing NASCET criteria, using the distal internal carotid diameter as the denominator. Multiphase CT imaging of the brain was performed following IV bolus contrast injection. Subsequent parametric perfusion maps were calculated using RAPID software. RADIATION DOSE REDUCTION: This exam was performed according to the departmental dose-optimization program which includes automated exposure control, adjustment of the mA and/or kV according to patient size and/or use of iterative reconstruction technique. CONTRAST:  118m OMNIPAQUE IOHEXOL 350 MG/ML SOLN COMPARISON:   Same-day CT head.  Carotid Doppler 03/19/2020. FINDINGS: CTA NECK FINDINGS Aortic arch: There is mild calcified plaque in the imaged aortic arch. The origins of the major branch vessels are patent. The subclavian arteries are patent to the level imaged. Right carotid system: The right common, internal, and external carotid arteries are patent with calcified plaque at the bifurcation resulting in less than 50% stenosis of the proximal internal carotid artery. The distal internal carotid artery is patent the external carotid artery is patent. There is no dissection or aneurysm. Left carotid system: The left common carotid artery is patent. There is bulky plaque at the bifurcation resulting in up to approximately 70% stenosis. The distal internal carotid artery is patent. The external carotid artery is patent. There is no evidence of dissection or aneurysm. Vertebral arteries: The vertebral arteries are patent, without hemodynamically significant stenosis or occlusion. There is no evidence of dissection or aneurysm Skeleton: There is no acute osseous abnormality or suspicious osseous lesion. There is no visible canal hematoma. Other neck: The soft tissues of the neck are unremarkable. Upper chest: The imaged lung apices are clear. Review of the MIP images confirms the above findings CTA HEAD FINDINGS Anterior circulation: There is extensive calcified plaque in the carotid  siphons resulting severe stenosis bilaterally. The left M1 segment is patent. There is short-segment severe stenosis of left M2 branches in the sylvian fissure proximally (8-117). There is no evidence of occlusion. There is moderate stenosis of the distal right M1 segment. There is focal moderate stenosis of a right M2 branch in the sylvian fissure (8-123). The distal branches are otherwise patent, without other proximal high-grade stenosis or occlusion. There is severe stenosis of the origin of the right A1 segment (6-162). Otherwise, the ACAs are  patent, without other proximal high-grade stenosis or occlusion. The anterior communicating artery is normal. There is no aneurysm or AVM. Posterior circulation: There is calcified plaque in the bilateral V4 segments resulting severe stenosis bilaterally. The PICA origins are not identified. The basilar artery is patent with moderate stenosis distally [id number]). There is multifocal short-segment occlusion of the left P1 and P2 segments (8-134, 141). There is intermittent diminutive reconstitution of flow in the distal branches. There is a fetal origin of the right PCA. There is severe stenosis of the proximal P2 segment (8-137). There is multifocal irregularity and narrowing of the distal branches without evidence of occlusion. There is no aneurysm or AVM. Venous sinuses: Patent. Anatomic variants: None. Review of the MIP images confirms the above findings CT Brain Perfusion Findings: ASPECTS: 10 on the head CT obtained 5:18 p.m. CBF (<30%) Volume: 43m Perfusion (Tmax>6.0s) volume: 228mMismatch Volume: 2078mnfarction Location:No infarct core is identified. A portion of the ischemic brain is identified in the left occipital lobe which would correspond with the PCA occlusion. IMPRESSION: 1. Multifocal short-segment occlusions of the left P1 and P2 segments with diminutive distal reconstitution. 2. 20 cc ischemic penumbra identified some of which is in the left occipital lobe which would correspond to the PCA occlusion. No core infarct identified. 3. Additional advanced intracranial atherosclerotic disease with multifocal severe stenoses in the anterior and posterior circulations as detailed above. 4. Bulky calcified plaque at the left carotid bifurcation resulting in approximately 70% stenosis of the proximal internal carotid artery. Mild plaque on the right without greater than 50% stenosis. Patent vertebral arteries in the neck. Findings discussed with Dr. KirLeonel Ramsay 5:58 p.m. Electronically Signed   By: PetValetta MoleD.   On: 02/23/2023 18:31   CT HEAD WO CONTRAST (5MM)  Result Date: 02/11/2023 CLINICAL DATA:  Acute stroke suspected, weakness, hyperglycemia EXAM: CT HEAD WITHOUT CONTRAST TECHNIQUE: Contiguous axial images were obtained from the base of the skull through the vertex without intravenous contrast. RADIATION DOSE REDUCTION: This exam was performed according to the departmental dose-optimization program which includes automated exposure control, adjustment of the mA and/or kV according to patient size and/or use of iterative reconstruction technique. COMPARISON:  03/18/2020 FINDINGS: Brain: No evidence of acute infarction, hemorrhage, hydrocephalus, extra-axial collection or mass lesion/mass effect. Unchanged subtle white matter hypodensity of the left corona radiata (series 2, image 18). Unchanged encephalomalacia of the cerebellar hemispheres. Vascular: No hyperdense vessel or unexpected calcification. Skull: Normal. Negative for fracture or focal lesion. Sinuses/Orbits: No acute finding. Other: None. IMPRESSION: 1. No acute intracranial pathology. 2. 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 Electronically Signed   By: AleDelanna AhmadiD.   On: 02/07/2023 17:37   DG Chest Portable 1 View  Result Date: 02/09/2023 CLINICAL DATA:  Shortness of breath. EXAM: PORTABLE CHEST 1 VIEW COMPARISON:  12/17/2022 FINDINGS: Previous median sternotomy and CABG procedure. Stable cardiac enlargement.  Lung volumes are low. Slight asymmetric elevation of the right hemidiaphragm with blunting of the right costophrenic angle. No interstitial edema or airspace disease. Visualized osseous structures appear intact. No displaced rib fractures identified. IMPRESSION: 1. Suspect small right pleural effusion. 2. Low lung volumes. 3. Stable cardiac enlargement. Electronically Signed   By: Kerby Moors M.D.   On:  02/08/2023 13:17    Cardiac Studies     Patient Profile     [name] is a 77 y.o. male with a hx of uncontrolled type 2 diabetes, atrial fibrillation on Eliquis, CAD s/p CABG (2017; LIMVA to LAD   SVG to PDA and OM1), CV dz, Atrial flutter CVA, DM, hypertension, hyperlipidemia, HFpEF, tobacco use restless leg syndrome  Admitted with DKA and then developed acute PCA CVA  Assessment & Plan    1  CAD  Hx CABG 2017   Elevated troponin earlier in admit and now again  Yesterday 3251    Most likely due to demand ischemia in setting of CAD, anemia.  Echo yesterday  I have reviewed images    OVerall normal function    Recomm  supportive care,  maximize O2 delivery ecASA 81 mg       2  Lipids   LDL 30   Pt not on statin  3  CKD   Cr 3.37   (wsa 1.34 in Dec 2023)     4   Anemia    HGb 5.1 on 2/28    Now 10  after transfusion  5  CV dz  s/p L vertebral junction angioplasty  Neuro following   Will sign of for now   Please call back if conditions change   For questions or updates, please contact [place] Please consult [email or web address] for contact info under        Signed, Dorris Carnes, MD  02/26/2023, 6:59 AM

## 2023-02-26 NOTE — Progress Notes (Signed)
Pharmacy Antibiotic Note  DRU OPPEGARD is a 77 y.o. male admitted on 02/24/2023 with  intra-abdominal infection .  Pharmacy has been consulted for vancomycin dosing.Gas seen on CT abdomen, concern for infection. WBC stable, afebrile. Patient received 1x dose of vancomycin on 2/28  at OSH. Patient with significant AKI, Scr: 3.37 baseline 1.3/1.4. Random level this AM 12.   Plan: Will give 1x dose of '2000mg'$  then dose based on levels given unstable renal fxn.  Consider getting additional levels as renal fxn progresses.  Follow culture data for de-escalation.   Weight: 119.7 kg (263 lb 14.3 oz)  Temp (24hrs), Avg:98.4 F (36.9 C), Min:97.6 F (36.4 C), Max:99.5 F (37.5 C)  Recent Labs  Lab 02/24/23 1143 02/24/23 1201 02/24/23 1421 02/24/23 1806 02/24/23 2022 02/24/23 2320 02/25/23 0213 02/25/23 0537 02/26/23 0433 02/26/23 0910  WBC 17.6*  --   --   --   --  15.8*  --  17.8* 12.4*  --   CREATININE 2.97*  --  2.96* 3.02* 2.70* 2.77*  --  2.66* 3.37*  --   LATICACIDVEN  --  2.7* 2.0*  --   --  1.8 1.2  --   --   --   VANCORANDOM  --   --   --   --   --   --   --   --   --  12    Estimated Creatinine Clearance: 24.1 mL/min (A) (by C-G formula based on SCr of 3.37 mg/dL (H)).    No Known Allergies  Thank you for allowing pharmacy to be a part of this patient's care.  Esmeralda Arthur, PharmD, BCCCP  02/26/2023 10:54 AM

## 2023-02-26 NOTE — Progress Notes (Signed)
eLink Physician-Brief Progress Note Patient Name: [name] DOB: [birth year] MRN: [id number]   Date of Service  02/26/2023  HPI/Events of Note  Serum triglyceride level is 666, patient is on Propofol gtt. No immediate plans for extubation as his oxygenation has been marginal, Precedex likely not an option for sedation at this time due to concern for ventilator dyssynchrony with exacerbation of hypoxemia.  eICU Interventions  Will discontinue Propofol and place patient on Versed gtt.        Kerry Kass Khiry Pasquariello 02/26/2023, 5:57 AM

## 2023-02-26 NOTE — Progress Notes (Signed)
PT Cancellation Note  Patient Details Name: [name] MRN: [id number] DOB: 09-03-1946   Cancelled Treatment:    Reason Eval/Treat Not Completed: Medical issues which prohibited therapy this morning. Per discussion with RN, pt remains heavily sedated and not appropriate for therapy evaluation at this time. Will continue to follow and evaluate as appropriate.   West Carbo, PT, DPT   Acute Rehabilitation Department Office [phone] Secure Chat Communication Preferred   [name] 02/26/2023, 11:55 AM

## 2023-02-26 NOTE — Progress Notes (Signed)
STROKE TEAM PROGRESS NOTE   INTERVAL HISTORY His daughter is at the bedside. RN at the bedside.  He remains intubated on Versed, fentanyl, Cleviprex and insulin gtt hematocrit is stable at 28.2. BP goal no change to 120-180.  Marland Kitchen  Neurological exam is limited by sedation.  Patient is too unstable to have MRI done. Vitals:   02/26/23 1000 02/26/23 1029 02/26/23 1030 02/26/23 1100  BP: (!) 101/55   (!) 100/55  Pulse: 76 75  73  Resp: '17 18  18  '$ Temp:      TempSrc:      SpO2: 94% 97% 96% 98%  Weight:       CBC:  Recent Labs  Lab 02/11/2023 1143 01/29/2023 2013 02/25/23 0537 02/25/23 1333 02/25/23 2330 02/26/23 0433  WBC 17.6*   < > 17.8*  --   --  12.4*  NEUTROABS 15.1*  --  15.4*  --   --   --   HGB 6.6*   < > 7.0*   < > 9.9* 10.0*  HCT 20.4*   < > 19.8*   < > 28.2* 28.9*  MCV 86.4   < > 87.2  --   --  88.1  PLT 171   < > 171  --   --  154   < > = values in this interval not displayed.   Basic Metabolic Panel:  Recent Labs  Lab 02/25/23 0537 02/25/23 1636 02/26/23 0433  NA 134*  --  132*  K 4.4  --  5.0  CL 103  --  102  CO2 19*  --  19*  GLUCOSE 188*  --  200*  BUN 58*  --  62*  CREATININE 2.66*  --  3.37*  CALCIUM 7.5*  --  7.5*  MG  --  2.9* 3.0*  PHOS  --  5.3* 6.0*   Lipid Panel:  Recent Labs  Lab 02/25/23 0537 02/26/23 0433  CHOL 126  --   TRIG 337* 666*  HDL 29*  --   CHOLHDL 4.3  --   VLDL 67*  --   LDLCALC 30  --    HgbA1c:  Recent Labs  Lab 02/25/23 0537  HGBA1C 8.6*   Urine Drug Screen:  Recent Labs  Lab 02/11/2023 1806  LABOPIA NONE DETECTED  COCAINSCRNUR NONE DETECTED  LABBENZ NONE DETECTED  AMPHETMU NONE DETECTED  THCU NONE DETECTED  LABBARB NONE DETECTED    Alcohol Level  Recent Labs  Lab 02/04/2023 1806  ETH <10    IMAGING past 24 hours No results found.  PHYSICAL EXAM  Temp:  [98.1 F (36.7 C)-99.5 F (37.5 C)] 98.1 F (36.7 C) (03/01 0800) Pulse Rate:  [71-91] 73 (03/01 1100) Resp:  [12-23] 18 (03/01 1100) BP:  (96-144)/(47-60) 100/55 (03/01 1100) SpO2:  [74 %-100 %] 98 % (03/01 1100) Arterial Line BP: (115-154)/(39-143) 139/48 (03/01 1100) FiO2 (%):  [60 %] 60 % (03/01 1030) Weight:  [119.7 kg] 119.7 kg (03/01 0702)  General - critically ill male, intubated in NAD  Cardiovascular - irregularly irregular   Mental Status -  Patient is intubated and sedated on Prop. He is not following commands, eyes are closed.  PERRL, eyes midline, EOMI, bilateral corneal reflexes.   Cranial Nerves II - XII - II - Visual field intact OU. III, IV, VI - Extraocular movements intact. V - Facial sensation intact bilaterally. VII - unable to assess  VIII - Hearing & vestibular intact bilaterally.  Motor Strength - Left hemibody  is purposeful and antigravity., responds to noxious stimuli on right side with semipurposeful movements Motor Tone - Muscle tone was assessed at the neck and appendages and was normal  Sensory - responds to noxious stimuli bilaterally   Coordination - Unable to assess   Gait and Station - deferred.  ASSESSMENT/PLAN [name] is a 77 y.o. male with history of uncontrolled type 2 diabetes, atrial fibrillation on Eliquis, CAD s/p CABG, CVA, hypertension, hyperlipidemia, HFpEF, restless leg syndrome who presents to the ED 2/2 weakness and hyperglycemia was found to be aphasic  Stroke:  Acute left PCA ischemic infarct s/p balloon angioplasty of left vertebral junction, no stent or thrombectomy Etiology: Diffuse multivessel severe intracranial stenosis  CT head No acute abnormality.  CTA head & neck w Perf Multifocal short-segment occlusions of the left P1 and P2 segments with diminutive distal reconstitution.  20 cc ischemic penumbra identified some of which is in the left occipital lobe  Cerebral angio Severe preocclusive stenosis of the left vertebral basilar junction at the origin of the left posterior inferior cerebellar artery. High-grade stenosis of the left posterior  cerebral artery P2 segment, in the P3 segment. Status post endovascular revascularization of high-grade stenosis of the left vertebral junction with balloon angioplasty to a patency of 70  Post IR CT  no hemorrhage Repeat CT head No acute intracranial abnormality.  MRI  ordered 2D Echo EF 50-55%. LV Grade II diastolic dysfunction. mild concentric hypertrophy  LDL 30 HgbA1c 11.0 VTE prophylaxis - SCD's    Diet   Diet NPO time specified   Eliquis  prior to admission, now on aspirin 81 mg daily. Currently receiving blood. Will restart when/if appropriate  Therapy recommendations:  pending Disposition:  pending  Hypertension Hypertensive Urgency  Home meds:  norvasc, losartan, metoprolol,  UnStable On Cleviprex gtt @ '14mg'$   Home meds on hold. Restart as appropriate  SBP 120-140 for 24 hrs s/p thrombectomy  Long-term BP goal normotensive  Hyperlipidemia Home meds:  zocor '80mg'$ , notresumed in hospital LDL 30, goal < 70 Continue statin at discharge  CAD s/p CABG x 3 (2017) Acute on chronic diastolic heart failure Paroxysmal atrial fibrillation Eliquis on hold due to RP bleed Cardiology following  Continue ASA   Diabetes type II UnControlled DKA Home meds:  insulin, metformin, semaglutide HgbA1c 11.0, goal < 7.0 Currently on insulin gtt- managed by CCM  CBGs Recent Labs    02/26/23 0939 02/26/23 1035 02/26/23 1126  GLUCAP 206* 218* 151*    SSI Will need close outpatient follow  Will need DM education when appropriate   Acute hypoxic respiratory failure  Intubated - management by CCM On prop gtt and fentanyl PRN CXR as needed   Dysphagia  OGT placed.  Start TF when appropriate  Bedside swallow when extubated  ST consult   Other Stroke Risk Factors Advanced Age >/= 42  Obesity, Body mass index is 35.79 kg/m., BMI >/= 30 associated with increased stroke risk, recommend weight loss, diet and exercise as appropriate  Hx stroke/TIA Coronary artery  disease Congestive heart failure  Other Active Problems AKI- Cr 2.70->2.66 Hyponatremia Na 134 Acute blood loss- RP bleed, ? GI bleed - Hgb 7.0- transfused blood 2/29 Leukocytosis WBC 17.8   Hospital day # 2  Patient with A-fib on Eliquis presented with altered mental status in the setting of diabetic ketoacidosis and anemia likely from retroperitoneum hematoma from a fall several days ago while on Eliquis.  He subsequently developed aphasia and was found  to have severe left posterior cerebral artery occlusion and during attempts for thrombectomy was found to have preocclusive severe terminal left vertebral artery stenosis which required emergent angioplasty.  He is critically ill and due to his severe anemia requiring blood transfusion not a candidate for antiplatelet therapy or more aggressive intervention at this time.  Hold aspirin and Eliquis for now.  Continue close neurological observation and strict blood pressure control with systolic goal [id number]  and wean off sedation as tolerated.  Check MRI scan of the brain later when patient is stable enough for transport.  Long discussion with his daughter at the bedside and answered questions about his prognosis and plan of care. .  Long discussion with patient and Dr. Valeta Harms critical care medicine and answered questions. This patient is critically ill and at significant risk of neurological worsening, death and care requires constant monitoring of vital signs, hemodynamics,respiratory and cardiac monitoring, extensive review of multiple databases, frequent neurological assessment, discussion with family, other specialists and medical decision making of high complexity.I have made any additions or clarifications directly to the above note.This critical care time does not reflect procedure time, or teaching time or supervisory time of PA/NP/Med Resident etc but could involve care discussion time.  I spent 30 minutes of neurocritical care time  in the care  of  this patient.         Antony Contras, MD Medical Director [place] Pager: [phone] 02/26/2023 12:20 PM  To contact Stroke Continuity provider, please refer to [email or web address] After hours, contact General Neurology

## 2023-02-26 NOTE — Progress Notes (Signed)
NAME:  [name], MRN:  [id number], DOB:  1946/02/08, LOS: 2 ADMISSION DATE:  01/28/2023, CONSULTATION DATE:  01/28/2023 REFERRING MD: Lorrin Goodell - Neuro CHIEF COMPLAINT: Acute stroke s/p thrombectomy  History of Present Illness:   77 year old man who initially presented to [facility] 2/28 for 2-3 days of weakness. S/p fall at home 2 days PTA. Transferred to [facility] 2/28PM for mechanical thrombectomy. Also with hyperkalemia in the setting of AKI and DKA. PMHx significant for HTN, HLD, CAD (s/p CABG x 3 2017), HFpEF (Echo 01/2023 with EF 50-55%), AF (on Eliquis), CVA (R pontine stroke, cerebellar stroke), T2DM, BPH, depression.  At [facility], patient was noted to have difficulty answering questions and word-finding. Patient's son reported a fall at home 2-3 days PTA that has limited patient's mobility due to pain; he felt there was a possibility patient had not been taking his medications. At baseline, patient lives alone and is independent with all ADLs. Labs at [facility] were notable for pH 7.32, pCO2 33, CBG 787. WBC 17.6, Hgb 6.6. Na 122, K 6.3, bicarb 16 (AG 14), BUN/Cr 70/2.97. BNP 685, Trop 2280. B-hydroxybutyrate 3.77, LA 2.7. UA +glucose, ketones, protein. C/f DKA and NSTEMI, insulin gtt started and Cards consulted with recommendation to defer [facility] in the setting of Hgb drift, ?bleed. Code Stroke was called for AMS and CT Head NAICA, consistent with prior infarcts. CTA Head/Neck showed L PCA (P1 P2) occlusions, L carotid plaque (70% stenosis). Decision for transfer to [facility] for emergent mechanical thrombectomy.  Underwent endovascular revascularization of high-grade L vertebral junction with balloon angioplasty (patency 70%) with NIR (Deveshwar) 2/28PM; post-procedure CT without ICH. Repeat CT Head completed 2/29 as patient was slow to wake from sedation, NAICA/stable. CT A/P was completed 2/29AM in the setting of Hgb drift with unknown cause, demonstrating moderate R renal subcapsular hematoma with extension  of hemorrhage into the posterior pararenal space and rib fractures (R 11th and 12th ribs); suspect this is related to recent fall at home PTA.  Remains critically in in 4N Neuro ICU.  Pertinent Medical History:   Past Medical History:  Diagnosis Date   Atrial fibrillation ([place])    a.) CHA2DS2VASc = 6 (age x 2,  HTN, CVA/TIA x 2, T2DM);  b.) s/p DCCV 09/25/2020 (120 J x1); c.) rate/rhythm maintained on oral metoprolol tartrate; chronically anticoagulated with apixaban   BPH (benign prostatic hyperplasia)    CAD (coronary artery disease) 10/28/2016   a.) LHC 10/28/2016: 95% dLAD, 90% p-mLCx, 75% mLAD, 60% m-dLAD, 99% p-mRCA, 75% dRCA --> consult CVTS; b.) 3v CABG at [facility] 11/05/2016   Carotid artery disease ([place]) 06/29/2016   a.) carotid US 06/29/2016: <50% BICA   Cerebellar stroke ([place]) 11/13/2015   a.) CT imaging 11/13/2015 revealed old/remote LEFT cerebellar and LEFT carona radiata infarcts   CHF (congestive heart failure) (HCC)    Depression    Diabetic foot ulcers (HCC)    Diastolic dysfunction [id number]   a.) TTE 06/28/2016: EF 55-60%, G1DD; b.) TTE 03/19/2020: EF 55-60%, mild BAE, mild-mod MR/TR.   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 Found to be in DKA with c/f recurrent CVA (L PCA?). Also with NSTEMI, elevated trop. Taken to [facility] for ?thrombectomy, underwent endovascular revasc/balloon angioplasty of L vertebral/PCA, no stent. 2/29 - Slow to wake up post-sedation wean, repeat CT Head NAICA. Hgb 6.6, CT A/P with renal subcapsular hematoma/extension into posterior RP space, rib fx on R (11/12). Hgb 7.0, 2U PRBCs ordered. Remains intubated, sedated on insulin gtt. TF started. 3/01 - Sedation adjusted overnight to Versed/Fentanyl in the setting of elevated TG (Prop discontinued). Versed discontinued and Precedex initiated. Cleviprex transitioned to Cardene. Insulin gtt weaned off to SQ insulin. Hgb stable 10.0. Cr worse, US Renal with doppler pending. MRI/MRA Brain pending.  Interim History / Subjective:  No significant events overnight Hgb remains stable, 10.0 s/p 2U PRBCs TG elevated, transitioned off of Prop/Cleviprex Versed started overnight, transitioned to Precedex to allow for better neuro exam Cleviprex to Cardene, liberalized SBP goal to < 180 per stroke Insulin gtt weaning to SQ Cr continues to rise with oliguria US Renal + Doppler  Objective:  Blood pressure (!) 106/49, pulse 78, temperature 98.5 F (36.9 C), temperature source Axillary, resp. rate 19, weight 119.7 kg, SpO2 96 %.    Vent Mode: PRVC FiO2 (%):  [60 %-100 %] 60 % Set Rate:  [18 bmp] 18 bmp Vt Set:  [620 mL] 620 mL PEEP:  [12 cmH20-14 cmH20] 12 cmH20 Plateau Pressure:  [20 cmH20-25 cmH20] 25 cmH20   Intake/Output Summary (Last 24 hours) at 02/26/2023 0729 Last data filed at 02/26/2023 0700 Gross per 24 hour  Intake 5392.91 ml  Output 685 ml  Net 4707.91 ml    Filed Weights   01/28/2023 2300 02/25/23 0530 02/26/23 0702  Weight: 110.5 kg 113.7 kg 119.7 kg   Physical Examination: General: Acutely ill-appearing elderly man in NAD. Intubated, sedated. HEENT: [place]/AT, anicteric sclera, PERRL 29m, moist mucous membranes. ETT/OGT in place. Neuro:  Sedated. Does not respond to verbal or tactile stimuli, some facial grimacing to noxious stimuli. Flicker to pain in all 4 extremities. Not following commands. No spontaneous movement of extremities witnessed on exam. +Corneal, +Cough, and +Gag  CV: RRR, no m/g/r. PULM: Breathing even and unlabored on vent (PEEP 10, FiO2 60%). Lung fields CTAB in upper fields, diminished at bases R > L. GI: Obese, soft, nontender, mild-moderate distention. Hypoactive bowel sounds. Extremities: Bilateral symmetric 1+ LE edema noted. Amputations of toes of R foot (great toe + 2-4th digit), 5th digit intact with mild nail overgrowth/ulceration. Skin: Warm/dry, no rashes.  Resolved Hospital Problem List:     Assessment & Plan:  Acute PCA stroke s/p endovascular revascularization/balloon angioplasty, no stent - Neuro/Stroke following, appreciate recommendations - NIR following post-procedure - Goal SBP liberalized to < 180 now >24H post - Cleviprex/Cardene weaned off - MRI Brain when feasible - Wean sedation as able for neuro exam post-MRI - Frequent neuro checks - Neuroprotective measures: HOB > 30 degrees, normoglycemia, normothermia, electrolytes WNL - PT/OT/SLP when able to participate in care  Acute hypoxemic respiratory failure, need for mechanical ventilation in the setting of AMS - Continue full vent support (4-8cc/kg  IBW) - Wean FiO2 for O2 sat > 90% - Daily WUA/SBT once appropriate from a mental status standpoint - VAP bundle - Pulmonary hygiene - PAD protocol for sedation: Precedex and Fentanyl for goal RASS -1 to -2 - Follow CXR  DKA T2DM - AG closed - Insulin gtt weaned off 3/1, transition to SQ insulin - Semglee 50U daily (home dose 100U) - TF coverage 8U Q4H - SSI - CBGs Q4H, goal 140-180  Elevated troponin CAD s/p CABG x 3 (2017) Acute on chronic diastolic heart failure Paroxysmal atrial fibrillation - Cardiology consulted, appreciate recommendations - Troponin elevation likely  demand-related, cannot r/o ACS given cardiac history - Echo with EF 50-55%, +RWMAs, mid concentric LVH - Continue ASA, statin - Hold Eliquis in the setting of subcapsular hematoma/RP bleed, will need to revisit AC once more stable - Cardiac monitoring - Optimize lytes as able (K > 4, Mg > 2)  Oliguric AKI in setting of stage II CKD, also with subcapsular hematoma, ?Page kidney physiology Hyperkalemia, improved Some concern for Page kidney physiology, given known R subcapsular hematoma and initially difficult-to-control hypertension despite Cleviprex. - Trend BMP, Cr uptrending - Replete electrolytes as indicated - Monitor I&Os - Avoid nephrotoxic agents as able - Ensure adequate renal perfusion - US Renal + doppler to assess flow into kidney - D/w Nephro, recommend Albumin 25g x 3 and Lasix challenge - D/w IR, even if Page kidney - unable to intervene due to typical viscosity of blood products, would need surgical intervention  Leukocytosis, likely reactive in the setting of multiple insults ?Punctate foci of gas near bleed - Trend WBC, fever curve - Continue ceftriaxone, Flagyl, vanc (MRSA screen +) - Continue to monitor  ABLA in the setting of subcapsular hematoma/RP bleed S/p 3 units PRBC at [facility], 2 additional 2/29. - Trend H&H - Monitor for signs of active bleeding - Transfuse for Hgb < 8.0 (given cardiac history) or hemodynamically significant bleeding - Eliquis on hold - If H&H drifting, low threshold to repeat abdominal imaging  Right 11th and 12th rib fractures - Supportive care/pain control  Best Practice: (right click and "Reselect all SmartList Selections" daily)   Diet/type: NPO DVT prophylaxis: SCD GI prophylaxis: PPI Lines: N/A Foley:  N/A Code Status:  full code Last date of multidisciplinary goals of care discussion [3/1 - D/w daughter, Arrie Aran, at bedside - remains full code at present.]  Critical care time:    The patient is critically ill with multiple  organ system failure and requires high complexity decision making for assessment and support, frequent evaluation and titration of therapies, advanced monitoring, review of radiographic studies and interpretation of complex data.   Critical Care Time devoted to patient care services, exclusive of separately billable procedures, described in this note is 38 minutes.  Lestine Mount, PA-C [facility] 02/26/23 7:29 AM  Please see [email or web address] for pager details.  From 7A-7P if no response, please call [phone] After hours, please call ELink [phone]

## 2023-02-26 NOTE — Progress Notes (Signed)
OT Cancellation Note  Patient Details Name: [name] MRN: [id number] DOB: [birth year]   Cancelled Treatment:    Reason Eval/Treat Not Completed: Patient not medically ready (Sedated on vent. Will follow up later time)  [facility] 02/26/2023, 10:19 AM Maurie Boettcher, OT/L   Acute OT Clinical Specialist Acute Rehabilitation Services Pager [phone] Office [phone]

## 2023-02-26 NOTE — Progress Notes (Addendum)
Supervising Physician: Luanne Bras  Patient Status:  [facility] - In-pt  Chief Complaint:  Pt seen 2 days s/p cerebral angiogram with revasc/angioplasty at left dominant vertebrobasilar junction  Subjective:  Pt remains sedated and on vent Daughter is bedside RN reports unable to get MRI yet due to so much sedation/drip needs  Allergies: Patient has no known allergies.  Medications: Prior to Admission medications   Medication Sig Start Date End Date Taking? Authorizing Provider  acetaminophen (TYLENOL) 325 MG tablet Take 2 tablets (650 mg total) by mouth every 6 (six) hours as needed for mild pain, fever or headache (or Fever >/= 101). 11/28/22   Lorella Nimrod, MD  amLODipine (NORVASC) 10 MG tablet Take 1 tablet (10 mg total) by mouth daily. 11/28/22   Lorella Nimrod, MD  apixaban (ELIQUIS) 5 MG TABS tablet Take 1 tablet (5 mg total) by mouth 2 (two) times daily. 03/20/20   Lavina Hamman, MD  calcium carbonate (TUMS - DOSED IN MG ELEMENTAL CALCIUM) 500 MG chewable tablet Chew 1 tablet by mouth daily as needed for indigestion or heartburn.    [provider]  cholecalciferol (VITAMIN D) 1000 UNITS tablet Take 1,000 Units by mouth daily.    [provider]  clopidogrel (PLAVIX) 75 MG tablet Take 75 mg by mouth daily.    [provider]  docusate sodium (COLACE) 100 MG capsule Take 100 mg by mouth 2 (two) times daily.    [provider]  finasteride (PROSCAR) 5 MG tablet Take 5 mg by mouth daily. Patient not taking: Reported on 01/28/2023    [provider]  furosemide (LASIX) 20 MG tablet Take 20 mg by mouth 2 (two) times daily.    [provider]  gabapentin (NEURONTIN) 100 MG capsule Take 100 mg by mouth 3 (three) times daily.    [provider]  icosapent Ethyl (VASCEPA) 1 g capsule Take 2 g by mouth 2 (two) times daily.     [provider]  insulin aspart (NOVOLOG) 100 UNIT/ML injection Inject 20 Units into  the skin 3 (three) times daily before meals. Patient taking differently: Inject 30-35 Units into the skin 3 (three) times daily before meals. 30 units before breakfast and lunch, 35 units before dinner 03/20/20   Lavina Hamman, MD  insulin glargine (LANTUS SOLOSTAR) 100 UNIT/ML Solostar Pen Inject 100 Units into the skin at bedtime. 12/30/22   [provider]  losartan (COZAAR) 50 MG tablet Take 1 tablet (50 mg total) by mouth daily. 11/28/22   Lorella Nimrod, MD  metFORMIN (GLUCOPHAGE) 1000 MG tablet Take 1,000 mg by mouth 2 (two) times daily with a meal.     [provider]  metoprolol tartrate (LOPRESSOR) 25 MG tablet Take 12.5 mg by mouth 2 (two) times daily.    [provider]  nortriptyline (PAMELOR) 10 MG capsule Take 30 mg by mouth at bedtime.     [provider]  PARoxetine (PAXIL) 40 MG tablet Take 20 mg by mouth daily.    [provider]  polyethylene glycol (MIRALAX / GLYCOLAX) 17 g packet Take 17 g by mouth daily as needed for mild constipation. 11/28/22   Lorella Nimrod, MD  rOPINIRole (REQUIP) 0.5 MG tablet Take 0.5 mg by mouth at bedtime.    [provider]  Semaglutide, 2 MG/DOSE, 8 MG/3ML SOPN Inject 2 mg into the skin every 7 (seven) days. 10/27/22   [provider]  simvastatin (ZOCOR) 80 MG tablet Take 40  mg by mouth daily at 6 PM. 02/01/23   [provider]  tamsulosin (FLOMAX) 0.4 MG CAPS capsule Take 0.4 mg by mouth daily after lunch.     [provider]  vitamin B-12 (CYANOCOBALAMIN) 1000 MCG tablet Take 1,000 mcg by mouth at bedtime.    [provider]     Vital Signs: BP (!) 107/51 (BP Location: Right Arm)   Pulse 80   Temp 98.5 F (36.9 C) (Axillary)   Resp 18   Wt 263 lb 14.3 oz (119.7 kg)   SpO2 97%   BMI 35.79 kg/m   Physical Exam Constitutional:      Interventions: He is sedated and intubated.     Comments: OG tube in place Mittens in place  Eyes:     Comments: Pupils  sluggish, but equal  Cardiovascular:     Comments: Right CFA soft and not bleeding.  No pseudoaneurysm Pulmonary:     Effort: He is intubated.     Imaging: ECHOCARDIOGRAM COMPLETE  Result Date: 02/25/2023    ECHOCARDIOGRAM REPORT   Patient Name:   [name] Date of Exam: 02/25/2023 Medical Rec #:  [id number]             Height:       72.0 in Accession #:    [id number]            Weight:       250.7 lb Date of Birth:  1946-05-20             BSA:          2.345 m Patient Age:    77 years              BP:           107/53 mmHg Patient Gender: M                     HR:           73 bpm. Exam Location:  Inpatient Procedure: 2D Echo, Cardiac Doppler and Color Doppler Indications:    Stroke  History:        Patient has prior history of Echocardiogram examinations. CAD,                 Prior CABG, Arrythmias:Atrial Fibrillation; Risk                 Factors:Hypertension and Diabetes.  Sonographer:    Danne Baxter RDCS, FE, PE Referring Phys: [id number] [place]  1. Left ventricular ejection fraction, by estimation, is 50 to 55%. The left ventricle has low normal function. The left ventricle demonstrates regional wall motion abnormalities (see scoring diagram/findings for description). There is mild concentric left ventricular hypertrophy. Left ventricular diastolic parameters are consistent with Grade II diastolic dysfunction (pseudonormalization).  2. Right ventricular systolic function is normal. The right ventricular size is normal.  3. The mitral valve is normal in structure. No evidence of mitral valve regurgitation. No evidence of mitral stenosis.  4. The aortic valve is normal in structure. Aortic valve regurgitation is not visualized. Aortic valve sclerosis is present, with no evidence of aortic valve stenosis.  5. There is borderline dilatation of the ascending aorta, measuring 36 mm.  6. The inferior vena cava is normal in size with greater than 50% respiratory variability,  suggesting right atrial pressure of 3 mmHg. FINDINGS  Left Ventricle: Left ventricular ejection fraction, by estimation, is 50 to 55%. The  left ventricle has low normal function. The left ventricle demonstrates regional wall motion abnormalities. The left ventricular internal cavity size was normal in size. There is mild concentric left ventricular hypertrophy. Left ventricular diastolic parameters are consistent with Grade II diastolic dysfunction (pseudonormalization).  LV Wall Scoring: The basal inferolateral segment and mid inferior segment are akinetic. The entire anterior wall, antero-lateral wall, mid and distal lateral wall, entire septum, entire apex, and basal inferior segment are hypokinetic. Right Ventricle: The right ventricular size is normal. No increase in right ventricular wall thickness. Right ventricular systolic function is normal. Left Atrium: Left atrial size was normal in size. Right Atrium: Right atrial size was normal in size. Pericardium: There is no evidence of pericardial effusion. Presence of epicardial fat layer. Mitral Valve: The mitral valve is normal in structure. No evidence of mitral valve regurgitation. No evidence of mitral valve stenosis. Tricuspid Valve: The tricuspid valve is normal in structure. Tricuspid valve regurgitation is mild . No evidence of tricuspid stenosis. Aortic Valve: The aortic valve is normal in structure. Aortic valve regurgitation is not visualized. Aortic valve sclerosis is present, with no evidence of aortic valve stenosis. Pulmonic Valve: The pulmonic valve was normal in structure. Pulmonic valve regurgitation is not visualized. No evidence of pulmonic stenosis. Aorta: The aortic root is normal in size and structure. There is borderline dilatation of the ascending aorta, measuring 36 mm. Venous: The inferior vena cava is normal in size with greater than 50% respiratory variability, suggesting right atrial pressure of 3 mmHg. IAS/Shunts: No atrial level  shunt detected by color flow Doppler.  LEFT VENTRICLE PLAX 2D LVIDd:         5.20 cm   Diastology LVIDs:         3.50 cm   LV e' medial:    8.08 cm/s LV PW:         1.00 cm   LV E/e' medial:  14.2 LV IVS:        1.20 cm   LV e' lateral:   9.48 cm/s LVOT diam:     2.10 cm   LV E/e' lateral: 12.1 LV SV:         84 LV SV Index:   36 LVOT Area:     3.46 cm  RIGHT VENTRICLE RV S prime:     9.17 cm/s TAPSE (M-mode): 1.3 cm LEFT ATRIUM             Index        RIGHT ATRIUM           Index LA diam:        3.70 cm 1.58 cm/m   RA Area:     19.40 cm LA Vol (A2C):   42.7 ml 18.21 ml/m  RA Volume:   51.60 ml  22.00 ml/m LA Vol (A4C):   70.1 ml 29.89 ml/m LA Biplane Vol: 56.0 ml 23.88 ml/m  AORTIC VALVE LVOT Vmax:   122.00 cm/s LVOT Vmean:  80.800 cm/s LVOT VTI:    0.243 m  AORTA Ao Root diam: 3.30 cm Ao Asc diam:  3.60 cm MITRAL VALVE                TRICUSPID VALVE MV Area (PHT): 3.13 cm     TR Peak grad:   12.0 mmHg MV Decel Time: 242 msec     TR Vmax:        173.00 cm/s MV E velocity: 115.00 cm/s MV A velocity: 90.30 cm/s  SHUNTS MV E/A ratio:  1.27         Systemic VTI:  0.24 m                             Systemic Diam: 2.10 cm Godfrey Pick Tobb DO Electronically signed by Berniece Salines DO Signature Date/Time: 02/25/2023/10:21:33 AM    Final    DG Chest Port 1 View  Result Date: 02/25/2023 CLINICAL DATA:  Acute respiratory failure EXAM: PORTABLE CHEST 1 VIEW COMPARISON:  02/20/2023 FINDINGS: Endotracheal tube with tip at the clavicular heads. An enteric tube reaches the stomach. Artifact from EKG leads. Elevated right diaphragm with hazy infiltrate at the right base. No edema, effusion, or pneumothorax. Cardiomegaly.  Prior CABG. IMPRESSION: Stable hardware positioning and volume loss at the right lung base. Electronically Signed   By: Jorje Guild M.D.   On: 02/25/2023 05:27   CT HEAD WO CONTRAST (5MM)  Result Date: 02/25/2023 CLINICAL DATA:  Initial evaluation for acute mental status change. EXAM: CT HEAD WITHOUT  CONTRAST TECHNIQUE: Contiguous axial images were obtained from the base of the skull through the vertex without intravenous contrast. RADIATION DOSE REDUCTION: This exam was performed according to the departmental dose-optimization program which includes automated exposure control, adjustment of the mA and/or kV according to patient size and/or use of iterative reconstruction technique. COMPARISON:  Prior studies from 02/18/2023. FINDINGS: Brain: Mild age-related cerebral atrophy. Remote ischemic change at the left basal ganglia/corona radiata. 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 No acute intracranial hemorrhage. No visible acute large vessel territory infarct. No mass lesion or midline shift. No hydrocephalus or extra-axial fluid collection. Vascular: Contrast material remains on board from recent procedures. Scattered calcified atherosclerosis present about the skull base. Skull: Scalp soft tissues and calvarium demonstrate no new finding. Sinuses/Orbits: Globes orbital soft tissues within normal limits. Scattered mucosal thickening present throughout the paranasal sinuses. Patient is intubated. Mastoid air cells remain clear. Other: None. IMPRESSION: 1. No acute intracranial abnormality. 2. Chronic ischemic changes involving the left basal ganglia/corona radiata and left greater than right cerebellum, stable. Electronically Signed   By: Jeannine Boga M.D.   On: 02/25/2023 02:30   CT ABDOMEN PELVIS WO CONTRAST  Addendum Date: 02/25/2023   ADDENDUM REPORT: 02/25/2023 01:09 ADDENDUM: These results were called by telephone at the time of interpretation on 02/25/2023 at 1:09 am to provider [facility] , who verbally acknowledged these results. Electronically Signed   By: Fidela Salisbury M.D.   On: 02/25/2023 01:09   Result Date: 02/25/2023 CLINICAL DATA:  Retroperitoneal hemorrhage EXAM: CT ABDOMEN AND PELVIS WITHOUT CONTRAST TECHNIQUE: Multidetector CT imaging of the abdomen and pelvis  was performed following the standard protocol without IV contrast. RADIATION DOSE REDUCTION: This exam was performed according to the departmental dose-optimization program which includes automated exposure control, adjustment of the mA and/or kV according to patient size and/or use of iterative reconstruction technique. COMPARISON:  None Available. FINDINGS: Lower chest: Bibasilar atelectasis. Extensive coronary artery calcification. Nasogastric tube tip extends into the proximal body of the stomach. Hepatobiliary: No focal liver abnormality is seen. No gallstones, gallbladder wall thickening, or biliary dilatation. Pancreas: Unremarkable Spleen: Unremarkable Adrenals/Urinary Tract: The adrenal glands are unremarkable. The kidneys are normal in position. Contrast enhancement of the renal cortices and contrast within the renal collecting system is related to recent CT arteriography and endovascular procedure. A moderate right subcapsular hematoma is seen involving the mid lower pole the right kidney with  moderate extension of hemorrhage into the posterior pararenal space. There are, additionally, punctate foci of gas along the posteroinferior aspect of the subcapsular hematoma suggesting possible intervention, as can be seen with recent renal biopsy, or, less likely, superimposed infection. The left kidney is unremarkable. The bladder is decompressed and is unremarkable. Stomach/Bowel: Stomach is within normal limits. Appendix appears normal. No evidence of bowel wall thickening, distention, or inflammatory changes. Vascular/Lymphatic: Aortic atherosclerosis. No enlarged abdominal or pelvic lymph nodes. Reproductive: Prostate is unremarkable. Other: Mild asymmetric subcutaneous edema within the right flank may relate to trauma or dependent positioning. No abdominal wall hernia. Musculoskeletal: Acute fractures of the right eleventh and twelfth ribs are identified posteriorly. Moderate L1 compression deformity with  40-50% loss of height is seen likely remote in nature. No associated retropulsion. Degenerative changes are seen within the visualized thoracolumbar spine. IMPRESSION: 1. Moderate right renal subcapsular hematoma with moderate extension of hemorrhage into the posterior pararenal space. Punctate foci of gas along the posteroinferior aspect of the subcapsular hematoma suggesting possible intervention, as can be seen with recent renal biopsy, or, less likely, superimposed infection. Correlation for history of recent biopsy is recommended. 2. Acute fractures of the right eleventh and twelfth ribs posteriorly. 3. Extensive coronary artery calcification. 4. Moderate L1 compression deformity, likely remote in nature. 5. Aortic atherosclerosis. Aortic Atherosclerosis (ICD10-I70.0). Electronically Signed: By: Fidela Salisbury M.D. On: 02/25/2023 01:00   DG CHEST PORT 1 VIEW  Result Date: 02/25/2023 CLINICAL DATA:  Feeding tube placement.  Acute respiratory failure. EXAM: PORTABLE CHEST 1 VIEW, ABDOMEN PORTABLE ONE VIEW COMPARISON:  02/06/2023. FINDINGS: Heart is enlarged in the mediastinal contour is stable. The pulmonary vasculature is distended. Lung volumes are low with minimal atelectasis at the lung bases bilaterally. There is a small right pleural effusion. No pneumothorax. Sternotomy wires are noted over the midline. The endotracheal tube terminates 4.9 cm above the carina. There is no evidence of bowel obstruction on limited view of the upper abdomen. The enteric tube terminates in the proximal stomach. IMPRESSION: 1. Cardiomegaly with mildly distended pulmonary vasculature. 2. Small right pleural effusion with atelectasis at the right lung base. 3. Support apparatus as detailed above. Electronically Signed   By: Brett Fairy M.D.   On: 02/25/2023 00:29   DG Abd Portable 1V  Result Date: 02/25/2023 CLINICAL DATA:  Feeding tube placement.  Acute respiratory failure. EXAM: PORTABLE CHEST 1 VIEW, ABDOMEN PORTABLE  ONE VIEW COMPARISON:  02/07/2023. FINDINGS: Heart is enlarged in the mediastinal contour is stable. The pulmonary vasculature is distended. Lung volumes are low with minimal atelectasis at the lung bases bilaterally. There is a small right pleural effusion. No pneumothorax. Sternotomy wires are noted over the midline. The endotracheal tube terminates 4.9 cm above the carina. There is no evidence of bowel obstruction on limited view of the upper abdomen. The enteric tube terminates in the proximal stomach. IMPRESSION: 1. Cardiomegaly with mildly distended pulmonary vasculature. 2. Small right pleural effusion with atelectasis at the right lung base. 3. Support apparatus as detailed above. Electronically Signed   By: Brett Fairy M.D.   On: 02/25/2023 00:29   CT ANGIO HEAD NECK W WO CM W PERF (CODE STROKE)  Result Date: 02/15/2023 CLINICAL DATA:  Concern for left MCA or PCA stroke. Decline in speech and right-sided weakness. EXAM: CT ANGIOGRAPHY HEAD AND NECK CT PERFUSION BRAIN TECHNIQUE: Multidetector CT imaging of the head and neck was performed using the standard protocol during bolus administration of intravenous contrast. Multiplanar CT image  reconstructions and MIPs were obtained to evaluate the vascular anatomy. Carotid stenosis measurements (when applicable) are obtained utilizing NASCET criteria, using the distal internal carotid diameter as the denominator. Multiphase CT imaging of the brain was performed following IV bolus contrast injection. Subsequent parametric perfusion maps were calculated using RAPID software. RADIATION DOSE REDUCTION: This exam was performed according to the departmental dose-optimization program which includes automated exposure control, adjustment of the mA and/or kV according to patient size and/or use of iterative reconstruction technique. CONTRAST:  156m OMNIPAQUE IOHEXOL 350 MG/ML SOLN COMPARISON:  Same-day CT head.  Carotid Doppler 03/19/2020. FINDINGS: CTA NECK FINDINGS  Aortic arch: There is mild calcified plaque in the imaged aortic arch. The origins of the major branch vessels are patent. The subclavian arteries are patent to the level imaged. Right carotid system: The right common, internal, and external carotid arteries are patent with calcified plaque at the bifurcation resulting in less than 50% stenosis of the proximal internal carotid artery. The distal internal carotid artery is patent the external carotid artery is patent. There is no dissection or aneurysm. Left carotid system: The left common carotid artery is patent. There is bulky plaque at the bifurcation resulting in up to approximately 70% stenosis. The distal internal carotid artery is patent. The external carotid artery is patent. There is no evidence of dissection or aneurysm. Vertebral arteries: The vertebral arteries are patent, without hemodynamically significant stenosis or occlusion. There is no evidence of dissection or aneurysm Skeleton: There is no acute osseous abnormality or suspicious osseous lesion. There is no visible canal hematoma. Other neck: The soft tissues of the neck are unremarkable. Upper chest: The imaged lung apices are clear. Review of the MIP images confirms the above findings CTA HEAD FINDINGS Anterior circulation: There is extensive calcified plaque in the carotid siphons resulting severe stenosis bilaterally. The left M1 segment is patent. There is short-segment severe stenosis of left M2 branches in the sylvian fissure proximally (8-117). There is no evidence of occlusion. There is moderate stenosis of the distal right M1 segment. There is focal moderate stenosis of a right M2 branch in the sylvian fissure (8-123). The distal branches are otherwise patent, without other proximal high-grade stenosis or occlusion. There is severe stenosis of the origin of the right A1 segment (6-162). Otherwise, the ACAs are patent, without other proximal high-grade stenosis or occlusion. The anterior  communicating artery is normal. There is no aneurysm or AVM. Posterior circulation: There is calcified plaque in the bilateral V4 segments resulting severe stenosis bilaterally. The PICA origins are not identified. The basilar artery is patent with moderate stenosis distally ([id number]. There is multifocal short-segment occlusion of the left P1 and P2 segments (8-134, 141). There is intermittent diminutive reconstitution of flow in the distal branches. There is a fetal origin of the right PCA. There is severe stenosis of the proximal P2 segment (8-137). There is multifocal irregularity and narrowing of the distal branches without evidence of occlusion. There is no aneurysm or AVM. Venous sinuses: Patent. Anatomic variants: None. Review of the MIP images confirms the above findings CT Brain Perfusion Findings: ASPECTS: 10 on the head CT obtained 5:18 p.m. CBF (<30%) Volume: 070mPerfusion (Tmax>6.0s) volume: 2083mismatch Volume: 24m33mfarction Location:No infarct core is identified. A portion of the ischemic brain is identified in the left occipital lobe which would correspond with the PCA occlusion. IMPRESSION: 1. Multifocal short-segment occlusions of the left P1 and P2 segments with diminutive distal reconstitution. 2. 20 cc ischemic penumbra identified  some of which is in the left occipital lobe which would correspond to the PCA occlusion. No core infarct identified. 3. Additional advanced intracranial atherosclerotic disease with multifocal severe stenoses in the anterior and posterior circulations as detailed above. 4. Bulky calcified plaque at the left carotid bifurcation resulting in approximately 70% stenosis of the proximal internal carotid artery. Mild plaque on the right without greater than 50% stenosis. Patent vertebral arteries in the neck. Findings discussed with Dr. Leonel Ramsay at 5:58 p.m. Electronically Signed   By: Valetta Mole M.D.   On: 02/05/2023 18:31   CT HEAD WO CONTRAST (5MM)  Result  Date: 02/19/2023 CLINICAL DATA:  Acute stroke suspected, weakness, hyperglycemia EXAM: CT HEAD WITHOUT CONTRAST TECHNIQUE: Contiguous axial images were obtained from the base of the skull through the vertex without intravenous contrast. RADIATION DOSE REDUCTION: This exam was performed according to the departmental dose-optimization program which includes automated exposure control, adjustment of the mA and/or kV according to patient size and/or use of iterative reconstruction technique. COMPARISON:  03/18/2020 FINDINGS: Brain: No evidence of acute infarction, hemorrhage, hydrocephalus, extra-axial collection or mass lesion/mass effect. Unchanged subtle white matter hypodensity of the left corona radiata (series 2, image 18). Unchanged encephalomalacia of the cerebellar hemispheres. Vascular: No hyperdense vessel or unexpected calcification. Skull: Normal. Negative for fracture or focal lesion. Sinuses/Orbits: No acute finding. Other: None. IMPRESSION: 1. No acute intracranial pathology. 2. 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 Electronically Signed   By: Delanna Ahmadi M.D.   On: 02/17/2023 17:37   DG Chest Portable 1 View  Result Date: 02/19/2023 CLINICAL DATA:  Shortness of breath. EXAM: PORTABLE CHEST 1 VIEW COMPARISON:  12/17/2022 FINDINGS: Previous median sternotomy and CABG procedure. Stable cardiac enlargement. Lung volumes are low. Slight asymmetric elevation of the right hemidiaphragm with blunting of the right costophrenic angle. No interstitial edema or airspace disease. Visualized osseous structures appear intact. No displaced rib fractures identified. IMPRESSION: 1. Suspect small right pleural effusion. 2. Low lung volumes. 3. Stable cardiac enlargement. Electronically Signed   By: Kerby Moors M.D.   On: 02/03/2023 13:17    Labs:  CBC: Recent Labs    01/29/2023 1143  02/19/2023 2013 02/10/2023 2320 02/25/23 0403 02/25/23 0537 02/25/23 1333 02/25/23 1932 02/25/23 2330 02/26/23 0433  WBC 17.6*  --  15.8*  --  17.8*  --   --   --  12.4*  HGB 6.6*   < > 9.1*   < > 7.0* 8.7* 9.8* 9.9* 10.0*  HCT 20.4*   < > 27.0*   < > 19.8* 24.9* 28.0* 28.2* 28.9*  PLT 171  --  164  --  171  --   --   --  154   < > = values in this interval not displayed.    COAGS: Recent Labs    06/13/22 1004 02/14/2023 1806 02/26/23 0433  INR 1.1 1.5* 1.2  APTT 28 36  --     BMP: Recent Labs    02/12/2023 1806 02/11/2023 2013 02/19/2023 2022 02/07/2023 2202 02/22/2023 2320 02/25/23 0403 02/25/23 0444 02/25/23 0537 02/26/23 0433  NA 129*   < > 128*   < > 132* 134* 133* 134* 132*  K 4.7   < > 4.9   < > 4.5 4.0 4.1 4.4 5.0  CL 98  --  97*  --  102  --   --  103 102  CO2 19*  --   --   --  19*  --   --  19* 19*  GLUCOSE 304*  --  311*  --  273*  --   --  188* 200*  BUN 71*  --  66*  --  61*  --   --  58* 62*  CALCIUM 7.9*  --   --   --  7.4*  --   --  7.5* 7.5*  CREATININE 3.02*  --  2.70*  --  2.77*  --   --  2.66* 3.37*  GFRNONAA 20*  --   --   --  23*  --   --  24* 18*   < > = values in this interval not displayed.    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 Post revasc/angioplasty to 70% with improved flow through and beyond the PCA. Awaiting MRI Pt remains sedated/intubated Hgb seems to have stabilized.  NIR will check back once MRI is performed.  Otherwise, will defer to CCM and Neuro for further management.   Electronically Signed: Pasty Spillers, PA 02/26/2023, 9:08 AM   I spent a total of 15 Minutes at the the patient's bedside AND on the  patient's hospital floor or unit, greater than 50% of which was counseling/coordinating care for PCA narrowing.

## 2023-02-26 NOTE — Progress Notes (Signed)
RT NOTE: RT transported patient on ventilator from room 4N26 to MRI and back to room 123XX123 with no complications.

## 2023-02-26 NOTE — Progress Notes (Signed)
eLink Physician-Brief Progress Note Patient Name: [name] DOB: 13-Oct-1946 MRN: [id number]   Date of Service  02/26/2023  HPI/Events of Note  Patient with sub-optimal BP control.  eICU Interventions  PRN Labetalol added to PRN Hydralazine to optimize BP control.        Rian Busche U Ivadell Gaul 02/26/2023, 1:13 AM

## 2023-02-26 NOTE — Progress Notes (Signed)
Permissive hypertension per Neurology.

## 2023-02-26 NOTE — TOC Initial Note (Signed)
Transition of Care [facility]) - Initial/Assessment Note    Patient Details  Name: [name] MRN: [id number] Date of Birth: 11/16/1946  [facility]) CM/SW Contact:    Benard Halsted, LCSW Phone Number: 02/26/2023, 3:57 PM  Clinical Narrative:                 CSW received request to speak with patient's daughter regarding SNF. CSW went over SNF process and options depending on how patient progresses and answered her questions. She stated her mom had been at [facility] before and that patient should also be 100% service connected with the [place]. She stated he makes too much money to qualify for Medicaid. She lives in [place] but will be here through the middle of next week. CSW will continue to follow.     Barriers to Discharge: Continued Medical Work up   Patient Goals and CMS Choice Patient states their goals for this hospitalization and ongoing recovery are:: Recovery CMS Medicare.gov Compare Post Acute Care list provided to:: Patient Represenative (must comment) Choice offered to / list presented to : Adult Children      Expected Discharge Plan and Services In-house Referral: Clinical Social Work     Living arrangements for the past 2 months: Single Family Home Expected Discharge Date: 01/31/2023                                    Prior Living Arrangements/Services Living arrangements for the past 2 months: Single Family Home Lives with:: Self Patient language and need for interpreter reviewed:: Yes Do you feel safe going back to the place where you live?: Yes      Need for Family Participation in Patient Care: Yes (Comment) Care giver support system in place?: Yes (comment)   Criminal Activity/Legal Involvement Pertinent to Current Situation/Hospitalization: No - Comment as needed  Activities of Daily Living      Permission Sought/Granted Permission sought to share information with : Facility Sport and exercise psychologist, Family  Supports Permission granted to share information with : No  Share Information with NAME: Dawn     Permission granted to share info w Relationship: Daughter  Permission granted to share info w Contact Information: [phone]  Emotional Assessment Appearance:: Appears stated age Attitude/Demeanor/Rapport: Unable to Assess Affect (typically observed): Unable to Assess Orientation: :  (Intubated) Alcohol / Substance Use: Not Applicable Psych Involvement: No (comment)  Admission diagnosis:  Stroke [facility]) [I63.9] 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, 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] Patient Active Problem List   Diagnosis Date Noted   DKA (diabetic ketoacidosis) ([place]) 02/03/2023   Non-ST elevation (NSTEMI) myocardial infarction ([place]) 02/04/2023   Acute CVA (cerebrovascular accident) ([place]) 02/19/2023   Acute on chronic diastolic CHF (congestive heart failure) ([place]) 02/02/2023   Acute anemia 02/06/2023   Leukocytosis 02/03/2023   Stroke ([place]) 02/20/2023   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   Overweight (BMI 25.0-29.9) 12/18/2022   Osteomyelitis ([place]) 11/23/2022   Restless leg syndrome 11/23/2022   Encounter for screening for malignant neoplasm of colon 06/25/2022   Osteomyelitis of great toe of right foot ([place]) 06/13/2022   Chronic kidney disease, stage 3a (HCC)    Chronic diastolic CHF (congestive heart failure) ([place])    Type II diabetes mellitus with renal manifestations ([place])    Depression    Atrial fibrillation, chronic ([place])    History of colonic polyps 05/05/2022   Chronic daily headache 11/10/2021   History of  stroke 11/10/2021   Difficulty sleeping 11/10/2021   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/12/2021   Hypersomnia with sleep apnea 05/12/2021   Long term (current) use of anticoagulants 05/12/2021   Other dysfunctions of sleep stages or arousal from sleep 05/12/2021   Posttraumatic stress disorder 05/12/2021   Presbyopia 05/12/2021   Psychosexual  dysfunction with inhibited sexual excitement 05/12/2021   Type 2 diabetes mellitus with hyperglycemia ([place]) 05/12/2021   Atherosclerosis of native arteries of the extremities with ulceration ([place]) 01/01/2021   Atrial flutter, paroxysmal ([place]) 03/21/2020   Postural dizziness with presyncope 03/18/2020   Hypoglycemia associated with type 2 diabetes mellitus ([place]) 03/18/2020   Hx of CABG 03/18/2020   History of CVA (cerebrovascular accident) 03/18/2020   Class 2 severe obesity due to excess calories with serious comorbidity and body mass index (BMI) of 35.0 to 35.9 in adult [facility]) 01/12/2020   Diabetic neuropathy ([place]) 01/04/2020   Occlusion and stenosis of vertebral artery 01/04/2020   Sciatica 01/04/2020   Health care maintenance 11/27/2019   Sleeping difficulty 09/13/2019   LVH (left ventricular hypertrophy) due to hypertensive disease, without heart failure 09/22/2018   CAD (coronary artery disease) 11/12/2016   Bilateral carotid artery disease ([place]) 07/14/2016   HTN (hypertension) 06/27/2016   HLD (hyperlipidemia) 06/27/2016   BPH (benign prostatic hyperplasia) 06/27/2016   AKI (acute kidney injury) ([place]) in the setting of stage II chronic kidney disease 06/27/2016   Vitamin D deficiency 03/30/2016   Vitamin B 12 deficiency 03/30/2016   At risk for psychosocial dysfunction 12/01/2015   PCP:  [name], MD Pharmacy:   [id number] [place] [address] [place]Phone: [phone]: [address]Phone: [phone]: [phone]    Social Determinants of Health ([place] Social [place]: [place] No Food Insecurity (12/18/2022)  Housing: Low Risk  (12/18/2022)  Transportation Needs: No Transportation Needs (12/18/2022)  Utilities: Not At Risk (12/18/2022)  Financial Resource Strain: Low Risk  (03/01/2018)   Physical Activity: Inactive (03/01/2018)  Social Connections: Unknown (03/01/2018)  Stress: No Stress Concern Present (03/01/2018)  Tobacco Use: Medium Risk (02/25/2023)   SDOH Interventions:     Readmission Risk Interventions    02/26/2023    3:55 PM 11/25/2022   12:26 PM  Readmission Risk Prevention Plan  Transportation Screening Complete Complete  HRI or Home Care Consult  Complete  Palliative Care Screening  Complete  Medication Review (RN Care Manager) Complete Complete  PCP or Specialist appointment within 3-5 days of discharge Complete   HRI or [place]Complete   SW Recovery Care/Counseling Consult Complete   PLouviersNot Applicable

## 2023-02-26 NOTE — Progress Notes (Signed)
CCM notified of low urine output.

## 2023-02-26 NOTE — Progress Notes (Signed)
Called to floor to check NPO status and patient availabilty for renal artery duplex.  Upon arrival informed by family patient just left floor for MRI.  Due to limited vascular lab hours, will re attempt tomorrow morning.   02/26/2023 4:58 PM Hanley Rispoli RVT, RDMS

## 2023-02-26 DEATH — deceased

## 2023-02-27 ENCOUNTER — Other Ambulatory Visit: Payer: Self-pay | Admitting: Physician Assistant

## 2023-02-27 ENCOUNTER — Inpatient Hospital Stay (HOSPITAL_COMMUNITY): Payer: Medicare Other

## 2023-02-27 DIAGNOSIS — Z7189 Other specified counseling: Secondary | ICD-10-CM | POA: Diagnosis not present

## 2023-02-27 DIAGNOSIS — I639 Cerebral infarction, unspecified: Secondary | ICD-10-CM

## 2023-02-27 DIAGNOSIS — J9622 Acute and chronic respiratory failure with hypercapnia: Secondary | ICD-10-CM

## 2023-02-27 DIAGNOSIS — G9341 Metabolic encephalopathy: Secondary | ICD-10-CM

## 2023-02-27 DIAGNOSIS — I214 Non-ST elevation (NSTEMI) myocardial infarction: Secondary | ICD-10-CM | POA: Diagnosis not present

## 2023-02-27 DIAGNOSIS — R4182 Altered mental status, unspecified: Secondary | ICD-10-CM

## 2023-02-27 DIAGNOSIS — Z515 Encounter for palliative care: Secondary | ICD-10-CM

## 2023-02-27 DIAGNOSIS — R58 Hemorrhage, not elsewhere classified: Secondary | ICD-10-CM

## 2023-02-27 DIAGNOSIS — Z66 Do not resuscitate: Secondary | ICD-10-CM | POA: Diagnosis not present

## 2023-02-27 DIAGNOSIS — I63212 Cerebral infarction due to unspecified occlusion or stenosis of left vertebral arteries: Secondary | ICD-10-CM

## 2023-02-27 DIAGNOSIS — D62 Acute posthemorrhagic anemia: Secondary | ICD-10-CM

## 2023-02-27 LAB — CBC
HCT: 25.6 % — ABNORMAL LOW (ref 39.0–52.0)
Hemoglobin: 8.5 g/dL — ABNORMAL LOW (ref 13.0–17.0)
MCH: 29.3 pg (ref 26.0–34.0)
MCHC: 33.2 g/dL (ref 30.0–36.0)
MCV: 88.3 fL (ref 80.0–100.0)
Platelets: 138 10*3/uL — ABNORMAL LOW (ref 150–400)
RBC: 2.9 MIL/uL — ABNORMAL LOW (ref 4.22–5.81)
RDW: 17.9 % — ABNORMAL HIGH (ref 11.5–15.5)
WBC: 7.8 10*3/uL (ref 4.0–10.5)
nRBC: 0.4 % — ABNORMAL HIGH (ref 0.0–0.2)

## 2023-02-27 LAB — BASIC METABOLIC PANEL
Anion gap: 10 (ref 5–15)
BUN: 76 mg/dL — ABNORMAL HIGH (ref 8–23)
CO2: 19 mmol/L — ABNORMAL LOW (ref 22–32)
Calcium: 7.4 mg/dL — ABNORMAL LOW (ref 8.9–10.3)
Chloride: 104 mmol/L (ref 98–111)
Creatinine, Ser: 4.46 mg/dL — ABNORMAL HIGH (ref 0.61–1.24)
GFR, Estimated: 13 mL/min — ABNORMAL LOW (ref >60)
Glucose, Bld: 115 mg/dL — ABNORMAL HIGH (ref 70–99)
Potassium: 4.8 mmol/L (ref 3.5–5.1)
Sodium: 133 mmol/L — ABNORMAL LOW (ref 135–145)

## 2023-02-27 LAB — MAGNESIUM: Magnesium: 2.9 mg/dL — ABNORMAL HIGH (ref 1.7–2.4)

## 2023-02-27 LAB — POCT I-STAT 7, (LYTES, BLD GAS, ICA,H+H)
Acid-base deficit: 8 mmol/L — ABNORMAL HIGH (ref 0.0–2.0)
Bicarbonate: 18.4 mmol/L — ABNORMAL LOW (ref 20.0–28.0)
Calcium, Ion: 1.09 mmol/L — ABNORMAL LOW (ref 1.15–1.40)
HCT: 24 % — ABNORMAL LOW (ref 39.0–52.0)
Hemoglobin: 8.2 g/dL — ABNORMAL LOW (ref 13.0–17.0)
O2 Saturation: 91 %
Patient temperature: 98.2
Potassium: 4.8 mmol/L (ref 3.5–5.1)
Sodium: 135 mmol/L (ref 135–145)
TCO2: 20 mmol/L — ABNORMAL LOW (ref 22–32)
pCO2 arterial: 42.1 mmHg (ref 32–48)
pH, Arterial: 7.247 — ABNORMAL LOW (ref 7.35–7.45)
pO2, Arterial: 70 mmHg — ABNORMAL LOW (ref 83–108)

## 2023-02-27 LAB — DIC (DISSEMINATED INTRAVASCULAR COAGULATION)PANEL
D-Dimer, Quant: 6.7 ug/mL-FEU — ABNORMAL HIGH (ref 0.00–0.50)
Fibrinogen: 787 mg/dL — ABNORMAL HIGH (ref 210–475)
INR: 1.3 — ABNORMAL HIGH (ref 0.8–1.2)
Platelets: 145 10*3/uL — ABNORMAL LOW (ref 150–400)
Prothrombin Time: 15.6 seconds — ABNORMAL HIGH (ref 11.4–15.2)
Smear Review: NONE SEEN
aPTT: 32 seconds (ref 24–36)

## 2023-02-27 LAB — GLUCOSE, CAPILLARY
Glucose-Capillary: 114 mg/dL — ABNORMAL HIGH (ref 70–99)
Glucose-Capillary: 89 mg/dL (ref 70–99)
Glucose-Capillary: 74 mg/dL (ref 70–99)
Glucose-Capillary: 149 mg/dL — ABNORMAL HIGH (ref 70–99)
Glucose-Capillary: 166 mg/dL — ABNORMAL HIGH (ref 70–99)
Glucose-Capillary: 168 mg/dL — ABNORMAL HIGH (ref 70–99)

## 2023-02-27 LAB — PHOSPHORUS: Phosphorus: 7.1 mg/dL — ABNORMAL HIGH (ref 2.5–4.6)

## 2023-02-27 MED ORDER — FUROSEMIDE 10 MG/ML IJ SOLN
80.0000 mg | Freq: Two times a day (BID) | INTRAMUSCULAR | Status: AC
  Administered 2023-02-27 (×2): 80 mg via INTRAVENOUS
  Filled 2023-02-27 (×2): qty 8

## 2023-02-27 MED ORDER — MIDAZOLAM BOLUS VIA INFUSION
0.0000 mg | INTRAVENOUS | Status: DC | PRN
  Administered 2023-02-27: 2 mg via INTRAVENOUS
  Administered 2023-02-27: 3 mg via INTRAVENOUS

## 2023-02-27 MED ORDER — METHYLNALTREXONE BROMIDE 12 MG/0.6ML ~~LOC~~ SOLN
12.0000 mg | Freq: Once | SUBCUTANEOUS | Status: AC
  Administered 2023-02-27: 12 mg via SUBCUTANEOUS
  Filled 2023-02-27: qty 0.6

## 2023-02-27 MED ORDER — ALBUMIN HUMAN 25 % IV SOLN
25.0000 g | Freq: Four times a day (QID) | INTRAVENOUS | Status: AC
  Administered 2023-02-27 (×3): 25 g via INTRAVENOUS
  Filled 2023-02-27 (×3): qty 100

## 2023-02-27 MED ORDER — MIDAZOLAM-SODIUM CHLORIDE 100-0.9 MG/100ML-% IV SOLN
0.0000 mg/h | INTRAVENOUS | Status: DC
  Administered 2023-02-27: 5 mg/h via INTRAVENOUS
  Administered 2023-02-28: 3 mg/h via INTRAVENOUS
  Filled 2023-02-27 (×3): qty 100

## 2023-02-27 MED ORDER — INSULIN GLARGINE-YFGN 100 UNIT/ML ~~LOC~~ SOLN
40.0000 [IU] | SUBCUTANEOUS | Status: DC
  Administered 2023-02-28 – 2023-03-01 (×2): 40 [IU] via SUBCUTANEOUS
  Filled 2023-02-27 (×2): qty 0.4

## 2023-02-27 MED ORDER — INSULIN ASPART 100 UNIT/ML IJ SOLN
4.0000 [IU] | INTRAMUSCULAR | Status: DC
  Administered 2023-02-27 – 2023-03-01 (×12): 4 [IU] via SUBCUTANEOUS

## 2023-02-27 MED ORDER — SIMVASTATIN 20 MG PO TABS
40.0000 mg | ORAL_TABLET | Freq: Every day | ORAL | Status: DC
  Administered 2023-02-28: 40 mg
  Filled 2023-02-27: qty 2

## 2023-02-27 MED ORDER — MIDAZOLAM-SODIUM CHLORIDE 100-0.9 MG/100ML-% IV SOLN: 0.5000 mg/h | INTRAVENOUS | Status: DC

## 2023-02-27 NOTE — Progress Notes (Signed)
SLP Cancellation Note  Patient Details Name: [name] MRN: [id number] DOB: October 07, 1946   Cancelled treatment:       Reason Eval/Treat Not Completed: Patient remains on the ventilator.  ST will continue efforts to complete cognitive/language evaluation.   Shelly Flatten, MA, [facility] SLP [phone]  Lamar Sprinkles 02/27/2023, 9:30 AM

## 2023-02-27 NOTE — Progress Notes (Signed)
STROKE TEAM PROGRESS NOTE   INTERVAL HISTORY His daughter and RN are at the bedside. RN at the bedside.  He remains intubated on Versed, fentanyl, precedex and off insulin gtt. Hb stable. Cre still trending up.   Vitals:   02/27/23 0744 02/27/23 0800 02/27/23 0900 02/27/23 1000  BP:  124/65 126/69 122/68  Pulse: 67 70 65 63  Resp: '18 18 18 16  '$ Temp:      TempSrc:      SpO2: 95% 91% 92% 92%  Weight:       CBC:  Recent Labs  Lab 02/21/2023 1143 02/17/2023 2013 02/25/23 0537 02/25/23 1333 02/26/23 0433 02/27/23 0304 02/27/23 1033  WBC 17.6*   < > 17.8*  --  12.4* 7.8  --   NEUTROABS 15.1*  --  15.4*  --   --   --   --   HGB 6.6*   < > 7.0*   < > 10.0* 8.5* 8.2*  HCT 20.4*   < > 19.8*   < > 28.9* 25.6* 24.0*  MCV 86.4   < > 87.2  --  88.1 88.3  --   PLT 171   < > 171  --  154 138*  --    < > = values in this interval not displayed.   Basic Metabolic Panel:  Recent Labs  Lab 02/26/23 0433 02/26/23 1809 02/27/23 0304 02/27/23 1033  NA 132*  --  133* 135  K 5.0  --  4.8 4.8  CL 102  --  104  --   CO2 19*  --  19*  --   GLUCOSE 200*  --  115*  --   BUN 62*  --  76*  --   CREATININE 3.37*  --  4.46*  --   CALCIUM 7.5*  --  7.4*  --   MG 3.0* 2.6* 2.9*  --   PHOS 6.0* 6.8* 7.1*  --    Lipid Panel:  Recent Labs  Lab 02/25/23 0537 02/26/23 0433  CHOL 126  --   TRIG 337* 666*  HDL 29*  --   CHOLHDL 4.3  --   VLDL 67*  --   LDLCALC 30  --    HgbA1c:  Recent Labs  Lab 02/25/23 0537  HGBA1C 8.6*   Urine Drug Screen:  Recent Labs  Lab 02/03/2023 1806  LABOPIA NONE DETECTED  COCAINSCRNUR NONE DETECTED  LABBENZ NONE DETECTED  AMPHETMU NONE DETECTED  THCU NONE DETECTED  LABBARB NONE DETECTED    Alcohol Level  Recent Labs  Lab 02/05/2023 1806  ETH <10    IMAGING past 24 hours MR BRAIN WO CONTRAST  Result Date: 02/26/2023 CLINICAL DATA:  Initial evaluation for neuro deficit, stroke. EXAM: MRI HEAD WITHOUT CONTRAST MRA HEAD WITHOUT CONTRAST TECHNIQUE:  Multiplanar, multi-echo pulse sequences of the brain and surrounding structures were acquired without intravenous contrast. Angiographic images of the Circle of Willis were acquired using MRA technique without intravenous contrast. COMPARISON:  Prior CT from 02/25/2023 as well as earlier studies. FINDINGS: MRI HEAD FINDINGS Brain: Cerebral volume within normal limits. Mild chronic microvascular ischemic disease for age. Small remote infarct noted at the left basal ganglia/corona radiata. Scattered chronic bilateral cerebellar infarcts, left greater than right. Patchy restricted diffusion involving the left greater than right cerebellum, consistent with acute ischemic infarcts. Additional patchy small volume acute ischemic changes noted within the left PCA distribution with involvement of the left thalamus. No associated hemorrhage or mass effect. No other evidence for  acute or subacute ischemia. No acute intracranial hemorrhage. Chronic microhemorrhage within the right cerebellum noted, stable. No mass lesion, midline shift or mass effect. No hydrocephalus or extra-axial fluid collection. Pituitary gland and suprasellar region within normal limits. Vascular: Major intracranial vascular flow voids are maintained. Skull and upper cervical spine: Craniocervical junction normal. Bone marrow signal intensity within normal limits. No scalp soft tissue abnormality. Sinuses/Orbits: Globes and orbital soft tissues within normal limits. Mild scattered mucosal thickening present about the paranasal sinuses. Small bilateral mastoid effusions. Patient is intubated. Other: None. MRA HEAD FINDINGS Anterior circulation: Visualized distal cervical segments of the internal carotid arteries are patent with antegrade flow. Atheromatous change within the carotid siphons with associated moderate multifocal narrowing on the left, and more mild narrowing on the right. Focal moderate proximal right A1 stenosis (series 553, image 8). Left A1  patent. Normal anterior communicating artery complex. Both anterior cerebral arteries patent without stenosis. Left M1 segment widely patent. Short-segment mild distal right M1 stenosis (series 553, image 6). No proximal MCA branch occlusion. Moderate small vessel atheromatous irregularity throughout the MCA branches with associated moderate multifocal narrowing. More severe left M3 stenosis noted as well (series 553, image 5). Posterior circulation: Both V4 segments patent without stenosis. Left vertebral artery slightly dominant. Left PICA grossly patent proximally. Right PICA not well seen. Basilar widely patent proximally with mild to moderate stenosis at the basilar tip. Superior cerebral arteries patent bilaterally. Left PCA supplied via the basilar. Multifocal severe tandem segmental stenoses involving the proximal and mid left P2 segment with severely attenuated flow distally, also seen on prior CTA. Fetal type origin of the right PCA. Right PCA irregular but patent proximally. Moderate distal right P2 stenosis noted (series 554, image 8). Right PCA remains patent to its distal aspect. Anatomic variants: As above.  No aneurysm. IMPRESSION: MRI HEAD IMPRESSION: 1. Patchy acute ischemic infarcts involving the left greater than right cerebellum as well as the left PCA distribution. No associated hemorrhage or significant mass effect. 2. 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 3. Underlying mild chronic microvascular ischemic disease for age. MRA HEAD IMPRESSION: 1. Advanced intracranial atherosclerotic disease. Most notable findings include severe tandem left P2 stenoses with severely attenuated flow distally, in keeping with the left PCA distribution infarct. Additional mild to moderate multifocal narrowing about the carotid siphons, right M1 segment, and distal MCA branches as above. 2. Overall, appearance is similar as  compared to prior CTA. Electronically Signed   By: Jeannine Boga M.D.   On: 02/26/2023 19:29   MR ANGIO HEAD WO CONTRAST  Result Date: 02/26/2023 CLINICAL DATA:  Initial evaluation for neuro deficit, stroke. EXAM: MRI HEAD WITHOUT CONTRAST MRA HEAD WITHOUT CONTRAST TECHNIQUE: Multiplanar, multi-echo pulse sequences of the brain and surrounding structures were acquired without intravenous contrast. Angiographic images of the Circle of Willis were acquired using MRA technique without intravenous contrast. COMPARISON:  Prior CT from 02/25/2023 as well as earlier studies. FINDINGS: MRI HEAD FINDINGS Brain: Cerebral volume within normal limits. Mild chronic microvascular ischemic disease for age. Small remote infarct noted at the left basal ganglia/corona radiata. Scattered chronic bilateral cerebellar infarcts, left greater than right. Patchy restricted diffusion involving the left greater than right cerebellum, consistent with acute ischemic infarcts. Additional patchy small volume acute ischemic changes noted within the left PCA distribution with involvement of the left thalamus. No associated hemorrhage or mass effect. No other evidence for acute or subacute ischemia. No acute  intracranial hemorrhage. Chronic microhemorrhage within the right cerebellum noted, stable. No mass lesion, midline shift or mass effect. No hydrocephalus or extra-axial fluid collection. Pituitary gland and suprasellar region within normal limits. Vascular: Major intracranial vascular flow voids are maintained. Skull and upper cervical spine: Craniocervical junction normal. Bone marrow signal intensity within normal limits. No scalp soft tissue abnormality. Sinuses/Orbits: Globes and orbital soft tissues within normal limits. Mild scattered mucosal thickening present about the paranasal sinuses. Small bilateral mastoid effusions. Patient is intubated. Other: None. MRA HEAD FINDINGS Anterior circulation: Visualized distal cervical  segments of the internal carotid arteries are patent with antegrade flow. Atheromatous change within the carotid siphons with associated moderate multifocal narrowing on the left, and more mild narrowing on the right. Focal moderate proximal right A1 stenosis (series 553, image 8). Left A1 patent. Normal anterior communicating artery complex. Both anterior cerebral arteries patent without stenosis. Left M1 segment widely patent. Short-segment mild distal right M1 stenosis (series 553, image 6). No proximal MCA branch occlusion. Moderate small vessel atheromatous irregularity throughout the MCA branches with associated moderate multifocal narrowing. More severe left M3 stenosis noted as well (series 553, image 5). Posterior circulation: Both V4 segments patent without stenosis. Left vertebral artery slightly dominant. Left PICA grossly patent proximally. Right PICA not well seen. Basilar widely patent proximally with mild to moderate stenosis at the basilar tip. Superior cerebral arteries patent bilaterally. Left PCA supplied via the basilar. Multifocal severe tandem segmental stenoses involving the proximal and mid left P2 segment with severely attenuated flow distally, also seen on prior CTA. Fetal type origin of the right PCA. Right PCA irregular but patent proximally. Moderate distal right P2 stenosis noted (series 554, image 8). Right PCA remains patent to its distal aspect. Anatomic variants: As above.  No aneurysm. IMPRESSION: MRI HEAD IMPRESSION: 1. Patchy acute ischemic infarcts involving the left greater than right cerebellum as well as the left PCA distribution. No associated hemorrhage or significant mass effect. 2. 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 3. Underlying mild chronic microvascular ischemic disease for age. MRA HEAD IMPRESSION: 1. Advanced intracranial atherosclerotic disease. Most notable findings  include severe tandem left P2 stenoses with severely attenuated flow distally, in keeping with the left PCA distribution infarct. Additional mild to moderate multifocal narrowing about the carotid siphons, right M1 segment, and distal MCA branches as above. 2. Overall, appearance is similar as compared to prior CTA. Electronically Signed   By: Jeannine Boga M.D.   On: 02/26/2023 19:29    PHYSICAL EXAM  Temp:  [98.2 F (36.8 C)-99.6 F (37.6 C)] 98.2 F (36.8 C) (03/02 0400) Pulse Rate:  [63-78] 63 (03/02 1000) Resp:  [16-19] 16 (03/02 1000) BP: (95-127)/(51-69) 122/68 (03/02 1000) SpO2:  [84 %-100 %] 92 % (03/02 1000) Arterial Line BP: (100-157)/(41-65) 150/63 (03/02 1000) FiO2 (%):  [60 %] 60 % (03/02 1033) Weight:  RN:2821382 kg] 118.2 kg (03/02 0500)  General - Well nourished, well developed, intubated on sedation.  Ophthalmologic - fundi not visualized due to noncooperation.  Cardiovascular - Regular rate and rhythm, not in afib.  Neuro - intubated on sedation, eyes closed, not following commands. With forced eye opening, eyes in mid position, not blinking to visual threat, doll's eyes sluggish, not tracking, PERRL. Corneal reflex present, gag and cough present. Breathing over the vent.  Facial symmetry not able to test due to ET tube.  Tongue protrusion not cooperative. On pain stimulation, BUE mild  withdraw. Sensation, coordination and gait not tested.  With pain stimulation, pt eyes open spontaneously and kept open, but not tracking, not eye movement, bilateral pupil dilated, but still responsive to light, b/l UE flexion posturing intermittently but not rhythmic, no movement b/l LEs. O2 sat down to 80s.    ASSESSMENT/PLAN [name] is a 77 y.o. male with history of uncontrolled type 2 diabetes, atrial fibrillation on Eliquis, CAD s/p CABG, CVA, hypertension, hyperlipidemia, HFpEF, restless leg syndrome who presents to the ED 2/2 weakness and hyperglycemia was  found to be aphasic  Stroke:  Acute cerebellar infarct L>R and punctate right PCA infarct s/p balloon angioplasty of left VBJ, etiology: Diffuse multivessel severe intracranial stenosis  CT head No acute abnormality.  Old left BG/CR lacunar infarcts CTA head & neck Multifocal short-segment occlusions of the left P1 and P2 segments with diminutive distal reconstitution.   CTP - 20 cc ischemic penumbra in the left occipital lobe  Cerebral angio Severe preocclusive stenosis of the left vertebral basilar junction at the origin of the left posterior inferior cerebellar artery. High-grade stenosis of the left P2 segment, in the P3 segment. S/p balloon angioplasty of left VBJ Repeat CT head No acute intracranial abnormality.  MRI extensive scattered cerebellar infarcts, L>R. Punctate left PCA infarcts.  MRA - Advanced intracranial atherosclerotic disease. Most notable findings include severe tandem left P2 stenoses with severely attenuated flow distally. Additional mild to moderate multifocal narrowing about the carotid siphons, right M1 segment, and distal MCA branches  2D Echo EF 50-55% hypertrophy  LDL 30 HgbA1c 11.0 VTE prophylaxis - SCD's Eliquis  prior to admission, now on no antithrombotics. Will restart antithrombotics when bleeding stable and anemia improves. Therapy recommendations:  pending Disposition:  pending  SIRPIDs  With pain stimulation, pt eyes wide open, but not blinking or tracking, no eye movement, bilateral pupil dilated, but still responsive to light, b/l UE flexion posturing intermittently but not rhythmic, no movement b/l LEs. O2 sat down to 80s.  EEG no seizure, consistent with encephalopathy  CAD s/p CABG x 3 (2017) Acute on chronic diastolic heart failure PAF on eliquis Eliquis on hold due to RP bleed Cardiology following  Will consider antithrombotics once apporopriate  Hx of hypertension Hypertensive Urgency -> hypotension Home meds:  norvasc, losartan,  metoprolol,  Stable now Off Cleviprex gtt or pressors Long-term BP goal normotensive  Hyperlipidemia Home meds:  zocor '80mg'$  LDL 30, goal < 70 decrease zocor to 40 Continue statin at discharge  Hx of stroke 06/2016 right pontine infarct on MRI. CUS neg, EF 55-60%, A1C 13.3, elevated TG. Discharged with plavix  Diabetes type II Uncontrolled DKA Home meds:  insulin, metformin, semaglutide HgbA1c 8.6, goal < 7.0 Currently off insulin gtt -> on insulin subq CBGs SSI Will need close outpatient follow   Acute hypoxic respiratory failure  Leukocytosis  Intubated - management by CCM On Versed and fentanyl and Precedex CXR as needed  WBC 17.6-12.4-7.8 On rocephin  Acute blood loss anemia Retroperitoneal bleed Hgb 5.1->7.0->10.0 ->8.2 transfused blood 2/29 CT abdomen pelvis 2/29 moderate right renal subcapsular hematoma with moderate distention of hemorrhage into the posterior pararenal space Hold off antithrombotics Management per primary team  AKI Cre 2.70->2.66-> 2.96-> 3.37-> 4.46 On IVF and TF CCM on board Consider nephrology consult  Dysphagia  OGT placed.  On TF @ 21 and IVF Speech on board  Other Stroke Risk Factors Advanced Age >/= 46  Obesity, Body mass index is 35.34 kg/m., BMI >/= 30  associated with increased stroke risk, recommend weight loss, diet and exercise as appropriate   Other Active Problems Hyponatremia Na 134->132->135 Rib fracture - right 11th and 12th rib fracture on [facility] day # 3  This patient is critically ill due to stroke, intracranial vascular stenosis, severe anemia, retroperitoneal hemorrhage, AKI, respiratory failure and at significant risk of neurological worsening, death form recurrent stroke, hemorrhagic transformation, hypovolemic shock, sepsis, heart failure, renal failure. This patient's care requires constant monitoring of vital signs, hemodynamics, respiratory and cardiac monitoring, review of multiple databases,  neurological assessment, discussion with family, other specialists and medical decision making of high complexity. I spent 45 minutes of neurocritical care time in the care of this patient. I had long discussion with daughter at bedside, updated pt current condition, treatment plan and potential prognosis, and answered all the questions.  She expressed understanding and appreciation.  I also discussed with CCM Dr. Erin Fulling.  Rosalin Hawking, MD PhD Stroke Neurology 02/27/2023 6:29 PM   To contact Stroke Continuity provider, please refer to [email or web address] After hours, contact General Neurology

## 2023-02-27 NOTE — Progress Notes (Signed)
PT Cancellation Note  Patient Details Name: [name] MRN: [id number] DOB: November 08, 1946   Cancelled Treatment:    Reason Eval/Treat Not Completed: Medical issues which prohibited therapy remain today. Per discussion with RN, pt remains under sedation, will continue to follow and evaluate when medically appropriate.   West Carbo, PT, DPT   Acute Rehabilitation Department Office [phone] Secure Chat Communication Preferred   Sandra Cockayne 02/27/2023, 12:47 PM

## 2023-02-27 NOTE — Progress Notes (Addendum)
NAME:  [name], MRN:  [id number], DOB:  October 14, 1946, LOS: 3 ADMISSION DATE:  02/20/2023, CONSULTATION DATE:  01/29/2023 REFERRING MD: Lorrin Goodell - Neuro CHIEF COMPLAINT: Acute stroke s/p thrombectomy  History of Present Illness:   77 year old man who initially presented to [facility] 2/28 for 2-3 days of weakness. S/p fall at home 2 days PTA. Transferred to [facility] 2/28PM for mechanical thrombectomy. Also with hyperkalemia in the setting of AKI and DKA. PMHx significant for HTN, HLD, CAD (s/p CABG x 3 2017), HFpEF (Echo 01/2023 with EF 50-55%), AF (on Eliquis), CVA (R pontine stroke, cerebellar stroke), T2DM, BPH, depression.  At [facility], patient was noted to have difficulty answering questions and word-finding. Patient's son reported a fall at home 2-3 days PTA that has limited patient's mobility due to pain; he felt there was a possibility patient had not been taking his medications. At baseline, patient lives alone and is independent with all ADLs. Labs at [facility] were notable for pH 7.32, pCO2 33, CBG 787. WBC 17.6, Hgb 6.6. Na 122, K 6.3, bicarb 16 (AG 14), BUN/Cr 70/2.97. BNP 685, Trop 2280. B-hydroxybutyrate 3.77, LA 2.7. UA +glucose, ketones, protein. C/f DKA and NSTEMI, insulin gtt started and Cards consulted with recommendation to defer [facility] in the setting of Hgb drift, ?bleed. Code Stroke was called for AMS and CT Head NAICA, consistent with prior infarcts. CTA Head/Neck showed L PCA (P1 P2) occlusions, L carotid plaque (70% stenosis). Decision for transfer to [facility] for emergent mechanical thrombectomy.  Underwent endovascular revascularization of high-grade L vertebral junction with balloon angioplasty (patency 70%) with NIR (Deveshwar) 2/28PM; post-procedure CT without ICH. Repeat CT Head completed 2/29 as patient was slow to wake from sedation, NAICA/stable. CT A/P was completed 2/29AM in the setting of Hgb drift with unknown cause, demonstrating moderate R renal subcapsular hematoma with extension  of hemorrhage into the posterior pararenal space and rib fractures (R 11th and 12th ribs); suspect this is related to recent fall at home PTA.  Remains critically in in 4N Neuro ICU.  Pertinent Medical History:   Past Medical History:  Diagnosis Date   Atrial fibrillation ([place])    a.) CHA2DS2VASc = 6 (age x 2,  HTN, CVA/TIA x 2, T2DM);  b.) s/p DCCV 09/25/2020 (120 J x1); c.) rate/rhythm maintained on oral metoprolol tartrate; chronically anticoagulated with apixaban   BPH (benign prostatic hyperplasia)    CAD (coronary artery disease) 10/28/2016   a.) LHC 10/28/2016: 95% dLAD, 90% p-mLCx, 75% mLAD, 60% m-dLAD, 99% p-mRCA, 75% dRCA --> consult CVTS; b.) 3v CABG at [facility] 11/05/2016   Carotid artery disease ([place]) 06/29/2016   a.) carotid US 06/29/2016: <50% BICA   Cerebellar stroke ([place]) 11/13/2015   a.) CT imaging 11/13/2015 revealed old/remote LEFT cerebellar and LEFT carona radiata infarcts   CHF (congestive heart failure) (HCC)    Depression    Diabetic foot ulcers (HCC)    Diastolic dysfunction [id number]   a.) TTE 06/28/2016: EF 55-60%, G1DD; b.) TTE 03/19/2020: EF 55-60%, mild BAE, mild-mod MR/TR.   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 Found to be in DKA with c/f recurrent CVA (L PCA?). Also with NSTEMI, elevated trop. Taken to [facility] for ?thrombectomy, underwent endovascular revasc/balloon angioplasty of L vertebral/PCA, no stent. 2/29 - Slow to wake up post-sedation wean, repeat CT Head NAICA. Hgb 6.6, CT A/P with renal subcapsular hematoma/extension into posterior RP space, rib fx on R (11/12). Hgb 7.0, 2U PRBCs ordered. Remains intubated, sedated on insulin gtt. TF started. 3/01 - Sedation adjusted overnight to Versed/Fentanyl in the setting of elevated TG (Prop discontinued). Versed discontinued and Precedex initiated. Cleviprex transitioned to Cardene. Insulin gtt weaned off to SQ insulin. Hgb stable 10.0. Cr worse, US Renal with doppler pending. MRI/MRA Brain pending.  Interim History / Subjective:  No significant events overnight Hgb 8.5 from 10 Intermittently agitated with desaturations US Renal + Doppler results pending  Objective:  Blood pressure 124/65, pulse 70, temperature 98.2 F (36.8 C), temperature source Oral, resp. rate 18, weight 118.2 kg, SpO2 91 %.    Vent Mode: PRVC FiO2 (%):  [60 %] 60 % Set Rate:  [18 bmp] 18 bmp Vt Set:  [620 mL] 620 mL PEEP:  [10 cmH20] 10 cmH20 Plateau Pressure:  [20 cmH20-25 cmH20] 24 cmH20   Intake/Output Summary (Last 24 hours) at 02/27/2023 0910 Last data filed at 02/27/2023 0800 Gross per 24 hour  Intake 4399.58 ml  Output 415 ml  Net 3984.58 ml   Filed Weights   02/25/23 0530 02/26/23 0702 02/27/23 0500  Weight: 113.7 kg 119.7 kg 118.2 kg   Physical Examination: General: Acutely ill-appearing elderly man in NAD. Intubated, sedated, obese HEENT: [place]/AT, anicteric sclera, PERRL 38m, moist mucous membranes. ETT/OGT in place. Neuro: Sedated.  Not following commands.  +Corneal, +Cough, and +Gag  CV: RRR, no m/g/r. PULM: Breathing even and unlabored on vent. Course breath sounds. GI: Obese, soft, nontender, mild-moderate distention. Hypoactive bowel  sounds. Extremities: Bilateral symmetric 1+ LE edema noted. Amputations of toes of R foot (great toe + 2-4th digit), 5th digit intact with mild nail overgrowth/ulceration - no fluctuance. Skin: Warm/dry, no rashes.  Resolved Hospital Problem List:     Assessment & Plan:  Acute PCA stroke s/p endovascular revascularization/balloon angioplasty, no stent - Neuro/Stroke following, appreciate recommendations - NIR following post-procedure - Goal SBP liberalized to < 180 now >24H post - Cleviprex/Cardene weaned off - Wean sedation as able for neuro exam post-MRI - Frequent neuro checks - Neuroprotective measures: HOB > 30 degrees, normoglycemia, normothermia, electrolytes WNL - PT/OT/SLP when able to participate in care  Acute hypoxemic respiratory failure, need for mechanical ventilation in the setting of AMS - Continue full vent support (4-8cc/kg IBW) - Wean FiO2 for O2 sat > 90% - Daily WUA/SBT once appropriate from a mental status standpoint - VAP bundle - Pulmonary hygiene - PAD protocol for sedation: Fentanyl and Versed for goal RASS -1 to -2 - Follow CXR  DKA T2DM - AG closed - Insulin gtt weaned off 3/1, transition to SQ insulin - Semglee 40U daily (home dose 100U) - TF coverage 4U Q4H - SSI - CBGs Q4H, goal 140-180  Elevated troponin CAD s/p  CABG x 3 (2017) Acute on chronic diastolic heart failure Paroxysmal atrial fibrillation - Cardiology consulted, appreciate recommendations - Troponin elevation likely demand-related, cannot r/o ACS given cardiac history - Echo with EF 50-55%, +RWMAs, mid concentric LVH - Continue statin - Hold ASA - Hold Eliquis in the setting of subcapsular hematoma/RP bleed, will need to revisit AC once more stable - Cardiac monitoring - Optimize lytes as able (K > 4, Mg > 2)  Oliguric AKI in setting of stage II CKD, also with subcapsular hematoma, ?Page kidney physiology Hyperkalemia, improved Some concern for Page kidney physiology, given  known R subcapsular hematoma and initially difficult-to-control hypertension despite Cleviprex. - Trend BMP, Cr uptrending - Replete electrolytes as indicated - Monitor I&Os - Avoid nephrotoxic agents as able - Ensure adequate renal perfusion - US Renal + doppler to assess flow into kidney - D/w Nephro, recommend Albumin 25g x 3 and Lasix challenge. Will challenge again today but will likely need CRRT starting tomorrow - D/w IR, even if Page kidney - unable to intervene due to typical viscosity of blood products, would need surgical intervention  Leukocytosis, likely reactive in the setting of multiple insults ?Punctate foci of gas near bleed - Trend WBC, fever curve - Continue ceftriaxone, Flagyl, vanc (MRSA screen +) - Continue to monitor  Thrombocytopenia - monitor - check DIC panel  ABLA in the setting of subcapsular hematoma/RP bleed S/p 3 units PRBC at [facility], 2 additional 2/29. - Trend H&H - Monitor for signs of active bleeding - Transfuse for Hgb < 8.0 (given cardiac history) or hemodynamically significant bleeding - Eliquis on hold - If H&H drifting, low threshold to repeat abdominal imaging  Right 11th and 12th rib fractures - Supportive care/pain control  Concern for infected fifth toe, right foot - obtain foot x-ray to check for soft tissue infection  Best Practice: (right click and "Reselect all SmartList Selections" daily)   Diet/type: tubefeeds DVT prophylaxis: SCD GI prophylaxis: PPI Lines: N/A Foley:  Yes, and it is still needed Code Status:  full code Last date of multidisciplinary goals of care discussion [3/1 - D/w daughter, Arrie Aran, at bedside - remains full code at present.]  Critical care time:    The patient is critically ill with multiple organ system failure and requires high complexity decision making for assessment and support, frequent evaluation and titration of therapies, advanced monitoring, review of radiographic studies and interpretation of  complex data.   Critical Care Time devoted to patient care services, exclusive of separately billable procedures, described in this note is 45 minutes.  Freda Jackson, MD [facility] Office: [phone]   See Amion for personal pager PCCM on call pager [phone] until 7pm. Please call Elink 7p-7a. [phone]

## 2023-02-27 NOTE — Consult Note (Signed)
Palliative Care Consult Note                                  Date: 02/27/2023   Patient Name: [name]  DOB: 21-Jun-1946  MRN: [id number]  Age / Sex: 77 y.o., male  PCP: [name], MD Referring Physician: Freddi Starr, MD  Reason for Consultation: Establishing goals of care  HPI/Patient Profile: Palliative Care consult requested for goals of care discussion in this 77 y.o. male  with past medical history of CAD s/p CABG, CVA, atrial fibrillation ([place]), HTN, HLD, uncontrolled diabetes type 2, and HFpEF. He was admitted on 02/13/2023 from home with hyperglycemia and weakness. Per son, patient fell several days prior to admission and began having difficulty moving due to pain. Work-up showed CBC 787, hemoglobin 6.6, BUN 70, CR 2.97, BNP 685. Patient started on DKA protocol. NSTEMI with elevated troponin of 2200.   Past Medical History:  Diagnosis Date   Atrial fibrillation ([place])    a.) CHA2DS2VASc = 6 (age x 2,  HTN, CVA/TIA x 2, T2DM);  b.) s/p DCCV 09/25/2020 (120 J x1); c.) rate/rhythm maintained on oral metoprolol tartrate; chronically anticoagulated with apixaban   BPH (benign prostatic hyperplasia)    CAD (coronary artery disease) 10/28/2016   a.) LHC 10/28/2016: 95% dLAD, 90% p-mLCx, 75% mLAD, 60% m-dLAD, 99% p-mRCA, 75% dRCA --> consult CVTS; b.) 3v CABG at [facility] 11/05/2016   Carotid artery disease ([place]) 06/29/2016   a.) carotid US 06/29/2016: <50% BICA   Cerebellar stroke ([place]) 11/13/2015   a.) CT imaging 11/13/2015 revealed old/remote LEFT cerebellar and LEFT carona radiata infarcts   CHF (congestive heart failure) (HCC)    Depression    Diabetic foot ulcers (HCC)    Diastolic dysfunction Q000111Q   a.) TTE 06/28/2016: EF 55-60%, G1DD; b.) TTE 03/19/2020: EF 55-60%, mild BAE, mild-mod MR/TR.   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[place]) 06/28/2016   a.) MRI 06/28/2016: acute RIGHT paracentral pontine (non-hemorrhagic) CVA   S/P CABG x 3 11/05/2016   a.) LIMA-LAD, SVG-OM1, SVG-PDA   TIA (transient ischemic attack)    a.) multiple since last CVA in 2017 per daughter   Tubular adenoma of colon    Type 2 diabetes mellitus treated with insulin (HCC)      Subjective:   This NP [name] reviewed medical records, received report from team, assessed the patient and then met at the patient's bedside with patient's daughter, [name] and son, [name] was involved via speaker phone to discuss diagnosis, prognosis, [place], EOL wishes disposition and options.  Mr. Kuehler is unable to engage in discussions. Remains intubated. Unresponsive. Does not follow commands.    Concept of Palliative Care was introduced as specialized medical care for people and their families living with serious illness.  It focuses on providing relief from the symptoms and stress of a serious illness.  The goal is to improve quality of life for both the patient and the family. Values and goals of care important to patient and family were attempted to be elicited.  I created space and opportunity for  family to explore state of health prior to admission, thoughts, and feelings.   [name] reports patient lives alone. His son [name] lives on the same street. Patient is widowed. Wife of more than 50 years passed in 2017. He served in [place]. Served for many years in [employer]. Originally from [place], [place].  Family reports patient lives alone and has been independent of ADLs, however daughter identifies health decline making it harder for patient to live alone. She has been trying to get patient to relocate back to [place] with her over the past year however he continued to decline.  Patient's son is local however was minimally involved in his care. [name] shares patient's health and will to live has declined significantly since the passing of their mom in 2017. Family knows there was some level of loneliness and depression due to her passing. Mr. Logan did not care for himself as he should and would spend most of his time on the couch eating and watching tv.   We discussed His current illness and what it means in the larger context of His on-going co-morbidities. Natural disease trajectory and expectations were discussed.  Both [name]. verbalized understanding of current illness and co-morbidities. They are realistic in their understanding. [name] expresses she does not think her father would want to be intubated and more specifically long term. She speaks to sadness of having to make such decisions as she does not want them to make decisions prematurely but also want to honor what they know he would most likely want for himself. Emotional support provided.   We discussed at length patient's previously expressed wishes and focused on his quality of life. Family as appropriate questions regarding tracheostomy, mechanical ventilation if no improvement, and what Mr. Keleher's QOL would look like best and worst case scenario.   Family expressed time to discuss amongst themselves again with consideration of patient's previous discussions.   I discussed the importance of continued conversation with family and their medical providers regarding overall plan of care and treatment options, ensuring decisions are within the context of the patients values and GOCs.  Questions and concerns were addressed.  Hard Choices booklet left for review. The family was encouraged to call with questions or concerns.  PMT will continue to support holistically as needed.  Objective:   Primary Diagnoses: Present on Admission:  Stroke [facility])  Vertebrobasilar artery stenosis  Non-ST elevation  (NSTEMI) myocardial infarction (HCC)   Scheduled Meds:  Chlorhexidine Gluconate Cloth  6 each Topical Daily   docusate  100 mg Per Tube BID   feeding supplement (PROSource TF20)  60 mL Per Tube Daily   furosemide  80 mg Intravenous BID   insulin aspart  0-20 Units Subcutaneous Q4H   insulin aspart  4 Units Subcutaneous Q4H   [START ON 02/28/2023] insulin glargine-yfgn  40 Units Subcutaneous Q24H   mupirocin ointment  1 Application Nasal BID   mouth rinse  15 mL Mouth Rinse Q2H   pantoprazole (PROTONIX) IV  40 mg Intravenous Q12H   polyethylene glycol  17 g Per Tube BID   vancomycin variable dose per unstable renal function (pharmacist dosing)   Does not apply See admin instructions    Continuous Infusions:  sodium chloride Stopped (02/26/23 1623)   albumin human Stopped (02/27/23 1318)   cefTRIAXone (ROCEPHIN)  IV Stopped (02/27/23 1049)   dexmedetomidine (PRECEDEX) IV infusion Stopped (02/27/23 1149)   dextrose 40 mL/hr at 02/27/23 1500   feeding supplement (VITAL AF 1.2  CAL) 60 mL/hr at 02/27/23 1500   fentaNYL infusion INTRAVENOUS 300 mcg/hr (02/27/23 1600)   metronidazole Stopped (02/27/23 1018)   midazolam 2 mg/hr (02/27/23 1500)   niCARDipine      PRN Meds: sodium chloride, acetaminophen **OR** acetaminophen (TYLENOL) oral liquid 160 mg/5 mL **OR** acetaminophen, dextrose, dextrose, fentaNYL, midazolam, mouth rinse  No Known Allergies  Review of Systems  Unable to perform ROS: Intubated    Physical Exam General: intubated, sedated, obese frail chronically-ill appearing Cardiovascular: regular rate and rhythm Pulmonary: Ventilated Abdomen: soft, nontender, mild distention, hypo bowel sounds Extremities: bilateral lower extremity edema, toe amputations to right foot Skin: no rashes, warm and dry Neurological: Unable to assess  Vital Signs:  BP 132/61   Pulse 76   Temp 97.7 F (36.5 C) (Axillary)   Resp 18   Wt 118.2 kg   SpO2 100%   BMI 35.34 kg/m  Pain  Scale: CPOT      SpO2: SpO2: 100 % O2 Device:SpO2: 100 % O2 Flow Rate: .   IO: Intake/output summary:  Intake/Output Summary (Last 24 hours) at 02/27/2023 1620 Last data filed at 02/27/2023 1500 Gross per 24 hour  Intake 4329.29 ml  Output 760 ml  Net 3569.29 ml    LBM: Last BM Date :  (pta) Baseline Weight: Weight: 110.5 kg Most recent weight: Weight: 118.2 kg      Palliative Assessment/Data: Intubated   Advanced Care Planning:   Primary Decision Maker: NEXT OF KIN Children [name] and [name]  Code Status/Advance Care Planning: DNR  A discussion was had today regarding advanced directives. Concepts specific to code status, artifical feeding and hydration, continued IV antibiotics and rehospitalization was had.  The difference between a aggressive medical intervention path and a palliative comfort care path was discussed.   Family reports patient has a documented advanced directive however have been unable to locate. Son is at the home now trying to locate documents. They also plan to go to safe deposit box on Monday to look for documents. [name] reports from previous documents she was listed as primary HCPOA. Education provided to family in the setting on no document legally both children are equal decision makers as next of kin. They verbalized understanding and currently are in mutual agreement regarding decisions. They are open and preparing to continue further discussions and make decisions together in absence of documents.   I empathetically approached discussions regarding current full code status with consideration of co-morbidities, current illness, and active intubation. Both children mutually agree that patient has always expressed wishes for DNR/DNI not wanting life prolonging measures. Given he his intubated would like to continue with current care allowing them time to make complex decisions in the future. [name] and [name] verbally expressed wishes for no heroic measures  if patient suffered cardiopulmonary event while ventilated. At that time their desire would be for a natural death. Education provided on what DNR would look like for patient. They verbalized understanding again verbalizing wishes for DNR.   [name] speaks to patient's quality of life expressing he would not want to live if he was unable to care for himself or had to be in a facility for the remainder of his life. They do not feel he would want to live with life-prolonging tubes or drains.   Assessment & Plan:   SUMMARY OF RECOMMENDATIONS   DNR-as requested and confirmed by children mutually.  Continue with current plan of care. Treat the treatable aggressively with limitations of DNR allowing patient  and opportunity to thrive. Extensive goals of care discussions. Family realistic. Expressed patient would not want life prolonged long-term. Unsure he would want to pursue tracheostomy if needed or feeding tube. They are looking for advanced directives also children request time to engage in discussions amongst each other allowing for time to make best decisions based on father's previously expressed wishes and quality of life.  In the absence of advanced directives children understand they are both mutual decision makers legally.  Ongoing goals of care discussions and support.  PMT will continue to support and follow. Please call team line or secure chat with urgent needs.  Symptom Management:  Per Attending  Palliative Prophylaxis:  Aspiration, Eye Care, Frequent Pain Assessment, Oral Care, and Turn Reposition  Additional Recommendations (Limitations, Scope, Preferences): DNR, continue to treat the treatable as family engage in discussions   Psycho-social/Spiritual:  Desire for further Chaplaincy support: no Additional Recommendations:  Ongoing goals of care/family support   Prognosis:  Guarded-Poor   Discharge Planning:  To Be Determined   Discussed with: family and TK, RN.   Children  ([name] and [place]) expressed understanding and was in agreement with this plan.    Time Total: 75 min   Visit consisted of counseling and education dealing with the complex and emotionally intense issues of symptom management and palliative care in the setting of serious and potentially life-threatening illness.Greater than 50%  of this time was spent counseling and coordinating care related to the above assessment and plan.  Signed by:  Alda Lea, AGPCNP-BC [place]   Phone: [phone] Pager: [phone] Amion: Bjorn Pippin   Thank you for allowing the Palliative Medicine Team to assist in the care of this patient. Please utilize secure chat with additional questions, if there is no response within 30 minutes please call the above phone number. Palliative Medicine Team providers are available by phone from 7am to 5pm daily and can be reached through the team cell phone.  Should this patient require assistance outside of these hours, please call the patient's attending physician.  *Please note that this is a verbal dictation therefore any spelling or grammatical errors are due to the "[employer]" system interpretation.

## 2023-02-27 NOTE — Procedures (Signed)
History: 77 year old male with posterior circulation stroke  Sedation: Versed and fentanyl  Technique: This EEG was acquired with electrodes placed according to the International 10-20 electrode system (including Fp1, Fp2, F3, F4, C3, C4, P3, P4, O1, O2, T3, T4, T5, T6, A1, A2, Fz, Cz, Pz). The following electrodes were missing or displaced: none.   Background: The background consists of low voltage irregular slow activity with occasional brief runs of theta range activity which are centrally predominant.  There is no epileptiform discharges or posterior dominant rhythm seen.  Photic stimulation: Physiologic driving is now performed  EEG Abnormalities: 1) generalized irregular slow activity 2) absent posterior dominant rhythm  Clinical Interpretation: This EEG is consistent with a generalized nonspecific cerebral dysfunction(encephalopathy), as can be seen with sedating medications among other causes. There was no seizure or seizure predisposition recorded on this study. Please note that lack of epileptiform activity on EEG does not preclude the possibility of epilepsy.   Roland Rack, MD [facility] [phone]  If 7pm- 7am, please page neurology on call as listed in [place].

## 2023-02-27 NOTE — Progress Notes (Signed)
EEG complete - results pending 

## 2023-02-27 NOTE — Progress Notes (Signed)
Renal artery duplex has been completed.  Very limited due to patient condition/habitus.   Results can be found under chart review under CV PROC. 02/27/2023 1:27 PM Maylen Waltermire RVT, RDMS

## 2023-02-28 ENCOUNTER — Inpatient Hospital Stay (HOSPITAL_COMMUNITY): Payer: Medicare Other

## 2023-02-28 DIAGNOSIS — J9602 Acute respiratory failure with hypercapnia: Secondary | ICD-10-CM

## 2023-02-28 DIAGNOSIS — I480 Paroxysmal atrial fibrillation: Secondary | ICD-10-CM

## 2023-02-28 DIAGNOSIS — N179 Acute kidney failure, unspecified: Secondary | ICD-10-CM

## 2023-02-28 LAB — CBC WITH DIFFERENTIAL/PLATELET
Abs Immature Granulocytes: 0.26 10*3/uL — ABNORMAL HIGH (ref 0.00–0.07)
Basophils Absolute: 0 10*3/uL (ref 0.0–0.1)
Basophils Relative: 0 %
Eosinophils Absolute: 0.2 10*3/uL (ref 0.0–0.5)
Eosinophils Relative: 2 %
HCT: 25.6 % — ABNORMAL LOW (ref 39.0–52.0)
Hemoglobin: 8.3 g/dL — ABNORMAL LOW (ref 13.0–17.0)
Immature Granulocytes: 4 %
Lymphocytes Relative: 9 %
Lymphs Abs: 0.6 10*3/uL — ABNORMAL LOW (ref 0.7–4.0)
MCH: 29.3 pg (ref 26.0–34.0)
MCHC: 32.4 g/dL (ref 30.0–36.0)
MCV: 90.5 fL (ref 80.0–100.0)
Monocytes Absolute: 0.6 10*3/uL (ref 0.1–1.0)
Monocytes Relative: 9 %
Neutro Abs: 5.7 10*3/uL (ref 1.7–7.7)
Neutrophils Relative %: 76 %
Platelets: 152 10*3/uL (ref 150–400)
RBC: 2.83 MIL/uL — ABNORMAL LOW (ref 4.22–5.81)
RDW: 18.6 % — ABNORMAL HIGH (ref 11.5–15.5)
WBC: 7.5 10*3/uL (ref 4.0–10.5)
nRBC: 0 % (ref 0.0–0.2)

## 2023-02-28 LAB — BASIC METABOLIC PANEL
Anion gap: 14 (ref 5–15)
Anion gap: 15 (ref 5–15)
BUN: 91 mg/dL — ABNORMAL HIGH (ref 8–23)
BUN: 102 mg/dL — ABNORMAL HIGH (ref 8–23)
CO2: 17 mmol/L — ABNORMAL LOW (ref 22–32)
CO2: 16 mmol/L — ABNORMAL LOW (ref 22–32)
Calcium: 7.6 mg/dL — ABNORMAL LOW (ref 8.9–10.3)
Calcium: 7.6 mg/dL — ABNORMAL LOW (ref 8.9–10.3)
Chloride: 104 mmol/L (ref 98–111)
Chloride: 105 mmol/L (ref 98–111)
Creatinine, Ser: 4.73 mg/dL — ABNORMAL HIGH (ref 0.61–1.24)
Creatinine, Ser: 5.06 mg/dL — ABNORMAL HIGH (ref 0.61–1.24)
GFR, Estimated: 12 mL/min — ABNORMAL LOW (ref >60)
GFR, Estimated: 11 mL/min — ABNORMAL LOW (ref >60)
Glucose, Bld: 149 mg/dL — ABNORMAL HIGH (ref 70–99)
Glucose, Bld: 204 mg/dL — ABNORMAL HIGH (ref 70–99)
Potassium: 4.9 mmol/L (ref 3.5–5.1)
Potassium: 4.8 mmol/L (ref 3.5–5.1)
Sodium: 135 mmol/L (ref 135–145)
Sodium: 136 mmol/L (ref 135–145)

## 2023-02-28 LAB — VANCOMYCIN, RANDOM: Vancomycin Rm: 21 ug/mL

## 2023-02-28 LAB — GLUCOSE, CAPILLARY
Glucose-Capillary: 139 mg/dL — ABNORMAL HIGH (ref 70–99)
Glucose-Capillary: 146 mg/dL — ABNORMAL HIGH (ref 70–99)
Glucose-Capillary: 160 mg/dL — ABNORMAL HIGH (ref 70–99)
Glucose-Capillary: 163 mg/dL — ABNORMAL HIGH (ref 70–99)
Glucose-Capillary: 174 mg/dL — ABNORMAL HIGH (ref 70–99)
Glucose-Capillary: 181 mg/dL — ABNORMAL HIGH (ref 70–99)

## 2023-02-28 MED ORDER — SENNOSIDES 8.8 MG/5ML PO SYRP
15.0000 mL | ORAL_SOLUTION | Freq: Every day | ORAL | Status: DC
  Administered 2023-02-28: 15 mL
  Filled 2023-02-28: qty 15

## 2023-02-28 MED ORDER — FUROSEMIDE 10 MG/ML IJ SOLN
80.0000 mg | Freq: Four times a day (QID) | INTRAMUSCULAR | Status: AC
  Administered 2023-02-28 – 2023-03-01 (×3): 80 mg via INTRAVENOUS
  Filled 2023-02-28 (×3): qty 8

## 2023-02-28 MED ORDER — SORBITOL 70 % SOLN
960.0000 mL | TOPICAL_OIL | Freq: Once | ORAL | Status: AC
  Administered 2023-02-28: 960 mL via RECTAL
  Filled 2023-02-28: qty 240

## 2023-02-28 MED ORDER — FUROSEMIDE 10 MG/ML IJ SOLN
60.0000 mg | Freq: Four times a day (QID) | INTRAMUSCULAR | Status: DC
  Administered 2023-02-28: 60 mg via INTRAVENOUS
  Filled 2023-02-28: qty 6

## 2023-02-28 MED ORDER — ALBUMIN HUMAN 25 % IV SOLN
25.0000 g | Freq: Four times a day (QID) | INTRAVENOUS | Status: AC
  Administered 2023-02-28 – 2023-03-01 (×4): 25 g via INTRAVENOUS
  Filled 2023-02-28 (×4): qty 100

## 2023-02-28 MED ORDER — STERILE WATER FOR INJECTION IV SOLN
INTRAVENOUS | Status: AC
  Filled 2023-02-28: qty 150
  Filled 2023-02-28: qty 1000

## 2023-02-28 MED ORDER — FENTANYL CITRATE (PF) 2500 MCG/50ML IJ SOLN
25.0000 ug/h | Status: DC
  Administered 2023-02-28: 25 ug/h via INTRAVENOUS
  Administered 2023-03-01: 250 ug/h via INTRAVENOUS
  Filled 2023-02-28 (×2): qty 100

## 2023-02-28 NOTE — Progress Notes (Signed)
Pt continues to have episodes of desaturation.  On 100% o2, pt had o2 saturation of 93% after having o2 saturations in the mid 80s on 70% o2.  Pt was lavaged and suctioned for a moderate amount of thick tan secretions and then recruitment maneuvers were performed x 2 minutes.  Pt o2 saturation improved to 100% and fio2 was decreased to .85.  Will continue to monitor and titrate fio2 when possible.

## 2023-02-28 NOTE — Progress Notes (Signed)
NAME:  ZAKARIYYA CHILSON, MRN:  [id number], DOB:  1946/09/15, LOS: 4 ADMISSION DATE:  02/23/2023, CONSULTATION DATE:  02/20/2023 REFERRING MD: Lorrin Goodell - Neuro CHIEF COMPLAINT: Acute stroke s/p thrombectomy  History of Present Illness:   77 year old man who initially presented to [facility] 2/28 for 2-3 days of weakness. S/p fall at home 2 days PTA. Transferred to [facility] 2/28PM for mechanical thrombectomy. Also with hyperkalemia in the setting of AKI and DKA. PMHx significant for HTN, HLD, CAD (s/p CABG x 3 2017), HFpEF (Echo 01/2023 with EF 50-55%), AF (on Eliquis), CVA (R pontine stroke, cerebellar stroke), T2DM, BPH, depression.  At [facility], patient was noted to have difficulty answering questions and word-finding. Patient's son reported a fall at home 2-3 days PTA that has limited patient's mobility due to pain; he felt there was a possibility patient had not been taking his medications. At baseline, patient lives alone and is independent with all ADLs. Labs at [facility] were notable for pH 7.32, pCO2 33, CBG 787. WBC 17.6, Hgb 6.6. Na 122, K 6.3, bicarb 16 (AG 14), BUN/Cr 70/2.97. BNP 685, Trop 2280. B-hydroxybutyrate 3.77, LA 2.7. UA +glucose, ketones, protein. C/f DKA and NSTEMI, insulin gtt started and Cards consulted with recommendation to defer [facility] in the setting of Hgb drift, ?bleed. Code Stroke was called for AMS and CT Head NAICA, consistent with prior infarcts. CTA Head/Neck showed L PCA (P1 P2) occlusions, L carotid plaque (70% stenosis). Decision for transfer to [facility] for emergent mechanical thrombectomy.  Underwent endovascular revascularization of high-grade L vertebral junction with balloon angioplasty (patency 70%) with NIR (Deveshwar) 2/28PM; post-procedure CT without ICH. Repeat CT Head completed 2/29 as patient was slow to wake from sedation, NAICA/stable. CT A/P was completed 2/29AM in the setting of Hgb drift with unknown cause, demonstrating moderate R renal subcapsular hematoma with extension  of hemorrhage into the posterior pararenal space and rib fractures (R 11th and 12th ribs); suspect this is related to recent fall at home PTA.  Remains critically in in 4N Neuro ICU.  Pertinent Medical History:   Past Medical History:  Diagnosis Date   Atrial fibrillation ([place])    a.) CHA2DS2VASc = 6 (age x 2,  HTN, CVA/TIA x 2, T2DM);  b.) s/p DCCV 09/25/2020 (120 J x1); c.) rate/rhythm maintained on oral metoprolol tartrate; chronically anticoagulated with apixaban   BPH (benign prostatic hyperplasia)    CAD (coronary artery disease) 10/28/2016   a.) LHC 10/28/2016: 95% dLAD, 90% p-mLCx, 75% mLAD, 60% m-dLAD, 99% p-mRCA, 75% dRCA --> consult CVTS; b.) 3v CABG at [facility] 11/05/2016   Carotid artery disease ([place]) 06/29/2016   a.) carotid US 06/29/2016: <50% BICA   Cerebellar stroke ([place]) 11/13/2015   a.) CT imaging 11/13/2015 revealed old/remote LEFT cerebellar and LEFT carona radiata infarcts   CHF (congestive heart failure) (HCC)    Depression    Diabetic foot ulcers (HCC)    Diastolic dysfunction [id number]   a.) TTE 06/28/2016: EF 55-60%, G1DD; b.) TTE 03/19/2020: EF 55-60%, mild BAE, mild-mod MR/TR.   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 Found to be in DKA with c/f recurrent CVA (L PCA?). Also with NSTEMI, elevated trop. Taken to [facility] for ?thrombectomy, underwent endovascular revasc/balloon angioplasty of L vertebral/PCA, no stent. 2/29 - Slow to wake up post-sedation wean, repeat CT Head NAICA. Hgb 6.6, CT A/P with renal subcapsular hematoma/extension into posterior RP space, rib fx on R (11/12). Hgb 7.0, 2U PRBCs ordered. Remains intubated, sedated on insulin gtt. TF started. 3/01 - Sedation adjusted overnight to Versed/Fentanyl in the setting of elevated TG (Prop discontinued). Versed discontinued and Precedex initiated. Cleviprex transitioned to Cardene. Insulin gtt weaned off to SQ insulin. Hgb stable 10.0. Cr worse, US Renal with doppler pending. MRI/MRA Brain pending.  Interim History / Subjective:  No significant events overnight Hgb remains stable Less agitation overnight on fentanyl, versed and precedex Reviewed imaging with vascular surgery, no surgical intervention available for subcapsular bleed. Concern for atrophic left kidney.  Objective:  Blood pressure (!) 110/59, pulse 68, temperature 98.4 F (36.9 C), temperature source Axillary, resp. rate 18, weight 118.2 kg, SpO2 94 %.    Vent Mode: PRVC FiO2 (%):  [50 %-80 %] 70 % Set Rate:  [18 bmp] 18 bmp Vt Set:  [620 mL] 620 mL PEEP:  [10 cmH20] 10 cmH20 Plateau Pressure:  [24 cmH20-28 cmH20] 28 cmH20   Intake/Output Summary (Last 24 hours) at 02/28/2023 0728 Last data filed at 02/28/2023 0600 Gross per 24 hour  Intake 3467.66 ml  Output 815 ml  Net 2652.66 ml   Filed Weights   02/25/23 0530 02/26/23 0702 02/27/23 0500  Weight: 113.7 kg 119.7 kg 118.2 kg   Physical Examination: General: Acutely ill-appearing elderly man in NAD. Intubated, sedated, obese HEENT: Long/AT, anicteric sclera, PERRL 41m, moist mucous membranes. ETT/OGT in place. Neuro: Sedated.  Not following commands.  +Corneal, +Cough, and +Gag  CV: RRR, no m/g/r. PULM: Breathing even and  unlabored on vent. Course breath sounds. GI: Obese, soft, nontender, mild-moderate distention. Hypoactive bowel sounds. Extremities: Bilateral symmetric 1+ LE edema noted. Amputations of toes of R foot (great toe + 2-4th digit), 5th digit intact with mild nail overgrowth/ulceration - no fluctuance. Skin: Warm/dry, no rashes.  Resolved Hospital Problem List:     Assessment & Plan:  Acute PCA stroke s/p endovascular revascularization/balloon angioplasty, no stent - Neuro/Stroke following, appreciate recommendations - NIR following post-procedure - Goal SBP liberalized to < 180 now >24H post - Cleviprex/Cardene weaned off - Wean sedation as able for neuro exam post-MRI - Frequent neuro checks - Neuroprotective measures: HOB > 30 degrees, normoglycemia, normothermia, electrolytes WNL - PT/OT/SLP when able to participate in care  Acute hypoxemic respiratory failure, need for mechanical ventilation in the setting of AMS - Continue full vent support (4-8cc/kg IBW) - Wean FiO2 for O2 sat > 90% - Daily WUA/SBT once appropriate from a mental status standpoint - VAP bundle - Pulmonary hygiene - PAD protocol for sedation: Precedex, Fentanyl, and Versed for goal RASS -1 to -2 - Follow CXR to evaluate for volume overload.  DKA T2DM - AG closed - Insulin gtt weaned off 3/1, transition to SQ insulin - Semglee  40U daily (home dose 100U) - TF coverage 4U Q4H - SSI - CBGs Q4H, goal 140-180  Elevated troponin CAD s/p CABG x 3 (2017) Acute on chronic diastolic heart failure Paroxysmal atrial fibrillation - Cardiology consulted, appreciate recommendations - Troponin elevation likely demand-related, cannot r/o ACS given cardiac history - Echo with EF 50-55%, +RWMAs, mid concentric LVH - Continue statin - Hold ASA - Hold Eliquis in the setting of subcapsular hematoma/RP bleed, will need to revisit AC once more stable - Cardiac monitoring - Optimize lytes as able (K > 4, Mg > 2)  Oliguric  AKI in setting of stage II CKD, also with subcapsular hematoma, ?Page kidney physiology on right Possible atrophic left kidney Hyperkalemia, improved Some concern for Page kidney physiology, given known R subcapsular hematoma and initially difficult-to-control hypertension despite Cleviprex. - Trend BMP, Cr uptrending - Replete electrolytes as indicated - Monitor I&Os - Avoid nephrotoxic agents as able - Ensure adequate renal perfusion - US Renal + doppler to assess flow into kidney - D/w Nephro 3/1, recommend Albumin 25g x 3 and Lasix challenge. Will challenge again today and add bicarb drip at 25m/hr - D/w IR, even if Page kidney - unable to intervene due to typical viscosity of blood products. Discussed with vascular surgery as well, no option for surgical intervention.   Leukocytosis, likely reactive in the setting of multiple insults ?Punctate foci of gas near bleed - Trend WBC, fever curve - Continue ceftriaxone, Flagyl, vanc (MRSA screen +) - Continue to monitor  Thrombocytopenia - monitor - check DIC panel  ABLA in the setting of subcapsular hematoma/RP bleed [facility] - Trend H&H - Monitor for signs of active bleeding - Transfuse for Hgb < 8.0 (given cardiac history) or hemodynamically significant bleeding - Eliquis on hold - If H&H drifting, low threshold to repeat abdominal imaging  Right 11th and 12th rib fractures - Supportive care/pain control  Concern for infected fifth toe, right foot - x-ray without soft tissue involvement - continue to monitor via physical exam  Constipation - received relistor 3/2 - continue miralax and docusate BID - give SMOG enema today - Start senekot qHS  Best Practice: (right click and "Reselect all SmartList Selections" daily)   Diet/type: tubefeeds DVT prophylaxis: SCD GI prophylaxis: PPI Lines: N/A Foley:  Yes, and it is still needed Code Status:  full code Last date of  multidisciplinary goals of care discussion [3/1 - D/w daughter, DArrie Aran at bedside - remains full code at present.]  Critical care time:    The patient is critically ill with multiple organ system failure and requires high complexity decision making for assessment and support, frequent evaluation and titration of therapies, advanced monitoring, review of radiographic studies and interpretation of complex data.   Critical Care Time devoted to patient care services, exclusive of separately billable procedures, described in this note is 40 minutes.  JFreda Jackson MD [facility] Office: [phone]  See Amion for personal pager PCCM on call pager ([phone] 7pm. Please call Elink 7p-7a. [phone]

## 2023-02-28 NOTE — Progress Notes (Signed)
PT Cancellation Note  Patient Details Name: ANGELLO DURRER MRN: [id number] DOB: [birth year]   Cancelled Treatment:    Reason Eval/Treat Not Completed: Medical issues which prohibited therapy remain today. Per discussion with RN, and given continued medical instability, will sign off and await new PT consult as appropriate.   West Carbo, PT, DPT   Acute Rehabilitation Department Office [phone] Secure Chat Communication Preferred  Sandra Cockayne 02/28/2023, 12:10 PM

## 2023-02-28 NOTE — Progress Notes (Signed)
Pharmacy Antibiotic Note  [name] is a 77 y.o. male admitted on 02/24/2023 with  intra-abdominal infection .  Pharmacy has been consulted for vancomycin dosing.Gas seen on CT abdomen, concern for infection. WBC stable, afebrile. Patient with significant AKI with creatinine continuing to rise (baseline ~1.3-1.4)  Received vancomycin '2000mg'$  IV x1 on 3/1 - VR 21. Scr up to 4.73 today  Plan: No additional vancomycin dose today Monitor Scr and check additional vancomycin levels as needed Redose vancomycin when VR < 20 F/u plans for renal replacement therapy  Weight: 118.2 kg (260 lb 9.3 oz)  Temp (24hrs), Avg:97.8 F (36.6 C), Min:97.2 F (36.2 C), Max:98.4 F (36.9 C)  Recent Labs  Lab 02/24/23 1201 02/24/23 1421 02/24/23 1806 02/24/23 2320 02/25/23 0213 02/25/23 0537 02/26/23 0433 02/26/23 0910 02/27/23 0304 02/28/23 0333 02/28/23 0357  WBC  --   --   --  15.8*  --  17.8* 12.4*  --  7.8 7.5  --   CREATININE  --  2.96*   < > 2.77*  --  2.66* 3.37*  --  4.46* 4.73*  --   LATICACIDVEN 2.7* 2.0*  --  1.8 1.2  --   --   --   --   --   --   VANCORANDOM  --   --   --   --   --   --   --  12  --   --  21   < > = values in this interval not displayed.     Estimated Creatinine Clearance: 17.1 mL/min (A) (by C-G formula based on SCr of 4.73 mg/dL (H)).    No Known Allergies  Antimicrobials this admission:  Vancomycin 2/28 >>  Ceftriaxone 3/1 >>  Flagyl 3/1 >> Cefepime x1 2/28  Dose adjustments this admission:   Microbiology results:  2/28 BCx: ngtd 2/28 MRSA PCR: detected   Thank you for allowing pharmacy to be a part of this patient's care.  Dimple Nanas, PharmD, BCPS 02/28/2023 8:35 AM

## 2023-02-28 NOTE — Progress Notes (Signed)
OT Cancellation Note  Patient Details Name: [name] MRN: [id number] DOB: [birth year]   Cancelled Treatment:    Reason Eval/Treat Not Completed: Medical issues which prohibited therapy (Will sign off per RN. Sedated and vent.)  [place], OTR/L Acute Rehab Office: [phone] 02/28/2023, 12:17 PM

## 2023-02-28 NOTE — Progress Notes (Signed)
STROKE TEAM PROGRESS NOTE   INTERVAL HISTORY His daughter is at the bedside. He remains intubated on Versed, fentanyl, precedex. Hb stable. Cre continue to be worsening. CCM will try lasix and bicarb today, if not effective, will need to consider CRRT. EEG yesterday showed no seizure.    Vitals:   02/28/23 1000 02/28/23 1011 02/28/23 1100 02/28/23 1200  BP: (!) 120/59  128/71 123/75  Pulse: 70  69 67  Resp: 17 (!) '21 15 19  '$ Temp:    97.8 F (36.6 C)  TempSrc:    Axillary  SpO2: 94%  100% 100%  Weight:       CBC:  Recent Labs  Lab 02/25/23 0537 02/25/23 1333 02/27/23 0304 02/27/23 1033 02/27/23 1344 02/28/23 0333  WBC 17.8*   < > 7.8  --   --  7.5  NEUTROABS 15.4*  --   --   --   --  5.7  HGB 7.0*   < > 8.5* 8.2*  --  8.3*  HCT 19.8*   < > 25.6* 24.0*  --  25.6*  MCV 87.2   < > 88.3  --   --  90.5  PLT 171   < > 138*  --  145* 152   < > = values in this interval not displayed.   Basic Metabolic Panel:  Recent Labs  Lab 02/26/23 1809 02/27/23 0304 02/27/23 1033 02/28/23 0333  NA  --  133* 135 135  K  --  4.8 4.8 4.9  CL  --  104  --  104  CO2  --  19*  --  17*  GLUCOSE  --  115*  --  149*  BUN  --  76*  --  91*  CREATININE  --  4.46*  --  4.73*  CALCIUM  --  7.4*  --  7.6*  MG 2.6* 2.9*  --   --   PHOS 6.8* 7.1*  --   --    Lipid Panel:  Recent Labs  Lab 02/25/23 0537 02/26/23 0433  CHOL 126  --   TRIG 337* 666*  HDL 29*  --   CHOLHDL 4.3  --   VLDL 67*  --   LDLCALC 30  --    HgbA1c:  Recent Labs  Lab 02/25/23 0537  HGBA1C 8.6*   Urine Drug Screen:  Recent Labs  Lab 02/11/2023 1806  LABOPIA NONE DETECTED  COCAINSCRNUR NONE DETECTED  LABBENZ NONE DETECTED  AMPHETMU NONE DETECTED  THCU NONE DETECTED  LABBARB NONE DETECTED    Alcohol Level  Recent Labs  Lab 02/17/2023 1806  ETH <10    IMAGING past 24 hours DG CHEST PORT 1 VIEW  Result Date: 02/28/2023 CLINICAL DATA:  Respiratory failure EXAM: PORTABLE CHEST 1 VIEW COMPARISON:   02/25/2023 FINDINGS: No significant change in rotated AP portable chest radiograph. Support apparatus including endotracheal tube and esophagogastric tube appropriately positioned. 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 Layering right pleural effusion with mild diffuse interstitial opacity and no new or focal airspace opacity. Osseous structures unremarkable. IMPRESSION: 1. No significant change in rotated AP portable chest radiograph. 2. Layering right pleural effusion with mild diffuse interstitial opacity and no new or focal airspace opacity. 3. Cardiomegaly. 4. Support apparatus appropriately positioned. Electronically Signed   By: Delanna Ahmadi M.D.   On: 02/28/2023 09:11   EEG adult  Result Date: 02/27/2023 Greta Doom, MD     02/27/2023  1:40 PM History: 77 year old male with posterior  circulation stroke Sedation: Versed and fentanyl Technique: This EEG was acquired with electrodes placed according to the International 10-20 electrode system (including Fp1, Fp2, F3, F4, C3, C4, P3, P4, O1, O2, T3, T4, T5, T6, A1, A2, Fz, Cz, Pz). The following electrodes were missing or displaced: none. Background: The background consists of low voltage irregular slow activity with occasional brief runs of theta range activity which are centrally predominant.  There is no epileptiform discharges or posterior dominant rhythm seen. Photic stimulation: Physiologic driving is now performed EEG Abnormalities: 1) generalized irregular slow activity 2) absent posterior dominant rhythm Clinical Interpretation: This EEG is consistent with a generalized nonspecific cerebral dysfunction(encephalopathy), as can be seen with sedating medications among other causes. There was no seizure or seizure predisposition recorded on this study. Please note that lack of epileptiform activity on EEG does not preclude the possibility of epilepsy. Roland Rack, MD [facility] [phone] If 7pm- 7am, please  page neurology on call as listed in [place].    PHYSICAL EXAM  Temp:  [97.2 F (36.2 C)-98.4 F (36.9 C)] 97.8 F (36.6 C) (03/03 1200) Pulse Rate:  [56-95] 67 (03/03 1200) Resp:  [7-21] 19 (03/03 1200) BP: (110-139)/(55-75) 123/75 (03/03 1200) SpO2:  [87 %-100 %] 100 % (03/03 1200) Arterial Line BP: (113-192)/(39-77) 164/77 (03/03 1200) FiO2 (%):  [50 %-80 %] 80 % (03/03 1011)  General - Well nourished, well developed, intubated on sedation.  Ophthalmologic - fundi not visualized due to noncooperation.  Cardiovascular - Regular rate and rhythm, not in afib.  Neuro - intubated on sedation, eyes closed, not following commands. With forced eye opening, eyes in mid position, not blinking to visual threat, doll's eyes sluggish, not tracking, PERRL. Corneal reflex present, gag and cough present. Breathing over the vent.  Facial symmetry not able to test due to ET tube.  Tongue protrusion not cooperative. On pain stimulation, BUE mild withdraw. Sensation, coordination and gait not tested.  With pain stimulation, pt eyes open spontaneously and kept open, but not tracking, not eye movement, bilateral pupil dilated, but still responsive to light, b/l UE flexion posturing intermittently but not rhythmic, no movement b/l LEs. O2 sat down to 80s.    ASSESSMENT/PLAN [name] is a 77 y.o. male with history of uncontrolled type 2 diabetes, atrial fibrillation on Eliquis, CAD s/p CABG, CVA, hypertension, hyperlipidemia, HFpEF, restless leg syndrome who presents to the ED 2/2 weakness and hyperglycemia was found to be aphasic  Stroke:  Acute cerebellar infarct L>R and punctate right PCA infarct s/p balloon angioplasty of left VBJ, etiology: Diffuse multivessel severe intracranial stenosis  CT head No acute abnormality.  Old left BG/CR lacunar infarcts CTA head & neck Multifocal short-segment occlusions of the left P1 and P2 segments with diminutive distal reconstitution.   CTP - 20 cc  ischemic penumbra in the left occipital lobe  Cerebral angio Severe preocclusive stenosis of the left vertebral basilar junction at the origin of the left posterior inferior cerebellar artery. High-grade stenosis of the left P2 segment, in the P3 segment. S/p balloon angioplasty of left VBJ Repeat CT head No acute intracranial abnormality.  MRI extensive scattered cerebellar infarcts, L>R. Punctate left PCA infarcts.  MRA - Advanced intracranial atherosclerotic disease. Most notable findings include severe tandem left P2 stenoses with severely attenuated flow distally. Additional mild to moderate multifocal narrowing about the carotid siphons, right M1 segment, and distal MCA branches  2D Echo EF 50-55% hypertrophy  LDL 30 HgbA1c 11.0 VTE prophylaxis - SCD's  Eliquis  prior to admission, now on no antithrombotics. Will restart antithrombotics when bleeding stable and anemia improves. Therapy recommendations:  pending Disposition:  pending  SIRPIDs  With pain stimulation, pt eyes wide open, but not blinking or tracking, no eye movement, bilateral pupil dilated, but still responsive to light, b/l UE flexion posturing intermittently but not rhythmic, no movement b/l LEs. O2 sat down to 80s.  EEG no seizure, consistent with encephalopathy  CAD s/p CABG x 3 (2017) Acute on chronic diastolic heart failure PAF on eliquis Eliquis on hold due to RP bleed Cardiology following  Will consider antithrombotics once apporopriate  Hx of hypertension Hypertensive Urgency -> hypotension Home meds:  norvasc, losartan, metoprolol,  Stable now Off Cleviprex gtt or pressors Long-term BP goal normotensive  Hyperlipidemia Home meds:  zocor '80mg'$  LDL 30, goal < 70 decrease zocor to 40 Continue statin at discharge  Hx of stroke 06/2016 right pontine infarct on MRI. CUS neg, EF 55-60%, A1C 13.3, elevated TG. Discharged with plavix  Diabetes type II Uncontrolled DKA Home meds:  insulin, metformin,  semaglutide HgbA1c 8.6, goal < 7.0 Currently off insulin gtt -> on insulin subq CBGs SSI Will need close outpatient follow   Acute hypoxic respiratory failure  Leukocytosis  Intubated - management by CCM On Versed and fentanyl and Precedex CXR as needed  WBC 17.6-12.4-7.8-7.5 On rocephin  Acute blood loss anemia Retroperitoneal bleed Hgb 5.1->7.0->10.0 ->8.2->8.3 transfused blood 2/29 CT abdomen pelvis 2/29 moderate right renal subcapsular hematoma with moderate distention of hemorrhage into the posterior pararenal space Hold off antithrombotics Management per primary team  AKI Cre 2.70->2.66-> 2.96-> 3.37-> 4.46->4.73 On IVF and TF CCM on board On lasix and bicarb today Consider CRRT if not effective  Dysphagia  OGT placed.  On TF @ 60 and IVF Speech on board  Other Stroke Risk Factors Advanced Age >/= 4  Obesity, Body mass index is 35.34 kg/m., BMI >/= 30 associated with increased stroke risk, recommend weight loss, diet and exercise as appropriate   Other Active Problems Hyponatremia Na 134->132->135->135 Rib fracture - right 11th and 12th rib fracture on [name] day # 4  This patient is critically ill due to stroke, intracranial vascular stenosis, severe anemia, retroperitoneal hemorrhage, AKI, respiratory failure and at significant risk of neurological worsening, death form recurrent stroke, hemorrhagic transformation, hypovolemic shock, sepsis, heart failure, renal failure. This patient's care requires constant monitoring of vital signs, hemodynamics, respiratory and cardiac monitoring, review of multiple databases, neurological assessment, discussion with family, other specialists and medical decision making of high complexity. I spent 45 minutes of neurocritical care time in the care of this patient. I had long discussion with daughter at bedside, updated pt current condition, treatment plan and potential prognosis, and answered all the questions.  She  expressed understanding and appreciation.  I also discussed with CCM Dr. Erin Fulling.  Rosalin Hawking, MD PhD Stroke Neurology 02/28/2023 1:16 PM   To contact Stroke Continuity provider, please refer to [email or web address] After hours, contact General Neurology

## 2023-03-01 DIAGNOSIS — N182 Chronic kidney disease, stage 2 (mild): Secondary | ICD-10-CM

## 2023-03-01 LAB — CULTURE, BLOOD (ROUTINE X 2)
Culture: NO GROWTH
Culture: NO GROWTH
Culture: NO GROWTH
Culture: NO GROWTH
Special Requests: ADEQUATE
Special Requests: ADEQUATE

## 2023-03-01 LAB — CBC WITH DIFFERENTIAL/PLATELET
Abs Immature Granulocytes: 0.36 10*3/uL — ABNORMAL HIGH (ref 0.00–0.07)
Basophils Absolute: 0 10*3/uL (ref 0.0–0.1)
Basophils Relative: 0 %
Eosinophils Absolute: 0.2 10*3/uL (ref 0.0–0.5)
Eosinophils Relative: 3 %
HCT: 25.7 % — ABNORMAL LOW (ref 39.0–52.0)
Hemoglobin: 8.1 g/dL — ABNORMAL LOW (ref 13.0–17.0)
Immature Granulocytes: 5 %
Lymphocytes Relative: 7 %
Lymphs Abs: 0.5 10*3/uL — ABNORMAL LOW (ref 0.7–4.0)
MCH: 29.3 pg (ref 26.0–34.0)
MCHC: 31.5 g/dL (ref 30.0–36.0)
MCV: 93.1 fL (ref 80.0–100.0)
Monocytes Absolute: 0.7 10*3/uL (ref 0.1–1.0)
Monocytes Relative: 9 %
Neutro Abs: 6.2 10*3/uL (ref 1.7–7.7)
Neutrophils Relative %: 76 %
Platelets: 174 10*3/uL (ref 150–400)
RBC: 2.76 MIL/uL — ABNORMAL LOW (ref 4.22–5.81)
RDW: 18.7 % — ABNORMAL HIGH (ref 11.5–15.5)
WBC: 8 10*3/uL (ref 4.0–10.5)
nRBC: 0 % (ref 0.0–0.2)

## 2023-03-01 LAB — BASIC METABOLIC PANEL
Anion gap: 13 (ref 5–15)
BUN: 116 mg/dL — ABNORMAL HIGH (ref 8–23)
CO2: 18 mmol/L — ABNORMAL LOW (ref 22–32)
Calcium: 7.5 mg/dL — ABNORMAL LOW (ref 8.9–10.3)
Chloride: 104 mmol/L (ref 98–111)
Creatinine, Ser: 5.36 mg/dL — ABNORMAL HIGH (ref 0.61–1.24)
GFR, Estimated: 10 mL/min — ABNORMAL LOW (ref >60)
Glucose, Bld: 166 mg/dL — ABNORMAL HIGH (ref 70–99)
Potassium: 4.7 mmol/L (ref 3.5–5.1)
Sodium: 135 mmol/L (ref 135–145)

## 2023-03-01 LAB — GLUCOSE, CAPILLARY
Glucose-Capillary: 157 mg/dL — ABNORMAL HIGH (ref 70–99)
Glucose-Capillary: 180 mg/dL — ABNORMAL HIGH (ref 70–99)
Glucose-Capillary: 165 mg/dL — ABNORMAL HIGH (ref 70–99)

## 2023-03-01 LAB — TRIGLYCERIDES: Triglycerides: 169 mg/dL — ABNORMAL HIGH (ref <150)

## 2023-03-01 LAB — MAGNESIUM: Magnesium: 2.8 mg/dL — ABNORMAL HIGH (ref 1.7–2.4)

## 2023-03-01 LAB — PHOSPHORUS: Phosphorus: 9.2 mg/dL — ABNORMAL HIGH (ref 2.5–4.6)

## 2023-03-01 MED ORDER — HALOPERIDOL LACTATE 5 MG/ML IJ SOLN: 2.5000 mg | INTRAMUSCULAR | Status: DC | PRN

## 2023-03-01 MED ORDER — VANCOMYCIN HCL 1250 MG/250ML IV SOLN
1250.0000 mg | Freq: Once | INTRAVENOUS | Status: DC
  Filled 2023-03-01: qty 250

## 2023-03-01 MED ORDER — POLYVINYL ALCOHOL 1.4 % OP SOLN: 1.0000 [drp] | Freq: Four times a day (QID) | OPHTHALMIC | Status: DC | PRN

## 2023-03-01 MED ORDER — ONDANSETRON 4 MG PO TBDP: 4.0000 mg | ORAL_TABLET | Freq: Four times a day (QID) | ORAL | Status: DC | PRN

## 2023-03-01 MED ORDER — ACETAMINOPHEN 325 MG PO TABS: 650.0000 mg | ORAL_TABLET | Freq: Four times a day (QID) | ORAL | Status: DC | PRN

## 2023-03-01 MED ORDER — FENTANYL 2500MCG IN NS 250ML (10MCG/ML) PREMIX INFUSION
0.0000 ug/h | INTRAVENOUS | Status: DC
  Filled 2023-03-01: qty 250

## 2023-03-01 MED ORDER — GLYCOPYRROLATE 0.2 MG/ML IJ SOLN: 0.2000 mg | INTRAMUSCULAR | Status: DC | PRN

## 2023-03-01 MED ORDER — GLYCOPYRROLATE 1 MG PO TABS: 1.0000 mg | ORAL_TABLET | ORAL | Status: DC | PRN

## 2023-03-01 MED ORDER — ACETAMINOPHEN 650 MG RE SUPP: 650.0000 mg | Freq: Four times a day (QID) | RECTAL | Status: DC | PRN

## 2023-03-01 MED ORDER — MIDAZOLAM BOLUS VIA INFUSION (WITHDRAWAL LIFE SUSTAINING TX): 2.0000 mg | INTRAVENOUS | Status: DC | PRN

## 2023-03-01 MED ORDER — SODIUM CHLORIDE 0.9 % IV SOLN: INTRAVENOUS | Status: DC

## 2023-03-01 MED ORDER — ONDANSETRON HCL 4 MG/2ML IJ SOLN: 4.0000 mg | Freq: Four times a day (QID) | INTRAMUSCULAR | Status: DC | PRN

## 2023-03-01 MED ORDER — MIDAZOLAM-SODIUM CHLORIDE 100-0.9 MG/100ML-% IV SOLN
0.0000 mg/h | INTRAVENOUS | Status: DC
  Administered 2023-03-01: 5 mg/h via INTRAVENOUS

## 2023-03-01 MED ORDER — MIDAZOLAM HCL 2 MG/2ML IJ SOLN: 1.0000 mg | INTRAMUSCULAR | Status: DC | PRN

## 2023-03-01 MED ORDER — FENTANYL BOLUS VIA INFUSION: 100.0000 ug | INTRAVENOUS | Status: DC | PRN

## 2023-03-01 MED ORDER — GLYCOPYRROLATE 0.2 MG/ML IJ SOLN
0.4000 mg | Freq: Three times a day (TID) | INTRAMUSCULAR | Status: DC
  Administered 2023-03-01: 0.4 mg via INTRAVENOUS
  Filled 2023-03-01: qty 2

## 2023-03-29 NOTE — Progress Notes (Signed)
   03/23/2023 1200  Spiritual Encounters  Type of Visit Initial  Care provided to: Pt and family  Conversation partners present during encounter Nurse  Reason for visit Urgent spiritual support  OnCall Visit No  Spiritual Framework  Presenting Themes Values and beliefs;Significant life change;Impactful experiences and emotions;Rituals and practive  Values/beliefs Prayers for peace  Patient Stress Factors Health changes  Family Stress Factors Major life changes  Interventions  Spiritual Care Interventions Made Established relationship of care and support;Compassionate presence;Reflective listening;Prayer  Intervention Outcomes  Outcomes Connection to spiritual care;Awareness of support  Spiritual Care Plan  Spiritual Care Issues Still Outstanding Chaplain will continue to follow   Chaplain Tanishka Drolet responded to a page from [place]. Patient's family decided to make patient comfort care. Family requested a chaplain to pray for peace and comfort for the patient. Chaplain Kenton Kingfisher will follow up if family requests.   Note Prepared by Abbott Pao, Resident Chaplain [phone].

## 2023-03-29 NOTE — Death Summary Note (Signed)
DEATH SUMMARY   Patient Details  Name: [name] MRN: [id number] DOB: 1946/07/29  Admission/Discharge Information   Admit Date:  2023-03-02  Date of Death: Date of Death: 03-07-2023  Time of Death: Time of Death: Mar 13, 1409  Length of Stay: 5  Referring Physician: [name], MD   Reason(s) for Hospitalization  Acute PCA Stroke  Diagnoses  Preliminary cause of death:  Stroke  Secondary Diagnoses (including complications and co-morbidities):  Principal Problem:   Stroke [facility]) Active Problems:   Non-ST elevation (NSTEMI) myocardial infarction ([place])   Vertebrobasilar artery stenosis   Altered mental status   CKD (chronic kidney disease), symptom management only, stage 2 (mild) Acute Hypoxemic Respiratory Failure Acute Renal Failure Volume Overload DKA Diabetes Mellitus Type II Coronary Artery Disease Paroxysmal Atrial Fibrillation Acute Blood Loss Anemia Right Renal Subcapsular Hematoma Retroperitoneal Bleed Right 2023/03/14 and 12th rib fractures Constipation  Brief Hospital Course (including significant findings, care, treatment, and services provided and events leading to death)  ALICIA SALASAR is a 77 y.o. year old male with DMII, CAD, CVA, HLD, HFpEF, atrial fibrillation who was admitted for acute PCA stroke s/p endovascular revascularization with balloon angioplasty and DKA. He required intubation and mechanical ventilatory support initially for airway protection but later developed volume overload in setting in acute renal failure with oliguria. His course was complicated with acute blood loss anemia due to retroperitoneal bleed and right renal subcapsular bleed. He received transfusions. Due to volume overload he developed right pleural effusion and pulmonary vascular congestion on chest imaging. Discussions held with patient's son and daughter regarding neurologic prognosis and renal failure. Due to poor neurologic prognosis and need for living in a  nursing home, patient's family decided on transition to comfort care. Patient passed away at 1410 on 2023-03-07.  Pertinent Labs and Studies  Significant Diagnostic Studies DG CHEST PORT 1 VIEW  Result Date: 02/28/2023 CLINICAL DATA:  Respiratory failure EXAM: PORTABLE CHEST 1 VIEW COMPARISON:  02/25/2023 FINDINGS: No significant change in rotated AP portable chest radiograph. Support apparatus including endotracheal tube and esophagogastric tube appropriately positioned. 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 Layering right pleural effusion with mild diffuse interstitial opacity and no new or focal airspace opacity. Osseous structures unremarkable. IMPRESSION: 1. No significant change in rotated AP portable chest radiograph. 2. Layering right pleural effusion with mild diffuse interstitial opacity and no new or focal airspace opacity. 3. Cardiomegaly. 4. Support apparatus appropriately positioned. Electronically Signed   By: Delanna Ahmadi M.D.   On: 02/28/2023 09:11   VAS US RENAL ARTERY DUPLEX  Result Date: 02/27/2023 ABDOMINAL VISCERAL Patient Name:  VALDEZ SHOTTS  Date of Exam:   02/27/2023 Medical Rec #: [id number]              Accession #:    [id number] Date of Birth: 27-Apr-1946              Patient Gender: M Patient Age:   51 years Exam Location:  [facility] Procedure:      VAS US RENAL ARTERY DUPLEX Referring Phys: [id number] STEPHANIE M REESE -------------------------------------------------------------------------------- High Risk Factors: Hypertension, Diabetes, past history of smoking, prior MI,                    coronary artery disease, prior CVA. Other Factors: CABG, CHF, Afib, CKD3. Limitations: VERY limited exam due to obesity, patient discomfort, air/bowel gas and poor tissue/ultrasound interface. Comparison Study: No previous Performing Technologist: Rogelia Rohrer RVT, RDMS  Examination  Guidelines: A complete evaluation includes B-mode imaging, spectral Doppler, color  Doppler, and power Doppler as needed of all accessible portions of each vessel. Bilateral testing is considered an integral part of a complete examination. Limited examinations for reoccurring indications may be performed as noted.  Duplex Findings: +--------------------+--------+--------+------+-------------------+ Mesenteric          PSV cm/sEDV cm/sPlaque     Comments       +--------------------+--------+--------+------+-------------------+ Aorta Mid              63      0          at level of kidneys +--------------------+--------+--------+------+-------------------+ Celiac Artery Origin                        not visualized    +--------------------+--------+--------+------+-------------------+ SMA Origin                                  not visualized    +--------------------+--------+--------+------+-------------------+    +------------------+--------+--------+--------------+ Right Renal ArteryPSV cm/sEDV cm/s   Comment     +------------------+--------+--------+--------------+ Origin                            not visualized +------------------+--------+--------+--------------+ Proximal                          not visualized +------------------+--------+--------+--------------+ Mid                               not visualized +------------------+--------+--------+--------------+ Distal                            not visualized +------------------+--------+--------+--------------+ +-----------------+--------+--------+--------------+ Left Renal ArteryPSV cm/sEDV cm/s   Comment     +-----------------+--------+--------+--------------+ Origin                           not visualized +-----------------+--------+--------+--------------+ Proximal            76      9                   +-----------------+--------+--------+--------------+ Mid                 52      7                   +-----------------+--------+--------+--------------+ Distal               66      8                   +-----------------+--------+--------+--------------+ +------------+--------+--------+----+-----------+--------+--------+----+ Right KidneyPSV cm/sEDV cm/sRI  Left KidneyPSV cm/sEDV cm/sRI   +------------+--------+--------+----+-----------+--------+--------+----+ Upper Pole  15      0       1.00Upper Pole                      +------------+--------+--------+----+-----------+--------+--------+----+ Mid         17      0       1.00Mid        12      2       0.80 +------------+--------+--------+----+-----------+--------+--------+----+ Lower Pole  10      0  1.00Lower Pole 12      3       0.79 +------------+--------+--------+----+-----------+--------+--------+----+ Hilar       24      0       1.00Hilar      16      3       0.81 +------------+--------+--------+----+-----------+--------+--------+----+ +------------------+-----+------------------+-----+ Right Kidney           Left Kidney             +------------------+-----+------------------+-----+ RAR                    RAR                     +------------------+-----+------------------+-----+ RAR (manual)           RAR (manual)      1.20  +------------------+-----+------------------+-----+ Cortex                 Cortex            17/3  +------------------+-----+------------------+-----+ Cortex thickness       Corex thickness         +------------------+-----+------------------+-----+ Kidney length (cm)13.66Kidney length (cm)12.30 +------------------+-----+------------------+-----+  Summary: Renal:  Right: Renal artery and renal vein not seen on this exam (see        previously mentioned limitations). Abnormal right Resistive        Index. Left:  Abnormal left Resisitve Index. No evidence of left renal        artery stenosis. LRV flow present. Mesenteric:  Unable to visualize SMA and celiac artery. Aorta poorly visualized, seen only at level of kidney.   *See table(s) above for measurements and observations.  Diagnosing physician: Monica Martinez MD  Electronically signed by Monica Martinez MD on 02/27/2023 at 2:35:04 PM.    Final    EEG adult  Result Date: 02/27/2023 Greta Doom, MD     02/27/2023  1:40 PM History: 77 year old male with posterior circulation stroke Sedation: Versed and fentanyl Technique: This EEG was acquired with electrodes placed according to the International 10-20 electrode system (including Fp1, Fp2, F3, F4, C3, C4, P3, P4, O1, O2, T3, T4, T5, T6, A1, A2, Fz, Cz, Pz). The following electrodes were missing or displaced: none. Background: The background consists of low voltage irregular slow activity with occasional brief runs of theta range activity which are centrally predominant.  There is no epileptiform discharges or posterior dominant rhythm seen. Photic stimulation: Physiologic driving is now performed EEG Abnormalities: 1) generalized irregular slow activity 2) absent posterior dominant rhythm Clinical Interpretation: This EEG is consistent with a generalized nonspecific cerebral dysfunction(encephalopathy), as can be seen with sedating medications among other causes. There was no seizure or seizure predisposition recorded on this study. Please note that lack of epileptiform activity on EEG does not preclude the possibility of epilepsy. Roland Rack, MD [facility] [phone] If 7pm- 7am, please page neurology on call as listed in [place].   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 Mild first metatarsophalangeal arthrosis. Joint spaces are otherwise well preserved. Soft tissues unremarkable. 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 No acute radiographic  findings. Electronically Signed   By: Delanna Ahmadi M.D.   On: 02/27/2023 11:52   IR PERCUTANEOUS ART THROMBECTOMY/INFUSION INTRACRANIAL INC DIAG  ANGIO  Result Date: 02/27/2023 INDICATION: New onset aphasia. CT angiogram of the head and neck demonstrating near occlusive left posterior cerebral artery proximally. EXAM: 1. EMERGENT LARGE VESSEL OCCLUSION THROMBOLYSIS (POSTERIOR CIRCULATION) COMPARISON:  CT angiogram of the head and neck February 24, 2023. MEDICATIONS: Ancef 2 g IV antibiotic was administered within 1 hour of the procedure. ANESTHESIA/SEDATION: General anesthesia. CONTRAST:  Omnipaque 300 120 mL. FLUOROSCOPY TIME:  Fluoroscopy Time: 40 minutes 30 seconds (2412 mGy). COMPLICATIONS: None immediate. TECHNIQUE: Following a full explanation of the procedure along with the potential associated complications, an informed witnessed consent was obtained. The risks of intracranial hemorrhage of 10%, worsening neurological deficit, ventilator dependency, death and inability to revascularize were all reviewed in detail with the patient's daughter. The patient was then put under general anesthesia by the Department of Anesthesiology at [facility]. The right groin was prepped and draped in the usual sterile fashion. Thereafter using modified Seldinger technique, transfemoral access into the right common femoral artery was obtained without difficulty. Over an 0.035 inch guidewire an 8 French 15 cm Pinnacle sheath was inserted. Through this, and also over an 0.035 inch guidewire a combination 5 [place] JB 1 catheter was advanced to the aortic arch region and selectively positioned in the left vertebral artery. FINDINGS: The left vertebral artery origin demonstrates mild stenosis. The vessel is, otherwise, is seen to opacify to the cranial skull base. Stenosis secondary to an eccentric smooth atherosclerotic plaque was evident at the level of the left posterior-inferior cerebellar artery. PROCEDURE: The  diagnostic JB 1 catheter was then advanced using biplane roadmap technique to the distal left vertebral artery over an 035 inch Roadrunner guidewire. Guidewire was removed. Good aspiration was obtained from the hub of the JB 1 catheter. A gentle control arteriogram performed through this demonstrated no evidence of spasms, dissections or of intraluminal filling defects. This was then exchanged to a 160 cm 035 glide exchange guidewire for a 90 cm 8 [place] Neuron Max sheath. The 035 inch guidewire was removed. Good aspiration obtained from the hub of the Neuron Max sheath. A gentle control arteriogram performed through this demonstrated no evidence of spasms, dissections or intraluminal filling defects. A control arteriogram performed centered intracranially demonstrated patency of the left vertebrobasilar junction with high-grade stenosis noted at the origin of the left posterior-inferior cerebellar artery. Multifocal areas of caliber irregularity in the entirety of the PICA suggested intracranial arteriosclerosis. Distal to this the left vertebrobasilar junction demonstrates wide patency with tapered narrowing of the basilar artery at its distal aspect. Patency was demonstrated of the anterior-inferior cerebellar arteries and the superior cerebellar arteries which demonstrated moderate stenosis due to intracranial arteriosclerosis in the proximal 1/3 of the left superior cerebellar artery. No opacification was noted of the right posterior cerebral artery. The left posterior cerebral artery demonstrates severe segmental stenosis in the distal P1 segment extending into the proximal P2 segment. Another focal area of high-grade stenosis was noted of the distal P2 segment. Flow into the distal P3 territory remained unabated with no gross evidence of occlusions. Also noted was retrograde filling of the right vertebrobasilar junction with a high-grade stenosis noted of the distal right vertebrobasilar junction.  ENDOVASCULAR REVASCULARIZATION HIGH-GRADE STENOSIS OF THE LEFT VERTEBROBASILAR JUNCTION WITH BALLOON ANGIOPLASTY Through the 8 French Neuron Max sheath in the distal left vertebral artery over an 014 inch soft tip retrieval micro guidewire, an 160 cm microcatheter was advanced to just proximal to the high-grade stenosis. This was crossed without difficulty with the micro  guidewire followed by the microcatheter with a combination advanced to the mid basilar artery. The guidewire was removed. Good aspiration obtained from the hub of the microcatheter. This was in turn exchanged for a 300 cm 014 inch soft tip Transend EX micro guidewire under constant visualization. A control arteriogram performed demonstrated safe positioning of the tip of the micro guidewire in the intracranial circulation. A 2.75 mm x 15 mm Apex angioplasty microcatheter was then prepped with 75% contrast and heparinized saline infusion antegradely, with heparinized saline infusion retrogradely. This was then advanced using the rapid exchange technique to the stenotic left vertebrobasilar junction. The proximal and distal markers were then positioned adequate distant from the site of severe stenosis. A control inflation was then performed using micro inflation syringe device via micro tubing. With slow intravenous, the balloon was inflated to approximately 2.65 mm where it was maintained for a minute. Thereafter balloon was deflated and retrieved proximally. A control arteriogram performed through the Neuron Max sheath demonstrated significantly improved caliber and flow through the angioplastied segment. There was brisk flow into the distal basilar artery, the superior cerebellar arteries and the left posterior cerebral artery. This also revealed more clearly the multifocal areas of stenosis of the left posterior cerebral artery P1 P2 junction in distal P2 region. Control arteriograms were then performed at 15 and 30 minutes post angioplasty. These  continued to demonstrate wide patency of the angioplastied segment of the left vertebrobasilar junction, with patency maintained in the left posterior-inferior cerebellar artery. A final control arteriogram performed demonstrated excellent caliber of the angioplastied left vertebrobasilar junction with a patency of 70%. Patency of the left posterior-inferior cerebellar artery remained intact. The exchange micro guidewire was then retrieved and removed. A gentle control arteriogram performed just proximal to the origin of the left vertebral artery demonstrated wide patency and brisk flow intracranially. An 8 French Angio-Seal closure device was then deployed at the right groin puncture site. Distal pulses remained Dopplerable in both feet unchanged. A flat panel CT of the brain demonstrated no hemorrhagic complications. 8 mm Integrilin was infused intra-arterially in order to prevent platelet aggregation at the angioplasty site. The patient was left intubated because of his medical comorbidities. He was then transferred to the neuro ICU for post revascularization care. IMPRESSION: Status post endovascular revascularization of high-grade stenosis of the dominant left vertebrobasilar junction of the left posterior-inferior cerebellar artery preprocedural caliber of approximately 70%. Extensive diffuse intracranial arteriosclerotic disease affecting primarily the posterior cerebral arteries, the left superior cerebellar artery and the left posterior-inferior cerebellar artery. PLAN: Follow-up in the clinic 1 month post discharge. Electronically Signed   By: Luanne Bras M.D.   On: 02/27/2023 08:25   IR CT Head Ltd  Result Date: 02/27/2023 INDICATION: New onset aphasia. CT angiogram of the head and neck demonstrating near occlusive left posterior cerebral artery proximally. EXAM: 1. EMERGENT LARGE VESSEL OCCLUSION THROMBOLYSIS (POSTERIOR CIRCULATION) COMPARISON:  CT angiogram of the head and neck February 24, 2023. MEDICATIONS: Ancef 2 g IV antibiotic was administered within 1 hour of the procedure. ANESTHESIA/SEDATION: General anesthesia. CONTRAST:  Omnipaque 300 120 mL. FLUOROSCOPY TIME:  Fluoroscopy Time: 40 minutes 30 seconds (2412 mGy). COMPLICATIONS: None immediate. TECHNIQUE: Following a full explanation of the procedure along with the potential associated complications, an informed witnessed consent was obtained. The risks of intracranial hemorrhage of 10%, worsening neurological deficit, ventilator dependency, death and inability to revascularize were all reviewed in detail with the patient's daughter. The patient was then put under general  anesthesia by the Department of Anesthesiology at [facility]. The right groin was prepped and draped in the usual sterile fashion. Thereafter using modified Seldinger technique, transfemoral access into the right common femoral artery was obtained without difficulty. Over an 0.035 inch guidewire an 8 French 15 cm Pinnacle sheath was inserted. Through this, and also over an 0.035 inch guidewire a combination 5 [place] JB 1 catheter was advanced to the aortic arch region and selectively positioned in the left vertebral artery. FINDINGS: The left vertebral artery origin demonstrates mild stenosis. The vessel is, otherwise, is seen to opacify to the cranial skull base. Stenosis secondary to an eccentric smooth atherosclerotic plaque was evident at the level of the left posterior-inferior cerebellar artery. PROCEDURE: The diagnostic JB 1 catheter was then advanced using biplane roadmap technique to the distal left vertebral artery over an 035 inch Roadrunner guidewire. Guidewire was removed. Good aspiration was obtained from the hub of the JB 1 catheter. A gentle control arteriogram performed through this demonstrated no evidence of spasms, dissections or of intraluminal filling defects. This was then exchanged to a 160 cm 035 glide exchange guidewire for a 90 cm 8 [place]  Neuron Max sheath. The 035 inch guidewire was removed. Good aspiration obtained from the hub of the Neuron Max sheath. A gentle control arteriogram performed through this demonstrated no evidence of spasms, dissections or intraluminal filling defects. A control arteriogram performed centered intracranially demonstrated patency of the left vertebrobasilar junction with high-grade stenosis noted at the origin of the left posterior-inferior cerebellar artery. Multifocal areas of caliber irregularity in the entirety of the PICA suggested intracranial arteriosclerosis. Distal to this the left vertebrobasilar junction demonstrates wide patency with tapered narrowing of the basilar artery at its distal aspect. Patency was demonstrated of the anterior-inferior cerebellar arteries and the superior cerebellar arteries which demonstrated moderate stenosis due to intracranial arteriosclerosis in the proximal 1/3 of the left superior cerebellar artery. No opacification was noted of the right posterior cerebral artery. The left posterior cerebral artery demonstrates severe segmental stenosis in the distal P1 segment extending into the proximal P2 segment. Another focal area of high-grade stenosis was noted of the distal P2 segment. Flow into the distal P3 territory remained unabated with no gross evidence of occlusions. Also noted was retrograde filling of the right vertebrobasilar junction with a high-grade stenosis noted of the distal right vertebrobasilar junction. ENDOVASCULAR REVASCULARIZATION HIGH-GRADE STENOSIS OF THE LEFT VERTEBROBASILAR JUNCTION WITH BALLOON ANGIOPLASTY Through the 8 French Neuron Max sheath in the distal left vertebral artery over an 014 inch soft tip retrieval micro guidewire, an 160 cm microcatheter was advanced to just proximal to the high-grade stenosis. This was crossed without difficulty with the micro guidewire followed by the microcatheter with a combination advanced to the mid basilar artery.  The guidewire was removed. Good aspiration obtained from the hub of the microcatheter. This was in turn exchanged for a 300 cm 014 inch soft tip Transend EX micro guidewire under constant visualization. A control arteriogram performed demonstrated safe positioning of the tip of the micro guidewire in the intracranial circulation. A 2.75 mm x 15 mm Apex angioplasty microcatheter was then prepped with 75% contrast and heparinized saline infusion antegradely, with heparinized saline infusion retrogradely. This was then advanced using the rapid exchange technique to the stenotic left vertebrobasilar junction. The proximal and distal markers were then positioned adequate distant from the site of severe stenosis. A control inflation was then performed using micro inflation syringe device via micro tubing.  With slow intravenous, the balloon was inflated to approximately 2.65 mm where it was maintained for a minute. Thereafter balloon was deflated and retrieved proximally. A control arteriogram performed through the Neuron Max sheath demonstrated significantly improved caliber and flow through the angioplastied segment. There was brisk flow into the distal basilar artery, the superior cerebellar arteries and the left posterior cerebral artery. This also revealed more clearly the multifocal areas of stenosis of the left posterior cerebral artery P1 P2 junction in distal P2 region. Control arteriograms were then performed at 15 and 30 minutes post angioplasty. These continued to demonstrate wide patency of the angioplastied segment of the left vertebrobasilar junction, with patency maintained in the left posterior-inferior cerebellar artery. A final control arteriogram performed demonstrated excellent caliber of the angioplastied left vertebrobasilar junction with a patency of 70%. Patency of the left posterior-inferior cerebellar artery remained intact. The exchange micro guidewire was then retrieved and removed. A gentle  control arteriogram performed just proximal to the origin of the left vertebral artery demonstrated wide patency and brisk flow intracranially. An 8 French Angio-Seal closure device was then deployed at the right groin puncture site. Distal pulses remained Dopplerable in both feet unchanged. A flat panel CT of the brain demonstrated no hemorrhagic complications. 8 mm Integrilin was infused intra-arterially in order to prevent platelet aggregation at the angioplasty site. The patient was left intubated because of his medical comorbidities. He was then transferred to the neuro ICU for post revascularization care. IMPRESSION: Status post endovascular revascularization of high-grade stenosis of the dominant left vertebrobasilar junction of the left posterior-inferior cerebellar artery preprocedural caliber of approximately 70%. Extensive diffuse intracranial arteriosclerotic disease affecting primarily the posterior cerebral arteries, the left superior cerebellar artery and the left posterior-inferior cerebellar artery. PLAN: Follow-up in the clinic 1 month post discharge. Electronically Signed   By: Luanne Bras M.D.   On: 02/27/2023 08:25   MR BRAIN WO CONTRAST  Result Date: 02/26/2023 CLINICAL DATA:  Initial evaluation for neuro deficit, stroke. EXAM: MRI HEAD WITHOUT CONTRAST MRA HEAD WITHOUT CONTRAST TECHNIQUE: Multiplanar, multi-echo pulse sequences of the brain and surrounding structures were acquired without intravenous contrast. Angiographic images of the Circle of Willis were acquired using MRA technique without intravenous contrast. COMPARISON:  Prior CT from 02/25/2023 as well as earlier studies. FINDINGS: MRI HEAD FINDINGS Brain: Cerebral volume within normal limits. Mild chronic microvascular ischemic disease for age. Small remote infarct noted at the left basal ganglia/corona radiata. Scattered chronic bilateral cerebellar infarcts, left greater than right. Patchy restricted diffusion involving  the left greater than right cerebellum, consistent with acute ischemic infarcts. Additional patchy small volume acute ischemic changes noted within the left PCA distribution with involvement of the left thalamus. No associated hemorrhage or mass effect. No other evidence for acute or subacute ischemia. No acute intracranial hemorrhage. Chronic microhemorrhage within the right cerebellum noted, stable. No mass lesion, midline shift or mass effect. No hydrocephalus or extra-axial fluid collection. Pituitary gland and suprasellar region within normal limits. Vascular: Major intracranial vascular flow voids are maintained. Skull and upper cervical spine: Craniocervical junction normal. Bone marrow signal intensity within normal limits. No scalp soft tissue abnormality. Sinuses/Orbits: Globes and orbital soft tissues within normal limits. Mild scattered mucosal thickening present about the paranasal sinuses. Small bilateral mastoid effusions. Patient is intubated. Other: None. MRA HEAD FINDINGS Anterior circulation: Visualized distal cervical segments of the internal carotid arteries are patent with antegrade flow. Atheromatous change within the carotid siphons with associated moderate multifocal narrowing on the  left, and more mild narrowing on the right. Focal moderate proximal right A1 stenosis (series 553, image 8). Left A1 patent. Normal anterior communicating artery complex. Both anterior cerebral arteries patent without stenosis. Left M1 segment widely patent. Short-segment mild distal right M1 stenosis (series 553, image 6). No proximal MCA branch occlusion. Moderate small vessel atheromatous irregularity throughout the MCA branches with associated moderate multifocal narrowing. More severe left M3 stenosis noted as well (series 553, image 5). Posterior circulation: Both V4 segments patent without stenosis. Left vertebral artery slightly dominant. Left PICA grossly patent proximally. Right PICA not well seen.  Basilar widely patent proximally with mild to moderate stenosis at the basilar tip. Superior cerebral arteries patent bilaterally. Left PCA supplied via the basilar. Multifocal severe tandem segmental stenoses involving the proximal and mid left P2 segment with severely attenuated flow distally, also seen on prior CTA. Fetal type origin of the right PCA. Right PCA irregular but patent proximally. Moderate distal right P2 stenosis noted (series 554, image 8). Right PCA remains patent to its distal aspect. Anatomic variants: As above.  No aneurysm. IMPRESSION: MRI HEAD IMPRESSION: 1. Patchy acute ischemic infarcts involving the left greater than right cerebellum as well as the left PCA distribution. No associated hemorrhage or significant mass effect. 2. 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 3. Underlying mild chronic microvascular ischemic disease for age. MRA HEAD IMPRESSION: 1. Advanced intracranial atherosclerotic disease. Most notable findings include severe tandem left P2 stenoses with severely attenuated flow distally, in keeping with the left PCA distribution infarct. Additional mild to moderate multifocal narrowing about the carotid siphons, right M1 segment, and distal MCA branches as above. 2. Overall, appearance is similar as compared to prior CTA. Electronically Signed   By: Jeannine Boga M.D.   On: 02/26/2023 19:29   MR ANGIO HEAD WO CONTRAST  Result Date: 02/26/2023 CLINICAL DATA:  Initial evaluation for neuro deficit, stroke. EXAM: MRI HEAD WITHOUT CONTRAST MRA HEAD WITHOUT CONTRAST TECHNIQUE: Multiplanar, multi-echo pulse sequences of the brain and surrounding structures were acquired without intravenous contrast. Angiographic images of the Circle of Willis were acquired using MRA technique without intravenous contrast. COMPARISON:  Prior CT from 02/25/2023 as well as earlier studies. FINDINGS: MRI  HEAD FINDINGS Brain: Cerebral volume within normal limits. Mild chronic microvascular ischemic disease for age. Small remote infarct noted at the left basal ganglia/corona radiata. Scattered chronic bilateral cerebellar infarcts, left greater than right. Patchy restricted diffusion involving the left greater than right cerebellum, consistent with acute ischemic infarcts. Additional patchy small volume acute ischemic changes noted within the left PCA distribution with involvement of the left thalamus. No associated hemorrhage or mass effect. No other evidence for acute or subacute ischemia. No acute intracranial hemorrhage. Chronic microhemorrhage within the right cerebellum noted, stable. No mass lesion, midline shift or mass effect. No hydrocephalus or extra-axial fluid collection. Pituitary gland and suprasellar region within normal limits. Vascular: Major intracranial vascular flow voids are maintained. Skull and upper cervical spine: Craniocervical junction normal. Bone marrow signal intensity within normal limits. No scalp soft tissue abnormality. Sinuses/Orbits: Globes and orbital soft tissues within normal limits. Mild scattered mucosal thickening present about the paranasal sinuses. Small bilateral mastoid effusions. Patient is intubated. Other: None. MRA HEAD FINDINGS Anterior circulation: Visualized distal cervical segments of the internal carotid arteries are patent with antegrade flow. Atheromatous change within the carotid siphons with associated moderate multifocal narrowing on the left, and more mild narrowing on  the right. Focal moderate proximal right A1 stenosis (series 553, image 8). Left A1 patent. Normal anterior communicating artery complex. Both anterior cerebral arteries patent without stenosis. Left M1 segment widely patent. Short-segment mild distal right M1 stenosis (series 553, image 6). No proximal MCA branch occlusion. Moderate small vessel atheromatous irregularity throughout the MCA  branches with associated moderate multifocal narrowing. More severe left M3 stenosis noted as well (series 553, image 5). Posterior circulation: Both V4 segments patent without stenosis. Left vertebral artery slightly dominant. Left PICA grossly patent proximally. Right PICA not well seen. Basilar widely patent proximally with mild to moderate stenosis at the basilar tip. Superior cerebral arteries patent bilaterally. Left PCA supplied via the basilar. Multifocal severe tandem segmental stenoses involving the proximal and mid left P2 segment with severely attenuated flow distally, also seen on prior CTA. Fetal type origin of the right PCA. Right PCA irregular but patent proximally. Moderate distal right P2 stenosis noted (series 554, image 8). Right PCA remains patent to its distal aspect. Anatomic variants: As above.  No aneurysm. IMPRESSION: MRI HEAD IMPRESSION: 1. Patchy acute ischemic infarcts involving the left greater than right cerebellum as well as the left PCA distribution. No associated hemorrhage or significant mass effect. 2. 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 3. Underlying mild chronic microvascular ischemic disease for age. MRA HEAD IMPRESSION: 1. Advanced intracranial atherosclerotic disease. Most notable findings include severe tandem left P2 stenoses with severely attenuated flow distally, in keeping with the left PCA distribution infarct. Additional mild to moderate multifocal narrowing about the carotid siphons, right M1 segment, and distal MCA branches as above. 2. Overall, appearance is similar as compared to prior CTA. Electronically Signed   By: Jeannine Boga M.D.   On: 02/26/2023 19:29   ECHOCARDIOGRAM COMPLETE  Result Date: 02/25/2023    ECHOCARDIOGRAM REPORT   Patient Name:   ASUNCION GUIDER Date of Exam: 02/25/2023 Medical Rec #:  [id number]             Height:       72.0 in  Accession #:    [id number]            Weight:       250.7 lb Date of Birth:  08-17-1946             BSA:          2.345 m Patient Age:    64 years              BP:           107/53 mmHg Patient Gender: M                     HR:           73 bpm. Exam Location:  Inpatient Procedure: 2D Echo, Cardiac Doppler and Color Doppler Indications:    Stroke  History:        Patient has prior history of Echocardiogram examinations. CAD,                 Prior CABG, Arrythmias:Atrial Fibrillation; Risk                 Factors:Hypertension and Diabetes.  Sonographer:    Danne Baxter RDCS, FE, PE Referring Phys: [id number] [place]  1. Left ventricular ejection fraction, by estimation, is 50 to 55%. The left ventricle has low normal function. The left ventricle  demonstrates regional wall motion abnormalities (see scoring diagram/findings for description). There is mild concentric left ventricular hypertrophy. Left ventricular diastolic parameters are consistent with Grade II diastolic dysfunction (pseudonormalization).  2. Right ventricular systolic function is normal. The right ventricular size is normal.  3. The mitral valve is normal in structure. No evidence of mitral valve regurgitation. No evidence of mitral stenosis.  4. The aortic valve is normal in structure. Aortic valve regurgitation is not visualized. Aortic valve sclerosis is present, with no evidence of aortic valve stenosis.  5. There is borderline dilatation of the ascending aorta, measuring 36 mm.  6. The inferior vena cava is normal in size with greater than 50% respiratory variability, suggesting right atrial pressure of 3 mmHg. FINDINGS  Left Ventricle: Left ventricular ejection fraction, by estimation, is 50 to 55%. The left ventricle has low normal function. The left ventricle demonstrates regional wall motion abnormalities. The left ventricular internal cavity size was normal in size. There is mild concentric left ventricular hypertrophy.  Left ventricular diastolic parameters are consistent with Grade II diastolic dysfunction (pseudonormalization).  LV Wall Scoring: The basal inferolateral segment and mid inferior segment are akinetic. The entire anterior wall, antero-lateral wall, mid and distal lateral wall, entire septum, entire apex, and basal inferior segment are hypokinetic. Right Ventricle: The right ventricular size is normal. No increase in right ventricular wall thickness. Right ventricular systolic function is normal. Left Atrium: Left atrial size was normal in size. Right Atrium: Right atrial size was normal in size. Pericardium: There is no evidence of pericardial effusion. Presence of epicardial fat layer. Mitral Valve: The mitral valve is normal in structure. No evidence of mitral valve regurgitation. No evidence of mitral valve stenosis. Tricuspid Valve: The tricuspid valve is normal in structure. Tricuspid valve regurgitation is mild . No evidence of tricuspid stenosis. Aortic Valve: The aortic valve is normal in structure. Aortic valve regurgitation is not visualized. Aortic valve sclerosis is present, with no evidence of aortic valve stenosis. Pulmonic Valve: The pulmonic valve was normal in structure. Pulmonic valve regurgitation is not visualized. No evidence of pulmonic stenosis. Aorta: The aortic root is normal in size and structure. There is borderline dilatation of the ascending aorta, measuring 36 mm. Venous: The inferior vena cava is normal in size with greater than 50% respiratory variability, suggesting right atrial pressure of 3 mmHg. IAS/Shunts: No atrial level shunt detected by color flow Doppler.  LEFT VENTRICLE PLAX 2D LVIDd:         5.20 cm   Diastology LVIDs:         3.50 cm   LV e' medial:    8.08 cm/s LV PW:         1.00 cm   LV E/e' medial:  14.2 LV IVS:        1.20 cm   LV e' lateral:   9.48 cm/s LVOT diam:     2.10 cm   LV E/e' lateral: 12.1 LV SV:         84 LV SV Index:   36 LVOT Area:     3.46 cm  RIGHT  VENTRICLE RV S prime:     9.17 cm/s TAPSE (M-mode): 1.3 cm LEFT ATRIUM             Index        RIGHT ATRIUM           Index LA diam:        3.70 cm 1.58 cm/m   RA Area:  19.40 cm LA Vol (A2C):   42.7 ml 18.21 ml/m  RA Volume:   51.60 ml  22.00 ml/m LA Vol (A4C):   70.1 ml 29.89 ml/m LA Biplane Vol: 56.0 ml 23.88 ml/m  AORTIC VALVE LVOT Vmax:   122.00 cm/s LVOT Vmean:  80.800 cm/s LVOT VTI:    0.243 m  AORTA Ao Root diam: 3.30 cm Ao Asc diam:  3.60 cm MITRAL VALVE                TRICUSPID VALVE MV Area (PHT): 3.13 cm     TR Peak grad:   12.0 mmHg MV Decel Time: 242 msec     TR Vmax:        173.00 cm/s MV E velocity: 115.00 cm/s MV A velocity: 90.30 cm/s   SHUNTS MV E/A ratio:  1.27         Systemic VTI:  0.24 m                             Systemic Diam: 2.10 cm Godfrey Pick Tobb DO Electronically signed by Berniece Salines DO Signature Date/Time: 02/25/2023/10:21:33 AM    Final    DG Chest Port 1 View  Result Date: 02/25/2023 CLINICAL DATA:  Acute respiratory failure EXAM: PORTABLE CHEST 1 VIEW COMPARISON:  02/24/2023 FINDINGS: Endotracheal tube with tip at the clavicular heads. An enteric tube reaches the stomach. Artifact from EKG leads. Elevated right diaphragm with hazy infiltrate at the right base. No edema, effusion, or pneumothorax. Cardiomegaly.  Prior CABG. IMPRESSION: Stable hardware positioning and volume loss at the right lung base. Electronically Signed   By: Jorje Guild M.D.   On: 02/25/2023 05:27   CT HEAD WO CONTRAST (5MM)  Result Date: 02/25/2023 CLINICAL DATA:  Initial evaluation for acute mental status change. EXAM: CT HEAD WITHOUT CONTRAST TECHNIQUE: Contiguous axial images were obtained from the base of the skull through the vertex without intravenous contrast. RADIATION DOSE REDUCTION: This exam was performed according to the departmental dose-optimization program which includes automated exposure control, adjustment of the mA and/or kV according to patient size and/or use of  iterative reconstruction technique. COMPARISON:  Prior studies from 02/24/2023. FINDINGS: Brain: Mild age-related cerebral atrophy. Remote ischemic change at the left basal ganglia/corona radiata. 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 No acute intracranial hemorrhage. No visible acute large vessel territory infarct. No mass lesion or midline shift. No hydrocephalus or extra-axial fluid collection. Vascular: Contrast material remains on board from recent procedures. Scattered calcified atherosclerosis present about the skull base. Skull: Scalp soft tissues and calvarium demonstrate no new finding. Sinuses/Orbits: Globes orbital soft tissues within normal limits. Scattered mucosal thickening present throughout the paranasal sinuses. Patient is intubated. Mastoid air cells remain clear. Other: None. IMPRESSION: 1. No acute intracranial abnormality. 2. Chronic ischemic changes involving the left basal ganglia/corona radiata and left greater than right cerebellum, stable. Electronically Signed   By: Jeannine Boga M.D.   On: 02/25/2023 02:30   CT ABDOMEN PELVIS WO CONTRAST  Addendum Date: 02/25/2023   ADDENDUM REPORT: 02/25/2023 01:09 ADDENDUM: These results were called by telephone at the time of interpretation on 02/25/2023 at 1:09 am to provider [facility] , who verbally acknowledged these results. Electronically Signed   By: Fidela Salisbury M.D.   On: 02/25/2023 01:09   Result Date: 02/25/2023 CLINICAL DATA:  Retroperitoneal hemorrhage EXAM: CT ABDOMEN AND PELVIS WITHOUT CONTRAST TECHNIQUE: Multidetector CT imaging of the abdomen and pelvis  was performed following the standard protocol without IV contrast. RADIATION DOSE REDUCTION: This exam was performed according to the departmental dose-optimization program which includes automated exposure control, adjustment of the mA and/or kV according to patient size and/or use of iterative reconstruction technique. COMPARISON:  None Available.  FINDINGS: Lower chest: Bibasilar atelectasis. Extensive coronary artery calcification. Nasogastric tube tip extends into the proximal body of the stomach. Hepatobiliary: No focal liver abnormality is seen. No gallstones, gallbladder wall thickening, or biliary dilatation. Pancreas: Unremarkable Spleen: Unremarkable Adrenals/Urinary Tract: The adrenal glands are unremarkable. The kidneys are normal in position. Contrast enhancement of the renal cortices and contrast within the renal collecting system is related to recent CT arteriography and endovascular procedure. A moderate right subcapsular hematoma is seen involving the mid lower pole the right kidney with moderate extension of hemorrhage into the posterior pararenal space. There are, additionally, punctate foci of gas along the posteroinferior aspect of the subcapsular hematoma suggesting possible intervention, as can be seen with recent renal biopsy, or, less likely, superimposed infection. The left kidney is unremarkable. The bladder is decompressed and is unremarkable. Stomach/Bowel: Stomach is within normal limits. Appendix appears normal. No evidence of bowel wall thickening, distention, or inflammatory changes. Vascular/Lymphatic: Aortic atherosclerosis. No enlarged abdominal or pelvic lymph nodes. Reproductive: Prostate is unremarkable. Other: Mild asymmetric subcutaneous edema within the right flank may relate to trauma or dependent positioning. No abdominal wall hernia. Musculoskeletal: Acute fractures of the right eleventh and twelfth ribs are identified posteriorly. Moderate L1 compression deformity with 40-50% loss of height is seen likely remote in nature. No associated retropulsion. Degenerative changes are seen within the visualized thoracolumbar spine. IMPRESSION: 1. Moderate right renal subcapsular hematoma with moderate extension of hemorrhage into the posterior pararenal space. Punctate foci of gas along the posteroinferior aspect of the  subcapsular hematoma suggesting possible intervention, as can be seen with recent renal biopsy, or, less likely, superimposed infection. Correlation for history of recent biopsy is recommended. 2. Acute fractures of the right eleventh and twelfth ribs posteriorly. 3. Extensive coronary artery calcification. 4. Moderate L1 compression deformity, likely remote in nature. 5. Aortic atherosclerosis. Aortic Atherosclerosis (ICD10-I70.0). Electronically Signed: By: Fidela Salisbury M.D. On: 02/25/2023 01:00   DG CHEST PORT 1 VIEW  Result Date: 02/25/2023 CLINICAL DATA:  Feeding tube placement.  Acute respiratory failure. EXAM: PORTABLE CHEST 1 VIEW, ABDOMEN PORTABLE ONE VIEW COMPARISON:  02/24/2023. FINDINGS: Heart is enlarged in the mediastinal contour is stable. The pulmonary vasculature is distended. Lung volumes are low with minimal atelectasis at the lung bases bilaterally. There is a small right pleural effusion. No pneumothorax. Sternotomy wires are noted over the midline. The endotracheal tube terminates 4.9 cm above the carina. There is no evidence of bowel obstruction on limited view of the upper abdomen. The enteric tube terminates in the proximal stomach. IMPRESSION: 1. Cardiomegaly with mildly distended pulmonary vasculature. 2. Small right pleural effusion with atelectasis at the right lung base. 3. Support apparatus as detailed above. Electronically Signed   By: Brett Fairy M.D.   On: 02/25/2023 00:29   DG Abd Portable 1V  Result Date: 02/25/2023 CLINICAL DATA:  Feeding tube placement.  Acute respiratory failure. EXAM: PORTABLE CHEST 1 VIEW, ABDOMEN PORTABLE ONE VIEW COMPARISON:  02/24/2023. FINDINGS: Heart is enlarged in the mediastinal contour is stable. The pulmonary vasculature is distended. Lung volumes are low with minimal atelectasis at the lung bases bilaterally. There is a small right pleural effusion. No pneumothorax. Sternotomy wires are noted over the midline.  The endotracheal tube  terminates 4.9 cm above the carina. There is no evidence of bowel obstruction on limited view of the upper abdomen. The enteric tube terminates in the proximal stomach. IMPRESSION: 1. Cardiomegaly with mildly distended pulmonary vasculature. 2. Small right pleural effusion with atelectasis at the right lung base. 3. Support apparatus as detailed above. Electronically Signed   By: Brett Fairy M.D.   On: 02/25/2023 00:29   CT ANGIO HEAD NECK W WO CM W PERF (CODE STROKE)  Result Date: 02/24/2023 CLINICAL DATA:  Concern for left MCA or PCA stroke. Decline in speech and right-sided weakness. EXAM: CT ANGIOGRAPHY HEAD AND NECK CT PERFUSION BRAIN TECHNIQUE: Multidetector CT imaging of the head and neck was performed using the standard protocol during bolus administration of intravenous contrast. Multiplanar CT image reconstructions and MIPs were obtained to evaluate the vascular anatomy. Carotid stenosis measurements (when applicable) are obtained utilizing NASCET criteria, using the distal internal carotid diameter as the denominator. Multiphase CT imaging of the brain was performed following IV bolus contrast injection. Subsequent parametric perfusion maps were calculated using RAPID software. RADIATION DOSE REDUCTION: This exam was performed according to the departmental dose-optimization program which includes automated exposure control, adjustment of the mA and/or kV according to patient size and/or use of iterative reconstruction technique. CONTRAST:  139m OMNIPAQUE IOHEXOL 350 MG/ML SOLN COMPARISON:  Same-day CT head.  Carotid Doppler 03/19/2020. FINDINGS: CTA NECK FINDINGS Aortic arch: There is mild calcified plaque in the imaged aortic arch. The origins of the major branch vessels are patent. The subclavian arteries are patent to the level imaged. Right carotid system: The right common, internal, and external carotid arteries are patent with calcified plaque at the bifurcation resulting in less than 50%  stenosis of the proximal internal carotid artery. The distal internal carotid artery is patent the external carotid artery is patent. There is no dissection or aneurysm. Left carotid system: The left common carotid artery is patent. There is bulky plaque at the bifurcation resulting in up to approximately 70% stenosis. The distal internal carotid artery is patent. The external carotid artery is patent. There is no evidence of dissection or aneurysm. Vertebral arteries: The vertebral arteries are patent, without hemodynamically significant stenosis or occlusion. There is no evidence of dissection or aneurysm Skeleton: There is no acute osseous abnormality or suspicious osseous lesion. There is no visible canal hematoma. Other neck: The soft tissues of the neck are unremarkable. Upper chest: The imaged lung apices are clear. Review of the MIP images confirms the above findings CTA HEAD FINDINGS Anterior circulation: There is extensive calcified plaque in the carotid siphons resulting severe stenosis bilaterally. The left M1 segment is patent. There is short-segment severe stenosis of left M2 branches in the sylvian fissure proximally (8-117). There is no evidence of occlusion. There is moderate stenosis of the distal right M1 segment. There is focal moderate stenosis of a right M2 branch in the sylvian fissure (8-123). The distal branches are otherwise patent, without other proximal high-grade stenosis or occlusion. There is severe stenosis of the origin of the right A1 segment (6-162). Otherwise, the ACAs are patent, without other proximal high-grade stenosis or occlusion. The anterior communicating artery is normal. There is no aneurysm or AVM. Posterior circulation: There is calcified plaque in the bilateral V4 segments resulting severe stenosis bilaterally. The PICA origins are not identified. The basilar artery is patent with moderate stenosis distally ([id number]. There is multifocal short-segment occlusion of the  left P1 and P2 segments (8-134,  141). There is intermittent diminutive reconstitution of flow in the distal branches. There is a fetal origin of the right PCA. There is severe stenosis of the proximal P2 segment (8-137). There is multifocal irregularity and narrowing of the distal branches without evidence of occlusion. There is no aneurysm or AVM. Venous sinuses: Patent. Anatomic variants: None. Review of the MIP images confirms the above findings CT Brain Perfusion Findings: ASPECTS: 10 on the head CT obtained 5:18 p.m. CBF (<30%) Volume: 57m Perfusion (Tmax>6.0s) volume: 251mMismatch Volume: 2027mnfarction Location:No infarct core is identified. A portion of the ischemic brain is identified in the left occipital lobe which would correspond with the PCA occlusion. IMPRESSION: 1. Multifocal short-segment occlusions of the left P1 and P2 segments with diminutive distal reconstitution. 2. 20 cc ischemic penumbra identified some of which is in the left occipital lobe which would correspond to the PCA occlusion. No core infarct identified. 3. Additional advanced intracranial atherosclerotic disease with multifocal severe stenoses in the anterior and posterior circulations as detailed above. 4. Bulky calcified plaque at the left carotid bifurcation resulting in approximately 70% stenosis of the proximal internal carotid artery. Mild plaque on the right without greater than 50% stenosis. Patent vertebral arteries in the neck. Findings discussed with Dr. KirLeonel Ramsay 5:58 p.m. Electronically Signed   By: PetValetta MoleD.   On: 02/24/2023 18:31   CT HEAD WO CONTRAST (5MM)  Result Date: 02/24/2023 CLINICAL DATA:  Acute stroke suspected, weakness, hyperglycemia EXAM: CT HEAD WITHOUT CONTRAST TECHNIQUE: Contiguous axial images were obtained from the base of the skull through the vertex without intravenous contrast. RADIATION DOSE REDUCTION: This exam was performed according to the departmental dose-optimization  program which includes automated exposure control, adjustment of the mA and/or kV according to patient size and/or use of iterative reconstruction technique. COMPARISON:  03/18/2020 FINDINGS: Brain: No evidence of acute infarction, hemorrhage, hydrocephalus, extra-axial collection or mass lesion/mass effect. Unchanged subtle white matter hypodensity of the left corona radiata (series 2, image 18). Unchanged encephalomalacia of the cerebellar hemispheres. Vascular: No hyperdense vessel or unexpected calcification. Skull: Normal. Negative for fracture or focal lesion. Sinuses/Orbits: No acute finding. Other: None. IMPRESSION: 1. No acute intracranial pathology. 2. 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 Electronically Signed   By: AleDelanna AhmadiD.   On: 02/24/2023 17:37   DG Chest Portable 1 View  Result Date: 02/24/2023 CLINICAL DATA:  Shortness of breath. EXAM: PORTABLE CHEST 1 VIEW COMPARISON:  12/17/2022 FINDINGS: Previous median sternotomy and CABG procedure. Stable cardiac enlargement. Lung volumes are low. Slight asymmetric elevation of the right hemidiaphragm with blunting of the right costophrenic angle. No interstitial edema or airspace disease. Visualized osseous structures appear intact. No displaced rib fractures identified. IMPRESSION: 1. Suspect small right pleural effusion. 2. Low lung volumes. 3. Stable cardiac enlargement. Electronically Signed   By: TayKerby MoorsD.   On: 02/24/2023 13:17    Microbiology Recent Results (from the past 240 hour(s))  Blood culture (routine x 2)     Status: None   Collection Time: 02/24/23  1:28 PM   Specimen: BLOOD  Result Value Ref Range Status   Specimen Description BLOOD RIGHT ANTECUBITAL  Final   Special Requests   Final    BOTTLES DRAWN AEROBIC AND ANAEROBIC Blood Culture results may not be optimal due to an excessive volume of  blood received in culture bottles   Culture  Final    NO GROWTH 5 DAYS Performed at [facility], [address] [place], [place] [address]    Report Status 03/17/2023 FINAL  Final  Blood culture (routine x 2)     Status: None   Collection Time: 02/24/23  1:28 PM   Specimen: BLOOD  Result Value Ref Range Status   Specimen Description BLOOD LEFT ANTECUBITAL  Final   Special Requests   Final    BOTTLES DRAWN AEROBIC AND ANAEROBIC Blood Culture results may not be optimal due to an excessive volume of blood received in culture bottles   Culture   Final    NO GROWTH 5 DAYS Performed at [facility], [address], [place], [place] [address]    Report Status 03/04/2023 FINAL  Final  Resp panel by RT-PCR (RSV, Flu A&B, Covid) Anterior Nasal Swab     Status: None   Collection Time: 02/24/23  8:01 PM   Specimen: Anterior Nasal Swab  Result Value Ref Range Status   SARS Coronavirus 2 by RT PCR NEGATIVE NEGATIVE Final   Influenza A by PCR NEGATIVE NEGATIVE Final   Influenza B by PCR NEGATIVE NEGATIVE Final    Comment: (NOTE) The Xpert Xpress SARS-CoV-2/FLU/RSV plus assay is intended as an aid in the diagnosis of influenza from Nasopharyngeal swab specimens and should not be used as a sole basis for treatment. Nasal washings and aspirates are unacceptable for Xpert Xpress SARS-CoV-2/FLU/RSV testing.  Fact Sheet for Patients: EntrepreneurPulse.com.au  Fact Sheet for Healthcare Providers: IncredibleEmployment.be  This test is not yet approved or cleared by the [place] FDA and has been authorized for detection and/or diagnosis of SARS-CoV-2 by FDA under an Emergency Use Authorization (EUA). This EUA will remain in effect (meaning this test can be used) for the duration of the COVID-19 declaration under Section 564(b)(1) of the Act, 21 U.S.C. section 360bbb-3(b)(1), unless the authorization is terminated or revoked.      Resp Syncytial Virus by PCR NEGATIVE NEGATIVE Final    Comment: (NOTE) Fact Sheet for Patients: EntrepreneurPulse.com.au  Fact Sheet for Healthcare Providers: IncredibleEmployment.be  This test is not yet approved or cleared by the [place] FDA and has been authorized for detection and/or diagnosis of SARS-CoV-2 by FDA under an Emergency Use Authorization (EUA). This EUA will remain in effect (meaning this test can be used) for the duration of the COVID-19 declaration under Section 564(b)(1) of the Act, 21 U.S.C. section 360bbb-3(b)(1), unless the authorization is terminated or revoked.  Performed at [facility], [place] [address] [place], [place] [address]   MRSA Next Gen by PCR, Nasal     Status: Abnormal   Collection Time: 02/24/23 10:36 PM   Specimen: Nasal Mucosa; Nasal Swab  Result Value Ref Range Status   MRSA by PCR Next Gen DETECTED (A) NOT DETECTED Final    Comment: RESULT CALLED TO, READ BACK BY AND VERIFIED WITH: K JONES,RN'@0122'$  02/25/23 [place] (NOTE) The GeneXpert MRSA Assay (FDA approved for NASAL specimens only), is one component of a comprehensive MRSA colonization surveillance program. It is not intended to diagnose MRSA infection nor to guide or monitor treatment for MRSA infections. Test performance is not FDA approved in patients less than 57 years old. Performed at [facility], [place] [address] [place], [place] [address]   Culture, blood (Routine X 2) w Reflex to ID Panel     Status: None   Collection Time: 02/24/23 11:22 PM   Specimen: BLOOD RIGHT HAND  Result Value Ref Range Status   Specimen Description BLOOD RIGHT HAND  Final   Special Requests   Final    BOTTLES DRAWN AEROBIC AND ANAEROBIC Blood Culture adequate volume   Culture   Final    NO GROWTH 5 DAYS Performed at [facility], [address], [place], [place] [address]    Report Status 03/10/2023 FINAL  Final  Culture, blood (Routine X  2) w Reflex to ID Panel     Status: None   Collection Time: 02/24/23 11:24 PM   Specimen: BLOOD RIGHT ARM  Result Value Ref Range Status   Specimen Description BLOOD RIGHT ARM  Final   Special Requests   Final    BOTTLES DRAWN AEROBIC AND ANAEROBIC Blood Culture adequate volume   Culture   Final    NO GROWTH 5 DAYS Performed at [facility], [place] [address] [place] [address] [place],  [address]    Report Status 03/24/2023 FINAL  Final    Lab Basic Metabolic Panel: Recent Labs  Lab 02/25/23 1636 02/26/23 0433 02/26/23 1809 02/27/23 0304 02/27/23 1033 02/28/23 0333 02/28/23 1346 03/07/2023 0556  NA  --  132*  --  133* 135 135 136 135  K  --  5.0  --  4.8 4.8 4.9 4.8 4.7  CL  --  102  --  104  --  104 105 104  CO2  --  19*  --  19*  --  17* 16* 18*  GLUCOSE  --  200*  --  115*  --  149* 204* 166*  BUN  --  62*  --  76*  --  91* 102* 116*  CREATININE  --  3.37*  --  4.46*  --  4.73* 5.06* 5.36*  CALCIUM  --  7.5*  --  7.4*  --  7.6* 7.6* 7.5*  MG 2.9* 3.0* 2.6* 2.9*  --   --   --  2.8*  PHOS 5.3* 6.0* 6.8* 7.1*  --   --   --  9.2*   Liver Function Tests: Recent Labs  Lab 02/24/23 1143 02/24/23 2320 02/26/23 0433  AST '27 26 31  '$ ALT '20 18 10  '$ ALKPHOS 77 70 62  BILITOT 1.5* 1.3* 1.3*  PROT 6.5 5.9* 5.0*  ALBUMIN 3.3* 2.9* 2.3*   No results for input(s): "LIPASE", "AMYLASE" in the last 168 hours. No results for input(s): "AMMONIA" in the last 168 hours. CBC: Recent Labs  Lab 02/24/23 1143 02/24/23 2013 02/25/23 0537 02/25/23 1333 02/26/23 0433 02/27/23 0304 02/27/23 1033 02/27/23 1344 02/28/23 0333 03/07/2023 0556  WBC 17.6*   < > 17.8*  --  12.4* 7.8  --   --  7.5 8.0  NEUTROABS 15.1*  --  15.4*  --   --   --   --   --  5.7 6.2  HGB 6.6*   < > 7.0*   < > 10.0* 8.5* 8.2*  --  8.3* 8.1*  HCT 20.4*   < > 19.8*   < > 28.9* 25.6* 24.0*  --  25.6* 25.7*  MCV 86.4   < > 87.2  --  88.1 88.3  --   --  90.5 93.1  PLT 171   < > 171  --  154 138*  --  145* 152 174   < > =  values in this interval not displayed.   Cardiac Enzymes: No results for input(s): "CKTOTAL", "CKMB", "CKMBINDEX", "TROPONINI" in the last 168 hours. Sepsis Labs:  Recent Labs  Lab 02/24/23 1201 02/24/23 1421 02/24/23 2320 02/25/23 0213 02/25/23 0537 02/26/23 0433 02/27/23 0304 02/28/23 0333 03/24/2023 0556  WBC  --   --  15.8*  --    < > 12.4* 7.8 7.5 8.0  LATICACIDVEN 2.7* 2.0* 1.8 1.2  --   --   --   --   --    < > = values in this interval not displayed.    Procedures/Operations  Endotracheal intubation Left Vertebral Arteriogram and endovascular revascularization   Freddi Starr 03/09/2023, 3:46 PM

## 2023-03-29 NOTE — Progress Notes (Signed)
Pt extubated to comfort care. RN at bedside.

## 2023-03-29 NOTE — Accreditation Note (Signed)
Restraint Death within 24 hours of bilateral soft wrist restraints; logged 03/05/2023 at 1450 by Berle Mull, RNBSN

## 2023-03-29 NOTE — Progress Notes (Signed)
Palliative:  Discussed with RN and noted PCCM documentation of plans for transition to comfort care and extubation. Reviewed comfort medications with RN and adjusted orders to reflect comfort care. I did not interrupt family at bedside as they are saying spending time with him privately in preparation for transition to comfort and end of life.   No charge  Vinie Sill, NP Palliative Medicine Team Pager [phone] (Please see [email or web address] for schedule) Team Phone [phone]

## 2023-03-29 NOTE — IPAL (Signed)
  Interdisciplinary Goals of Care Family Meeting   Date carried out: 03/16/2023  Location of the meeting: Conference room  Member's involved: Physician, Nurse Practitioner, Bedside Registered Nurse, and Family Member or next of kin  Durable Power of Attorney or acting medical decision maker: Dawn and Javeon  Discussion: We discussed goals of care for [name] .  We discussed the current clinical status of patient with his son and daughter. Given his poor neurologic prognosis, he would not want to live or be in a nursing home for any period of time. We have decided against doing CRRT. Patient's family would like to transition to comfort care.  Code status:   Code Status: DNR   Disposition: In-patient comfort care  Time spent for the meeting: 15 minutes    Freddi Starr, MD  03/11/2023, 12:04 PM

## 2023-03-29 NOTE — Progress Notes (Signed)
50 ml of Versed wasted and 50 ml of Fent wasted with Martinique Allen RN.

## 2023-03-29 NOTE — Progress Notes (Signed)
TOD was called by I Eli Phillips RN and Judithe Modest RN. Family at the bedside. Dr. Erin Fulling, [place] and [place] was notified.

## 2023-03-29 NOTE — Progress Notes (Signed)
STROKE TEAM PROGRESS NOTE   INTERVAL HISTORY His daughter is at the bedside. He remains intubated on Versed, fentanyl, precedex. Hb stable. Cre getting worse, CCM planning for CRRT. However, CCM has discussed with daughter and she expressed that pt would not want any nursing home without quality of life. He was already quite debilitated at home prior to admission. Waiting for son coming for [place] discussion. Still has stimulation induced BUE posturing.    Vitals:   03/22/2023 0500 02/26/2023 0600 03/11/2023 0705 03/23/2023 0800  BP: 119/62 (!) 120/59    Pulse: 67 64    Resp: 15 20    Temp:    97.8 F (36.6 C)  TempSrc:    Oral  SpO2: 100% 100%    Weight:   124.2 kg    CBC:  Recent Labs  Lab 02/28/23 0333 03/21/2023 0556  WBC 7.5 8.0  NEUTROABS 5.7 6.2  HGB 8.3* 8.1*  HCT 25.6* 25.7*  MCV 90.5 93.1  PLT 152 [id number]   Basic Metabolic Panel:  Recent Labs  Lab 02/27/23 0304 02/27/23 1033 02/28/23 1346 03/23/2023 0556  NA 133*   < > 136 135  K 4.8   < > 4.8 4.7  CL 104   < > 105 104  CO2 19*   < > 16* 18*  GLUCOSE 115*   < > 204* 166*  BUN 76*   < > 102* 116*  CREATININE 4.46*   < > 5.06* 5.36*  CALCIUM 7.4*   < > 7.6* 7.5*  MG 2.9*  --   --  2.8*  PHOS 7.1*  --   --  9.2*   < > = values in this interval not displayed.   Lipid Panel:  Recent Labs  Lab 02/25/23 0537 02/26/23 0433 03/17/2023 0556  CHOL 126  --   --   TRIG 337*   < > 169*  HDL 29*  --   --   CHOLHDL 4.3  --   --   VLDL 67*  --   --   LDLCALC 30  --   --    < > = values in this interval not displayed.   HgbA1c:  Recent Labs  Lab 02/25/23 0537  HGBA1C 8.6*   Urine Drug Screen:  Recent Labs  Lab 02/09/2023 1806  LABOPIA NONE DETECTED  COCAINSCRNUR NONE DETECTED  LABBENZ NONE DETECTED  AMPHETMU NONE DETECTED  THCU NONE DETECTED  LABBARB NONE DETECTED    Alcohol Level  Recent Labs  Lab 01/29/2023 1806  ETH <10    IMAGING past 24 hours No results found.  PHYSICAL EXAM  Temp:  [97.6 F (36.4  C)-98.6 F (37 C)] 97.8 F (36.6 C) (03/04 0800) Pulse Rate:  [63-75] 64 (03/04 0600) Resp:  [13-20] 20 (03/04 0600) BP: (102-149)/(38-81) 120/59 (03/04 0600) SpO2:  [94 %-100 %] 100 % (03/04 0600) Arterial Line BP: (135-171)/(50-77) 147/54 (03/04 0600) FiO2 (%):  [60 %-100 %] 60 % (03/04 0742) Weight:  [124.2 kg-124.6 kg] 124.2 kg (03/04 0705)  General - Well nourished, well developed, intubated on sedation.  Ophthalmologic - fundi not visualized due to noncooperation.  Cardiovascular - Regular rate and rhythm, not in afib.  Neuro - intubated on sedation, eyes closed, not following commands. With forced eye opening, eyes in mid position, not blinking to visual threat, doll's eyes absent, not tracking, PERRL. Corneal reflex absent bilaterally, gag and cough present. Breathing over the vent.  Facial symmetry not able to test due to ET tube.  Tongue protrusion not cooperative. No movement in all extremities even with painful stimuli. Sensation, coordination and gait not tested.   ASSESSMENT/PLAN Mr. ARL VANDORN is a 77 y.o. male with history of uncontrolled type 2 diabetes, atrial fibrillation on Eliquis, CAD s/p CABG, CVA, hypertension, hyperlipidemia, HFpEF, restless leg syndrome who presents to the ED 2/2 weakness and hyperglycemia was found to be aphasic  Stroke:  Acute cerebellar infarct L>R and punctate right PCA infarct s/p balloon angioplasty of left VBJ, etiology: Diffuse multivessel severe intracranial stenosis  CT head No acute abnormality.  Old left BG/CR lacunar infarcts CTA head & neck Multifocal short-segment occlusions of the left P1 and P2 segments with diminutive distal reconstitution.   CTP - 20 cc ischemic penumbra in the left occipital lobe  Cerebral angio Severe preocclusive stenosis of the left vertebral basilar junction at the origin of the left posterior inferior cerebellar artery. High-grade stenosis of the left P2 segment, in the P3 segment. S/p balloon  angioplasty of left VBJ Repeat CT head No acute intracranial abnormality.  MRI extensive scattered cerebellar infarcts, L>R. Punctate left PCA infarcts.  MRA - Advanced intracranial atherosclerotic disease. Most notable findings include severe tandem left P2 stenoses with severely attenuated flow distally. Additional mild to moderate multifocal narrowing about the carotid siphons, right M1 segment, and distal MCA branches  2D Echo EF 50-55% hypertrophy  LDL 30 HgbA1c 11.0 VTE prophylaxis - SCD's Eliquis  prior to admission, now on no antithrombotics. May restart antithrombotics when bleeding stable and anemia improves. Therapy recommendations:  pending Disposition:  pending GOC discussion. Daughter stated that pt would not want any nursing home placement without quality of life  SIRPIDs  With pain stimulation, pt eyes wide open, but not blinking or tracking, no eye movement, bilateral pupil dilated, but still responsive to light, b/l UE flexion posturing intermittently but not rhythmic, no movement b/l LEs. O2 sat down to 80s.  EEG no seizure, consistent with encephalopathy Still intermittent with stimulation per RN  CAD s/p CABG x 3 (2017) Acute on chronic diastolic heart failure PAF on eliquis Eliquis on hold due to RP bleed Cardiology following  May consider antithrombotics once apporopriate  Hx of hypertension Hypertensive Urgency -> hypotension Home meds:  norvasc, losartan, metoprolol,  Stable now Off Cleviprex gtt or pressors Long-term BP goal normotensive  Hyperlipidemia Home meds:  zocor '80mg'$  LDL 30, goal < 70 decrease zocor to 40 Continue statin at discharge  Hx of stroke 06/2016 right pontine infarct on MRI. CUS neg, EF 55-60%, A1C 13.3, elevated TG. Discharged with plavix  Diabetes type II Uncontrolled DKA Home meds:  insulin, metformin, semaglutide HgbA1c 8.6, goal < 7.0 Currently off insulin gtt -> on insulin subq CBGs SSI Will need close outpatient follow    Acute hypoxic respiratory failure  Leukocytosis  Intubated - management by CCM On Versed and fentanyl and Precedex CXR as needed  WBC 17.6-12.4-7.8-7.5-8.0 On rocephin and flagyl   Acute blood loss anemia Retroperitoneal bleed Hgb 5.1->7.0->10.0 ->8.2->8.3 transfused blood 2/29 CT abdomen pelvis 2/29 moderate right renal subcapsular hematoma with moderate distention of hemorrhage into the posterior pararenal space Hold off antithrombotics Management per primary team  AKI Cre 2.70->2.66-> 2.96-> 3.37-> 4.46->4.73->5.36 BUN 102->116 On TF, off IVF CCM on board Will need CRRT but pending family GOC discussion  Dysphagia  OGT placed.  On TF @ 77 and off IVF Speech on board  Other Stroke Risk Factors Advanced Age >/= 21  Obesity, Body mass index is 37.14 kg/m.,  BMI >/= 30 associated with increased stroke risk, recommend weight loss, diet and exercise as appropriate   Other Active Problems Hyponatremia Na 134->132->135->135 Rib fracture - right 11th and 12th rib fracture on [facility] day # 5  This patient is critically ill due to stroke, intracranial vascular stenosis, severe anemia, retroperitoneal hemorrhage, AKI, respiratory failure and at significant risk of neurological worsening, death form recurrent stroke, hemorrhagic transformation, hypovolemic shock, sepsis, heart failure, renal failure. This patient's care requires constant monitoring of vital signs, hemodynamics, respiratory and cardiac monitoring, review of multiple databases, neurological assessment, discussion with family, other specialists and medical decision making of high complexity. I spent 45 minutes of neurocritical care time in the care of this patient. I had long discussion with daughter at bedside, updated pt current condition, treatment plan and potential prognosis, and answered all the questions.  She expressed understanding and appreciation.  I also discussed with CCM Dr. Erin Fulling.   Rosalin Hawking, MD  PhD Stroke Neurology 03/12/2023 11:01 AM   To contact Stroke Continuity provider, please refer to [email or web address] After hours, contact General Neurology

## 2023-03-29 NOTE — Progress Notes (Signed)
Brief note: - consulted by PCCM for consideration of CRRT in setting of stroke and AKI with oliguria and possibly uremia with volume overload - prognosis appears to be poor.  Pt is looking at long-term SNF, LTACH if trached - met with pt's dtr in the consultation room.  We discussed goals of dialysis, making decisions to start dialysis, and long-term expectations. - pt's dtr expresses that he did not have a very good QOL before this illness and that she does not want him even going to a SNF, much less an LTACH. - She is leaning towards not initiating RRT.  I think this is reasonable.   - brother is coming in for more discussion.   - I will follow peripherally until family meetings held- I am happy to be of assistance any way I can.  Madelon Lips MD [employer] Pgr [phone]

## 2023-03-29 NOTE — Progress Notes (Signed)
Pharmacy Antibiotic Note  [name] is a 77 y.o. male admitted on 02/24/2023 with  intra-abdominal infection .  Pharmacy has been consulted for vancomycin dosing.Gas seen on CT abdomen, concern for infection. WBC stable, afebrile. Patient with significant AKI with creatinine continuing to rise (baseline ~1.3-1.4)  Received vancomycin '2000mg'$  IV x1 on 3/1 - VR 21.   Scr uptrending, volume status up - to begin CRRT today 03/03/2023  Plan: Vancomycin 1250 mg IV x1 after initiation of CRRT 03/11/2023 PM - will obtain VR tomorrow AM  F/u LOT, cultures   Weight: 124.2 kg (273 lb 13 oz)  Temp (24hrs), Avg:98 F (36.7 C), Min:97.6 F (36.4 C), Max:98.6 F (37 C)  Recent Labs  Lab 02/24/23 1201 02/24/23 1421 02/24/23 1806 02/24/23 2320 02/25/23 0213 02/25/23 0537 02/26/23 0433 02/26/23 0910 02/27/23 0304 02/28/23 0333 02/28/23 0357 02/28/23 1346 03/28/2023 0556  WBC  --   --   --  15.8*  --  17.8* 12.4*  --  7.8 7.5  --   --  8.0  CREATININE  --  2.96*   < > 2.77*  --  2.66* 3.37*  --  4.46* 4.73*  --  5.06* 5.36*  LATICACIDVEN 2.7* 2.0*  --  1.8 1.2  --   --   --   --   --   --   --   --   VANCORANDOM  --   --   --   --   --   --   --  12  --   --  21  --   --    < > = values in this interval not displayed.     Estimated Creatinine Clearance: 15.5 mL/min (A) (by C-G formula based on SCr of 5.36 mg/dL (H)).    No Known Allergies  Antimicrobials this admission:  Vancomycin 2/28 >>  Ceftriaxone 3/1 >>  Flagyl 3/1 >> Cefepime x1 2/28  Dose adjustments this admission:   Microbiology results:  2/28 BCx: ngtd 2/28 MRSA PCR: detected   Thank you for allowing pharmacy to be a part of this patient's care.  Wilson Singer, PharmD Clinical Pharmacist 03/03/2023 9:26 AM

## 2023-03-29 NOTE — Progress Notes (Signed)
NAME:  [name], MRN:  [id number], DOB:  1946-01-11, LOS: 5 ADMISSION DATE:  02/09/2023, CONSULTATION DATE:  02/22/2023 REFERRING MD: Lorrin Goodell - Neuro CHIEF COMPLAINT: Acute stroke s/p thrombectomy  History of Present Illness:   77 year old man who initially presented to [facility] 2/28 for 2-3 days of weakness. S/p fall at home 2 days PTA. Transferred to [facility] 2/28PM for mechanical thrombectomy. Also with hyperkalemia in the setting of AKI and DKA. PMHx significant for HTN, HLD, CAD (s/p CABG x 3 2017), HFpEF (Echo 01/2023 with EF 50-55%), AF (on Eliquis), CVA (R pontine stroke, cerebellar stroke), T2DM, BPH, depression.  At [facility], patient was noted to have difficulty answering questions and word-finding. Patient's son reported a fall at home 2-3 days PTA that has limited patient's mobility due to pain; he felt there was a possibility patient had not been taking his medications. At baseline, patient lives alone and is independent with all ADLs. Labs at [facility] were notable for pH 7.32, pCO2 33, CBG 787. WBC 17.6, Hgb 6.6. Na 122, K 6.3, bicarb 16 (AG 14), BUN/Cr 70/2.97. BNP 685, Trop 2280. B-hydroxybutyrate 3.77, LA 2.7. UA +glucose, ketones, protein. C/f DKA and NSTEMI, insulin gtt started and Cards consulted with recommendation to defer [facility] in the setting of Hgb drift, ?bleed. Code Stroke was called for AMS and CT Head NAICA, consistent with prior infarcts. CTA Head/Neck showed L PCA (P1 P2) occlusions, L carotid plaque (70% stenosis). Decision for transfer to [facility] for emergent mechanical thrombectomy.  Underwent endovascular revascularization of high-grade L vertebral junction with balloon angioplasty (patency 70%) with NIR (Deveshwar) 2/28PM; post-procedure CT without ICH. Repeat CT Head completed 2/29 as patient was slow to wake from sedation, NAICA/stable. CT A/P was completed 2/29AM in the setting of Hgb drift with unknown cause, demonstrating moderate R renal subcapsular hematoma with extension  of hemorrhage into the posterior pararenal space and rib fractures (R 11th and 12th ribs); suspect this is related to recent fall at home PTA.  Remains critically in in 4N Neuro ICU.  Pertinent Medical History:   Past Medical History:  Diagnosis Date   Atrial fibrillation ([place])    a.) CHA2DS2VASc = 6 (age x 2,  HTN, CVA/TIA x 2, T2DM);  b.) s/p DCCV 09/25/2020 (120 J x1); c.) rate/rhythm maintained on oral metoprolol tartrate; chronically anticoagulated with apixaban   BPH (benign prostatic hyperplasia)    CAD (coronary artery disease) 10/28/2016   a.) LHC 10/28/2016: 95% dLAD, 90% p-mLCx, 75% mLAD, 60% m-dLAD, 99% p-mRCA, 75% dRCA --> consult CVTS; b.) 3v CABG at [facility] 11/05/2016   Carotid artery disease ([place]) 06/29/2016   a.) carotid US 06/29/2016: <50% BICA   Cerebellar stroke ([place]) 11/13/2015   a.) CT imaging 11/13/2015 revealed old/remote LEFT cerebellar and LEFT carona radiata infarcts   CHF (congestive heart failure) (HCC)    Depression    Diabetic foot ulcers (HCC)    Diastolic dysfunction [id number]   a.) TTE 06/28/2016: EF 55-60%, G1DD; b.) TTE 03/19/2020: EF 55-60%, mild BAE, mild-mod MR/TR.   HLD (hyperlipidemia)    Hypertension    Long term (current) use of anticoagulants    a.) apixaban   Long term current use of antithrombotics/antiplatelets    a.) clopidogrel   Neuropathy    PTSD (post-traumatic stress disorder)    Restless leg syndrome    a.) on ropinirole   Right pontine stroke ([place]) 06/28/2016   a.) MRI 06/28/2016: acute RIGHT paracentral pontine (non-hemorrhagic) CVA   S/P CABG x 3 11/05/2016  a.) LIMA-LAD, SVG-OM1, SVG-PDA   TIA (transient ischemic attack)    a.) multiple since last CVA in 2017 per daughter   Tubular adenoma of colon    Type 2 diabetes mellitus treated with insulin ([place])    Significant Hospital Events: Including procedures, antibiotic start and stop dates in addition to other pertinent events   2/28 - Presented to [facility] for weakness,  AMS. Found to be in DKA with c/f recurrent CVA (L PCA?). Also with NSTEMI, elevated trop. Taken to [facility] for ?thrombectomy, underwent endovascular revasc/balloon angioplasty of L vertebral/PCA, no stent. 2/29 - Slow to wake up post-sedation wean, repeat CT Head NAICA. Hgb 6.6, CT A/P with renal subcapsular hematoma/extension into posterior RP space, rib fx on R (11/12). Hgb 7.0, 2U PRBCs ordered. Remains intubated, sedated on insulin gtt. TF started. 3/01 - Sedation adjusted overnight to Versed/Fentanyl in the setting of elevated TG (Prop discontinued). Versed discontinued and Precedex initiated. Cleviprex transitioned to Cardene. Insulin gtt weaned off to SQ insulin. Hgb stable 10.0. Cr worse, US Renal with doppler pending. MRI/MRA Brain pending. 3/4 renal function continues to worsen. Markedly volume overloaded.   Interim History / Subjective:  Renal function continues to worsen Remains on vent/sedation with intermittent periods of agitation.  Afebrile, Normotensive Large BM yesterday following smog enema  Objective:  Blood pressure (!) 120/59, pulse 64, temperature 97.8 F (36.6 C), temperature source Oral, resp. rate 20, weight 124.2 kg, SpO2 100 %.    Vent Mode: PRVC;SIMV;PSV FiO2 (%):  [60 %-100 %] 60 % Set Rate:  [14 bmp-18 bmp] 14 bmp Vt Set:  [620 mL] 620 mL PEEP:  [8 cmH20-10 cmH20] 8 cmH20 Pressure Support:  [5 cmH20] 5 cmH20 Plateau Pressure:  [24 cmH20-28 cmH20] 24 cmH20   Intake/Output Summary (Last 24 hours) at 03/17/2023 0903 Last data filed at 02/28/2023 0600 Gross per 24 hour  Intake 4319.06 ml  Output 553 ml  Net 3766.06 ml    Filed Weights   02/27/23 0500 02/28/23 1249 03/18/2023 0705  Weight: 118.2 kg 124.6 kg 124.2 kg   Physical Examination:  General: overweight elderly male in NAD on vent HEENT: [place]/AT, anicteric sclera, PERRL 6m, moist mucous membranes. ETT/OGT in place. Neuro: Sedated.  Not following commands.  +Corneal, +Cough, and +Gag  CV: RRR, no  m/g/r. PULM: Breathing even and unlabored on vent. Course breath sounds. GI: Obese, soft, nontender, mild-moderate distention. Hypoactive bowel sounds. Extremities: Bilateral symmetric 1+ LE edema noted. Amputations of toes of R foot (great toe + 2-4th digit), 5th digit intact with mild nail overgrowth/ulceration - no fluctuance. Skin: Warm/dry, no rashes.  Resolved Hospital Problem List:     Assessment & Plan:   Acute PCA stroke s/p endovascular revascularization/balloon angioplasty, no stent - Neuro/Stroke following, appreciate recommendations - NIR following post-procedure - Goal SBP liberalized to < 180 now >24H post - Wean sedation as able for neuro exam post-MRI - Frequent neuro checks - Neuroprotective measures: HOB > 30 degrees, normoglycemia, normothermia, electrolytes WNL - PT/OT/SLP when able to participate in care  Acute hypoxemic respiratory failure, need for mechanical ventilation in the setting of AMS Now volume overloaded with pleural effusions also contributing. Has not responded to lasix trial.  - Continue full vent support (4-8cc/kg IBW) - Wean FiO2 for O2 sat > 90% - Daily WUA/SBT once appropriate from a mental status standpoint - VAP bundle - Pulmonary hygiene - Nephrology consulted for consideration of HD and volume removal. Family is interested in additional goals of care discussions, so  will hold off HD for now.  - PAD protocol for sedation: Precedex, Fentanyl, and Versed for goal RASS -1 to -2   DKA: gap closed, insulin drip off T2DM - Semglee 40U daily (home dose 100U) - TF coverage 4U Q4H - SSI - CBGs Q4H, goal 140-180  Elevated troponin: Troponin elevation likely demand-related, cannot r/o ACS given cardiac history CAD s/p CABG x 3 (2017) Acute on chronic diastolic heart failure: Echo with EF 50-55%, +RWMAs, mid concentric LVH Paroxysmal atrial fibrillation - Cardiology consulted, appreciate recommendations - Continue statin - Hold ASA - Hold  Eliquis in the setting of subcapsular hematoma/RP bleed, will need to revisit AC once more stable - Cardiac monitoring - Optimize lytes as able (K > 4, Mg > 2)  Oliguric AKI in setting of stage II CKD, also with subcapsular hematoma, ?Page kidney physiology on right Possible atrophic left kidney Hyperkalemia, improved Some concern for Page kidney physiology, given known R subcapsular hematoma and initially difficult-to-control hypertension despite Cleviprex. - Trend BM - Replete electrolytes as indicated - Monitor I&Os - Avoid nephrotoxic agents as able - Ensure adequate renal perfusion - D/w Nephro 3/1, recommend Albumin 25g x 3 and Lasix challenge. Will challenge again today and add bicarb drip at 64m/hr.Called back 3/4 to consider HD.  - Consider further imaging with ultrasound/doppler if there is ongoing desire for aggressive care.  - D/w IR, even if Page kidney - unable to intervene due to typical viscosity of blood products. Discussed with vascular surgery as well, no option for surgical intervention.   Leukocytosis, likely reactive in the setting of multiple insults ?Punctate foci of gas near bleed - Trend WBC, fever curve - Continue ceftriaxone, Flagyl, vanc (MRSA screen +) - Continue to monitor  Thrombocytopenia - monitor - check DIC panel  ABLA in the setting of subcapsular hematoma/RP bleed [facility] - Trend H&H - Monitor for signs of active bleeding - Transfuse for Hgb < 8.0 (given cardiac history) or hemodynamically significant bleeding - Eliquis on hold - If H&H drifting, low threshold to repeat abdominal imaging  Right 11th and 12th rib fractures - Supportive care/pain control  Concern for infected fifth toe, right foot - x-ray without soft tissue involvement - continue to monitor via physical exam  Constipation - received relistor 3/2 - continue miralax and docusate BID - give SMOG enema 3/3 with good response. -  Continue senokot - Belly still firm, continue bowel regimen.   Best Practice: (right click and "Reselect all SmartList Selections" daily)   Diet/type: tubefeeds DVT prophylaxis: SCD GI prophylaxis: PPI Lines: N/A Foley:  Yes, and it is still needed Code Status:  full code Last date of multidisciplinary goals of care discussion [3/1 - D/w daughter, DArrie Aran at bedside - remains full code at present.]  Critical care time:  5[place] AGACNP-BC LStewartvillefor personal pager PCCM on call pager ([phone] 7pm. Please call Elink 7p-7a. [phone] 03/20/2023 9:10 AM

## 2023-03-29 DEATH — deceased
